# Patient Record
Sex: Male | Born: 1950 | Race: White | Hispanic: No | Marital: Married | Smoking: Former smoker
Health system: Southern US, Community
[De-identification: ages and names within clinical notes are randomized; demographics above are authoritative.]

## PROBLEM LIST (undated history)

## (undated) DIAGNOSIS — E785 Hyperlipidemia, unspecified: Secondary | ICD-10-CM

## (undated) DIAGNOSIS — E559 Vitamin D deficiency, unspecified: Secondary | ICD-10-CM

## (undated) DIAGNOSIS — K219 Gastro-esophageal reflux disease without esophagitis: Secondary | ICD-10-CM

## (undated) DIAGNOSIS — K5730 Diverticulosis of large intestine without perforation or abscess without bleeding: Secondary | ICD-10-CM

## (undated) DIAGNOSIS — D649 Anemia, unspecified: Secondary | ICD-10-CM

## (undated) DIAGNOSIS — H3530 Unspecified macular degeneration: Secondary | ICD-10-CM

## (undated) DIAGNOSIS — J449 Chronic obstructive pulmonary disease, unspecified: Secondary | ICD-10-CM

## (undated) DIAGNOSIS — I10 Essential (primary) hypertension: Secondary | ICD-10-CM

## (undated) DIAGNOSIS — K210 Gastro-esophageal reflux disease with esophagitis: Secondary | ICD-10-CM

## (undated) DIAGNOSIS — F17200 Nicotine dependence, unspecified, uncomplicated: Secondary | ICD-10-CM

## (undated) DIAGNOSIS — I639 Cerebral infarction, unspecified: Secondary | ICD-10-CM

## (undated) DIAGNOSIS — Z8551 Personal history of malignant neoplasm of bladder: Secondary | ICD-10-CM

## (undated) DIAGNOSIS — K6289 Other specified diseases of anus and rectum: Secondary | ICD-10-CM

## (undated) DIAGNOSIS — M1990 Unspecified osteoarthritis, unspecified site: Secondary | ICD-10-CM

## (undated) DIAGNOSIS — R918 Other nonspecific abnormal finding of lung field: Secondary | ICD-10-CM

## (undated) DIAGNOSIS — K2940 Chronic atrophic gastritis without bleeding: Secondary | ICD-10-CM

## (undated) DIAGNOSIS — C801 Malignant (primary) neoplasm, unspecified: Secondary | ICD-10-CM

## (undated) DIAGNOSIS — R011 Cardiac murmur, unspecified: Secondary | ICD-10-CM

## (undated) DIAGNOSIS — Z923 Personal history of irradiation: Secondary | ICD-10-CM

## (undated) HISTORY — DX: Gastro-esophageal reflux disease with esophagitis: K21.0

## (undated) HISTORY — DX: Gastro-esophageal reflux disease without esophagitis: K21.9

## (undated) HISTORY — DX: Essential (primary) hypertension: I10

## (undated) HISTORY — DX: Malignant (primary) neoplasm, unspecified: C80.1

## (undated) HISTORY — DX: Diverticulosis of large intestine without perforation or abscess without bleeding: K57.30

## (undated) HISTORY — DX: Chronic atrophic gastritis without bleeding: K29.40

## (undated) HISTORY — DX: Nicotine dependence, unspecified, uncomplicated: F17.200

## (undated) HISTORY — PX: TONSILLECTOMY: SUR1361

## (undated) HISTORY — DX: Anemia, unspecified: D64.9

## (undated) HISTORY — DX: Unspecified osteoarthritis, unspecified site: M19.90

## (undated) HISTORY — DX: Cerebral infarction, unspecified: I63.9

## (undated) HISTORY — PX: WISDOM TOOTH EXTRACTION: SHX21

## (undated) HISTORY — PX: UPPER GASTROINTESTINAL ENDOSCOPY: SHX188

## (undated) HISTORY — DX: Vitamin D deficiency, unspecified: E55.9

## (undated) HISTORY — DX: Hyperlipidemia, unspecified: E78.5

## (undated) HISTORY — PX: COLONOSCOPY: SHX174

## (undated) HISTORY — DX: Personal history of malignant neoplasm of bladder: Z85.51

---

## 1993-04-16 HISTORY — PX: NASAL SEPTUM SURGERY: SHX37

## 2000-04-16 DIAGNOSIS — Z8551 Personal history of malignant neoplasm of bladder: Secondary | ICD-10-CM

## 2000-04-16 HISTORY — DX: Personal history of malignant neoplasm of bladder: Z85.51

## 2000-04-16 HISTORY — PX: TRANSURETHRAL RESECTION OF BLADDER TUMOR: SHX2575

## 2001-06-23 ENCOUNTER — Ambulatory Visit (HOSPITAL_BASED_OUTPATIENT_CLINIC_OR_DEPARTMENT_OTHER): Admission: RE | Admit: 2001-06-23 | Discharge: 2001-06-23 | Payer: Self-pay | Admitting: *Deleted

## 2001-06-23 ENCOUNTER — Encounter (INDEPENDENT_AMBULATORY_CARE_PROVIDER_SITE_OTHER): Payer: Self-pay | Admitting: Specialist

## 2001-06-23 ENCOUNTER — Encounter: Payer: Self-pay | Admitting: Urology

## 2001-06-23 ENCOUNTER — Encounter (INDEPENDENT_AMBULATORY_CARE_PROVIDER_SITE_OTHER): Payer: Self-pay

## 2001-07-01 ENCOUNTER — Encounter: Payer: Self-pay | Admitting: Urology

## 2001-07-01 ENCOUNTER — Encounter: Admission: RE | Admit: 2001-07-01 | Discharge: 2001-07-01 | Payer: Self-pay | Admitting: Urology

## 2001-07-02 ENCOUNTER — Ambulatory Visit (HOSPITAL_BASED_OUTPATIENT_CLINIC_OR_DEPARTMENT_OTHER): Admission: RE | Admit: 2001-07-02 | Discharge: 2001-07-02 | Payer: Self-pay | Admitting: Urology

## 2002-01-02 ENCOUNTER — Encounter: Payer: Self-pay | Admitting: Urology

## 2002-01-02 ENCOUNTER — Encounter: Admission: RE | Admit: 2002-01-02 | Discharge: 2002-01-02 | Payer: Self-pay | Admitting: Urology

## 2002-01-12 ENCOUNTER — Encounter (INDEPENDENT_AMBULATORY_CARE_PROVIDER_SITE_OTHER): Payer: Self-pay | Admitting: Specialist

## 2002-01-12 ENCOUNTER — Ambulatory Visit (HOSPITAL_BASED_OUTPATIENT_CLINIC_OR_DEPARTMENT_OTHER): Admission: RE | Admit: 2002-01-12 | Discharge: 2002-01-12 | Payer: Self-pay | Admitting: Urology

## 2002-05-22 ENCOUNTER — Encounter (INDEPENDENT_AMBULATORY_CARE_PROVIDER_SITE_OTHER): Payer: Self-pay | Admitting: *Deleted

## 2002-05-22 ENCOUNTER — Ambulatory Visit (HOSPITAL_BASED_OUTPATIENT_CLINIC_OR_DEPARTMENT_OTHER): Admission: RE | Admit: 2002-05-22 | Discharge: 2002-05-22 | Payer: Self-pay | Admitting: Urology

## 2003-08-05 ENCOUNTER — Encounter: Payer: Self-pay | Admitting: Internal Medicine

## 2004-03-02 ENCOUNTER — Ambulatory Visit: Payer: Self-pay | Admitting: Internal Medicine

## 2004-03-30 ENCOUNTER — Ambulatory Visit: Payer: Self-pay | Admitting: Internal Medicine

## 2004-05-03 ENCOUNTER — Ambulatory Visit: Payer: Self-pay | Admitting: Internal Medicine

## 2004-05-17 ENCOUNTER — Ambulatory Visit: Payer: Self-pay | Admitting: Internal Medicine

## 2004-06-01 ENCOUNTER — Ambulatory Visit: Payer: Self-pay | Admitting: Internal Medicine

## 2004-06-01 DIAGNOSIS — K2940 Chronic atrophic gastritis without bleeding: Secondary | ICD-10-CM

## 2004-06-01 DIAGNOSIS — K2100 Gastro-esophageal reflux disease with esophagitis, without bleeding: Secondary | ICD-10-CM

## 2004-06-01 HISTORY — DX: Chronic atrophic gastritis without bleeding: K29.40

## 2004-06-01 HISTORY — DX: Gastro-esophageal reflux disease with esophagitis, without bleeding: K21.00

## 2004-10-03 ENCOUNTER — Emergency Department (HOSPITAL_COMMUNITY): Admission: EM | Admit: 2004-10-03 | Discharge: 2004-10-03 | Payer: Self-pay | Admitting: Emergency Medicine

## 2005-04-11 ENCOUNTER — Ambulatory Visit: Payer: Self-pay | Admitting: Internal Medicine

## 2005-04-19 ENCOUNTER — Ambulatory Visit: Payer: Self-pay | Admitting: Internal Medicine

## 2006-12-11 ENCOUNTER — Ambulatory Visit: Payer: Self-pay | Admitting: Internal Medicine

## 2007-01-09 DIAGNOSIS — K219 Gastro-esophageal reflux disease without esophagitis: Secondary | ICD-10-CM | POA: Insufficient documentation

## 2007-01-09 DIAGNOSIS — E785 Hyperlipidemia, unspecified: Secondary | ICD-10-CM

## 2007-01-09 HISTORY — DX: Gastro-esophageal reflux disease without esophagitis: K21.9

## 2007-01-09 HISTORY — DX: Hyperlipidemia, unspecified: E78.5

## 2007-01-14 DIAGNOSIS — K5730 Diverticulosis of large intestine without perforation or abscess without bleeding: Secondary | ICD-10-CM

## 2007-01-14 DIAGNOSIS — M1990 Unspecified osteoarthritis, unspecified site: Secondary | ICD-10-CM | POA: Insufficient documentation

## 2007-01-14 HISTORY — DX: Diverticulosis of large intestine without perforation or abscess without bleeding: K57.30

## 2007-01-14 HISTORY — DX: Unspecified osteoarthritis, unspecified site: M19.90

## 2007-05-21 ENCOUNTER — Ambulatory Visit: Payer: Self-pay | Admitting: Internal Medicine

## 2007-05-21 LAB — CONVERTED CEMR LAB
ALT: 24 units/L (ref 0–53)
AST: 21 units/L (ref 0–37)
Albumin: 4 g/dL (ref 3.5–5.2)
Alkaline Phosphatase: 71 units/L (ref 39–117)
BUN: 16 mg/dL (ref 6–23)
Basophils Absolute: 0 10*3/uL (ref 0.0–0.1)
Basophils Relative: 0.1 % (ref 0.0–1.0)
Bilirubin Urine: NEGATIVE
Bilirubin, Direct: 0.2 mg/dL (ref 0.0–0.3)
Blood in Urine, dipstick: NEGATIVE
CO2: 29 meq/L (ref 19–32)
Calcium: 9.4 mg/dL (ref 8.4–10.5)
Chloride: 104 meq/L (ref 96–112)
Cholesterol: 181 mg/dL (ref 0–200)
Creatinine, Ser: 0.8 mg/dL (ref 0.4–1.5)
Direct LDL: 86.8 mg/dL
Eosinophils Absolute: 0.1 10*3/uL (ref 0.0–0.6)
Eosinophils Relative: 1.4 % (ref 0.0–5.0)
GFR calc Af Amer: 128 mL/min
GFR calc non Af Amer: 106 mL/min
Glucose, Bld: 98 mg/dL (ref 70–99)
Glucose, Urine, Semiquant: NEGATIVE
HCT: 45.1 % (ref 39.0–52.0)
HDL: 26.4 mg/dL — ABNORMAL LOW (ref >39.0)
Hemoglobin: 15.4 g/dL (ref 13.0–17.0)
Ketones, urine, test strip: NEGATIVE
Lymphocytes Relative: 25.7 % (ref 12.0–46.0)
MCHC: 34.3 g/dL (ref 30.0–36.0)
MCV: 97.5 fL (ref 78.0–100.0)
Monocytes Absolute: 0.3 10*3/uL (ref 0.2–0.7)
Monocytes Relative: 4.9 % (ref 3.0–11.0)
Neutro Abs: 4.5 10*3/uL (ref 1.4–7.7)
Neutrophils Relative %: 67.9 % (ref 43.0–77.0)
Nitrite: NEGATIVE
PSA: 0.5 ng/mL (ref 0.10–4.00)
Platelets: 136 10*3/uL — ABNORMAL LOW (ref 150–400)
Potassium: 4.4 meq/L (ref 3.5–5.1)
RBC: 4.62 M/uL (ref 4.22–5.81)
RDW: 13 % (ref 11.5–14.6)
Sodium: 140 meq/L (ref 135–145)
Specific Gravity, Urine: 1.025
TSH: 1 microintl units/mL (ref 0.35–5.50)
Total Bilirubin: 0.9 mg/dL (ref 0.3–1.2)
Total CHOL/HDL Ratio: 6.9
Total Protein: 6.4 g/dL (ref 6.0–8.3)
Triglycerides: 345 mg/dL (ref 0–149)
Urobilinogen, UA: 0.2
VLDL: 69 mg/dL — ABNORMAL HIGH (ref 0–40)
WBC Urine, dipstick: NEGATIVE
WBC: 6.6 10*3/uL (ref 4.5–10.5)
pH: 6

## 2007-06-01 LAB — CONVERTED CEMR LAB: Vit D, 1,25-Dihydroxy: 18 — ABNORMAL LOW (ref 30–89)

## 2007-06-03 ENCOUNTER — Ambulatory Visit: Payer: Self-pay | Admitting: Internal Medicine

## 2007-06-03 DIAGNOSIS — E559 Vitamin D deficiency, unspecified: Secondary | ICD-10-CM | POA: Insufficient documentation

## 2007-06-03 HISTORY — DX: Vitamin D deficiency, unspecified: E55.9

## 2007-07-18 DIAGNOSIS — Z8551 Personal history of malignant neoplasm of bladder: Secondary | ICD-10-CM | POA: Insufficient documentation

## 2007-12-24 ENCOUNTER — Telehealth: Payer: Self-pay | Admitting: Internal Medicine

## 2008-07-20 HISTORY — PX: KNEE SURGERY: SHX244

## 2009-01-31 ENCOUNTER — Telehealth: Payer: Self-pay | Admitting: Internal Medicine

## 2009-02-09 ENCOUNTER — Ambulatory Visit: Payer: Self-pay | Admitting: Internal Medicine

## 2009-02-09 LAB — CONVERTED CEMR LAB
Albumin: 4.1 g/dL (ref 3.5–5.2)
Basophils Absolute: 0.1 10*3/uL (ref 0.0–0.1)
CO2: 29 meq/L (ref 19–32)
Calcium: 9.2 mg/dL (ref 8.4–10.5)
Chloride: 104 meq/L (ref 96–112)
Direct LDL: 84.2 mg/dL
Eosinophils Absolute: 0.2 10*3/uL (ref 0.0–0.7)
Glucose, Bld: 110 mg/dL — ABNORMAL HIGH (ref 70–99)
HCT: 46.6 % (ref 39.0–52.0)
Hemoglobin: 15.9 g/dL (ref 13.0–17.0)
Ketones, urine, test strip: NEGATIVE
Lymphs Abs: 1.9 10*3/uL (ref 0.7–4.0)
MCHC: 34.2 g/dL (ref 30.0–36.0)
MCV: 100.5 fL — ABNORMAL HIGH (ref 78.0–100.0)
Monocytes Absolute: 0.4 10*3/uL (ref 0.1–1.0)
Neutro Abs: 4.6 10*3/uL (ref 1.4–7.7)
Nitrite: NEGATIVE
Potassium: 4.6 meq/L (ref 3.5–5.1)
RDW: 12.7 % (ref 11.5–14.6)
Sodium: 139 meq/L (ref 135–145)
Specific Gravity, Urine: 1.025
TSH: 0.88 microintl units/mL (ref 0.35–5.50)
Total Protein: 7.1 g/dL (ref 6.0–8.3)
Triglycerides: 378 mg/dL — ABNORMAL HIGH (ref 0.0–149.0)

## 2009-02-16 ENCOUNTER — Ambulatory Visit: Payer: Self-pay | Admitting: Internal Medicine

## 2009-02-16 DIAGNOSIS — F17200 Nicotine dependence, unspecified, uncomplicated: Secondary | ICD-10-CM

## 2009-02-16 HISTORY — DX: Nicotine dependence, unspecified, uncomplicated: F17.200

## 2009-03-18 ENCOUNTER — Ambulatory Visit: Payer: Self-pay | Admitting: Internal Medicine

## 2009-04-21 ENCOUNTER — Telehealth: Payer: Self-pay | Admitting: Internal Medicine

## 2010-03-23 ENCOUNTER — Ambulatory Visit: Payer: Self-pay | Admitting: Internal Medicine

## 2010-03-23 LAB — CONVERTED CEMR LAB
ALT: 20 units/L (ref 0–53)
Albumin: 4.1 g/dL (ref 3.5–5.2)
BUN: 19 mg/dL (ref 6–23)
Basophils Relative: 0.3 % (ref 0.0–3.0)
Bilirubin Urine: NEGATIVE
Bilirubin, Direct: 0.1 mg/dL (ref 0.0–0.3)
CO2: 29 meq/L (ref 19–32)
Chloride: 102 meq/L (ref 96–112)
Cholesterol: 204 mg/dL — ABNORMAL HIGH (ref 0–200)
Creatinine, Ser: 0.6 mg/dL (ref 0.4–1.5)
Eosinophils Absolute: 0.1 10*3/uL (ref 0.0–0.7)
Eosinophils Relative: 1.5 % (ref 0.0–5.0)
HCT: 46.8 % (ref 39.0–52.0)
Ketones, ur: NEGATIVE mg/dL
Leukocytes, UA: NEGATIVE
Lymphs Abs: 2.4 10*3/uL (ref 0.7–4.0)
MCHC: 35.1 g/dL (ref 30.0–36.0)
MCV: 97.9 fL (ref 78.0–100.0)
Monocytes Absolute: 0.4 10*3/uL (ref 0.1–1.0)
Neutrophils Relative %: 68.6 % (ref 43.0–77.0)
Nitrite: NEGATIVE
PSA: 0.8 ng/mL (ref 0.10–4.00)
Potassium: 4.5 meq/L (ref 3.5–5.1)
RBC: 4.78 M/uL (ref 4.22–5.81)
TSH: 1.14 microintl units/mL (ref 0.35–5.50)
Total Protein: 6.8 g/dL (ref 6.0–8.3)
Urobilinogen, UA: 0.2 (ref 0.0–1.0)
pH: 6 (ref 5.0–8.0)

## 2010-03-31 ENCOUNTER — Encounter: Payer: Self-pay | Admitting: Internal Medicine

## 2010-03-31 ENCOUNTER — Ambulatory Visit: Payer: Self-pay | Admitting: Internal Medicine

## 2010-03-31 DIAGNOSIS — R1013 Epigastric pain: Secondary | ICD-10-CM

## 2010-04-05 ENCOUNTER — Ambulatory Visit: Payer: Self-pay | Admitting: Cardiology

## 2010-05-18 NOTE — Assessment & Plan Note (Signed)
Summary: CPX // RS   Vital Signs:  Patient profile:   60 year old male Height:      69.25 inches Weight:      204 pounds BMI:     30.02 Temp:     98.3 degrees F oral BP sitting:   128 / 80  (right arm) Cuff size:   regular  Vitals Entered By: Duard Brady LPN (March 31, 2010 2:46 PM) CC: cpx - doing well  - mid abd pain x79mos on/off Is Patient Diabetic? No   Primary Care Jhoel Stieg:  Gordy Savers  MD  CC:  cpx - doing well  - mid abd pain x80mos on/off.  History of Present Illness:  60 year old patient who is seen today for a health maintenance examination.  Impression includes pancreas seen midabdominal pain over 6 months.  He does have a history of gastritis, but is on chronic omeprazole.  there's been no weight loss, anorexia, or other constitutional complaints  Preventive Screening-Counseling & Management  Alcohol-Tobacco     Smoking Cessation Counseling: yes  Allergies (verified): No Known Drug Allergies  Past History:  Past Medical History: Reviewed history from 03/18/2009 and no changes required. GERD at EGD 2006 gastritis (neg H. pylori) Hyperlipidemia Bladder CA Diverticulosis, colon Osteoarthritis vitamin D deficiency impaired glucose tolerance ED  Past Surgical History: Reviewed history from 02/16/2009 and no changes required. Knee surgery- bilateral colonoscopy 2005 cystoscopy March 2003  Family History: Reviewed history from 03/18/2009 and no changes required. father died age 22, MI, history of type 2 diabetes mother died 27 complications of senile  dementia one brother remains in good health No FH of Colon Cancer: Family History of Heart Disease: maternal grandfather  Social History: Reviewed history from 03/18/2009 and no changes required. Married Current Smoker 1/2 per day Alcohol Use - yes wine with dinner Daily Caffeine Use 2 cups per day Illicit Drug Use - no  Review of Systems  The patient denies anorexia, fever,  weight loss, weight gain, vision loss, decreased hearing, hoarseness, chest pain, syncope, dyspnea on exertion, peripheral edema, prolonged cough, headaches, hemoptysis, abdominal pain, melena, hematochezia, severe indigestion/heartburn, hematuria, incontinence, genital sores, muscle weakness, suspicious skin lesions, transient blindness, difficulty walking, depression, unusual weight change, abnormal bleeding, enlarged lymph nodes, angioedema, breast masses, and testicular masses.    Physical Exam  General:  overweight-appearing.  140/80overweight-appearing.   Head:  Normocephalic and atraumatic without obvious abnormalities. No apparent alopecia or balding. Eyes:  No corneal or conjunctival inflammation noted. EOMI. Perrla. Funduscopic exam benign, without hemorrhages, exudates or papilledema. Vision grossly normal. Ears:  External ear exam shows no significant lesions or deformities.  Otoscopic examination reveals clear canals, tympanic membranes are intact bilaterally without bulging, retraction, inflammation or discharge. Hearing is grossly normal bilaterally. Nose:  External nasal examination shows no deformity or inflammation. Nasal mucosa are pink and moist without lesions or exudates. Mouth:  Oral mucosa and oropharynx without lesions or exudates.  Teeth in good repair. Neck:  No deformities, masses, or tenderness noted. Chest Wall:  No deformities, masses, tenderness or gynecomastia noted. Breasts:  No masses or gynecomastia noted Lungs:  Normal respiratory effort, chest expands symmetrically. Lungs are clear to auscultation, no crackles or wheezes. Heart:  Normal rate and regular rhythm. S1 and S2 normal without gallop, murmur, click, rub or other extra sounds. Abdomen:  Bowel sounds positive,abdomen soft and non-tender without masses, organomegaly or hernias noted. Rectal:  No external abnormalities noted. Normal sphincter tone. No rectal masses or tenderness. Genitalia:  Testes  bilaterally descended without nodularity, tenderness or masses. No scrotal masses or lesions. No penis lesions or urethral discharge. Prostate:  2+ enlarged.  2+ enlarged.   Msk:  No deformity or scoliosis noted of thoracic or lumbar spine.   Pulses:  R and L carotid,radial,femoral,dorsalis pedis and posterior tibial pulses are full and equal bilaterally Extremities:  No clubbing, cyanosis, edema, or deformity noted with normal full range of motion of all joints.   Neurologic:  No cranial nerve deficits noted. Station and gait are normal. Plantar reflexes are down-going bilaterally. DTRs are symmetrical throughout. Sensory, motor and coordinative functions appear intact. Skin:  Intact without suspicious lesions or rashes Cervical Nodes:  No lymphadenopathy noted Axillary Nodes:  No palpable lymphadenopathy Inguinal Nodes:  No significant adenopathy Psych:  Cognition and judgment appear intact. Alert and cooperative with normal attention span and concentration. No apparent delusions, illusions, hallucinations   Impression & Recommendations:  Problem # 1:  PREVENTIVE HEALTH CARE (ICD-V70.0)  Orders: EKG w/ Interpretation (93000)  Complete Medication List: 1)  Lipitor 40 Mg Tabs (Atorvastatin calcium) .Marland Kitchen.. 1 once daily 2)  Niacin 500 Mg Tabs (Niacin) .... Take one by mouth at bedtime 3)  Multivitamins Tabs (Multiple vitamin) .... Take one by mouth once daily 4)  Ibuprofen 200 Mg Caps (Ibuprofen) .... As needed 5)  Cialis 20 Mg Tabs (Tadalafil) .... One daily as directed 6)  Omeprazole 40 Mg Cpdr (Omeprazole) .... Take 1 capsule by mouth once a day 7)  Cialis 5 Mg Tabs (Tadalafil) .... One daily  Other Orders: Radiology Referral (Radiology)  Patient Instructions: 1)  Please schedule a follow-up appointment in 1 year. 2)  Limit your Sodium (Salt). 3)  Avoid foods high in acid (tomatoes, citrus juices, spicy foods). Avoid eating within two hours of lying down or before exercising. Do not  over eat; try smaller more frequent meals. Elevate head of bed twelve inches when sleeping. 4)  Tobacco is very bad for your health and your loved ones! You Should stop smoking!. 5)  It is important that you exercise regularly at least 20 minutes 5 times a week. If you develop chest pain, have severe difficulty breathing, or feel very tired , stop exercising immediately and seek medical attention. 6)  Take calcium +Vitamin D daily. 1000-1500 units daily  Prescriptions: CIALIS 5 MG TABS (TADALAFIL) one daily  #30 x 0   Entered and Authorized by:   Gordy Savers  MD   Signed by:   Gordy Savers  MD on 03/31/2010   Method used:   [facility] to        [facility] [address] (retail)       [address]       [place], [place]  [address]       Ph: [phone] or [phone]       Fax: [phone]   RxID:   [id number] OMEPRAZOLE 40 MG CPDR (OMEPRAZOLE) Take 1 capsule by mouth once a day  #90 x 6   Entered and Authorized by:   Gordy Savers  MD   Signed by:   Gordy Savers  MD on 03/31/2010   Method used:   [facility]ically to        [facility]  [place] [address] (retail[facility]       [address]       [place], [place]  [address]       Ph: [phone] or [phone]  Fax: [phone]   RxID:   [id number] CIALIS 20 MG TABS (TADALAFIL) one daily as directed  #12 x 12   Entered and Authorized by:   Gordy Savers  MD   Signed by:   Gordy Savers  MD on 03/31/2010   Method used:   [facility]        [facility]  [place] [address] [facility]       [address]       [place], [place]  [address]       Ph: [phone] or [phone]       Fax: [phone]   RxID:   [id number] LIPITOR 40 MG TABS (ATORVASTATIN CALCIUM) 1 once daily  #90 x 6   Entered and Authorized by:   Gordy Savers  MD   Signed by:   Gordy Savers  MD on 03/31/2010   Method used:   [facility]        [facility]  [place] [address] [facility]retail[facility]       [address]        [place], [place]  [address]       Ph: [phone] or [phone]       Fax: [phone]   RxID:   [id number]    Orders Added: 1)  EKG w/ Interpretation [93000] 2)  Est. Patient 40-64 years [[address]] 3)  Radiology Referral [Radiology]

## 2010-05-18 NOTE — Progress Notes (Signed)
Summary: Change pantoprazole to omeprazole due to ins.   Phone Note Call from Patient Call back at [phone]   Caller: Patient Call For: Dr. Leone Payor Reason for Call: Refill Medication Summary of Call: Pt was prescribed Pantoprazole and Insurance will not pay for it. The pharmacy is recommending Omeprazole to replace it Initial call taken by: Karna Christmas,  April 21, 2009 9:43 AM  Follow-up for Phone Call        I spoke to pt who is OK to switch to Omeprazole.  Pt requests 40mg .  I advised pt insurance may not pay for 40mg .  Pt will call next week if insurance will not pay for omeprazole 40mg . Follow-up by: Francee Piccolo CMA Duncan Dull),  April 21, 2009 4:20 PM    New/Updated Medications: OMEPRAZOLE 40 MG CPDR (OMEPRAZOLE) Take 1 capsule by mouth once a day Prescriptions: OMEPRAZOLE 40 MG CPDR (OMEPRAZOLE) Take 1 capsule by mouth once a day  #30 x 6   Entered by:   Francee Piccolo CMA (AAMA)   Authorized by:   Iva Boop MD, [facility]   Signed by:   Francee Piccolo CMA (AAMA) on 04/21/2009   Method used:   [facility]        [facility]  [place] [address]       [address]       [place], [place]  [address]       Ph: [phone] or [phone]       Fax: [phone]   RxID:   [id number]

## 2010-08-29 NOTE — Assessment & Plan Note (Signed)
[facility]                         GASTROENTEROLOGY OFFICE NOTE   PRISCILLA, FINKLEA                      MRN:          [id number]  DATE:12/11/2006                            DOB:          [birth year]    CHIEF COMPLAINT:  Gastroesophageal reflux disease.  Needs refill on  Prevacid.   PROBLEMS:  1. Reflux esophagitis, chronic gastritis found endoscopy June 01, 2004.  2. Smoker.  3. Negative screening colonoscopy with minimal sigmoid diverticulosis      April of 2005.  4. Osteoarthritis.  5. History of bladder cancer.  6. Left knee surgery.  7. Dyslipidemia.   MEDICATIONS:  Listed and reviewed on the chart.   He denies any heartburn, dysphagia, weight loss, or problems.  He is  using Prevacid every other day with excellent control of his symptoms.  Weight 198 pounds.  Pulse 80, blood pressure 132/74.   He still smokes.  Only one cup of coffee a day.   ASSESSMENT:  1. Gastroesophageal reflux disease, reflux esophagitis.  2. Smoker.   RECOMMENDATIONS AND PLAN:  1. Refill the Prevacid.  As long as he does not have dysphagia,      heartburn, or breakthrough problems, we can refill this for 2 years      at this time I think.  2. He should stop smoking.  He knows this.  He says he has cut back.      He is going to discuss Chantix with Dr. Amador Cunas at his next      visit.     Iva Boop, MD,FACG  Electronically Signed    CEG/MedQ  DD: 12/11/2006  DT: 12/12/2006  Job #: [id number]   cc:   Gordy Savers, MD

## 2010-09-01 NOTE — Op Note (Signed)
[facility]  Patient:    Jesse [name], Jesse [name] Visit Number: [id number] MRN: [id number]          Service Type: NES Location: [facility] Attending Physician:  Lauree Chandler Dictated by:   Maretta Bees. Vonita Moss, M.D. Proc. Date: 07/02/01 Admit Date:  06/23/2001 Discharge Date: 06/23/2001                             Operative Report  PREOPERATIVE DIAGNOSIS:  Left hydronephrosis.  POSTOPERATIVE DIAGNOSIS:  Left hydronephrosis.  PROCEDURE: 1. Cystoscopy. 2. Left retrograde pyelogram with interpretation. 3. Insertion of left Double J catheter.  SURGEON:  Maretta Bees. Vonita Moss, M.D.  ANESTHESIA:  General.  INDICATIONS:  This 60 year old gentleman had gross hematuria for several days and underwent cystoscopy and resection of a small bladder tumor just beyond the left ureteral orifice on the bladder floor and also underwent retrograde pyelograms and ended up with a left ureteroscopy because of an area that looked irregular in the distal left ureter and with balloon dilation, he had a small area in the distal left ureter that caused a persistent waisting in the balloon dilator. Urine cytologies were negative. He had his Double J catheter removed a couple of days postoperatively and he has had a week of left flank pain requiring Tylox for relief. In the office yesterday, March 18, he had an ultrasound that showed hydronephrosis of the left kidney and then a CT scan was done which showed left hydronephrosis and what appeared to be edema in the bladder near the distal left ureter. Because of pain and persistent obstruction and concern about a ureteral stricture, I brought him back to the OR today.  DESCRIPTION OF PROCEDURE:  The patient was brought to the operating room and placed in lithotomy position. He was cystoscoped and there was tremendous inflammation and edema around the left ureteral orifice and around the biopsy which is nearby. I used a Glidewire M to  catheterize the left ureteral orifice and then an open-ended ureteral catheter was inserted over the guidewire and a retrograde pyelogram confirmed moderate hydronephrosis. I then inserted a metal guidewire. With the metal guidewire in place, I inserted a 26 cm 6-French Endo-Sof ureteral Double J catheter. It had a full coil in the renal pelvis and a full coil in the bladder. The bladder was emptied, the scope removed, and the patient sent to the recovery room in good condition. I feel like the obstruction was superficial due to mucosal edema in the bladder wall, and hopefully, not due to a ureteral stricture. Plan to leave the Double J catheter in for several days. Dictated by:   Maretta Bees. Vonita Moss, M.D. Attending Physician:  Lauree Chandler DD:  07/02/01 TD:  07/02/01 Job: [id number]

## 2010-09-01 NOTE — Op Note (Signed)
   NAME:  Jesse [name], Jesse [name]                         ACCOUNT NO.:  [id number]   MEDICAL RECORD NO.:  [id number]                   PATIENT TYPE:  AMB   LOCATION:  [facility]                                 FACILITY:  [facility]   PHYSICIAN:  Maretta Bees. Vonita Moss, M.D.             DATE OF BIRTH:  [birth year]   DATE OF PROCEDURE:  05/22/2002  DATE OF DISCHARGE:                                 OPERATIVE REPORT   PREOPERATIVE DIAGNOSIS:  Rule out bladder carcinoma.   POSTOPERATIVE DIAGNOSIS:  Rule out bladder carcinoma.   PROCEDURE:  1. Cystoscopy.  2. Cold cup bladder biopsy.   SURGEON:  Maretta Bees. Vonita Moss, M.D.   ANESTHESIA:  General.   INDICATIONS:  This 60 year old white male had a bladder carcinoma resected  in March 2003, and has subsequently undergone extensive work-up with CT  scans, retrograde pyelograms, and ureteroscopies looking for upper tract  disease, but all biopsies and FISH tests have been negative in the upper  tracts, although he has had positive FISH tests from the bladder.  He is  brought to the OR today for further evaluation and random biopsies of the  bladder.   DESCRIPTION OF PROCEDURE:  The patient was brought to the operating room and  placed in lithotomy position.  External genitalia were prepped and draped in  the usual fashion.  He was cystoscoped.  The anterior urethra was normal.  The prostatic urethra was unremarkable.  The bladder was completely  negative, but I felt obligated to do biopsies of the bladder despite that  because of the positive FISH test, and therefore I did random bladder  biopsies with the cold cup bladder biopsy forceps from the dome, base, and  right and left walls for a total of four biopsies.  The biopsy sites were  fulgurated with the Bugbee electrode.  There was no blood loss and good  hemostasis, and the scope was removed and the patient sent to the recovery  room in good condition.                                               Maretta Bees.  Vonita Moss, M.D.    LJP/MEDQ  D:  05/22/2002  T:  05/22/2002  Job:  [id number]

## 2010-09-01 NOTE — Op Note (Signed)
[facility]  Patient:    ARMANY, MANO Visit Number: [id number] MRN: [id number]          Service Type: NES Location: [facility] Attending Physician:  Lauree Chandler Dictated by:   Maretta Bees. Vonita Moss, M.D. Proc. Date: 06/23/01 Admit Date:  06/23/2001   CC:         Gordy Savers, M.D.   Operative Report  PREOPERATIVE DIAGNOSES: 1. Hematuria. 2. Bladder carcinoma. 3. Rule out ureteral carcinoma.  POSTOPERATIVE DIAGNOSES: 1. Hematuria. 2. Bladder carcinoma. 3. Rule out ureteral carcinoma. 4. Left ureteral stricture.  SURGEON:  Maretta Bees. Vonita Moss, M.D.  ANESTHESIA:  General.  INDICATIONS:  This 60 year old white male has had a long history of gross hematuria for several days before I saw him on June 18, 2001.  Renal ultrasound revealed no obvious upper tract lesions.  He underwent cystoscopy, and a small papillary tumor on the left bladder floor was noted.  He was brought to the OR today for further evaluation, and obtained retrograde pyelograms and a bladder biopsy.  DESCRIPTION OF PROCEDURE:  The patient was brought to the operating room and placed in the lithotomy position.  External genitalia were prepped and draped in the usual fashion.  He was cystoscoped, and the anterior and prostatic urethra were unremarkable.  The only lesion in the bladder was a small papillary tumor on the left floor just beyond the trigone which was removed completely with a cold cup bladder biopsy forceps and the base fulgurated with the Bugbee electrode.  I then performed a right retrograde pyelogram with the bulb tip catheter, and upper tracts were unremarkable.  No obstruction or filling defects.  I then performed a left retrograde pyelogram, and I was concerned about a short area of irregularity in the distal ureter. Pyelocaliceal system looked normal.  I inserted a guidewire up the left ureter without difficulty and then inserted the rigid  ureteroscope and about 3 cm into the ureter, there was a tight area that I could not negotiate.  I was unable to dilate this area with a ureteral access sheath.  I then used a 4 cm balloon dilator and even going up to 18-20 cm of water pressure, there was still some waisting at this area of irregularity.  I then reinserted the ureteroscope, and fortunately I had dilated this area enough to get beyond this tight area, and I saw no papillary tumors, stones, or any other lesions. Nevertheless, I obtained some urine for cytology from this region and decided to leave in a 6 French 26 cm double-J catheter with a string attached which was brought out per urethra.  The patient tolerated the procedure well. Dictated by:   Maretta Bees. Vonita Moss, M.D. Attending Physician:  Lauree Chandler DD:  06/23/01 TD:  06/23/01 Job: [id number]

## 2010-09-01 NOTE — Op Note (Signed)
NAME:  Jesse [name], Jesse [name]                         ACCOUNT NO.:  [id number]   MEDICAL RECORD NO.:  [id number]                   PATIENT TYPE:  AMB   LOCATION:  [facility]                                 FACILITY:  [facility]   PHYSICIAN:  Maretta Bees. Vonita Moss, M.D.             DATE OF BIRTH:  1950/11/02   DATE OF PROCEDURE:  01/12/2002  DATE OF DISCHARGE:                                 OPERATIVE REPORT   PREOPERATIVE DIAGNOSIS:  Rule out recurrent bladder carcinoma or upper tract  transitional cell carcinoma.   PROCEDURES:  1. Cystoscopy.  2. Cold-cup bladder biopsy.  3. Bilateral retrograde pyelograms with interpretation.  4. Bilateral collection of urines for cancer screening.  5. Bilateral ureteroscopy.  6. Bilateral double J catheter placement.   SURGEON:  Maretta Bees. Vonita Moss, M.D.   ANESTHESIA:  General.   INDICATIONS:  This 60 year old white male had a small bladder cancer removed  in March of this year, and he has been worked up extensively and especially  lately for some right flank pain and gross hematuria.  He had a question of  a distal left ureteral lesion in the past and recently had a positive  history of FISH test.  He is brought to the OR today for further evaluation  of his upper and lower urinary tracts.   DESCRIPTION OF PROCEDURE:  The patient was brought to the operating room and  placed in lithotomy position, and external genitalia were prepped and draped  in the usual fashion.  He was cystoscoped.  His anterior urethra was normal.  The prostate had a partial obstruction and a slightly high bladder neck.  The bladder had no erythematous lesions or papillary tumors, and he  subsequently underwent cold-cup bladder biopsy of each bladder wall, and  fulguration of these biopsy sites was completed.  He ended up undergoing  bilateral retrograde pyelograms that showed no filling defects or  obstruction of either pyelocalyceal system or ureter.  Before injection of  contrast was  performed using 5 [place] whistle-tip catheters in each renal  pelvis, saline washings were obtained differentially for FISH testing.  Bilateral ureteroscopies were performed, and specifically after placing a  guidewire up the left ureter, I used the 6 [place] rigid scope because there  was concern about distal left irregularity in the past and I wanted to scope  him without dilating the distal ureter as required at the previous  ureteroscopy.  I was able to negotiate the bony pelvic ureter with the rigid  ureteroscope, and there were no intraureteral polyps, papillary tumors, or  any other erythematous or worrisome lesions.  I could then negotiate the  rigid scope all the way up to the renal pelvis, so I inserted a guidewire  and at first could not insert the 6 [place] flexible scope over the guidewire  and I had to insert a ureteral dilating sheath, and I was able to  ureteroscope him  and there was no ureteral lesion, the renal pelvis had no  lesions in it, and I was able to scope almost all of his multiple calyces  and saw no lesions there.  When I put up the double J catheter later on, I  had to use a Glidewire to negotiate the UPJ, but I ended up placing a 6  [place], 26 cm double J catheter in good position with a string brought out  per urethra.  On the right side I had to dilate the distal ureter with the  dilating sheath.  It was tight, and I saw no ureteral lesions and got a good  view of the right pyelocalyceal system and no lesions were seen there.  I  did leave up a double J catheter on that side in addition, and the bladder  was then emptied, the scope removed, and both strings attached to the double  J catheters were brought out per urethra and taped to the penis.  He was  then taken to the recovery room in good condition.                                               Maretta Bees. Vonita Moss, M.D.    LJP/MEDQ  D:  01/12/2002  T:  01/13/2002  Job:  [id number]

## 2010-09-18 ENCOUNTER — Other Ambulatory Visit: Payer: Self-pay | Admitting: Internal Medicine

## 2010-11-01 ENCOUNTER — Other Ambulatory Visit: Payer: Self-pay | Admitting: Internal Medicine

## 2010-11-01 NOTE — Telephone Encounter (Signed)
Last seen 12/20122 - at that time cilais 5mg  on med list qd #30  0RF , this erequest is for 20mg   Please advise

## 2010-11-02 NOTE — Telephone Encounter (Signed)
ok 

## 2011-06-07 ENCOUNTER — Other Ambulatory Visit: Payer: Self-pay | Admitting: Internal Medicine

## 2011-06-28 ENCOUNTER — Other Ambulatory Visit (INDEPENDENT_AMBULATORY_CARE_PROVIDER_SITE_OTHER): Payer: 59

## 2011-06-28 DIAGNOSIS — Z0000 Encounter for general adult medical examination without abnormal findings: Secondary | ICD-10-CM

## 2011-06-28 LAB — CBC WITH DIFFERENTIAL/PLATELET
Basophils Absolute: 0 10*3/uL (ref 0.0–0.1)
Eosinophils Relative: 2.2 % (ref 0.0–5.0)
MCV: 97.8 fl (ref 78.0–100.0)
Monocytes Absolute: 0.3 10*3/uL (ref 0.1–1.0)
Monocytes Relative: 5.2 % (ref 3.0–12.0)
Neutrophils Relative %: 58.3 % (ref 43.0–77.0)
Platelets: 111 10*3/uL — ABNORMAL LOW (ref 150.0–400.0)
WBC: 5.6 10*3/uL (ref 4.5–10.5)

## 2011-06-28 LAB — LIPID PANEL
HDL: 45.7 mg/dL (ref >39.00)
Total CHOL/HDL Ratio: 4
VLDL: 31.4 mg/dL (ref 0.0–40.0)

## 2011-06-28 LAB — POCT URINALYSIS DIPSTICK
Bilirubin, UA: NEGATIVE
Glucose, UA: NEGATIVE
Leukocytes, UA: NEGATIVE
Nitrite, UA: NEGATIVE

## 2011-06-28 LAB — BASIC METABOLIC PANEL
BUN: 18 mg/dL (ref 6–23)
Creatinine, Ser: 0.8 mg/dL (ref 0.4–1.5)
GFR: 99.81 mL/min (ref >60.00)

## 2011-06-28 LAB — HEPATIC FUNCTION PANEL
ALT: 20 U/L (ref 0–53)
Bilirubin, Direct: 0.1 mg/dL (ref 0.0–0.3)
Total Bilirubin: 0.6 mg/dL (ref 0.3–1.2)

## 2011-06-28 LAB — PSA: PSA: 0.71 ng/mL (ref 0.10–4.00)

## 2011-06-29 ENCOUNTER — Encounter: Payer: Self-pay | Admitting: Internal Medicine

## 2011-07-05 ENCOUNTER — Encounter: Payer: Self-pay | Admitting: Internal Medicine

## 2011-07-05 ENCOUNTER — Ambulatory Visit (INDEPENDENT_AMBULATORY_CARE_PROVIDER_SITE_OTHER): Payer: 59 | Admitting: Internal Medicine

## 2011-07-05 VITALS — BP 120/80 | HR 82 | Temp 98.2°F | Resp 18 | Ht 69.0 in | Wt 189.0 lb

## 2011-07-05 DIAGNOSIS — M1990 Unspecified osteoarthritis, unspecified site: Secondary | ICD-10-CM

## 2011-07-05 DIAGNOSIS — Z0000 Encounter for general adult medical examination without abnormal findings: Secondary | ICD-10-CM

## 2011-07-05 DIAGNOSIS — F17200 Nicotine dependence, unspecified, uncomplicated: Secondary | ICD-10-CM

## 2011-07-05 DIAGNOSIS — E785 Hyperlipidemia, unspecified: Secondary | ICD-10-CM

## 2011-07-05 MED ORDER — ATORVASTATIN CALCIUM 40 MG PO TABS: 40.0000 mg | ORAL_TABLET | Freq: Every day | ORAL | Status: DC

## 2011-07-05 MED ORDER — OMEPRAZOLE 40 MG PO CPDR: 40.0000 mg | DELAYED_RELEASE_CAPSULE | Freq: Every day | ORAL | Status: DC

## 2011-07-05 MED ORDER — TADALAFIL 20 MG PO TABS: 20.0000 mg | ORAL_TABLET | Freq: Every day | ORAL | Status: DC | PRN

## 2011-07-05 NOTE — Progress Notes (Signed)
Subjective:    Patient ID: Jesse [name], male    DOB: 07-Oct-1950, 61 y.o.   MRN: [id number]  HPI  61 year old patient who is seen today for an annual health examination. He is doing quite well. He has been exercising regularly and moderate in his diet with some excellent weight loss. In general he feels quite well Laboratory studies were reviewed   Past Medical History:  Reviewed history from 03/18/2009 and no changes required.  GERD at EGD 2006  gastritis (neg H. pylori)  Hyperlipidemia  Bladder CA  Diverticulosis, colon  Osteoarthritis  vitamin D deficiency  impaired glucose tolerance  ED   Past Surgical History:  Reviewed history from 02/16/2009 and no changes required.  Knee surgery- bilateral  colonoscopy 2005  cystoscopy March 2003   Family History:  Reviewed history from 03/18/2009 and no changes required.  father died age 67, MI, history of type 2 diabetes  mother died 42 complications of senile dementia  one brother remains in good health  No FH of Colon Cancer:  Family History of Heart Disease: maternal grandfather   Social History:  Reviewed history from 03/18/2009 and no changes required.  Married  Current Smoker 1/2 per day  Alcohol Use - yes wine with dinner  Daily Caffeine Use 2 cups per day  Illicit Drug Use - no    Review of Systems  Constitutional: Negative for fever, chills, activity change, appetite change and fatigue.  HENT: Negative for hearing loss, ear pain, congestion, rhinorrhea, sneezing, mouth sores, trouble swallowing, neck pain, neck stiffness, dental problem, voice change, sinus pressure and tinnitus.   Eyes: Negative for photophobia, pain, redness and visual disturbance.  Respiratory: Negative for apnea, cough, choking, chest tightness, shortness of breath and wheezing.   Cardiovascular: Negative for chest pain, palpitations and leg swelling.  Gastrointestinal: Negative for nausea, vomiting, abdominal pain, diarrhea, constipation,  blood in stool, abdominal distention, anal bleeding and rectal pain.  Genitourinary: Negative for dysuria, urgency, frequency, hematuria, flank pain, decreased urine volume, discharge, penile swelling, scrotal swelling, difficulty urinating, genital sores and testicular pain.  Musculoskeletal: Negative for myalgias, back pain, joint swelling, arthralgias and gait problem.  Skin: Negative for color change, rash and wound.  Neurological: Negative for dizziness, tremors, seizures, syncope, facial asymmetry, speech difficulty, weakness, light-headedness, numbness and headaches.  Hematological: Negative for adenopathy. Does not bruise/bleed easily.  Psychiatric/Behavioral: Negative for suicidal ideas, hallucinations, behavioral problems, confusion, sleep disturbance, self-injury, dysphoric mood, decreased concentration and agitation. The patient is not nervous/anxious.        Objective:   Physical Exam  Constitutional: He appears well-developed and well-nourished.  HENT:  Head: Normocephalic and atraumatic.  Right Ear: External ear normal.  Left Ear: External ear normal.  Nose: Nose normal.  Mouth/Throat: Oropharynx is clear and moist.  Eyes: Conjunctivae and EOM are normal. Pupils are equal, round, and reactive to light. No scleral icterus.  Neck: Normal range of motion. Neck supple. No JVD present. No thyromegaly present.  Cardiovascular: Regular rhythm, normal heart sounds and intact distal pulses.  Exam reveals no gallop and no friction rub.   No murmur heard. Pulmonary/Chest: Effort normal and breath sounds normal. He exhibits no tenderness.  Abdominal: Soft. Bowel sounds are normal. He exhibits no distension and no mass. There is no tenderness.  Genitourinary: Prostate normal and penis normal. Guaiac negative stool.  Musculoskeletal: Normal range of motion. He exhibits no edema and no tenderness.  Lymphadenopathy:    He has no cervical adenopathy.  Neurological: He  is alert. He has  normal reflexes. No cranial nerve deficit. Coordination normal.  Skin: Skin is warm and dry. No rash noted.  Psychiatric: He has a normal mood and affect. His behavior is normal.          Assessment & Plan:   Preventive health examination Tobacco use. Total smoking cessation encouraged Dyslipidemia stable with improved HDL. Continue lifestyle management. The patient is exercising regularly and there has been some modest weight loss

## 2011-07-05 NOTE — Patient Instructions (Signed)
Limit your sodium (Salt) intake    It is important that you exercise regularly, at least 20 minutes 3 to 4 times per week.  If you develop chest pain or shortness of breath seek  medical attention.  Smoking tobacco is very bad for your health. You should stop smoking immediately.  Return in one year for follow-up  

## 2011-07-05 NOTE — Progress Notes (Signed)
  Subjective:    Patient ID: PANCHO RUSHING, male    DOB: February 02, 1951, 61 y.o.   MRN: [id number]  HPI Wt Readings from Last 3 Encounters:  07/05/11 189 lb (85.73 kg)  03/31/10 204 lb (92.534 kg)  03/18/09 206 lb 12.8 oz (93.804 kg)     Review of Systems     Objective:   Physical Exam        Assessment & Plan:

## 2012-07-17 ENCOUNTER — Other Ambulatory Visit: Payer: Self-pay | Admitting: Internal Medicine

## 2012-09-05 ENCOUNTER — Telehealth: Payer: Self-pay | Admitting: Internal Medicine

## 2012-09-05 MED ORDER — OMEPRAZOLE 40 MG PO CPDR: DELAYED_RELEASE_CAPSULE | ORAL | Status: DC

## 2012-09-05 MED ORDER — TADALAFIL 20 MG PO TABS: 20.0000 mg | ORAL_TABLET | Freq: Every day | ORAL | Status: DC | PRN

## 2012-09-05 MED ORDER — ATORVASTATIN CALCIUM 40 MG PO TABS: ORAL_TABLET | ORAL | Status: DC

## 2012-09-05 NOTE — Telephone Encounter (Signed)
Spoke to pt told him can only send enough refill till appt, must keep appt on 6/27. Pt verbalized understanding.

## 2012-09-05 NOTE — Telephone Encounter (Signed)
PT needs the following medications refilled prior to his 10/10/12 physical; omeprazole (PRILOSEC) 40 MG capsule, atorvastatin (LIPITOR) 40 MG tablet, and tadalafil (CIALIS) 20 MG tablet. He would like them called into the [facility] in [place]. Please assist.

## 2012-09-30 ENCOUNTER — Ambulatory Visit (INDEPENDENT_AMBULATORY_CARE_PROVIDER_SITE_OTHER): Payer: 59 | Admitting: Internal Medicine

## 2012-09-30 ENCOUNTER — Encounter: Payer: Self-pay | Admitting: Internal Medicine

## 2012-09-30 ENCOUNTER — Other Ambulatory Visit (INDEPENDENT_AMBULATORY_CARE_PROVIDER_SITE_OTHER): Payer: 59

## 2012-09-30 DIAGNOSIS — Z0000 Encounter for general adult medical examination without abnormal findings: Secondary | ICD-10-CM

## 2012-09-30 DIAGNOSIS — E785 Hyperlipidemia, unspecified: Secondary | ICD-10-CM

## 2012-09-30 LAB — CBC WITH DIFFERENTIAL/PLATELET
Basophils Absolute: 0 10*3/uL (ref 0.0–0.1)
Basophils Relative: 0.3 % (ref 0.0–3.0)
Eosinophils Absolute: 0.1 10*3/uL (ref 0.0–0.7)
MCHC: 34.3 g/dL (ref 30.0–36.0)
MCV: 97.6 fl (ref 78.0–100.0)
Monocytes Absolute: 0.4 10*3/uL (ref 0.1–1.0)
Neutro Abs: 5.2 10*3/uL (ref 1.4–7.7)
Neutrophils Relative %: 67.4 % (ref 43.0–77.0)
RBC: 4.71 Mil/uL (ref 4.22–5.81)
RDW: 14.3 % (ref 11.5–14.6)

## 2012-09-30 LAB — HEPATIC FUNCTION PANEL
ALT: 18 U/L (ref 0–53)
Alkaline Phosphatase: 67 U/L (ref 39–117)
Bilirubin, Direct: 0.1 mg/dL (ref 0.0–0.3)
Total Bilirubin: 0.7 mg/dL (ref 0.3–1.2)
Total Protein: 6.4 g/dL (ref 6.0–8.3)

## 2012-09-30 LAB — LIPID PANEL
HDL: 38.2 mg/dL — ABNORMAL LOW (ref >39.00)
Total CHOL/HDL Ratio: 5
VLDL: 57.4 mg/dL — ABNORMAL HIGH (ref 0.0–40.0)

## 2012-09-30 LAB — BASIC METABOLIC PANEL
CO2: 25 mEq/L (ref 19–32)
Chloride: 106 mEq/L (ref 96–112)
Creatinine, Ser: 0.8 mg/dL (ref 0.4–1.5)
Potassium: 4.8 mEq/L (ref 3.5–5.1)
Sodium: 143 mEq/L (ref 135–145)

## 2012-09-30 LAB — POCT URINALYSIS DIPSTICK
Bilirubin, UA: NEGATIVE
Ketones, UA: NEGATIVE
pH, UA: 6.5

## 2012-10-01 LAB — LDL CHOLESTEROL, DIRECT: Direct LDL: 105.6 mg/dL

## 2012-10-07 ENCOUNTER — Ambulatory Visit (INDEPENDENT_AMBULATORY_CARE_PROVIDER_SITE_OTHER): Payer: 59 | Admitting: Internal Medicine

## 2012-10-07 ENCOUNTER — Encounter: Payer: Self-pay | Admitting: Internal Medicine

## 2012-10-07 VITALS — BP 150/80 | HR 69 | Temp 98.6°F | Resp 20 | Ht 69.0 in | Wt 195.0 lb

## 2012-10-07 DIAGNOSIS — Z0000 Encounter for general adult medical examination without abnormal findings: Secondary | ICD-10-CM

## 2012-10-07 DIAGNOSIS — E785 Hyperlipidemia, unspecified: Secondary | ICD-10-CM

## 2012-10-07 DIAGNOSIS — F17200 Nicotine dependence, unspecified, uncomplicated: Secondary | ICD-10-CM

## 2012-10-07 DIAGNOSIS — Z23 Encounter for immunization: Secondary | ICD-10-CM

## 2012-10-07 DIAGNOSIS — M1990 Unspecified osteoarthritis, unspecified site: Secondary | ICD-10-CM

## 2012-10-07 NOTE — Patient Instructions (Addendum)
It is important that you exercise regularly, at least 20 minutes 3 to 4 times per week.  If you develop chest pain or shortness of breath seek  medical attention.  Smoking tobacco is very bad for your health. You should stop smoking immediately.  Return in one year for follow-up

## 2012-10-07 NOTE — Progress Notes (Signed)
Patient ID: Jesse [name], male   DOB: 04-09-1951, 62 y.o.   MRN: [id number]  Subjective:    Patient ID: Jesse [name], male    DOB: 07-10-1950, 62 y.o.   MRN: [id number]  HPI  62 year old patient who is seen today for an annual health examination. He is doing quite well. He has been exercising regularly and moderate in his diet with some excellent weight loss. In general he feels quite well Laboratory studies were reviewed  Wt Readings from Last 3 Encounters:  10/07/12 195 lb (88.451 kg)  07/05/11 189 lb (85.73 kg)  03/31/10 204 lb (92.534 kg)     Past Medical History:   GERD at EGD 2006  gastritis (neg H. pylori)  Hyperlipidemia  Bladder CA  Diverticulosis, colon  Osteoarthritis  vitamin D deficiency  impaired glucose tolerance  ED   Past Surgical History:   Knee surgery- bilateral  colonoscopy 2005  cystoscopy March 2003   Family History:   father died age 35, MI, history of type 2 diabetes  mother died 51 complications of senile dementia  one brother remains in good health  No FH of Colon Cancer:  Family History of Heart Disease: maternal grandfather   Social History:   Married  Current Smoker 1/2 per day  Alcohol Use - yes wine with dinner  Daily Caffeine Use 2 cups per day  Illicit Drug Use - no    Review of Systems  Constitutional: Negative for fever, chills, activity change, appetite change and fatigue.  HENT: Negative for hearing loss, ear pain, congestion, rhinorrhea, sneezing, mouth sores, trouble swallowing, neck pain, neck stiffness, dental problem, voice change, sinus pressure and tinnitus.   Eyes: Negative for photophobia, pain, redness and visual disturbance.  Respiratory: Negative for apnea, cough, choking, chest tightness, shortness of breath and wheezing.   Cardiovascular: Negative for chest pain, palpitations and leg swelling.  Gastrointestinal: Negative for nausea, vomiting, abdominal pain, diarrhea, constipation, blood in stool,  abdominal distention, anal bleeding and rectal pain.  Genitourinary: Negative for dysuria, urgency, frequency, hematuria, flank pain, decreased urine volume, discharge, penile swelling, scrotal swelling, difficulty urinating, genital sores and testicular pain.  Musculoskeletal: Negative for myalgias, back pain, joint swelling, arthralgias and gait problem.  Skin: Negative for color change, rash and wound.  Neurological: Negative for dizziness, tremors, seizures, syncope, facial asymmetry, speech difficulty, weakness, light-headedness, numbness and headaches.  Hematological: Negative for adenopathy. Does not bruise/bleed easily.  Psychiatric/Behavioral: Negative for suicidal ideas, hallucinations, behavioral problems, confusion, sleep disturbance, self-injury, dysphoric mood, decreased concentration and agitation. The patient is not nervous/anxious.        Objective:   Physical Exam  Constitutional: He appears well-developed and well-nourished.  HENT:  Head: Normocephalic and atraumatic.  Right Ear: External ear normal.  Left Ear: External ear normal.  Nose: Nose normal.  Mouth/Throat: Oropharynx is clear and moist.  Eyes: Conjunctivae and EOM are normal. Pupils are equal, round, and reactive to light. No scleral icterus.  Neck: Normal range of motion. Neck supple. No JVD present. No thyromegaly present.  Cardiovascular: Regular rhythm, normal heart sounds and intact distal pulses.  Exam reveals no gallop and no friction rub.   No murmur heard. Pulmonary/Chest: Effort normal and breath sounds normal. He exhibits no tenderness.  Abdominal: Soft. Bowel sounds are normal. He exhibits no distension and no mass. There is no tenderness.  Genitourinary: Prostate normal and penis normal. Guaiac negative stool.  Musculoskeletal: Normal range of motion. He exhibits no edema and no tenderness.  Status post bilateral total knee replacement  Lymphadenopathy:    He has no cervical adenopathy.   Neurological: He is alert. He has normal reflexes. No cranial nerve deficit. Coordination normal.  Skin: Skin is warm and dry. No rash noted.  Psychiatric: He has a normal mood and affect. His behavior is normal.          Assessment & Plan:   Preventive health examination Tobacco use. Total smoking cessation encouraged Dyslipidemia stable with improved HDL. Continue lifestyle management. The patient is exercising regularly and there has been some modest weight loss

## 2013-02-07 ENCOUNTER — Other Ambulatory Visit: Payer: Self-pay | Admitting: Internal Medicine

## 2013-03-05 ENCOUNTER — Other Ambulatory Visit: Payer: Self-pay | Admitting: Internal Medicine

## 2013-06-08 NOTE — Progress Notes (Signed)
   Subjective:    Patient ID: Jesse [name], male    DOB: September 14, 1950, 63 y.o.   MRN: [id number]  HPI    Review of Systems     Objective:   Physical Exam        Assessment & Plan:  Pt not seen

## 2013-09-10 ENCOUNTER — Telehealth: Payer: Self-pay | Admitting: Internal Medicine

## 2013-09-10 MED ORDER — OMEPRAZOLE 40 MG PO CPDR: DELAYED_RELEASE_CAPSULE | ORAL | Status: DC

## 2013-09-10 NOTE — Telephone Encounter (Signed)
Rx sent 

## 2013-09-10 NOTE — Telephone Encounter (Signed)
[facility] is requesting re-fill on omeprazole (PRILOSEC) 40 MG capsule

## 2013-12-29 ENCOUNTER — Other Ambulatory Visit (INDEPENDENT_AMBULATORY_CARE_PROVIDER_SITE_OTHER): Payer: 59

## 2013-12-29 DIAGNOSIS — Z0000 Encounter for general adult medical examination without abnormal findings: Secondary | ICD-10-CM

## 2013-12-29 LAB — BASIC METABOLIC PANEL
BUN: 18 mg/dL (ref 6–23)
CALCIUM: 9.2 mg/dL (ref 8.4–10.5)
CO2: 28 meq/L (ref 19–32)
Chloride: 104 mEq/L (ref 96–112)
Creatinine, Ser: 0.7 mg/dL (ref 0.4–1.5)
GFR: 113.03 mL/min (ref >60.00)
GLUCOSE: 103 mg/dL — AB (ref 70–99)
Potassium: 4.2 mEq/L (ref 3.5–5.1)
Sodium: 140 mEq/L (ref 135–145)

## 2013-12-29 LAB — POCT URINALYSIS DIPSTICK
Bilirubin, UA: NEGATIVE
Glucose, UA: NEGATIVE
Ketones, UA: NEGATIVE
Leukocytes, UA: NEGATIVE
NITRITE UA: NEGATIVE
PH UA: 5.5
Spec Grav, UA: 1.02
UROBILINOGEN UA: 0.2

## 2013-12-29 LAB — HEPATIC FUNCTION PANEL
ALBUMIN: 3.8 g/dL (ref 3.5–5.2)
ALT: 24 U/L (ref 0–53)
AST: 26 U/L (ref 0–37)
Alkaline Phosphatase: 71 U/L (ref 39–117)
BILIRUBIN TOTAL: 0.7 mg/dL (ref 0.2–1.2)
Bilirubin, Direct: 0.1 mg/dL (ref 0.0–0.3)
Total Protein: 6.5 g/dL (ref 6.0–8.3)

## 2013-12-29 LAB — LDL CHOLESTEROL, DIRECT: Direct LDL: 135.3 mg/dL

## 2013-12-29 LAB — LIPID PANEL
Cholesterol: 245 mg/dL — ABNORMAL HIGH (ref 0–200)
HDL: 35.3 mg/dL — AB (ref >39.00)
NONHDL: 209.7
Total CHOL/HDL Ratio: 7
Triglycerides: 432 mg/dL — ABNORMAL HIGH (ref 0.0–149.0)
VLDL: 86.4 mg/dL — ABNORMAL HIGH (ref 0.0–40.0)

## 2013-12-29 LAB — TSH: TSH: 2.01 u[IU]/mL (ref 0.35–4.50)

## 2013-12-29 LAB — PSA: PSA: 0.88 ng/mL (ref 0.10–4.00)

## 2013-12-30 LAB — CBC WITH DIFFERENTIAL/PLATELET
BASOS ABS: 0 10*3/uL (ref 0.0–0.1)
Basophils Relative: 0.2 % (ref 0.0–3.0)
EOS ABS: 0.2 10*3/uL (ref 0.0–0.7)
Eosinophils Relative: 2.4 % (ref 0.0–5.0)
HEMATOCRIT: 46.5 % (ref 39.0–52.0)
Hemoglobin: 15.9 g/dL (ref 13.0–17.0)
LYMPHS ABS: 2.3 10*3/uL (ref 0.7–4.0)
LYMPHS PCT: 30.2 % (ref 12.0–46.0)
MCHC: 34.2 g/dL (ref 30.0–36.0)
MCV: 98 fl (ref 78.0–100.0)
MONOS PCT: 3.4 % (ref 3.0–12.0)
Monocytes Absolute: 0.3 10*3/uL (ref 0.1–1.0)
Neutro Abs: 4.8 10*3/uL (ref 1.4–7.7)
Neutrophils Relative %: 63.8 % (ref 43.0–77.0)
PLATELETS: 131 10*3/uL — AB (ref 150.0–400.0)
RBC: 4.75 Mil/uL (ref 4.22–5.81)
RDW: 14.1 % (ref 11.5–15.5)
WBC: 7.5 10*3/uL (ref 4.0–10.5)

## 2014-01-05 ENCOUNTER — Ambulatory Visit (INDEPENDENT_AMBULATORY_CARE_PROVIDER_SITE_OTHER): Payer: 59 | Admitting: Internal Medicine

## 2014-01-05 ENCOUNTER — Encounter: Payer: Self-pay | Admitting: Internal Medicine

## 2014-01-05 VITALS — BP 130/70 | HR 81 | Temp 98.2°F | Resp 20 | Ht 69.25 in | Wt 190.0 lb

## 2014-01-05 DIAGNOSIS — K219 Gastro-esophageal reflux disease without esophagitis: Secondary | ICD-10-CM

## 2014-01-05 DIAGNOSIS — M1990 Unspecified osteoarthritis, unspecified site: Secondary | ICD-10-CM

## 2014-01-05 DIAGNOSIS — E785 Hyperlipidemia, unspecified: Secondary | ICD-10-CM

## 2014-01-05 DIAGNOSIS — F17200 Nicotine dependence, unspecified, uncomplicated: Secondary | ICD-10-CM

## 2014-01-05 MED ORDER — ATORVASTATIN CALCIUM 40 MG PO TABS: 40.0000 mg | ORAL_TABLET | Freq: Every day | ORAL | Status: DC

## 2014-01-05 NOTE — Patient Instructions (Signed)
It is important that you exercise regularly, at least 20 minutes 3 to 4 times per week.  If you develop chest pain or shortness of breath seek  medical attention.  Smoking tobacco is very bad for your health. You should stop smoking immediately.Health Maintenance A healthy lifestyle and preventative care can promote health and wellness.  Maintain regular health, dental, and eye exams.  Eat a healthy diet. Foods like vegetables, fruits, whole grains, low-fat dairy products, and lean protein foods contain the nutrients you need and are low in calories. Decrease your intake of foods high in solid fats, added sugars, and salt. Get information about a proper diet from your health care provider, if necessary.  Regular physical exercise is one of the most important things you can do for your health. Most adults should get at least 150 minutes of moderate-intensity exercise (any activity that increases your heart rate and causes you to sweat) each week. In addition, most adults need muscle-strengthening exercises on 2 or more days a week.   Maintain a healthy weight. The body mass index (BMI) is a screening tool to identify possible weight problems. It provides an estimate of body fat based on height and weight. Your health care provider can find your BMI and can help you achieve or maintain a healthy weight. For males 20 years and older:  A BMI below 18.5 is considered underweight.  A BMI of 18.5 to 24.9 is normal.  A BMI of 25 to 29.9 is considered overweight.  A BMI of 30 and above is considered obese.  Maintain normal blood lipids and cholesterol by exercising and minimizing your intake of saturated fat. Eat a balanced diet with plenty of fruits and vegetables. Blood tests for lipids and cholesterol should begin at age 51 and be repeated every 5 years. If your lipid or cholesterol levels are high, you are over age 62, or you are at high risk for heart disease, you may need your cholesterol levels  checked more frequently.Ongoing high lipid and cholesterol levels should be treated with medicines if diet and exercise are not working.  If you smoke, find out from your health care provider how to quit. If you do not use tobacco, do not start.  Lung cancer screening is recommended for adults aged 50-80 years who are at high risk for developing lung cancer because of a history of smoking. A yearly low-dose CT scan of the lungs is recommended for people who have at least a 30-pack-year history of smoking and are current smokers or have quit within the past 15 years. A pack year of smoking is smoking an average of 1 pack of cigarettes a day for 1 year (for example, a 30-pack-year history of smoking could mean smoking 1 pack a day for 30 years or 2 packs a day for 15 years). Yearly screening should continue until the smoker has stopped smoking for at least 15 years. Yearly screening should be stopped for people who develop a health problem that would prevent them from having lung cancer treatment.  If you choose to drink alcohol, do not have more than 2 drinks per day. One drink is considered to be 12 oz (360 mL) of beer, 5 oz (150 mL) of wine, or 1.5 oz (45 mL) of liquor.  Avoid the use of street drugs. Do not share needles with anyone. Ask for help if you need support or instructions about stopping the use of drugs.  High blood pressure causes heart disease and increases  the risk of stroke. Blood pressure should be checked at least every 1-2 years. Ongoing high blood pressure should be treated with medicines if weight loss and exercise are not effective.  If you are 66-72 years old, ask your health care provider if you should take aspirin to prevent heart disease.  Diabetes screening involves taking a blood sample to check your fasting blood sugar level. This should be done once every 3 years after age 69 if you are at a normal weight and without risk factors for diabetes. Testing should be considered  at a younger age or be carried out more frequently if you are overweight and have at least 1 risk factor for diabetes.  Colorectal cancer can be detected and often prevented. Most routine colorectal cancer screening begins at the age of 23 and continues through age 57. However, your health care provider may recommend screening at an earlier age if you have risk factors for colon cancer. On a yearly basis, your health care provider may provide home test kits to check for hidden blood in the stool. A small camera at the end of a tube may be used to directly examine the colon (sigmoidoscopy or colonoscopy) to detect the earliest forms of colorectal cancer. Talk to your health care provider about this at age 34 when routine screening begins. A direct exam of the colon should be repeated every 5-10 years through age 79, unless early forms of precancerous polyps or small growths are found.  People who are at an increased risk for hepatitis B should be screened for this virus. You are considered at high risk for hepatitis B if:  You were born in a country where hepatitis B occurs often. Talk with your health care provider about which countries are considered high risk.  Your parents were born in a high-risk country and you have not received a shot to protect against hepatitis B (hepatitis B vaccine).  You have HIV or AIDS.  You use needles to inject street drugs.  You live with, or have sex with, someone who has hepatitis B.  You are a man who has sex with other men (MSM).  You get hemodialysis treatment.  You take certain medicines for conditions like cancer, organ transplantation, and autoimmune conditions.  Hepatitis C blood testing is recommended for all people born from [birth year] through 1966 and any individual with known risk factors for hepatitis C.  Healthy men should no longer receive prostate-specific antigen (PSA) blood tests as part of routine cancer screening. Talk to your health care  provider about prostate cancer screening.  Testicular cancer screening is not recommended for adolescents or adult males who have no symptoms. Screening includes self-exam, a health care provider exam, and other screening tests. Consult with your health care provider about any symptoms you have or any concerns you have about testicular cancer.  Practice safe sex. Use condoms and avoid high-risk sexual practices to reduce the spread of sexually transmitted infections (STIs).  You should be screened for STIs, including gonorrhea and chlamydia if:  You are sexually active and are younger than 24 years.  You are older than 24 years, and your health care provider tells you that you are at risk for this type of infection.  Your sexual activity has changed since you were last screened, and you are at an increased risk for chlamydia or gonorrhea. Ask your health care provider if you are at risk.  If you are at risk of being infected with HIV, it  is recommended that you take a prescription medicine daily to prevent HIV infection. This is called pre-exposure prophylaxis (PrEP). You are considered at risk if:  You are a man who has sex with other men (MSM).  You are a heterosexual man who is sexually active with multiple partners.  You take drugs by injection.  You are sexually active with a partner who has HIV.  Talk with your health care provider about whether you are at high risk of being infected with HIV. If you choose to begin PrEP, you should first be tested for HIV. You should then be tested every 3 months for as long as you are taking PrEP.  Use sunscreen. Apply sunscreen liberally and repeatedly throughout the day. You should seek shade when your shadow is shorter than you. Protect yourself by wearing long sleeves, pants, a wide-brimmed hat, and sunglasses year round whenever you are outdoors.  Tell your health care provider of new moles or changes in moles, especially if there is a change  in shape or color. Also, tell your health care provider if a mole is larger than the size of a pencil eraser.  A one-time screening for abdominal aortic aneurysm (AAA) and surgical repair of large AAAs by ultrasound is recommended for men aged 74-75 years who are current or former smokers.  Stay current with your vaccines (immunizations). Document Released: 09/29/2007 Document Revised: 04/07/2013 Document Reviewed: 08/28/2010 [facility] Patient Information 2015 [place], [place]. This information is not intended to replace advice given to you by your health care provider. Make sure you discuss any questions you have with your health care provider. Cardiac Diet This diet can help prevent heart disease and stroke. Many factors influence your heart health, including eating and exercise habits. Coronary risk rises a lot with abnormal blood fat (lipid) levels. Cardiac meal planning includes limiting unhealthy fats, increasing healthy fats, and making other small dietary changes. General guidelines are as follows:  Adjust calorie intake to reach and maintain desirable body weight.  Limit total fat intake to less than 30% of total calories. Saturated fat should be less than 7% of calories.  Saturated fats are found in animal products and in some vegetable products. Saturated vegetable fats are found in coconut oil, cocoa butter, palm oil, and palm kernel oil. Read labels carefully to avoid these products as much as possible. Use butter in moderation. Choose tub margarines and oils that have 2 grams of fat or less. Good cooking oils are canola and olive oils.  Practice low-fat cooking techniques. Do not fry food. Instead, broil, bake, boil, steam, grill, roast on a rack, stir-fry, or microwave it. Other fat reducing suggestions include:  Remove the skin from poultry.  Remove all visible fat from meats.  Skim the fat off stews, soups, and gravies before serving them.  Steam vegetables in water or broth  instead of sauting them in fat.  Avoid foods with trans fat (or hydrogenated oils), such as commercially fried foods and commercially baked goods. Commercial shortening and deep-frying fats will contain trans fat.  Increase intake of fruits, vegetables, whole grains, and legumes to replace foods high in fat.  Increase consumption of nuts, legumes, and seeds to at least 4 servings weekly. One serving of a legume equals  cup, and 1 serving of nuts or seeds equals  cup.  Choose whole grains more often. Have 3 servings per day (a serving is 1 ounce [oz]).  Eat 4 to 5 servings of vegetables per day. A serving  of vegetables is 1 cup of raw leafy vegetables;  cup of raw or cooked cut-up vegetables;  cup of vegetable juice.  Eat 4 to 5 servings of fruit per day. A serving of fruit is 1 medium whole fruit;  cup of dried fruit;  cup of fresh, frozen, or canned fruit;  cup of 100% fruit juice.  Increase your intake of dietary fiber to 20 to 30 grams per day. Insoluble fiber may help lower your risk of heart disease and may help curb your appetite.  Soluble fiber binds cholesterol to be removed from the blood. Foods high in soluble fiber are dried beans, citrus fruits, oats, apples, bananas, broccoli, Brussels sprouts, and eggplant.  Try to include foods fortified with plant sterols or stanols, such as yogurt, breads, juices, or margarines. Choose several fortified foods to achieve a daily intake of 2 to 3 grams of plant sterols or stanols.  Foods with omega-3 fats can help reduce your risk of heart disease. Aim to have a 3.5 oz portion of fatty fish twice per week, such as salmon, mackerel, albacore tuna, sardines, lake trout, or herring. If you wish to take a fish oil supplement, choose one that contains 1 gram of both DHA and EPA.  Limit processed meats to 2 servings (3 oz portion) weekly.  Limit the sodium in your diet to 1500 milligrams (mg) per day. If you have high blood pressure, talk to a  registered dietitian about a DASH (Dietary Approaches to Stop Hypertension) eating plan.  Limit sweets and beverages with added sugar, such as soda, to no more than 5 servings per week. One serving is:   1 tablespoon sugar.  1 tablespoon jelly or jam.   cup sorbet.  1 cup lemonade.   cup regular soda. CHOOSING FOODS Starches  Allowed: Breads: All kinds (wheat, rye, raisin, white, oatmeal, [place], [place], and English muffin bread). Low-fat rolls: English muffins, frankfurter and hamburger buns, bagels, pita bread, tortillas (not fried). Pancakes, waffles, biscuits, and muffins made with recommended oil.  Avoid: Products made with saturated or trans fats, oils, or whole milk products. Butter rolls, cheese breads, croissants. Commercial doughnuts, muffins, sweet rolls, biscuits, waffles, pancakes, store-bought mixes. Crackers  Allowed: Low-fat crackers and snacks: Animal, graham, rye, saltine (with recommended oil, no lard), oyster, and matzo crackers. Bread sticks, melba toast, rusks, flatbread, pretzels, and light popcorn.  Avoid: High-fat crackers: cheese crackers, butter crackers, and those made with coconut, palm oil, or trans fat (hydrogenated oils). Buttered popcorn. Cereals  Allowed: Hot or cold whole-grain cereals.  Avoid: Cereals containing coconut, hydrogenated vegetable fat, or animal fat. Potatoes / Pasta / Rice  Allowed: All kinds of potatoes, rice, and pasta (such as macaroni, spaghetti, and noodles).  Avoid: Pasta or rice prepared with cream sauce or high-fat cheese. Chow mein noodles, [place] fries. Vegetables  Allowed: All vegetables and vegetable juices.  Avoid: Fried vegetables. Vegetables in cream, butter, or high-fat cheese sauces. Limit coconut. Fruit in cream or custard. Protein  Allowed: Limit your intake of meat, seafood, and poultry to no more than 6 oz (cooked weight) per day. All lean, well-trimmed beef, veal, pork, and lamb. All chicken and  [place] without skin. All fish and shellfish. Wild game: wild duck, rabbit, pheasant, and venison. Egg whites or low-cholesterol egg substitutes may be used as desired. Meatless dishes: recipes with dried beans, peas, lentils, and tofu (soybean curd). Seeds and nuts: all seeds and most nuts.  Avoid: Prime grade and other heavily marbled and fatty  meats, such as short ribs, spare ribs, rib eye roast or steak, frankfurters, sausage, bacon, and high-fat luncheon meats, mutton. Caviar. Commercially fried fish. Domestic duck, goose, venison sausage. Organ meats: liver, gizzard, heart, chitterlings, brains, kidney, sweetbreads. Dairy  Allowed: Low-fat cheeses: nonfat or low-fat cottage cheese (1% or 2% fat), cheeses made with part skim milk, such as mozzarella, farmers, string, or ricotta. (Cheeses should be labeled no more than 2 to 6 grams fat per oz.). Skim (or 1%) milk: liquid, powdered, or evaporated. Buttermilk made with low-fat milk. Drinks made with skim or low-fat milk or cocoa. Chocolate milk or cocoa made with skim or low-fat (1%) milk. Nonfat or low-fat yogurt.  Avoid: Whole milk cheeses, including colby, cheddar, [place], Monterey Jack, [place], [place], [place], American, Swiss, and blue. Creamed cottage cheese, cream cheese. Whole milk and whole milk products, including buttermilk or yogurt made from whole milk, drinks made from whole milk. Condensed milk, evaporated whole milk, and 2% milk. Soups and Combination Foods  Allowed: Low-fat low-sodium soups: broth, dehydrated soups, homemade broth, soups with the fat removed, homemade cream soups made with skim or low-fat milk. Low-fat spaghetti, lasagna, chili, and Spanish rice if low-fat ingredients and low-fat cooking techniques are used.  Avoid: Cream soups made with whole milk, cream, or high-fat cheese. All other soups. Desserts and Sweets  Allowed: Sherbet, fruit ices, gelatins, meringues, and angel food cake. Homemade desserts with  recommended fats, oils, and milk products. Jam, jelly, honey, marmalade, sugars, and syrups. Pure sugar candy, such as gum drops, hard candy, jelly beans, marshmallows, mints, and small amounts of dark chocolate.  Avoid: Commercially prepared cakes, pies, cookies, frosting, pudding, or mixes for these products. Desserts containing whole milk products, chocolate, coconut, lard, palm oil, or palm kernel oil. Ice cream or ice cream drinks. Candy that contains chocolate, coconut, butter, hydrogenated fat, or unknown ingredients. Buttered syrups. Fats and Oils  Allowed: Vegetable oils: safflower, sunflower, corn, soybean, cottonseed, sesame, canola, olive, or peanut. Non-hydrogenated margarines. Salad dressing or mayonnaise: homemade or commercial, made with a recommended oil. Low or nonfat salad dressing or mayonnaise.  Limit added fats and oils to 6 to 8 tsp per day (includes fats used in cooking, baking, salads, and spreads on bread). Remember to count the "hidden fats" in foods.  Avoid: Solid fats and shortenings: butter, lard, salt pork, bacon drippings. Gravy containing meat fat, shortening, or suet. Cocoa butter, coconut. Coconut oil, palm oil, palm kernel oil, or hydrogenated oils: these ingredients are often used in bakery products, nondairy creamers, whipped toppings, candy, and commercially fried foods. Read labels carefully. Salad dressings made of unknown oils, sour cream, or cheese, such as blue cheese and Roquefort. Cream, all kinds: half-and-half, light, heavy, or whipping. Sour cream or cream cheese (even if "light" or low-fat). Nondairy cream substitutes: coffee creamers and sour cream substitutes made with palm, palm kernel, hydrogenated oils, or coconut oil. Beverages  Allowed: Coffee (regular or decaffeinated), tea. Diet carbonated beverages, mineral water. Alcohol: Check with your caregiver. Moderation is recommended.  Avoid: Whole milk, regular sodas, and juice drinks with added  sugar. Condiments  Allowed: All seasonings and condiments. Cocoa powder. "Cream" sauces made with recommended ingredients.  Avoid: Carob powder made with hydrogenated fats. SAMPLE MENU Breakfast   cup orange juice   cup oatmeal  1 slice toast  1 tsp margarine  1 cup skim milk Lunch  [place] sandwich with 2 oz [place], 2 slices bread  Lettuce and tomato slices  Fresh fruit  Carrot  sticks  Coffee or tea Snack  Fresh fruit or low-fat crackers Dinner  3 oz lean ground beef  1 baked potato  1 tsp margarine   cup asparagus  Lettuce salad  1 tbs non-creamy dressing   cup peach slices  1 cup skim milk Document Released: 01/10/2008 Document Revised: 10/02/2011 Document Reviewed: 06/02/2013 [employer] Patient Information 2015 [place], [place]. This information is not intended to replace advice given to you by your health care provider. Make sure you discuss any questions you have with your health care provider.

## 2014-01-05 NOTE — Progress Notes (Signed)
Pre visit review using our clinic review tool, if applicable. No additional management support is needed unless otherwise documented below in the visit note. 

## 2014-01-05 NOTE — Progress Notes (Signed)
Patient ID: Jesse [name], male   DOB: [birth year], 63 y.o.   MRN: [id number]  Subjective:    Patient ID: Jesse [name], male    DOB: 1950/07/15, 63 y.o.   MRN: [id number]  HPI 30 -year-old patient who is seen today for an annual health examination.  He is doing quite well. He has been exercising regularly and moderate in his diet with some excellent weight loss. In general he feels quite well Laboratory studies were reviewed  Wt Readings from Last 3 Encounters:  01/05/14 190 lb (86.183 kg)  10/07/12 195 lb (88.451 kg)  07/05/11 189 lb (85.73 kg)     Past Medical History:   GERD at EGD 2006  gastritis (neg H. pylori)  Hyperlipidemia  Bladder CA  Diverticulosis, colon  Osteoarthritis  vitamin D deficiency  impaired glucose tolerance  ED   Past Surgical History:   Knee surgery- bilateral  colonoscopy 2005  cystoscopy March 2003   Family History:   father died age 39, MI, history of type 2 diabetes  mother died 51 complications of senile dementia  one brother remains in good health  No FH of Colon Cancer:  Family History of Heart Disease: maternal grandfather   Social History:   Married  Current Smoker 1/2 per day  Alcohol Use - yes wine with dinner  Daily Caffeine Use 2 cups per day  Illicit Drug Use - no    Review of Systems  Constitutional: Negative for fever, chills, activity change, appetite change and fatigue.  HENT: Negative for congestion, dental problem, ear pain, hearing loss, mouth sores, rhinorrhea, sinus pressure, sneezing, tinnitus, trouble swallowing and voice change.   Eyes: Negative for photophobia, pain, redness and visual disturbance.  Respiratory: Negative for apnea, cough, choking, chest tightness, shortness of breath and wheezing.   Cardiovascular: Negative for chest pain, palpitations and leg swelling.  Gastrointestinal: Negative for nausea, vomiting, abdominal pain, diarrhea, constipation, blood in stool, abdominal distention, anal  bleeding and rectal pain.  Genitourinary: Negative for dysuria, urgency, frequency, hematuria, flank pain, decreased urine volume, discharge, penile swelling, scrotal swelling, difficulty urinating, genital sores and testicular pain.  Musculoskeletal: Negative for arthralgias, back pain, gait problem, joint swelling, myalgias, neck pain and neck stiffness.  Skin: Negative for color change, rash and wound.  Neurological: Negative for dizziness, tremors, seizures, syncope, facial asymmetry, speech difficulty, weakness, light-headedness, numbness and headaches.  Hematological: Negative for adenopathy. Does not bruise/bleed easily.  Psychiatric/Behavioral: Negative for suicidal ideas, hallucinations, behavioral problems, confusion, sleep disturbance, self-injury, dysphoric mood, decreased concentration and agitation. The patient is not nervous/anxious.        Objective:   Physical Exam  Constitutional: He appears well-developed and well-nourished.  HENT:  Head: Normocephalic and atraumatic.  Right Ear: External ear normal.  Left Ear: External ear normal.  Nose: Nose normal.  Mouth/Throat: Oropharynx is clear and moist.  Eyes: Conjunctivae and EOM are normal. Pupils are equal, round, and reactive to light. No scleral icterus.  Neck: Normal range of motion. Neck supple. No JVD present. No thyromegaly present.  Cardiovascular: Regular rhythm, normal heart sounds and intact distal pulses.  Exam reveals no gallop and no friction rub.   No murmur heard. Pulmonary/Chest: Effort normal and breath sounds normal. He exhibits no tenderness.  Abdominal: Soft. Bowel sounds are normal. He exhibits no distension and no mass. There is no tenderness.  Genitourinary: Prostate normal and penis normal. Guaiac negative stool.  Musculoskeletal: Normal range of motion. He exhibits no edema and no tenderness.  Status post bilateral total knee replacement  Lymphadenopathy:    He has no cervical adenopathy.   Neurological: He is alert. He has normal reflexes. No cranial nerve deficit. Coordination normal.  Skin: Skin is warm and dry. No rash noted.  Psychiatric: He has a normal mood and affect. His behavior is normal.          Assessment & Plan:   Preventive health examination Tobacco use. Total smoking cessation encouraged Dyslipidemia stable with improved HDL. Continue lifestyle management. The patient is exercising regularly and there has been some modest weight loss

## 2014-06-15 ENCOUNTER — Encounter: Payer: Self-pay | Admitting: Internal Medicine

## 2014-08-16 ENCOUNTER — Other Ambulatory Visit: Payer: Self-pay | Admitting: Internal Medicine

## 2014-08-16 MED ORDER — OMEPRAZOLE 40 MG PO CPDR: 40.0000 mg | DELAYED_RELEASE_CAPSULE | Freq: Every day | ORAL | Status: DC

## 2014-11-30 ENCOUNTER — Other Ambulatory Visit: Payer: Self-pay | Admitting: *Deleted

## 2014-11-30 MED ORDER — OMEPRAZOLE 40 MG PO CPDR: 40.0000 mg | DELAYED_RELEASE_CAPSULE | Freq: Every day | ORAL | Status: DC

## 2015-05-26 ENCOUNTER — Other Ambulatory Visit (INDEPENDENT_AMBULATORY_CARE_PROVIDER_SITE_OTHER): Payer: 59

## 2015-05-26 DIAGNOSIS — Z0000 Encounter for general adult medical examination without abnormal findings: Secondary | ICD-10-CM

## 2015-05-26 DIAGNOSIS — R7989 Other specified abnormal findings of blood chemistry: Secondary | ICD-10-CM

## 2015-05-26 LAB — CBC WITH DIFFERENTIAL/PLATELET
Basophils Absolute: 0 10*3/uL (ref 0.0–0.1)
Basophils Relative: 0.3 % (ref 0.0–3.0)
EOS ABS: 0.1 10*3/uL (ref 0.0–0.7)
Eosinophils Relative: 1.8 % (ref 0.0–5.0)
HCT: 44 % (ref 39.0–52.0)
Hemoglobin: 14.8 g/dL (ref 13.0–17.0)
LYMPHS ABS: 2.1 10*3/uL (ref 0.7–4.0)
Lymphocytes Relative: 26 % (ref 12.0–46.0)
MCHC: 33.7 g/dL (ref 30.0–36.0)
MCV: 95.8 fl (ref 78.0–100.0)
MONOS PCT: 4.8 % (ref 3.0–12.0)
Monocytes Absolute: 0.4 10*3/uL (ref 0.1–1.0)
NEUTROS ABS: 5.5 10*3/uL (ref 1.4–7.7)
NEUTROS PCT: 67.1 % (ref 43.0–77.0)
PLATELETS: 183 10*3/uL (ref 150.0–400.0)
RBC: 4.6 Mil/uL (ref 4.22–5.81)
RDW: 13.6 % (ref 11.5–15.5)
WBC: 8.2 10*3/uL (ref 4.0–10.5)

## 2015-05-26 LAB — HEPATIC FUNCTION PANEL
ALBUMIN: 3.9 g/dL (ref 3.5–5.2)
ALT: 22 U/L (ref 0–53)
AST: 20 U/L (ref 0–37)
Alkaline Phosphatase: 87 U/L (ref 39–117)
Bilirubin, Direct: 0.1 mg/dL (ref 0.0–0.3)
Total Bilirubin: 0.5 mg/dL (ref 0.2–1.2)
Total Protein: 6.6 g/dL (ref 6.0–8.3)

## 2015-05-26 LAB — POC URINALSYSI DIPSTICK (AUTOMATED)
BILIRUBIN UA: NEGATIVE
GLUCOSE UA: NEGATIVE
Ketones, UA: NEGATIVE
Leukocytes, UA: NEGATIVE
NITRITE UA: NEGATIVE
RBC UA: NEGATIVE
Spec Grav, UA: 1.02
Urobilinogen, UA: 0.2
pH, UA: 6.5

## 2015-05-26 LAB — LIPID PANEL
CHOLESTEROL: 175 mg/dL (ref 0–200)
HDL: 32.5 mg/dL — AB (ref >39.00)
NonHDL: 142.73
Total CHOL/HDL Ratio: 5
Triglycerides: 262 mg/dL — ABNORMAL HIGH (ref 0.0–149.0)
VLDL: 52.4 mg/dL — AB (ref 0.0–40.0)

## 2015-05-26 LAB — PSA: PSA: 1.55 ng/mL (ref 0.10–4.00)

## 2015-05-26 LAB — BASIC METABOLIC PANEL
BUN: 19 mg/dL (ref 6–23)
CALCIUM: 9.5 mg/dL (ref 8.4–10.5)
CO2: 31 mEq/L (ref 19–32)
CREATININE: 0.88 mg/dL (ref 0.40–1.50)
Chloride: 105 mEq/L (ref 96–112)
GFR: 92.14 mL/min (ref >60.00)
GLUCOSE: 123 mg/dL — AB (ref 70–99)
Potassium: 5 mEq/L (ref 3.5–5.1)
SODIUM: 142 meq/L (ref 135–145)

## 2015-05-26 LAB — TSH: TSH: 1.42 u[IU]/mL (ref 0.35–4.50)

## 2015-05-26 LAB — LDL CHOLESTEROL, DIRECT: Direct LDL: 85 mg/dL

## 2015-06-03 ENCOUNTER — Encounter: Payer: Self-pay | Admitting: Internal Medicine

## 2015-06-03 ENCOUNTER — Ambulatory Visit (INDEPENDENT_AMBULATORY_CARE_PROVIDER_SITE_OTHER): Payer: 59 | Admitting: Internal Medicine

## 2015-06-03 VITALS — BP 146/90 | HR 89 | Temp 98.6°F | Resp 20 | Ht 68.75 in | Wt 188.0 lb

## 2015-06-03 DIAGNOSIS — F17200 Nicotine dependence, unspecified, uncomplicated: Secondary | ICD-10-CM

## 2015-06-03 DIAGNOSIS — Z0000 Encounter for general adult medical examination without abnormal findings: Secondary | ICD-10-CM | POA: Diagnosis not present

## 2015-06-03 DIAGNOSIS — E785 Hyperlipidemia, unspecified: Secondary | ICD-10-CM

## 2015-06-03 DIAGNOSIS — M150 Primary generalized (osteo)arthritis: Secondary | ICD-10-CM

## 2015-06-03 DIAGNOSIS — M159 Polyosteoarthritis, unspecified: Secondary | ICD-10-CM

## 2015-06-03 MED ORDER — ATORVASTATIN CALCIUM 40 MG PO TABS: 40.0000 mg | ORAL_TABLET | Freq: Every day | ORAL | Status: DC

## 2015-06-03 MED ORDER — TADALAFIL 20 MG PO TABS: 20.0000 mg | ORAL_TABLET | Freq: Every day | ORAL | Status: DC | PRN

## 2015-06-03 MED ORDER — OMEPRAZOLE 40 MG PO CPDR: 40.0000 mg | DELAYED_RELEASE_CAPSULE | Freq: Every day | ORAL | Status: DC

## 2015-06-03 NOTE — Progress Notes (Signed)
Patient ID: Jesse [name], male   DOB: Aug 21, 1950, 65 y.o.   MRN: [id number]  Subjective:    Patient ID: Jesse [name], male    DOB: 11-07-1950, 65 y.o.   MRN: [id number]  HPI 65 year old patient who is seen today for an annual health examination.  He is doing quite well. He has been exercising regularly and moderate in his diet with some excellent weight loss. In general he feels quite well Laboratory studies were reviewed  Wt Readings from Last 3 Encounters:  06/03/15 188 lb (85.276 kg)  01/05/14 190 lb (86.183 kg)  10/07/12 195 lb (88.451 kg)     Past Medical History:   GERD at EGD 2006  gastritis (neg H. pylori)  Hyperlipidemia  Bladder CA  Diverticulosis, colon  Osteoarthritis  vitamin D deficiency  impaired glucose tolerance  ED   Past Surgical History:   Knee surgery- bilateral  colonoscopy 2005  cystoscopy March 2003   Family History:   father died age 91, MI, history of type 2 diabetes  mother died 70 complications of senile dementia  one brother remains in good health  No FH of Colon Cancer:  Family History of Heart Disease: maternal grandfather   Social History:   Married  Current Smoker 1/2 per day  Alcohol Use - yes wine with dinner  Daily Caffeine Use 2 cups per day  Illicit Drug Use - no    Review of Systems  Constitutional: Negative for fever, chills, activity change, appetite change and fatigue.  HENT: Negative for congestion, dental problem, ear pain, hearing loss, mouth sores, rhinorrhea, sinus pressure, sneezing, tinnitus, trouble swallowing and voice change.   Eyes: Negative for photophobia, pain, redness and visual disturbance.  Respiratory: Negative for apnea, cough, choking, chest tightness, shortness of breath and wheezing.   Cardiovascular: Negative for chest pain, palpitations and leg swelling.  Gastrointestinal: Negative for nausea, vomiting, abdominal pain, diarrhea, constipation, blood in stool, abdominal distention, anal  bleeding and rectal pain.  Genitourinary: Negative for dysuria, urgency, frequency, hematuria, flank pain, decreased urine volume, discharge, penile swelling, scrotal swelling, difficulty urinating, genital sores and testicular pain.  Musculoskeletal: Negative for myalgias, back pain, joint swelling, arthralgias, gait problem, neck pain and neck stiffness.  Skin: Negative for color change, rash and wound.  Neurological: Negative for dizziness, tremors, seizures, syncope, facial asymmetry, speech difficulty, weakness, light-headedness, numbness and headaches.  Hematological: Negative for adenopathy. Does not bruise/bleed easily.  Psychiatric/Behavioral: Negative for suicidal ideas, hallucinations, behavioral problems, confusion, sleep disturbance, self-injury, dysphoric mood, decreased concentration and agitation. The patient is not nervous/anxious.        Objective:   Physical Exam  Constitutional: He appears well-developed and well-nourished.  HENT:  Head: Normocephalic and atraumatic.  Right Ear: External ear normal.  Left Ear: External ear normal.  Nose: Nose normal.  Mouth/Throat: Oropharynx is clear and moist.  Eyes: Conjunctivae and EOM are normal. Pupils are equal, round, and reactive to light. No scleral icterus.  Neck: Normal range of motion. Neck supple. No JVD present. No thyromegaly present.  Cardiovascular: Regular rhythm, normal heart sounds and intact distal pulses.  Exam reveals no gallop and no friction rub.   No murmur heard. Pulmonary/Chest: Effort normal and breath sounds normal. He exhibits no tenderness.  Abdominal: Soft. Bowel sounds are normal. He exhibits no distension and no mass. There is no tenderness.  Genitourinary: Prostate normal and penis normal. Guaiac negative stool.  Musculoskeletal: Normal range of motion. He exhibits no edema or tenderness.  Status  post bilateral total knee replacement  Lymphadenopathy:    He has no cervical adenopathy.   Neurological: He is alert. He has normal reflexes. No cranial nerve deficit. Coordination normal.  Skin: Skin is warm and dry. No rash noted.  Psychiatric: He has a normal mood and affect. His behavior is normal.          Assessment & Plan:   Preventive health examination.  Needs follow-up colonoscopy Tobacco use. Total smoking cessation encouraged.  Schedule low contrast chest CT Dyslipidemia stable with improved HDL. Continue lifestyle management. The patient is exercising regularly and there has been some modest weight loss

## 2015-06-03 NOTE — Progress Notes (Signed)
Pre visit review using our clinic review tool, if applicable. No additional management support is needed unless otherwise documented below in the visit note. 

## 2015-06-03 NOTE — Patient Instructions (Addendum)
Limit your sodium (Salt) intake    It is important that you exercise regularly, at least 20 minutes 3 to 4 times per week.  If you develop chest pain or shortness of breath seek  medical attention.  Return in one year for follow-up  Health Maintenance, Male A healthy lifestyle and preventative care can promote health and wellness.  Maintain regular health, dental, and eye exams.  Eat a healthy diet. Foods like vegetables, fruits, whole grains, low-fat dairy products, and lean protein foods contain the nutrients you need and are low in calories. Decrease your intake of foods high in solid fats, added sugars, and salt. Get information about a proper diet from your health care provider, if necessary.  Regular physical exercise is one of the most important things you can do for your health. Most adults should get at least 150 minutes of moderate-intensity exercise (any activity that increases your heart rate and causes you to sweat) each week. In addition, most adults need muscle-strengthening exercises on 2 or more days a week.   Maintain a healthy weight. The body mass index (BMI) is a screening tool to identify possible weight problems. It provides an estimate of body fat based on height and weight. Your health care provider can find your BMI and can help you achieve or maintain a healthy weight. For males 20 years and older:  A BMI below 18.5 is considered underweight.  A BMI of 18.5 to 24.9 is normal.  A BMI of 25 to 29.9 is considered overweight.  A BMI of 30 and above is considered obese.  Maintain normal blood lipids and cholesterol by exercising and minimizing your intake of saturated fat. Eat a balanced diet with plenty of fruits and vegetables. Blood tests for lipids and cholesterol should begin at age 19 and be repeated every 5 years. If your lipid or cholesterol levels are high, you are over age 35, or you are at high risk for heart disease, you may need your cholesterol levels  checked more frequently.Ongoing high lipid and cholesterol levels should be treated with medicines if diet and exercise are not working.  If you smoke, find out from your health care provider how to quit. If you do not use tobacco, do not start.  Lung cancer screening is recommended for adults aged 84-80 years who are at high risk for developing lung cancer because of a history of smoking. A yearly low-dose CT scan of the lungs is recommended for people who have at least a 30-pack-year history of smoking and are current smokers or have quit within the past 15 years. A pack year of smoking is smoking an average of 1 pack of cigarettes a day for 1 year (for example, a 30-pack-year history of smoking could mean smoking 1 pack a day for 30 years or 2 packs a day for 15 years). Yearly screening should continue until the smoker has stopped smoking for at least 15 years. Yearly screening should be stopped for people who develop a health problem that would prevent them from having lung cancer treatment.  If you choose to drink alcohol, do not have more than 2 drinks per day. One drink is considered to be 12 oz (360 mL) of beer, 5 oz (150 mL) of wine, or 1.5 oz (45 mL) of liquor.  Avoid the use of street drugs. Do not share needles with anyone. Ask for help if you need support or instructions about stopping the use of drugs.  High blood pressure causes  heart disease and increases the risk of stroke. High blood pressure is more likely to develop in:  People who have blood pressure in the end of the normal range (100-139/85-89 mm Hg).  People who are overweight or obese.  People who are African American.  If you are 49-57 years of age, have your blood pressure checked every 3-5 years. If you are 21 years of age or older, have your blood pressure checked every year. You should have your blood pressure measured twice--once when you are at a hospital or clinic, and once when you are not at a hospital or clinic.  Record the average of the two measurements. To check your blood pressure when you are not at a hospital or clinic, you can use:  An automated blood pressure machine at a pharmacy.  A home blood pressure monitor.  If you are 33-31 years old, ask your health care provider if you should take aspirin to prevent heart disease.  Diabetes screening involves taking a blood sample to check your fasting blood sugar level. This should be done once every 3 years after age 39 if you are at a normal weight and without risk factors for diabetes. Testing should be considered at a younger age or be carried out more frequently if you are overweight and have at least 1 risk factor for diabetes.  Colorectal cancer can be detected and often prevented. Most routine colorectal cancer screening begins at the age of 56 and continues through age 31. However, your health care provider may recommend screening at an earlier age if you have risk factors for colon cancer. On a yearly basis, your health care provider may provide home test kits to check for hidden blood in the stool. A small camera at the end of a tube may be used to directly examine the colon (sigmoidoscopy or colonoscopy) to detect the earliest forms of colorectal cancer. Talk to your health care provider about this at age 57 when routine screening begins. A direct exam of the colon should be repeated every 5-10 years through age 49, unless early forms of precancerous polyps or small growths are found.  People who are at an increased risk for hepatitis B should be screened for this virus. You are considered at high risk for hepatitis B if:  You were born in a country where hepatitis B occurs often. Talk with your health care provider about which countries are considered high risk.  Your parents were born in a high-risk country and you have not received a shot to protect against hepatitis B (hepatitis B vaccine).  You have HIV or AIDS.  You use needles to  inject street drugs.  You live with, or have sex with, someone who has hepatitis B.  You are a man who has sex with other men (MSM).  You get hemodialysis treatment.  You take certain medicines for conditions like cancer, organ transplantation, and autoimmune conditions.  Hepatitis C blood testing is recommended for all people born from [birth year] through 1966 and any individual with known risk factors for hepatitis C.  Healthy men should no longer receive prostate-specific antigen (PSA) blood tests as part of routine cancer screening. Talk to your health care provider about prostate cancer screening.  Testicular cancer screening is not recommended for adolescents or adult males who have no symptoms. Screening includes self-exam, a health care provider exam, and other screening tests. Consult with your health care provider about any symptoms you have or any concerns you have about  testicular cancer.  Practice safe sex. Use condoms and avoid high-risk sexual practices to reduce the spread of sexually transmitted infections (STIs).  You should be screened for STIs, including gonorrhea and chlamydia if:  You are sexually active and are younger than 24 years.  You are older than 24 years, and your health care provider tells you that you are at risk for this type of infection.  Your sexual activity has changed since you were last screened, and you are at an increased risk for chlamydia or gonorrhea. Ask your health care provider if you are at risk.  If you are at risk of being infected with HIV, it is recommended that you take a prescription medicine daily to prevent HIV infection. This is called pre-exposure prophylaxis (PrEP). You are considered at risk if:  You are a man who has sex with other men (MSM).  You are a heterosexual man who is sexually active with multiple partners.  You take drugs by injection.  You are sexually active with a partner who has HIV.  Talk with your health care  provider about whether you are at high risk of being infected with HIV. If you choose to begin PrEP, you should first be tested for HIV. You should then be tested every 3 months for as long as you are taking PrEP.  Use sunscreen. Apply sunscreen liberally and repeatedly throughout the day. You should seek shade when your shadow is shorter than you. Protect yourself by wearing long sleeves, pants, a wide-brimmed hat, and sunglasses year round whenever you are outdoors.  Tell your health care provider of new moles or changes in moles, especially if there is a change in shape or color. Also, tell your health care provider if a mole is larger than the size of a pencil eraser.  A one-time screening for abdominal aortic aneurysm (AAA) and surgical repair of large AAAs by ultrasound is recommended for men aged 39-75 years who are current or former smokers.  Stay current with your vaccines (immunizations).   This information is not intended to replace advice given to you by your health care provider. Make sure you discuss any questions you have with your health care provider.   Document Released: 09/29/2007 Document Revised: 04/23/2014 Document Reviewed: 08/28/2010 Elsevier Interactive Patient Education [employer].

## 2015-06-08 ENCOUNTER — Ambulatory Visit (INDEPENDENT_AMBULATORY_CARE_PROVIDER_SITE_OTHER): Payer: 59 | Admitting: *Deleted

## 2015-06-08 DIAGNOSIS — Z23 Encounter for immunization: Secondary | ICD-10-CM

## 2015-07-06 ENCOUNTER — Telehealth: Payer: Self-pay | Admitting: Acute Care

## 2015-07-06 DIAGNOSIS — F17210 Nicotine dependence, cigarettes, uncomplicated: Principal | ICD-10-CM

## 2015-07-07 NOTE — Telephone Encounter (Signed)
LMOM TCB x1 for pt to schedule SD[facility] appt for April

## 2015-07-07 NOTE — Telephone Encounter (Signed)
Called spoke with pt. Discussed lung cancer screening program/questionnaire.  Pt does qualify for the program. He is leaving for vacation on Monday and will not return until 07/19/15. Sarah's April schedule is not out yet. Will call to schedule SDMV/LDCT once this schedule is available.

## 2015-07-08 NOTE — Telephone Encounter (Signed)
Pt returned call >> SDMV scheduled for 4.7.17 @ 2pm  LDCT ordered

## 2015-07-22 ENCOUNTER — Encounter: Payer: Self-pay | Admitting: Acute Care

## 2015-07-22 ENCOUNTER — Ambulatory Visit (INDEPENDENT_AMBULATORY_CARE_PROVIDER_SITE_OTHER)
Admission: RE | Admit: 2015-07-22 | Discharge: 2015-07-22 | Disposition: A | Discharge status: Home / Self Care | Payer: 59 | Source: Ambulatory Visit | Attending: Acute Care | Admitting: Acute Care

## 2015-07-22 ENCOUNTER — Ambulatory Visit (INDEPENDENT_AMBULATORY_CARE_PROVIDER_SITE_OTHER): Payer: 59 | Admitting: Acute Care

## 2015-07-22 DIAGNOSIS — F17210 Nicotine dependence, cigarettes, uncomplicated: Secondary | ICD-10-CM

## 2015-07-22 NOTE — Progress Notes (Signed)
Shared Decision Making Visit Lung Cancer Screening Program [phone])   Eligibility:  Age 65 y.o.  Pack Years Smoking History Calculation 71 pack year smoker (# packs/per year x # years smoked)  Recent History of coughing up blood  no  Unexplained weight loss? no ( >Than 15 pounds within the last 6 months )  Prior History Lung / other cancer no (Diagnosis within the last 5 years already requiring surveillance chest CT Scans).  Smoking Status Current Smoker  Former Smokers: Years since quit: NA  Quit Date: NA  Visit Components:  Discussion included one or more decision making aids. yes  Discussion included risk/benefits of screening. yes  Discussion included potential follow up diagnostic testing for abnormal scans. yes  Discussion included meaning and risk of over diagnosis. yes  Discussion included meaning and risk of False Positives. yes  Discussion included meaning of total radiation exposure. yes  Counseling Included:  Importance of adherence to annual lung cancer LDCT screening. yes  Impact of comorbidities on ability to participate in the program. yes  Ability and willingness to under diagnostic treatment. yes  Smoking Cessation Counseling:  Current Smokers:   Discussed importance of smoking cessation. yes  Information about tobacco cessation classes and interventions provided to patient. yes  Patient provided with "ticket" for LDCT Scan. yes  Symptomatic Patient. no  Counseling:NA  Diagnosis Code: Tobacco Use Z72.0  Asymptomatic Patient yes  Counseling (Intermediate counseling: > three minutes counseling) [id number]  Former Smokers:   Discussed the importance of maintaining cigarette abstinence. yes  Diagnosis Code: Personal History of Nicotine Dependence. X10.626  Information about tobacco cessation classes and interventions provided to patient. Yes  Patient provided with "ticket" for LDCT Scan. yes  Written Order for Lung Cancer Screening  with LDCT placed in Epic. Yes (CT Chest Lung Cancer Screening Low Dose W/O CM) [id number] Z12.2-Screening of respiratory organs Z87.891-Personal history of nicotine dependence  I have spent 20 minutes of face to face time with  Jesse [name] discussing the risks and benefits of lung cancer screening. We viewed a power point together that explained in detail the above noted topics. We paused at intervals to allow for questions to be asked and answered to ensure understanding.We discussed that the single most powerful action that he can take to decrease his risk of developing lung cancer is to quit smoking. We discussed whether or not he is ready to commit to setting a quit date. He is currently not ready to set a quit date. We discussed options for tools to aid in quitting smoking including nicotine replacement therapy, non-nicotine medications, support groups, Quit Smart classes, and behavior modification. We discussed that often times setting smaller, more achievable goals, such as eliminating 1 cigarette a day for a week and then 2 cigarettes a day for a week can be helpful in slowly decreasing the number of cigarettes smoked. This allows for a sense of accomplishment as well as providing a clinical benefit. I gave Jesse [name] the " Be Stronger Than Your Excuses" card with contact information for community resources, classes, free nicotine replacement therapy, and access to mobile apps, text messaging, and on-line smoking cessation help. I have also given him my card and contact information in the event he needs to contact me. We discussed the time and location of the scan, and that either June Leap, CMA, or I will call with the results within 24-48 hours of receiving them. I have provided him with a copy of the power point we viewed  as a resource in the event they need reinforcement of the concepts we discussed today in the office. The patient verbalized understanding of all of  the above and had no further  questions upon leaving the office. They have my contact information in the event they have any further questions.   Magdalen Spatz, NP

## 2015-08-20 ENCOUNTER — Other Ambulatory Visit: Payer: Self-pay | Admitting: Acute Care

## 2015-08-20 ENCOUNTER — Telehealth: Payer: Self-pay | Admitting: Acute Care

## 2015-08-20 DIAGNOSIS — F17210 Nicotine dependence, cigarettes, uncomplicated: Principal | ICD-10-CM

## 2015-08-20 NOTE — Telephone Encounter (Signed)
I called to give the patient results of low dose CT screening. There was no answer. I left contact information and requested the patient return the call for CT screening results.

## 2015-08-22 ENCOUNTER — Telehealth: Payer: Self-pay | Admitting: Acute Care

## 2015-08-22 NOTE — Telephone Encounter (Signed)
Attempted to call low-dose CT screening results. This is the second attempt to call to this patient. I left a message with my contact information requesting that he call the office for results. We will await his return call, or attempt calling one more time prior to sending a letter.

## 2015-08-23 ENCOUNTER — Telehealth: Payer: Self-pay | Admitting: Acute Care

## 2015-08-25 NOTE — Telephone Encounter (Signed)
I called to give Jesse [name] the results of his low-dose CT. There was no answer. He and I had been missing one another and attempts to talk live. I will attempt to call again in the morning. I left a message with contact information in the event he wants to try and call back.

## 2015-08-26 NOTE — Telephone Encounter (Signed)
Thank you, Judson Roch.    Will route msg back to SG to document once she is able to speak to pt.

## 2015-08-26 NOTE — Telephone Encounter (Signed)
I called again today and was able to speak with Jesse [name] about the results of his low dose CT scan. I explained that his scan was read as Lung  RADS 3, nodules that are probably benign findings, short term follow up suggested: includes nodules with a low likelihood of becoming a clinically active cancer. Radiology recommends a 6 month repeat LDCT follow up.I explained that we will schedule a follow up CT scan in 6 months, which is ordered for   January 24, 2016.He verbalized understanding of the results. I told him I will forward the information on to his PCP.He had no further questions upon ending the call.He has my contact information in the event he has questions in the future.

## 2016-02-13 ENCOUNTER — Inpatient Hospital Stay: Admission: RE | Admit: 2016-02-13 | Payer: 59 | Source: Ambulatory Visit

## 2016-02-16 ENCOUNTER — Ambulatory Visit (INDEPENDENT_AMBULATORY_CARE_PROVIDER_SITE_OTHER)
Admission: RE | Admit: 2016-02-16 | Discharge: 2016-02-16 | Disposition: A | Discharge status: Home / Self Care | Payer: 59 | Source: Ambulatory Visit | Attending: Acute Care | Admitting: Acute Care

## 2016-02-16 DIAGNOSIS — R918 Other nonspecific abnormal finding of lung field: Secondary | ICD-10-CM | POA: Diagnosis not present

## 2016-02-16 DIAGNOSIS — F17210 Nicotine dependence, cigarettes, uncomplicated: Principal | ICD-10-CM

## 2016-02-17 ENCOUNTER — Telehealth: Payer: Self-pay | Admitting: Acute Care

## 2016-02-17 DIAGNOSIS — F17210 Nicotine dependence, cigarettes, uncomplicated: Secondary | ICD-10-CM

## 2016-02-17 NOTE — Telephone Encounter (Signed)
I called to give [name] the results of his screening CT. I did leave a message on his cell phone per his request indicating that his scan was read as a lung RADS 2 Lung RADS 2: nodules that are benign in appearance and behavior with a very low likelihood of becoming a clinically active cancer due to size or lack of growth. Recommendation per radiology is for a repeat LDCT in 12 months. I explained that we will order and schedule his scan for November 2018. I also explained there was notation of aortic atherosclerosis and coronary artery calcification per his scan in addition to some emphysema. I have asked that he call the office if he has any questions or concerns regarding this incidental findings. He is currently taking a statin per his primary care physician. We will await his return call in the event he has any further questions.

## 2016-07-05 ENCOUNTER — Encounter: Payer: Self-pay | Admitting: Family Medicine

## 2016-07-05 ENCOUNTER — Ambulatory Visit (INDEPENDENT_AMBULATORY_CARE_PROVIDER_SITE_OTHER): Payer: 59 | Admitting: Family Medicine

## 2016-07-05 VITALS — BP 154/94 | HR 88 | Temp 98.4°F | Wt 191.0 lb

## 2016-07-05 DIAGNOSIS — I10 Essential (primary) hypertension: Secondary | ICD-10-CM | POA: Diagnosis not present

## 2016-07-05 DIAGNOSIS — M25511 Pain in right shoulder: Secondary | ICD-10-CM

## 2016-07-05 MED ORDER — PREDNISONE 10 MG PO TABS: ORAL_TABLET | ORAL | 0 refills | Status: DC

## 2016-07-05 MED ORDER — AMLODIPINE BESYLATE 5 MG PO TABS: 5.0000 mg | ORAL_TABLET | Freq: Every day | ORAL | 3 refills | Status: DC

## 2016-07-05 NOTE — Progress Notes (Signed)
Pre visit review using our clinic review tool, if applicable. No additional management support is needed unless otherwise documented below in the visit note. 

## 2016-07-05 NOTE — Patient Instructions (Addendum)
It was a pleasure seeing you today. Please take medication for blood pressure and continue monitoring blood pressure, document readings, and bring them with you to your next appointment in April for further evaluation. Minimal Blood Pressure Goal= AVERAGE < 140/90; Ideal is an AVERAGE < 135/85. This AVERAGE should be calculated from @ least 5-7 BP readings taken @ different times of day on different days of week. You should not respond to isolated BP readings , but rather the AVERAGE for that week .Please bring your blood pressure cuff to office visits to verify that it is reliable.It can also be checked against the blood pressure device at the pharmacy. Finger or wrist cuffs are not dependable; an arm cuff is.  Also, prednisone should be taken with food and if you do not have resolution of symptoms with treatment and therapy, imaging may be considered.   Shoulder Pain Many things can cause shoulder pain, including:  An injury.  Moving the arm in the same way again and again (overuse).  Joint pain (arthritis). Follow these instructions at home: Take these actions to help with your pain:  Squeeze a soft ball or a foam pad as much as you can. This helps to prevent swelling. It also makes the arm stronger.  Take over-the-counter and prescription medicines only as told by your doctor.  If told, put ice on the area:  Put ice in a plastic bag.  Place a towel between your skin and the bag.  Leave the ice on for 20 minutes, 2-3 times per day. Stop putting on ice if it does not help with the pain.  If you were given a shoulder sling or immobilizer:  Wear it as told.  Remove it to shower or bathe.  Move your arm as little as possible.  Keep your hand moving. This helps prevent swelling. Contact a doctor if:  Your pain gets worse.  Medicine does not help your pain.  You have new pain in your arm, hand, or fingers. Get help right away if:  Your arm, hand, or  fingers:  Tingle.  Are numb.  Are swollen.  Are painful.  Turn white or blue. This information is not intended to replace advice given to you by your health care provider. Make sure you discuss any questions you have with your health care provider. Document Released: 09/19/2007 Document Revised: 11/27/2015 Document Reviewed: 07/26/2014 Elsevier Interactive Patient Education  2017 [place].  Hypertension Hypertension, commonly called high blood pressure, is when the force of blood pumping through the arteries is too strong. The arteries are the blood vessels that carry blood from the heart throughout the body. Hypertension forces the heart to work harder to pump blood and may cause arteries to become narrow or stiff. Having untreated or uncontrolled hypertension can cause heart attacks, strokes, kidney disease, and other problems. A blood pressure reading consists of a higher number over a lower number. Ideally, your blood pressure should be below 120/80. The first ("top") number is called the systolic pressure. It is a measure of the pressure in your arteries as your heart beats. The second ("bottom") number is called the diastolic pressure. It is a measure of the pressure in your arteries as the heart relaxes. What are the causes? The cause of this condition is not known. What increases the risk? Some risk factors for high blood pressure are under your control. Others are not. Factors you can change   Smoking.  Having type 2 diabetes mellitus, high cholesterol, or  both.  Not getting enough exercise or physical activity.  Being overweight.  Having too much fat, sugar, calories, or salt (sodium) in your diet.  Drinking too much alcohol. Factors that are difficult or impossible to change   Having chronic kidney disease.  Having a family history of high blood pressure.  Age. Risk increases with age.  Race. You may be at higher risk if you are African-American.  Gender.  Men are at higher risk than women before age 106. After age 10, women are at higher risk than men.  Having obstructive sleep apnea.  Stress. What are the signs or symptoms? Extremely high blood pressure (hypertensive crisis) may cause:  Headache.  Anxiety.  Shortness of breath.  Nosebleed.  Nausea and vomiting.  Severe chest pain.  Jerky movements you cannot control (seizures). How is this diagnosed? This condition is diagnosed by measuring your blood pressure while you are seated, with your arm resting on a surface. The cuff of the blood pressure monitor will be placed directly against the skin of your upper arm at the level of your heart. It should be measured at least twice using the same arm. Certain conditions can cause a difference in blood pressure between your right and left arms. Certain factors can cause blood pressure readings to be lower or higher than normal (elevated) for a short period of time:  When your blood pressure is higher when you are in a health care provider's office than when you are at home, this is called white coat hypertension. Most people with this condition do not need medicines.  When your blood pressure is higher at home than when you are in a health care provider's office, this is called masked hypertension. Most people with this condition may need medicines to control blood pressure. If you have a high blood pressure reading during one visit or you have normal blood pressure with other risk factors:  You may be asked to return on a different day to have your blood pressure checked again.  You may be asked to monitor your blood pressure at home for 1 week or longer. If you are diagnosed with hypertension, you may have other blood or imaging tests to help your health care provider understand your overall risk for other conditions. How is this treated? This condition is treated by making healthy lifestyle changes, such as eating healthy foods,  exercising more, and reducing your alcohol intake. Your health care provider may prescribe medicine if lifestyle changes are not enough to get your blood pressure under control, and if:  Your systolic blood pressure is above 130.  Your diastolic blood pressure is above 80. Your personal target blood pressure may vary depending on your medical conditions, your age, and other factors. Follow these instructions at home: Eating and drinking   Eat a diet that is high in fiber and potassium, and low in sodium, added sugar, and fat. An example eating plan is called the DASH (Dietary Approaches to Stop Hypertension) diet. To eat this way:  Eat plenty of fresh fruits and vegetables. Try to fill half of your plate at each meal with fruits and vegetables.  Eat whole grains, such as whole wheat pasta, brown rice, or whole grain bread. Fill about one quarter of your plate with whole grains.  Eat or drink low-fat dairy products, such as skim milk or low-fat yogurt.  Avoid fatty cuts of meat, processed or cured meats, and poultry with skin. Fill about one quarter of your plate  with lean proteins, such as fish, chicken without skin, beans, eggs, and tofu.  Avoid premade and processed foods. These tend to be higher in sodium, added sugar, and fat.  Reduce your daily sodium intake. Most people with hypertension should eat less than 1,500 mg of sodium a day.  Limit alcohol intake to no more than 1 drink a day for nonpregnant women and 2 drinks a day for men. One drink equals 12 oz of beer, 5 oz of wine, or 1 oz of hard liquor. Lifestyle   Work with your health care provider to maintain a healthy body weight or to lose weight. Ask what an ideal weight is for you.  Get at least 30 minutes of exercise that causes your heart to beat faster (aerobic exercise) most days of the week. Activities may include walking, swimming, or biking.  Include exercise to strengthen your muscles (resistance exercise), such as  pilates or lifting weights, as part of your weekly exercise routine. Try to do these types of exercises for 30 minutes at least 3 days a week.  Do not use any products that contain nicotine or tobacco, such as cigarettes and e-cigarettes. If you need help quitting, ask your health care provider.  Monitor your blood pressure at home as told by your health care provider.  Keep all follow-up visits as told by your health care provider. This is important. Medicines   Take over-the-counter and prescription medicines only as told by your health care provider. Follow directions carefully. Blood pressure medicines must be taken as prescribed.  Do not skip doses of blood pressure medicine. Doing this puts you at risk for problems and can make the medicine less effective.  Ask your health care provider about side effects or reactions to medicines that you should watch for. Contact a health care provider if:  You think you are having a reaction to a medicine you are taking.  You have headaches that keep coming back (recurring).  You feel dizzy.  You have swelling in your ankles.  You have trouble with your vision. Get help right away if:  You develop a severe headache or confusion.  You have unusual weakness or numbness.  You feel faint.  You have severe pain in your chest or abdomen.  You vomit repeatedly.  You have trouble breathing. Summary  Hypertension is when the force of blood pumping through your arteries is too strong. If this condition is not controlled, it may put you at risk for serious complications.  Your personal target blood pressure may vary depending on your medical conditions, your age, and other factors. For most people, a normal blood pressure is less than 120/80.  Hypertension is treated with lifestyle changes, medicines, or a combination of both. Lifestyle changes include weight loss, eating a healthy, low-sodium diet, exercising more, and limiting  alcohol. This information is not intended to replace advice given to you by your health care provider. Make sure you discuss any questions you have with your health care provider. Document Released: 04/02/2005 Document Revised: 02/29/2016 Document Reviewed: 02/29/2016 Elsevier Interactive Patient Education  2017 [place] NOW OFFER   [facility] FAST TRACK!!!  SAME DAY Appointments for ACUTE CARE  Such as: Sprains, Injuries, cuts, abrasions, rashes, muscle pain, joint pain, back pain Colds, flu, sore throats, headache, allergies, cough, fever  Ear pain, sinus and eye infections Abdominal pain, nausea, vomiting, diarrhea, upset stomach Animal/insect bites  3 Easy Ways to Schedule: Walk-In Scheduling Call in scheduling  [facility] Sign-up: [email or web address]

## 2016-07-05 NOTE — Progress Notes (Signed)
Subjective:    Patient ID: Jesse [name], male    DOB: 04-22-1950, 66 y.o.   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   He has been evaluated by 2 chiropractors who have been treating him with moderate benefit.  He has been sent here by his chiropractor requesting evaluation and possible treatment with steroids in addition to the treatment. Pain is noted as an 8 but can range from 3 to 4 to an 8. He reports radiation of pain down to his forearm with associated tingling.  He denies fever, chills, sweats, N/V/D, numbness, or weakness. Treatment with advil has provided moderate benefit.  Elevated blood pressure: He reports that he monitors his BP daily and notes that readings average 947M systolic and low 54Y diastolic. He reports a low salt diet and follows a heart healthy diet. He denies chest pain, palpitations, SOB, headaches, numbness, weakness, or edema. He is a smoker; smokes 5 cigarettes/day. He has decreased from 2 ppd with nicotine gum.  Review of Systems  Constitutional: Negative for chills, fatigue and fever.  Respiratory: Negative for cough, shortness of breath and wheezing.   Cardiovascular: Negative for chest pain and palpitations.  Gastrointestinal: Negative for abdominal pain, diarrhea, nausea and vomiting.  Musculoskeletal:       Right shoulder pain  Skin: Negative for rash.  Neurological: Negative for dizziness, light-headedness, numbness and headaches.   Past Medical History:  Diagnosis Date  . DIVERTICULOSIS, COLON 01/14/2007   Qualifier: Diagnosis of  By: Paulina Fusi RN, [name]   . GASTRITIS, CHRONIC 06/01/2004   Qualifier: Diagnosis of  By: Nolon Rod CMA (AAMA), Robin    . GERD 01/09/2007   Qualifier: Diagnosis of  By: Scherrie Gerlach    . HYPERLIPIDEMIA 01/09/2007   Qualifier: Diagnosis of   By: Scherrie Gerlach    . NEOPLASM, MALIGNANT, BLADDER, HX OF 07/18/2007   Qualifier: Diagnosis of  By: Nolon Rod CMA (AAMA), Robin    . OSTEOARTHRITIS 01/14/2007   Qualifier: Diagnosis of  By: Paulina Fusi RN, [name]   . Reflux esophagitis 06/01/2004   Qualifier: Diagnosis of  By: Nolon Rod CMA (AAMA), Robin    . TOBACCO USER 02/16/2009   Qualifier: Diagnosis of  By: Burnice Logan  MD, Doretha Sou   . VITAMIN D DEFICIENCY 06/03/2007   Qualifier: Diagnosis of  By: Burnice Logan  MD, Doretha Sou      Social History   Social History  . Marital status: Single    Spouse name: N/A  . Number of children: N/A  . Years of education: N/A   Occupational History  . Not on file.   Social History Main Topics  . Smoking status: Current Every Day Smoker    Packs/day: 1.75    Years: 40.00    Types: Cigarettes  . Smokeless tobacco: Never Used  . Alcohol use 0.0 oz/week  . Drug use: No  . Sexual activity: Not on file   Other Topics Concern  . Not on file   Social History Narrative  . No narrative on file    Past Surgical History:  Procedure Laterality Date  . KNEE SURGERY     bilat.    Family History  Problem Relation Age of Onset  . Diabetes Father     No Known Allergies  Current Outpatient Prescriptions on File Prior to Visit  Medication Sig Dispense Refill  . atorvastatin (LIPITOR) 40 MG tablet Take 1 tablet (40 mg total) by mouth daily at 6 PM. 90 tablet 3  . omeprazole (PRILOSEC) 40 MG capsule Take 1 capsule (40 mg total) by mouth daily. 90 capsule 3  . tadalafil (CIALIS) 20 MG tablet Take 1 tablet (20 mg total) by mouth daily as needed for erectile dysfunction. 6 tablet 2  . VENTOLIN HFA 108 (90 Base) MCG/ACT inhaler INHALE 1-2 PUFFS EVERY 4 TO 6 HOURS AS NEEDED FOR COUGH/SHORTNESS OF BREATH  0   No current facility-administered medications on file prior to visit.     BP (!) 154/94   Pulse 88   Temp 98.4 F (36.9 C) (Oral)   Wt 191 lb (86.6 kg)   SpO2 97%   BMI 28.41 kg/m        Objective:   Physical Exam  Constitutional: He is oriented to person, place, and time. He appears well-developed and well-nourished.  Eyes: Pupils are equal, round, and reactive to light. No scleral icterus.  Neck: Neck supple.  Cardiovascular: Normal rate and regular rhythm.   Pulmonary/Chest: Effort normal and breath sounds normal. He has no wheezes. He has no rales.  Abdominal: Soft. Bowel sounds are normal. There is no tenderness.  Musculoskeletal: He exhibits no edema.  Shoulder: Inspection reveals no abnormalities, atrophy or asymmetry. Palpation is normal with no tenderness over AC joint or bicipital groove. ROM is full in all planes. Pain is noted at 120 degrees Rotator cuff strength normal throughout. No signs of impingement with negative Neer and Hawkin's tests, empty can. Normal scapular function observed. Positive arc test at  and no drop arm sign.   Lymphadenopathy:    He has no cervical adenopathy.  Neurological: He is alert and oriented to person, place, and time.  Skin: Skin is warm and dry. No rash noted.  Psychiatric: He has a normal mood and affect. His behavior is normal. Judgment and thought content normal.          Assessment & Plan:  1. Hypertension, essential Elevated Blood pressure with monitoring at home and in office today; he reports being advised by his PCP to monitor his blood pressure and follow up with elevated readings. He reports elevated readings consistently; initiate amlodipine; he will follow up with PCP April 10th for further evaluation of blood pressure. DASH diet reviewed - amLODipine (NORVASC) 5 MG tablet; Take 1 tablet (5 mg total) by mouth daily.  Dispense: 90 tablet; Refill: 3  2. Acute pain of right shoulder Exam is reassuring; we discussed that imaging will be considered after trial of prednisone or if new symptoms present as he had a trigger of a slip and catching himself with his hands. He declined any imaging today; He is  interested in trial of prednisone prior to imaging and plans to continue treatment with his chiropractor and massage therapist.  - predniSONE (DELTASONE) 10 MG tablet; Take 4 tablets daily for 4 days, 3 tabs daily for 2 days, 2 tabs daily for 2 days, and one tab daily for 2 days.  Dispense: 28 tablet; Refill: 0   Follow up with PCP in early April as discussed for evaluation of blood pressure or sooner if symptoms of shoulder pain does not improve with treatment.  Delano Metz, FNP-C

## 2016-07-15 ENCOUNTER — Other Ambulatory Visit: Payer: Self-pay | Admitting: Internal Medicine

## 2016-09-28 ENCOUNTER — Ambulatory Visit (INDEPENDENT_AMBULATORY_CARE_PROVIDER_SITE_OTHER): Payer: 59 | Admitting: Internal Medicine

## 2016-09-28 ENCOUNTER — Encounter: Payer: Self-pay | Admitting: Internal Medicine

## 2016-09-28 VITALS — BP 128/78 | HR 76 | Temp 98.1°F | Ht 68.0 in | Wt 183.6 lb

## 2016-09-28 DIAGNOSIS — I2510 Atherosclerotic heart disease of native coronary artery without angina pectoris: Secondary | ICD-10-CM | POA: Insufficient documentation

## 2016-09-28 DIAGNOSIS — R7989 Other specified abnormal findings of blood chemistry: Secondary | ICD-10-CM

## 2016-09-28 DIAGNOSIS — I2584 Coronary atherosclerosis due to calcified coronary lesion: Secondary | ICD-10-CM

## 2016-09-28 DIAGNOSIS — I10 Essential (primary) hypertension: Secondary | ICD-10-CM | POA: Diagnosis not present

## 2016-09-28 DIAGNOSIS — Z23 Encounter for immunization: Secondary | ICD-10-CM | POA: Diagnosis not present

## 2016-09-28 DIAGNOSIS — R7302 Impaired glucose tolerance (oral): Secondary | ICD-10-CM | POA: Insufficient documentation

## 2016-09-28 DIAGNOSIS — Z0000 Encounter for general adult medical examination without abnormal findings: Secondary | ICD-10-CM | POA: Diagnosis not present

## 2016-09-28 LAB — CBC WITH DIFFERENTIAL/PLATELET
BASOS PCT: 0.4 % (ref 0.0–3.0)
Basophils Absolute: 0 10*3/uL (ref 0.0–0.1)
EOS ABS: 0.1 10*3/uL (ref 0.0–0.7)
EOS PCT: 1.5 % (ref 0.0–5.0)
HCT: 44.1 % (ref 39.0–52.0)
Hemoglobin: 15 g/dL (ref 13.0–17.0)
LYMPHS ABS: 1.6 10*3/uL (ref 0.7–4.0)
Lymphocytes Relative: 22.2 % (ref 12.0–46.0)
MCHC: 34 g/dL (ref 30.0–36.0)
MCV: 95.3 fl (ref 78.0–100.0)
MONO ABS: 0.3 10*3/uL (ref 0.1–1.0)
Monocytes Relative: 4.9 % (ref 3.0–12.0)
Neutro Abs: 5 10*3/uL (ref 1.4–7.7)
Neutrophils Relative %: 71 % (ref 43.0–77.0)
Platelets: 151 10*3/uL (ref 150.0–400.0)
RBC: 4.63 Mil/uL (ref 4.22–5.81)
RDW: 14.5 % (ref 11.5–15.5)
WBC: 7.1 10*3/uL (ref 4.0–10.5)

## 2016-09-28 LAB — LDL CHOLESTEROL, DIRECT: LDL DIRECT: 103 mg/dL

## 2016-09-28 LAB — COMPREHENSIVE METABOLIC PANEL
ALBUMIN: 4.3 g/dL (ref 3.5–5.2)
ALT: 16 U/L (ref 0–53)
AST: 17 U/L (ref 0–37)
Alkaline Phosphatase: 68 U/L (ref 39–117)
BILIRUBIN TOTAL: 0.6 mg/dL (ref 0.2–1.2)
BUN: 23 mg/dL (ref 6–23)
CALCIUM: 9.8 mg/dL (ref 8.4–10.5)
CHLORIDE: 105 meq/L (ref 96–112)
CO2: 30 mEq/L (ref 19–32)
CREATININE: 0.81 mg/dL (ref 0.40–1.50)
GFR: 100.97 mL/min (ref >60.00)
Glucose, Bld: 110 mg/dL — ABNORMAL HIGH (ref 70–99)
Potassium: 3.9 mEq/L (ref 3.5–5.1)
SODIUM: 142 meq/L (ref 135–145)
Total Protein: 6.6 g/dL (ref 6.0–8.3)

## 2016-09-28 LAB — LIPID PANEL
CHOLESTEROL: 199 mg/dL (ref 0–200)
HDL: 34.8 mg/dL — ABNORMAL LOW (ref >39.00)
NonHDL: 164.58
Total CHOL/HDL Ratio: 6
Triglycerides: 345 mg/dL — ABNORMAL HIGH (ref 0.0–149.0)
VLDL: 69 mg/dL — AB (ref 0.0–40.0)

## 2016-09-28 LAB — TSH: TSH: 1 u[IU]/mL (ref 0.35–4.50)

## 2016-09-28 LAB — PSA: PSA: 1.5 ng/mL (ref 0.10–4.00)

## 2016-09-28 NOTE — Patient Instructions (Signed)
Limit your sodium (Salt) intake  Please check your blood pressure on a regular basis.  If it is consistently greater than 150/90, please make an office appointment.    It is important that you exercise regularly, at least 20 minutes 3 to 4 times per week.  If you develop chest pain or shortness of breath seek  medical attention.  Schedule your colonoscopy to help detect colon cancer.  Return in one year for follow-up

## 2016-09-28 NOTE — Progress Notes (Signed)
Subjective:    Patient ID: Jesse [name], male    DOB: 28-Apr-1950, 66 y.o.   MRN: [id number]  HPI 66 year old patient who is seen today for a preventive health examination. He was placed on amlodipine earlier due to stage II hypertension.  He has a history of impaired glucose tolerance for number of years.  He has a low volumes smoker and now is down to 3-4 cigarettes per day.  He had a follow-up low-dose chest CT approximate 7 months ago that was unremarkable. This did reveal coronary artery calcification.  He is on statin therapy for dyslipidemia. He has significant osteoarthritis and is status post bilateral total knee replacement surgeries. He fell early in the year and had some radicular symptoms involving the right neck, shoulder and arm.  He still has some tingling involving his right index finger and some mild right arm weakness that continues to improve  Needs follow-up colonoscopy  Family history of father died of complications of a heart attack following a motor vehicle accident 24.  Mother died at 76 and had dementia  Past Medical History:  Diagnosis Date  . DIVERTICULOSIS, COLON 01/14/2007   Qualifier: Diagnosis of  By: Paulina Fusi RN, [name]   . GASTRITIS, CHRONIC 06/01/2004   Qualifier: Diagnosis of  By: Nolon Rod CMA (AAMA), Robin    . GERD 01/09/2007   Qualifier: Diagnosis of  By: Scherrie Gerlach    . HYPERLIPIDEMIA 01/09/2007   Qualifier: Diagnosis of  By: Scherrie Gerlach    . NEOPLASM, MALIGNANT, BLADDER, HX OF 07/18/2007   Qualifier: Diagnosis of  By: Nolon Rod CMA (AAMA), Robin    . OSTEOARTHRITIS 01/14/2007   Qualifier: Diagnosis of  By: Paulina Fusi RN, [name]   . Reflux esophagitis 06/01/2004   Qualifier: Diagnosis of  By: Nolon Rod CMA (AAMA), Robin    . TOBACCO USER 02/16/2009   Qualifier: Diagnosis of  By: Burnice Logan  MD, Doretha Sou   . VITAMIN D DEFICIENCY 06/03/2007   Qualifier: Diagnosis of  By: Burnice Logan  MD, Doretha Sou      Social History   Social History  .  Marital status: Single    Spouse name: N/A  . Number of children: N/A  . Years of education: N/A   Occupational History  . Not on file.   Social History Main Topics  . Smoking status: Current Every Day Smoker    Packs/day: 1.75    Years: 40.00    Types: Cigarettes  . Smokeless tobacco: Never Used  . Alcohol use 0.0 oz/week  . Drug use: No  . Sexual activity: Not on file   Other Topics Concern  . Not on file   Social History Narrative  . No narrative on file    Past Surgical History:  Procedure Laterality Date  . KNEE SURGERY     bilat.    Family History  Problem Relation Age of Onset  . Diabetes Father     No Known Allergies  Current Outpatient Prescriptions on File Prior to Visit  Medication Sig Dispense Refill  . amLODipine (NORVASC) 5 MG tablet Take 1 tablet (5 mg total) by mouth daily. 90 tablet 3  . atorvastatin (LIPITOR) 40 MG tablet Take 1 tablet (40 mg total) by mouth daily at 6 PM. 90 tablet 3  . omeprazole (PRILOSEC) 40 MG capsule TAKE 1 CAPSULE BY MOUTH  DAILY 90 capsule 1  . tadalafil (CIALIS) 20 MG tablet Take 1 tablet (20 mg total) by mouth daily as needed  for erectile dysfunction. 6 tablet 2   No current facility-administered medications on file prior to visit.     BP 128/78 (BP Location: Left Arm, Patient Position: Sitting, Cuff Size: Normal)   Pulse 76   Temp 98.1 F (36.7 C) (Oral)   Ht 5\' 8"  (1.727 m)   Wt 183 lb 9.6 oz (83.3 kg)   SpO2 98%   BMI 27.92 kg/m      Review of Systems  Constitutional: Negative for appetite change, chills, fatigue and fever.  HENT: Negative for congestion, dental problem, ear pain, hearing loss, sore throat, tinnitus, trouble swallowing and voice change.   Eyes: Negative for pain, discharge and visual disturbance.  Respiratory: Negative for cough, chest tightness, wheezing and stridor.   Cardiovascular: Negative for chest pain, palpitations and leg swelling.  Gastrointestinal: Negative for abdominal  distention, abdominal pain, blood in stool, constipation, diarrhea, nausea and vomiting.  Genitourinary: Negative for difficulty urinating, discharge, flank pain, genital sores, hematuria and urgency.  Musculoskeletal: Negative for arthralgias, back pain, gait problem, joint swelling, myalgias and neck stiffness.  Skin: Negative for rash.  Neurological: Positive for weakness and numbness. Negative for dizziness, syncope, speech difficulty and headaches.  Hematological: Negative for adenopathy. Does not bruise/bleed easily.  Psychiatric/Behavioral: Negative for behavioral problems and dysphoric mood. The patient is not nervous/anxious.        Objective:   Physical Exam  Constitutional: He appears well-developed and well-nourished.  HENT:  Head: Normocephalic and atraumatic.  Right Ear: External ear normal.  Left Ear: External ear normal.  Nose: Nose normal.  Mouth/Throat: Oropharynx is clear and moist.  Eyes: Conjunctivae and EOM are normal. Pupils are equal, round, and reactive to light. No scleral icterus.  Neck: Normal range of motion. Neck supple. No JVD present. No thyromegaly present.  Cardiovascular: Regular rhythm, normal heart sounds and intact distal pulses.  Exam reveals no gallop and no friction rub.   No murmur heard. The left dorsalis pedis and the right posterior tibial pulse diminished  Pulmonary/Chest: Effort normal and breath sounds normal. He exhibits no tenderness.  Abdominal: Soft. Bowel sounds are normal. He exhibits no distension and no mass. There is no tenderness.  Genitourinary: Penis normal.  Genitourinary Comments: Prostate plus 2  Musculoskeletal: Normal range of motion. He exhibits no edema or tenderness.  Lymphadenopathy:    He has no cervical adenopathy.  Neurological: He is alert. He has normal reflexes. No cranial nerve deficit. Coordination normal.  Mild weakness in right grip strength and right arm extension  Skin: Skin is warm and dry. No rash  noted.  Surgical scars both knees  Psychiatric: He has a normal mood and affect. His behavior is normal.          Assessment & Plan:   Preventive health examination Essential hypertension, stable.  Continue amlodipine Dyslipidemia.  Continue statin therapy Resolving  cervical radiculopathy.  We'll continue to observe.  Will report any clinical worsening Ongoing tobacco use.  Continue efforts at total smoking cessation Osteoarthritis  Schedule colonoscopy Review lab Continue home blood pressure monitoring  Follow-up here one year  Nyoka Cowden

## 2016-09-29 LAB — HEPATITIS C ANTIBODY: HCV AB: NEGATIVE

## 2016-10-03 ENCOUNTER — Encounter: Payer: Self-pay | Admitting: Internal Medicine

## 2016-11-27 ENCOUNTER — Ambulatory Visit (AMBULATORY_SURGERY_CENTER): Payer: Self-pay

## 2016-11-27 VITALS — Ht 69.5 in | Wt 185.6 lb

## 2016-11-27 DIAGNOSIS — Z1211 Encounter for screening for malignant neoplasm of colon: Secondary | ICD-10-CM

## 2016-11-27 NOTE — Progress Notes (Signed)
Per pt, no allergies to soy or egg products.Pt not taking any weight loss meds or using  O2 at home.   Pt refused Emmi video. 

## 2016-11-28 ENCOUNTER — Encounter: Payer: Self-pay | Admitting: Internal Medicine

## 2016-12-11 ENCOUNTER — Ambulatory Visit (AMBULATORY_SURGERY_CENTER): Payer: 59 | Admitting: Internal Medicine

## 2016-12-11 ENCOUNTER — Encounter: Payer: Self-pay | Admitting: Internal Medicine

## 2016-12-11 VITALS — BP 139/73 | HR 59 | Temp 98.6°F | Resp 13 | Ht 69.5 in | Wt 185.0 lb

## 2016-12-11 DIAGNOSIS — Z1212 Encounter for screening for malignant neoplasm of rectum: Secondary | ICD-10-CM | POA: Diagnosis not present

## 2016-12-11 DIAGNOSIS — Z1211 Encounter for screening for malignant neoplasm of colon: Secondary | ICD-10-CM

## 2016-12-11 MED ORDER — SODIUM CHLORIDE 0.9 % IV SOLN: 500.0000 mL | INTRAVENOUS | Status: DC

## 2016-12-11 NOTE — Patient Instructions (Addendum)
No polyps or cancer seen.  You do have diverticulosis - thickened muscle rings and pouches in the colon wall. Please read the handout about this condition.  You and your primary care provider can make a decision about what to do for colon cancer screening at 78 in 10 years. I am not going to recommend a routine repeat colonoscopy at this time.  I appreciate the opportunity to care for you. Gatha Mayer, MD, FACG   YOU HAD AN ENDOSCOPIC PROCEDURE TODAY AT [facility]:   Refer to the procedure report that was given to you for any specific questions about what was found during the examination.  If the procedure report does not answer your questions, please call your gastroenterologist to clarify.  If you requested that your care partner not be given the details of your procedure findings, then the procedure report has been included in a sealed envelope for you to review at your convenience later.  YOU SHOULD EXPECT: Some feelings of bloating in the abdomen. Passage of more gas than usual.  Walking can help get rid of the air that was put into your GI tract during the procedure and reduce the bloating. If you had a lower endoscopy (such as a colonoscopy or flexible sigmoidoscopy) you may notice spotting of blood in your stool or on the toilet paper. If you underwent a bowel prep for your procedure, you may not have a normal bowel movement for a few days.  Please Note:  You might notice some irritation and congestion in your nose or some drainage.  This is from the oxygen used during your procedure.  There is no need for concern and it should clear up in a day or so.  SYMPTOMS TO REPORT IMMEDIATELY:   Following lower endoscopy (colonoscopy or flexible sigmoidoscopy):  Excessive amounts of blood in the stool  Significant tenderness or worsening of abdominal pains  Swelling of the abdomen that is new, acute  Fever of 100F or higher   For urgent or emergent issues, a  gastroenterologist can be reached at any hour by calling [phone].  Please read all handout given to you by your recovery nurse.  DIET:  We do recommend a small meal at first, but then you may proceed to your regular diet.  Drink plenty of fluids but you should avoid alcoholic beverages for 24 hours.  ACTIVITY:  You should plan to take it easy for the rest of today and you should NOT DRIVE or use heavy machinery until tomorrow (because of the sedation medicines used during the test).    FOLLOW UP: Our staff will call the number listed on your records the next business day following your procedure to check on you and address any questions or concerns that you may have regarding the information given to you following your procedure. If we do not reach you, we will leave a message.  However, if you are feeling well and you are not experiencing any problems, there is no need to return our call.  We will assume that you have returned to your regular daily activities without incident.  If any biopsies were taken you will be contacted by phone or by letter within the next 1-3 weeks.  Please call us at [phone] if you have not heard about the biopsies in 3 weeks.    SIGNATURES/CONFIDENTIALITY: You and/or your care partner have signed paperwork which will be entered into your electronic medical record.  These signatures  attest to the fact that that the information above on your After Visit Summary has been reviewed and is understood.  Full responsibility of the confidentiality of this discharge information lies with you and/or your care-partner.  Thank you for letting us take care of your healthcare needs today.

## 2016-12-11 NOTE — Progress Notes (Signed)
A and O x3. Report to RN. Tolerated MAC anesthesia well.

## 2016-12-11 NOTE — Op Note (Signed)
[facility] [facility] Patient Name: Jesse [name] Procedure Date: 12/11/2016 8:13 AM MRN: [id number] Endoscopist: Gatha Mayer , MD Age: 66 Referring MD:  Date of Birth: 28-Jul-1950 Gender: Male Account #: [id number] Procedure:                Colonoscopy Indications:              Screening for colorectal malignant neoplasm, Last                            colonoscopy: 2005 Medicines:                Propofol per Anesthesia, Monitored Anesthesia Care Procedure:                Pre-Anesthesia Assessment:                           - Prior to the procedure, a History and Physical                            was performed, and patient medications and                            allergies were reviewed. The patient's tolerance of                            previous anesthesia was also reviewed. The risks                            and benefits of the procedure and the sedation                            options and risks were discussed with the patient.                            All questions were answered, and informed consent                            was obtained. Prior Anticoagulants: The patient has                            taken no previous anticoagulant or antiplatelet                            agents. ASA Grade Assessment: II - A patient with                            mild systemic disease. After reviewing the risks                            and benefits, the patient was deemed in                            satisfactory condition to undergo the procedure.  After obtaining informed consent, the colonoscope                            was passed under direct vision. Throughout the                            procedure, the patient's blood pressure, pulse, and                            oxygen saturations were monitored continuously. The                            Colonoscope was introduced through the anus and                            advanced to the the  cecum, identified by                            appendiceal orifice and ileocecal valve. The                            colonoscopy was performed without difficulty. The                            patient tolerated the procedure well. The quality                            of the bowel preparation was good. The bowel                            preparation used was Miralax. The ileocecal valve,                            appendiceal orifice, and rectum were photographed. Scope In: 8:16:05 AM Scope Out: 8:30:39 AM Scope Withdrawal Time: 0 hours 10 minutes 28 seconds  Total Procedure Duration: 0 hours 14 minutes 34 seconds  Findings:                 The perianal and digital rectal examinations were                            normal. Pertinent negatives include normal prostate                            (size, shape, and consistency).                           Many small and large-mouthed diverticula were found                            in the sigmoid colon and descending colon.                           The exam was otherwise without abnormality on  direct and retroflexion views. Complications:            No immediate complications. Estimated Blood Loss:     Estimated blood loss: none. Impression:               - Moderate diverticulosis in the sigmoid colon and                            in the descending colon.                           - The examination was otherwise normal on direct                            and retroflexion views.                           - No specimens collected. Recommendation:           - Patient has a contact number available for                            emergencies. The signs and symptoms of potential                            delayed complications were discussed with the                            patient. Return to normal activities tomorrow.                            Written discharge instructions were provided to the                             patient.                           - Continue present medications.                           - No repeat colonoscopy due to age and the absence                            of colonic polyps.                           - Resume previous diet. Gatha Mayer, MD 12/11/2016 8:35:25 AM This report has been signed electronically.

## 2016-12-12 ENCOUNTER — Telehealth: Payer: Self-pay

## 2016-12-12 NOTE — Telephone Encounter (Signed)
  Follow up Call-  Call back number 12/11/2016  Post procedure Call Back phone  # [phone]  Permission to leave phone message Yes  Some recent data might be hidden     Patient questions:  Do you have a fever, pain , or abdominal swelling? No. Pain Score  0 *  Have you tolerated food without any problems? Yes.    Have you been able to return to your normal activities? Yes.    Do you have any questions about your discharge instructions: Diet   No. Medications  No. Follow up visit  No.  Do you have questions or concerns about your Care? No.  Actions: * If pain score is 4 or above: No action needed, pain <4.

## 2016-12-28 ENCOUNTER — Other Ambulatory Visit: Payer: Self-pay | Admitting: Internal Medicine

## 2017-01-22 ENCOUNTER — Encounter: Payer: Self-pay | Admitting: Internal Medicine

## 2017-01-23 ENCOUNTER — Telehealth: Payer: Self-pay | Admitting: Internal Medicine

## 2017-01-23 NOTE — Telephone Encounter (Signed)
Please note

## 2017-01-23 NOTE — Telephone Encounter (Signed)
Pt calling stating that he sent a msg to Dr. Raliegh Ip and really would like to speak with him concerning a personal matter.

## 2017-02-18 ENCOUNTER — Ambulatory Visit (INDEPENDENT_AMBULATORY_CARE_PROVIDER_SITE_OTHER)
Admission: RE | Admit: 2017-02-18 | Discharge: 2017-02-18 | Disposition: A | Discharge status: Home / Self Care | Payer: 59 | Source: Ambulatory Visit | Attending: Acute Care | Admitting: Acute Care

## 2017-02-18 DIAGNOSIS — Z87891 Personal history of nicotine dependence: Secondary | ICD-10-CM

## 2017-02-18 DIAGNOSIS — F17210 Nicotine dependence, cigarettes, uncomplicated: Secondary | ICD-10-CM

## 2017-02-19 ENCOUNTER — Other Ambulatory Visit: Payer: Self-pay | Admitting: Acute Care

## 2017-02-19 DIAGNOSIS — Z122 Encounter for screening for malignant neoplasm of respiratory organs: Secondary | ICD-10-CM

## 2017-02-19 DIAGNOSIS — F17210 Nicotine dependence, cigarettes, uncomplicated: Principal | ICD-10-CM

## 2017-03-18 ENCOUNTER — Other Ambulatory Visit: Payer: Self-pay | Admitting: Internal Medicine

## 2017-07-03 ENCOUNTER — Other Ambulatory Visit: Payer: Self-pay | Admitting: Family Medicine

## 2017-07-03 DIAGNOSIS — I10 Essential (primary) hypertension: Secondary | ICD-10-CM

## 2017-07-30 ENCOUNTER — Other Ambulatory Visit: Payer: Self-pay

## 2017-07-30 ENCOUNTER — Telehealth: Payer: Self-pay | Admitting: Internal Medicine

## 2017-07-30 DIAGNOSIS — I10 Essential (primary) hypertension: Secondary | ICD-10-CM

## 2017-07-30 MED ORDER — AMLODIPINE BESYLATE 5 MG PO TABS: 5.0000 mg | ORAL_TABLET | Freq: Every day | ORAL | 0 refills | Status: DC

## 2017-07-30 NOTE — Telephone Encounter (Signed)
Copied from [place] [id number]. Topic: Quick Communication - Rx Refill/Question >> Jul 30, 2017  2:18 PM Cleaster Corin, [place] wrote: Medication:amLODipine [facility]) 5 MG tablet [[id number]]  Has the patient contacted their pharmacy? yes (Agent: If no, request that the patient contact the pharmacy for the refill.) Preferred Pharmacy (with phone number or street name):[id number] - [place], [place] - [address] [place] [address] Phone: [phone] Fax: [phone]   Agent: Please be advised that RX refills may take up to 3 business days. We ask that you follow-up with your pharmacy.

## 2017-10-08 ENCOUNTER — Ambulatory Visit (INDEPENDENT_AMBULATORY_CARE_PROVIDER_SITE_OTHER): Payer: BLUE CROSS/BLUE SHIELD | Admitting: Internal Medicine

## 2017-10-08 ENCOUNTER — Encounter: Payer: Self-pay | Admitting: Internal Medicine

## 2017-10-08 VITALS — BP 140/80 | HR 66 | Temp 98.1°F | Wt 180.0 lb

## 2017-10-08 DIAGNOSIS — Z0000 Encounter for general adult medical examination without abnormal findings: Secondary | ICD-10-CM

## 2017-10-08 DIAGNOSIS — Z125 Encounter for screening for malignant neoplasm of prostate: Secondary | ICD-10-CM

## 2017-10-08 DIAGNOSIS — I10 Essential (primary) hypertension: Secondary | ICD-10-CM | POA: Diagnosis not present

## 2017-10-08 LAB — COMPREHENSIVE METABOLIC PANEL WITH GFR
ALT: 18 U/L (ref 0–53)
AST: 19 U/L (ref 0–37)
Albumin: 4.5 g/dL (ref 3.5–5.2)
Alkaline Phosphatase: 71 U/L (ref 39–117)
BUN: 18 mg/dL (ref 6–23)
CO2: 30 meq/L (ref 19–32)
Calcium: 9.9 mg/dL (ref 8.4–10.5)
Chloride: 105 meq/L (ref 96–112)
Creatinine, Ser: 0.79 mg/dL (ref 0.40–1.50)
GFR: 103.61 mL/min
Glucose, Bld: 115 mg/dL — ABNORMAL HIGH (ref 70–99)
Potassium: 5 meq/L (ref 3.5–5.1)
Sodium: 142 meq/L (ref 135–145)
Total Bilirubin: 0.6 mg/dL (ref 0.2–1.2)
Total Protein: 6.9 g/dL (ref 6.0–8.3)

## 2017-10-08 LAB — LIPID PANEL
Cholesterol: 220 mg/dL — ABNORMAL HIGH (ref 0–200)
HDL: 44.3 mg/dL
NonHDL: 175.37
Total CHOL/HDL Ratio: 5
Triglycerides: 222 mg/dL — ABNORMAL HIGH (ref 0.0–149.0)
VLDL: 44.4 mg/dL — ABNORMAL HIGH (ref 0.0–40.0)

## 2017-10-08 LAB — CBC WITH DIFFERENTIAL/PLATELET
BASOS PCT: 0.5 % (ref 0.0–3.0)
Basophils Absolute: 0 10*3/uL (ref 0.0–0.1)
EOS ABS: 0.1 10*3/uL (ref 0.0–0.7)
Eosinophils Relative: 1.9 % (ref 0.0–5.0)
HCT: 44.5 % (ref 39.0–52.0)
Hemoglobin: 15.4 g/dL (ref 13.0–17.0)
Lymphocytes Relative: 24.3 % (ref 12.0–46.0)
Lymphs Abs: 1.4 10*3/uL (ref 0.7–4.0)
MCHC: 34.6 g/dL (ref 30.0–36.0)
MCV: 97.3 fl (ref 78.0–100.0)
MONO ABS: 0.3 10*3/uL (ref 0.1–1.0)
Monocytes Relative: 5.5 % (ref 3.0–12.0)
NEUTROS ABS: 3.9 10*3/uL (ref 1.4–7.7)
Neutrophils Relative %: 67.8 % (ref 43.0–77.0)
Platelets: 138 10*3/uL — ABNORMAL LOW (ref 150.0–400.0)
RBC: 4.57 Mil/uL (ref 4.22–5.81)
RDW: 13.6 % (ref 11.5–15.5)
WBC: 5.7 10*3/uL (ref 4.0–10.5)

## 2017-10-08 LAB — TSH: TSH: 1.23 u[IU]/mL (ref 0.35–4.50)

## 2017-10-08 LAB — PSA: PSA: 1.66 ng/mL (ref 0.10–4.00)

## 2017-10-08 LAB — LDL CHOLESTEROL, DIRECT: Direct LDL: 119 mg/dL

## 2017-10-08 MED ORDER — OMEPRAZOLE 40 MG PO CPDR: 40.0000 mg | DELAYED_RELEASE_CAPSULE | Freq: Every day | ORAL | 5 refills | Status: DC

## 2017-10-08 MED ORDER — ATORVASTATIN CALCIUM 40 MG PO TABS: ORAL_TABLET | ORAL | 3 refills | Status: DC

## 2017-10-08 MED ORDER — AMLODIPINE BESYLATE 5 MG PO TABS: 5.0000 mg | ORAL_TABLET | Freq: Every day | ORAL | 6 refills | Status: DC

## 2017-10-08 MED ORDER — TADALAFIL 20 MG PO TABS: 20.0000 mg | ORAL_TABLET | Freq: Every day | ORAL | 6 refills | Status: DC | PRN

## 2017-10-08 NOTE — Patient Instructions (Signed)
Limit your sodium (Salt) intake  Please check your blood pressure on a regular basis.  If it is consistently greater than 140/90, please make an office appointment.    It is important that you exercise regularly, at least 20 minutes 3 to 4 times per week.  If you develop chest pain or shortness of breath seek  medical attention.  Smoking tobacco is very bad for your health. You should stop smoking immediately.  Low-dose chest CT scanning in November as scheduled.

## 2017-10-08 NOTE — Progress Notes (Signed)
   Subjective:    Patient ID: Jesse [name], male    DOB: [birth year], 67 y.o.   MRN: [id number]  HPI  67 year old patient who is seen today for his annual preventive health examination. He had follow-up colonoscopy in August 2018 with normal results except for some moderate diverticular disease. He is on statin therapy due to coronary artery calcification.  He has essential hypertension and remains on amlodipine.  He has a history of impaired glucose tolerance. Follow-up low-dose chest CT unchanged in November 2018  He has a history of osteoarthritis and is followed by orthopedics.  He  has had bilateral total knee replacement surgeries  Family history of father died of complications of a heart attack following a motor vehicle accident 72.  Mother died at 63 and had dementia.  One brother with a history of diabetes and obesity  No exercise limitations.  No exertional chest pain or DOE   Review of Systems  Constitutional: Negative for appetite change, chills, fatigue and fever.  HENT: Negative for congestion, dental problem, ear pain, hearing loss, sore throat, tinnitus, trouble swallowing and voice change.   Eyes: Negative for pain, discharge and visual disturbance.  Respiratory: Negative for cough, chest tightness, wheezing and stridor.   Cardiovascular: Negative for chest pain, palpitations and leg swelling.  Gastrointestinal: Negative for abdominal distention, abdominal pain, blood in stool, constipation, diarrhea, nausea and vomiting.  Genitourinary: Negative for difficulty urinating, discharge, flank pain, genital sores, hematuria and urgency.  Musculoskeletal: Negative for arthralgias, back pain, gait problem, joint swelling, myalgias and neck stiffness.  Skin: Negative for rash.  Neurological: Negative for dizziness, syncope, speech difficulty, weakness, numbness and headaches.  Hematological: Negative for adenopathy. Does not bruise/bleed easily.  Psychiatric/Behavioral:  Negative for behavioral problems and dysphoric mood. The patient is not nervous/anxious.        Objective:   Physical Exam  Constitutional: He appears well-developed and well-nourished.  Blood pressure 120/70  HENT:  Head: Normocephalic and atraumatic.  Right Ear: External ear normal.  Left Ear: External ear normal.  Nose: Nose normal.  Mouth/Throat: Oropharynx is clear and moist.  Eyes: Pupils are equal, round, and reactive to light. Conjunctivae and EOM are normal. No scleral icterus.  Neck: Normal range of motion. Neck supple. No JVD present. No thyromegaly present.  Cardiovascular: Regular rhythm, normal heart sounds and intact distal pulses. Exam reveals no gallop and no friction rub.  No murmur heard. Pulmonary/Chest: Effort normal and breath sounds normal. He exhibits no tenderness.  Abdominal: Soft. Bowel sounds are normal. He exhibits no distension and no mass. There is no tenderness.  Genitourinary: Penis normal.  Musculoskeletal: Normal range of motion. He exhibits no edema or tenderness.  Lymphadenopathy:    He has no cervical adenopathy.  Neurological: He is alert. He has normal reflexes. No cranial nerve deficit. Coordination normal.  Skin: Skin is warm and dry. No rash noted.  Psychiatric: He has a normal mood and affect. His behavior is normal.          Assessment & Plan:  Preventive health examination Essential hypertension.  Well-controlled Impaired glucose tolerance Coronary artery calcification Ongoing tobacco use.  Low-dose chest CT follow-up in November of this year  Total smoking cessation encouraged Review screening lab Follow-up 1 year  Marletta Lor

## 2018-03-03 ENCOUNTER — Ambulatory Visit (INDEPENDENT_AMBULATORY_CARE_PROVIDER_SITE_OTHER)
Admission: RE | Admit: 2018-03-03 | Discharge: 2018-03-03 | Disposition: A | Discharge status: Home / Self Care | Payer: Medicare Other | Source: Ambulatory Visit | Attending: Acute Care | Admitting: Acute Care

## 2018-03-03 DIAGNOSIS — F17210 Nicotine dependence, cigarettes, uncomplicated: Secondary | ICD-10-CM | POA: Diagnosis not present

## 2018-03-03 DIAGNOSIS — Z122 Encounter for screening for malignant neoplasm of respiratory organs: Secondary | ICD-10-CM

## 2018-03-07 ENCOUNTER — Telehealth: Payer: Self-pay | Admitting: Acute Care

## 2018-03-07 DIAGNOSIS — Z87891 Personal history of nicotine dependence: Secondary | ICD-10-CM

## 2018-03-07 DIAGNOSIS — Z122 Encounter for screening for malignant neoplasm of respiratory organs: Secondary | ICD-10-CM

## 2018-03-07 DIAGNOSIS — F17210 Nicotine dependence, cigarettes, uncomplicated: Principal | ICD-10-CM

## 2018-03-07 NOTE — Telephone Encounter (Signed)
LMTC x 1  

## 2018-03-07 NOTE — Telephone Encounter (Signed)
Pt informed of CT results per Eric Form, NP.  PT verbalized understanding.  Copy sent to PCP.  Order placed for 6 mth  f/u CT.

## 2018-08-07 ENCOUNTER — Ambulatory Visit (INDEPENDENT_AMBULATORY_CARE_PROVIDER_SITE_OTHER): Payer: Medicare Other | Admitting: Family Medicine

## 2018-08-07 ENCOUNTER — Other Ambulatory Visit: Payer: Self-pay

## 2018-08-07 ENCOUNTER — Encounter: Payer: Self-pay | Admitting: Family Medicine

## 2018-08-07 DIAGNOSIS — F17210 Nicotine dependence, cigarettes, uncomplicated: Secondary | ICD-10-CM

## 2018-08-07 DIAGNOSIS — E785 Hyperlipidemia, unspecified: Secondary | ICD-10-CM

## 2018-08-07 DIAGNOSIS — I10 Essential (primary) hypertension: Secondary | ICD-10-CM | POA: Diagnosis not present

## 2018-08-07 DIAGNOSIS — K219 Gastro-esophageal reflux disease without esophagitis: Secondary | ICD-10-CM

## 2018-08-07 NOTE — Progress Notes (Signed)
Virtual Visit via Video Note  I connected with Jesse [name] on 08/07/18 at 10:30 AM EDT by a video enabled telemedicine application and verified that I am speaking with the correct person using two identifiers.  Location patient: home Location provider:home office Persons participating in the virtual visit: patient, provider  I discussed the limitations of evaluation and management by telemedicine and the availability of in person appointments. The patient expressed understanding and agreed to proceed.   HPI: Pt seen for follow up on chronic conditions and TOC, previously seen by Dr. Burnice Logan  HLD:  On atorvastatin since 2001.  Taking 20 mg daily, breaks the 40 mg tab in half.  Denies myalgias.  GERD:  Taking omeprazole 40 mg daily.  Having no symptoms since being on meds.  Would get a dull pain in stomach prior to being on meds.  Would have   HTN:  Taking norvasc 5 mg daily.  Checking bp at home 120-125/75-80.  Pt walks and plays golf, gardening for exercising.  Drinking 4 glasses of water per day.  Eating veggies, chicken and pork.  Eating little to no red meat.  ROS: See pertinent positives and negatives per HPI.  Past Medical History:  Diagnosis Date  . Anemia   . Cancer [facility])    bladder cancer  . DIVERTICULOSIS, COLON 01/14/2007   Qualifier: Diagnosis of  By: Paulina Fusi RN, [name]   . GASTRITIS, CHRONIC 06/01/2004   Qualifier: Diagnosis of  By: Nolon Rod CMA (AAMA), Robin    . GERD 01/09/2007   Qualifier: Diagnosis of  By: Scherrie Gerlach    . HYPERLIPIDEMIA 01/09/2007   Qualifier: Diagnosis of  By: Scherrie Gerlach    . Hypertension   . NEOPLASM, MALIGNANT, BLADDER, HX OF 2002   Qualifier: Diagnosis of  By: Nolon Rod CMA (AAMA), Robin  / no chemo or radiation  . OSTEOARTHRITIS 01/14/2007   Qualifier: Diagnosis of  By: Paulina Fusi RN, [name]   . Reflux esophagitis 06/01/2004   Qualifier: Diagnosis of  By: Nolon Rod CMA (AAMA), Robin    . TOBACCO USER 02/16/2009   Qualifier: Diagnosis of  By: Burnice Logan  MD, Doretha Sou   . VITAMIN D DEFICIENCY 06/03/2007   Qualifier: Diagnosis of  By: Burnice Logan  MD, Doretha Sou     Past Surgical History:  Procedure Laterality Date  . COLONOSCOPY    . KNEE SURGERY  07/20/2008   bilat.  Marland Kitchen NASAL SEPTUM SURGERY  1995  . TRANSURETHRAL RESECTION OF BLADDER TUMOR  2002  . UPPER GASTROINTESTINAL ENDOSCOPY      Family History  Problem Relation Age of Onset  . Dementia Mother   . Diabetes Father   . Mental illness Father   . Peripheral vascular disease Brother     SOCIAL HX: Pt is married. From [place], [place] (in the [place], near [place]).  Moved to [place] in 1994.  1 pd every 2-3 days, was smoking 2 ppd.  Using nicotine gum.  Started at age 51.  Has a cigarette with his martini at night.   Current Outpatient Medications:  .  amLODipine (NORVASC) 5 MG tablet, Take 1 tablet (5 mg total) by mouth daily., Disp: 90 tablet, Rfl: 6 .  atorvastatin (LIPITOR) 40 MG tablet, TAKE 1 TABLET BY MOUTH  DAILY AT 6PM, Disp: 90 tablet, Rfl: 3 .  ibuprofen (ADVIL,MOTRIN) 200 MG tablet, Take 200 mg by mouth as needed., Disp: , Rfl:  .  omeprazole (PRILOSEC) 40 MG capsule, Take 1 capsule (  40 mg total) by mouth daily., Disp: 90 capsule, Rfl: 5 .  tadalafil (CIALIS) 20 MG tablet, Take 1 tablet (20 mg total) by mouth daily as needed for erectile dysfunction., Disp: 6 tablet, Rfl: 6  Current Facility-Administered Medications:  .  0.9 %  sodium chloride infusion, 500 mL, Intravenous, Continuous, Carlean Purl, Ofilia Neas, MD  EXAM:  VITALS per patient if applicable:  RR between 12-20 bpm.  GENERAL: alert, oriented, appears well and in no acute distress  HEENT: atraumatic, conjunctiva clear, no obvious abnormalities on inspection of external nose and ears  NECK: normal movements of the head and neck  LUNGS: on inspection no signs of respiratory distress, breathing rate appears normal, no obvious gross SOB, gasping or wheezing  CV: no obvious  cyanosis  MS: moves all visible extremities without noticeable abnormality  PSYCH/NEURO: pleasant and cooperative, no obvious depression or anxiety, speech and thought processing grossly intact  ASSESSMENT AND PLAN:  Discussed the following assessment and plan:  Essential hypertension -controlled -continue norvasc 5 mg -discussed lifestyle modifications  Gastroesophageal reflux disease, esophagitis presence not specified -continue omeprazole 40 mg daily -consider GI referral as has been unable to stop taking PPI without symptoms -limit NSAID use  Dyslipidemia -continue lipitor 20 mg daily (breaking 40 mg tabs in half) -discussed lifestyle modifications -will recheck lipid panel in the next few months at CPE.  Cigarette nicotine dependence without complication -smoking cessation counseling >3 min, <10 min -smoking 1pp q 2-3 days, down from 2 ppd -continue nicorette gum -discussed tips/tools to help pt quit  -will continue to evaluate at each OFV.   I discussed the assessment and treatment plan with the patient. The patient was provided an opportunity to ask questions and all were answered. The patient agreed with the plan and demonstrated an understanding of the instructions.   The patient was advised to call back or seek an in-person evaluation if the symptoms worsen or if the condition fails to improve as anticipated.  [name], MD

## 2018-08-29 ENCOUNTER — Telehealth: Payer: Self-pay | Admitting: *Deleted

## 2018-08-29 NOTE — Telephone Encounter (Signed)
COVID-19 Pre-Screening Questions:  In the past 7 to 10 days have you had a cough, shortness of breath, headache, congestion, fever, body aches, chills, sore throat, or sudden loss of taste or sense of smell?NO   Have you been around anyone with known Covid 19.NO   Have you been around anyone who is awaiting Covid 19 test results in the past 7 to 10 days? NO   Have you been around anyone who has been exposed to Covid 19, or has mentioned symptoms of Covid 19 within the past 7 to 10 days? NO  If you have any concerns about symptoms your patients report please contact your leadership team, or the provider the patient is seeing in the office for further guidance.

## 2018-09-01 ENCOUNTER — Other Ambulatory Visit: Payer: Self-pay

## 2018-09-01 ENCOUNTER — Ambulatory Visit (INDEPENDENT_AMBULATORY_CARE_PROVIDER_SITE_OTHER)
Admission: RE | Admit: 2018-09-01 | Discharge: 2018-09-01 | Disposition: A | Discharge status: Home / Self Care | Payer: Medicare Other | Source: Ambulatory Visit | Attending: Acute Care | Admitting: Acute Care

## 2018-09-01 DIAGNOSIS — F17210 Nicotine dependence, cigarettes, uncomplicated: Secondary | ICD-10-CM

## 2018-09-01 DIAGNOSIS — Z122 Encounter for screening for malignant neoplasm of respiratory organs: Secondary | ICD-10-CM

## 2018-09-01 DIAGNOSIS — Z87891 Personal history of nicotine dependence: Secondary | ICD-10-CM

## 2018-09-01 DIAGNOSIS — R918 Other nonspecific abnormal finding of lung field: Secondary | ICD-10-CM

## 2018-09-02 ENCOUNTER — Other Ambulatory Visit: Payer: Self-pay | Admitting: Acute Care

## 2018-09-02 DIAGNOSIS — F17210 Nicotine dependence, cigarettes, uncomplicated: Secondary | ICD-10-CM

## 2018-09-02 DIAGNOSIS — Z122 Encounter for screening for malignant neoplasm of respiratory organs: Secondary | ICD-10-CM

## 2018-09-02 DIAGNOSIS — Z87891 Personal history of nicotine dependence: Secondary | ICD-10-CM

## 2018-10-16 ENCOUNTER — Other Ambulatory Visit: Payer: Self-pay

## 2018-10-16 ENCOUNTER — Encounter: Payer: Medicare Other | Admitting: Family Medicine

## 2018-10-16 DIAGNOSIS — I10 Essential (primary) hypertension: Secondary | ICD-10-CM

## 2018-10-16 MED ORDER — AMLODIPINE BESYLATE 5 MG PO TABS: 5.0000 mg | ORAL_TABLET | Freq: Every day | ORAL | 0 refills | Status: DC

## 2018-10-22 ENCOUNTER — Ambulatory Visit (INDEPENDENT_AMBULATORY_CARE_PROVIDER_SITE_OTHER): Payer: Medicare Other | Admitting: Family Medicine

## 2018-10-22 ENCOUNTER — Encounter: Payer: Self-pay | Admitting: Family Medicine

## 2018-10-22 ENCOUNTER — Other Ambulatory Visit: Payer: Self-pay

## 2018-10-22 VITALS — BP 120/78 | HR 74 | Temp 98.1°F | Wt 179.0 lb

## 2018-10-22 DIAGNOSIS — K219 Gastro-esophageal reflux disease without esophagitis: Secondary | ICD-10-CM

## 2018-10-22 DIAGNOSIS — Z131 Encounter for screening for diabetes mellitus: Secondary | ICD-10-CM

## 2018-10-22 DIAGNOSIS — I10 Essential (primary) hypertension: Secondary | ICD-10-CM | POA: Diagnosis not present

## 2018-10-22 DIAGNOSIS — F17210 Nicotine dependence, cigarettes, uncomplicated: Secondary | ICD-10-CM

## 2018-10-22 DIAGNOSIS — H3530 Unspecified macular degeneration: Secondary | ICD-10-CM | POA: Diagnosis not present

## 2018-10-22 DIAGNOSIS — Z0000 Encounter for general adult medical examination without abnormal findings: Secondary | ICD-10-CM | POA: Diagnosis not present

## 2018-10-22 DIAGNOSIS — E785 Hyperlipidemia, unspecified: Secondary | ICD-10-CM | POA: Diagnosis not present

## 2018-10-22 DIAGNOSIS — Z125 Encounter for screening for malignant neoplasm of prostate: Secondary | ICD-10-CM | POA: Diagnosis not present

## 2018-10-22 LAB — LIPID PANEL
Cholesterol: 191 mg/dL (ref 0–200)
HDL: 42.5 mg/dL (ref >39.00)
NonHDL: 148.3
Total CHOL/HDL Ratio: 4
Triglycerides: 211 mg/dL — ABNORMAL HIGH (ref 0.0–149.0)
VLDL: 42.2 mg/dL — ABNORMAL HIGH (ref 0.0–40.0)

## 2018-10-22 LAB — BASIC METABOLIC PANEL
BUN: 21 mg/dL (ref 6–23)
CO2: 27 mEq/L (ref 19–32)
Calcium: 9.4 mg/dL (ref 8.4–10.5)
Chloride: 104 mEq/L (ref 96–112)
Creatinine, Ser: 0.72 mg/dL (ref 0.40–1.50)
GFR: 108.16 mL/min (ref >60.00)
Glucose, Bld: 114 mg/dL — ABNORMAL HIGH (ref 70–99)
Potassium: 5.1 mEq/L (ref 3.5–5.1)
Sodium: 141 mEq/L (ref 135–145)

## 2018-10-22 LAB — CBC WITH DIFFERENTIAL/PLATELET
Basophils Absolute: 0 10*3/uL (ref 0.0–0.1)
Basophils Relative: 0.4 % (ref 0.0–3.0)
Eosinophils Absolute: 0.1 10*3/uL (ref 0.0–0.7)
Eosinophils Relative: 2.1 % (ref 0.0–5.0)
HCT: 42.7 % (ref 39.0–52.0)
Hemoglobin: 14.6 g/dL (ref 13.0–17.0)
Lymphocytes Relative: 27.9 % (ref 12.0–46.0)
Lymphs Abs: 1.5 10*3/uL (ref 0.7–4.0)
MCHC: 34.3 g/dL (ref 30.0–36.0)
MCV: 98 fl (ref 78.0–100.0)
Monocytes Absolute: 0.3 10*3/uL (ref 0.1–1.0)
Monocytes Relative: 6.2 % (ref 3.0–12.0)
Neutro Abs: 3.3 10*3/uL (ref 1.4–7.7)
Neutrophils Relative %: 63.4 % (ref 43.0–77.0)
Platelets: 127 10*3/uL — ABNORMAL LOW (ref 150.0–400.0)
RBC: 4.35 Mil/uL (ref 4.22–5.81)
RDW: 13.8 % (ref 11.5–15.5)
WBC: 5.3 10*3/uL (ref 4.0–10.5)

## 2018-10-22 LAB — LDL CHOLESTEROL, DIRECT: Direct LDL: 102 mg/dL

## 2018-10-22 LAB — PSA: PSA: 1.43 ng/mL (ref 0.10–4.00)

## 2018-10-22 LAB — HEMOGLOBIN A1C: Hgb A1c MFr Bld: 5.8 % (ref 4.6–6.5)

## 2018-10-22 NOTE — Patient Instructions (Signed)
Preventive Care 69 Years and Older, Male Preventive care refers to lifestyle choices and visits with your health care provider that can promote health and wellness. This includes:  A yearly physical exam. This is also called an annual well check.  Regular dental and eye exams.  Immunizations.  Screening for certain conditions.  Healthy lifestyle choices, such as diet and exercise. What can I expect for my preventive care visit? Physical exam Your health care provider will check:  Height and weight. These may be used to calculate body mass index (BMI), which is a measurement that tells if you are at a healthy weight.  Heart rate and blood pressure.  Your skin for abnormal spots. Counseling Your health care provider may ask you questions about:  Alcohol, tobacco, and drug use.  Emotional well-being.  Home and relationship well-being.  Sexual activity.  Eating habits.  History of falls.  Memory and ability to understand (cognition).  Work and work Statistician. What immunizations do I need?  Influenza (flu) vaccine  This is recommended every year. Tetanus, diphtheria, and pertussis (Tdap) vaccine  You may need a Td booster every 10 years. Varicella (chickenpox) vaccine  You may need this vaccine if you have not already been vaccinated. Zoster (shingles) vaccine  You may need this after age 50. Pneumococcal conjugate (PCV13) vaccine  One dose is recommended after age 24. Pneumococcal polysaccharide (PPSV23) vaccine  One dose is recommended after age 33. Measles, mumps, and rubella (MMR) vaccine  You may need at least one dose of MMR if you were born in 1957 or later. You may also need a second dose. Meningococcal conjugate (MenACWY) vaccine  You may need this if you have certain conditions. Hepatitis A vaccine  You may need this if you have certain conditions or if you travel or work in places where you may be exposed to hepatitis A. Hepatitis B vaccine   You may need this if you have certain conditions or if you travel or work in places where you may be exposed to hepatitis B. Haemophilus influenzae type b (Hib) vaccine  You may need this if you have certain conditions. You may receive vaccines as individual doses or as more than one vaccine together in one shot (combination vaccines). Talk with your health care provider about the risks and benefits of combination vaccines. What tests do I need? Blood tests  Lipid and cholesterol levels. These may be checked every 5 years, or more frequently depending on your overall health.  Hepatitis C test.  Hepatitis B test. Screening  Lung cancer screening. You may have this screening every year starting at age 74 if you have a 30-pack-year history of smoking and currently smoke or have quit within the past 15 years.  Colorectal cancer screening. All adults should have this screening starting at age 57 and continuing until age 54. Your health care provider may recommend screening at age 47 if you are at increased risk. You will have tests every 1-10 years, depending on your results and the type of screening test.  Prostate cancer screening. Recommendations will vary depending on your family history and other risks.  Diabetes screening. This is done by checking your blood sugar (glucose) after you have not eaten for a while (fasting). You may have this done every 1-3 years.  Abdominal aortic aneurysm (AAA) screening. You may need this if you are a current or former smoker.  Sexually transmitted disease (STD) testing. Follow these instructions at home: Eating and drinking  Eat  a diet that includes fresh fruits and vegetables, whole grains, lean protein, and low-fat dairy products. Limit your intake of foods with high amounts of sugar, saturated fats, and salt.  Take vitamin and mineral supplements as recommended by your health care provider.  Do not drink alcohol if your health care provider  tells you not to drink.  If you drink alcohol: ? Limit how much you have to 0-2 drinks a day. ? Be aware of how much alcohol is in your drink. In the [place] one drink equals one 12 oz bottle of beer (355 mL), one 5 oz glass of wine (148 mL), or one 1 oz glass of hard liquor (44 mL). Lifestyle  Take daily care of your teeth and gums.  Stay active. Exercise for at least 30 minutes on 5 or more days each week.  Do not use any products that contain nicotine or tobacco, such as cigarettes, e-cigarettes, and chewing tobacco. If you need help quitting, ask your health care provider.  If you are sexually active, practice safe sex. Use a condom or other form of protection to prevent STIs (sexually transmitted infections).  Talk with your health care provider about taking a low-dose aspirin or statin. What's next?  Visit your health care provider once a year for a well check visit.  Ask your health care provider how often you should have your eyes and teeth checked.  Stay up to date on all vaccines. This information is not intended to replace advice given to you by your health care provider. Make sure you discuss any questions you have with your health care provider. Document Released: 04/29/2015 Document Revised: 03/27/2018 Document Reviewed: 03/27/2018 Elsevier Patient Education  2020 [place] Screening A lung cancer screening is a test that checks for lung cancer. Lung cancer screening is done to look for lung cancer in its very early stages, before it spreads and becomes harder to treat and before symptoms appear. Finding cancer early improves the chances of successful treatment. It may save your life. Should I be screened for lung cancer? You should be screened for lung cancer if all of these apply:  You currently smoke or you have quit smoking within the past 15 years.  You are 24-57 years old. Screening may be recommended up to age 44 depending on your overall health  and other factors.  You are in good general health.  You have a smoking history of 1 pack a day for 30 years or 2 packs a day for 15 years. Screening may also be recommended if you are at high risk for the disease. You may be at high risk if:  You have a family history of lung cancer.  You have been exposed to asbestos.  You have chronic obstructive pulmonary disease (COPD).  You have a history of previous lung cancer. How often should I be screened for lung cancer?  If you are at risk for lung cancer, it is recommended that you are screened once a year. The recommended screening test is a low-dose CT scan. How can I lower my risk of lung cancer? To lower your risk of developing lung cancer:  If you smoke, stop smoking all tobacco products.  Avoid secondhand smoke.  Avoid exposure to radiation.  Avoid exposure to radon gas. Have your home checked for radon regularly.  Avoid things that cause cancer (carcinogens).  Avoid living or working in places with high air pollution. Where to find more information Ask your  health care provider about the risks and benefits of screening. More information and resources are available from these organizations:  [place] (ACS): www.cancer.org  American Lung Association: www.lung.org Contact a health care provider if:  You start to show symptoms of lung cancer, including: ? Coughing that will not go away. ? Wheezing. ? Chest pain. ? Coughing up blood. ? Shortness of breath. ? Weight loss that cannot be explained. ? Constant fatigue. Summary  Lung cancer screening may find lung cancer before symptoms appear. Finding cancer early improves the chances of successful treatment. It may save your life.  If you are at risk for lung cancer, it is recommended that you are screened once a year. The recommended screening test is a low-dose CT scan.  You can make lifestyle changes to lower your risk of lung cancer.  Ask your  health care provider about the risks and benefits of screening. This information is not intended to replace advice given to you by your health care provider. Make sure you discuss any questions you have with your health care provider. Document Released: 02/22/2016 Document Revised: 07/25/2018 Document Reviewed: 02/22/2016 Elsevier Patient Education  [place] Degeneration  Age-related macular degeneration (AMD) is an eye disease related to aging. The disease causes a loss of central vision. Central vision allows a person to see objects clearly and do daily tasks like reading and driving. There are two main types of AMD:  Dry AMD. People with this type generally lose their vision slowly. This is the most common type of AMD. Some people with dry AMD notice very little change in their vision as they age.  Wet AMD. People with this type can lose their vision quickly. What are the causes? This condition is caused by damage to the part of the eye that provides you with central vision (macula).  Dry AMD happens when deposits in the macula cause light-sensitive cells to slowly break down.  Wet AMD happens when abnormal blood vessels grow under the macula and leak blood and fluid. What increases the risk? You are more likely to develop this condition if you:  Are 65 years old or older, and especially 57 years old or older.  Smoke.  Are obese.  Have a family history of AMD.  Have high cholesterol, high blood pressure, or heart disease.  Have been exposed to high levels of ultraviolet (UV) light and blue light.  Are white (Caucasian).  Are male. What are the signs or symptoms? Common symptoms of this condition include:  Blurred vision, especially when reading print material. The blurred vision often improves in brighter light.  A blurred or blind spot in the center of your field of vision that is small but growing larger.  Bright colors seeming less  bright than they used to be.  Decreased ability to recognize and see faces.  One eye seeing worse than the other.  Decreased ability to adapt to dimly lit rooms.  Straight lines appearing crooked or wavy. How is this diagnosed? This condition is diagnosed based on your symptoms and an eye exam. During the eye exam:  Eye drops will be placed into your eyes to enlarge (dilate) your pupils. This will allow your health care provider to see the back of your eye.  You may be asked to look at an image that looks like a checkerboard ([place]ler grid). Early changes in your central vision may cause the grid to appear distorted. After the exam, you may be  given one or both of these tests:  Fluorescein angiogram. This test determines whether you have dry or wet AMD.  Optical coherence tomography (OCT) test to evaluate deep layers of the retina. How is this treated? There is no cure for this condition, but treatment can help to slow down progression of the disease. This condition may be treated with:  Supplements, including vitamin C, vitamin E, beta carotene, and zinc.  Laser surgery to destroy new blood vessels or leaking blood vessels in your eye.  Injections of medicines into your eye to slow down the formation of abnormal blood vessels that may leak. These injections may need to be repeated on a routine basis. Follow these instructions at home:  Take over-the-counter and prescription medicines only as told by your health care provider.  Take vitamins and supplements as told by your health care provider.  Ask your health care provider for an Amsler grid. Use it every day to check each eye for vision changes.  Get an eye exam as often as told by your health care provider. Make sure to get an eye exam at least once every year.  Keep all follow-up visits as told by your health care provider. This is important. Contact a health care provider if:  You notice any new changes in your vision.  Get help right away if:  You suddenly lose vision or develop pain in the eye. Summary  Age-related macular degeneration (AMD) is an eye disease related to aging. There are two types of this condition: dry AMD and wet AMD.  This condition is caused by damage to the part of the eye that provides you with central vision (macula).  Once diagnosed with AMD, make sure to get an eye exam every year, take supplements and vitamins as directed, use an Amsler grid at home, and follow up with your health care provider. This information is not intended to replace advice given to you by your health care provider. Make sure you discuss any questions you have with your health care provider. Document Released: 07/10/2007 Document Revised: 10/09/2017 Document Reviewed: 10/09/2017 Elsevier Patient Education  2020 [employer].

## 2018-10-22 NOTE — Progress Notes (Signed)
Subjective:     Jesse [name] is a 68 y.o. male and is here for a comprehensive physical exam. The patient reports no problems. Pt still smoking cigarettes, 1 pack every 2 to 3 days.  Patient had yearly low-dose CT done in May.  Patient also seen Dr. Baird Cancer at [facility] retina specialist for right eye macular degeneration.  Receiving injections.  Labs in the pool and gardening for exercise.  Patient notes increased earwax from being in the pool.  Patient with history of bladder cancer doing well.  States has not had recent follow-up with urology.  Denies urinary symptoms.  History of GERD taking Prilosec 40 mg daily any issues.  Social History   Socioeconomic History  . Marital status: Married    Spouse name: Not on file  . Number of children: Not on file  . Years of education: Not on file  . Highest education level: Not on file  Occupational History  . Not on file  Social Needs  . Financial resource strain: Not on file  . Food insecurity    Worry: Not on file    Inability: Not on file  . Transportation needs    Medical: Not on file    Non-medical: Not on file  Tobacco Use  . Smoking status: Current Every Day Smoker    Years: 40.00    Types: Cigarettes  . Smokeless tobacco: Never Used  . Tobacco comment: 8 -10 cigarettes daily  Substance and Sexual Activity  . Alcohol use: Yes    Alcohol/week: 14.0 standard drinks    Types: 14 Standard drinks or equivalent per week  . Drug use: Yes    Types: Marijuana    Comment: Smokes marijuana occasional  . Sexual activity: Not on file  Lifestyle  . Physical activity    Days per week: Not on file    Minutes per session: Not on file  . Stress: Not on file  Relationships  . Social Herbalist on phone: Not on file    Gets together: Not on file    Attends religious service: Not on file    Active member of club or organization: Not on file    Attends meetings of clubs or organizations: Not on file    Relationship status:  Not on file  . Intimate partner violence    Fear of current or ex partner: Not on file    Emotionally abused: Not on file    Physically abused: Not on file    Forced sexual activity: Not on file  Other Topics Concern  . Not on file  Social History Narrative  . Not on file   Health Maintenance  Topic Date Due  . INFLUENZA VACCINE  11/15/2018  . TETANUS/TDAP  10/08/2022  . Hepatitis C Screening  Completed  . PNA vac Low Risk Adult  Completed    The following portions of the patient's history were reviewed and updated as appropriate: allergies, current medications, past family history, past medical history, past social history, past surgical history and problem list.  Review of Systems Pertinent items noted in HPI and remainder of comprehensive ROS otherwise negative.   Objective:    BP 120/78 (BP Location: Left Arm, Patient Position: Sitting, Cuff Size: Large)   Pulse 74   Temp 98.1 F (36.7 C) (Oral)   Wt 179 lb (81.2 kg)   SpO2 98%   BMI 26.05 kg/m  General appearance: alert and cooperative.  In NAD. Head: Normocephalic, without  obvious abnormality, atraumatic Eyes: conjunctivae/corneas clear. PERRL, EOM's intact. Fundi benign. Ears: normal TM's and external ear canals both ears cerumen present in bilateral canals, nonobstructive. Nose: Nares normal. Septum midline. Mucosa normal. No drainage or sinus tenderness. Throat: lips, mucosa, and tongue normal; teeth and gums normal Neck: no adenopathy, no carotid bruit, no JVD, supple, symmetrical, trachea midline and thyroid not enlarged, symmetric, no tenderness/mass/nodules Lungs: clear to auscultation bilaterally Heart: regular rate and rhythm, S1, S2 normal, no murmur, click, rub or gallop Abdomen: soft, non-tender; bowel sounds normal; no masses,  no organomegaly Extremities: extremities normal, atraumatic, no cyanosis or edema Pulses: 2+ and symmetric Skin: Skin color, texture, turgor normal. No rashes or lesions Lymph  nodes: Cervical, supraclavicular, and axillary nodes normal. Neurologic: Alert and oriented X 3, normal strength and tone. Normal symmetric reflexes. Normal coordination and gait    Assessment:    Healthy male exam.      Plan:     Anticipatory guidance given including wearing seatbelts, smoke detectors in the home, increasing physical activity, increasing p.o. intake of water and vegetables. -will obtain labs -Low dose CT chest done 09/01/2018 -given handout See After Visit Summary for Counseling Recommendations    HTN -controlled -continue norvasc 5 mg -will obtain BMP  Dyslipidemia -Continue Lipitor 40 mg -We will obtain lipid panel  Screen for diabetes  Screen for prostate cancer -Obtain PSA follow-up PRN  Nicotine dependence -Smoking cessation greater than 3 minutes, less than 10 minutes -smoking 1 p q 2-3 days -We will readdress at each visit  Macular degeneration -Right eye -Continue following with Dr. Baird Cancer at [facility] retina specialist for injections  GERD -Continue Prilosec 40 mg daily  Grier Mitts, MD

## 2018-10-24 ENCOUNTER — Other Ambulatory Visit: Payer: Self-pay | Admitting: Family Medicine

## 2018-10-24 DIAGNOSIS — D696 Thrombocytopenia, unspecified: Secondary | ICD-10-CM

## 2018-12-10 ENCOUNTER — Telehealth: Payer: Self-pay | Admitting: Family Medicine

## 2018-12-10 DIAGNOSIS — I10 Essential (primary) hypertension: Secondary | ICD-10-CM

## 2018-12-24 ENCOUNTER — Other Ambulatory Visit: Payer: Self-pay | Admitting: Family Medicine

## 2018-12-24 DIAGNOSIS — I10 Essential (primary) hypertension: Secondary | ICD-10-CM

## 2018-12-24 NOTE — Telephone Encounter (Signed)
10 minutes ago (10:59 AM)    Tovar-Garcia, Alice, NT 10 minutes ago (10:59 AM)     Medication Refill - Medication:  omeprazole (PRILOSEC) 40 MG capsule atorvastatin (LIPITOR) 40 MG tablet  amLODipine (NORVASC) 5 MG tablet  Has the patient contacted their pharmacy? Yes advised to call office. Pharmacy called and sent requests.  Preferred Pharmacy (with phone number or street name):  [id number] - [place], [place] - [address] [phone] (Phone) [phone] (Fax)   Agent: Please be advised that RX refills may take up to 3 business days. We ask that you follow-up with your pharmacy.

## 2018-12-24 NOTE — Telephone Encounter (Signed)
Medication Refill - Medication:  omeprazole (PRILOSEC) 40 MG capsule atorvastatin (LIPITOR) 40 MG tablet  amLODipine (NORVASC) 5 MG tablet  Has the patient contacted their pharmacy? Yes advised to call office. Pharmacy called and sent requests.  Preferred Pharmacy (with phone number or street name):  [id number] - [place], [place] - [address] [phone] (Phone) [phone] (Fax)   Agent: Please be advised that RX refills may take up to 3 business days. We ask that you follow-up with your pharmacy.

## 2018-12-24 NOTE — Telephone Encounter (Signed)
See telephone encounter for 12/24/18

## 2018-12-24 NOTE — Telephone Encounter (Signed)
Requested medication (s) are due for refill today: yes  Requested medication (s) are on the active medication list: yes    Future visit scheduled:  yes  Notes to clinic:  Ordering provider different than pcp   Requested Prescriptions  Pending Prescriptions Disp Refills   omeprazole (PRILOSEC) 40 MG capsule 90 capsule 5    Sig: Take 1 capsule (40 mg total) by mouth daily.     Gastroenterology: Proton Pump Inhibitors Passed - 12/24/2018 11:20 AM      Passed - Valid encounter within last 12 months    Recent Outpatient Visits          2 months ago Well adult exam   Therapist, music at [facility], Langley Adie, MD   4 months ago Essential hypertension   Therapist, music at [facility], Langley Adie, MD   1 year ago Encounter for preventive health examination   Therapist, music at [place], Doretha Sou, MD   2 years ago Encounter for preventive health examination   Therapist, music at [facility], Doretha Sou, MD   2 years ago Hypertension, essential   Therapist, music at [facility], [place], [place]      Future Appointments            In 10 months Volanda Napoleon, Langley Adie, MD [facility] at [place], [facility]            amLODipine (NORVASC) 5 MG tablet 90 tablet 0    Sig: Take 1 tablet (5 mg total) by mouth daily.     Cardiovascular:  Calcium Channel Blockers Passed - 12/24/2018 11:20 AM      Passed - Last BP in normal range    BP Readings from Last 1 Encounters:  10/22/18 120/78         Passed - Valid encounter within last 6 months    Recent Outpatient Visits          2 months ago Well adult exam   Therapist, music at [facility], Langley Adie, MD   4 months ago Essential hypertension   Therapist, music at [facility], Langley Adie, MD   1 year ago Encounter for preventive health examination   Therapist, music at [place], Doretha Sou, MD   2 years ago Encounter for preventive health examination   Forensic psychologist at [facility], Doretha Sou, MD   2 years ago Hypertension, essential   Therapist, music at [facility], [place], [place]      Future Appointments            In 10 months Volanda Napoleon, Langley Adie, MD [facility] at [place], [facility]            atorvastatin (LIPITOR) 40 MG tablet 90 tablet 3    Sig: TAKE 1 TABLET BY MOUTH  DAILY AT 6PM     Cardiovascular:  Antilipid - Statins Failed - 12/24/2018 11:20 AM      Failed - LDL in normal range and within 360 days    No results found for: LDLCALC, LDLC, HIRISKLDL       Failed - Triglycerides in normal range and within 360 days    Triglycerides  Date Value Ref Range Status  10/22/2018 211.0 (H) 0.0 - 149.0 mg/dL Final    Comment:    Normal:  <150 mg/dLBorderline High:  150 - 199 mg/dL         Passed - Total Cholesterol in normal range and within 360 days    Cholesterol  Date Value  Ref Range Status  10/22/2018 191 0 - 200 mg/dL Final    Comment:    ATP III Classification       Desirable:  < 200 mg/dL               Borderline High:  200 - 239 mg/dL          High:  > = 240 mg/dL         Passed - HDL in normal range and within 360 days    HDL  Date Value Ref Range Status  10/22/2018 42.50 >39.00 mg/dL Final         Passed - Patient is not pregnant      Passed - Valid encounter within last 12 months    Recent Outpatient Visits          2 months ago Well adult exam   Therapist, music at [place], MD   4 months ago Essential hypertension   Therapist, music at [facility], Langley Adie, MD   1 year ago Encounter for preventive health examination   Therapist, music at [facility], Doretha Sou, MD   2 years ago Encounter for preventive health examination   Therapist, music at [facility], Doretha Sou, MD   2 years ago Hypertension, essential   Therapist, music at [facility], [place], [place]      Future Appointments            In 10 months Volanda Napoleon, Langley Adie, MD [facility] at [place], [facility]

## 2018-12-25 NOTE — Telephone Encounter (Signed)
Pt calling back to check status.

## 2018-12-26 MED ORDER — AMLODIPINE BESYLATE 5 MG PO TABS: 5.0000 mg | ORAL_TABLET | Freq: Every day | ORAL | 0 refills | Status: DC

## 2018-12-26 MED ORDER — OMEPRAZOLE 40 MG PO CPDR: 40.0000 mg | DELAYED_RELEASE_CAPSULE | Freq: Every day | ORAL | 5 refills | Status: DC

## 2018-12-26 MED ORDER — ATORVASTATIN CALCIUM 40 MG PO TABS: ORAL_TABLET | ORAL | 3 refills | Status: DC

## 2019-03-24 DIAGNOSIS — H43811 Vitreous degeneration, right eye: Secondary | ICD-10-CM | POA: Diagnosis not present

## 2019-03-24 DIAGNOSIS — H353123 Nonexudative age-related macular degeneration, left eye, advanced atrophic without subfoveal involvement: Secondary | ICD-10-CM | POA: Diagnosis not present

## 2019-03-24 DIAGNOSIS — H353211 Exudative age-related macular degeneration, right eye, with active choroidal neovascularization: Secondary | ICD-10-CM | POA: Diagnosis not present

## 2019-03-31 ENCOUNTER — Other Ambulatory Visit: Payer: Self-pay | Admitting: Family Medicine

## 2019-03-31 DIAGNOSIS — I10 Essential (primary) hypertension: Secondary | ICD-10-CM

## 2019-04-01 ENCOUNTER — Other Ambulatory Visit: Payer: Self-pay | Admitting: Family Medicine

## 2019-04-01 ENCOUNTER — Encounter: Payer: Self-pay | Admitting: Family Medicine

## 2019-04-01 DIAGNOSIS — I10 Essential (primary) hypertension: Secondary | ICD-10-CM

## 2019-04-02 ENCOUNTER — Other Ambulatory Visit: Payer: Self-pay

## 2019-04-02 DIAGNOSIS — I10 Essential (primary) hypertension: Secondary | ICD-10-CM

## 2019-04-02 MED ORDER — AMLODIPINE BESYLATE 5 MG PO TABS: 5.0000 mg | ORAL_TABLET | Freq: Every day | ORAL | 0 refills | Status: DC

## 2019-04-03 ENCOUNTER — Other Ambulatory Visit: Payer: Self-pay

## 2019-04-03 DIAGNOSIS — I10 Essential (primary) hypertension: Secondary | ICD-10-CM

## 2019-04-03 MED ORDER — AMLODIPINE BESYLATE 5 MG PO TABS: 5.0000 mg | ORAL_TABLET | Freq: Every day | ORAL | 0 refills | Status: DC

## 2019-06-03 ENCOUNTER — Ambulatory Visit (INDEPENDENT_AMBULATORY_CARE_PROVIDER_SITE_OTHER): Payer: Medicare Other

## 2019-06-03 VITALS — BP 125/80 | Ht 69.5 in | Wt 175.0 lb

## 2019-06-03 DIAGNOSIS — Z0000 Encounter for general adult medical examination without abnormal findings: Secondary | ICD-10-CM | POA: Diagnosis not present

## 2019-06-03 NOTE — Patient Instructions (Addendum)
Mr. Schlink , Thank you for taking time to participate your Medicare Wellness Visit. I appreciate your ongoing commitment to your health goals. Please review the following plan we discussed and let me know if I can assist you in the future.   Screening recommendations/referrals: Colorectal Screening: colonoscopy completed 12/11/2016; per Dr Carlean Purl, no more colonoscopies are needed.   Vision and Dental Exams: Recommended annual ophthalmology exams for early detection of glaucoma and other disorders of the eye Recommended annual dental exams for proper oral hygiene  Diabetic Exams: Diabetic Eye Exam: N/A Diabetic Foot Exam: N/A  Vaccinations: Influenza vaccine: completed 01/16/2019; due again in fall 2021. Pneumococcal vaccine: completed 06/08/2015 & 09/28/2016. Up to date.  Tdap vaccine: completed 10/07/2012; due 10/08/2022.  Shingles vaccine: Please call your insurance company or pharmacy to determine your out of pocket expense for the Shingrix vaccine. You may receive this vaccine at your local pharmacy.  Advanced directives: Advance directives discussed with you today. Please bring a copy of your POA (Power of [place]) and/or Living Will to your next appointment.  Goals: continue  to drink at least 6-8 8oz glasses of water per day.  continue to exercise for at least 150 minutes per week.  Recommend to remove any items from the home that may cause slips or trips.  Recommend to continue efforts to reduce smoking habits until no longer smoking. Smoking Cessation literature is attached below.  Next appointment: Please schedule your Annual Wellness Visit with your Nurse Health Advisor in one year.  Preventive Care 98 Years and Older, Male Preventive care refers to lifestyle choices and visits with your health care provider that can promote health and wellness. What does preventive care include?  A yearly physical exam. This is also called an annual well check.  Dental exams once  or twice a year.  Routine eye exams. Ask your health care provider how often you should have your eyes checked.  Personal lifestyle choices, including:  Daily care of your teeth and gums.  Regular physical activity.  Eating a healthy diet.  Avoiding tobacco and drug use.  Limiting alcohol use.  Practicing safe sex.  Taking low doses of aspirin every day if recommended by your health care provider..  Taking vitamin and mineral supplements as recommended by your health care provider. What happens during an annual well check? The services and screenings done by your health care provider during your annual well check will depend on your age, overall health, lifestyle risk factors, and family history of disease. Counseling  Your health care provider may ask you questions about your:  Alcohol use.  Tobacco use.  Drug use.  Emotional well-being.  Home and relationship well-being.  Sexual activity.  Eating habits.  History of falls.  Memory and ability to understand (cognition).  Work and work Statistician. Screening  You may have the following tests or measurements:  Height, weight, and BMI.  Blood pressure.  Lipid and cholesterol levels. These may be checked every 5 years, or more frequently if you are over 67 years old.  Skin check.  Lung cancer screening. You may have this screening every year starting at age 12 if you have a 30-pack-year history of smoking and currently smoke or have quit within the past 15 years.  Fecal occult blood test (FOBT) of the stool. You may have this test every year starting at age 103.  Flexible sigmoidoscopy or colonoscopy. You may have a sigmoidoscopy every 5 years or a colonoscopy every 10 years starting at  age 53.  Prostate cancer screening. Recommendations will vary depending on your family history and other risks.  Hepatitis C blood test.  Hepatitis B blood test.  Sexually transmitted disease (STD) testing.  Diabetes  screening. This is done by checking your blood sugar (glucose) after you have not eaten for a while (fasting). You may have this done every 1-3 years.  Abdominal aortic aneurysm (AAA) screening. You may need this if you are a current or former smoker.  Osteoporosis. You may be screened starting at age 63 if you are at high risk. Talk with your health care provider about your test results, treatment options, and if necessary, the need for more tests. Vaccines  Your health care provider may recommend certain vaccines, such as:  Influenza vaccine. This is recommended every year.  Tetanus, diphtheria, and acellular pertussis (Tdap, Td) vaccine. You may need a Td booster every 10 years.  Zoster vaccine. You may need this after age 47.  Pneumococcal 13-valent conjugate (PCV13) vaccine. One dose is recommended after age 68.  Pneumococcal polysaccharide (PPSV23) vaccine. One dose is recommended after age 37. Talk to your health care provider about which screenings and vaccines you need and how often you need them. This information is not intended to replace advice given to you by your health care provider. Make sure you discuss any questions you have with your health care provider. Document Released: 04/29/2015 Document Revised: 12/21/2015 Document Reviewed: 02/01/2015 Elsevier Interactive Patient Education  2017 [place] Prevention in the Home Falls can cause injuries. They can happen to people of all ages. There are many things you can do to make your home safe and to help prevent falls. What can I do on the outside of my home?  Regularly fix the edges of walkways and driveways and fix any cracks.  Remove anything that might make you trip as you walk through a door, such as a raised step or threshold.  Trim any bushes or trees on the path to your home.  Use bright outdoor lighting.  Clear any walking paths of anything that might make someone trip, such as rocks or  tools.  Regularly check to see if handrails are loose or broken. Make sure that both sides of any steps have handrails.  Any raised decks and porches should have guardrails on the edges.  Have any leaves, snow, or ice cleared regularly.  Use sand or salt on walking paths during winter.  Clean up any spills in your garage right away. This includes oil or grease spills. What can I do in the bathroom?  Use night lights.  Install grab bars by the toilet and in the tub and shower. Do not use towel bars as grab bars.  Use non-skid mats or decals in the tub or shower.  If you need to sit down in the shower, use a plastic, non-slip stool.  Keep the floor dry. Clean up any water that spills on the floor as soon as it happens.  Remove soap buildup in the tub or shower regularly.  Attach bath mats securely with double-sided non-slip rug tape.  Do not have throw rugs and other things on the floor that can make you trip. What can I do in the bedroom?  Use night lights.  Make sure that you have a light by your bed that is easy to reach.  Do not use any sheets or blankets that are too big for your bed. They should not hang down onto the  floor.  Have a firm chair that has side arms. You can use this for support while you get dressed.  Do not have throw rugs and other things on the floor that can make you trip. What can I do in the kitchen?  Clean up any spills right away.  Avoid walking on wet floors.  Keep items that you use a lot in easy-to-reach places.  If you need to reach something above you, use a strong step stool that has a grab bar.  Keep electrical cords out of the way.  Do not use floor polish or wax that makes floors slippery. If you must use wax, use non-skid floor wax.  Do not have throw rugs and other things on the floor that can make you trip. What can I do with my stairs?  Do not leave any items on the stairs.  Make sure that there are handrails on both  sides of the stairs and use them. Fix handrails that are broken or loose. Make sure that handrails are as long as the stairways.  Check any carpeting to make sure that it is firmly attached to the stairs. Fix any carpet that is loose or worn.  Avoid having throw rugs at the top or bottom of the stairs. If you do have throw rugs, attach them to the floor with carpet tape.  Make sure that you have a light switch at the top of the stairs and the bottom of the stairs. If you do not have them, ask someone to add them for you. What else can I do to help prevent falls?  Wear shoes that:  Do not have high heels.  Have rubber bottoms.  Are comfortable and fit you well.  Are closed at the toe. Do not wear sandals.  If you use a stepladder:  Make sure that it is fully opened. Do not climb a closed stepladder.  Make sure that both sides of the stepladder are locked into place.  Ask someone to hold it for you, if possible.  Clearly mark and make sure that you can see:  Any grab bars or handrails.  First and last steps.  Where the edge of each step is.  Use tools that help you move around (mobility aids) if they are needed. These include:  Canes.  Walkers.  Scooters.  Crutches.  Turn on the lights when you go into a dark area. Replace any light bulbs as soon as they burn out.  Set up your furniture so you have a clear path. Avoid moving your furniture around.  If any of your floors are uneven, fix them.  If there are any pets around you, be aware of where they are.  Review your medicines with your doctor. Some medicines can make you feel dizzy. This can increase your chance of falling. Ask your doctor what other things that you can do to help prevent falls. This information is not intended to replace advice given to you by your health care provider. Make sure you discuss any questions you have with your health care provider. Document Released: 01/27/2009 Document Revised:  09/08/2015 Document Reviewed: 05/07/2014 Elsevier Interactive Patient Education  2017 [employer].  Steps to Quit Smoking Smoking tobacco is the leading cause of preventable death. It can affect almost every organ in the body. Smoking puts you and people around you at risk for many serious, long-lasting (chronic) diseases. Quitting smoking can be hard, but it is one of the best things that you  can do for your health. It is never too late to quit. How do I get ready to quit? When you decide to quit smoking, make a plan to help you succeed. Before you quit:  Pick a date to quit. Set a date within the next 2 weeks to give you time to prepare.  Write down the reasons why you are quitting. Keep this list in places where you will see it often.  Tell your family, friends, and co-workers that you are quitting. Their support is important.  Talk with your doctor about the choices that may help you quit.  Find out if your health insurance will pay for these treatments.  Know the people, places, things, and activities that make you want to smoke (triggers). Avoid them. What first steps can I take to quit smoking?  Throw away all cigarettes at home, at work, and in your car.  Throw away the things that you use when you smoke, such as ashtrays and lighters.  Clean your car. Make sure to empty the ashtray.  Clean your home, including curtains and carpets. What can I do to help me quit smoking? Talk with your doctor about taking medicines and seeing a counselor at the same time. You are more likely to succeed when you do both.  If you are pregnant or breastfeeding, talk with your doctor about counseling or other ways to quit smoking. Do not take medicine to help you quit smoking unless your doctor tells you to do so. To quit smoking: Quit right away  Quit smoking totally, instead of slowly cutting back on how much you smoke over a period of time.  Go to counseling. You are more likely to quit  if you go to counseling sessions regularly. Take medicine You may take medicines to help you quit. Some medicines need a prescription, and some you can buy over-the-counter. Some medicines may contain a drug called nicotine to replace the nicotine in cigarettes. Medicines may:  Help you to stop having the desire to smoke (cravings).  Help to stop the problems that come when you stop smoking (withdrawal symptoms). Your doctor may ask you to use:  Nicotine patches, gum, or lozenges.  Nicotine inhalers or sprays.  Non-nicotine medicine that is taken by mouth. Find resources Find resources and other ways to help you quit smoking and remain smoke-free after you quit. These resources are most helpful when you use them often. They include:  Online chats with a Social worker.  Phone quitlines.  Printed Furniture conservator/restorer.  Support groups or group counseling.  Text messaging programs.  Mobile phone apps. Use apps on your mobile phone or tablet that can help you stick to your quit plan. There are many free apps for mobile phones and tablets as well as websites. Examples include Quit Guide from the [employer] and smokefree.gov  What things can I do to make it easier to quit?   Talk to your family and friends. Ask them to support and encourage you.  Call a phone quitline (1-800-QUIT-NOW), reach out to support groups, or work with a Social worker.  Ask people who smoke to not smoke around you.  Avoid places that make you want to smoke, such as: ? Bars. ? Parties. ? Smoke-break areas at work.  Spend time with people who do not smoke.  Lower the stress in your life. Stress can make you want to smoke. Try these things to help your stress: ? Getting regular exercise. ? Doing deep-breathing exercises. ? Doing yoga. ?  Meditating. ? Doing a body scan. To do this, close your eyes, focus on one area of your body at a time from head to toe. Notice which parts of your body are tense. Try to relax the  muscles in those areas. How will I feel when I quit smoking? Day 1 to 3 weeks Within the first 24 hours, you may start to have some problems that come from quitting tobacco. These problems are very bad 2-3 days after you quit, but they do not often last for more than 2-3 weeks. You may get these symptoms:  Mood swings.  Feeling restless, nervous, angry, or annoyed.  Trouble concentrating.  Dizziness.  Strong desire for high-sugar foods and nicotine.  Weight gain.  Trouble pooping (constipation).  Feeling like you may vomit (nausea).  Coughing or a sore throat.  Changes in how the medicines that you take for other issues work in your body.  Depression.  Trouble sleeping (insomnia). Week 3 and afterward After the first 2-3 weeks of quitting, you may start to notice more positive results, such as:  Better sense of smell and taste.  Less coughing and sore throat.  Slower heart rate.  Lower blood pressure.  Clearer skin.  Better breathing.  Fewer sick days. Quitting smoking can be hard. Do not give up if you fail the first time. Some people need to try a few times before they succeed. Do your best to stick to your quit plan, and talk with your doctor if you have any questions or concerns. Summary  Smoking tobacco is the leading cause of preventable death. Quitting smoking can be hard, but it is one of the best things that you can do for your health.  When you decide to quit smoking, make a plan to help you succeed.  Quit smoking right away, not slowly over a period of time.  When you start quitting, seek help from your doctor, family, or friends. This information is not intended to replace advice given to you by your health care provider. Make sure you discuss any questions you have with your health care provider. Document Revised: 12/26/2018 Document Reviewed: 06/21/2018 Elsevier Patient Education  [place].

## 2019-06-03 NOTE — Progress Notes (Signed)
This visit is being conducted via phone call due to the COVID-19 pandemic. This patient has given me verbal consent via phone to conduct this visit, patient states they are participating from their home address. Some vital signs may be absent or patient reported.   Patient identification: identified by name, DOB, and current address.  Location provider: [facility], Office Persons participating in the virtual visit:  Jesse [name] and Jesse [name], Lares.    Subjective:   Jesse [name] is a 69 y.o. male who presents for an Initial Medicare Annual Wellness Visit.  Jesse [name] is doing very well. He and his wife are able to completely work from home. He gets lots of exercise on his property, doing yard work, and Marketing executive. He reports eating a healthy, balanced diet and drinking lots of water every day. He plans on getting covid vaccines and has an appointment scheduled in April.   Review of Systems  No ROS: Annual Medicare Wellness Visit Cardiac Risk Factors include: advanced age (>82men, >61 women);hypertension;male gender    Objective:    Today's Vitals   06/03/19 0818  BP: 125/80  Weight: 175 lb (79.4 kg)  Height: 5' 9.5" (1.765 m)   Body mass index is 25.47 kg/m.  Advanced Directives 06/03/2019 12/11/2016  Does Patient Have a Medical Advance Directive? Yes Yes  Type of Paramedic of [place];Living will -  Does patient want to make changes to medical advance directive? No - Patient declined -  Copy of [place] in Chart? No - copy requested -    Current Medications (verified) Outpatient Encounter Medications as of 06/03/2019  Medication Sig  . amLODipine (NORVASC) 5 MG tablet Take 1 tablet (5 mg total) by mouth daily.  Marland Kitchen atorvastatin (LIPITOR) 40 MG tablet TAKE 1 TABLET BY MOUTH  DAILY AT 6PM  . ibuprofen (ADVIL,MOTRIN) 200 MG tablet Take 200 mg by mouth as needed.  Marland Kitchen omeprazole (PRILOSEC) 40 MG capsule Take 1 capsule (40 mg  total) by mouth daily.  Marland Kitchen Specialty Vitamins Products (PROSTATE PO) Take by mouth.  . tadalafil (CIALIS) 20 MG tablet Take 1 tablet (20 mg total) by mouth daily as needed for erectile dysfunction.   Facility-Administered Encounter Medications as of 06/03/2019  Medication  . 0.9 %  sodium chloride infusion    Allergies (verified) Patient has no known allergies.   History: Past Medical History:  Diagnosis Date  . Anemia   . Cancer [facility])    bladder cancer  . DIVERTICULOSIS, COLON 01/14/2007   Qualifier: Diagnosis of  By: Paulina Fusi RN, [name]   . GASTRITIS, CHRONIC 06/01/2004   Qualifier: Diagnosis of  By: Nolon Rod CMA (AAMA), Robin    . GERD 01/09/2007   Qualifier: Diagnosis of  By: Scherrie Gerlach    . HYPERLIPIDEMIA 01/09/2007   Qualifier: Diagnosis of  By: Scherrie Gerlach    . Hypertension   . NEOPLASM, MALIGNANT, BLADDER, HX OF 2002   Qualifier: Diagnosis of  By: Nolon Rod CMA (AAMA), Robin  / no chemo or radiation  . OSTEOARTHRITIS 01/14/2007   Qualifier: Diagnosis of  By: Paulina Fusi RN, [name]   . Reflux esophagitis 06/01/2004   Qualifier: Diagnosis of  By: Nolon Rod CMA (AAMA), Robin    . TOBACCO USER 02/16/2009   Qualifier: Diagnosis of  By: Burnice Logan  MD, Doretha Sou   . VITAMIN D DEFICIENCY 06/03/2007   Qualifier: Diagnosis of  By: Burnice Logan  MD, Doretha Sou    Past Surgical  History:  Procedure Laterality Date  . COLONOSCOPY    . KNEE SURGERY  07/20/2008   bilat.  Marland Kitchen NASAL SEPTUM SURGERY  1995  . TRANSURETHRAL RESECTION OF BLADDER TUMOR  2002  . UPPER GASTROINTESTINAL ENDOSCOPY     Family History  Problem Relation Age of Onset  . Dementia Mother   . Diabetes Father   . Mental illness Father   . Peripheral vascular disease Brother    Social History   Socioeconomic History  . Marital status: Married    Spouse name: Not on file  . Number of children: 3  . Years of education: Not on file  . Highest education level: Not on file  Occupational History  . Not on file   Tobacco Use  . Smoking status: Current Every Day Smoker    Years: 40.00    Types: Cigarettes  . Smokeless tobacco: Never Used  . Tobacco comment: 8 -10 cigarettes daily  Substance and Sexual Activity  . Alcohol use: Yes    Alcohol/week: 14.0 standard drinks    Types: 14 Standard drinks or equivalent per week  . Drug use: Yes    Types: Marijuana    Comment: Smokes marijuana occasional  . Sexual activity: Not on file  Other Topics Concern  . 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 Difficulty of Paying Living Expenses: Not hard at all  Food Insecurity: No Food Insecurity  . Worried About Charity fundraiser in the Last Year: Never true  . Ran Out of Food in the Last Year: Never true  Transportation Needs: No Transportation Needs  . Lack of Transportation (Medical): No  . Lack of Transportation (Non-Medical): No  Physical Activity: Sufficiently Active  . Days of Exercise per Week: 7 days  . Minutes of Exercise per Session: 60 min  Stress: No Stress Concern Present  . Feeling of Stress : Only a little  Social Connections: Unknown  . Frequency of Communication with Friends and Family: More than three times a week  . Frequency of Social Gatherings with Friends and Family: Twice a week  . Attends Religious Services: Not on file  . Active Member of Clubs or Organizations: Yes  . Attends Archivist Meetings: More than 4 times per year  . Marital Status: Married   Tobacco Counseling Ready to quit: No Counseling given: Yes Comment: 8 -10 cigarettes daily   Clinical Intake:  Pre-visit preparation completed: Yes  Pain : No/denies pain     BMI - recorded: 25.47 Nutritional Status: BMI 25 -29 Overweight Nutritional Risks: None Diabetes: No  How often  do you need to have someone help you when you read instructions, pamphlets, or other written materials from your doctor or pharmacy?: 1 - Never  Interpreter Needed?: No  Information entered by :: Franne Forts, LPN.  Activities of Daily Living In your present state of health, do you have any difficulty performing the following activities: 06/03/2019  Hearing? N  Vision? N  Difficulty concentrating or making decisions? N  Walking or climbing stairs? N  Dressing or bathing? N  Doing errands, shopping? N  Preparing Food and eating ? N  Using the Toilet? N  In the past six months, have you accidently leaked  urine? N  Do you have problems with loss of bowel control? N  Managing your Medications? N  Managing your Finances? N  Housekeeping or managing your Housekeeping? N  Some recent data might be hidden     Immunizations and Health Maintenance Immunization History  Administered Date(s) Administered  . Fluad Quad(high Dose 65+) 01/16/2019  . Pneumococcal Conjugate-13 06/08/2015  . Pneumococcal Polysaccharide-23 09/28/2016  . Tdap 10/07/2012   There are no preventive care reminders to display for this patient.  Patient Care Team: [name], MD as PCP - General (Family Medicine)  Indicate any recent Medical Services you may have received from other than [facility] providers in the past year (date may be approximate).    Assessment:   This is a routine wellness examination for Jesse [name].  Hearing/Vision screen  Hearing Screening   125Hz  250Hz  500Hz  1000Hz  2000Hz  3000Hz  4000Hz  6000Hz  8000Hz   Right ear:           Left ear:           Comments: No hearing problems  Vision Screening Comments: Wears reading glasses; sees ophthalmologist and retinal specialist; has appointments every 3 months  Dietary issues and exercise activities discussed: Current Exercise Habits: Home exercise routine, Type of exercise: walking, Time (Minutes): > 60, Frequency (Times/Week): 7, Weekly Exercise  (Minutes/Week): 0, Intensity: Moderate, Exercise limited by: None identified  Goals    . Quit Smoking     "I'm gradually cutting down but I have a very hard time with quitting"      Depression Screen PHQ 2/9 Scores 06/03/2019 06/03/2019 10/08/2017 06/03/2015  PHQ - 2 Score 0 0 0 0    Fall Risk Fall Risk  06/03/2019 10/08/2017 06/03/2015  Falls in the past year? 0 No No  Risk for fall due to : Medication side effect - -  Follow up Falls evaluation completed;Education provided;Falls prevention discussed - -    Is the patient's home free of loose throw rugs in walkways, pet beds, electrical cords, etc?   yes      Grab bars in the bathroom? yes      Handrails on the stairs?   yes      Adequate lighting?   yes  Timed Get Up and Go performed: N/A due to phone visit  Cognitive Function:     6CIT Screen 06/03/2019  What Year? 0 points  What month? 0 points  What time? 0 points  Count back from 20 0 points  Months in reverse 0 points  Repeat phrase 0 points  Total Score 0    Screening Tests Health Maintenance  Topic Date Due  . TETANUS/TDAP  10/08/2022  . INFLUENZA VACCINE  Completed  . Hepatitis C Screening  Completed  . PNA vac Low Risk Adult  Completed    Qualifies for Shingles Vaccine? yes  Cancer Screenings: Lung: Low Dose CT Chest recommended if Age 63-80 years, 30 pack-year currently smoking OR have quit w/in 15years. Patient does qualify. Colorectal: yes; no longer necessary per Dr. Silvano Rusk  Additional Screenings:  Hepatitis C Screening: completed 09/28/2016.    Plan:   Mr. Cornelio will check with pharmacy or his insurance company about cost of shingrix vaccines. Guidance given regarding covid vaccine as well. He is up to date with all other preventive health screenings and immunizations.  I have personally reviewed and noted the following in the patient's chart:   . Medical and social history . Use of alcohol, tobacco or illicit drugs  .  Current  medications and supplements . Functional ability and status . Nutritional status . Physical activity . Advanced directives . List of other physicians . Hospitalizations, surgeries, and ER visits in previous 12 months . Vitals . Screenings to include cognitive, depression, and falls . Referrals and appointments  In addition, I have reviewed and discussed with patient certain preventive protocols, quality metrics, and best practice recommendations. A written personalized care plan for preventive services as well as general preventive health recommendations were provided to patient.     Franne Forts, LPN   0/72/1828

## 2019-06-07 ENCOUNTER — Ambulatory Visit: Payer: Medicare Other | Attending: Internal Medicine

## 2019-06-07 DIAGNOSIS — Z23 Encounter for immunization: Secondary | ICD-10-CM | POA: Insufficient documentation

## 2019-06-07 NOTE — Progress Notes (Signed)
   Covid-19 Vaccination Clinic  Name:  Jesse [name]    MRN: [id number] DOB: 1951/02/25  06/07/2019  Jesse [name] was observed post Covid-19 immunization for 15 minutes without incidence. He was provided with Vaccine Information Sheet and instruction to access the V-Safe system.   Jesse [name] was instructed to call 911 with any severe reactions post vaccine: Marland Kitchen Difficulty breathing  . Swelling of your face and throat  . A fast heartbeat  . A bad rash all over your body  . Dizziness and weakness    Immunizations Administered    Name Date Dose VIS Date Route   Pfizer COVID-19 Vaccine 06/07/2019  8:27 AM 0.3 mL 03/27/2019 Intramuscular   Manufacturer: [place]   Lot: [id number]   [place]: [id number]

## 2019-06-16 DIAGNOSIS — H353123 Nonexudative age-related macular degeneration, left eye, advanced atrophic without subfoveal involvement: Secondary | ICD-10-CM | POA: Diagnosis not present

## 2019-06-16 DIAGNOSIS — H43813 Vitreous degeneration, bilateral: Secondary | ICD-10-CM | POA: Diagnosis not present

## 2019-06-16 DIAGNOSIS — H353211 Exudative age-related macular degeneration, right eye, with active choroidal neovascularization: Secondary | ICD-10-CM | POA: Diagnosis not present

## 2019-06-30 ENCOUNTER — Ambulatory Visit: Payer: Medicare Other | Attending: Internal Medicine

## 2019-06-30 DIAGNOSIS — Z23 Encounter for immunization: Secondary | ICD-10-CM

## 2019-06-30 NOTE — Progress Notes (Signed)
   Covid-19 Vaccination Clinic  Name:  Jesse [name]    MRN: [id number] DOB: 1950/06/21  06/30/2019  [name] was observed post Covid-19 immunization for 15 minutes without incident. He was provided with Vaccine Information Sheet and instruction to access the V-Safe system.   [name] was instructed to call 911 with any severe reactions post vaccine: Marland Kitchen Difficulty breathing  . Swelling of face and throat  . A fast heartbeat  . A bad rash all over body  . Dizziness and weakness   Immunizations Administered    Name Date Dose VIS Date Route   Pfizer COVID-19 Vaccine 06/30/2019  2:49 PM 0.3 mL 03/27/2019 Intramuscular   Manufacturer: [place]   Lot: [id number]   [place]: [id number]

## 2019-08-08 DIAGNOSIS — L03019 Cellulitis of unspecified finger: Secondary | ICD-10-CM | POA: Diagnosis not present

## 2019-09-02 ENCOUNTER — Other Ambulatory Visit: Payer: Self-pay

## 2019-09-02 ENCOUNTER — Ambulatory Visit (INDEPENDENT_AMBULATORY_CARE_PROVIDER_SITE_OTHER)
Admission: RE | Admit: 2019-09-02 | Discharge: 2019-09-02 | Disposition: A | Discharge status: Home / Self Care | Payer: Medicare Other | Source: Ambulatory Visit | Attending: Acute Care | Admitting: Acute Care

## 2019-09-02 DIAGNOSIS — Z122 Encounter for screening for malignant neoplasm of respiratory organs: Secondary | ICD-10-CM | POA: Diagnosis not present

## 2019-09-02 DIAGNOSIS — F17210 Nicotine dependence, cigarettes, uncomplicated: Secondary | ICD-10-CM

## 2019-09-02 DIAGNOSIS — Z87891 Personal history of nicotine dependence: Secondary | ICD-10-CM

## 2019-09-04 ENCOUNTER — Telehealth: Payer: Self-pay | Admitting: Acute Care

## 2019-09-04 NOTE — Telephone Encounter (Signed)
Pt has been added

## 2019-09-04 NOTE — Telephone Encounter (Signed)
Thanks so much. 

## 2019-09-04 NOTE — Telephone Encounter (Signed)
Can we add this patient to my schedule as a tele visit Monday at 09:30? I need to discuss his

## 2019-09-07 ENCOUNTER — Encounter: Payer: Self-pay | Admitting: Acute Care

## 2019-09-07 ENCOUNTER — Other Ambulatory Visit: Payer: Self-pay

## 2019-09-07 ENCOUNTER — Ambulatory Visit (INDEPENDENT_AMBULATORY_CARE_PROVIDER_SITE_OTHER): Payer: Medicare Other | Admitting: Acute Care

## 2019-09-07 DIAGNOSIS — Z122 Encounter for screening for malignant neoplasm of respiratory organs: Secondary | ICD-10-CM

## 2019-09-07 DIAGNOSIS — F17200 Nicotine dependence, unspecified, uncomplicated: Secondary | ICD-10-CM

## 2019-09-07 DIAGNOSIS — F17210 Nicotine dependence, cigarettes, uncomplicated: Secondary | ICD-10-CM

## 2019-09-07 DIAGNOSIS — R911 Solitary pulmonary nodule: Secondary | ICD-10-CM | POA: Diagnosis not present

## 2019-09-07 MED ORDER — CHANTIX STARTING MONTH PAK 0.5 MG X 11 & 1 MG X 42 PO TABS: ORAL_TABLET | ORAL | 0 refills | Status: DC

## 2019-09-07 NOTE — Addendum Note (Signed)
Addended by: Eric Form F on: 09/07/2019 09:59 AM   Modules accepted: Orders

## 2019-09-07 NOTE — Patient Instructions (Addendum)
It was good to talk with you today. Your Low Dose CT was abnormal this year and requires some follow up diagnostic testing We will order and schedule PFT's( Pulmonary Function Tests) We will order and schedule a PET scan Follow up in office after both with Dr. Lamonte Sakai Icard>> We will call to schedule once we have the results of the tests. Please work on quitting smoking completely I have sent in a Prescription for a Chantix starter pack Stop the medication for disturbing dreams. Call the office  if you  need to stop the medication. We will arrange for a pharmacy visit to discuss other options for smoking cessation. Call the office if you need [place] sooner, [phone] Please contact office for sooner follow up if symptoms do not improve or worsen or seek emergency care

## 2019-09-07 NOTE — Progress Notes (Signed)
Virtual Visit via Telephone Note  I connected with Jesse [name] on 09/07/19 at  9:30 AM EDT by telephone and verified that I am speaking with the correct person using two identifiers.  Location: Patient: At Home Provider: Working remotely from home   I discussed the limitations, risks, security and privacy concerns of performing an evaluation and management service by telephone and the availability of in person appointments. I also discussed with the patient that there may be a patient responsible charge related to this service. The patient expressed understanding and agreed to proceed.  Synopsis  69 year old male current every day smoker with a 40 pack year smoking history followed through the [place].  History of Present Illness: Pt. Presents for follow up of his initial LDCD. His scan was read as a Lung RADS 4 B indicates suspicious findings for which additional diagnostic testing and or tissue sampling is recommended. I reviewed his scan with Dr. Baltazar Apo 09/03/2019. I explained that his scan this year came back as abnormal.I explained that there had been interval growth in a Posterior left upper lobe lung nodule which had increased in size from 4.9 mm to 8.2 mm. I explained that next steps are for PET scan and PFT's. I gave a full explanation of both scans. Pt. verbalized understanding and agreed with the plan.  We alsp discussed the need to quit smoking. He had tried Chantix several years ago, and wanted to try it again. He states he did have strange dreams on the medication, but they were not dreams of self harm. I have told him I will send in a started pack, and if the dreams become too much to call the office so we can have his see a pharmacist for a visit with pharmacy  To go over all smoking cessation medication options. He verbalized understanding and agreed to stop the Chantix if his dreams became too disturbing, and to call the office for a smoking cessation  visit with Pharmacy..    Observations/Objective: 09/02/2019 LDCT Lungs/Pleura: Paraseptal and centrilobular emphysema with diffuse bronchial wall thickening noted. Multiple pulmonary nodules are again identified scattered throughout both lungs. Posterior left upper lobe lung nodule has increased in size when compared with the previous exam, image 173/3. On today's study this has a mean derived diameter of 8.2 mm. Previously 4.9 mm. Other lung nodules remain Unchanged.  Lung-RADS 4B, suspicious. Additional imaging evaluation or consultation with Pulmonology or Thoracic Surgery recommended. Diffuse bronchial wall thickening with emphysema, as above; imaging findings suggestive of underlying COPD. Aortic atherosclerosis, in addition to 3 vessel coronary artery disease. Please note that although the presence of coronary artery calcium documents the presence of coronary artery disease, the severity of this disease and any potential stenosis cannot be assessed on this non-gated CT examination. Assessment for potential risk factor modification, dietary therapy or pharmacologic therapy may be warranted, if clinically indicated.  Assessment and Plan: Abnormal Low Dose CT Chest for Lung Cancer Screening in Current every day smoker Lung RADS 4 B indicates suspicious findings for which additional diagnostic testing and or tissue sampling is recommended. Plan PFT's PET scan Follow up in office after both with Dr. Lamonte Sakai Icard Smoking cessation counseling  Please work on quitting smoking completely Prescription for Chantix starter pack Stop the medication for disturbing dreams. Call the office  if you  need to stop the medication. We will arrange for a pharmacy visit to discuss other options for smoking cessation.   Follow  Up Instructions: Follow up appointment with Dr. Lamonte Sakai or Dr. Valeta Harms after PFT's and PET scan have been completed.    I discussed the assessment and treatment plan with  the patient. The patient was provided an opportunity to ask questions and all were answered. The patient agreed with the plan and demonstrated an understanding of the instructions.   The patient was advised to call back or seek an in-person evaluation if the symptoms worsen or if the condition fails to improve as anticipated.  I provided 23 minutes of non-face-to-face time during this encounter.   Magdalen Spatz, NP 09/07/2019

## 2019-09-08 NOTE — Progress Notes (Signed)
Keep on tickle list in the event this is not cancer. Thanks so much

## 2019-09-08 NOTE — Progress Notes (Signed)
Please see tele visit note dated  5/24.

## 2019-09-12 ENCOUNTER — Other Ambulatory Visit (HOSPITAL_COMMUNITY): Payer: Medicare Other

## 2019-09-16 ENCOUNTER — Other Ambulatory Visit: Payer: Self-pay

## 2019-09-16 ENCOUNTER — Ambulatory Visit (HOSPITAL_COMMUNITY)
Admission: RE | Admit: 2019-09-16 | Discharge: 2019-09-16 | Disposition: A | Discharge status: Home / Self Care | Payer: Medicare Other | Source: Ambulatory Visit | Attending: Acute Care | Admitting: Acute Care

## 2019-09-16 DIAGNOSIS — R911 Solitary pulmonary nodule: Secondary | ICD-10-CM

## 2019-09-16 DIAGNOSIS — Z8551 Personal history of malignant neoplasm of bladder: Secondary | ICD-10-CM | POA: Insufficient documentation

## 2019-09-16 LAB — GLUCOSE, CAPILLARY: Glucose-Capillary: 110 mg/dL — ABNORMAL HIGH (ref 70–99)

## 2019-09-16 MED ORDER — FLUDEOXYGLUCOSE F - 18 (FDG) INJECTION
8.9000 | Freq: Once | INTRAVENOUS | Status: AC
  Administered 2019-09-16: 8.9 via INTRAVENOUS

## 2019-09-17 ENCOUNTER — Telehealth: Payer: Self-pay | Admitting: Family Medicine

## 2019-09-17 ENCOUNTER — Telehealth: Payer: Self-pay

## 2019-09-17 NOTE — Telephone Encounter (Signed)
FYI

## 2019-09-17 NOTE — Telephone Encounter (Signed)
This will be called to the patient. Thanks so much

## 2019-09-17 NOTE — Telephone Encounter (Signed)
Sarah a nurse practitioner with [facility], is requesting to speak to you regarding pt. Pt has lung cancer and a complicated scan has been detected. Pt has a mass in rectum. Per Judson Roch, they will take care of him but wanted Dr. Volanda Napoleon, to be aware. Judson Roch, will also scan his results in [place]. Judson Roch can be reached at [phone]. Thanks

## 2019-09-17 NOTE — Progress Notes (Signed)
I have called these results to the patient. I explained that the pulmonary nodule is hypermetabolic and concerning for cancer. Additionally we discussed the incidental finding on the PET of  the mass between his rectum and anus.  I have called and left a message  with his PCP, in addition to sending the scans through Epic for her to see. I explained that we will manage the pulmonary nodule, and have asked that they manage the incidental finding rectal of the mass . I have left my cell number for her to call if needed.  Jesse [name] has PFT's scheduled. He needs to be scheduled with either Dr. Lamonte Sakai or Icard  for tissue sampling.   Jonelle Sidle, please schedule with either [place] or Icard as soon as  PFT's have been completed.If there are no appointments, please call me and let me know.

## 2019-09-17 NOTE — Telephone Encounter (Signed)
Call report for SG.   IMPRESSION: 1. Hypermetabolic nodule in the LEFT upper lobe is concerning for primary bronchogenic carcinoma (stage IA). 2. No hypermetabolic mediastinal lymph nodes or distant metastatic disease. 3. Large round peripherally hypermetabolic mass position between the distal rectum, anus and posterior wall the bladder of unclear etiology. 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 Recommend CT or of the pelvis with contrast for further characterization. Mass likely palpable by digital rectal exam.  These results will be called to the ordering clinician or representative by the Radiologist Assistant, and communication documented in the PACS or [employer].   Electronically Signed   By: Suzy Bouchard M.D.   On: 09/17/2019 09:56

## 2019-09-18 ENCOUNTER — Telehealth: Payer: Self-pay | Admitting: Family Medicine

## 2019-09-18 ENCOUNTER — Other Ambulatory Visit: Payer: Self-pay | Admitting: Family Medicine

## 2019-09-18 DIAGNOSIS — R1907 Generalized intra-abdominal and pelvic swelling, mass and lump: Secondary | ICD-10-CM

## 2019-09-18 NOTE — Telephone Encounter (Signed)
Pt is requesting to speak to Dr. Volanda Napoleon regarding a pet scan he had and results came back with a few issues. Pt's neurologist has retired and would like recommendations or a referral with a neurologist. Thanks

## 2019-09-18 NOTE — Telephone Encounter (Signed)
Please advise 

## 2019-09-21 NOTE — Telephone Encounter (Signed)
Please have pt schedule an appt when he can.

## 2019-09-22 ENCOUNTER — Other Ambulatory Visit (HOSPITAL_COMMUNITY)
Admission: RE | Admit: 2019-09-22 | Discharge: 2019-09-22 | Disposition: A | Discharge status: Home / Self Care | Payer: Medicare Other | Source: Ambulatory Visit | Attending: Acute Care | Admitting: Acute Care

## 2019-09-22 DIAGNOSIS — Z01812 Encounter for preprocedural laboratory examination: Secondary | ICD-10-CM | POA: Diagnosis not present

## 2019-09-22 DIAGNOSIS — Z20822 Contact with and (suspected) exposure to covid-19: Secondary | ICD-10-CM | POA: Diagnosis not present

## 2019-09-22 LAB — SARS CORONAVIRUS 2 (TAT 6-24 HRS): SARS Coronavirus 2: NEGATIVE

## 2019-09-24 ENCOUNTER — Other Ambulatory Visit: Payer: Self-pay

## 2019-09-24 NOTE — Telephone Encounter (Signed)
Pt scheduled for an office visit on 09/25/2019 at 9.30 am

## 2019-09-25 ENCOUNTER — Ambulatory Visit (INDEPENDENT_AMBULATORY_CARE_PROVIDER_SITE_OTHER): Payer: Medicare Other | Admitting: Family Medicine

## 2019-09-25 ENCOUNTER — Encounter: Payer: Self-pay | Admitting: Family Medicine

## 2019-09-25 ENCOUNTER — Ambulatory Visit (HOSPITAL_COMMUNITY)
Admission: RE | Admit: 2019-09-25 | Discharge: 2019-09-25 | Disposition: A | Discharge status: Home / Self Care | Payer: Medicare Other | Source: Ambulatory Visit | Attending: Acute Care | Admitting: Acute Care

## 2019-09-25 VITALS — BP 138/90 | HR 88 | Temp 98.3°F | Wt 174.0 lb

## 2019-09-25 DIAGNOSIS — R1900 Intra-abdominal and pelvic swelling, mass and lump, unspecified site: Secondary | ICD-10-CM | POA: Diagnosis not present

## 2019-09-25 DIAGNOSIS — R911 Solitary pulmonary nodule: Secondary | ICD-10-CM

## 2019-09-25 DIAGNOSIS — F17210 Nicotine dependence, cigarettes, uncomplicated: Secondary | ICD-10-CM

## 2019-09-25 DIAGNOSIS — F17200 Nicotine dependence, unspecified, uncomplicated: Secondary | ICD-10-CM

## 2019-09-25 DIAGNOSIS — R918 Other nonspecific abnormal finding of lung field: Secondary | ICD-10-CM | POA: Diagnosis not present

## 2019-09-25 DIAGNOSIS — J988 Other specified respiratory disorders: Secondary | ICD-10-CM | POA: Insufficient documentation

## 2019-09-25 DIAGNOSIS — F17218 Nicotine dependence, cigarettes, with other nicotine-induced disorders: Secondary | ICD-10-CM | POA: Diagnosis not present

## 2019-09-25 LAB — PULMONARY FUNCTION TEST
DL/VA % pred: 95 %
DL/VA: 3.87 ml/min/mmHg/L
DLCO unc % pred: 107 %
DLCO unc: 27.02 ml/min/mmHg
FEF 25-75 Post: 2.05 L/sec
FEF 25-75 Pre: 1.71 L/sec
FEF2575-%Change-Post: 20 %
FEF2575-%Pred-Post: 86 %
FEF2575-%Pred-Pre: 71 %
FEV1-%Change-Post: 7 %
FEV1-%Pred-Post: 106 %
FEV1-%Pred-Pre: 99 %
FEV1-Post: 3.33 L
FEV1-Pre: 3.1 L
FEV1FVC-%Change-Post: 5 %
FEV1FVC-%Pred-Pre: 87 %
FEV6-%Change-Post: 1 %
FEV6-%Pred-Post: 116 %
FEV6-%Pred-Pre: 114 %
FEV6-Post: 4.67 L
FEV6-Pre: 4.58 L
FEV6FVC-%Change-Post: 0 %
FEV6FVC-%Pred-Post: 100 %
FEV6FVC-%Pred-Pre: 100 %
FVC-%Change-Post: 1 %
FVC-%Pred-Post: 115 %
FVC-%Pred-Pre: 113 %
FVC-Post: 4.92 L
FVC-Pre: 4.84 L
Post FEV1/FVC ratio: 68 %
Post FEV6/FVC ratio: 95 %
Pre FEV1/FVC ratio: 64 %
Pre FEV6/FVC Ratio: 95 %
RV % pred: 100 %
RV: 2.42 L
TLC % pred: 104 %
TLC: 7.12 L

## 2019-09-25 MED ORDER — ALBUTEROL SULFATE (2.5 MG/3ML) 0.083% IN NEBU
2.5000 mg | INHALATION_SOLUTION | Freq: Once | RESPIRATORY_TRACT | Status: AC
  Administered 2019-09-25: 2.5 mg via RESPIRATORY_TRACT

## 2019-09-25 NOTE — Patient Instructions (Addendum)
[facility] Address: [address], [place], [place] [address] Phone: [phone] Their office will contact pt to schedule directly Coping with Quitting Smoking  Quitting smoking is a physical and mental challenge. You will face cravings, withdrawal symptoms, and temptation. Before quitting, work with your health care provider to make a plan that can help you cope. Preparation can help you quit and keep you from giving in. How can I cope with cravings? Cravings usually last for 5-10 minutes. If you get through it, the craving will pass. Consider taking the following actions to help you cope with cravings:  Keep your mouth busy: ? Chew sugar-free gum. ? Suck on hard candies or a straw. ? Brush your teeth.  Keep your hands and body busy: ? Immediately change to a different activity when you feel a craving. ? Squeeze or play with a ball. ? Do an activity or a hobby, like making bead jewelry, practicing needlepoint, or working with wood. ? Mix up your normal routine. ? Take a short exercise break. Go for a quick walk or run up and down stairs. ? Spend time in public places where smoking is not allowed.  Focus on doing something kind or helpful for someone else.  Call a friend or family member to talk during a craving.  Join a support group.  Call a quit line, such as 1-800-QUIT-NOW.  Talk with your health care provider about medicines that might help you cope with cravings and make quitting easier for you. How can I deal with withdrawal symptoms? Your body may experience negative effects as it tries to get used to not having nicotine in the system. These effects are called withdrawal symptoms. They may include:  Feeling hungrier than normal.  Trouble concentrating.  Irritability.  Trouble sleeping.  Feeling depressed.  Restlessness and agitation.  Craving a cigarette. To manage withdrawal  symptoms:  Avoid places, people, and activities that trigger your cravings.  Remember why you want to quit.  Get plenty of sleep.  Avoid coffee and other caffeinated drinks. These may worsen some of your symptoms. How can I handle social situations? Social situations can be difficult when you are quitting smoking, especially in the first few weeks. To manage this, you can:  Avoid parties, bars, and other social situations where people might be smoking.  Avoid alcohol.  Leave right away if you have the urge to smoke.  Explain to your family and friends that you are quitting smoking. Ask for understanding and support.  Plan activities with friends or family where smoking is not an option. What are some ways I can cope with stress? Wanting to smoke may cause stress, and stress can make you want to smoke. Find ways to manage your stress. Relaxation techniques can help. For example:  Breathe slowly and deeply, in through your nose and out through your mouth.  Listen to soothing, relaxing music.  Talk with a family member or friend about your stress.  Light a candle.  Soak in a bath or take a shower.  Think about a peaceful place. What are some ways I can prevent weight gain? Be aware that many people gain weight after they quit smoking. However, not everyone does. To keep from gaining weight, have a plan in place before you quit and stick to the plan after you quit. Your plan should include:  Having healthy snacks. When you have a craving, it may help to: ?  Eat plain popcorn, crunchy carrots, celery, or other cut vegetables. ? Chew sugar-free gum.  Changing how you eat: ? Eat small portion sizes at meals. ? Eat 4-6 small meals throughout the day instead of 1-2 large meals a day. ? Be mindful when you eat. Do not watch television or do other things that might distract you as you eat.  Exercising regularly: ? Make time to exercise each day. If you do not have time for a long  workout, do short bouts of exercise for 5-10 minutes several times a day. ? Do some form of strengthening exercise, like weight lifting, and some form of aerobic exercise, like running or swimming.  Drinking plenty of water or other low-calorie or no-calorie drinks. Drink 6-8 glasses of water daily, or as much as instructed by your health care provider. Summary  Quitting smoking is a physical and mental challenge. You will face cravings, withdrawal symptoms, and temptation to smoke again. Preparation can help you as you go through these challenges.  You can cope with cravings by keeping your mouth busy (such as by chewing gum), keeping your body and hands busy, and making calls to family, friends, or a helpline for people who want to quit smoking.  You can cope with withdrawal symptoms by avoiding places where people smoke, avoiding drinks with caffeine, and getting plenty of rest.  Ask your health care provider about the different ways to prevent weight gain, avoid stress, and handle social situations. This information is not intended to replace advice given to you by your health care provider. Make sure you discuss any questions you have with your health care provider. Document Revised: 03/15/2017 Document Reviewed: 03/30/2016 Elsevier Patient Education  2020 [employer].

## 2019-09-25 NOTE — Progress Notes (Signed)
Subjective:    Patient ID: Jesse [name], male    DOB: 11/20/1950, 69 y.o.   MRN: [id number]  No chief complaint on file.   HPI Patient was seen today for f/u on recent imaging results.  Pt with lung nodule that increased in size from 4.9 mm to 8.2 mm on CT chest 5/19.  Pt had a PET scan which revealed a large mass between the rectum and posterior bladder wall.  CT abd/pelvis was ordered, but pt has not heard received a call about this.  Overall patient states he is doing well.  He is trying to quit smoking.  Currently smoking 1 pack every 4 days.  Was smoking 1/2 to 2 packs/day.  Using Nicorette gum and plans to restart Chantix.   In the past-controlled recently Chantix.  Also tried patches.  Patient notes PFTs scheduled later today.  Pt denies blood in stools, nausea, vomiting, SOB, hematuria, coughing.  May have pt SOB sensation at night that quickly resolves.    Past Medical History:  Diagnosis Date  . Anemia   . Cancer [facility])    bladder cancer  . DIVERTICULOSIS, COLON 01/14/2007   Qualifier: Diagnosis of  By: Paulina Fusi RN, [name]   . GASTRITIS, CHRONIC 06/01/2004   Qualifier: Diagnosis of  By: Nolon Rod CMA (AAMA), Robin    . GERD 01/09/2007   Qualifier: Diagnosis of  By: Scherrie Gerlach    . HYPERLIPIDEMIA 01/09/2007   Qualifier: Diagnosis of  By: Scherrie Gerlach    . Hypertension   . NEOPLASM, MALIGNANT, BLADDER, HX OF 2002   Qualifier: Diagnosis of  By: Nolon Rod CMA (AAMA), Robin  / no chemo or radiation  . OSTEOARTHRITIS 01/14/2007   Qualifier: Diagnosis of  By: Paulina Fusi RN, [name]   . Reflux esophagitis 06/01/2004   Qualifier: Diagnosis of  By: Nolon Rod CMA (AAMA), Robin    . TOBACCO USER 02/16/2009   Qualifier: Diagnosis of  By: Burnice Logan  MD, Doretha Sou   . VITAMIN D DEFICIENCY 06/03/2007   Qualifier: Diagnosis of  By: Burnice Logan  MD, Doretha Sou     No Known Allergies  ROS General: Denies fever, chills, night sweats, changes in weight, changes in appetite HEENT: Denies  headaches, ear pain, changes in vision, rhinorrhea, sore throat CV: Denies CP, palpitations, SOB, orthopnea  Pulm: Denies SOB, cough, wheezing  +lung mass GI: Denies abdominal pain, nausea, vomiting, diarrhea, constipation  +mass between rectum/bladder GU: Denies dysuria, hematuria, frequency   Msk: Denies muscle cramps, joint pains Neuro: Denies weakness, numbness, tingling Skin: Denies rashes, bruising Psych: Denies depression, anxiety, hallucinations     Objective:    Blood pressure 138/90, pulse 88, temperature 98.3 F (36.8 C), temperature source Temporal, weight 174 lb (78.9 kg), SpO2 98 %.   Gen. Pleasant, well-nourished, in no distress, normal affect   HEENT: [place]/AT, face symmetric, no scleral icterus, PERRLA, EOMI, nares patent without drainage, pharynx without erythema or exudate. Neck: No JVD, no thyromegaly, no carotid bruits Lungs: no accessory muscle use Cardiovascular: RRR, no peripheral edema GU: Normal external male genitalia, no hemorrhoids or rashes noted at anus,  Palpable mass appreciated pushing against rectum, unable to fully palpate prostate 2/2 mass.  Rectal vault smooth with minimal amount of stool present.  Hemoccult stool card negative. Musculoskeletal: No deformities, no cyanosis or clubbing, normal tone Neuro:  A&Ox3, CN II-XII intact, normal gait Skin:  Warm, no lesions/ rash   Wt Readings from Last 3 Encounters:  09/25/19 174 lb (78.9  kg)  06/03/19 175 lb (79.4 kg)  10/22/18 179 lb (81.2 kg)    Lab Results  Component Value Date   WBC 5.3 10/22/2018   HGB 14.6 10/22/2018   HCT 42.7 10/22/2018   PLT 127.0 (L) 10/22/2018   GLUCOSE 114 (H) 10/22/2018   CHOL 191 10/22/2018   TRIG 211.0 (H) 10/22/2018   HDL 42.50 10/22/2018   LDLDIRECT 102.0 10/22/2018   ALT 18 10/08/2017   AST 19 10/08/2017   NA 141 10/22/2018   K 5.1 10/22/2018   CL 104 10/22/2018   CREATININE 0.72 10/22/2018   BUN 21 10/22/2018   CO2 27 10/22/2018   TSH 1.23 10/08/2017    PSA 1.43 10/22/2018   HGBA1C 5.8 10/22/2018    Assessment/Plan:  Abnormal CT lung screening -PET scan done 09/16/19 -mass increased in size from 4.9 mm to 8.2 mm on yearly low dose CT. -continue f/u with Pulm -keep appt for PFTs today.  Mass of pelvis -noted on PET scan.  Round mass 4.6 cm with intense peripheral metabolic activity and central photopenia and low-attenuation suggesting a malignant necrotic mass.  Posterior to the prostate and anterior to the distal rectum and slightly superior to the anus. -appreciated on exam.  -stools guaiac negative. -CT abd pelvis w/ contrast ordered 10/09/19.  Will check on status. -pt's questions answered to satisfaction.    Cigarette nicotine dependence with other nicotine-induced disorder -smoking 1 pk q 4 days. -smoking cessation >3 min, <10 min  -restarting Chantix.  If vivid dreams reoccur, discussed other options for smoking cessation -given 1-800-QUIT NOW info  F/u prn.  Has appt in 1 month for CPE.  Grier Mitts, MD

## 2019-09-28 ENCOUNTER — Encounter: Payer: Self-pay | Admitting: Family Medicine

## 2019-09-28 NOTE — Telephone Encounter (Signed)
Sarah, please see pt's mychart message and advise.

## 2019-09-29 NOTE — Telephone Encounter (Signed)
SG sent me a message on this but there are no appts with [facility] or [facility] anytime soon. Dr. Valeta Harms has July 7th for lung mass. [facility] July 16 has an opening. Please advise.

## 2019-09-30 NOTE — Telephone Encounter (Signed)
Pt is scheduled 6/23

## 2019-09-30 NOTE — Telephone Encounter (Signed)
Message from [place]: Magdalen Spatz, NP  You 1 hour ago (1:30 PM)   Raquel Sarna,  He needs to be scheduled with me next Wednesday if I have any openings. Are there any docs in the office this week with openings who can see him sooner?   Message text    Attempted to call pt but unable to reach. Left message for pt to return call to get appt scheduled with Sarah to discuss results of PFT. I am also going to route this to front desk pool so they can help out with this. Please schedule pt for appt (in-office or televisit with Eric Form) as soon as possible to have results of PFT discussed with him.

## 2019-10-01 ENCOUNTER — Telehealth: Payer: Self-pay | Admitting: Acute Care

## 2019-10-01 DIAGNOSIS — R911 Solitary pulmonary nodule: Secondary | ICD-10-CM

## 2019-10-01 NOTE — Telephone Encounter (Signed)
I will place orders. Days are are booked in the afternoons next week.   I will schedule for 29th in the morning.   We will need re-arrange the schedule that day. I have a 730 AM and I will need this case to follow.   RB has two cases already and wants them pushed back to the afternoon if possible.  I have spoke to rob about this.    Garner Nash, DO [facility] 10/01/2019 5:17 PM

## 2019-10-01 NOTE — Addendum Note (Signed)
Addended by: Garner Nash on: 10/01/2019 05:24 PM   Modules accepted: Orders

## 2019-10-01 NOTE — Telephone Encounter (Signed)
I have called the patient with the results of the PE scan. I have spoken with Dr. Valeta Harms. I explained that the nodule is hypermetabolic on PET scan. It appears to be 1A disease.I explained that because of the history of bladder cancer we need tissue diagnosis to determine if this is a bladder mets, vs a new lung cancer primary. I told the patient we can attempt biopsy for tissue sampling, but due to the size of the nodule ( on the small size)  there is a 60% chance of actual positive diagnosis. I explained that the other option at this point was to wait 2-3 months, see if the nodule grows to improve likelihood of positive tissue diagnosis with a larger nodule biopsy. Risks in waiting include increase in size of nodule and further metastasis. Pt. Verbalized understanding of above. He would like to proceed with biopsy one day next week, as Dr. Candida Peeling have openings in the afternoons next week. I explained that he would need a super D CT scan which will give the Dr. Further information to allow for navigation to the nodule during the procedure. I told him he will be getting calls to get this scheduled for next week. He verbalized understanding of the above.  The only day next week that he cannot do is Wednesday, as he is having a CT Abdomen and pelvis to better evaluate the mass in his rectum/ bladder. Marland Kitchen

## 2019-10-06 ENCOUNTER — Other Ambulatory Visit: Payer: Self-pay

## 2019-10-06 ENCOUNTER — Ambulatory Visit (INDEPENDENT_AMBULATORY_CARE_PROVIDER_SITE_OTHER)
Admission: RE | Admit: 2019-10-06 | Discharge: 2019-10-06 | Disposition: A | Discharge status: Home / Self Care | Payer: Medicare Other | Source: Ambulatory Visit | Attending: Pulmonary Disease | Admitting: Pulmonary Disease

## 2019-10-06 DIAGNOSIS — J432 Centrilobular emphysema: Secondary | ICD-10-CM | POA: Diagnosis not present

## 2019-10-06 DIAGNOSIS — R911 Solitary pulmonary nodule: Secondary | ICD-10-CM

## 2019-10-06 DIAGNOSIS — I77810 Thoracic aortic ectasia: Secondary | ICD-10-CM | POA: Diagnosis not present

## 2019-10-06 NOTE — Telephone Encounter (Signed)
Routing to [facility] pool as this is for a ENB, please see Dr. Fabio Bering previous note  Please schedule the following:   Diagnosis: lung nodule  Procedure: ENB  Anesthesia: yes  Do you need Fluro? yes  Priority: high  Date: 10/13/2019  Alternate Date: none  Time: AM - only  Location: [facility]  Does patient have OSA? no DM? no Or Latex allergy? no  Medication Restriction: none  Anticoagulate/Antiplatelet: none  Pre-op Labs Ordered: CBC, CMP, PT/INR, PTT  Imaging request: Needs SUPERD CT completed (he has an abd/pelvis ct complete)    (If, SuperDimension CT Chest, please have STAT courier sent to [place].)   Please coordinate Pre-op COVID Testing

## 2019-10-07 ENCOUNTER — Ambulatory Visit (INDEPENDENT_AMBULATORY_CARE_PROVIDER_SITE_OTHER): Payer: Medicare Other | Admitting: Acute Care

## 2019-10-07 ENCOUNTER — Inpatient Hospital Stay: Admission: RE | Admit: 2019-10-07 | Payer: Medicare Other | Source: Ambulatory Visit

## 2019-10-07 ENCOUNTER — Encounter: Payer: Self-pay | Admitting: Acute Care

## 2019-10-07 DIAGNOSIS — R918 Other nonspecific abnormal finding of lung field: Secondary | ICD-10-CM | POA: Diagnosis not present

## 2019-10-07 DIAGNOSIS — F17210 Nicotine dependence, cigarettes, uncomplicated: Secondary | ICD-10-CM | POA: Diagnosis not present

## 2019-10-07 DIAGNOSIS — Z87891 Personal history of nicotine dependence: Secondary | ICD-10-CM

## 2019-10-07 NOTE — H&P (View-Only) (Signed)
Virtual Visit via Telephone Note  I connected with Jesse [name] on 10/07/19 at  3:30 PM EDT by telephone and verified that I am speaking with the correct person using two identifiers.  Location: Patient: Home Provider: Working remotely from home   I discussed the limitations, risks, security and privacy concerns of performing an evaluation and management service by telephone and the availability of in person appointments. I also discussed with the patient that there may be a patient responsible charge related to this service. The patient expressed understanding and agreed to proceed.  Synopsis Pt. Is followed by the screening program. He was found to have a LUL mass ( Lung RADS 4 B indicates suspicious findings for which additional diagnostic testing and or tissue sampling is recommended.). He has had follow up imaging and is being scheduled for ENB for tissue sampling tentatively scheduled 6/29 at 07:30.   History of Present Illness: Pt. Presents for follow up of PFT's done as part of work up for abnormal Screening CT/ PET scan. He is scheduled for ENB 10/13/2019 at 7:30 am with Dr. Valeta Harms for tissue sampling. PFT's are adequate for surgery should that be determined to be best treatment option for Jesse [name].He has been started on a Chantix starter pack to help with smoking cessation.   Pt. States she has been  Doing well. We reviewed his PFT's and discussed that they look good for surgery if that is part of his plan of care. Marland Kitchen He states he swims daily, which he thinks has helped his lung volumes. He denies any chest pain , hemoptysis or orthopnea. He has not been called about his ENB date and time. I have called th office, but Ashley Akin , RN is not in this afternoon. I have messaged her as high priority to call the patient in the morning. I have told the patient he should hear something about the schedule in the morning. He verbalized understanding.     Observations/Objective: 5/19/021>>LDCT Chest Lung-RADS 4B, suspicious. Additional imaging evaluation or consultation with Pulmonology or Thoracic Surgery recommended. 2. Diffuse bronchial wall thickening with emphysema, as above; imaging findings suggestive of underlying COPD. 3. Aortic atherosclerosis, in addition to 3 vessel coronary artery Disease.  09/16/2019 PET Scan Hypermetabolic nodule in the LEFT upper lobe is concerning for primary bronchogenic carcinoma (stage IA). 2. No hypermetabolic mediastinal lymph nodes or distant metastatic disease. 3. Large round peripherally hypermetabolic mass position between the distal rectum, anus and posterior wall the bladder of unclear etiology. 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 Recommend CT or of the pelvis with contrast for further characterization. Mass likely palpable by digital rectal exam.  10/06/2019: Super D Chest CT without Contrast>> For ENB 1.2 x 0.8 x 0.5 cm left upper lobe nodule appears similar to the prior examination. Given the hypermetabolism on the prior PET-CT, this remains concerning for potential primary bronchogenic neoplasm. 2. Mild diffuse bronchial wall thickening with mild centrilobular and paraseptal emphysema; imaging findings suggestive of underlying COPD. 3. Aortic atherosclerosis, in addition to left main and 3 vessel coronary artery disease. Please note that although the presence of coronary artery calcium documents the presence of coronary artery disease, the severity of this disease and any potential stenosis cannot be assessed on this non-gated CT examination. Assessment for potential risk factor modification, dietary therapy or pharmacologic therapy may be warranted, if clinically indicated. 4. There are calcifications of the aortic valve. Echocardiographic  correlation for evaluation of potential valvular  dysfunction may be warranted if clinically indicated. 5. 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 Recommend annual imaging followup by CTA or MRA. This recommendation follows 2010 ACCF/AHA/AATS/ACR/ASA/SCA/SCAI/SIR/STS/SVM Guidelines for the Diagnosis and Management of Patients with Thoracic Aortic Disease. Circulation. 2010; 121: G315-V761. Aortic aneurysm NOS (ICD10-I71.9).  Aortic Atherosclerosis (ICD10-I70.0) and Emphysema (ICD10-J43.9).  Pulmonary Functions Testing Results:        Assessment and Plan: Hypermetabolic LUL pulmonary Nodule concerning for bronchogenic Lung Cancer  PFT's show mild obstruction with good lung volumes Plan Scheduled for ENB for tissue sampling Office to call patient and confirm date and time ( Tentatively scheduled for 6/29 at 7:30 am with Dr. Valeta Harms) Follow up after procedure with CXR prior Please call the office for any questions or concerns, or if you do not hear from Ashley Akin RN in the morning ( 6/24) Please contact office for sooner follow up if symptoms do not improve or worsen or seek emergency care   Tobacco Abuse Started Chantix Started Pack Plan Continue to work on smoking cessation.  Large round peripherally hypermetabolic mass position between the distal rectum, anus and posterior wall the bladder of unclear etiology. 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 Plan CT Abdomen and Pelvis scheduled for 10/08/2019    Follow Up Instructions: ENB is tentatively scheduled for 10/13/2019 at 07:30 am with Dr. Valeta Harms. Will need follow up after procedure in the office with CXR prior.  Consider need for inhaler therapy at follow up.    I discussed the assessment and treatment plan with the patient. The patient was provided an opportunity to ask questions and all were answered. The patient agreed with the plan and demonstrated an  understanding of the instructions.   The patient was advised to call back or seek an in-person evaluation if the symptoms worsen or if the condition fails to improve as anticipated.  I provided 30  minutes of non-face-to-face time during this encounter.   Magdalen Spatz, NP 10/07/2019 3:56 PM

## 2019-10-07 NOTE — Progress Notes (Signed)
Virtual Visit via Telephone Note  I connected with Jesse [name] on 10/07/19 at  3:30 PM EDT by telephone and verified that I am speaking with the correct person using two identifiers.  Location: Patient: Home Provider: Working remotely from home   I discussed the limitations, risks, security and privacy concerns of performing an evaluation and management service by telephone and the availability of in person appointments. I also discussed with the patient that there may be a patient responsible charge related to this service. The patient expressed understanding and agreed to proceed.  Synopsis Pt. Is followed by the screening program. He was found to have a LUL mass ( Lung RADS 4 B indicates suspicious findings for which additional diagnostic testing and or tissue sampling is recommended.). He has had follow up imaging and is being scheduled for ENB for tissue sampling tentatively scheduled 6/29 at 07:30.   History of Present Illness: Pt. Presents for follow up of PFT's done as part of work up for abnormal Screening CT/ PET scan. He is scheduled for ENB 10/13/2019 at 7:30 am with Dr. Valeta Harms for tissue sampling. PFT's are adequate for surgery should that be determined to be best treatment option for Jesse [name].He has been started on a Chantix starter pack to help with smoking cessation.   Pt. States she has been  Doing well. We reviewed his PFT's and discussed that they look good for surgery if that is part of his plan of care. Marland Kitchen He states he swims daily, which he thinks has helped his lung volumes. He denies any chest pain , hemoptysis or orthopnea. He has not been called about his ENB date and time. I have called th office, but Ashley Akin , RN is not in this afternoon. I have messaged her as high priority to call the patient in the morning. I have told the patient he should hear something about the schedule in the morning. He verbalized understanding.     Observations/Objective: 5/19/021>>LDCT Chest Lung-RADS 4B, suspicious. Additional imaging evaluation or consultation with Pulmonology or Thoracic Surgery recommended. 2. Diffuse bronchial wall thickening with emphysema, as above; imaging findings suggestive of underlying COPD. 3. Aortic atherosclerosis, in addition to 3 vessel coronary artery Disease.  09/16/2019 PET Scan Hypermetabolic nodule in the LEFT upper lobe is concerning for primary bronchogenic carcinoma (stage IA). 2. No hypermetabolic mediastinal lymph nodes or distant metastatic disease. 3. Large round peripherally hypermetabolic mass position between the distal rectum, anus and posterior wall the bladder of unclear etiology. 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 Recommend CT or of the pelvis with contrast for further characterization. Mass likely palpable by digital rectal exam.  10/06/2019: Super D Chest CT without Contrast>> For ENB 1.2 x 0.8 x 0.5 cm left upper lobe nodule appears similar to the prior examination. Given the hypermetabolism on the prior PET-CT, this remains concerning for potential primary bronchogenic neoplasm. 2. Mild diffuse bronchial wall thickening with mild centrilobular and paraseptal emphysema; imaging findings suggestive of underlying COPD. 3. Aortic atherosclerosis, in addition to left main and 3 vessel coronary artery disease. Please note that although the presence of coronary artery calcium documents the presence of coronary artery disease, the severity of this disease and any potential stenosis cannot be assessed on this non-gated CT examination. Assessment for potential risk factor modification, dietary therapy or pharmacologic therapy may be warranted, if clinically indicated. 4. There are calcifications of the aortic valve. Echocardiographic  correlation for evaluation of potential valvular  dysfunction may be warranted if clinically indicated. 5. 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 Recommend annual imaging followup by CTA or MRA. This recommendation follows 2010 ACCF/AHA/AATS/ACR/ASA/SCA/SCAI/SIR/STS/SVM Guidelines for the Diagnosis and Management of Patients with Thoracic Aortic Disease. Circulation. 2010; 121: O459-X774. Aortic aneurysm NOS (ICD10-I71.9).  Aortic Atherosclerosis (ICD10-I70.0) and Emphysema (ICD10-J43.9).  Pulmonary Functions Testing Results:        Assessment and Plan: Hypermetabolic LUL pulmonary Nodule concerning for bronchogenic Lung Cancer  PFT's show mild obstruction with good lung volumes Plan Scheduled for ENB for tissue sampling Office to call patient and confirm date and time ( Tentatively scheduled for 6/29 at 7:30 am with Dr. Valeta Harms) Follow up after procedure with CXR prior Please call the office for any questions or concerns, or if you do not hear from Ashley Akin RN in the morning ( 6/24) Please contact office for sooner follow up if symptoms do not improve or worsen or seek emergency care   Tobacco Abuse Started Chantix Started Pack Plan Continue to work on smoking cessation.  Large round peripherally hypermetabolic mass position between the distal rectum, anus and posterior wall the bladder of unclear etiology. 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 Plan CT Abdomen and Pelvis scheduled for 10/08/2019    Follow Up Instructions: ENB is tentatively scheduled for 10/13/2019 at 07:30 am with Dr. Valeta Harms. Will need follow up after procedure in the office with CXR prior.  Consider need for inhaler therapy at follow up.    I discussed the assessment and treatment plan with the patient. The patient was provided an opportunity to ask questions and all were answered. The patient agreed with the plan and demonstrated an  understanding of the instructions.   The patient was advised to call back or seek an in-person evaluation if the symptoms worsen or if the condition fails to improve as anticipated.  I provided 30  minutes of non-face-to-face time during this encounter.   Magdalen Spatz, NP 10/07/2019 3:56 PM

## 2019-10-07 NOTE — Progress Notes (Signed)
Why has this procedure not been scheduled? The referral and with details was placed on 6/17? I also do not see that his superD has been scheduled?  [facility] 10/07/2019 5:37 PM

## 2019-10-07 NOTE — Patient Instructions (Addendum)
It is good to talk with you today Your PFT's look adequate for surgery if that is part of your treatment plan moving forward.  The office will call you with date and time for your procedure. It is tentatively scheduled for 10/13/2019 at 07:30 am If you do not hear from them tomorrow ( 6/24) please call the office and ask for Ashley Akin, RN I have messaged her about this today. Continue to work on smoking cessation, Chantix as you have been prescribed CT Abdomen and Pelvis scheduled 10/08/2019 as follow up for hypermetabolic finding on PET scan. Follow up with PCP after CT.  Call the office is you need anything prior to your procedure. Please contact office for sooner follow up if symptoms do not improve or worsen or seek emergency care

## 2019-10-08 ENCOUNTER — Other Ambulatory Visit: Payer: Self-pay | Admitting: *Deleted

## 2019-10-08 ENCOUNTER — Other Ambulatory Visit (INDEPENDENT_AMBULATORY_CARE_PROVIDER_SITE_OTHER): Payer: Medicare Other

## 2019-10-08 ENCOUNTER — Telehealth: Payer: Self-pay | Admitting: Pulmonary Disease

## 2019-10-08 DIAGNOSIS — R918 Other nonspecific abnormal finding of lung field: Secondary | ICD-10-CM

## 2019-10-08 LAB — COMPREHENSIVE METABOLIC PANEL
ALT: 13 U/L (ref 0–53)
AST: 18 U/L (ref 0–37)
Albumin: 4.4 g/dL (ref 3.5–5.2)
Alkaline Phosphatase: 70 U/L (ref 39–117)
BUN: 21 mg/dL (ref 6–23)
CO2: 28 mEq/L (ref 19–32)
Calcium: 9.8 mg/dL (ref 8.4–10.5)
Chloride: 104 mEq/L (ref 96–112)
Creatinine, Ser: 0.79 mg/dL (ref 0.40–1.50)
GFR: 96.91 mL/min (ref >60.00)
Glucose, Bld: 130 mg/dL — ABNORMAL HIGH (ref 70–99)
Potassium: 4.1 mEq/L (ref 3.5–5.1)
Sodium: 138 mEq/L (ref 135–145)
Total Bilirubin: 0.6 mg/dL (ref 0.2–1.2)
Total Protein: 6.8 g/dL (ref 6.0–8.3)

## 2019-10-08 LAB — CBC WITH DIFFERENTIAL/PLATELET
Basophils Absolute: 0 10*3/uL (ref 0.0–0.1)
Basophils Relative: 0.6 % (ref 0.0–3.0)
Eosinophils Absolute: 0.1 10*3/uL (ref 0.0–0.7)
Eosinophils Relative: 2 % (ref 0.0–5.0)
HCT: 41.4 % (ref 39.0–52.0)
Hemoglobin: 14.2 g/dL (ref 13.0–17.0)
Lymphocytes Relative: 27.6 % (ref 12.0–46.0)
Lymphs Abs: 1.4 10*3/uL (ref 0.7–4.0)
MCHC: 34.2 g/dL (ref 30.0–36.0)
MCV: 97.9 fl (ref 78.0–100.0)
Monocytes Absolute: 0.3 10*3/uL (ref 0.1–1.0)
Monocytes Relative: 5.1 % (ref 3.0–12.0)
Neutro Abs: 3.4 10*3/uL (ref 1.4–7.7)
Neutrophils Relative %: 64.7 % (ref 43.0–77.0)
Platelets: 127 10*3/uL — ABNORMAL LOW (ref 150.0–400.0)
RBC: 4.23 Mil/uL (ref 4.22–5.81)
RDW: 13.6 % (ref 11.5–15.5)
WBC: 5.3 10*3/uL (ref 4.0–10.5)

## 2019-10-08 LAB — APTT: aPTT: 32.4 s (ref 23.4–32.7)

## 2019-10-08 LAB — PROTIME-INR
INR: 0.9 ratio (ref 0.8–1.0)
Prothrombin Time: 10.4 s (ref 9.6–13.1)

## 2019-10-08 NOTE — Progress Notes (Signed)
These results were discussed with the patient. He verbalized understanding. He is scheduled for ENB per Dr. Valeta Harms 6/29/at 7:30 am

## 2019-10-08 NOTE — Telephone Encounter (Signed)
Pt has been scheduled for ENB on 6/29 at 9:15 at [place].  I have called pt & given him appt info.  Super D was done on 6/22 and per CT Lattie Haw T requested disk to be sent over yesterday.

## 2019-10-09 ENCOUNTER — Ambulatory Visit
Admission: RE | Admit: 2019-10-09 | Discharge: 2019-10-09 | Disposition: A | Discharge status: Home / Self Care | Payer: Medicare Other | Source: Ambulatory Visit | Attending: Family Medicine | Admitting: Family Medicine

## 2019-10-09 ENCOUNTER — Telehealth: Payer: Self-pay

## 2019-10-09 ENCOUNTER — Telehealth: Payer: Self-pay | Admitting: Family Medicine

## 2019-10-09 ENCOUNTER — Other Ambulatory Visit: Payer: Self-pay

## 2019-10-09 DIAGNOSIS — R1907 Generalized intra-abdominal and pelvic swelling, mass and lump: Secondary | ICD-10-CM

## 2019-10-09 DIAGNOSIS — K6289 Other specified diseases of anus and rectum: Secondary | ICD-10-CM

## 2019-10-09 DIAGNOSIS — R1900 Intra-abdominal and pelvic swelling, mass and lump, unspecified site: Secondary | ICD-10-CM | POA: Diagnosis not present

## 2019-10-09 MED ORDER — IOPAMIDOL (ISOVUE-300) INJECTION 61%
100.0000 mL | Freq: Once | INTRAVENOUS | Status: AC | PRN
  Administered 2019-10-09: 100 mL via INTRAVENOUS

## 2019-10-09 NOTE — Telephone Encounter (Signed)
Pt contacted in regards to CT abd/pelvis results from today.  Pt out working in his garden.  Pt notes reviewing results via mychart.  Pt advised of a 5.2 cm x 3.9 cm on anterior rectum that does not appear to be coming from the bladder or prostate. 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  Mass was appreciated on DRE at last OFV.  Prostate enlarged 2/2 BPH.      Will place referral to GI, Dr. Carlean Purl for tissue sampling.  Pt currently without questions.   Pt scheduled for video bronch 6/29 for LUL mass noted on screening low dose CT.  Grier Mitts, MD

## 2019-10-09 NOTE — Telephone Encounter (Signed)
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 Message sent to Dr Volanda Napoleon for review/advise

## 2019-10-10 ENCOUNTER — Other Ambulatory Visit (HOSPITAL_COMMUNITY)
Admission: RE | Admit: 2019-10-10 | Discharge: 2019-10-10 | Disposition: A | Discharge status: Home / Self Care | Payer: Medicare Other | Source: Ambulatory Visit | Attending: Pulmonary Disease | Admitting: Pulmonary Disease

## 2019-10-10 DIAGNOSIS — Z01812 Encounter for preprocedural laboratory examination: Secondary | ICD-10-CM | POA: Diagnosis not present

## 2019-10-10 DIAGNOSIS — Z20822 Contact with and (suspected) exposure to covid-19: Secondary | ICD-10-CM | POA: Diagnosis not present

## 2019-10-10 LAB — SARS CORONAVIRUS 2 (TAT 6-24 HRS): SARS Coronavirus 2: NEGATIVE

## 2019-10-12 ENCOUNTER — Other Ambulatory Visit: Payer: Self-pay

## 2019-10-12 ENCOUNTER — Encounter (HOSPITAL_COMMUNITY): Payer: Self-pay | Admitting: Pulmonary Disease

## 2019-10-12 NOTE — Progress Notes (Signed)
Patient denies shortness of breath, fever, cough or chest pain.  PCP - Dr Grier Mitts Cardiologist - n/a Pulmonology - Eric Form, NP  Chest x-ray - n/a, CT chest 10/07/19 EKG - DOS 10/13/19 Stress Test - n/a ECHO - n/a Cardiac Cath - n/a  Anesthesia review: Yes  STOP now taking any Aspirin (unless otherwise instructed by your surgeon), Aleve, Naproxen, Ibuprofen, Motrin, Advil, Goody's, BC's, all herbal medications, fish oil, and all vitamins.   Coronavirus Screening Covid test on 10/10/19 was negative.  Patient verbalized understanding of instructions that were given via phone.

## 2019-10-13 ENCOUNTER — Encounter (HOSPITAL_COMMUNITY): Payer: Self-pay | Admitting: Pulmonary Disease

## 2019-10-13 ENCOUNTER — Ambulatory Visit (HOSPITAL_COMMUNITY): Payer: Medicare Other | Admitting: Physician Assistant

## 2019-10-13 ENCOUNTER — Ambulatory Visit (HOSPITAL_COMMUNITY)
Admission: RE | Admit: 2019-10-13 | Discharge: 2019-10-13 | Disposition: A | Discharge status: Home / Self Care | Payer: Medicare Other | Attending: Pulmonary Disease | Admitting: Pulmonary Disease

## 2019-10-13 ENCOUNTER — Other Ambulatory Visit: Payer: Self-pay

## 2019-10-13 ENCOUNTER — Ambulatory Visit (HOSPITAL_COMMUNITY): Payer: Medicare Other

## 2019-10-13 ENCOUNTER — Encounter (HOSPITAL_COMMUNITY)
Admission: RE | Disposition: A | Discharge status: Home / Self Care | Payer: Self-pay | Source: Home / Self Care | Attending: Pulmonary Disease

## 2019-10-13 DIAGNOSIS — M1990 Unspecified osteoarthritis, unspecified site: Secondary | ICD-10-CM | POA: Insufficient documentation

## 2019-10-13 DIAGNOSIS — Z8551 Personal history of malignant neoplasm of bladder: Secondary | ICD-10-CM | POA: Diagnosis not present

## 2019-10-13 DIAGNOSIS — I10 Essential (primary) hypertension: Secondary | ICD-10-CM | POA: Insufficient documentation

## 2019-10-13 DIAGNOSIS — I2510 Atherosclerotic heart disease of native coronary artery without angina pectoris: Secondary | ICD-10-CM | POA: Insufficient documentation

## 2019-10-13 DIAGNOSIS — R911 Solitary pulmonary nodule: Secondary | ICD-10-CM

## 2019-10-13 DIAGNOSIS — Z9889 Other specified postprocedural states: Secondary | ICD-10-CM

## 2019-10-13 DIAGNOSIS — C3412 Malignant neoplasm of upper lobe, left bronchus or lung: Secondary | ICD-10-CM | POA: Diagnosis not present

## 2019-10-13 DIAGNOSIS — J449 Chronic obstructive pulmonary disease, unspecified: Secondary | ICD-10-CM | POA: Diagnosis not present

## 2019-10-13 DIAGNOSIS — D696 Thrombocytopenia, unspecified: Secondary | ICD-10-CM | POA: Diagnosis not present

## 2019-10-13 DIAGNOSIS — R918 Other nonspecific abnormal finding of lung field: Secondary | ICD-10-CM | POA: Diagnosis not present

## 2019-10-13 DIAGNOSIS — F17200 Nicotine dependence, unspecified, uncomplicated: Secondary | ICD-10-CM | POA: Diagnosis not present

## 2019-10-13 HISTORY — DX: Unspecified macular degeneration: H35.30

## 2019-10-13 HISTORY — DX: Other nonspecific abnormal finding of lung field: R91.8

## 2019-10-13 HISTORY — PX: BRONCHIAL WASHINGS: SHX5105

## 2019-10-13 HISTORY — DX: Other specified diseases of anus and rectum: K62.89

## 2019-10-13 HISTORY — DX: Chronic obstructive pulmonary disease, unspecified: J44.9

## 2019-10-13 HISTORY — PX: BRONCHIAL BIOPSY: SHX5109

## 2019-10-13 HISTORY — PX: BRONCHIAL BRUSHINGS: SHX5108

## 2019-10-13 HISTORY — PX: VIDEO BRONCHOSCOPY WITH ENDOBRONCHIAL NAVIGATION: SHX6175

## 2019-10-13 HISTORY — PX: BRONCHIAL NEEDLE ASPIRATION BIOPSY: SHX5106

## 2019-10-13 SURGERY — VIDEO BRONCHOSCOPY WITH ENDOBRONCHIAL NAVIGATION
Anesthesia: General

## 2019-10-13 MED ORDER — LACTATED RINGERS IV SOLN: INTRAVENOUS | Status: DC | PRN

## 2019-10-13 MED ORDER — LACTATED RINGERS IV SOLN: INTRAVENOUS | Status: DC

## 2019-10-13 MED ORDER — SUGAMMADEX SODIUM 200 MG/2ML IV SOLN
INTRAVENOUS | Status: DC | PRN
Start: 2019-10-13 — End: 2019-10-13
  Administered 2019-10-13: 200 mg via INTRAVENOUS

## 2019-10-13 MED ORDER — ROCURONIUM BROMIDE 10 MG/ML (PF) SYRINGE
PREFILLED_SYRINGE | INTRAVENOUS | Status: DC | PRN
  Administered 2019-10-13: 50 mg via INTRAVENOUS

## 2019-10-13 MED ORDER — ONDANSETRON HCL 4 MG/2ML IJ SOLN: 4.0000 mg | Freq: Once | INTRAMUSCULAR | Status: DC | PRN

## 2019-10-13 MED ORDER — DEXAMETHASONE SODIUM PHOSPHATE 10 MG/ML IJ SOLN
INTRAMUSCULAR | Status: DC | PRN
Start: 2019-10-13 — End: 2019-10-13
  Administered 2019-10-13: 5 mg via INTRAVENOUS

## 2019-10-13 MED ORDER — PHENYLEPHRINE HCL-NACL 10-0.9 MG/250ML-% IV SOLN
INTRAVENOUS | Status: DC | PRN
  Administered 2019-10-13: 50 ug/min via INTRAVENOUS

## 2019-10-13 MED ORDER — FENTANYL CITRATE (PF) 100 MCG/2ML IJ SOLN: 25.0000 ug | INTRAMUSCULAR | Status: DC | PRN

## 2019-10-13 MED ORDER — PROPOFOL 10 MG/ML IV BOLUS
INTRAVENOUS | Status: DC | PRN
  Administered 2019-10-13: 150 mg via INTRAVENOUS

## 2019-10-13 MED ORDER — EPHEDRINE SULFATE 50 MG/ML IJ SOLN
INTRAMUSCULAR | Status: DC | PRN
Start: 2019-10-13 — End: 2019-10-13
  Administered 2019-10-13: 10 mg via INTRAVENOUS

## 2019-10-13 MED ORDER — LIDOCAINE 2% (20 MG/ML) 5 ML SYRINGE
INTRAMUSCULAR | Status: DC | PRN
  Administered 2019-10-13: 60 mg via INTRAVENOUS

## 2019-10-13 MED ORDER — FENTANYL CITRATE (PF) 100 MCG/2ML IJ SOLN
INTRAMUSCULAR | Status: DC | PRN
  Administered 2019-10-13: 50 ug via INTRAVENOUS

## 2019-10-13 SURGICAL SUPPLY — 46 items
ADAPTER BRONCH F/PENTAX (ADAPTER) ×4 IMPLANT
ADAPTER VALVE BIOPSY EBUS (MISCELLANEOUS) IMPLANT
ADPR BSCP EDG PNTX (ADAPTER) ×1
ADPTR VALVE BIOPSY EBUS (MISCELLANEOUS)
BRUSH CYTOL CELLEBRITY 1.5X140 (MISCELLANEOUS) ×4 IMPLANT
BRUSH SUPERTRAX BIOPSY (INSTRUMENTS) IMPLANT
BRUSH SUPERTRAX NDL-TIP CYTO (INSTRUMENTS) ×4 IMPLANT
CANISTER SUCT 3000ML PPV (MISCELLANEOUS) ×4 IMPLANT
CHANNEL WORK EXTEND EDGE 180 (KITS) IMPLANT
CHANNEL WORK EXTEND EDGE 45 (KITS) IMPLANT
CHANNEL WORK EXTEND EDGE 90 (KITS) IMPLANT
CONT SPEC 4OZ CLIKSEAL STRL BL (MISCELLANEOUS) ×4 IMPLANT
COVER BACK TABLE 60X90IN (DRAPES) ×4 IMPLANT
FILTER STRAW FLUID ASPIR (MISCELLANEOUS) IMPLANT
FORCEPS BIOP SUPERTRX PREMAR (INSTRUMENTS) ×4 IMPLANT
GAUZE SPONGE 4X4 12PLY STRL (GAUZE/BANDAGES/DRESSINGS) ×4 IMPLANT
GLOVE SURG SS PI 7.5 STRL IVOR (GLOVE) ×8 IMPLANT
GOWN STRL REUS W/ TWL LRG LVL3 (GOWN DISPOSABLE) ×4 IMPLANT
GOWN STRL REUS W/TWL LRG LVL3 (GOWN DISPOSABLE) ×8
KIT CLEAN ENDO COMPLIANCE (KITS) ×4 IMPLANT
KIT LOCATABLE GUIDE (CANNULA) IMPLANT
KIT MARKER FIDUCIAL DELIVERY (KITS) IMPLANT
KIT PROCEDURE EDGE 180 (KITS) IMPLANT
KIT PROCEDURE EDGE 45 (KITS) IMPLANT
KIT PROCEDURE EDGE 90 (KITS) IMPLANT
KIT TURNOVER KIT B (KITS) ×4 IMPLANT
MARKER SKIN DUAL TIP RULER LAB (MISCELLANEOUS) ×4 IMPLANT
NEEDLE SUPERTRX PREMARK BIOPSY (NEEDLE) ×4 IMPLANT
NS IRRIG 1000ML POUR BTL (IV SOLUTION) ×4 IMPLANT
OIL SILICONE PENTAX (PARTS (SERVICE/REPAIRS)) ×4 IMPLANT
PAD ARMBOARD 7.5X6 YLW CONV (MISCELLANEOUS) ×8 IMPLANT
PATCHES PATIENT (LABEL) ×12 IMPLANT
SOL ANTI FOG 6CC (MISCELLANEOUS) ×2 IMPLANT
SOLUTION ANTI FOG 6CC (MISCELLANEOUS) ×2
SYR 20CC LL (SYRINGE) ×4 IMPLANT
SYR 20ML ECCENTRIC (SYRINGE) ×4 IMPLANT
SYR 50ML SLIP (SYRINGE) ×4 IMPLANT
TOWEL OR 17X24 6PK STRL BLUE (TOWEL DISPOSABLE) ×4 IMPLANT
TRAP SPECIMEN MUCOUS 40CC (MISCELLANEOUS) IMPLANT
TUBE CONNECTING 20'X1/4 (TUBING) ×1
TUBE CONNECTING 20X1/4 (TUBING) ×3 IMPLANT
UNDERPAD 30X30 (UNDERPADS AND DIAPERS) ×4 IMPLANT
VALVE BIOPSY  SINGLE USE (MISCELLANEOUS) ×4
VALVE BIOPSY SINGLE USE (MISCELLANEOUS) ×2 IMPLANT
VALVE SUCTION BRONCHIO DISP (MISCELLANEOUS) ×4 IMPLANT
WATER STERILE IRR 1000ML POUR (IV SOLUTION) ×4 IMPLANT

## 2019-10-13 NOTE — Anesthesia Preprocedure Evaluation (Addendum)
Anesthesia Evaluation  Patient identified by MRN, date of birth, ID band Patient awake    Reviewed: Allergy & Precautions, NPO status , Patient's Chart, lab work & pertinent test results  History of Anesthesia Complications Negative for: history of anesthetic complications  Airway Mallampati: I  TM Distance: >3 FB Neck ROM: Full    Dental  (+) Dental Advisory Given, Missing   Pulmonary COPD, Current Smoker and Patient abstained from smoking.,  LUL mass   Pulmonary exam normal breath sounds clear to auscultation       Cardiovascular hypertension, Pt. on medications + CAD  Normal cardiovascular exam Rhythm:Regular Rate:Normal     Neuro/Psych negative neurological ROS  negative psych ROS   GI/Hepatic Neg liver ROS, GERD  Medicated and Controlled,  Endo/Other  negative endocrine ROS  Renal/GU negative Renal ROS   Bladder cancer     Musculoskeletal  (+) Arthritis ,   Abdominal   Peds  Hematology  (+) Blood dyscrasia (Thrombocytopenia), ,   Anesthesia Other Findings Day of surgery medications reviewed with the patient.  Reproductive/Obstetrics                            Anesthesia Physical Anesthesia Plan  ASA: III  Anesthesia Plan: MAC   Post-op Pain Management:    Induction: Intravenous  PONV Risk Score and Plan: 0 and Propofol infusion and Treatment may vary due to age or medical condition  Airway Management Planned: Nasal Cannula and Natural Airway  Additional Equipment:   Intra-op Plan:   Post-operative Plan:   Informed Consent: I have reviewed the patients History and Physical, chart, labs and discussed the procedure including the risks, benefits and alternatives for the proposed anesthesia with the patient or authorized representative who has indicated his/her understanding and acceptance.     Dental advisory given  Plan Discussed with: CRNA and  Anesthesiologist  Anesthesia Plan Comments:         Anesthesia Quick Evaluation

## 2019-10-13 NOTE — Anesthesia Postprocedure Evaluation (Signed)
Anesthesia Post Note  Patient: Jesse [name]  Procedure(s) Performed: VIDEO BRONCHOSCOPY WITH ENDOBRONCHIAL NAVIGATION (N/A ) BRONCHIAL BRUSHINGS BRONCHIAL NEEDLE ASPIRATION BIOPSIES BRONCHIAL BIOPSIES BRONCHIAL WASHINGS     Patient location during evaluation: PACU Anesthesia Type: General Level of consciousness: awake and alert Pain management: pain level controlled Vital Signs Assessment: post-procedure vital signs reviewed and stable Respiratory status: spontaneous breathing, nonlabored ventilation, respiratory function stable and patient connected to nasal cannula oxygen Cardiovascular status: blood pressure returned to baseline and stable Postop Assessment: no apparent nausea or vomiting Anesthetic complications: no   No complications documented.  Last Vitals:  Vitals:   10/13/19 1119 10/13/19 1130  BP: (!) 143/76 (!) 145/74  Pulse: 63 67  Resp: 17 19  Temp:  36.6 C  SpO2: 96% 96%    Last Pain:  Vitals:   10/13/19 1130  TempSrc:   PainSc: 0-No pain                 Catalina Gravel

## 2019-10-13 NOTE — Transfer of Care (Signed)
Immediate Anesthesia Transfer of Care Note  Patient: Jesse [name]  Procedure(s) Performed: VIDEO BRONCHOSCOPY WITH ENDOBRONCHIAL NAVIGATION (N/A ) BRONCHIAL BRUSHINGS BRONCHIAL NEEDLE ASPIRATION BIOPSIES BRONCHIAL BIOPSIES BRONCHIAL WASHINGS  Patient Location: PACU  Anesthesia Type:General  Level of Consciousness: awake, alert  and oriented  Airway & Oxygen Therapy: Patient Spontanous Breathing and Patient connected to nasal cannula oxygen  Post-op Assessment: Report given to RN, Post -op Vital signs reviewed and stable and Patient moving all extremities X 4  Post vital signs: Reviewed and stable  Last Vitals:  Vitals Value Taken Time  BP    Temp    Pulse    Resp    SpO2      Last Pain:  Vitals:   10/13/19 0742  TempSrc: Temporal  PainSc:          Complications: No complications documented.

## 2019-10-13 NOTE — Op Note (Addendum)
Navigational Bronchoscopy Procedure Note  Jesse [name]  [id number]  1951/01/11  Date:10/13/19  Time:11:26 AM   Provider Performing:Vivianne Carles L Chevis Weisensel   Procedure: Video Bronchoscopy with Electromagnetic Navigation - Guided Biopsies  Indication(s) Left upper lobe lung nodule  Consent Risks of the procedure as well as the alternatives and risks of each were explained to the patient and/or caregiver.  Consent for the procedure was obtained.  Anesthesia General Anesthesia  Time Out Verified patient identification, verified procedure, site/side was marked, verified correct patient position, special equipment/implants available, medications/allergies/relevant history reviewed, required imaging and test results available.  Sterile Technique Usual hand hygiene, masks, gowns, and gloves were used  Procedure Description Prior to the date of the procedure a high-resolution CT scan of the chest was performed. Utilizing super Dimension software a virtual tracheobronchial tree was generated to allow the creation of distinct navigation pathways to the patient's parenchymal abnormalities. After being taken to the endoscopy room general anesthesia was initiated and the patient was orally intubated. The video fiberoptic bronchoscope was introduced via the endotracheal tube and a general inspection was performed which showed normal right and left lung anatomy, evidence of bronchiectatic openings, distal airway pitting, no endobronchial lesion identified.  Prior to the start of the navigational procedure brushings of the anterior wall of the right mainstem and posterior wall of the right mainstem were completed for genetic analysis, Percepta. The extendable working channel and locator guide were introduced into the bronchoscope.  Full fluoroscopic sweep for local registration was completed to include imaging RAO 25 degrees to LAO 25 degrees during a inspiratory breath-hold APL 25 cm water. The distinct  navigation pathways prepared prior to this procedure were then utilized to navigate to within 0.8 cm of patient's lesion(s) identified on CT scan. The extendable working channel was secured into place and the locator guide was withdrawn. Under fluoroscopic guidance transbronchial needle brushings, transbronchial Wang needle biopsies, and transbronchial forceps biopsies were performed to be sent for cytology and pathology. A bronchioalveolar lavage was performed in the left upper lobe and sent for cytology. At the end of the procedure a general airway inspection was performed and there was no evidence of active bleeding. The bronchoscope was removed.   Complications/Tolerance None; patient tolerated the procedure well. Chest X-ray is ordered to confirm no post-procedural complication.  EBL Minimal, <1cc  Specimen(s) 1. Transbronchial needle brushings from left upper lobe 2. Transbronchial Wang needle biopsies from left upper lobe 3. Transbronchial forceps biopsies from left upper lobe 4. Bronchoalveolar lavage from left upper lobe  Garner Nash, DO [facility] 10/13/2019 11:29 AM

## 2019-10-13 NOTE — Discharge Instructions (Signed)
Pulmonary Nodule A pulmonary nodule is a small, round growth of tissue in the lung. It is sometimes referred to as a "shadow" or "spot on the lung." Nodules range in size from less than 1/5 of an inch (4 mm) to a little bigger than an inch (30 mm). Pulmonary nodules can be either noncancerous (benign) or cancerous (malignant). Most are noncancerous. Smaller nodules in people who do not smoke and do not have any other risk factors for lung cancer are more likely to be noncancerous. Larger, irregular nodules in people who smoke or who have a strong family history of lung cancer are more likely to be cancerous. What are the causes? This condition may be caused by:  A bacterial, fungal, or viral infection, such as tuberculosis. The infection is usually an old and inactive one.  A noncancerous mass of tissue.  Inflammation from conditions such as rheumatoid arthritis.  Abnormal blood vessels in the lungs.  Cancerous tissue, such as lung cancer or a cancer in another part of the body that has spread to the lung. What are the signs or symptoms? This condition usually does not cause symptoms. If symptoms appear, they are usually related to the underlying cause. For example, if the condition is caused by an infection, you may have a cough or fever. How is this diagnosed? This condition is usually diagnosed with an X-ray or CT scan. To help determine whether a pulmonary nodule is benign or malignant, your health care provider will:  Take your medical history.  Perform a physical exam.  Order tests, including: ? Blood tests. ? A skin test called a tuberculin test. This test is done to check if you have been exposed to the germ that causes tuberculosis. ? Chest X-rays. ? A CT scan. This test shows smaller pulmonary nodules more clearly and with more detail than an X-ray. ? A positron emission tomography (PET) scan. This test is done to check if the nodule is cancerous. During the test, a safe amount  of a radioactive substance is injected into the bloodstream. Then a picture is taken. ? Biopsy. In this test, a tiny piece of the pulmonary nodule is removed and then examined under a microscope. How is this treated? Treatment for this condition depends on whether the pulmonary nodule is malignant or benign as well as your risk of getting cancer.  Noncancerous nodules usually do not need to be treated, but they may need to be monitored with CT scans. If a CT scan shows that the pulmonary nodule got bigger, more tests may be done.  Some nodules need to be removed. If this is the case, you may have a procedure called a thoractomy. During the procedure, your health care provider will make an incision in your chest and remove the part of the lung where the nodule is located. Follow these instructions at home:   Take over-the-counter and prescription medicines only as told by your health care provider.  Do not use any products that contain nicotine or tobacco, such as cigarettes and e-cigarettes. If you need help quitting, ask your health care provider.  Keep all follow-up visits as told by your health care provider. This is important. Contact a health care provider if:  You have trouble breathing when you are active.  You feel sick or unusually tired.  You do not feel like eating.  You lose weight without trying.  You develop chills or night sweats. Get help right away if:  You cannot catch your breath.  You begin wheezing.  You cannot stop coughing.  You cough up blood.  You become dizzy or feel like you are going to faint.  You have sudden chest pain.  You have a fever or persistent symptoms for more than 2-3 days.  You have a fever and your symptoms suddenly get worse. Summary  A pulmonary nodule is a small, round growth of tissue in the lung. Most pulmonary nodules are noncancerous.  This condition is usually diagnosed with an X-ray or CT scan.  Common causes of  pulmonary nodules include infection, inflammation, and noncancerous growths.  Though less common, if a nodule is found to be cancerous, you will need specific diagnostic tests and treatment options as directed by your medical provider.  Treatment for this condition depends on whether the pulmonary nodule is benign or malignant as well as your risk of getting cancer. This information is not intended to replace advice given to you by your health care provider. Make sure you discuss any questions you have with your health care provider. Document Revised: 04/26/2017 Document Reviewed: 05/01/2016 Elsevier Patient Education  [place].    Flexible Bronchoscopy, Care After This sheet gives you information about how to care for yourself after your test. Your doctor may also give you more specific instructions. If you have problems or questions, contact your doctor. Follow these instructions at home: Eating and drinking:  The day after the test, go back to your normal diet. Driving  Do not drive for 24 hours if you were given a medicine to help you relax (sedative).  Do not drive or use heavy machinery while taking prescription pain medicine. General instructions   Take over-the-counter and prescription medicines only as told by your doctor.  Return to your normal activities as told. Ask what activities are safe for you.  Do not use any products that have nicotine or tobacco in them. This includes cigarettes and e-cigarettes. If you need help quitting, ask your doctor.  Keep all follow-up visits as told by your doctor. This is important. It is very important if you had a tissue sample (biopsy) taken. Get help right away if:  You have shortness of breath that gets worse.  You get light-headed.  You feel like you are going to pass out (faint).  You have chest pain.  You cough up: ? More than a little blood. ? More blood than before. Summary  Do not eat or drink anything  (not even water) for 2 hours after your test, or until your numbing medicine wears off.  Do not use cigarettes. Do not use e-cigarettes.  Get help right away if you have chest pain. This information is not intended to replace advice given to you by your health care provider. Make sure you discuss any questions you have with your health care provider. Document Revised: 03/15/2017 Document Reviewed: 04/20/2016 Elsevier Patient Education  2020 [employer].

## 2019-10-13 NOTE — Anesthesia Procedure Notes (Signed)
Procedure Name: Intubation Date/Time: 10/13/2019 10:00 AM Performed by: Neldon Newport, CRNA Pre-anesthesia Checklist: Timeout performed, Patient being monitored, Suction available, Emergency Drugs available and Patient identified Patient Re-evaluated:Patient Re-evaluated prior to induction Oxygen Delivery Method: Circle system utilized Preoxygenation: Pre-oxygenation with 100% oxygen Induction Type: IV induction Ventilation: Mask ventilation without difficulty Laryngoscope Size: Mac and 4 Grade View: Grade II Tube type: Oral Tube size: 8.5 mm Number of attempts: 1 Placement Confirmation: ETT inserted through vocal cords under direct vision,  positive ETCO2 and breath sounds checked- equal and bilateral Secured at: 23 cm Tube secured with: Tape Dental Injury: Teeth and Oropharynx as per pre-operative assessment

## 2019-10-13 NOTE — Interval H&P Note (Signed)
History and Physical Interval Note:  10/13/2019 7:33 AM  Jesse [name]  has presented today for surgery, with the diagnosis of lung mass.  The various methods of treatment have been discussed with the patient and family. After consideration of risks, benefits and other options for treatment, the patient has consented to  Procedure(s): [place] (N/A) as a surgical intervention.  The patient's history has been reviewed, patient examined, no change in status, stable for surgery.  I have reviewed the patient's chart and labs.  Questions were answered to the patient's satisfaction.    Patient seen and examined in pre-op. All questions answered. Discussed risks, benefits and alternatives. Patient is agreeable to proceed. We discussed the risks of bleeding, pneumothorax and death.   [place]

## 2019-10-14 ENCOUNTER — Other Ambulatory Visit: Payer: Self-pay

## 2019-10-14 ENCOUNTER — Encounter (HOSPITAL_COMMUNITY): Payer: Self-pay | Admitting: Pulmonary Disease

## 2019-10-14 LAB — CYTOLOGY - NON PAP

## 2019-10-15 ENCOUNTER — Telehealth: Payer: Self-pay | Admitting: Pulmonary Disease

## 2019-10-15 DIAGNOSIS — K6289 Other specified diseases of anus and rectum: Secondary | ICD-10-CM | POA: Diagnosis not present

## 2019-10-15 DIAGNOSIS — K6389 Other specified diseases of intestine: Secondary | ICD-10-CM

## 2019-10-15 LAB — SURGICAL PATHOLOGY

## 2019-10-15 LAB — CYTOLOGY - NON PAP

## 2019-10-15 NOTE — Telephone Encounter (Signed)
I know he had a CT Abdomen done. Shows a mass originating  from the anus versus anterior wall of the rectum. [facility] ANAL CARCINOMA>> he has been referred for Dr. Carlean Purl for tissue sampling. No evidence of metastatic disease in the abdomen or pelvis, no lymphadenopathy.  I guess we wil have to see which takes priority to be managed first.

## 2019-10-15 NOTE — Telephone Encounter (Signed)
PCCM:  I attempted to call the patient. There was no answer. I will try to reach out again to review his pathology.   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 So it looks like he has two processes going on. His lung function is good and likely a surgical candidate for the lingular nodule.   I have [place] Dr. Kipp Brood so he could maybe meet and discuss with him regarding resection. I am guess he will still need work up for the abnormalities within the pelvis.   Thanks  Garner Nash, DO [facility] 10/15/2019 6:01 PM

## 2019-10-16 ENCOUNTER — Encounter: Payer: Self-pay | Admitting: Internal Medicine

## 2019-10-16 NOTE — Addendum Note (Signed)
Addended by: Marlon Pel on: 10/16/2019 09:28 AM   Modules accepted: Orders

## 2019-10-16 NOTE — Telephone Encounter (Signed)
Patient notified of the recommendations. He is scheduled for flex 10/21/19.  He will come today and pick up his instructions and sign paperwork.

## 2019-10-16 NOTE — Telephone Encounter (Signed)
Thanks for the update Barbaraann Rondo - let's set up a flex sig for next week

## 2019-10-21 ENCOUNTER — Other Ambulatory Visit: Payer: Self-pay

## 2019-10-21 ENCOUNTER — Encounter: Payer: Self-pay | Admitting: Internal Medicine

## 2019-10-21 ENCOUNTER — Telehealth: Payer: Self-pay | Admitting: Pulmonary Disease

## 2019-10-21 ENCOUNTER — Ambulatory Visit (AMBULATORY_SURGERY_CENTER): Payer: Medicare Other | Admitting: Internal Medicine

## 2019-10-21 VITALS — BP 134/112 | HR 98 | Temp 98.2°F | Resp 16 | Ht 69.0 in | Wt 172.0 lb

## 2019-10-21 DIAGNOSIS — D375 Neoplasm of uncertain behavior of rectum: Secondary | ICD-10-CM | POA: Diagnosis not present

## 2019-10-21 DIAGNOSIS — K6289 Other specified diseases of anus and rectum: Secondary | ICD-10-CM | POA: Diagnosis not present

## 2019-10-21 DIAGNOSIS — R933 Abnormal findings on diagnostic imaging of other parts of digestive tract: Secondary | ICD-10-CM

## 2019-10-21 DIAGNOSIS — R911 Solitary pulmonary nodule: Secondary | ICD-10-CM

## 2019-10-21 MED ORDER — SODIUM CHLORIDE 0.9 % IV SOLN
500.0000 mL | Freq: Once | INTRAVENOUS | Status: DC
Start: 2019-10-21 — End: 2019-10-21

## 2019-10-21 NOTE — Patient Instructions (Addendum)
As you saw there is something pressing on the rectum.  This needs an ultrasound-guided biopsy.  I think seeing urology makes sense next - I believe they could coordinate an ultrasound-guided biopsy of this lesion that I think is related to the genitourinary tract.   I wish the best for you in your upcoming evaluation and treatment.   I appreciate the opportunity to care for you. Gatha Mayer, MD, FACG    YOU HAD AN ENDOSCOPIC PROCEDURE TODAY AT [facility]:   Refer to the procedure report that was given to you for any specific questions about what was found during the examination.  If the procedure report does not answer your questions, please call your gastroenterologist to clarify.  If you requested that your care partner not be given the details of your procedure findings, then the procedure report has been included in a sealed envelope for you to review at your convenience later.   Resume medications.  YOU SHOULD EXPECT: Some feelings of bloating in the abdomen. Passage of more gas than usual.  Walking can help get rid of the air that was put into your GI tract during the procedure and reduce the bloating. If you had a lower endoscopy (such as a colonoscopy or flexible sigmoidoscopy) you may notice spotting of blood in your stool or on the toilet paper. If you underwent a bowel prep for your procedure, you may not have a normal bowel movement for a few days.  Please Note:  You might notice some irritation and congestion in your nose or some drainage.  This is from the oxygen used during your procedure.  There is no need for concern and it should clear up in a day or so.  SYMPTOMS TO REPORT IMMEDIATELY:   Following lower endoscopy (colonoscopy or flexible sigmoidoscopy):  Excessive amounts of blood in the stool  Significant tenderness or worsening of abdominal pains  Swelling of the abdomen that is new, acute  Fever of 100F or higher    For urgent or  emergent issues, a gastroenterologist can be reached at any hour by calling [phone]. Do not use MyChart messaging for urgent concerns.    DIET:  We do recommend a small meal at first, but then you may proceed to your regular diet.  Drink plenty of fluids but you should avoid alcoholic beverages for 24 hours.  ACTIVITY:  You should plan to take it easy for the rest of today and you should NOT DRIVE or use heavy machinery until tomorrow (because of the sedation medicines used during the test).    FOLLOW UP: Our staff will call the number listed on your records 48-72 hours following your procedure to check on you and address any questions or concerns that you may have regarding the information given to you following your procedure. If we do not reach you, we will leave a message.  We will attempt to reach you two times.  During this call, we will ask if you have developed any symptoms of COVID 19. If you develop any symptoms (ie: fever, flu-like symptoms, shortness of breath, cough etc.) before then, please call [phone].  If you test positive for Covid 19 in the 2 weeks post procedure, please call and report this information to [place].    If any biopsies were taken you will be contacted by phone or by letter within the next 1-3 weeks.  Please call us at [phone] if you have not heard about the  biopsies in 3 weeks.    SIGNATURES/CONFIDENTIALITY: You and/or your care partner have signed paperwork which will be entered into your electronic medical record.  These signatures attest to the fact that that the information above on your After Visit Summary has been reviewed and is understood.  Full responsibility of the confidentiality of this discharge information lies with you and/or your care-partner.

## 2019-10-21 NOTE — Progress Notes (Signed)
Report given to PACU, vss 

## 2019-10-21 NOTE — Telephone Encounter (Signed)
PCCM:  Referral placed to [facility] for RATs eval on lingular lung nodule.  Referral placed to urology for submucosal rectal mass.  Recent scope by Dr. Carlean Purl recommending urology referral.   Thanks  BLI   CC: Hebert Soho, Irena Cords, DO [facility] 10/21/2019 3:00 PM

## 2019-10-21 NOTE — Progress Notes (Signed)
1435 arrived to recovery alert and verbally responsive, oriented x3 abdomen  Soft,denies pain,no sedation received for procedure. Pt. Ambulated out of facility with spouse. Pt. Given emergency contact number with discharge instructions.

## 2019-10-21 NOTE — Progress Notes (Signed)
Pt's states no medical or surgical changes since previsit or office visit. Left upper for arm beneath anticubical large purple area noted.

## 2019-10-21 NOTE — Op Note (Signed)
[facility] Patient Name: Jesse [name] Procedure Date: 10/21/2019 2:20 PM MRN: [id number] Endoscopist: Gatha Mayer , MD Age: 69 Referring MD:  Date of Birth: 17-Sep-1950 Gender: Male Account #: [id number] Procedure:                Flexible Sigmoidoscopy Indications:              Abnormal CT of the GI tract Medicines:                None Procedure:                Pre-Anesthesia Assessment:                           - Prior to the procedure, a History and Physical                            was performed, and patient medications and                            allergies were reviewed. The patient's tolerance of                            previous anesthesia was also reviewed. The risks                            and benefits of the procedure and the sedation                            options and risks were discussed with the patient.                            All questions were answered, and informed consent                            was obtained. Prior Anticoagulants: The patient has                            taken no previous anticoagulant or antiplatelet                            agents. ASA Grade Assessment: III - A patient with                            severe systemic disease. After reviewing the risks                            and benefits, the patient was deemed in                            satisfactory condition to undergo the procedure.                           After obtaining informed consent, the scope was  passed under direct vision. The Endoscope was                            introduced through the anus and advanced to the the                            rectosigmoid junction. The flexible sigmoidoscopy                            was accomplished without difficulty. The patient                            tolerated the procedure well. The quality of the                            bowel preparation was excellent. Scope In:  2:31:39 PM Scope Out: 2:33:27 PM Total Procedure Duration: 0 hours 1 minute 48 seconds  Findings:                 The digital rectal exam revealed a firm and smooth                            rectal mass. The mass was non-circumferential and                            located predominantly at the anterior bowel wall.                           A submucosal large mass was found in the rectum.                            The mass was non-circumferential.                           The sigmoid colon appeared normal. Complications:            No immediate complications. Estimated Blood Loss:     Estimated blood loss: none. Impression:               - Rectal mass.                           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                           - The sigmoid colon is normal.                           - No specimens collected. Recommendation:           - The patient will be observed post-procedure,                            until all discharge criteria are met.                           - This  mass will require an ultrasound-guided                            FNA/biopsy I think - I recommend urology evaluation                            next. Gatha Mayer, MD 10/21/2019 2:45:54 PM This report has been signed electronically.

## 2019-10-22 ENCOUNTER — Telehealth: Payer: Self-pay

## 2019-10-22 ENCOUNTER — Encounter: Payer: Self-pay | Admitting: Family Medicine

## 2019-10-22 ENCOUNTER — Ambulatory Visit (INDEPENDENT_AMBULATORY_CARE_PROVIDER_SITE_OTHER): Payer: Medicare Other | Admitting: Family Medicine

## 2019-10-22 ENCOUNTER — Other Ambulatory Visit: Payer: Self-pay | Admitting: *Deleted

## 2019-10-22 VITALS — BP 120/70 | HR 70 | Temp 97.9°F | Ht 69.0 in | Wt 170.1 lb

## 2019-10-22 DIAGNOSIS — K6289 Other specified diseases of anus and rectum: Secondary | ICD-10-CM

## 2019-10-22 DIAGNOSIS — I700 Atherosclerosis of aorta: Secondary | ICD-10-CM

## 2019-10-22 DIAGNOSIS — F17210 Nicotine dependence, cigarettes, uncomplicated: Secondary | ICD-10-CM | POA: Diagnosis not present

## 2019-10-22 DIAGNOSIS — R911 Solitary pulmonary nodule: Secondary | ICD-10-CM

## 2019-10-22 DIAGNOSIS — K219 Gastro-esophageal reflux disease without esophagitis: Secondary | ICD-10-CM

## 2019-10-22 DIAGNOSIS — I10 Essential (primary) hypertension: Secondary | ICD-10-CM | POA: Diagnosis not present

## 2019-10-22 DIAGNOSIS — H6123 Impacted cerumen, bilateral: Secondary | ICD-10-CM | POA: Diagnosis not present

## 2019-10-22 MED ORDER — OMEPRAZOLE 40 MG PO CPDR: 40.0000 mg | DELAYED_RELEASE_CAPSULE | Freq: Every day | ORAL | 3 refills | Status: DC

## 2019-10-22 MED ORDER — ATORVASTATIN CALCIUM 40 MG PO TABS: ORAL_TABLET | ORAL | 3 refills | Status: DC

## 2019-10-22 MED ORDER — NICOTINE POLACRILEX 4 MG MT GUM: 4.0000 mg | CHEWING_GUM | OROMUCOSAL | 1 refills | Status: DC | PRN

## 2019-10-22 MED ORDER — AMLODIPINE BESYLATE 5 MG PO TABS: 5.0000 mg | ORAL_TABLET | Freq: Every day | ORAL | 3 refills | Status: DC

## 2019-10-22 NOTE — Patient Instructions (Addendum)
No biopsy results noted yet.  A referral was placed yesterday for you to see the urologist for further evaluation of the mass including ultrasound-guided biopsy.  You should receive a call about this appointment.  An order was placed for you to have your cholesterol checked, but you can do this at one of your next follow-up appointments when they draw labs to recheck other levels.  Health Maintenance After Age 70 After age 31, you are at a higher risk for certain long-term diseases and infections as well as injuries from falls. Falls are a major cause of broken bones and head injuries in people who are older than age 78. Getting regular preventive care can help to keep you healthy and well. Preventive care includes getting regular testing and making lifestyle changes as recommended by your health care provider. Talk with your health care provider about:  Which screenings and tests you should have. A screening is a test that checks for a disease when you have no symptoms.  A diet and exercise plan that is right for you. What should I know about screenings and tests to prevent falls? Screening and testing are the best ways to find a health problem early. Early diagnosis and treatment give you the best chance of managing medical conditions that are common after age 81. Certain conditions and lifestyle choices may make you more likely to have a fall. Your health care provider may recommend:  Regular vision checks. Poor vision and conditions such as cataracts can make you more likely to have a fall. If you wear glasses, make sure to get your prescription updated if your vision changes.  Medicine review. Work with your health care provider to regularly review all of the medicines you are taking, including over-the-counter medicines. Ask your health care provider about any side effects that may make you more likely to have a fall. Tell your health care provider if any medicines that you take make you feel  dizzy or sleepy.  Osteoporosis screening. Osteoporosis is a condition that causes the bones to get weaker. This can make the bones weak and cause them to break more easily.  Blood pressure screening. Blood pressure changes and medicines to control blood pressure can make you feel dizzy.  Strength and balance checks. Your health care provider may recommend certain tests to check your strength and balance while standing, walking, or changing positions.  Foot health exam. Foot pain and numbness, as well as not wearing proper footwear, can make you more likely to have a fall.  Depression screening. You may be more likely to have a fall if you have a fear of falling, feel emotionally low, or feel unable to do activities that you used to do.  Alcohol use screening. Using too much alcohol can affect your balance and may make you more likely to have a fall. What actions can I take to lower my risk of falls? General instructions  Talk with your health care provider about your risks for falling. Tell your health care provider if: ? You fall. Be sure to tell your health care provider about all falls, even ones that seem minor. ? You feel dizzy, sleepy, or off-balance.  Take over-the-counter and prescription medicines only as told by your health care provider. These include any supplements.  Eat a healthy diet and maintain a healthy weight. A healthy diet includes low-fat dairy products, low-fat (lean) meats, and fiber from whole grains, beans, and lots of fruits and vegetables. Home safety  Remove  any tripping hazards, such as rugs, cords, and clutter.  Install safety equipment such as grab bars in bathrooms and safety rails on stairs.  Keep rooms and walkways well-lit. Activity   Follow a regular exercise program to stay fit. This will help you maintain your balance. Ask your health care provider what types of exercise are appropriate for you.  If you need a cane or walker, use it as  recommended by your health care provider.  Wear supportive shoes that have nonskid soles. Lifestyle  Do not drink alcohol if your health care provider tells you not to drink.  If you drink alcohol, limit how much you have: ? 0-1 drink a day for women. ? 0-2 drinks a day for men.  Be aware of how much alcohol is in your drink. In the [place] one drink equals one typical bottle of beer (12 oz), one-half glass of wine (5 oz), or one shot of hard liquor (1 oz).  Do not use any products that contain nicotine or tobacco, such as cigarettes and e-cigarettes. If you need help quitting, ask your health care provider. Summary  Having a healthy lifestyle and getting preventive care can help to protect your health and wellness after age 63.  Screening and testing are the best way to find a health problem early and help you avoid having a fall. Early diagnosis and treatment give you the best chance for managing medical conditions that are more common for people who are older than age 66.  Falls are a major cause of broken bones and head injuries in people who are older than age 62. Take precautions to prevent a fall at home.  Work with your health care provider to learn what changes you can make to improve your health and wellness and to prevent falls. This information is not intended to replace advice given to you by your health care provider. Make sure you discuss any questions you have with your health care provider. Document Revised: 07/24/2018 Document Reviewed: 02/13/2017 Elsevier Patient Education  2020 [place], Adult The ears produce a substance called earwax that helps keep bacteria out of the ear and protects the skin in the ear canal. Occasionally, earwax can build up in the ear and cause discomfort or hearing loss. What increases the risk? This condition is more likely to develop in people who:  Are male.  Are elderly.  Naturally produce more earwax.  Clean  their ears often with cotton swabs.  Use earplugs often.  Use in-ear headphones often.  Wear hearing aids.  Have narrow ear canals.  Have earwax that is overly thick or sticky.  Have eczema.  Are dehydrated.  Have excess hair in the ear canal. What are the signs or symptoms? Symptoms of this condition include:  Reduced or muffled hearing.  A feeling of fullness in the ear or feeling that the ear is plugged.  Fluid coming from the ear.  Ear pain.  Ear itch.  Ringing in the ear.  Coughing.  An obvious piece of earwax that can be seen inside the ear canal. How is this diagnosed? This condition may be diagnosed based on:  Your symptoms.  Your medical history.  An ear exam. During the exam, your health care provider will look into your ear with an instrument called an otoscope. You may have tests, including a hearing test. How is this treated? This condition may be treated by:  Using ear drops to soften the earwax.  Having  the earwax removed by a health care provider. The health care provider may: ? Flush the ear with water. ? Use an instrument that has a loop on the end (curette). ? Use a suction device.  Surgery to remove the wax buildup. This may be done in severe cases. Follow these instructions at home:   Take over-the-counter and prescription medicines only as told by your health care provider.  Do not put any objects, including cotton swabs, into your ear. You can clean the opening of your ear canal with a washcloth or facial tissue.  Follow instructions from your health care provider about cleaning your ears. Do not over-clean your ears.  Drink enough fluid to keep your urine clear or pale yellow. This will help to thin the earwax.  Keep all follow-up visits as told by your health care provider. If earwax builds up in your ears often or if you use hearing aids, consider seeing your health care provider for routine, preventive ear cleanings. Ask  your health care provider how often you should schedule your cleanings.  If you have hearing aids, clean them according to instructions from the manufacturer and your health care provider. Contact a health care provider if:  You have ear pain.  You develop a fever.  You have blood, pus, or other fluid coming from your ear.  You have hearing loss.  You have ringing in your ears that does not go away.  Your symptoms do not improve with treatment.  You feel like the room is spinning (vertigo). Summary  Earwax can build up in the ear and cause discomfort or hearing loss.  The most common symptoms of this condition include reduced or muffled hearing and a feeling of fullness in the ear or feeling that the ear is plugged.  This condition may be diagnosed based on your symptoms, your medical history, and an ear exam.  This condition may be treated by using ear drops to soften the earwax or by having the earwax removed by a health care provider.  Do not put any objects, including cotton swabs, into your ear. You can clean the opening of your ear canal with a washcloth or facial tissue. This information is not intended to replace advice given to you by your health care provider. Make sure you discuss any questions you have with your health care provider. Document Revised: 03/15/2017 Document Reviewed: 06/13/2016 Elsevier Patient Education  2020 [place] with Quitting Smoking  Quitting smoking is a physical and mental challenge. You will face cravings, withdrawal symptoms, and temptation. Before quitting, work with your health care provider to make a plan that can help you cope. Preparation can help you quit and keep you from giving in. How can I cope with cravings? Cravings usually last for 5-10 minutes. If you get through it, the craving will pass. Consider taking the following actions to help you cope with cravings:  Keep your mouth busy: ? Chew sugar-free gum. ? Suck  on hard candies or a straw. ? Brush your teeth.  Keep your hands and body busy: ? Immediately change to a different activity when you feel a craving. ? Squeeze or play with a ball. ? Do an activity or a hobby, like making bead jewelry, practicing needlepoint, or working with wood. ? Mix up your normal routine. ? Take a short exercise break. Go for a quick walk or run up and down stairs. ? Spend time in public places where smoking is not allowed.  Focus on doing something kind or helpful for someone else.  Call a friend or family member to talk during a craving.  Join a support group.  Call a quit line, such as 1-800-QUIT-NOW.  Talk with your health care provider about medicines that might help you cope with cravings and make quitting easier for you. How can I deal with withdrawal symptoms? Your body may experience negative effects as it tries to get used to not having nicotine in the system. These effects are called withdrawal symptoms. They may include:  Feeling hungrier than normal.  Trouble concentrating.  Irritability.  Trouble sleeping.  Feeling depressed.  Restlessness and agitation.  Craving a cigarette. To manage withdrawal symptoms:  Avoid places, people, and activities that trigger your cravings.  Remember why you want to quit.  Get plenty of sleep.  Avoid coffee and other caffeinated drinks. These may worsen some of your symptoms. How can I handle social situations? Social situations can be difficult when you are quitting smoking, especially in the first few weeks. To manage this, you can:  Avoid parties, bars, and other social situations where people might be smoking.  Avoid alcohol.  Leave right away if you have the urge to smoke.  Explain to your family and friends that you are quitting smoking. Ask for understanding and support.  Plan activities with friends or family where smoking is not an option. What are some ways I can cope with  stress? Wanting to smoke may cause stress, and stress can make you want to smoke. Find ways to manage your stress. Relaxation techniques can help. For example:  Breathe slowly and deeply, in through your nose and out through your mouth.  Listen to soothing, relaxing music.  Talk with a family member or friend about your stress.  Light a candle.  Soak in a bath or take a shower.  Think about a peaceful place. What are some ways I can prevent weight gain? Be aware that many people gain weight after they quit smoking. However, not everyone does. To keep from gaining weight, have a plan in place before you quit and stick to the plan after you quit. Your plan should include:  Having healthy snacks. When you have a craving, it may help to: ? Eat plain popcorn, crunchy carrots, celery, or other cut vegetables. ? Chew sugar-free gum.  Changing how you eat: ? Eat small portion sizes at meals. ? Eat 4-6 small meals throughout the day instead of 1-2 large meals a day. ? Be mindful when you eat. Do not watch television or do other things that might distract you as you eat.  Exercising regularly: ? Make time to exercise each day. If you do not have time for a long workout, do short bouts of exercise for 5-10 minutes several times a day. ? Do some form of strengthening exercise, like weight lifting, and some form of aerobic exercise, like running or swimming.  Drinking plenty of water or other low-calorie or no-calorie drinks. Drink 6-8 glasses of water daily, or as much as instructed by your health care provider. Summary  Quitting smoking is a physical and mental challenge. You will face cravings, withdrawal symptoms, and temptation to smoke again. Preparation can help you as you go through these challenges.  You can cope with cravings by keeping your mouth busy (such as by chewing gum), keeping your body and hands busy, and making calls to family, friends, or a helpline for people who want to  quit smoking.  You can cope with withdrawal symptoms by avoiding places where people smoke, avoiding drinks with caffeine, and getting plenty of rest.  Ask your health care provider about the different ways to prevent weight gain, avoid stress, and handle social situations. This information is not intended to replace advice given to you by your health care provider. Make sure you discuss any questions you have with your health care provider. Document Revised: 03/15/2017 Document Reviewed: 03/30/2016 Elsevier Patient Education  [place].  Pulmonary Nodule A pulmonary nodule is a small, round growth of tissue in the lung. It is sometimes referred to as a "shadow" or "spot on the lung." Nodules range in size from less than 1/5 of an inch (4 mm) to a little bigger than an inch (30 mm). Pulmonary nodules can be either noncancerous (benign) or cancerous (malignant). Most are noncancerous. Smaller nodules in people who do not smoke and do not have any other risk factors for lung cancer are more likely to be noncancerous. Larger, irregular nodules in people who smoke or who have a strong family history of lung cancer are more likely to be cancerous. What are the causes? This condition may be caused by:  A bacterial, fungal, or viral infection, such as tuberculosis. The infection is usually an old and inactive one.  A noncancerous mass of tissue.  Inflammation from conditions such as rheumatoid arthritis.  Abnormal blood vessels in the lungs.  Cancerous tissue, such as lung cancer or a cancer in another part of the body that has spread to the lung. What are the signs or symptoms? This condition usually does not cause symptoms. If symptoms appear, they are usually related to the underlying cause. For example, if the condition is caused by an infection, you may have a cough or fever. How is this diagnosed? This condition is usually diagnosed with an X-ray or CT scan. To help determine  whether a pulmonary nodule is benign or malignant, your health care provider will:  Take your medical history.  Perform a physical exam.  Order tests, including: ? Blood tests. ? A skin test called a tuberculin test. This test is done to check if you have been exposed to the germ that causes tuberculosis. ? Chest X-rays. ? A CT scan. This test shows smaller pulmonary nodules more clearly and with more detail than an X-ray. ? A positron emission tomography (PET) scan. This test is done to check if the nodule is cancerous. During the test, a safe amount of a radioactive substance is injected into the bloodstream. Then a picture is taken. ? Biopsy. In this test, a tiny piece of the pulmonary nodule is removed and then examined under a microscope. How is this treated? Treatment for this condition depends on whether the pulmonary nodule is malignant or benign as well as your risk of getting cancer.  Noncancerous nodules usually do not need to be treated, but they may need to be monitored with CT scans. If a CT scan shows that the pulmonary nodule got bigger, more tests may be done.  Some nodules need to be removed. If this is the case, you may have a procedure called a thoractomy. During the procedure, your health care provider will make an incision in your chest and remove the part of the lung where the nodule is located. Follow these instructions at home:   Take over-the-counter and prescription medicines only as told by your health care provider.  Do not use any products that contain nicotine or  tobacco, such as cigarettes and e-cigarettes. If you need help quitting, ask your health care provider.  Keep all follow-up visits as told by your health care provider. This is important. Contact a health care provider if:  You have trouble breathing when you are active.  You feel sick or unusually tired.  You do not feel like eating.  You lose weight without trying.  You develop chills or  night sweats. Get help right away if:  You cannot catch your breath.  You begin wheezing.  You cannot stop coughing.  You cough up blood.  You become dizzy or feel like you are going to faint.  You have sudden chest pain.  You have a fever or persistent symptoms for more than 2-3 days.  You have a fever and your symptoms suddenly get worse. Summary  A pulmonary nodule is a small, round growth of tissue in the lung. Most pulmonary nodules are noncancerous.  This condition is usually diagnosed with an X-ray or CT scan.  Common causes of pulmonary nodules include infection, inflammation, and noncancerous growths.  Though less common, if a nodule is found to be cancerous, you will need specific diagnostic tests and treatment options as directed by your medical provider.  Treatment for this condition depends on whether the pulmonary nodule is benign or malignant as well as your risk of getting cancer. This information is not intended to replace advice given to you by your health care provider. Make sure you discuss any questions you have with your health care provider. Document Revised: 04/26/2017 Document Reviewed: 05/01/2016 Elsevier Patient Education  [place].

## 2019-10-22 NOTE — Progress Notes (Signed)
The proposed treatment discussed in cancer conference 10/22/19 is for discussion purpose only and is not a binding recommendation.  The patient was not physically examined nor present for their treatment options.  Therefore, final treatment plans cannot be decided.

## 2019-10-22 NOTE — Telephone Encounter (Signed)
Referral faxed to [facility] for FNA/Bx of rectal sub-mucosal mass.  I spoke with Jesse [name].  She will have the providers review and call back with the appt date and time. Patient notified of the plan.

## 2019-10-23 ENCOUNTER — Telehealth: Payer: Self-pay

## 2019-10-23 NOTE — Telephone Encounter (Signed)
°  Follow up Call-  Call back number 10/21/2019  Post procedure Call Back phone  # [phone]  Permission to leave phone message Yes  Some recent data might be hidden     Patient questions:  Do you have a fever, pain , or abdominal swelling? No. Pain Score  0 *  Have you tolerated food without any problems? Yes.    Have you been able to return to your normal activities? Yes.    Do you have any questions about your discharge instructions: Diet   No. Medications  No. Follow up visit  No.  Do you have questions or concerns about your Care? No.  Actions: * If pain score is 4 or above: 1. No action needed, pain <4.Have you developed a fever since your procedure? no  2.   Have you had an respiratory symptoms (SOB or cough) since your procedure? no  3.   Have you tested positive for COVID 19 since your procedure no  4.   Have you had any family members/close contacts diagnosed with the COVID 19 since your procedure?  no   If yes to any of these questions please route to Joylene John, RN and Erenest Rasher, RN

## 2019-10-29 NOTE — Telephone Encounter (Signed)
Patient will see Dr. Tresa Moore at [facility] 11/05/19

## 2019-10-29 NOTE — Progress Notes (Addendum)
Patient: Jesse [name], Male    DOB: Jun 16, 1950, 69 y.o.   MRN: [id number] Visit Date: 10/29/2019  Today's Provider: [name], MD  Subjective:    Chief Complaint  Patient presents with  . Annual Exam   SHALIN VONBARGEN is a 69 y.o. male who presents today for CPE  Lung nodule- noted on screening CT chest 2/2 smoking hx.  Pt s/p video bronch by Pulm on 10/13/19.   Tobacco use- patient has not had a cigarette in 11 days.  Using nicorette gum.  Rectal mass- pt had colonoscopy yesterday.  Mass noted on PET scan does not seem to be coming from rectum.  Referral to Urology place for FNA.  HTN- norvasc 5 mg daily.  HLD/atherosclerosis: taking lipitor 40 mg.  Atherosclerosis noted on recent imaging. Denies myalgias.  GERD: prilosec.   Patient Active Problem List   Diagnosis Date Noted  . Lung nodule 10/13/2019  . Essential hypertension 09/28/2016  . Coronary artery calcification 09/28/2016  . Impaired glucose tolerance 09/28/2016  . TOBACCO USER 02/16/2009  . NEOPLASM, MALIGNANT, BLADDER, HX OF 07/18/2007  . VITAMIN D DEFICIENCY 06/03/2007  . DIVERTICULOSIS, COLON 01/14/2007  . Osteoarthritis 01/14/2007  . Dyslipidemia 01/09/2007  . GERD 01/09/2007  . REFLUX ESOPHAGITIS 06/01/2004  . GASTRITIS, CHRONIC 06/01/2004   Past Medical History:  Diagnosis Date  . Anemia   . Cancer [facility])    bladder cancer  . COPD (chronic obstructive pulmonary disease) (HCC)    mild   . DIVERTICULOSIS, COLON 01/14/2007   Qualifier: Diagnosis of  By: Paulina Fusi RN, [name]   . GASTRITIS, CHRONIC 06/01/2004   Qualifier: Diagnosis of  By: Nolon Rod CMA (AAMA), Robin    . GERD 01/09/2007   Qualifier: Diagnosis of  By: Scherrie Gerlach    . HYPERLIPIDEMIA 01/09/2007   Qualifier: Diagnosis of  By: Scherrie Gerlach    . Hypertension   . Lung mass    left upper nodule  . Macular degeneration    right eye  . NEOPLASM, MALIGNANT, BLADDER, HX OF 2002   Qualifier: Diagnosis of  By: Nolon Rod CMA  (AAMA), Robin  / no chemo or radiation  . OSTEOARTHRITIS 01/14/2007   Qualifier: Diagnosis of  By: Paulina Fusi RN, [name] - knees  . Rectal mass   . Reflux esophagitis 06/01/2004   Qualifier: Diagnosis of  By: Nolon Rod CMA (AAMA), Robin    . TOBACCO USER 02/16/2009   Qualifier: Diagnosis of  By: Burnice Logan  MD, Doretha Sou   . VITAMIN D DEFICIENCY 06/03/2007   Qualifier: Diagnosis of  By: Burnice Logan  MD, Doretha Sou    No Known Allergies     Medications: Outpatient Medications Prior to Visit  Medication Sig  . ibuprofen (ADVIL,MOTRIN) 200 MG tablet Take 800 mg by mouth every 8 (eight) hours as needed for moderate pain.   . Multiple Vitamins-Minerals (PRESERVISION AREDS 2+MULTI VIT PO) Take 1 capsule by mouth in the morning and at bedtime.  Marland Kitchen Specialty Vitamins Products (PROSTATE PO) Take 2 capsules by mouth daily.   . tadalafil (CIALIS) 20 MG tablet Take 1 tablet (20 mg total) by mouth daily as needed for erectile dysfunction.  . varenicline (CHANTIX STARTING MONTH PAK) 0.5 MG X 11 & 1 MG X 42 tablet Take one 0.5 mg tablet by mouth once daily for 3 days, then increase to one 0.5 mg tablet twice daily for 4 days, then increase to one 1 mg tablet twice daily.  . [  DISCONTINUED] amLODipine (NORVASC) 5 MG tablet Take 1 tablet (5 mg total) by mouth daily.  . [DISCONTINUED] atorvastatin (LIPITOR) 40 MG tablet TAKE 1 TABLET BY MOUTH  DAILY AT 6PM (Patient taking differently: Take 20 mg by mouth daily. )  . [DISCONTINUED] omeprazole (PRILOSEC) 40 MG capsule Take 1 capsule (40 mg total) by mouth daily.   Facility-Administered Medications Prior to Visit  Medication Dose Route Frequency Provider  . 0.9 %  sodium chloride infusion  500 mL Intravenous Continuous Gatha Mayer, MD    No Known Allergies  Patient Care Team: [name], MD as PCP - General (Family Medicine)  Review of Systems General: Denies fever, chills, night sweats, changes in weight, changes in appetite HEENT: Denies headaches,  ear pain, changes in vision, rhinorrhea, sore throat CV: Denies CP, palpitations, SOB, orthopnea Pulm: Denies SOB, cough, wheezing GI: Denies abdominal pain, nausea, vomiting, diarrhea, constipation GU: Denies dysuria, hematuria, frequency, vaginal discharge Msk: Denies muscle cramps, joint pains Neuro: Denies weakness, numbness, tingling Skin: Denies rashes, bruising Psych: Denies depression, anxiety, hallucinations, trouble with memory    Objective:    Vitals: BP 120/70 (BP Location: Left Arm, Patient Position: Sitting, Cuff Size: Normal)   Pulse 70   Temp 97.9 F (36.6 C) (Temporal)   Ht 5\' 9"  (1.753 m)   Wt 170 lb 2 oz (77.2 kg)   SpO2 98%   BMI 25.12 kg/m   Physical Exam Gen. Pleasant, well developed, well-nourished, in NAD HEENT - [place]/AT, PERRL, EOMI, conjunctive clear, no scleral icterus, no nasal drainage, pharynx without erythema or exudate.  B/l canals occluded with cerumen.  Cerumen remained after irrigation.  TMs normal after removal of cerumen with curette. Neck: No JVD, no thyromegaly, no carotid bruits Lungs: no use of accessory muscles, CTAB, no wheezes, rales or rhonchi Cardiovascular: RRR, No r/g/m, no peripheral edema Abdomen: BS present, soft, nontender,nondistended, no hepatosplenomegaly Musculoskeletal: No deformities, moves all four extremities, no cyanosis or clubbing, normal tone Neuro:  A&Ox3, CN II-XII intact, normal gait Skin:  Warm, dry, intact, no lesions Psych: normal affect, mood appropriate Rectal exam deferred as done last OFV.  Most recent functional status assessment: In your present state of health, do you have any difficulty performing the following activities: 06/03/2019  Hearing? N  Vision? N  Difficulty concentrating or making decisions? N  Walking or climbing stairs? N  Dressing or bathing? N  Doing errands, shopping? N  Preparing Food and eating ? N  Using the Toilet? N  In the past six months, have you accidently leaked urine? N    Do you have problems with loss of bowel control? N  Managing your Medications? N  Managing your Finances? N  Housekeeping or managing your Housekeeping? N  Some recent data might be hidden    Most recent fall risk assessment: Fall Risk  06/03/2019  Falls in the past year? 0  Risk for fall due to : Medication side effect  Follow up Falls evaluation completed;Education provided;Falls prevention discussed     Most recent depression screenings: PHQ 2/9 Scores 06/03/2019 06/03/2019  PHQ - 2 Score 0 0      Assessment & Plan:     Immunization History  Administered Date(s) Administered  . Fluad Quad(high Dose 65+) 01/16/2019  . PFIZER SARS-COV-2 Vaccination 06/07/2019, 06/30/2019  . Pneumococcal Conjugate-13 06/08/2015  . Pneumococcal Polysaccharide-23 09/28/2016  . Tdap 10/07/2012    Health Maintenance  Topic Date Due  . INFLUENZA VACCINE  11/15/2019  . TETANUS/TDAP  10/08/2022  . COVID-19 Vaccine  Completed  . Hepatitis C Screening  Completed  . PNA vac Low Risk Adult  Completed     Discussed health benefits of physical activity, and encouraged him to engage in regular exercise appropriate for his age and condition.    Essential hypertension -Controlled -Continue lifestyle modifications - Plan: amLODipine (NORVASC) 5 MG tablet  Atherosclerosis of aorta (HCC)  -Noted on imaging - Plan: atorvastatin (LIPITOR) 40 MG tablet, Lipid panel  Lung nodule -s/p bronch.  Adenocarcinoma per pathology report. -Continue follow-up with pulmonology -Continue follow-up with oncology  Cigarette nicotine dependence without complication -Smoking cessation counseling greater than 3 minutes, less than 10 minutes -Congratulated on being tobacco free x 11 days -Continue to Nicorette gum -Given handout - Plan: nicotine polacrilex (NICORETTE) 4 MG gum  Bilateral impacted cerumen -Consent obtained.  Bilateral ears irrigated without much success.  Curette and forceps used to remove  copious amounts of impacted cerumen.  Patient tolerated procedure well. -Given handout  Gastroesophageal reflux disease, unspecified whether esophagitis present -Avoid foods known to cause problems - Plan: omeprazole (PRILOSEC) 40 MG capsule  Rectal mass -large submucosal mass noted on Endoscopy -Follow-up with urology for ultrasound-guided FNA -Referral placed by GI  F/u prn  [name], MD

## 2019-10-30 ENCOUNTER — Encounter: Payer: Self-pay | Admitting: Family Medicine

## 2019-10-30 ENCOUNTER — Institutional Professional Consult (permissible substitution): Payer: Medicare Other | Admitting: Thoracic Surgery (Cardiothoracic Vascular Surgery)

## 2019-10-30 ENCOUNTER — Other Ambulatory Visit: Payer: Self-pay

## 2019-10-30 ENCOUNTER — Encounter: Payer: Self-pay | Admitting: Thoracic Surgery (Cardiothoracic Vascular Surgery)

## 2019-10-30 VITALS — BP 144/78 | HR 82 | Temp 97.9°F | Resp 20 | Ht 69.0 in | Wt 176.0 lb

## 2019-10-30 DIAGNOSIS — C3412 Malignant neoplasm of upper lobe, left bronchus or lung: Secondary | ICD-10-CM

## 2019-10-30 NOTE — Progress Notes (Signed)
[address]       [place] [address]             [phone]                    Jesse [name] [facility] Record #[id number] Date of Birth: 1951/01/14  Referring: Garner Nash, DO Primary Care: [name], MD Primary Cardiologist: No primary care provider on file.  Chief Complaint:    Chief Complaint  Patient presents with  . Lung Lesion    Surgical consult    History of Present Illness:    Jesse [name] 69 y.o. male 69 year old gentleman referred by Dr. 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  He has a history of tobacco use and was in our lung cancer screening program and noted interval increase in size of this lesion from 4 mm.  PET scan showed metabolic activity with an SUV of 2.6 and he subsequently underwent a navigational bronchoscopic biopsy which showed adenocarcinoma.  Incidentally he was noted to have a pelvic mass between his bladder and colon on the PET/CT and this is currently being evaluated as well.  He does endorse some dysuria and some constipation.  His weight has been stable for the most part.  He continues to remain very active with excellent exercise tolerance.    Smoking Hx: Quit tobacco 2 and half weeks ago.   Zubrod Score: At the time of surgery this patient's most appropriate activity status/level should be described as: [x]     0    Normal activity, no symptoms []     1    Restricted in physical strenuous activity but ambulatory, able to do out light work []     2    Ambulatory and capable of self care, unable to do work activities, up and about               >50 % of waking hours                              []     3    Only limited self care, in bed greater than 50% of waking hours []     4    Completely disabled, no self care, confined to bed or chair []     5    Moribund   Past Medical History:  Diagnosis Date  . Anemia   . Cancer [name])    bladder cancer  .  COPD (chronic obstructive pulmonary disease) (HCC)    mild   . DIVERTICULOSIS, COLON 01/14/2007   Qualifier: Diagnosis of  By: Paulina Fusi RN, [name]   . GASTRITIS, CHRONIC 06/01/2004   Qualifier: Diagnosis of  By: Nolon Rod CMA (AAMA), Robin    . GERD 01/09/2007   Qualifier: Diagnosis of  By: Scherrie Gerlach    . HYPERLIPIDEMIA 01/09/2007   Qualifier: Diagnosis of  By: Scherrie Gerlach    . Hypertension   . Lung mass    left upper nodule  . Macular degeneration    right eye  . NEOPLASM, MALIGNANT, BLADDER, HX OF 2002   Qualifier: Diagnosis of  By: Nolon Rod CMA (AAMA), Robin  / no chemo or radiation  . OSTEOARTHRITIS 01/14/2007   Qualifier: Diagnosis of  By: Paulina Fusi RN, [name] - knees  . Rectal mass   . Reflux esophagitis 06/01/2004  Qualifier: Diagnosis of  By: Nolon Rod CMA (AAMA), Robin    . TOBACCO USER 02/16/2009   Qualifier: Diagnosis of  By: Burnice Logan  MD, Doretha Sou   . VITAMIN D DEFICIENCY 06/03/2007   Qualifier: Diagnosis of  By: Burnice Logan  MD, Doretha Sou     Past Surgical History:  Procedure Laterality Date  . BRONCHIAL BIOPSY  10/13/2019   Procedure: BRONCHIAL BIOPSIES;  Surgeon: Garner Nash, DO;  Location: [facility];  Service: Pulmonary;;  . BRONCHIAL BRUSHINGS  10/13/2019   Procedure: BRONCHIAL BRUSHINGS;  Surgeon: Garner Nash, DO;  Location: [place] [facility];  Service: Pulmonary;;  . BRONCHIAL NEEDLE ASPIRATION BIOPSY  10/13/2019   Procedure: BRONCHIAL NEEDLE ASPIRATION BIOPSIES;  Surgeon: Garner Nash, DO;  Location: [place] [facility];  Service: Pulmonary;;  . BRONCHIAL WASHINGS  10/13/2019   Procedure: BRONCHIAL WASHINGS;  Surgeon: Garner Nash, DO;  Location: [place] [facility] [facility];  Service: Pulmonary;;  . COLONOSCOPY     multiple  . KNEE SURGERY  07/20/2008   bilat.  Marland Kitchen NASAL SEPTUM SURGERY  1995  . TONSILLECTOMY    . TRANSURETHRAL RESECTION OF BLADDER TUMOR  2002  . UPPER GASTROINTESTINAL ENDOSCOPY    . VIDEO BRONCHOSCOPY WITH ENDOBRONCHIAL NAVIGATION N/A  10/13/2019   Procedure: VIDEO BRONCHOSCOPY WITH ENDOBRONCHIAL NAVIGATION;  Surgeon: Garner Nash, DO;  Location: [place];  Service: Pulmonary;  Laterality: N/A;  . WISDOM TOOTH EXTRACTION      Family History  Problem Relation Age of Onset  . Dementia Mother   . Diabetes Father   . Mental illness Father   . Peripheral vascular disease Brother      Social History   Tobacco Use  Smoking Status Current Every Day Smoker  . Years: 40.00  . Types: Cigarettes  Smokeless Tobacco Never Used  Tobacco Comment   8 -10 cigarettes daily, cut back last month 08/2019, now 4-5 cig per day    Social History   Substance and Sexual Activity  Alcohol Use Yes  . Alcohol/week: 15.0 standard drinks  . Types: 15 Standard drinks or equivalent per week   Comment: wine/beer/martini     No Known Allergies  Current Outpatient Medications  Medication Sig Dispense Refill  . amLODipine (NORVASC) 5 MG tablet Take 1 tablet (5 mg total) by mouth daily. 90 tablet 3  . atorvastatin (LIPITOR) 40 MG tablet TAKE 1 TABLET BY MOUTH  DAILY AT 6PM 90 tablet 3  . ibuprofen (ADVIL,MOTRIN) 200 MG tablet Take 800 mg by mouth every 8 (eight) hours as needed for moderate pain.     . nicotine polacrilex (NICORETTE) 4 MG gum Take 1 each (4 mg total) by mouth as needed for smoking cessation. 100 tablet 1  . omeprazole (PRILOSEC) 40 MG capsule Take 1 capsule (40 mg total) by mouth daily. 90 capsule 3  . Specialty Vitamins Products (PROSTATE PO) Take 2 capsules by mouth daily.     . tadalafil (CIALIS) 20 MG tablet Take 1 tablet (20 mg total) by mouth daily as needed for erectile dysfunction. 6 tablet 6  . varenicline (CHANTIX STARTING MONTH PAK) 0.5 MG X 11 & 1 MG X 42 tablet Take one 0.5 mg tablet by mouth once daily for 3 days, then increase to one 0.5 mg tablet twice daily for 4 days, then increase to one 1 mg tablet twice daily. 53 tablet 0   Current Facility-Administered Medications  Medication Dose Route  Frequency Provider Last Rate Last Admin  . 0.9 %  sodium chloride infusion  500 mL Intravenous Continuous Gatha Mayer, MD        Review of Systems  Constitutional: Negative.   Respiratory: Negative.   Cardiovascular: Negative.   Gastrointestinal: Positive for constipation.  Genitourinary: Positive for dysuria.     PHYSICAL EXAMINATION: BP (!) 144/78   Pulse 82   Temp 97.9 F (36.6 C) (Temporal)   Resp 20   Ht 5\' 9"  (1.753 m)   Wt 176 lb (79.8 kg)   SpO2 98%   BMI 25.99 kg/m  Physical Exam Constitutional:      Appearance: Normal appearance.  HENT:     Head: Normocephalic and atraumatic.  Cardiovascular:     Rate and Rhythm: Normal rate and regular rhythm.     Pulses: Normal pulses.     Heart sounds: No murmur heard.   Pulmonary:     Effort: Pulmonary effort is normal. No respiratory distress.  Abdominal:     General: Abdomen is flat.     Palpations: Abdomen is soft.  Musculoskeletal:        General: Normal range of motion.  Neurological:     General: No focal deficit present.     Mental Status: He is alert and oriented to person, place, and time.     Diagnostic Studies & Laboratory data:     Recent Radiology Findings:   CT Abdomen Pelvis W Contrast  Result Date: 10/09/2019 CLINICAL DATA:  Mass or lump.  Hypermetabolic mass on PET scan. EXAM: CT ABDOMEN AND PELVIS WITH CONTRAST TECHNIQUE: Multidetector CT imaging of the abdomen and pelvis was performed using the standard protocol following bolus administration of intravenous contrast. CONTRAST:  178mL ISOVUE-300 IOPAMIDOL (ISOVUE-300) INJECTION 61% COMPARISON:  PET-CT 09/16/2019 FINDINGS: Lower chest: Lung bases are clear. Hepatobiliary: No focal hepatic lesion.  Gallbladder normal. Pancreas: Pancreas is normal. No ductal dilatation. No pancreatic inflammation. Spleen: Normal spleen Adrenals/urinary tract: Adrenal glands and kidneys are normal. The ureters and bladder normal. Stomach/Bowel: Stomach, duodenum and  small bowel normal. Ascending transverse colon normal. Several diverticula of the sigmoid colon. Anterior to the rectum and inseparable from the anterior rectal wall is a rounded 5.2 x 3.9 cm mass (image 78/2. This mass is peripherally enhancing and has central low attenuation suggestive of necrosis. This mass was intensely hypermetabolic on comparison PET-CT scan. The mass abuts the posterior wall of the prostate gland also with no discernible flap pain. Lesion is removed from the bladder. This mass is very low (at the level of the internal anal sphincter) and certainly palpable by digital rectal exam. Favor mass originate from the anterior wall the rectum or anus. Secondary differential would be a large necrotic lymph node which is less favored. Vascular/Lymphatic: Abdominal aorta is normal caliber. No periportal or retroperitoneal adenopathy. No pelvic adenopathy. Reproductive: Prostate gland grossly normal. Prostate gland abuts the above-described mass on posterior wall. There is mild nodular indentation into the base the bladder typical of benign prostate hypertrophy. Other: No free fluid. Musculoskeletal: No aggressive osseous lesion. IMPRESSION: 1. Unusual large peripheral enhancing mass position between the anterior wall the rectum and posterior wall of the prostate gland. Mass has central necrosis and was intensely hypermetabolic on comparison FDG PET scan consistent with malignancy. Favor mass originating from the anus versus anterior wall of the rectum. Favor ANAL CARCINOMA. Mass is very low at the level of the internal anal sphincter. Recommend GI consultation for tissue sampling. 2. No evidence of metastatic disease in the abdomen pelvis. No lymphadenopathy. These results will be called  to the ordering clinician or representative by the Radiologist Assistant, and communication documented in the PACS or [employer]. Electronically Signed   By: Suzy Bouchard M.D.   On: 10/09/2019 09:14   DG  CHEST PORT 1 VIEW  Result Date: 10/13/2019 CLINICAL DATA:  Status post bronchoscopy. EXAM: PORTABLE CHEST 1 VIEW COMPARISON:  October 03, 2004. FINDINGS: The heart size and mediastinal contours are within normal limits. Both lungs are clear. No pneumothorax or pleural effusion is noted. The visualized skeletal structures are unremarkable. IMPRESSION: No active disease. Electronically Signed   By: Marijo Conception M.D.   On: 10/13/2019 11:54   CT Super D Chest Wo Contrast  Result Date: 10/07/2019 CLINICAL DATA:  69 year old male with history of left upper lobe pulmonary nodule. Followup study. EXAM: CT CHEST WITHOUT CONTRAST TECHNIQUE: Multidetector CT imaging of the chest was performed using thin slice collimation for electromagnetic bronchoscopy planning purposes, without intravenous contrast. COMPARISON:  Multiple priors, most recently PET-CT 09/16/2019 and chest CT 09/02/2019. FINDINGS: Cardiovascular: Heart size is normal. There is no significant pericardial fluid, thickening or pericardial calcification. There is aortic atherosclerosis, as well as atherosclerosis of the great vessels of the mediastinum and the coronary arteries, including calcified atherosclerotic plaque in the left main, left anterior descending, left circumflex and right coronary arteries. Calcifications of the aortic valve. 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 Mediastinum/Nodes: No pathologically enlarged mediastinal or hilar lymph nodes. Please note that accurate exclusion of hilar adenopathy is limited on noncontrast CT scans. Esophagus is unremarkable in appearance. No axillary lymphadenopathy. Lungs/Pleura: Nodule of concern in the left upper lobe appears similar to the prior examinations, currently measuring 1.2 x 0.8 x 0.5 cm (axial image 86 of series 3 and coronal image 38 of series 5). A few other scattered small pulmonary nodules are noted, most notably in the apex of the right upper lobe where these  measure up to 5 mm (axial image 24 of series 3), similar to prior examination. No other larger more suspicious appearing pulmonary nodules or masses are noted. No acute consolidative airspace disease. No pleural effusions. Mild diffuse bronchial wall thickening with mild centrilobular and paraseptal emphysema. Upper Abdomen: Aortic atherosclerosis. Musculoskeletal: There are no aggressive appearing lytic or blastic lesions noted in the visualized portions of the skeleton. IMPRESSION: 1. 1.2 x 0.8 x 0.5 cm left upper lobe nodule appears similar to the prior examination. Given the hypermetabolism on the prior PET-CT, this remains concerning for potential primary bronchogenic neoplasm. 2. Mild diffuse bronchial wall thickening with mild centrilobular and paraseptal emphysema; imaging findings suggestive of underlying COPD. 3. Aortic atherosclerosis, in addition to left main and 3 vessel coronary artery disease. Please note that although the presence of coronary artery calcium documents the presence of coronary artery disease, the severity of this disease and any potential stenosis cannot be assessed on this non-gated CT examination. Assessment for potential risk factor modification, dietary therapy or pharmacologic therapy may be warranted, if clinically indicated. 4. There are calcifications of the aortic valve. Echocardiographic correlation for evaluation of potential valvular dysfunction may be warranted if clinically indicated. 5. 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 Recommend annual imaging followup by CTA or MRA. This recommendation follows 2010 ACCF/AHA/AATS/ACR/ASA/SCA/SCAI/SIR/STS/SVM Guidelines for the Diagnosis and Management of Patients with Thoracic Aortic Disease. Circulation. 2010; 121: Q683-M196. Aortic aneurysm NOS (ICD10-I71.9). Aortic Atherosclerosis (ICD10-I70.0) and Emphysema (ICD10-J43.9). Electronically Signed   By: Vinnie Langton M.D.   On: 10/07/2019 10:05  DG  C-ARM BRONCHOSCOPY  Result Date: 10/13/2019 C-ARM BRONCHOSCOPY: Fluoroscopy was utilized by the requesting physician.  No radiographic interpretation.       I have independently reviewed the above radiology studies  and reviewed the findings with the patient.   Recent Lab Findings: Lab Results  Component Value Date   WBC 5.3 10/08/2019   HGB 14.2 10/08/2019   HCT 41.4 10/08/2019   PLT 127.0 (L) 10/08/2019   GLUCOSE 130 (H) 10/08/2019   CHOL 191 10/22/2018   TRIG 211.0 (H) 10/22/2018   HDL 42.50 10/22/2018   LDLDIRECT 102.0 10/22/2018   ALT 13 10/08/2019   AST 18 10/08/2019   NA 138 10/08/2019   K 4.1 10/08/2019   CL 104 10/08/2019   CREATININE 0.79 10/08/2019   BUN 21 10/08/2019   CO2 28 10/08/2019   TSH 1.23 10/08/2017   INR 0.9 10/08/2019   HGBA1C 5.8 10/22/2018     PFTs: - FVC: 113% - FEV1: 99% -DLCO: 107%  Problem List: 7 mm left upper lobe adenocarcinoma Pelvic mass History of bladder cancer  Assessment / Plan:   This is a 69 year old male with a 7 mm left upper lobe adenocarcinoma the lung.  Additionally he also has a newly discovered pelvic mass that is currently undergoing diagnosis.  Based off of his pulmonary function, and exercise tolerance he would be a good surgical candidate and is agreeable to proceed with a left robotic assisted thoracoscopy and left upper lobectomy.  He would like to undergo further evaluation of this pelvic mass prior to surgery, and will schedule for this operation with [place] within the next month.     I  spent 40 minutes with  the patient face to face and greater then 50% of the time was spent in counseling and coordination of care.    Lajuana Matte 10/30/2019 3:28 PM

## 2019-11-05 DIAGNOSIS — C678 Malignant neoplasm of overlapping sites of bladder: Secondary | ICD-10-CM | POA: Diagnosis not present

## 2019-11-05 DIAGNOSIS — Z936 Other artificial openings of urinary tract status: Secondary | ICD-10-CM | POA: Diagnosis not present

## 2019-11-09 ENCOUNTER — Telehealth: Payer: Self-pay | Admitting: Family Medicine

## 2019-11-09 NOTE — Telephone Encounter (Signed)
Please advise 

## 2019-11-09 NOTE — Telephone Encounter (Signed)
Pt calling to get advice from Dr. Volanda Napoleon. Pt specialist doctors the thoracic surgeon and urologist are telling the patient different things when it comes to having surgery and the patient not sure what to. Please call [phone].

## 2019-11-11 DIAGNOSIS — H353123 Nonexudative age-related macular degeneration, left eye, advanced atrophic without subfoveal involvement: Secondary | ICD-10-CM | POA: Diagnosis not present

## 2019-11-11 DIAGNOSIS — H353212 Exudative age-related macular degeneration, right eye, with inactive choroidal neovascularization: Secondary | ICD-10-CM | POA: Diagnosis not present

## 2019-11-11 DIAGNOSIS — H43813 Vitreous degeneration, bilateral: Secondary | ICD-10-CM | POA: Diagnosis not present

## 2019-11-12 NOTE — Addendum Note (Signed)
Addended by: Grier Mitts R on: 11/12/2019 12:32 AM   Modules accepted: Level of Service

## 2019-11-13 ENCOUNTER — Telehealth: Payer: Self-pay | Admitting: Family Medicine

## 2019-11-13 NOTE — Telephone Encounter (Signed)
Pt states the CT surgeon want to remove the upper L lobe of his lung within the next 6 wks.  That was one wk ago.  Recovery time expected to be at least 2 weeks.  CT surgery schedule fairly flexible, though provider does not do operations Fridays  Pt states Urology, Dr. Tresa Moore wants to do a biopsy of the mass between the rectum and bladder, but can't do it until late Sept.    Pt asks this provider for her opinion on the above/how to proceed.  Asks if this provider to speak to his specialist.  Pt advised this provider will try to reach out to Urology to see if bx can be done sooner.  If not, disucssed proceeding with left upper lobectomy as should have enough time in between procedures to recover.  Will update pt if any new info available.  Grier Mitts, MD

## 2019-11-13 NOTE — Telephone Encounter (Signed)
Pt called

## 2019-11-27 ENCOUNTER — Other Ambulatory Visit: Payer: Self-pay

## 2019-11-27 DIAGNOSIS — C3412 Malignant neoplasm of upper lobe, left bronchus or lung: Secondary | ICD-10-CM

## 2019-12-11 NOTE — Progress Notes (Signed)
Your procedure is scheduled on Wednesday September 1st.  Report to [facility] Main Entrance "A" at 06:30 A.M., and check in at the Admitting office.  Call this number if you have problems the morning of surgery: [phone]  Call [phone] if you have any questions prior to your surgery date Monday-Friday 8am-4pm   Remember: Do not eat or drink after midnight the night before your surgery   Take these medicines the morning of surgery with A SIP OF WATER: amLODipine (NORVASC) atorvastatin (LIPITOR)  omeprazole (PRILOSEC)  As of today, STOP taking any Aspirin (unless otherwise instructed by your surgeon), Aleve, Naproxen, Ibuprofen, Motrin, Advil, Goody's, BC's, all herbal medications, fish oil, and all vitamins.    The Morning of Surgery  Do not wear jewelry.  Do not wear lotions, powders,colognes, or deodorant Men may shave face and neck.  Do not bring valuables to the hospital.  [facility] is not responsible for any belongings or valuables.  If you are a smoker, DO NOT Smoke 24 hours prior to surgery  If you wear a CPAP at night please bring your mask the morning of surgery   Remember that you must have someone to transport you home after your surgery, and remain with you for 24 hours if you are discharged the same day.   Please bring cases for contacts, glasses, hearing aids, dentures or bridgework because it cannot be worn into surgery.    Leave your suitcase in the car.  After surgery it may be brought to your room.  For patients admitted to the hospital, discharge time will be determined by your treatment team.  Patients discharged the day of surgery will not be allowed to drive home.    Special instructions:   [place]- Preparing For Surgery  Before surgery, you can play an important role. Because skin is not sterile, your skin needs to be as free of germs as possible. You can reduce the number of germs on your skin by washing with CHG (chlorahexidine  gluconate) Soap before surgery.  CHG is an antiseptic cleaner which kills germs and bonds with the skin to continue killing germs even after washing.    Oral Hygiene is also important to reduce your risk of infection.  Remember - BRUSH YOUR TEETH THE MORNING OF SURGERY WITH YOUR REGULAR TOOTHPASTE  Please do not use if you have an allergy to CHG or antibacterial soaps. If your skin becomes reddened/irritated stop using the CHG.  Do not shave (including legs and underarms) for at least 48 hours prior to first CHG shower. It is OK to shave your face.  Please follow these instructions carefully.   1. Shower the NIGHT BEFORE SURGERY and the MORNING OF SURGERY with CHG Soap.   2. If you chose to wash your hair and body, wash as usual with your normal shampoo and body-wash/soap.  3. Rinse your hair and body thoroughly to remove the shampoo and soap.  4. Apply CHG directly to the skin (ONLY FROM THE NECK DOWN) and wash gently with a scrungie or a clean washcloth.   5. Do not use on open wounds or open sores. Avoid contact with your eyes, ears, mouth and genitals (private parts). Wash Face and genitals (private parts)  with your normal soap.   6. Wash thoroughly, paying special attention to the area where your surgery will be performed.  7. Thoroughly rinse your body with warm water from the neck down.  8. DO NOT shower/wash with your normal  soap after using and rinsing off the CHG Soap.  9. Pat yourself dry with a CLEAN TOWEL.  10. Wear CLEAN PAJAMAS to bed the night before surgery  11. Place CLEAN SHEETS on your bed the night of your first shower and DO NOT SLEEP WITH PETS.  12. Wear comfortable clothes the morning of surgery.     Day of Surgery:  Please shower the morning of surgery with the CHG soap Do not apply any deodorants/lotions. Please wear clean clothes to the hospital/surgery center.   Remember to brush your teeth WITH YOUR REGULAR TOOTHPASTE.   Please read over the  following fact sheets that you were given.

## 2019-12-14 ENCOUNTER — Other Ambulatory Visit: Payer: Self-pay

## 2019-12-14 ENCOUNTER — Encounter (HOSPITAL_COMMUNITY)
Admission: RE | Admit: 2019-12-14 | Discharge: 2019-12-14 | Disposition: A | Discharge status: Home / Self Care | Payer: Medicare Other | Source: Ambulatory Visit | Attending: Thoracic Surgery (Cardiothoracic Vascular Surgery) | Admitting: Thoracic Surgery (Cardiothoracic Vascular Surgery)

## 2019-12-14 ENCOUNTER — Encounter (HOSPITAL_COMMUNITY): Payer: Self-pay

## 2019-12-14 ENCOUNTER — Other Ambulatory Visit (HOSPITAL_COMMUNITY)
Admission: RE | Admit: 2019-12-14 | Discharge: 2019-12-14 | Disposition: A | Discharge status: Home / Self Care | Payer: Medicare Other | Source: Ambulatory Visit | Attending: Thoracic Surgery (Cardiothoracic Vascular Surgery) | Admitting: Thoracic Surgery (Cardiothoracic Vascular Surgery)

## 2019-12-14 DIAGNOSIS — I10 Essential (primary) hypertension: Secondary | ICD-10-CM | POA: Diagnosis not present

## 2019-12-14 DIAGNOSIS — Z8249 Family history of ischemic heart disease and other diseases of the circulatory system: Secondary | ICD-10-CM | POA: Diagnosis not present

## 2019-12-14 DIAGNOSIS — Z8551 Personal history of malignant neoplasm of bladder: Secondary | ICD-10-CM | POA: Diagnosis not present

## 2019-12-14 DIAGNOSIS — F17210 Nicotine dependence, cigarettes, uncomplicated: Secondary | ICD-10-CM | POA: Diagnosis not present

## 2019-12-14 DIAGNOSIS — K2100 Gastro-esophageal reflux disease with esophagitis, without bleeding: Secondary | ICD-10-CM | POA: Diagnosis not present

## 2019-12-14 DIAGNOSIS — J95811 Postprocedural pneumothorax: Secondary | ICD-10-CM | POA: Diagnosis not present

## 2019-12-14 DIAGNOSIS — R911 Solitary pulmonary nodule: Secondary | ICD-10-CM

## 2019-12-14 DIAGNOSIS — R1900 Intra-abdominal and pelvic swelling, mass and lump, unspecified site: Secondary | ICD-10-CM | POA: Diagnosis not present

## 2019-12-14 DIAGNOSIS — C3412 Malignant neoplasm of upper lobe, left bronchus or lung: Secondary | ICD-10-CM | POA: Diagnosis not present

## 2019-12-14 DIAGNOSIS — Z01812 Encounter for preprocedural laboratory examination: Secondary | ICD-10-CM | POA: Insufficient documentation

## 2019-12-14 DIAGNOSIS — Z01818 Encounter for other preprocedural examination: Secondary | ICD-10-CM | POA: Insufficient documentation

## 2019-12-14 DIAGNOSIS — R300 Dysuria: Secondary | ICD-10-CM | POA: Diagnosis not present

## 2019-12-14 DIAGNOSIS — Z833 Family history of diabetes mellitus: Secondary | ICD-10-CM | POA: Diagnosis not present

## 2019-12-14 DIAGNOSIS — Z20822 Contact with and (suspected) exposure to covid-19: Secondary | ICD-10-CM | POA: Insufficient documentation

## 2019-12-14 DIAGNOSIS — J90 Pleural effusion, not elsewhere classified: Secondary | ICD-10-CM | POA: Diagnosis not present

## 2019-12-14 DIAGNOSIS — H3530 Unspecified macular degeneration: Secondary | ICD-10-CM | POA: Diagnosis not present

## 2019-12-14 DIAGNOSIS — K5900 Constipation, unspecified: Secondary | ICD-10-CM | POA: Diagnosis not present

## 2019-12-14 DIAGNOSIS — Z79899 Other long term (current) drug therapy: Secondary | ICD-10-CM | POA: Diagnosis not present

## 2019-12-14 DIAGNOSIS — E785 Hyperlipidemia, unspecified: Secondary | ICD-10-CM | POA: Diagnosis not present

## 2019-12-14 DIAGNOSIS — J449 Chronic obstructive pulmonary disease, unspecified: Secondary | ICD-10-CM | POA: Diagnosis not present

## 2019-12-14 DIAGNOSIS — G4700 Insomnia, unspecified: Secondary | ICD-10-CM | POA: Diagnosis not present

## 2019-12-14 DIAGNOSIS — I2510 Atherosclerotic heart disease of native coronary artery without angina pectoris: Secondary | ICD-10-CM | POA: Diagnosis not present

## 2019-12-14 HISTORY — DX: Cardiac murmur, unspecified: R01.1

## 2019-12-14 LAB — URINALYSIS, ROUTINE W REFLEX MICROSCOPIC
Bilirubin Urine: NEGATIVE
Glucose, UA: NEGATIVE mg/dL
Hgb urine dipstick: NEGATIVE
Ketones, ur: NEGATIVE mg/dL
Leukocytes,Ua: NEGATIVE
Nitrite: NEGATIVE
Protein, ur: NEGATIVE mg/dL
Specific Gravity, Urine: 1.018 (ref 1.005–1.030)
pH: 5 (ref 5.0–8.0)

## 2019-12-14 LAB — BLOOD GAS, ARTERIAL
Acid-base deficit: 0.9 mmol/L (ref 0.0–2.0)
Bicarbonate: 22.9 mmol/L (ref 20.0–28.0)
FIO2: 21
O2 Saturation: 97.9 %
Patient temperature: 37
pCO2 arterial: 35 mmHg (ref 32.0–48.0)
pH, Arterial: 7.43 (ref 7.350–7.450)
pO2, Arterial: 106 mmHg (ref 83.0–108.0)

## 2019-12-14 LAB — COMPREHENSIVE METABOLIC PANEL
ALT: 18 U/L (ref 0–44)
AST: 23 U/L (ref 15–41)
Albumin: 4.1 g/dL (ref 3.5–5.0)
Alkaline Phosphatase: 74 U/L (ref 38–126)
Anion gap: 12 (ref 5–15)
BUN: 20 mg/dL (ref 8–23)
CO2: 20 mmol/L — ABNORMAL LOW (ref 22–32)
Calcium: 9.4 mg/dL (ref 8.9–10.3)
Chloride: 106 mmol/L (ref 98–111)
Creatinine, Ser: 0.66 mg/dL (ref 0.61–1.24)
GFR calc Af Amer: >60 mL/min (ref >60)
GFR calc non Af Amer: >60 mL/min (ref >60)
Glucose, Bld: 122 mg/dL — ABNORMAL HIGH (ref 70–99)
Potassium: 3.9 mmol/L (ref 3.5–5.1)
Sodium: 138 mmol/L (ref 135–145)
Total Bilirubin: 0.8 mg/dL (ref 0.3–1.2)
Total Protein: 7 g/dL (ref 6.5–8.1)

## 2019-12-14 LAB — CBC
HCT: 40 % (ref 39.0–52.0)
Hemoglobin: 13.2 g/dL (ref 13.0–17.0)
MCH: 32.2 pg (ref 26.0–34.0)
MCHC: 33 g/dL (ref 30.0–36.0)
MCV: 97.6 fL (ref 80.0–100.0)
Platelets: 131 10*3/uL — ABNORMAL LOW (ref 150–400)
RBC: 4.1 MIL/uL — ABNORMAL LOW (ref 4.22–5.81)
RDW: 13.2 % (ref 11.5–15.5)
WBC: 7.2 10*3/uL (ref 4.0–10.5)
nRBC: 0 % (ref 0.0–0.2)

## 2019-12-14 LAB — PROTIME-INR
INR: 0.9 (ref 0.8–1.2)
Prothrombin Time: 12.1 seconds (ref 11.4–15.2)

## 2019-12-14 LAB — SARS CORONAVIRUS 2 (TAT 6-24 HRS): SARS Coronavirus 2: NEGATIVE

## 2019-12-14 LAB — TYPE AND SCREEN
ABO/RH(D): O POS
Antibody Screen: NEGATIVE

## 2019-12-14 LAB — SURGICAL PCR SCREEN
MRSA, PCR: NEGATIVE
Staphylococcus aureus: POSITIVE — AB

## 2019-12-14 LAB — APTT: aPTT: 28 seconds (ref 24–36)

## 2019-12-14 NOTE — Anesthesia Preprocedure Evaluation (Addendum)
Anesthesia Evaluation  Patient identified by MRN, date of birth, ID band Patient awake    Reviewed: Allergy & Precautions, NPO status , Patient's Chart, lab work & pertinent test results  Airway Mallampati: II  TM Distance: >3 FB     Dental   Pulmonary COPD, Current Smoker and Patient abstained from smoking.,    breath sounds clear to auscultation       Cardiovascular hypertension, + CAD   Rhythm:Regular Rate:Normal     Neuro/Psych    GI/Hepatic Neg liver ROS, GERD  ,  Endo/Other  negative endocrine ROS  Renal/GU negative Renal ROS     Musculoskeletal   Abdominal   Peds  Hematology  (+) anemia ,   Anesthesia Other Findings   Reproductive/Obstetrics                            Anesthesia Physical Anesthesia Plan  ASA: III  Anesthesia Plan: General   Post-op Pain Management:    Induction: Intravenous  PONV Risk Score and Plan: 2 and Ondansetron, Dexamethasone and Midazolam  Airway Management Planned: Double Lumen EBT  Additional Equipment: Arterial line  Intra-op Plan:   Post-operative Plan: Extubation in OR  Informed Consent: I have reviewed the patients History and Physical, chart, labs and discussed the procedure including the risks, benefits and alternatives for the proposed anesthesia with the patient or authorized representative who has indicated his/her understanding and acceptance.     Dental advisory given  Plan Discussed with: Anesthesiologist and CRNA  Anesthesia Plan Comments: (See Anesthesia APP note by Willeen Cass, NP-C.)      Anesthesia Quick Evaluation

## 2019-12-14 NOTE — Progress Notes (Signed)
PCP - Dr. Grier Mitts Cardiologist - denies  PPM/ICD - denies  Chest x-ray - 12/14/2019 EKG - 12/14/2019 Stress Test - denies ECHO - denies  Cardiac Cath - denies  Sleep Study - Yes, per patient about 20-30 years ago, negative.   DM: denies  Blood Thinner Instructions: N/A Aspirin Instructions: N/A  ERAS Protcol - No  COVID TEST- 12/14/2019, test results pending  Anesthesia review: YES, hx of COPD   Patient denies shortness of breath, fever, cough and chest pain at PAT appointment  All instructions explained to the patient, with a verbal understanding of the material. Patient agrees to go over the instructions while at home for a better understanding. Patient also instructed to self quarantine after being tested for COVID-19. The opportunity to ask questions was provided.

## 2019-12-14 NOTE — Progress Notes (Signed)
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   Pt reports hx heart murmur as a child. No murmur on auscultation today 12/14/19   VS: BP (!) 161/90   Pulse 72   Temp 36.9 C (Oral)   Resp 18   Ht 5\' 9"  (1.753 m)   Wt 79.4 kg   SpO2 100%   BMI 25.86 kg/m    PROVIDERS: - PCP is [name], MD   LABS: Labs reviewed: Acceptable for surgery. (all labs ordered are listed, but only abnormal results are displayed)  Labs Reviewed  BLOOD GAS, ARTERIAL - Abnormal; Notable for the following components:      Result Value   Allens test (pass/fail) BRACHIAL ARTERY (*)    All other components within normal limits  CBC - Abnormal; Notable for the following components:   RBC 4.10 (*)    Platelets 131 (*)    All other components within normal limits  COMPREHENSIVE METABOLIC PANEL - Abnormal; Notable for the following components:   CO2 20 (*)    Glucose, Bld 122 (*)    All other components within normal limits  SURGICAL PCR SCREEN  APTT  PROTIME-INR  URINALYSIS, ROUTINE W REFLEX MICROSCOPIC  TYPE AND SCREEN     IMAGES: CXR 12/14/19: results pending  CT super D chest 10/06/19:  1. 1.2 x 0.8 x 0.5 cm left upper lobe nodule appears similar to the prior examination. Given the hypermetabolism on the prior PET-CT, this remains concerning for potential primary bronchogenic neoplasm. 2. Mild diffuse bronchial wall thickening with mild centrilobular and paraseptal emphysema; imaging findings suggestive of underlying COPD. 3. Aortic atherosclerosis, in addition to left main and 3 vessel  coronary artery disease. Please note that although the presence of coronary artery calcium documents the presence of coronary artery disease, the severity of this disease and any potential stenosis cannot be assessed on this non-gated CT examination. Assessment for potential risk factor modification, dietary therapy or pharmacologic therapy may be warranted, if clinically indicated. 4. There are calcifications of the aortic valve. Echocardiographic correlation for evaluation of potential valvular dysfunction may be warranted if clinically indicated. 5. 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   EKG 12/14/19: NSR   CV: N/A  Past Medical History:  Diagnosis Date  . Anemia   . Cancer [facility])    bladder cancer  . COPD (chronic obstructive pulmonary disease) (HCC)    mild   . DIVERTICULOSIS, COLON 01/14/2007   Qualifier: Diagnosis of  By: Paulina Fusi RN, [name]   . GASTRITIS, CHRONIC 06/01/2004   Qualifier: Diagnosis of  By: Nolon Rod CMA (AAMA), Robin    . GERD 01/09/2007   Qualifier: Diagnosis of  By: Scherrie Gerlach    . Heart murmur    as a child; no murmur heard 12/14/19  . HYPERLIPIDEMIA 01/09/2007   Qualifier: Diagnosis of  By: Scherrie Gerlach    . Hypertension   . Lung mass    left upper nodule  . Macular degeneration    right  eye  . NEOPLASM, MALIGNANT, BLADDER, HX OF 2002   Qualifier: Diagnosis of  By: Nolon Rod CMA (AAMA), Robin  / no chemo or radiation  . OSTEOARTHRITIS 01/14/2007   Qualifier: Diagnosis of  By: Paulina Fusi RN, [name] - knees  . Rectal mass   . Reflux esophagitis 06/01/2004   Qualifier: Diagnosis of  By: Nolon Rod CMA (AAMA), Robin    . TOBACCO USER 02/16/2009   Qualifier: Diagnosis of  By: Burnice Logan  MD, Doretha Sou   . VITAMIN D DEFICIENCY 06/03/2007   Qualifier: Diagnosis of  By: Burnice Logan  MD, Doretha Sou     Past Surgical History:  Procedure Laterality Date  . BRONCHIAL BIOPSY  10/13/2019   Procedure: BRONCHIAL BIOPSIES;  Surgeon: Garner Nash, DO;  Location: [place] [facility];  Service: Pulmonary;;  . BRONCHIAL BRUSHINGS  10/13/2019   Procedure: BRONCHIAL BRUSHINGS;  Surgeon: Garner Nash, DO;  Location: [place] [facility];  Service: Pulmonary;;  . BRONCHIAL NEEDLE ASPIRATION BIOPSY  10/13/2019   Procedure: BRONCHIAL NEEDLE ASPIRATION BIOPSIES;  Surgeon: Garner Nash, DO;  Location: [facility];  Service: Pulmonary;;  . BRONCHIAL WASHINGS  10/13/2019   Procedure: BRONCHIAL WASHINGS;  Surgeon: Garner Nash, DO;  Location: [place] [facility];  Service: Pulmonary;;  . COLONOSCOPY     multiple  . KNEE SURGERY  07/20/2008   bilat.  Marland Kitchen NASAL SEPTUM SURGERY  1995  . TONSILLECTOMY    . TRANSURETHRAL RESECTION OF BLADDER TUMOR  2002  . UPPER GASTROINTESTINAL ENDOSCOPY    . VIDEO BRONCHOSCOPY WITH ENDOBRONCHIAL NAVIGATION N/A 10/13/2019   Procedure: VIDEO BRONCHOSCOPY WITH ENDOBRONCHIAL NAVIGATION;  Surgeon: Garner Nash, DO;  Location: [facility];  Service: Pulmonary;  Laterality: N/A;  . WISDOM TOOTH EXTRACTION      MEDICATIONS: . amLODipine (NORVASC) 5 MG tablet  . atorvastatin (LIPITOR) 40 MG tablet  . CANNABIDIOL PO  . ibuprofen (ADVIL,MOTRIN) 200 MG tablet  . Misc Natural Products (PROSTATE SUPPORT PO)  . Multiple Vitamins-Minerals (PRESERVISION AREDS 2 PO)  . nicotine polacrilex (NICORETTE) 2 MG gum  . nicotine polacrilex (NICORETTE) 4 MG gum  . omeprazole (PRILOSEC) 40 MG capsule  . varenicline (CHANTIX STARTING MONTH PAK) 0.5 MG X 11 & 1 MG X 42 tablet   . 0.9 %  sodium chloride infusion    If CXR acceptable, I anticipate pt can proceed with surgery as scheduled.  Willeen Cass, PhD, FNP-BC [facility] Short Stay Surgical Center/Anesthesiology Phone: [phone] 12/14/2019 12:47 PM

## 2019-12-16 ENCOUNTER — Other Ambulatory Visit: Payer: Self-pay

## 2019-12-16 ENCOUNTER — Inpatient Hospital Stay (HOSPITAL_COMMUNITY): Payer: Medicare Other

## 2019-12-16 ENCOUNTER — Inpatient Hospital Stay (HOSPITAL_COMMUNITY): Payer: Medicare Other | Admitting: Emergency Medicine

## 2019-12-16 ENCOUNTER — Encounter (HOSPITAL_COMMUNITY): Payer: Self-pay | Admitting: Thoracic Surgery (Cardiothoracic Vascular Surgery)

## 2019-12-16 ENCOUNTER — Encounter (HOSPITAL_COMMUNITY)
Admission: RE | Disposition: A | Discharge status: Home / Self Care | Payer: Self-pay | Source: Home / Self Care | Attending: Thoracic Surgery (Cardiothoracic Vascular Surgery)

## 2019-12-16 ENCOUNTER — Inpatient Hospital Stay (HOSPITAL_COMMUNITY)
Admission: RE | Admit: 2019-12-16 | Discharge: 2019-12-18 | DRG: 164 | Disposition: A | Discharge status: Home / Self Care | Payer: Medicare Other | Attending: Thoracic Surgery (Cardiothoracic Vascular Surgery) | Admitting: Thoracic Surgery (Cardiothoracic Vascular Surgery)

## 2019-12-16 DIAGNOSIS — C3491 Malignant neoplasm of unspecified part of right bronchus or lung: Secondary | ICD-10-CM | POA: Diagnosis not present

## 2019-12-16 DIAGNOSIS — Z79899 Other long term (current) drug therapy: Secondary | ICD-10-CM

## 2019-12-16 DIAGNOSIS — Z8551 Personal history of malignant neoplasm of bladder: Secondary | ICD-10-CM

## 2019-12-16 DIAGNOSIS — F17210 Nicotine dependence, cigarettes, uncomplicated: Secondary | ICD-10-CM | POA: Diagnosis present

## 2019-12-16 DIAGNOSIS — J439 Emphysema, unspecified: Secondary | ICD-10-CM | POA: Diagnosis not present

## 2019-12-16 DIAGNOSIS — K2100 Gastro-esophageal reflux disease with esophagitis, without bleeding: Secondary | ICD-10-CM | POA: Diagnosis present

## 2019-12-16 DIAGNOSIS — Z4682 Encounter for fitting and adjustment of non-vascular catheter: Secondary | ICD-10-CM | POA: Diagnosis not present

## 2019-12-16 DIAGNOSIS — Z902 Acquired absence of lung [part of]: Secondary | ICD-10-CM

## 2019-12-16 DIAGNOSIS — C771 Secondary and unspecified malignant neoplasm of intrathoracic lymph nodes: Secondary | ICD-10-CM | POA: Diagnosis not present

## 2019-12-16 DIAGNOSIS — R918 Other nonspecific abnormal finding of lung field: Secondary | ICD-10-CM | POA: Diagnosis not present

## 2019-12-16 DIAGNOSIS — G4700 Insomnia, unspecified: Secondary | ICD-10-CM | POA: Diagnosis present

## 2019-12-16 DIAGNOSIS — Z9689 Presence of other specified functional implants: Secondary | ICD-10-CM

## 2019-12-16 DIAGNOSIS — J939 Pneumothorax, unspecified: Secondary | ICD-10-CM

## 2019-12-16 DIAGNOSIS — Z20822 Contact with and (suspected) exposure to covid-19: Secondary | ICD-10-CM | POA: Diagnosis present

## 2019-12-16 DIAGNOSIS — J449 Chronic obstructive pulmonary disease, unspecified: Secondary | ICD-10-CM | POA: Diagnosis present

## 2019-12-16 DIAGNOSIS — J95811 Postprocedural pneumothorax: Secondary | ICD-10-CM | POA: Diagnosis not present

## 2019-12-16 DIAGNOSIS — Z833 Family history of diabetes mellitus: Secondary | ICD-10-CM | POA: Diagnosis not present

## 2019-12-16 DIAGNOSIS — R1900 Intra-abdominal and pelvic swelling, mass and lump, unspecified site: Secondary | ICD-10-CM | POA: Diagnosis not present

## 2019-12-16 DIAGNOSIS — K5900 Constipation, unspecified: Secondary | ICD-10-CM | POA: Diagnosis not present

## 2019-12-16 DIAGNOSIS — C3412 Malignant neoplasm of upper lobe, left bronchus or lung: Secondary | ICD-10-CM

## 2019-12-16 DIAGNOSIS — R300 Dysuria: Secondary | ICD-10-CM | POA: Diagnosis present

## 2019-12-16 DIAGNOSIS — H3530 Unspecified macular degeneration: Secondary | ICD-10-CM | POA: Diagnosis present

## 2019-12-16 DIAGNOSIS — E785 Hyperlipidemia, unspecified: Secondary | ICD-10-CM | POA: Diagnosis not present

## 2019-12-16 DIAGNOSIS — Z8249 Family history of ischemic heart disease and other diseases of the circulatory system: Secondary | ICD-10-CM | POA: Diagnosis not present

## 2019-12-16 DIAGNOSIS — I10 Essential (primary) hypertension: Secondary | ICD-10-CM | POA: Diagnosis not present

## 2019-12-16 DIAGNOSIS — J9811 Atelectasis: Secondary | ICD-10-CM | POA: Diagnosis not present

## 2019-12-16 DIAGNOSIS — I2510 Atherosclerotic heart disease of native coronary artery without angina pectoris: Secondary | ICD-10-CM | POA: Diagnosis present

## 2019-12-16 HISTORY — PX: NODE DISSECTION: SHX5269

## 2019-12-16 HISTORY — PX: INTERCOSTAL NERVE BLOCK: SHX5021

## 2019-12-16 LAB — ABO/RH: ABO/RH(D): O POS

## 2019-12-16 SURGERY — LOBECTOMY, LUNG, ROBOT-ASSISTED, USING VATS
Anesthesia: General | Site: Chest | Laterality: Left

## 2019-12-16 MED ORDER — EPHEDRINE SULFATE 50 MG/ML IJ SOLN
INTRAMUSCULAR | Status: DC | PRN
  Administered 2019-12-16: 5 mg via INTRAVENOUS
  Administered 2019-12-16: 10 mg via INTRAVENOUS

## 2019-12-16 MED ORDER — ATORVASTATIN CALCIUM 10 MG PO TABS
20.0000 mg | ORAL_TABLET | Freq: Every day | ORAL | Status: DC
  Administered 2019-12-17 – 2019-12-18 (×2): 20 mg via ORAL
  Filled 2019-12-16 (×2): qty 2

## 2019-12-16 MED ORDER — MIDAZOLAM HCL 5 MG/5ML IJ SOLN
INTRAMUSCULAR | Status: DC | PRN
  Administered 2019-12-16: 1 mg via INTRAVENOUS

## 2019-12-16 MED ORDER — ALBUMIN HUMAN 5 % IV SOLN: INTRAVENOUS | Status: DC | PRN

## 2019-12-16 MED ORDER — ROCURONIUM BROMIDE 10 MG/ML (PF) SYRINGE
PREFILLED_SYRINGE | INTRAVENOUS | Status: DC | PRN
  Administered 2019-12-16 (×3): 50 mg via INTRAVENOUS
  Administered 2019-12-16: 20 mg via INTRAVENOUS

## 2019-12-16 MED ORDER — MORPHINE SULFATE (PF) 2 MG/ML IV SOLN: 2.0000 mg | INTRAVENOUS | Status: DC | PRN

## 2019-12-16 MED ORDER — ORAL CARE MOUTH RINSE: 15.0000 mL | Freq: Once | OROMUCOSAL | Status: AC

## 2019-12-16 MED ORDER — LUNG SURGERY BOOK
Freq: Once | Status: AC
  Administered 2019-12-16: 1
  Filled 2019-12-16: qty 1

## 2019-12-16 MED ORDER — SODIUM CHLORIDE 0.9 % IV SOLN: INTRAVENOUS | Status: DC | PRN

## 2019-12-16 MED ORDER — CALCIUM CHLORIDE 10 % IV SOLN
INTRAVENOUS | Status: AC
  Filled 2019-12-16: qty 10

## 2019-12-16 MED ORDER — LIDOCAINE 2% (20 MG/ML) 5 ML SYRINGE
INTRAMUSCULAR | Status: DC | PRN
  Administered 2019-12-16: 80 mg via INTRAVENOUS

## 2019-12-16 MED ORDER — FENTANYL CITRATE (PF) 100 MCG/2ML IJ SOLN
INTRAMUSCULAR | Status: DC | PRN
Start: 2019-12-16 — End: 2019-12-16
  Administered 2019-12-16 (×2): 50 ug via INTRAVENOUS
  Administered 2019-12-16: 75 ug via INTRAVENOUS
  Administered 2019-12-16: 25 ug via INTRAVENOUS
  Administered 2019-12-16: 50 ug via INTRAVENOUS

## 2019-12-16 MED ORDER — ACETAMINOPHEN 160 MG/5ML PO SOLN: 1000.0000 mg | Freq: Four times a day (QID) | ORAL | Status: DC

## 2019-12-16 MED ORDER — SODIUM CHLORIDE FLUSH 0.9 % IV SOLN
INTRAVENOUS | Status: DC | PRN
  Administered 2019-12-16: 100 mL

## 2019-12-16 MED ORDER — LACTATED RINGERS IV SOLN: INTRAVENOUS | Status: DC

## 2019-12-16 MED ORDER — KETOROLAC TROMETHAMINE 30 MG/ML IJ SOLN
30.0000 mg | Freq: Four times a day (QID) | INTRAMUSCULAR | Status: DC | PRN
  Administered 2019-12-16: 30 mg via INTRAVENOUS
  Filled 2019-12-16: qty 1

## 2019-12-16 MED ORDER — CHLORHEXIDINE GLUCONATE 0.12 % MT SOLN: 15.0000 mL | Freq: Once | OROMUCOSAL | Status: AC

## 2019-12-16 MED ORDER — DEXAMETHASONE SODIUM PHOSPHATE 10 MG/ML IJ SOLN
INTRAMUSCULAR | Status: DC | PRN
  Administered 2019-12-16: 10 mg via INTRAVENOUS

## 2019-12-16 MED ORDER — DEXAMETHASONE SODIUM PHOSPHATE 10 MG/ML IJ SOLN
INTRAMUSCULAR | Status: AC
  Filled 2019-12-16: qty 1

## 2019-12-16 MED ORDER — ONDANSETRON HCL 4 MG/2ML IJ SOLN
4.0000 mg | Freq: Four times a day (QID) | INTRAMUSCULAR | Status: DC | PRN
  Administered 2019-12-16: 4 mg via INTRAVENOUS
  Filled 2019-12-16: qty 2

## 2019-12-16 MED ORDER — LIDOCAINE 2% (20 MG/ML) 5 ML SYRINGE
INTRAMUSCULAR | Status: AC
  Filled 2019-12-16: qty 5

## 2019-12-16 MED ORDER — FENTANYL CITRATE (PF) 100 MCG/2ML IJ SOLN: 25.0000 ug | INTRAMUSCULAR | Status: DC | PRN

## 2019-12-16 MED ORDER — SODIUM CHLORIDE 0.9 % IR SOLN
Status: DC | PRN
  Administered 2019-12-16: 1000 mL

## 2019-12-16 MED ORDER — CALCIUM CHLORIDE 10 % IV SOLN
INTRAVENOUS | Status: DC | PRN
  Administered 2019-12-16: 200 mg via INTRAVENOUS

## 2019-12-16 MED ORDER — BISACODYL 5 MG PO TBEC
10.0000 mg | DELAYED_RELEASE_TABLET | Freq: Every day | ORAL | Status: DC
  Administered 2019-12-17 – 2019-12-18 (×2): 10 mg via ORAL
  Filled 2019-12-16 (×2): qty 2

## 2019-12-16 MED ORDER — TRAMADOL HCL 50 MG PO TABS
50.0000 mg | ORAL_TABLET | Freq: Four times a day (QID) | ORAL | Status: DC | PRN
  Administered 2019-12-16 – 2019-12-17 (×2): 100 mg via ORAL
  Filled 2019-12-16: qty 2

## 2019-12-16 MED ORDER — CHLORHEXIDINE GLUCONATE 0.12 % MT SOLN
OROMUCOSAL | Status: AC
  Administered 2019-12-16: 15 mL via OROMUCOSAL
  Filled 2019-12-16: qty 15

## 2019-12-16 MED ORDER — CEFAZOLIN SODIUM-DEXTROSE 2-4 GM/100ML-% IV SOLN
2.0000 g | INTRAVENOUS | Status: AC
  Administered 2019-12-16: 2 g via INTRAVENOUS

## 2019-12-16 MED ORDER — ROCURONIUM BROMIDE 10 MG/ML (PF) SYRINGE
PREFILLED_SYRINGE | INTRAVENOUS | Status: AC
  Filled 2019-12-16: qty 10

## 2019-12-16 MED ORDER — BUPIVACAINE LIPOSOME 1.3 % IJ SUSP
20.0000 mL | Freq: Once | INTRAMUSCULAR | Status: DC
  Filled 2019-12-16: qty 20

## 2019-12-16 MED ORDER — LACTATED RINGERS IV SOLN: INTRAVENOUS | Status: DC | PRN

## 2019-12-16 MED ORDER — ONDANSETRON HCL 4 MG/2ML IJ SOLN
INTRAMUSCULAR | Status: DC | PRN
  Administered 2019-12-16: 4 mg via INTRAVENOUS

## 2019-12-16 MED ORDER — TRAMADOL HCL 50 MG PO TABS
ORAL_TABLET | ORAL | Status: AC
  Filled 2019-12-16: qty 2

## 2019-12-16 MED ORDER — IPRATROPIUM-ALBUTEROL 0.5-2.5 (3) MG/3ML IN SOLN
3.0000 mL | Freq: Two times a day (BID) | RESPIRATORY_TRACT | Status: DC
  Administered 2019-12-17 – 2019-12-18 (×3): 3 mL via RESPIRATORY_TRACT
  Filled 2019-12-16 (×3): qty 3

## 2019-12-16 MED ORDER — SODIUM CHLORIDE 0.9 % IV SOLN: INTRAVENOUS | Status: DC

## 2019-12-16 MED ORDER — AMLODIPINE BESYLATE 5 MG PO TABS
5.0000 mg | ORAL_TABLET | Freq: Every day | ORAL | Status: DC
  Administered 2019-12-17: 5 mg via ORAL
  Filled 2019-12-16: qty 1

## 2019-12-16 MED ORDER — ONDANSETRON HCL 4 MG/2ML IJ SOLN
INTRAMUSCULAR | Status: AC
  Filled 2019-12-16: qty 2

## 2019-12-16 MED ORDER — IPRATROPIUM-ALBUTEROL 0.5-2.5 (3) MG/3ML IN SOLN
3.0000 mL | Freq: Four times a day (QID) | RESPIRATORY_TRACT | Status: DC
  Administered 2019-12-16: 3 mL via RESPIRATORY_TRACT
  Filled 2019-12-16: qty 3

## 2019-12-16 MED ORDER — SUCCINYLCHOLINE CHLORIDE 200 MG/10ML IV SOSY
PREFILLED_SYRINGE | INTRAVENOUS | Status: AC
  Filled 2019-12-16: qty 10

## 2019-12-16 MED ORDER — 0.9 % SODIUM CHLORIDE (POUR BTL) OPTIME
TOPICAL | Status: DC | PRN
  Administered 2019-12-16: 2000 mL

## 2019-12-16 MED ORDER — GLYCOPYRROLATE 0.2 MG/ML IJ SOLN
INTRAMUSCULAR | Status: DC | PRN
  Administered 2019-12-16: .2 mg via INTRAVENOUS

## 2019-12-16 MED ORDER — PROPOFOL 10 MG/ML IV BOLUS
INTRAVENOUS | Status: DC | PRN
  Administered 2019-12-16: 150 mg via INTRAVENOUS
  Administered 2019-12-16 (×2): 20 mg via INTRAVENOUS

## 2019-12-16 MED ORDER — CEFAZOLIN SODIUM-DEXTROSE 2-4 GM/100ML-% IV SOLN
INTRAVENOUS | Status: AC
  Administered 2019-12-16: 2 g via INTRAVENOUS
  Filled 2019-12-16: qty 100

## 2019-12-16 MED ORDER — FENTANYL CITRATE (PF) 250 MCG/5ML IJ SOLN
INTRAMUSCULAR | Status: AC
  Filled 2019-12-16: qty 5

## 2019-12-16 MED ORDER — SUGAMMADEX SODIUM 200 MG/2ML IV SOLN
INTRAVENOUS | Status: DC | PRN
  Administered 2019-12-16: 400 mg via INTRAVENOUS

## 2019-12-16 MED ORDER — HEMOSTATIC AGENTS (NO CHARGE) OPTIME
TOPICAL | Status: DC | PRN
  Administered 2019-12-16: 2 via TOPICAL

## 2019-12-16 MED ORDER — SENNOSIDES-DOCUSATE SODIUM 8.6-50 MG PO TABS
1.0000 | ORAL_TABLET | Freq: Every day | ORAL | Status: DC
  Administered 2019-12-16 – 2019-12-17 (×2): 1 via ORAL
  Filled 2019-12-16 (×2): qty 1

## 2019-12-16 MED ORDER — CEFAZOLIN SODIUM-DEXTROSE 2-4 GM/100ML-% IV SOLN
2.0000 g | Freq: Three times a day (TID) | INTRAVENOUS | Status: AC
  Administered 2019-12-17: 2 g via INTRAVENOUS
  Filled 2019-12-16 (×2): qty 100

## 2019-12-16 MED ORDER — PHENYLEPHRINE HCL-NACL 10-0.9 MG/250ML-% IV SOLN
INTRAVENOUS | Status: DC | PRN
  Administered 2019-12-16: 25 ug/min via INTRAVENOUS

## 2019-12-16 MED ORDER — PROPOFOL 10 MG/ML IV BOLUS
INTRAVENOUS | Status: AC
  Filled 2019-12-16: qty 20

## 2019-12-16 MED ORDER — MIDAZOLAM HCL 2 MG/2ML IJ SOLN
INTRAMUSCULAR | Status: AC
  Filled 2019-12-16: qty 2

## 2019-12-16 MED ORDER — BUPIVACAINE HCL (PF) 0.5 % IJ SOLN
INTRAMUSCULAR | Status: AC
  Filled 2019-12-16: qty 30

## 2019-12-16 MED ORDER — ESMOLOL HCL 100 MG/10ML IV SOLN
INTRAVENOUS | Status: DC | PRN
  Administered 2019-12-16: 30 mg via INTRAVENOUS

## 2019-12-16 MED ORDER — ACETAMINOPHEN 500 MG PO TABS
1000.0000 mg | ORAL_TABLET | Freq: Four times a day (QID) | ORAL | Status: DC
  Administered 2019-12-16 – 2019-12-18 (×5): 1000 mg via ORAL
  Filled 2019-12-16 (×5): qty 2

## 2019-12-16 SURGICAL SUPPLY — 129 items
ADH SKN CLS APL DERMABOND .7 (GAUZE/BANDAGES/DRESSINGS) ×1
APL PRP STRL LF DISP 70% ISPRP (MISCELLANEOUS) ×1
BAG SPEC RTRVL C1550 15 (MISCELLANEOUS) ×1
BAG SPEC RTRVL LRG 6X4 10 (ENDOMECHANICALS)
BLADE CLIPPER SURG (BLADE) IMPLANT
BLADE SURG 11 STRL SS (BLADE) ×3 IMPLANT
BNDG COHESIVE 6X5 TAN STRL LF (GAUZE/BANDAGES/DRESSINGS) IMPLANT
CANISTER SUCT 3000ML PPV (MISCELLANEOUS) ×6 IMPLANT
CANNULA REDUC XI 12-8 STAPL (CANNULA) ×4
CANNULA REDUC XI 12-8MM STAPL (CANNULA) ×2
CANNULA REDUCER 12-8 DVNC XI (CANNULA) ×2 IMPLANT
CATH THORACIC 28FR (CATHETERS) ×3 IMPLANT
CATH THORACIC 28FR RT ANG (CATHETERS) IMPLANT
CATH THORACIC 36FR (CATHETERS) IMPLANT
CATH THORACIC 36FR RT ANG (CATHETERS) IMPLANT
CATH TROCAR 20FR (CATHETERS) IMPLANT
CHLORAPREP W/TINT 26 (MISCELLANEOUS) ×3 IMPLANT
CLIP VESOCCLUDE MED 6/CT (CLIP) IMPLANT
CNTNR URN SCR LID CUP LEK RST (MISCELLANEOUS) ×13 IMPLANT
CONN ST 1/4X3/8  BEN (MISCELLANEOUS)
CONN ST 1/4X3/8 BEN (MISCELLANEOUS) IMPLANT
CONN Y 3/8X3/8X3/8  BEN (MISCELLANEOUS)
CONN Y 3/8X3/8X3/8 BEN (MISCELLANEOUS) IMPLANT
CONT SPEC 4OZ STRL OR WHT (MISCELLANEOUS) ×39
COVER SURGICAL LIGHT HANDLE (MISCELLANEOUS) IMPLANT
DEFOGGER SCOPE WARMER CLEARIFY (MISCELLANEOUS) ×3 IMPLANT
DERMABOND ADVANCED (GAUZE/BANDAGES/DRESSINGS) ×2
DERMABOND ADVANCED .7 DNX12 (GAUZE/BANDAGES/DRESSINGS) ×1 IMPLANT
DISSECTOR BLUNT TIP ENDO 5MM (MISCELLANEOUS) IMPLANT
DRAIN CHANNEL 28F RND 3/8 FF (WOUND CARE) IMPLANT
DRAIN CHANNEL 32F RND 10.7 FF (WOUND CARE) IMPLANT
DRAPE ARM DVNC X/XI (DISPOSABLE) ×4 IMPLANT
DRAPE COLUMN DVNC XI (DISPOSABLE) ×1 IMPLANT
DRAPE CV SPLIT W-CLR ANES SCRN (DRAPES) ×3 IMPLANT
DRAPE DA VINCI XI ARM (DISPOSABLE) ×12
DRAPE DA VINCI XI COLUMN (DISPOSABLE) ×3
DRAPE HALF SHEET 40X57 (DRAPES) ×3 IMPLANT
DRAPE ORTHO SPLIT 77X108 STRL (DRAPES) ×3
DRAPE SURG ORHT 6 SPLT 77X108 (DRAPES) ×1 IMPLANT
DRAPE WARM FLUID 44X44 (DRAPES) IMPLANT
ELECT BLADE 6.5 EXT (BLADE) IMPLANT
ELECT REM PT RETURN 9FT ADLT (ELECTROSURGICAL) ×3
ELECTRODE REM PT RTRN 9FT ADLT (ELECTROSURGICAL) ×1 IMPLANT
GAUZE KITTNER 4X10 (MISCELLANEOUS) ×3 IMPLANT
GAUZE KITTNER 4X5 RF (MISCELLANEOUS) ×3 IMPLANT
GAUZE KITTNER 4X8 (MISCELLANEOUS) IMPLANT
GAUZE SPONGE 4X4 12PLY STRL (GAUZE/BANDAGES/DRESSINGS) ×3 IMPLANT
GLOVE BIO SURGEON STRL SZ7.5 (GLOVE) ×6 IMPLANT
GLOVE SURG SS PI 7.5 STRL IVOR (GLOVE) ×3 IMPLANT
GLOVE SURG SS PI 8.0 STRL IVOR (GLOVE) ×3 IMPLANT
GOWN STRL REUS W/ TWL LRG LVL3 (GOWN DISPOSABLE) ×3 IMPLANT
GOWN STRL REUS W/ TWL XL LVL3 (GOWN DISPOSABLE) ×2 IMPLANT
GOWN STRL REUS W/TWL 2XL LVL3 (GOWN DISPOSABLE) ×3 IMPLANT
GOWN STRL REUS W/TWL LRG LVL3 (GOWN DISPOSABLE) ×9
GOWN STRL REUS W/TWL XL LVL3 (GOWN DISPOSABLE) ×6
HEMOSTAT SURGICEL 2X14 (HEMOSTASIS) ×6 IMPLANT
IRRIGATION STRYKERFLOW (MISCELLANEOUS) ×1 IMPLANT
IRRIGATOR STRYKERFLOW (MISCELLANEOUS) ×3
KIT BASIN OR (CUSTOM PROCEDURE TRAY) ×3 IMPLANT
KIT SUCTION CATH 14FR (SUCTIONS) IMPLANT
KIT TURNOVER KIT B (KITS) ×3 IMPLANT
LOOP VESSEL SUPERMAXI WHITE (MISCELLANEOUS) IMPLANT
NEEDLE 22X1 1/2 (OR ONLY) (NEEDLE) ×3 IMPLANT
NS IRRIG 1000ML POUR BTL (IV SOLUTION) ×9 IMPLANT
PACK CHEST (CUSTOM PROCEDURE TRAY) ×3 IMPLANT
PAD ARMBOARD 7.5X6 YLW CONV (MISCELLANEOUS) ×15 IMPLANT
PORT ACCESS TROCAR AIRSEAL 12 (TROCAR) ×1 IMPLANT
PORT ACCESS TROCAR AIRSEAL 5M (TROCAR) ×2
POUCH ENDO CATCH II 15MM (MISCELLANEOUS) IMPLANT
POUCH SPECIMEN RETRIEVAL 10MM (ENDOMECHANICALS) IMPLANT
RELOAD STAPLER 2.5X45 WHT DVNC (STAPLE) ×5 IMPLANT
RELOAD STAPLER 3.5X45 BLU DVNC (STAPLE) ×3 IMPLANT
RELOAD STAPLER 4.3X45 GRN DVNC (STAPLE) ×2 IMPLANT
RETRACTOR WOUND ALXS 19CM XSML (INSTRUMENTS) IMPLANT
RTRCTR WOUND ALEXIS 19CM XSML (INSTRUMENTS)
SCISSORS LAP 5X35 DISP (ENDOMECHANICALS) IMPLANT
SEAL CANN UNIV 5-8 DVNC XI (MISCELLANEOUS) ×2 IMPLANT
SEAL XI 5MM-8MM UNIVERSAL (MISCELLANEOUS) ×6
SEALANT PROGEL (MISCELLANEOUS) IMPLANT
SEALANT SURG COSEAL 4ML (VASCULAR PRODUCTS) IMPLANT
SEALANT SURG COSEAL 8ML (VASCULAR PRODUCTS) IMPLANT
SEALER LIGASURE MARYLAND 30 (ELECTROSURGICAL) IMPLANT
SET TRI-LUMEN FLTR TB AIRSEAL (TUBING) ×3 IMPLANT
SOLUTION ELECTROLUBE (MISCELLANEOUS) ×3 IMPLANT
SPONGE INTESTINAL PEANUT (DISPOSABLE) IMPLANT
SPONGE TONSIL TAPE 1 RFD (DISPOSABLE) IMPLANT
STAPLE RELOAD 45 2.0 GRAY (STAPLE) ×3
STAPLE RELOAD 45 2.0 GRAY DVNC (STAPLE) ×1 IMPLANT
STAPLER 45 SUREFORM CVD (STAPLE) ×3
STAPLER 45 SUREFORM CVD DVNC (STAPLE) ×1 IMPLANT
STAPLER CANNULA SEAL DVNC XI (STAPLE) ×2 IMPLANT
STAPLER CANNULA SEAL XI (STAPLE) ×6
STAPLER RELOAD 2.5X45 WHITE (STAPLE) ×15
STAPLER RELOAD 2.5X45 WHT DVNC (STAPLE) ×5
STAPLER RELOAD 3.5X45 BLU DVNC (STAPLE) ×3
STAPLER RELOAD 3.5X45 BLUE (STAPLE) ×9
STAPLER RELOAD 4.3X45 GREEN (STAPLE) ×6
STAPLER RELOAD 4.3X45 GRN DVNC (STAPLE) ×2
STOPCOCK 4 WAY LG BORE MALE ST (IV SETS) ×3 IMPLANT
SUT MNCRL AB 3-0 PS2 18 (SUTURE) IMPLANT
SUT MON AB 2-0 CT1 36 (SUTURE) IMPLANT
SUT PDS AB 1 CTX 36 (SUTURE) IMPLANT
SUT PROLENE 4 0 RB 1 (SUTURE)
SUT PROLENE 4-0 RB1 .5 CRCL 36 (SUTURE) IMPLANT
SUT SILK  1 MH (SUTURE) ×6
SUT SILK 1 MH (SUTURE) ×2 IMPLANT
SUT SILK 1 TIES 10X30 (SUTURE) IMPLANT
SUT SILK 2 0 SH (SUTURE) ×6 IMPLANT
SUT SILK 2 0SH CR/8 30 (SUTURE) IMPLANT
SUT VIC AB 1 CTX 36 (SUTURE)
SUT VIC AB 1 CTX36XBRD ANBCTR (SUTURE) IMPLANT
SUT VIC AB 2-0 CT1 27 (SUTURE) ×3
SUT VIC AB 2-0 CT1 TAPERPNT 27 (SUTURE) ×1 IMPLANT
SUT VIC AB 3-0 SH 27 (SUTURE) ×6
SUT VIC AB 3-0 SH 27X BRD (SUTURE) ×2 IMPLANT
SUT VICRYL 0 TIES 12 18 (SUTURE) ×3 IMPLANT
SUT VICRYL 0 UR6 27IN ABS (SUTURE) ×6 IMPLANT
SUT VICRYL 2 TP 1 (SUTURE) IMPLANT
SYR 10ML LL (SYRINGE) ×3 IMPLANT
SYR 20ML LL LF (SYRINGE) ×3 IMPLANT
SYR 50ML LL SCALE MARK (SYRINGE) ×3 IMPLANT
SYSTEM RETRIEVAL ANCHOR 15 (MISCELLANEOUS) ×3 IMPLANT
SYSTEM SAHARA CHEST DRAIN ATS (WOUND CARE) ×3 IMPLANT
TAPE CLOTH 4X10 WHT NS (GAUZE/BANDAGES/DRESSINGS) ×3 IMPLANT
TAPE CLOTH SURG 4X10 WHT LF (GAUZE/BANDAGES/DRESSINGS) ×3 IMPLANT
TIP APPLICATOR SPRAY EXTEND 16 (VASCULAR PRODUCTS) IMPLANT
TOWEL GREEN STERILE (TOWEL DISPOSABLE) ×3 IMPLANT
TRAY FOLEY MTR SLVR 16FR STAT (SET/KITS/TRAYS/PACK) ×3 IMPLANT
TUBING EXTENTION W/L.L. (IV SETS) ×3 IMPLANT

## 2019-12-16 NOTE — H&P (Signed)
[address]       [place] [address]             [phone]       No events since his last clinic appointment 69 yo male with left upper lobe adenocarcinoma OR today for left robotic assisted left upper lobectomy  Per my last clinic note.  [facility] Record #[id number] Date of Birth: 1951/02/16  Referring: Garner Nash, DO Primary Care: [name], MD Primary Cardiologist: No primary care provider on file.  Chief Complaint:        Chief Complaint  Patient presents with  . Lung Lesion    Surgical consult    History of Present Illness:    Jesse [name] 69 y.o. male 69 year old gentleman referred by Dr. 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  He has a history of tobacco use and was in our lung cancer screening program and noted interval increase in size of this lesion from 4 mm.  PET scan showed metabolic activity with an SUV of 2.6 and he subsequently underwent a navigational bronchoscopic biopsy which showed adenocarcinoma.  Incidentally he was noted to have a pelvic mass between his bladder and colon on the PET/CT and this is currently being evaluated as well.  He does endorse some dysuria and some constipation.  His weight has been stable for the most part.  He continues to remain very active with excellent exercise tolerance.    Smoking Hx: Quit tobacco 2 and half weeks ago.   Zubrod Score: At the time of surgery this patient's most appropriate activity status/level should be described as: [x] ?    0    Normal activity, no symptoms [] ?    1    Restricted in physical strenuous activity but ambulatory, able to do out light work [] ?    2    Ambulatory and capable of self care, unable to do work activities, up and about               >50 % of waking hours                              [] ?    3    Only limited self care, in bed greater than 50% of waking hours [] ?     4    Completely disabled, no self care, confined to bed or chair [] ?    5    Moribund       Past Medical History:  Diagnosis Date  . Anemia   . Cancer [facility])    bladder cancer  . COPD (chronic obstructive pulmonary disease) (HCC)    mild   . DIVERTICULOSIS, COLON 01/14/2007   Qualifier: Diagnosis of  By: Paulina Fusi RN, [name]   . GASTRITIS, CHRONIC 06/01/2004   Qualifier: Diagnosis of  By: Nolon Rod CMA (AAMA), Robin    . GERD 01/09/2007   Qualifier: Diagnosis of  By: Scherrie Gerlach    . HYPERLIPIDEMIA 01/09/2007   Qualifier: Diagnosis of  By: Scherrie Gerlach    . Hypertension   . Lung mass    left upper nodule  . Macular degeneration    right eye  . NEOPLASM, MALIGNANT, BLADDER, HX OF 2002   Qualifier: Diagnosis of  By: Nolon Rod CMA (AAMA), Robin  / no chemo or radiation  .  OSTEOARTHRITIS 01/14/2007   Qualifier: Diagnosis of  By: Paulina Fusi RN, [name] - knees  . Rectal mass   . Reflux esophagitis 06/01/2004   Qualifier: Diagnosis of  By: Nolon Rod CMA (AAMA), Robin    . TOBACCO USER 02/16/2009   Qualifier: Diagnosis of  By: Burnice Logan  MD, Doretha Sou   . VITAMIN D DEFICIENCY 06/03/2007   Qualifier: Diagnosis of  By: Burnice Logan  MD, Doretha Sou          Past Surgical History:  Procedure Laterality Date  . BRONCHIAL BIOPSY  10/13/2019   Procedure: BRONCHIAL BIOPSIES;  Surgeon: Garner Nash, DO;  Location: [facility];  Service: Pulmonary;;  . BRONCHIAL BRUSHINGS  10/13/2019   Procedure: BRONCHIAL BRUSHINGS;  Surgeon: Garner Nash, DO;  Location: [facility];  Service: Pulmonary;;  . BRONCHIAL NEEDLE ASPIRATION BIOPSY  10/13/2019   Procedure: BRONCHIAL NEEDLE ASPIRATION BIOPSIES;  Surgeon: Garner Nash, DO;  Location: [facility];  Service: Pulmonary;;  . BRONCHIAL WASHINGS  10/13/2019   Procedure: BRONCHIAL WASHINGS;  Surgeon: Garner Nash, DO;  Location: [facility];  Service: Pulmonary;;  . COLONOSCOPY     multiple  . KNEE  SURGERY  07/20/2008   bilat.  Marland Kitchen NASAL SEPTUM SURGERY  1995  . TONSILLECTOMY    . TRANSURETHRAL RESECTION OF BLADDER TUMOR  2002  . UPPER GASTROINTESTINAL ENDOSCOPY    . VIDEO BRONCHOSCOPY WITH ENDOBRONCHIAL NAVIGATION N/A 10/13/2019   Procedure: VIDEO BRONCHOSCOPY WITH ENDOBRONCHIAL NAVIGATION;  Surgeon: Garner Nash, DO;  Location: [facility];  Service: Pulmonary;  Laterality: N/A;  . WISDOM TOOTH EXTRACTION           Family History  Problem Relation Age of Onset  . Dementia Mother   . Diabetes Father   . Mental illness Father   . Peripheral vascular disease Brother      Social History       Tobacco Use  Smoking Status Current Every Day Smoker  . Years: 40.00  . Types: Cigarettes  Smokeless Tobacco Never Used  Tobacco Comment   8 -10 cigarettes daily, cut back last month 08/2019, now 4-5 cig per day    Social History       Substance and Sexual Activity  Alcohol Use Yes  . Alcohol/week: 15.0 standard drinks  . Types: 15 Standard drinks or equivalent per week   Comment: wine/beer/martini     No Known Allergies        Current Outpatient Medications  Medication Sig Dispense Refill  . amLODipine (NORVASC) 5 MG tablet Take 1 tablet (5 mg total) by mouth daily. 90 tablet 3  . atorvastatin (LIPITOR) 40 MG tablet TAKE 1 TABLET BY MOUTH  DAILY AT 6PM 90 tablet 3  . ibuprofen (ADVIL,MOTRIN) 200 MG tablet Take 800 mg by mouth every 8 (eight) hours as needed for moderate pain.     . nicotine polacrilex (NICORETTE) 4 MG gum Take 1 each (4 mg total) by mouth as needed for smoking cessation. 100 tablet 1  . omeprazole (PRILOSEC) 40 MG capsule Take 1 capsule (40 mg total) by mouth daily. 90 capsule 3  . Specialty Vitamins Products (PROSTATE PO) Take 2 capsules by mouth daily.     . tadalafil (CIALIS) 20 MG tablet Take 1 tablet (20 mg total) by mouth daily as needed for erectile dysfunction. 6 tablet 6  . varenicline (CHANTIX STARTING MONTH  PAK) 0.5 MG X 11 & 1 MG X 42 tablet Take one 0.5 mg tablet by mouth once  daily for 3 days, then increase to one 0.5 mg tablet twice daily for 4 days, then increase to one 1 mg tablet twice daily. 53 tablet 0            Current Facility-Administered Medications  Medication Dose Route Frequency Provider Last Rate Last Admin  . 0.9 %  sodium chloride infusion  500 mL Intravenous Continuous Gatha Mayer, MD        Review of Systems  Constitutional: Negative.   Respiratory: Negative.   Cardiovascular: Negative.   Gastrointestinal: Positive for constipation.  Genitourinary: Positive for dysuria.     PHYSICAL EXAMINATION: BP (!) 144/78   Pulse 82   Temp 97.9 F (36.6 C) (Temporal)   Resp 20   Ht 5\' 9"  (1.753 m)   Wt 176 lb (79.8 kg)   SpO2 98%   BMI 25.99 kg/m  Physical Exam Constitutional:      Appearance: Normal appearance.  HENT:     Head: Normocephalic and atraumatic.  Cardiovascular:     Rate and Rhythm: Normal rate and regular rhythm.     Pulses: Normal pulses.     Heart sounds: No murmur heard.   Pulmonary:     Effort: Pulmonary effort is normal. No respiratory distress.  Abdominal:     General: Abdomen is flat.     Palpations: Abdomen is soft.  Musculoskeletal:        General: Normal range of motion.  Neurological:     General: No focal deficit present.     Mental Status: He is alert and oriented to person, place, and time.     Diagnostic Studies & Laboratory data:     Recent Radiology Findings:   CT Abdomen Pelvis W Contrast  Result Date: 10/09/2019 CLINICAL DATA:  Mass or lump.  Hypermetabolic mass on PET scan. EXAM: CT ABDOMEN AND PELVIS WITH CONTRAST TECHNIQUE: Multidetector CT imaging of the abdomen and pelvis was performed using the standard protocol following bolus administration of intravenous contrast. CONTRAST:  110mL ISOVUE-300 IOPAMIDOL (ISOVUE-300) INJECTION 61% COMPARISON:  PET-CT 09/16/2019 FINDINGS: Lower chest: Lung bases are clear.  Hepatobiliary: No focal hepatic lesion.  Gallbladder normal. Pancreas: Pancreas is normal. No ductal dilatation. No pancreatic inflammation. Spleen: Normal spleen Adrenals/urinary tract: Adrenal glands and kidneys are normal. The ureters and bladder normal. Stomach/Bowel: Stomach, duodenum and small bowel normal. Ascending transverse colon normal. Several diverticula of the sigmoid colon. Anterior to the rectum and inseparable from the anterior rectal wall is a rounded 5.2 x 3.9 cm mass (image 78/2. This mass is peripherally enhancing and has central low attenuation suggestive of necrosis. This mass was intensely hypermetabolic on comparison PET-CT scan. The mass abuts the posterior wall of the prostate gland also with no discernible flap pain. Lesion is removed from the bladder. This mass is very low (at the level of the internal anal sphincter) and certainly palpable by digital rectal exam. Favor mass originate from the anterior wall the rectum or anus. Secondary differential would be a large necrotic lymph node which is less favored. Vascular/Lymphatic: Abdominal aorta is normal caliber. No periportal or retroperitoneal adenopathy. No pelvic adenopathy. Reproductive: Prostate gland grossly normal. Prostate gland abuts the above-described mass on posterior wall. There is mild nodular indentation into the base the bladder typical of benign prostate hypertrophy. Other: No free fluid. Musculoskeletal: No aggressive osseous lesion. IMPRESSION: 1. Unusual large peripheral enhancing mass position between the anterior wall the rectum and posterior wall of the prostate gland. Mass has central necrosis and  was intensely hypermetabolic on comparison FDG PET scan consistent with malignancy. Favor mass originating from the anus versus anterior wall of the rectum. Favor ANAL CARCINOMA. Mass is very low at the level of the internal anal sphincter. Recommend GI consultation for tissue sampling. 2. No evidence of metastatic  disease in the abdomen pelvis. No lymphadenopathy. These results will be called to the ordering clinician or representative by the Radiologist Assistant, and communication documented in the PACS or [employer]. Electronically Signed   By: Suzy Bouchard M.D.   On: 10/09/2019 09:14   DG CHEST PORT 1 VIEW  Result Date: 10/13/2019 CLINICAL DATA:  Status post bronchoscopy. EXAM: PORTABLE CHEST 1 VIEW COMPARISON:  October 03, 2004. FINDINGS: The heart size and mediastinal contours are within normal limits. Both lungs are clear. No pneumothorax or pleural effusion is noted. The visualized skeletal structures are unremarkable. IMPRESSION: No active disease. Electronically Signed   By: Marijo Conception M.D.   On: 10/13/2019 11:54   CT Super D Chest Wo Contrast  Result Date: 10/07/2019 CLINICAL DATA:  69 year old male with history of left upper lobe pulmonary nodule. Followup study. EXAM: CT CHEST WITHOUT CONTRAST TECHNIQUE: Multidetector CT imaging of the chest was performed using thin slice collimation for electromagnetic bronchoscopy planning purposes, without intravenous contrast. COMPARISON:  Multiple priors, most recently PET-CT 09/16/2019 and chest CT 09/02/2019. FINDINGS: Cardiovascular: Heart size is normal. There is no significant pericardial fluid, thickening or pericardial calcification. There is aortic atherosclerosis, as well as atherosclerosis of the great vessels of the mediastinum and the coronary arteries, including calcified atherosclerotic plaque in the left main, left anterior descending, left circumflex and right coronary arteries. Calcifications of the aortic valve. 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 Mediastinum/Nodes: No pathologically enlarged mediastinal or hilar lymph nodes. Please note that accurate exclusion of hilar adenopathy is limited on noncontrast CT scans. Esophagus is unremarkable in appearance. No axillary lymphadenopathy. Lungs/Pleura: Nodule of  concern in the left upper lobe appears similar to the prior examinations, currently measuring 1.2 x 0.8 x 0.5 cm (axial image 86 of series 3 and coronal image 38 of series 5). A few other scattered small pulmonary nodules are noted, most notably in the apex of the right upper lobe where these measure up to 5 mm (axial image 24 of series 3), similar to prior examination. No other larger more suspicious appearing pulmonary nodules or masses are noted. No acute consolidative airspace disease. No pleural effusions. Mild diffuse bronchial wall thickening with mild centrilobular and paraseptal emphysema. Upper Abdomen: Aortic atherosclerosis. Musculoskeletal: There are no aggressive appearing lytic or blastic lesions noted in the visualized portions of the skeleton. IMPRESSION: 1. 1.2 x 0.8 x 0.5 cm left upper lobe nodule appears similar to the prior examination. Given the hypermetabolism on the prior PET-CT, this remains concerning for potential primary bronchogenic neoplasm. 2. Mild diffuse bronchial wall thickening with mild centrilobular and paraseptal emphysema; imaging findings suggestive of underlying COPD. 3. Aortic atherosclerosis, in addition to left main and 3 vessel coronary artery disease. Please note that although the presence of coronary artery calcium documents the presence of coronary artery disease, the severity of this disease and any potential stenosis cannot be assessed on this non-gated CT examination. Assessment for potential risk factor modification, dietary therapy or pharmacologic therapy may be warranted, if clinically indicated. 4. There are calcifications of the aortic valve. Echocardiographic correlation for evaluation of potential valvular dysfunction may be warranted if clinically indicated. 5. 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 Recommend annual imaging followup by CTA or MRA. This recommendation follows 2010 ACCF/AHA/AATS/ACR/ASA/SCA/SCAI/SIR/STS/SVM Guidelines  for the Diagnosis and Management of Patients with Thoracic Aortic Disease. Circulation. 2010; 121: I203-T597. Aortic aneurysm NOS (ICD10-I71.9). Aortic Atherosclerosis (ICD10-I70.0) and Emphysema (ICD10-J43.9). Electronically Signed   By: Vinnie Langton M.D.   On: 10/07/2019 10:05   DG C-ARM BRONCHOSCOPY  Result Date: 10/13/2019 C-ARM BRONCHOSCOPY: Fluoroscopy was utilized by the requesting physician.  No radiographic interpretation.       I have independently reviewed the above radiology studies  and reviewed the findings with the patient.   Recent Lab Findings:      Lab Results  Component Value Date   WBC 5.3 10/08/2019   HGB 14.2 10/08/2019   HCT 41.4 10/08/2019   PLT 127.0 (L) 10/08/2019   GLUCOSE 130 (H) 10/08/2019   CHOL 191 10/22/2018   TRIG 211.0 (H) 10/22/2018   HDL 42.50 10/22/2018   LDLDIRECT 102.0 10/22/2018   ALT 13 10/08/2019   AST 18 10/08/2019   NA 138 10/08/2019   K 4.1 10/08/2019   CL 104 10/08/2019   CREATININE 0.79 10/08/2019   BUN 21 10/08/2019   CO2 28 10/08/2019   TSH 1.23 10/08/2017   INR 0.9 10/08/2019   HGBA1C 5.8 10/22/2018     PFTs: - FVC: 113% - FEV1: 99% -DLCO: 107%  Problem List: 7 mm left upper lobe adenocarcinoma Pelvic mass History of bladder cancer  Assessment / Plan:   This is a 69 year old male with a 7 mm left upper lobe adenocarcinoma the lung.  Additionally he also has a newly discovered pelvic mass that is currently undergoing diagnosis.  Based off of his pulmonary function, and exercise tolerance he would be a good surgical candidate and is agreeable to proceed with a left robotic assisted thoracoscopy and left upper lobectomy.  He would like to undergo further evaluation of this pelvic mass prior to surgery, and will schedule for this operation with [place] within the next month.

## 2019-12-16 NOTE — Transfer of Care (Signed)
Immediate Anesthesia Transfer of Care Note  Patient: Jesse [name]  Procedure(s) Performed: XI ROBOTIC ASSISTED THORASCOPY-LEFT UPPER LOBECTOMY (Left Chest) NODE DISSECTION (Left Chest) INTERCOSTAL NERVE BLOCK (Left Chest)  Patient Location: PACU  Anesthesia Type:General  Level of Consciousness: drowsy  Airway & Oxygen Therapy: Patient Spontanous Breathing and Patient connected to face mask oxygen  Post-op Assessment: Report given to RN, Post -op Vital signs reviewed and stable and Patient moving all extremities  Post vital signs: Reviewed and stable  Last Vitals:  Vitals Value Taken Time  BP 143/76 12/16/19 1226  Temp 36.6 C 12/16/19 1225  Pulse 79 12/16/19 1229  Resp 21 12/16/19 1229  SpO2 100 % 12/16/19 1229  Vitals shown include unvalidated device data.  Last Pain:  Vitals:   12/16/19 0701  TempSrc:   PainSc: 2          Complications: No complications documented.

## 2019-12-16 NOTE — Op Note (Signed)
      [address]       [place] [address]             [phone]        12/16/2019  Patient:  Jesse [name] Pre-Op Dx: Left upper lobe adenocarcinoma   Post-op Dx:  sam Procedure: - Robotic assisted left video thoracoscopy - left upper lobectomy - Mediastinal lymph node sampling - Intercostal nerve block  Surgeon and Role:      * Jassmin Kemmerer, Lucile Crater, MD - Primary    * M. Roddenberry, PA-C - assisting  Anesthesia  general EBL:  100 ml Blood Administration: none Specimen:  Left upper lobe.  Station 6, 7, 10, 11, and hilar nodes  Drains: 56 F argyle chest tube in left chest Counts: correct   Indications: This is a 69 year old male with a 7 mm left upper lobe adenocarcinoma the lung. Additionally he also has a newly discovered pelvic mass that is currently undergoing diagnosis. Based off of his pulmonary function, and exercise tolerance he would be a good surgical candidate and is agreeable to proceed with a left robotic assisted thoracoscopy and left upper lobectomy.  Findings: Normal anatomy.  Large Station 6 node.  No leak  Operative Technique: After the risks, benefits and alternatives were thoroughly discussed, the patient was brought to the operative theatre.  Anesthesia was induced, and the patient was then placed in a right lateral decubitus position and was prepped and draped in normal sterile fashion.  An appropriate surgical pause was performed, and pre-operative antibiotics were dosed accordingly.  We began by placing our 4 robotic ports in the the 7th intercostal space targeting the hilum of the lung.  A 57mm assistant port was placed in the 9th intercostal space in the anterior axillary line.  The robot was then docked and all instruments were passed under direct visualization.    The lung was then retracted superiorly, and the inferior pulmonary ligament was divided.  The hilum was mobilized anteriorly and posteriorly.  We identified the  upper lobe vein, and after careful isolation, it was divided with a vascular stapler.  We next moved to the  Pulmonary artery.  The anterior truncal branches were then divided with a vascular load stapler.  We then moved to the fissure, and identified the lingular branches.  It was then divided with a vascular load stapler.  The bronchus to the upper was then isolated.  After a test clamp, with good ventilation of the lower lobe, the bronchus was then divided.  The fissure was completed, and the specimen was passed into an endocatch bag.  It was removed from the superior access site.    Lymph nodes were then sampled at levels 7, 6, 10, and hilum.  The chest was irrigated, and an air leak test was performed.  An intercostal nerve block was performed under direct visualization.  A 41F chest with then placed, and we watch the remaining lobes re-expand.  The skin and soft tissue were closed with absorbable suture    The patient tolerated the procedure without any immediate complications, and was transferred to the PACU in stable condition.  [name] [name]

## 2019-12-16 NOTE — Anesthesia Procedure Notes (Addendum)
Arterial Line Insertion Start/End9/04/2019 7:45 AM, 12/16/2019 7:55 AM Performed by: Belinda Block, MD, Rhyder Bratz, Durwin Glaze, CRNA, CRNA  Patient location: Pre-op. Preanesthetic checklist: patient identified, IV checked, risks and benefits discussed, surgical consent, pre-op evaluation and anesthesia consent Lidocaine 1% used for infiltration Right, radial was placed Catheter size: 20 G Hand hygiene performed  and maximum sterile barriers used  Allen's test indicative of satisfactory collateral circulation Attempts: 2 Procedure performed without using ultrasound guided technique. Following insertion, dressing applied and Biopatch. Post procedure assessment: normal  Patient tolerated the procedure well with no immediate complications. Additional procedure comments: Performed under supervision by Darrin Nipper.

## 2019-12-16 NOTE — Anesthesia Procedure Notes (Addendum)
Procedure Name: Intubation Performed by: Draeden Kellman T, CRNA Pre-anesthesia Checklist: Patient identified, Emergency Drugs available, Suction available and Patient being monitored Patient Re-evaluated:Patient Re-evaluated prior to induction Oxygen Delivery Method: Circle system utilized Preoxygenation: Pre-oxygenation with 100% oxygen Induction Type: IV induction Ventilation: Mask ventilation without difficulty and Two handed mask ventilation required Laryngoscope Size: Mac and 4 Grade View: Grade I Endobronchial tube: Left, Double lumen EBT, EBT position confirmed by auscultation and EBT position confirmed by fiberoptic bronchoscope and 39 Fr Number of attempts: 1 Airway Equipment and Method: Stylet and Oral airway Placement Confirmation: ETT inserted through vocal cords under direct vision,  positive ETCO2 and breath sounds checked- equal and bilateral Tube secured with: Tape Dental Injury: Teeth and Oropharynx as per pre-operative assessment  Comments: Placed by Darrin Nipper

## 2019-12-16 NOTE — Brief Op Note (Signed)
12/16/2019  11:51 AM  PATIENT:  Jesse [name]  69 y.o. male  PRE-OPERATIVE DIAGNOSIS:  LEFT UPPER LOBE LUNG CANCER  POST-OPERATIVE DIAGNOSIS:  LEFT UPPER LOBE LUNG CANCER  PROCEDURE:   XI ROBOTIC ASSISTED THORASCOPY-LEFT UPPER LOBECTOMY (Left) NODE DISSECTION (Left) INTERCOSTAL NERVE BLOCK (Left)  SURGEON:  Lajuana Matte, MD - Primary  PHYSICIAN ASSISTANT: Jasier Calabretta  ANESTHESIA:   general  EBL:  75 mL   BLOOD ADMINISTERED:none  DRAINS: Left Pleural tube   LOCAL MEDICATIONS USED:  Intercostal Exparel  SPECIMEN: Left upper lung lobe, multiple lymph nodes  DISPOSITION OF SPECIMEN:  PATHOLOGY  COUNTS:  YES  DICTATION: .Dragon Dictation  PLAN OF CARE: Admit to inpatient   PATIENT DISPOSITION:  PACU - guarded condition.   Delay start of Pharmacological VTE agent (>24hrs) due to surgical blood loss or risk of bleeding: yes

## 2019-12-16 NOTE — Plan of Care (Signed)
  Problem: Education: Goal: Knowledge of the prescribed therapeutic regimen will improve Outcome: Progressing   Problem: Activity: Goal: Risk for activity intolerance will decrease Outcome: Progressing   Problem: Cardiac: Goal: Will achieve and/or maintain hemodynamic stability Outcome: Progressing   Problem: Clinical Measurements: Goal: Postoperative complications will be avoided or minimized Outcome: Progressing   Problem: Respiratory: Goal: Respiratory status will improve Outcome: Progressing   Problem: Pain Management: Goal: Pain level will decrease Outcome: Progressing   Problem: Skin Integrity: Goal: Wound healing without signs and symptoms infection will improve Outcome: Progressing

## 2019-12-17 ENCOUNTER — Inpatient Hospital Stay (HOSPITAL_COMMUNITY): Payer: Medicare Other

## 2019-12-17 ENCOUNTER — Encounter (HOSPITAL_COMMUNITY): Payer: Self-pay | Admitting: Thoracic Surgery (Cardiothoracic Vascular Surgery)

## 2019-12-17 LAB — CBC
HCT: 33.7 % — ABNORMAL LOW (ref 39.0–52.0)
Hemoglobin: 11.4 g/dL — ABNORMAL LOW (ref 13.0–17.0)
MCH: 33.1 pg (ref 26.0–34.0)
MCHC: 33.8 g/dL (ref 30.0–36.0)
MCV: 98 fL (ref 80.0–100.0)
Platelets: 122 10*3/uL — ABNORMAL LOW (ref 150–400)
RBC: 3.44 MIL/uL — ABNORMAL LOW (ref 4.22–5.81)
RDW: 13.3 % (ref 11.5–15.5)
WBC: 7.7 10*3/uL (ref 4.0–10.5)
nRBC: 0 % (ref 0.0–0.2)

## 2019-12-17 LAB — BASIC METABOLIC PANEL
Anion gap: 8 (ref 5–15)
BUN: 16 mg/dL (ref 8–23)
CO2: 27 mmol/L (ref 22–32)
Calcium: 8.7 mg/dL — ABNORMAL LOW (ref 8.9–10.3)
Chloride: 99 mmol/L (ref 98–111)
Creatinine, Ser: 0.79 mg/dL (ref 0.61–1.24)
GFR calc Af Amer: >60 mL/min (ref >60)
GFR calc non Af Amer: >60 mL/min (ref >60)
Glucose, Bld: 119 mg/dL — ABNORMAL HIGH (ref 70–99)
Potassium: 3.5 mmol/L (ref 3.5–5.1)
Sodium: 134 mmol/L — ABNORMAL LOW (ref 135–145)

## 2019-12-17 LAB — SURGICAL PATHOLOGY

## 2019-12-17 MED ORDER — KETOROLAC TROMETHAMINE 30 MG/ML IJ SOLN
15.0000 mg | Freq: Four times a day (QID) | INTRAMUSCULAR | Status: AC
  Administered 2019-12-17 – 2019-12-18 (×5): 15 mg via INTRAVENOUS
  Filled 2019-12-17 (×5): qty 1

## 2019-12-17 MED ORDER — DIPHENHYDRAMINE HCL 25 MG PO CAPS
25.0000 mg | ORAL_CAPSULE | Freq: Every evening | ORAL | Status: DC | PRN
  Administered 2019-12-17: 25 mg via ORAL
  Filled 2019-12-17: qty 1

## 2019-12-17 NOTE — Plan of Care (Signed)
  Problem: Education: Goal: Knowledge of the prescribed therapeutic regimen will improve Outcome: Progressing   Problem: Activity: Goal: Risk for activity intolerance will decrease Outcome: Progressing   Problem: Cardiac: Goal: Will achieve and/or maintain hemodynamic stability Outcome: Progressing   Problem: Clinical Measurements: Goal: Postoperative complications will be avoided or minimized Outcome: Progressing   Problem: Respiratory: Goal: Respiratory status will improve Outcome: Progressing   Problem: Pain Management: Goal: Pain level will decrease Outcome: Progressing   Problem: Skin Integrity: Goal: Wound healing without signs and symptoms infection will improve Outcome: Progressing   Problem: Education: Goal: Knowledge of General Education information will improve Description: Including pain rating scale, medication(s)/side effects and non-pharmacologic comfort measures Outcome: Progressing   Problem: Health Behavior/Discharge Planning: Goal: Ability to manage health-related needs will improve Outcome: Progressing   Problem: Clinical Measurements: Goal: Ability to maintain clinical measurements within normal limits will improve Outcome: Progressing Goal: Will remain free from infection Outcome: Progressing Goal: Diagnostic test results will improve Outcome: Progressing Goal: Respiratory complications will improve Outcome: Progressing Goal: Cardiovascular complication will be avoided Outcome: Progressing   Problem: Activity: Goal: Risk for activity intolerance will decrease Outcome: Progressing   Problem: Nutrition: Goal: Adequate nutrition will be maintained Outcome: Progressing   Problem: Coping: Goal: Level of anxiety will decrease Outcome: Progressing   Problem: Elimination: Goal: Will not experience complications related to bowel motility Outcome: Progressing Goal: Will not experience complications related to urinary retention Outcome:  Progressing   Problem: Pain Managment: Goal: General experience of comfort will improve Outcome: Progressing   Problem: Safety: Goal: Ability to remain free from injury will improve Outcome: Progressing   Problem: Skin Integrity: Goal: Risk for impaired skin integrity will decrease Outcome: Progressing

## 2019-12-17 NOTE — Hospital Course (Signed)
HPI:   Jesse [name] 69 y.o. male 69 year old gentleman referred by Dr. 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  He has a history of tobacco use and was in our lung cancer screening program and noted interval increase in size of this lesion from 4 mm.  PET scan showed metabolic activity with an SUV of 2.6 and he subsequently underwent a navigational bronchoscopic biopsy which showed adenocarcinoma.  Incidentally he was noted to have a pelvic mass between his bladder and colon on the PET/CT and this is currently being evaluated as well.  He does endorse some dysuria and some constipation.   His weight has been stable for the most part.  He continues to remain very active with excellent exercise tolerance.    Hospital Course:   On 12/16/2019 [name] underwent a robotic assisted thoracoscopy with left upper lobectomy by Dr. Kipp Brood.  He tolerated the procedure well was transferred to the stepdown unit for continued care.  He was extubated timely manner.  Postop day 1 overall he was feeling well.  He did have trouble sleeping the night before.  His chest x-ray remained stable and there was no air leak on his chest tube.  His chest tube was switched to waterseal.  His renal function remained stable.  We did add Benadryl as needed for his insomnia.  His chest tube was removed on ***.  His x-ray following chest tube removal showed ***.  Today, his pain is well controlled, he is ambulating with limited assistance, and he is tolerating room air.  It was determined he was ready for discharge.

## 2019-12-17 NOTE — Anesthesia Postprocedure Evaluation (Signed)
Anesthesia Post Note  Patient: Jesse [name]  Procedure(s) Performed: XI ROBOTIC ASSISTED THORASCOPY-LEFT UPPER LOBECTOMY (Left Chest) NODE DISSECTION (Left Chest) INTERCOSTAL NERVE BLOCK (Left Chest)     Patient location during evaluation: PACU Anesthesia Type: General Level of consciousness: awake Pain management: pain level controlled Vital Signs Assessment: post-procedure vital signs reviewed and stable Respiratory status: spontaneous breathing Cardiovascular status: stable Postop Assessment: no apparent nausea or vomiting Anesthetic complications: no   No complications documented.  Last Vitals:  Vitals:   12/17/19 0900 12/17/19 1100  BP: (!) 145/70 (!) 149/81  Pulse: 67 68  Resp: 20 18  Temp:  36.9 C  SpO2: 95% 96%    Last Pain:  Vitals:   12/17/19 1100  TempSrc: Oral  PainSc:                  Ronaldo Crilly

## 2019-12-17 NOTE — Discharge Instructions (Signed)
Robot-Assisted Thoracic Surgery, Care After This sheet gives you information about how to care for yourself after your procedure. Your health care provider may also give you more specific instructions. If you have problems or questions, contact your health care provider. What can I expect after the procedure? After the procedure, it is common to have:  Some pain and soreness in your chest.  Pain when breathing in (inhaling) and coughing.  Constipation.  Fatigue.  Difficulty sleeping. Follow these instructions at home: Preventing pneumonia  Take deep breaths or do breathing exercises as instructed by your health care provider. Doing this helps prevent lung infection (pneumonia).  Cough frequently. Coughing may cause discomfort, but it is important to clear mucus (phlegm) and expand your lungs. If it hurts to cough, hold a pillow against your chest or place the palms of both hands on top of the incision (use splinting) when you cough. This may help relieve discomfort.  If you were given an incentive spirometer, use it as directed. An incentive spirometer is a tool that measures how well you are filling your lungs with each breath.  Participate in pulmonary rehabilitation as directed by your health care provider. This is a program that combines education, exercise, and support from a team of specialists. The goal is to help you heal and get back to your normal activities as soon as possible. Medicines  Take over-the-counter or prescription medicines only as told by your health care provider.  If you have pain, take pain-relieving medicine before your pain becomes severe. This is important because if your pain is under control, you will be able to breathe and cough more comfortably.  If you were prescribed an antibiotic medicine, take it as told by your health care provider. Do not stop taking the antibiotic even if you start to feel better. Activity  Ask your health care provider what  activities are safe for you.  Avoid activities that use your chest muscles for at least 3-4 weeks.  Do not lift anything that is heavier than 10 lb (4.5 kg), or the limit that your health care provider tells you, until he or she says that it is safe. Incision care  Follow instructions from your health care provider about how to take care of your incision(s). Make sure you: ? Wash your hands with soap and water before you change your bandage (dressing). If soap and water are not available, use hand sanitizer. ? Change your dressing as told by your health care provider. ? Leave stitches (sutures), skin glue, or adhesive strips in place. These skin closures may need to stay in place for 2 weeks or longer. If adhesive strip edges start to loosen and curl up, you may trim the loose edges. Do not remove adhesive strips completely unless your health care provider tells you to do that.  Keep your dressing dry until it has been removed.  Check your incision area every day for signs of infection. Check for: ? Redness, swelling, or pain. ? Fluid or blood. ? Warmth. ? Pus or a bad smell. Bathing  Do not take baths, swim, or use a hot tub until your health care provider approves. You may take showers.  After your dressing has been removed, use soap and water to gently wash your incision area. Do not use anything else to clean your incision(s) unless your health care provider tells you to do this. Driving   Do not drive until your health care provider approves.  Do not drive or  use heavy machinery while taking prescription pain medicine. Eating and drinking  Eat a healthy, balanced diet as instructed by your health care provider. A healthy diet includes plenty of fresh fruits and vegetables, whole grains, and low-fat (lean) proteins.  Limit foods that are high in fat and processed sugars, such as fried and sweet foods.  Drink enough fluid to keep your urine clear or pale yellow. General  instructions   To prevent or treat constipation while you are taking prescription pain medicine, your health care provider may recommend that you: ? Take over-the-counter or prescription medicines. ? Eat foods that are high in fiber, such as beans, fresh fruits and vegetables, and whole grains.  Do not use any products that contain nicotine or tobacco, such as cigarettes and e-cigarettes. If you need help quitting, ask your health care provider.  Avoid secondhand smoke.  Wear compression stockings as told by your health care provider. These stockings help to prevent blood clots and reduce swelling in your legs.  If you have a chest tube, care for it as instructed by your health care provider. Do not travel by airplane during the 2 weeks after your chest tube is removed, or until your health care provider says that this is safe.  Keep all follow-up visits as told by your health care provider. This is important. Contact a health care provider if:  You have redness, swelling, or pain around an incision.  You have fluid or blood coming from an incision.  Your incision area feels warm to the touch.  You have pus or a bad smell coming from an incision.  You have a fever or chills.  You have nausea or vomiting.  You have pain that does not get better with medicine. Get help right away if:  You have chest pain.  Your heart is fluttering or beating rapidly.  You develop a rash.  You have shortness of breath or trouble breathing.  You are confused.  You have trouble speaking.  You feel weak, light-headed, or dizzy.  You faint. Summary  To help prevent lung infection (pneumonia), take deep breaths or do breathing exercises as instructed by your health care provider.  Cough frequently to clear mucus (phlegm) and expand your lungs. If it hurts to cough, hold a pillow against your chest or place the palms of both hands on top of the incision (use splinting) when you cough.  If  you have pain, take pain-relieving medicine before your pain becomes severe. This is important because if your pain is under control, you will be able to breathe and cough more comfortably.  Ask your health care provider what activities are safe for you. This information is not intended to replace advice given to you by your health care provider. Make sure you discuss any questions you have with your health care provider. Document Revised: 03/15/2017 Document Reviewed: 03/12/2016 Elsevier Patient Education  2020 [employer].

## 2019-12-17 NOTE — Progress Notes (Addendum)
      [address]       [place] [address]             [phone]      1 Day Post-Op Procedure(s) (LRB): XI ROBOTIC ASSISTED THORASCOPY-LEFT UPPER LOBECTOMY (Left) NODE DISSECTION (Left) INTERCOSTAL NERVE BLOCK (Left) Subjective: Feels okay this morning. His only complaint is lack of sleep.   Objective: Vital signs in last 24 hours: Temp:  [97.8 F (36.6 C)-99.3 F (37.4 C)] 99 F (37.2 C) (09/02 0712) Pulse Rate:  [66-98] 72 (09/02 0712) Cardiac Rhythm: Normal sinus rhythm (09/02 0628) Resp:  [12-23] 16 (09/02 0712) BP: (115-150)/(71-95) 134/84 (09/02 0712) SpO2:  [93 %-100 %] 95 % (09/02 0712) Arterial Line BP: (175-185)/(73-84) 182/76 (09/01 1310) Weight:  [79.4 kg] 79.4 kg (09/01 1731)     Intake/Output from previous day: 09/01 0701 - 09/02 0700 In: 2130 [P.O.:220; I.V.:1560; IV Piggyback:350] Out: 2542 [HCWCB:7628; Blood:75; Chest Tube:330] Intake/Output this shift: No intake/output data recorded.  General appearance: alert, cooperative and no distress Heart: regular rate and rhythm, S1, S2 normal, no murmur, click, rub or gallop Lungs: clear to auscultation bilaterally and faint crackles Abdomen: soft, non-tender; bowel sounds normal; no masses,  no organomegaly Extremities: extremities normal, atraumatic, no cyanosis or edema Wound: clean and dry  Lab Results: Recent Labs    12/14/19 1108 12/17/19 0146  WBC 7.2 7.7  HGB 13.2 11.4*  HCT 40.0 33.7*  PLT 131* 122*   BMET:  Recent Labs    12/14/19 1108 12/17/19 0146  NA 138 134*  K 3.9 3.5  CL 106 99  CO2 20* 27  GLUCOSE 122* 119*  BUN 20 16  CREATININE 0.66 0.79  CALCIUM 9.4 8.7*    PT/INR:  Recent Labs    12/14/19 1108  LABPROT 12.1  INR 0.9   ABG    Component Value Date/Time   PHART 7.430 12/14/2019 1141   HCO3 22.9 12/14/2019 1141   ACIDBASEDEF 0.9 12/14/2019 1141   O2SAT 97.9 12/14/2019 1141   CBG (last 3)  No results for input(s): GLUCAP in the last 72  hours.  Assessment/Plan: S/P Procedure(s) (LRB): XI ROBOTIC ASSISTED THORASCOPY-LEFT UPPER LOBECTOMY (Left) NODE DISSECTION (Left) INTERCOSTAL NERVE BLOCK (Left)  1. CV-NSR in the 70s, Bp well controlled. CXR post-op stable, no pneumo, no air leak.  2. Pulm-tolerating room air with good oxygen saturation 3. Renal-creatinine 0.79, electrolytes okay 4. H and H 11.4/33.7, stable 5. Endo- blood glucose well controlled  Plan: Place chest tube to water seal. CXR in the morning. Will order benadryl PRN for insomnia-patient takes this at home for sleep.     LOS: 1 day    Elgie Collard 12/17/2019  Doing well. No leak with cough Lungs fully expanded on chest x-ray. We will remove chest tube tomorrow. Likely home tomorrow.  Myonna Chisom [name]

## 2019-12-18 ENCOUNTER — Inpatient Hospital Stay (HOSPITAL_COMMUNITY): Payer: Medicare Other

## 2019-12-18 LAB — COMPREHENSIVE METABOLIC PANEL
ALT: 13 U/L (ref 0–44)
AST: 21 U/L (ref 15–41)
Albumin: 3.3 g/dL — ABNORMAL LOW (ref 3.5–5.0)
Alkaline Phosphatase: 60 U/L (ref 38–126)
Anion gap: 7 (ref 5–15)
BUN: 13 mg/dL (ref 8–23)
CO2: 28 mmol/L (ref 22–32)
Calcium: 8.9 mg/dL (ref 8.9–10.3)
Chloride: 104 mmol/L (ref 98–111)
Creatinine, Ser: 0.68 mg/dL (ref 0.61–1.24)
GFR calc Af Amer: >60 mL/min (ref >60)
GFR calc non Af Amer: >60 mL/min (ref >60)
Glucose, Bld: 120 mg/dL — ABNORMAL HIGH (ref 70–99)
Potassium: 3.8 mmol/L (ref 3.5–5.1)
Sodium: 139 mmol/L (ref 135–145)
Total Bilirubin: 0.7 mg/dL (ref 0.3–1.2)
Total Protein: 5.8 g/dL — ABNORMAL LOW (ref 6.5–8.1)

## 2019-12-18 LAB — CBC
HCT: 33.8 % — ABNORMAL LOW (ref 39.0–52.0)
Hemoglobin: 11 g/dL — ABNORMAL LOW (ref 13.0–17.0)
MCH: 32.4 pg (ref 26.0–34.0)
MCHC: 32.5 g/dL (ref 30.0–36.0)
MCV: 99.7 fL (ref 80.0–100.0)
Platelets: 116 10*3/uL — ABNORMAL LOW (ref 150–400)
RBC: 3.39 MIL/uL — ABNORMAL LOW (ref 4.22–5.81)
RDW: 13.3 % (ref 11.5–15.5)
WBC: 6.6 10*3/uL (ref 4.0–10.5)
nRBC: 0 % (ref 0.0–0.2)

## 2019-12-18 MED ORDER — DIPHENHYDRAMINE HCL 25 MG PO CAPS
25.0000 mg | ORAL_CAPSULE | Freq: Every evening | ORAL | 0 refills | Status: DC | PRN
Start: 2019-12-18 — End: 2021-12-14

## 2019-12-18 MED ORDER — ACETAMINOPHEN 500 MG PO TABS
1000.0000 mg | ORAL_TABLET | Freq: Four times a day (QID) | ORAL | 0 refills | Status: DC
Start: 2019-12-18 — End: 2020-01-28

## 2019-12-18 MED ORDER — AMLODIPINE BESYLATE 10 MG PO TABS: 10.0000 mg | ORAL_TABLET | Freq: Every day | ORAL | 1 refills | Status: DC

## 2019-12-18 MED ORDER — TRAMADOL HCL 50 MG PO TABS
50.0000 mg | ORAL_TABLET | Freq: Four times a day (QID) | ORAL | 0 refills | Status: DC | PRN
Start: 2019-12-18 — End: 2020-01-28

## 2019-12-18 MED ORDER — AMLODIPINE BESYLATE 10 MG PO TABS
10.0000 mg | ORAL_TABLET | Freq: Every day | ORAL | Status: DC
  Administered 2019-12-18: 10 mg via ORAL
  Filled 2019-12-18: qty 1

## 2019-12-18 MED ORDER — ATORVASTATIN CALCIUM 20 MG PO TABS
20.0000 mg | ORAL_TABLET | Freq: Every day | ORAL | 0 refills | Status: DC
Start: 2019-12-19 — End: 2020-01-12

## 2019-12-18 NOTE — Progress Notes (Signed)
Pt discharged home with spouse, taken to front of hospital via w/c to meet wife. Pt has discharge instructions, and teaching completed

## 2019-12-18 NOTE — Progress Notes (Signed)
      [address]       [place] [address]             [phone]      2 Days Post-Op Procedure(s) (LRB): XI ROBOTIC ASSISTED THORASCOPY-LEFT UPPER LOBECTOMY (Left) NODE DISSECTION (Left) INTERCOSTAL NERVE BLOCK (Left) Subjective: Feels okay this morning. He does not like the food here but eating what he can.  Objective: Vital signs in last 24 hours: Temp:  [98.4 F (36.9 C)-98.9 F (37.2 C)] 98.9 F (37.2 C) (09/03 0417) Pulse Rate:  [67-78] 76 (09/03 0417) Cardiac Rhythm: Normal sinus rhythm (09/03 0417) Resp:  [11-20] 17 (09/03 0417) BP: (137-163)/(70-88) 163/88 (09/03 0417) SpO2:  [94 %-97 %] 97 % (09/03 0417)    Intake/Output from previous day: 09/02 0701 - 09/03 0700 In: 3056.3 [P.O.:840; I.V.:2216.3] Out: 2800 [Urine:2550; Chest Tube:250] Intake/Output this shift: No intake/output data recorded.  General appearance: alert, cooperative and no distress Heart: regular rate and rhythm, S1, S2 normal, no murmur, click, rub or gallop Lungs: clear to auscultation bilaterally Abdomen: soft, non-tender; bowel sounds normal; no masses,  no organomegaly Extremities: extremities normal, atraumatic, no cyanosis or edema Wound: clean and dry  Lab Results: Recent Labs    12/17/19 0146 12/18/19 0037  WBC 7.7 6.6  HGB 11.4* 11.0*  HCT 33.7* 33.8*  PLT 122* 116*   BMET:  Recent Labs    12/17/19 0146 12/18/19 0037  NA 134* 139  K 3.5 3.8  CL 99 104  CO2 27 28  GLUCOSE 119* 120*  BUN 16 13  CREATININE 0.79 0.68  CALCIUM 8.7* 8.9    PT/INR: No results for input(s): LABPROT, INR in the last 72 hours. ABG    Component Value Date/Time   PHART 7.430 12/14/2019 1141   HCO3 22.9 12/14/2019 1141   ACIDBASEDEF 0.9 12/14/2019 1141   O2SAT 97.9 12/14/2019 1141   CBG (last 3)  No results for input(s): GLUCAP in the last 72 hours.  Assessment/Plan: S/P Procedure(s) (LRB): XI ROBOTIC ASSISTED THORASCOPY-LEFT UPPER LOBECTOMY (Left) NODE  DISSECTION (Left) INTERCOSTAL NERVE BLOCK (Left)  1. CV-NSR in the 70s, Bp elevated this morning. Will increase norvasc to 10mg . CXR stable this morning, no pneumo, no air leak.  2. Pulm-tolerating room air with good oxygen saturation 3. Renal-creatinine 0.68, electrolytes okay 4. H and H 11.0/33.8, stable 5. Endo- blood glucose well controlled   Plan: Seems to just be a fluid wave and no leak this morning. CXR stable. May be able to discontinue chest tube this morning, CXR at noon, and then discharge later today.    LOS: 2 days    Elgie Collard 12/18/2019

## 2019-12-18 NOTE — Plan of Care (Signed)
  Problem: Education: Goal: Knowledge of the prescribed therapeutic regimen will improve Outcome: Progressing   Problem: Activity: Goal: Risk for activity intolerance will decrease Outcome: Progressing   Problem: Cardiac: Goal: Will achieve and/or maintain hemodynamic stability Outcome: Progressing   Problem: Clinical Measurements: Goal: Postoperative complications will be avoided or minimized Outcome: Progressing   Problem: Respiratory: Goal: Respiratory status will improve Outcome: Progressing   Problem: Pain Management: Goal: Pain level will decrease Outcome: Progressing   Problem: Skin Integrity: Goal: Wound healing without signs and symptoms infection will improve Outcome: Progressing   Problem: Education: Goal: Knowledge of General Education information will improve Description: Including pain rating scale, medication(s)/side effects and non-pharmacologic comfort measures Outcome: Progressing   Problem: Health Behavior/Discharge Planning: Goal: Ability to manage health-related needs will improve Outcome: Progressing   Problem: Clinical Measurements: Goal: Ability to maintain clinical measurements within normal limits will improve Outcome: Progressing Goal: Will remain free from infection Outcome: Progressing Goal: Diagnostic test results will improve Outcome: Progressing Goal: Respiratory complications will improve Outcome: Progressing Goal: Cardiovascular complication will be avoided Outcome: Progressing   Problem: Activity: Goal: Risk for activity intolerance will decrease Outcome: Progressing   Problem: Nutrition: Goal: Adequate nutrition will be maintained Outcome: Progressing   Problem: Coping: Goal: Level of anxiety will decrease Outcome: Progressing   Problem: Elimination: Goal: Will not experience complications related to bowel motility Outcome: Progressing Goal: Will not experience complications related to urinary retention Outcome:  Progressing   Problem: Pain Managment: Goal: General experience of comfort will improve Outcome: Progressing   Problem: Safety: Goal: Ability to remain free from injury will improve Outcome: Progressing   Problem: Skin Integrity: Goal: Risk for impaired skin integrity will decrease Outcome: Progressing

## 2019-12-18 NOTE — Discharge Summary (Signed)
Physician Discharge Summary        Jesse [name]       Jesse [name],Jesse [name] [id number]             [phone]     Patient ID: Jesse [name] MRN: [id number] DOB/AGE: July 04, 1950 69 y.o.  Admit date: 12/16/2019 Discharge date: 12/18/2019  Admission Diagnoses:  Patient Active Problem List   Diagnosis Date Noted  . Lung nodule 10/13/2019  . Essential hypertension 09/28/2016  . Coronary artery calcification 09/28/2016  . Impaired glucose tolerance 09/28/2016  . TOBACCO USER 02/16/2009  . NEOPLASM, MALIGNANT, BLADDER, HX OF 07/18/2007  . VITAMIN D DEFICIENCY 06/03/2007  . DIVERTICULOSIS, COLON 01/14/2007  . Osteoarthritis 01/14/2007  . Dyslipidemia 01/09/2007  . GERD 01/09/2007  . REFLUX ESOPHAGITIS 06/01/2004  . GASTRITIS, CHRONIC 06/01/2004    Discharge Diagnoses:  Active Problems:   S/P lobectomy of lung   Discharged Condition: good  HPI:   Jesse [name] 68 y.o. 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  He has a history of tobacco use and was in our lung cancer screening program and noted interval increase in size of this lesion from 4 mm.  PET scan showed metabolic activity with an SUV of 2.6 and he subsequently underwent a navigational bronchoscopic biopsy which showed adenocarcinoma.  Incidentally he was noted to have a pelvic mass between his bladder and colon on the PET/CT and this is currently being evaluated as well.  He does endorse some dysuria and some constipation.   His weight has been stable for the most part.  He continues to remain very active with excellent exercise tolerance.   Hospital Course:   On 12/16/2019 Jesse [name] underwent a robotic assisted thoracoscopy with left upper lobectomy by Dr. Kipp [name].  He tolerated the procedure well was transferred to the stepdown unit for continued care.  He was extubated timely manner.  Postop day 1 overall he was feeling  well.  He did have trouble sleeping the night before.  His chest x-ray remained stable and there was no air leak on his chest tube.  His chest tube was switched to waterseal.  His renal function remained stable.  We did add Benadryl as needed for his insomnia.  His chest tube was removed on 9/3.  His x-ray following chest tube removal showed a small stable left apical pneumothorax.  Today, his pain is well controlled, he is ambulating with limited assistance, and he is tolerating room air.  It was determined he was ready for discharge.  Consults: None  Significant Diagnostic Studies:  CLINICAL DATA:  Follow-up chest 2 from left side.  EXAM: PORTABLE CHEST 1 VIEW  COMPARISON:  12/18/2019.  FINDINGS: Interval removal of a left chest tube. There is a small left apical pneumothorax. No midline shift. Linear left basilar opacities. No consolidation. No sizable pleural effusion. Cardiomediastinal silhouette is similar to prior and within normal limits. Mild subcutaneous emphysema along the left lateral chest wall inferiorly. No acute osseous abnormality.  IMPRESSION: Interval removal of the left chest tube with a small left apical pneumothorax.  These results will be called to the ordering clinician or representative by the Radiologist Assistant, and communication documented in the PACS or [employer].   Electronically Signed   By: Jesse Sheffield MD   On: 12/18/2019 12:34    Treatments: Surgery  12/16/2019  Patient:  Jesse [name] Pre-Op Dx: Left upper lobe adenocarcinoma  Post-op Dx:  sam Procedure: - Robotic assisted left video thoracoscopy - left upper lobectomy - Mediastinal lymph node sampling - Intercostal nerve block  Surgeon and Role:      * Lightfoot, Jesse Crater, MD - Primary    * M. Roddenberry, PA-C - assisting  Anesthesia  general EBL:  100 ml Blood Administration: none Specimen:  Left upper lobe.  Station 6, 7, 10, 11, and hilar  nodes  Drains: 54 F argyle chest tube in left chest Counts: correct   Indications: This is a 69 year old male with a 7 mm left upper lobe adenocarcinoma the lung. Additionally he also has a newly discovered pelvic mass that is currently undergoing diagnosis. Based off of his pulmonary function, and exercise tolerance he would be a good surgical candidate and is agreeable to proceed with a left robotic assisted thoracoscopy and left upper lobectomy.   Discharge Exam: Blood pressure (!) 144/76, pulse 88, temperature 99 F (37.2 C), temperature source Oral, resp. rate 18, height 5\' 9"  (1.753 m), weight 79.4 kg, SpO2 95 %.  General appearance: alert, cooperative and no distress Heart: regular rate and rhythm, S1, S2 normal, no murmur, click, rub or gallop Lungs: clear to auscultation bilaterally Abdomen: soft, non-tender; bowel sounds normal; no masses,  no organomegaly Extremities: extremities normal, atraumatic, no cyanosis or edema Wound: clean and dry   Disposition: Discharge disposition: 01-Home or Self Care        Allergies as of 12/18/2019   No Known Allergies     Medication List    STOP taking these medications   CANNABIDIOL PO   Chantix Starting Month Pak 0.5 MG X 11 & 1 MG X 42 tablet Generic drug: varenicline     TAKE these medications   acetaminophen 500 MG tablet Commonly known as: TYLENOL Take 2 tablets (1,000 mg total) by mouth every 6 (six) hours.   amLODipine 10 MG tablet Commonly known as: NORVASC Take 1 tablet (10 mg total) by mouth daily. Start taking on: December 19, 2019 What changed:   medication strength  how much to take   atorvastatin 20 MG tablet Commonly known as: LIPITOR Take 1 tablet (20 mg total) by mouth daily. Start taking on: December 19, 2019 What changed:   medication strength  how much to take  how to take this  when to take this  additional instructions   diphenhydrAMINE 25 mg capsule Commonly known as:  BENADRYL Take 1-2 capsules (25-50 mg total) by mouth at bedtime as needed for sleep.   ibuprofen 200 MG tablet Commonly known as: ADVIL Take 800 mg by mouth every 8 (eight) hours as needed for moderate pain.   nicotine polacrilex 2 MG gum Commonly known as: NICORETTE Take 2 mg by mouth 6 (six) times daily. What changed: Another medication with the same name was removed. Continue taking this medication, and follow the directions you see here.   omeprazole 40 MG capsule Commonly known as: PRILOSEC Take 1 capsule (40 mg total) by mouth daily. What changed: when to take this   PRESERVISION AREDS 2 PO Take 1 tablet by mouth in the morning and at bedtime.   PROSTATE SUPPORT PO Take 2 capsules by mouth daily. Prostate Revive   traMADol 50 MG tablet Commonly known as: ULTRAM Take 1 tablet (50 mg total) by mouth every 6 (six) hours as needed (mild pain).       Follow-up Information    [name], MD. Call in 1 day(s).   Specialty:  Family Medicine Contact information: [place] [address] [phone]        Lajuana Matte, MD Follow up.   Specialty: Cardiothoracic Surgery Why: Your appointment on 12/25/2019 at 1:15pm. Please bring your hospital paperwork Contact information: [address] [place] [address] [phone]               Signed: Elgie Collard 12/18/2019, 1:11 PM

## 2019-12-18 NOTE — Progress Notes (Signed)
CRITICAL VALUE ALERT  Critical Value:  CXR - Pneumothorax  Date & Time Notied:  12/18/2019 at 1235  Provider Notified: Jadene Pierini  Orders Received/Actions taken: Pt pending to go home, Jadene Pierini and MD will review CXR and determine if Pt will go home or stay another day.

## 2019-12-22 ENCOUNTER — Telehealth: Payer: Self-pay | Admitting: Family Medicine

## 2019-12-22 NOTE — Telephone Encounter (Signed)
Pt called to say he had surgery last wednesday and has not had a bowel movement in one week. Wanted to know what he could take to help or if she can prescribe something for him to take.  Please call him or send a message to [employer]

## 2019-12-22 NOTE — Telephone Encounter (Signed)
Spoke with Jesse [name] advised to continue taking Miralax and the colace like he has been taking and the our office will call Jesse [name]  tomorrow after Dr Volanda Napoleon reviews his request, Jesse [name] verbalized understanding

## 2019-12-23 NOTE — Telephone Encounter (Signed)
Pt called to check in on him s/p L lobectomy.  Pt doing well.  States had a BM this am and feeling better after taking miralax.  Has f/u in 2 days with CT surgery.  Saw some results via mychart which concerned him, but waiting until appt to hear next steps.  Has procedure for mass between bladder and rectum at the end of Sept.  Pt hoping to get back out in his garden soon.

## 2019-12-24 ENCOUNTER — Other Ambulatory Visit: Payer: Self-pay | Admitting: *Deleted

## 2019-12-24 NOTE — Progress Notes (Signed)
The proposed treatment discussed in cancer conference is for discussion purpose only and is not a binding recommendation. The patient was not physically examined nor present for their treatment options. Therefore, final treatment plans cannot be decided.  ?

## 2019-12-25 ENCOUNTER — Ambulatory Visit (INDEPENDENT_AMBULATORY_CARE_PROVIDER_SITE_OTHER): Payer: Self-pay | Admitting: Thoracic Surgery (Cardiothoracic Vascular Surgery)

## 2019-12-25 ENCOUNTER — Other Ambulatory Visit: Payer: Self-pay

## 2019-12-25 ENCOUNTER — Encounter: Payer: Self-pay | Admitting: Thoracic Surgery (Cardiothoracic Vascular Surgery)

## 2019-12-25 VITALS — BP 155/90 | HR 92 | Temp 97.8°F | Resp 20 | Ht 69.0 in | Wt 169.0 lb

## 2019-12-25 DIAGNOSIS — C3412 Malignant neoplasm of upper lobe, left bronchus or lung: Secondary | ICD-10-CM

## 2019-12-25 DIAGNOSIS — Z09 Encounter for follow-up examination after completed treatment for conditions other than malignant neoplasm: Secondary | ICD-10-CM

## 2019-12-25 NOTE — Progress Notes (Signed)
[address]       [place] [address]             [phone]        Morio H Saulnier [place] Medical Record #[id number] Date of Birth: 02-Dec-1950  Referring: Garner Nash, DO Primary Care: [name], MD Primary Cardiologist:No primary care provider on file.  Reason for visit:   follow-up  History of Present Illness:     Mr. Riviello comes in for his first follow-up appointment.  Overall he is doing well.  He denies any shortness of breath has been up moving around fine.  Physical Exam: BP (!) 155/90   Pulse 92   Temp 97.8 F (36.6 C) (Skin)   Resp 20   Ht 5\' 9"  (1.753 m)   Wt 169 lb (76.7 kg)   SpO2 96% Comment: RA  BMI 24.96 kg/m   Alert NAD Incision clean, stitches removed.   Abdomen soft, ND No peripheral edema   Diagnostic Studies & Laboratory data:  Path: SURGICAL PATHOLOGY  CASE: [id number]  PATIENT: Jesse [name]  Surgical Pathology Report      Clinical History: left upper lobe lung cancer (cm)      FINAL MICROSCOPIC DIAGNOSIS:   A. LUNG, LEFT UPPER LOBE, LOBECTOMY:  - Adenocarcinoma, 0.9 cm.  - Visceral pleura free of carcinoma.  - Lymphovascular space involvement by tumor.  - See oncology table and comment.   B. LYMPH NODE, 7, EXCISION:  - Lymph node with no metastatic carcinoma (0/1).   C. LYMPH NODE, 6 #1, EXCISION:  - Lymph node with no metastatic carcinoma (0/1).   D. LYMPH NODE, 6 #2, EXCISION:  - Lymph node with no metastatic carcinoma (0/1).   E. 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   F. 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   G. LYMPH NODE, HILAR #1, EXCISION:  - Lymph node with no metastatic carcinoma (0/1).   H. LYMPH NODE, HILAR #2, EXCISION:  - Lymph node with no metastatic carcinoma (0/1).   I. LYMPH NODE, HILAR #3, EXCISION:  - Lymph node with no metastatic carcinoma (0/1).   J.  LYMPH NODE, HILAR #4, EXCISION:  - Lymph node with no metastatic carcinoma (0/1).   K. LYMPH NODE, HILAR #5, EXCISION:  - Lymph node with no metastatic carcinoma (0/1).   ONCOLOGY TABLE:  LUNG: Resection  Procedure: Left upper lung lobectomy and lymph node biopsies.  Specimen Laterality: Left upper lung lobe.  Tumor Site: Left upper lung lobe.  Tumor Size: 0.9 x 0.8 x 0.8 cm.  Tumor Focality: Unifocal  Histologic Type: Poorly differentiated adenocarcinoma.  Visceral Pleura Invasion: Not identified.  Lymphovascular Invasion: Present.  Direct Invasion of Adjacent Structures: No adjacent structures  submitted.  Margins: Free of tumor, see comment.  Treatment Effect: No known presurgical therapy.  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  Currently doing well.  He is scheduled to undergo his pelvic biopsy at the end of this month.  Will make referral to Dr. Julien Nordmann for discussion of adjuvant therapy.  I will see him back in 1 month with  a chest x-ray.   Lajuana Matte 12/25/2019 1:40 PM

## 2019-12-28 ENCOUNTER — Telehealth: Payer: Self-pay | Admitting: *Deleted

## 2019-12-28 DIAGNOSIS — Z902 Acquired absence of lung [part of]: Secondary | ICD-10-CM

## 2019-12-28 NOTE — Telephone Encounter (Signed)
I received a referral on Jesse [name].  I called him today to schedule but was unable to reach. I did leave a vm message with my name and phone number to call.

## 2019-12-28 NOTE — Telephone Encounter (Signed)
I called patient today. I updated him on his appt to be seen this week with Dr. Julien Nordmann 12/31/19.  He verbalized understanding of appt

## 2019-12-31 ENCOUNTER — Encounter: Payer: Self-pay | Admitting: Medical Oncology

## 2019-12-31 ENCOUNTER — Encounter: Payer: Self-pay | Admitting: Internal Medicine

## 2019-12-31 ENCOUNTER — Inpatient Hospital Stay: Payer: Medicare Other | Attending: Internal Medicine | Admitting: Internal Medicine

## 2019-12-31 ENCOUNTER — Inpatient Hospital Stay: Payer: Medicare Other

## 2019-12-31 ENCOUNTER — Other Ambulatory Visit: Payer: Self-pay | Admitting: *Deleted

## 2019-12-31 ENCOUNTER — Other Ambulatory Visit: Payer: Self-pay

## 2019-12-31 VITALS — BP 144/79 | HR 86 | Temp 97.1°F | Resp 20 | Ht 69.0 in | Wt 167.0 lb

## 2019-12-31 DIAGNOSIS — Z902 Acquired absence of lung [part of]: Secondary | ICD-10-CM | POA: Diagnosis not present

## 2019-12-31 DIAGNOSIS — Z833 Family history of diabetes mellitus: Secondary | ICD-10-CM | POA: Insufficient documentation

## 2019-12-31 DIAGNOSIS — J449 Chronic obstructive pulmonary disease, unspecified: Secondary | ICD-10-CM | POA: Diagnosis not present

## 2019-12-31 DIAGNOSIS — C3412 Malignant neoplasm of upper lobe, left bronchus or lung: Secondary | ICD-10-CM

## 2019-12-31 DIAGNOSIS — Z79899 Other long term (current) drug therapy: Secondary | ICD-10-CM | POA: Diagnosis not present

## 2019-12-31 DIAGNOSIS — Z8249 Family history of ischemic heart disease and other diseases of the circulatory system: Secondary | ICD-10-CM | POA: Diagnosis not present

## 2019-12-31 DIAGNOSIS — Z8551 Personal history of malignant neoplasm of bladder: Secondary | ICD-10-CM | POA: Diagnosis not present

## 2019-12-31 DIAGNOSIS — C3490 Malignant neoplasm of unspecified part of unspecified bronchus or lung: Secondary | ICD-10-CM

## 2019-12-31 DIAGNOSIS — Z7189 Other specified counseling: Secondary | ICD-10-CM | POA: Diagnosis not present

## 2019-12-31 DIAGNOSIS — I10 Essential (primary) hypertension: Secondary | ICD-10-CM | POA: Diagnosis not present

## 2019-12-31 DIAGNOSIS — C3492 Malignant neoplasm of unspecified part of left bronchus or lung: Secondary | ICD-10-CM | POA: Insufficient documentation

## 2019-12-31 DIAGNOSIS — E785 Hyperlipidemia, unspecified: Secondary | ICD-10-CM | POA: Insufficient documentation

## 2019-12-31 DIAGNOSIS — K219 Gastro-esophageal reflux disease without esophagitis: Secondary | ICD-10-CM | POA: Insufficient documentation

## 2019-12-31 DIAGNOSIS — F17210 Nicotine dependence, cigarettes, uncomplicated: Secondary | ICD-10-CM

## 2019-12-31 DIAGNOSIS — F17200 Nicotine dependence, unspecified, uncomplicated: Secondary | ICD-10-CM

## 2019-12-31 LAB — CMP (CANCER CENTER ONLY)
ALT: 19 U/L (ref 0–44)
AST: 19 U/L (ref 15–41)
Albumin: 3.8 g/dL (ref 3.5–5.0)
Alkaline Phosphatase: 135 U/L — ABNORMAL HIGH (ref 38–126)
Anion gap: 10 (ref 5–15)
BUN: 21 mg/dL (ref 8–23)
CO2: 26 mmol/L (ref 22–32)
Calcium: 10.1 mg/dL (ref 8.9–10.3)
Chloride: 104 mmol/L (ref 98–111)
Creatinine: 0.88 mg/dL (ref 0.61–1.24)
GFR, Est AFR Am: >60 mL/min (ref >60)
GFR, Estimated: >60 mL/min (ref >60)
Glucose, Bld: 112 mg/dL — ABNORMAL HIGH (ref 70–99)
Potassium: 4.1 mmol/L (ref 3.5–5.1)
Sodium: 140 mmol/L (ref 135–145)
Total Bilirubin: 0.4 mg/dL (ref 0.3–1.2)
Total Protein: 7.5 g/dL (ref 6.5–8.1)

## 2019-12-31 LAB — CBC WITH DIFFERENTIAL (CANCER CENTER ONLY)
Abs Immature Granulocytes: 0.02 10*3/uL (ref 0.00–0.07)
Basophils Absolute: 0.1 10*3/uL (ref 0.0–0.1)
Basophils Relative: 1 %
Eosinophils Absolute: 0.5 10*3/uL (ref 0.0–0.5)
Eosinophils Relative: 6 %
HCT: 39.4 % (ref 39.0–52.0)
Hemoglobin: 13.4 g/dL (ref 13.0–17.0)
Immature Granulocytes: 0 %
Lymphocytes Relative: 21 %
Lymphs Abs: 1.8 10*3/uL (ref 0.7–4.0)
MCH: 32.6 pg (ref 26.0–34.0)
MCHC: 34 g/dL (ref 30.0–36.0)
MCV: 95.9 fL (ref 80.0–100.0)
Monocytes Absolute: 0.4 10*3/uL (ref 0.1–1.0)
Monocytes Relative: 5 %
Neutro Abs: 5.6 10*3/uL (ref 1.7–7.7)
Neutrophils Relative %: 67 %
Platelet Count: 254 10*3/uL (ref 150–400)
RBC: 4.11 MIL/uL — ABNORMAL LOW (ref 4.22–5.81)
RDW: 12.4 % (ref 11.5–15.5)
WBC Count: 8.4 10*3/uL (ref 4.0–10.5)
nRBC: 0 % (ref 0.0–0.2)

## 2019-12-31 NOTE — Progress Notes (Signed)
[id number]: Adjuvant Lung Cancer Enrichment Marker Identification and Sequencing Trial (ALCHEMIST). Genetic Testing for Patients with Resectable or Resected Lung Cancer Dr. Julien Nordmann referred patient to study this afternoon. I met with patient and spouse, after patient's appointment with MD. Patient confirms that MD reviewed the study with him and patient has expressed interest in participating in the study. I reviewed this screening consent with patient and spouse page by page, including the study Authorization forms. Patient confirms understanding the purpose of the screening portion, what the possible treatment studies that could be offered based on the results of his genetic tests, the length of the screening process and what extra tests and procedures will he need to have as part of the screening study, possible risks of participation, that his participation is voluntary and he can withdraw at any time, his rights and any costs involved in his participation in this screening, who will see his medical information and how it is kept private, as well as where he can obtain more information. Patient also confirms understanding that it may take up to two weeks to receive results. After all of patient and spouses questions were answered to their satisfaction, patient proceeded to sign, date and time this screening consent and authorization forms, Protocol Version Date 12/23/18. Patient also provided consent to the optional Banking Study. Patient informed that tissue will be requested tomorrow from pathology to send off to the study for testing.  Patient was provided with a copy of his signed documents, along with my contact information for his records. Patient and spouse were thanked for their time and interest in study and were encouraged to call with questions.  Approximately 15 minutes was spent with patient reviewing and consenting for study.  Maxwell Marion, RN, BSN, [facility] Clinical Research 12/31/2019 4:41  PM   Patient's eligibility reviewed by research nurses Cameo Rolanda Jay and Wilber Bihari, while I was out of office on Friday 01/01/20. It was identified that patient has a suspicious mass near his bladder. Cameo Windham reached out to the study chair and it was advised, by the study chair, that patient is currently not a good candidate for enrollment at this time (see Cameo's encounter note dated 01/01/20) pending results of biopsy. MD informed of this.  PA Cassie H. also informed on 01/01/20 and per Cameo's note, to request pathology to send tissue for molecular testing to be done off study and per standard of care. Cameo called patient Friday and left VM for patient to return call, in which patient had not. I spoke with patient this afternoon and informed him of the above. I confirmed with patient that he understands that at this time he is not eligible for study, due to needing results of biopsy for suspicious mass near bladder. Also, patient understands that his tissue will be sent out for molecular testing off study and per standard of care. Patient voiced understanding and informed me that biopsy has been scheduled for suspicious mass, with a follow-up clinic visit with Dr. Julien Nordmann on 01/21/20. I thanked patient for his time and encouraged him to call Dr. Worthy Flank office with further questions at this time.  Patient currently is not eligible for study and eligibility for study continuation is pending the results of biopsy of suspicious mass near bladder.  Maxwell Marion, RN, BSN, [facility] Clinical Research 01/04/2020 1:41 PM

## 2019-12-31 NOTE — Progress Notes (Signed)
The proposed treatment discussed in cancer conference 12/31/19 is for discussion purpose only and is not a binding recommendation.  The patient was not physically examined nor present for their treatment options.  Therefore, final treatment plans cannot be decided.

## 2019-12-31 NOTE — Progress Notes (Signed)
[facility] [phone]   [phone] Multidisciplinary thoracic oncology clinic  CONSULT NOTE  REFERRING PHYSICIAN: Dr. Melodie Bouillon  REASON FOR CONSULTATION:  69 years old white male recently diagnosed with lung cancer.  HPI Jesse [name] is a 68 y.o. male with past medical history significant for superficial bladder cancer status post resection, COPD, GERD, hypertension, dyslipidemia, right macular degeneration as well as history of rectal mass and long history of smoking.  The patient was undergoing CT screening for lung cancer because of his history for smoking.  CT scan of the chest on 09/02/2019 showed posterior left upper lobe lung nodule increased in size compared to the previous exam and measured 8.2 mm compared to 4.9 mm.  The patient had a PET scan on 09/16/2019 and it showed hypermetabolic nodule in the left upper lobe concerning for primary bronchogenic carcinoma.  There was no hypermetabolic mediastinal lymph nodes or distant metastatic disease.  There was a large rounded peripherally hypermetabolic mass position between the distal rectum, anus and posterior wall of the bladder of unclear etiology concerning for malignancy that could be related to his history of bladder cancer or less likely an abscess.  The patient had CTs about the of the chest on 10/06/2019 and it showed 1.2 x 0.8 x 0.5 cm left upper lobe nodule.  CT of the abdomen and pelvis on October 09, 2019 showed the unusual large peripherally enhancing mass position between the anterior wall of the rectum and the posterior wall of the prostate gland.  The mass had central necrosis and was intensely hypermetabolic on the PET scan consistent with malignancy.  It favored to be originating from the anus versus the anterior wall of the rectum suspicious for anal carcinoma.  The patient was seen by gastroenterology and had flex sigmoidoscopy on 10/21/2019 which showed a firm smooth rectal mass the  mass was noncircumferential and located predominantly at the anterior bowel wall.  He was referred to urology and expected to have cystoscopy and biopsy later this month. On 10/13/2019 the patient had bronchoscopy under the care of Dr. Valeta Harms and the final pathology was consistent with adenocarcinoma of the lung.  He was referred to Dr. Kipp Brood and on 12/16/2019 he underwent robotic assisted left VATS with left upper lobectomy and mediastinal lymph node sampling.  The final pathology [id number]) showed adenocarcinoma measuring 0.9 cm with no visceral pleural involvement but there was evidence for lymphovascular involvement by the tumor.  There was evidence of metastatic carcinoma involving lymph node at level 10 and level 11. Dr. Kipp Brood kindly referred the patient to the multidisciplinary thoracic oncology clinic today for evaluation and recommendation regarding adjuvant therapy. When seen today the patient is feeling fine and recovering well from the surgery.  He continues to have shortness of breath with exertion but no significant chest pain, cough or hemoptysis.  He has occasional nausea and diarrhea but no vomiting, abdominal pain or constipation.  He has no headache or visual changes. Family history significant for mother with dementia and father had diabetes mellitus.  No history of malignancy in his family. The patient is married and has 3 children 2 daughters and 1 son.  He was accompanied today by his wife Jesse [name].  He works in [employer].  He has a history of smoking 1 pack/day for around 60 years and quit in June 2021.  He also used to drink regularly but not recently.  He has a history of marijuana use in the past.  HPI  Past Medical History:  Diagnosis Date  . Anemia   . Cancer [facility])    bladder cancer  . COPD (chronic obstructive pulmonary disease) (HCC)    mild   . DIVERTICULOSIS, COLON 01/14/2007   Qualifier: Diagnosis of  By: Paulina Fusi RN, [name]   . GASTRITIS, CHRONIC 06/01/2004    Qualifier: Diagnosis of  By: Nolon Rod CMA (AAMA), Robin    . GERD 01/09/2007   Qualifier: Diagnosis of  By: Scherrie Gerlach    . Heart murmur    as a child; no murmur heard 12/14/19  . HYPERLIPIDEMIA 01/09/2007   Qualifier: Diagnosis of  By: Scherrie Gerlach    . Hypertension   . Lung mass    left upper nodule  . Macular degeneration    right eye  . NEOPLASM, MALIGNANT, BLADDER, HX OF 2002   Qualifier: Diagnosis of  By: Nolon Rod CMA (AAMA), Robin  / no chemo or radiation  . OSTEOARTHRITIS 01/14/2007   Qualifier: Diagnosis of  By: Paulina Fusi RN, [name] - knees  . Rectal mass   . Reflux esophagitis 06/01/2004   Qualifier: Diagnosis of  By: Nolon Rod CMA (AAMA), Robin    . TOBACCO USER 02/16/2009   Qualifier: Diagnosis of  By: Burnice Logan  MD, Doretha Sou   . VITAMIN D DEFICIENCY 06/03/2007   Qualifier: Diagnosis of  By: Burnice Logan  MD, Doretha Sou     Past Surgical History:  Procedure Laterality Date  . BRONCHIAL BIOPSY  10/13/2019   Procedure: BRONCHIAL BIOPSIES;  Surgeon: Garner Nash, DO;  Location: [place] [facility]COPY;  Service: Pulmonary;;  . BRONCHIAL BRUSHINGS  10/13/2019   Procedure: BRONCHIAL BRUSHINGS;  Surgeon: Garner Nash, DO;  Location: [place] [facility];  Service: Pulmonary;;  . BRONCHIAL NEEDLE ASPIRATION BIOPSY  10/13/2019   Procedure: BRONCHIAL NEEDLE ASPIRATION BIOPSIES;  Surgeon: Garner Nash, DO;  Location: [place] [facility]OSCOPY;  Service: Pulmonary;;  . BRONCHIAL WASHINGS  10/13/2019   Procedure: BRONCHIAL WASHINGS;  Surgeon: Garner Nash, DO;  Location: [place] [facility];  Service: Pulmonary;;  . COLONOSCOPY     multiple  . INTERCOSTAL NERVE BLOCK Left 12/16/2019   Procedure: INTERCOSTAL NERVE BLOCK;  Surgeon: Lajuana Matte, MD;  Location: [place];  Service: Thoracic;  Laterality: Left;  . KNEE SURGERY  07/20/2008   bilat.  Marland Kitchen NASAL SEPTUM SURGERY  1995  . NODE DISSECTION Left 12/16/2019   Procedure: NODE DISSECTION;  Surgeon: Lajuana Matte, MD;  Location: [place];  Service:  Thoracic;  Laterality: Left;  . TONSILLECTOMY    . TRANSURETHRAL RESECTION OF BLADDER TUMOR  2002  . UPPER GASTROINTESTINAL ENDOSCOPY    . VIDEO BRONCHOSCOPY WITH ENDOBRONCHIAL NAVIGATION N/A 10/13/2019   Procedure: VIDEO BRONCHOSCOPY WITH ENDOBRONCHIAL NAVIGATION;  Surgeon: Garner Nash, DO;  Location: [place];  Service: Pulmonary;  Laterality: N/A;  . WISDOM TOOTH EXTRACTION      Family History  Problem Relation Age of Onset  . Dementia Mother   . Diabetes Father   . Mental illness Father   . Peripheral vascular disease Brother     Social History Social History   Tobacco Use  . Smoking status: Current Every Day Smoker    Years: 40.00    Types: Cigarettes  . Smokeless tobacco: Never Used  . Tobacco comment: 8/30/2021per patient have not had a cigarette in 8 weeks, chews nicotine gum sometimes  Vaping Use  . Vaping Use: Never used  Substance Use Topics  . Alcohol use: Yes  Alcohol/week: 15.0 standard drinks    Types: 15 Standard drinks or equivalent per week    Comment: wine/beer/martini  . Drug use: Yes    Types: Marijuana    Comment: Smokes marijuana occasional, last use in 08/2019    No Known Allergies  Current Outpatient Medications  Medication Sig Dispense Refill  . OVER THE COUNTER MEDICATION Take 1 capsule by mouth daily as needed.    Marland Kitchen acetaminophen (TYLENOL) 500 MG tablet Take 2 tablets (1,000 mg total) by mouth every 6 (six) hours. 30 tablet 0  . amLODipine (NORVASC) 10 MG tablet Take 1 tablet (10 mg total) by mouth daily. 30 tablet 1  . atorvastatin (LIPITOR) 20 MG tablet Take 1 tablet (20 mg total) by mouth daily. 30 tablet 0  . diphenhydrAMINE (BENADRYL) 25 mg capsule Take 1-2 capsules (25-50 mg total) by mouth at bedtime as needed for sleep. 30 capsule 0  . Misc Natural Products (PROSTATE SUPPORT PO) Take 2 capsules by mouth daily. Prostate Revive    . Multiple Vitamins-Minerals (PRESERVISION AREDS 2 PO) Take 1 tablet by mouth in the morning and  at bedtime.    . nicotine polacrilex (NICORETTE) 2 MG gum Take 2 mg by mouth 6 (six) times daily.    Marland Kitchen omeprazole (PRILOSEC) 40 MG capsule Take 1 capsule (40 mg total) by mouth daily. (Patient taking differently: Take 40 mg by mouth daily before breakfast. ) 90 capsule 3  . traMADol (ULTRAM) 50 MG tablet Take 1 tablet (50 mg total) by mouth every 6 (six) hours as needed (mild pain). 30 tablet 0   No current facility-administered medications for this visit.    Review of Systems  Constitutional: positive for fatigue Eyes: negative Ears, nose, mouth, throat, and face: negative Respiratory: positive for dyspnea on exertion Cardiovascular: negative Gastrointestinal: positive for diarrhea and nausea Genitourinary:negative Integument/breast: negative Hematologic/lymphatic: negative Musculoskeletal:negative Neurological: negative Behavioral/Psych: negative Endocrine: negative Allergic/Immunologic: negative  Physical Exam  EVO:JJKKX, healthy, no distress, well nourished and well developed SKIN: skin color, texture, turgor are normal, no rashes or significant lesions HEAD: Normocephalic, No masses, lesions, tenderness or abnormalities EYES: normal, PERRLA, Conjunctiva are pink and non-injected EARS: External ears normal, Canals clear OROPHARYNX:no exudate, no erythema and lips, buccal mucosa, and tongue normal  NECK: supple, no adenopathy, no JVD LYMPH:  no palpable lymphadenopathy, no hepatosplenomegaly LUNGS: clear to auscultation , and palpation HEART: regular rate & rhythm, no murmurs and no gallops ABDOMEN:abdomen soft, non-tender, normal bowel sounds and no masses or organomegaly BACK: No CVA tenderness, Range of motion is normal EXTREMITIES:no joint deformities, effusion, or inflammation, no edema  NEURO: alert & oriented x 3 with fluent speech, no focal motor/sensory deficits  PERFORMANCE STATUS: ECOG 1  LABORATORY DATA: Lab Results  Component Value Date   WBC 8.4  12/31/2019   HGB 13.4 12/31/2019   HCT 39.4 12/31/2019   MCV 95.9 12/31/2019   PLT 254 12/31/2019      Chemistry      Component Value Date/Time   NA 140 12/31/2019 1428   K 4.1 12/31/2019 1428   CL 104 12/31/2019 1428   CO2 26 12/31/2019 1428   BUN 21 12/31/2019 1428   CREATININE 0.88 12/31/2019 1428      Component Value Date/Time   CALCIUM 10.1 12/31/2019 1428   ALKPHOS 135 (H) 12/31/2019 1428   AST 19 12/31/2019 1428   ALT 19 12/31/2019 1428   BILITOT 0.4 12/31/2019 1428       RADIOGRAPHIC STUDIES: DG Chest 2  View  Result Date: 12/14/2019 CLINICAL DATA:  69 year old male under preoperative evaluation prior to left upper lobectomy. EXAM: CHEST - 2 VIEW COMPARISON:  Chest x-ray 11/11/2009. FINDINGS: Lung volumes are normal. No consolidative airspace disease. No pleural effusions. No pneumothorax. No pulmonary nodule or mass noted. Specifically, known left upper lobe pulmonary nodule is not clearly demonstrated on this plain film examination. Pulmonary vasculature and the cardiomediastinal silhouette are within normal limits. IMPRESSION: 1. No radiographic evidence of acute cardiopulmonary disease. 2. Known left upper lobe nodule not well demonstrated. Electronically Signed   By: Vinnie Langton M.D.   On: 12/14/2019 16:26   DG Chest Port 1 View  Result Date: 12/18/2019 CLINICAL DATA:  Follow-up chest 2 from left side. EXAM: PORTABLE CHEST 1 VIEW COMPARISON:  12/18/2019. FINDINGS: Interval removal of a left chest tube. There is a small left apical pneumothorax. No midline shift. Linear left basilar opacities. No consolidation. No sizable pleural effusion. Cardiomediastinal silhouette is similar to prior and within normal limits. Mild subcutaneous emphysema along the left lateral chest wall inferiorly. No acute osseous abnormality. IMPRESSION: Interval removal of the left chest tube with a small left apical pneumothorax. These results will be called to the ordering clinician or  representative by the Radiologist Assistant, and communication documented in the PACS or [employer]. Electronically Signed   By: Margaretha Sheffield MD   On: 12/18/2019 12:34   DG Chest Port 1 View  Result Date: 12/18/2019 CLINICAL DATA:  69 year old male with LEFT UPPER lobectomy and mediastinal lymph node sampling. EXAM: PORTABLE CHEST 1 VIEW COMPARISON:  12/17/2019 and prior studies FINDINGS: LEFT surgical changes are again noted with LEFT thoracostomy tube again identified. No definite pneumothorax identified. Minimal LEFT basilar atelectasis noted. No large pleural effusion identified. IMPRESSION: Postoperative changes with LEFT thoracostomy tube again noted. No definite pneumothorax. Electronically Signed   By: Margarette Canada M.D.   On: 12/18/2019 07:32   DG Chest Port 1 View  Result Date: 12/17/2019 CLINICAL DATA:  69 year old male with history of pneumothorax. EXAM: PORTABLE CHEST 1 VIEW COMPARISON:  Chest x-ray 12/16/2019. FINDINGS: Status post left upper lobectomy. Compensatory hyperexpansion of the left lower lobe. Left-sided chest tube with tip terminating in the apex of the left upper lobe. No appreciable pneumothorax. No acute consolidative airspace disease. No pleural effusions. No evidence of pulmonary edema. Heart size is normal. Upper mediastinal contours are distorted by patient's positioning. IMPRESSION: 1. Postoperative changes and support apparatus, as above. No pneumothorax noted at this time. Electronically Signed   By: Vinnie Langton M.D.   On: 12/17/2019 10:30   DG Chest Port 1 View  Result Date: 12/16/2019 CLINICAL DATA:  Status post lobectomy. EXAM: PORTABLE CHEST 1 VIEW COMPARISON:  December 14, 2019. FINDINGS: The heart size and mediastinal contours are within normal limits. Right lung is clear. Left-sided chest tube is noted without definite evidence of pneumothorax. No definite effusion is noted. No significant consolidation is noted in the left lung. The visualized skeletal  structures are unremarkable. IMPRESSION: Left-sided chest tube is noted without definite evidence of pneumothorax. Electronically Signed   By: Marijo Conception M.D.   On: 12/16/2019 12:52    ASSESSMENT: This is a very pleasant 69 years old white male recently diagnosed with a stage IIa (T1 a, N1, M0) non-small cell lung cancer, adenocarcinoma diagnosed in June 2021, status post left upper lobectomy with lymph node dissection on December 16, 2019 under the care of Dr. Kipp Brood.  The tumor size measured 0.9 cm  but there was involvement of the level 10 L and 11 L. The patient also has a rectal mass suspicious for malignancy and this has been under investigation by urology and gastroenterology.  PLAN: I had a lengthy discussion with the patient and his wife today about his current disease stage, prognosis and treatment options. I recommended for the patient to complete the staging work-up by ordering MRI of the brain to rule out brain metastasis. I explained to the patient that there is a survival benefit for adjuvant chemotherapy for patient with a stage IIa non-small cell lung cancer. I also discussed with the patient enrollment in the alchemist trial for molecular testing and if the patient has any actionable EGFR or ALK mutation he could benefit from treatment with targeted therapy versus enrollment in the alliance trial for treatment with systemic chemotherapy plus/minus immunotherapy. The patient is interested in the clinical trial and he will be seen by the clinical research nurse later today for evaluation of his eligibility criteria. I will see him back for follow-up visit in 2 weeks for evaluation and discussion of his treatment options based on the molecular studies. For the rectal mass he is scheduled to have a biopsy by urology in around 2 weeks. The patient was advised to call immediately if he has any concerning symptoms in the interval.  The patient voices understanding of current disease  status and treatment options and is in agreement with the current care plan.  All questions were answered. The patient knows to call the clinic with any problems, questions or concerns. We can certainly see the patient much sooner if necessary.  Thank you so much for allowing me to participate in the care of DRYSTAN READER. I will continue to follow up the patient with you and assist in his care.  The total time spent in the appointment was 90 minutes.  Disclaimer: This note was dictated with voice recognition software. Similar sounding words can inadvertently be transcribed and may not be corrected upon review.   Eilleen Kempf December 31, 2019, 3:25 PM

## 2019-12-31 NOTE — Patient Instructions (Signed)
Steps to Quit Smoking Smoking tobacco is the leading cause of preventable death. It can affect almost every organ in the body. Smoking puts you and people around you at risk for many serious, long-lasting (chronic) diseases. Quitting smoking can be hard, but it is one of the best things that you can do for your health. It is never too late to quit. How do I get ready to quit? When you decide to quit smoking, make a plan to help you succeed. Before you quit:  Pick a date to quit. Set a date within the next 2 weeks to give you time to prepare.  Write down the reasons why you are quitting. Keep this list in places where you will see it often.  Tell your family, friends, and co-workers that you are quitting. Their support is important.  Talk with your doctor about the choices that may help you quit.  Find out if your health insurance will pay for these treatments.  Know the people, places, things, and activities that make you want to smoke (triggers). Avoid them. What first steps can I take to quit smoking?  Throw away all cigarettes at home, at work, and in your car.  Throw away the things that you use when you smoke, such as ashtrays and lighters.  Clean your car. Make sure to empty the ashtray.  Clean your home, including curtains and carpets. What can I do to help me quit smoking? Talk with your doctor about taking medicines and seeing a counselor at the same time. You are more likely to succeed when you do both.  If you are pregnant or breastfeeding, talk with your doctor about counseling or other ways to quit smoking. Do not take medicine to help you quit smoking unless your doctor tells you to do so. To quit smoking: Quit right away  Quit smoking totally, instead of slowly cutting back on how much you smoke over a period of time.  Go to counseling. You are more likely to quit if you go to counseling sessions regularly. Take medicine You may take medicines to help you quit. Some  medicines need a prescription, and some you can buy over-the-counter. Some medicines may contain a drug called nicotine to replace the nicotine in cigarettes. Medicines may:  Help you to stop having the desire to smoke (cravings).  Help to stop the problems that come when you stop smoking (withdrawal symptoms). Your doctor may ask you to use:  Nicotine patches, gum, or lozenges.  Nicotine inhalers or sprays.  Non-nicotine medicine that is taken by mouth. Find resources Find resources and other ways to help you quit smoking and remain smoke-free after you quit. These resources are most helpful when you use them often. They include:  Online chats with a counselor.  Phone quitlines.  Printed self-help materials.  Support groups or group counseling.  Text messaging programs.  Mobile phone apps. Use apps on your mobile phone or tablet that can help you stick to your quit plan. There are many free apps for mobile phones and tablets as well as websites. Examples include Quit Guide from the CDC and smokefree.gov  What things can I do to make it easier to quit?   Talk to your family and friends. Ask them to support and encourage you.  Call a phone quitline (1-800-QUIT-NOW), reach out to support groups, or work with a counselor.  Ask people who smoke to not smoke around you.  Avoid places that make you want to smoke,   such as: ? Bars. ? Parties. ? Smoke-break areas at work.  Spend time with people who do not smoke.  Lower the stress in your life. Stress can make you want to smoke. Try these things to help your stress: ? Getting regular exercise. ? Doing deep-breathing exercises. ? Doing yoga. ? Meditating. ? Doing a body scan. To do this, close your eyes, focus on one area of your body at a time from head to toe. Notice which parts of your body are tense. Try to relax the muscles in those areas. How will I feel when I quit smoking? Day 1 to 3 weeks Within the first 24 hours,  you may start to have some problems that come from quitting tobacco. These problems are very bad 2-3 days after you quit, but they do not often last for more than 2-3 weeks. You may get these symptoms:  Mood swings.  Feeling restless, nervous, angry, or annoyed.  Trouble concentrating.  Dizziness.  Strong desire for high-sugar foods and nicotine.  Weight gain.  Trouble pooping (constipation).  Feeling like you may vomit (nausea).  Coughing or a sore throat.  Changes in how the medicines that you take for other issues work in your body.  Depression.  Trouble sleeping (insomnia). Week 3 and afterward After the first 2-3 weeks of quitting, you may start to notice more positive results, such as:  Better sense of smell and taste.  Less coughing and sore throat.  Slower heart rate.  Lower blood pressure.  Clearer skin.  Better breathing.  Fewer sick days. Quitting smoking can be hard. Do not give up if you fail the first time. Some people need to try a few times before they succeed. Do your best to stick to your quit plan, and talk with your doctor if you have any questions or concerns. Summary  Smoking tobacco is the leading cause of preventable death. Quitting smoking can be hard, but it is one of the best things that you can do for your health.  When you decide to quit smoking, make a plan to help you succeed.  Quit smoking right away, not slowly over a period of time.  When you start quitting, seek help from your doctor, family, or friends. This information is not intended to replace advice given to you by your health care provider. Make sure you discuss any questions you have with your health care provider. Document Revised: 12/26/2018 Document Reviewed: 06/21/2018 Elsevier Patient Education  2020 [employer]vier Inc.  

## 2019-12-31 NOTE — Progress Notes (Signed)
DCP-001, Use of a Clinical Trial Screening Tool to Address Cancer Health Disparities in the [place] [facility]) Patient was referred to the [phone] study by Dr. Julien Nordmann and is eligible for the DCP-001 study. I met with patient and spouse this afternoon, after patient's scheduled appointment with Dr. Julien Nordmann. Patient was provided with copies of the DCP-001 consent and authorization forms for his review. The patient is aware that this involves a one-time consent and collection of demographic variables, with the majority of data collected from his medical record. It was noted to the patient that no patient identifiers are being reported via the screening tool. It was also noted to the patient that regardless of his decision to participate in the [id number] study, he is still eligible to participate in DCP-001. Patient was informed that I will follow-up with him, once he has had time to review the information provided to him today. Patient was provided with my contact information and was thanked for his time and encouraged to call me with questions.  Maxwell Marion, RN, BSN, [facility] Clinical Research 12/31/2019 4:50 PM

## 2020-01-01 ENCOUNTER — Telehealth: Payer: Self-pay | Admitting: *Deleted

## 2020-01-01 ENCOUNTER — Telehealth: Payer: Self-pay | Admitting: Internal Medicine

## 2020-01-01 NOTE — Telephone Encounter (Signed)
Alchemist Study; Chart review and correspondence with study chair determined patient is not eligible to enroll on the alchemist study at this time.  Biopsy of a suspicious mass near patient's bladder is pending.  Study chair advised patient is not a good candidate for the study at this time since the mass could be malignant, but we do not know yet. Dr. Julien Nordmann is out of the office today.  Informed Cassie H., PA, of patient not eligible for the study and that tissue will not be sent to the study for molecular testing.  Cassie will arrange for pathology to send tissue for the molecular testing to be done off study per standard of care. Dr. Julien Nordmann notified by email.  Called patient and LVM requesting he call research nurse back so patient could be notified of above.  Foye Spurling, BSN, RN Clinical Research Nurse 01/01/2020 10:21 AM

## 2020-01-01 NOTE — Telephone Encounter (Signed)
Scheduled per los. Called and left msg.

## 2020-01-01 NOTE — Telephone Encounter (Signed)
Per Dr. Julien Nordmann, I updated pathology to send recent pathology for molecular testing and PDL 1 to [facility] one.

## 2020-01-07 ENCOUNTER — Other Ambulatory Visit: Payer: Self-pay | Admitting: Family Medicine

## 2020-01-07 ENCOUNTER — Other Ambulatory Visit: Payer: Self-pay | Admitting: Physician Assistant

## 2020-01-08 ENCOUNTER — Telehealth: Payer: Self-pay | Admitting: General Practice

## 2020-01-08 NOTE — Telephone Encounter (Signed)
[facility] CSW Progress Notes  Call to patient at request of nurse navigator to assess for needs/resources as he begins treatment.  Unable to reach, left VM w information about [place] and encouragement to call back as needed.  Edwyna Shell, LCSW Clinical Social Worker Phone:  [phone]

## 2020-01-09 ENCOUNTER — Other Ambulatory Visit: Payer: Self-pay | Admitting: Physician Assistant

## 2020-01-12 ENCOUNTER — Other Ambulatory Visit: Payer: Self-pay

## 2020-01-12 ENCOUNTER — Ambulatory Visit (HOSPITAL_COMMUNITY)
Admission: RE | Admit: 2020-01-12 | Discharge: 2020-01-12 | Disposition: A | Discharge status: Home / Self Care | Payer: Medicare Other | Source: Ambulatory Visit | Attending: Internal Medicine | Admitting: Internal Medicine

## 2020-01-12 DIAGNOSIS — L723 Sebaceous cyst: Secondary | ICD-10-CM | POA: Diagnosis not present

## 2020-01-12 DIAGNOSIS — C3490 Malignant neoplasm of unspecified part of unspecified bronchus or lung: Secondary | ICD-10-CM

## 2020-01-12 DIAGNOSIS — L989 Disorder of the skin and subcutaneous tissue, unspecified: Secondary | ICD-10-CM | POA: Diagnosis not present

## 2020-01-12 DIAGNOSIS — R220 Localized swelling, mass and lump, head: Secondary | ICD-10-CM | POA: Diagnosis not present

## 2020-01-12 MED ORDER — GADOBUTROL 1 MMOL/ML IV SOLN
8.0000 mL | Freq: Once | INTRAVENOUS | Status: AC | PRN
  Administered 2020-01-12: 8 mL via INTRAVENOUS

## 2020-01-14 ENCOUNTER — Ambulatory Visit: Payer: Medicare Other | Admitting: Internal Medicine

## 2020-01-14 DIAGNOSIS — C49A9 Gastrointestinal stromal tumor of other sites: Secondary | ICD-10-CM | POA: Diagnosis not present

## 2020-01-14 DIAGNOSIS — C3491 Malignant neoplasm of unspecified part of right bronchus or lung: Secondary | ICD-10-CM | POA: Diagnosis not present

## 2020-01-15 ENCOUNTER — Encounter (HOSPITAL_COMMUNITY): Payer: Self-pay | Admitting: Internal Medicine

## 2020-01-18 ENCOUNTER — Encounter (HOSPITAL_COMMUNITY): Payer: Self-pay | Admitting: Internal Medicine

## 2020-01-21 ENCOUNTER — Ambulatory Visit: Payer: Medicare Other | Admitting: Internal Medicine

## 2020-01-22 ENCOUNTER — Encounter: Payer: Self-pay | Admitting: *Deleted

## 2020-01-22 ENCOUNTER — Encounter: Payer: Medicare Other | Admitting: Thoracic Surgery (Cardiothoracic Vascular Surgery)

## 2020-01-22 NOTE — Progress Notes (Signed)
I followed up with [facility] on Jesse [name] results.  Dr. Julien Nordmann sees Jesse [name] next week and I will print these results and place on his desk.

## 2020-01-26 ENCOUNTER — Other Ambulatory Visit: Payer: Self-pay | Admitting: Thoracic Surgery (Cardiothoracic Vascular Surgery)

## 2020-01-26 DIAGNOSIS — C3490 Malignant neoplasm of unspecified part of unspecified bronchus or lung: Secondary | ICD-10-CM

## 2020-01-26 DIAGNOSIS — C678 Malignant neoplasm of overlapping sites of bladder: Secondary | ICD-10-CM | POA: Diagnosis not present

## 2020-01-26 DIAGNOSIS — C49A9 Gastrointestinal stromal tumor of other sites: Secondary | ICD-10-CM | POA: Diagnosis not present

## 2020-01-28 ENCOUNTER — Encounter: Payer: Self-pay | Admitting: Internal Medicine

## 2020-01-28 ENCOUNTER — Other Ambulatory Visit: Payer: Self-pay

## 2020-01-28 ENCOUNTER — Inpatient Hospital Stay: Payer: Medicare Other | Attending: Internal Medicine | Admitting: Internal Medicine

## 2020-01-28 VITALS — BP 144/79 | HR 86 | Temp 97.9°F | Resp 17 | Ht 69.0 in | Wt 168.5 lb

## 2020-01-28 DIAGNOSIS — K219 Gastro-esophageal reflux disease without esophagitis: Secondary | ICD-10-CM | POA: Diagnosis not present

## 2020-01-28 DIAGNOSIS — J449 Chronic obstructive pulmonary disease, unspecified: Secondary | ICD-10-CM | POA: Insufficient documentation

## 2020-01-28 DIAGNOSIS — Z902 Acquired absence of lung [part of]: Secondary | ICD-10-CM | POA: Insufficient documentation

## 2020-01-28 DIAGNOSIS — Z79899 Other long term (current) drug therapy: Secondary | ICD-10-CM | POA: Diagnosis not present

## 2020-01-28 DIAGNOSIS — D649 Anemia, unspecified: Secondary | ICD-10-CM | POA: Insufficient documentation

## 2020-01-28 DIAGNOSIS — Z8551 Personal history of malignant neoplasm of bladder: Secondary | ICD-10-CM | POA: Insufficient documentation

## 2020-01-28 DIAGNOSIS — C3412 Malignant neoplasm of upper lobe, left bronchus or lung: Secondary | ICD-10-CM | POA: Diagnosis not present

## 2020-01-28 DIAGNOSIS — E785 Hyperlipidemia, unspecified: Secondary | ICD-10-CM | POA: Diagnosis not present

## 2020-01-28 DIAGNOSIS — Z7189 Other specified counseling: Secondary | ICD-10-CM

## 2020-01-28 DIAGNOSIS — C3491 Malignant neoplasm of unspecified part of right bronchus or lung: Secondary | ICD-10-CM | POA: Insufficient documentation

## 2020-01-28 DIAGNOSIS — I10 Essential (primary) hypertension: Secondary | ICD-10-CM | POA: Diagnosis not present

## 2020-01-28 DIAGNOSIS — Z7952 Long term (current) use of systemic steroids: Secondary | ICD-10-CM | POA: Diagnosis not present

## 2020-01-28 DIAGNOSIS — C3492 Malignant neoplasm of unspecified part of left bronchus or lung: Secondary | ICD-10-CM

## 2020-01-28 DIAGNOSIS — Z5111 Encounter for antineoplastic chemotherapy: Secondary | ICD-10-CM | POA: Diagnosis not present

## 2020-01-28 MED ORDER — PROCHLORPERAZINE MALEATE 10 MG PO TABS: 10.0000 mg | ORAL_TABLET | Freq: Four times a day (QID) | ORAL | 0 refills | Status: DC | PRN

## 2020-01-28 MED ORDER — DEXAMETHASONE 4 MG PO TABS: ORAL_TABLET | ORAL | 0 refills | Status: DC

## 2020-01-28 MED ORDER — CYANOCOBALAMIN 1000 MCG/ML IJ SOLN
INTRAMUSCULAR | Status: AC
  Filled 2020-01-28: qty 1

## 2020-01-28 MED ORDER — FOLIC ACID 1 MG PO TABS: 1.0000 mg | ORAL_TABLET | Freq: Every day | ORAL | 4 refills | Status: DC

## 2020-01-28 MED ORDER — CYANOCOBALAMIN 1000 MCG/ML IJ SOLN
1000.0000 ug | Freq: Once | INTRAMUSCULAR | Status: AC
  Administered 2020-01-28: 1000 ug via INTRAMUSCULAR

## 2020-01-28 NOTE — Progress Notes (Signed)
[facility] [phone]   [phone]  OFFICE PROGRESS NOTE  [name], MD [place] [address]  DIAGNOSIS: Stage IIB (T1 a, N1, M0) non-small cell lung cancer, adenocarcinoma diagnosed in June 2021.  Biomarker Findings Tumor Mutational Burden - 20 Muts/Mb Microsatellite status - MS-Stable Genomic Findings For a complete list of the genes assayed, please refer to the Appendix. KEAP1 I428f*17 [id number] RB1 splice site 17915+0V>[id number]* 8 Disease relevant genes with no reportable alterations: ALK, BRAF, EGFR, ERBB2, KRAS, MET, RET, ROS1  PDL1 Expression: 1 %  PRIOR THERAPY: status post left upper lobectomy with lymph node dissection on December 16, 2019 under the care of Dr. LKipp Brood  The tumor size measured 0.9 cm but there was involvement of the level 10 L and 11 L.  CURRENT THERAPY: Adjuvant systemic chemotherapy with cisplatin 75 mg/M2 and Alimta 500 mg/M2 every 3 weeks.  First dose February 04, 2020.  INTERVAL HISTORY: Jesse [id number].[id number]. male returns to the clinic today for follow-up visit accompanied by his wife.  The patient is feeling fine today with no concerning complaints except for fatigue.  He was 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  The patient is feeling very well today with no concerning complaints.  He had molecular studies by [facility] that showed no actionable mutations and PD-L1 expression was 1%.  He denied having any chest pain, shortness of breath, cough or hemoptysis.  He denied having any fever or chills.  He has no nausea, vomiting, diarrhea or constipation.  He has no headache or visual changes.  He is here today for evaluation and discussion of his treatment options.  MEDICAL HISTORY: Past Medical History:  Diagnosis Date  . Anemia   . Cancer ([facility]    bladder cancer  . COPD (chronic  obstructive pulmonary disease) (HCC)    mild   . DIVERTICULOSIS, COLON 01/14/2007   Qualifier: Diagnosis of  By: MPaulina FusiRN, JDaine Gravel  . GASTRITIS, CHRONIC 06/01/2004   Qualifier: Diagnosis of  By: SNolon RodCMA (AAMA), Robin    . GERD 01/09/2007   Qualifier: Diagnosis of  By: TScherrie Gerlach   . Heart murmur    as a child; no murmur heard 12/14/19  . HYPERLIPIDEMIA 01/09/2007   Qualifier: Diagnosis of  By: TScherrie Gerlach   . Hypertension   . Lung mass    left upper nodule  . Macular degeneration    right eye  . NEOPLASM, MALIGNANT, BLADDER, HX OF 2002   Qualifier: Diagnosis of  By: SNolon RodCMA (AAMA), Robin  / no chemo or radiation  . OSTEOARTHRITIS 01/14/2007   Qualifier: Diagnosis of  By: MPaulina FusiRN, JDaine Gravel- knees  . Rectal mass   . Reflux esophagitis 06/01/2004   Qualifier: Diagnosis of  By: SNolon RodCMA (AAMA), Robin    . TOBACCO USER 02/16/2009   Qualifier: Diagnosis of  By: KBurnice Logan MD, PDoretha Sou  . VITAMIN D DEFICIENCY 06/03/2007   Qualifier: Diagnosis of  By: KBurnice Logan MD, PDoretha Sou    ALLERGIES:  has No Known Allergies.  MEDICATIONS:  Current Outpatient Medications  Medication Sig Dispense Refill  . acetaminophen (TYLENOL) 500 MG tablet Take 2 tablets (1,000 mg total) by mouth every 6 (six) hours. 30 tablet 0  . amLODipine (NORVASC) 10 MG tablet Take  1 tablet (10 mg total) by mouth daily. 30 tablet 1  . atorvastatin (LIPITOR) 20 MG tablet TAKE 1 TABLET BY MOUTH EVERY DAY 30 tablet 1  . diphenhydrAMINE (BENADRYL) 25 mg capsule Take 1-2 capsules (25-50 mg total) by mouth at bedtime as needed for sleep. 30 capsule 0  . Misc Natural Products (PROSTATE SUPPORT PO) Take 2 capsules by mouth daily. Prostate Revive    . Multiple Vitamins-Minerals (PRESERVISION AREDS 2 PO) Take 1 tablet by mouth in the morning and at bedtime.    . nicotine polacrilex (NICORETTE) 2 MG gum Take 2 mg by mouth 6 (six) times daily.    Marland Kitchen omeprazole (PRILOSEC) 40 MG capsule Take 1 capsule (40  mg total) by mouth daily. (Patient taking differently: Take 40 mg by mouth daily before breakfast. ) 90 capsule 3  . OVER THE COUNTER MEDICATION Take 1 capsule by mouth daily as needed.    . traMADol (ULTRAM) 50 MG tablet Take 1 tablet (50 mg total) by mouth every 6 (six) hours as needed (mild pain). 30 tablet 0   No current facility-administered medications for this visit.    SURGICAL HISTORY:  Past Surgical History:  Procedure Laterality Date  . BRONCHIAL BIOPSY  10/13/2019   Procedure: BRONCHIAL BIOPSIES;  Surgeon: Garner Nash, DO;  Location: [facility];  Service: Pulmonary;;  . BRONCHIAL BRUSHINGS  10/13/2019   Procedure: BRONCHIAL BRUSHINGS;  Surgeon: Garner Nash, DO;  Location: [facility];  Service: Pulmonary;;  . BRONCHIAL NEEDLE ASPIRATION BIOPSY  10/13/2019   Procedure: BRONCHIAL NEEDLE ASPIRATION BIOPSIES;  Surgeon: Garner Nash, DO;  Location: [facility];  Service: Pulmonary;;  . BRONCHIAL WASHINGS  10/13/2019   Procedure: BRONCHIAL WASHINGS;  Surgeon: Garner Nash, DO;  Location: [facility];  Service: Pulmonary;;  . COLONOSCOPY     multiple  . INTERCOSTAL NERVE BLOCK Left 12/16/2019   Procedure: INTERCOSTAL NERVE BLOCK;  Surgeon: Lajuana Matte, MD;  Location: [facility];  Service: Thoracic;  Laterality: Left;  . KNEE SURGERY  07/20/2008   bilat.  Marland Kitchen NASAL SEPTUM SURGERY  1995  . NODE DISSECTION Left 12/16/2019   Procedure: NODE DISSECTION;  Surgeon: Lajuana Matte, MD;  Location: [facility];  Service: Thoracic;  Laterality: Left;  . TONSILLECTOMY    . TRANSURETHRAL RESECTION OF BLADDER TUMOR  2002  . UPPER GASTROINTESTINAL ENDOSCOPY    . VIDEO BRONCHOSCOPY WITH ENDOBRONCHIAL NAVIGATION N/A 10/13/2019   Procedure: VIDEO BRONCHOSCOPY WITH ENDOBRONCHIAL NAVIGATION;  Surgeon: Garner Nash, DO;  Location: [facility];  Service: Pulmonary;  Laterality: N/A;  . WISDOM TOOTH EXTRACTION      REVIEW OF SYSTEMS:  Constitutional: negative Eyes:  negative Ears, nose, mouth, throat, and face: negative Respiratory: positive for dyspnea on exertion Cardiovascular: negative Gastrointestinal: negative Genitourinary:negative Integument/breast: negative Hematologic/lymphatic: negative Musculoskeletal:negative Neurological: negative Behavioral/Psych: negative Endocrine: negative Allergic/Immunologic: negative   PHYSICAL EXAMINATION: General appearance: alert, cooperative and no distress Head: Normocephalic, without obvious abnormality, atraumatic Neck: no adenopathy, no JVD, supple, symmetrical, trachea midline and thyroid not enlarged, symmetric, no tenderness/mass/nodules Lymph nodes: Cervical, supraclavicular, and axillary nodes normal. Resp: clear to auscultation bilaterally Back: symmetric, no curvature. ROM normal. No CVA tenderness. Cardio: regular rate and rhythm, S1, S2 normal, no murmur, click, rub or gallop GI: soft, non-tender; bowel sounds normal; no masses,  no organomegaly Extremities: extremities normal, atraumatic, no cyanosis or edema Neurologic: Alert and oriented X 3, normal strength and tone. Normal symmetric reflexes. Normal coordination and gait  ECOG PERFORMANCE STATUS: 1 -  Symptomatic but completely ambulatory  Blood pressure (!) 144/79, pulse 86, temperature 97.9 F (36.6 C), temperature source Tympanic, resp. rate 17, height '5\' 9"'  (1.753 m), weight 168 lb 8 oz (76.4 kg), SpO2 100 %.  LABORATORY DATA: Lab Results  Component Value Date   WBC 8.4 12/31/2019   HGB 13.4 12/31/2019   HCT 39.4 12/31/2019   MCV 95.9 12/31/2019   PLT 254 12/31/2019      Chemistry      Component Value Date/Time   NA 140 12/31/2019 1428   K 4.1 12/31/2019 1428   CL 104 12/31/2019 1428   CO2 26 12/31/2019 1428   BUN 21 12/31/2019 1428   CREATININE 0.88 12/31/2019 1428      Component Value Date/Time   CALCIUM 10.1 12/31/2019 1428   ALKPHOS 135 (H) 12/31/2019 1428   AST 19 12/31/2019 1428   ALT 19 12/31/2019 1428    BILITOT 0.4 12/31/2019 1428       RADIOGRAPHIC STUDIES: MR BRAIN W WO CONTRAST  Result Date: 01/13/2020 CLINICAL DATA:  69 year old male with non-small cell lung cancer diagnosed this summer. Staging. EXAM: MRI HEAD WITHOUT AND WITH CONTRAST TECHNIQUE: Multiplanar, multiecho pulse sequences of the brain and surrounding structures were obtained without and with intravenous contrast. CONTRAST:  25m GADAVIST GADOBUTROL 1 MMOL/ML IV SOLN COMPARISON:  PET-CT 09/16/2019. FINDINGS: Brain: No midline shift, mass effect, or evidence of intracranial mass lesion. No abnormal enhancement identified. No dural thickening. Cerebral volume is within normal limits for age. No restricted diffusion to suggest acute infarction. No ventriculomegaly, extra-axial collection or acute intracranial hemorrhage. Cervicomedullary junction and pituitary are within normal limits. GPearline Cablesand white matter signal is within normal limits for age throughout the brain. No encephalomalacia or chronic cerebral blood products identified. Vascular: Major intracranial vascular flow voids are preserved. The major dural venous sinuses are enhancing and appear to be patent. Skull and upper cervical spine: Negative visible cervical spine aside from some degeneration. Visualized bone marrow signal is within normal limits. Sinuses/Orbits: Negative. Other: There is a sessile 10 mm skin lesion of the left lateral scalp (series 11, image 38). Also along the left face there is a tiny probable subcutaneous sebaceous cyst with conspicuous diffusion (series 11, image 12). Mastoids are well pneumatized. Grossly normal visible internal auditory structures. IMPRESSION: 1. No metastatic disease or acute intracranial abnormality. Normal for age MRI appearance of the brain. 2. Recommend clinical exam follow-up of a 10 mm left lateral scalp sessile skin lesion (series 11, image 38). Electronically Signed   By: HGenevie AnnM.D.   On: 01/13/2020 09:06    ASSESSMENT AND  PLAN: This is a very pleasant 69years old white male recently diagnosed with a stage IIb (T1 a, N1, M0) non-small cell lung cancer, adenocarcinoma in June 2021 status post left upper lobectomy with lymph node dissection on December 16, 2019 under the care of Dr. LKipp Brood  The patient has no actionable mutations and PD-L1 expression was 1%. He was also diagnosed with pelvic GIST tumor. I had a lengthy discussion with the patient and his wife today about his current disease stage, prognosis and treatment options. I explained to the patient that adjuvant systemic chemotherapy for patient with a stage IIb non-small cell lung cancer could have an absolute increase and the 5-year survival by around 10%. The patient was given the option of observation versus adjuvant treatment with 4 cycles of platinum based systemic chemotherapy.  He is interested in treatment.  I recommended for him  treatment with cisplatin 75 mg/M2 and Alimta 500 mg/M2 every 3 weeks.  If the patient has intolerability to the cisplatin regimen, we will switch him to carboplatin. I discussed with him the adverse effect of this treatment including but not limited to alopecia, myelosuppression, nausea and vomiting, peripheral neuropathy, liver or renal dysfunction as well as hearing deficit. The patient is expected to start the first cycle of this treatment next week. He will have a chemotherapy education class before the first dose of his treatment. The patient will receive vitamin B12 injection today. I will call his pharmacy with prescription for Compazine 10 mg p.o. every 6 hours as needed for nausea, folic acid 1 mg p.o. daily and Decadron 4 mg p.o. twice daily the day before, day of and day after chemotherapy every 3 weeks. The patient will come back for follow-up visit in 2 weeks for evaluation and management of any adverse effect of his treatment. For the GIST, we will monitor it for now until completion of the adjuvant chemotherapy  for the lung cancer.  We may consider the patient for treatment with neoadjuvant Gleevec followed by surgical resection after his systemic chemotherapy for the lung cancer. The patient was advised to call immediately if he has any other concerning symptoms in the interval. The patient voices understanding of current disease status and treatment options and is in agreement with the current care plan.  All questions were answered. The patient knows to call the clinic with any problems, questions or concerns. We can certainly see the patient much sooner if necessary. The total time spent in the appointment was 50 minutes.  Disclaimer: This note was dictated with voice recognition software. Similar sounding words can inadvertently be transcribed and may not be corrected upon review.

## 2020-01-28 NOTE — Progress Notes (Signed)
START ON PATHWAY REGIMEN - Non-Small Cell Lung     A cycle is every 21 days:     Pemetrexed      Cisplatin   **Always confirm dose/schedule in your pharmacy ordering system**  Patient Characteristics: Postoperative without Neoadjuvant Therapy (Pathologic Staging), Stage IIB, Adjuvant Chemotherapy, Nonsquamous Cell Therapeutic Status: Postoperative without Neoadjuvant Therapy (Pathologic Staging) AJCC T Category: pT1a AJCC N Category: pN1 AJCC M Category: cM0 AJCC 8 Stage Grouping: IIB Histology: Nonsquamous Cell Intent of Therapy: Curative Intent, Discussed with Patient

## 2020-01-29 ENCOUNTER — Encounter: Payer: Self-pay | Admitting: Thoracic Surgery (Cardiothoracic Vascular Surgery)

## 2020-01-29 ENCOUNTER — Ambulatory Visit
Admission: RE | Admit: 2020-01-29 | Discharge: 2020-01-29 | Disposition: A | Discharge status: Home / Self Care | Payer: Medicare Other | Source: Ambulatory Visit | Attending: Thoracic Surgery (Cardiothoracic Vascular Surgery) | Admitting: Thoracic Surgery (Cardiothoracic Vascular Surgery)

## 2020-01-29 ENCOUNTER — Ambulatory Visit (INDEPENDENT_AMBULATORY_CARE_PROVIDER_SITE_OTHER): Payer: Self-pay | Admitting: Thoracic Surgery (Cardiothoracic Vascular Surgery)

## 2020-01-29 VITALS — BP 150/90 | HR 90 | Temp 97.9°F | Resp 20 | Ht 69.0 in | Wt 168.0 lb

## 2020-01-29 DIAGNOSIS — C3412 Malignant neoplasm of upper lobe, left bronchus or lung: Secondary | ICD-10-CM

## 2020-01-29 DIAGNOSIS — C3490 Malignant neoplasm of unspecified part of unspecified bronchus or lung: Secondary | ICD-10-CM

## 2020-01-29 DIAGNOSIS — Z09 Encounter for follow-up examination after completed treatment for conditions other than malignant neoplasm: Secondary | ICD-10-CM

## 2020-01-29 DIAGNOSIS — Z85118 Personal history of other malignant neoplasm of bronchus and lung: Secondary | ICD-10-CM | POA: Diagnosis not present

## 2020-01-29 DIAGNOSIS — J939 Pneumothorax, unspecified: Secondary | ICD-10-CM | POA: Diagnosis not present

## 2020-01-29 NOTE — Progress Notes (Signed)
° °   °  [address]       [place] [address]             [phone]        Evin H Elbert  Medical Record #[id number] Date of Birth: [birth year]  Referring: Garner Nash, DO Primary Care: [name], MD Primary Cardiologist:No primary care provider on file.  Reason for visit:   follow-up  History of Present Illness:     Mr. Sales comes in for his 1 month appointment.  He is doing quite well.  He states that he walks about 11-8998 steps daily minimal respiratory symptoms.  He denies any pain at any of his incisions.  Physical Exam: BP (!) 150/90    Pulse 90    Temp 97.9 F (36.6 C) (Skin)    Resp 20    Ht 5\' 9"  (1.753 m)    Wt 168 lb (76.2 kg)    SpO2 96% Comment: RA   BMI 24.81 kg/m   Alert NAD Incision clean. * Abdomen soft, ND No peripheral edema   Diagnostic Studies & Laboratory data: CXR: Clear     Assessment / Plan:   69 year old male with a T1 N1 M0 stage IIb adenocarcinoma of the left upper lobe.  He also underwent biopsy of his pelvic mass and this was consistent with a GIST tumor.  In regards to his lung cancer he is scheduled to start adjuvant therapy in the coming weeks.  Continued surveillance will occur with Dr. Julien Nordmann.  Follow-up with me as needed.   Lajuana Matte 01/29/2020 12:14 PM

## 2020-02-01 ENCOUNTER — Inpatient Hospital Stay: Payer: Medicare Other

## 2020-02-01 ENCOUNTER — Other Ambulatory Visit: Payer: Self-pay

## 2020-02-01 NOTE — Research (Signed)
error 

## 2020-02-01 NOTE — Progress Notes (Signed)
Pharmacist Chemotherapy Monitoring - Initial Assessment    Anticipated start date: 02/05/2020   Regimen:  . Are orders appropriate based on the patient's diagnosis, regimen, and cycle? Yes . Does the plan date match the patient's scheduled date? Yes . Is the sequencing of drugs appropriate? Yes . Are the premedications appropriate for the patient's regimen? Yes . Prior Authorization for treatment is: Not Started o If applicable, is the correct biosimilar selected based on the patient's insurance? not applicable  Organ Function and Labs: Marland Kitchen Are dose adjustments needed based on the patient's renal function, hepatic function, or hematologic function? Yes . Are appropriate labs ordered prior to the start of patient's treatment? Yes . Other organ system assessment, if indicated: N/A . The following baseline labs, if indicated, have been ordered: N/A  Dose Assessment: . Are the drug doses appropriate? Yes  . Are the following correct: o Drug concentrations yes  o IV fluid compatible with drug Yes o Administration routes Yes o Timing of therapy Yes . If applicable, does the patient have documented access for treatment and/or plans for port-a-cath placement? no . If applicable, have lifetime cumulative doses been properly documented and assessed? not applicable Lifetime Dose Tracking  No doses have been documented on this patient for the following tracked chemicals: Doxorubicin, Epirubicin, Idarubicin, Daunorubicin, Mitoxantrone, Bleomycin, Oxaliplatin, Carboplatin, Liposomal Doxorubicin  o   Toxicity Monitoring/Prevention: . The patient has the following take home antiemetics prescribed: Prochlorperazine and Dexamethasone . The patient has the following take home medications prescribed: B12 for pemetrexed and folic acid for pemetrexed . Medication allergies and previous infusion related reactions, if applicable, have been reviewed and addressed. No . The patient's current medication list  has been assessed for drug-drug interactions with their chemotherapy regimen. no significant drug-drug interactions were identified on review.  Order Review: . Are the treatment plan orders signed? Yes . Is the patient scheduled to see a provider prior to their treatment? Yes  I verify that I have reviewed each item in the above checklist and answered each question accordingly.  Stepehn,Laneice Meneely D 02/01/2020 4:15 PM

## 2020-02-02 ENCOUNTER — Encounter: Payer: Self-pay | Admitting: Internal Medicine

## 2020-02-05 ENCOUNTER — Other Ambulatory Visit: Payer: Self-pay | Admitting: Physician Assistant

## 2020-02-05 ENCOUNTER — Other Ambulatory Visit: Payer: Self-pay

## 2020-02-05 ENCOUNTER — Inpatient Hospital Stay: Payer: Medicare Other

## 2020-02-05 VITALS — BP 136/72 | HR 86 | Temp 98.2°F | Resp 18

## 2020-02-05 DIAGNOSIS — D649 Anemia, unspecified: Secondary | ICD-10-CM | POA: Diagnosis not present

## 2020-02-05 DIAGNOSIS — E785 Hyperlipidemia, unspecified: Secondary | ICD-10-CM | POA: Diagnosis not present

## 2020-02-05 DIAGNOSIS — J449 Chronic obstructive pulmonary disease, unspecified: Secondary | ICD-10-CM | POA: Diagnosis not present

## 2020-02-05 DIAGNOSIS — C3412 Malignant neoplasm of upper lobe, left bronchus or lung: Secondary | ICD-10-CM | POA: Diagnosis not present

## 2020-02-05 DIAGNOSIS — Z902 Acquired absence of lung [part of]: Secondary | ICD-10-CM | POA: Diagnosis not present

## 2020-02-05 DIAGNOSIS — C3492 Malignant neoplasm of unspecified part of left bronchus or lung: Secondary | ICD-10-CM

## 2020-02-05 DIAGNOSIS — I10 Essential (primary) hypertension: Secondary | ICD-10-CM | POA: Diagnosis not present

## 2020-02-05 DIAGNOSIS — K219 Gastro-esophageal reflux disease without esophagitis: Secondary | ICD-10-CM | POA: Diagnosis not present

## 2020-02-05 DIAGNOSIS — Z5111 Encounter for antineoplastic chemotherapy: Secondary | ICD-10-CM | POA: Diagnosis not present

## 2020-02-05 DIAGNOSIS — Z8551 Personal history of malignant neoplasm of bladder: Secondary | ICD-10-CM | POA: Diagnosis not present

## 2020-02-05 DIAGNOSIS — Z79899 Other long term (current) drug therapy: Secondary | ICD-10-CM | POA: Diagnosis not present

## 2020-02-05 DIAGNOSIS — Z7952 Long term (current) use of systemic steroids: Secondary | ICD-10-CM | POA: Diagnosis not present

## 2020-02-05 LAB — CBC WITH DIFFERENTIAL (CANCER CENTER ONLY)
Abs Immature Granulocytes: 0.03 10*3/uL (ref 0.00–0.07)
Basophils Absolute: 0 10*3/uL (ref 0.0–0.1)
Basophils Relative: 0 %
Eosinophils Absolute: 0 10*3/uL (ref 0.0–0.5)
Eosinophils Relative: 0 %
HCT: 37.8 % — ABNORMAL LOW (ref 39.0–52.0)
Hemoglobin: 13.2 g/dL (ref 13.0–17.0)
Immature Granulocytes: 0 %
Lymphocytes Relative: 10 %
Lymphs Abs: 0.8 10*3/uL (ref 0.7–4.0)
MCH: 32.8 pg (ref 26.0–34.0)
MCHC: 34.9 g/dL (ref 30.0–36.0)
MCV: 93.8 fL (ref 80.0–100.0)
Monocytes Absolute: 0.3 10*3/uL (ref 0.1–1.0)
Monocytes Relative: 3 %
Neutro Abs: 7.1 10*3/uL (ref 1.7–7.7)
Neutrophils Relative %: 87 %
Platelet Count: 181 10*3/uL (ref 150–400)
RBC: 4.03 MIL/uL — ABNORMAL LOW (ref 4.22–5.81)
RDW: 12.8 % (ref 11.5–15.5)
WBC Count: 8.2 10*3/uL (ref 4.0–10.5)
nRBC: 0 % (ref 0.0–0.2)

## 2020-02-05 LAB — CMP (CANCER CENTER ONLY)
ALT: 12 U/L (ref 0–44)
AST: 17 U/L (ref 15–41)
Albumin: 4.1 g/dL (ref 3.5–5.0)
Alkaline Phosphatase: 103 U/L (ref 38–126)
Anion gap: 8 (ref 5–15)
BUN: 21 mg/dL (ref 8–23)
CO2: 24 mmol/L (ref 22–32)
Calcium: 10 mg/dL (ref 8.9–10.3)
Chloride: 105 mmol/L (ref 98–111)
Creatinine: 0.78 mg/dL (ref 0.61–1.24)
GFR, Estimated: >60 mL/min (ref >60)
Glucose, Bld: 163 mg/dL — ABNORMAL HIGH (ref 70–99)
Potassium: 3.9 mmol/L (ref 3.5–5.1)
Sodium: 137 mmol/L (ref 135–145)
Total Bilirubin: 0.5 mg/dL (ref 0.3–1.2)
Total Protein: 7.1 g/dL (ref 6.5–8.1)

## 2020-02-05 LAB — MAGNESIUM: Magnesium: 1.4 mg/dL — CL (ref 1.7–2.4)

## 2020-02-05 MED ORDER — MAGNESIUM OXIDE 400 (241.3 MG) MG PO TABS: 400.0000 mg | ORAL_TABLET | Freq: Two times a day (BID) | ORAL | 2 refills | Status: DC

## 2020-02-05 MED ORDER — SODIUM CHLORIDE 0.9 % IV SOLN
Freq: Once | INTRAVENOUS | Status: AC
  Filled 2020-02-05: qty 10

## 2020-02-05 MED ORDER — SODIUM CHLORIDE 0.9 % IV SOLN
150.0000 mg | Freq: Once | INTRAVENOUS | Status: AC
  Administered 2020-02-05: 150 mg via INTRAVENOUS
  Filled 2020-02-05: qty 150

## 2020-02-05 MED ORDER — SODIUM CHLORIDE 0.9 % IV SOLN
500.0000 mg/m2 | Freq: Once | INTRAVENOUS | Status: AC
  Administered 2020-02-05: 1000 mg via INTRAVENOUS
  Filled 2020-02-05: qty 40

## 2020-02-05 MED ORDER — SODIUM CHLORIDE 0.9 % IV SOLN
75.0000 mg/m2 | Freq: Once | INTRAVENOUS | Status: AC
  Administered 2020-02-05: 145 mg via INTRAVENOUS
  Filled 2020-02-05: qty 145

## 2020-02-05 MED ORDER — SODIUM CHLORIDE 0.9 % IV SOLN
10.0000 mg | Freq: Once | INTRAVENOUS | Status: AC
  Administered 2020-02-05: 10 mg via INTRAVENOUS
  Filled 2020-02-05: qty 10

## 2020-02-05 MED ORDER — PALONOSETRON HCL INJECTION 0.25 MG/5ML
INTRAVENOUS | Status: AC
  Filled 2020-02-05: qty 5

## 2020-02-05 MED ORDER — PALONOSETRON HCL INJECTION 0.25 MG/5ML
0.2500 mg | Freq: Once | INTRAVENOUS | Status: AC
  Administered 2020-02-05: 0.25 mg via INTRAVENOUS

## 2020-02-05 MED ORDER — SODIUM CHLORIDE 0.9 % IV SOLN
Freq: Once | INTRAVENOUS | Status: AC
  Filled 2020-02-05: qty 250

## 2020-02-05 NOTE — Patient Instructions (Signed)
[facility] Discharge Instructions for Patients Receiving Chemotherapy  Today you received the following chemotherapy agents  Alimta, Cisplatin   To help prevent nausea and vomiting after your treatment, we encourage you to take your nausea medication as directed by MD    If you develop nausea and vomiting that is not controlled by your nausea medication, call the clinic.   BELOW ARE SYMPTOMS THAT SHOULD BE REPORTED IMMEDIATELY:  *FEVER GREATER THAN 100.5 F  *CHILLS WITH OR WITHOUT FEVER  NAUSEA AND VOMITING THAT IS NOT CONTROLLED WITH YOUR NAUSEA MEDICATION  *UNUSUAL SHORTNESS OF BREATH  *UNUSUAL BRUISING OR BLEEDING  TENDERNESS IN MOUTH AND THROAT WITH OR WITHOUT PRESENCE OF ULCERS  *URINARY PROBLEMS  *BOWEL PROBLEMS  UNUSUAL RASH Items with * indicate a potential emergency and should be followed up as soon as possible.  Feel free to call the clinic should you have any questions or concerns. The clinic phone number is [phone].  Please show the [facility] at check-in to the Emergency Department and triage nurse.

## 2020-02-06 ENCOUNTER — Other Ambulatory Visit: Payer: Self-pay | Admitting: Internal Medicine

## 2020-02-08 ENCOUNTER — Telehealth: Payer: Self-pay | Admitting: Medical Oncology

## 2020-02-08 NOTE — Telephone Encounter (Signed)
Difficulty with having BM . Instructed to take 1/2 bottle of Magnesium citrate. If no results in one hour take the other 1/2 of bottle.

## 2020-02-09 NOTE — Progress Notes (Signed)
[name] OFFICE PROGRESS NOTE  Jesse Ruddy, MD [name]  DIAGNOSIS: Stage IIB (T1 a, N1, M0) non-small cell lung cancer, adenocarcinoma diagnosed in June 2021.  Biomarker Findings Tumor Mutational Burden - 20 Muts/Mb Microsatellite status - MS-Stable Genomic Findings For a complete list of the genes assayed, please refer to the Appendix. KEAP1 I472f*17 [id number] RB1 splice site 16440+3K>[id number]* 8 Disease relevant genes with no reportable alterations: ALK, BRAF, EGFR, ERBB2, KRAS, MET, RET, ROS1  PDL1 Expression: 1 %  PRIOR THERAPY: status post left upper lobectomy with lymph node dissection on December 16, 2019 under the care of Dr. LKipp Brood The tumor size measured 0.9 cm but there was involvement of the level 10 L and 11 L.  CURRENT THERAPY: Adjuvant systemic chemotherapy with cisplatin 75 mg/M2 and Alimta 500 mg/M2 every 3 weeks.  First dose February 04, 2020. Status post 1 cycle.   INTERVAL HISTORY: Jesse [id number]. [name] returns to the clinic today for a follow-up visit. The patient is feeling well today without any concerning complaints. The patient is status post tumor resection and is currently undergoing adjuvant chemotherapy.  He underwent his first cycle of treatment last week and he tolerated it well without any adverse side effects except for fatigue, decreased appetite, and taste alterations. The patient is drinking plenty of fluids and is staying active. He did not lose weight. The patient experienced constipation following his treatment for which he took a laxative with magnesium citrate which resolved his symptoms. Otherwise he denies any fever, chills, night sweats. He denies any chest pain, shortness of breath, cough, or hemoptysis.  He denies any nausea, vomiting, or diarrhea.  He is taking his magnesium oxide 400 mg BID as prescribed. He denies any headache or visual changes.  The patient is  here today for evaluation, repeat lab work, and a 1 week follow-up visit to manage any adverse side effects of treatment.  MEDICAL HISTORY: Past Medical History:  Diagnosis Date  . Anemia   . Cancer ([facility]    bladder cancer  . COPD (chronic obstructive pulmonary disease) (HCC)    mild   . DIVERTICULOSIS, COLON 01/14/2007   Qualifier: Diagnosis of  By: MPaulina FusiRN, JDaine Gravel  . GASTRITIS, CHRONIC 06/01/2004   Qualifier: Diagnosis of  By: SNolon RodCMA (AAMA), Robin    . GERD 01/09/2007   Qualifier: Diagnosis of  By: TScherrie Gerlach   . Heart murmur    as a child; no murmur heard 12/14/19  . HYPERLIPIDEMIA 01/09/2007   Qualifier: Diagnosis of  By: TScherrie Gerlach   . Hypertension   . Lung mass    left upper nodule  . Macular degeneration    right eye  . NEOPLASM, MALIGNANT, BLADDER, HX OF 2002   Qualifier: Diagnosis of  By: SNolon RodCMA (AAMA), Robin  / no chemo or radiation  . OSTEOARTHRITIS 01/14/2007   Qualifier: Diagnosis of  By: MPaulina FusiRN, JDaine Gravel- knees  . Rectal mass   . Reflux esophagitis 06/01/2004   Qualifier: Diagnosis of  By: SNolon RodCMA (AAMA), Robin    . TOBACCO USER 02/16/2009   Qualifier: Diagnosis of  By: KBurnice Logan MD, PDoretha Sou  . VITAMIN D DEFICIENCY 06/03/2007   Qualifier: Diagnosis of  By: KBurnice Logan MD, PDoretha Sou    ALLERGIES:  has No Known Allergies.  MEDICATIONS:  Current Outpatient Medications  Medication Sig Dispense  Refill  . amLODipine (NORVASC) 10 MG tablet Take 1 tablet (10 mg total) by mouth daily. (Patient taking differently: Take 5 mg by mouth daily. ) 30 tablet 1  . atorvastatin (LIPITOR) 40 MG tablet Take 40 mg by mouth daily.    Marland Kitchen dexamethasone (DECADRON) 4 MG tablet 1 tablet p.o. twice daily the day before, day of and day after chemotherapy every 3 weeks. 30 tablet 0  . diphenhydrAMINE (BENADRYL) 25 mg capsule Take 1-2 capsules (25-50 mg total) by mouth at bedtime as needed for sleep. 30 capsule 0  . folic acid (FOLVITE) 1 MG tablet  Take 1 tablet (1 mg total) by mouth daily. 30 tablet 4  . magnesium oxide (MAG-OX) 400 MG tablet Take 1 tablet by mouth 2 (two) times daily.    Marland Kitchen omeprazole (PRILOSEC) 40 MG capsule Take 1 capsule (40 mg total) by mouth daily. (Patient taking differently: Take 40 mg by mouth daily before breakfast. ) 90 capsule 3  . prochlorperazine (COMPAZINE) 10 MG tablet Take 1 tablet (10 mg total) by mouth every 6 (six) hours as needed for nausea or vomiting. 30 tablet 0   No current facility-administered medications for this visit.    SURGICAL HISTORY:  Past Surgical History:  Procedure Laterality Date  . BRONCHIAL BIOPSY  10/13/2019   Procedure: BRONCHIAL BIOPSIES;  Surgeon: Garner Nash, DO;  Location: [facility];  Service: Pulmonary;;  . BRONCHIAL BRUSHINGS  10/13/2019   Procedure: BRONCHIAL BRUSHINGS;  Surgeon: Garner Nash, DO;  Location: [place] [facility];  Service: Pulmonary;;  . BRONCHIAL NEEDLE ASPIRATION BIOPSY  10/13/2019   Procedure: BRONCHIAL NEEDLE ASPIRATION BIOPSIES;  Surgeon: Garner Nash, DO;  Location: [place] [facility];  Service: Pulmonary;;  . BRONCHIAL WASHINGS  10/13/2019   Procedure: BRONCHIAL WASHINGS;  Surgeon: Garner Nash, DO;  Location: [place] [facility];  Service: Pulmonary;;  . COLONOSCOPY     multiple  . INTERCOSTAL NERVE BLOCK Left 12/16/2019   Procedure: INTERCOSTAL NERVE BLOCK;  Surgeon: Lajuana Matte, MD;  Location: [place];  Service: Thoracic;  Laterality: Left;  . KNEE SURGERY  07/20/2008   bilat.  Marland Kitchen NASAL SEPTUM SURGERY  1995  . NODE DISSECTION Left 12/16/2019   Procedure: NODE DISSECTION;  Surgeon: Lajuana Matte, MD;  Location: [place];  Service: Thoracic;  Laterality: Left;  . TONSILLECTOMY    . TRANSURETHRAL RESECTION OF BLADDER TUMOR  2002  . UPPER GASTROINTESTINAL ENDOSCOPY    . VIDEO BRONCHOSCOPY WITH ENDOBRONCHIAL NAVIGATION N/A 10/13/2019   Procedure: VIDEO BRONCHOSCOPY WITH ENDOBRONCHIAL NAVIGATION;  Surgeon: Garner Nash, DO;  Location: [place];  Service: Pulmonary;  Laterality: N/A;  . WISDOM TOOTH EXTRACTION      REVIEW OF SYSTEMS:   Review of Systems  Constitutional: Positive for fatigue, taste alterations, and decreased appetite.  Negative for chills, fever and unexpected weight change.  HENT: Positive for taste alterations.  Negative for mouth sores, nosebleeds, sore throat and trouble swallowing.   Eyes: Negative for eye problems and icterus.  Respiratory: Negative for cough, hemoptysis, shortness of breath and wheezing.   Cardiovascular: Negative for chest pain and leg swelling.  Gastrointestinal:  Positive for constipation (resolved )egative for abdominal pain, diarrhea, nausea and vomiting.  Genitourinary: Negative for bladder incontinence, difficulty urinating, dysuria, frequency and hematuria.   Musculoskeletal: Negative for back pain, gait problem, neck pain and neck stiffness.  Skin: Negative for itching and rash.  Neurological: Negative for dizziness, extremity weakness, gait problem, headaches, light-headedness and seizures.  Hematological: Negative  for adenopathy. Does not bruise/bleed easily.  Psychiatric/Behavioral: Negative for confusion, depression and sleep disturbance. The patient is not nervous/anxious.     PHYSICAL EXAMINATION:  Blood pressure (!) 155/83, pulse 81, temperature (!) 97 F (36.1 C), temperature source Tympanic, resp. rate 18, weight 169 lb 8 oz (76.9 kg), SpO2 99 %.  ECOG PERFORMANCE STATUS: 1 - Symptomatic but completely ambulatory  Physical Exam  Constitutional: Oriented to person, place, and time and well-developed, well-nourished, and in no distress.  HENT:  Head: Normocephalic and atraumatic.  Mouth/Throat: Oropharynx is clear and moist. No oropharyngeal exudate.  Eyes: Conjunctivae are normal. Right eye exhibits no discharge. Left eye exhibits no discharge. No scleral icterus.  Neck: Normal range of motion. Neck supple.  Cardiovascular: Normal rate, regular rhythm,  normal heart sounds and intact distal pulses.   Pulmonary/Chest: Effort normal and breath sounds normal. No respiratory distress. No wheezes. No rales.  Abdominal: Soft. Bowel sounds are normal. Exhibits no distension and no mass. There is no tenderness.  Musculoskeletal: Normal range of motion. Exhibits no edema.  Lymphadenopathy:    No cervical adenopathy.  Neurological: Alert and oriented to person, place, and time. Exhibits normal muscle tone. Gait normal. Coordination normal.  Skin: Skin is warm and dry. No rash noted. Not diaphoretic. No erythema. No pallor.  Psychiatric: Mood, memory and judgment normal.  Vitals reviewed.  LABORATORY DATA: Lab Results  Component Value Date   WBC 4.8 02/11/2020   HGB 12.6 (L) 02/11/2020   HCT 37.3 (L) 02/11/2020   MCV 95.2 02/11/2020   PLT 130 (L) 02/11/2020      Chemistry      Component Value Date/Time   NA 137 02/11/2020 0950   K 4.1 02/11/2020 0950   CL 100 02/11/2020 0950   CO2 29 02/11/2020 0950   BUN 18 02/11/2020 0950   CREATININE 0.77 02/11/2020 0950      Component Value Date/Time   CALCIUM 9.6 02/11/2020 0950   ALKPHOS 83 02/11/2020 0950   AST 18 02/11/2020 0950   ALT 16 02/11/2020 0950   BILITOT 0.8 02/11/2020 0950       RADIOGRAPHIC STUDIES:  DG Chest 2 View  Result Date: 01/29/2020 CLINICAL DATA:  History of lung cancer. Patient status post left upper lobectomy 12/16/2019 with a small postoperative pneumothorax. EXAM: CHEST - 2 VIEW COMPARISON:  Single-view of the chest 12/18/2019. FINDINGS: Again seen is volume loss in the left chest and suture material at the left hilum consistent with postoperative change. Small left pneumothorax has resolved. Lungs are clear. Heart size is normal. No pleural fluid. No acute or focal bony abnormality. IMPRESSION: Postoperative change left chest. Small left pneumothorax seen on the prior exam is resolved. No acute abnormality. Electronically Signed   By: Inge Rise M.D.   On:  01/29/2020 11:31   MR BRAIN W WO CONTRAST  Result Date: 01/13/2020 CLINICAL DATA:  69 year old [name] with non-small cell lung cancer diagnosed this summer. Staging. EXAM: MRI HEAD WITHOUT AND WITH CONTRAST TECHNIQUE: Multiplanar, multiecho pulse sequences of the brain and surrounding structures were obtained without and with intravenous contrast. CONTRAST:  84m GADAVIST GADOBUTROL 1 MMOL/ML IV SOLN COMPARISON:  PET-CT 09/16/2019. FINDINGS: Brain: No midline shift, mass effect, or evidence of intracranial mass lesion. No abnormal enhancement identified. No dural thickening. Cerebral volume is within normal limits for age. No restricted diffusion to suggest acute infarction. No ventriculomegaly, extra-axial collection or acute intracranial hemorrhage. Cervicomedullary junction and pituitary are within normal limits. GPearline Cablesand white matter  signal is within normal limits for age throughout the brain. No encephalomalacia or chronic cerebral blood products identified. Vascular: Major intracranial vascular flow voids are preserved. The major dural venous sinuses are enhancing and appear to be patent. Skull and upper cervical spine: Negative visible cervical spine aside from some degeneration. Visualized bone marrow signal is within normal limits. Sinuses/Orbits: Negative. Other: There is a sessile 10 mm skin lesion of the left lateral scalp (series 11, image 38). Also along the left face there is a tiny probable subcutaneous sebaceous cyst with conspicuous diffusion (series 11, image 12). Mastoids are well pneumatized. Grossly normal visible internal auditory structures. IMPRESSION: 1. No metastatic disease or acute intracranial abnormality. Normal for age MRI appearance of the brain. 2. Recommend clinical exam follow-up of a 10 mm left lateral scalp sessile skin lesion (series 11, image 38). Electronically Signed   By: Genevie Ann M.D.   On: 01/13/2020 09:06     ASSESSMENT/PLAN:  This is a very pleasant 69 year old  Caucasian [name] diagnosed with stage IIb (T1a, N1, M0) non-small cell lung cancer, adenocarcinoma.  He was diagnosed in June 2021.  The patient is status post a left upper lobectomy with lymph node dissection on December 16, 2019 under the care of Dr. Kipp Brood. The patient does not have any actionable mutations and his PD-L1 expression is 1%.  He also was diagnosed with a pelvic gist tumor.  The patient is currently undergoing adjuvant systemic chemotherapy with cisplatin 75 mg per metered squared and Alimta 500 mg per metered squared IV every 3 weeks.  He is status post 1 cycle and he tolerated it well.   The patient was seen with Dr. Julien Nordmann, Labs were reviewed.  Recommend that he continue on the same treatment at the same dose.  We will see him back for follow-up visit in 2 weeks for evaluation before starting cycle #2.  We will continue taking magnesium oxide.  The patient was encouraged to continue to drink 64 ounces of water as he has been doing.  He was also encouraged to continue to be active.  For his taste alterations, the patient was encouraged to use salt water rinses and use Biotene.  No evidence of thrush on his exam today.  The patient was advised to call immediately if she has any concerning symptoms in the interval. The patient voices understanding of current disease status and treatment options and is in agreement with the current care plan. All questions were answered. The patient knows to call the clinic with any problems, questions or concerns. We can certainly see the patient much sooner if necessary.   No orders of the defined types were placed in this encounter.    Tenee Wish L Shamond Skelton, PA-C 02/11/20  ADDENDUM: Hematology/Oncology Attending: I had a face-to-face encounter with the patient today.  I recommended his care plan.  The patient is a very pleasant 69 years old white [name] with a stage IIb (T1 a, N1, M0) non-small cell lung cancer, adenocarcinoma with  no actionable mutation and PD-L1 of 1%. The patient is currently undergoing adjuvant systemic chemotherapy with cisplatin and Alimta status post 1 cycle given last week.  He tolerated the first week of his treatment fairly well with no concerning adverse effects.  He denied having any significant nausea, vomiting, diarrhea or constipation.  He denied having any peripheral neuropathy. I recommended for the patient to continue his treatment as planned and he is expected to start cycle #2 in 2 weeks. The patient was  advised to call immediately if he has any concerning symptoms in the interval.  Disclaimer: This note was dictated with voice recognition software. Similar sounding words can inadvertently be transcribed and may be missed upon review. Eilleen Kempf, MD 02/12/20

## 2020-02-10 ENCOUNTER — Telehealth: Payer: Self-pay | Admitting: Medical Oncology

## 2020-02-10 ENCOUNTER — Other Ambulatory Visit: Payer: Self-pay | Admitting: Medical Oncology

## 2020-02-10 MED ORDER — MAGNESIUM OXIDE 400 (241.3 MG) MG PO TABS: 800.0000 mg | ORAL_TABLET | Freq: Two times a day (BID) | ORAL | 1 refills | Status: DC

## 2020-02-10 NOTE — Telephone Encounter (Signed)
called pts pharmacy and cancelled mag 800 mg bid.

## 2020-02-11 ENCOUNTER — Encounter: Payer: Self-pay | Admitting: Physician Assistant

## 2020-02-11 ENCOUNTER — Inpatient Hospital Stay: Payer: Medicare Other

## 2020-02-11 ENCOUNTER — Other Ambulatory Visit: Payer: Self-pay

## 2020-02-11 ENCOUNTER — Inpatient Hospital Stay: Payer: Medicare Other | Admitting: Physician Assistant

## 2020-02-11 VITALS — BP 155/83 | HR 81 | Temp 97.0°F | Resp 18 | Wt 169.5 lb

## 2020-02-11 DIAGNOSIS — C3492 Malignant neoplasm of unspecified part of left bronchus or lung: Secondary | ICD-10-CM

## 2020-02-11 DIAGNOSIS — Z79899 Other long term (current) drug therapy: Secondary | ICD-10-CM | POA: Diagnosis not present

## 2020-02-11 DIAGNOSIS — Z8551 Personal history of malignant neoplasm of bladder: Secondary | ICD-10-CM | POA: Diagnosis not present

## 2020-02-11 DIAGNOSIS — Z902 Acquired absence of lung [part of]: Secondary | ICD-10-CM | POA: Diagnosis not present

## 2020-02-11 DIAGNOSIS — D649 Anemia, unspecified: Secondary | ICD-10-CM | POA: Diagnosis not present

## 2020-02-11 DIAGNOSIS — I10 Essential (primary) hypertension: Secondary | ICD-10-CM | POA: Diagnosis not present

## 2020-02-11 DIAGNOSIS — K219 Gastro-esophageal reflux disease without esophagitis: Secondary | ICD-10-CM | POA: Diagnosis not present

## 2020-02-11 DIAGNOSIS — C3412 Malignant neoplasm of upper lobe, left bronchus or lung: Secondary | ICD-10-CM | POA: Diagnosis not present

## 2020-02-11 DIAGNOSIS — J449 Chronic obstructive pulmonary disease, unspecified: Secondary | ICD-10-CM | POA: Diagnosis not present

## 2020-02-11 DIAGNOSIS — E785 Hyperlipidemia, unspecified: Secondary | ICD-10-CM | POA: Diagnosis not present

## 2020-02-11 DIAGNOSIS — Z5111 Encounter for antineoplastic chemotherapy: Secondary | ICD-10-CM | POA: Diagnosis not present

## 2020-02-11 DIAGNOSIS — Z7952 Long term (current) use of systemic steroids: Secondary | ICD-10-CM | POA: Diagnosis not present

## 2020-02-11 LAB — CMP (CANCER CENTER ONLY)
ALT: 16 U/L (ref 0–44)
AST: 18 U/L (ref 15–41)
Albumin: 3.9 g/dL (ref 3.5–5.0)
Alkaline Phosphatase: 83 U/L (ref 38–126)
Anion gap: 8 (ref 5–15)
BUN: 18 mg/dL (ref 8–23)
CO2: 29 mmol/L (ref 22–32)
Calcium: 9.6 mg/dL (ref 8.9–10.3)
Chloride: 100 mmol/L (ref 98–111)
Creatinine: 0.77 mg/dL (ref 0.61–1.24)
GFR, Estimated: >60 mL/min (ref >60)
Glucose, Bld: 103 mg/dL — ABNORMAL HIGH (ref 70–99)
Potassium: 4.1 mmol/L (ref 3.5–5.1)
Sodium: 137 mmol/L (ref 135–145)
Total Bilirubin: 0.8 mg/dL (ref 0.3–1.2)
Total Protein: 7 g/dL (ref 6.5–8.1)

## 2020-02-11 LAB — MAGNESIUM: Magnesium: 1.8 mg/dL (ref 1.7–2.4)

## 2020-02-11 LAB — CBC WITH DIFFERENTIAL (CANCER CENTER ONLY)
Abs Immature Granulocytes: 0.01 10*3/uL (ref 0.00–0.07)
Basophils Absolute: 0 10*3/uL (ref 0.0–0.1)
Basophils Relative: 0 %
Eosinophils Absolute: 0 10*3/uL (ref 0.0–0.5)
Eosinophils Relative: 1 %
HCT: 37.3 % — ABNORMAL LOW (ref 39.0–52.0)
Hemoglobin: 12.6 g/dL — ABNORMAL LOW (ref 13.0–17.0)
Immature Granulocytes: 0 %
Lymphocytes Relative: 27 %
Lymphs Abs: 1.3 10*3/uL (ref 0.7–4.0)
MCH: 32.1 pg (ref 26.0–34.0)
MCHC: 33.8 g/dL (ref 30.0–36.0)
MCV: 95.2 fL (ref 80.0–100.0)
Monocytes Absolute: 0.2 10*3/uL (ref 0.1–1.0)
Monocytes Relative: 4 %
Neutro Abs: 3.3 10*3/uL (ref 1.7–7.7)
Neutrophils Relative %: 68 %
Platelet Count: 130 10*3/uL — ABNORMAL LOW (ref 150–400)
RBC: 3.92 MIL/uL — ABNORMAL LOW (ref 4.22–5.81)
RDW: 12.4 % (ref 11.5–15.5)
WBC Count: 4.8 10*3/uL (ref 4.0–10.5)
nRBC: 0 % (ref 0.0–0.2)

## 2020-02-11 NOTE — Patient Instructions (Signed)
It is common for patients who are undergoing treatment and taking certain prescribed medications to experience side-effects with constipation.  If you experience constipation, please take stool softener such as Colace or Senna one tablet twice a day everyday to avoid constipation.  These medications are available over the counter.  Of course, if you have diarrhea, stop taking stool softeners.  Drinking plenty of fluid, eating fruits and vegetable, and being active also reduces the risk of constipation.   If despite taking stool softeners, and you still have no bowel movement for 2 days or more than your normal bowel habit frequency, please take one of the following over the counter laxatives:  MiraLax, Milk of Magnesia or Mag Citrate everyday and contact me immediately for further instructions.  The goal is to have at least one bowel movement every other day.   

## 2020-02-12 ENCOUNTER — Other Ambulatory Visit: Payer: Self-pay | Admitting: Physician Assistant

## 2020-02-15 ENCOUNTER — Encounter: Payer: Self-pay | Admitting: Pulmonary Disease

## 2020-02-15 ENCOUNTER — Ambulatory Visit: Payer: Medicare Other | Admitting: Pulmonary Disease

## 2020-02-15 ENCOUNTER — Other Ambulatory Visit: Payer: Self-pay

## 2020-02-15 VITALS — BP 140/74 | HR 78 | Temp 98.0°F | Ht 69.0 in | Wt 170.8 lb

## 2020-02-15 DIAGNOSIS — J432 Centrilobular emphysema: Secondary | ICD-10-CM

## 2020-02-15 DIAGNOSIS — Z902 Acquired absence of lung [part of]: Secondary | ICD-10-CM | POA: Diagnosis not present

## 2020-02-15 DIAGNOSIS — J449 Chronic obstructive pulmonary disease, unspecified: Secondary | ICD-10-CM

## 2020-02-15 DIAGNOSIS — C3492 Malignant neoplasm of unspecified part of left bronchus or lung: Secondary | ICD-10-CM

## 2020-02-15 NOTE — Patient Instructions (Addendum)
Thank you for visiting Dr. Valeta Harms at [facility]. Today we recommend the following:  Orders Placed This Encounter  Procedures  . CT CHEST WO CONTRAST   Follow up CT chest 6 months from surgery date   Return in about 5 months (around 07/15/2020) for Dr. Valeta Harms .    Please do your part to reduce the spread of COVID-19.

## 2020-02-15 NOTE — Progress Notes (Signed)
Synopsis: Referred in 02/15/2020 for history of lung cancer by [name], MD  Subjective:   PATIENT ID: Jesse [name] GENDER: male DOB: [birth year], MRN: [id number]  Chief Complaint  Patient presents with  . Follow-up    Patient started chemo a week and half ago. Patient feeling good overall.    This is a 69 year old gentleman with a past medical history of hyperlipidemia, gastroesophageal reflux bladder cancer. He was diagnosed with a rectal mass. During this work-up he was found to have a left upper lobe 7 mm pulmonary nodule. Patient underwent navigational bronchoscopy for evaluation of this lesion which was determined to be TTF-1 positive adenocarcinoma. Pathology results reviewed. Patient was referred to cardiothoracic surgery for robotic lobectomy. Patient underwent robotic lobectomy on 12/16/2019 by Dr. Kipp Brood. 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 There was involvement of level 10 L 11 L lymph nodes on surgical specimen. Therefore decision was made for adjuvant systemic chemotherapy to include cisplatin plus Alimta. Patient was initially followed in our lung cancer screening program. He is here today for new consultation and establish care with primary pulmonologist. I did his procedure as a direct referral for work-up a few months prior.  02/15/2020: Today, today patient doing very well recently started treatments with chemotherapy for adjuvant therapy.  Overall has no respiratory complaints.  He does feel like his chest has healed up well.  He has not had follow-up CT imaging yet.   Past Medical History:  Diagnosis Date  . Anemia   . Cancer [facility])    bladder cancer  . COPD (chronic obstructive pulmonary disease) (HCC)    mild   . DIVERTICULOSIS, COLON 01/14/2007   Qualifier: Diagnosis of  By: Paulina Fusi RN, [name]   . GASTRITIS, CHRONIC 06/01/2004   Qualifier: Diagnosis of  By:  Nolon Rod CMA (AAMA), Robin    . GERD 01/09/2007   Qualifier: Diagnosis of  By: Scherrie Gerlach    . Heart murmur    as a child; no murmur heard 12/14/19  . HYPERLIPIDEMIA 01/09/2007   Qualifier: Diagnosis of  By: Scherrie Gerlach    . Hypertension   . Lung mass    left upper nodule  . Macular degeneration    right eye  . NEOPLASM, MALIGNANT, BLADDER, HX OF 2002   Qualifier: Diagnosis of  By: Nolon Rod CMA (AAMA), Robin  / no chemo or radiation  . OSTEOARTHRITIS 01/14/2007   Qualifier: Diagnosis of  By: Paulina Fusi RN, [name] - knees  . Rectal mass   . Reflux esophagitis 06/01/2004   Qualifier: Diagnosis of  By: Nolon Rod CMA (AAMA), Robin    . TOBACCO USER 02/16/2009   Qualifier: Diagnosis of  By: Burnice Logan  MD, Doretha Sou   . VITAMIN D DEFICIENCY 06/03/2007   Qualifier: Diagnosis of  By: Burnice Logan  MD, Doretha Sou      Family History  Problem Relation Age of Onset  . Dementia Mother   . Diabetes Father   . Mental illness Father   . Peripheral vascular disease Brother      Past Surgical History:  Procedure Laterality Date  . BRONCHIAL BIOPSY  10/13/2019   Procedure: BRONCHIAL BIOPSIES;  Surgeon: Garner Nash, DO;  Location: [place];  Service: Pulmonary;;  . BRONCHIAL BRUSHINGS  10/13/2019   Procedure: BRONCHIAL BRUSHINGS;  Surgeon: Garner Nash, DO;  Location: [place];  Service: Pulmonary;;  . BRONCHIAL NEEDLE ASPIRATION  BIOPSY  10/13/2019   Procedure: BRONCHIAL NEEDLE ASPIRATION BIOPSIES;  Surgeon: Garner Nash, DO;  Location: [place] [facility];  Service: Pulmonary;;  . BRONCHIAL WASHINGS  10/13/2019   Procedure: BRONCHIAL WASHINGS;  Surgeon: Garner Nash, DO;  Location: [place] [facility];  Service: Pulmonary;;  . COLONOSCOPY     multiple  . INTERCOSTAL NERVE BLOCK Left 12/16/2019   Procedure: INTERCOSTAL NERVE BLOCK;  Surgeon: Lajuana Matte, MD;  Location: [place];  Service: Thoracic;  Laterality: Left;  . KNEE SURGERY  07/20/2008   bilat.  Marland Kitchen NASAL SEPTUM SURGERY   1995  . NODE DISSECTION Left 12/16/2019   Procedure: NODE DISSECTION;  Surgeon: Lajuana Matte, MD;  Location: [place];  Service: Thoracic;  Laterality: Left;  . TONSILLECTOMY    . TRANSURETHRAL RESECTION OF BLADDER TUMOR  2002  . UPPER GASTROINTESTINAL ENDOSCOPY    . VIDEO BRONCHOSCOPY WITH ENDOBRONCHIAL NAVIGATION N/A 10/13/2019   Procedure: VIDEO BRONCHOSCOPY WITH ENDOBRONCHIAL NAVIGATION;  Surgeon: Garner Nash, DO;  Location: [place];  Service: Pulmonary;  Laterality: N/A;  . WISDOM TOOTH EXTRACTION      Social History   Socioeconomic History  . Marital status: Married    Spouse name: Not on file  . Number of children: 3  . Years of education: Not on file  . Highest education level: Not on file  Occupational History  . Not on file  Tobacco Use  . Smoking status: Former Smoker    Packs/day: 0.50    Years: 40.00    Pack years: 20.00    Types: Cigarettes    Quit date: 09/21/2019    Years since quitting: 0.4  . Smokeless tobacco: Never Used  Vaping Use  . Vaping Use: Never used  Substance and Sexual Activity  . Alcohol use: Yes    Alcohol/week: 15.0 standard drinks    Types: 15 Standard drinks or equivalent per week    Comment: wine/beer/martini  . Drug use: Yes    Types: Marijuana    Comment: Smokes marijuana occasional, last use in 08/2019  . Sexual activity: Not on file  Other Topics Concern  . 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 Difficulty of Paying Living Expenses: Not hard at all  Food Insecurity: No Food Insecurity  . Worried About Charity fundraiser in the Last Year: Never true  . Ran Out of Food in the Last Year: Never true  Transportation Needs: No Transportation Needs  . Lack of Transportation (Medical): No  . Lack of  Transportation (Non-Medical): No  Physical Activity: Sufficiently Active  . Days of Exercise per Week: 7 days  . Minutes of Exercise per Session: 60 min  Stress: No Stress Concern Present  . Feeling of Stress : Only a little  Social Connections: Unknown  . Frequency of Communication with Friends and Family: More than three times a week  . Frequency of Social Gatherings with Friends and Family: Twice a week  . Attends Religious Services: Not on file  . Active Member of Clubs or Organizations: Yes  . Attends Archivist Meetings: More than 4 times per year  . Marital Status: Married  Human resources officer Violence:   . Fear of Current or Ex-Partner: Not on file  .  Emotionally Abused: Not on file  . Physically Abused: Not on file  . Sexually Abused: Not on file     No Known Allergies   Outpatient Medications Prior to Visit  Medication Sig Dispense Refill  . amLODipine (NORVASC) 10 MG tablet Take 1 tablet (10 mg total) by mouth daily. (Patient taking differently: Take 5 mg by mouth daily. ) 30 tablet 1  . atorvastatin (LIPITOR) 40 MG tablet Take 40 mg by mouth daily.    Marland Kitchen dexamethasone (DECADRON) 4 MG tablet 1 tablet p.o. twice daily the day before, day of and day after chemotherapy every 3 weeks. 30 tablet 0  . diphenhydrAMINE (BENADRYL) 25 mg capsule Take 1-2 capsules (25-50 mg total) by mouth at bedtime as needed for sleep. 30 capsule 0  . folic acid (FOLVITE) 1 MG tablet Take 1 tablet (1 mg total) by mouth daily. 30 tablet 4  . magnesium oxide (MAG-OX) 400 MG tablet TAKE 1 TABLET BY MOUTH TWICE A DAY 60 tablet 2  . omeprazole (PRILOSEC) 40 MG capsule Take 1 capsule (40 mg total) by mouth daily. (Patient taking differently: Take 40 mg by mouth daily before breakfast. ) 90 capsule 3  . prochlorperazine (COMPAZINE) 10 MG tablet Take 1 tablet (10 mg total) by mouth every 6 (six) hours as needed for nausea or vomiting. 30 tablet 0   No facility-administered medications prior to visit.     Review of Systems  Constitutional: Negative for chills, fever, malaise/fatigue and weight loss.  HENT: Negative for hearing loss, sore throat and tinnitus.   Eyes: Negative for blurred vision and double vision.  Respiratory: Negative for cough, hemoptysis, sputum production, shortness of breath, wheezing and stridor.   Cardiovascular: Negative for chest pain, palpitations, orthopnea, leg swelling and PND.  Gastrointestinal: Negative for abdominal pain, constipation, diarrhea, heartburn, nausea and vomiting.  Genitourinary: Negative for dysuria, hematuria and urgency.  Musculoskeletal: Negative for joint pain and myalgias.  Skin: Negative for itching and rash.  Neurological: Negative for dizziness, tingling, weakness and headaches.  Endo/Heme/Allergies: Negative for environmental allergies. Does not bruise/bleed easily.  Psychiatric/Behavioral: Negative for depression. The patient is not nervous/anxious and does not have insomnia.   All other systems reviewed and are negative.    Objective:  Physical Exam Vitals reviewed.  Constitutional:      General: He is not in acute distress.    Appearance: He is well-developed.  HENT:     Head: Normocephalic and atraumatic.  Eyes:     General: No scleral icterus.    Conjunctiva/sclera: Conjunctivae normal.     Pupils: Pupils are equal, round, and reactive to light.  Neck:     Vascular: No JVD.     Trachea: No tracheal deviation.  Cardiovascular:     Rate and Rhythm: Normal rate and regular rhythm.     Heart sounds: Normal heart sounds. No murmur heard.   Pulmonary:     Effort: Pulmonary effort is normal. No tachypnea, accessory muscle usage or respiratory distress.     Breath sounds: Normal breath sounds. No stridor. No wheezing, rhonchi or rales.     Comments: Chest with well-healed incisions. Abdominal:     General: Bowel sounds are normal. There is no distension.     Palpations: Abdomen is soft.     Tenderness: There is no  abdominal tenderness.  Musculoskeletal:        General: No tenderness.     Cervical back: Neck supple.  Lymphadenopathy:     Cervical: No cervical  adenopathy.  Skin:    General: Skin is warm and dry.     Capillary Refill: Capillary refill takes less than 2 seconds.     Findings: No rash.  Neurological:     Mental Status: He is alert and oriented to person, place, and time.  Psychiatric:        Behavior: Behavior normal.      Vitals:   02/15/20 1001  BP: 140/74  Pulse: 78  Temp: 98 F (36.7 C)  TempSrc: Temporal  SpO2: 98%  Weight: 170 lb 12.8 oz (77.5 kg)  Height: 5\' 9"  (1.753 m)   98% on RA BMI Readings from Last 3 Encounters:  02/15/20 25.22 kg/m  02/11/20 25.03 kg/m  01/29/20 24.81 kg/m   Wt Readings from Last 3 Encounters:  02/15/20 170 lb 12.8 oz (77.5 kg)  02/11/20 169 lb 8 oz (76.9 kg)  01/29/20 168 lb (76.2 kg)     CBC    Component Value Date/Time   WBC 4.8 02/11/2020 0950   WBC 6.6 12/18/2019 0037   RBC 3.92 (L) 02/11/2020 0950   HGB 12.6 (L) 02/11/2020 0950   HCT 37.3 (L) 02/11/2020 0950   PLT 130 (L) 02/11/2020 0950   MCV 95.2 02/11/2020 0950   MCH 32.1 02/11/2020 0950   MCHC 33.8 02/11/2020 0950   RDW 12.4 02/11/2020 0950   LYMPHSABS 1.3 02/11/2020 0950   MONOABS 0.2 02/11/2020 0950   EOSABS 0.0 02/11/2020 0950   BASOSABS 0.0 02/11/2020 0950    Chest Imaging: 01/29/2020 chest x-ray: No pneumothorax, postoperative changes from lobectomy. The patient's images have been independently reviewed by me.    Pulmonary Functions Testing Results: PFT Results Latest Ref Rng & Units 09/25/2019  FVC-Pre L 4.84  FVC-Predicted Pre % 113  FVC-Post L 4.92  FVC-Predicted Post % 115  Pre FEV1/FVC % % 64  Post FEV1/FCV % % 68  FEV1-Pre L 3.10  FEV1-Predicted Pre % 99  FEV1-Post L 3.33  DLCO uncorrected ml/min/mmHg 27.02  DLCO UNC% % 107  DLVA Predicted % 95  TLC L 7.12  TLC % Predicted % 104  RV % Predicted % 100    FeNO:   Pathology:    Navigational bronchoscopy pathology: Positive TTF-1 adenocarcinoma Robotic lobectomy, left upper lobe, 0.9 cm adenocarcinoma, level 10 L, 11 L lymph node involvement Pathology results reviewed  Echocardiogram:   Heart Catheterization:     Assessment & Plan:     ICD-10-CM   1. Adenocarcinoma of left lung, stage 2 (HCC)  C34.92 CT CHEST WO CONTRAST    Pulmonary Function Test  2. Centrilobular emphysema ([place])  J43.2   3. S/P LUL lobectomy of lung  Z90.2 Pulmonary Function Test   Dr. Kipp Brood 12/16/2019  4. Stage 1 mild COPD by GOLD classification ([place])  J44.9     Discussion: This is a 69 year old gentleman with stage II adenocarcinoma of the left lung status post left upper lobe lobectomy by Dr. Kipp Brood.  Mild COPD at baseline.  Overall doing well since his surgery.  He did have positive lymph nodes and currently undergoing adjuvant chemotherapy.  Before and after office visit time was dedicated to review of thoracic surgery documentation, operative procedure, pathology results as well as medical oncology consultation and recent office visits by Drs. Lightfoot and Mohammed.  Plan: He needs a repeat noncontrasted CT of the chest for follow-up post surgery at 6 months.  This has been ordered. We will also get pulmonary function test completed at that time. He  has follow-up scheduled with medical oncology.    Current Outpatient Medications:  .  amLODipine (NORVASC) 10 MG tablet, Take 1 tablet (10 mg total) by mouth daily. (Patient taking differently: Take 5 mg by mouth daily. ), Disp: 30 tablet, Rfl: 1 .  atorvastatin (LIPITOR) 40 MG tablet, Take 40 mg by mouth daily., Disp: , Rfl:  .  dexamethasone (DECADRON) 4 MG tablet, 1 tablet p.o. twice daily the day before, day of and day after chemotherapy every 3 weeks., Disp: 30 tablet, Rfl: 0 .  diphenhydrAMINE (BENADRYL) 25 mg capsule, Take 1-2 capsules (25-50 mg total) by mouth at bedtime as needed for sleep., Disp: 30 capsule, Rfl:  0 .  folic acid (FOLVITE) 1 MG tablet, Take 1 tablet (1 mg total) by mouth daily., Disp: 30 tablet, Rfl: 4 .  magnesium oxide (MAG-OX) 400 MG tablet, TAKE 1 TABLET BY MOUTH TWICE A DAY, Disp: 60 tablet, Rfl: 2 .  omeprazole (PRILOSEC) 40 MG capsule, Take 1 capsule (40 mg total) by mouth daily. (Patient taking differently: Take 40 mg by mouth daily before breakfast. ), Disp: 90 capsule, Rfl: 3 .  prochlorperazine (COMPAZINE) 10 MG tablet, Take 1 tablet (10 mg total) by mouth every 6 (six) hours as needed for nausea or vomiting., Disp: 30 tablet, Rfl: 0  I spent 60 minutes dedicated to the care of this patient on the date of this encounter to include pre-visit review of records, face-to-face time with the patient discussing conditions above, post visit ordering of testing, clinical documentation with the electronic health record, making appropriate referrals as documented, and communicating necessary findings to members of the patients care team.   Garner Nash, DO [facility] 02/15/2020 10:16 AM

## 2020-02-17 ENCOUNTER — Encounter: Payer: Self-pay | Admitting: Internal Medicine

## 2020-02-17 NOTE — Progress Notes (Signed)
Called pt to introduce myself as his Arboriculturist.  Unfortunately there aren't any foundations offering copay assistance for his Dx and the type of ins he has.  I informed him of the [employer] and went over what it covers but he declined it at this time because he makes a pretty good living and wants to leave it for someone in greater need.  I requested the registration staff give him my card in case he changes his mind and for any questions or concerns he may have in the future.

## 2020-02-18 ENCOUNTER — Inpatient Hospital Stay: Payer: Medicare Other | Attending: Internal Medicine

## 2020-02-18 ENCOUNTER — Other Ambulatory Visit: Payer: Self-pay

## 2020-02-18 DIAGNOSIS — I10 Essential (primary) hypertension: Secondary | ICD-10-CM | POA: Insufficient documentation

## 2020-02-18 DIAGNOSIS — Z5111 Encounter for antineoplastic chemotherapy: Secondary | ICD-10-CM | POA: Diagnosis not present

## 2020-02-18 DIAGNOSIS — Z902 Acquired absence of lung [part of]: Secondary | ICD-10-CM | POA: Insufficient documentation

## 2020-02-18 DIAGNOSIS — C3412 Malignant neoplasm of upper lobe, left bronchus or lung: Secondary | ICD-10-CM | POA: Diagnosis not present

## 2020-02-18 DIAGNOSIS — Z23 Encounter for immunization: Secondary | ICD-10-CM | POA: Insufficient documentation

## 2020-02-18 DIAGNOSIS — Z8551 Personal history of malignant neoplasm of bladder: Secondary | ICD-10-CM | POA: Insufficient documentation

## 2020-02-18 DIAGNOSIS — E559 Vitamin D deficiency, unspecified: Secondary | ICD-10-CM | POA: Insufficient documentation

## 2020-02-18 DIAGNOSIS — Z79899 Other long term (current) drug therapy: Secondary | ICD-10-CM | POA: Diagnosis not present

## 2020-02-18 DIAGNOSIS — C3492 Malignant neoplasm of unspecified part of left bronchus or lung: Secondary | ICD-10-CM

## 2020-02-18 DIAGNOSIS — E785 Hyperlipidemia, unspecified: Secondary | ICD-10-CM | POA: Diagnosis not present

## 2020-02-18 DIAGNOSIS — C49A9 Gastrointestinal stromal tumor of other sites: Secondary | ICD-10-CM | POA: Insufficient documentation

## 2020-02-18 DIAGNOSIS — D649 Anemia, unspecified: Secondary | ICD-10-CM | POA: Diagnosis not present

## 2020-02-18 LAB — CBC WITH DIFFERENTIAL (CANCER CENTER ONLY)
Abs Immature Granulocytes: 0.01 10*3/uL (ref 0.00–0.07)
Basophils Absolute: 0 10*3/uL (ref 0.0–0.1)
Basophils Relative: 0 %
Eosinophils Absolute: 0.1 10*3/uL (ref 0.0–0.5)
Eosinophils Relative: 2 %
HCT: 36.8 % — ABNORMAL LOW (ref 39.0–52.0)
Hemoglobin: 12.3 g/dL — ABNORMAL LOW (ref 13.0–17.0)
Immature Granulocytes: 0 %
Lymphocytes Relative: 31 %
Lymphs Abs: 1.5 10*3/uL (ref 0.7–4.0)
MCH: 31.9 pg (ref 26.0–34.0)
MCHC: 33.4 g/dL (ref 30.0–36.0)
MCV: 95.3 fL (ref 80.0–100.0)
Monocytes Absolute: 0.3 10*3/uL (ref 0.1–1.0)
Monocytes Relative: 7 %
Neutro Abs: 2.8 10*3/uL (ref 1.7–7.7)
Neutrophils Relative %: 60 %
Platelet Count: 133 10*3/uL — ABNORMAL LOW (ref 150–400)
RBC: 3.86 MIL/uL — ABNORMAL LOW (ref 4.22–5.81)
RDW: 12.8 % (ref 11.5–15.5)
WBC Count: 4.7 10*3/uL (ref 4.0–10.5)
nRBC: 0 % (ref 0.0–0.2)

## 2020-02-18 LAB — CMP (CANCER CENTER ONLY)
ALT: 26 U/L (ref 0–44)
AST: 23 U/L (ref 15–41)
Albumin: 3.7 g/dL (ref 3.5–5.0)
Alkaline Phosphatase: 105 U/L (ref 38–126)
Anion gap: 8 (ref 5–15)
BUN: 22 mg/dL (ref 8–23)
CO2: 27 mmol/L (ref 22–32)
Calcium: 9.5 mg/dL (ref 8.9–10.3)
Chloride: 104 mmol/L (ref 98–111)
Creatinine: 0.71 mg/dL (ref 0.61–1.24)
GFR, Estimated: >60 mL/min (ref >60)
Glucose, Bld: 104 mg/dL — ABNORMAL HIGH (ref 70–99)
Potassium: 4.1 mmol/L (ref 3.5–5.1)
Sodium: 139 mmol/L (ref 135–145)
Total Bilirubin: 0.5 mg/dL (ref 0.3–1.2)
Total Protein: 6.9 g/dL (ref 6.5–8.1)

## 2020-02-18 LAB — MAGNESIUM: Magnesium: 1.6 mg/dL — ABNORMAL LOW (ref 1.7–2.4)

## 2020-02-24 ENCOUNTER — Telehealth: Payer: Self-pay | Admitting: Medical Oncology

## 2020-02-24 NOTE — Telephone Encounter (Signed)
DCP-001, Use of a Clinical Trial Screening Tool to Address Cancer Health Disparities in the [place]) [place] phone call: 4:52 PM I spoke with patient regarding study. Patient was provided with a brief description about the study and understands that this is a one time consent and collection of demographic variables with the majority of the data collected from his medical record. Patient is in clinic tomorrow for a scheduled appointment and I inquired if I could meet with him for a few minutes to discuss study and see if this would be something he would be interested in participating. Patient agreed and we made plans to meet during his scheduled appointment tomorrow. Patient thanked for his time.  Maxwell Marion, RN, BSN, [facility] Clinical Research 02/24/2020 4:58 PM

## 2020-02-25 ENCOUNTER — Encounter: Payer: Self-pay | Admitting: Internal Medicine

## 2020-02-25 ENCOUNTER — Encounter: Payer: Self-pay | Admitting: Medical Oncology

## 2020-02-25 ENCOUNTER — Other Ambulatory Visit: Payer: Self-pay

## 2020-02-25 ENCOUNTER — Inpatient Hospital Stay: Payer: Medicare Other

## 2020-02-25 ENCOUNTER — Inpatient Hospital Stay: Payer: Medicare Other | Admitting: Internal Medicine

## 2020-02-25 VITALS — BP 145/76 | HR 82 | Temp 97.1°F | Resp 16 | Ht 69.0 in | Wt 170.1 lb

## 2020-02-25 DIAGNOSIS — C49A9 Gastrointestinal stromal tumor of other sites: Secondary | ICD-10-CM | POA: Diagnosis not present

## 2020-02-25 DIAGNOSIS — C3491 Malignant neoplasm of unspecified part of right bronchus or lung: Secondary | ICD-10-CM | POA: Diagnosis not present

## 2020-02-25 DIAGNOSIS — Z79899 Other long term (current) drug therapy: Secondary | ICD-10-CM | POA: Diagnosis not present

## 2020-02-25 DIAGNOSIS — I10 Essential (primary) hypertension: Secondary | ICD-10-CM

## 2020-02-25 DIAGNOSIS — C3492 Malignant neoplasm of unspecified part of left bronchus or lung: Secondary | ICD-10-CM

## 2020-02-25 DIAGNOSIS — Z8551 Personal history of malignant neoplasm of bladder: Secondary | ICD-10-CM | POA: Diagnosis not present

## 2020-02-25 DIAGNOSIS — D649 Anemia, unspecified: Secondary | ICD-10-CM | POA: Diagnosis not present

## 2020-02-25 DIAGNOSIS — Z5111 Encounter for antineoplastic chemotherapy: Secondary | ICD-10-CM

## 2020-02-25 DIAGNOSIS — E559 Vitamin D deficiency, unspecified: Secondary | ICD-10-CM | POA: Diagnosis not present

## 2020-02-25 DIAGNOSIS — E785 Hyperlipidemia, unspecified: Secondary | ICD-10-CM | POA: Diagnosis not present

## 2020-02-25 DIAGNOSIS — C49A5 Gastrointestinal stromal tumor of rectum: Secondary | ICD-10-CM | POA: Diagnosis not present

## 2020-02-25 DIAGNOSIS — Z902 Acquired absence of lung [part of]: Secondary | ICD-10-CM | POA: Diagnosis not present

## 2020-02-25 DIAGNOSIS — C3412 Malignant neoplasm of upper lobe, left bronchus or lung: Secondary | ICD-10-CM | POA: Diagnosis not present

## 2020-02-25 DIAGNOSIS — C49A0 Gastrointestinal stromal tumor, unspecified site: Secondary | ICD-10-CM | POA: Insufficient documentation

## 2020-02-25 DIAGNOSIS — Z23 Encounter for immunization: Secondary | ICD-10-CM

## 2020-02-25 LAB — CMP (CANCER CENTER ONLY)
ALT: 15 U/L (ref 0–44)
AST: 16 U/L (ref 15–41)
Albumin: 3.9 g/dL (ref 3.5–5.0)
Alkaline Phosphatase: 101 U/L (ref 38–126)
Anion gap: 11 (ref 5–15)
BUN: 24 mg/dL — ABNORMAL HIGH (ref 8–23)
CO2: 23 mmol/L (ref 22–32)
Calcium: 9.9 mg/dL (ref 8.9–10.3)
Chloride: 104 mmol/L (ref 98–111)
Creatinine: 0.87 mg/dL (ref 0.61–1.24)
GFR, Estimated: >60 mL/min (ref >60)
Glucose, Bld: 153 mg/dL — ABNORMAL HIGH (ref 70–99)
Potassium: 4.1 mmol/L (ref 3.5–5.1)
Sodium: 138 mmol/L (ref 135–145)
Total Bilirubin: 0.5 mg/dL (ref 0.3–1.2)
Total Protein: 7.2 g/dL (ref 6.5–8.1)

## 2020-02-25 LAB — CBC WITH DIFFERENTIAL (CANCER CENTER ONLY)
Abs Immature Granulocytes: 0.01 10*3/uL (ref 0.00–0.07)
Basophils Absolute: 0 10*3/uL (ref 0.0–0.1)
Basophils Relative: 0 %
Eosinophils Absolute: 0 10*3/uL (ref 0.0–0.5)
Eosinophils Relative: 0 %
HCT: 36.8 % — ABNORMAL LOW (ref 39.0–52.0)
Hemoglobin: 12.7 g/dL — ABNORMAL LOW (ref 13.0–17.0)
Immature Granulocytes: 0 %
Lymphocytes Relative: 11 %
Lymphs Abs: 0.6 10*3/uL — ABNORMAL LOW (ref 0.7–4.0)
MCH: 32.5 pg (ref 26.0–34.0)
MCHC: 34.5 g/dL (ref 30.0–36.0)
MCV: 94.1 fL (ref 80.0–100.0)
Monocytes Absolute: 0.2 10*3/uL (ref 0.1–1.0)
Monocytes Relative: 4 %
Neutro Abs: 4.3 10*3/uL (ref 1.7–7.7)
Neutrophils Relative %: 85 %
Platelet Count: 216 10*3/uL (ref 150–400)
RBC: 3.91 MIL/uL — ABNORMAL LOW (ref 4.22–5.81)
RDW: 13.2 % (ref 11.5–15.5)
WBC Count: 5.1 10*3/uL (ref 4.0–10.5)
nRBC: 0 % (ref 0.0–0.2)

## 2020-02-25 LAB — MAGNESIUM: Magnesium: 1.7 mg/dL (ref 1.7–2.4)

## 2020-02-25 MED ORDER — SODIUM CHLORIDE 0.9 % IV SOLN
75.0000 mg/m2 | Freq: Once | INTRAVENOUS | Status: AC
  Administered 2020-02-25: 145 mg via INTRAVENOUS
  Filled 2020-02-25: qty 145

## 2020-02-25 MED ORDER — SODIUM CHLORIDE 0.9 % IV SOLN
500.0000 mg/m2 | Freq: Once | INTRAVENOUS | Status: AC
  Administered 2020-02-25: 1000 mg via INTRAVENOUS
  Filled 2020-02-25: qty 40

## 2020-02-25 MED ORDER — SODIUM CHLORIDE 0.9 % IV SOLN
Freq: Once | INTRAVENOUS | Status: AC
  Filled 2020-02-25: qty 10

## 2020-02-25 MED ORDER — SODIUM CHLORIDE 0.9 % IV SOLN
10.0000 mg | Freq: Once | INTRAVENOUS | Status: AC
  Administered 2020-02-25: 10 mg via INTRAVENOUS
  Filled 2020-02-25: qty 10

## 2020-02-25 MED ORDER — INFLUENZA VAC A&B SA ADJ QUAD 0.5 ML IM PRSY
PREFILLED_SYRINGE | INTRAMUSCULAR | Status: AC
  Filled 2020-02-25: qty 0.5

## 2020-02-25 MED ORDER — SODIUM CHLORIDE 0.9 % IV SOLN
Freq: Once | INTRAVENOUS | Status: AC
  Filled 2020-02-25: qty 250

## 2020-02-25 MED ORDER — PALONOSETRON HCL INJECTION 0.25 MG/5ML
INTRAVENOUS | Status: AC
  Filled 2020-02-25: qty 5

## 2020-02-25 MED ORDER — SODIUM CHLORIDE 0.9 % IV SOLN
150.0000 mg | Freq: Once | INTRAVENOUS | Status: AC
  Administered 2020-02-25: 150 mg via INTRAVENOUS
  Filled 2020-02-25: qty 150

## 2020-02-25 MED ORDER — INFLUENZA VAC A&B SA ADJ QUAD 0.5 ML IM PRSY
0.5000 mL | PREFILLED_SYRINGE | Freq: Once | INTRAMUSCULAR | Status: AC
  Administered 2020-02-25: 0.5 mL via INTRAMUSCULAR

## 2020-02-25 MED ORDER — PALONOSETRON HCL INJECTION 0.25 MG/5ML
0.2500 mg | Freq: Once | INTRAVENOUS | Status: AC
  Administered 2020-02-25: 0.25 mg via INTRAVENOUS

## 2020-02-25 NOTE — Progress Notes (Signed)
[facility] [phone]   [phone]  OFFICE PROGRESS NOTE  [name], MD [place] [address]  DIAGNOSIS:  1) Stage IIB (T1 a, N1, M0) non-small cell lung cancer, adenocarcinoma diagnosed in June 2021. 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  Biomarker Findings Tumor Mutational Burden - 20 Muts/Mb Microsatellite status - MS-Stable Genomic Findings For a complete list of the genes assayed, please refer to the Appendix. KEAP1 I457f*17 [id number] RB1 splice site [id number]>[id number]* 8 Disease relevant genes with no reportable alterations: ALK, BRAF, EGFR, ERBB2, KRAS, MET, RET, ROS1  PDL1 Expression: 1 %  PRIOR THERAPY: status post left upper lobectomy with lymph node dissection on December 16, 2019 under the care of Dr. LKipp Brood  The tumor size measured 0.9 cm but there was involvement of the level 10 L and 11 L.  CURRENT THERAPY: Adjuvant systemic chemotherapy with cisplatin 75 mg/M2 and Alimta 500 mg/M2 every 3 weeks.  First dose February 04, 2020.  Status post 1 cycle.  INTERVAL HISTORY: Jesse MELHORN69y.o. male returns to the clinic today for follow-up visit.  The patient is feeling fine today with no concerning complaints.  The patient tolerated the first cycle of his treatment well with no concerning adverse effects. He denied having any chest pain, shortness of breath, cough or hemoptysis.  He denied having any fever or chills.  He has no nausea, vomiting, diarrhea or constipation.  He has no headache or visual changes.  He is here today for evaluation before starting cycle #2.  He was referred by his urologist to the general surgery for evaluation of the GIST tumor .  MEDICAL HISTORY: Past Medical History:  Diagnosis Date  . Anemia   . Cancer ([facility]    bladder cancer  . COPD (chronic obstructive pulmonary disease) (HCC)    mild   . DIVERTICULOSIS, COLON 01/14/2007   Qualifier:  Diagnosis of  By: MPaulina FusiRN, JDaine Gravel  . GASTRITIS, CHRONIC 06/01/2004   Qualifier: Diagnosis of  By: SNolon RodCMA (AAMA), Robin    . GERD 01/09/2007   Qualifier: Diagnosis of  By: TScherrie Gerlach   . Heart murmur    as a child; no murmur heard 12/14/19  . HYPERLIPIDEMIA 01/09/2007   Qualifier: Diagnosis of  By: TScherrie Gerlach   . Hypertension   . Lung mass    left upper nodule  . Macular degeneration    right eye  . NEOPLASM, MALIGNANT, BLADDER, HX OF 2002   Qualifier: Diagnosis of  By: SNolon RodCMA (AAMA), Robin  / no chemo or radiation  . OSTEOARTHRITIS 01/14/2007   Qualifier: Diagnosis of  By: MPaulina FusiRN, JDaine Gravel- knees  . Rectal mass   . Reflux esophagitis 06/01/2004   Qualifier: Diagnosis of  By: SNolon RodCMA (AAMA), Robin    . TOBACCO USER 02/16/2009   Qualifier: Diagnosis of  By: KBurnice Logan MD, PDoretha Sou  . VITAMIN D DEFICIENCY 06/03/2007   Qualifier: Diagnosis of  By: KBurnice Logan MD, PDoretha Sou    ALLERGIES:  has No Known Allergies.  MEDICATIONS:  Current Outpatient Medications  Medication Sig Dispense Refill  . atorvastatin (LIPITOR) 40 MG tablet Take 40 mg by mouth daily.    .Marland KitchenamLODipine (NORVASC) 10 MG tablet Take 1 tablet (10 mg total) by mouth daily. (Patient taking differently: Take 5 mg by mouth daily. ) 30 tablet 1  .  dexamethasone (DECADRON) 4 MG tablet 1 tablet p.o. twice daily the day before, day of and day after chemotherapy every 3 weeks. 30 tablet 0  . diphenhydrAMINE (BENADRYL) 25 mg capsule Take 1-2 capsules (25-50 mg total) by mouth at bedtime as needed for sleep. 30 capsule 0  . folic acid (FOLVITE) 1 MG tablet Take 1 tablet (1 mg total) by mouth daily. 30 tablet 4  . magnesium oxide (MAG-OX) 400 MG tablet TAKE 1 TABLET BY MOUTH TWICE A DAY 60 tablet 2  . omeprazole (PRILOSEC) 40 MG capsule Take 1 capsule (40 mg total) by mouth daily. (Patient taking differently: Take 40 mg by mouth daily before breakfast. ) 90 capsule 3  . prochlorperazine  (COMPAZINE) 10 MG tablet Take 1 tablet (10 mg total) by mouth every 6 (six) hours as needed for nausea or vomiting. 30 tablet 0   No current facility-administered medications for this visit.    SURGICAL HISTORY:  Past Surgical History:  Procedure Laterality Date  . BRONCHIAL BIOPSY  10/13/2019   Procedure: BRONCHIAL BIOPSIES;  Surgeon: Garner Nash, DO;  Location: [facility];  Service: Pulmonary;;  . BRONCHIAL BRUSHINGS  10/13/2019   Procedure: BRONCHIAL BRUSHINGS;  Surgeon: Garner Nash, DO;  Location: [facility];  Service: Pulmonary;;  . BRONCHIAL NEEDLE ASPIRATION BIOPSY  10/13/2019   Procedure: BRONCHIAL NEEDLE ASPIRATION BIOPSIES;  Surgeon: Garner Nash, DO;  Location: [facility];  Service: Pulmonary;;  . BRONCHIAL WASHINGS  10/13/2019   Procedure: BRONCHIAL WASHINGS;  Surgeon: Garner Nash, DO;  Location: [facility];  Service: Pulmonary;;  . COLONOSCOPY     multiple  . INTERCOSTAL NERVE BLOCK Left 12/16/2019   Procedure: INTERCOSTAL NERVE BLOCK;  Surgeon: Lajuana Matte, MD;  Location: [facility];  Service: Thoracic;  Laterality: Left;  . KNEE SURGERY  07/20/2008   bilat.  Marland Kitchen NASAL SEPTUM SURGERY  1995  . NODE DISSECTION Left 12/16/2019   Procedure: NODE DISSECTION;  Surgeon: Lajuana Matte, MD;  Location: [facility];  Service: Thoracic;  Laterality: Left;  . TONSILLECTOMY    . TRANSURETHRAL RESECTION OF BLADDER TUMOR  2002  . UPPER GASTROINTESTINAL ENDOSCOPY    . VIDEO BRONCHOSCOPY WITH ENDOBRONCHIAL NAVIGATION N/A 10/13/2019   Procedure: VIDEO BRONCHOSCOPY WITH ENDOBRONCHIAL NAVIGATION;  Surgeon: Garner Nash, DO;  Location: [facility];  Service: Pulmonary;  Laterality: N/A;  . WISDOM TOOTH EXTRACTION      REVIEW OF SYSTEMS:  A comprehensive review of systems was negative.   PHYSICAL EXAMINATION: General appearance: alert, cooperative and no distress Head: Normocephalic, without obvious abnormality, atraumatic Neck: no adenopathy, no JVD, supple,  symmetrical, trachea midline and thyroid not enlarged, symmetric, no tenderness/mass/nodules Lymph nodes: Cervical, supraclavicular, and axillary nodes normal. Resp: clear to auscultation bilaterally Back: symmetric, no curvature. ROM normal. No CVA tenderness. Cardio: regular rate and rhythm, S1, S2 normal, no murmur, click, rub or gallop GI: soft, non-tender; bowel sounds normal; no masses,  no organomegaly Extremities: extremities normal, atraumatic, no cyanosis or edema  ECOG PERFORMANCE STATUS: 1 - Symptomatic but completely ambulatory  Blood pressure (!) 145/76, pulse 82, temperature (!) 97.1 F (36.2 C), temperature source Tympanic, resp. rate 16, height '5\' 9"'  (1.753 m), weight 170 lb 1.6 oz (77.2 kg), SpO2 98 %.  LABORATORY DATA: Lab Results  Component Value Date   WBC 5.1 02/25/2020   HGB 12.7 (L) 02/25/2020   HCT 36.8 (L) 02/25/2020   MCV 94.1 02/25/2020   PLT 216 02/25/2020      Chemistry  Component Value Date/Time   NA 138 02/25/2020 0756   K 4.1 02/25/2020 0756   CL 104 02/25/2020 0756   CO2 23 02/25/2020 0756   BUN 24 (H) 02/25/2020 0756   CREATININE 0.87 02/25/2020 0756      Component Value Date/Time   CALCIUM 9.9 02/25/2020 0756   ALKPHOS 101 02/25/2020 0756   AST 16 02/25/2020 0756   ALT 15 02/25/2020 0756   BILITOT 0.5 02/25/2020 0756       RADIOGRAPHIC STUDIES: DG Chest 2 View  Result Date: 01/29/2020 CLINICAL DATA:  History of lung cancer. Patient status post left upper lobectomy 12/16/2019 with a small postoperative pneumothorax. EXAM: CHEST - 2 VIEW COMPARISON:  Single-view of the chest 12/18/2019. FINDINGS: Again seen is volume loss in the left chest and suture material at the left hilum consistent with postoperative change. Small left pneumothorax has resolved. Lungs are clear. Heart size is normal. No pleural fluid. No acute or focal bony abnormality. IMPRESSION: Postoperative change left chest. Small left pneumothorax seen on the prior exam  is resolved. No acute abnormality. Electronically Signed   By: Inge Rise M.D.   On: 01/29/2020 11:31    ASSESSMENT AND PLAN: This is a very pleasant 69 years old white male recently diagnosed with a stage IIb (T1 a, N1, M0) non-small cell lung cancer, adenocarcinoma in June 2021 status post left upper lobectomy with lymph node dissection on December 16, 2019 under the care of Dr. Kipp Brood.  The patient has no actionable mutations and PD-L1 expression was 1%. He was also diagnosed with pelvic GIST tumor. The patient is currently undergoing adjuvant systemic chemotherapy with cisplatin 75 mg/M2 and Alimta 500 mg/M2 every 3 weeks status post 1 cycle.  He tolerated the first cycle of his treatment well with no concerning adverse effects. I recommended for him to proceed with cycle #2 today as planned. He will come back for follow-up visit in 3 weeks for evaluation before the next cycle of his treatment. For the GIST, we will monitor it for now until completion of the adjuvant chemotherapy for the lung cancer.  We may consider the patient for treatment with neoadjuvant Gleevec followed by surgical resection after his systemic chemotherapy for the lung cancer. The patient was advised to call immediately if he has any concerning symptoms in the interval. The patient voices understanding of current disease status and treatment options and is in agreement with the current care plan.  All questions were answered. The patient knows to call the clinic with any problems, questions or concerns. We can certainly see the patient much sooner if necessary.   Disclaimer: This note was dictated with voice recognition software. Similar sounding words can inadvertently be transcribed and may not be corrected upon review.

## 2020-02-25 NOTE — Patient Instructions (Signed)
[facility] Discharge Instructions for Patients Receiving Chemotherapy  Today you received the following chemotherapy agents: pemetrexed/cisplatin.  To help prevent nausea and vomiting after your treatment, we encourage you to take your nausea medication as directed.   If you develop nausea and vomiting that is not controlled by your nausea medication, call the clinic.   BELOW ARE SYMPTOMS THAT SHOULD BE REPORTED IMMEDIATELY:  *FEVER GREATER THAN 100.5 F  *CHILLS WITH OR WITHOUT FEVER  NAUSEA AND VOMITING THAT IS NOT CONTROLLED WITH YOUR NAUSEA MEDICATION  *UNUSUAL SHORTNESS OF BREATH  *UNUSUAL BRUISING OR BLEEDING  TENDERNESS IN MOUTH AND THROAT WITH OR WITHOUT PRESENCE OF ULCERS  *URINARY PROBLEMS  *BOWEL PROBLEMS  UNUSUAL RASH Items with * indicate a potential emergency and should be followed up as soon as possible.  Feel free to call the clinic should you have any questions or concerns. The clinic phone number is [phone].  Please show the [facility] at check-in to the Emergency Department and triage nurse.

## 2020-02-25 NOTE — Progress Notes (Signed)
DCP-001, Use of a Clinical Trial Screening Tool to Address Cancer Health Disparities in the [place] Program [facility]) I met with patient this morning, alone, in the treatment room as he and I discussed yesterday over the phone. The consent and authorization forms were reviewed with the patient in their entirety. All of patient's questions were answered to his satisfaction and he agreed to participate. The consent and authorization forms were signed and dated by the patient. Patient was provided with copies of the signed documents as well as my contact information. Patient was thanked for his time contribution and was encouraged to call me with questions.  Patient meets eligibility and will be enrolled in the DCP-001 study.  Approximately 15 minutes was spent consenting patient.  Maxwell Marion, RN, BSN, [facility] Clinical Research 02/25/2020 12:27 PM

## 2020-02-29 DIAGNOSIS — C49A5 Gastrointestinal stromal tumor of rectum: Secondary | ICD-10-CM | POA: Diagnosis not present

## 2020-02-29 DIAGNOSIS — Z86010 Personal history of colonic polyps: Secondary | ICD-10-CM | POA: Diagnosis not present

## 2020-02-29 DIAGNOSIS — K5909 Other constipation: Secondary | ICD-10-CM | POA: Diagnosis not present

## 2020-02-29 DIAGNOSIS — C3492 Malignant neoplasm of unspecified part of left bronchus or lung: Secondary | ICD-10-CM | POA: Diagnosis not present

## 2020-03-03 ENCOUNTER — Inpatient Hospital Stay: Payer: Medicare Other

## 2020-03-03 ENCOUNTER — Other Ambulatory Visit: Payer: Self-pay

## 2020-03-03 DIAGNOSIS — Z5111 Encounter for antineoplastic chemotherapy: Secondary | ICD-10-CM | POA: Diagnosis not present

## 2020-03-03 DIAGNOSIS — Z8551 Personal history of malignant neoplasm of bladder: Secondary | ICD-10-CM | POA: Diagnosis not present

## 2020-03-03 DIAGNOSIS — I10 Essential (primary) hypertension: Secondary | ICD-10-CM | POA: Diagnosis not present

## 2020-03-03 DIAGNOSIS — Z902 Acquired absence of lung [part of]: Secondary | ICD-10-CM | POA: Diagnosis not present

## 2020-03-03 DIAGNOSIS — Z79899 Other long term (current) drug therapy: Secondary | ICD-10-CM | POA: Diagnosis not present

## 2020-03-03 DIAGNOSIS — Z23 Encounter for immunization: Secondary | ICD-10-CM | POA: Diagnosis not present

## 2020-03-03 DIAGNOSIS — C49A9 Gastrointestinal stromal tumor of other sites: Secondary | ICD-10-CM | POA: Diagnosis not present

## 2020-03-03 DIAGNOSIS — C3492 Malignant neoplasm of unspecified part of left bronchus or lung: Secondary | ICD-10-CM

## 2020-03-03 DIAGNOSIS — D649 Anemia, unspecified: Secondary | ICD-10-CM | POA: Diagnosis not present

## 2020-03-03 DIAGNOSIS — E559 Vitamin D deficiency, unspecified: Secondary | ICD-10-CM | POA: Diagnosis not present

## 2020-03-03 DIAGNOSIS — C3412 Malignant neoplasm of upper lobe, left bronchus or lung: Secondary | ICD-10-CM | POA: Diagnosis not present

## 2020-03-03 DIAGNOSIS — E785 Hyperlipidemia, unspecified: Secondary | ICD-10-CM | POA: Diagnosis not present

## 2020-03-03 LAB — CBC WITH DIFFERENTIAL (CANCER CENTER ONLY)
Abs Immature Granulocytes: 0.01 10*3/uL (ref 0.00–0.07)
Basophils Absolute: 0 10*3/uL (ref 0.0–0.1)
Basophils Relative: 1 %
Eosinophils Absolute: 0 10*3/uL (ref 0.0–0.5)
Eosinophils Relative: 1 %
HCT: 37.2 % — ABNORMAL LOW (ref 39.0–52.0)
Hemoglobin: 12.6 g/dL — ABNORMAL LOW (ref 13.0–17.0)
Immature Granulocytes: 0 %
Lymphocytes Relative: 46 %
Lymphs Abs: 1.5 10*3/uL (ref 0.7–4.0)
MCH: 31.9 pg (ref 26.0–34.0)
MCHC: 33.9 g/dL (ref 30.0–36.0)
MCV: 94.2 fL (ref 80.0–100.0)
Monocytes Absolute: 0.3 10*3/uL (ref 0.1–1.0)
Monocytes Relative: 9 %
Neutro Abs: 1.4 10*3/uL — ABNORMAL LOW (ref 1.7–7.7)
Neutrophils Relative %: 43 %
Platelet Count: 138 10*3/uL — ABNORMAL LOW (ref 150–400)
RBC: 3.95 MIL/uL — ABNORMAL LOW (ref 4.22–5.81)
RDW: 12.8 % (ref 11.5–15.5)
WBC Count: 3.3 10*3/uL — ABNORMAL LOW (ref 4.0–10.5)
nRBC: 0 % (ref 0.0–0.2)

## 2020-03-03 LAB — CMP (CANCER CENTER ONLY)
ALT: 21 U/L (ref 0–44)
AST: 26 U/L (ref 15–41)
Albumin: 4 g/dL (ref 3.5–5.0)
Alkaline Phosphatase: 96 U/L (ref 38–126)
Anion gap: 8 (ref 5–15)
BUN: 25 mg/dL — ABNORMAL HIGH (ref 8–23)
CO2: 27 mmol/L (ref 22–32)
Calcium: 9.7 mg/dL (ref 8.9–10.3)
Chloride: 103 mmol/L (ref 98–111)
Creatinine: 0.85 mg/dL (ref 0.61–1.24)
GFR, Estimated: >60 mL/min (ref >60)
Glucose, Bld: 104 mg/dL — ABNORMAL HIGH (ref 70–99)
Potassium: 4.4 mmol/L (ref 3.5–5.1)
Sodium: 138 mmol/L (ref 135–145)
Total Bilirubin: 0.5 mg/dL (ref 0.3–1.2)
Total Protein: 7.4 g/dL (ref 6.5–8.1)

## 2020-03-03 LAB — MAGNESIUM: Magnesium: 1.9 mg/dL (ref 1.7–2.4)

## 2020-03-09 ENCOUNTER — Inpatient Hospital Stay: Payer: Medicare Other

## 2020-03-09 ENCOUNTER — Other Ambulatory Visit: Payer: Self-pay

## 2020-03-09 DIAGNOSIS — Z23 Encounter for immunization: Secondary | ICD-10-CM | POA: Diagnosis not present

## 2020-03-09 DIAGNOSIS — I10 Essential (primary) hypertension: Secondary | ICD-10-CM | POA: Diagnosis not present

## 2020-03-09 DIAGNOSIS — E559 Vitamin D deficiency, unspecified: Secondary | ICD-10-CM | POA: Diagnosis not present

## 2020-03-09 DIAGNOSIS — Z8551 Personal history of malignant neoplasm of bladder: Secondary | ICD-10-CM | POA: Diagnosis not present

## 2020-03-09 DIAGNOSIS — C3412 Malignant neoplasm of upper lobe, left bronchus or lung: Secondary | ICD-10-CM | POA: Diagnosis not present

## 2020-03-09 DIAGNOSIS — C49A9 Gastrointestinal stromal tumor of other sites: Secondary | ICD-10-CM | POA: Diagnosis not present

## 2020-03-09 DIAGNOSIS — E785 Hyperlipidemia, unspecified: Secondary | ICD-10-CM | POA: Diagnosis not present

## 2020-03-09 DIAGNOSIS — Z5111 Encounter for antineoplastic chemotherapy: Secondary | ICD-10-CM | POA: Diagnosis not present

## 2020-03-09 DIAGNOSIS — Z79899 Other long term (current) drug therapy: Secondary | ICD-10-CM | POA: Diagnosis not present

## 2020-03-09 DIAGNOSIS — D649 Anemia, unspecified: Secondary | ICD-10-CM | POA: Diagnosis not present

## 2020-03-09 DIAGNOSIS — Z902 Acquired absence of lung [part of]: Secondary | ICD-10-CM | POA: Diagnosis not present

## 2020-03-09 DIAGNOSIS — C3492 Malignant neoplasm of unspecified part of left bronchus or lung: Secondary | ICD-10-CM

## 2020-03-09 LAB — CBC WITH DIFFERENTIAL (CANCER CENTER ONLY)
Abs Immature Granulocytes: 0.01 10*3/uL (ref 0.00–0.07)
Basophils Absolute: 0 10*3/uL (ref 0.0–0.1)
Basophils Relative: 0 %
Eosinophils Absolute: 0 10*3/uL (ref 0.0–0.5)
Eosinophils Relative: 1 %
HCT: 34.3 % — ABNORMAL LOW (ref 39.0–52.0)
Hemoglobin: 12 g/dL — ABNORMAL LOW (ref 13.0–17.0)
Immature Granulocytes: 0 %
Lymphocytes Relative: 29 %
Lymphs Abs: 1.3 10*3/uL (ref 0.7–4.0)
MCH: 32.9 pg (ref 26.0–34.0)
MCHC: 35 g/dL (ref 30.0–36.0)
MCV: 94 fL (ref 80.0–100.0)
Monocytes Absolute: 0.4 10*3/uL (ref 0.1–1.0)
Monocytes Relative: 8 %
Neutro Abs: 2.9 10*3/uL (ref 1.7–7.7)
Neutrophils Relative %: 62 %
Platelet Count: 108 10*3/uL — ABNORMAL LOW (ref 150–400)
RBC: 3.65 MIL/uL — ABNORMAL LOW (ref 4.22–5.81)
RDW: 13.4 % (ref 11.5–15.5)
WBC Count: 4.6 10*3/uL (ref 4.0–10.5)
nRBC: 0 % (ref 0.0–0.2)

## 2020-03-09 LAB — CMP (CANCER CENTER ONLY)
ALT: 17 U/L (ref 0–44)
AST: 22 U/L (ref 15–41)
Albumin: 3.8 g/dL (ref 3.5–5.0)
Alkaline Phosphatase: 86 U/L (ref 38–126)
Anion gap: 10 (ref 5–15)
BUN: 18 mg/dL (ref 8–23)
CO2: 25 mmol/L (ref 22–32)
Calcium: 9.6 mg/dL (ref 8.9–10.3)
Chloride: 104 mmol/L (ref 98–111)
Creatinine: 0.81 mg/dL (ref 0.61–1.24)
GFR, Estimated: >60 mL/min (ref >60)
Glucose, Bld: 106 mg/dL — ABNORMAL HIGH (ref 70–99)
Potassium: 4.5 mmol/L (ref 3.5–5.1)
Sodium: 139 mmol/L (ref 135–145)
Total Bilirubin: 0.6 mg/dL (ref 0.3–1.2)
Total Protein: 6.9 g/dL (ref 6.5–8.1)

## 2020-03-09 LAB — MAGNESIUM: Magnesium: 1.7 mg/dL (ref 1.7–2.4)

## 2020-03-11 ENCOUNTER — Other Ambulatory Visit: Payer: Medicare Other

## 2020-03-17 ENCOUNTER — Inpatient Hospital Stay: Payer: Medicare Other | Attending: Internal Medicine | Admitting: Internal Medicine

## 2020-03-17 ENCOUNTER — Encounter: Payer: Self-pay | Admitting: Internal Medicine

## 2020-03-17 ENCOUNTER — Inpatient Hospital Stay: Payer: Medicare Other

## 2020-03-17 ENCOUNTER — Other Ambulatory Visit: Payer: Self-pay

## 2020-03-17 VITALS — BP 133/67 | HR 84 | Temp 97.2°F | Resp 19 | Ht 69.0 in | Wt 170.7 lb

## 2020-03-17 DIAGNOSIS — C3491 Malignant neoplasm of unspecified part of right bronchus or lung: Secondary | ICD-10-CM

## 2020-03-17 DIAGNOSIS — Z23 Encounter for immunization: Secondary | ICD-10-CM | POA: Diagnosis not present

## 2020-03-17 DIAGNOSIS — C3492 Malignant neoplasm of unspecified part of left bronchus or lung: Secondary | ICD-10-CM

## 2020-03-17 DIAGNOSIS — E559 Vitamin D deficiency, unspecified: Secondary | ICD-10-CM | POA: Insufficient documentation

## 2020-03-17 DIAGNOSIS — D649 Anemia, unspecified: Secondary | ICD-10-CM | POA: Diagnosis not present

## 2020-03-17 DIAGNOSIS — E785 Hyperlipidemia, unspecified: Secondary | ICD-10-CM | POA: Diagnosis not present

## 2020-03-17 DIAGNOSIS — Z8551 Personal history of malignant neoplasm of bladder: Secondary | ICD-10-CM | POA: Diagnosis not present

## 2020-03-17 DIAGNOSIS — I10 Essential (primary) hypertension: Secondary | ICD-10-CM

## 2020-03-17 DIAGNOSIS — C49A9 Gastrointestinal stromal tumor of other sites: Secondary | ICD-10-CM | POA: Diagnosis not present

## 2020-03-17 DIAGNOSIS — Z79899 Other long term (current) drug therapy: Secondary | ICD-10-CM | POA: Insufficient documentation

## 2020-03-17 DIAGNOSIS — C3412 Malignant neoplasm of upper lobe, left bronchus or lung: Secondary | ICD-10-CM | POA: Diagnosis not present

## 2020-03-17 DIAGNOSIS — J449 Chronic obstructive pulmonary disease, unspecified: Secondary | ICD-10-CM | POA: Diagnosis not present

## 2020-03-17 DIAGNOSIS — Z5111 Encounter for antineoplastic chemotherapy: Secondary | ICD-10-CM | POA: Diagnosis not present

## 2020-03-17 DIAGNOSIS — K219 Gastro-esophageal reflux disease without esophagitis: Secondary | ICD-10-CM | POA: Diagnosis not present

## 2020-03-17 LAB — CMP (CANCER CENTER ONLY)
ALT: 15 U/L (ref 0–44)
AST: 18 U/L (ref 15–41)
Albumin: 3.7 g/dL (ref 3.5–5.0)
Alkaline Phosphatase: 88 U/L (ref 38–126)
Anion gap: 13 (ref 5–15)
BUN: 22 mg/dL (ref 8–23)
CO2: 22 mmol/L (ref 22–32)
Calcium: 10 mg/dL (ref 8.9–10.3)
Chloride: 105 mmol/L (ref 98–111)
Creatinine: 0.82 mg/dL (ref 0.61–1.24)
GFR, Estimated: >60 mL/min (ref >60)
Glucose, Bld: 107 mg/dL — ABNORMAL HIGH (ref 70–99)
Potassium: 4.1 mmol/L (ref 3.5–5.1)
Sodium: 140 mmol/L (ref 135–145)
Total Bilirubin: 0.4 mg/dL (ref 0.3–1.2)
Total Protein: 7 g/dL (ref 6.5–8.1)

## 2020-03-17 LAB — CBC WITH DIFFERENTIAL (CANCER CENTER ONLY)
Abs Immature Granulocytes: 0.01 10*3/uL (ref 0.00–0.07)
Basophils Absolute: 0 10*3/uL (ref 0.0–0.1)
Basophils Relative: 0 %
Eosinophils Absolute: 0 10*3/uL (ref 0.0–0.5)
Eosinophils Relative: 0 %
HCT: 34.1 % — ABNORMAL LOW (ref 39.0–52.0)
Hemoglobin: 11.8 g/dL — ABNORMAL LOW (ref 13.0–17.0)
Immature Granulocytes: 0 %
Lymphocytes Relative: 20 %
Lymphs Abs: 1 10*3/uL (ref 0.7–4.0)
MCH: 32.8 pg (ref 26.0–34.0)
MCHC: 34.6 g/dL (ref 30.0–36.0)
MCV: 94.7 fL (ref 80.0–100.0)
Monocytes Absolute: 0.4 10*3/uL (ref 0.1–1.0)
Monocytes Relative: 8 %
Neutro Abs: 3.5 10*3/uL (ref 1.7–7.7)
Neutrophils Relative %: 72 %
Platelet Count: 171 10*3/uL (ref 150–400)
RBC: 3.6 MIL/uL — ABNORMAL LOW (ref 4.22–5.81)
RDW: 14 % (ref 11.5–15.5)
WBC Count: 4.9 10*3/uL (ref 4.0–10.5)
nRBC: 0 % (ref 0.0–0.2)

## 2020-03-17 LAB — MAGNESIUM: Magnesium: 1.7 mg/dL (ref 1.7–2.4)

## 2020-03-17 MED ORDER — HEPARIN SOD (PORK) LOCK FLUSH 100 UNIT/ML IV SOLN
500.0000 [IU] | Freq: Once | INTRAVENOUS | Status: DC | PRN
  Filled 2020-03-17: qty 5

## 2020-03-17 MED ORDER — SODIUM CHLORIDE 0.9% FLUSH
10.0000 mL | INTRAVENOUS | Status: DC | PRN
  Filled 2020-03-17: qty 10

## 2020-03-17 MED ORDER — SODIUM CHLORIDE 0.9 % IV SOLN
Freq: Once | INTRAVENOUS | Status: AC
  Filled 2020-03-17: qty 10

## 2020-03-17 MED ORDER — SODIUM CHLORIDE 0.9 % IV SOLN
150.0000 mg | Freq: Once | INTRAVENOUS | Status: AC
  Administered 2020-03-17: 150 mg via INTRAVENOUS
  Filled 2020-03-17: qty 150

## 2020-03-17 MED ORDER — SODIUM CHLORIDE 0.9 % IV SOLN
Freq: Once | INTRAVENOUS | Status: AC
  Filled 2020-03-17: qty 250

## 2020-03-17 MED ORDER — SODIUM CHLORIDE 0.9 % IV SOLN
500.0000 mg/m2 | Freq: Once | INTRAVENOUS | Status: AC
  Administered 2020-03-17: 1000 mg via INTRAVENOUS
  Filled 2020-03-17: qty 40

## 2020-03-17 MED ORDER — PALONOSETRON HCL INJECTION 0.25 MG/5ML
INTRAVENOUS | Status: AC
  Filled 2020-03-17: qty 5

## 2020-03-17 MED ORDER — CYANOCOBALAMIN 1000 MCG/ML IJ SOLN
INTRAMUSCULAR | Status: AC
  Filled 2020-03-17: qty 1

## 2020-03-17 MED ORDER — SODIUM CHLORIDE 0.9 % IV SOLN
77.5000 mg/m2 | Freq: Once | INTRAVENOUS | Status: AC
  Administered 2020-03-17: 150 mg via INTRAVENOUS
  Filled 2020-03-17: qty 150

## 2020-03-17 MED ORDER — PALONOSETRON HCL INJECTION 0.25 MG/5ML
0.2500 mg | Freq: Once | INTRAVENOUS | Status: AC
  Administered 2020-03-17: 0.25 mg via INTRAVENOUS

## 2020-03-17 MED ORDER — CYANOCOBALAMIN 1000 MCG/ML IJ SOLN
1000.0000 ug | Freq: Once | INTRAMUSCULAR | Status: AC
  Administered 2020-03-17: 1000 ug via INTRAMUSCULAR

## 2020-03-17 MED ORDER — SODIUM CHLORIDE 0.9 % IV SOLN
10.0000 mg | Freq: Once | INTRAVENOUS | Status: AC
  Administered 2020-03-17: 10 mg via INTRAVENOUS
  Filled 2020-03-17: qty 10

## 2020-03-17 NOTE — Progress Notes (Signed)
Patient in no acute distress at discharge. Ambulated independently to exit with personal belongings.

## 2020-03-17 NOTE — Progress Notes (Signed)
[facility] [phone]   [phone]  OFFICE PROGRESS NOTE  Jesse Ruddy, MD [place] [address]  DIAGNOSIS:  1) Stage IIB (T1 a, N1, M0) non-small cell lung cancer, adenocarcinoma diagnosed in June 2021. 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  Biomarker Findings Tumor Mutational Burden - 20 Muts/Mb Microsatellite status - MS-Stable Genomic Findings For a complete list of the genes assayed, please refer to the Appendix. KEAP1 I466f*17 [id number] RB1 splice site [id number]>[id number]* 8 Disease relevant genes with no reportable alterations: ALK, BRAF, EGFR, ERBB2, KRAS, MET, RET, ROS1  PDL1 Expression: 1 %  PRIOR THERAPY: status post left upper lobectomy with lymph node dissection on December 16, 2019 under the care of Dr. LKipp Brood  The tumor size measured 0.9 cm but there was involvement of the level 10 L and 11 L.  CURRENT THERAPY: Adjuvant systemic chemotherapy with cisplatin 75 mg/M2 and Alimta 500 mg/M2 every 3 weeks.  First dose February 04, 2020.  Status post 2 cycles.  INTERVAL HISTORY: Jesse HUSKINS69y.o. male returns to the clinic today for follow-up visit.  The patient is feeling fine today with no concerning complaints except for mild fatigue for few days after the chemotherapy in addition to constipation and lack of appetite.  He did not lose any weight recently.  He denied having any chest pain, shortness of breath, cough or hemoptysis.  He denied having any fever or chills.  He has no nausea, vomiting, diarrhea or abdominal pain.  He has no headache or visual changes.  He is here today for evaluation before starting cycle #3 of his treatment with chemotherapy. .Marland Kitchen MEDICAL HISTORY: Past Medical History:  Diagnosis Date  . Anemia   . Cancer ([facility]    bladder cancer  . COPD (chronic obstructive pulmonary disease) (HCC)    mild   . DIVERTICULOSIS, COLON 01/14/2007   Qualifier:  Diagnosis of  By: MPaulina FusiRN, JDaine Gravel  . GASTRITIS, CHRONIC 06/01/2004   Qualifier: Diagnosis of  By: SNolon RodCMA (AAMA), Robin    . GERD 01/09/2007   Qualifier: Diagnosis of  By: TScherrie Gerlach   . Heart murmur    as a child; no murmur heard 12/14/19  . HYPERLIPIDEMIA 01/09/2007   Qualifier: Diagnosis of  By: TScherrie Gerlach   . Hypertension   . Lung mass    left upper nodule  . Macular degeneration    right eye  . NEOPLASM, MALIGNANT, BLADDER, HX OF 2002   Qualifier: Diagnosis of  By: SNolon RodCMA (AAMA), Robin  / no chemo or radiation  . OSTEOARTHRITIS 01/14/2007   Qualifier: Diagnosis of  By: MPaulina FusiRN, JDaine Gravel- knees  . Rectal mass   . Reflux esophagitis 06/01/2004   Qualifier: Diagnosis of  By: SNolon RodCMA (AAMA), Robin    . TOBACCO USER 02/16/2009   Qualifier: Diagnosis of  By: KBurnice Logan MD, PDoretha Sou  . VITAMIN D DEFICIENCY 06/03/2007   Qualifier: Diagnosis of  By: KBurnice Logan MD, PDoretha Sou    ALLERGIES:  has No Known Allergies.  MEDICATIONS:  Current Outpatient Medications  Medication Sig Dispense Refill  . amLODipine (NORVASC) 10 MG tablet Take 1 tablet (10 mg total) by mouth daily. (Patient taking differently: Take 5 mg by mouth daily. ) 30 tablet 1  . atorvastatin (LIPITOR) 40 MG tablet Take 40 mg by mouth daily.    .Marland Kitchen  dexamethasone (DECADRON) 4 MG tablet 1 tablet p.o. twice daily the day before, day of and day after chemotherapy every 3 weeks. 30 tablet 0  . diphenhydrAMINE (BENADRYL) 25 mg capsule Take 1-2 capsules (25-50 mg total) by mouth at bedtime as needed for sleep. 30 capsule 0  . folic acid (FOLVITE) 1 MG tablet Take 1 tablet (1 mg total) by mouth daily. 30 tablet 4  . magnesium oxide (MAG-OX) 400 MG tablet TAKE 1 TABLET BY MOUTH TWICE A DAY 60 tablet 2  . omeprazole (PRILOSEC) 40 MG capsule Take 1 capsule (40 mg total) by mouth daily. (Patient taking differently: Take 40 mg by mouth daily before breakfast. ) 90 capsule 3  . prochlorperazine  (COMPAZINE) 10 MG tablet Take 1 tablet (10 mg total) by mouth every 6 (six) hours as needed for nausea or vomiting. 30 tablet 0   No current facility-administered medications for this visit.    SURGICAL HISTORY:  Past Surgical History:  Procedure Laterality Date  . BRONCHIAL BIOPSY  10/13/2019   Procedure: BRONCHIAL BIOPSIES;  Surgeon: Garner Nash, DO;  Location: [facility];  Service: Pulmonary;;  . BRONCHIAL BRUSHINGS  10/13/2019   Procedure: BRONCHIAL BRUSHINGS;  Surgeon: Garner Nash, DO;  Location: [facility];  Service: Pulmonary;;  . BRONCHIAL NEEDLE ASPIRATION BIOPSY  10/13/2019   Procedure: BRONCHIAL NEEDLE ASPIRATION BIOPSIES;  Surgeon: Garner Nash, DO;  Location: [facility];  Service: Pulmonary;;  . BRONCHIAL WASHINGS  10/13/2019   Procedure: BRONCHIAL WASHINGS;  Surgeon: Garner Nash, DO;  Location: [facility];  Service: Pulmonary;;  . COLONOSCOPY     multiple  . INTERCOSTAL NERVE BLOCK Left 12/16/2019   Procedure: INTERCOSTAL NERVE BLOCK;  Surgeon: Lajuana Matte, MD;  Location: [facility];  Service: Thoracic;  Laterality: Left;  . KNEE SURGERY  07/20/2008   bilat.  Marland Kitchen NASAL SEPTUM SURGERY  1995  . NODE DISSECTION Left 12/16/2019   Procedure: NODE DISSECTION;  Surgeon: Lajuana Matte, MD;  Location: [facility];  Service: Thoracic;  Laterality: Left;  . TONSILLECTOMY    . TRANSURETHRAL RESECTION OF BLADDER TUMOR  2002  . UPPER GASTROINTESTINAL ENDOSCOPY    . VIDEO BRONCHOSCOPY WITH ENDOBRONCHIAL NAVIGATION N/A 10/13/2019   Procedure: VIDEO BRONCHOSCOPY WITH ENDOBRONCHIAL NAVIGATION;  Surgeon: Garner Nash, DO;  Location: [facility];  Service: Pulmonary;  Laterality: N/A;  . WISDOM TOOTH EXTRACTION      REVIEW OF SYSTEMS:  A comprehensive review of systems was negative except for: Constitutional: positive for anorexia and fatigue Gastrointestinal: positive for constipation   PHYSICAL EXAMINATION: General appearance: alert, cooperative and no  distress Head: Normocephalic, without obvious abnormality, atraumatic Neck: no adenopathy, no JVD, supple, symmetrical, trachea midline and thyroid not enlarged, symmetric, no tenderness/mass/nodules Lymph nodes: Cervical, supraclavicular, and axillary nodes normal. Resp: clear to auscultation bilaterally Back: symmetric, no curvature. ROM normal. No CVA tenderness. Cardio: regular rate and rhythm, S1, S2 normal, no murmur, click, rub or gallop GI: soft, non-tender; bowel sounds normal; no masses,  no organomegaly Extremities: extremities normal, atraumatic, no cyanosis or edema  ECOG PERFORMANCE STATUS: 1 - Symptomatic but completely ambulatory  Blood pressure 133/67, pulse 84, temperature (!) 97.2 F (36.2 C), temperature source Tympanic, resp. rate 19, height '5\' 9"'  (1.753 m), weight 170 lb 11.2 oz (77.4 kg), SpO2 99 %.  LABORATORY DATA: Lab Results  Component Value Date   WBC 4.9 03/17/2020   HGB 11.8 (L) 03/17/2020   HCT 34.1 (L) 03/17/2020   MCV 94.7 03/17/2020  PLT 171 03/17/2020      Chemistry      Component Value Date/Time   NA 139 03/09/2020 0750   K 4.5 03/09/2020 0750   CL 104 03/09/2020 0750   CO2 25 03/09/2020 0750   BUN 18 03/09/2020 0750   CREATININE 0.81 03/09/2020 0750      Component Value Date/Time   CALCIUM 9.6 03/09/2020 0750   ALKPHOS 86 03/09/2020 0750   AST 22 03/09/2020 0750   ALT 17 03/09/2020 0750   BILITOT 0.6 03/09/2020 0750       RADIOGRAPHIC STUDIES: No results found.  ASSESSMENT AND PLAN: This is a very pleasant 69 years old white male recently diagnosed with a stage IIb (T1 a, N1, M0) non-small cell lung cancer, adenocarcinoma in June 2021 status post left upper lobectomy with lymph node dissection on December 16, 2019 under the care of Dr. Kipp Brood.  The patient has no actionable mutations and PD-L1 expression was 1%. He was also diagnosed with pelvic GIST tumor. The patient is currently undergoing adjuvant systemic chemotherapy  with cisplatin 75 mg/M2 and Alimta 500 mg/M2 every 3 weeks status post 2 cycles.  The patient continues to tolerate this treatment well with no concerning adverse effect except for mild fatigue and intermittent constipation. I recommended for him to proceed with cycle #3 today as planned. I will see him back for follow-up visit in 3 weeks for evaluation before the last cycle of his adjuvant therapy. For the GIST, we will monitor it for now until completion of the adjuvant chemotherapy for the lung cancer.  We may consider the patient for treatment with neoadjuvant Gleevec followed by surgical resection after his systemic chemotherapy for the lung cancer. The patient was advised to call immediately if he has any other concerning symptoms in the interval. The patient voices understanding of current disease status and treatment options and is in agreement with the current care plan.  All questions were answered. The patient knows to call the clinic with any problems, questions or concerns. We can certainly see the patient much sooner if necessary.   Disclaimer: This note was dictated with voice recognition software. Similar sounding words can inadvertently be transcribed and may not be corrected upon review.

## 2020-03-17 NOTE — Patient Instructions (Signed)
[facility] Discharge Instructions for Patients Receiving Chemotherapy  Today you received the following chemotherapy agents: alimta, cisplatin   To help prevent nausea and vomiting after your treatment, we encourage you to take your nausea medication as directed.    If you develop nausea and vomiting that is not controlled by your nausea medication, call the clinic.   BELOW ARE SYMPTOMS THAT SHOULD BE REPORTED IMMEDIATELY:  *FEVER GREATER THAN 100.5 F  *CHILLS WITH OR WITHOUT FEVER  NAUSEA AND VOMITING THAT IS NOT CONTROLLED WITH YOUR NAUSEA MEDICATION  *UNUSUAL SHORTNESS OF BREATH  *UNUSUAL BRUISING OR BLEEDING  TENDERNESS IN MOUTH AND THROAT WITH OR WITHOUT PRESENCE OF ULCERS  *URINARY PROBLEMS  *BOWEL PROBLEMS  UNUSUAL RASH Items with * indicate a potential emergency and should be followed up as soon as possible.  Feel free to call the clinic should you have any questions or concerns. The clinic phone number is [phone].  Please show the [facility] at check-in to the Emergency Department and triage nurse.

## 2020-03-18 NOTE — Progress Notes (Signed)
Late entry    [facility]  Name:  Jesse [name]    MRN: [id number] DOB: 26-Dec-1950  03/18/2020  Jesse [name] was observed post Covid-19 immunization for 15 minutes without incident. He was provided with Vaccine Information Sheet and instruction to access the V-Safe system.   Jesse [name] was instructed to call 911 with any severe reactions post vaccine: Marland Kitchen Difficulty breathing  . Swelling of face and throat  . A fast heartbeat  . A bad rash all over body  . Dizziness and weakness

## 2020-03-24 ENCOUNTER — Inpatient Hospital Stay: Payer: Medicare Other

## 2020-03-24 ENCOUNTER — Other Ambulatory Visit: Payer: Self-pay

## 2020-03-24 DIAGNOSIS — D649 Anemia, unspecified: Secondary | ICD-10-CM | POA: Diagnosis not present

## 2020-03-24 DIAGNOSIS — Z79899 Other long term (current) drug therapy: Secondary | ICD-10-CM | POA: Diagnosis not present

## 2020-03-24 DIAGNOSIS — Z23 Encounter for immunization: Secondary | ICD-10-CM | POA: Diagnosis not present

## 2020-03-24 DIAGNOSIS — Z5111 Encounter for antineoplastic chemotherapy: Secondary | ICD-10-CM | POA: Diagnosis not present

## 2020-03-24 DIAGNOSIS — E559 Vitamin D deficiency, unspecified: Secondary | ICD-10-CM | POA: Diagnosis not present

## 2020-03-24 DIAGNOSIS — Z8551 Personal history of malignant neoplasm of bladder: Secondary | ICD-10-CM | POA: Diagnosis not present

## 2020-03-24 DIAGNOSIS — K219 Gastro-esophageal reflux disease without esophagitis: Secondary | ICD-10-CM | POA: Diagnosis not present

## 2020-03-24 DIAGNOSIS — C3412 Malignant neoplasm of upper lobe, left bronchus or lung: Secondary | ICD-10-CM | POA: Diagnosis not present

## 2020-03-24 DIAGNOSIS — C3492 Malignant neoplasm of unspecified part of left bronchus or lung: Secondary | ICD-10-CM

## 2020-03-24 DIAGNOSIS — E785 Hyperlipidemia, unspecified: Secondary | ICD-10-CM | POA: Diagnosis not present

## 2020-03-24 DIAGNOSIS — I10 Essential (primary) hypertension: Secondary | ICD-10-CM | POA: Diagnosis not present

## 2020-03-24 DIAGNOSIS — C49A9 Gastrointestinal stromal tumor of other sites: Secondary | ICD-10-CM | POA: Diagnosis not present

## 2020-03-24 DIAGNOSIS — J449 Chronic obstructive pulmonary disease, unspecified: Secondary | ICD-10-CM | POA: Diagnosis not present

## 2020-03-24 LAB — CBC WITH DIFFERENTIAL (CANCER CENTER ONLY)
Abs Immature Granulocytes: 0 10*3/uL (ref 0.00–0.07)
Basophils Absolute: 0 10*3/uL (ref 0.0–0.1)
Basophils Relative: 1 %
Eosinophils Absolute: 0 10*3/uL (ref 0.0–0.5)
Eosinophils Relative: 2 %
HCT: 34.3 % — ABNORMAL LOW (ref 39.0–52.0)
Hemoglobin: 11.8 g/dL — ABNORMAL LOW (ref 13.0–17.0)
Immature Granulocytes: 0 %
Lymphocytes Relative: 44 %
Lymphs Abs: 1.2 10*3/uL (ref 0.7–4.0)
MCH: 32.4 pg (ref 26.0–34.0)
MCHC: 34.4 g/dL (ref 30.0–36.0)
MCV: 94.2 fL (ref 80.0–100.0)
Monocytes Absolute: 0.2 10*3/uL (ref 0.1–1.0)
Monocytes Relative: 9 %
Neutro Abs: 1.2 10*3/uL — ABNORMAL LOW (ref 1.7–7.7)
Neutrophils Relative %: 44 %
Platelet Count: 108 10*3/uL — ABNORMAL LOW (ref 150–400)
RBC: 3.64 MIL/uL — ABNORMAL LOW (ref 4.22–5.81)
RDW: 13.5 % (ref 11.5–15.5)
WBC Count: 2.7 10*3/uL — ABNORMAL LOW (ref 4.0–10.5)
nRBC: 0 % (ref 0.0–0.2)

## 2020-03-24 LAB — CMP (CANCER CENTER ONLY)
ALT: 19 U/L (ref 0–44)
AST: 27 U/L (ref 15–41)
Albumin: 3.7 g/dL (ref 3.5–5.0)
Alkaline Phosphatase: 91 U/L (ref 38–126)
Anion gap: 8 (ref 5–15)
BUN: 23 mg/dL (ref 8–23)
CO2: 28 mmol/L (ref 22–32)
Calcium: 9.7 mg/dL (ref 8.9–10.3)
Chloride: 102 mmol/L (ref 98–111)
Creatinine: 0.81 mg/dL (ref 0.61–1.24)
GFR, Estimated: >60 mL/min (ref >60)
Glucose, Bld: 106 mg/dL — ABNORMAL HIGH (ref 70–99)
Potassium: 4.2 mmol/L (ref 3.5–5.1)
Sodium: 138 mmol/L (ref 135–145)
Total Bilirubin: 0.4 mg/dL (ref 0.3–1.2)
Total Protein: 7.1 g/dL (ref 6.5–8.1)

## 2020-03-24 LAB — MAGNESIUM: Magnesium: 1.8 mg/dL (ref 1.7–2.4)

## 2020-03-31 ENCOUNTER — Inpatient Hospital Stay: Payer: Medicare Other

## 2020-03-31 ENCOUNTER — Other Ambulatory Visit: Payer: Self-pay

## 2020-03-31 DIAGNOSIS — J449 Chronic obstructive pulmonary disease, unspecified: Secondary | ICD-10-CM | POA: Diagnosis not present

## 2020-03-31 DIAGNOSIS — Z23 Encounter for immunization: Secondary | ICD-10-CM | POA: Diagnosis not present

## 2020-03-31 DIAGNOSIS — E559 Vitamin D deficiency, unspecified: Secondary | ICD-10-CM | POA: Diagnosis not present

## 2020-03-31 DIAGNOSIS — Z5111 Encounter for antineoplastic chemotherapy: Secondary | ICD-10-CM | POA: Diagnosis not present

## 2020-03-31 DIAGNOSIS — D649 Anemia, unspecified: Secondary | ICD-10-CM | POA: Diagnosis not present

## 2020-03-31 DIAGNOSIS — C3412 Malignant neoplasm of upper lobe, left bronchus or lung: Secondary | ICD-10-CM | POA: Diagnosis not present

## 2020-03-31 DIAGNOSIS — C3492 Malignant neoplasm of unspecified part of left bronchus or lung: Secondary | ICD-10-CM

## 2020-03-31 DIAGNOSIS — I10 Essential (primary) hypertension: Secondary | ICD-10-CM | POA: Diagnosis not present

## 2020-03-31 DIAGNOSIS — Z8551 Personal history of malignant neoplasm of bladder: Secondary | ICD-10-CM | POA: Diagnosis not present

## 2020-03-31 DIAGNOSIS — K219 Gastro-esophageal reflux disease without esophagitis: Secondary | ICD-10-CM | POA: Diagnosis not present

## 2020-03-31 DIAGNOSIS — Z79899 Other long term (current) drug therapy: Secondary | ICD-10-CM | POA: Diagnosis not present

## 2020-03-31 DIAGNOSIS — C49A9 Gastrointestinal stromal tumor of other sites: Secondary | ICD-10-CM | POA: Diagnosis not present

## 2020-03-31 DIAGNOSIS — E785 Hyperlipidemia, unspecified: Secondary | ICD-10-CM | POA: Diagnosis not present

## 2020-03-31 LAB — CBC WITH DIFFERENTIAL (CANCER CENTER ONLY)
Abs Immature Granulocytes: 0.02 10*3/uL (ref 0.00–0.07)
Basophils Absolute: 0 10*3/uL (ref 0.0–0.1)
Basophils Relative: 0 %
Eosinophils Absolute: 0 10*3/uL (ref 0.0–0.5)
Eosinophils Relative: 1 %
HCT: 33.9 % — ABNORMAL LOW (ref 39.0–52.0)
Hemoglobin: 11.6 g/dL — ABNORMAL LOW (ref 13.0–17.0)
Immature Granulocytes: 0 %
Lymphocytes Relative: 29 %
Lymphs Abs: 1.3 10*3/uL (ref 0.7–4.0)
MCH: 32.6 pg (ref 26.0–34.0)
MCHC: 34.2 g/dL (ref 30.0–36.0)
MCV: 95.2 fL (ref 80.0–100.0)
Monocytes Absolute: 0.4 10*3/uL (ref 0.1–1.0)
Monocytes Relative: 8 %
Neutro Abs: 2.9 10*3/uL (ref 1.7–7.7)
Neutrophils Relative %: 62 %
Platelet Count: 126 10*3/uL — ABNORMAL LOW (ref 150–400)
RBC: 3.56 MIL/uL — ABNORMAL LOW (ref 4.22–5.81)
RDW: 14.3 % (ref 11.5–15.5)
WBC Count: 4.6 10*3/uL (ref 4.0–10.5)
nRBC: 0 % (ref 0.0–0.2)

## 2020-03-31 LAB — MAGNESIUM: Magnesium: 1.6 mg/dL — ABNORMAL LOW (ref 1.7–2.4)

## 2020-03-31 LAB — CMP (CANCER CENTER ONLY)
ALT: 21 U/L (ref 0–44)
AST: 29 U/L (ref 15–41)
Albumin: 3.7 g/dL (ref 3.5–5.0)
Alkaline Phosphatase: 99 U/L (ref 38–126)
Anion gap: 8 (ref 5–15)
BUN: 21 mg/dL (ref 8–23)
CO2: 26 mmol/L (ref 22–32)
Calcium: 9.6 mg/dL (ref 8.9–10.3)
Chloride: 105 mmol/L (ref 98–111)
Creatinine: 0.83 mg/dL (ref 0.61–1.24)
GFR, Estimated: >60 mL/min (ref >60)
Glucose, Bld: 104 mg/dL — ABNORMAL HIGH (ref 70–99)
Potassium: 4.5 mmol/L (ref 3.5–5.1)
Sodium: 139 mmol/L (ref 135–145)
Total Bilirubin: 0.5 mg/dL (ref 0.3–1.2)
Total Protein: 7 g/dL (ref 6.5–8.1)

## 2020-04-07 ENCOUNTER — Inpatient Hospital Stay: Payer: Medicare Other

## 2020-04-07 ENCOUNTER — Other Ambulatory Visit: Payer: Self-pay

## 2020-04-07 ENCOUNTER — Inpatient Hospital Stay (HOSPITAL_BASED_OUTPATIENT_CLINIC_OR_DEPARTMENT_OTHER): Payer: Medicare Other | Admitting: Internal Medicine

## 2020-04-07 ENCOUNTER — Encounter: Payer: Self-pay | Admitting: Internal Medicine

## 2020-04-07 VITALS — BP 116/70 | HR 89 | Temp 97.3°F | Resp 14 | Ht 69.0 in | Wt 172.2 lb

## 2020-04-07 DIAGNOSIS — C3490 Malignant neoplasm of unspecified part of unspecified bronchus or lung: Secondary | ICD-10-CM | POA: Diagnosis not present

## 2020-04-07 DIAGNOSIS — Z8551 Personal history of malignant neoplasm of bladder: Secondary | ICD-10-CM | POA: Diagnosis not present

## 2020-04-07 DIAGNOSIS — C3492 Malignant neoplasm of unspecified part of left bronchus or lung: Secondary | ICD-10-CM

## 2020-04-07 DIAGNOSIS — I10 Essential (primary) hypertension: Secondary | ICD-10-CM | POA: Diagnosis not present

## 2020-04-07 DIAGNOSIS — Z23 Encounter for immunization: Secondary | ICD-10-CM | POA: Diagnosis not present

## 2020-04-07 DIAGNOSIS — E559 Vitamin D deficiency, unspecified: Secondary | ICD-10-CM | POA: Diagnosis not present

## 2020-04-07 DIAGNOSIS — Z5111 Encounter for antineoplastic chemotherapy: Secondary | ICD-10-CM | POA: Diagnosis not present

## 2020-04-07 DIAGNOSIS — C49A9 Gastrointestinal stromal tumor of other sites: Secondary | ICD-10-CM | POA: Diagnosis not present

## 2020-04-07 DIAGNOSIS — K219 Gastro-esophageal reflux disease without esophagitis: Secondary | ICD-10-CM | POA: Diagnosis not present

## 2020-04-07 DIAGNOSIS — Z79899 Other long term (current) drug therapy: Secondary | ICD-10-CM | POA: Diagnosis not present

## 2020-04-07 DIAGNOSIS — E785 Hyperlipidemia, unspecified: Secondary | ICD-10-CM | POA: Diagnosis not present

## 2020-04-07 DIAGNOSIS — J449 Chronic obstructive pulmonary disease, unspecified: Secondary | ICD-10-CM | POA: Diagnosis not present

## 2020-04-07 DIAGNOSIS — C3412 Malignant neoplasm of upper lobe, left bronchus or lung: Secondary | ICD-10-CM | POA: Diagnosis not present

## 2020-04-07 DIAGNOSIS — D649 Anemia, unspecified: Secondary | ICD-10-CM | POA: Diagnosis not present

## 2020-04-07 LAB — CMP (CANCER CENTER ONLY)
ALT: 15 U/L (ref 0–44)
AST: 20 U/L (ref 15–41)
Albumin: 3.9 g/dL (ref 3.5–5.0)
Alkaline Phosphatase: 96 U/L (ref 38–126)
Anion gap: 13 (ref 5–15)
BUN: 23 mg/dL (ref 8–23)
CO2: 23 mmol/L (ref 22–32)
Calcium: 9.9 mg/dL (ref 8.9–10.3)
Chloride: 103 mmol/L (ref 98–111)
Creatinine: 0.82 mg/dL (ref 0.61–1.24)
GFR, Estimated: >60 mL/min (ref >60)
Glucose, Bld: 121 mg/dL — ABNORMAL HIGH (ref 70–99)
Potassium: 3.7 mmol/L (ref 3.5–5.1)
Sodium: 139 mmol/L (ref 135–145)
Total Bilirubin: 0.4 mg/dL (ref 0.3–1.2)
Total Protein: 7.5 g/dL (ref 6.5–8.1)

## 2020-04-07 LAB — CBC WITH DIFFERENTIAL (CANCER CENTER ONLY)
Abs Immature Granulocytes: 0.04 10*3/uL (ref 0.00–0.07)
Basophils Absolute: 0 10*3/uL (ref 0.0–0.1)
Basophils Relative: 0 %
Eosinophils Absolute: 0 10*3/uL (ref 0.0–0.5)
Eosinophils Relative: 0 %
HCT: 35.6 % — ABNORMAL LOW (ref 39.0–52.0)
Hemoglobin: 12.2 g/dL — ABNORMAL LOW (ref 13.0–17.0)
Immature Granulocytes: 1 %
Lymphocytes Relative: 15 %
Lymphs Abs: 0.9 10*3/uL (ref 0.7–4.0)
MCH: 32.5 pg (ref 26.0–34.0)
MCHC: 34.3 g/dL (ref 30.0–36.0)
MCV: 94.9 fL (ref 80.0–100.0)
Monocytes Absolute: 0.6 10*3/uL (ref 0.1–1.0)
Monocytes Relative: 9 %
Neutro Abs: 4.6 10*3/uL (ref 1.7–7.7)
Neutrophils Relative %: 75 %
Platelet Count: 245 10*3/uL (ref 150–400)
RBC: 3.75 MIL/uL — ABNORMAL LOW (ref 4.22–5.81)
RDW: 15 % (ref 11.5–15.5)
WBC Count: 6.2 10*3/uL (ref 4.0–10.5)
nRBC: 0 % (ref 0.0–0.2)

## 2020-04-07 LAB — MAGNESIUM: Magnesium: 1.5 mg/dL — ABNORMAL LOW (ref 1.7–2.4)

## 2020-04-07 MED ORDER — SODIUM CHLORIDE 0.9 % IV SOLN
Freq: Once | INTRAVENOUS | Status: AC
  Filled 2020-04-07: qty 250

## 2020-04-07 MED ORDER — SODIUM CHLORIDE 0.9 % IV SOLN
77.5000 mg/m2 | Freq: Once | INTRAVENOUS | Status: AC
  Administered 2020-04-07: 13:00:00 150 mg via INTRAVENOUS
  Filled 2020-04-07: qty 150

## 2020-04-07 MED ORDER — POTASSIUM CHLORIDE IN NACL 20-0.9 MEQ/L-% IV SOLN
Freq: Once | INTRAVENOUS | Status: AC
  Filled 2020-04-07: qty 1000

## 2020-04-07 MED ORDER — SODIUM CHLORIDE 0.9 % IV SOLN
500.0000 mg/m2 | Freq: Once | INTRAVENOUS | Status: AC
  Administered 2020-04-07: 13:00:00 1000 mg via INTRAVENOUS
  Filled 2020-04-07: qty 40

## 2020-04-07 MED ORDER — SODIUM CHLORIDE 0.9 % IV SOLN
10.0000 mg | Freq: Once | INTRAVENOUS | Status: AC
  Administered 2020-04-07: 11:00:00 10 mg via INTRAVENOUS
  Filled 2020-04-07: qty 10

## 2020-04-07 MED ORDER — MAGNESIUM SULFATE 2 GM/50ML IV SOLN
INTRAVENOUS | Status: AC
  Filled 2020-04-07: qty 50

## 2020-04-07 MED ORDER — SODIUM CHLORIDE 0.9 % IV SOLN
150.0000 mg | Freq: Once | INTRAVENOUS | Status: AC
  Administered 2020-04-07: 12:00:00 150 mg via INTRAVENOUS
  Filled 2020-04-07: qty 150

## 2020-04-07 MED ORDER — SODIUM CHLORIDE 0.9 % IV SOLN: Freq: Once | INTRAVENOUS | Status: DC

## 2020-04-07 MED ORDER — MAGNESIUM SULFATE 2 GM/50ML IV SOLN
2.0000 g | Freq: Once | INTRAVENOUS | Status: AC
  Administered 2020-04-07: 10:00:00 2 g via INTRAVENOUS

## 2020-04-07 MED ORDER — PALONOSETRON HCL INJECTION 0.25 MG/5ML
0.2500 mg | Freq: Once | INTRAVENOUS | Status: AC
  Administered 2020-04-07: 11:00:00 0.25 mg via INTRAVENOUS

## 2020-04-07 MED ORDER — PALONOSETRON HCL INJECTION 0.25 MG/5ML
INTRAVENOUS | Status: AC
  Filled 2020-04-07: qty 5

## 2020-04-07 NOTE — Patient Instructions (Addendum)
[facility] Discharge Instructions for Patients Receiving Chemotherapy  Happy Last Treatment Day!  Please increase magnesium dose to three times a day per Dr. Julien Nordmann.   Today you received the following chemotherapy agents: Pemetrexed (Alimta) and Cisplatin  To help prevent nausea and vomiting after your treatment, we encourage you to take your nausea medication  as prescribed.    If you develop nausea and vomiting that is not controlled by your nausea medication, call the clinic.   BELOW ARE SYMPTOMS THAT SHOULD BE REPORTED IMMEDIATELY:  *FEVER GREATER THAN 100.5 F  *CHILLS WITH OR WITHOUT FEVER  NAUSEA AND VOMITING THAT IS NOT CONTROLLED WITH YOUR NAUSEA MEDICATION  *UNUSUAL SHORTNESS OF BREATH  *UNUSUAL BRUISING OR BLEEDING  TENDERNESS IN MOUTH AND THROAT WITH OR WITHOUT PRESENCE OF ULCERS  *URINARY PROBLEMS  *BOWEL PROBLEMS  UNUSUAL RASH Items with * indicate a potential emergency and should be followed up as soon as possible.  Feel free to call the clinic should you have any questions or concerns. The clinic phone number is [phone].  Please show the [facility] at check-in to the Emergency Department and triage nurse.

## 2020-04-07 NOTE — Progress Notes (Signed)
Per Dr. Julien Nordmann patient is to increase Magnesium frequency from one tablet twice a day to one tablet three times a day. Patient still in infusion and patient's nurse Junie Panning, RN made patient aware of new instructions.

## 2020-04-07 NOTE — Progress Notes (Signed)
[place] [phone]   Fax:(336) [phone]  OFFICE PROGRESS NOTE  [name], MD [place] [address]  DIAGNOSIS:  1) Stage IIB (T1 a, N1, M0) non-small cell lung cancer, adenocarcinoma diagnosed in June 2021. 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  Biomarker Findings Tumor Mutational Burden - 20 Muts/Mb Microsatellite status - MS-Stable Genomic Findings For a complete list of the genes assayed, please refer to the Appendix. KEAP1 I453f*17 [id number] RB1 splice site [id number]+2T>[id number]* 8 Disease relevant genes with no reportable alterations: ALK, BRAF, EGFR, ERBB2, KRAS, MET, RET, ROS1  PDL1 Expression: 1 %  PRIOR THERAPY: status post left upper lobectomy with lymph node dissection on December 16, 2019 under the care of Dr. LKipp Brood  The tumor size measured 0.9 cm but there was involvement of the level 10 L and 11 L.  CURRENT THERAPY: Adjuvant systemic chemotherapy with cisplatin 75 mg/M2 and Alimta 500 mg/M2 every 3 weeks.  First dose February 04, 2020.  Status post 3 cycles.  INTERVAL HISTORY: Jesse TENNYSON69y.o. male returns to the clinic today for follow-up visit.  The patient is feeling fine with no concerning complaints except for intermittent dull pain in the left side of the chest at the area of the surgical scar.  He denied having any shortness of breath, cough or hemoptysis.  He denied having any fever or chills.  He has no nausea, vomiting, diarrhea or constipation.  He has no headache or visual changes.  He is here today for evaluation before starting cycle #4 of his treatment..Marland Kitchen MEDICAL HISTORY: Past Medical History:  Diagnosis Date   Anemia    Cancer ([facility]    bladder cancer   COPD (chronic obstructive pulmonary disease) ([facility]    mild    DIVERTICULOSIS, COLON 01/14/2007   Qualifier: Diagnosis of  By: MPaulina FusiRN, JDaine Gravel   GASTRITIS, CHRONIC 06/01/2004   Qualifier: Diagnosis  of  By: SNolon RodCMA (AAMA), Robin     GERD 01/09/2007   Qualifier: Diagnosis of  By: TSherren Mocha RN, Ellen     Heart murmur    as a child; no murmur heard 12/14/19   HYPERLIPIDEMIA 01/09/2007   Qualifier: Diagnosis of  By: TSherren MochaRN, EDorian Pod    Hypertension    Lung mass    left upper nodule   Macular degeneration    right eye   NEOPLASM, MALIGNANT, BLADDER, HX OF 2002   Qualifier: Diagnosis of  By: SNolon RodCMA (AAMA), Robin  / no chemo or radiation   OSTEOARTHRITIS 01/14/2007   Qualifier: Diagnosis of  By: MPaulina Fusi RN, JDaine Gravel- knees   Rectal mass    Reflux esophagitis 06/01/2004   Qualifier: Diagnosis of  By: SNolon RodCMA (AAMA), Robin     TOBACCO USER 02/16/2009   Qualifier: Diagnosis of  By: KBurnice Logan MD, PDoretha Sou   VITAMIN D DEFICIENCY 06/03/2007   Qualifier: Diagnosis of  By: KBurnice Logan MD, PDoretha Sou    ALLERGIES:  has No Known Allergies.  MEDICATIONS:  Current Outpatient Medications  Medication Sig Dispense Refill   amLODipine (NORVASC) 10 MG tablet Take 1 tablet (10 mg total) by mouth daily. (Patient taking differently: Take 5 mg by mouth daily. ) 30 tablet 1   atorvastatin (LIPITOR) 40 MG tablet Take 40 mg by mouth daily.     dexamethasone (DECADRON) 4 MG tablet 1 tablet p.o. twice daily the day  before, day of and day after chemotherapy every 3 weeks. 30 tablet 0   diphenhydrAMINE (BENADRYL) 25 mg capsule Take 1-2 capsules (25-50 mg total) by mouth at bedtime as needed for sleep. 30 capsule 0   folic acid (FOLVITE) 1 MG tablet Take 1 tablet (1 mg total) by mouth daily. 30 tablet 4   magnesium oxide (MAG-OX) 400 MG tablet TAKE 1 TABLET BY MOUTH TWICE A DAY 60 tablet 2   omeprazole (PRILOSEC) 40 MG capsule Take 1 capsule (40 mg total) by mouth daily. (Patient taking differently: Take 40 mg by mouth daily before breakfast. ) 90 capsule 3   prochlorperazine (COMPAZINE) 10 MG tablet Take 1 tablet (10 mg total) by mouth every 6 (six) hours as needed for nausea or  vomiting. 30 tablet 0   No current facility-administered medications for this visit.    SURGICAL HISTORY:  Past Surgical History:  Procedure Laterality Date   BRONCHIAL BIOPSY  10/13/2019   Procedure: BRONCHIAL BIOPSIES;  Surgeon: Garner Nash, DO;  Location: [facility] [facility];  Service: Pulmonary;;   BRONCHIAL BRUSHINGS  10/13/2019   Procedure: BRONCHIAL BRUSHINGS;  Surgeon: Garner Nash, DO;  Location: [facility];  Service: Pulmonary;;   BRONCHIAL NEEDLE ASPIRATION BIOPSY  10/13/2019   Procedure: BRONCHIAL NEEDLE ASPIRATION BIOPSIES;  Surgeon: Garner Nash, DO;  Location: [facility];  Service: Pulmonary;;   BRONCHIAL WASHINGS  10/13/2019   Procedure: BRONCHIAL WASHINGS;  Surgeon: Garner Nash, DO;  Location: [facility];  Service: Pulmonary;;   COLONOSCOPY     multiple   INTERCOSTAL NERVE BLOCK Left 12/16/2019   Procedure: INTERCOSTAL NERVE BLOCK;  Surgeon: Lajuana Matte, MD;  Location: [facility];  Service: Thoracic;  Laterality: Left;   KNEE SURGERY  07/20/2008   bilat.   NASAL SEPTUM SURGERY  1995   NODE DISSECTION Left 12/16/2019   Procedure: NODE DISSECTION;  Surgeon: Lajuana Matte, MD;  Location: [place];  Service: Thoracic;  Laterality: Left;   TONSILLECTOMY     TRANSURETHRAL RESECTION OF BLADDER TUMOR  2002   UPPER GASTROINTESTINAL ENDOSCOPY     VIDEO BRONCHOSCOPY WITH ENDOBRONCHIAL NAVIGATION N/A 10/13/2019   Procedure: VIDEO BRONCHOSCOPY WITH ENDOBRONCHIAL NAVIGATION;  Surgeon: Garner Nash, DO;  Location: [facility];  Service: Pulmonary;  Laterality: N/A;   WISDOM TOOTH EXTRACTION      REVIEW OF SYSTEMS:  A comprehensive review of systems was negative except for: Constitutional: positive for fatigue Respiratory: positive for pleurisy/chest pain   PHYSICAL EXAMINATION: General appearance: alert, cooperative and no distress Head: Normocephalic, without obvious abnormality, atraumatic Neck: no adenopathy, no JVD, supple, symmetrical,  trachea midline and thyroid not enlarged, symmetric, no tenderness/mass/nodules Lymph nodes: Cervical, supraclavicular, and axillary nodes normal. Resp: clear to auscultation bilaterally Back: symmetric, no curvature. ROM normal. No CVA tenderness. Cardio: regular rate and rhythm, S1, S2 normal, no murmur, click, rub or gallop GI: soft, non-tender; bowel sounds normal; no masses,  no organomegaly Extremities: extremities normal, atraumatic, no cyanosis or edema  ECOG PERFORMANCE STATUS: 1 - Symptomatic but completely ambulatory  Blood pressure 116/70, pulse 89, temperature (!) 97.3 F (36.3 C), temperature source Tympanic, resp. rate 14, height '5\' 9"'  (1.753 m), weight 172 lb 3.2 oz (78.1 kg), SpO2 100 %.  LABORATORY DATA: Lab Results  Component Value Date   WBC 6.2 04/07/2020   HGB 12.2 (L) 04/07/2020   HCT 35.6 (L) 04/07/2020   MCV 94.9 04/07/2020   PLT 245 04/07/2020      Chemistry  Component Value Date/Time   NA 139 03/31/2020 0931   K 4.5 03/31/2020 0931   CL 105 03/31/2020 0931   CO2 26 03/31/2020 0931   BUN 21 03/31/2020 0931   CREATININE 0.83 03/31/2020 0931      Component Value Date/Time   CALCIUM 9.6 03/31/2020 0931   ALKPHOS 99 03/31/2020 0931   AST 29 03/31/2020 0931   ALT 21 03/31/2020 0931   BILITOT 0.5 03/31/2020 0931       RADIOGRAPHIC STUDIES: No results found.  ASSESSMENT AND PLAN: This is a very pleasant 69 years old white male recently diagnosed with a stage IIb (T1 a, N1, M0) non-small cell lung cancer, adenocarcinoma in June 2021 status post left upper lobectomy with lymph node dissection on December 16, 2019 under the care of Dr. Kipp Brood.  The patient has no actionable mutations and PD-L1 expression was 1%. He was also diagnosed with pelvic GIST tumor. The patient is currently undergoing adjuvant systemic chemotherapy with cisplatin 75 mg/M2 and Alimta 500 mg/M2 every 3 weeks status post 3 cycles.  He continues to tolerate his treatment  well with no concerning adverse effects. I recommended for him to proceed with cycle #4 today as planned. I will see him back for follow-up visit in 4 weeks for evaluation and repeat CT scan of the chest for restaging of his disease. For the GIST, we will monitor it for now until completion of the adjuvant chemotherapy for the lung cancer.  We may consider the patient for treatment with neoadjuvant Gleevec followed by surgical resection after his systemic chemotherapy for the lung cancer. He was advised to call immediately if he has any concerning symptoms in the interval. The patient voices understanding of current disease status and treatment options and is in agreement with the current care plan.  All questions were answered. The patient knows to call the clinic with any problems, questions or concerns. We can certainly see the patient much sooner if necessary.   Disclaimer: This note was dictated with voice recognition software. Similar sounding words can inadvertently be transcribed and may not be corrected upon review.

## 2020-04-07 NOTE — Progress Notes (Signed)
Omit mannitol from hydration fluids per protocol

## 2020-04-07 NOTE — Progress Notes (Signed)
Order to start pre hydration while waiting for CMP results per Dr Julien Nordmann

## 2020-04-12 ENCOUNTER — Telehealth: Payer: Self-pay | Admitting: Internal Medicine

## 2020-04-12 NOTE — Telephone Encounter (Signed)
Scheduled appts per 12/23 los. Pt to get updated appt calendar at next visit per appt notes.

## 2020-04-14 ENCOUNTER — Inpatient Hospital Stay: Payer: Medicare Other

## 2020-04-14 ENCOUNTER — Other Ambulatory Visit: Payer: Self-pay

## 2020-04-14 DIAGNOSIS — C49A9 Gastrointestinal stromal tumor of other sites: Secondary | ICD-10-CM | POA: Diagnosis not present

## 2020-04-14 DIAGNOSIS — Z8551 Personal history of malignant neoplasm of bladder: Secondary | ICD-10-CM | POA: Diagnosis not present

## 2020-04-14 DIAGNOSIS — E559 Vitamin D deficiency, unspecified: Secondary | ICD-10-CM | POA: Diagnosis not present

## 2020-04-14 DIAGNOSIS — C3492 Malignant neoplasm of unspecified part of left bronchus or lung: Secondary | ICD-10-CM

## 2020-04-14 DIAGNOSIS — E785 Hyperlipidemia, unspecified: Secondary | ICD-10-CM | POA: Diagnosis not present

## 2020-04-14 DIAGNOSIS — Z79899 Other long term (current) drug therapy: Secondary | ICD-10-CM | POA: Diagnosis not present

## 2020-04-14 DIAGNOSIS — I10 Essential (primary) hypertension: Secondary | ICD-10-CM | POA: Diagnosis not present

## 2020-04-14 DIAGNOSIS — C3412 Malignant neoplasm of upper lobe, left bronchus or lung: Secondary | ICD-10-CM | POA: Diagnosis not present

## 2020-04-14 DIAGNOSIS — Z5111 Encounter for antineoplastic chemotherapy: Secondary | ICD-10-CM | POA: Diagnosis not present

## 2020-04-14 DIAGNOSIS — K219 Gastro-esophageal reflux disease without esophagitis: Secondary | ICD-10-CM | POA: Diagnosis not present

## 2020-04-14 DIAGNOSIS — J449 Chronic obstructive pulmonary disease, unspecified: Secondary | ICD-10-CM | POA: Diagnosis not present

## 2020-04-14 DIAGNOSIS — Z23 Encounter for immunization: Secondary | ICD-10-CM | POA: Diagnosis not present

## 2020-04-14 DIAGNOSIS — D649 Anemia, unspecified: Secondary | ICD-10-CM | POA: Diagnosis not present

## 2020-04-14 LAB — CMP (CANCER CENTER ONLY)
ALT: 15 U/L (ref 0–44)
AST: 21 U/L (ref 15–41)
Albumin: 3.8 g/dL (ref 3.5–5.0)
Alkaline Phosphatase: 85 U/L (ref 38–126)
Anion gap: 3 — ABNORMAL LOW (ref 5–15)
BUN: 21 mg/dL (ref 8–23)
CO2: 31 mmol/L (ref 22–32)
Calcium: 9.8 mg/dL (ref 8.9–10.3)
Chloride: 102 mmol/L (ref 98–111)
Creatinine: 0.79 mg/dL (ref 0.61–1.24)
GFR, Estimated: >60 mL/min (ref >60)
Glucose, Bld: 108 mg/dL — ABNORMAL HIGH (ref 70–99)
Potassium: 4.4 mmol/L (ref 3.5–5.1)
Sodium: 136 mmol/L (ref 135–145)
Total Bilirubin: 0.6 mg/dL (ref 0.3–1.2)
Total Protein: 7.2 g/dL (ref 6.5–8.1)

## 2020-04-14 LAB — CBC WITH DIFFERENTIAL (CANCER CENTER ONLY)
Abs Immature Granulocytes: 0.01 10*3/uL (ref 0.00–0.07)
Basophils Absolute: 0 10*3/uL (ref 0.0–0.1)
Basophils Relative: 1 %
Eosinophils Absolute: 0 10*3/uL (ref 0.0–0.5)
Eosinophils Relative: 1 %
HCT: 34 % — ABNORMAL LOW (ref 39.0–52.0)
Hemoglobin: 11.7 g/dL — ABNORMAL LOW (ref 13.0–17.0)
Immature Granulocytes: 0 %
Lymphocytes Relative: 43 %
Lymphs Abs: 1.5 10*3/uL (ref 0.7–4.0)
MCH: 32.6 pg (ref 26.0–34.0)
MCHC: 34.4 g/dL (ref 30.0–36.0)
MCV: 94.7 fL (ref 80.0–100.0)
Monocytes Absolute: 0.3 10*3/uL (ref 0.1–1.0)
Monocytes Relative: 7 %
Neutro Abs: 1.7 10*3/uL (ref 1.7–7.7)
Neutrophils Relative %: 48 %
Platelet Count: 116 10*3/uL — ABNORMAL LOW (ref 150–400)
RBC: 3.59 MIL/uL — ABNORMAL LOW (ref 4.22–5.81)
RDW: 14.2 % (ref 11.5–15.5)
WBC Count: 3.5 10*3/uL — ABNORMAL LOW (ref 4.0–10.5)
nRBC: 0 % (ref 0.0–0.2)

## 2020-04-14 LAB — MAGNESIUM: Magnesium: 1.9 mg/dL (ref 1.7–2.4)

## 2020-04-21 ENCOUNTER — Inpatient Hospital Stay: Payer: Medicare Other | Attending: Internal Medicine

## 2020-04-21 ENCOUNTER — Other Ambulatory Visit: Payer: Self-pay

## 2020-04-21 DIAGNOSIS — C3412 Malignant neoplasm of upper lobe, left bronchus or lung: Secondary | ICD-10-CM | POA: Insufficient documentation

## 2020-04-21 DIAGNOSIS — C3492 Malignant neoplasm of unspecified part of left bronchus or lung: Secondary | ICD-10-CM

## 2020-04-21 LAB — CBC WITH DIFFERENTIAL (CANCER CENTER ONLY)
Abs Immature Granulocytes: 0.01 10*3/uL (ref 0.00–0.07)
Basophils Absolute: 0 10*3/uL (ref 0.0–0.1)
Basophils Relative: 0 %
Eosinophils Absolute: 0 10*3/uL (ref 0.0–0.5)
Eosinophils Relative: 0 %
HCT: 31.8 % — ABNORMAL LOW (ref 39.0–52.0)
Hemoglobin: 10.9 g/dL — ABNORMAL LOW (ref 13.0–17.0)
Immature Granulocytes: 0 %
Lymphocytes Relative: 22 %
Lymphs Abs: 1.2 10*3/uL (ref 0.7–4.0)
MCH: 33.1 pg (ref 26.0–34.0)
MCHC: 34.3 g/dL (ref 30.0–36.0)
MCV: 96.7 fL (ref 80.0–100.0)
Monocytes Absolute: 0.4 10*3/uL (ref 0.1–1.0)
Monocytes Relative: 8 %
Neutro Abs: 3.8 10*3/uL (ref 1.7–7.7)
Neutrophils Relative %: 70 %
Platelet Count: 134 10*3/uL — ABNORMAL LOW (ref 150–400)
RBC: 3.29 MIL/uL — ABNORMAL LOW (ref 4.22–5.81)
RDW: 14.6 % (ref 11.5–15.5)
WBC Count: 5.4 10*3/uL (ref 4.0–10.5)
nRBC: 0 % (ref 0.0–0.2)

## 2020-04-21 LAB — CMP (CANCER CENTER ONLY)
ALT: 13 U/L (ref 0–44)
AST: 22 U/L (ref 15–41)
Albumin: 3.7 g/dL (ref 3.5–5.0)
Alkaline Phosphatase: 93 U/L (ref 38–126)
Anion gap: 8 (ref 5–15)
BUN: 22 mg/dL (ref 8–23)
CO2: 25 mmol/L (ref 22–32)
Calcium: 9.7 mg/dL (ref 8.9–10.3)
Chloride: 105 mmol/L (ref 98–111)
Creatinine: 0.84 mg/dL (ref 0.61–1.24)
GFR, Estimated: >60 mL/min (ref >60)
Glucose, Bld: 106 mg/dL — ABNORMAL HIGH (ref 70–99)
Potassium: 4.5 mmol/L (ref 3.5–5.1)
Sodium: 138 mmol/L (ref 135–145)
Total Bilirubin: 0.7 mg/dL (ref 0.3–1.2)
Total Protein: 7 g/dL (ref 6.5–8.1)

## 2020-04-21 LAB — MAGNESIUM: Magnesium: 1.7 mg/dL (ref 1.7–2.4)

## 2020-04-26 ENCOUNTER — Other Ambulatory Visit: Payer: Self-pay | Admitting: Physician Assistant

## 2020-04-26 DIAGNOSIS — C3491 Malignant neoplasm of unspecified part of right bronchus or lung: Secondary | ICD-10-CM

## 2020-04-28 ENCOUNTER — Inpatient Hospital Stay: Payer: Medicare Other

## 2020-04-28 ENCOUNTER — Other Ambulatory Visit: Payer: Self-pay

## 2020-04-28 DIAGNOSIS — C3412 Malignant neoplasm of upper lobe, left bronchus or lung: Secondary | ICD-10-CM | POA: Diagnosis not present

## 2020-04-28 DIAGNOSIS — C3491 Malignant neoplasm of unspecified part of right bronchus or lung: Secondary | ICD-10-CM

## 2020-04-28 LAB — CBC WITH DIFFERENTIAL (CANCER CENTER ONLY)
Abs Immature Granulocytes: 0.01 10*3/uL (ref 0.00–0.07)
Basophils Absolute: 0 10*3/uL (ref 0.0–0.1)
Basophils Relative: 0 %
Eosinophils Absolute: 0.1 10*3/uL (ref 0.0–0.5)
Eosinophils Relative: 2 %
HCT: 32.5 % — ABNORMAL LOW (ref 39.0–52.0)
Hemoglobin: 10.9 g/dL — ABNORMAL LOW (ref 13.0–17.0)
Immature Granulocytes: 0 %
Lymphocytes Relative: 33 %
Lymphs Abs: 1.2 10*3/uL (ref 0.7–4.0)
MCH: 33.3 pg (ref 26.0–34.0)
MCHC: 33.5 g/dL (ref 30.0–36.0)
MCV: 99.4 fL (ref 80.0–100.0)
Monocytes Absolute: 0.3 10*3/uL (ref 0.1–1.0)
Monocytes Relative: 8 %
Neutro Abs: 2 10*3/uL (ref 1.7–7.7)
Neutrophils Relative %: 57 %
Platelet Count: 178 10*3/uL (ref 150–400)
RBC: 3.27 MIL/uL — ABNORMAL LOW (ref 4.22–5.81)
RDW: 15.3 % (ref 11.5–15.5)
WBC Count: 3.5 10*3/uL — ABNORMAL LOW (ref 4.0–10.5)
nRBC: 0 % (ref 0.0–0.2)

## 2020-04-28 LAB — CMP (CANCER CENTER ONLY)
ALT: 15 U/L (ref 0–44)
AST: 27 U/L (ref 15–41)
Albumin: 3.8 g/dL (ref 3.5–5.0)
Alkaline Phosphatase: 84 U/L (ref 38–126)
Anion gap: 7 (ref 5–15)
BUN: 19 mg/dL (ref 8–23)
CO2: 27 mmol/L (ref 22–32)
Calcium: 9.6 mg/dL (ref 8.9–10.3)
Chloride: 106 mmol/L (ref 98–111)
Creatinine: 0.83 mg/dL (ref 0.61–1.24)
GFR, Estimated: >60 mL/min (ref >60)
Glucose, Bld: 99 mg/dL (ref 70–99)
Potassium: 4.4 mmol/L (ref 3.5–5.1)
Sodium: 140 mmol/L (ref 135–145)
Total Bilirubin: 0.6 mg/dL (ref 0.3–1.2)
Total Protein: 7.1 g/dL (ref 6.5–8.1)

## 2020-05-02 ENCOUNTER — Telehealth: Payer: Self-pay | Admitting: Physician Assistant

## 2020-05-02 ENCOUNTER — Telehealth: Payer: Self-pay | Admitting: *Deleted

## 2020-05-02 NOTE — Telephone Encounter (Signed)
Received call from pt stating that he received a call that his labs are cancelled for tomorrow but to keep his CT scan.  He states that he lives in [place]. & roads are iced & he needs to cancel but has been on hold 45 min.  Informed that this nurse would call Central Scheduling to call him & change appt.  Message to Dr Olene Floss to r/s lab for CT & verify if MD app needs to be r/s also to after CT.

## 2020-05-02 NOTE — Telephone Encounter (Signed)
Cancelled appt per weather and Cassie staff message - appt for 1/18 lab cancelled left message for pt .

## 2020-05-03 ENCOUNTER — Ambulatory Visit (HOSPITAL_COMMUNITY): Payer: Medicare Other

## 2020-05-03 ENCOUNTER — Inpatient Hospital Stay: Payer: Medicare Other

## 2020-05-05 ENCOUNTER — Ambulatory Visit: Payer: Medicare Other | Admitting: Internal Medicine

## 2020-05-09 ENCOUNTER — Telehealth: Payer: Self-pay | Admitting: Medical Oncology

## 2020-05-09 NOTE — Telephone Encounter (Signed)
Covid + result via home test. CT scan not urgent and can be r/s in 21 days. Requests monoclonal antibody infusion. Referral made per Dr Julien Nordmann.

## 2020-05-10 ENCOUNTER — Other Ambulatory Visit: Payer: Self-pay | Admitting: Physician Assistant

## 2020-05-10 DIAGNOSIS — C3491 Malignant neoplasm of unspecified part of right bronchus or lung: Secondary | ICD-10-CM

## 2020-05-10 DIAGNOSIS — Z902 Acquired absence of lung [part of]: Secondary | ICD-10-CM

## 2020-05-10 DIAGNOSIS — I10 Essential (primary) hypertension: Secondary | ICD-10-CM

## 2020-05-10 DIAGNOSIS — U071 COVID-19: Secondary | ICD-10-CM

## 2020-05-10 DIAGNOSIS — Z8551 Personal history of malignant neoplasm of bladder: Secondary | ICD-10-CM

## 2020-05-10 DIAGNOSIS — E785 Hyperlipidemia, unspecified: Secondary | ICD-10-CM

## 2020-05-10 NOTE — Progress Notes (Signed)
I connected by phone with Jesse [name] on 05/10/2020 at 10:32 AM to discuss the potential use of a new treatment for mild to moderate COVID-19 viral infection in non-hospitalized patients.  This patient is a 70 y.o. male that meets the FDA criteria for Emergency Use Authorization of COVID monoclonal antibody sotrovimab.  Has a (+) direct SARS-CoV-2 viral test result  Has mild or moderate COVID-19   Is NOT hospitalized due to COVID-19  Is within 10 days of symptom onset  Has at least one of the high risk factor(s) for progression to severe COVID-19 and/or hospitalization as defined in EUA.  Specific high risk criteria : Older age (>/= 71 yo), Immunosuppressive Disease or Treatment, Cardiovascular disease or hypertension and Chronic Lung Disease   I have spoken and communicated the following to the patient or parent/caregiver regarding COVID monoclonal antibody treatment:  1. FDA has authorized the emergency use for the treatment of mild to moderate COVID-19 in adults and pediatric patients with positive results of direct SARS-CoV-2 viral testing who are 16 years of age and older weighing at least 40 kg, and who are at high risk for progressing to severe COVID-19 and/or hospitalization.  2. The significant known and potential risks and benefits of COVID monoclonal antibody, and the extent to which such potential risks and benefits are unknown.  3. Information on available alternative treatments and the risks and benefits of those alternatives, including clinical trials.  4. Patients treated with COVID monoclonal antibody should continue to self-isolate and use infection control measures (e.g., wear mask, isolate, social distance, avoid sharing personal items, clean and disinfect "high touch" surfaces, and frequent handwashing) according to CDC guidelines.   5. The patient or parent/caregiver has the option to accept or refuse COVID monoclonal antibody treatment.  After reviewing this  information with the patient, the patient has agreed to receive one of the available covid 19 monoclonal antibodies and will be provided an appropriate fact sheet prior to infusion.   [place], [place] 05/10/2020 10:32 AM

## 2020-05-11 ENCOUNTER — Other Ambulatory Visit (HOSPITAL_COMMUNITY): Payer: Self-pay

## 2020-05-11 ENCOUNTER — Ambulatory Visit (HOSPITAL_COMMUNITY)
Admission: RE | Admit: 2020-05-11 | Discharge: 2020-05-11 | Disposition: A | Discharge status: Home / Self Care | Payer: Medicare Other | Source: Ambulatory Visit | Attending: Pulmonary Disease | Admitting: Pulmonary Disease

## 2020-05-11 DIAGNOSIS — U071 COVID-19: Secondary | ICD-10-CM | POA: Diagnosis not present

## 2020-05-11 MED ORDER — DIPHENHYDRAMINE HCL 50 MG/ML IJ SOLN: 50.0000 mg | Freq: Once | INTRAMUSCULAR | Status: DC | PRN

## 2020-05-11 MED ORDER — SODIUM CHLORIDE 0.9 % IV SOLN: INTRAVENOUS | Status: DC | PRN

## 2020-05-11 MED ORDER — FAMOTIDINE IN NACL 20-0.9 MG/50ML-% IV SOLN: 20.0000 mg | Freq: Once | INTRAVENOUS | Status: DC | PRN

## 2020-05-11 MED ORDER — SOTROVIMAB 500 MG/8ML IV SOLN
500.0000 mg | Freq: Once | INTRAVENOUS | Status: AC
  Administered 2020-05-11: 500 mg via INTRAVENOUS

## 2020-05-11 MED ORDER — METHYLPREDNISOLONE SODIUM SUCC 125 MG IJ SOLR: 125.0000 mg | Freq: Once | INTRAMUSCULAR | Status: DC | PRN

## 2020-05-11 MED ORDER — EPINEPHRINE 0.3 MG/0.3ML IJ SOAJ: 0.3000 mg | Freq: Once | INTRAMUSCULAR | Status: DC | PRN

## 2020-05-11 MED ORDER — ALBUTEROL SULFATE HFA 108 (90 BASE) MCG/ACT IN AERS: 2.0000 | INHALATION_SPRAY | Freq: Once | RESPIRATORY_TRACT | Status: DC | PRN

## 2020-05-11 NOTE — Progress Notes (Signed)
Patient reviewed Fact Sheet for Patients, Parents, and Caregivers for Emergency Use Authorization (EUA) of sotrovimab for the Treatment of Coronavirus. Patient also reviewed and is agreeable to the estimated cost of treatment. Patient is agreeable to proceed.   

## 2020-05-11 NOTE — Discharge Instructions (Signed)

## 2020-05-11 NOTE — Progress Notes (Signed)
Diagnosis: COVID-19  Physician: Dr. Patrick Wright  Procedure: Covid Infusion Clinic Med: Sotrovimab infusion - Provided patient with sotrovimab fact sheet for patients, parents, and caregivers prior to infusion.   Complications: No immediate complications noted  Discharge: Discharged home    

## 2020-05-13 ENCOUNTER — Ambulatory Visit (HOSPITAL_COMMUNITY): Payer: Medicare Other

## 2020-05-14 ENCOUNTER — Other Ambulatory Visit: Payer: Self-pay | Admitting: Physician Assistant

## 2020-05-16 ENCOUNTER — Other Ambulatory Visit: Payer: Self-pay | Admitting: Physician Assistant

## 2020-05-16 ENCOUNTER — Ambulatory Visit: Payer: Medicare Other | Admitting: Internal Medicine

## 2020-05-23 ENCOUNTER — Ambulatory Visit (HOSPITAL_COMMUNITY): Payer: Medicare Other

## 2020-05-23 ENCOUNTER — Telehealth: Payer: Self-pay | Admitting: Internal Medicine

## 2020-05-23 NOTE — Telephone Encounter (Signed)
Called pt per 2/7 sch msg - no answer. Left message for patient with apt date and time

## 2020-05-25 ENCOUNTER — Ambulatory Visit: Payer: Medicare Other | Admitting: Internal Medicine

## 2020-05-27 ENCOUNTER — Other Ambulatory Visit: Payer: Self-pay

## 2020-05-27 ENCOUNTER — Ambulatory Visit (HOSPITAL_COMMUNITY)
Admission: RE | Admit: 2020-05-27 | Discharge: 2020-05-27 | Disposition: A | Discharge status: Home / Self Care | Payer: Medicare Other | Source: Ambulatory Visit | Attending: Internal Medicine | Admitting: Internal Medicine

## 2020-05-27 DIAGNOSIS — I700 Atherosclerosis of aorta: Secondary | ICD-10-CM | POA: Diagnosis not present

## 2020-05-27 DIAGNOSIS — C3490 Malignant neoplasm of unspecified part of unspecified bronchus or lung: Secondary | ICD-10-CM | POA: Insufficient documentation

## 2020-05-27 DIAGNOSIS — J432 Centrilobular emphysema: Secondary | ICD-10-CM | POA: Diagnosis not present

## 2020-05-27 DIAGNOSIS — I2510 Atherosclerotic heart disease of native coronary artery without angina pectoris: Secondary | ICD-10-CM | POA: Diagnosis not present

## 2020-05-27 MED ORDER — IOHEXOL 300 MG/ML  SOLN
100.0000 mL | Freq: Once | INTRAMUSCULAR | Status: AC | PRN
  Administered 2020-05-27: 75 mL via INTRAVENOUS

## 2020-05-30 ENCOUNTER — Other Ambulatory Visit: Payer: Self-pay

## 2020-05-30 ENCOUNTER — Inpatient Hospital Stay: Payer: Medicare Other | Attending: Internal Medicine | Admitting: Internal Medicine

## 2020-05-30 VITALS — BP 144/83 | HR 75 | Temp 97.9°F | Resp 15 | Ht 69.0 in | Wt 175.2 lb

## 2020-05-30 DIAGNOSIS — J439 Emphysema, unspecified: Secondary | ICD-10-CM | POA: Diagnosis not present

## 2020-05-30 DIAGNOSIS — C3492 Malignant neoplasm of unspecified part of left bronchus or lung: Secondary | ICD-10-CM

## 2020-05-30 DIAGNOSIS — I2510 Atherosclerotic heart disease of native coronary artery without angina pectoris: Secondary | ICD-10-CM | POA: Insufficient documentation

## 2020-05-30 DIAGNOSIS — C49A5 Gastrointestinal stromal tumor of rectum: Secondary | ICD-10-CM | POA: Diagnosis not present

## 2020-05-30 DIAGNOSIS — Z902 Acquired absence of lung [part of]: Secondary | ICD-10-CM | POA: Diagnosis not present

## 2020-05-30 DIAGNOSIS — Z7952 Long term (current) use of systemic steroids: Secondary | ICD-10-CM | POA: Diagnosis not present

## 2020-05-30 DIAGNOSIS — K219 Gastro-esophageal reflux disease without esophagitis: Secondary | ICD-10-CM | POA: Diagnosis not present

## 2020-05-30 DIAGNOSIS — Z9221 Personal history of antineoplastic chemotherapy: Secondary | ICD-10-CM | POA: Diagnosis not present

## 2020-05-30 DIAGNOSIS — C3491 Malignant neoplasm of unspecified part of right bronchus or lung: Secondary | ICD-10-CM | POA: Diagnosis not present

## 2020-05-30 DIAGNOSIS — I10 Essential (primary) hypertension: Secondary | ICD-10-CM | POA: Diagnosis not present

## 2020-05-30 DIAGNOSIS — C3412 Malignant neoplasm of upper lobe, left bronchus or lung: Secondary | ICD-10-CM | POA: Insufficient documentation

## 2020-05-30 DIAGNOSIS — E785 Hyperlipidemia, unspecified: Secondary | ICD-10-CM | POA: Insufficient documentation

## 2020-05-30 DIAGNOSIS — Z79899 Other long term (current) drug therapy: Secondary | ICD-10-CM | POA: Diagnosis not present

## 2020-05-30 NOTE — Progress Notes (Signed)
[place] Telephone:([phone]   Fax:(336) [phone]  OFFICE PROGRESS NOTE  [name], MD [place] [address]  DIAGNOSIS:  1) Stage IIB (T1 a, N1, M0) non-small cell lung cancer, adenocarcinoma diagnosed in June 2021. 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  Biomarker Findings Tumor Mutational Burden - 20 Muts/Mb Microsatellite status - MS-Stable Genomic Findings For a complete list of the genes assayed, please refer to the Appendix. KEAP1 I473fs*17 ERXV4M G867* RB1 splice site 6195+0D>T OI71 R209* 8 Disease relevant genes with no reportable alterations: ALK, BRAF, EGFR, ERBB2, KRAS, MET, RET, ROS1  PDL1 Expression: 1 %  PRIOR THERAPY:  1) status post left upper lobectomy with lymph node dissection on December 16, 2019 under the care of Dr. Kipp Brood.  The tumor size measured 0.9 cm but there was involvement of the level 10 L and 11 L.  2) Adjuvant systemic chemotherapy with cisplatin 75 mg/M2 and Alimta 500 mg/M2 every 3 weeks.  First dose February 04, 2020.  Status post 4 cycles.  CURRENT THERAPY: Observation.   INTERVAL HISTORY: Jesse [name] 70 y.o. male returns to the clinic today for follow-up visit accompanied by his wife.  The patient is feeling fine today with no concerning complaints except for fatigue and lack of stamina.  He denied having any chest pain but has occasional shortness of breath with exertion with no significant cough or hemoptysis.  He denied having any fever or chills.  He has no nausea, vomiting, diarrhea or constipation.  He completed a course of adjuvant systemic chemotherapy with cisplatin and Alimta.  The patient was diagnosed with COVID-19 several weeks ago but has no symptoms.  He is fully vaccinated.  He had repeat CT scan of the chest performed recently and he is here for evaluation and discussion of his discuss results.   MEDICAL HISTORY: Past Medical History:  Diagnosis Date   . Anemia   . Cancer [facility])    bladder cancer  . COPD (chronic obstructive pulmonary disease) ([facility])    mild   . DIVERTICULOSIS, COLON 01/14/2007   Qualifier: Diagnosis of  By: Paulina Fusi RN, [name]   . GASTRITIS, CHRONIC 06/01/2004   Qualifier: Diagnosis of  By: Nolon Rod CMA (AAMA), Robin    . GERD 01/09/2007   Qualifier: Diagnosis of  By: Scherrie Gerlach    . Heart murmur    as a child; no murmur heard 12/14/19  . HYPERLIPIDEMIA 01/09/2007   Qualifier: Diagnosis of  By: Scherrie Gerlach    . Hypertension   . Lung mass    left upper nodule  . Macular degeneration    right eye  . NEOPLASM, MALIGNANT, BLADDER, HX OF 2002   Qualifier: Diagnosis of  By: Nolon Rod CMA (AAMA), Robin  / no chemo or radiation  . OSTEOARTHRITIS 01/14/2007   Qualifier: Diagnosis of  By: Paulina Fusi RN, [name] - knees  . Rectal mass   . Reflux esophagitis 06/01/2004   Qualifier: Diagnosis of  By: Nolon Rod CMA (AAMA), Robin    . TOBACCO USER 02/16/2009   Qualifier: Diagnosis of  By: Burnice Logan  MD, Doretha Sou   . VITAMIN D DEFICIENCY 06/03/2007   Qualifier: Diagnosis of  By: Burnice Logan  MD, Doretha Sou     ALLERGIES:  has No Known Allergies.  MEDICATIONS:  Current Outpatient Medications  Medication Sig Dispense Refill  . amLODipine (NORVASC) 10 MG tablet Take 1 tablet (10 mg total) by mouth  daily. (Patient taking differently: Take 5 mg by mouth daily. ) 30 tablet 1  . atorvastatin (LIPITOR) 40 MG tablet Take 40 mg by mouth daily.    Marland Kitchen dexamethasone (DECADRON) 4 MG tablet 1 tablet p.o. twice daily the day before, day of and day after chemotherapy every 3 weeks. 30 tablet 0  . diphenhydrAMINE (BENADRYL) 25 mg capsule Take 1-2 capsules (25-50 mg total) by mouth at bedtime as needed for sleep. 30 capsule 0  . folic acid (FOLVITE) 1 MG tablet Take 1 tablet (1 mg total) by mouth daily. 30 tablet 4  . magnesium oxide (MAG-OX) 400 MG tablet Take 1 tablet (400 mg total) by mouth daily. 30 tablet 0  . omeprazole (PRILOSEC) 40  MG capsule Take 1 capsule (40 mg total) by mouth daily. (Patient taking differently: Take 40 mg by mouth daily before breakfast. ) 90 capsule 3  . prochlorperazine (COMPAZINE) 10 MG tablet Take 1 tablet (10 mg total) by mouth every 6 (six) hours as needed for nausea or vomiting. 30 tablet 0   No current facility-administered medications for this visit.    SURGICAL HISTORY:  Past Surgical History:  Procedure Laterality Date  . BRONCHIAL BIOPSY  10/13/2019   Procedure: BRONCHIAL BIOPSIES;  Surgeon: Garner Nash, DO;  Location: [place];  Service: Pulmonary;;  . BRONCHIAL BRUSHINGS  10/13/2019   Procedure: BRONCHIAL BRUSHINGS;  Surgeon: Garner Nash, DO;  Location: [place] [facility];  Service: Pulmonary;;  . BRONCHIAL NEEDLE ASPIRATION BIOPSY  10/13/2019   Procedure: BRONCHIAL NEEDLE ASPIRATION BIOPSIES;  Surgeon: Garner Nash, DO;  Location: [place] [facility];  Service: Pulmonary;;  . BRONCHIAL WASHINGS  10/13/2019   Procedure: BRONCHIAL WASHINGS;  Surgeon: Garner Nash, DO;  Location: [place];  Service: Pulmonary;;  . COLONOSCOPY     multiple  . INTERCOSTAL NERVE BLOCK Left 12/16/2019   Procedure: INTERCOSTAL NERVE BLOCK;  Surgeon: Lajuana Matte, MD;  Location: [place];  Service: Thoracic;  Laterality: Left;  . KNEE SURGERY  07/20/2008   bilat.  Marland Kitchen NASAL SEPTUM SURGERY  1995  . NODE DISSECTION Left 12/16/2019   Procedure: NODE DISSECTION;  Surgeon: Lajuana Matte, MD;  Location: [place];  Service: Thoracic;  Laterality: Left;  . TONSILLECTOMY    . TRANSURETHRAL RESECTION OF BLADDER TUMOR  2002  . UPPER GASTROINTESTINAL ENDOSCOPY    . VIDEO BRONCHOSCOPY WITH ENDOBRONCHIAL NAVIGATION N/A 10/13/2019   Procedure: VIDEO BRONCHOSCOPY WITH ENDOBRONCHIAL NAVIGATION;  Surgeon: Garner Nash, DO;  Location: [place];  Service: Pulmonary;  Laterality: N/A;  . WISDOM TOOTH EXTRACTION      REVIEW OF SYSTEMS:  Constitutional: positive for fatigue Eyes: negative Ears, nose,  mouth, throat, and face: negative Respiratory: positive for dyspnea on exertion Cardiovascular: negative Gastrointestinal: negative Genitourinary:negative Integument/breast: negative Hematologic/lymphatic: negative Musculoskeletal:negative Neurological: negative Behavioral/Psych: negative Endocrine: negative Allergic/Immunologic: negative   PHYSICAL EXAMINATION: General appearance: alert, cooperative and no distress Head: Normocephalic, without obvious abnormality, atraumatic Neck: no adenopathy, no JVD, supple, symmetrical, trachea midline and thyroid not enlarged, symmetric, no tenderness/mass/nodules Lymph nodes: Cervical, supraclavicular, and axillary nodes normal. Resp: clear to auscultation bilaterally Back: symmetric, no curvature. ROM normal. No CVA tenderness. Cardio: regular rate and rhythm, S1, S2 normal, no murmur, click, rub or gallop GI: soft, non-tender; bowel sounds normal; no masses,  no organomegaly Extremities: extremities normal, atraumatic, no cyanosis or edema  ECOG PERFORMANCE STATUS: 1 - Symptomatic but completely ambulatory  Blood pressure (!) 144/83, pulse 75, temperature 97.9 F (36.6 C), temperature source  Tympanic, resp. rate 15, height $RemoveBe'5\' 9"'TPdgZkVTw$  (1.753 m), weight 175 lb 3.2 oz (79.5 kg), SpO2 100 %.  LABORATORY DATA: Lab Results  Component Value Date   WBC 3.5 (L) 04/28/2020   HGB 10.9 (L) 04/28/2020   HCT 32.5 (L) 04/28/2020   MCV 99.4 04/28/2020   PLT 178 04/28/2020      Chemistry      Component Value Date/Time   NA 140 04/28/2020 0920   K 4.4 04/28/2020 0920   CL 106 04/28/2020 0920   CO2 27 04/28/2020 0920   BUN 19 04/28/2020 0920   CREATININE 0.83 04/28/2020 0920      Component Value Date/Time   CALCIUM 9.6 04/28/2020 0920   ALKPHOS 84 04/28/2020 0920   AST 27 04/28/2020 0920   ALT 15 04/28/2020 0920   BILITOT 0.6 04/28/2020 0920       RADIOGRAPHIC STUDIES: CT Chest W Contrast  Result Date: 05/29/2020 CLINICAL DATA:  Lung  cancer staging EXAM: CT CHEST WITH CONTRAST TECHNIQUE: Multidetector CT imaging of the chest was performed during intravenous contrast administration. CONTRAST:  42mL OMNIPAQUE IOHEXOL 300 MG/ML  SOLN COMPARISON:  CT chest, 10/06/2019, PET-CT, 09/16/2019 FINDINGS: Cardiovascular: Aortic atherosclerosis. Normal heart size. Three-vessel coronary artery calcifications. No pericardial effusion. Mediastinum/Nodes: No enlarged mediastinal, hilar, or axillary lymph nodes. Thyroid gland, trachea, and esophagus demonstrate no significant findings. Lungs/Pleura: Status post interval left upper lobectomy. Mild centrilobular emphysema. Stable adjacent nodules of the right pulmonary apex measuring 5 mm and 4 mm (series 5, image 26). No pleural effusion or pneumothorax. Upper Abdomen: No acute abnormality. Musculoskeletal: No chest wall mass or suspicious bone lesions identified. IMPRESSION: 1. Status post interval left upper lobectomy. 2. Stable adjacent nodules of the right pulmonary apex measuring 5 mm and 4 mm, nonspecific although likely incidental sequelae of prior infection inflammation, not previously FDG avid on PET-CT. 3. Emphysema. 4. Coronary artery disease. Aortic Atherosclerosis (ICD10-I70.0) and Emphysema (ICD10-J43.9). Electronically Signed   By: Eddie Candle M.D.   On: 05/29/2020 10:59    ASSESSMENT AND PLAN: This is a very pleasant 70 years old white male recently diagnosed with a stage IIb (T1 a, N1, M0) non-small cell lung cancer, adenocarcinoma in June 2021 status post left upper lobectomy with lymph node dissection on December 16, 2019 under the care of Dr. Kipp Brood.  The patient has no actionable mutations and PD-L1 expression was 1%. He was also diagnosed with pelvic GIST tumor. The patient completed a course of adjuvant systemic chemotherapy with cisplatin 75 mg/M2 and Alimta 500 mg/M2 every 3 weeks status post 4 cycles.  He tolerated his treatment well except for fatigue and occasional  nausea. The patient recovered from a recent COVID-19 infection. The patient had repeat CT scan of the chest performed recently.  I personally and independently reviewed the scan and discussed the results with the patient and his wife. His scan showed no concerning findings for disease recurrence or metastasis. I recommended for the patient to continue on observation with repeat CT scan of the chest in 3 months. For the history of GIST tumor of the pelvis, he is a scheduled to see Dr. Michael Boston next month for evaluation and consideration of surgical resection. I may also consider the patient for adjuvant Gleevec after resection. The patient was advised to call immediately if he has any concerning symptoms in the interval. The patient voices understanding of current disease status and treatment options and is in agreement with the current care plan.  All questions were  answered. The patient knows to call the clinic with any problems, questions or concerns. We can certainly see the patient much sooner if necessary.   Disclaimer: This note was dictated with voice recognition software. Similar sounding words can inadvertently be transcribed and may not be corrected upon review.

## 2020-05-31 ENCOUNTER — Telehealth: Payer: Self-pay | Admitting: Internal Medicine

## 2020-05-31 ENCOUNTER — Telehealth: Payer: Self-pay | Admitting: Pulmonary Disease

## 2020-05-31 NOTE — Telephone Encounter (Signed)
LMTCB with patient.   Will send message to BI to review to see if additional scan is needed.

## 2020-05-31 NOTE — Telephone Encounter (Signed)
No does not need scan. Since recent completed by Dr. Julien Nordmann for follow up.  Garner Nash, DO [facility] 05/31/2020 12:37 PM

## 2020-05-31 NOTE — Telephone Encounter (Signed)
Scheduled appts per 2/14 los. Pt confirmed appt date and time.

## 2020-05-31 NOTE — Telephone Encounter (Signed)
Call returned to patient, confirmed patient. Made aware of BI recommendations. Voiced understanding.   Nothing further needed at this time.

## 2020-06-10 ENCOUNTER — Other Ambulatory Visit: Payer: Self-pay | Admitting: Physician Assistant

## 2020-06-15 ENCOUNTER — Telehealth: Payer: Self-pay | Admitting: Family Medicine

## 2020-06-15 NOTE — Telephone Encounter (Signed)
Left message for patient to call back and schedule Medicare Annual Wellness Visit (AWV) either virtually or in office. No detailed message left    Last AWV 06/03/19  please schedule at anytime with [facility] Nurse Health Advisor 1 or 2   This should be a 45 minute visit.

## 2020-06-16 ENCOUNTER — Other Ambulatory Visit: Payer: Medicare Other

## 2020-06-17 ENCOUNTER — Other Ambulatory Visit: Payer: Self-pay | Admitting: Physician Assistant

## 2020-06-17 ENCOUNTER — Other Ambulatory Visit: Payer: Self-pay

## 2020-06-18 DIAGNOSIS — Z03818 Encounter for observation for suspected exposure to other biological agents ruled out: Secondary | ICD-10-CM | POA: Diagnosis not present

## 2020-06-18 DIAGNOSIS — Z20822 Contact with and (suspected) exposure to covid-19: Secondary | ICD-10-CM | POA: Diagnosis not present

## 2020-06-28 ENCOUNTER — Telehealth: Payer: Self-pay | Admitting: Internal Medicine

## 2020-06-28 NOTE — Telephone Encounter (Signed)
R/s appts per 3/15 sch msg. Pt aware.

## 2020-07-04 DIAGNOSIS — C49A5 Gastrointestinal stromal tumor of rectum: Secondary | ICD-10-CM | POA: Diagnosis not present

## 2020-07-04 DIAGNOSIS — K5909 Other constipation: Secondary | ICD-10-CM | POA: Diagnosis not present

## 2020-07-04 DIAGNOSIS — Z86010 Personal history of colonic polyps: Secondary | ICD-10-CM | POA: Diagnosis not present

## 2020-07-04 DIAGNOSIS — C3492 Malignant neoplasm of unspecified part of left bronchus or lung: Secondary | ICD-10-CM | POA: Diagnosis not present

## 2020-07-05 ENCOUNTER — Other Ambulatory Visit (HOSPITAL_COMMUNITY): Payer: Self-pay | Admitting: Surgery

## 2020-07-05 ENCOUNTER — Other Ambulatory Visit: Payer: Self-pay | Admitting: Surgery

## 2020-07-05 ENCOUNTER — Telehealth: Payer: Self-pay | Admitting: Internal Medicine

## 2020-07-05 DIAGNOSIS — C49A5 Gastrointestinal stromal tumor of rectum: Secondary | ICD-10-CM

## 2020-07-05 NOTE — Telephone Encounter (Signed)
Scheduled per sch msg. Called and spoke with patient. Confirmed appt  

## 2020-07-06 ENCOUNTER — Other Ambulatory Visit: Payer: Self-pay

## 2020-07-06 NOTE — Progress Notes (Signed)
The proposed treatment discussed in conference is for discussion purposes only and is not a binding recommendation.  The patients have not been physically examined, or presented with their treatment options.  Therefore, final treatment plans cannot be decided.   

## 2020-07-12 ENCOUNTER — Other Ambulatory Visit: Payer: Self-pay

## 2020-07-12 ENCOUNTER — Ambulatory Visit (INDEPENDENT_AMBULATORY_CARE_PROVIDER_SITE_OTHER): Payer: Medicare Other

## 2020-07-12 DIAGNOSIS — Z0000 Encounter for general adult medical examination without abnormal findings: Secondary | ICD-10-CM

## 2020-07-12 NOTE — Progress Notes (Signed)
Virtual Visit via Telephone Note  I connected with  Jesse [name] on 07/12/20 at  8:45 AM EDT by telephone and verified that I am speaking with the correct person using two identifiers.  Medicare Annual Wellness visit completed telephonically due to Covid-19 pandemic.   Persons participating in this call: This Health Coach and this patient.   Location: Patient: Home Provider: Office   I discussed the limitations, risks, security and privacy concerns of performing an evaluation and management service by telephone and the availability of in person appointments. The patient expressed understanding and agreed to proceed.  Unable to perform video visit due to video visit attempted and failed and/or patient does not have video capability.   Some vital signs may be absent or patient reported.   Willette Brace, LPN    Subjective:   Jesse [name] is a 70 y.o. male who presents for Medicare Annual/Subsequent preventive examination.  Review of Systems     Cardiac Risk Factors include: advanced age (>31men, >23 women);hypertension;male gender;dyslipidemia     Objective:    There were no vitals filed for this visit. There is no height or weight on file to calculate BMI.  Advanced Directives 07/12/2020 04/07/2020 03/17/2020 02/25/2020 02/11/2020 02/05/2020 01/28/2020  Does Patient Have a Medical Advance Directive? Yes No No No No No No  Type of Advance Directive [place]  Does patient want to make changes to medical advance directive? - No - Patient declined No - Patient declined No - Patient declined No - Patient declined No - Patient declined No - Patient declined  Copy of [place] in Chart? No - copy requested - - - - No - copy requested -  Would patient like information on creating a medical advance directive? - - - No - Patient declined - No - Patient declined -    Current Medications (verified) Outpatient Encounter  Medications as of 07/12/2020  Medication Sig  . atorvastatin (LIPITOR) 40 MG tablet Take 20 mg by mouth daily.  . diphenhydrAMINE (BENADRYL) 25 mg capsule Take 1-2 capsules (25-50 mg total) by mouth at bedtime as needed for sleep.  Marland Kitchen omeprazole (PRILOSEC) 40 MG capsule Take 1 capsule (40 mg total) by mouth daily. (Patient taking differently: Take 40 mg by mouth daily before breakfast.)  . amLODipine (NORVASC) 5 MG tablet Take 5 mg by mouth daily.  . [DISCONTINUED] dexamethasone (DECADRON) 4 MG tablet 1 tablet p.o. twice daily the day before, day of and day after chemotherapy every 3 weeks.  . [DISCONTINUED] folic acid (FOLVITE) 1 MG tablet Take 1 tablet (1 mg total) by mouth daily.  . [DISCONTINUED] magnesium oxide (MAG-OX) 400 MG tablet TAKE 1 TABLET BY MOUTH EVERY DAY   No facility-administered encounter medications on file as of 07/12/2020.    Allergies (verified) Patient has no known allergies.   History: Past Medical History:  Diagnosis Date  . Anemia   . Cancer [facility])    bladder cancer  . COPD (chronic obstructive pulmonary disease) (HCC)    mild   . DIVERTICULOSIS, COLON 01/14/2007   Qualifier: Diagnosis of  By: Paulina Fusi RN, [name]   . GASTRITIS, CHRONIC 06/01/2004   Qualifier: Diagnosis of  By: Nolon Rod CMA (AAMA), Robin    . GERD 01/09/2007   Qualifier: Diagnosis of  By: Scherrie Gerlach    . Heart murmur    as a child; no murmur heard 12/14/19  . HYPERLIPIDEMIA 01/09/2007  Qualifier: Diagnosis of  By: Scherrie Gerlach    . Hypertension   . Lung mass    left upper nodule  . Macular degeneration    right eye  . NEOPLASM, MALIGNANT, BLADDER, HX OF 2002   Qualifier: Diagnosis of  By: Nolon Rod CMA (AAMA), Robin  / no chemo or radiation  . OSTEOARTHRITIS 01/14/2007   Qualifier: Diagnosis of  By: Paulina Fusi RN, [name] - knees  . Rectal mass   . Reflux esophagitis 06/01/2004   Qualifier: Diagnosis of  By: Nolon Rod CMA (AAMA), Robin    . TOBACCO USER 02/16/2009   Qualifier:  Diagnosis of  By: Burnice Logan  MD, Doretha Sou   . VITAMIN D DEFICIENCY 06/03/2007   Qualifier: Diagnosis of  By: Burnice Logan  MD, Doretha Sou    Past Surgical History:  Procedure Laterality Date  . BRONCHIAL BIOPSY  10/13/2019   Procedure: BRONCHIAL BIOPSIES;  Surgeon: Garner Nash, DO;  Location: [facility];  Service: Pulmonary;;  . BRONCHIAL BRUSHINGS  10/13/2019   Procedure: BRONCHIAL BRUSHINGS;  Surgeon: Garner Nash, DO;  Location: [facility];  Service: Pulmonary;;  . BRONCHIAL NEEDLE ASPIRATION BIOPSY  10/13/2019   Procedure: BRONCHIAL NEEDLE ASPIRATION BIOPSIES;  Surgeon: Garner Nash, DO;  Location: [facility];  Service: Pulmonary;;  . BRONCHIAL WASHINGS  10/13/2019   Procedure: BRONCHIAL WASHINGS;  Surgeon: Garner Nash, DO;  Location: [facility];  Service: Pulmonary;;  . COLONOSCOPY     multiple  . INTERCOSTAL NERVE BLOCK Left 12/16/2019   Procedure: INTERCOSTAL NERVE BLOCK;  Surgeon: Lajuana Matte, MD;  Location: [place];  Service: Thoracic;  Laterality: Left;  . KNEE SURGERY  07/20/2008   bilat.  Marland Kitchen NASAL SEPTUM SURGERY  1995  . NODE DISSECTION Left 12/16/2019   Procedure: NODE DISSECTION;  Surgeon: Lajuana Matte, MD;  Location: [place];  Service: Thoracic;  Laterality: Left;  . TONSILLECTOMY    . TRANSURETHRAL RESECTION OF BLADDER TUMOR  2002  . UPPER GASTROINTESTINAL ENDOSCOPY    . VIDEO BRONCHOSCOPY WITH ENDOBRONCHIAL NAVIGATION N/A 10/13/2019   Procedure: VIDEO BRONCHOSCOPY WITH ENDOBRONCHIAL NAVIGATION;  Surgeon: Garner Nash, DO;  Location: [place];  Service: Pulmonary;  Laterality: N/A;  . WISDOM TOOTH EXTRACTION     Family History  Problem Relation Age of Onset  . Dementia Mother   . Diabetes Father   . Mental illness Father   . Peripheral vascular disease Brother    Social History   Socioeconomic History  . Marital status: Married    Spouse name: Not on file  . Number of children: 3  . Years of education: Not on file  . Highest  education level: Not on file  Occupational History    Comment: semi retired   Tobacco Use  . Smoking status: Former Smoker    Packs/day: 0.50    Years: 40.00    Pack years: 20.00    Types: Cigarettes    Quit date: 09/21/2019    Years since quitting: 0.8  . Smokeless tobacco: Never Used  Vaping Use  . Vaping Use: Never used  Substance and Sexual Activity  . Alcohol use: Yes    Alcohol/week: 15.0 standard drinks    Types: 15 Standard drinks or equivalent per week    Comment: wine/beer/martini  . Drug use: Yes    Types: Marijuana    Comment: Smokes marijuana occasional, last use in 08/2019  . Sexual activity: Not on file  Other Topics Concern  . 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 Difficulty of Paying Living Expenses: Not hard at all  Food Insecurity: No Food Insecurity  . Worried About Charity fundraiser in the Last Year: Never true  . Ran Out of Food in the Last Year: Never true  Transportation Needs: No Transportation Needs  . Lack of Transportation (Medical): No  . Lack of Transportation (Non-Medical): No  Physical Activity: Sufficiently Active  . Days of Exercise per Week: 7 days  . Minutes of Exercise per Session: 60 min  Stress: No Stress Concern Present  . Feeling of Stress : Not at all  Social Connections: Moderately Isolated  . Frequency of Communication with Friends and Family: More than three times a week  . Frequency of Social Gatherings with Friends and Family: More than three times a week  . Attends Religious Services: Never  . Active Member of Clubs or Organizations: No  . Attends Archivist Meetings: Never  . Marital Status: Married    Tobacco Counseling Counseling given: Not Answered   Clinical Intake:  Pre-visit  preparation completed: Yes  Pain : No/denies pain     BMI - recorded: 25.62 Nutritional Status: BMI 25 -29 Overweight Nutritional Risks: None Diabetes: No  How often do you need to have someone help you when you read instructions, pamphlets, or other written materials from your doctor or pharmacy?: 1 - Never  Diabetic?No  Interpreter Needed?: No  Information entered by :: Charlott Rakes, LPN   Activities of Daily Living In your present state of health, do you have any difficulty performing the following activities: 07/12/2020 12/14/2019  Hearing? N N  Vision? N Y  Comment - macular degeneration in right eye per patient  Difficulty concentrating or making decisions? Y N  Comment STM at times -  Walking or climbing stairs? N N  Dressing or bathing? N N  Doing errands, shopping? N N  Preparing Food and eating ? N -  Using the Toilet? N -  In the past six months, have you accidently leaked urine? N -  Do you have problems with loss of bowel control? N -  Managing your Medications? N -  Managing your Finances? N -  Housekeeping or managing your Housekeeping? N -  Some recent data might be hidden    Patient Care Team: [name], MD as PCP - General (Family Medicine) Curt Bears, MD as Consulting Physician (Oncology)  Indicate any recent Medical Services you may have received from other than [facility] providers in the past year (date may be approximate).     Assessment:   This is a routine wellness examination for [place].  Hearing/Vision screen  Hearing Screening   125Hz  250Hz  500Hz  1000Hz  2000Hz  3000Hz  4000Hz  6000Hz  8000Hz   Right ear:           Left ear:           Comments: Pt denies any hearing issues    Vision Screening Comments: Pt follows a retina specialist and Dr Marica Otter for eye exams   Dietary issues and exercise activities discussed: Current Exercise Habits: Home exercise routine, Type of exercise: walking (golf, walking and yard work), Time  (Minutes): > 60, Frequency (Times/Week): 7, Weekly Exercise (Minutes/Week): 0  Goals    . Patient Stated     Keep the 20lbs off    . Quit Smoking     "I'm gradually cutting down but I have a very hard time with quitting"      Depression Screen PHQ 2/9 Scores 07/12/2020 06/03/2019 06/03/2019 10/08/2017 06/03/2015 01/05/2014  PHQ - 2 Score 0 0 0 0 0 0    Fall Risk Fall Risk  07/12/2020 06/03/2019 10/08/2017 06/03/2015  Falls in the past year? 0 0 No No  Number falls in past yr: 0 - - -  Injury with Fall? 0 - - -  Risk for fall due to : Impaired vision Medication side effect - -  Follow up Falls prevention discussed Falls evaluation completed;Education provided;Falls prevention discussed - -    FALL RISK PREVENTION PERTAINING TO THE HOME:  Any stairs in or around the home? Yes  If so, are there any without handrails? No  Home free of loose throw rugs in walkways, pet beds, electrical cords, etc? Yes  Adequate lighting in your home to reduce risk of falls? Yes   ASSISTIVE DEVICES UTILIZED TO PREVENT FALLS:  Life alert? No  Use of a cane, walker or w/c? No  Grab bars in the bathroom? Yes  Shower chair or bench in shower? Yes  Elevated toilet seat or a handicapped toilet? No   TIMED UP AND GO:  Was the test performed? No .      Cognitive Function:     6CIT Screen 07/12/2020 06/03/2019  What Year? 0 points 0 points  What month? 0 points 0 points  What time? - 0 points  Count back from 20 0 points 0 points  Months in reverse 0 points 0 points  Repeat phrase 0 points 0 points  Total Score - 0    Immunizations Immunization History  Administered Date(s) Administered  . Fluad Quad(high Dose 65+) 01/16/2019, 02/25/2020  . PFIZER(Purple Top)SARS-COV-2 Vaccination 06/07/2019, 06/30/2019, 03/17/2020  . Pneumococcal Conjugate-13 06/08/2015  . Pneumococcal Polysaccharide-23 09/28/2016  . Tdap 10/07/2012    TDAP status: Up to date  Flu Vaccine status: Up to  date  Pneumococcal vaccine status: Up to date  Covid-19 vaccine status: Completed vaccines  Qualifies for Shingles Vaccine? Yes   Zostavax completed No   Shingrix Completed?: No.    Education has been provided regarding the importance of this vaccine. Patient has been advised to call insurance company to determine out of pocket expense if they have not yet received this vaccine. Advised may also receive vaccine at local pharmacy or Health Dept. Verbalized acceptance and understanding.  Screening Tests Health Maintenance  Topic Date Due  . COVID-19 Vaccine (4 - Booster for Pfizer series) 09/15/2020  . TETANUS/TDAP  10/08/2022  . INFLUENZA VACCINE  Completed  . Hepatitis C Screening  Completed  . PNA vac Low Risk Adult  Completed  . HPV VACCINES  Aged Out    Health Maintenance  There are no preventive care reminders to display for this patient.  Colorectal cancer screening: No longer required.   Additional Screening:  Hepatitis C Screening:  Completed 09/28/16  Vision Screening: Recommended annual ophthalmology exams for early detection of glaucoma and other disorders of the eye. Is the patient up to date with their annual eye exam?  Yes  Who is the provider or what is the name of the office in which the patient attends annual eye exams? Dr Marica Otter  If pt is not established with a provider, would they  like to be referred to a provider to establish care? No .   Dental Screening: Recommended annual dental exams for proper oral hygiene  Community Resource Referral / Chronic Care Management: CRR required this visit?  No   CCM required this visit?  No      Plan:     I have personally reviewed and noted the following in the patient's chart:   . Medical and social history . Use of alcohol, tobacco or illicit drugs  . Current medications and supplements . Functional ability and status . Nutritional status . Physical activity . Advanced directives . List of other  physicians . Hospitalizations, surgeries, and ER visits in previous 12 months . Vitals . Screenings to include cognitive, depression, and falls . Referrals and appointments  In addition, I have reviewed and discussed with patient certain preventive protocols, quality metrics, and best practice recommendations. A written personalized care plan for preventive services as well as general preventive health recommendations were provided to patient.     Willette Brace, LPN   [id number]   Nurse Notes: None

## 2020-07-12 NOTE — Patient Instructions (Signed)
Jesse [name] , Thank you for taking time to come for your Medicare Wellness Visit. I appreciate your ongoing commitment to your health goals. Please review the following plan we discussed and let me know if I can assist you in the future.   Screening recommendations/referrals: Colonoscopy: Done 10/21/19 Recommended yearly ophthalmology/optometry visit for glaucoma screening and checkup Recommended yearly dental visit for hygiene and checkup  Vaccinations: Influenza vaccine: Up to date Pneumococcal vaccine: Up to date Tdap vaccine: Up to date Shingles vaccine: Shingrix discussed. Please contact your pharmacy for coverage information.    Covid-19: Completed 2/21, 3/16, & 03/17/20  Advanced directives: Please bring a copy of your health care power of attorney and living will to the office at your convenience.  Conditions/risks identified: Completed 2/21, 3/16, 03/17/20  Next appointment: Follow up in one year for your annual wellness visit.   Preventive Care 71 Years and Older, Male Preventive care refers to lifestyle choices and visits with your health care provider that can promote health and wellness. What does preventive care include?  A yearly physical exam. This is also called an annual well check.  Dental exams once or twice a year.  Routine eye exams. Ask your health care provider how often you should have your eyes checked.  Personal lifestyle choices, including:  Daily care of your teeth and gums.  Regular physical activity.  Eating a healthy diet.  Avoiding tobacco and drug use.  Limiting alcohol use.  Practicing safe sex.  Taking low doses of aspirin every day.  Taking vitamin and mineral supplements as recommended by your health care provider. What happens during an annual well check? The services and screenings done by your health care provider during your annual well check will depend on your age, overall health, lifestyle risk factors, and family history of  disease. Counseling  Your health care provider may ask you questions about your:  Alcohol use.  Tobacco use.  Drug use.  Emotional well-being.  Home and relationship well-being.  Sexual activity.  Eating habits.  History of falls.  Memory and ability to understand (cognition).  Work and work Statistician. Screening  You may have the following tests or measurements:  Height, weight, and BMI.  Blood pressure.  Lipid and cholesterol levels. These may be checked every 5 years, or more frequently if you are over 24 years old.  Skin check.  Lung cancer screening. You may have this screening every year starting at age 57 if you have a 30-pack-year history of smoking and currently smoke or have quit within the past 15 years.  Fecal occult blood test (FOBT) of the stool. You may have this test every year starting at age 54.  Flexible sigmoidoscopy or colonoscopy. You may have a sigmoidoscopy every 5 years or a colonoscopy every 10 years starting at age 30.  Prostate cancer screening. Recommendations will vary depending on your family history and other risks.  Hepatitis C blood test.  Hepatitis B blood test.  Sexually transmitted disease (STD) testing.  Diabetes screening. This is done by checking your blood sugar (glucose) after you have not eaten for a while (fasting). You may have this done every 1-3 years.  Abdominal aortic aneurysm (AAA) screening. You may need this if you are a current or former smoker.  Osteoporosis. You may be screened starting at age 37 if you are at high risk. Talk with your health care provider about your test results, treatment options, and if necessary, the need for more tests. Vaccines  Your health care provider may recommend certain vaccines, such as:  Influenza vaccine. This is recommended every year.  Tetanus, diphtheria, and acellular pertussis (Tdap, Td) vaccine. You may need a Td booster every 10 years.  Zoster vaccine. You may  need this after age 1.  Pneumococcal 13-valent conjugate (PCV13) vaccine. One dose is recommended after age 65.  Pneumococcal polysaccharide (PPSV23) vaccine. One dose is recommended after age 45. Talk to your health care provider about which screenings and vaccines you need and how often you need them. This information is not intended to replace advice given to you by your health care provider. Make sure you discuss any questions you have with your health care provider. Document Released: 04/29/2015 Document Revised: 12/21/2015 Document Reviewed: 02/01/2015 Elsevier Interactive Patient Education  2017 [employer] Prevention in the Home Falls can cause injuries. They can happen to people of all ages. There are many things you can do to make your home safe and to help prevent falls. What can I do on the outside of my home?  Regularly fix the edges of walkways and driveways and fix any cracks.  Remove anything that might make you trip as you walk through a door, such as a raised step or threshold.  Trim any bushes or trees on the path to your home.  Use bright outdoor lighting.  Clear any walking paths of anything that might make someone trip, such as rocks or tools.  Regularly check to see if handrails are loose or broken. Make sure that both sides of any steps have handrails.  Any raised decks and porches should have guardrails on the edges.  Have any leaves, snow, or ice cleared regularly.  Use sand or salt on walking paths during winter.  Clean up any spills in your garage right away. This includes oil or grease spills. What can I do in the bathroom?  Use night lights.  Install grab bars by the toilet and in the tub and shower. Do not use towel bars as grab bars.  Use non-skid mats or decals in the tub or shower.  If you need to sit down in the shower, use a plastic, non-slip stool.  Keep the floor dry. Clean up any water that spills on the floor as soon as it  happens.  Remove soap buildup in the tub or shower regularly.  Attach bath mats securely with double-sided non-slip rug tape.  Do not have throw rugs and other things on the floor that can make you trip. What can I do in the bedroom?  Use night lights.  Make sure that you have a light by your bed that is easy to reach.  Do not use any sheets or blankets that are too big for your bed. They should not hang down onto the floor.  Have a firm chair that has side arms. You can use this for support while you get dressed.  Do not have throw rugs and other things on the floor that can make you trip. What can I do in the kitchen?  Clean up any spills right away.  Avoid walking on wet floors.  Keep items that you use a lot in easy-to-reach places.  If you need to reach something above you, use a strong step stool that has a grab bar.  Keep electrical cords out of the way.  Do not use floor polish or wax that makes floors slippery. If you must use wax, use non-skid floor wax.  Do  not have throw rugs and other things on the floor that can make you trip. What can I do with my stairs?  Do not leave any items on the stairs.  Make sure that there are handrails on both sides of the stairs and use them. Fix handrails that are broken or loose. Make sure that handrails are as long as the stairways.  Check any carpeting to make sure that it is firmly attached to the stairs. Fix any carpet that is loose or worn.  Avoid having throw rugs at the top or bottom of the stairs. If you do have throw rugs, attach them to the floor with carpet tape.  Make sure that you have a light switch at the top of the stairs and the bottom of the stairs. If you do not have them, ask someone to add them for you. What else can I do to help prevent falls?  Wear shoes that:  Do not have high heels.  Have rubber bottoms.  Are comfortable and fit you well.  Are closed at the toe. Do not wear sandals.  If you  use a stepladder:  Make sure that it is fully opened. Do not climb a closed stepladder.  Make sure that both sides of the stepladder are locked into place.  Ask someone to hold it for you, if possible.  Clearly mark and make sure that you can see:  Any grab bars or handrails.  First and last steps.  Where the edge of each step is.  Use tools that help you move around (mobility aids) if they are needed. These include:  Canes.  Walkers.  Scooters.  Crutches.  Turn on the lights when you go into a dark area. Replace any light bulbs as soon as they burn out.  Set up your furniture so you have a clear path. Avoid moving your furniture around.  If any of your floors are uneven, fix them.  If there are any pets around you, be aware of where they are.  Review your medicines with your doctor. Some medicines can make you feel dizzy. This can increase your chance of falling. Ask your doctor what other things that you can do to help prevent falls. This information is not intended to replace advice given to you by your health care provider. Make sure you discuss any questions you have with your health care provider. Document Released: 01/27/2009 Document Revised: 09/08/2015 Document Reviewed: 05/07/2014 Elsevier Interactive Patient Education  2017 [employer].

## 2020-07-14 ENCOUNTER — Ambulatory Visit (HOSPITAL_COMMUNITY)
Admission: RE | Admit: 2020-07-14 | Discharge: 2020-07-14 | Disposition: A | Discharge status: Home / Self Care | Payer: Medicare Other | Source: Ambulatory Visit | Attending: Surgery | Admitting: Surgery

## 2020-07-14 ENCOUNTER — Other Ambulatory Visit: Payer: Self-pay

## 2020-07-14 DIAGNOSIS — C49A5 Gastrointestinal stromal tumor of rectum: Secondary | ICD-10-CM | POA: Insufficient documentation

## 2020-07-14 DIAGNOSIS — K6389 Other specified diseases of intestine: Secondary | ICD-10-CM | POA: Diagnosis not present

## 2020-07-14 DIAGNOSIS — C49A0 Gastrointestinal stromal tumor, unspecified site: Secondary | ICD-10-CM | POA: Diagnosis not present

## 2020-07-14 DIAGNOSIS — K5730 Diverticulosis of large intestine without perforation or abscess without bleeding: Secondary | ICD-10-CM | POA: Diagnosis not present

## 2020-07-14 DIAGNOSIS — K6289 Other specified diseases of anus and rectum: Secondary | ICD-10-CM | POA: Diagnosis not present

## 2020-07-14 MED ORDER — GADOBUTROL 1 MMOL/ML IV SOLN
7.0000 mL | Freq: Once | INTRAVENOUS | Status: AC | PRN
  Administered 2020-07-14: 7 mL via INTRAVENOUS

## 2020-07-18 ENCOUNTER — Encounter: Payer: Self-pay | Admitting: Internal Medicine

## 2020-07-18 ENCOUNTER — Other Ambulatory Visit: Payer: Self-pay | Admitting: Medical Oncology

## 2020-07-18 ENCOUNTER — Inpatient Hospital Stay: Payer: Medicare Other

## 2020-07-18 ENCOUNTER — Inpatient Hospital Stay: Payer: Medicare Other | Attending: Internal Medicine | Admitting: Internal Medicine

## 2020-07-18 ENCOUNTER — Other Ambulatory Visit (HOSPITAL_COMMUNITY): Payer: Self-pay

## 2020-07-18 ENCOUNTER — Telehealth (HOSPITAL_COMMUNITY): Payer: Self-pay

## 2020-07-18 ENCOUNTER — Telehealth: Payer: Self-pay | Admitting: Pharmacist

## 2020-07-18 ENCOUNTER — Other Ambulatory Visit: Payer: Self-pay

## 2020-07-18 VITALS — BP 144/88 | HR 59 | Temp 96.6°F | Resp 18 | Ht 69.0 in | Wt 175.4 lb

## 2020-07-18 DIAGNOSIS — C49A9 Gastrointestinal stromal tumor of other sites: Secondary | ICD-10-CM | POA: Diagnosis not present

## 2020-07-18 DIAGNOSIS — C3412 Malignant neoplasm of upper lobe, left bronchus or lung: Secondary | ICD-10-CM | POA: Insufficient documentation

## 2020-07-18 DIAGNOSIS — C3490 Malignant neoplasm of unspecified part of unspecified bronchus or lung: Secondary | ICD-10-CM

## 2020-07-18 DIAGNOSIS — Z902 Acquired absence of lung [part of]: Secondary | ICD-10-CM | POA: Insufficient documentation

## 2020-07-18 DIAGNOSIS — Z9221 Personal history of antineoplastic chemotherapy: Secondary | ICD-10-CM | POA: Diagnosis not present

## 2020-07-18 DIAGNOSIS — C49A0 Gastrointestinal stromal tumor, unspecified site: Secondary | ICD-10-CM | POA: Insufficient documentation

## 2020-07-18 DIAGNOSIS — C49A5 Gastrointestinal stromal tumor of rectum: Secondary | ICD-10-CM | POA: Diagnosis not present

## 2020-07-18 DIAGNOSIS — I10 Essential (primary) hypertension: Secondary | ICD-10-CM | POA: Diagnosis not present

## 2020-07-18 DIAGNOSIS — Z5111 Encounter for antineoplastic chemotherapy: Secondary | ICD-10-CM | POA: Diagnosis not present

## 2020-07-18 DIAGNOSIS — Z79899 Other long term (current) drug therapy: Secondary | ICD-10-CM | POA: Diagnosis not present

## 2020-07-18 DIAGNOSIS — J449 Chronic obstructive pulmonary disease, unspecified: Secondary | ICD-10-CM | POA: Insufficient documentation

## 2020-07-18 DIAGNOSIS — Z8551 Personal history of malignant neoplasm of bladder: Secondary | ICD-10-CM | POA: Insufficient documentation

## 2020-07-18 LAB — CMP (CANCER CENTER ONLY)
ALT: 15 U/L (ref 0–44)
AST: 22 U/L (ref 15–41)
Albumin: 4 g/dL (ref 3.5–5.0)
Alkaline Phosphatase: 82 U/L (ref 38–126)
Anion gap: 11 (ref 5–15)
BUN: 27 mg/dL — ABNORMAL HIGH (ref 8–23)
CO2: 25 mmol/L (ref 22–32)
Calcium: 9.2 mg/dL (ref 8.9–10.3)
Chloride: 107 mmol/L (ref 98–111)
Creatinine: 0.84 mg/dL (ref 0.61–1.24)
GFR, Estimated: >60 mL/min (ref >60)
Glucose, Bld: 107 mg/dL — ABNORMAL HIGH (ref 70–99)
Potassium: 3.9 mmol/L (ref 3.5–5.1)
Sodium: 143 mmol/L (ref 135–145)
Total Bilirubin: 0.6 mg/dL (ref 0.3–1.2)
Total Protein: 7 g/dL (ref 6.5–8.1)

## 2020-07-18 LAB — CBC WITH DIFFERENTIAL (CANCER CENTER ONLY)
Abs Immature Granulocytes: 0.01 10*3/uL (ref 0.00–0.07)
Basophils Absolute: 0 10*3/uL (ref 0.0–0.1)
Basophils Relative: 0 %
Eosinophils Absolute: 0.1 10*3/uL (ref 0.0–0.5)
Eosinophils Relative: 2 %
HCT: 37.4 % — ABNORMAL LOW (ref 39.0–52.0)
Hemoglobin: 12.9 g/dL — ABNORMAL LOW (ref 13.0–17.0)
Immature Granulocytes: 0 %
Lymphocytes Relative: 31 %
Lymphs Abs: 1.6 10*3/uL (ref 0.7–4.0)
MCH: 32.9 pg (ref 26.0–34.0)
MCHC: 34.5 g/dL (ref 30.0–36.0)
MCV: 95.4 fL (ref 80.0–100.0)
Monocytes Absolute: 0.3 10*3/uL (ref 0.1–1.0)
Monocytes Relative: 6 %
Neutro Abs: 3.1 10*3/uL (ref 1.7–7.7)
Neutrophils Relative %: 61 %
Platelet Count: 141 10*3/uL — ABNORMAL LOW (ref 150–400)
RBC: 3.92 MIL/uL — ABNORMAL LOW (ref 4.22–5.81)
RDW: 12.4 % (ref 11.5–15.5)
WBC Count: 5.1 10*3/uL (ref 4.0–10.5)
nRBC: 0 % (ref 0.0–0.2)

## 2020-07-18 LAB — MAGNESIUM: Magnesium: 1.7 mg/dL (ref 1.7–2.4)

## 2020-07-18 MED ORDER — IMATINIB MESYLATE 400 MG PO TABS: 400.0000 mg | ORAL_TABLET | Freq: Every day | ORAL | 3 refills | Status: DC

## 2020-07-18 MED ORDER — IMATINIB MESYLATE 400 MG PO TABS
400.0000 mg | ORAL_TABLET | Freq: Every day | ORAL | 3 refills | Status: DC
  Filled 2020-07-18 – 2020-07-19 (×2): qty 30, 30d supply, fill #0

## 2020-07-18 NOTE — Telephone Encounter (Signed)
Oral Oncology Patient Advocate Encounter  Received notification from [facility] that prior authorization for [place] is required.  PA submitted on CoverMyMeds Key [id number] Status is pending  Oral Oncology Clinic will continue to follow.  [place] Patient [place] Phone [phone] Fax [phone] 07/18/2020 9:24 AM

## 2020-07-18 NOTE — Progress Notes (Signed)
START OFF PATHWAY REGIMEN - Other   [id number]:Imatinib 400 mg PO Daily D1-28 q28 Days:   A cycle is every 28 days:     Imatinib   **Always confirm dose/schedule in your pharmacy ordering system**  Patient Characteristics: Intent of Therapy: Curative Intent, Discussed with Patient

## 2020-07-18 NOTE — Progress Notes (Signed)
[facility] [phone]   [phone]  OFFICE PROGRESS NOTE  Jesse Ruddy, MD [place] [address]  DIAGNOSIS:  1) Stage IIB (T1 a, N1, M0) non-small cell lung cancer, adenocarcinoma diagnosed in June 2021. 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  Biomarker Findings Tumor Mutational Burden - 20 Muts/Mb Microsatellite status - MS-Stable Genomic Findings For a complete list of the genes assayed, please refer to the Appendix. KEAP1 I432f*17 [id number] RB1 splice site [id number]>[id number]* 8 Disease relevant genes with no reportable alterations: ALK, BRAF, EGFR, ERBB2, KRAS, MET, RET, ROS1  PDL1 Expression: 1 %  PRIOR THERAPY:  1) status post left upper lobectomy with lymph node dissection on December 16, 2019 under the care of Dr. LKipp Brood  The tumor size measured 0.9 cm but there was involvement of the level 10 L and 11 L.  2) Adjuvant systemic chemotherapy with cisplatin 75 mg/M2 and Alimta 500 mg/M2 every 3 weeks.  First dose February 04, 2020.  Status post 4 cycles.  CURRENT THERAPY: Neoadjuvant treatment with imatinib 400 mg p.o. daily for the GIST tumor of the pelvic area   INTERVAL HISTORY: Jesse [name] 70y.o. male returns to the clinic today for follow-up visit.  The patient is feeling fine today with no concerning complaints.  He started having more energy than before.  He denied having any current chest pain, shortness of breath, cough or hemoptysis.  He denied having any fever or chills.  He has no nausea, vomiting, diarrhea or constipation.  He has no headache or visual changes.  He was seen recently by Dr. GJohney Mainefor evaluation of the GIST tumor of the pelvic area.  He recommended for him to start neoadjuvant treatment with imatinib before consideration of the surgical resection.  The patient is here today for evaluation and discussion of this option.   MEDICAL HISTORY: Past Medical  History:  Diagnosis Date  . Anemia   . Cancer ([facility]    bladder cancer  . COPD (chronic obstructive pulmonary disease) (HCC)    mild   . DIVERTICULOSIS, COLON 01/14/2007   Qualifier: Diagnosis of  By: MPaulina FusiRN, JDaine Gravel  . GASTRITIS, CHRONIC 06/01/2004   Qualifier: Diagnosis of  By: SNolon RodCMA (AAMA), Robin    . GERD 01/09/2007   Qualifier: Diagnosis of  By: TScherrie Gerlach   . Heart murmur    as a child; no murmur heard 12/14/19  . HYPERLIPIDEMIA 01/09/2007   Qualifier: Diagnosis of  By: TScherrie Gerlach   . Hypertension   . Lung mass    left upper nodule  . Macular degeneration    right eye  . NEOPLASM, MALIGNANT, BLADDER, HX OF 2002   Qualifier: Diagnosis of  By: SNolon RodCMA (AAMA), Robin  / no chemo or radiation  . OSTEOARTHRITIS 01/14/2007   Qualifier: Diagnosis of  By: MPaulina FusiRN, JDaine Gravel- knees  . Rectal mass   . Reflux esophagitis 06/01/2004   Qualifier: Diagnosis of  By: SNolon RodCMA (AAMA), Robin    . TOBACCO USER 02/16/2009   Qualifier: Diagnosis of  By: KBurnice Logan MD, PDoretha Sou  . VITAMIN D DEFICIENCY 06/03/2007   Qualifier: Diagnosis of  By: KBurnice Logan MD, PDoretha Sou    ALLERGIES:  has No Known Allergies.  MEDICATIONS:  Current Outpatient Medications  Medication Sig Dispense Refill  . amLODipine (NORVASC) 5 MG tablet Take 5  mg by mouth daily.    Marland Kitchen atorvastatin (LIPITOR) 40 MG tablet Take 20 mg by mouth daily.    . diphenhydrAMINE (BENADRYL) 25 mg capsule Take 1-2 capsules (25-50 mg total) by mouth at bedtime as needed for sleep. 30 capsule 0  . omeprazole (PRILOSEC) 40 MG capsule Take 1 capsule (40 mg total) by mouth daily. (Patient taking differently: Take 40 mg by mouth daily before breakfast.) 90 capsule 3   No current facility-administered medications for this visit.    SURGICAL HISTORY:  Past Surgical History:  Procedure Laterality Date  . BRONCHIAL BIOPSY  10/13/2019   Procedure: BRONCHIAL BIOPSIES;  Surgeon: Garner Nash, DO;  Location: [facility]  ENDOSCOPY;  Service: Pulmonary;;  . BRONCHIAL BRUSHINGS  10/13/2019   Procedure: BRONCHIAL BRUSHINGS;  Surgeon: Garner Nash, DO;  Location: [place] ENDOSCOPY;  Service: Pulmonary;;  . BRONCHIAL NEEDLE ASPIRATION BIOPSY  10/13/2019   Procedure: BRONCHIAL NEEDLE ASPIRATION BIOPSIES;  Surgeon: Garner Nash, DO;  Location: [facility] ENDOSCOPY;  Service: Pulmonary;;  . BRONCHIAL WASHINGS  10/13/2019   Procedure: BRONCHIAL WASHINGS;  Surgeon: Garner Nash, DO;  Location: [facility] ENDOSCOPY;  Service: Pulmonary;;  . COLONOSCOPY     multiple  . INTERCOSTAL NERVE BLOCK Left 12/16/2019   Procedure: INTERCOSTAL NERVE BLOCK;  Surgeon: Lajuana Matte, MD;  Location: [facility];  Service: Thoracic;  Laterality: Left;  . KNEE SURGERY  07/20/2008   bilat.  Marland Kitchen NASAL SEPTUM SURGERY  1995  . NODE DISSECTION Left 12/16/2019   Procedure: NODE DISSECTION;  Surgeon: Lajuana Matte, MD;  Location: [facility];  Service: Thoracic;  Laterality: Left;  . TONSILLECTOMY    . TRANSURETHRAL RESECTION OF BLADDER TUMOR  2002  . UPPER GASTROINTESTINAL ENDOSCOPY    . VIDEO BRONCHOSCOPY WITH ENDOBRONCHIAL NAVIGATION N/A 10/13/2019   Procedure: VIDEO BRONCHOSCOPY WITH ENDOBRONCHIAL NAVIGATION;  Surgeon: Garner Nash, DO;  Location: [facility];  Service: Pulmonary;  Laterality: N/A;  . WISDOM TOOTH EXTRACTION      REVIEW OF SYSTEMS:  Constitutional: negative Eyes: negative Ears, nose, mouth, throat, and face: negative Respiratory: negative Cardiovascular: negative Gastrointestinal: negative Genitourinary:negative Integument/breast: negative Hematologic/lymphatic: negative Musculoskeletal:negative Neurological: negative Behavioral/Psych: negative Endocrine: negative Allergic/Immunologic: negative   PHYSICAL EXAMINATION: General appearance: alert, cooperative and no distress Head: Normocephalic, without obvious abnormality, atraumatic Neck: no adenopathy, no JVD, supple, symmetrical, trachea midline and thyroid not  enlarged, symmetric, no tenderness/mass/nodules Lymph nodes: Cervical, supraclavicular, and axillary nodes normal. Resp: clear to auscultation bilaterally Back: symmetric, no curvature. ROM normal. No CVA tenderness. Cardio: regular rate and rhythm, S1, S2 normal, no murmur, click, rub or gallop GI: soft, non-tender; bowel sounds normal; no masses,  no organomegaly Extremities: extremities normal, atraumatic, no cyanosis or edema Neurologic: Alert and oriented X 3, normal strength and tone. Normal symmetric reflexes. Normal coordination and gait  ECOG PERFORMANCE STATUS: 0 - Asymptomatic  Blood pressure (!) 144/88, pulse (!) 59, temperature (!) 96.6 F (35.9 C), temperature source Tympanic, resp. rate 18, height '5\' 9"'  (1.753 m), weight 175 lb 6.4 oz (79.6 kg), SpO2 100 %.  LABORATORY DATA: Lab Results  Component Value Date   WBC 5.1 07/18/2020   HGB 12.9 (L) 07/18/2020   HCT 37.4 (L) 07/18/2020   MCV 95.4 07/18/2020   PLT 141 (L) 07/18/2020      Chemistry      Component Value Date/Time   NA 140 04/28/2020 0920   K 4.4 04/28/2020 0920   CL 106 04/28/2020 0920   CO2 27 04/28/2020 0920  BUN 19 04/28/2020 0920   CREATININE 0.83 04/28/2020 0920      Component Value Date/Time   CALCIUM 9.6 04/28/2020 0920   ALKPHOS 84 04/28/2020 0920   AST 27 04/28/2020 0920   ALT 15 04/28/2020 0920   BILITOT 0.6 04/28/2020 0920       RADIOGRAPHIC STUDIES: MR PELVIS W WO CONTRAST  Result Date: 07/15/2020 CLINICAL DATA:  Known GIST tumor seen in June of 2021. History of LEFT upper lobectomy also by report. EXAM: MRI PELVIS WITHOUT AND WITH CONTRAST TECHNIQUE: Multiplanar multisequence MR imaging of the pelvis was performed both before and after administration of intravenous contrast. CONTRAST:  19m GADAVIST GADOBUTROL 1 MMOL/ML IV SOLN COMPARISON:  10/09/2019 FINDINGS: Urinary Tract: Urinary bladder is unremarkable. No distal ureteral dilation. Bowel: Large mass arises from the anterior rectum  measuring approximately 6.4 x 6.0 x 5.3 cm as compared to 5.1 x 4.0 by 4.2 cm. This displaces the prostate anteriorly abutting the prostate and seminal vesicles. Also abutting the pelvic floor. Mass displays profound restricted diffusion with suspected central necrosis. Colonic diverticulosis. Vascular/Lymphatic: Vascular structures not well assessed on limited pelvic evaluation no signs of adenopathy on limited field of view for rectal assessment. Reproductive: Prostate displaced by large tumor lack of fat plane between prostate and seminal vesicles but with more suggested in of pushing margins and with a tumor that appears encapsulated. Tumor abuts the pelvic floor as described. Other:  No ascites Musculoskeletal: Heterogeneous marrow signal is demonstrated on today's scan. There is a focal lesion with signal that matches that of the periphery of the primary tumor in the pelvis. This focal lesion is centered in the posterior vertebral body of L5 (image 17 of series 4) this measures approximately the 1.3 cm. IMPRESSION: 1. Large mass that is arising from the anterior rectum compatible with known GIST tumor with interval enlargement. This displaces the prostate anteriorly abutting the prostate and seminal vesicles. Tumor abuts also the pelvic floor. Findings suspicious for bony metastatic disease involving L5 as discussed. No visible lesion seen in this location on previous CT of the abdomen and pelvis. PET scan may be helpful for further evaluation or dedicated abdomen and pelvis CT as warranted for further assessment. 2. No signs of nodal disease on limited evaluation given coverage focused on rectal primary. 3. Colonic diverticulosis. These results will be called to the ordering clinician or representative by the Radiologist Assistant, and communication documented in the PACS or [employer] Electronically Signed   By: GZetta BillsM.D.   On: 07/15/2020 09:42    ASSESSMENT AND PLAN: This is a very  pleasant 70years old white male recently diagnosed with a stage IIb (T1 a, N1, M0) non-small cell lung cancer, adenocarcinoma in June 2021 status post left upper lobectomy with lymph node dissection on December 16, 2019 under the care of Dr. LKipp Brood  The patient has no actionable mutations and PD-L1 expression was 1%. He was also diagnosed with pelvic GIST tumor. The patient completed a course of adjuvant systemic chemotherapy with cisplatin 75 mg/M2 and Alimta 500 mg/M2 every 3 weeks status post 4 cycles.  He tolerated his treatment well except for fatigue and occasional nausea. He is currently on observation for the lung cancer. For the pelvic GIST tumor, I discussed with the patient the option of neoadjuvant treatment with imatinib as well as the adverse effect of this treatment. I will start him on imatinib 400 mg p.o. daily. I will see the patient back for follow-up visit  in 1 months for evaluation and repeat blood work. For the hypertension, the patient was advised to continue his treatment with Norvasc and to discuss with his primary care physician for adjustment of his medication. He was advised to call immediately if he has any other concerning symptoms in the interval. The patient voices understanding of current disease status and treatment options and is in agreement with the current care plan.  All questions were answered. The patient knows to call the clinic with any problems, questions or concerns. We can certainly see the patient much sooner if necessary.   Disclaimer: This note was dictated with voice recognition software. Similar sounding words can inadvertently be transcribed and may not be corrected upon review.

## 2020-07-18 NOTE — Telephone Encounter (Addendum)
Oral Chemotherapy Pharmacist Encounter  I met with patient in clinic for overview of [place] (imatinib) for the treatment of GIST, planned duration until disease progression or unacceptable drug toxicity.  Counseled patient on administration, dosing, side effects, monitoring, drug-food interactions, safe handling, storage, and disposal.  Patient will take Gleevec 465m tablets, 1 tablet (4068m by mouth once daily with a meal and a large glass of water.  Patient knows food may decrease stomach irritation and to maintain hydration while on treatment with Gleevec.  Patient counseled to avoid grapefruit and grapefruit juice.  Gleevec start date: 07/20/20  Adverse effects include but are not limited to: nausea, vomiting, diarrhea, fatigue, muscle cramps, lower extremity edema, rash, decreased blood counts, GI bleeding, and cardiac dysfunction.  Patient will obtain anti diarrheal and alert the office of 4 or more loose stools above baseline.  Reviewed with patient importance of keeping a medication schedule and plan for any missed doses. No barriers to medication adherence identified.  Medication reconciliation performed and medication/allergy list updated.  Insurance authorization for [place]'sas been obtained. Test claim at the pharmacy revealed copayment $161 for 1st fill of Gleevec. [facility]  Patient informed the pharmacy will reach out 5-7 days prior to needing next fill of Gleevec to coordinate continued medication acquisition to prevent break in therapy.  All questions answered.  Mr. TrLipfordoiced understanding and appreciation.   Medication education handout given to patient. Patient knows to call the office with questions or concerns. Oral Chemotherapy Clinic phone number provided to patient.   ReLeron CroakPharmD, BCPS Hematology/Oncology Clinical Pharmacist [phone]/07/2020 9:12 AM

## 2020-07-18 NOTE — Telephone Encounter (Signed)
Oral Oncology Pharmacist Encounter  Received new prescription for Gleevec (imatinib) for the treatment of GIST, planned duration until disease progression or unacceptable drug toxicity.  CBC w/ Diff and CMP from 07/18/20 assessed, labs stable for treatment initiation, no dose adjustments required.  Current medication list in Epic reviewed, DDIs with Gleevec identified:  Gleevec may increase serum concentrations of Amlodipine through CYP3A4 inhibition - recommend monitoring for increased SE from amlodipine including, but not limited to hypotension - noted patient's BP 144/88 mmHg on 07/18/20  Gleevec may also increase serum concentrations of Atorvastatin through CYP3A4 inhibition - recommend monitoring for increased SE from atorvastatin such as muscle aches/pains, LFT changes - no change in therapy warranted at this time.   Evaluated chart and no patient barriers to medication adherence noted.   Patient agreement for treatment documented in MD note on 07/18/20.  Prescription has been e-scribed to the [facility] for benefits analysis and approval.  Oral Oncology Clinic will continue to follow for insurance authorization, copayment issues, initial counseling and start date.  Leron Croak, PharmD, BCPS Hematology/Oncology Clinical Pharmacist [facility] [phone] 07/18/2020 9:06 AM

## 2020-07-18 NOTE — Telephone Encounter (Signed)
Oral Oncology Patient Advocate Encounter  Prior Authorization for [place] has been approved.    PA# [id number] Effective dates: 07/18/20 through 04/15/21  Patients co-pay is $161.11  Oral Oncology Clinic will continue to follow.   [place] Patient [place] Phone [phone] Fax [phone] 07/18/2020 9:31 AM

## 2020-07-19 ENCOUNTER — Other Ambulatory Visit (HOSPITAL_COMMUNITY): Payer: Self-pay

## 2020-07-20 ENCOUNTER — Telehealth: Payer: Self-pay | Admitting: Internal Medicine

## 2020-07-20 NOTE — Telephone Encounter (Signed)
Scheduled per los. Called and spoke with patient. Confirmed appt 

## 2020-08-10 ENCOUNTER — Other Ambulatory Visit (HOSPITAL_COMMUNITY): Payer: Self-pay

## 2020-08-12 ENCOUNTER — Other Ambulatory Visit (HOSPITAL_COMMUNITY): Payer: Self-pay

## 2020-08-15 ENCOUNTER — Inpatient Hospital Stay: Payer: Medicare Other

## 2020-08-15 ENCOUNTER — Inpatient Hospital Stay: Payer: Medicare Other | Attending: Internal Medicine | Admitting: Internal Medicine

## 2020-08-15 ENCOUNTER — Other Ambulatory Visit (HOSPITAL_COMMUNITY): Payer: Self-pay

## 2020-08-15 ENCOUNTER — Other Ambulatory Visit: Payer: Self-pay

## 2020-08-15 VITALS — BP 137/84 | HR 78 | Temp 97.0°F | Resp 19 | Ht 69.0 in | Wt 177.3 lb

## 2020-08-15 DIAGNOSIS — I10 Essential (primary) hypertension: Secondary | ICD-10-CM | POA: Diagnosis not present

## 2020-08-15 DIAGNOSIS — Z85118 Personal history of other malignant neoplasm of bronchus and lung: Secondary | ICD-10-CM | POA: Insufficient documentation

## 2020-08-15 DIAGNOSIS — Z902 Acquired absence of lung [part of]: Secondary | ICD-10-CM | POA: Insufficient documentation

## 2020-08-15 DIAGNOSIS — C3492 Malignant neoplasm of unspecified part of left bronchus or lung: Secondary | ICD-10-CM

## 2020-08-15 DIAGNOSIS — Z5111 Encounter for antineoplastic chemotherapy: Secondary | ICD-10-CM

## 2020-08-15 DIAGNOSIS — C49A9 Gastrointestinal stromal tumor of other sites: Secondary | ICD-10-CM | POA: Diagnosis not present

## 2020-08-15 DIAGNOSIS — C49A5 Gastrointestinal stromal tumor of rectum: Secondary | ICD-10-CM

## 2020-08-15 DIAGNOSIS — Z79899 Other long term (current) drug therapy: Secondary | ICD-10-CM | POA: Insufficient documentation

## 2020-08-15 LAB — CMP (CANCER CENTER ONLY)
ALT: 17 U/L (ref 0–44)
AST: 29 U/L (ref 15–41)
Albumin: 3.9 g/dL (ref 3.5–5.0)
Alkaline Phosphatase: 91 U/L (ref 38–126)
Anion gap: 9 (ref 5–15)
BUN: 21 mg/dL (ref 8–23)
CO2: 27 mmol/L (ref 22–32)
Calcium: 9 mg/dL (ref 8.9–10.3)
Chloride: 105 mmol/L (ref 98–111)
Creatinine: 0.93 mg/dL (ref 0.61–1.24)
GFR, Estimated: >60 mL/min (ref >60)
Glucose, Bld: 103 mg/dL — ABNORMAL HIGH (ref 70–99)
Potassium: 3.6 mmol/L (ref 3.5–5.1)
Sodium: 141 mmol/L (ref 135–145)
Total Bilirubin: 0.6 mg/dL (ref 0.3–1.2)
Total Protein: 6.5 g/dL (ref 6.5–8.1)

## 2020-08-15 LAB — CBC WITH DIFFERENTIAL (CANCER CENTER ONLY)
Abs Immature Granulocytes: 0 10*3/uL (ref 0.00–0.07)
Basophils Absolute: 0 10*3/uL (ref 0.0–0.1)
Basophils Relative: 1 %
Eosinophils Absolute: 0.1 10*3/uL (ref 0.0–0.5)
Eosinophils Relative: 3 %
HCT: 34.8 % — ABNORMAL LOW (ref 39.0–52.0)
Hemoglobin: 12.2 g/dL — ABNORMAL LOW (ref 13.0–17.0)
Immature Granulocytes: 0 %
Lymphocytes Relative: 24 %
Lymphs Abs: 1.1 10*3/uL (ref 0.7–4.0)
MCH: 33 pg (ref 26.0–34.0)
MCHC: 35.1 g/dL (ref 30.0–36.0)
MCV: 94.1 fL (ref 80.0–100.0)
Monocytes Absolute: 0.3 10*3/uL (ref 0.1–1.0)
Monocytes Relative: 6 %
Neutro Abs: 3.1 10*3/uL (ref 1.7–7.7)
Neutrophils Relative %: 66 %
Platelet Count: 126 10*3/uL — ABNORMAL LOW (ref 150–400)
RBC: 3.7 MIL/uL — ABNORMAL LOW (ref 4.22–5.81)
RDW: 13.4 % (ref 11.5–15.5)
WBC Count: 4.6 10*3/uL (ref 4.0–10.5)
nRBC: 0 % (ref 0.0–0.2)

## 2020-08-15 MED ORDER — IMATINIB MESYLATE 400 MG PO TABS: 400.0000 mg | ORAL_TABLET | Freq: Every day | ORAL | 3 refills | Status: DC

## 2020-08-15 NOTE — Progress Notes (Signed)
[facility] [phone]   [phone]  OFFICE PROGRESS NOTE  [name], MD [place] [address]  DIAGNOSIS:  1) Stage IIB (T1 a, N1, M0) non-small cell lung cancer, adenocarcinoma diagnosed in June 2021. 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  Biomarker Findings Tumor Mutational Burden - 20 Muts/Mb Microsatellite status - MS-Stable Genomic Findings For a complete list of the genes assayed, please refer to the Appendix. KEAP1 I417f*17 [id number] RB1 splice site [id number]* 8 Disease relevant genes with no reportable alterations: ALK, BRAF, EGFR, ERBB2, KRAS, MET, RET, ROS1  PDL1 Expression: 1 %  PRIOR THERAPY:  1) status post left upper lobectomy with lymph node dissection on December 16, 2019 under the care of Dr. LKipp Brood  The tumor size measured 0.9 cm but there was involvement of the level 10 L and 11 L.  2) Adjuvant systemic chemotherapy with cisplatin 75 mg/M2 and Alimta 500 mg/M2 every 3 weeks.  First dose February 04, 2020.  Status post 4 cycles.  CURRENT THERAPY: Neoadjuvant treatment with imatinib 400 mg p.o. daily for the GIST tumor of the pelvic area.  First dose started July 18, 2020.   INTERVAL HISTORY: JUSMAN MILLETT70y.o. male returns to the clinic today for follow-up visit.  The patient is currently on treatment with Gleevec and has been tolerating it fairly well.  He has occasional loose stool and increased urination but no significant nausea, vomiting or constipation.  He denied having any chest pain, shortness of breath, cough or hemoptysis.  He denied having any fever or chills.  He has no significant weight loss or night sweats.  He is here today for evaluation and repeat blood work.  MEDICAL HISTORY: Past Medical History:  Diagnosis Date  . Anemia   . Cancer ([facility]    bladder cancer  . COPD (chronic obstructive pulmonary disease) (HCC)    mild   .  DIVERTICULOSIS, COLON 01/14/2007   Qualifier: Diagnosis of  By: MPaulina FusiRN, JDaine Gravel  . GASTRITIS, CHRONIC 06/01/2004   Qualifier: Diagnosis of  By: SNolon RodCMA (AAMA), Robin    . GERD 01/09/2007   Qualifier: Diagnosis of  By: TScherrie Gerlach   . Heart murmur    as a child; no murmur heard 12/14/19  . HYPERLIPIDEMIA 01/09/2007   Qualifier: Diagnosis of  By: TScherrie Gerlach   . Hypertension   . Lung mass    left upper nodule  . Macular degeneration    right eye  . NEOPLASM, MALIGNANT, BLADDER, HX OF 2002   Qualifier: Diagnosis of  By: SNolon RodCMA (AAMA), Robin  / no chemo or radiation  . OSTEOARTHRITIS 01/14/2007   Qualifier: Diagnosis of  By: MPaulina FusiRN, JDaine Gravel- knees  . Rectal mass   . Reflux esophagitis 06/01/2004   Qualifier: Diagnosis of  By: SNolon RodCMA (AAMA), Robin    . TOBACCO USER 02/16/2009   Qualifier: Diagnosis of  By: KBurnice Logan MD, PDoretha Sou  . VITAMIN D DEFICIENCY 06/03/2007   Qualifier: Diagnosis of  By: KBurnice Logan MD, PDoretha Sou    ALLERGIES:  has No Known Allergies.  MEDICATIONS:  Current Outpatient Medications  Medication Sig Dispense Refill  . amLODipine (NORVASC) 5 MG tablet Take 5 mg by mouth daily.    .Marland Kitchenatorvastatin (LIPITOR) 40 MG tablet Take 20 mg by mouth daily.    . diphenhydrAMINE (BENADRYL) 25  mg capsule Take 1-2 capsules (25-50 mg total) by mouth at bedtime as needed for sleep. 30 capsule 0  . imatinib (GLEEVEC) 400 MG tablet Take 1 tablet (400 mg total) by mouth daily. Take with meals and large glass of water.Caution:Chemotherapy. 30 tablet 3  . omeprazole (PRILOSEC) 40 MG capsule Take 1 capsule (40 mg total) by mouth daily. (Patient taking differently: Take 40 mg by mouth daily before breakfast.) 90 capsule 3   No current facility-administered medications for this visit.    SURGICAL HISTORY:  Past Surgical History:  Procedure Laterality Date  . BRONCHIAL BIOPSY  10/13/2019   Procedure: BRONCHIAL BIOPSIES;  Surgeon: Garner Nash, DO;   Location: [facility];  Service: Pulmonary;;  . BRONCHIAL BRUSHINGS  10/13/2019   Procedure: BRONCHIAL BRUSHINGS;  Surgeon: Garner Nash, DO;  Location: [facility];  Service: Pulmonary;;  . BRONCHIAL NEEDLE ASPIRATION BIOPSY  10/13/2019   Procedure: BRONCHIAL NEEDLE ASPIRATION BIOPSIES;  Surgeon: Garner Nash, DO;  Location: [facility];  Service: Pulmonary;;  . BRONCHIAL WASHINGS  10/13/2019   Procedure: BRONCHIAL WASHINGS;  Surgeon: Garner Nash, DO;  Location: [facility];  Service: Pulmonary;;  . COLONOSCOPY     multiple  . INTERCOSTAL NERVE BLOCK Left 12/16/2019   Procedure: INTERCOSTAL NERVE BLOCK;  Surgeon: Lajuana Matte, MD;  Location: [facility];  Service: Thoracic;  Laterality: Left;  . KNEE SURGERY  07/20/2008   bilat.  Marland Kitchen NASAL SEPTUM SURGERY  1995  . NODE DISSECTION Left 12/16/2019   Procedure: NODE DISSECTION;  Surgeon: Lajuana Matte, MD;  Location: [facility];  Service: Thoracic;  Laterality: Left;  . TONSILLECTOMY    . TRANSURETHRAL RESECTION OF BLADDER TUMOR  2002  . UPPER GASTROINTESTINAL ENDOSCOPY    . VIDEO BRONCHOSCOPY WITH ENDOBRONCHIAL NAVIGATION N/A 10/13/2019   Procedure: VIDEO BRONCHOSCOPY WITH ENDOBRONCHIAL NAVIGATION;  Surgeon: Garner Nash, DO;  Location: [facility];  Service: Pulmonary;  Laterality: N/A;  . WISDOM TOOTH EXTRACTION      REVIEW OF SYSTEMS:  A comprehensive review of systems was negative except for: Gastrointestinal: positive for diarrhea   PHYSICAL EXAMINATION: General appearance: alert, cooperative and no distress Head: Normocephalic, without obvious abnormality, atraumatic Neck: no adenopathy, no JVD, supple, symmetrical, trachea midline and thyroid not enlarged, symmetric, no tenderness/mass/nodules Lymph nodes: Cervical, supraclavicular, and axillary nodes normal. Resp: clear to auscultation bilaterally Back: symmetric, no curvature. ROM normal. No CVA tenderness. Cardio: regular rate and rhythm, S1, S2 normal, no  murmur, click, rub or gallop GI: soft, non-tender; bowel sounds normal; no masses,  no organomegaly Extremities: extremities normal, atraumatic, no cyanosis or edema  ECOG PERFORMANCE STATUS: 1 - Symptomatic but completely ambulatory  Blood pressure 137/84, pulse 78, temperature (!) 97 F (36.1 C), temperature source Tympanic, resp. rate 19, height '5\' 9"'  (1.753 m), weight 177 lb 4.8 oz (80.4 kg), SpO2 100 %.  LABORATORY DATA: Lab Results  Component Value Date   WBC 4.6 08/15/2020   HGB 12.2 (L) 08/15/2020   HCT 34.8 (L) 08/15/2020   MCV 94.1 08/15/2020   PLT 126 (L) 08/15/2020      Chemistry      Component Value Date/Time   NA 143 07/18/2020 0804   K 3.9 07/18/2020 0804   CL 107 07/18/2020 0804   CO2 25 07/18/2020 0804   BUN 27 (H) 07/18/2020 0804   CREATININE 0.84 07/18/2020 0804      Component Value Date/Time   CALCIUM 9.2 07/18/2020 0804   ALKPHOS 82 07/18/2020 0804  AST 22 07/18/2020 0804   ALT 15 07/18/2020 0804   BILITOT 0.6 07/18/2020 0804       RADIOGRAPHIC STUDIES: No results found.  ASSESSMENT AND PLAN: This is a very pleasant 70 years old white male recently diagnosed with a stage IIb (T1 a, N1, M0) non-small cell lung cancer, adenocarcinoma in June 2021 status post left upper lobectomy with lymph node dissection on December 16, 2019 under the care of Dr. Kipp Brood.  The patient has no actionable mutations and PD-L1 expression was 1%. He was also diagnosed with pelvic GIST tumor. The patient completed a course of adjuvant systemic chemotherapy with cisplatin 75 mg/M2 and Alimta 500 mg/M2 every 3 weeks status post 4 cycles.  He tolerated his treatment well except for fatigue and occasional nausea. He is currently on observation for the lung cancer. For the pelvic GIST tumor, the patient is currently on neoadjuvant treatment with imatinib 400 mg p.o. daily.  Status post 1 months.  He has been tolerating this treatment well with no concerning adverse  effects. I recommended for him to continue his current treatment with Gleevec with the same dose. I will see him back for follow-up visit in 1 months for evaluation and repeat blood work in addition to CT scan of the chest for restaging of his lung cancer. For the hypertension, the patient will continue on his current blood pressure medication and monitor it closely at home. He was advised to call immediately if he has any concerning symptoms in the interval. The patient voices understanding of current disease status and treatment options and is in agreement with the current care plan.  All questions were answered. The patient knows to call the clinic with any problems, questions or concerns. We can certainly see the patient much sooner if necessary.   Disclaimer: This note was dictated with voice recognition software. Similar sounding words can inadvertently be transcribed and may not be corrected upon review.

## 2020-08-19 ENCOUNTER — Telehealth: Payer: Self-pay | Admitting: Internal Medicine

## 2020-08-19 NOTE — Telephone Encounter (Signed)
Scheduled per 05/02 los, patient has been called and voicemail was left.

## 2020-08-26 ENCOUNTER — Other Ambulatory Visit: Payer: Medicare Other

## 2020-08-26 ENCOUNTER — Telehealth (HOSPITAL_COMMUNITY): Payer: Self-pay

## 2020-08-29 ENCOUNTER — Ambulatory Visit: Payer: Medicare Other | Admitting: Internal Medicine

## 2020-09-01 ENCOUNTER — Telehealth: Payer: Self-pay | Admitting: Internal Medicine

## 2020-09-01 NOTE — Telephone Encounter (Signed)
Pt called in to r/s appts due to CT scan being r/s. Pt is aware of updated appts dates and times.

## 2020-09-02 ENCOUNTER — Other Ambulatory Visit: Payer: Medicare Other

## 2020-09-05 ENCOUNTER — Ambulatory Visit: Payer: Medicare Other | Admitting: Internal Medicine

## 2020-09-09 ENCOUNTER — Other Ambulatory Visit: Payer: Medicare Other

## 2020-09-09 ENCOUNTER — Ambulatory Visit (HOSPITAL_COMMUNITY): Payer: Medicare Other

## 2020-09-13 ENCOUNTER — Ambulatory Visit: Payer: Medicare Other | Admitting: Internal Medicine

## 2020-09-28 ENCOUNTER — Other Ambulatory Visit: Payer: Self-pay | Admitting: Internal Medicine

## 2020-09-28 ENCOUNTER — Telehealth: Payer: Self-pay | Admitting: *Deleted

## 2020-09-28 DIAGNOSIS — C49A5 Gastrointestinal stromal tumor of rectum: Secondary | ICD-10-CM

## 2020-09-28 NOTE — Telephone Encounter (Signed)
Pt called earlier with concerns about scans.  He reports that he has an appt for CT chest w/ contrast scheduled for 10/10/20 for restating lung cancer.  He states that Dr Julien Nordmann said he would repeat CT/MRI of pelvis in 3 months due to GIST.  He wants to know if Dr Julien Nordmann would want to add this to be done at same time.  Message routed to Dr Julien Nordmann.

## 2020-10-10 ENCOUNTER — Ambulatory Visit (HOSPITAL_COMMUNITY): Payer: Medicare Other

## 2020-10-10 ENCOUNTER — Other Ambulatory Visit: Payer: Self-pay

## 2020-10-10 ENCOUNTER — Inpatient Hospital Stay: Payer: Medicare Other | Attending: Internal Medicine

## 2020-10-10 DIAGNOSIS — I10 Essential (primary) hypertension: Secondary | ICD-10-CM | POA: Insufficient documentation

## 2020-10-10 DIAGNOSIS — C49A9 Gastrointestinal stromal tumor of other sites: Secondary | ICD-10-CM | POA: Insufficient documentation

## 2020-10-10 DIAGNOSIS — C3412 Malignant neoplasm of upper lobe, left bronchus or lung: Secondary | ICD-10-CM | POA: Insufficient documentation

## 2020-10-10 DIAGNOSIS — Z79899 Other long term (current) drug therapy: Secondary | ICD-10-CM | POA: Diagnosis not present

## 2020-10-10 DIAGNOSIS — C3492 Malignant neoplasm of unspecified part of left bronchus or lung: Secondary | ICD-10-CM

## 2020-10-10 DIAGNOSIS — Z902 Acquired absence of lung [part of]: Secondary | ICD-10-CM | POA: Diagnosis not present

## 2020-10-10 DIAGNOSIS — E785 Hyperlipidemia, unspecified: Secondary | ICD-10-CM | POA: Insufficient documentation

## 2020-10-10 DIAGNOSIS — Z8551 Personal history of malignant neoplasm of bladder: Secondary | ICD-10-CM | POA: Insufficient documentation

## 2020-10-10 DIAGNOSIS — D649 Anemia, unspecified: Secondary | ICD-10-CM | POA: Diagnosis not present

## 2020-10-10 DIAGNOSIS — Z9221 Personal history of antineoplastic chemotherapy: Secondary | ICD-10-CM | POA: Insufficient documentation

## 2020-10-10 LAB — CMP (CANCER CENTER ONLY)
ALT: 16 U/L (ref 0–44)
AST: 26 U/L (ref 15–41)
Albumin: 3.6 g/dL (ref 3.5–5.0)
Alkaline Phosphatase: 136 U/L — ABNORMAL HIGH (ref 38–126)
Anion gap: 9 (ref 5–15)
BUN: 24 mg/dL — ABNORMAL HIGH (ref 8–23)
CO2: 24 mmol/L (ref 22–32)
Calcium: 9.2 mg/dL (ref 8.9–10.3)
Chloride: 109 mmol/L (ref 98–111)
Creatinine: 0.86 mg/dL (ref 0.61–1.24)
GFR, Estimated: >60 mL/min (ref >60)
Glucose, Bld: 103 mg/dL — ABNORMAL HIGH (ref 70–99)
Potassium: 4 mmol/L (ref 3.5–5.1)
Sodium: 142 mmol/L (ref 135–145)
Total Bilirubin: 0.5 mg/dL (ref 0.3–1.2)
Total Protein: 6.4 g/dL — ABNORMAL LOW (ref 6.5–8.1)

## 2020-10-10 LAB — CBC WITH DIFFERENTIAL (CANCER CENTER ONLY)
Abs Immature Granulocytes: 0.01 10*3/uL (ref 0.00–0.07)
Basophils Absolute: 0 10*3/uL (ref 0.0–0.1)
Basophils Relative: 1 %
Eosinophils Absolute: 0.1 10*3/uL (ref 0.0–0.5)
Eosinophils Relative: 3 %
HCT: 31.8 % — ABNORMAL LOW (ref 39.0–52.0)
Hemoglobin: 10.9 g/dL — ABNORMAL LOW (ref 13.0–17.0)
Immature Granulocytes: 0 %
Lymphocytes Relative: 23 %
Lymphs Abs: 0.9 10*3/uL (ref 0.7–4.0)
MCH: 34.4 pg — ABNORMAL HIGH (ref 26.0–34.0)
MCHC: 34.3 g/dL (ref 30.0–36.0)
MCV: 100.3 fL — ABNORMAL HIGH (ref 80.0–100.0)
Monocytes Absolute: 0.3 10*3/uL (ref 0.1–1.0)
Monocytes Relative: 8 %
Neutro Abs: 2.7 10*3/uL (ref 1.7–7.7)
Neutrophils Relative %: 65 %
Platelet Count: 126 10*3/uL — ABNORMAL LOW (ref 150–400)
RBC: 3.17 MIL/uL — ABNORMAL LOW (ref 4.22–5.81)
RDW: 15.6 % — ABNORMAL HIGH (ref 11.5–15.5)
WBC Count: 4.1 10*3/uL (ref 4.0–10.5)
nRBC: 0 % (ref 0.0–0.2)

## 2020-10-10 LAB — MAGNESIUM: Magnesium: 1.8 mg/dL (ref 1.7–2.4)

## 2020-10-11 ENCOUNTER — Encounter (HOSPITAL_COMMUNITY): Payer: Self-pay

## 2020-10-11 ENCOUNTER — Ambulatory Visit (HOSPITAL_COMMUNITY)
Admission: RE | Admit: 2020-10-11 | Discharge: 2020-10-11 | Disposition: A | Discharge status: Home / Self Care | Payer: Medicare Other | Source: Ambulatory Visit | Attending: Internal Medicine | Admitting: Internal Medicine

## 2020-10-11 DIAGNOSIS — R911 Solitary pulmonary nodule: Secondary | ICD-10-CM | POA: Diagnosis not present

## 2020-10-11 DIAGNOSIS — C3490 Malignant neoplasm of unspecified part of unspecified bronchus or lung: Secondary | ICD-10-CM | POA: Diagnosis not present

## 2020-10-11 DIAGNOSIS — K5750 Diverticulosis of both small and large intestine without perforation or abscess without bleeding: Secondary | ICD-10-CM | POA: Diagnosis not present

## 2020-10-11 DIAGNOSIS — C49A5 Gastrointestinal stromal tumor of rectum: Secondary | ICD-10-CM | POA: Insufficient documentation

## 2020-10-11 DIAGNOSIS — C3492 Malignant neoplasm of unspecified part of left bronchus or lung: Secondary | ICD-10-CM | POA: Insufficient documentation

## 2020-10-11 DIAGNOSIS — I700 Atherosclerosis of aorta: Secondary | ICD-10-CM | POA: Diagnosis not present

## 2020-10-11 DIAGNOSIS — J439 Emphysema, unspecified: Secondary | ICD-10-CM | POA: Diagnosis not present

## 2020-10-11 MED ORDER — SODIUM CHLORIDE (PF) 0.9 % IJ SOLN
INTRAMUSCULAR | Status: AC
  Filled 2020-10-11: qty 50

## 2020-10-11 MED ORDER — IOHEXOL 300 MG/ML  SOLN
100.0000 mL | Freq: Once | INTRAMUSCULAR | Status: AC | PRN
  Administered 2020-10-11: 08:00:00 100 mL via INTRAVENOUS

## 2020-10-12 ENCOUNTER — Inpatient Hospital Stay: Payer: Medicare Other | Admitting: Internal Medicine

## 2020-10-12 ENCOUNTER — Other Ambulatory Visit: Payer: Self-pay

## 2020-10-12 VITALS — BP 135/74 | HR 74 | Temp 97.4°F | Resp 19 | Ht 69.0 in | Wt 173.9 lb

## 2020-10-12 DIAGNOSIS — Z5111 Encounter for antineoplastic chemotherapy: Secondary | ICD-10-CM

## 2020-10-12 DIAGNOSIS — Z8551 Personal history of malignant neoplasm of bladder: Secondary | ICD-10-CM | POA: Diagnosis not present

## 2020-10-12 DIAGNOSIS — D649 Anemia, unspecified: Secondary | ICD-10-CM | POA: Diagnosis not present

## 2020-10-12 DIAGNOSIS — C3492 Malignant neoplasm of unspecified part of left bronchus or lung: Secondary | ICD-10-CM

## 2020-10-12 DIAGNOSIS — C3412 Malignant neoplasm of upper lobe, left bronchus or lung: Secondary | ICD-10-CM | POA: Diagnosis not present

## 2020-10-12 DIAGNOSIS — E785 Hyperlipidemia, unspecified: Secondary | ICD-10-CM | POA: Diagnosis not present

## 2020-10-12 DIAGNOSIS — Z902 Acquired absence of lung [part of]: Secondary | ICD-10-CM | POA: Diagnosis not present

## 2020-10-12 DIAGNOSIS — I10 Essential (primary) hypertension: Secondary | ICD-10-CM

## 2020-10-12 DIAGNOSIS — C49A5 Gastrointestinal stromal tumor of rectum: Secondary | ICD-10-CM

## 2020-10-12 DIAGNOSIS — Z79899 Other long term (current) drug therapy: Secondary | ICD-10-CM | POA: Diagnosis not present

## 2020-10-12 DIAGNOSIS — Z9221 Personal history of antineoplastic chemotherapy: Secondary | ICD-10-CM | POA: Diagnosis not present

## 2020-10-12 DIAGNOSIS — C49A9 Gastrointestinal stromal tumor of other sites: Secondary | ICD-10-CM | POA: Diagnosis not present

## 2020-10-12 NOTE — Progress Notes (Signed)
[place] [phone]   [phone]  OFFICE PROGRESS NOTE  [name], MD [place] [address]  DIAGNOSIS:  1) Stage IIB (T1 a, N1, M0) non-small cell lung cancer, adenocarcinoma diagnosed in June 2021. 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  Biomarker Findings Tumor Mutational Burden - 20 Muts/Mb Microsatellite status - MS-Stable Genomic Findings For a complete list of the genes assayed, please refer to the Appendix. KEAP1 [id number]*17 [id number] RB1 splice site [id number]* 8 Disease relevant genes with no reportable alterations: ALK, BRAF, EGFR, ERBB2, KRAS, MET, RET, ROS1  PDL1 Expression: 1 %  PRIOR THERAPY:  1) status post left upper lobectomy with lymph node dissection on December 16, 2019 under the care of Dr. LKipp Brood  The tumor size measured 0.9 cm but there was involvement of the level 10 L and 11 L.  2) Adjuvant systemic chemotherapy with cisplatin 75 mg/M2 and Alimta 500 mg/M2 every 3 weeks.  First dose February 04, 2020.  Status post 4 cycles.  CURRENT THERAPY: Neoadjuvant treatment with imatinib 400 mg p.o. daily for the GIST tumor of the pelvic area.  First dose started July 18, 2020.  Status post 3 months of treatment.   INTERVAL HISTORY: Jesse WHITTLESEY70y.o. male returns to the clinic today for follow-up visit accompanied by his wife.  The patient is feeling fine today with no concerning complaints except for shortness of breath with exertion and mild fatigue.  He denied having any current chest pain, cough or hemoptysis.  He denied having any fever or chills.  He has no nausea, vomiting, diarrhea or constipation.  He has no headache or visual changes.  He continues to tolerate his treatment with Gleevec fairly well.  The patient is here today for evaluation with repeat CT scan of the chest, abdomen and pelvis for restaging of his disease.  MEDICAL HISTORY: Past Medical  History:  Diagnosis Date   Anemia    Cancer ([facility]    bladder cancer   COPD (chronic obstructive pulmonary disease) (HSan Fidel    mild    DIVERTICULOSIS, COLON 01/14/2007   Qualifier: Diagnosis of  By: MPaulina FusiRN, JDaine Gravel   GASTRITIS, CHRONIC 06/01/2004   Qualifier: Diagnosis of  By: SNolon RodCMA (AAMA), Robin     GERD 01/09/2007   Qualifier: Diagnosis of  By: TSherren Mocha RN, Ellen     Heart murmur    as a child; no murmur heard 12/14/19   HYPERLIPIDEMIA 01/09/2007   Qualifier: Diagnosis of  By: TSherren MochaRN, EDorian Pod    Hypertension    Lung mass    left upper nodule   Macular degeneration    right eye   NEOPLASM, MALIGNANT, BLADDER, HX OF 2002   Qualifier: Diagnosis of  By: SNolon RodCMA (AAMA), Robin  / no chemo or radiation   OSTEOARTHRITIS 01/14/2007   Qualifier: Diagnosis of  By: MPaulina Fusi RN, JDaine Gravel- knees   Rectal mass    Reflux esophagitis 06/01/2004   Qualifier: Diagnosis of  By: SNolon RodCMA (AAMA), Robin     TOBACCO USER 02/16/2009   Qualifier: Diagnosis of  By: KBurnice Logan MD, PDoretha Sou   VITAMIN D DEFICIENCY 06/03/2007   Qualifier: Diagnosis of  By: KBurnice Logan MD, PDoretha Sou    ALLERGIES:  has No Known Allergies.  MEDICATIONS:  Current Outpatient Medications  Medication Sig Dispense Refill   amLODipine (NORVASC) 5 MG tablet  Take 5 mg by mouth daily.     atorvastatin (LIPITOR) 40 MG tablet Take 20 mg by mouth daily.     diphenhydrAMINE (BENADRYL) 25 mg capsule Take 1-2 capsules (25-50 mg total) by mouth at bedtime as needed for sleep. 30 capsule 0   imatinib (GLEEVEC) 400 MG tablet Take 1 tablet (400 mg total) by mouth daily. Take with meals and large glass of water.Caution:Chemotherapy. 30 tablet 3   omeprazole (PRILOSEC) 40 MG capsule Take 1 capsule (40 mg total) by mouth daily. (Patient taking differently: Take 40 mg by mouth daily before breakfast.) 90 capsule 3   No current facility-administered medications for this visit.    SURGICAL HISTORY:  Past Surgical History:   Procedure Laterality Date   BRONCHIAL BIOPSY  10/13/2019   Procedure: BRONCHIAL BIOPSIES;  Surgeon: Garner Nash, DO;  Location: [facility];  Service: Pulmonary;;   BRONCHIAL BRUSHINGS  10/13/2019   Procedure: BRONCHIAL BRUSHINGS;  Surgeon: Garner Nash, DO;  Location: [facility];  Service: Pulmonary;;   BRONCHIAL NEEDLE ASPIRATION BIOPSY  10/13/2019   Procedure: BRONCHIAL NEEDLE ASPIRATION BIOPSIES;  Surgeon: Garner Nash, DO;  Location: [facility];  Service: Pulmonary;;   BRONCHIAL WASHINGS  10/13/2019   Procedure: BRONCHIAL WASHINGS;  Surgeon: Garner Nash, DO;  Location: [facility];  Service: Pulmonary;;   COLONOSCOPY     multiple   INTERCOSTAL NERVE BLOCK Left 12/16/2019   Procedure: INTERCOSTAL NERVE BLOCK;  Surgeon: Lajuana Matte, MD;  Location: [facility];  Service: Thoracic;  Laterality: Left;   KNEE SURGERY  07/20/2008   bilat.   NASAL SEPTUM SURGERY  1995   NODE DISSECTION Left 12/16/2019   Procedure: NODE DISSECTION;  Surgeon: Lajuana Matte, MD;  Location: [facility];  Service: Thoracic;  Laterality: Left;   TONSILLECTOMY     TRANSURETHRAL RESECTION OF BLADDER TUMOR  2002   UPPER GASTROINTESTINAL ENDOSCOPY     VIDEO BRONCHOSCOPY WITH ENDOBRONCHIAL NAVIGATION N/A 10/13/2019   Procedure: VIDEO BRONCHOSCOPY WITH ENDOBRONCHIAL NAVIGATION;  Surgeon: Garner Nash, DO;  Location: [facility];  Service: Pulmonary;  Laterality: N/A;   WISDOM TOOTH EXTRACTION      REVIEW OF SYSTEMS:  Constitutional: positive for fatigue Eyes: negative Ears, nose, mouth, throat, and face: negative Respiratory: positive for dyspnea on exertion Cardiovascular: negative Gastrointestinal: negative Genitourinary:negative Integument/breast: negative Hematologic/lymphatic: negative Musculoskeletal:negative Neurological: negative Behavioral/Psych: negative Endocrine: negative Allergic/Immunologic: negative   PHYSICAL EXAMINATION: General appearance: alert, cooperative,  fatigued, and no distress Head: Normocephalic, without obvious abnormality, atraumatic Neck: no adenopathy, no JVD, supple, symmetrical, trachea midline, and thyroid not enlarged, symmetric, no tenderness/mass/nodules Lymph nodes: Cervical, supraclavicular, and axillary nodes normal. Resp: clear to auscultation bilaterally Back: symmetric, no curvature. ROM normal. No CVA tenderness. Cardio: regular rate and rhythm, S1, S2 normal, no murmur, click, rub or gallop GI: soft, non-tender; bowel sounds normal; no masses,  no organomegaly Extremities: extremities normal, atraumatic, no cyanosis or edema Neurologic: Alert and oriented X 3, normal strength and tone. Normal symmetric reflexes. Normal coordination and gait  ECOG PERFORMANCE STATUS: 1 - Symptomatic but completely ambulatory  Blood pressure 135/74, pulse 74, temperature (!) 97.4 F (36.3 C), temperature source Tympanic, resp. rate 19, height '5\' 9"'  (1.753 m), weight 173 lb 14.4 oz (78.9 kg), SpO2 100 %.  LABORATORY DATA: Lab Results  Component Value Date   WBC 4.1 10/10/2020   HGB 10.9 (L) 10/10/2020   HCT 31.8 (L) 10/10/2020   MCV 100.3 (H) 10/10/2020   PLT 126 (L) 10/10/2020  Chemistry      Component Value Date/Time   NA 142 10/10/2020 0736   K 4.0 10/10/2020 0736   CL 109 10/10/2020 0736   CO2 24 10/10/2020 0736   BUN 24 (H) 10/10/2020 0736   CREATININE 0.86 10/10/2020 0736      Component Value Date/Time   CALCIUM 9.2 10/10/2020 0736   ALKPHOS 136 (H) 10/10/2020 0736   AST 26 10/10/2020 0736   ALT 16 10/10/2020 0736   BILITOT 0.5 10/10/2020 0736       RADIOGRAPHIC STUDIES: CT Chest W Contrast  Result Date: 10/11/2020 CLINICAL DATA:  Primary Cancer Type: Lung Imaging Indication: Assess response to therapy Interval therapy since last imaging? Yes Initial Cancer Diagnosis Date: 10/13/2019; Established by: Biopsy-proven Detailed Pathology: Stage IIB non-small cell lung cancer, adenocarcinoma. Primary Tumor  location: Left upper lobe. Surgeries: Left upper lobectomy 12/16/2019. Chemotherapy: Yes; Ongoing? Yes; Most recent administration: Imatinib daily for GIST. Alimta most recent administration 04/07/2020. Immunotherapy? No Radiation therapy? 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 History of bladder cancer, TURBT 2002. EXAM: CT CHEST, ABDOMEN, AND PELVIS WITH CONTRAST TECHNIQUE: Multidetector CT imaging of the chest, abdomen and pelvis was performed following the standard protocol during bolus administration of intravenous contrast. CONTRAST:  160m OMNIPAQUE IOHEXOL 300 MG/ML  SOLN COMPARISON:  Most recent CT chest 05/27/2020. Most recent CT abdomen and pelvis 10/09/2019. 07/14/2020 MRI pelvis. 09/16/2019 PET-CT. FINDINGS: CT CHEST FINDINGS Cardiovascular: Calcified atheromatous plaque of the thoracic aorta. Three-vessel coronary artery disease. No pericardial effusion. Noncalcified plaque of the thoracic aorta is well. Normal caliber of the central pulmonary vessels. Distortion of LEFT hilum. Mediastinum/Nodes: Thoracic inlet structures are unremarkable. No axillary lymphadenopathy. No mediastinal lymphadenopathy. No hilar lymphadenopathy. Esophagus unremarkable. Lungs/Pleura: Small pulmonary nodule RIGHT lung apex (image 30/6) 5-6 mm within 1 mm of previous size. Other small nodules in the RIGHT upper lobe adjacent and apical scarring without change. No effusion. No consolidation. Post LEFT upper lobectomy. Stable nodule in the LEFT mid chest (image 84/6) approximately 4-5 mm. Musculoskeletal: No chest wall lesion aside from presumed sebaceous cyst in the posterior LEFT chest showing no change from previous imaging. See below for full musculoskeletal details. CT ABDOMEN PELVIS FINDINGS Hepatobiliary: Stable heterogeneity of both LEFT and RIGHT hepatic lobes on venous phase, no discrete suspicious lesion. No biliary duct dilation. No pericholecystic stranding. Portal vein patent into  the liver. Pancreas: Normal, without mass, inflammation or ductal dilatation. Spleen: Spleen normal size and contour. Adrenals/Urinary Tract: Adrenal glands are normal. Persistent perinephric stranding which is unchanged. No hydronephrosis. No visible nephrolithiasis. Decompressed urinary bladder. Stomach/Bowel: Stomach under distended. No adjacent stranding. Small bowel with normal caliber and without signs of acute abnormality. The appendix is normal. Stool mixed with contrast fills the proximal colon. Distal colon largely collapsed though also mildly stool filled. Sigmoid diverticulosis. Ovoid area of low attenuation previously shown to have peripheral enhancement has diminished in size between the RIGHT anterolateral rectum and the prostate, currently measuring 3.6 x 3.6 cm previously approximately 5.3 x 4.0 cm. Vascular/Lymphatic: Calcified and noncalcified atheromatous plaque of the abdominal aorta. Smooth contour of the IVC. There is no gastrohepatic or hepatoduodenal ligament lymphadenopathy. No retroperitoneal or mesenteric lymphadenopathy. No pelvic sidewall lymphadenopathy. Reproductive: Prostate inseparable from GIST tumor in the pelvis as before. Other: No free air.  No ascites. Musculoskeletal: Spinal degenerative changes. Degenerative changes in the hips. No acute bone finding. No destructive bone process. IMPRESSION: Interval decrease in size and of enhancement of the pelvic tumor  compatible with response to therapy. No signs of metastatic disease. Signs of LEFT upper lobectomy as before with stable appearance of RIGHT upper lobe nodules. Attention on follow-up. Pulmonary emphysema as before. Aortic Atherosclerosis (ICD10-I70.0) and Emphysema (ICD10-J43.9). Electronically Signed   By: Zetta Bills M.D.   On: 10/11/2020 11:14   CT Abdomen Pelvis W Contrast  Result Date: 10/11/2020 CLINICAL DATA:  Primary Cancer Type: Lung Imaging Indication: Assess response to therapy Interval therapy since last  imaging? Yes Initial Cancer Diagnosis Date: 10/13/2019; Established by: Biopsy-proven Detailed Pathology: Stage IIB non-small cell lung cancer, adenocarcinoma. Primary Tumor location: Left upper lobe. Surgeries: Left upper lobectomy 12/16/2019. Chemotherapy: Yes; Ongoing? Yes; Most recent administration: Imatinib daily for GIST. Alimta most recent administration 04/07/2020. Immunotherapy? No Radiation therapy? 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 History of bladder cancer, TURBT 2002. EXAM: CT CHEST, ABDOMEN, AND PELVIS WITH CONTRAST TECHNIQUE: Multidetector CT imaging of the chest, abdomen and pelvis was performed following the standard protocol during bolus administration of intravenous contrast. CONTRAST:  190m OMNIPAQUE IOHEXOL 300 MG/ML  SOLN COMPARISON:  Most recent CT chest 05/27/2020. Most recent CT abdomen and pelvis 10/09/2019. 07/14/2020 MRI pelvis. 09/16/2019 PET-CT. FINDINGS: CT CHEST FINDINGS Cardiovascular: Calcified atheromatous plaque of the thoracic aorta. Three-vessel coronary artery disease. No pericardial effusion. Noncalcified plaque of the thoracic aorta is well. Normal caliber of the central pulmonary vessels. Distortion of LEFT hilum. Mediastinum/Nodes: Thoracic inlet structures are unremarkable. No axillary lymphadenopathy. No mediastinal lymphadenopathy. No hilar lymphadenopathy. Esophagus unremarkable. Lungs/Pleura: Small pulmonary nodule RIGHT lung apex (image 30/6) 5-6 mm within 1 mm of previous size. Other small nodules in the RIGHT upper lobe adjacent and apical scarring without change. No effusion. No consolidation. Post LEFT upper lobectomy. Stable nodule in the LEFT mid chest (image 84/6) approximately 4-5 mm. Musculoskeletal: No chest wall lesion aside from presumed sebaceous cyst in the posterior LEFT chest showing no change from previous imaging. See below for full musculoskeletal details. CT ABDOMEN PELVIS FINDINGS Hepatobiliary: Stable  heterogeneity of both LEFT and RIGHT hepatic lobes on venous phase, no discrete suspicious lesion. No biliary duct dilation. No pericholecystic stranding. Portal vein patent into the liver. Pancreas: Normal, without mass, inflammation or ductal dilatation. Spleen: Spleen normal size and contour. Adrenals/Urinary Tract: Adrenal glands are normal. Persistent perinephric stranding which is unchanged. No hydronephrosis. No visible nephrolithiasis. Decompressed urinary bladder. Stomach/Bowel: Stomach under distended. No adjacent stranding. Small bowel with normal caliber and without signs of acute abnormality. The appendix is normal. Stool mixed with contrast fills the proximal colon. Distal colon largely collapsed though also mildly stool filled. Sigmoid diverticulosis. Ovoid area of low attenuation previously shown to have peripheral enhancement has diminished in size between the RIGHT anterolateral rectum and the prostate, currently measuring 3.6 x 3.6 cm previously approximately 5.3 x 4.0 cm. Vascular/Lymphatic: Calcified and noncalcified atheromatous plaque of the abdominal aorta. Smooth contour of the IVC. There is no gastrohepatic or hepatoduodenal ligament lymphadenopathy. No retroperitoneal or mesenteric lymphadenopathy. No pelvic sidewall lymphadenopathy. Reproductive: Prostate inseparable from GIST tumor in the pelvis as before. Other: No free air.  No ascites. Musculoskeletal: Spinal degenerative changes. Degenerative changes in the hips. No acute bone finding. No destructive bone process. IMPRESSION: Interval decrease in size and of enhancement of the pelvic tumor compatible with response to therapy. No signs of metastatic disease. Signs of LEFT upper lobectomy as before with stable appearance of RIGHT upper lobe nodules. Attention on follow-up. Pulmonary emphysema as before. Aortic Atherosclerosis (ICD10-I70.0) and Emphysema (ICD10-J43.9).  Electronically Signed   By: Zetta Bills M.D.   On: 10/11/2020  11:14    ASSESSMENT AND PLAN: This is a very pleasant 70 years old white male recently diagnosed with a stage IIb (T1 a, N1, M0) non-small cell lung cancer, adenocarcinoma in June 2021 status post left upper lobectomy with lymph node dissection on December 16, 2019 under the care of Dr. Kipp Brood.  The patient has no actionable mutations and PD-L1 expression was 1%. He was also diagnosed with pelvic GIST tumor. The patient completed a course of adjuvant systemic chemotherapy with cisplatin 75 mg/M2 and Alimta 500 mg/M2 every 3 weeks status post 4 cycles.  He tolerated his treatment well except for fatigue and occasional nausea. He is currently on observation for the lung cancer. For the pelvic GIST tumor, the patient is currently on neoadjuvant treatment with imatinib 400 mg p.o. daily.  Status post 3 months.   The patient has been tolerating his treatment with Gleevec fairly well with no concerning adverse effects. He had repeat CT scan of the chest, abdomen pelvis performed recently.  I personally and independently reviewed the scan images and discussed the results with the patient and his wife. Has a scan showed no evidence for disease recurrence in the chest and the patient has interval decrease in the size and enhancement of the pelvic tumor compatible with response to therapy. I recommended for him to continue his current treatment with Gleevec with the same dose. I will see him back for follow-up visit in 6 weeks for evaluation and repeat blood work. For the anemia, the patient was advised to take multivitamins as well as iron tablets. For the hypertension, he will continue to monitor it closely at home and discussed with his primary care physician for any changes in his medication if needed. The patient was advised to call immediately if he has any other concerning symptoms in the interval.  The patient voices understanding of current disease status and treatment options and is in agreement  with the current care plan.  All questions were answered. The patient knows to call the clinic with any problems, questions or concerns. We can certainly see the patient much sooner if necessary.   Disclaimer: This note was dictated with voice recognition software. Similar sounding words can inadvertently be transcribed and may not be corrected upon review.

## 2020-10-14 ENCOUNTER — Telehealth: Payer: Self-pay | Admitting: Internal Medicine

## 2020-10-14 NOTE — Telephone Encounter (Signed)
Scheduled per los. Called and spoke with patient. Confirmed appt 

## 2020-10-24 ENCOUNTER — Other Ambulatory Visit: Payer: Self-pay

## 2020-10-24 ENCOUNTER — Encounter: Payer: Self-pay | Admitting: Family Medicine

## 2020-10-24 ENCOUNTER — Ambulatory Visit (INDEPENDENT_AMBULATORY_CARE_PROVIDER_SITE_OTHER): Payer: Medicare Other | Admitting: Family Medicine

## 2020-10-24 VITALS — BP 128/82 | HR 84 | Temp 98.6°F | Ht 69.0 in | Wt 176.8 lb

## 2020-10-24 DIAGNOSIS — Z125 Encounter for screening for malignant neoplasm of prostate: Secondary | ICD-10-CM

## 2020-10-24 DIAGNOSIS — R5383 Other fatigue: Secondary | ICD-10-CM

## 2020-10-24 DIAGNOSIS — C49A5 Gastrointestinal stromal tumor of rectum: Secondary | ICD-10-CM | POA: Diagnosis not present

## 2020-10-24 DIAGNOSIS — D696 Thrombocytopenia, unspecified: Secondary | ICD-10-CM

## 2020-10-24 DIAGNOSIS — Z0000 Encounter for general adult medical examination without abnormal findings: Secondary | ICD-10-CM

## 2020-10-24 DIAGNOSIS — C3492 Malignant neoplasm of unspecified part of left bronchus or lung: Secondary | ICD-10-CM

## 2020-10-24 LAB — CBC WITH DIFFERENTIAL/PLATELET
Basophils Absolute: 0 10*3/uL (ref 0.0–0.1)
Basophils Relative: 0.3 % (ref 0.0–3.0)
Eosinophils Absolute: 0.1 10*3/uL (ref 0.0–0.7)
Eosinophils Relative: 1.8 % (ref 0.0–5.0)
HCT: 32.5 % — ABNORMAL LOW (ref 39.0–52.0)
Hemoglobin: 11.4 g/dL — ABNORMAL LOW (ref 13.0–17.0)
Lymphocytes Relative: 13.8 % (ref 12.0–46.0)
Lymphs Abs: 0.9 10*3/uL (ref 0.7–4.0)
MCHC: 35 g/dL (ref 30.0–36.0)
MCV: 100.7 fl — ABNORMAL HIGH (ref 78.0–100.0)
Monocytes Absolute: 0.3 10*3/uL (ref 0.1–1.0)
Monocytes Relative: 5.1 % (ref 3.0–12.0)
Neutro Abs: 5.3 10*3/uL (ref 1.4–7.7)
Neutrophils Relative %: 79 % — ABNORMAL HIGH (ref 43.0–77.0)
Platelets: 155 10*3/uL (ref 150.0–400.0)
RBC: 3.23 Mil/uL — ABNORMAL LOW (ref 4.22–5.81)
RDW: 14.7 % (ref 11.5–15.5)
WBC: 6.8 10*3/uL (ref 4.0–10.5)

## 2020-10-24 LAB — IBC + FERRITIN
Ferritin: 267.9 ng/mL (ref 22.0–322.0)
Iron: 115 ug/dL (ref 42–165)
Saturation Ratios: 34.5 % (ref 20.0–50.0)
Transferrin: 238 mg/dL (ref 212.0–360.0)

## 2020-10-24 LAB — VITAMIN D 25 HYDROXY (VIT D DEFICIENCY, FRACTURES): VITD: 20.27 ng/mL — ABNORMAL LOW (ref 30.00–100.00)

## 2020-10-24 LAB — VITAMIN B12: Vitamin B-12: 357 pg/mL (ref 211–911)

## 2020-10-24 LAB — TSH: TSH: 1.1 u[IU]/mL (ref 0.35–5.50)

## 2020-10-24 LAB — PSA: PSA: 3.12 ng/mL (ref 0.10–4.00)

## 2020-10-24 LAB — T4, FREE: Free T4: 0.82 ng/dL (ref 0.60–1.60)

## 2020-10-24 MED ORDER — CYANOCOBALAMIN 1000 MCG/ML IJ SOLN
1000.0000 ug | Freq: Once | INTRAMUSCULAR | Status: AC
  Administered 2020-10-24: 1000 ug via INTRAMUSCULAR

## 2020-10-24 NOTE — Progress Notes (Signed)
Subjective:     Jesse [name] is a 70 y.o. male and is here for a comprehensive physical exam. Pt states he is feeling good.  Working in his garden.  Notes lung Cancer stable and GIST tumor is slightly smaller.  Notes some fatigue.  Iron low a few wks ago on labs.  Social History   Socioeconomic History   Marital status: Married    Spouse name: Not on file   Number of children: 3   Years of education: Not on file   Highest education level: Not on file  Occupational History    Comment: semi retired   Tobacco Use   Smoking status: Former    Packs/day: 0.50    Years: 40.00    Pack years: 20.00    Types: Cigarettes    Quit date: 09/21/2019    Years since quitting: 1.0   Smokeless tobacco: Never  Vaping Use   Vaping Use: Never used  Substance and Sexual Activity   Alcohol use: Yes    Alcohol/week: 15.0 standard drinks    Types: 15 Standard drinks or equivalent per week    Comment: wine/beer/martini   Drug use: Yes    Types: Marijuana    Comment: Smokes marijuana occasional, last use in 08/2019   Sexual activity: Not on file  Other Topics Concern   Not on file  Social History Narrative   Married   3 children: 3 grandchildren; 3 great grandchildren   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   Enjoys yard work and [place] Strain: Low Risk    Difficulty of Paying Living Expenses: Not hard at [employer] Insecurity: No Food Insecurity   Worried About Charity fundraiser in the Last Year: Never true   Arboriculturist in the Last Year: Never true  Transportation Needs: No Transportation Needs   Lack of Transportation (Medical): No   Lack of Transportation (Non-Medical): No  Physical Activity: Sufficiently Active   Days of Exercise per Week: 7 days   Minutes of Exercise per Session: 60 min  Stress: No Stress Concern Present   Feeling of Stress : Not at all  Social Connections: Moderately Isolated   Frequency of  Communication with Friends and Family: More than three times a week   Frequency of Social Gatherings with Friends and Family: More than three times a week   Attends Religious Services: Never   Marine scientist or Organizations: No   Attends Music therapist: Never   Marital Status: Married  Human resources officer Violence: Not At Risk   Fear of Current or Ex-Partner: No   Emotionally Abused: No   Physically Abused: No   Sexually Abused: No   Health Maintenance  Topic Date Due   Zoster Vaccines- Shingrix (1 of 2) Never done   COVID-19 Vaccine (4 - Booster for Pfizer series) 06/15/2020   INFLUENZA VACCINE  11/14/2020   TETANUS/TDAP  10/08/2022   Hepatitis C Screening  Completed   PNA vac Low Risk Adult  Completed   HPV VACCINES  Aged Out    The following portions of the patient's history were reviewed and updated as appropriate: allergies, current medications, past family history, past medical history, past social history, past surgical history, and problem list.  Review of Systems Pertinent items noted in HPI and remainder of comprehensive ROS otherwise negative.   Objective:    BP 128/82 (BP Location:  Left Arm, Patient Position: Sitting, Cuff Size: Normal)   Pulse 84   Temp 98.6 F (37 C) (Oral)   Ht 5\' 9"  (1.753 m)   Wt 176 lb 12.8 oz (80.2 kg)   SpO2 99%   BMI 26.11 kg/m  General appearance: alert, cooperative, and no distress Head: Normocephalic, without obvious abnormality, atraumatic Eyes: conjunctivae/corneas clear. PERRL, EOM's intact. Fundi benign. Ears: normal TM's and external ear canals both ears Nose: Nares normal. Septum midline. Mucosa normal. No drainage or sinus tenderness. Throat: lips, mucosa, and tongue normal; teeth and gums normal Neck: no adenopathy, no carotid bruit, no JVD, supple, symmetrical, trachea midline, and thyroid not enlarged, symmetric, no tenderness/mass/nodules Lungs: clear to auscultation bilaterally Heart: regular rate  and rhythm, S1, S2 normal, no murmur, click, rub or gallop Abdomen: soft, non-tender; bowel sounds normal; no masses,  no organomegaly Extremities: extremities normal, atraumatic, no cyanosis or edema Pulses: 2+ and symmetric Skin: Skin color, texture, turgor normal. No rashes or lesions Lymph nodes: Cervical, supraclavicular, and axillary nodes normal. Neurologic: Alert and oriented X 3, normal strength and tone. Normal symmetric reflexes. Normal coordination and gait    Assessment:    Healthy male exam.      Plan:    Anticipatory guidance given including wearing seatbelts, smoke detectors in the home, increasing physical activity, increasing p.o. intake of water and vegetables. -labs -Colonoscopy up-to-date.  flex sigmoidoscopy done 10/21/2019 -Given handout -Next CPE in 1 year See After Visit Summary for Counseling Recommendations   Thrombocytopenia ([place]) -likely 2/2 chemo -continue MVIs with iron daily - Plan: CBC with Differential/Platelet  Adenocarcinoma of left lung, stage 2 (HCC) -T1, N1, M0 -s/p L upper lobectomy, 4 cycles adjuvant systemic chemo with Cisplatin and Alimta -on observation -CT chest 10/11/2020 without signs of metastatic disease.  Signs of left upper lobectomy, right upper lobe nodules stable.  Pulmonary emphysema.  Aortic atherosclerosis.  Degenerative changes in the hips and spine. 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

## 2020-10-28 ENCOUNTER — Other Ambulatory Visit: Payer: Self-pay | Admitting: Family Medicine

## 2020-10-28 DIAGNOSIS — E559 Vitamin D deficiency, unspecified: Secondary | ICD-10-CM

## 2020-10-28 MED ORDER — VITAMIN D (ERGOCALCIFEROL) 1.25 MG (50000 UNIT) PO CAPS: 50000.0000 [IU] | ORAL_CAPSULE | ORAL | 0 refills | Status: DC

## 2020-10-31 ENCOUNTER — Other Ambulatory Visit: Payer: Self-pay

## 2020-10-31 DIAGNOSIS — E559 Vitamin D deficiency, unspecified: Secondary | ICD-10-CM

## 2020-10-31 MED ORDER — VITAMIN D (ERGOCALCIFEROL) 1.25 MG (50000 UNIT) PO CAPS: 50000.0000 [IU] | ORAL_CAPSULE | ORAL | 0 refills | Status: DC

## 2020-11-10 ENCOUNTER — Other Ambulatory Visit: Payer: Self-pay | Admitting: Family Medicine

## 2020-11-10 DIAGNOSIS — I10 Essential (primary) hypertension: Secondary | ICD-10-CM

## 2020-11-21 ENCOUNTER — Telehealth: Payer: Self-pay

## 2020-11-21 ENCOUNTER — Other Ambulatory Visit: Payer: Self-pay

## 2020-11-21 NOTE — Telephone Encounter (Signed)
Patient called stating he is running out of Amlodipine 5 mg and requesting refill also needs Atorvastatin 40 mg and omeprazole 40 mg

## 2020-11-22 ENCOUNTER — Encounter: Payer: Self-pay | Admitting: Internal Medicine

## 2020-11-22 ENCOUNTER — Inpatient Hospital Stay: Payer: Medicare Other | Attending: Internal Medicine | Admitting: Internal Medicine

## 2020-11-22 ENCOUNTER — Inpatient Hospital Stay: Payer: Medicare Other

## 2020-11-22 ENCOUNTER — Other Ambulatory Visit: Payer: Self-pay

## 2020-11-22 VITALS — BP 141/81 | HR 81 | Temp 98.1°F | Resp 17 | Wt 174.5 lb

## 2020-11-22 DIAGNOSIS — Z79899 Other long term (current) drug therapy: Secondary | ICD-10-CM | POA: Diagnosis not present

## 2020-11-22 DIAGNOSIS — D649 Anemia, unspecified: Secondary | ICD-10-CM | POA: Insufficient documentation

## 2020-11-22 DIAGNOSIS — I10 Essential (primary) hypertension: Secondary | ICD-10-CM | POA: Insufficient documentation

## 2020-11-22 DIAGNOSIS — E785 Hyperlipidemia, unspecified: Secondary | ICD-10-CM | POA: Diagnosis not present

## 2020-11-22 DIAGNOSIS — Z85118 Personal history of other malignant neoplasm of bronchus and lung: Secondary | ICD-10-CM | POA: Diagnosis not present

## 2020-11-22 DIAGNOSIS — Z8551 Personal history of malignant neoplasm of bladder: Secondary | ICD-10-CM | POA: Insufficient documentation

## 2020-11-22 DIAGNOSIS — C49A9 Gastrointestinal stromal tumor of other sites: Secondary | ICD-10-CM | POA: Insufficient documentation

## 2020-11-22 DIAGNOSIS — Z87891 Personal history of nicotine dependence: Secondary | ICD-10-CM | POA: Insufficient documentation

## 2020-11-22 DIAGNOSIS — C49A5 Gastrointestinal stromal tumor of rectum: Secondary | ICD-10-CM | POA: Diagnosis not present

## 2020-11-22 DIAGNOSIS — C3492 Malignant neoplasm of unspecified part of left bronchus or lung: Secondary | ICD-10-CM

## 2020-11-22 DIAGNOSIS — Z9221 Personal history of antineoplastic chemotherapy: Secondary | ICD-10-CM | POA: Insufficient documentation

## 2020-11-22 DIAGNOSIS — Z902 Acquired absence of lung [part of]: Secondary | ICD-10-CM | POA: Diagnosis not present

## 2020-11-22 DIAGNOSIS — Z5111 Encounter for antineoplastic chemotherapy: Secondary | ICD-10-CM

## 2020-11-22 LAB — CBC WITH DIFFERENTIAL (CANCER CENTER ONLY)
Abs Immature Granulocytes: 0.02 10*3/uL (ref 0.00–0.07)
Basophils Absolute: 0 10*3/uL (ref 0.0–0.1)
Basophils Relative: 0 %
Eosinophils Absolute: 0.2 10*3/uL (ref 0.0–0.5)
Eosinophils Relative: 4 %
HCT: 33.9 % — ABNORMAL LOW (ref 39.0–52.0)
Hemoglobin: 11.8 g/dL — ABNORMAL LOW (ref 13.0–17.0)
Immature Granulocytes: 0 %
Lymphocytes Relative: 22 %
Lymphs Abs: 1 10*3/uL (ref 0.7–4.0)
MCH: 35.1 pg — ABNORMAL HIGH (ref 26.0–34.0)
MCHC: 34.8 g/dL (ref 30.0–36.0)
MCV: 100.9 fL — ABNORMAL HIGH (ref 80.0–100.0)
Monocytes Absolute: 0.3 10*3/uL (ref 0.1–1.0)
Monocytes Relative: 5 %
Neutro Abs: 3.2 10*3/uL (ref 1.7–7.7)
Neutrophils Relative %: 69 %
Platelet Count: 125 10*3/uL — ABNORMAL LOW (ref 150–400)
RBC: 3.36 MIL/uL — ABNORMAL LOW (ref 4.22–5.81)
RDW: 13.2 % (ref 11.5–15.5)
WBC Count: 4.7 10*3/uL (ref 4.0–10.5)
nRBC: 0 % (ref 0.0–0.2)

## 2020-11-22 LAB — CMP (CANCER CENTER ONLY)
ALT: 20 U/L (ref 0–44)
AST: 26 U/L (ref 15–41)
Albumin: 3.8 g/dL (ref 3.5–5.0)
Alkaline Phosphatase: 115 U/L (ref 38–126)
Anion gap: 8 (ref 5–15)
BUN: 19 mg/dL (ref 8–23)
CO2: 27 mmol/L (ref 22–32)
Calcium: 9.3 mg/dL (ref 8.9–10.3)
Chloride: 108 mmol/L (ref 98–111)
Creatinine: 0.84 mg/dL (ref 0.61–1.24)
GFR, Estimated: >60 mL/min (ref >60)
Glucose, Bld: 108 mg/dL — ABNORMAL HIGH (ref 70–99)
Potassium: 3.9 mmol/L (ref 3.5–5.1)
Sodium: 143 mmol/L (ref 135–145)
Total Bilirubin: 0.4 mg/dL (ref 0.3–1.2)
Total Protein: 6.4 g/dL — ABNORMAL LOW (ref 6.5–8.1)

## 2020-11-22 MED ORDER — IMATINIB MESYLATE 400 MG PO TABS: 400.0000 mg | ORAL_TABLET | Freq: Every day | ORAL | 3 refills | Status: DC

## 2020-11-22 NOTE — Progress Notes (Signed)
[place] Telephone:(336) [phone]   Fax:(336) [phone]  OFFICE PROGRESS NOTE  [name], MD [place] [address]  DIAGNOSIS:  1) Stage IIB (T1 a, N1, M0) non-small cell lung cancer, adenocarcinoma diagnosed in June 2021. 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  Biomarker Findings Tumor Mutational Burden - 20 Muts/Mb Microsatellite status - MS-Stable Genomic Findings For a complete list of the genes assayed, please refer to the Appendix. KEAP1 I482f*17 [id number] RB1 splice site [id number]>[id number]* 8 Disease relevant genes with no reportable alterations: ALK, BRAF, EGFR, ERBB2, KRAS, MET, RET, ROS1  PDL1 Expression: 1 %  PRIOR THERAPY:  1) status post left upper lobectomy with lymph node dissection on December 16, 2019 under the care of Dr. LKipp Brood  The tumor size measured 0.9 cm but there was involvement of the level 10 L and 11 L.  2) Adjuvant systemic chemotherapy with cisplatin 75 mg/M2 and Alimta 500 mg/M2 every 3 weeks.  First dose February 04, 2020.  Status post 4 cycles.  CURRENT THERAPY: Neoadjuvant treatment with imatinib 400 mg p.o. daily for the GIST tumor of the pelvic area.  First dose started July 18, 2020.  Status post 4.5 months of treatment.   INTERVAL HISTORY: Jesse REICHENBERGER70y.o. male returns to the clinic today for follow-up visit.  The patient is feeling fine today with no concerning complaints except for mild fatigue.  He continues to tolerate his treatment with Gleevec fairly well.  He denied having any current chest pain, shortness of breath, cough or hemoptysis.  He denied having any fever or chills.  He has no nausea, vomiting, diarrhea or constipation.  He has no headache or visual changes.  He is here today for evaluation and repeat blood work.  MEDICAL HISTORY: Past Medical History:  Diagnosis Date   Anemia    [place]cer [place]    bladder cancer   COPD (chronic obstructive pulmonary  disease) ([facility]    mild    DIVERTICULOSIS, COLON 01/14/2007   Qualifier: Diagnosis of  By: MPaulina FusiRN, JDaine Gravel   GASTRITIS, CHRONIC 06/01/2004   Qualifier: Diagnosis of  By: SNolon RodCMA (AAMA), Robin     GERD 01/09/2007   Qualifier: Diagnosis of  By: TSherren Mocha RN, Ellen     Heart murmur    as a child; no murmur heard 12/14/19   HYPERLIPIDEMIA 01/09/2007   Qualifier: Diagnosis of  By: TSherren MochaRN, EDorian Pod    Hypertension    Lung mass    left upper nodule   Macular degeneration    right eye   NEOPLASM, MALIGNANT, BLADDER, HX OF 2002   Qualifier: Diagnosis of  By: SNolon RodCMA (AAMA), Robin  / no chemo or radiation   OSTEOARTHRITIS 01/14/2007   Qualifier: Diagnosis of  By: MPaulina Fusi RN, JDaine Gravel- knees   Rectal mass    Reflux esophagitis 06/01/2004   Qualifier: Diagnosis of  By: SNolon RodCMA (AAMA), Robin     TOBACCO USER 02/16/2009   Qualifier: Diagnosis of  By: KBurnice Logan MD, PDoretha Sou   VITAMIN D DEFICIENCY 06/03/2007   Qualifier: Diagnosis of  By: KBurnice Logan MD, PDoretha Sou    ALLERGIES:  has No Known Allergies.  MEDICATIONS:  Current Outpatient Medications  Medication Sig Dispense Refill   amLODipine (NORVASC) 5 MG tablet Take 5 mg by mouth daily.     atorvastatin (LIPITOR) 40 MG tablet Take 20 mg by  mouth daily.     diphenhydrAMINE (BENADRYL) 25 mg capsule Take 1-2 capsules (25-50 mg total) by mouth at bedtime as needed for sleep. 30 capsule 0   imatinib (GLEEVEC) 400 MG tablet Take 1 tablet (400 mg total) by mouth daily. Take with meals and large glass of water.Caution:Chemotherapy. 30 tablet 3   omeprazole (PRILOSEC) 40 MG capsule Take 1 capsule (40 mg total) by mouth daily. (Patient taking differently: Take 40 mg by mouth daily before breakfast.) 90 capsule 3   Vitamin D, Ergocalciferol, (DRISDOL) 1.25 MG (50000 UNIT) CAPS capsule Take 1 capsule (50,000 Units total) by mouth every 7 (seven) days. 12 capsule 0   No current facility-administered medications for this visit.     SURGICAL HISTORY:  Past Surgical History:  Procedure Laterality Date   BRONCHIAL BIOPSY  10/13/2019   Procedure: BRONCHIAL BIOPSIES;  Surgeon: Garner Nash, DO;  Location: [facility];  Service: Pulmonary;;   BRONCHIAL BRUSHINGS  10/13/2019   Procedure: BRONCHIAL BRUSHINGS;  Surgeon: Garner Nash, DO;  Location: [facility];  Service: Pulmonary;;   BRONCHIAL NEEDLE ASPIRATION BIOPSY  10/13/2019   Procedure: BRONCHIAL NEEDLE ASPIRATION BIOPSIES;  Surgeon: Garner Nash, DO;  Location: [facility];  Service: Pulmonary;;   BRONCHIAL WASHINGS  10/13/2019   Procedure: BRONCHIAL WASHINGS;  Surgeon: Garner Nash, DO;  Location: [facility];  Service: Pulmonary;;   COLONOSCOPY     multiple   INTERCOSTAL NERVE BLOCK Left 12/16/2019   Procedure: INTERCOSTAL NERVE BLOCK;  Surgeon: Lajuana Matte, MD;  Location: [facility];  Service: Thoracic;  Laterality: Left;   KNEE SURGERY  07/20/2008   bilat.   NASAL SEPTUM SURGERY  1995   NODE DISSECTION Left 12/16/2019   Procedure: NODE DISSECTION;  Surgeon: Lajuana Matte, MD;  Location: [facility];  Service: Thoracic;  Laterality: Left;   TONSILLECTOMY     TRANSURETHRAL RESECTION OF BLADDER TUMOR  2002   UPPER GASTROINTESTINAL ENDOSCOPY     VIDEO BRONCHOSCOPY WITH ENDOBRONCHIAL NAVIGATION N/A 10/13/2019   Procedure: VIDEO BRONCHOSCOPY WITH ENDOBRONCHIAL NAVIGATION;  Surgeon: Garner Nash, DO;  Location: [facility];  Service: Pulmonary;  Laterality: N/A;   WISDOM TOOTH EXTRACTION      REVIEW OF SYSTEMS:  A comprehensive review of systems was negative except for: Constitutional: positive for fatigue   PHYSICAL EXAMINATION: General appearance: alert, cooperative, fatigued, and no distress Head: Normocephalic, without obvious abnormality, atraumatic Neck: no adenopathy, no JVD, supple, symmetrical, trachea midline, and thyroid not enlarged, symmetric, no tenderness/mass/nodules Lymph nodes: Cervical, supraclavicular, and axillary  nodes normal. Resp: clear to auscultation bilaterally Back: symmetric, no curvature. ROM normal. No CVA tenderness. Cardio: regular rate and rhythm, S1, S2 normal, no murmur, click, rub or gallop GI: soft, non-tender; bowel sounds normal; no masses,  no organomegaly Extremities: extremities normal, atraumatic, no cyanosis or edema  ECOG PERFORMANCE STATUS: 1 - Symptomatic but completely ambulatory  Blood pressure (!) 141/81, pulse 81, temperature 98.1 F (36.7 C), temperature source Oral, resp. rate 17, weight 174 lb 8 oz (79.2 kg), SpO2 100 %.  LABORATORY DATA: Lab Results  Component Value Date   WBC 4.7 11/22/2020   HGB 11.8 (L) 11/22/2020   HCT 33.9 (L) 11/22/2020   MCV 100.9 (H) 11/22/2020   PLT 125 (L) 11/22/2020      Chemistry      Component Value Date/Time   NA 142 10/10/2020 0736   K 4.0 10/10/2020 0736   CL 109 10/10/2020 0736   CO2 24 10/10/2020 0736  BUN 24 (H) 10/10/2020 0736   CREATININE 0.86 10/10/2020 0736      Component Value Date/Time   CALCIUM 9.2 10/10/2020 0736   ALKPHOS 136 (H) 10/10/2020 0736   AST 26 10/10/2020 0736   ALT 16 10/10/2020 0736   BILITOT 0.5 10/10/2020 0736       RADIOGRAPHIC STUDIES: No results found.  ASSESSMENT AND PLAN: This is a very pleasant 70 years old white male recently diagnosed with a stage IIb (T1 a, N1, M0) non-small cell lung cancer, adenocarcinoma in June 2021 status post left upper lobectomy with lymph node dissection on December 16, 2019 under the care of Dr. Kipp Brood.  The patient has no actionable mutations and PD-L1 expression was 1%. He was also diagnosed with pelvic GIST tumor. The patient completed a course of adjuvant systemic chemotherapy with cisplatin 75 mg/M2 and Alimta 500 mg/M2 every 3 weeks status post 4 cycles.  He tolerated his treatment well except for fatigue and occasional nausea. He is currently on observation for the lung cancer. For the pelvic GIST tumor, the patient is currently on  neoadjuvant treatment with imatinib 400 mg p.o. daily.  Status post 4.5 months.   The patient has been tolerating this treatment well with no concerning adverse effects. I recommended for him to continue his current treatment with Gleevec with the same dose. I will see the patient back for follow-up visit in 6 weeks for evaluation with repeat CT scan of the abdomen and pelvis for restaging of his disease. If the patient continues to have good response to this treatment, I will refer him back to surgery for evaluation of resection. For the anemia, the patient was advised to take multivitamins as well as iron tablets. For the hypertension, he will continue to monitor it closely at home and discussed with his primary care physician for any changes in his medication if needed. He was advised to call immediately if he has any other concerning symptoms in the interval.  The patient voices understanding of current disease status and treatment options and is in agreement with the current care plan.  All questions were answered. The patient knows to call the clinic with any problems, questions or concerns. We can certainly see the patient much sooner if necessary.   Disclaimer: This note was dictated with voice recognition software. Similar sounding words can inadvertently be transcribed and may not be corrected upon review.

## 2020-11-23 ENCOUNTER — Other Ambulatory Visit: Payer: Self-pay

## 2020-11-23 ENCOUNTER — Other Ambulatory Visit (HOSPITAL_COMMUNITY): Payer: Self-pay

## 2020-11-23 DIAGNOSIS — K219 Gastro-esophageal reflux disease without esophagitis: Secondary | ICD-10-CM

## 2020-11-23 MED ORDER — OMEPRAZOLE 40 MG PO CPDR
40.0000 mg | DELAYED_RELEASE_CAPSULE | Freq: Every day | ORAL | 3 refills | Status: DC
  Filled 2020-11-23: qty 90, 90d supply, fill #0

## 2020-11-24 ENCOUNTER — Other Ambulatory Visit: Payer: Self-pay

## 2020-11-24 ENCOUNTER — Telehealth: Payer: Self-pay | Admitting: Internal Medicine

## 2020-11-24 ENCOUNTER — Other Ambulatory Visit (HOSPITAL_COMMUNITY): Payer: Self-pay

## 2020-11-24 DIAGNOSIS — I10 Essential (primary) hypertension: Secondary | ICD-10-CM

## 2020-11-24 MED ORDER — AMLODIPINE BESYLATE 5 MG PO TABS
5.0000 mg | ORAL_TABLET | Freq: Every day | ORAL | 1 refills | Status: DC
  Filled 2020-11-24: qty 90, 90d supply, fill #0

## 2020-11-24 MED ORDER — ATORVASTATIN CALCIUM 40 MG PO TABS
20.0000 mg | ORAL_TABLET | Freq: Every day | ORAL | 1 refills | Status: DC
  Filled 2020-11-24: qty 45, 90d supply, fill #0

## 2020-11-24 NOTE — Telephone Encounter (Signed)
Scheduled per los. Called, not able to leave msg.

## 2020-11-24 NOTE — Telephone Encounter (Signed)
Refills sent

## 2020-11-25 ENCOUNTER — Telehealth: Payer: Self-pay

## 2020-11-25 DIAGNOSIS — I10 Essential (primary) hypertension: Secondary | ICD-10-CM

## 2020-11-25 MED ORDER — AMLODIPINE BESYLATE 5 MG PO TABS: 5.0000 mg | ORAL_TABLET | Freq: Every day | ORAL | 1 refills | Status: DC

## 2020-11-25 MED ORDER — ATORVASTATIN CALCIUM 40 MG PO TABS: 20.0000 mg | ORAL_TABLET | Freq: Every day | ORAL | 1 refills | Status: DC

## 2020-11-25 NOTE — Telephone Encounter (Signed)
Rx's resent.  

## 2020-11-25 NOTE — Addendum Note (Signed)
Addended by: Nathanial Millman E on: 11/25/2020 04:00 PM   Modules accepted: Orders

## 2020-11-25 NOTE — Telephone Encounter (Signed)
Patient called stating his Rx was sent to the wrong pharmacy and would like Rx sent [facility]

## 2020-11-26 ENCOUNTER — Other Ambulatory Visit (HOSPITAL_COMMUNITY): Payer: Self-pay

## 2020-11-28 ENCOUNTER — Telehealth: Payer: Self-pay

## 2020-11-28 ENCOUNTER — Other Ambulatory Visit (HOSPITAL_COMMUNITY): Payer: Self-pay

## 2020-11-28 ENCOUNTER — Other Ambulatory Visit: Payer: Self-pay

## 2020-11-28 DIAGNOSIS — K219 Gastro-esophageal reflux disease without esophagitis: Secondary | ICD-10-CM

## 2020-11-28 MED ORDER — OMEPRAZOLE 40 MG PO CPDR: 40.0000 mg | DELAYED_RELEASE_CAPSULE | Freq: Every day | ORAL | 3 refills | Status: DC

## 2020-11-28 NOTE — Telephone Encounter (Signed)
Pt called this morning requesting refill for his Omeprazole, pt state that Rx was sent to wrong pharmacy. Rx was resent to correct pharmacy, Previous Rx cancelled with [facility].

## 2020-12-30 ENCOUNTER — Ambulatory Visit (HOSPITAL_COMMUNITY)
Admission: RE | Admit: 2020-12-30 | Discharge: 2020-12-30 | Disposition: A | Discharge status: Home / Self Care | Payer: Medicare Other | Source: Ambulatory Visit | Attending: Internal Medicine | Admitting: Internal Medicine

## 2020-12-30 ENCOUNTER — Inpatient Hospital Stay: Payer: Medicare Other | Attending: Internal Medicine

## 2020-12-30 ENCOUNTER — Other Ambulatory Visit: Payer: Self-pay

## 2020-12-30 ENCOUNTER — Encounter (HOSPITAL_COMMUNITY): Payer: Self-pay

## 2020-12-30 DIAGNOSIS — C49A9 Gastrointestinal stromal tumor of other sites: Secondary | ICD-10-CM | POA: Insufficient documentation

## 2020-12-30 DIAGNOSIS — Z79899 Other long term (current) drug therapy: Secondary | ICD-10-CM | POA: Diagnosis not present

## 2020-12-30 DIAGNOSIS — C49A5 Gastrointestinal stromal tumor of rectum: Secondary | ICD-10-CM

## 2020-12-30 DIAGNOSIS — E785 Hyperlipidemia, unspecified: Secondary | ICD-10-CM | POA: Insufficient documentation

## 2020-12-30 DIAGNOSIS — C3412 Malignant neoplasm of upper lobe, left bronchus or lung: Secondary | ICD-10-CM | POA: Insufficient documentation

## 2020-12-30 DIAGNOSIS — I10 Essential (primary) hypertension: Secondary | ICD-10-CM | POA: Diagnosis not present

## 2020-12-30 DIAGNOSIS — N200 Calculus of kidney: Secondary | ICD-10-CM | POA: Diagnosis not present

## 2020-12-30 DIAGNOSIS — D649 Anemia, unspecified: Secondary | ICD-10-CM | POA: Diagnosis not present

## 2020-12-30 DIAGNOSIS — N400 Enlarged prostate without lower urinary tract symptoms: Secondary | ICD-10-CM | POA: Diagnosis not present

## 2020-12-30 DIAGNOSIS — K7689 Other specified diseases of liver: Secondary | ICD-10-CM | POA: Diagnosis not present

## 2020-12-30 DIAGNOSIS — C49A0 Gastrointestinal stromal tumor, unspecified site: Secondary | ICD-10-CM | POA: Diagnosis not present

## 2020-12-30 LAB — CBC WITH DIFFERENTIAL (CANCER CENTER ONLY)
Abs Immature Granulocytes: 0.01 10*3/uL (ref 0.00–0.07)
Basophils Absolute: 0 10*3/uL (ref 0.0–0.1)
Basophils Relative: 1 %
Eosinophils Absolute: 0.1 10*3/uL (ref 0.0–0.5)
Eosinophils Relative: 3 %
HCT: 34.1 % — ABNORMAL LOW (ref 39.0–52.0)
Hemoglobin: 12 g/dL — ABNORMAL LOW (ref 13.0–17.0)
Immature Granulocytes: 0 %
Lymphocytes Relative: 27 %
Lymphs Abs: 1.2 10*3/uL (ref 0.7–4.0)
MCH: 35.2 pg — ABNORMAL HIGH (ref 26.0–34.0)
MCHC: 35.2 g/dL (ref 30.0–36.0)
MCV: 100 fL (ref 80.0–100.0)
Monocytes Absolute: 0.2 10*3/uL (ref 0.1–1.0)
Monocytes Relative: 5 %
Neutro Abs: 2.9 10*3/uL (ref 1.7–7.7)
Neutrophils Relative %: 64 %
Platelet Count: 132 10*3/uL — ABNORMAL LOW (ref 150–400)
RBC: 3.41 MIL/uL — ABNORMAL LOW (ref 4.22–5.81)
RDW: 13.6 % (ref 11.5–15.5)
WBC Count: 4.4 10*3/uL (ref 4.0–10.5)
nRBC: 0 % (ref 0.0–0.2)

## 2020-12-30 LAB — CMP (CANCER CENTER ONLY)
ALT: 21 U/L (ref 0–44)
AST: 28 U/L (ref 15–41)
Albumin: 4.1 g/dL (ref 3.5–5.0)
Alkaline Phosphatase: 107 U/L (ref 38–126)
Anion gap: 9 (ref 5–15)
BUN: 23 mg/dL (ref 8–23)
CO2: 26 mmol/L (ref 22–32)
Calcium: 9.2 mg/dL (ref 8.9–10.3)
Chloride: 108 mmol/L (ref 98–111)
Creatinine: 0.83 mg/dL (ref 0.61–1.24)
GFR, Estimated: >60 mL/min (ref >60)
Glucose, Bld: 105 mg/dL — ABNORMAL HIGH (ref 70–99)
Potassium: 3.9 mmol/L (ref 3.5–5.1)
Sodium: 143 mmol/L (ref 135–145)
Total Bilirubin: 0.8 mg/dL (ref 0.3–1.2)
Total Protein: 6.6 g/dL (ref 6.5–8.1)

## 2020-12-30 MED ORDER — IOHEXOL 350 MG/ML SOLN
80.0000 mL | Freq: Once | INTRAVENOUS | Status: AC | PRN
  Administered 2020-12-30: 80 mL via INTRAVENOUS

## 2021-01-02 ENCOUNTER — Inpatient Hospital Stay: Payer: Medicare Other | Admitting: Internal Medicine

## 2021-01-02 ENCOUNTER — Other Ambulatory Visit: Payer: Self-pay

## 2021-01-02 VITALS — BP 131/64 | HR 67 | Temp 97.5°F | Resp 18 | Ht 69.0 in | Wt 174.2 lb

## 2021-01-02 DIAGNOSIS — C3412 Malignant neoplasm of upper lobe, left bronchus or lung: Secondary | ICD-10-CM | POA: Diagnosis not present

## 2021-01-02 DIAGNOSIS — E785 Hyperlipidemia, unspecified: Secondary | ICD-10-CM | POA: Diagnosis not present

## 2021-01-02 DIAGNOSIS — C49A9 Gastrointestinal stromal tumor of other sites: Secondary | ICD-10-CM | POA: Diagnosis not present

## 2021-01-02 DIAGNOSIS — D649 Anemia, unspecified: Secondary | ICD-10-CM | POA: Diagnosis not present

## 2021-01-02 DIAGNOSIS — C3492 Malignant neoplasm of unspecified part of left bronchus or lung: Secondary | ICD-10-CM

## 2021-01-02 DIAGNOSIS — Z79899 Other long term (current) drug therapy: Secondary | ICD-10-CM | POA: Diagnosis not present

## 2021-01-02 DIAGNOSIS — I10 Essential (primary) hypertension: Secondary | ICD-10-CM | POA: Diagnosis not present

## 2021-01-02 NOTE — Progress Notes (Signed)
[place] [phone]   Fax:(336) [phone]  OFFICE PROGRESS NOTE  [name], MD [place] [address]  DIAGNOSIS:  1) Stage IIB (T1 a, N1, M0) non-small cell lung cancer, adenocarcinoma diagnosed in June 2021. 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  Biomarker Findings Tumor Mutational Burden - 20 Muts/Mb Microsatellite status - MS-Stable Genomic Findings For a complete list of the genes assayed, please refer to the Appendix. KEAP1 I428f*17 [id number] RB1 splice site [id number]>[id number]* 8 Disease relevant genes with no reportable alterations: ALK, BRAF, EGFR, ERBB2, KRAS, MET, RET, ROS1  PDL1 Expression: 1 %  PRIOR THERAPY:  1) status post left upper lobectomy with lymph node dissection on December 16, 2019 under the care of Dr. LKipp Brood  The tumor size measured 0.9 cm but there was involvement of the level 10 L and 11 L.  2) Adjuvant systemic chemotherapy with cisplatin 75 mg/M2 and Alimta 500 mg/M2 every 3 weeks.  First dose February 04, 2020.  Status post 4 cycles.  CURRENT THERAPY: Neoadjuvant treatment with imatinib 400 mg p.o. daily for the GIST tumor of the pelvic area.  First dose started July 18, 2020.  Status post 6 months of treatment.   INTERVAL HISTORY: JMAUREEN DUESING89y.o. male returns to the clinic today for follow-up visit.  The patient is feeling fine today with no concerning complaints except for mild fatigue.  He continues to tolerate his treatment with Gleevec fairly well.  He denied having any current chest pain, shortness of breath, cough or hemoptysis.  He has no nausea, vomiting, diarrhea or constipation.  He has no headache or visual changes.  He has no recent weight loss or night sweats.  He had repeat CT scan of the abdomen and pelvis performed recently and is here for evaluation and discussion of his scan results.  MEDICAL HISTORY: Past Medical History:  Diagnosis Date    Anemia    [place]cer ([facility]    bladder cancer   COPD ([place] pulmonary disease) ([facility]    mild    DIVERTICULOSIS, COLON 01/14/2007   Qualifier: Diagnosis of  By: MPaulina FusiRN, JDaine Gravel   GASTRITIS, CHRONIC 06/01/2004   Qualifier: Diagnosis of  By: SNolon RodCMA (AAMA), Robin     GERD 01/09/2007   Qualifier: Diagnosis of  By: TSherren Mocha RN, Ellen     Heart murmur    as a child; no murmur heard 12/14/19   HYPERLIPIDEMIA 01/09/2007   Qualifier: Diagnosis of  By: TSherren MochaRN, EDorian Pod    Hypertension    Lung mass    left upper nodule   Macular degeneration    right eye   NEOPLASM, MALIGNANT, BLADDER, HX OF 2002   Qualifier: Diagnosis of  By: SNolon RodCMA (AAMA), Robin  / no chemo or radiation   OSTEOARTHRITIS 01/14/2007   Qualifier: Diagnosis of  By: MPaulina Fusi RN, JDaine Gravel- knees   Rectal mass    Reflux esophagitis 06/01/2004   Qualifier: Diagnosis of  By: SNolon RodCMA (AAMA), Robin     TOBACCO USER 02/16/2009   Qualifier: Diagnosis of  By: KBurnice Logan MD, PDoretha Sou   VITAMIN D DEFICIENCY 06/03/2007   Qualifier: Diagnosis of  By: KBurnice Logan MD, PDoretha Sou    ALLERGIES:  has No Known Allergies.  MEDICATIONS:  Current Outpatient Medications  Medication Sig Dispense Refill   amLODipine (NORVASC) 5 MG tablet Take 1 tablet (5  mg total) by mouth daily. 90 tablet 1   atorvastatin (LIPITOR) 40 MG tablet Take 1/2 tablet (20 mg total) by mouth daily. 90 tablet 1   diphenhydrAMINE (BENADRYL) 25 mg capsule Take 1-2 capsules (25-50 mg total) by mouth at bedtime as needed for sleep. 30 capsule 0   imatinib (GLEEVEC) 400 MG tablet Take 1 tablet (400 mg total) by mouth daily. Take with meals and large glass of water.Caution:Chemotherapy. 30 tablet 3   omeprazole (PRILOSEC) 40 MG capsule Take 1 capsule (40 mg total) by mouth daily. 90 capsule 3   Vitamin D, Ergocalciferol, (DRISDOL) 1.25 MG (50000 UNIT) CAPS capsule Take 1 capsule (50,000 Units total) by mouth every 7 (seven) days. 12 capsule 0   No  current facility-administered medications for this visit.    SURGICAL HISTORY:  Past Surgical History:  Procedure Laterality Date   BRONCHIAL BIOPSY  10/13/2019   Procedure: BRONCHIAL BIOPSIES;  Surgeon: Garner Nash, DO;  Location: [facility];  Service: Pulmonary;;   BRONCHIAL BRUSHINGS  10/13/2019   Procedure: BRONCHIAL BRUSHINGS;  Surgeon: Garner Nash, DO;  Location: [facility];  Service: Pulmonary;;   BRONCHIAL NEEDLE ASPIRATION BIOPSY  10/13/2019   Procedure: BRONCHIAL NEEDLE ASPIRATION BIOPSIES;  Surgeon: Garner Nash, DO;  Location: [facility];  Service: Pulmonary;;   BRONCHIAL WASHINGS  10/13/2019   Procedure: BRONCHIAL WASHINGS;  Surgeon: Garner Nash, DO;  Location: [facility];  Service: Pulmonary;;   COLONOSCOPY     multiple   INTERCOSTAL NERVE BLOCK Left 12/16/2019   Procedure: INTERCOSTAL NERVE BLOCK;  Surgeon: Lajuana Matte, MD;  Location: [facility];  Service: Thoracic;  Laterality: Left;   KNEE SURGERY  07/20/2008   bilat.   NASAL SEPTUM SURGERY  1995   NODE DISSECTION Left 12/16/2019   Procedure: NODE DISSECTION;  Surgeon: Lajuana Matte, MD;  Location: [facility];  Service: Thoracic;  Laterality: Left;   TONSILLECTOMY     TRANSURETHRAL RESECTION OF BLADDER TUMOR  2002   UPPER GASTROINTESTINAL ENDOSCOPY     VIDEO BRONCHOSCOPY WITH ENDOBRONCHIAL NAVIGATION N/A 10/13/2019   Procedure: VIDEO BRONCHOSCOPY WITH ENDOBRONCHIAL NAVIGATION;  Surgeon: Garner Nash, DO;  Location: [facility];  Service: Pulmonary;  Laterality: N/A;   WISDOM TOOTH EXTRACTION      REVIEW OF SYSTEMS:  Constitutional: positive for fatigue Eyes: negative Ears, nose, mouth, throat, and face: negative Respiratory: negative Cardiovascular: negative Gastrointestinal: negative Genitourinary:negative Integument/breast: negative Hematologic/lymphatic: negative Musculoskeletal:negative Neurological: negative Behavioral/Psych: negative Endocrine:  negative Allergic/Immunologic: negative   PHYSICAL EXAMINATION: General appearance: alert, cooperative, fatigued, and no distress Head: Normocephalic, without obvious abnormality, atraumatic Neck: no adenopathy, no JVD, supple, symmetrical, trachea midline, and thyroid not enlarged, symmetric, no tenderness/mass/nodules Lymph nodes: Cervical, supraclavicular, and axillary nodes normal. Resp: clear to auscultation bilaterally Back: symmetric, no curvature. ROM normal. No CVA tenderness. Cardio: regular rate and rhythm, S1, S2 normal, no murmur, click, rub or gallop GI: soft, non-tender; bowel sounds normal; no masses,  no organomegaly Extremities: extremities normal, atraumatic, no cyanosis or edema Neurologic: Alert and oriented X 3, normal strength and tone. Normal symmetric reflexes. Normal coordination and gait  ECOG PERFORMANCE STATUS: 1 - Symptomatic but completely ambulatory  Blood pressure 131/64, pulse 67, temperature (!) 97.5 F (36.4 C), temperature source Tympanic, resp. rate 18, height '5\' 9"'  (1.753 m), weight 174 lb 3.2 oz (79 kg), SpO2 100 %.  LABORATORY DATA: Lab Results  Component Value Date   WBC 4.4 12/30/2020   HGB 12.0 (L) 12/30/2020  HCT 34.1 (L) 12/30/2020   MCV 100.0 12/30/2020   PLT 132 (L) 12/30/2020      Chemistry      Component Value Date/Time   NA 143 12/30/2020 0914   K 3.9 12/30/2020 0914   CL 108 12/30/2020 0914   CO2 26 12/30/2020 0914   BUN 23 12/30/2020 0914   CREATININE 0.83 12/30/2020 0914      Component Value Date/Time   CALCIUM 9.2 12/30/2020 0914   ALKPHOS 107 12/30/2020 0914   AST 28 12/30/2020 0914   ALT 21 12/30/2020 0914   BILITOT 0.8 12/30/2020 0914       RADIOGRAPHIC STUDIES: CT Abdomen Pelvis W Contrast  Result Date: 01/01/2021 CLINICAL DATA:  GI stromal tumor.  Restaging. EXAM: CT ABDOMEN AND PELVIS WITH CONTRAST TECHNIQUE: Multidetector CT imaging of the abdomen and pelvis was performed using the standard protocol  following bolus administration of intravenous contrast. CONTRAST:  72m OMNIPAQUE IOHEXOL 350 MG/ML SOLN COMPARISON:  10/11/2020 FINDINGS: Lower chest: Unremarkable. Hepatobiliary: No suspicious focal abnormality within the liver parenchyma. Heterogeneous liver parenchyma with nodular contour is compatible with cirrhosis. There is no evidence for gallstones, gallbladder wall thickening, or pericholecystic fluid. No intrahepatic or extrahepatic biliary dilation. Pancreas: No focal mass lesion. No dilatation of the main duct. No intraparenchymal cyst. No peripancreatic edema. Spleen: No splenomegaly. No focal mass lesion. Adrenals/Urinary Tract: No adrenal nodule or mass. 2 mm nonobstructing stone noted lower pole right kidney. No stone disease in the left kidney. No evidence for renal mass lesion or hydronephrosis. Haziness in the perinephric fat bilaterally is stable suggesting nonacute etiology. No evidence for hydroureter. The urinary bladder appears normal for the degree of distention. Stomach/Bowel: Stomach is unremarkable. No gastric wall thickening. No evidence of outlet obstruction. Duodenum is normally positioned as is the ligament of Treitz. No small bowel wall thickening. No small bowel dilatation. The terminal ileum is normal. The appendix is normal. No gross colonic mass. No colonic wall thickening. Diverticular changes are noted in the left colon without evidence of diverticulitis. Vascular/Lymphatic: There is moderate atherosclerotic calcification of the abdominal aorta without aneurysm. There is no gastrohepatic or hepatoduodenal ligament lymphadenopathy. No retroperitoneal or mesenteric lymphadenopathy. No pelvic sidewall lymphadenopathy. Reproductive: Prostate gland is enlarged. Mass lesion wedged between the prostate gland in anterolateral right rectum is similar measuring 4.2 x 3.3 cm today compared to 3.7 x 3.7 cm previously. Other: No intraperitoneal free fluid. Musculoskeletal: No worrisome  lytic or sclerotic osseous abnormality. IMPRESSION: 1. Stable exam. No new or progressive interval findings. 2. Mass lesion wedged between the prostate gland and anterolateral right rectum is similar to prior. 3. Liver appearance suggests cirrhosis. 4. 2 mm nonobstructing stone lower pole right kidney. 5. Aortic Atherosclerosis (ICD10-I70.0). Electronically Signed   By: EMisty StanleyM.D.   On: 01/01/2021 09:10    ASSESSMENT AND PLAN: This is a very pleasant 70years old white male recently diagnosed with a stage IIb (T1 a, N1, M0) non-small cell lung cancer, adenocarcinoma in June 2021 status post left upper lobectomy with lymph node dissection on December 16, 2019 under the care of Dr. LKipp Brood  The patient has no actionable mutations and PD-L1 expression was 1%. He was also diagnosed with pelvic GIST tumor. The patient completed a course of adjuvant systemic chemotherapy with cisplatin 75 mg/M2 and Alimta 500 mg/M2 every 3 weeks status post 4 cycles.  He tolerated his treatment well except for fatigue and occasional nausea. He is currently on observation for the lung cancer.  For the pelvic GIST tumor, the patient is currently on neoadjuvant treatment with imatinib 400 mg p.o. daily.  Status post 6 months.   He has been tolerating his treatment with imatinib fairly well with no concerning adverse effects except for mild fatigue. He had repeat CT scan of the abdomen pelvis performed recently.  I personally and independently reviewed the scan images and discussed the results with the patient and showed him the images today. His scan showed no concerning findings for disease progression and the size of the pelvic mass is similar to the previous scan 3 months ago. The patient also concerned about the finding and the report of suspicious liver cirrhosis.  I explained to the patient that the heterogeneity described on the current scan was similar to previous scans that was not directly called liver  cirrhosis at that time. I advised him to cut the amount of alcohol he is drinking at regular basis. Regarding the GIST tumor, I will discuss with Dr. Johney Maine seeing the patient sooner for consideration of surgical resection since he did not have any further improvement in the size of the tumor with imatinib since his last scan 3 months ago.  The patient may need repeat MRI of the pelvis before the surgical resection. I will see him back for follow-up visit in 6 weeks for evaluation with repeat blood work.  He was advised to continue his current treatment with imatinib for now. For the anemia, he will continue his current treatment with oral iron tablets and multivitamins. For the hypertension the patient will continue with his current blood pressure medication.  His blood pressure is better controlled today. He was advised to call immediately if he has any other concerning symptoms in the interval. The patient voices understanding of current disease status and treatment options and is in agreement with the current care plan.  All questions were answered. The patient knows to call the clinic with any problems, questions or concerns. We can certainly see the patient much sooner if necessary.   Disclaimer: This note was dictated with voice recognition software. Similar sounding words can inadvertently be transcribed and may not be corrected upon review.

## 2021-01-03 ENCOUNTER — Telehealth: Payer: Self-pay | Admitting: Internal Medicine

## 2021-01-03 NOTE — Telephone Encounter (Signed)
Scheduled appt per 9/19 los - called patient and left message. Mailed letter with appt date and time

## 2021-01-05 ENCOUNTER — Telehealth: Payer: Self-pay | Admitting: Family Medicine

## 2021-01-05 NOTE — Chronic Care Management (AMB) (Signed)
  Chronic Care Management   Outreach Note  01/05/2021 Name: Jesse [name] MRN: [id number] DOB: 1950-09-14  Referred by: [name], MD Reason for referral : Chronic Care Management   An unsuccessful telephone outreach was attempted today. The patient was referred to the pharmacist for assistance with care management and care coordination.   Follow Up Plan:   Tatjana Dellinger Upstream Scheduler

## 2021-01-09 ENCOUNTER — Telehealth: Payer: Self-pay | Admitting: Family Medicine

## 2021-01-09 NOTE — Progress Notes (Signed)
.  [facility] Chronic Care Management   Note  01/09/2021 Name: BLISS TSANG MRN: [id number] DOB: 02/19/1951  WILBERTH DAMON is a 70 y.o. year old male who is a primary care patient of Volanda Napoleon Langley Adie, MD. I reached out to [name] by phone today in response to a referral sent by Mr. Jaylan Hinojosa Mroczkowski's PCP, [name], MD.   Mr. Kutzer was given information about Chronic Care Management services today including:  CCM service includes personalized support from designated clinical staff supervised by his physician, including individualized plan of care and coordination with other care providers 24/7 contact phone numbers for assistance for urgent and routine care needs. Service will only be billed when office clinical staff spend 20 minutes or more in a month to coordinate care. Only one practitioner may furnish and bill the service in a calendar month. The patient may stop CCM services at any time (effective at the end of the month) by phone call to the office staff.   Patient agreed to services and verbal consent obtained.   Follow up plan:   Tatjana Secretary/administrator

## 2021-02-08 ENCOUNTER — Other Ambulatory Visit (HOSPITAL_COMMUNITY): Payer: Self-pay

## 2021-02-08 ENCOUNTER — Other Ambulatory Visit: Payer: Self-pay | Admitting: Medical Oncology

## 2021-02-08 DIAGNOSIS — C49A5 Gastrointestinal stromal tumor of rectum: Secondary | ICD-10-CM

## 2021-02-08 MED ORDER — IMATINIB MESYLATE 400 MG PO TABS
400.0000 mg | ORAL_TABLET | Freq: Every day | ORAL | 3 refills | Status: DC
  Filled 2021-02-08 (×2): qty 30, 30d supply, fill #0
  Filled 2021-03-02: qty 30, 30d supply, fill #1
  Filled 2021-04-04: qty 30, 30d supply, fill #2
  Filled 2021-05-10: qty 30, 30d supply, fill #3

## 2021-02-08 NOTE — Telephone Encounter (Signed)
Refill Gleevec requested. Please send to [facility] as he thinks it may be less expensive than [facility].

## 2021-02-09 ENCOUNTER — Other Ambulatory Visit (HOSPITAL_COMMUNITY): Payer: Self-pay

## 2021-02-13 ENCOUNTER — Inpatient Hospital Stay (HOSPITAL_BASED_OUTPATIENT_CLINIC_OR_DEPARTMENT_OTHER): Payer: Medicare Other

## 2021-02-13 ENCOUNTER — Other Ambulatory Visit: Payer: Self-pay

## 2021-02-13 ENCOUNTER — Inpatient Hospital Stay: Payer: Medicare Other | Attending: Internal Medicine | Admitting: Internal Medicine

## 2021-02-13 VITALS — BP 125/71 | HR 81 | Temp 97.1°F | Resp 20 | Ht 69.0 in | Wt 174.1 lb

## 2021-02-13 DIAGNOSIS — C49A5 Gastrointestinal stromal tumor of rectum: Secondary | ICD-10-CM

## 2021-02-13 DIAGNOSIS — C3492 Malignant neoplasm of unspecified part of left bronchus or lung: Secondary | ICD-10-CM

## 2021-02-13 DIAGNOSIS — C3490 Malignant neoplasm of unspecified part of unspecified bronchus or lung: Secondary | ICD-10-CM

## 2021-02-13 DIAGNOSIS — Z5111 Encounter for antineoplastic chemotherapy: Secondary | ICD-10-CM

## 2021-02-13 DIAGNOSIS — Z79899 Other long term (current) drug therapy: Secondary | ICD-10-CM | POA: Diagnosis not present

## 2021-02-13 DIAGNOSIS — C49A9 Gastrointestinal stromal tumor of other sites: Secondary | ICD-10-CM | POA: Insufficient documentation

## 2021-02-13 DIAGNOSIS — C3412 Malignant neoplasm of upper lobe, left bronchus or lung: Secondary | ICD-10-CM | POA: Diagnosis not present

## 2021-02-13 DIAGNOSIS — D649 Anemia, unspecified: Secondary | ICD-10-CM | POA: Diagnosis not present

## 2021-02-13 DIAGNOSIS — Z8551 Personal history of malignant neoplasm of bladder: Secondary | ICD-10-CM | POA: Insufficient documentation

## 2021-02-13 DIAGNOSIS — Z923 Personal history of irradiation: Secondary | ICD-10-CM | POA: Diagnosis not present

## 2021-02-13 DIAGNOSIS — Z9221 Personal history of antineoplastic chemotherapy: Secondary | ICD-10-CM | POA: Insufficient documentation

## 2021-02-13 LAB — CBC WITH DIFFERENTIAL (CANCER CENTER ONLY)
Abs Immature Granulocytes: 0.01 10*3/uL (ref 0.00–0.07)
Basophils Absolute: 0 10*3/uL (ref 0.0–0.1)
Basophils Relative: 0 %
Eosinophils Absolute: 0.1 10*3/uL (ref 0.0–0.5)
Eosinophils Relative: 2 %
HCT: 32.3 % — ABNORMAL LOW (ref 39.0–52.0)
Hemoglobin: 11.1 g/dL — ABNORMAL LOW (ref 13.0–17.0)
Immature Granulocytes: 0 %
Lymphocytes Relative: 23 %
Lymphs Abs: 1.4 10*3/uL (ref 0.7–4.0)
MCH: 34.6 pg — ABNORMAL HIGH (ref 26.0–34.0)
MCHC: 34.4 g/dL (ref 30.0–36.0)
MCV: 100.6 fL — ABNORMAL HIGH (ref 80.0–100.0)
Monocytes Absolute: 0.3 10*3/uL (ref 0.1–1.0)
Monocytes Relative: 5 %
Neutro Abs: 4.1 10*3/uL (ref 1.7–7.7)
Neutrophils Relative %: 70 %
Platelet Count: 143 10*3/uL — ABNORMAL LOW (ref 150–400)
RBC: 3.21 MIL/uL — ABNORMAL LOW (ref 4.22–5.81)
RDW: 13.6 % (ref 11.5–15.5)
WBC Count: 5.9 10*3/uL (ref 4.0–10.5)
nRBC: 0 % (ref 0.0–0.2)

## 2021-02-13 LAB — CMP (CANCER CENTER ONLY)
ALT: 19 U/L (ref 0–44)
AST: 24 U/L (ref 15–41)
Albumin: 4 g/dL (ref 3.5–5.0)
Alkaline Phosphatase: 79 U/L (ref 38–126)
Anion gap: 6 (ref 5–15)
BUN: 23 mg/dL (ref 8–23)
CO2: 26 mmol/L (ref 22–32)
Calcium: 8.8 mg/dL — ABNORMAL LOW (ref 8.9–10.3)
Chloride: 107 mmol/L (ref 98–111)
Creatinine: 0.81 mg/dL (ref 0.61–1.24)
GFR, Estimated: >60 mL/min (ref >60)
Glucose, Bld: 114 mg/dL — ABNORMAL HIGH (ref 70–99)
Potassium: 3.8 mmol/L (ref 3.5–5.1)
Sodium: 139 mmol/L (ref 135–145)
Total Bilirubin: 0.7 mg/dL (ref 0.3–1.2)
Total Protein: 6.4 g/dL — ABNORMAL LOW (ref 6.5–8.1)

## 2021-02-13 NOTE — Progress Notes (Signed)
[facility] [phone]   [phone]  OFFICE PROGRESS NOTE  [name], MD [place] [address]  DIAGNOSIS:  1) Stage IIB (T1 a, N1, M0) non-small cell lung cancer, adenocarcinoma diagnosed in June 2021. 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  Biomarker Findings Tumor Mutational Burden - 20 Muts/Mb Microsatellite status - MS-Stable Genomic Findings For a complete list of the genes assayed, please refer to the Appendix. KEAP1 I428f*17 [id number] RB1 splice site 16767+2C>[id number]* 8 Disease relevant genes with no reportable alterations: ALK, BRAF, EGFR, ERBB2, KRAS, MET, RET, ROS1  PDL1 Expression: 1 %  PRIOR THERAPY:  1) status post left upper lobectomy with lymph node dissection on December 16, 2019 under the care of Dr. LKipp Brood  The tumor size measured 0.9 cm but there was involvement of the level 10 L and 11 L.  2) Adjuvant systemic chemotherapy with cisplatin 75 mg/M2 and Alimta 500 mg/M2 every 3 weeks.  First dose February 04, 2020.  Status post 4 cycles.  CURRENT THERAPY: Neoadjuvant treatment with imatinib 400 mg p.o. daily for the GIST tumor of the pelvic area.  First dose started July 18, 2020.  Status post 7 months of treatment.  INTERVAL HISTORY: Jesse SKOCZYLAS70y.o. male returns to the clinic today for follow-up visit.  The patient is feeling fine today with no concerning complaints.  The patient is feeling fine today with no concerning complaints except for mild fatigue.  He denied having any current chest pain, shortness of breath, cough or hemoptysis.  He denied having any fever or chills.  He has no nausea, vomiting, diarrhea or constipation.  He has no headache or visual changes.  He continues to tolerate his treatment with Gleevec fairly well.  He is here today for evaluation and repeat blood work.  MEDICAL HISTORY: Past Medical History:  Diagnosis Date   Anemia    [place]  [place]    bladder cancer   COPD (chronic obstructive pulmonary disease) ([facility]    mild    DIVERTICULOSIS, COLON 01/14/2007   Qualifier: Diagnosis of  By: MPaulina FusiRN, JDaine Gravel   GASTRITIS, CHRONIC 06/01/2004   Qualifier: Diagnosis of  By: SNolon RodCMA (AAMA), Robin     GERD 01/09/2007   Qualifier: Diagnosis of  By: TSherren Mocha RN, Ellen     Heart murmur    as a child; no murmur heard 12/14/19   HYPERLIPIDEMIA 01/09/2007   Qualifier: Diagnosis of  By: TSherren MochaRN, EDorian Pod    Hypertension    Lung mass    left upper nodule   Macular degeneration    right eye   NEOPLASM, MALIGNANT, BLADDER, HX OF 2002   Qualifier: Diagnosis of  By: SNolon RodCMA (AAMA), Robin  / no chemo or radiation   OSTEOARTHRITIS 01/14/2007   Qualifier: Diagnosis of  By: MPaulina Fusi RN, JDaine Gravel- knees   Rectal mass    Reflux esophagitis 06/01/2004   Qualifier: Diagnosis of  By: SNolon RodCMA (AAMA), Robin     TOBACCO USER 02/16/2009   Qualifier: Diagnosis of  By: KBurnice Logan MD, PDoretha Sou   VITAMIN D DEFICIENCY 06/03/2007   Qualifier: Diagnosis of  By: KBurnice Logan MD, PDoretha Sou    ALLERGIES:  has No Known Allergies.  MEDICATIONS:  Current Outpatient Medications  Medication Sig Dispense Refill   amLODipine (NORVASC) 5 MG tablet Take 1 tablet (5 mg total) by mouth daily.  90 tablet 1   atorvastatin (LIPITOR) 40 MG tablet Take 1/2 tablet (20 mg total) by mouth daily. 90 tablet 1   diphenhydrAMINE (BENADRYL) 25 mg capsule Take 1-2 capsules (25-50 mg total) by mouth at bedtime as needed for sleep. 30 capsule 0   imatinib (GLEEVEC) 400 MG tablet Take 1 tablet (400 mg total) by mouth daily. Take with meals and large glass of water.Caution:Chemotherapy. 30 tablet 3   omeprazole (PRILOSEC) 40 MG capsule Take 1 capsule (40 mg total) by mouth daily. 90 capsule 3   Vitamin D, Ergocalciferol, (DRISDOL) 1.25 MG (50000 UNIT) CAPS capsule Take 1 capsule (50,000 Units total) by mouth every 7 (seven) days. 12 capsule 0   No current  facility-administered medications for this visit.    SURGICAL HISTORY:  Past Surgical History:  Procedure Laterality Date   BRONCHIAL BIOPSY  10/13/2019   Procedure: BRONCHIAL BIOPSIES;  Surgeon: Garner Nash, DO;  Location: [facility];  Service: Pulmonary;;   BRONCHIAL BRUSHINGS  10/13/2019   Procedure: BRONCHIAL BRUSHINGS;  Surgeon: Garner Nash, DO;  Location: [facility];  Service: Pulmonary;;   BRONCHIAL NEEDLE ASPIRATION BIOPSY  10/13/2019   Procedure: BRONCHIAL NEEDLE ASPIRATION BIOPSIES;  Surgeon: Garner Nash, DO;  Location: [facility];  Service: Pulmonary;;   BRONCHIAL WASHINGS  10/13/2019   Procedure: BRONCHIAL WASHINGS;  Surgeon: Garner Nash, DO;  Location: [facility];  Service: Pulmonary;;   COLONOSCOPY     multiple   INTERCOSTAL NERVE BLOCK Left 12/16/2019   Procedure: INTERCOSTAL NERVE BLOCK;  Surgeon: Lajuana Matte, MD;  Location: [facility];  Service: Thoracic;  Laterality: Left;   KNEE SURGERY  07/20/2008   bilat.   NASAL SEPTUM SURGERY  1995   NODE DISSECTION Left 12/16/2019   Procedure: NODE DISSECTION;  Surgeon: Lajuana Matte, MD;  Location: [facility];  Service: Thoracic;  Laterality: Left;   TONSILLECTOMY     TRANSURETHRAL RESECTION OF BLADDER TUMOR  2002   UPPER GASTROINTESTINAL ENDOSCOPY     VIDEO BRONCHOSCOPY WITH ENDOBRONCHIAL NAVIGATION N/A 10/13/2019   Procedure: VIDEO BRONCHOSCOPY WITH ENDOBRONCHIAL NAVIGATION;  Surgeon: Garner Nash, DO;  Location: [facility];  Service: Pulmonary;  Laterality: N/A;   WISDOM TOOTH EXTRACTION      REVIEW OF SYSTEMS:  A comprehensive review of systems was negative except for: Constitutional: positive for fatigue   PHYSICAL EXAMINATION: General appearance: alert, cooperative, fatigued, and no distress Head: Normocephalic, without obvious abnormality, atraumatic Neck: no adenopathy, no JVD, supple, symmetrical, trachea midline, and thyroid not enlarged, symmetric, no tenderness/mass/nodules Lymph  nodes: Cervical, supraclavicular, and axillary nodes normal. Resp: clear to auscultation bilaterally Back: symmetric, no curvature. ROM normal. No CVA tenderness. Cardio: regular rate and rhythm, S1, S2 normal, no murmur, click, rub or gallop GI: soft, non-tender; bowel sounds normal; no masses,  no organomegaly Extremities: extremities normal, atraumatic, no cyanosis or edema  ECOG PERFORMANCE STATUS: 1 - Symptomatic but completely ambulatory  Blood pressure 125/71, pulse 81, temperature (!) 97.1 F (36.2 C), temperature source Tympanic, resp. rate 20, height '5\' 9"'  (1.753 m), weight 174 lb 1.6 oz (79 kg), SpO2 100 %.  LABORATORY DATA: Lab Results  Component Value Date   WBC 5.9 02/13/2021   HGB 11.1 (L) 02/13/2021   HCT 32.3 (L) 02/13/2021   MCV 100.6 (H) 02/13/2021   PLT 143 (L) 02/13/2021      Chemistry      Component Value Date/Time   NA 143 12/30/2020 0914   K 3.9 12/30/2020 0914  CL 108 12/30/2020 0914   CO2 26 12/30/2020 0914   BUN 23 12/30/2020 0914   CREATININE 0.83 12/30/2020 0914      Component Value Date/Time   CALCIUM 9.2 12/30/2020 0914   ALKPHOS 107 12/30/2020 0914   AST 28 12/30/2020 0914   ALT 21 12/30/2020 0914   BILITOT 0.8 12/30/2020 0914       RADIOGRAPHIC STUDIES: No results found.  ASSESSMENT AND PLAN: This is a very pleasant 70 years old white male recently diagnosed with a stage IIb (T1 a, N1, M0) non-small cell lung cancer, adenocarcinoma in June 2021 status post left upper lobectomy with lymph node dissection on December 16, 2019 under the care of Dr. Kipp Brood.  The patient has no actionable mutations and PD-L1 expression was 1%. He was also diagnosed with pelvic GIST tumor. The patient completed a course of adjuvant systemic chemotherapy with cisplatin 75 mg/M2 and Alimta 500 mg/M2 every 3 weeks status post 4 cycles.  He tolerated his treatment well except for fatigue and occasional nausea. He is currently on observation for the lung  cancer. For the pelvic GIST tumor, the patient is currently on neoadjuvant treatment with imatinib 400 mg p.o. daily.  Status post 7 months.   The patient has been tolerating his treatment well except for mild fatigue. I recommended for him to continue his current treatment with Gleevec with the same dose for now. I will see him back for follow-up visit in 6 weeks for evaluation with repeat CT scan of the chest, abdomen and pelvis for restaging of his disease. For the anemia, he will continue his current treatment with oral iron tablets and multivitamins. He was advised to call immediately if he has any concerning symptoms in the interval. The patient voices understanding of current disease status and treatment options and is in agreement with the current care plan.  All questions were answered. The patient knows to call the clinic with any problems, questions or concerns. We can certainly see the patient much sooner if necessary.   Disclaimer: This note was dictated with voice recognition software. Similar sounding words can inadvertently be transcribed and may not be corrected upon review.

## 2021-02-15 ENCOUNTER — Telehealth: Payer: Self-pay | Admitting: Internal Medicine

## 2021-02-15 NOTE — Telephone Encounter (Signed)
Scheduled follow-up appointments per 10/31 los. Patient is aware.

## 2021-02-27 ENCOUNTER — Telehealth: Payer: Self-pay | Admitting: Pharmacist

## 2021-02-27 NOTE — Chronic Care Management (AMB) (Signed)
Chronic Care Management Pharmacy Assistant   Name: Jesse [name]  MRN: [id number] DOB: 05-09-1950  Jesse [name] is an 70 y.o. year old male who presents for his initial CCM visit with the clinical pharmacist.  Reason for Encounter: Chart prep for initial visit with Jeni Salles the clinical pharmacist on 03/06/2021   Conditions to be addressed/monitored: HTN, GERD, and Osteoarthritis, Dyslipidemia, Diverticulosis , Lung cancer and GIST  Recent office visits:  10/24/2020 Grier Mitts MD (PCP) - Patient was seen for a well adult exam and additional issues. No medication changes. Follow up in 3 months.  07/12/2020 Charlott Rakes LPN - Medicare annual wellness exam  Recent consult visits:  02/13/2021 Curt Bears MD (oncology) - Patient was seen for Malignant neoplasm of unspecified part of unspecified bronchus or lung and additional issues. Discontinued Ergocalciferol. Follow up in 6 weeks.  01/02/2021 Curt Bears MD (oncology) - Patient was seen for Adenocarcinoma of left lung. No medication changes. Follow up in 6 weeks.  11/22/2020 Curt Bears MD (oncology) - Patient was seen for Malignant gastrointestinal stomal tumor of rectum and an additional issue. No medication changes. Follow up in 6 weeks.  10/12/2020 Curt Bears MD (oncology) - Patient was seen for Adenocarcinoma of left lung. No medication changes. Follow up in 6 weeks.  Hospital visits:  None  Medications: Outpatient Encounter Medications as of 02/27/2021  Medication Sig   amLODipine (NORVASC) 5 MG tablet Take 1 tablet (5 mg total) by mouth daily.   atorvastatin (LIPITOR) 40 MG tablet Take 1/2 tablet (20 mg total) by mouth daily.   Cholecalciferol (VITAMIN D3) 10 MCG (400 UNIT) CAPS Take 1 tablet by mouth daily.   diphenhydrAMINE (BENADRYL) 25 mg capsule Take 1-2 capsules (25-50 mg total) by mouth at bedtime as needed for sleep.   imatinib (GLEEVEC) 400 MG tablet Take 1 tablet (400 mg total)  by mouth daily. Take with meals and large glass of water.Caution:Chemotherapy.   omeprazole (PRILOSEC) 40 MG capsule Take 1 capsule (40 mg total) by mouth daily.   No facility-administered encounter medications on file as of 02/27/2021.   Fill History: ATORVASTATIN CALCIUM  40 MG TABS 02/13/2021 90   AMLODIPINE BESYLATE 5 MG TAB 12/20/2020 90   OMEPRAZOLE DR 40 MG CAPSULE 12/01/2020 90   imatinib (GLEEVEC) 400 MG tablet 02/09/2021 30   Have you seen any other providers since your last visit? **no  Any changes in your medications or health? no  Any side effects from any medications? no  Do you have an symptoms or problems not managed by your medications? no  Any concerns about your health right now? no  Has your provider asked that you check blood pressure, blood sugar, or follow special diet at home? No, patient does eat a clean organic diet, they grow their own vegetables and eat a lot of fish.   Do you get any type of exercise on a regular basis? Yes, he gets in 10,000 steps daily  Can you think of a goal you would like to reach for your health? He would like to maintain current weight.  Do you have any problems getting your medications? no  Is there anything that you would like to discuss during the appointment? No  Please bring medications and supplements to appointment  Care Gaps: AWV - completed 07/12/2020 Last BP - 125/71 on 02/13/2021 Covid vaccine - overdue  Star Rating Drugs: Atorvastatin 40mg  - last filled 02/13/2021 at [place] [facility]  Assistant [phone]

## 2021-03-02 ENCOUNTER — Other Ambulatory Visit (HOSPITAL_COMMUNITY): Payer: Self-pay

## 2021-03-03 ENCOUNTER — Telehealth: Payer: Self-pay | Admitting: Pharmacist

## 2021-03-03 NOTE — Chronic Care Management (AMB) (Signed)
    Chronic Care Management Pharmacy Assistant   Name: Jesse [name]  MRN: [id number] DOB: October 08, 1950  03/06/2021 APPOINTMENT REMINDER   Jesse [name] was reminded to have all medications, supplements and any blood glucose and blood pressure readings available for review with Jeni Salles, Pharm. D, at his office visit on 03/06/2021 at 3:00.   Questions: Have you had any recent office visit or specialist visit outside of [facility]? No  Are there any concerns you would like to discuss during your office visit? No  Are you having any problems obtaining your medications? (Whether it pharmacy issues or cost) No  If patient has any PAP medications ask if they are having any problems getting their PAP medication or refill? No  Care Gaps: AWV - completed 07/12/2020 Last BP - 125/71 on 02/13/2021 Covid vaccine - overdue  Star Rating Drug: Atorvastatin 40mg  - last filled 02/13/2021 at [facility]   Any gaps in medications fill history? No  [place]  Catering manager [phone]

## 2021-03-06 ENCOUNTER — Ambulatory Visit (INDEPENDENT_AMBULATORY_CARE_PROVIDER_SITE_OTHER): Payer: Medicare Other | Admitting: Pharmacist

## 2021-03-06 VITALS — BP 135/80

## 2021-03-06 DIAGNOSIS — I10 Essential (primary) hypertension: Secondary | ICD-10-CM

## 2021-03-06 DIAGNOSIS — I700 Atherosclerosis of aorta: Secondary | ICD-10-CM

## 2021-03-06 NOTE — Progress Notes (Signed)
Chronic Care Management Pharmacy Note  03/15/2021 Name:  SHAMAN MUSCARELLA MRN:  [id number] DOB:  25-Apr-1950  Summary: BP is at goal < 140/90 LDL is not at goal < 70  Recommendations/Changes made from today's visit: -Recommended bringing BP cuff to next office visit to ensure accuracy -Recommend repeat lipid panel and vitamin D level -Recommended timed release melatonin 2 or 3 mg to replace Benadryl  Plan: Follow up with results of lab work   Subjective: ELMAN DETTMAN is an 70 y.o. year old male who is a primary patient of [name], MD.  The CCM team was consulted for assistance with disease management and care coordination needs.    Engaged with patient face to face for initial visit in response to provider referral for pharmacy case management and/or care coordination services.   Consent to Services:  The patient was given the following information about Chronic Care Management services today, agreed to services, and gave verbal consent: 1. CCM service includes personalized support from designated clinical staff supervised by the primary care provider, including individualized plan of care and coordination with other care providers 2. 24/7 contact phone numbers for assistance for urgent and routine care needs. 3. Service will only be billed when office clinical staff spend 20 minutes or more in a month to coordinate care. 4. Only one practitioner may furnish and bill the service in a calendar month. 5.The patient may stop CCM services at any time (effective at the end of the month) by phone call to the office staff. 6. The patient will be responsible for cost sharing (co-pay) of up to 20% of the service fee (after annual deductible is met). Patient agreed to services and consent obtained.  Patient Care Team: [name], MD as PCP - General (Family Medicine) Curt Bears, MD as Consulting Physician (Oncology) Viona Gilmore, [facility] as Pharmacist  (Pharmacist)  Recent office visits: 10/24/2020 Grier Mitts MD (PCP) - Patient was seen for a well adult exam and additional issues. No medication changes. Follow up in 3 months.   07/12/2020 Charlott Rakes LPN - Medicare annual wellness exam  Recent consult visits: 02/13/2021 Curt Bears MD (oncology) - Patient was seen for Malignant neoplasm of unspecified part of unspecified bronchus or lung and additional issues. Discontinued Ergocalciferol. Follow up in 6 weeks.   01/02/2021 Curt Bears MD (oncology) - Patient was seen for Adenocarcinoma of left lung. No medication changes. Follow up in 6 weeks.   11/22/2020 Curt Bears MD (oncology) - Patient was seen for Malignant gastrointestinal stomal tumor of rectum and an additional issue. No medication changes. Follow up in 6 weeks.   10/12/2020 Curt Bears MD (oncology) - Patient was seen for Adenocarcinoma of left lung. No medication changes. Follow up in 6 weeks.  Hospital visits: None in previous 6 months   Objective:  Lab Results  Component Value Date   CREATININE 0.81 02/13/2021   BUN 23 02/13/2021   GFR 96.91 10/08/2019   GFRNONAA >60 02/13/2021   GFRAA >60 12/31/2019   NA 139 02/13/2021   K 3.8 02/13/2021   CALCIUM 8.8 (L) 02/13/2021   CO2 26 02/13/2021   GLUCOSE 114 (H) 02/13/2021    Lab Results  Component Value Date/Time   HGBA1C 5.8 10/22/2018 08:38 AM   GFR 96.91 10/08/2019 10:07 AM   GFR 108.16 10/22/2018 08:38 AM    Last diabetic Eye exam: No results found for: HMDIABEYEEXA  Last diabetic Foot exam: No results found for: HMDIABFOOTEX  Lab Results  Component Value Date   CHOL 191 10/22/2018   HDL 42.50 10/22/2018   LDLDIRECT 102.0 10/22/2018   TRIG 211.0 (H) 10/22/2018   CHOLHDL 4 10/22/2018    Hepatic Function Latest Ref Rng & Units 02/13/2021 12/30/2020 11/22/2020  Total Protein 6.5 - 8.1 g/dL 6.4(L) 6.6 6.4(L)  Albumin 3.5 - 5.0 g/dL 4.0 4.1 3.8  AST 15 - 41 U/L '24 28 26  ' ALT 0 - 44  U/L '19 21 20  ' Alk Phosphatase 38 - 126 U/L 79 107 115  Total Bilirubin 0.3 - 1.2 mg/dL 0.7 0.8 0.4  Bilirubin, Direct 0.0 - 0.3 mg/dL - - -    Lab Results  Component Value Date/Time   TSH 1.10 10/24/2020 08:52 AM   TSH 1.23 10/08/2017 10:16 AM   FREET4 0.82 10/24/2020 08:52 AM    CBC Latest Ref Rng & Units 02/13/2021 12/30/2020 11/22/2020  WBC 4.0 - 10.5 K/uL 5.9 4.4 4.7  Hemoglobin 13.0 - 17.0 g/dL 11.1(L) 12.0(L) 11.8(L)  Hematocrit 39.0 - 52.0 % 32.3(L) 34.1(L) 33.9(L)  Platelets 150 - 400 K/uL 143(L) 132(L) 125(L)    Lab Results  Component Value Date/Time   VD25OH 20.27 (L) 10/24/2020 08:52 AM    Clinical ASCVD: Yes  The 10-year ASCVD risk score (Arnett DK, et al., 2019) is: 29.6%   Values used to calculate the score:     Age: 70 years     Sex: Male     Is Non-Hispanic African American: No     Diabetic: No     Tobacco smoker: Yes     Systolic Blood Pressure: 096 mmHg     Is BP treated: Yes     HDL Cholesterol: 42.5 mg/dL     Total Cholesterol: 191 mg/dL    Depression screen [facility] 2/9 07/12/2020 06/03/2019 06/03/2019  Decreased Interest 0 0 0  Down, Depressed, Hopeless 0 0 0  PHQ - 2 Score 0 0 0      Social History   Tobacco Use  Smoking Status Former   Packs/day: 0.50   Years: 40.00   Pack years: 20.00   Types: Cigarettes   Quit date: 09/21/2019   Years since quitting: 1.4  Smokeless Tobacco Never   BP Readings from Last 3 Encounters:  03/06/21 135/80  02/13/21 125/71  01/02/21 131/64   Pulse Readings from Last 3 Encounters:  02/13/21 81  01/02/21 67  11/22/20 81   Wt Readings from Last 3 Encounters:  02/13/21 174 lb 1.6 oz (79 kg)  01/02/21 174 lb 3.2 oz (79 kg)  11/22/20 174 lb 8 oz (79.2 kg)   BMI Readings from Last 3 Encounters:  02/13/21 25.71 kg/m  01/02/21 25.72 kg/m  11/22/20 25.77 kg/m    Assessment/Interventions: Review of patient past medical history, allergies, medications, health status, including review of consultants reports,  laboratory and other test data, was performed as part of comprehensive evaluation and provision of chronic care management services.   SDOH:  (Social Determinants of Health) assessments and interventions performed: Yes SDOH Interventions    Flowsheet Row Most Recent Value  SDOH Interventions   Financial Strain Interventions Intervention Not Indicated  Transportation Interventions Intervention Not Indicated      SDOH Screenings   Alcohol Screen: Not on file  Depression (PHQ2-9): Low Risk    PHQ-2 Score: 0  Financial Resource Strain: Low Risk    Difficulty of Paying Living Expenses: Not hard at all  Food Insecurity: No Food Insecurity   Worried About Running  Out of Food in the Last Year: Never true   Ran Out of Food in the Last Year: Never true  Housing: Medium Risk   Last Housing Risk Score: 1  Physical Activity: Sufficiently Active   Days of Exercise per Week: 7 days   Minutes of Exercise per Session: 60 min  Social Connections: Moderately Isolated   Frequency of Communication with Friends and Family: More than three times a week   Frequency of Social Gatherings with Friends and Family: More than three times a week   Attends Religious Services: Never   Marine scientist or Organizations: No   Attends Music therapist: Never   Marital Status: Married  Stress: No Stress Concern Present   Feeling of Stress : Not at all  Tobacco Use: Medium Risk   Smoking Tobacco Use: Former   Smokeless Tobacco Use: Never   Passive Exposure: Not on Pensions consultant Needs: No Transportation Needs   Lack of Transportation (Medical): No   Lack of Transportation (Non-Medical): No   Patient usually gets up around 6am and eats breakfast at 8:30. He then watches TV and takes care of his dogs.  Patient usually takes [place] shortly after and that gives him a hangover feeling for a few hours. He then spends the rest of the day gardening and playing with his dogs. He owns 35 acres  of land which keeps him busy and he walks about 12,000 steps per day. He also plays golf twice a week.  Patient quit smoking in June of last year when he found out about his lung cancer. He does feel short of breath when walking uphill but that is about it. He also recently tried to put on some weight and gained 10 lbs. Patient hasn't had any coughing recently and has had spirometry testing done in the past.  Patient reports he feels rested during the day and isn't having any issues falling asleep or staying asleep. He sometimes takes a 5 hour energy only when doing strenuous work. He doesn't nap often but sometimes needs an hour or so after taking Gleevec to rest.  Patient denies any current issues with his medicines and reports he has been taking them for quite a while.  CCM Care Plan  No Known Allergies  Medications Reviewed Today     Reviewed by Viona Gilmore, [facility] (Pharmacist) on 03/06/21 at [place] List Status: <None>   Medication Order Taking? Sig Documenting Provider Last Dose Status Informant  amLODipine (NORVASC) 5 MG tablet [id number] Yes Take 1 tablet (5 mg total) by mouth daily. [name], MD Taking Active   atorvastatin (LIPITOR) 40 MG tablet [id number] Yes Take 1/2 tablet (20 mg total) by mouth daily. [name], MD Taking Active   diphenhydrAMINE (BENADRYL) 25 mg capsule [id number] Yes Take 1-2 capsules (25-50 mg total) by mouth at bedtime as needed for sleep. Elgie Collard, PA-C Taking Active   ibuprofen (ADVIL) 200 MG tablet [id number] Yes Take 200 mg by mouth every 6 (six) hours as needed. [provider] Taking Active   imatinib (GLEEVEC) 400 MG tablet [id number] Yes Take 1 tablet (400 mg total) by mouth daily. Take with meals and large glass of water.Caution:Chemotherapy. Curt Bears, MD Taking Active   Multiple Vitamins-Minerals (ICAPS AREDS 2 PO) [id number] Yes Take 1 capsule by mouth 2 (two) times daily. [provider] Taking Active    omeprazole (PRILOSEC) 40 MG capsule [id number] Yes Take 1 capsule (40 mg  total) by mouth daily. [name], MD Taking Active   Vitamin D3 (VITAMIN D) 25 MCG tablet [id number] Yes Take 1 tablet by mouth daily. 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   GIST, malignant ([place]) 02/25/2020   Encounter for antineoplastic chemotherapy 01/28/2020   Adenocarcinoma of left lung, stage 2 ([place]) 12/31/2019   Goals of care, counseling/discussion 12/31/2019   S/P lobectomy of lung 12/16/2019   Lung nodule 10/13/2019   Essential hypertension 09/28/2016   Coronary artery calcification 09/28/2016   Impaired glucose tolerance 09/28/2016   TOBACCO USER 02/16/2009   NEOPLASM, MALIGNANT, BLADDER, HX OF 07/18/2007   Vitamin D deficiency 06/03/2007   DIVERTICULOSIS, COLON 01/14/2007   Osteoarthritis 01/14/2007   Dyslipidemia 01/09/2007   GERD 01/09/2007   REFLUX ESOPHAGITIS 06/01/2004   GASTRITIS, CHRONIC 06/01/2004    Immunization History  Administered Date(s) Administered   Fluad Quad(high Dose 65+) 01/16/2019, 02/25/2020   PFIZER(Purple Top)SARS-COV-2 Vaccination 06/07/2019, 06/30/2019, 03/17/2020   Pfizer Covid-19 Vaccine Bivalent Booster 87yr & up 02/01/2021   Pneumococcal Conjugate-13 06/08/2015   Pneumococcal Polysaccharide-23 09/28/2016   Tdap 10/07/2012   Zoster Recombinat (Shingrix) 11/09/2020, 03/02/2021    Conditions to be addressed/monitored:  Hypertension, Hyperlipidemia, Coronary Artery Disease, GERD, and Osteoarthritis  Care Plan : CCM Pharmacy Care Plan  Updates made by PViona Gilmore [place] 03/15/2021 12:00 AM     Problem: Problem: Hypertension, Hyperlipidemia, Coronary Artery Disease, GERD, and Osteoarthritis      Long-Range Goal: Patient-Specific  Goal   Start Date: 03/06/2021  Expected End Date: 03/06/2022  This Visit's Progress: On track  Priority: High  Note:   Current Barriers:  Unable to independently monitor therapeutic efficacy Unable to achieve control of cholesterol   Pharmacist Clinical Goal(s):  Patient will achieve adherence to monitoring guidelines and medication adherence to achieve therapeutic efficacy achieve control of cholesterol as evidenced by next lipid panel  through collaboration with PharmD and provider.   Interventions: 1:1 collaboration with BBillie Ruddy MD regarding development and update of comprehensive plan of care as evidenced by provider attestation and co-signature Inter-disciplinary care team collaboration (see longitudinal plan of care) Comprehensive medication review performed; medication list updated in electronic medical record  Hypertension (BP goal <140/90) -Controlled -Current treatment: Amlodipine 5 mg 1 tablet daily -Medications previously tried: n/a  -Current home readings: 125-135/75-80 (checking every day with wrist cuff) -Current dietary habits: doesn't eat whole lot of salt; does look at package labels - eating a lot of fresh foods; eating out very little (twice a month later) -Current exercise habits: walki -Denies hypotensive/hypertensive symptoms -Educated on BP goals and benefits of medications for prevention of heart attack, stroke and kidney damage; Importance of home blood pressure monitoring; Proper BP monitoring technique; -Counseled to monitor BP at home at least weekly, document, and provide log at future appointments -Counseled on diet and exercise extensively Recommended to continue current medication Cautioned against consistent use of Advil as this can raise his blood pressure.  Hyperlipidemia: (LDL goal < 70) -Uncontrolled -Current treatment: Atorvastatin 40 mg 1/2 tablet daily -Medications previously tried: none  -Current dietary patterns: tries to  eat healthy -Current exercise habits: walking daily -Educated on Cholesterol goals;  Benefits of statin for ASCVD risk reduction; -Counseled on diet and  exercise extensively Recommended to continue current medication Recommended repeat lipid panel and consider increasing to 40 mg daily if LDL is above goal < 70.  GERD (Goal: minimize symptoms) -Controlled -Current treatment  Omeprazole 40 mg 1 capsule daily -Medications previously tried: none  -Recommended to continue current medication Counseled on foods to limit to avoid symptoms of heartburn or acid reflux.  Rectal cancer (Goal: prevent progression of cancer) -Controlled -Current treatment  Imatinib 400 mg 1 tablet daily -Medications previously tried: n/a  -Recommended to continue current medication  Vitamin D deficiency (Goal: vitamin D 30-100) -Controlled -Current treatment  Vitamin D 1000 units daily  -Medications previously tried: none  -Recommended repeat level.  Health Maintenance -Vaccine gaps: none -Current therapy:  Diphenhydramine 25 mg - taking one a day at night - taking for about a year  CBD gummies - not often -Educated on Cost vs benefit of each product must be carefully weighed by individual consumer -Patient is satisfied with current therapy and denies issues -Recommended limiting use of Benadryl due to risk of falls. Recommended timed release melatonin.  Patient Goals/Self-Care Activities Patient will:  - take medications as prescribed as evidenced by patient report and record review check blood pressure at least weekly, document, and provide at future appointments target a minimum of 150 minutes of moderate intensity exercise weekly  Follow Up Plan: The care management team will reach out to the patient again over the next 30 days.         Medication Assistance: None required.  Patient affirms current coverage meets needs.  Compliance/Adherence/Medication fill history: Care Gaps: Last BP -  125/71 on 02/13/2021  Star-Rating Drugs: Atorvastatin 19m - last [facility]  [place] [facility] [id number]: [phone]: [phone] [id number] [place] [facility] [address] [place] [id number]: [phone]: [phone] pill box? Yes - weekly Pt endorses 99% compliance - hardly ever  We discussed: Current pharmacy is preferred with insurance plan and patient is satisfied with pharmacy services Patient decided to: Continue current medication management strategy  Care Plan and Follow Up Patient Decision:  Patient agrees to Care Plan and Follow-up.  Plan: The care management team will reach out to the patient again over the next 30 days.  MaJeni SallesPharmD, BCCla[facility] Centerharmacist [place]

## 2021-03-06 NOTE — Patient Instructions (Addendum)
Hi Jesse [name],  It was great to get to meet you in person! Below is a summary of some of the topics we discussed.   Don't forget to try the timed release melatonin to see if that helps with sleep so we can limit your use of the Benadryl, which isn't really recommended to use long term. I would look for 2 or 3 mg of melatonin to try. Also, try to limit your TV before bed as this may make a huge difference with your quality of sleep.  Please reach out to me if you have any questions or need anything before our follow up!  Best, Maddie  Jeni Salles, PharmD, [facility] [place]   Visit Information   Goals Addressed             This Visit's Progress    Track and Manage My Blood Pressure-Hypertension       Timeframe:  Long-Range Goal Priority:  Medium Start Date:                             Expected End Date:                       Follow Up Date 05/15/21    - check blood pressure weekly - choose a place to take my blood pressure (home, clinic or office, retail store) - write blood pressure results in a log or diary    Why is this important?   You won't feel high blood pressure, but it can still hurt your blood vessels.  High blood pressure can cause heart or kidney problems. It can also cause a stroke.  Making lifestyle changes like losing a little weight or eating less salt will help.  Checking your blood pressure at home and at different times of the day can help to control blood pressure.  If the doctor prescribes medicine remember to take it the way the doctor ordered.  Call the office if you cannot afford the medicine or if there are questions about it.     Notes:        Patient Care Plan: CCM Pharmacy Care Plan     Problem Identified: Problem: Hypertension, Hyperlipidemia, Coronary Artery Disease, GERD, and Osteoarthritis      Long-Range Goal: Patient-Specific Goal   Start Date: 03/06/2021  Expected End Date:  03/06/2022  This Visit's Progress: On track  Priority: High  Note:   Current Barriers:  Unable to independently monitor therapeutic efficacy Unable to achieve control of cholesterol   Pharmacist Clinical Goal(s):  Patient will achieve adherence to monitoring guidelines and medication adherence to achieve therapeutic efficacy achieve control of cholesterol as evidenced by next lipid panel  through collaboration with PharmD and provider.   Interventions: 1:1 collaboration with [name], MD regarding development and update of comprehensive plan of care as evidenced by provider attestation and co-signature Inter-disciplinary care team collaboration (see longitudinal plan of care) Comprehensive medication review performed; medication list updated in electronic medical record  Hypertension (BP goal <140/90) -Controlled -Current treatment: Amlodipine 5 mg 1 tablet daily -Medications previously tried: n/a  -Current home readings: 125-135/75-80 (checking every day with wrist cuff) -Current dietary habits: doesn't eat whole lot of salt; does look at package labels - eating a lot of fresh foods; eating out very little (twice a month later) -Current exercise habits: walki -Denies hypotensive/hypertensive symptoms -Educated on BP  goals and benefits of medications for prevention of heart attack, stroke and kidney damage; Importance of home blood pressure monitoring; Proper BP monitoring technique; -Counseled to monitor BP at home at least weekly, document, and provide log at future appointments -Counseled on diet and exercise extensively Recommended to continue current medication Cautioned against consistent use of Advil as this can raise his blood pressure.  Hyperlipidemia: (LDL goal < 70) -Uncontrolled -Current treatment: Atorvastatin 40 mg 1/2 tablet daily -Medications previously tried: none  -Current dietary patterns: tries to eat healthy -Current exercise habits: walking  daily -Educated on Cholesterol goals;  Benefits of statin for ASCVD risk reduction; -Counseled on diet and exercise extensively Recommended to continue current medication Recommended repeat lipid panel and consider increasing to 40 mg daily if LDL is above goal < 70.  GERD (Goal: minimize symptoms) -Controlled -Current treatment  Omeprazole 40 mg 1 capsule daily -Medications previously tried: none  -Recommended to continue current medication Counseled on foods to limit to avoid symptoms of heartburn or acid reflux.  Rectal cancer (Goal: prevent progression of cancer) -Controlled -Current treatment  Imatinib 400 mg 1 tablet daily -Medications previously tried: n/a  -Recommended to continue current medication  Vitamin D deficiency (Goal: vitamin D 30-100) -Controlled -Current treatment  Vitamin D 1000 units daily  -Medications previously tried: none  -Recommended repeat level.  Health Maintenance -Vaccine gaps: none -Current therapy:  Diphenhydramine 25 mg - taking one a day at night - taking for about a year  CBD gummies - not often -Educated on Cost vs benefit of each product must be carefully weighed by individual consumer -Patient is satisfied with current therapy and denies issues -Recommended limiting use of Benadryl due to risk of falls. Recommended timed release melatonin.  Patient Goals/Self-Care Activities Patient will:  - take medications as prescribed as evidenced by patient report and record review check blood pressure at least weekly, document, and provide at future appointments target a minimum of 150 minutes of moderate intensity exercise weekly  Follow Up Plan: The care management team will reach out to the patient again over the next 30 days.       Jesse [name] was given information about Chronic Care Management services today including:  CCM service includes personalized support from designated clinical staff supervised by his physician, including  individualized plan of care and coordination with other care providers 24/7 contact phone numbers for assistance for urgent and routine care needs. Standard insurance, coinsurance, copays and deductibles apply for chronic care management only during months in which we provide at least 20 minutes of these services. Most insurances cover these services at 100%, however patients may be responsible for any copay, coinsurance and/or deductible if applicable. This service may help you avoid the need for more expensive face-to-face services. Only one practitioner may furnish and bill the service in a calendar month. The patient may stop CCM services at any time (effective at the end of the month) by phone call to the office staff.  Patient agreed to services and verbal consent obtained.   Patient verbalizes understanding of instructions provided today and agrees to view in [place].  The pharmacy team will reach out to the patient again over the next 30 days.   Viona Gilmore, [facility]

## 2021-03-07 ENCOUNTER — Other Ambulatory Visit (HOSPITAL_COMMUNITY): Payer: Self-pay

## 2021-03-15 DIAGNOSIS — I10 Essential (primary) hypertension: Secondary | ICD-10-CM

## 2021-03-22 ENCOUNTER — Other Ambulatory Visit: Payer: Self-pay | Admitting: Family Medicine

## 2021-03-22 DIAGNOSIS — E559 Vitamin D deficiency, unspecified: Secondary | ICD-10-CM

## 2021-03-22 DIAGNOSIS — E782 Mixed hyperlipidemia: Secondary | ICD-10-CM

## 2021-03-24 ENCOUNTER — Ambulatory Visit (HOSPITAL_COMMUNITY)
Admission: RE | Admit: 2021-03-24 | Discharge: 2021-03-24 | Disposition: A | Discharge status: Home / Self Care | Payer: Medicare Other | Source: Ambulatory Visit | Attending: Internal Medicine | Admitting: Internal Medicine

## 2021-03-24 ENCOUNTER — Inpatient Hospital Stay: Payer: Medicare Other | Attending: Internal Medicine

## 2021-03-24 ENCOUNTER — Other Ambulatory Visit: Payer: Self-pay

## 2021-03-24 DIAGNOSIS — C3490 Malignant neoplasm of unspecified part of unspecified bronchus or lung: Secondary | ICD-10-CM | POA: Insufficient documentation

## 2021-03-24 DIAGNOSIS — C49A9 Gastrointestinal stromal tumor of other sites: Secondary | ICD-10-CM | POA: Insufficient documentation

## 2021-03-24 DIAGNOSIS — Z8551 Personal history of malignant neoplasm of bladder: Secondary | ICD-10-CM | POA: Insufficient documentation

## 2021-03-24 DIAGNOSIS — Z79899 Other long term (current) drug therapy: Secondary | ICD-10-CM | POA: Insufficient documentation

## 2021-03-24 DIAGNOSIS — Z902 Acquired absence of lung [part of]: Secondary | ICD-10-CM | POA: Insufficient documentation

## 2021-03-24 DIAGNOSIS — Z9221 Personal history of antineoplastic chemotherapy: Secondary | ICD-10-CM | POA: Insufficient documentation

## 2021-03-24 DIAGNOSIS — C3412 Malignant neoplasm of upper lobe, left bronchus or lung: Secondary | ICD-10-CM | POA: Insufficient documentation

## 2021-03-24 LAB — CMP (CANCER CENTER ONLY)
ALT: 17 U/L (ref 0–44)
AST: 22 U/L (ref 15–41)
Albumin: 4.1 g/dL (ref 3.5–5.0)
Alkaline Phosphatase: 78 U/L (ref 38–126)
Anion gap: 9 (ref 5–15)
BUN: 21 mg/dL (ref 8–23)
CO2: 26 mmol/L (ref 22–32)
Calcium: 8.9 mg/dL (ref 8.9–10.3)
Chloride: 106 mmol/L (ref 98–111)
Creatinine: 0.83 mg/dL (ref 0.61–1.24)
GFR, Estimated: >60 mL/min (ref >60)
Glucose, Bld: 107 mg/dL — ABNORMAL HIGH (ref 70–99)
Potassium: 3.8 mmol/L (ref 3.5–5.1)
Sodium: 141 mmol/L (ref 135–145)
Total Bilirubin: 0.7 mg/dL (ref 0.3–1.2)
Total Protein: 6.6 g/dL (ref 6.5–8.1)

## 2021-03-24 LAB — CBC WITH DIFFERENTIAL (CANCER CENTER ONLY)
Abs Immature Granulocytes: 0.01 10*3/uL (ref 0.00–0.07)
Basophils Absolute: 0 10*3/uL (ref 0.0–0.1)
Basophils Relative: 0 %
Eosinophils Absolute: 0.1 10*3/uL (ref 0.0–0.5)
Eosinophils Relative: 2 %
HCT: 34.8 % — ABNORMAL LOW (ref 39.0–52.0)
Hemoglobin: 11.8 g/dL — ABNORMAL LOW (ref 13.0–17.0)
Immature Granulocytes: 0 %
Lymphocytes Relative: 21 %
Lymphs Abs: 0.9 10*3/uL (ref 0.7–4.0)
MCH: 33.9 pg (ref 26.0–34.0)
MCHC: 33.9 g/dL (ref 30.0–36.0)
MCV: 100 fL (ref 80.0–100.0)
Monocytes Absolute: 0.2 10*3/uL (ref 0.1–1.0)
Monocytes Relative: 6 %
Neutro Abs: 2.9 10*3/uL (ref 1.7–7.7)
Neutrophils Relative %: 71 %
Platelet Count: 126 10*3/uL — ABNORMAL LOW (ref 150–400)
RBC: 3.48 MIL/uL — ABNORMAL LOW (ref 4.22–5.81)
RDW: 13.2 % (ref 11.5–15.5)
WBC Count: 4.2 10*3/uL (ref 4.0–10.5)
nRBC: 0 % (ref 0.0–0.2)

## 2021-03-24 MED ORDER — SODIUM CHLORIDE (PF) 0.9 % IJ SOLN
INTRAMUSCULAR | Status: AC
  Filled 2021-03-24: qty 50

## 2021-03-24 MED ORDER — IOHEXOL 350 MG/ML SOLN
80.0000 mL | Freq: Once | INTRAVENOUS | Status: AC | PRN
  Administered 2021-03-24: 80 mL via INTRAVENOUS

## 2021-03-27 ENCOUNTER — Inpatient Hospital Stay: Payer: Medicare Other | Admitting: Internal Medicine

## 2021-03-27 ENCOUNTER — Other Ambulatory Visit: Payer: Self-pay

## 2021-03-27 VITALS — BP 139/69 | HR 92 | Temp 97.9°F | Resp 19 | Ht 69.0 in | Wt 174.7 lb

## 2021-03-27 DIAGNOSIS — Z8551 Personal history of malignant neoplasm of bladder: Secondary | ICD-10-CM | POA: Diagnosis not present

## 2021-03-27 DIAGNOSIS — Z79899 Other long term (current) drug therapy: Secondary | ICD-10-CM | POA: Diagnosis not present

## 2021-03-27 DIAGNOSIS — C49A5 Gastrointestinal stromal tumor of rectum: Secondary | ICD-10-CM | POA: Diagnosis not present

## 2021-03-27 DIAGNOSIS — C3412 Malignant neoplasm of upper lobe, left bronchus or lung: Secondary | ICD-10-CM | POA: Diagnosis present

## 2021-03-27 DIAGNOSIS — C49A9 Gastrointestinal stromal tumor of other sites: Secondary | ICD-10-CM | POA: Diagnosis not present

## 2021-03-27 DIAGNOSIS — C3492 Malignant neoplasm of unspecified part of left bronchus or lung: Secondary | ICD-10-CM

## 2021-03-27 DIAGNOSIS — Z9221 Personal history of antineoplastic chemotherapy: Secondary | ICD-10-CM | POA: Diagnosis not present

## 2021-03-27 DIAGNOSIS — Z5111 Encounter for antineoplastic chemotherapy: Secondary | ICD-10-CM

## 2021-03-27 DIAGNOSIS — Z902 Acquired absence of lung [part of]: Secondary | ICD-10-CM | POA: Diagnosis not present

## 2021-03-27 NOTE — Progress Notes (Signed)
[place] [phone]   [phone]  OFFICE PROGRESS NOTE  [name], MD [place] [address]  DIAGNOSIS:  1) Stage IIB (T1 a, N1, M0) non-small cell lung cancer, adenocarcinoma diagnosed in June 2021. 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  Biomarker Findings Tumor Mutational Burden - 20 Muts/Mb Microsatellite status - MS-Stable Genomic Findings For a complete list of the genes assayed, please refer to the Appendix. KEAP1 I453f*17 [id number] RB1 splice site [id number]* 8 Disease relevant genes with no reportable alterations: ALK, BRAF, EGFR, ERBB2, KRAS, MET, RET, ROS1  PDL1 Expression: 1 %  PRIOR THERAPY:  1) status post left upper lobectomy with lymph node dissection on December 16, 2019 under the care of Dr. LKipp Brood  The tumor size measured 0.9 cm but there was involvement of the level 10 L and 11 L.  2) Adjuvant systemic chemotherapy with cisplatin 75 mg/M2 and Alimta 500 mg/M2 every 3 weeks.  First dose February 04, 2020.  Status post 4 cycles.  CURRENT THERAPY: Neoadjuvant treatment with imatinib 400 mg p.o. daily for the GIST tumor of the pelvic area.  First dose started July 18, 2020.  Status post 8 months of treatment.  INTERVAL HISTORY: Jesse [id number].o. male returns to the clinic today for follow-up visit.  The patient is feeling fine today with no concerning complaints except for fatigue from the treatment with Gleevec.  He denied having any current chest pain, shortness of breath, cough or hemoptysis.  He denied having any fever or chills.  He has no nausea, vomiting, diarrhea or constipation.  He has no headache or visual changes.  He denied having any significant weight loss or night sweats.  He had repeat CT scan of the chest, abdomen and pelvis performed recently and is here for evaluation and discussion of his scan results.  MEDICAL HISTORY: Past Medical History:   Diagnosis Date   Anemia    Cancer (Jesse [name]    bladder cancer   [place]    mild    DIVERTICULOSIS, COLON 01/14/2007   Qualifier: Diagnosis of  By: MPaulina FusiRN, JDaine Gravel   GASTRITIS, CHRONIC 06/01/2004   Qualifier: Diagnosis of  By: SNolon RodCMA (AAMA), Robin     GERD 01/09/2007   Qualifier: Diagnosis of  By: TSherren Mocha RN, Ellen     Heart murmur    as a child; no murmur heard 12/14/19   HYPERLIPIDEMIA 01/09/2007   Qualifier: Diagnosis of  By: TSherren MochaRN, EDorian Pod    Hypertension    Lung mass    left upper nodule   Macular degeneration    right eye   NEOPLASM, MALIGNANT, BLADDER, HX OF 2002   Qualifier: Diagnosis of  By: SNolon RodCMA (AAMA), Robin  / no chemo or radiation   OSTEOARTHRITIS 01/14/2007   Qualifier: Diagnosis of  By: MPaulina Fusi RN, JDaine Gravel- knees   Rectal mass    Reflux esophagitis 06/01/2004   Qualifier: Diagnosis of  By: SNolon RodCMA (AAMA), Robin     TOBACCO USER 02/16/2009   Qualifier: Diagnosis of  By: KBurnice Logan MD, PDoretha Sou   VITAMIN D DEFICIENCY 06/03/2007   Qualifier: Diagnosis of  By: KBurnice Logan MD, PDoretha Sou    ALLERGIES:  has No Known Allergies.  MEDICATIONS:  Current Outpatient Medications  Medication Sig Dispense Refill   amLODipine (NORVASC) 5 MG tablet Take 1  tablet (5 mg total) by mouth daily. 90 tablet 1   atorvastatin (LIPITOR) 40 MG tablet Take 1/2 tablet (20 mg total) by mouth daily. 90 tablet 1   diphenhydrAMINE (BENADRYL) 25 mg capsule Take 1-2 capsules (25-50 mg total) by mouth at bedtime as needed for sleep. 30 capsule 0   ibuprofen (ADVIL) 200 MG tablet Take 200 mg by mouth every 6 (six) hours as needed.     imatinib (GLEEVEC) 400 MG tablet Take 1 tablet (400 mg total) by mouth daily. Take with meals and large glass of water.Caution:Chemotherapy. 30 tablet 3   Multiple Vitamins-Minerals (ICAPS AREDS 2 PO) Take 1 capsule by mouth 2 (two) times daily.     omeprazole (PRILOSEC) 40 MG capsule Take 1 capsule (40  mg total) by mouth daily. 90 capsule 3   Vitamin D3 (VITAMIN D) 25 MCG tablet Take 1 tablet by mouth daily.     No current facility-administered medications for this visit.    SURGICAL HISTORY:  Past Surgical History:  Procedure Laterality Date   BRONCHIAL BIOPSY  10/13/2019   Procedure: BRONCHIAL BIOPSIES;  Surgeon: Garner Nash, DO;  Location: [facility];  Service: Pulmonary;;   BRONCHIAL BRUSHINGS  10/13/2019   Procedure: BRONCHIAL BRUSHINGS;  Surgeon: Garner Nash, DO;  Location: [facility];  Service: Pulmonary;;   BRONCHIAL NEEDLE ASPIRATION BIOPSY  10/13/2019   Procedure: BRONCHIAL NEEDLE ASPIRATION BIOPSIES;  Surgeon: Garner Nash, DO;  Location: [facility];  Service: Pulmonary;;   BRONCHIAL WASHINGS  10/13/2019   Procedure: BRONCHIAL WASHINGS;  Surgeon: Garner Nash, DO;  Location: [facility];  Service: Pulmonary;;   COLONOSCOPY     multiple   INTERCOSTAL NERVE BLOCK Left 12/16/2019   Procedure: INTERCOSTAL NERVE BLOCK;  Surgeon: Lajuana Matte, MD;  Location: [facility];  Service: Thoracic;  Laterality: Left;   KNEE SURGERY  07/20/2008   bilat.   NASAL SEPTUM SURGERY  1995   NODE DISSECTION Left 12/16/2019   Procedure: NODE DISSECTION;  Surgeon: Lajuana Matte, MD;  Location: [facility];  Service: Thoracic;  Laterality: Left;   TONSILLECTOMY     TRANSURETHRAL RESECTION OF BLADDER TUMOR  2002   UPPER GASTROINTESTINAL ENDOSCOPY     VIDEO BRONCHOSCOPY WITH ENDOBRONCHIAL NAVIGATION N/A 10/13/2019   Procedure: VIDEO BRONCHOSCOPY WITH ENDOBRONCHIAL NAVIGATION;  Surgeon: Garner Nash, DO;  Location: [facility];  Service: Pulmonary;  Laterality: N/A;   WISDOM TOOTH EXTRACTION      REVIEW OF SYSTEMS:  Constitutional: positive for fatigue Eyes: negative Ears, nose, mouth, throat, and face: negative Respiratory: negative Cardiovascular: negative Gastrointestinal: negative Genitourinary:negative Integument/breast: negative Hematologic/lymphatic:  negative Musculoskeletal:negative Neurological: negative Behavioral/Psych: negative Endocrine: negative Allergic/Immunologic: negative   PHYSICAL EXAMINATION: General appearance: alert, cooperative, fatigued, and no distress Head: Normocephalic, without obvious abnormality, atraumatic Neck: no adenopathy, no JVD, supple, symmetrical, trachea midline, and thyroid not enlarged, symmetric, no tenderness/mass/nodules Lymph nodes: Cervical, supraclavicular, and axillary nodes normal. Resp: clear to auscultation bilaterally Back: symmetric, no curvature. ROM normal. No CVA tenderness. Cardio: regular rate and rhythm, S1, S2 normal, no murmur, click, rub or gallop GI: soft, non-tender; bowel sounds normal; no masses,  no organomegaly Extremities: extremities normal, atraumatic, no cyanosis or edema Neurologic: Alert and oriented X 3, normal strength and tone. Normal symmetric reflexes. Normal coordination and gait  ECOG PERFORMANCE STATUS: 1 - Symptomatic but completely ambulatory  Blood pressure 139/69, pulse 92, temperature 97.9 F (36.6 C), temperature source Tympanic, resp. rate 19, height _0  (1.753 m), weight 174  lb 11.2 oz (79.2 kg), SpO2 100 %.  LABORATORY DATA: Lab Results  Component Value Date   WBC 4.2 03/24/2021   HGB 11.8 (L) 03/24/2021   HCT 34.8 (L) 03/24/2021   MCV 100.0 03/24/2021   PLT 126 (L) 03/24/2021      Chemistry      Component Value Date/Time   NA 141 03/24/2021 0743   K 3.8 03/24/2021 0743   CL 106 03/24/2021 0743   CO2 26 03/24/2021 0743   BUN 21 03/24/2021 0743   CREATININE 0.83 03/24/2021 0743      Component Value Date/Time   CALCIUM 8.9 03/24/2021 0743   ALKPHOS 78 03/24/2021 0743   AST 22 03/24/2021 0743   ALT 17 03/24/2021 0743   BILITOT 0.7 03/24/2021 0743       RADIOGRAPHIC STUDIES: CT Chest W Contrast  Result Date: 03/25/2021 CLINICAL DATA:  Lung cancer restaging, GIST restaging, status post left upper lobectomy, chemotherapy and  XRT complete, ongoing oral chemotherapy EXAM: CT CHEST, ABDOMEN, AND PELVIS WITH CONTRAST TECHNIQUE: Multidetector CT imaging of the chest, abdomen and pelvis was performed following the standard protocol during bolus administration of intravenous contrast. CONTRAST:  60m OMNIPAQUE IOHEXOL 350 MG/ML SOLN, additional oral enteric contrast COMPARISON:  CT abdomen pelvis, 12/30/2020, CT chest abdomen pelvis, 10/11/2020 FINDINGS: CT CHEST FINDINGS Cardiovascular: Aortic atherosclerosis. Normal heart size. Three-vessel coronary artery calcifications. No pericardial effusion. Mediastinum/Nodes: No enlarged mediastinal, hilar, or axillary lymph nodes. Thyroid gland, trachea, and esophagus demonstrate no significant findings. Lungs/Pleura: Status post left upper lobectomy. Underlying mild centrilobular emphysema. Mild, diffuse bilateral bronchial wall thickening. Small pulmonary nodules of the right apex are unchanged, measuring up to 0.6 cm (series 4, image 28). Background of very fine centrilobular pulmonary nodules. No pleural effusion or pneumothorax. Musculoskeletal: No chest wall mass or suspicious bone lesions identified. CT ABDOMEN PELVIS FINDINGS Hepatobiliary: No solid liver abnormality is seen. Coarse, nodular, cirrhotic morphology of the liver. No gallstones, gallbladder wall thickening, or biliary dilatation. Pancreas: Unremarkable. No pancreatic ductal dilatation or surrounding inflammatory changes. Spleen: Normal in size without significant abnormality. Adrenals/Urinary Tract: Adrenal glands are unremarkable. Small nonobstructive calculus of the inferior pole of the right kidney (series 5, image 104). No left-sided calculi, ureteral calculi, or hydronephrosis. Bladder is unremarkable. Stomach/Bowel: Stomach is within normal limits. Appendix appears normal. No evidence of bowel wall thickening, distention, or inflammatory changes. Descending and sigmoid diverticulosis. Vascular/Lymphatic: Aortic  atherosclerosis. No enlarged abdominal or pelvic lymph nodes. Reproductive: Prostatomegaly with median lobe hypertrophy. Other: No abdominal wall hernia or abnormality. No abdominopelvic ascites. A fluid attenuation lesion of the low right hemipelvis interposed between the prostate and rectum is not significantly changed, measuring 5.2 x 3.4 cm, previously 5.0 x 3.3 cm when measured with similar technique Musculoskeletal: No acute or significant osseous findings. IMPRESSION: 1. Status post left upper lobectomy. No evidence of recurrent or metastatic disease in the chest. 2. Small pulmonary nodules of the right pulmonary apex are unchanged, almost certainly benign and incidental. Attention on follow-up. 3. Unchanged size of a fluid attenuation treated mass of the low right hemipelvis interposed between the prostate and rectum. 4. No other evidence of mass, lymphadenopathy, or metastatic disease in the abdomen or pelvis. 5. Coarse, nodular, cirrhotic morphology of the liver. 6. Nonobstructive right nephrolithiasis. 7. Emphysema. 8. Coronary artery disease. Aortic Atherosclerosis (ICD10-I70.0). Electronically Signed   By: ADelanna AhmadiM.D.   On: 03/25/2021 07:23   CT Abdomen Pelvis W Contrast  Result Date: 03/25/2021 CLINICAL DATA:  Lung  cancer restaging, GIST restaging, status post left upper lobectomy, chemotherapy and XRT complete, ongoing oral chemotherapy EXAM: CT CHEST, ABDOMEN, AND PELVIS WITH CONTRAST TECHNIQUE: Multidetector CT imaging of the chest, abdomen and pelvis was performed following the standard protocol during bolus administration of intravenous contrast. CONTRAST:  51m OMNIPAQUE IOHEXOL 350 MG/ML SOLN, additional oral enteric contrast COMPARISON:  CT abdomen pelvis, 12/30/2020, CT chest abdomen pelvis, 10/11/2020 FINDINGS: CT CHEST FINDINGS Cardiovascular: Aortic atherosclerosis. Normal heart size. Three-vessel coronary artery calcifications. No pericardial effusion. Mediastinum/Nodes: No  enlarged mediastinal, hilar, or axillary lymph nodes. Thyroid gland, trachea, and esophagus demonstrate no significant findings. Lungs/Pleura: Status post left upper lobectomy. Underlying mild centrilobular emphysema. Mild, diffuse bilateral bronchial wall thickening. Small pulmonary nodules of the right apex are unchanged, measuring up to 0.6 cm (series 4, image 28). Background of very fine centrilobular pulmonary nodules. No pleural effusion or pneumothorax. Musculoskeletal: No chest wall mass or suspicious bone lesions identified. CT ABDOMEN PELVIS FINDINGS Hepatobiliary: No solid liver abnormality is seen. Coarse, nodular, cirrhotic morphology of the liver. No gallstones, gallbladder wall thickening, or biliary dilatation. Pancreas: Unremarkable. No pancreatic ductal dilatation or surrounding inflammatory changes. Spleen: Normal in size without significant abnormality. Adrenals/Urinary Tract: Adrenal glands are unremarkable. Small nonobstructive calculus of the inferior pole of the right kidney (series 5, image 104). No left-sided calculi, ureteral calculi, or hydronephrosis. Bladder is unremarkable. Stomach/Bowel: Stomach is within normal limits. Appendix appears normal. No evidence of bowel wall thickening, distention, or inflammatory changes. Descending and sigmoid diverticulosis. Vascular/Lymphatic: Aortic atherosclerosis. No enlarged abdominal or pelvic lymph nodes. Reproductive: Prostatomegaly with median lobe hypertrophy. Other: No abdominal wall hernia or abnormality. No abdominopelvic ascites. A fluid attenuation lesion of the low right hemipelvis interposed between the prostate and rectum is not significantly changed, measuring 5.2 x 3.4 cm, previously 5.0 x 3.3 cm when measured with similar technique Musculoskeletal: No acute or significant osseous findings. IMPRESSION: 1. Status post left upper lobectomy. No evidence of recurrent or metastatic disease in the chest. 2. Small pulmonary nodules of the  right pulmonary apex are unchanged, almost certainly benign and incidental. Attention on follow-up. 3. Unchanged size of a fluid attenuation treated mass of the low right hemipelvis interposed between the prostate and rectum. 4. No other evidence of mass, lymphadenopathy, or metastatic disease in the abdomen or pelvis. 5. Coarse, nodular, cirrhotic morphology of the liver. 6. Nonobstructive right nephrolithiasis. 7. Emphysema. 8. Coronary artery disease. Aortic Atherosclerosis (ICD10-I70.0). Electronically Signed   By: ADelanna AhmadiM.D.   On: 03/25/2021 07:23    ASSESSMENT AND PLAN: This is a very pleasant 70years old white male recently diagnosed with a stage IIb (T1 a, N1, M0) non-small cell lung cancer, adenocarcinoma in June 2021 status post left upper lobectomy with lymph node dissection on December 16, 2019 under the care of Dr. LKipp Brood  The patient has no actionable mutations and PD-L1 expression was 1%. He was also diagnosed with pelvic GIST tumor. The patient completed a course of adjuvant systemic chemotherapy with cisplatin 75 mg/M2 and Alimta 500 mg/M2 every 3 weeks status post 4 cycles.  He tolerated his treatment well except for fatigue and occasional nausea. He is currently on observation for the lung cancer. For the pelvic GIST tumor, the patient is currently on neoadjuvant treatment with imatinib 400 mg p.o. daily.  Status post 8 months.   The patient has been tolerating his treatment with Gleevec fairly well except for the fatigue. He had repeat CT scan of the chest, abdomen and  pelvis that showed no concerning finding for disease progression and there was a stable size of the treated mass of the lower right hemipelvis between the prostate and rectum.  He has no painful defecation at this point. I discussed the scan results with the patient I recommended for him to continue on his current treatment with Gleevec for a total of 12 months as recommended by surgery. I will see him back  for follow-up visit in 2 months for evaluation and repeat blood work. He was advised to call immediately if he has any other concerning symptoms in the interval. The patient voices understanding of current disease status and treatment options and is in agreement with the current care plan.  All questions were answered. The patient knows to call the clinic with any problems, questions or concerns. We can certainly see the patient much sooner if necessary.   Disclaimer: This note was dictated with voice recognition software. Similar sounding words can inadvertently be transcribed and may not be corrected upon review.

## 2021-03-30 ENCOUNTER — Other Ambulatory Visit (HOSPITAL_COMMUNITY): Payer: Self-pay

## 2021-04-03 ENCOUNTER — Other Ambulatory Visit (HOSPITAL_COMMUNITY): Payer: Self-pay

## 2021-04-04 ENCOUNTER — Other Ambulatory Visit (HOSPITAL_COMMUNITY): Payer: Self-pay

## 2021-04-11 ENCOUNTER — Other Ambulatory Visit (HOSPITAL_COMMUNITY): Payer: Self-pay

## 2021-04-11 ENCOUNTER — Telehealth: Payer: Self-pay

## 2021-04-11 NOTE — Telephone Encounter (Signed)
Pt called wanting to know if it is okay for him to receive the yellow fever vaccine and take and anti-malarial drug for trip to [place] on May 05, 2021? Pt states he will be there for 2 1/2 weeks and these are the recommended travel precautions.

## 2021-04-19 DIAGNOSIS — L720 Epidermal cyst: Secondary | ICD-10-CM | POA: Diagnosis not present

## 2021-04-20 ENCOUNTER — Telehealth: Payer: Self-pay | Admitting: Family Medicine

## 2021-04-20 NOTE — Telephone Encounter (Signed)
Patient is going on vacation to [place] on the 28th of January. Travel requirements require him to have a yellow fever vaccination, and Malaria medication on hand.  Patient would like to see if Dr Volanda Napoleon is able to administer/ or if [facility] can administer.  Please contact patient at [phone]

## 2021-04-20 NOTE — Telephone Encounter (Signed)
Spoke with pt, gave information on where to get vaccination. He asked to send it to his mychart, mychart message sent.

## 2021-05-03 ENCOUNTER — Other Ambulatory Visit (HOSPITAL_COMMUNITY): Payer: Self-pay

## 2021-05-04 ENCOUNTER — Encounter: Payer: Self-pay | Admitting: Family Medicine

## 2021-05-04 ENCOUNTER — Other Ambulatory Visit (HOSPITAL_COMMUNITY): Payer: Self-pay

## 2021-05-04 ENCOUNTER — Ambulatory Visit (INDEPENDENT_AMBULATORY_CARE_PROVIDER_SITE_OTHER): Payer: Medicare Other | Admitting: Family Medicine

## 2021-05-04 VITALS — BP 118/60 | HR 85 | Temp 98.6°F | Wt 176.0 lb

## 2021-05-04 DIAGNOSIS — Z7184 Encounter for health counseling related to travel: Secondary | ICD-10-CM | POA: Diagnosis not present

## 2021-05-04 DIAGNOSIS — Z298 Encounter for other specified prophylactic measures: Secondary | ICD-10-CM

## 2021-05-04 DIAGNOSIS — C49A5 Gastrointestinal stromal tumor of rectum: Secondary | ICD-10-CM | POA: Diagnosis not present

## 2021-05-04 MED ORDER — ATOVAQUONE-PROGUANIL HCL 250-100 MG PO TABS
ORAL_TABLET | ORAL | 0 refills | Status: DC
  Filled 2021-05-04: qty 25, 25d supply, fill #0

## 2021-05-04 NOTE — Progress Notes (Signed)
Subjective:    Patient ID: Jesse [name], male    DOB: 1950-04-27, 71 y.o.   MRN: [id number]  Chief Complaint  Patient presents with   Travel Consult    Wants to discuss his trip to [place] for 15 days, and se if any meeds are needed/required    HPI Patient was seen today for travel advice.  Patient traveling to [place] on 1/29 for 15 days.  Will be staying with friends and traveling [place] to [place] city up near [place].  Patient having daily loose stools on Gleevec for GIST tumor.  Tumor initially began shrinking but has remained stable.  Patient to continue [place] until at least March 2023 which will be 1 year.   Past Medical History:  Diagnosis Date   Anemia    Cancer ([place])    bladder cancer   COPD (chronic obstructive pulmonary disease) ([place])    mild    DIVERTICULOSIS, COLON 01/14/2007   Qualifier: Diagnosis of  By: Paulina Fusi, RN, [name]    GASTRITIS, CHRONIC 06/01/2004   Qualifier: Diagnosis of  By: Nolon Rod CMA (AAMA), Robin     GERD 01/09/2007   Qualifier: Diagnosis of  By: Sherren Mocha RN, Ellen     Heart murmur    as a child; no murmur heard 12/14/19   HYPERLIPIDEMIA 01/09/2007   Qualifier: Diagnosis of  By: Scherrie Gerlach     Hypertension    Lung mass    left upper nodule   Macular degeneration    right eye   NEOPLASM, MALIGNANT, BLADDER, HX OF 2002   Qualifier: Diagnosis of  By: Nolon Rod CMA (AAMA), Robin  / no chemo or radiation   OSTEOARTHRITIS 01/14/2007   Qualifier: Diagnosis of  By: Paulina Fusi, RN, [name] - knees   Rectal mass    Reflux esophagitis 06/01/2004   Qualifier: Diagnosis of  By: Nolon Rod CMA (AAMA), Robin     TOBACCO USER 02/16/2009   Qualifier: Diagnosis of  By: Burnice Logan  MD, Doretha Sou    VITAMIN D DEFICIENCY 06/03/2007   Qualifier: Diagnosis of  By: Burnice Logan  MD, Doretha Sou     No Known Allergies  ROS General: Denies fever, chills, night sweats, changes in weight, changes in appetite HEENT: Denies headaches, ear pain, changes in vision, rhinorrhea,  sore throat CV: Denies CP, palpitations, SOB, orthopnea Pulm: Denies SOB, cough, wheezing GI: Denies abdominal pain, nausea, vomiting, constipation + loose stools GU: Denies dysuria, hematuria, frequency Msk: Denies muscle cramps, joint pains Neuro: Denies weakness, numbness, tingling Skin: Denies rashes, bruising Psych: Denies depression, anxiety, hallucinations     Objective:    Blood pressure 118/60, pulse 85, temperature 98.6 F (37 C), temperature source Oral, weight 176 lb (79.8 kg), SpO2 98 %.  Gen. Pleasant, well-nourished, in no distress, normal affect   HEENT: [place]/AT, face symmetric, conjunctiva clear, no scleral icterus, PERRLA, EOMI, nares patent without drainage Lungs: no accessory muscle use, CTAB, no wheezes or rales Cardiovascular: RRR, no m/r/g, no peripheral edema Musculoskeletal: No deformities, no cyanosis or clubbing, normal tone Neuro:  A&Ox3, CN II-XII intact, normal gait Skin:  Warm, no lesions/ rash   Wt Readings from Last 3 Encounters:  05/04/21 176 lb (79.8 kg)  03/27/21 174 lb 11.2 oz (79.2 kg)  02/13/21 174 lb 1.6 oz (79 kg)    Lab Results  Component Value Date   WBC 4.2 03/24/2021   HGB 11.8 (L) 03/24/2021   HCT 34.8 (L) 03/24/2021   PLT 126 (L) 03/24/2021  GLUCOSE 107 (H) 03/24/2021   CHOL 191 10/22/2018   TRIG 211.0 (H) 10/22/2018   HDL 42.50 10/22/2018   LDLDIRECT 102.0 10/22/2018   ALT 17 03/24/2021   AST 22 03/24/2021   NA 141 03/24/2021   K 3.8 03/24/2021   CL 106 03/24/2021   CREATININE 0.83 03/24/2021   BUN 21 03/24/2021   CO2 26 03/24/2021   TSH 1.10 10/24/2020   PSA 3.12 10/24/2020   INR 0.9 12/14/2019   HGBA1C 5.8 10/22/2018    Assessment/Plan:  Travel advice encounter -Discussed mosquito repellent, drinking bottled water, drinks without ice and avoiding unknown animals. -Reviewed immunizations.  Yellow fever vaccine not indicated.  Tdap due 2024. -Consider typhoid and Hep A vaccines, though leaving for trip fairly  soon. -given precautions  Need for malaria prophylaxis  -Reviewed various medication options for malaria prophylaxis. -Will start taking Malarone daily 2 days prior to trip, daily during trip, and for 7 days after returning from trip. 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

## 2021-05-05 ENCOUNTER — Other Ambulatory Visit (HOSPITAL_COMMUNITY): Payer: Self-pay

## 2021-05-08 ENCOUNTER — Other Ambulatory Visit (HOSPITAL_COMMUNITY): Payer: Self-pay

## 2021-05-10 ENCOUNTER — Other Ambulatory Visit (HOSPITAL_COMMUNITY): Payer: Self-pay

## 2021-05-11 ENCOUNTER — Telehealth: Payer: Self-pay | Admitting: Internal Medicine

## 2021-05-11 NOTE — Telephone Encounter (Signed)
Sch per 1/25 inbasket msg, pt aware

## 2021-05-30 ENCOUNTER — Other Ambulatory Visit (HOSPITAL_COMMUNITY): Payer: Self-pay

## 2021-05-30 ENCOUNTER — Other Ambulatory Visit: Payer: Self-pay | Admitting: Internal Medicine

## 2021-05-30 DIAGNOSIS — C49A5 Gastrointestinal stromal tumor of rectum: Secondary | ICD-10-CM

## 2021-05-30 MED ORDER — IMATINIB MESYLATE 400 MG PO TABS
400.0000 mg | ORAL_TABLET | Freq: Every day | ORAL | 3 refills | Status: DC
  Filled 2021-05-30: qty 30, 30d supply, fill #0
  Filled 2021-07-25: qty 30, 30d supply, fill #1
  Filled 2021-09-04: qty 30, 30d supply, fill #2
  Filled 2021-09-21: qty 30, 30d supply, fill #3

## 2021-05-31 ENCOUNTER — Other Ambulatory Visit (HOSPITAL_COMMUNITY): Payer: Self-pay

## 2021-06-01 DIAGNOSIS — L720 Epidermal cyst: Secondary | ICD-10-CM | POA: Diagnosis not present

## 2021-06-05 ENCOUNTER — Other Ambulatory Visit (HOSPITAL_COMMUNITY): Payer: Self-pay

## 2021-06-07 ENCOUNTER — Other Ambulatory Visit: Payer: Self-pay

## 2021-06-07 ENCOUNTER — Inpatient Hospital Stay: Payer: Medicare Other | Attending: Internal Medicine

## 2021-06-07 ENCOUNTER — Inpatient Hospital Stay: Payer: Medicare Other | Admitting: Internal Medicine

## 2021-06-07 VITALS — BP 132/67 | HR 83 | Temp 97.1°F | Resp 19 | Ht 69.0 in | Wt 173.2 lb

## 2021-06-07 DIAGNOSIS — Z8551 Personal history of malignant neoplasm of bladder: Secondary | ICD-10-CM | POA: Insufficient documentation

## 2021-06-07 DIAGNOSIS — C3492 Malignant neoplasm of unspecified part of left bronchus or lung: Secondary | ICD-10-CM | POA: Diagnosis not present

## 2021-06-07 DIAGNOSIS — Z79899 Other long term (current) drug therapy: Secondary | ICD-10-CM | POA: Diagnosis not present

## 2021-06-07 DIAGNOSIS — J449 Chronic obstructive pulmonary disease, unspecified: Secondary | ICD-10-CM | POA: Diagnosis not present

## 2021-06-07 DIAGNOSIS — C49A5 Gastrointestinal stromal tumor of rectum: Secondary | ICD-10-CM | POA: Diagnosis not present

## 2021-06-07 DIAGNOSIS — Z5111 Encounter for antineoplastic chemotherapy: Secondary | ICD-10-CM

## 2021-06-07 DIAGNOSIS — C49A9 Gastrointestinal stromal tumor of other sites: Secondary | ICD-10-CM | POA: Diagnosis not present

## 2021-06-07 DIAGNOSIS — C3412 Malignant neoplasm of upper lobe, left bronchus or lung: Secondary | ICD-10-CM | POA: Diagnosis not present

## 2021-06-07 DIAGNOSIS — R110 Nausea: Secondary | ICD-10-CM | POA: Diagnosis not present

## 2021-06-07 DIAGNOSIS — C3490 Malignant neoplasm of unspecified part of unspecified bronchus or lung: Secondary | ICD-10-CM

## 2021-06-07 LAB — CBC WITH DIFFERENTIAL (CANCER CENTER ONLY)
Abs Immature Granulocytes: 0.02 10*3/uL (ref 0.00–0.07)
Basophils Absolute: 0 10*3/uL (ref 0.0–0.1)
Basophils Relative: 0 %
Eosinophils Absolute: 0.2 10*3/uL (ref 0.0–0.5)
Eosinophils Relative: 3 %
HCT: 32.4 % — ABNORMAL LOW (ref 39.0–52.0)
Hemoglobin: 11.1 g/dL — ABNORMAL LOW (ref 13.0–17.0)
Immature Granulocytes: 0 %
Lymphocytes Relative: 18 %
Lymphs Abs: 0.9 10*3/uL (ref 0.7–4.0)
MCH: 34.2 pg — ABNORMAL HIGH (ref 26.0–34.0)
MCHC: 34.3 g/dL (ref 30.0–36.0)
MCV: 99.7 fL (ref 80.0–100.0)
Monocytes Absolute: 0.3 10*3/uL (ref 0.1–1.0)
Monocytes Relative: 5 %
Neutro Abs: 3.5 10*3/uL (ref 1.7–7.7)
Neutrophils Relative %: 74 %
Platelet Count: 126 10*3/uL — ABNORMAL LOW (ref 150–400)
RBC: 3.25 MIL/uL — ABNORMAL LOW (ref 4.22–5.81)
RDW: 14.3 % (ref 11.5–15.5)
WBC Count: 4.8 10*3/uL (ref 4.0–10.5)
nRBC: 0 % (ref 0.0–0.2)

## 2021-06-07 LAB — CMP (CANCER CENTER ONLY)
ALT: 20 U/L (ref 0–44)
AST: 26 U/L (ref 15–41)
Albumin: 4 g/dL (ref 3.5–5.0)
Alkaline Phosphatase: 87 U/L (ref 38–126)
Anion gap: 4 — ABNORMAL LOW (ref 5–15)
BUN: 24 mg/dL — ABNORMAL HIGH (ref 8–23)
CO2: 29 mmol/L (ref 22–32)
Calcium: 9.1 mg/dL (ref 8.9–10.3)
Chloride: 107 mmol/L (ref 98–111)
Creatinine: 0.9 mg/dL (ref 0.61–1.24)
GFR, Estimated: >60 mL/min (ref >60)
Glucose, Bld: 113 mg/dL — ABNORMAL HIGH (ref 70–99)
Potassium: 3.9 mmol/L (ref 3.5–5.1)
Sodium: 140 mmol/L (ref 135–145)
Total Bilirubin: 0.5 mg/dL (ref 0.3–1.2)
Total Protein: 6.2 g/dL — ABNORMAL LOW (ref 6.5–8.1)

## 2021-06-07 NOTE — Progress Notes (Signed)
[address] Telephone:([phone]   Fax:(336) [phone]  OFFICE PROGRESS NOTE  [name], MD [place] [address]  DIAGNOSIS:  1) Stage IIB (T1 a, N1, M0) non-small cell lung cancer, adenocarcinoma diagnosed in June 2021. 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  Biomarker Findings Tumor Mutational Burden - 20 Muts/Mb Microsatellite status - MS-Stable Genomic Findings For a complete list of the genes assayed, please refer to the Appendix. KEAP1 I47f*17 [id number] RB1 splice site 13888+7N>[id number]* 8 Disease relevant genes with no reportable alterations: ALK, BRAF, EGFR, ERBB2, KRAS, MET, RET, ROS1  PDL1 Expression: 1 %  PRIOR THERAPY:  1) status post left upper lobectomy with lymph node dissection on December 16, 2019 under the care of Dr. LKipp Brood  The tumor size measured 0.9 cm but there was involvement of the level 10 L and 11 L.  2) Adjuvant systemic chemotherapy with cisplatin 75 mg/M2 and Alimta 500 mg/M2 every 3 weeks.  First dose February 04, 2020.  Status post 4 cycles.  CURRENT THERAPY: Neoadjuvant treatment with imatinib 400 mg p.o. daily for the GIST tumor of the pelvic area.  First dose started July 18, 2020.  Status post 10 months of treatment.  INTERVAL HISTORY: Jesse SCHUBRING71y.o. male returns to the clinic today for follow-up visit.  The patient is feeling fine today with no concerning complaints.  He denied having any chest pain, shortness of breath, cough or hemoptysis.  He has no nausea, vomiting, but has few episodes of diarrhea from treatment with imatinib.  He has no recent weight loss or night sweats.  He has no headache or visual changes.  He is here today for evaluation and repeat blood work.   MEDICAL HISTORY: Past Medical History:  Diagnosis Date   Anemia    [place]    bladder cancer   COPD (chronic obstructive pulmonary disease) ([facility]    mild    DIVERTICULOSIS, COLON  01/14/2007   Qualifier: Diagnosis of  By: MPaulina FusiRN, JDaine Gravel   GASTRITIS, CHRONIC 06/01/2004   Qualifier: Diagnosis of  By: SNolon RodCMA (AAMA), Robin     GERD 01/09/2007   Qualifier: Diagnosis of  By: TSherren Mocha RN, Ellen     Heart murmur    as a child; no murmur heard 12/14/19   HYPERLIPIDEMIA 01/09/2007   Qualifier: Diagnosis of  By: TSherren MochaRN, EDorian Pod    Hypertension    Lung mass    left upper nodule   Macular degeneration    right eye   NEOPLASM, MALIGNANT, BLADDER, HX OF 2002   Qualifier: Diagnosis of  By: SNolon RodCMA (AAMA), Robin  / no chemo or radiation   OSTEOARTHRITIS 01/14/2007   Qualifier: Diagnosis of  By: MPaulina Fusi RN, JDaine Gravel- knees   Rectal mass    Reflux esophagitis 06/01/2004   Qualifier: Diagnosis of  By: SNolon RodCMA (AAMA), Robin     TOBACCO USER 02/16/2009   Qualifier: Diagnosis of  By: KBurnice Logan MD, PDoretha Sou   VITAMIN D DEFICIENCY 06/03/2007   Qualifier: Diagnosis of  By: KBurnice Logan MD, PDoretha Sou    ALLERGIES:  has No Known Allergies.  MEDICATIONS:  Current Outpatient Medications  Medication Sig Dispense Refill   amLODipine (NORVASC) 5 MG tablet Take 1 tablet (5 mg total) by mouth daily. 90 tablet 1   atorvastatin (LIPITOR) 40 MG tablet Take 1/2 tablet (20 mg total) by  mouth daily. 90 tablet 1   atovaquone-proguanil (MALARONE) 250-100 MG TABS tablet Take one tablet by mouth daily starting two days prior to your trip, continue one tablet daily during trip and for seven days after returning from trip. 25 tablet 0   diphenhydrAMINE (BENADRYL) 25 mg capsule Take 1-2 capsules (25-50 mg total) by mouth at bedtime as needed for sleep. 30 capsule 0   ibuprofen (ADVIL) 200 MG tablet Take 200 mg by mouth every 6 (six) hours as needed.     imatinib (GLEEVEC) 400 MG tablet Take 1 tablet (400 mg total) by mouth daily. Take with meals and large glass of water.Caution:Chemotherapy. 30 tablet 3   Multiple Vitamins-Minerals (ICAPS AREDS 2 PO) Take 1 capsule by mouth 2 (two)  times daily.     omeprazole (PRILOSEC) 40 MG capsule Take 1 capsule (40 mg total) by mouth daily. 90 capsule 3   Vitamin D3 (VITAMIN D) 25 MCG tablet Take 1 tablet by mouth daily.     No current facility-administered medications for this visit.    SURGICAL HISTORY:  Past Surgical History:  Procedure Laterality Date   BRONCHIAL BIOPSY  10/13/2019   Procedure: BRONCHIAL BIOPSIES;  Surgeon: Garner Nash, DO;  Location: [facility];  Service: Pulmonary;;   BRONCHIAL BRUSHINGS  10/13/2019   Procedure: BRONCHIAL BRUSHINGS;  Surgeon: Garner Nash, DO;  Location: [facility];  Service: Pulmonary;;   BRONCHIAL NEEDLE ASPIRATION BIOPSY  10/13/2019   Procedure: BRONCHIAL NEEDLE ASPIRATION BIOPSIES;  Surgeon: Garner Nash, DO;  Location: [facility];  Service: Pulmonary;;   BRONCHIAL WASHINGS  10/13/2019   Procedure: BRONCHIAL WASHINGS;  Surgeon: Garner Nash, DO;  Location: [facility];  Service: Pulmonary;;   COLONOSCOPY     multiple   INTERCOSTAL NERVE BLOCK Left 12/16/2019   Procedure: INTERCOSTAL NERVE BLOCK;  Surgeon: Lajuana Matte, MD;  Location: [facility];  Service: Thoracic;  Laterality: Left;   KNEE SURGERY  07/20/2008   bilat.   NASAL SEPTUM SURGERY  1995   NODE DISSECTION Left 12/16/2019   Procedure: NODE DISSECTION;  Surgeon: Lajuana Matte, MD;  Location: [facility];  Service: Thoracic;  Laterality: Left;   TONSILLECTOMY     TRANSURETHRAL RESECTION OF BLADDER TUMOR  2002   UPPER GASTROINTESTINAL ENDOSCOPY     VIDEO BRONCHOSCOPY WITH ENDOBRONCHIAL NAVIGATION N/A 10/13/2019   Procedure: VIDEO BRONCHOSCOPY WITH ENDOBRONCHIAL NAVIGATION;  Surgeon: Garner Nash, DO;  Location: [facility];  Service: Pulmonary;  Laterality: N/A;   WISDOM TOOTH EXTRACTION      REVIEW OF SYSTEMS:  A comprehensive review of systems was negative except for: Gastrointestinal: positive for diarrhea   PHYSICAL EXAMINATION: General appearance: alert, cooperative, and no distress Head:  Normocephalic, without obvious abnormality, atraumatic Neck: no adenopathy, no JVD, supple, symmetrical, trachea midline, and thyroid not enlarged, symmetric, no tenderness/mass/nodules Lymph nodes: Cervical, supraclavicular, and axillary nodes normal. Resp: clear to auscultation bilaterally Back: symmetric, no curvature. ROM normal. No CVA tenderness. Cardio: regular rate and rhythm, S1, S2 normal, no murmur, click, rub or gallop GI: soft, non-tender; bowel sounds normal; no masses,  no organomegaly Extremities: extremities normal, atraumatic, no cyanosis or edema  ECOG PERFORMANCE STATUS: 1 - Symptomatic but completely ambulatory  Blood pressure 132/67, pulse 83, temperature (!) 97.1 F (36.2 C), temperature source Tympanic, resp. rate 19, height '5\' 9"'  (1.753 m), weight 173 lb 3.2 oz (78.6 kg), SpO2 100 %.  LABORATORY DATA: Lab Results  Component Value Date   WBC 4.8 06/07/2021  HGB 11.1 (L) 06/07/2021   HCT 32.4 (L) 06/07/2021   MCV 99.7 06/07/2021   PLT 126 (L) 06/07/2021      Chemistry      Component Value Date/Time   NA 141 03/24/2021 0743   K 3.8 03/24/2021 0743   CL 106 03/24/2021 0743   CO2 26 03/24/2021 0743   BUN 21 03/24/2021 0743   CREATININE 0.83 03/24/2021 0743      Component Value Date/Time   CALCIUM 8.9 03/24/2021 0743   ALKPHOS 78 03/24/2021 0743   AST 22 03/24/2021 0743   ALT 17 03/24/2021 0743   BILITOT 0.7 03/24/2021 0743       RADIOGRAPHIC STUDIES: No results found.  ASSESSMENT AND PLAN: This is a very pleasant 71 years old white male recently diagnosed with a stage IIb (T1 a, N1, M0) non-small cell lung cancer, adenocarcinoma in June 2021 status post left upper lobectomy with lymph node dissection on December 16, 2019 under the care of Dr. Kipp Brood.  The patient has no actionable mutations and PD-L1 expression was 1%. He was also diagnosed with pelvic GIST tumor. The patient completed a course of adjuvant systemic chemotherapy with cisplatin  75 mg/M2 and Alimta 500 mg/M2 every 3 weeks status post 4 cycles.  He tolerated his treatment well except for fatigue and occasional nausea. For the pelvic GIST tumor, the patient is currently on neoadjuvant treatment with imatinib 400 mg p.o. daily.  Status post 10 months.   The patient continues to tolerate his treatment with Gleevec fairly well with no concerning adverse effect except for occasional episodes of diarrhea. I recommended for him to complete the 1 year of treatment with [place] which is expected to end in early April 2023. I will arrange for the patient to have CT scan of the chest as well as MRI of the pelvis for restaging of his disease. He will come back for follow-up visit in around 6 weeks for evaluation and discussion of his lab and scan results before referring him to Dr. Johney Maine for surgical evaluation. The patient was advised to call immediately if he has any other concerning symptoms in the interval. The patient voices understanding of current disease status and treatment options and is in agreement with the current care plan.  All questions were answered. The patient knows to call the clinic with any problems, questions or concerns. We can certainly see the patient much sooner if necessary.   Disclaimer: This note was dictated with voice recognition software. Similar sounding words can inadvertently be transcribed and may not be corrected upon review.

## 2021-06-08 ENCOUNTER — Telehealth: Payer: Self-pay | Admitting: Internal Medicine

## 2021-06-08 NOTE — Telephone Encounter (Signed)
Scheduled appointment per 02/22 los. Left message with appointment details.

## 2021-06-14 ENCOUNTER — Other Ambulatory Visit: Payer: Self-pay | Admitting: Family Medicine

## 2021-06-14 DIAGNOSIS — I10 Essential (primary) hypertension: Secondary | ICD-10-CM

## 2021-06-22 ENCOUNTER — Telehealth: Payer: Self-pay | Admitting: Pharmacist

## 2021-06-22 NOTE — Chronic Care Management (AMB) (Unsigned)
Chronic Care Management Pharmacy Assistant   Name: Jesse [name]  MRN: [id number] DOB: [birth year]  Reason for Encounter: Disease State / Hypertension Assessment Call   Conditions to be addressed/monitored: HTN  Recent office visits:  05/04/2021 Grier Mitts MD - Patient was seen for travel advice and additional issues. Started Malarone 250/100 mg. Take one tablet by mouth daily starting two days prior to your trip, continue one tablet daily during trip and for seven days after returning from trip. No follow up noted.    Recent consult visits:  06/07/2021 Curt Bears MD (oncology) - Patient was seem for Malignant neoplasm of unspecified part of unspecified bronchus or lung and additional issues. No medication changes. Follow up in 6 weeks.  03/27/2021 Curt Bears MD (oncology) - Patient was seem for Adenocarcinoma of left lung, stage 2 and additional issues. No follow up noted.   Hospital visits:  None  Medications: Outpatient Encounter Medications as of 06/22/2021  Medication Sig   amLODipine (NORVASC) 5 MG tablet TAKE 1 TABLET (5 MG TOTAL) BY MOUTH DAILY.   atorvastatin (LIPITOR) 40 MG tablet Take 1/2 tablet (20 mg total) by mouth daily.   atovaquone-proguanil (MALARONE) 250-100 MG TABS tablet Take one tablet by mouth daily starting two days prior to your trip, continue one tablet daily during trip and for seven days after returning from trip.   diphenhydrAMINE (BENADRYL) 25 mg capsule Take 1-2 capsules (25-50 mg total) by mouth at bedtime as needed for sleep.   ibuprofen (ADVIL) 200 MG tablet Take 200 mg by mouth every 6 (six) hours as needed.   imatinib (GLEEVEC) 400 MG tablet Take 1 tablet (400 mg total) by mouth daily. Take with meals and large glass of water.Caution:Chemotherapy.   Multiple Vitamins-Minerals (ICAPS AREDS 2 PO) Take 1 capsule by mouth 2 (two) times daily.   omeprazole (PRILOSEC) 40 MG capsule Take 1 capsule (40 mg total) by mouth daily.   Vitamin D3  (VITAMIN D) 25 MCG tablet Take 1 tablet by mouth daily.   No facility-administered encounter medications on file as of 06/22/2021.  Fill History: ATORVASTATIN 40 MG TABLET 05/20/2021 90   atovaquone-proguanil (MALARONE) 250-100 MG TABS tablet 05/10/2021 25   OMEPRAZOLE DR 40 MG CAPSULE 06/02/2021 90   AMLODIPINE BESYLATE 5 MG TAB 06/15/2021 90    Reviewed chart prior to disease state call. Spoke with patient regarding BP  Recent Office Vitals: BP Readings from Last 3 Encounters:  06/07/21 132/67  05/04/21 118/60  03/27/21 139/69   Pulse Readings from Last 3 Encounters:  06/07/21 83  05/04/21 85  03/27/21 92    Wt Readings from Last 3 Encounters:  06/07/21 173 lb 3.2 oz (78.6 kg)  05/04/21 176 lb (79.8 kg)  03/27/21 174 lb 11.2 oz (79.2 kg)     Kidney Function Lab Results  Component Value Date/Time   CREATININE 0.90 06/07/2021 09:43 AM   CREATININE 0.83 03/24/2021 07:43 AM   GFR 96.91 10/08/2019 10:07 AM   GFRNONAA >60 06/07/2021 09:43 AM   GFRAA >60 12/31/2019 02:28 PM    BMP Latest Ref Rng & Units 06/07/2021 03/24/2021 02/13/2021  Glucose 70 - 99 mg/dL 113(H) 107(H) 114(H)  BUN 8 - 23 mg/dL 24(H) 21 23  Creatinine 0.61 - 1.24 mg/dL 0.90 0.83 0.81  Sodium 135 - 145 mmol/L 140 141 139  Potassium 3.5 - 5.1 mmol/L 3.9 3.8 3.8  Chloride 98 - 111 mmol/L 107 106 107  CO2 22 - 32 mmol/L 29 26 26  Calcium 8.9 - 10.3 mg/dL 9.1 8.9 8.8(L)    Current antihypertensive regimen:  Amlodipine 5 mg daily  How often are you checking your Blood Pressure? {CHL HP BP Monitoring [phone]}  Current home BP readings: ***  What recent interventions/DTPs have been made by any provider to improve Blood Pressure control since last CPP Visit: No recent interventions   Any recent hospitalizations or ED visits since last visit with CPP? No recent hospital visits.   What diet changes have been made to improve Blood Pressure Control?  Patient follows Breakfast - patient will  have Lunch - patient will have Dinner - patient will have  What exercise is being done to improve your Blood Pressure Control?  ***  Adherence Review: Is the patient currently on ACE/ARB medication? No Does the patient have >5 day gap between last estimated fill dates? No  Care Gaps: AWV - scheduled 07/18/2021 Last BP - 132/67 on 06/07/2021   Star Rating Drug: Atorvastatin 40mg  - last filled 05/20/2021 90 DS at [facility] Pharmacist Assistant [phone]

## 2021-06-27 ENCOUNTER — Other Ambulatory Visit (HOSPITAL_COMMUNITY): Payer: Self-pay

## 2021-06-28 ENCOUNTER — Other Ambulatory Visit (HOSPITAL_COMMUNITY): Payer: Self-pay

## 2021-07-04 DIAGNOSIS — H5203 Hypermetropia, bilateral: Secondary | ICD-10-CM | POA: Diagnosis not present

## 2021-07-04 DIAGNOSIS — H524 Presbyopia: Secondary | ICD-10-CM | POA: Diagnosis not present

## 2021-07-04 DIAGNOSIS — H353213 Exudative age-related macular degeneration, right eye, with inactive scar: Secondary | ICD-10-CM | POA: Diagnosis not present

## 2021-07-04 DIAGNOSIS — H52223 Regular astigmatism, bilateral: Secondary | ICD-10-CM | POA: Diagnosis not present

## 2021-07-17 ENCOUNTER — Inpatient Hospital Stay: Payer: Medicare Other | Attending: Internal Medicine

## 2021-07-17 ENCOUNTER — Other Ambulatory Visit: Payer: Self-pay

## 2021-07-17 ENCOUNTER — Ambulatory Visit (HOSPITAL_COMMUNITY)
Admission: RE | Admit: 2021-07-17 | Discharge: 2021-07-17 | Disposition: A | Discharge status: Home / Self Care | Payer: Medicare Other | Source: Ambulatory Visit | Attending: Internal Medicine | Admitting: Internal Medicine

## 2021-07-17 DIAGNOSIS — Z85118 Personal history of other malignant neoplasm of bronchus and lung: Secondary | ICD-10-CM | POA: Diagnosis not present

## 2021-07-17 DIAGNOSIS — C3490 Malignant neoplasm of unspecified part of unspecified bronchus or lung: Secondary | ICD-10-CM

## 2021-07-17 DIAGNOSIS — K5730 Diverticulosis of large intestine without perforation or abscess without bleeding: Secondary | ICD-10-CM | POA: Diagnosis not present

## 2021-07-17 DIAGNOSIS — Z79899 Other long term (current) drug therapy: Secondary | ICD-10-CM | POA: Insufficient documentation

## 2021-07-17 DIAGNOSIS — R918 Other nonspecific abnormal finding of lung field: Secondary | ICD-10-CM | POA: Diagnosis not present

## 2021-07-17 DIAGNOSIS — C49A9 Gastrointestinal stromal tumor of other sites: Secondary | ICD-10-CM | POA: Insufficient documentation

## 2021-07-17 DIAGNOSIS — I700 Atherosclerosis of aorta: Secondary | ICD-10-CM | POA: Diagnosis not present

## 2021-07-17 DIAGNOSIS — R911 Solitary pulmonary nodule: Secondary | ICD-10-CM | POA: Diagnosis not present

## 2021-07-17 DIAGNOSIS — C3412 Malignant neoplasm of upper lobe, left bronchus or lung: Secondary | ICD-10-CM | POA: Insufficient documentation

## 2021-07-17 DIAGNOSIS — N3289 Other specified disorders of bladder: Secondary | ICD-10-CM | POA: Diagnosis not present

## 2021-07-17 DIAGNOSIS — Z9221 Personal history of antineoplastic chemotherapy: Secondary | ICD-10-CM | POA: Insufficient documentation

## 2021-07-17 DIAGNOSIS — R1903 Right lower quadrant abdominal swelling, mass and lump: Secondary | ICD-10-CM | POA: Diagnosis not present

## 2021-07-17 DIAGNOSIS — Z902 Acquired absence of lung [part of]: Secondary | ICD-10-CM | POA: Insufficient documentation

## 2021-07-17 LAB — CMP (CANCER CENTER ONLY)
ALT: 19 U/L (ref 0–44)
AST: 28 U/L (ref 15–41)
Albumin: 4.3 g/dL (ref 3.5–5.0)
Alkaline Phosphatase: 80 U/L (ref 38–126)
Anion gap: 6 (ref 5–15)
BUN: 23 mg/dL (ref 8–23)
CO2: 28 mmol/L (ref 22–32)
Calcium: 9.2 mg/dL (ref 8.9–10.3)
Chloride: 106 mmol/L (ref 98–111)
Creatinine: 0.84 mg/dL (ref 0.61–1.24)
GFR, Estimated: >60 mL/min (ref >60)
Glucose, Bld: 113 mg/dL — ABNORMAL HIGH (ref 70–99)
Potassium: 4 mmol/L (ref 3.5–5.1)
Sodium: 140 mmol/L (ref 135–145)
Total Bilirubin: 0.8 mg/dL (ref 0.3–1.2)
Total Protein: 6.9 g/dL (ref 6.5–8.1)

## 2021-07-17 LAB — CBC WITH DIFFERENTIAL (CANCER CENTER ONLY)
Abs Immature Granulocytes: 0.01 10*3/uL (ref 0.00–0.07)
Basophils Absolute: 0 10*3/uL (ref 0.0–0.1)
Basophils Relative: 1 %
Eosinophils Absolute: 0.2 10*3/uL (ref 0.0–0.5)
Eosinophils Relative: 3 %
HCT: 36.9 % — ABNORMAL LOW (ref 39.0–52.0)
Hemoglobin: 12.3 g/dL — ABNORMAL LOW (ref 13.0–17.0)
Immature Granulocytes: 0 %
Lymphocytes Relative: 21 %
Lymphs Abs: 1.1 10*3/uL (ref 0.7–4.0)
MCH: 33.4 pg (ref 26.0–34.0)
MCHC: 33.3 g/dL (ref 30.0–36.0)
MCV: 100.3 fL — ABNORMAL HIGH (ref 80.0–100.0)
Monocytes Absolute: 0.3 10*3/uL (ref 0.1–1.0)
Monocytes Relative: 5 %
Neutro Abs: 3.6 10*3/uL (ref 1.7–7.7)
Neutrophils Relative %: 70 %
Platelet Count: 151 10*3/uL (ref 150–400)
RBC: 3.68 MIL/uL — ABNORMAL LOW (ref 4.22–5.81)
RDW: 14 % (ref 11.5–15.5)
WBC Count: 5.2 10*3/uL (ref 4.0–10.5)
nRBC: 0 % (ref 0.0–0.2)

## 2021-07-17 MED ORDER — IOHEXOL 300 MG/ML  SOLN
75.0000 mL | Freq: Once | INTRAMUSCULAR | Status: AC | PRN
  Administered 2021-07-17: 75 mL via INTRAVENOUS

## 2021-07-17 MED ORDER — SODIUM CHLORIDE (PF) 0.9 % IJ SOLN
INTRAMUSCULAR | Status: AC
  Filled 2021-07-17: qty 50

## 2021-07-17 MED ORDER — GADOBUTROL 1 MMOL/ML IV SOLN
8.0000 mL | Freq: Once | INTRAVENOUS | Status: AC | PRN
  Administered 2021-07-17: 8 mL via INTRAVENOUS

## 2021-07-18 ENCOUNTER — Ambulatory Visit (INDEPENDENT_AMBULATORY_CARE_PROVIDER_SITE_OTHER): Payer: Medicare Other

## 2021-07-18 VITALS — Ht 69.0 in | Wt 170.0 lb

## 2021-07-18 DIAGNOSIS — Z0000 Encounter for general adult medical examination without abnormal findings: Secondary | ICD-10-CM

## 2021-07-18 NOTE — Progress Notes (Signed)
?I connected with Jesse [name] today by telephone and verified that I am speaking with the correct person using two identifiers. ?Location patient: home ?Location provider: work ?Persons participating in the virtual visit: Jesse [name], Amparo LPN. ?  ?I discussed the limitations, risks, security and privacy concerns of performing an evaluation and management service by telephone and the availability of in person appointments. I also discussed with the patient that there may be a patient responsible charge related to this service. The patient expressed understanding and verbally consented to this telephonic visit.  ?  ?Interactive audio and video telecommunications were attempted between this provider and patient, however failed, due to patient having technical difficulties OR patient did not have access to video capability.  We continued and completed visit with audio only. ? ?  ? ?Vital signs may be patient reported or missing. ? ?Subjective:  ? Jesse [name] is a 71 y.o. male who presents for Medicare Annual/Subsequent preventive examination. ? ?Review of Systems    ? ?Cardiac Risk Factors include: advanced age (>26men, >43 women);dyslipidemia;hypertension ? ?   ?Objective:  ?  ?Today's Vitals  ? 07/18/21 0911  ?Weight: 170 lb (77.1 kg)  ?Height: 5\' 9"  (1.753 m)  ? ?Body mass index is 25.1 kg/m?. ? ? ?  07/18/2021  ?  9:16 AM 02/13/2021  ?  3:35 PM 11/22/2020  ?  9:01 AM 07/12/2020  ?  8:57 AM 04/07/2020  ?  9:07 AM 03/17/2020  ?  8:38 AM 02/25/2020  ?  8:56 AM  ?Advanced Directives  ?Does Patient Have a Medical Advance Directive? Yes No No Yes No No No  ?Type of Paramedic of [place];Living will   Healthcare Power of Attorney     ?Does patient want to make changes to medical advance directive?     No - Patient declined No - Patient declined No - Patient declined  ?Copy of [place] in Chart? No - copy requested   No - copy requested     ?Would patient like  information on creating a medical advance directive?   No - Patient declined    No - Patient declined  ? ? ?Current Medications (verified) ?Outpatient Encounter Medications as of 07/18/2021  ?Medication Sig  ? amLODipine (NORVASC) 5 MG tablet TAKE 1 TABLET (5 MG TOTAL) BY MOUTH DAILY.  ? atorvastatin (LIPITOR) 40 MG tablet Take 1/2 tablet (20 mg total) by mouth daily.  ? diphenhydrAMINE (BENADRYL) 25 mg capsule Take 1-2 capsules (25-50 mg total) by mouth at bedtime as needed for sleep.  ? ibuprofen (ADVIL) 200 MG tablet Take 200 mg by mouth every 6 (six) hours as needed.  ? imatinib (GLEEVEC) 400 MG tablet Take 1 tablet (400 mg total) by mouth daily. Take with meals and large glass of water.Caution:Chemotherapy.  ? Multiple Vitamins-Minerals (ICAPS AREDS 2 PO) Take 1 capsule by mouth 2 (two) times daily.  ? omeprazole (PRILOSEC) 40 MG capsule Take 1 capsule (40 mg total) by mouth daily.  ? Vitamin D3 (VITAMIN D) 25 MCG tablet Take 1 tablet by mouth daily.  ? atovaquone-proguanil (MALARONE) 250-100 MG TABS tablet Take one tablet by mouth daily starting two days prior to your trip, continue one tablet daily during trip and for seven days after returning from trip.  ? ?No facility-administered encounter medications on file as of 07/18/2021.  ? ? ?Allergies (verified) ?Patient has no known allergies.  ? ?History: ?Past Medical History:  ?Diagnosis Date  ? Anemia   ?  Cancer [facility])   ? bladder cancer  ? COPD (chronic obstructive pulmonary disease) ([place])   ? mild   ? DIVERTICULOSIS, COLON 01/14/2007  ? Qualifier: Diagnosis of  By: Paulina Fusi RN, [name]   ? GASTRITIS, CHRONIC 06/01/2004  ? Qualifier: Diagnosis of  By: Nolon Rod CMA Deborra Medina), Robin    ? GERD 01/09/2007  ? Qualifier: Diagnosis of  By: Scherrie Gerlach    ? Heart murmur   ? as a child; no murmur heard 12/14/19  ? HYPERLIPIDEMIA 01/09/2007  ? Qualifier: Diagnosis of  By: Scherrie Gerlach    ? Hypertension   ? Lung mass   ? left upper nodule  ? Macular degeneration   ? right  eye  ? NEOPLASM, MALIGNANT, BLADDER, HX OF 2002  ? Qualifier: Diagnosis of  By: Nolon Rod CMA (AAMA), Robin  / no chemo or radiation  ? OSTEOARTHRITIS 01/14/2007  ? Qualifier: Diagnosis of  By: Paulina Fusi RN, [name] - knees  ? Rectal mass   ? Reflux esophagitis 06/01/2004  ? Qualifier: Diagnosis of  By: Nolon Rod CMA Deborra Medina), Robin    ? TOBACCO USER 02/16/2009  ? Qualifier: Diagnosis of  By: Burnice Logan  MD, Doretha Sou   ? VITAMIN D DEFICIENCY 06/03/2007  ? Qualifier: Diagnosis of  By: Burnice Logan  MD, Doretha Sou   ? ?Past Surgical History:  ?Procedure Laterality Date  ? BRONCHIAL BIOPSY  10/13/2019  ? Procedure: BRONCHIAL BIOPSIES;  Surgeon: Garner Nash, DO;  Location: [facility];  Service: Pulmonary;;  ? BRONCHIAL BRUSHINGS  10/13/2019  ? Procedure: BRONCHIAL BRUSHINGS;  Surgeon: Garner Nash, DO;  Location: [facility];  Service: Pulmonary;;  ? BRONCHIAL NEEDLE ASPIRATION BIOPSY  10/13/2019  ? Procedure: BRONCHIAL NEEDLE ASPIRATION BIOPSIES;  Surgeon: Garner Nash, DO;  Location: [facility];  Service: Pulmonary;;  ? BRONCHIAL WASHINGS  10/13/2019  ? Procedure: BRONCHIAL WASHINGS;  Surgeon: Garner Nash, DO;  Location: [facility];  Service: Pulmonary;;  ? COLONOSCOPY    ? multiple  ? INTERCOSTAL NERVE BLOCK Left 12/16/2019  ? Procedure: INTERCOSTAL NERVE BLOCK;  Surgeon: Lajuana Matte, MD;  Location: [facility];  Service: Thoracic;  Laterality: Left;  ? KNEE SURGERY  07/20/2008  ? bilat.  ? NASAL SEPTUM SURGERY  1995  ? NODE DISSECTION Left 12/16/2019  ? Procedure: NODE DISSECTION;  Surgeon: Lajuana Matte, MD;  Location: [facility];  Service: Thoracic;  Laterality: Left;  ? TONSILLECTOMY    ? TRANSURETHRAL RESECTION OF BLADDER TUMOR  2002  ? UPPER GASTROINTESTINAL ENDOSCOPY    ? VIDEO BRONCHOSCOPY WITH ENDOBRONCHIAL NAVIGATION N/A 10/13/2019  ? Procedure: VIDEO BRONCHOSCOPY WITH ENDOBRONCHIAL NAVIGATION;  Surgeon: Garner Nash, DO;  Location: [facility];  Service: Pulmonary;  Laterality: N/A;  ?  WISDOM TOOTH EXTRACTION    ? ?Family History  ?Problem Relation Age of Onset  ? Dementia Mother   ? Diabetes Father   ? Mental illness Father   ? Peripheral vascular disease Brother   ? ?Social History  ? ?Socioeconomic History  ? Marital status: Married  ?  Spouse name: Not on file  ? Number of children: 3  ? Years of education: Not on file  ? Highest education level: Not on file  ?Occupational History  ?  Comment: semi retired   ?Tobacco Use  ? Smoking status: Former  ?  Packs/day: 0.50  ?  Years: 40.00  ?  Pack years: 20.00  ?  Types: Cigarettes  ?  Quit date: 09/21/2019  ?  Years since quitting: 1.8  ? Smokeless tobacco: Never  ?Vaping Use  ? Vaping Use: Never used  ?Substance and Sexual Activity  ? Alcohol use: Yes  ?  Alcohol/week: 15.0 standard drinks  ?  Types: 15 Standard drinks or equivalent per week  ?  Comment: wine/beer/martini  ? Drug use: Yes  ?  Types: Marijuana  ?  Comment: Smokes marijuana occasional, last use in 08/2019  ? Sexual activity: Not on file  ?Other Topics Concern  ? Not on file  ?Social History Narrative  ? Married  ? 3 children: 3 grandchildren; 3 great grandchildren  ? 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 Enjoys yard work and golf  ? ?Social Determinants of Health  ? ?Financial Resource Strain: Low Risk   ? Difficulty of Paying Living Expenses: Not hard at all  ?Food Insecurity: No Food Insecurity  ? Worried About Charity fundraiser in the Last Year: Never true  ? Ran Out of Food in the Last Year: Never true  ?Transportation Needs: No Transportation Needs  ? Lack of Transportation (Medical): No  ? Lack of Transportation (Non-Medical): No  ?Physical Activity: Sufficiently Active  ? Days of Exercise per Week: 7 days  ? Minutes of Exercise per Session: 60 min  ?Stress: No Stress Concern Present  ? Feeling of Stress : Not at all  ?Social Connections: Not on file  ? ? ?Tobacco Counseling ?Counseling given: Not Answered ? ? ?Clinical Intake: ? ?Pre-visit preparation completed:  Yes ? ?Pain : No/denies pain ? ?  ? ?Nutritional Status: BMI 25 -29 Overweight ?Nutritional Risks: Nausea/ vomitting/ diarrhea (diarrhea daily due to medication) ?Diabetes: No ? ?How often do you need to have someon

## 2021-07-18 NOTE — Patient Instructions (Signed)
Jesse [name] , ?Thank you for taking time to come for your Medicare Wellness Visit. I appreciate your ongoing commitment to your health goals. Please review the following plan we discussed and let me know if I can assist you in the future.  ? ?Screening recommendations/referrals: ?Colonoscopy: not required ?Recommended yearly ophthalmology/optometry visit for glaucoma screening and checkup ?Recommended yearly dental visit for hygiene and checkup ? ?Vaccinations: ?Influenza vaccine: due next flu season ?Pneumococcal vaccine: completed 09/28/2016 ?Tdap vaccine: completed 10/07/2012, due 10/08/2022 ?Shingles vaccine: completed    ?Covid-19: 02/01/2021, 11/09/2020, 03/17/2020, 06/30/2019, 06/07/2019 ? ?Advanced directives: Please bring a copy of your POA (Power of Attorney) and/or Living Will to your next appointment.  ? ?Conditions/risks identified: none ? ?Next appointment: Follow up in one year for your annual wellness visit.  ? ?Preventive Care 76 Years and Older, Male ?Preventive care refers to lifestyle choices and visits with your health care provider that can promote health and wellness. ?What does preventive care include? ?A yearly physical exam. This is also called an annual well check. ?Dental exams once or twice a year. ?Routine eye exams. Ask your health care provider how often you should have your eyes checked. ?Personal lifestyle choices, including: ?Daily care of your teeth and gums. ?Regular physical activity. ?Eating a healthy diet. ?Avoiding tobacco and drug use. ?Limiting alcohol use. ?Practicing safe sex. ?Taking low doses of aspirin every day. ?Taking vitamin and mineral supplements as recommended by your health care provider. ?What happens during an annual well check? ?The services and screenings done by your health care provider during your annual well check will depend on your age, overall health, lifestyle risk factors, and family history of disease. ?Counseling  ?Your health care provider may ask you  questions about your: ?Alcohol use. ?Tobacco use. ?Drug use. ?Emotional well-being. ?Home and relationship well-being. ?Sexual activity. ?Eating habits. ?History of falls. ?Memory and ability to understand (cognition). ?Work and work Statistician. ?Screening  ?You may have the following tests or measurements: ?Height, weight, and BMI. ?Blood pressure. ?Lipid and cholesterol levels. These may be checked every 5 years, or more frequently if you are over 56 years old. ?Skin check. ?Lung cancer screening. You may have this screening every year starting at age 60 if you have a 30-pack-year history of smoking and currently smoke or have quit within the past 15 years. ?Fecal occult blood test (FOBT) of the stool. You may have this test every year starting at age 92. ?Flexible sigmoidoscopy or colonoscopy. You may have a sigmoidoscopy every 5 years or a colonoscopy every 10 years starting at age 6. ?Prostate cancer screening. Recommendations will vary depending on your family history and other risks. ?Hepatitis C blood test. ?Hepatitis B blood test. ?Sexually transmitted disease (STD) testing. ?Diabetes screening. This is done by checking your blood sugar (glucose) after you have not eaten for a while (fasting). You may have this done every 1-3 years. ?Abdominal aortic aneurysm (AAA) screening. You may need this if you are a current or former smoker. ?Osteoporosis. You may be screened starting at age 67 if you are at high risk. ?Talk with your health care provider about your test results, treatment options, and if necessary, the need for more tests. ?Vaccines  ?Your health care provider may recommend certain vaccines, such as: ?Influenza vaccine. This is recommended every year. ?Tetanus, diphtheria, and acellular pertussis (Tdap, Td) vaccine. You may need a Td booster every 10 years. ?Zoster vaccine. You may need this after age 6. ?Pneumococcal 13-valent conjugate (PCV13) vaccine.  One dose is recommended after age  35. ?Pneumococcal polysaccharide (PPSV23) vaccine. One dose is recommended after age 26. ?Talk to your health care provider about which screenings and vaccines you need and how often you need them. ?This information is not intended to replace advice given to you by your health care provider. Make sure you discuss any questions you have with your health care provider. ?Document Released: 04/29/2015 Document Revised: 12/21/2015 Document Reviewed: 02/01/2015 ?Elsevier Interactive Patient Education ? 2017 [employer]. ? ?Fall Prevention in the Home ?Falls can cause injuries. They can happen to people of all ages. There are many things you can do to make your home safe and to help prevent falls. ?What can I do on the outside of my home? ?Regularly fix the edges of walkways and driveways and fix any cracks. ?Remove anything that might make you trip as you walk through a door, such as a raised step or threshold. ?Trim any bushes or trees on the path to your home. ?Use bright outdoor lighting. ?Clear any walking paths of anything that might make someone trip, such as rocks or tools. ?Regularly check to see if handrails are loose or broken. Make sure that both sides of any steps have handrails. ?Any raised decks and porches should have guardrails on the edges. ?Have any leaves, snow, or ice cleared regularly. ?Use sand or salt on walking paths during winter. ?Clean up any spills in your garage right away. This includes oil or grease spills. ?What can I do in the bathroom? ?Use night lights. ?Install grab bars by the toilet and in the tub and shower. Do not use towel bars as grab bars. ?Use non-skid mats or decals in the tub or shower. ?If you need to sit down in the shower, use a plastic, non-slip stool. ?Keep the floor dry. Clean up any water that spills on the floor as soon as it happens. ?Remove soap buildup in the tub or shower regularly. ?Attach bath mats securely with double-sided non-slip rug tape. ?Do not have throw  rugs and other things on the floor that can make you trip. ?What can I do in the bedroom? ?Use night lights. ?Make sure that you have a light by your bed that is easy to reach. ?Do not use any sheets or blankets that are too big for your bed. They should not hang down onto the floor. ?Have a firm chair that has side arms. You can use this for support while you get dressed. ?Do not have throw rugs and other things on the floor that can make you trip. ?What can I do in the kitchen? ?Clean up any spills right away. ?Avoid walking on wet floors. ?Keep items that you use a lot in easy-to-reach places. ?If you need to reach something above you, use a strong step stool that has a grab bar. ?Keep electrical cords out of the way. ?Do not use floor polish or wax that makes floors slippery. If you must use wax, use non-skid floor wax. ?Do not have throw rugs and other things on the floor that can make you trip. ?What can I do with my stairs? ?Do not leave any items on the stairs. ?Make sure that there are handrails on both sides of the stairs and use them. Fix handrails that are broken or loose. Make sure that handrails are as long as the stairways. ?Check any carpeting to make sure that it is firmly attached to the stairs. Fix any carpet that is loose or  worn. ?Avoid having throw rugs at the top or bottom of the stairs. If you do have throw rugs, attach them to the floor with carpet tape. ?Make sure that you have a light switch at the top of the stairs and the bottom of the stairs. If you do not have them, ask someone to add them for you. ?What else can I do to help prevent falls? ?Wear shoes that: ?Do not have high heels. ?Have rubber bottoms. ?Are comfortable and fit you well. ?Are closed at the toe. Do not wear sandals. ?If you use a stepladder: ?Make sure that it is fully opened. Do not climb a closed stepladder. ?Make sure that both sides of the stepladder are locked into place. ?Ask someone to hold it for you, if  possible. ?Clearly mark and make sure that you can see: ?Any grab bars or handrails. ?First and last steps. ?Where the edge of each step is. ?Use tools that help you move around (mobility aids) if they are needed.

## 2021-07-20 ENCOUNTER — Other Ambulatory Visit (HOSPITAL_COMMUNITY): Payer: Self-pay

## 2021-07-20 ENCOUNTER — Other Ambulatory Visit: Payer: Self-pay

## 2021-07-20 ENCOUNTER — Inpatient Hospital Stay: Payer: Medicare Other | Admitting: Internal Medicine

## 2021-07-20 VITALS — BP 126/76 | HR 63 | Temp 98.3°F | Resp 17 | Wt 171.3 lb

## 2021-07-20 DIAGNOSIS — Z902 Acquired absence of lung [part of]: Secondary | ICD-10-CM | POA: Diagnosis not present

## 2021-07-20 DIAGNOSIS — C3490 Malignant neoplasm of unspecified part of unspecified bronchus or lung: Secondary | ICD-10-CM

## 2021-07-20 DIAGNOSIS — Z79899 Other long term (current) drug therapy: Secondary | ICD-10-CM | POA: Diagnosis not present

## 2021-07-20 DIAGNOSIS — C3412 Malignant neoplasm of upper lobe, left bronchus or lung: Secondary | ICD-10-CM | POA: Diagnosis not present

## 2021-07-20 DIAGNOSIS — C49A9 Gastrointestinal stromal tumor of other sites: Secondary | ICD-10-CM | POA: Diagnosis not present

## 2021-07-20 DIAGNOSIS — Z9221 Personal history of antineoplastic chemotherapy: Secondary | ICD-10-CM | POA: Diagnosis not present

## 2021-07-20 NOTE — Progress Notes (Signed)
?    [facility] ?[phone]   [phone] ? ?OFFICE PROGRESS NOTE ? ?[name], MD ?[place] ?[place] [address] ? ?DIAGNOSIS:  ?1) Stage IIB (T1 a, N1, M0) non-small cell lung cancer, adenocarcinoma diagnosed in June 2021. 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 ? ?Biomarker Findings ?Tumor Mutational Burden - 20 Muts/Mb ?Microsatellite status - MS-Stable ?Genomic Findings ?For a complete list of the genes assayed, please refer to the Appendix. ?KEAP1 I436f*17 ?DNMT3A E817* ?RB1 splice site 18768+1L>X?TP53 R209* ?8 Disease relevant genes with no reportable alterations: ALK, BRAF, ?EGFR, ERBB2, KRAS, MET, RET, ROS1 ? ?PDL1 Expression: 1 % ? ?PRIOR THERAPY:  ?1) status post left upper lobectomy with lymph node dissection on December 16, 2019 under the care of Dr. LKipp Brood  The tumor size measured 0.9 cm but there was involvement of the level 10 L and 11 L.  ?2) Adjuvant systemic chemotherapy with cisplatin 75 mg/M2 and Alimta 500 mg/M2 every 3 weeks.  First dose February 04, 2020.  Status post 4 cycles. ?3) Neoadjuvant treatment with imatinib 400 mg p.o. daily for the GIST tumor of the pelvic area.  First dose started July 18, 2020.  Status post 12 months of treatment. ? ?CURRENT THERAPY: Observation ? ?INTERVAL HISTORY: ?Jesse [id number].[id number]. male returns to the clinic today for follow-up visit accompanied by his wife [name].  The patient is feeling fine today with no concerning complaints except for shortness of breath with exertion.  He denied having any chest pain, cough or hemoptysis.  He denied having any nausea, vomiting, diarrhea or constipation.  He has no headache or visual changes.  He has no recent weight loss or night sweats.  He completed 1 year treatment with GSheridanrecently.  He had repeat CT scan of the chest as well as MRI of the pelvic area and he is here today for evaluation and discussion of his imaging studies and treatment  options. ? ?MEDICAL HISTORY: ?Past Medical History:  ?Diagnosis Date  ? Anemia   ? Cancer ([facility]   ? bladder cancer  ? COPD (chronic obstructive pulmonary disease) ([facility]   ? mild   ? DIVERTICULOSIS, COLON 01/14/2007  ? Qualifier: Diagnosis of  By: MPaulina FusiRN, JDaine Gravel  ? GASTRITIS, CHRONIC 06/01/2004  ? Qualifier: Diagnosis of  By: SNolon RodCMA (Deborra Medina, Robin    ? GERD 01/09/2007  ? Qualifier: Diagnosis of  By: TScherrie Gerlach   ? Heart murmur   ? as a child; no murmur heard 12/14/19  ? HYPERLIPIDEMIA 01/09/2007  ? Qualifier: Diagnosis of  By: TScherrie Gerlach   ? Hypertension   ? Lung mass   ? left upper nodule  ? Macular degeneration   ? right eye  ? NEOPLASM, MALIGNANT, BLADDER, HX OF 2002  ? Qualifier: Diagnosis of  By: SNolon RodCMA (AAMA), Robin  / no chemo or radiation  ? OSTEOARTHRITIS 01/14/2007  ? Qualifier: Diagnosis of  By: MPaulina FusiRN, JDaine Gravel- knees  ? Rectal mass   ? Reflux esophagitis 06/01/2004  ? Qualifier: Diagnosis of  By: SNolon RodCMA (Deborra Medina, Robin    ? TOBACCO USER 02/16/2009  ? Qualifier: Diagnosis of  By: KBurnice Logan MD, PDoretha Sou  ? VITAMIN D DEFICIENCY 06/03/2007  ? Qualifier: Diagnosis of  By: KBurnice Logan MD, PDoretha Sou  ? ? ?ALLERGIES:  has No Known Allergies. ? ?MEDICATIONS:  ?Current Outpatient Medications  ?Medication Sig  Dispense Refill  ? amLODipine (NORVASC) 5 MG tablet TAKE 1 TABLET (5 MG TOTAL) BY MOUTH DAILY. 90 tablet 1  ? atorvastatin (LIPITOR) 40 MG tablet Take 1/2 tablet (20 mg total) by mouth daily. 90 tablet 1  ? diphenhydrAMINE (BENADRYL) 25 mg capsule Take 1-2 capsules (25-50 mg total) by mouth at bedtime as needed for sleep. 30 capsule 0  ? ibuprofen (ADVIL) 200 MG tablet Take 200 mg by mouth every 6 (six) hours as needed.    ? imatinib (GLEEVEC) 400 MG tablet Take 1 tablet (400 mg total) by mouth daily. Take with meals and large glass of water.Caution:Chemotherapy. 30 tablet 3  ? Multiple Vitamins-Minerals (ICAPS AREDS 2 PO) Take 1 capsule by mouth 2 (two) times daily.    ?  omeprazole (PRILOSEC) 40 MG capsule Take 1 capsule (40 mg total) by mouth daily. 90 capsule 3  ? Vitamin D3 (VITAMIN D) 25 MCG tablet Take 1 tablet by mouth daily.    ? ?No current facility-administered medications for this visit.  ? ? ?SURGICAL HISTORY:  ?Past Surgical History:  ?Procedure Laterality Date  ? BRONCHIAL BIOPSY  10/13/2019  ? Procedure: BRONCHIAL BIOPSIES;  Surgeon: Garner Nash, DO;  Location: [facility];  Service: Pulmonary;;  ? BRONCHIAL BRUSHINGS  10/13/2019  ? Procedure: BRONCHIAL BRUSHINGS;  Surgeon: Garner Nash, DO;  Location: [name];  Service: Pulmonary;;  ? BRONCHIAL NEEDLE ASPIRATION BIOPSY  10/13/2019  ? Procedure: BRONCHIAL NEEDLE ASPIRATION BIOPSIES;  Surgeon: Garner Nash, DO;  Location: [facility];  Service: Pulmonary;;  ? BRONCHIAL WASHINGS  10/13/2019  ? Procedure: BRONCHIAL WASHINGS;  Surgeon: Garner Nash, DO;  Location: [facility];  Service: Pulmonary;;  ? COLONOSCOPY    ? multiple  ? INTERCOSTAL NERVE BLOCK Left 12/16/2019  ? Procedure: INTERCOSTAL NERVE BLOCK;  Surgeon: Lajuana Matte, MD;  Location: [facility];  Service: Thoracic;  Laterality: Left;  ? KNEE SURGERY  07/20/2008  ? bilat.  ? NASAL SEPTUM SURGERY  1995  ? NODE DISSECTION Left 12/16/2019  ? Procedure: NODE DISSECTION;  Surgeon: Lajuana Matte, MD;  Location: [facility];  Service: Thoracic;  Laterality: Left;  ? TONSILLECTOMY    ? TRANSURETHRAL RESECTION OF BLADDER TUMOR  2002  ? UPPER GASTROINTESTINAL ENDOSCOPY    ? VIDEO BRONCHOSCOPY WITH ENDOBRONCHIAL NAVIGATION N/A 10/13/2019  ? Procedure: VIDEO BRONCHOSCOPY WITH ENDOBRONCHIAL NAVIGATION;  Surgeon: Garner Nash, DO;  Location: [facility];  Service: Pulmonary;  Laterality: N/A;  ? WISDOM TOOTH EXTRACTION    ? ? ?REVIEW OF SYSTEMS:  Constitutional: negative ?Eyes: negative ?Ears, nose, mouth, throat, and face: negative ?Respiratory: positive for dyspnea on exertion ?Cardiovascular: negative ?Gastrointestinal:  negative ?Genitourinary:negative ?Integument/breast: negative ?Hematologic/lymphatic: negative ?Musculoskeletal:negative ?Neurological: negative ?Behavioral/Psych: negative ?Endocrine: negative ?Allergic/Immunologic: negative  ? ?PHYSICAL EXAMINATION: General appearance: alert, cooperative, and no distress ?Head: Normocephalic, without obvious abnormality, atraumatic ?Neck: no adenopathy, no JVD, supple, symmetrical, trachea midline, and thyroid not enlarged, symmetric, no tenderness/mass/nodules ?Lymph nodes: Cervical, supraclavicular, and axillary nodes normal. ?Resp: clear to auscultation bilaterally ?Back: symmetric, no curvature. ROM normal. No CVA tenderness. ?Cardio: regular rate and rhythm, S1, S2 normal, no murmur, click, rub or gallop ?GI: soft, non-tender; bowel sounds normal; no masses,  no organomegaly ?Extremities: extremities normal, atraumatic, no cyanosis or edema ?Neurologic: Alert and oriented X 3, normal strength and tone. Normal symmetric reflexes. Normal coordination and gait ? ?ECOG PERFORMANCE STATUS: 1 - Symptomatic but completely ambulatory ? ?Blood pressure 126/76, pulse 63, temperature 98.3 ?F (36.8 ?C), temperature  source Tympanic, resp. rate 17, weight 171 lb 4.8 oz (77.7 kg), SpO2 97 %. ? ?LABORATORY DATA: ?Lab Results  ?Component Value Date  ? WBC 5.2 07/17/2021  ? HGB 12.3 (L) 07/17/2021  ? HCT 36.9 (L) 07/17/2021  ? MCV 100.3 (H) 07/17/2021  ? PLT 151 07/17/2021  ? ? ?  Chemistry   ?   ?Component Value Date/Time  ? NA 140 07/17/2021 0930  ? K 4.0 07/17/2021 0930  ? CL 106 07/17/2021 0930  ? CO2 28 07/17/2021 0930  ? BUN 23 07/17/2021 0930  ? CREATININE 0.84 07/17/2021 0930  ?    ?Component Value Date/Time  ? CALCIUM 9.2 07/17/2021 0930  ? ALKPHOS 80 07/17/2021 0930  ? AST 28 07/17/2021 0930  ? ALT 19 07/17/2021 0930  ? BILITOT 0.8 07/17/2021 0930  ?  ? ? ? ?RADIOGRAPHIC STUDIES: ?CT Chest W Contrast ? ?Result Date: 07/17/2021 ?CLINICAL DATA:  71 year old male presents for non-small  cell lung cancer staging. * Tracking Code: BO * EXAM: CT CHEST WITH CONTRAST TECHNIQUE: Multidetector CT imaging of the chest was performed during intravenous contrast administration. RADIATION DOSE REDUCTION: This exam was performed a

## 2021-07-25 ENCOUNTER — Other Ambulatory Visit (HOSPITAL_COMMUNITY): Payer: Self-pay

## 2021-08-11 ENCOUNTER — Other Ambulatory Visit (HOSPITAL_COMMUNITY): Payer: Self-pay

## 2021-08-14 ENCOUNTER — Other Ambulatory Visit (HOSPITAL_COMMUNITY): Payer: Self-pay

## 2021-08-16 ENCOUNTER — Other Ambulatory Visit (HOSPITAL_COMMUNITY): Payer: Self-pay

## 2021-08-21 ENCOUNTER — Ambulatory Visit: Payer: Self-pay | Admitting: Surgery

## 2021-08-21 DIAGNOSIS — R739 Hyperglycemia, unspecified: Secondary | ICD-10-CM

## 2021-08-21 DIAGNOSIS — C49A5 Gastrointestinal stromal tumor of rectum: Principal | ICD-10-CM | POA: Diagnosis present

## 2021-08-21 DIAGNOSIS — C3492 Malignant neoplasm of unspecified part of left bronchus or lung: Secondary | ICD-10-CM | POA: Diagnosis not present

## 2021-08-24 ENCOUNTER — Other Ambulatory Visit: Payer: Self-pay | Admitting: Urology

## 2021-09-04 ENCOUNTER — Other Ambulatory Visit (HOSPITAL_COMMUNITY): Payer: Self-pay

## 2021-09-21 ENCOUNTER — Other Ambulatory Visit (HOSPITAL_COMMUNITY): Payer: Self-pay

## 2021-09-26 ENCOUNTER — Other Ambulatory Visit: Payer: Self-pay | Admitting: Urology

## 2021-09-26 NOTE — Progress Notes (Signed)
Anesthesia Review:  PCP: Cardiologist : Chest x-ray : Ct Chest- 07/17/21  EKG : Echo : Stress test: Cardiac Cath :  Activity level:  Sleep Study/ CPAP : Fasting Blood Sugar :      / Checks Blood Sugar -- times a day:   Blood Thinner/ Instructions /Last Dose: ASA / Instructions/ Last Dose :

## 2021-09-27 NOTE — Progress Notes (Signed)
DUE TO COVID-19 ONLY  2  VISITOR IS ALLOWED TO COME WITH YOU AND STAY IN THE WAITING ROOM ONLY DURING PRE OP AND PROCEDURE DAY OF SURGERY.   4 VISITOR  MAY VISIT WITH YOU AFTER SURGERY IN YOUR PRIVATE ROOM DURING VISITING HOURS ONLY! YOU MAY HAVE ONE PERSON SPEND THE NITE WITH YOU IN YOUR ROOM AFTER SURGERY.    ING.    Your procedure is scheduled on:           10/12/21   Report to [facility] Main  Entrance   Report to admitting at     0515             AM DO NOT [place], PICTURE ID OR WALLET DAY OF SURGERY.      Call this number if you have problems the morning of surgery [phone]  Follow bowel prep instructions per MD  Clear liquid diet on day of bowel prep.    REMEMBER: NO  SOLID FOODS , CANDY, GUM OR MINTS AFTER MIDNITE THE NITE BEFORE SURGERY .       Marland Kitchen CLEAR LIQUIDS UNTIL      0430am           DAY OF SURGERY.      PLEASE FINISH ENSURE DRINK PER SURGEON ORDER  WHICH NEEDS TO BE COMPLETED AT       0430am     MORNING OF SURGERY.       CLEAR LIQUID DIET   Foods Allowed      WATER BLACK COFFEE ( SUGAR OK, NO MILK, CREAM OR CREAMER) REGULAR AND DECAF  TEA ( SUGAR OK NO MILK, CREAM, OR CREAMER) REGULAR AND DECAF  PLAIN JELLO ( NO RED)  FRUIT ICES ( NO RED, NO FRUIT PULP)  POPSICLES ( NO RED)  JUICE- APPLE, WHITE GRAPE AND WHITE CRANBERRY  SPORT DRINK LIKE GATORADE ( NO RED)  CLEAR BROTH ( VEGETABLE , CHICKEN OR BEEF)                                                                     BRUSH YOUR TEETH MORNING OF SURGERY AND RINSE YOUR MOUTH OUT, NO CHEWING GUM CANDY OR MINTS.     Take these medicines the morning of surgery with A SIP OF WATER:  amlodipione, omeprazole    DO NOT TAKE ANY DIABETIC MEDICATIONS DAY OF YOUR SURGERY                               You may not have any metal on your body including hair pins and              piercings  Do not wear jewelry, make-up, lotions, powders or perfumes, deodorant             Do not wear nail polish  on your fingernails.              IF YOU ARE A MALE AND WANT TO SHAVE UNDER ARMS OR LEGS PRIOR TO SURGERY YOU MUST DO SO AT LEAST 48 HOURS PRIOR TO SURGERY.              Men may shave face  and neck.   Do not bring valuables to the hospital. [place].  Contacts, dentures or bridgework may not be worn into surgery.  Leave suitcase in the car. After surgery it may be brought to your room.     Patients discharged the day of surgery will not be allowed to drive home. IF YOU ARE HAVING SURGERY AND GOING HOME THE SAME DAY, YOU MUST HAVE AN ADULT TO DRIVE YOU HOME AND BE WITH YOU FOR 24 HOURS. YOU MAY GO HOME BY TAXI OR UBER OR ORTHERWISE, BUT AN ADULT MUST ACCOMPANY YOU HOME AND STAY WITH YOU FOR 24 HOURS.                Please read over the following fact sheets you were given: _____________________________________________________________________  [facility] - Preparing for Surgery Before surgery, you can play an important role.  Because skin is not sterile, your skin needs to be as free of germs as possible.  You can reduce the number of germs on your skin by washing with CHG (chlorahexidine gluconate) soap before surgery.  CHG is an antiseptic cleaner which kills germs and bonds with the skin to continue killing germs even after washing. Please DO NOT use if you have an allergy to CHG or antibacterial soaps.  If your skin becomes reddened/irritated stop using the CHG and inform your nurse when you arrive at Short Stay. Do not shave (including legs and underarms) for at least 48 hours prior to the first CHG shower.  You may shave your face/neck. Please follow these instructions carefully:  1.  Shower with CHG Soap the night before surgery and the  morning of Surgery.  2.  If you choose to wash your hair, wash your hair first as usual with your  normal  shampoo.  3.  After you shampoo, rinse your hair and body thoroughly to remove the  shampoo.                            4.  Use CHG as you would any other liquid soap.  You can apply chg directly  to the skin and wash                       Gently with a scrungie or clean washcloth.  5.  Apply the CHG Soap to your body ONLY FROM THE NECK DOWN.   Do not use on face/ open                           Wound or open sores. Avoid contact with eyes, ears mouth and genitals (private parts).                       Wash face,  Genitals (private parts) with your normal soap.             6.  Wash thoroughly, paying special attention to the area where your surgery  will be performed.  7.  Thoroughly rinse your body with warm water from the neck down.  8.  DO NOT shower/wash with your normal soap after using and rinsing off  the CHG Soap.                9.  Pat yourself dry  with a clean towel.            10.  Wear clean pajamas.            11.  Place clean sheets on your bed the night of your first shower and do not  sleep with pets. Day of Surgery : Do not apply any lotions/deodorants the morning of surgery.  Please wear clean clothes to the hospital/surgery center.  FAILURE TO FOLLOW THESE INSTRUCTIONS MAY RESULT IN THE CANCELLATION OF YOUR SURGERY PATIENT SIGNATURE_________________________________  NURSE SIGNATURE__________________________________  ________________________________________________________________________

## 2021-09-28 ENCOUNTER — Encounter (HOSPITAL_COMMUNITY)
Admission: RE | Admit: 2021-09-28 | Discharge: 2021-09-28 | Disposition: A | Discharge status: Home / Self Care | Payer: Medicare Other | Source: Ambulatory Visit | Attending: Family Medicine | Admitting: Family Medicine

## 2021-09-28 ENCOUNTER — Encounter (HOSPITAL_COMMUNITY): Payer: Self-pay

## 2021-09-28 ENCOUNTER — Other Ambulatory Visit (HOSPITAL_COMMUNITY): Payer: Self-pay

## 2021-09-29 ENCOUNTER — Other Ambulatory Visit (HOSPITAL_COMMUNITY): Payer: Self-pay

## 2021-10-02 ENCOUNTER — Other Ambulatory Visit (HOSPITAL_COMMUNITY): Payer: Self-pay

## 2021-10-18 ENCOUNTER — Other Ambulatory Visit: Payer: Self-pay | Admitting: Internal Medicine

## 2021-10-18 ENCOUNTER — Telehealth: Payer: Self-pay

## 2021-10-18 ENCOUNTER — Other Ambulatory Visit (HOSPITAL_COMMUNITY): Payer: Self-pay

## 2021-10-18 DIAGNOSIS — C49A5 Gastrointestinal stromal tumor of rectum: Secondary | ICD-10-CM

## 2021-10-18 MED ORDER — IMATINIB MESYLATE 400 MG PO TABS
400.0000 mg | ORAL_TABLET | Freq: Every day | ORAL | 3 refills | Status: DC
  Filled 2021-10-18: qty 30, 30d supply, fill #0

## 2021-10-18 NOTE — Telephone Encounter (Signed)
T/C from pt stating he was told he would need to be on [place] for an additional 3 years.  He is requesting a call [phone] to discuss an alternative medication. His next f/up appt is not until 01/22/22.  Please advise.

## 2021-10-24 ENCOUNTER — Telehealth: Payer: Self-pay | Admitting: Pharmacist

## 2021-10-24 NOTE — Chronic Care Management (AMB) (Signed)
    Chronic Care Management Pharmacy Assistant   Name: SATHVIK TIEDT  MRN: [id number] DOB: [birth year]  10/25/2021 APPOINTMENT REMINDER  Called Nikki Dom [facility]st, No answer, left message of appointment on 10/25/2021 at 8:30 via office visit with Jeni Salles, Pharm D. Notified to have all medications, supplements, blood pressure and/or blood sugar logs available during appointment and to return call if need to reschedule.  Care Gaps: AWV - completed 07/18/2021 Last BP - 126/76 on 07/20/2021  Star Rating Drug: Atorvastatin 40mg  - last filled 08/25/2021 90 DS at [facility]  Any gaps in medications fill history? No  Gennie Alma [facility]  Catering manager [phone]

## 2021-10-25 ENCOUNTER — Encounter: Payer: Self-pay | Admitting: Family Medicine

## 2021-10-25 ENCOUNTER — Ambulatory Visit: Payer: Medicare Other | Admitting: Pharmacist

## 2021-10-25 ENCOUNTER — Ambulatory Visit (INDEPENDENT_AMBULATORY_CARE_PROVIDER_SITE_OTHER): Payer: Medicare Other | Admitting: Family Medicine

## 2021-10-25 VITALS — BP 136/72 | HR 68 | Temp 98.0°F | Ht 69.0 in | Wt 171.4 lb

## 2021-10-25 DIAGNOSIS — I700 Atherosclerosis of aorta: Secondary | ICD-10-CM

## 2021-10-25 DIAGNOSIS — Z0000 Encounter for general adult medical examination without abnormal findings: Secondary | ICD-10-CM

## 2021-10-25 DIAGNOSIS — Z125 Encounter for screening for malignant neoplasm of prostate: Secondary | ICD-10-CM

## 2021-10-25 DIAGNOSIS — C49A5 Gastrointestinal stromal tumor of rectum: Secondary | ICD-10-CM

## 2021-10-25 DIAGNOSIS — E782 Mixed hyperlipidemia: Secondary | ICD-10-CM

## 2021-10-25 DIAGNOSIS — E559 Vitamin D deficiency, unspecified: Secondary | ICD-10-CM

## 2021-10-25 DIAGNOSIS — I10 Essential (primary) hypertension: Secondary | ICD-10-CM

## 2021-10-25 DIAGNOSIS — K219 Gastro-esophageal reflux disease without esophagitis: Secondary | ICD-10-CM

## 2021-10-25 LAB — CBC WITH DIFFERENTIAL/PLATELET
Basophils Absolute: 0 10*3/uL (ref 0.0–0.1)
Basophils Relative: 0.5 % (ref 0.0–3.0)
Eosinophils Absolute: 0.1 10*3/uL (ref 0.0–0.7)
Eosinophils Relative: 3.2 % (ref 0.0–5.0)
HCT: 33.6 % — ABNORMAL LOW (ref 39.0–52.0)
Hemoglobin: 11.6 g/dL — ABNORMAL LOW (ref 13.0–17.0)
Lymphocytes Relative: 21.4 % (ref 12.0–46.0)
Lymphs Abs: 1 10*3/uL (ref 0.7–4.0)
MCHC: 34.4 g/dL (ref 30.0–36.0)
MCV: 101.3 fl — ABNORMAL HIGH (ref 78.0–100.0)
Monocytes Absolute: 0.3 10*3/uL (ref 0.1–1.0)
Monocytes Relative: 5.8 % (ref 3.0–12.0)
Neutro Abs: 3.1 10*3/uL (ref 1.4–7.7)
Neutrophils Relative %: 69.1 % (ref 43.0–77.0)
Platelets: 133 10*3/uL — ABNORMAL LOW (ref 150.0–400.0)
RBC: 3.32 Mil/uL — ABNORMAL LOW (ref 4.22–5.81)
RDW: 14.2 % (ref 11.5–15.5)
WBC: 4.5 10*3/uL (ref 4.0–10.5)

## 2021-10-25 LAB — COMPREHENSIVE METABOLIC PANEL
ALT: 18 U/L (ref 0–53)
AST: 27 U/L (ref 0–37)
Albumin: 4.3 g/dL (ref 3.5–5.2)
Alkaline Phosphatase: 86 U/L (ref 39–117)
BUN: 23 mg/dL (ref 6–23)
CO2: 29 mEq/L (ref 19–32)
Calcium: 9.1 mg/dL (ref 8.4–10.5)
Chloride: 107 mEq/L (ref 96–112)
Creatinine, Ser: 0.84 mg/dL (ref 0.40–1.50)
GFR: 87.28 mL/min (ref >60.00)
Glucose, Bld: 103 mg/dL — ABNORMAL HIGH (ref 70–99)
Potassium: 4 mEq/L (ref 3.5–5.1)
Sodium: 142 mEq/L (ref 135–145)
Total Bilirubin: 0.5 mg/dL (ref 0.2–1.2)
Total Protein: 6.3 g/dL (ref 6.0–8.3)

## 2021-10-25 LAB — LIPID PANEL
Cholesterol: 138 mg/dL (ref 0–200)
HDL: 50.9 mg/dL (ref >39.00)
LDL Cholesterol: 72 mg/dL (ref 0–99)
NonHDL: 86.97
Total CHOL/HDL Ratio: 3
Triglycerides: 74 mg/dL (ref 0.0–149.0)
VLDL: 14.8 mg/dL (ref 0.0–40.0)

## 2021-10-25 LAB — T4, FREE: Free T4: 0.8 ng/dL (ref 0.60–1.60)

## 2021-10-25 LAB — HEMOGLOBIN A1C: Hgb A1c MFr Bld: 5.4 % (ref 4.6–6.5)

## 2021-10-25 LAB — TSH: TSH: 1.02 u[IU]/mL (ref 0.35–5.50)

## 2021-10-25 LAB — PSA: PSA: 3.53 ng/mL (ref 0.10–4.00)

## 2021-10-25 LAB — VITAMIN D 25 HYDROXY (VIT D DEFICIENCY, FRACTURES): VITD: 32.98 ng/mL (ref 30.00–100.00)

## 2021-10-25 MED ORDER — ATORVASTATIN CALCIUM 40 MG PO TABS: 20.0000 mg | ORAL_TABLET | Freq: Every day | ORAL | 3 refills | Status: DC

## 2021-10-25 MED ORDER — OMEPRAZOLE 40 MG PO CPDR: 40.0000 mg | DELAYED_RELEASE_CAPSULE | Freq: Every day | ORAL | 3 refills | Status: DC

## 2021-10-25 MED ORDER — AMLODIPINE BESYLATE 5 MG PO TABS: 5.0000 mg | ORAL_TABLET | Freq: Every day | ORAL | 3 refills | Status: DC

## 2021-10-25 NOTE — Patient Instructions (Signed)
Hi Jim,  It was great to see you again! Go ahead and try your best to cut back on the Benadryl if you can help it. It should really only be used as needed and not consistently. You can try up to 10 mg of melatonin if needed.  Please reach out to me if you have any questions or need anything before our follow up!  Best, Maddie  Jeni Salles, PharmD, [place] Pharmacist [facility]  Visit Information   Goals Addressed   None    Patient Care Plan: CCM Pharmacy Care Plan     Problem Identified: Problem: Hypertension, Hyperlipidemia, Coronary Artery Disease, GERD, and Osteoarthritis      Long-Range Goal: Patient-Specific Goal   Start Date: 03/06/2021  Expected End Date: 03/06/2022  Recent Progress: On track  Priority: High  Note:   Current Barriers:  Unable to independently monitor therapeutic efficacy Unable to achieve control of cholesterol   Pharmacist Clinical Goal(s):  Patient will achieve adherence to monitoring guidelines and medication adherence to achieve therapeutic efficacy achieve control of cholesterol as evidenced by next lipid panel  through collaboration with PharmD and provider.   Interventions: 1:1 collaboration with [name], MD regarding development and update of comprehensive plan of care as evidenced by provider attestation and co-signature Inter-disciplinary care team collaboration (see longitudinal plan of care) Comprehensive medication review performed; medication list updated in electronic medical record  Hypertension (BP goal <140/90) -Controlled -Current treatment: Amlodipine 5 mg 1 tablet daily - Appropriate, Effective, Safe, Accessible -Medications previously tried: n/a  -Current home readings: usually 120s/70s; (checking every day with wrist cuff) -Current dietary habits: doesn't eat whole lot of salt; does look at package labels - eating a lot of fresh foods; eating out very little (twice a  month later) -Current exercise habits: walki -Denies hypotensive/hypertensive symptoms -Educated on BP goals and benefits of medications for prevention of heart attack, stroke and kidney damage; Importance of home blood pressure monitoring; Proper BP monitoring technique; -Counseled to monitor BP at home at least weekly, document, and provide log at future appointments -Counseled on diet and exercise extensively Recommended to continue current medication Cautioned against consistent use of Advil as this can raise his blood pressure.  Hyperlipidemia: (LDL goal < 70) -Uncontrolled -Current treatment: Atorvastatin 40 mg 1/2 tablet daily - Appropriate, Query effective, Safe, Accessible -Medications previously tried: none  -Current dietary patterns: tries to eat healthy -Current exercise habits: walking daily -Educated on Cholesterol goals;  Benefits of statin for ASCVD risk reduction; -Counseled on diet and exercise extensively Recommended to continue current medication Recommended repeat lipid panel and consider increasing to 40 mg daily if LDL is above goal < 70.  GERD (Goal: minimize symptoms) -Controlled -Current treatment  Omeprazole 40 mg 1 capsule daily - Appropriate, Effective, Safe, Accessible -Medications previously tried: none  -Recommended to continue current medication Counseled on foods to limit to avoid symptoms of heartburn or acid reflux.  Rectal cancer (Goal: prevent progression of cancer) -Controlled -Current treatment  Imatinib 400 mg 1 tablet daily - Appropriate, Effective, Safe, Accessible -Medications previously tried: n/a  -Recommended to continue current medication  Vitamin D deficiency (Goal: vitamin D 30-100) -Uncontrolled -Current treatment  Vitamin D 2000 units daily - Appropriate, Query effective, Safe, Accessible -Medications previously tried: none  -Recommended repeat level.  Health Maintenance -Vaccine gaps: none -Current therapy:   Diphenhydramine 25 mg - taking one a day at night - taking for about a year  CBD gummies -  not often -Educated on Cost vs benefit of each product must be carefully weighed by individual consumer -Patient is satisfied with current therapy and denies issues -Recommended limiting use of Benadryl due to risk of falls. Recommended timed release melatonin.  Patient Goals/Self-Care Activities Patient will:  - take medications as prescribed as evidenced by patient report and record review check blood pressure at least weekly, document, and provide at future appointments target a minimum of 150 minutes of moderate intensity exercise weekly  Follow Up Plan: The care management team will reach out to the patient again over the next 90 days.        Patient verbalizes understanding of instructions and care plan provided today and agrees to view in [place]. Active MyChart status and patient understanding of how to access instructions and care plan via MyChart confirmed with patient.    The pharmacy team will reach out to the patient again over the next 90 days.   Viona Gilmore, [facility]

## 2021-10-25 NOTE — Progress Notes (Signed)
Subjective:     Jesse [name] is a 71 y.o. male and is here for a comprehensive physical exam. The patient reports doing well all things considered.  While patient and his wife were out of the country and his son died unexpectedly.  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  Also has appointment with oncology to discuss likely continuation of Gleevec several years after removal of tumor.  Patient states for 2 hours after taking Gleevec he is unable to think has to just sit around until the sensation passes.  Also endorses loose stools.  Pt was recently able to swim down and back in his pool on 1 breath.  Has not been able/tried to do that since have lobectomy for adenocarcinoma of L lung.  Patient states blood pressure has been controlled.  Resting refill on Norvasc 5 mg, Prilosec, Lipitor.  Taking half tab of Lipitor 40 mg.   With clinic pharmacist later today.  Pt was unable to plant his garden this Spring.  Has some small tomato plants.  Social History   Socioeconomic History   Marital status: Married    Spouse name: Not on file   Number of children: 3   Years of education: Not on file   Highest education level: Not on file  Occupational History    Comment: semi retired   Tobacco Use   Smoking status: Former    Packs/day: 0.50    Years: 40.00    Total pack years: 20.00    Types: Cigarettes    Quit date: 09/21/2019    Years since quitting: 2.0   Smokeless tobacco: Never  Vaping Use   Vaping Use: Never used  Substance and Sexual Activity   Alcohol use: Yes    Alcohol/week: 15.0 standard drinks of alcohol    Types: 15 Standard drinks or equivalent per week    Comment: wine/beer/martini   Drug use: Yes    Types: Marijuana    Comment: Smokes marijuana occasional, last use in 08/2019   Sexual activity: Not on file  Other Topics Concern   Not on file  Social History Narrative   Married   3 children: 3 grandchildren; 3 great grandchildren   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   Enjoys yard work and [place] Strain: [place]  (07/18/2021)   Overall Financial Resource Strain (CARDIA)    Difficulty of Paying Living Expenses: Not hard at [place]: No [place] (07/18/2021)   Hunger Vital Sign    Worried About Running Out of Food in the Last Year: Never true    [place] in the Last Year: Never true  Transportation Needs: No Transportation Needs (07/18/2021)   PRAPARE - Hydrologist (Medical): No    Lack of Transportation (Non-Medical): No  Physical Activity: Sufficiently Active (07/18/2021)   Exercise Vital Sign    Days of Exercise per Week: 7 days    Minutes of Exercise per Session: 60 min  Stress: No Stress Concern Present (07/18/2021)   [place]    Feeling of Stress : Not at all  Social Connections: Moderately Isolated (07/12/2020)   Social Connection and Isolation Panel [NHANES]    Frequency of Communication with Friends and Family: More than three times a week    Frequency of Social Gatherings with Friends and Family: More  than three times a week    Attends Religious Services: Never    Active Member of Clubs or Organizations: No    Attends Archivist Meetings: Never    Marital Status: Married  Human resources officer Violence: Not At Risk (07/12/2020)   Humiliation, Afraid, Rape, and Kick questionnaire    Fear of Current or Ex-Partner: No    Emotionally Abused: No    Physically Abused: No    Sexually Abused: No   Health Maintenance  Topic Date Due   INFLUENZA VACCINE  11/14/2021   TETANUS/TDAP  10/08/2022   Pneumonia Vaccine 67+ Years old  Completed   COVID-19 Vaccine  Completed   Hepatitis C Screening  Completed   Zoster Vaccines- Shingrix  Completed   HPV VACCINES  Aged Out    The following portions of the patient's history were reviewed and updated as appropriate:  allergies, current medications, past family history, past medical history, past social history, past surgical history, and problem list.  Review of Systems Pertinent items noted in HPI and remainder of comprehensive ROS otherwise negative.   Objective:    BP 136/72 (BP Location: Right Arm, Patient Position: Sitting, Cuff Size: Normal)   Pulse 68   Temp 98 F (36.7 C) (Oral)   Ht 5\' 9"  (1.753 m)   Wt 171 lb 6.4 oz (77.7 kg)   SpO2 99%   BMI 25.31 kg/m  General appearance: alert, cooperative, and no distress Head: Normocephalic, without obvious abnormality, atraumatic Eyes: conjunctivae/corneas clear. PERRL, EOM's intact. Fundi benign. Ears: normal TM's and external ear canals both ears Nose: Nares normal. Septum midline. Mucosa normal. No drainage or sinus tenderness. Throat: lips, mucosa, and tongue normal; teeth and gums normal Neck: no adenopathy, no carotid bruit, no JVD, supple, symmetrical, trachea midline, and thyroid not enlarged, symmetric, no tenderness/mass/nodules Lungs: clear to auscultation bilaterally Heart: regular rate and rhythm, S1, S2 normal, no murmur, click, rub or gallop Abdomen: soft, non-tender; bowel sounds normal; no masses,  no organomegaly Extremities: extremities normal, atraumatic, no cyanosis or edema Pulses: 2+ and symmetric Skin: Skin color, texture, turgor normal. No rashes or lesions Lymph nodes: Cervical, supraclavicular, and axillary nodes normal. Neurologic: Alert and oriented X 3, normal strength and tone. Normal symmetric reflexes. Normal coordination and gait    Assessment:    Healthy male exam with h/o adenocarcinoma left lung s/p lobectomy, HTN, GIST with upcoming resection, history of bladder cancer   Plan:    Anticipatory guidance given including wearing seatbelts, smoke detectors in the home, increasing physical activity, increasing p.o. intake of water and vegetables. 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[place])  -Robotic assisted lower anterior recto sigmoid resection planned for 12/14/2021 -Continue follow-up with oncology, general surgery - Plan: CBC with Differential/Platelet, TSH, T4, Free, Hemoglobin A1c, CMP  Essential (primary) hypertension -Controlled -Continue current medications including Norvasc 5 mg daily -Continue lifestyle modifications  - Plan: amLODipine (NORVASC) 5 MG tablet, TSH, T4, Free  Gastroesophageal reflux disease, unspecified whether esophagitis present -Avoid foods known to cause symptoms  - Plan: omeprazole (PRILOSEC) 40 MG capsule  Aortic atherosclerosis (HCC) -Triglycerides 211, HDL 42.5, total cholesterol 191 on 10/22/2018 -Continue lifestyle modifications -Continue Lipitor 40 mg half tab daily  - Plan: atorvastatin (LIPITOR) 40 MG tablet, Lipid panel  Screening for prostate cancer  - Plan: PSA  Vitamin D deficiency  -  Plan: Vitamin D, 25-hydroxy  F/u prn  Grier Mitts, MD

## 2021-10-25 NOTE — Progress Notes (Signed)
Chronic Care Management Pharmacy Note  10/25/2021 Name:  Jesse [name] MRN:  [id number] DOB:  05/17/1950  Summary: BP is at goal < 140/90 LDL is not at goal < 70  Recommendations/Changes made from today's visit: -Recommended bringing BP cuff to next office visit to ensure accuracy -Recommend repeat lipid panel and vitamin D level -Recommended limiting Benadryl use -Updated medication list for vitamin D dose as pt takes 2000 units/day  Plan: Follow up BP assessment in 6 months  Subjective: Jesse [name] is an 71 y.o. year old male who is a primary patient of [name], MD.  The CCM team was consulted for assistance with disease management and care coordination needs.    Engaged with patient face to face for follow up visit in response to provider referral for pharmacy case management and/or care coordination services.   Consent to Services:  The patient was given information about Chronic Care Management services, agreed to services, and gave verbal consent prior to initiation of services.  Please see initial visit note for detailed documentation.   Patient Care Team: [name], MD as PCP - General (Family Medicine) Curt Bears, MD as Consulting Physician (Oncology) Viona Gilmore, [facility] as Pharmacist (Pharmacist) Michael Boston, MD as Consulting Physician (General Surgery) Gatha Mayer, MD as Consulting Physician (Gastroenterology) Garner Nash, DO as Consulting Physician (Pulmonary Disease) Alexis Frock, MD as Consulting Physician (Urology)  Recent office visits: 07/18/21 Glenna Durand, LPN: Patient presented for AWV.  05/04/2021 Grier Mitts MD - Patient was seen for travel advice and additional issues. Started Malarone 250/100 mg. Take one tablet by mouth daily starting two days prior to your trip, continue one tablet daily during trip and for seven days after returning from trip. No follow up noted.    Recent consult visits: 08/21/21 Michael Boston, MD (surgery): Patient presented for GI stromal tumor follow up.   07/20/21 Curt Bears, MD (oncology): Patient presented for lung cancer follow up. Recommended seeing Dr. 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  06/07/2021 Curt Bears MD (oncology) - Patient was seem for Malignant neoplasm of unspecified part of unspecified bronchus or lung and additional issues. No medication changes. Follow up in 6 weeks.   Hospital visits: None in previous 6 months   Objective:  Lab Results  Component Value Date   CREATININE 0.84 07/17/2021   BUN 23 07/17/2021   GFR 96.91 10/08/2019   GFRNONAA >60 07/17/2021   GFRAA >60 12/31/2019   NA 140 07/17/2021   K 4.0 07/17/2021   CALCIUM 9.2 07/17/2021   CO2 28 07/17/2021   GLUCOSE 113 (H) 07/17/2021    Lab Results  Component Value Date/Time   HGBA1C 5.8 10/22/2018 08:38 AM   GFR 96.91 10/08/2019 10:07 AM   GFR 108.16 10/22/2018 08:38 AM    Last diabetic Eye exam: No results found for: "HMDIABEYEEXA"  Last diabetic Foot exam: No results found for: "HMDIABFOOTEX"   Lab Results  Component Value Date   CHOL 191 10/22/2018   HDL 42.50 10/22/2018   LDLDIRECT 102.0 10/22/2018   TRIG 211.0 (H) 10/22/2018   CHOLHDL 4 10/22/2018       Latest Ref Rng & Units 07/17/2021    9:30 AM 06/07/2021    9:43 AM 03/24/2021    7:43 AM  Hepatic Function  Total Protein 6.5 - 8.1 g/dL 6.9  6.2  6.6   Albumin 3.5 - 5.0 g/dL 4.3  4.0  4.1   AST 15 - 41 U/L '28  26  22   ' ALT 0 - 44 U/L '19  20  17   ' Alk Phosphatase 38 - 126 U/L 80  87  78   Total Bilirubin 0.3 - 1.2 mg/dL 0.8  0.5  0.7     Lab Results  Component Value Date/Time   TSH 1.10 10/24/2020 08:52 AM   TSH 1.23 10/08/2017 10:16 AM   FREET4 0.82 10/24/2020 08:52 AM       Latest Ref Rng & Units 07/17/2021    9:30 AM 06/07/2021    9:43 AM 03/24/2021    7:43 AM  CBC  WBC 4.0 - 10.5 K/uL 5.2  4.8  4.2   Hemoglobin 13.0 - 17.0 g/dL 12.3  11.1  11.8    Hematocrit 39.0 - 52.0 % 36.9  32.4  34.8   Platelets 150 - 400 K/uL 151  126  126     Lab Results  Component Value Date/Time   VD25OH 20.27 (L) 10/24/2020 08:52 AM    Clinical ASCVD: Yes  The ASCVD Risk score (Arnett DK, et al., 2019) failed to calculate for the following reasons:   Cannot find a previous HDL lab   Cannot find a previous total cholesterol lab       07/18/2021    9:16 AM 07/12/2020    8:56 AM 06/03/2019    8:31 AM  Depression screen PHQ 2/9  Decreased Interest 0 0 0  Down, Depressed, Hopeless 0 0 0  PHQ - 2 Score 0 0 0      Social History   Tobacco Use  Smoking Status Former   Packs/day: 0.50   Years: 40.00   Total pack years: 20.00   Types: Cigarettes   Quit date: 09/21/2019   Years since quitting: 2.0  Smokeless Tobacco Never   BP Readings from Last 3 Encounters:  10/25/21 136/72  07/20/21 126/76  06/07/21 132/67   Pulse Readings from Last 3 Encounters:  10/25/21 68  07/20/21 63  06/07/21 83   Wt Readings from Last 3 Encounters:  10/25/21 171 lb 6.4 oz (77.7 kg)  07/20/21 171 lb 4.8 oz (77.7 kg)  07/18/21 170 lb (77.1 kg)   BMI Readings from Last 3 Encounters:  10/25/21 25.31 kg/m  07/20/21 25.30 kg/m  07/18/21 25.10 kg/m    Assessment/Interventions: Review of patient past medical history, allergies, medications, health status, including review of consultants reports, laboratory and other test data, was performed as part of comprehensive evaluation and provision of chronic care management services.   SDOH:  (Social Determinants of Health) assessments and interventions performed: Yes   SDOH Screenings   Alcohol Screen: Not on file  Depression (PHQ2-9): Low Risk  (07/18/2021)   Depression (PHQ2-9)    PHQ-2 Score: 0  Financial Resource Strain: Low Risk  (07/18/2021)   Overall Financial Resource Strain (CARDIA)    Difficulty of Paying Living Expenses: Not hard at all  Food Insecurity: No Food Insecurity (07/18/2021)   Hunger Vital Sign     Worried About Running Out of Food in the Last Year: Never true    Ran Out of Food in the Last Year: Never true  Housing: Medium Risk (07/12/2020)   Housing    Last Housing Risk Score: 1  Physical Activity: Sufficiently Active (07/18/2021)   Exercise Vital Sign    Days of Exercise per Week: 7 days    Minutes of Exercise per Session: 60 min  Social Connections: Moderately Isolated (  07/12/2020)   Social Connection and Isolation Panel [NHANES]    Frequency of Communication with Friends and Family: More than three times a week    Frequency of Social Gatherings with Friends and Family: More than three times a week    Attends Religious Services: Never    Marine scientist or Organizations: No    Attends Archivist Meetings: Never    Marital Status: Married  Stress: No Stress Concern Present (07/18/2021)   [place]    Feeling of Stress : Not at all  Tobacco Use: Medium Risk (10/25/2021)   Patient History    Smoking Tobacco Use: Former    Smokeless Tobacco Use: Never    Passive Exposure: Not on file  Transportation Needs: No Transportation Needs (07/18/2021)   PRAPARE - Hydrologist (Medical): No    Lack of Transportation (Non-Medical): No   CCM Care Plan  No Known Allergies  Medications Reviewed Today     Reviewed by Viona Gilmore, [facility] (Pharmacist) on 10/25/21 at [phone]  Med List Status: <None>   Medication Order Taking? Sig Documenting Provider Last Dose Status Informant  amLODipine (NORVASC) 5 MG tablet [id number]  Take 1 tablet (5 mg total) by mouth daily. [name], MD  Active   atorvastatin (LIPITOR) 40 MG tablet [id number]  Take 1/2 tablet (20 mg total) by mouth daily. [name], MD  Active   Cholecalciferol (VITAMIN D3 PO) [id number]  Take 1 capsule by mouth daily. [provider]  Active Self  diphenhydrAMINE (BENADRYL) 25 mg capsule [id number] No  Take 1-2 capsules (25-50 mg total) by mouth at bedtime as needed for sleep.  Patient not taking: Reported on 10/25/2021   Elgie Collard, PA-C Not Taking Active Self  ibuprofen (ADVIL) 200 MG tablet [id number]  Take 600 mg by mouth every 6 (six) hours as needed for moderate pain. [provider]  Active Self  imatinib (GLEEVEC) 400 MG tablet [id number]  Take 400 mg by mouth daily. Take with meals and large glass of water.Caution:Chemotherapy. [provider]  Active   Multiple Vitamins-Minerals (PRESERVISION AREDS 2) CAPS [id number]  Take 1 capsule by mouth 2 (two) times daily. [provider]  Active Self  omeprazole (PRILOSEC) 40 MG capsule [id number]  Take 1 capsule (40 mg total) by mouth daily. [name], MD  Active   VITAMIN D PO [id number]  Take by mouth. 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   Encounter for antineoplastic chemotherapy 01/28/2020   Adenocarcinoma of left lung, stage 2 ([place]) 12/31/2019   Goals of care, counseling/discussion 12/31/2019   S/P lobectomy of lung 12/16/2019   Lung nodule 10/13/2019   Essential hypertension 09/28/2016   Coronary artery calcification 09/28/2016   Impaired glucose tolerance 09/28/2016   NEOPLASM, MALIGNANT, BLADDER, HX OF 07/18/2007   Vitamin D deficiency 06/03/2007   DIVERTICULOSIS, COLON 01/14/2007   Osteoarthritis 01/14/2007   Dyslipidemia 01/09/2007   GERD 01/09/2007   REFLUX ESOPHAGITIS 06/01/2004   GASTRITIS, CHRONIC 06/01/2004    Immunization History  Administered Date(s) Administered   Fluad Quad(high Dose 65+) 01/16/2019, 02/25/2020   PFIZER Comirnaty(Gray Top)Covid-19 Tri-Sucrose Vaccine 11/09/2020   PFIZER(Purple Top)SARS-COV-2 Vaccination  06/07/2019, 06/30/2019, 03/17/2020   Pfizer  Covid-19 Vaccine Bivalent Booster 38yr & up 02/01/2021   Pneumococcal Conjugate-13 06/08/2015   Pneumococcal Polysaccharide-23 09/28/2016   Tdap 10/07/2012   Zoster Recombinat (Shingrix) 11/09/2020, 03/02/2021   Patient reports his biggest concern is still with his GUrsa He does not like how it makes him feel and this has been ongoing since he started 1.5 years ago. He has a telephone visit to discuss with oncology next week. He is hoping he does not have to continue with it but his surgeon told him it'll probably be 3 more years of therapy.  Conditions to be addressed/monitored:  Hypertension, Hyperlipidemia, Coronary Artery Disease, GERD, and Osteoarthritis  Conditions addressed this visit: Hypertension, hyperlipidemia  Care Plan : [place] Updates made by PViona Gilmore RMaywood Parksince 10/25/2021 12:00 AM     Problem: Problem: Hypertension, Hyperlipidemia, Coronary Artery Disease, GERD, and Osteoarthritis      Long-Range Goal: Patient-Specific Goal   Start Date: 03/06/2021  Expected End Date: 03/06/2022  Recent Progress: On track  Priority: High  Note:   Current Barriers:  Unable to independently monitor therapeutic efficacy Unable to achieve control of cholesterol   Pharmacist Clinical Goal(s):  Patient will achieve adherence to monitoring guidelines and medication adherence to achieve therapeutic efficacy achieve control of cholesterol as evidenced by next lipid panel  through collaboration with PharmD and provider.   Interventions: 1:1 collaboration with BBillie Ruddy MD regarding development and update of comprehensive plan of care as evidenced by provider attestation and co-signature Inter-disciplinary care team collaboration (see longitudinal plan of care) Comprehensive medication review performed; medication list updated in electronic medical record  Hypertension (BP goal <140/90) -Controlled -Current treatment: Amlodipine 5 mg 1 tablet daily -  Appropriate, Effective, Safe, Accessible -Medications previously tried: n/a  -Current home readings: usually 120s/70s; (checking every day with wrist cuff) -Current dietary habits: doesn't eat whole lot of salt; does look at package labels - eating a lot of fresh foods; eating out very little (twice a month later) -Current exercise habits: walki -Denies hypotensive/hypertensive symptoms -Educated on BP goals and benefits of medications for prevention of heart attack, stroke and kidney damage; Importance of home blood pressure monitoring; Proper BP monitoring technique; -Counseled to monitor BP at home at least weekly, document, and provide log at future appointments -Counseled on diet and exercise extensively Recommended to continue current medication Cautioned against consistent use of Advil as this can raise his blood pressure.  Hyperlipidemia: (LDL goal < 70) -Uncontrolled -Current treatment: Atorvastatin 40 mg 1/2 tablet daily - Appropriate, Query effective, Safe, Accessible -Medications previously tried: none  -Current dietary patterns: tries to eat healthy -Current exercise habits: walking daily -Educated on Cholesterol goals;  Benefits of statin for ASCVD risk reduction; -Counseled on diet and exercise extensively Recommended to continue current medication Recommended repeat lipid panel and consider increasing to 40 mg daily if LDL is above goal < 70.  GERD (Goal: minimize symptoms) -Controlled -Current treatment  Omeprazole 40 mg 1 capsule daily - Appropriate, Effective, Safe, Accessible -Medications previously tried: none  -Recommended to continue current medication Counseled on foods to limit to avoid symptoms of heartburn or acid reflux.  Rectal cancer (Goal: prevent progression of cancer) -Controlled -Current treatment  Imatinib 400 mg 1 tablet daily - Appropriate, Effective, Safe, Accessible -Medications previously tried: n/a  -Recommended to continue current  medication  Vitamin D deficiency (Goal: vitamin D 30-100) -Uncontrolled -Current treatment  Vitamin D 2000 units daily -  Appropriate, Query effective, Safe, Accessible -Medications previously tried: none  -Recommended repeat level.  Health Maintenance -Vaccine gaps: none -Current therapy:  Diphenhydramine 25 mg - taking one a day at night - taking for about a year  CBD gummies - not often -Educated on Cost vs benefit of each product must be carefully weighed by individual consumer -Patient is satisfied with current therapy and denies issues -Recommended limiting use of Benadryl due to risk of falls. Recommended timed release melatonin.  Patient Goals/Self-Care Activities Patient will:  - take medications as prescribed as evidenced by patient report and record review check blood pressure at least weekly, document, and provide at future appointments target a minimum of 150 minutes of moderate intensity exercise weekly  Follow Up Plan: The care management team will reach out to the patient again over the next 90 days.       Medication Assistance: None required.  Patient affirms current coverage meets needs.  Compliance/Adherence/Medication fill history: Care Gaps: Last BP - 126/76 on 07/20/2021  Star-Rating Drugs: Atorvastatin 70m - last filled 08/25/2021 [facility]  [facility]'s preferred pharmacy [facility]:  [id number]: [phone]: [phone] [id number] [place] [facility] [address] [place] [id number]: [phone]: [phone] Uses pill box? Yes - weekly Pt endorses 99% compliance - hardly ever  We discussed: Current pharmacy is preferred with insurance plan and patient is satisfied with pharmacy services Patient decided to: Continue current medication management strategy  Care Plan and Follow Up Patient  Decision:  Patient agrees to Care Plan and Follow-up.  Plan: The care management team will reach out to the patient again over the next 30 days.  MaJeni SallesPharmD, B[facility]ole[facility]ac[facility] [place] [facility]

## 2021-10-26 ENCOUNTER — Other Ambulatory Visit (HOSPITAL_COMMUNITY): Payer: Self-pay

## 2021-11-06 ENCOUNTER — Other Ambulatory Visit (HOSPITAL_COMMUNITY): Payer: Self-pay

## 2021-11-06 ENCOUNTER — Other Ambulatory Visit: Payer: Self-pay | Admitting: Internal Medicine

## 2021-11-06 DIAGNOSIS — C49A5 Gastrointestinal stromal tumor of rectum: Secondary | ICD-10-CM

## 2021-11-06 MED ORDER — IMATINIB MESYLATE 400 MG PO TABS
400.0000 mg | ORAL_TABLET | Freq: Every day | ORAL | 3 refills | Status: DC
  Filled 2021-11-06: qty 30, 30d supply, fill #0
  Filled 2021-12-04: qty 30, 30d supply, fill #1
  Filled 2022-01-12: qty 30, 30d supply, fill #2

## 2021-11-23 ENCOUNTER — Encounter (HOSPITAL_COMMUNITY): Admission: RE | Admit: 2021-11-23 | Payer: Medicare Other | Source: Ambulatory Visit

## 2021-11-23 ENCOUNTER — Other Ambulatory Visit (HOSPITAL_COMMUNITY): Payer: Self-pay

## 2021-11-27 ENCOUNTER — Other Ambulatory Visit (HOSPITAL_COMMUNITY): Payer: Self-pay

## 2021-12-04 ENCOUNTER — Ambulatory Visit: Payer: Self-pay | Admitting: Surgery

## 2021-12-04 ENCOUNTER — Other Ambulatory Visit (HOSPITAL_COMMUNITY): Payer: Self-pay

## 2021-12-04 DIAGNOSIS — R739 Hyperglycemia, unspecified: Secondary | ICD-10-CM

## 2021-12-04 NOTE — Progress Notes (Signed)
Pt. Needs orders for surgery.

## 2021-12-04 NOTE — Progress Notes (Signed)
For Short Stay: [place] appointment date: Date of COVID positive in last 61 days:  Bowel Prep reminder:   For Anesthesia: PCP - Dr. Grier Mitts Cardiologist - N/A  Chest x-ray - CT Chest: 07/17/21 EKG -  Stress Test -  ECHO -  Cardiac Cath -  Pacemaker/ICD device last checked: Pacemaker orders received: Device Rep notified:  Spinal Cord Stimulator:  Sleep Study -  CPAP -   Fasting Blood Sugar -  Checks Blood Sugar _____ times a day Date and result of last Hgb A1c-5.4: 10/25/21  Blood Thinner Instructions: Aspirin Instructions: Last Dose:  Activity level: Can go up a flight of stairs and activities of daily living without stopping and without chest pain and/or shortness of breath   Able to exercise without chest pain and/or shortness of breath   Unable to go up a flight of stairs without chest pain and/or shortness of breath     Anesthesia review: Hx: COPD,Heart murmur,HTN  Patient denies shortness of breath, fever, cough and chest pain at PAT appointment   Patient verbalized understanding of instructions that were given to them at the PAT appointment. Patient was also instructed that they will need to review over the PAT instructions again at home before surgery.

## 2021-12-04 NOTE — Patient Instructions (Addendum)
DUE TO COVID-19 ONLY TWO VISITORS  (aged 72 and older)  ARE ALLOWED TO COME WITH YOU AND STAY IN THE WAITING ROOM ONLY DURING PRE OP AND PROCEDURE.   **NO VISITORS ARE ALLOWED IN THE SHORT STAY AREA OR RECOVERY ROOM!!**  IF YOU WILL BE ADMITTED INTO THE HOSPITAL YOU ARE ALLOWED ONLY FOUR SUPPORT PEOPLE DURING VISITATION HOURS ONLY (7 AM -8PM)   The support person(s) must pass our screening, gel in and out, and wear a mask at all times, including in the patient's room. Patients must also wear a mask when staff or their support person are in the room. Visitors GUEST BADGE MUST BE WORN VISIBLY  One adult visitor may remain with you overnight and MUST be in the room by 8 P.M.     Your procedure is scheduled on: 12/14/21   Report to [facility] Main Entrance    Report to admitting at : 5:30 AM   Call this number if you have problems the morning of surgery [phone]   Do not eat food :After Midnight.   After Midnight you may have the following liquids until : 4:30 AM DAY OF SURGERY  Water Black Coffee (sugar ok, NO MILK/CREAM OR CREAMERS)  Tea (sugar ok, NO MILK/CREAM OR CREAMERS) regular and decaf                             Plain Jell-O (NO RED)                                           Fruit ices (not with fruit pulp, NO RED)                                     Popsicles (NO RED)                                                                  Juice: apple, WHITE grape, WHITE cranberry Sports drinks like Gatorade (NO RED)  FOLLOW BOWEL PREP AND ANY ADDITIONAL PRE OP INSTRUCTIONS YOU RECEIVED FROM YOUR SURGEON'S OFFICE!!!   Oral Hygiene is also important to reduce your risk of infection.                                    Remember - BRUSH YOUR TEETH THE MORNING OF SURGERY WITH YOUR REGULAR TOOTHPASTE   Do NOT smoke after Midnight   Take these medicines the morning of surgery with A SIP OF WATER: imatinib,amlodipine,omeprazole.  DO NOT TAKE ANY ORAL DIABETIC MEDICATIONS  DAY OF YOUR SURGERY  Bring CPAP mask and tubing day of surgery.                              You may not have any metal on your body including hair pins, jewelry, and body piercing             Do not  wear lotions, powders, perfumes/cologne, or deodorant              Men may shave face and neck.   Do not bring valuables to the hospital. [employer].   Contacts, dentures or bridgework may not be worn into surgery.   Bring small overnight bag day of surgery.   DO NOT [place]. PHARMACY WILL DISPENSE MEDICATIONS LISTED ON YOUR MEDICATION LIST TO YOU DURING YOUR ADMISSION [place]!    Patients discharged on the day of surgery will not be allowed to drive home.  Someone NEEDS to stay with you for the first 24 hours after anesthesia.   Special Instructions: Bring a copy of your healthcare power of attorney and living will documents         the day of surgery if you haven't scanned them before.              Please read over the following fact sheets you were given: IF YOU HAVE QUESTIONS ABOUT YOUR PRE-OP INSTRUCTIONS PLEASE CALL [phone]     [facility] - Preparing for Surgery Before surgery, you can play an important role.  Because skin is not sterile, your skin needs to be as free of germs as possible.  You can reduce the number of germs on your skin by washing with CHG (chlorahexidine gluconate) soap before surgery.  CHG is an antiseptic cleaner which kills germs and bonds with the skin to continue killing germs even after washing. Please DO NOT use if you have an allergy to CHG or antibacterial soaps.  If your skin becomes reddened/irritated stop using the CHG and inform your nurse when you arrive at Short Stay. Do not shave (including legs and underarms) for at least 48 hours prior to the first CHG shower.  You may shave your face/neck. Please follow these instructions carefully:  1.  Shower with  CHG Soap the night before surgery and the  morning of Surgery.  2.  If you choose to wash your hair, wash your hair first as usual with your  normal  shampoo.  3.  After you shampoo, rinse your hair and body thoroughly to remove the  shampoo.                           4.  Use CHG as you would any other liquid soap.  You can apply chg directly  to the skin and wash                       Gently with a scrungie or clean washcloth.  5.  Apply the CHG Soap to your body ONLY FROM THE NECK DOWN.   Do not use on face/ open                           Wound or open sores. Avoid contact with eyes, ears mouth and genitals (private parts).                       Wash face,  Genitals (private parts) with your normal soap.             6.  Wash thoroughly, paying special attention to the area where your surgery  will  be performed.  7.  Thoroughly rinse your body with warm water from the neck down.  8.  DO NOT shower/wash with your normal soap after using and rinsing off  the CHG Soap.                9.  Pat yourself dry with a clean towel.            10.  Wear clean pajamas.            11.  Place clean sheets on your bed the night of your first shower and do not  sleep with pets. Day of Surgery : Do not apply any lotions/deodorants the morning of surgery.  Please wear clean clothes to the hospital/surgery center.  FAILURE TO FOLLOW THESE INSTRUCTIONS MAY RESULT IN THE CANCELLATION OF YOUR SURGERY PATIENT SIGNATURE_________________________________  NURSE SIGNATURE__________________________________  ________________________________________________________________________  WHAT IS A BLOOD TRANSFUSION? Blood Transfusion Information  A transfusion is the replacement of blood or some of its parts. Blood is made up of multiple cells which provide different functions. Red blood cells carry oxygen and are used for blood loss replacement. White blood cells fight against infection. Platelets control bleeding. Plasma  helps clot blood. Other blood products are available for specialized needs, such as hemophilia or other clotting disorders. BEFORE THE TRANSFUSION  Who gives blood for transfusions?  Healthy volunteers who are fully evaluated to make sure their blood is safe. This is blood bank blood. Transfusion therapy is the safest it has ever been in the practice of medicine. Before blood is taken from a donor, a complete history is taken to make sure that person has no history of diseases nor engages in risky social behavior (examples are intravenous drug use or sexual activity with multiple partners). The donor's travel history is screened to minimize risk of transmitting infections, such as malaria. The donated blood is tested for signs of infectious diseases, such as HIV and hepatitis. The blood is then tested to be sure it is compatible with you in order to minimize the chance of a transfusion reaction. If you or a relative donates blood, this is often done in anticipation of surgery and is not appropriate for emergency situations. It takes many days to process the donated blood. RISKS AND COMPLICATIONS Although transfusion therapy is very safe and saves many lives, the main dangers of transfusion include:  Getting an infectious disease. Developing a transfusion reaction. This is an allergic reaction to something in the blood you were given. Every precaution is taken to prevent this. The decision to have a blood transfusion has been considered carefully by your caregiver before blood is given. Blood is not given unless the benefits outweigh the risks. AFTER THE TRANSFUSION Right after receiving a blood transfusion, you will usually feel much better and more energetic. This is especially true if your red blood cells have gotten low (anemic). The transfusion raises the level of the red blood cells which carry oxygen, and this usually causes an energy increase. The nurse administering the transfusion will monitor  you carefully for complications. HOME CARE INSTRUCTIONS  No special instructions are needed after a transfusion. You may find your energy is better. Speak with your caregiver about any limitations on activity for underlying diseases you may have. SEEK MEDICAL CARE IF:  Your condition is not improving after your transfusion. You develop redness or irritation at the intravenous (IV) site. SEEK IMMEDIATE MEDICAL CARE IF:  Any of the following symptoms occur over the next  12 hours: Shaking chills. You have a temperature by mouth above 102 F (38.9 C), not controlled by medicine. Chest, back, or muscle pain. People around you feel you are not acting correctly or are confused. Shortness of breath or difficulty breathing. Dizziness and fainting. You get a rash or develop hives. You have a decrease in urine output. Your urine turns a dark color or changes to pink, red, or brown. Any of the following symptoms occur over the next 10 days: You have a temperature by mouth above 102 F (38.9 C), not controlled by medicine. Shortness of breath. Weakness after normal activity. The white part of the eye turns yellow (jaundice). You have a decrease in the amount of urine or are urinating less often. Your urine turns a dark color or changes to pink, red, or brown. Document Released: 03/30/2000 Document Revised: 06/25/2011 Document Reviewed: 11/17/2007 [facility] Patient Information 2014 [place], [place].  _______________________________________________________________________

## 2021-12-05 ENCOUNTER — Other Ambulatory Visit: Payer: Self-pay

## 2021-12-05 ENCOUNTER — Encounter (HOSPITAL_COMMUNITY)
Admission: RE | Admit: 2021-12-05 | Discharge: 2021-12-05 | Disposition: A | Discharge status: Home / Self Care | Payer: Medicare Other | Source: Ambulatory Visit | Attending: Surgery | Admitting: Surgery

## 2021-12-05 ENCOUNTER — Encounter (HOSPITAL_COMMUNITY): Payer: Self-pay

## 2021-12-05 VITALS — BP 142/73 | HR 64 | Temp 98.1°F | Ht 69.0 in | Wt 171.0 lb

## 2021-12-05 DIAGNOSIS — R739 Hyperglycemia, unspecified: Secondary | ICD-10-CM | POA: Diagnosis not present

## 2021-12-05 DIAGNOSIS — I10 Essential (primary) hypertension: Secondary | ICD-10-CM | POA: Insufficient documentation

## 2021-12-05 DIAGNOSIS — Z01818 Encounter for other preprocedural examination: Secondary | ICD-10-CM

## 2021-12-05 LAB — CBC
HCT: 35.3 % — ABNORMAL LOW (ref 39.0–52.0)
Hemoglobin: 11.9 g/dL — ABNORMAL LOW (ref 13.0–17.0)
MCH: 34.9 pg — ABNORMAL HIGH (ref 26.0–34.0)
MCHC: 33.7 g/dL (ref 30.0–36.0)
MCV: 103.5 fL — ABNORMAL HIGH (ref 80.0–100.0)
Platelets: 135 10*3/uL — ABNORMAL LOW (ref 150–400)
RBC: 3.41 MIL/uL — ABNORMAL LOW (ref 4.22–5.81)
RDW: 13.6 % (ref 11.5–15.5)
WBC: 5.3 10*3/uL (ref 4.0–10.5)
nRBC: 0 % (ref 0.0–0.2)

## 2021-12-05 LAB — HEMOGLOBIN A1C
Hgb A1c MFr Bld: 5 % (ref 4.8–5.6)
Mean Plasma Glucose: 96.8 mg/dL

## 2021-12-05 LAB — BASIC METABOLIC PANEL
Anion gap: 6 (ref 5–15)
BUN: 22 mg/dL (ref 8–23)
CO2: 27 mmol/L (ref 22–32)
Calcium: 9 mg/dL (ref 8.9–10.3)
Chloride: 108 mmol/L (ref 98–111)
Creatinine, Ser: 0.85 mg/dL (ref 0.61–1.24)
GFR, Estimated: >60 mL/min (ref >60)
Glucose, Bld: 113 mg/dL — ABNORMAL HIGH (ref 70–99)
Potassium: 4.3 mmol/L (ref 3.5–5.1)
Sodium: 141 mmol/L (ref 135–145)

## 2021-12-05 NOTE — Consult Note (Signed)
[facility] Nurse requested for preoperative stoma site marking  Discussed surgical procedure and stoma creation with patient and family.  Explained role of the [facility] nurse team.  Provided the patient with educational booklet and provided samples of pouching options.   Examined patient sitting and standing in order to place the marking in the patient's visual field, away from any creases or abdominal contour issues and within the rectus muscle.     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  Patient's abdomen cleansed with CHG wipes at site markings, allowed to air dry prior to marking.Covered mark with thin film transparent dressing to preserve mark until date of surgery.   [place] Nurse team will follow up with patient after surgery for continue ostomy care and teaching.  [place] MSN, [place], [place], [place]

## 2021-12-13 NOTE — Anesthesia Preprocedure Evaluation (Signed)
Anesthesia Evaluation  Patient identified by MRN, date of birth, ID band Patient awake    Reviewed: Allergy & Precautions, H&P , NPO status , Patient's Chart, lab work & pertinent test results  Airway Mallampati: II  TM Distance: >3 FB Neck ROM: Full    Dental no notable dental hx. (+) Teeth Intact, Dental Advisory Given   Pulmonary COPD, former smoker,    Pulmonary exam normal breath sounds clear to auscultation       Cardiovascular Exercise Tolerance: Good hypertension, Pt. on medications  Rhythm:Regular Rate:Normal     Neuro/Psych negative neurological ROS  negative psych ROS   GI/Hepatic Neg liver ROS, GERD  Medicated,  Endo/Other  negative endocrine ROS  Renal/GU negative Renal ROS  negative genitourinary   Musculoskeletal  (+) Arthritis , Osteoarthritis,    Abdominal   Peds  Hematology  (+) Blood dyscrasia, anemia ,   Anesthesia Other Findings   Reproductive/Obstetrics negative OB ROS                            Anesthesia Physical Anesthesia Plan  ASA: 2  Anesthesia Plan: General   Post-op Pain Management: Tylenol PO (pre-op)*   Induction: Intravenous  PONV Risk Score and Plan: 3 and Ondansetron, Dexamethasone and Midazolam  Airway Management Planned: Oral ETT  Additional Equipment:   Intra-op Plan:   Post-operative Plan: Extubation in OR  Informed Consent: I have reviewed the patients History and Physical, chart, labs and discussed the procedure including the risks, benefits and alternatives for the proposed anesthesia with the patient or authorized representative who has indicated his/her understanding and acceptance.     Dental advisory given  Plan Discussed with: CRNA  Anesthesia Plan Comments:        Anesthesia Quick Evaluation

## 2021-12-14 ENCOUNTER — Encounter (HOSPITAL_COMMUNITY)
Admission: RE | Disposition: A | Discharge status: Home Health | Payer: Self-pay | Source: Home / Self Care | Attending: Surgery

## 2021-12-14 ENCOUNTER — Other Ambulatory Visit: Payer: Self-pay

## 2021-12-14 ENCOUNTER — Inpatient Hospital Stay (HOSPITAL_COMMUNITY): Payer: Medicare Other | Admitting: Anesthesiology

## 2021-12-14 ENCOUNTER — Inpatient Hospital Stay (HOSPITAL_COMMUNITY)
Admission: RE | Admit: 2021-12-14 | Discharge: 2021-12-18 | DRG: 330 | Disposition: A | Discharge status: Home Health | Payer: Medicare Other | Attending: Surgery | Admitting: Surgery

## 2021-12-14 ENCOUNTER — Inpatient Hospital Stay (HOSPITAL_COMMUNITY): Payer: Medicare Other | Admitting: Physician Assistant

## 2021-12-14 ENCOUNTER — Encounter (HOSPITAL_COMMUNITY): Payer: Self-pay | Admitting: Surgery

## 2021-12-14 DIAGNOSIS — Z8249 Family history of ischemic heart disease and other diseases of the circulatory system: Secondary | ICD-10-CM | POA: Diagnosis not present

## 2021-12-14 DIAGNOSIS — C3492 Malignant neoplasm of unspecified part of left bronchus or lung: Secondary | ICD-10-CM | POA: Diagnosis present

## 2021-12-14 DIAGNOSIS — Z818 Family history of other mental and behavioral disorders: Secondary | ICD-10-CM | POA: Diagnosis not present

## 2021-12-14 DIAGNOSIS — K7689 Other specified diseases of liver: Secondary | ICD-10-CM

## 2021-12-14 DIAGNOSIS — Z801 Family history of malignant neoplasm of trachea, bronchus and lung: Secondary | ICD-10-CM

## 2021-12-14 DIAGNOSIS — E785 Hyperlipidemia, unspecified: Secondary | ICD-10-CM | POA: Diagnosis present

## 2021-12-14 DIAGNOSIS — Z833 Family history of diabetes mellitus: Secondary | ICD-10-CM | POA: Diagnosis not present

## 2021-12-14 DIAGNOSIS — Z811 Family history of alcohol abuse and dependence: Secondary | ICD-10-CM | POA: Diagnosis not present

## 2021-12-14 DIAGNOSIS — K5909 Other constipation: Secondary | ICD-10-CM | POA: Diagnosis present

## 2021-12-14 DIAGNOSIS — Z79899 Other long term (current) drug therapy: Secondary | ICD-10-CM | POA: Diagnosis not present

## 2021-12-14 DIAGNOSIS — Z96653 Presence of artificial knee joint, bilateral: Secondary | ICD-10-CM | POA: Diagnosis present

## 2021-12-14 DIAGNOSIS — N400 Enlarged prostate without lower urinary tract symptoms: Secondary | ICD-10-CM | POA: Diagnosis present

## 2021-12-14 DIAGNOSIS — N321 Vesicointestinal fistula: Secondary | ICD-10-CM | POA: Diagnosis not present

## 2021-12-14 DIAGNOSIS — J449 Chronic obstructive pulmonary disease, unspecified: Secondary | ICD-10-CM | POA: Diagnosis not present

## 2021-12-14 DIAGNOSIS — E7800 Pure hypercholesterolemia, unspecified: Secondary | ICD-10-CM | POA: Diagnosis not present

## 2021-12-14 DIAGNOSIS — I10 Essential (primary) hypertension: Secondary | ICD-10-CM | POA: Diagnosis not present

## 2021-12-14 DIAGNOSIS — K219 Gastro-esophageal reflux disease without esophagitis: Secondary | ICD-10-CM | POA: Diagnosis not present

## 2021-12-14 DIAGNOSIS — C49A0 Gastrointestinal stromal tumor, unspecified site: Secondary | ICD-10-CM | POA: Diagnosis present

## 2021-12-14 DIAGNOSIS — K2940 Chronic atrophic gastritis without bleeding: Secondary | ICD-10-CM | POA: Diagnosis not present

## 2021-12-14 DIAGNOSIS — Z87891 Personal history of nicotine dependence: Secondary | ICD-10-CM | POA: Diagnosis not present

## 2021-12-14 DIAGNOSIS — X58XXXA Exposure to other specified factors, initial encounter: Secondary | ICD-10-CM | POA: Diagnosis present

## 2021-12-14 DIAGNOSIS — Z8551 Personal history of malignant neoplasm of bladder: Secondary | ICD-10-CM | POA: Diagnosis not present

## 2021-12-14 DIAGNOSIS — D490 Neoplasm of unspecified behavior of digestive system: Principal | ICD-10-CM | POA: Diagnosis present

## 2021-12-14 DIAGNOSIS — M1990 Unspecified osteoarthritis, unspecified site: Secondary | ICD-10-CM | POA: Diagnosis present

## 2021-12-14 DIAGNOSIS — Z86010 Personal history of colonic polyps: Secondary | ICD-10-CM | POA: Diagnosis not present

## 2021-12-14 DIAGNOSIS — C49A5 Gastrointestinal stromal tumor of rectum: Principal | ICD-10-CM | POA: Diagnosis present

## 2021-12-14 DIAGNOSIS — T451X5A Adverse effect of antineoplastic and immunosuppressive drugs, initial encounter: Secondary | ICD-10-CM | POA: Diagnosis not present

## 2021-12-14 DIAGNOSIS — Z85118 Personal history of other malignant neoplasm of bronchus and lung: Secondary | ICD-10-CM | POA: Diagnosis not present

## 2021-12-14 DIAGNOSIS — K5780 Diverticulitis of intestine, part unspecified, with perforation and abscess without bleeding: Secondary | ICD-10-CM | POA: Diagnosis not present

## 2021-12-14 DIAGNOSIS — Z932 Ileostomy status: Secondary | ICD-10-CM

## 2021-12-14 DIAGNOSIS — Z902 Acquired absence of lung [part of]: Secondary | ICD-10-CM | POA: Diagnosis not present

## 2021-12-14 DIAGNOSIS — K769 Liver disease, unspecified: Secondary | ICD-10-CM | POA: Diagnosis not present

## 2021-12-14 DIAGNOSIS — D135 Benign neoplasm of extrahepatic bile ducts: Secondary | ICD-10-CM | POA: Diagnosis not present

## 2021-12-14 HISTORY — PX: XI ROBOTIC ASSISTED LOWER ANTERIOR RESECTION: SHX6558

## 2021-12-14 HISTORY — PX: DIVERTING ILEOSTOMY: SHX5799

## 2021-12-14 LAB — TYPE AND SCREEN
ABO/RH(D): O POS
Antibody Screen: NEGATIVE

## 2021-12-14 SURGERY — RESECTION, RECTUM, LOW ANTERIOR, ROBOT-ASSISTED
Anesthesia: General

## 2021-12-14 MED ORDER — ENSURE PRE-SURGERY PO LIQD: 296.0000 mL | Freq: Once | ORAL | Status: DC

## 2021-12-14 MED ORDER — LIDOCAINE HCL (PF) 2 % IJ SOLN
INTRAMUSCULAR | Status: DC | PRN
  Administered 2021-12-14: 1.5 mg/kg/h via INTRADERMAL

## 2021-12-14 MED ORDER — ONDANSETRON HCL 4 MG PO TABS
4.0000 mg | ORAL_TABLET | Freq: Four times a day (QID) | ORAL | Status: DC | PRN
  Administered 2021-12-15: 4 mg via ORAL
  Filled 2021-12-14: qty 1

## 2021-12-14 MED ORDER — FENTANYL CITRATE (PF) 250 MCG/5ML IJ SOLN
INTRAMUSCULAR | Status: AC
  Filled 2021-12-14: qty 5

## 2021-12-14 MED ORDER — ROCURONIUM BROMIDE 10 MG/ML (PF) SYRINGE
PREFILLED_SYRINGE | INTRAVENOUS | Status: AC
  Filled 2021-12-14: qty 20

## 2021-12-14 MED ORDER — PROCHLORPERAZINE EDISYLATE 10 MG/2ML IJ SOLN: 5.0000 mg | Freq: Four times a day (QID) | INTRAMUSCULAR | Status: DC | PRN

## 2021-12-14 MED ORDER — METRONIDAZOLE 500 MG PO TABS: 1000.0000 mg | ORAL_TABLET | ORAL | Status: DC

## 2021-12-14 MED ORDER — ATORVASTATIN CALCIUM 20 MG PO TABS
20.0000 mg | ORAL_TABLET | Freq: Every day | ORAL | Status: DC
  Administered 2021-12-14 – 2021-12-18 (×5): 20 mg via ORAL
  Filled 2021-12-14 (×5): qty 1

## 2021-12-14 MED ORDER — ACETAMINOPHEN 500 MG PO TABS
1000.0000 mg | ORAL_TABLET | ORAL | Status: AC
  Administered 2021-12-14: 1000 mg via ORAL
  Filled 2021-12-14: qty 2

## 2021-12-14 MED ORDER — HYDROCORT-PRAMOXINE (PERIANAL) 2.5-1 % EX CREA: TOPICAL_CREAM | Freq: Four times a day (QID) | CUTANEOUS | Status: DC | PRN

## 2021-12-14 MED ORDER — NEOMYCIN SULFATE 500 MG PO TABS: 1000.0000 mg | ORAL_TABLET | ORAL | Status: DC

## 2021-12-14 MED ORDER — ALVIMOPAN 12 MG PO CAPS
12.0000 mg | ORAL_CAPSULE | ORAL | Status: AC
  Administered 2021-12-14: 12 mg via ORAL
  Filled 2021-12-14: qty 1

## 2021-12-14 MED ORDER — BUPIVACAINE LIPOSOME 1.3 % IJ SUSP
INTRAMUSCULAR | Status: AC
  Filled 2021-12-14: qty 20

## 2021-12-14 MED ORDER — CHLORHEXIDINE GLUCONATE CLOTH 2 % EX PADS: 6.0000 | MEDICATED_PAD | Freq: Once | CUTANEOUS | Status: DC

## 2021-12-14 MED ORDER — PHENYLEPHRINE 80 MCG/ML (10ML) SYRINGE FOR IV PUSH (FOR BLOOD PRESSURE SUPPORT)
PREFILLED_SYRINGE | INTRAVENOUS | Status: AC
Start: 2021-12-14 — End: ?
  Filled 2021-12-14: qty 10

## 2021-12-14 MED ORDER — LACTATED RINGERS IV SOLN: INTRAVENOUS | Status: DC

## 2021-12-14 MED ORDER — DEXAMETHASONE SODIUM PHOSPHATE 10 MG/ML IJ SOLN
INTRAMUSCULAR | Status: AC
Start: 2021-12-14 — End: ?
  Filled 2021-12-14: qty 1

## 2021-12-14 MED ORDER — PHENYLEPHRINE HCL (PRESSORS) 10 MG/ML IV SOLN
INTRAVENOUS | Status: AC
  Filled 2021-12-14: qty 1

## 2021-12-14 MED ORDER — DIPHENHYDRAMINE HCL 50 MG/ML IJ SOLN: 12.5000 mg | Freq: Four times a day (QID) | INTRAMUSCULAR | Status: DC | PRN

## 2021-12-14 MED ORDER — HYDROMORPHONE HCL 1 MG/ML IJ SOLN: 0.2500 mg | INTRAMUSCULAR | Status: DC | PRN

## 2021-12-14 MED ORDER — STERILE WATER FOR INJECTION IJ SOLN
INTRAMUSCULAR | Status: DC | PRN
  Administered 2021-12-14: 20 mL via INTRAMUSCULAR

## 2021-12-14 MED ORDER — PHENYLEPHRINE HCL (PRESSORS) 10 MG/ML IV SOLN
INTRAVENOUS | Status: DC | PRN
  Administered 2021-12-14: 80 ug via INTRAVENOUS
  Administered 2021-12-14 (×2): 160 ug via INTRAVENOUS

## 2021-12-14 MED ORDER — SUGAMMADEX SODIUM 500 MG/5ML IV SOLN
INTRAVENOUS | Status: DC | PRN
  Administered 2021-12-14: 400 mg via INTRAVENOUS

## 2021-12-14 MED ORDER — PROSIGHT PO TABS
1.0000 | ORAL_TABLET | Freq: Two times a day (BID) | ORAL | Status: DC
Start: 2021-12-14 — End: 2021-12-18
  Administered 2021-12-14 – 2021-12-18 (×8): 1 via ORAL
  Filled 2021-12-14 (×8): qty 1

## 2021-12-14 MED ORDER — SIMETHICONE 80 MG PO CHEW: 40.0000 mg | CHEWABLE_TABLET | Freq: Four times a day (QID) | ORAL | Status: DC | PRN

## 2021-12-14 MED ORDER — ENOXAPARIN SODIUM 40 MG/0.4ML IJ SOSY
40.0000 mg | PREFILLED_SYRINGE | Freq: Once | INTRAMUSCULAR | Status: AC
  Administered 2021-12-14: 40 mg via SUBCUTANEOUS
  Filled 2021-12-14: qty 0.4

## 2021-12-14 MED ORDER — SODIUM CHLORIDE 0.9% FLUSH: 3.0000 mL | INTRAVENOUS | Status: DC | PRN

## 2021-12-14 MED ORDER — ORAL CARE MOUTH RINSE: 15.0000 mL | Freq: Once | OROMUCOSAL | Status: AC

## 2021-12-14 MED ORDER — BUPIVACAINE-EPINEPHRINE (PF) 0.25% -1:200000 IJ SOLN
INTRAMUSCULAR | Status: AC
  Filled 2021-12-14: qty 60

## 2021-12-14 MED ORDER — SODIUM CHLORIDE 0.9 % IV SOLN: 250.0000 mL | INTRAVENOUS | Status: DC | PRN

## 2021-12-14 MED ORDER — SODIUM CHLORIDE 0.9 % IR SOLN
Status: DC | PRN
  Administered 2021-12-14: 1000 mL

## 2021-12-14 MED ORDER — BUPIVACAINE-EPINEPHRINE (PF) 0.25% -1:200000 IJ SOLN
INTRAMUSCULAR | Status: DC | PRN
  Administered 2021-12-14: 60 mL

## 2021-12-14 MED ORDER — MIDAZOLAM HCL 5 MG/5ML IJ SOLN
INTRAMUSCULAR | Status: DC | PRN
  Administered 2021-12-14: 2 mg via INTRAVENOUS

## 2021-12-14 MED ORDER — LACTATED RINGERS IV SOLN: INTRAVENOUS | Status: DC | PRN

## 2021-12-14 MED ORDER — ONDANSETRON HCL 4 MG/2ML IJ SOLN
INTRAMUSCULAR | Status: DC | PRN
  Administered 2021-12-14: 4 mg via INTRAVENOUS

## 2021-12-14 MED ORDER — SODIUM CHLORIDE 0.9 % IV SOLN
2.0000 g | Freq: Two times a day (BID) | INTRAVENOUS | Status: AC
  Administered 2021-12-14: 2 g via INTRAVENOUS
  Filled 2021-12-14: qty 2

## 2021-12-14 MED ORDER — CALCIUM POLYCARBOPHIL 625 MG PO TABS
625.0000 mg | ORAL_TABLET | Freq: Two times a day (BID) | ORAL | Status: DC
  Administered 2021-12-14 – 2021-12-18 (×8): 625 mg via ORAL
  Filled 2021-12-14 (×8): qty 1

## 2021-12-14 MED ORDER — SALINE SPRAY 0.65 % NA SOLN: 1.0000 | Freq: Four times a day (QID) | NASAL | Status: DC | PRN

## 2021-12-14 MED ORDER — PROPOFOL 10 MG/ML IV BOLUS
INTRAVENOUS | Status: DC | PRN
  Administered 2021-12-14: 140 mg via INTRAVENOUS

## 2021-12-14 MED ORDER — ONDANSETRON HCL 4 MG/2ML IJ SOLN
INTRAMUSCULAR | Status: AC
Start: 2021-12-14 — End: ?
  Filled 2021-12-14: qty 2

## 2021-12-14 MED ORDER — PROCHLORPERAZINE MALEATE 10 MG PO TABS: 10.0000 mg | ORAL_TABLET | Freq: Four times a day (QID) | ORAL | Status: DC | PRN

## 2021-12-14 MED ORDER — POLYETHYLENE GLYCOL 3350 17 GM/SCOOP PO POWD: 1.0000 | Freq: Once | ORAL | Status: DC

## 2021-12-14 MED ORDER — ENSURE PRE-SURGERY PO LIQD: 592.0000 mL | Freq: Once | ORAL | Status: DC

## 2021-12-14 MED ORDER — 0.9 % SODIUM CHLORIDE (POUR BTL) OPTIME
TOPICAL | Status: DC | PRN
  Administered 2021-12-14: 2000 mL

## 2021-12-14 MED ORDER — EPHEDRINE SULFATE (PRESSORS) 50 MG/ML IJ SOLN
INTRAMUSCULAR | Status: DC | PRN
  Administered 2021-12-14: 10 mg via INTRAVENOUS
  Administered 2021-12-14: 5 mg via INTRAVENOUS

## 2021-12-14 MED ORDER — CHLORHEXIDINE GLUCONATE 0.12 % MT SOLN
15.0000 mL | Freq: Once | OROMUCOSAL | Status: AC
  Administered 2021-12-14: 15 mL via OROMUCOSAL

## 2021-12-14 MED ORDER — METHOCARBAMOL 500 MG PO TABS: 1000.0000 mg | ORAL_TABLET | Freq: Four times a day (QID) | ORAL | Status: DC | PRN

## 2021-12-14 MED ORDER — ROCURONIUM BROMIDE 100 MG/10ML IV SOLN
INTRAVENOUS | Status: DC | PRN
  Administered 2021-12-14 (×2): 30 mg via INTRAVENOUS
  Administered 2021-12-14: 70 mg via INTRAVENOUS

## 2021-12-14 MED ORDER — HYDRALAZINE HCL 20 MG/ML IJ SOLN: 10.0000 mg | INTRAMUSCULAR | Status: DC | PRN

## 2021-12-14 MED ORDER — NAPHAZOLINE-GLYCERIN 0.012-0.25 % OP SOLN: 1.0000 [drp] | Freq: Four times a day (QID) | OPHTHALMIC | Status: DC | PRN

## 2021-12-14 MED ORDER — WITCH HAZEL-GLYCERIN EX PADS: MEDICATED_PAD | CUTANEOUS | Status: DC | PRN

## 2021-12-14 MED ORDER — FENTANYL CITRATE (PF) 100 MCG/2ML IJ SOLN
INTRAMUSCULAR | Status: DC | PRN
  Administered 2021-12-14: 100 ug via INTRAVENOUS
  Administered 2021-12-14 (×3): 50 ug via INTRAVENOUS

## 2021-12-14 MED ORDER — MAGIC MOUTHWASH
15.0000 mL | Freq: Four times a day (QID) | ORAL | Status: DC | PRN
  Filled 2021-12-14: qty 15

## 2021-12-14 MED ORDER — LIDOCAINE HCL (CARDIAC) PF 100 MG/5ML IV SOSY
PREFILLED_SYRINGE | INTRAVENOUS | Status: DC | PRN
  Administered 2021-12-14: 60 mg via INTRAVENOUS

## 2021-12-14 MED ORDER — LIDOCAINE HCL (PF) 2 % IJ SOLN
INTRAMUSCULAR | Status: AC
Start: 2021-12-14 — End: ?
  Filled 2021-12-14: qty 15

## 2021-12-14 MED ORDER — STERILE WATER FOR INJECTION IJ SOLN
INTRAMUSCULAR | Status: AC
  Filled 2021-12-14: qty 10

## 2021-12-14 MED ORDER — ENOXAPARIN SODIUM 40 MG/0.4ML IJ SOSY
40.0000 mg | PREFILLED_SYRINGE | INTRAMUSCULAR | Status: DC
  Administered 2021-12-15 – 2021-12-18 (×4): 40 mg via SUBCUTANEOUS
  Filled 2021-12-14 (×4): qty 0.4

## 2021-12-14 MED ORDER — PHENYLEPHRINE HCL (PRESSORS) 10 MG/ML IV SOLN
INTRAVENOUS | Status: AC
Start: 2021-12-14 — End: ?
  Filled 2021-12-14: qty 1

## 2021-12-14 MED ORDER — SODIUM CHLORIDE 0.9 % IV SOLN
2.0000 g | INTRAVENOUS | Status: AC
  Administered 2021-12-14: 2 g via INTRAVENOUS
  Filled 2021-12-14: qty 2

## 2021-12-14 MED ORDER — METOPROLOL TARTRATE 5 MG/5ML IV SOLN: 5.0000 mg | Freq: Four times a day (QID) | INTRAVENOUS | Status: DC | PRN

## 2021-12-14 MED ORDER — AMLODIPINE BESYLATE 5 MG PO TABS
5.0000 mg | ORAL_TABLET | Freq: Every day | ORAL | Status: DC
  Administered 2021-12-15 – 2021-12-18 (×4): 5 mg via ORAL
  Filled 2021-12-14 (×4): qty 1

## 2021-12-14 MED ORDER — DIPHENHYDRAMINE HCL 12.5 MG/5ML PO ELIX: 12.5000 mg | ORAL_SOLUTION | Freq: Four times a day (QID) | ORAL | Status: DC | PRN

## 2021-12-14 MED ORDER — FENTANYL CITRATE (PF) 100 MCG/2ML IJ SOLN
INTRAMUSCULAR | Status: AC
  Filled 2021-12-14: qty 2

## 2021-12-14 MED ORDER — ENSURE SURGERY PO LIQD
237.0000 mL | Freq: Two times a day (BID) | ORAL | Status: DC
Start: 2021-12-14 — End: 2021-12-18
  Administered 2021-12-14 – 2021-12-18 (×5): 237 mL via ORAL

## 2021-12-14 MED ORDER — ROCURONIUM BROMIDE 10 MG/ML (PF) SYRINGE
PREFILLED_SYRINGE | INTRAVENOUS | Status: AC
Start: 2021-12-14 — End: ?
  Filled 2021-12-14: qty 20

## 2021-12-14 MED ORDER — HYDROMORPHONE HCL 1 MG/ML IJ SOLN
0.5000 mg | INTRAMUSCULAR | Status: DC | PRN
  Filled 2021-12-14: qty 1

## 2021-12-14 MED ORDER — GABAPENTIN 300 MG PO CAPS
300.0000 mg | ORAL_CAPSULE | ORAL | Status: AC
  Administered 2021-12-14: 300 mg via ORAL
  Filled 2021-12-14: qty 1

## 2021-12-14 MED ORDER — ALUM & MAG HYDROXIDE-SIMETH 200-200-20 MG/5ML PO SUSP: 30.0000 mL | Freq: Four times a day (QID) | ORAL | Status: DC | PRN

## 2021-12-14 MED ORDER — PHENYLEPHRINE HCL-NACL 20-0.9 MG/250ML-% IV SOLN
INTRAVENOUS | Status: DC | PRN
  Administered 2021-12-14: 20 ug/min via INTRAVENOUS

## 2021-12-14 MED ORDER — SODIUM CHLORIDE 0.9% FLUSH
3.0000 mL | Freq: Two times a day (BID) | INTRAVENOUS | Status: DC
  Administered 2021-12-14 – 2021-12-18 (×7): 3 mL via INTRAVENOUS

## 2021-12-14 MED ORDER — LIP MEDEX EX OINT
TOPICAL_OINTMENT | Freq: Two times a day (BID) | CUTANEOUS | Status: DC
  Administered 2021-12-14 – 2021-12-15 (×2): 75 via TOPICAL
  Administered 2021-12-16: 1 via TOPICAL
  Administered 2021-12-16 – 2021-12-17 (×2): 75 via TOPICAL
  Filled 2021-12-14: qty 7

## 2021-12-14 MED ORDER — PROPOFOL 10 MG/ML IV BOLUS
INTRAVENOUS | Status: AC
  Filled 2021-12-14: qty 20

## 2021-12-14 MED ORDER — METHOCARBAMOL 1000 MG/10ML IJ SOLN
1000.0000 mg | Freq: Four times a day (QID) | INTRAVENOUS | Status: DC | PRN
  Filled 2021-12-14: qty 10

## 2021-12-14 MED ORDER — GLYCOPYRROLATE 0.2 MG/ML IJ SOLN
INTRAMUSCULAR | Status: DC | PRN
  Administered 2021-12-14: .2 mg via INTRAVENOUS

## 2021-12-14 MED ORDER — ACETAMINOPHEN 500 MG PO TABS
1000.0000 mg | ORAL_TABLET | Freq: Four times a day (QID) | ORAL | Status: DC
  Administered 2021-12-14 – 2021-12-18 (×16): 1000 mg via ORAL
  Filled 2021-12-14 (×17): qty 2

## 2021-12-14 MED ORDER — MIDAZOLAM HCL 2 MG/2ML IJ SOLN
INTRAMUSCULAR | Status: AC
  Filled 2021-12-14: qty 2

## 2021-12-14 MED ORDER — TRAMADOL HCL 50 MG PO TABS
50.0000 mg | ORAL_TABLET | Freq: Four times a day (QID) | ORAL | Status: DC | PRN
  Administered 2021-12-14: 50 mg via ORAL
  Administered 2021-12-15: 100 mg via ORAL
  Administered 2021-12-15: 50 mg via ORAL
  Filled 2021-12-14: qty 2
  Filled 2021-12-14 (×2): qty 1

## 2021-12-14 MED ORDER — BUPIVACAINE LIPOSOME 1.3 % IJ SUSP
INTRAMUSCULAR | Status: DC | PRN
  Administered 2021-12-14: 20 mL

## 2021-12-14 MED ORDER — SPY AGENT GREEN - (INDOCYANINE FOR INJECTION)
INTRAMUSCULAR | Status: DC | PRN
  Administered 2021-12-14: 2.5 mL via INTRAVENOUS
  Administered 2021-12-14: 5 mL via INTRAVENOUS

## 2021-12-14 MED ORDER — BUPIVACAINE LIPOSOME 1.3 % IJ SUSP: 20.0000 mL | Freq: Once | INTRAMUSCULAR | Status: DC

## 2021-12-14 MED ORDER — ONDANSETRON HCL 4 MG/2ML IJ SOLN
4.0000 mg | Freq: Four times a day (QID) | INTRAMUSCULAR | Status: DC | PRN
  Administered 2021-12-14: 4 mg via INTRAVENOUS
  Filled 2021-12-14: qty 2

## 2021-12-14 MED ORDER — LACTATED RINGERS IV BOLUS: 1000.0000 mL | Freq: Three times a day (TID) | INTRAVENOUS | Status: AC | PRN

## 2021-12-14 MED ORDER — MELATONIN 3 MG PO TABS
3.0000 mg | ORAL_TABLET | Freq: Every evening | ORAL | Status: DC | PRN
  Administered 2021-12-14 – 2021-12-17 (×4): 3 mg via ORAL
  Filled 2021-12-14 (×4): qty 1

## 2021-12-14 MED ORDER — BISACODYL 5 MG PO TBEC: 20.0000 mg | DELAYED_RELEASE_TABLET | Freq: Once | ORAL | Status: DC

## 2021-12-14 MED ORDER — DEXAMETHASONE SODIUM PHOSPHATE 10 MG/ML IJ SOLN
INTRAMUSCULAR | Status: DC | PRN
  Administered 2021-12-14: 8 mg via INTRAVENOUS

## 2021-12-14 MED ORDER — PANTOPRAZOLE SODIUM 40 MG PO TBEC
40.0000 mg | DELAYED_RELEASE_TABLET | Freq: Every day | ORAL | Status: DC
  Administered 2021-12-14 – 2021-12-18 (×5): 40 mg via ORAL
  Filled 2021-12-14 (×5): qty 1

## 2021-12-14 MED ORDER — ALVIMOPAN 12 MG PO CAPS
12.0000 mg | ORAL_CAPSULE | Freq: Two times a day (BID) | ORAL | Status: DC
  Administered 2021-12-16: 12 mg via ORAL
  Filled 2021-12-14 (×2): qty 1

## 2021-12-14 MED ORDER — EPHEDRINE 5 MG/ML INJ
INTRAVENOUS | Status: AC
  Filled 2021-12-14: qty 5

## 2021-12-14 SURGICAL SUPPLY — 138 items
ADAPTER GOLDBERG URETERAL (ADAPTER) IMPLANT
ADPR CATH 15X14FR FL DRN BG (ADAPTER)
APL PRP STRL LF DISP 70% ISPRP (MISCELLANEOUS)
APPLIER CLIP 5 13 M/L LIGAMAX5 (MISCELLANEOUS)
APPLIER CLIP ROT 10 11.4 M/L (STAPLE)
APR CLP MED LRG 11.4X10 (STAPLE)
APR CLP MED LRG 5 ANG JAW (MISCELLANEOUS)
BAG COUNTER SPONGE SURGICOUNT (BAG) ×1 IMPLANT
BAG SPNG CNTER NS LX DISP (BAG) ×1
BAG URO CATCHER STRL LF (MISCELLANEOUS) ×1 IMPLANT
BLADE EXTENDED COATED 6.5IN (ELECTRODE) IMPLANT
CANNULA REDUC XI 12-8 STAPL (CANNULA)
CANNULA REDUCER 12-8 DVNC XI (CANNULA) IMPLANT
CATH URETL OPEN 5X70 (CATHETERS) IMPLANT
CATH URETL OPEN END 6FR 70 (CATHETERS) IMPLANT
CELLS DAT CNTRL 66122 CELL SVR (MISCELLANEOUS) IMPLANT
CHLORAPREP W/TINT 26 (MISCELLANEOUS) IMPLANT
CLIP APPLIE 5 13 M/L LIGAMAX5 (MISCELLANEOUS) IMPLANT
CLIP APPLIE ROT 10 11.4 M/L (STAPLE) IMPLANT
CLOTH BEACON ORANGE TIMEOUT ST (SAFETY) ×1 IMPLANT
COVER SURGICAL LIGHT HANDLE (MISCELLANEOUS) ×2 IMPLANT
COVER TIP SHEARS 8 DVNC (MISCELLANEOUS) ×1 IMPLANT
COVER TIP SHEARS 8MM DA VINCI (MISCELLANEOUS) ×1
DEVICE TROCAR PUNCTURE CLOSURE (ENDOMECHANICALS) IMPLANT
DRAIN CHANNEL 19F RND (DRAIN) IMPLANT
DRAPE ARM DVNC X/XI (DISPOSABLE) ×4 IMPLANT
DRAPE COLUMN DVNC XI (DISPOSABLE) ×1 IMPLANT
DRAPE DA VINCI XI ARM (DISPOSABLE) ×4
DRAPE DA VINCI XI COLUMN (DISPOSABLE) ×1
DRAPE SURG IRRIG POUCH 19X23 (DRAPES) ×1 IMPLANT
DRSG OPSITE POSTOP 4X10 (GAUZE/BANDAGES/DRESSINGS) IMPLANT
DRSG OPSITE POSTOP 4X6 (GAUZE/BANDAGES/DRESSINGS) IMPLANT
DRSG OPSITE POSTOP 4X8 (GAUZE/BANDAGES/DRESSINGS) IMPLANT
DRSG TEGADERM 2-3/8X2-3/4 SM (GAUZE/BANDAGES/DRESSINGS) ×5 IMPLANT
DRSG TEGADERM 4X4.75 (GAUZE/BANDAGES/DRESSINGS) IMPLANT
ELECT PENCIL ROCKER SW 15FT (MISCELLANEOUS) ×1 IMPLANT
ELECT REM PT RETURN 15FT ADLT (MISCELLANEOUS) ×1 IMPLANT
ENDOLOOP SUT PDS II  0 18 (SUTURE)
ENDOLOOP SUT PDS II 0 18 (SUTURE) IMPLANT
EVACUATOR SILICONE 100CC (DRAIN) IMPLANT
GAUZE SPONGE 2X2 8PLY STRL LF (GAUZE/BANDAGES/DRESSINGS) ×1 IMPLANT
GLOVE ECLIPSE 8.0 STRL XLNG CF (GLOVE) ×3 IMPLANT
GLOVE INDICATOR 8.0 STRL GRN (GLOVE) ×3 IMPLANT
GLOVE SURG LX 7.5 STRW (GLOVE) ×1
GLOVE SURG LX STRL 7.5 STRW (GLOVE) ×1 IMPLANT
GOWN SRG XL LVL 4 BRTHBL STRL (GOWNS) ×1 IMPLANT
GOWN STRL NON-REIN XL LVL4 (GOWNS) ×1
GOWN STRL REUS W/ TWL LRG LVL3 (GOWN DISPOSABLE) ×2 IMPLANT
GOWN STRL REUS W/ TWL XL LVL3 (GOWN DISPOSABLE) ×4 IMPLANT
GOWN STRL REUS W/TWL LRG LVL3 (GOWN DISPOSABLE) ×2
GOWN STRL REUS W/TWL XL LVL3 (GOWN DISPOSABLE) ×4
GRASPER SUT TROCAR 14GX15 (MISCELLANEOUS) IMPLANT
GUIDEWIRE ANG ZIPWIRE 038X150 (WIRE) IMPLANT
GUIDEWIRE STR DUAL SENSOR (WIRE) IMPLANT
HOLDER FOLEY CATH W/STRAP (MISCELLANEOUS) ×1 IMPLANT
IRRIG SUCT STRYKERFLOW 2 WTIP (MISCELLANEOUS) ×1
IRRIGATION SUCT STRKRFLW 2 WTP (MISCELLANEOUS) ×1 IMPLANT
KIT PROCEDURE DA VINCI SI (MISCELLANEOUS) ×2
KIT PROCEDURE DVNC SI (MISCELLANEOUS) ×1 IMPLANT
KIT SIGMOIDOSCOPE (SET/KITS/TRAYS/PACK) IMPLANT
KIT TURNOVER KIT A (KITS) IMPLANT
MANIFOLD NEPTUNE II (INSTRUMENTS) ×1 IMPLANT
NDL INSUFFLATION 14GA 120MM (NEEDLE) ×1 IMPLANT
NEEDLE INSUFFLATION 14GA 120MM (NEEDLE) ×1 IMPLANT
PACK CARDIOVASCULAR III (CUSTOM PROCEDURE TRAY) ×1 IMPLANT
PACK COLON (CUSTOM PROCEDURE TRAY) ×1 IMPLANT
PACK CYSTO (CUSTOM PROCEDURE TRAY) ×1 IMPLANT
PAD POSITIONING PINK XL (MISCELLANEOUS) ×1 IMPLANT
PROTECTOR NERVE ULNAR (MISCELLANEOUS) ×2 IMPLANT
RELOAD STAPLE 45 3.5 BLU DVNC (STAPLE) IMPLANT
RELOAD STAPLE 45 4.3 GRN DVNC (STAPLE) IMPLANT
RELOAD STAPLE 60 3.5 BLU DVNC (STAPLE) IMPLANT
RELOAD STAPLE 60 4.3 GRN DVNC (STAPLE) IMPLANT
RELOAD STAPLER 3.5X45 BLU DVNC (STAPLE) IMPLANT
RELOAD STAPLER 3.5X60 BLU DVNC (STAPLE) IMPLANT
RELOAD STAPLER 4.3X45 GRN DVNC (STAPLE) IMPLANT
RELOAD STAPLER 4.3X60 GRN DVNC (STAPLE) IMPLANT
RETRACTOR LONE STAR DISPOSABLE (INSTRUMENTS) IMPLANT
RETRACTOR STAY HOOK 5MM (MISCELLANEOUS) IMPLANT
RETRACTOR WND ALEXIS 18 MED (MISCELLANEOUS) IMPLANT
RTRCTR WOUND ALEXIS 18CM MED (MISCELLANEOUS)
RTRCTR WOUND ALEXIS 18CM SML (INSTRUMENTS) ×1
SAVER CELL AAL HAEMONETICS (INSTRUMENTS) IMPLANT
SCISSORS LAP 5X35 DISP (ENDOMECHANICALS) ×1 IMPLANT
SEAL CANN UNIV 5-8 DVNC XI (MISCELLANEOUS) ×3 IMPLANT
SEAL XI 5MM-8MM UNIVERSAL (MISCELLANEOUS) ×3
SEALER VESSEL DA VINCI XI (MISCELLANEOUS) ×1
SEALER VESSEL EXT DVNC XI (MISCELLANEOUS) ×1 IMPLANT
SOLUTION ELECTROLUBE (MISCELLANEOUS) ×1 IMPLANT
SPIKE FLUID TRANSFER (MISCELLANEOUS) ×1 IMPLANT
STAPLER 45 DA VINCI SURE FORM (STAPLE)
STAPLER 45 SUREFORM DVNC (STAPLE) IMPLANT
STAPLER 60 DA VINCI SURE FORM (STAPLE)
STAPLER 60 SUREFORM DVNC (STAPLE) IMPLANT
STAPLER CANNULA SEAL DVNC XI (STAPLE) ×1 IMPLANT
STAPLER CANNULA SEAL XI (STAPLE) ×1
STAPLER ECHELON POWER CIR 29 (STAPLE) IMPLANT
STAPLER ECHELON POWER CIR 31 (STAPLE) IMPLANT
STAPLER RELOAD 3.5X45 BLU DVNC (STAPLE)
STAPLER RELOAD 3.5X45 BLUE (STAPLE)
STAPLER RELOAD 3.5X60 BLU DVNC (STAPLE)
STAPLER RELOAD 3.5X60 BLUE (STAPLE)
STAPLER RELOAD 4.3X45 GREEN (STAPLE)
STAPLER RELOAD 4.3X45 GRN DVNC (STAPLE)
STAPLER RELOAD 4.3X60 GREEN (STAPLE)
STAPLER RELOAD 4.3X60 GRN DVNC (STAPLE)
STOPCOCK 4 WAY LG BORE MALE ST (IV SETS) ×2 IMPLANT
SURGILUBE 2OZ TUBE FLIPTOP (MISCELLANEOUS) IMPLANT
SUT MNCRL AB 4-0 PS2 18 (SUTURE) ×1 IMPLANT
SUT PDS AB 1 CT1 27 (SUTURE) ×2 IMPLANT
SUT PROLENE 0 CT 2 (SUTURE) IMPLANT
SUT PROLENE 2 0 KS (SUTURE) IMPLANT
SUT PROLENE 2 0 SH DA (SUTURE) IMPLANT
SUT SILK 2 0 (SUTURE)
SUT SILK 2 0 SH CR/8 (SUTURE) IMPLANT
SUT SILK 2-0 18XBRD TIE 12 (SUTURE) IMPLANT
SUT SILK 3 0 (SUTURE)
SUT SILK 3 0 SH CR/8 (SUTURE) ×1 IMPLANT
SUT SILK 3-0 18XBRD TIE 12 (SUTURE) IMPLANT
SUT V-LOC BARB 180 2/0GR6 GS22 (SUTURE) ×2
SUT VIC AB 2-0 UR6 27 (SUTURE) IMPLANT
SUT VIC AB 3-0 SH 18 (SUTURE) IMPLANT
SUT VIC AB 3-0 SH 27 (SUTURE)
SUT VIC AB 3-0 SH 27XBRD (SUTURE) IMPLANT
SUT VIC AB 3-0 SH 8-18 (SUTURE) IMPLANT
SUT VICRYL 0 UR6 27IN ABS (SUTURE) ×1 IMPLANT
SUTURE V-LC BRB 180 2/0GR6GS22 (SUTURE) IMPLANT
SYR 10ML ECCENTRIC (SYRINGE) ×1 IMPLANT
SYS LAPSCP GELPORT 120MM (MISCELLANEOUS)
SYS WOUND ALEXIS 18CM MED (MISCELLANEOUS) ×1
SYSTEM LAPSCP GELPORT 120MM (MISCELLANEOUS) IMPLANT
SYSTEM WOUND ALEXIS 18CM MED (MISCELLANEOUS) ×1 IMPLANT
TOWEL OR NON WOVEN STRL DISP B (DISPOSABLE) ×1 IMPLANT
TRAY FOLEY MTR SLVR 16FR STAT (SET/KITS/TRAYS/PACK) ×1 IMPLANT
TROCAR ADV FIXATION 5X100MM (TROCAR) ×1 IMPLANT
TUBING CONNECTING 10 (TUBING) ×3 IMPLANT
TUBING INSUFFLATION 10FT LAP (TUBING) ×1 IMPLANT
TUBING UROLOGY SET (TUBING) IMPLANT

## 2021-12-14 NOTE — Addendum Note (Signed)
Addendum  created 12/14/21 1352 by Jonna Munro, CRNA   Intraprocedure Event edited

## 2021-12-14 NOTE — Anesthesia Procedure Notes (Signed)
Procedure Name: Intubation Date/Time: 12/14/2021 7:42 AM  Performed by: Jonna Munro, CRNAPre-anesthesia Checklist: Patient identified, Emergency Drugs available, Suction available, Patient being monitored and Timeout performed Patient Re-evaluated:Patient Re-evaluated prior to induction Oxygen Delivery Method: Circle system utilized Preoxygenation: Pre-oxygenation with 100% oxygen Induction Type: IV induction Ventilation: Mask ventilation without difficulty Grade View: Grade I Tube type: Oral Tube size: 7.5 mm Number of attempts: 1 Airway Equipment and Method: Stylet Placement Confirmation: ETT inserted through vocal cords under direct vision, positive ETCO2, CO2 detector and breath sounds checked- equal and bilateral Secured at: 23 cm Tube secured with: Tape Dental Injury: Teeth and Oropharynx as per pre-operative assessment

## 2021-12-14 NOTE — Op Note (Signed)
11:30 AM  PATIENT:  Jesse [name]  71 y.o. male  Patient Care Team: [name], MD as PCP - General (Family Medicine) Curt Bears, MD as Consulting Physician (Oncology) Viona Gilmore, [facility] as Pharmacist (Pharmacist) Michael Boston, MD as Consulting Physician (General Surgery) Gatha Mayer, MD as Consulting Physician (Gastroenterology) Garner Nash, DO as Consulting Physician (Pulmonary Disease) Alexis Frock, MD as Consulting Physician (Urology)  PRE-OPERATIVE DIAGNOSIS:  GIST TUMOR OF RECTUM  POST-OPERATIVE DIAGNOSIS:   GIST TUMOR OF RECTUM LIVER NODULARITY   PROCEDURE:   -XI ROBOTIC ASSISTED VERY LOW ANTERIOR RECTOSIGMOID RESECTION -COLOANAL HANDSEWN ANASTOMOSIS -DIVERTING LOOP ILEOSTOMY -WEDGE LIVER BIOPSY -INTRAOPERATIVE ASSESSMENT OF TISSUE VASCULAR PERFUSION USING ICG (indocyanine green) IMMUNOFLUORESCENCE -TRANSVERSUS ABDOMINIS PLANE (TAP) BLOCK - BILATERAL  SURGEON:  Adin Hector, MD  ASSISTANT: Leighton Ruff, MD, FACS, FASCRS An experienced assistant was required given the standard of surgical care given the complexity of the case.  This assistant was needed for exposure, dissection, suction, tissue approximation, retraction, perception, etc.  ANESTHESIA:     General  Nerve block provided with liposomal bupivacaine (Experel) mixed with 0.25% bupivacaine as a Bilateral TAP block x 51mL each side at the level of the transverse abdominis & preperitoneal spaces along the flank at the anterior axillary line, from subcostal ridge to iliac crest under laparoscopic guidance   Local field block at port sites & extraction wound  Anorectal block  EBL:  Total I/O In: 1600 [I.V.:1500; IV Piggyback:100] Out: 540 [Urine:400; Blood:140] "see anesthesia record"  Delay start of Pharmacological VTE agent (>24hrs) due to surgical blood loss or risk of bleeding:  no  DRAINS: 19 Fr Blake drain in the pelvis   SPECIMEN:   -Rectum.  V-Loc marks distal  margin -Wedge liver biopsy  DISPOSITION OF SPECIMEN:  PATHOLOGY  COUNTS:  YES  PLAN OF CARE: Admit to inpatient   PATIENT DISPOSITION:  PACU - hemodynamically stable.  INDICATION:    Pleasant gentleman found to have lung disease followed by Dr. Earlie Server.  Treated with therapy.  Follow-up PET scan noticed lighting up in pelvis.  Underwent colonoscopy and found to have submucosal mass bulging into the distal rectum.  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  Rather large size.  Recommendation for him to start [place] to help shrink the tumor down while he completed treatment for his lung cancer.  He is now more than a year out from completing lung cancer treatment and is disease-free from that.  Tumor is beyond the 24-month nadir Gleevec treatment.  He delayed surgery a few months for personal reasons and continuing [place] but is not ready to proceed with attempt at resection.  I recommended segmental resection:  The anatomy & physiology of the digestive tract was discussed.  The pathophysiology was discussed.  Natural history risks without surgery was discussed.   I worked to give an overview of the disease and the frequent need to have multispecialty involvement.  I feel the risks of no intervention will lead to serious problems that outweigh the operative risks; therefore, I recommended a partial colectomy to remove the pathology.  Laparoscopic & open techniques were discussed.   Risks such as bleeding, infection, abscess, leak, reoperation, possible ostomy, hernia, heart attack, death, and other risks were discussed.  I noted a good likelihood this will help address the problem.   Goals of post-operative recovery were discussed as well.  We will work to minimize complications.  Educational materials on the pathology had been given in  the office.  Questions were answered.    The patient expressed understanding & wished to proceed with surgery.  OR FINDINGS:   Patient had ellipsoid  mass in densely adhered incorporated to the right anterior distal rectal wall with distal margin less than 2 cm from the anal verge.  Chicken egg size much smaller than initial presentation of mango size.  Liver rather scalloped and purple concerning for possible cirrhosis.  Covered with miliary white superficial patches 2-5 mm in size that seem very soft and superficial.  Not classic for metastatic adenocarcinoma.  Wedge liver biopsy done.    It is then an sigmoid to and anal canal handsewn anastomosis less than a centimeter from the anal verge.  He has a diverting loop ileostomy to help protect the anastomosis.    PROCEDURE:  Informed consent was confirmed.  The patient underwent general anaesthesia without difficulty.  The patient was positioned appropriately.  VTE prevention in place.  Patient underwent cystoscopy with firefly injection of both ureters by Dr. Louis Meckel urology.  Please see his separate note.  The patient's abdomen was clipped, prepped, & draped in a sterile fashion.  Surgical timeout confirmed our plan.  Peritoneal entry with a laparoscopic port was obtained using Varess spring needle entry technique in the left upper abdomen as the patient was positioned in reverse Trendelenburg.  I induced carbon dioxide insufflation.  No change in end tidal CO2 measurements.  Full symmetrical abdominal distention.  Initial port was carefully placed.  Camera inspection revealed no injury.  Extra ports were carefully placed under direct laparoscopic visualization.  Camera inspection revealed no injury.  Liver seemed somewhat nodular and dark purple with white miliary patches superficial.  Almost seemed more fatty then metastatic.  No other abnormalities seen.  I reflected the greater omentum and the upper abdomen the small bowel in the upper abdomen.  The patient was carefully positioned.  The [employer] daVinci robot was carefully docked with camera & instruments carefully placed.  Elevated the  rectosigmoid.  Could see the right ureter in its natural position and not incorporated into the mesentery.  Stayed away from that.    I scored the base of peritoneum of the medial side of the mesentery of the left colon from the ligament of Treitz to the peritoneal reflection of the mid rectum.   I elevated the sigmoid mesentery and entered into the retro-mesenteric plane. We were able to identify the left ureter and gonadal vessels. We kept those posterior within the retroperitoneum and elevated the left colon mesentery off that. I did isolated IMA pedicle but did not ligate it yet.  I continued distally and got into the avascular plane posterior to the mesorectum. This allowed me to help mobilize the rectum as well by freeing the mesorectum off the sacrum.  I mobilized the peritoneal coverings towards the peritoneal reflection on both the right and left sides of the rectum.  I stayed away from the right and left ureters.  I kept the lateral vascular pedicles to the rectum intact.  Proceed with total mesorectal excision.  Gradually exposed the rectum and freed it off its attachments to the pelvis.  Focus going down posteriorly down to the pelvic floor to the posterior sphincter complex.  Then came around to the lateral stalks.  We came through the anterior peritoneal reflection and freed the anterior rectal wall off his enlarged prostate.  I focused on the left side where a new tumor was not involved to get good mobilization left  anterolateral as well as posteriorly.  We then focused on the right anterior distal rectum and encountered the rectal wall bulging out into the anterior pelvis with a firm rubbery fibrotic mass adherent to it consistent with a gastrointestinal tumor.  We meticulously freed it off the pelvic sidewall and floor and came around.  Had good circumferential dissection.  It was extremely low with less than a centimeter margin to the pelvic floor and sphincters.  It was bulky.  I felt it was not  going to be very practical to try and do stapling so I excised at the anorectal margin just proximal to the sphincter complex circumferentially after confirming a had been distal to the area of concern.  With this I had gotten distal margin good.  I sewed this distal and closed with a V-Loc running suture to good result.  Initially it seemed like we would have enough reach but it would only go down to the peritoneal reflection.  Therefore I focused on good colon position.  Freed the left colon off the retroperitoneum.  Isolate and transected the inferior mesenteric artery pedicle as well as the colic vein.  That seemed provide good mobilization.  Began to transect the mesorectum off the sigmoid mesentery radially to region in the distal sigmoid that seem to reach down to the lower pelvis adequately.  To access vascular perfusion of tissues, we asked anesthesia use intravenous  indocyanine green (ICG) with IV flush.  I switched to the NIR fluorescence (Firefly mode) imaging window on the daVinci robot platform.  We were able to see good light green visualization of blood vessels with good vascular perfusion of tissues, confirming good tissue perfusion of tissues sigmoid colon and pelvic floor planned for anastomosis.  We found terminal ileum that was quite mobile that would reach out for diverting loop ileostomy as planned.  I felt it would be wise to do liver biopsy given the abnormal nodularity of the liver and the white patches on it.  Dr. Marcello Moores agreed.  I chose a region on the inferior edge of the left lateral sector of the liver and used a vessel sealer to get a 4 x 3 x 3 cm wedge.  Assured good hemostasis using touch point cautery.  Specimen removed out the staple port without difficulty.  We then undocked the robot.  I made a circular excision of disc dermis and subcutaneous tissues in the right paramedian region at the premarked diverting loop ileostomy location where the staple port had been.  Came  to the anterior rectus fascia transversely.  Split through the rectus muscle and transected to the posterior rectus fascia vertically and placed a small wound protector.  We able to eviscerate the rectum and confirmed that we had adequate distal margin.  Placed a pursestringer on the location anticipated.  Transected the rectum off that.  Tied the Prolene pursestring down and left long tails.  We reduce insufflation.  I scrubbed down to grab the colon.  Dr. Marcello Moores passed it down.  There was a lot of tension and would not fully reach down.  Therefore redocked the robot and repositioned and I did a more high ligation on the inferior mesenteric vein and came more proximally to the left middle colic artery pedicle and That intact.  Mobilized the colon all the way up to the splenic flexure.  That allowed better release since attention seem to be on the mesenteric side first and that seem better.  She was able to pass down  the distal end of the sigmoid colon I can get it out the anus 3 cm.  That seemed adequate for coloanal anastomosis.  Dr. Marcello Moores able to pass a 61 [place] Blake drain from the port site and I could grab it and pull it out the anus.  She secured the skin with Prolene suture.  Asencion Partridge is I evacuated and ports removed.  Loop ileostomy was brought in the right paramedian region and properly oriented to the proximal end was cephalad.  Ports of been removed and port sites closed with Monocryl with sterile dressings.  Dr. Marcello Moores was able to mature a good loop ileostomy using interrupted Vicryl sutures in a good Brooke fashion.  Good finger intubation.  2.5 cm rosebud.  Meanwhile, I did the handsewn coloanal anastomosis.  I had a 1 cm cuff of anoderm just proximal to the sphincters.  A little better on the left side than right since I had gone for distal margins on the right side.  I placed 2-0 vicryl n 4 corners anterolaterally and posterolaterally through the anorectal tissue.  I transected the Prolene and  removed the pursestring on the colon.  I then took those 4 corner sutures through the colon and tied them down.  This provided good orientation and good securing.  I returned the drain back up into the very low pelvis.  I then proceeded to complete circumferential coloanal anastomosis using intervening interrupted sutures with 2-0 Vicryl to good result.  Finger easily intubated.  I inspected and noticed 1 slight opening right posterior that it an additional Vicryl suture.  With that I had a good circumferential interrupted Vicryl coloanal handsewn anastomosis.  Mucosa looked viable.    Patient is being extubated go to recovery room. I discussed postop care with the patient & his spouse in detail the office & in the holding area. Instructions are written.I discussed operative findings, updated the patient's status, discussed probable steps to recovery, and gave postoperative recommendations to the patient's spouse, [name].  Recommendations were made.  Questions were answered.  She expressed understanding & appreciation.   Adin Hector, M.D., F.A.C.S. Gastrointestinal and Minimally Invasive Surgery [facility] [address], [place], [place] [address] [phone] Main / Paging

## 2021-12-14 NOTE — Consult Note (Signed)
[place] Nurse ostomy follow up Patient was marked on 12/05/21 by [facility] M. [place] for today's surgery. Val Riles, RN, MSN, CWOCN, CNS-BC, pager [phone]

## 2021-12-14 NOTE — H&P (Signed)
12/14/2021     REFERRING PHYSICIAN: Eilleen Kempf, MD  Patient Care Team: [name], MD as PCP - General (Family Medicine) Eilleen Kempf, MD (Medical Oncology) Johney Maine, Adrian Saran, MD as Consulting Provider (General Surgery) Gatha Mayer, MD (Gastroenterology) Alexis Frock, MD (Urology)  PROVIDER: Hollace Kinnier, MD  [facility] MRN: [id number] DOB: 1950/12/08  SUBJECTIVE   Chief Complaint: Follow-up   History of Present Illness: Jesse [name] is a 71 y.o. male who is seen today  as an office consultation at the request of Dr. Julien Nordmann  for evaluation of Follow-up .  Patient returns. Lung cancer survivor status post lung resection and post adjuvant therapy completed a year ago. Part of work-up was noted to have a mass in his pelvis. Biopsy showed gastrointestinal stromal tumor. Followed by Dr. Earlie Server with medical oncology. He has completed his surgical and post adjuvant treatment for his lung cancer. No evidence of recurrence a year out. Therefore, his medical oncologist wished to refer back to me to discuss dealing with the pelvic tumor. We have had him on Gleevec in the hopes of shrinking down the pelvic GIST tumor since he finished lung chemoTx last year..  Patient comes today with his wife. He notes he has an extra bowel movement on the Gleevec but moves his bowels about twice a day. Appetite has been good. He is remain abstinent from smoking for the past 2 years. He can walk 1/2-hour without difficulty and is relatively active. Not on blood thinners. No bleeding. No diabetes.    CURRENT THERAPY: 3) Neoadjuvant treatment with imatinib 400 mg p.o. daily for the GIST tumor of the pelvic area. First dose started July 18, 2020. Status post 12 months of treatment.   PRIOR THERAPY:  1) status post left upper lobectomy with lymph node dissection on December 16, 2019 under the care of Dr. Kipp Brood.  The tumor size measured 0.9 cm but there was involvement of  the level 10 L and 11 L.  2) Adjuvant systemic chemotherapy with cisplatin 75 mg/M2 and Alimta 500 mg/M2 every 3 weeks. First dose February 04, 2020. Status post 4 cycles.    PRIOR NOTE 06/2020 The patient is an anxious 71 year old male.  Found to have a lung mass.  Follow-up chest x-ray noted the mass doubled in size.  Biopsy concerning for lung cancer.  Underwent robotic left upper lobectomy. Stage IIB (T1 a, N1, M0) non-small cell lung cancer, adenocarcinoma diagnosed in June 2021.  He had CAT scan for his workup was found to have a mass down in his pelvis between his rectum and prostate.  Underwent flexible sigmoidoscopy to confirm rectal wall mass.  He was sent to urology.  Mass noted.  Biopsies done.  Consistent with gastrointestinal stromal tumor.  GIST.  Surgical consultation requested.  Patient is undergoing post-adjuvant chemotherapy by medical oncology, Dr. Inda Merlin.  Patient recalls being told by medical oncology as well as urology that he most likely benefit from some neoadjuvant [place] before considering surgery.  Patient does not he had an episode of bad constipation and needed a [place] mag citrate definably clean himself out.  He normally does not need to take anything and does not.  Usually moves his bowels every morning.  However he has got more constipated.  He feels like he felt some pressure something abnormal since April.  Again had a colonoscopy 3 years ago for polyps and had no rectal mass at that time.  He normally would walk several miles  without difficulty but since his lung surgery is doing about half mile walks now.  He smoked for 60 years but quit with the diagnosis of his lung cancer and has remained abstinent.  No abdominal surgery.  No personal nor family history of GI/colon cancer, inflammatory bowel disease, irritable bowel syndrome, allergy such as Celiac Sprue, dietary/dairy problems, colitis, ulcers nor gastritis.  No recent sick contacts/gastroenteritis.  No travel  outside the country.  No changes in diet.  No dysphagia to solids or liquids.  No significant heartburn or reflux.  No melena, hematemesis, coffee ground emesis.  No evidence of prior gastric/peptic ulceration.     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   cholecalciferol (VITAMIN D3) 1000 unit tablet Take 1 tablet by mouth once daily   ergocalciferol, vitamin D2, 1,250 mcg (50,000 unit) capsule Take 50,000 Units by mouth every 7 (seven) days   omeprazole (PRILOSEC) 40 MG DR capsule TAKE 1 CAPSULE (40 MG TOTAL) BY MOUTH DAILY.   No current facility-administered medications on file prior to visit.   Family History  Problem Relation Age of Onset   Obesity Brother   Hyperlipidemia (Elevated cholesterol) Brother    Social History   Tobacco Use  Smoking Status Former   Types: Cigarettes   Quit date: 09/2019   Years since quitting: 1.9  Smokeless Tobacco Never    Social History   Socioeconomic History   Marital status: Married  Tobacco Use   Smoking status: Former  Types: Cigarettes  Quit date: 09/2019  Years since quitting: 1.9   Smokeless tobacco: Never  Vaping Use   Vaping Use: Never used  Substance and Sexual Activity   Alcohol use: Yes   Drug use: Yes  Comment:  marijuana   ############################################################  Review of Systems: A complete review of systems (ROS) was obtained from the patient. I have reviewed this information and discussed as appropriate with the patient. See HPI as well for other pertinent ROS.  Constitutional: No fevers, chills, sweats. Weight stable Eyes: No vision changes, No discharge HENT: No sore throats, nasal drainage Lymph: No neck swelling, No bruising easily Pulmonary: No cough, productive sputum. Abstinent from smoking. No shortness of breath or wheezing CV: No orthopnea, PND . No exertional chest/neck/shoulder/arm pain. Patient can walk 30 minutes without difficulty.   GI: No personal nor family history of GI/colon cancer, inflammatory bowel disease, irritable bowel syndrome, allergy such as Celiac Sprue, dietary/dairy problems, colitis, ulcers nor gastritis. No recent sick contacts/gastroenteritis. No travel outside the country. No changes in diet.  Renal: No UTIs, No hematuria Genital: No drainage, bleeding, masses Musculoskeletal: No severe joint pain. Good ROM major joints Skin: No sores or lesions Heme/Lymph: No easy bleeding. No swollen lymph nodes Neuro: No active seizures.  No facial droop Psych: No hallucinations. No agitation  OBJECTIVE   Vitals:  08/21/21 1151  BP: 112/68  Pulse: 96  Temp: 36.8 C (98.2 F)  SpO2: 98%  Weight: 78.4 kg (172 lb 12.8 oz)  Height: 175.3 cm (5\' 9" )   Body mass index is 25.52 kg/m.  PHYSICAL EXAM:  Constitutional: Not cachectic. Hygeine adequate. Vitals signs as above.  Eyes: Pupils reactive, normal extraocular movements. Sclera nonicteric Neuro: CN II-XII intact. No major focal sensory defects. No major motor deficits. Lymph: No head/neck/groin lymphadenopathy Psych: No severe agitation. No severe anxiety. Judgment & insight Adequate, Oriented x4, HENT: Normocephalic, Mucus membranes moist. No thrush. Hearing: adequate Neck: Supple, No  tracheal deviation. No obvious thyromegaly Chest: No pain to chest wall compression. Good respiratory excursion. No audible wheezing CV: Pulses intact. regular. No major extremity edema Ext: No obvious deformity or contracture. Edema: Not present. No cyanosis Skin: No major subcutaneous nodules. Warm and dry Musculoskeletal: Severe joint rigidity not present. No obvious clubbing. No digital petechiae. Mobility: no assist device moving easily without restrictions  Abdomen: Flat Soft. Nondistended. Nontender. Hernia: Present at: umbilicus, size 0.5R1.0YT. Diastasis recti: Mild supraumbilical midline. No hepatomegaly. No splenomegaly.  Genital/Pelvic: Inguinal hernia: Not present. Inguinal lymph nodes: without lymphadenopathy nor hidradenitis.   Rectal: ##################################  Perianal skin Clean with good hygiene  Pruritis ani: Not present Anal fissure: Not present Perirectal abscess/fistula Not present External hemorrhoids Not present Sphincter tone Normal   Other significant findings: 4-5 cm ofrom the anal verge is a 4x3x3cm anterior rectal wall partially mobile mass in the anterior midline. Covering up the proximal prostate. Consistent with his gastrointestinal stromal tumor. Markedly smaller than last year.  Patient examined with patient in decubitus position .  ###################################    ###################################################################  Labs, Imaging and Diagnostic Testing:  Located in 'Care Everywhere' section of Epic EMR chart  PRIOR [facility] NOTES:  Located in [place]' section of Epic EMR chart  SHIQUAN MATHIEU Appointment: 07/04/2020 11:30 AM Location: [facility] Patient #: [id number] DOB: 09/03/1950 Married / Language: Cleophus Molt / Race: White Male  History of Present Illness Adin Hector, MD; 07/04/2020 11:37 AM) The patient is a 71 year old male who presents with a colorectal polyp. Note for  "Colorectal polyp": ` ` ` Patient sent for surgical consultation at the request of Phebe Colla, [facility]  Chief Complaint: Rectal mass consistent with gastrointestinal stromal tumor. ` ` The patient is an anxious 71 year old male.  Found to have a lung mass.  Follow-up chest x-ray noted the mass doubled in size.  Biopsy concerning for lung cancer.  Underwent robotic left upper lobectomy. Stage IIB (T1 a, N1, M0) non-small cell lung cancer, adenocarcinoma diagnosed in June 2021.  He had CAT scan for his workup was found to have a mass down in his pelvis between his rectum and prostate.  Underwent flexible sigmoidoscopy to confirm rectal wall mass.  He was sent to urology.  Mass noted.  Biopsies done.  Consistent with gastrointestinal stromal tumor.  GIST.  Surgical consultation requested.  Patient is undergoing post-adjuvant chemotherapy by medical oncology, Dr. Inda Merlin.  Patient recalls being told by medical oncology as well as urology that he most likely benefit from some neoadjuvant [place] before considering surgery.  Patient does not he had an episode of bad constipation and needed a [place] mag citrate definably clean himself out.  He normally does not need to take anything and does not.  Usually moves his bowels every morning.  However he has got more constipated.  He feels like he felt some pressure something abnormal since April.  Again had a colonoscopy 3 years ago for polyps and had no rectal mass at that time.  He normally would walk several miles without difficulty but since his lung surgery is doing about half mile walks now.  He smoked for 60 years but quit with the diagnosis of his lung cancer and has remained abstinent.  No abdominal surgery.  No personal nor family history of GI/colon cancer, inflammatory bowel disease, irritable bowel syndrome, allergy such as Celiac Sprue, dietary/dairy problems, colitis, ulcers nor gastritis.  No recent sick contacts/gastroenteritis.  No travel  outside the country.  No changes in diet.  No dysphagia to solids or liquids.  No significant heartburn or reflux.  No melena, hematemesis, coffee ground emesis.  No evidence of prior gastric/peptic ulceration.     (Review of systems as stated in this history (HPI) or in the review of systems.  Otherwise all other 12 point ROS are negative) ` ` ###########################################`  This patient encounter took 50 minutes today to perform the following: obtain history, perform exam, review outside records, interpret tests & imaging, counsel the patient on their diagnosis; and, document this encounter, including findings & plan in the electronic health record (EHR).  Problem List/Past Medical Adin Hector, MD; 07/04/2020 11:37 AM) NON-SMALL CELL CARCINOMA OF LUNG, STAGE 2, LEFT (C34.92)   GASTROINTESTINAL STROMAL TUMOR (GIST) OF RECTUM (C49.A5)   Anterior mid/distal rectal mass on CT scan 10/09/2019 Rectal mass confirmed by flexible sigmoidoscopy Silvano Rusk 10/21/18/21. Underwhelming colonoscopy 2018 Transrectal ultrasound biopsies by [facility] Dr. Phebe Colla showing gastrointestinal stromal tumor September 2021 HISTORY OF ADENOMATOUS POLYP OF COLON (Z86.010)   CHRONIC CONSTIPATION (K59.09)    Past Surgical History Adin Hector, MD; 07/04/2020 11:37 AM) Colon Polyp Removal - Colonoscopy   Colon Polyp Removal - Open   Knee Surgery   Bilateral. Lung Surgery   Left. Oral Surgery   Tonsillectomy   Vasectomy    Diagnostic Studies History Adin Hector, MD; 07/04/2020 11:37 AM) Colonoscopy   1-5 years ago  Allergies [facility] Teressa Senter, CMA; 07/04/2020 11:19 AM) No Known Drug Allergies   [02/29/2020]: Allergies Reconciled    Medication History (Chanel Teressa Senter, CMA; 07/04/2020 11:19 AM) amLODIPine Besylate  (10MG  Tablet, Oral) Active. Atorvastatin Calcium  (40MG  Tablet, Oral) Active. Dexamethasone  (4MG  Tablet, Oral) Active. Folic Acid  (1MG  Tablet, Oral) Active. Magnesium Oxide   (400MG  Tablet, Oral) Active. Omeprazole  (40MG  Capsule DR, Oral) Active. Prochlorperazine Maleate  (10MG  Tablet, Oral) Active. traMADol HCl  (50MG  Tablet, Oral) Active. Medications Reconciled   Social History Adin Hector, MD; 07/04/2020 11:37 AM) Alcohol use   Moderate alcohol use. Caffeine use   Coffee, Tea. Illicit drug use   Uses monthly. Tobacco use   Former smoker.  Family History Adin Hector, MD; 07/04/2020 11:37 AM) Alcohol Abuse   Father, Son. Heart Disease   Brother. Hypertension   Brother.  Other Problems Adin Hector, MD; 07/04/2020 11:37 AM) Arthritis   Back Pain   Bladder Problems   Cancer   Chronic Obstructive Lung Disease   Gastroesophageal Reflux Disease   High blood pressure   Hypercholesterolemia   Lung Cancer    Review of Systems Adin Hector, MD; 07/04/2020 11:37 AM) General Not Present- Appetite Loss, Chills, Fatigue, Fever, Night Sweats, Weight Gain and Weight Loss. Skin Not Present- Change in Wart/Mole, Dryness, Hives, Jaundice, New Lesions, Non-Healing Wounds,  Rash and Ulcer. HEENT Present- Wears glasses/contact lenses. Not Present- Earache, Hearing Loss, Hoarseness, Nose Bleed, Oral Ulcers, Ringing in the Ears, Seasonal Allergies, Sinus Pain, Sore Throat, Visual Disturbances and Yellow Eyes. Respiratory Not Present- Bloody sputum, Chronic Cough, Difficulty Breathing, Snoring and Wheezing. Breast Not Present- Breast Mass, Breast Pain, Nipple Discharge and Skin Changes. Cardiovascular Not Present- Chest Pain, Difficulty Breathing Lying Down, Leg Cramps, Palpitations, Rapid Heart Rate, Shortness of Breath and Swelling of Extremities. Gastrointestinal Present- Change in Bowel Habits, Constipation and Rectal Pain. Not Present- Abdominal Pain, Bloating, Bloody Stool, Chronic diarrhea, Difficulty Swallowing, Excessive gas, Gets full quickly at meals, Hemorrhoids, Indigestion, Nausea and Vomiting. Male Genitourinary Present- Painful Urination. Not  Present- Blood in Urine, Change in Urinary Stream, Frequency, Impotence, Nocturia, Urgency and Urine Leakage. Musculoskeletal Present- Back Pain. Not Present- Joint Pain, Joint Stiffness, Muscle Pain, Muscle Weakness and Swelling of Extremities. Neurological Not Present- Decreased Memory, Fainting, Headaches, Numbness, Seizures, Tingling, Tremor, Trouble walking and Weakness. Psychiatric Not Present- Anxiety, Bipolar, Change in Sleep Pattern, Depression, Fearful and Frequent crying. Endocrine Not Present- Cold Intolerance, Excessive Hunger, Hair Changes, Heat Intolerance and New Diabetes. Hematology Not Present- Blood Thinners, Easy Bruising, Excessive bleeding, Gland problems, HIV and Persistent Infections.  Vitals (Chanel Nolan CMA; 07/04/2020 11:20 AM) 07/04/2020 11:19 AM Weight: 173.5 lb   Height: 69 in  Body Surface Area: 1.94 m   Body Mass Index: 25.62 kg/m   Temp.: 97.9 F    Pulse: 109 (Regular)     Physical Exam Adin Hector MD; 07/04/2020 11:38 AM) General Mental Status - Alert. General Appearance - Not in acute distress, Not Sickly. Orientation - Oriented X3. Hydration - Well hydrated. Voice - Normal.  Integumentary Global Assessment Upon inspection and palpation of skin surfaces of the - Axillae: non-tender, no inflammation or ulceration, no drainage. and Distribution of scalp and body hair is normal. General Characteristics Temperature - normal warmth is noted.  Head and Neck Head - normocephalic, atraumatic with no lesions or palpable masses. Face Global Assessment - atraumatic, no absence of expression. Neck Global Assessment - no abnormal movements, no bruit auscultated on the right, no bruit auscultated on the left, no decreased range of motion, non-tender. Trachea - midline. Thyroid Gland Characteristics - non-tender.  Eye Eyeball - Left - Extraocular movements intact, No Nystagmus - Left. Eyeball - Right - Extraocular movements intact, No Nystagmus -  Right. Cornea - Left - No Hazy - Left. Cornea - Right - No Hazy - Right. Sclera/Conjunctiva - Left - No scleral icterus, No Discharge - Left. Sclera/Conjunctiva - Right - No scleral icterus, No Discharge - Right. Pupil - Left - Direct reaction to light normal. Pupil - Right - Direct reaction to light normal.  ENMT Ears Pinna - Left - no drainage observed, no generalized tenderness observed. Pinna - Right - no drainage observed, no generalized tenderness observed. Nose and Sinuses External Inspection of the Nose - no destructive lesion observed. Inspection of the nares - Left - quiet respiration. Inspection of the nares - Right - quiet respiration. Mouth and Throat Lips - Upper Lip - no fissures observed, no pallor noted. Lower Lip - no fissures observed, no pallor noted. Nasopharynx - no discharge present. Oral Cavity/Oropharynx - Tongue - no dryness observed. Oral Mucosa - no cyanosis observed. Hypopharynx - no evidence of airway distress observed. Note:  Mild hoarseness  Chest and Lung Exam Inspection Movements - Normal and Symmetrical. Accessory muscles - No use of accessory muscles in breathing. Palpation Palpation  of the chest reveals - Non-tender. Auscultation Breath sounds - Normal and Clear.  Cardiovascular Auscultation Rhythm - Regular. Murmurs & Other Heart Sounds - Auscultation of the heart reveals - No Murmurs and No Systolic Clicks.  Abdomen Inspection Inspection of the abdomen reveals - No Visible peristalsis and No Abnormal pulsations. Umbilicus - No Bleeding, No Urine drainage. Palpation/Percussion Palpation and Percussion of the abdomen reveal - Soft, Non Tender, No Rebound tenderness, No Rigidity (guarding) and No Cutaneous hyperesthesia. Note:  Abdomen soft.  Nontender.  Thin abdominal wall with moderate diastases recti.  Umbilicus very flat. Not distended.  No umbilical or incisional hernias.  No guarding.  Male Genitourinary Sexual Maturity Tanner 5 - Adult  hair pattern and Adult penile size and shape. Note:  No inguinal hernias.  Normal external genitalia.  Epididymi, testes, and spermatic cords normal without any masses.  Rectal Note:  Anterior rectal wall mass 3 cm from anal verge fixed anterior rectal wall and prostate.  Soft and smooth and balloon dilated.  About size of a racquetball filling most of the mid rectal cavity.  6x6x5cm approximately. Seems a little more inferior and slightly larger now  Normal sphincter tone.  No fissure fissure or abscess.  Some whiteheads perianally but no major acne.  No hidradenitis  Peripheral Vascular Upper Extremity Inspection - Left - No Cyanotic nailbeds - Left, Not Ischemic. Inspection - Right - No Cyanotic nailbeds - Right, Not Ischemic.  Neurologic Neurologic evaluation reveals  - normal attention span and ability to concentrate, able to name objects and repeat phrases. Appropriate fund of knowledge , normal sensation and normal coordination. Mental Status Affect - not angry, not paranoid. Cranial Nerves - Normal Bilaterally. Gait - Normal.  Neuropsychiatric Mental status exam performed with findings of - able to articulate well with normal speech/language, rate, volume and coherence, thought content normal with ability to perform basic computations and apply abstract reasoning and no evidence of hallucinations, delusions, obsessions or homicidal/suicidal ideation.  Musculoskeletal Global Assessment Spine, Ribs and Pelvis - no instability, subluxation or laxity. Right Upper Extremity - no instability, subluxation or laxity.  Lymphatic Head & Neck General Head & Neck Lymphatics: Bilateral - Description - No Localized lymphadenopathy. Axillary General Axillary Region: Bilateral - Description - No Localized lymphadenopathy. Femoral & Inguinal Generalized Femoral & Inguinal Lymphatics: Left - Description - No Localized lymphadenopathy. Right - Description - No Localized  lymphadenopathy.  Assessment & Plan Adin Hector MD; 07/04/2020 1:34 PM) GASTROINTESTINAL STROMAL TUMOR (GIST) OF RECTUM (C49.A5) Story: Anterior mid/distal rectal mass on CT scan 10/09/2019  Rectal mass confirmed by flexible sigmoidoscopy Silvano Rusk 10/21/18/21. Underwhelming colonoscopy 2018  Transrectal ultrasound biopsies by [facility] Dr. Phebe Colla showing gastrointestinal stromal tumor September 2021 Impression: Anterior rectal wall mass with biopsy consistent with gastrointestinal stromal tumor overlying prostate. Distal margin felt 3 cm from anal verge - closer  At some point this requires resection.  Given its rather larger size and growth rate, I think this would be best managed with neoadjuvant oral Gleevec. Most studies show a nadir plateauing around 28-34 weeks.  When I had last seen him, I had recommended starting [place] and follow-up with me in about 3-4 months. Time to resection most likely due around the 7-9 month period.  Treatment plan complicated by his recovery from lung surgery for a small but stage II lymph node positive NSCLS lung cancer. He finished post-adjuvant chemotherapy for that.  Ideally he should start his [facility] as soon as possible since this  seems larger than his CAT scan 4 months ago, central cavitation most likely reflection of rapidity of growth. No evidence of any mass on colonoscopy in 2018, three years ago. [place] not started yet - will add to GI Tumor Board, get MRI pelvis & d/w GI med onc. d/w Dr Marcello Moores who agrees with plan. Current Plans Call back in 1 week with progress Pt Education - Gastrointestinal stromal tumor: discussed with patient and provided information. NON-SMALL CELL CARCINOMA OF LUNG, STAGE 2, LEFT (C34.92) Current Plans Please follow up in the office with the surgeon that performed your operation for continued postoperative management Instructed the patient to make an appointment for a follow-up office visit after seeing  the recommended specialist. HISTORY OF ADENOMATOUS POLYP OF COLON (Z86.010) Impression: Defer to his gastroenterologist on plan for colonoscopy. Suspect would be of benefit to have follow-up colonoscopy one year after transanal excision Current Plans Pt Education - Polyps in the Colon and Rectum (Colonic and Rectal Polyps): colonic polyps CHRONIC CONSTIPATION (K59.09) Impression: Some constipation like symptoms most likely related to his chemotherapy and his moderately large rectal mass  I strongly recommend a fiber supplement. 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 - bisacodyL (DULCOLAX) 5 mg EC tablet; Take 4 tablets (20 mg total) by mouth once daily as needed for Constipation for up to 1 dose - metroNIDAZOLE (FLAGYL) 500 MG tablet; Take 2 tablets (1,000 mg total) by mouth 3 (three) times daily for 3 doses SEE BOWEL PREP INSTRUCTIONS: Take 2 tablets at 2pm, 3pm, and 10pm the day prior to your colon operation. - neomycin 500 mg tablet; Take 2 tablets (1,000 mg total) by mouth 3 (three) times daily for 3 doses SEE BOWEL PREP INSTRUCTIONS: Take 2 tablets at 2pm, 3pm, and 10pm the day prior to your colon operation.    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 Decreased size on neoadjuvant oral Gleevec x13 months. Lung cancer survivor with no evidence recurrence 1 year after  completing resection and post adjuvant chemotherapy treatment   I think he is hitting the nadir of the GIST tumor size and this is the opportunity to proceed with resection. We will plan to do robotic low anterior rectosigmoid resection with coloanal anastomosis. If the mass is rather pedunculated and the higher rectum and the resulting anastomosis rests higher than 5 cm from the anal verge, then we will avoid diverting loop ileostomy. However I strongly suspect I am going to need temporary diverting ileostomy since I can feel a mass relatively low. Plus he has been on chemotherapy for the past 2 years despite his otherwise decent exercise tolerance.  The anatomy & physiology of the digestive tract was discussed. The pathophysiology of the rectal pathology was discussed. Natural history risks without surgery was discussed. I worked to give an overview of the disease and the frequent need to have multispecialty involvement. I feel the risks of no intervention will lead to serious problems  that outweigh the operative risks; therefore, I recommended a partial proctocolectomy to remove the pathology. Minimally Invasive (Robotic/Laparoscopic) & open techniques were discussed. We will work to preserve anal & pelvic floor function without sacrificing cure.  Risks such as bleeding, infection, abscess, leak, reoperation, possible temporary or permanent ostomy, hernia, stroke, heart attack, death, and other risks were discussed. I noted a good likelihood this will help address the problem. Goals of post-operative recovery were discussed as well. We will work to minimize complications. Educational information was available as well. Questions were answered. The patient expresses understanding & wishes to proceed with surgery. His wife agrees as well.  We will ask for preoperative wound ostomy marking for probable need for diverting loop ileostomy.  Given his history of hydronephrosis and other issues with a low  surgery near his prostate, would like urology to do cystoscopy and firefly injections of bladder and ureters to be safe and thorough. Dr. Tresa Moore and [facility] have been involved with this patient in the past for many years.  Pt delayed until the summer - he is now ready  Adin Hector, MD, FACS, MASCRS Esophageal, Gastrointestinal & Colorectal Surgery Robotic and Minimally Invasive Surgery  [facility] [place]. [address] [place], [place] [address] [phone] Fax [phone] Main  CONTACT INFORMATION:  Weekday (9AM-5PM): Call [facility] main office at [phone]  Weeknight (5PM-9AM) or Weekend/Holiday: Check [email or web address] (password " [email or web address]") for General Surgery [facility] coverage  (Please, do not use SecureChat as it is not reliable communication to reach operating surgeons for immediate patient care)

## 2021-12-14 NOTE — Transfer of Care (Signed)
Immediate Anesthesia Transfer of Care Note  Patient: Jesse [name]  Procedure(s) Performed: XI ROBOTIC ASSISTED LOWER ANTERIOR ULTRA LOW RECTOSIGMOID RESECTION, COLOANAL HAND SEWN ANASTOMOSIS, AND BILATERAL TAP BLOCK DIVERTING ILEOSTOMY CYSTOSCOPY with FIREFLY INJECTION  Patient Location: PACU  Anesthesia Type:General  Level of Consciousness: awake, alert  and patient cooperative  Airway & Oxygen Therapy: Patient Spontanous Breathing and Patient connected to face mask oxygen  Post-op Assessment: Report given to RN, Post -op Vital signs reviewed and stable and Patient moving all extremities X 4  Post vital signs: Reviewed and stable  Last Vitals:  Vitals Value Taken Time  BP 120/65 12/14/21 1145  Temp    Pulse 87 12/14/21 1150  Resp 21 12/14/21 1150  SpO2 100 % 12/14/21 1150  Vitals shown include unvalidated device data.  Last Pain:  Vitals:   12/14/21 0540  TempSrc: Oral         Complications: No notable events documented.

## 2021-12-14 NOTE — Consult Note (Signed)
[facility] Nurse ostomy follow up Patient and spouse in [facility] room 1319. Stoma type/location: RUQ loop ileostomy Patient just arrived to room from PACU. I provided the [facility] education folder and acquired 2 one piece flat pouches Kellie Simmering [phone]) and 2 barrier rings, Kellie Simmering [phone] and placed at the bedside. I explained a WOC nurse will see and begin teaching tomorrow. There is a one piece, flat pouch around the red, moist, budded stoma at the time of my visit.  Val Riles, RN, MSN, CWOCN, CNS-BC, pager [phone]

## 2021-12-14 NOTE — Op Note (Signed)
Preoperative diagnosis:  Pelvic Abscess   Diverticulitis Postoperative diagnosis:  Same   Procedure: Cystoscopy Instillation of ureteral firefly constrast   Surgeon: Ardis Hughs, MD   Anesthesia: General   Complications: None   Intraoperative findings: large median lobe, difficulty sliding catheter into left ureter.   EBL: Minimal   Specimens: None   Indication:  Jesse [name]  is a 71 y.o.  patient with a rectal GIST tumor.  Dr. Johney Maine requested cystoscopy and instillation of firefly contrast to help facilitate the dissection of the sigmoid colon.  After reviewing the management options for treatment, he elected to proceed with the above surgical procedure(s). We have discussed the potential benefits and risks of the procedure, side effects of the proposed treatment, the likelihood of the patient achieving the goals of the procedure, and any potential problems that might occur during the procedure or recuperation. Informed consent has been obtained.   Description of procedure:   The patient was taken to the operating room and general anesthesia was induced.  The patient was placed in the dorsal lithotomy position, prepped and draped in the usual sterile fashion, and preoperative antibiotics were administered. A preoperative time-out was performed.    A 21 French 30 degree cystoscope was gently passed through the patient's urethra into the bladder.  The bladder was subsequently emptied and then filled slowly up performing a 360 degrees cystoscopic evaluation.  This demonstrated orthotopic ureteral orifices, normal bladder mucosa with an area of heaped mucosa and bullous edema in the dome -  evidence of colovesical fistula without mucosal abnormality.   I then advanced a 5 [place] open-ended ureteral catheter into the patient's left ureteral orifice and  I then advanced the catheter up into the proximal ureter and then slowly pulled back and injected 7.23ml of the firefly  contrast.  Subsequently turned my attention to the patient's right ureteral orifice and performed a similar task.   I then  placed a 16 [place] Foley.    The surgery was then turned over to Dr. Johney Maine for facilitation of the remainder of the case.

## 2021-12-14 NOTE — Anesthesia Postprocedure Evaluation (Signed)
Anesthesia Post Note  Patient: Jesse [name]  Procedure(s) Performed: XI ROBOTIC ASSISTED LOWER ANTERIOR ULTRA LOW RECTOSIGMOID RESECTION, COLOANAL HAND SEWN ANASTOMOSIS, AND BILATERAL TAP BLOCK DIVERTING ILEOSTOMY CYSTOSCOPY with FIREFLY INJECTION     Patient location during evaluation: PACU Anesthesia Type: General Level of consciousness: awake and alert Pain management: pain level controlled Vital Signs Assessment: post-procedure vital signs reviewed and stable Respiratory status: spontaneous breathing, nonlabored ventilation, respiratory function stable and patient connected to nasal cannula oxygen Cardiovascular status: blood pressure returned to baseline and stable Postop Assessment: no apparent nausea or vomiting Anesthetic complications: no   No notable events documented.  Last Vitals:  Vitals:   12/14/21 1240 12/14/21 1245  BP:  (!) 142/72  Pulse: 78 67  Resp: 13 (!) 0  Temp:    SpO2: (!) 86% 97%    Last Pain:  Vitals:   12/14/21 1230  TempSrc:   PainSc: 0-No pain                 Rindy Kollman,W. EDMOND

## 2021-12-14 NOTE — Progress Notes (Signed)
Pacu RN Report to floor given  Gave report to  [employer]. Room: 1319   Discussed surgery, meds given in OR and Pacu, VS, IV fluids given, EBL, urine output, pain and other pertinent information. Also discussed if pt had any family or friends here or belongings with them.   Discussed ileostomy, jp drain and 3 lapsites. VSS, 2L [place] dips down when asleep, no pain, Tap block in place. Wife updated.   Pt exits my care.

## 2021-12-14 NOTE — Discharge Instructions (Addendum)
SURGERY: POST OP INSTRUCTIONS (Surgery for small bowel obstruction, colon resection, etc)   ######################################################################  EAT Gradually transition to a high fiber diet with a fiber supplement over the next few days after discharge  WALK Walk an hour a day.  Control your pain to do that.    CONTROL PAIN Control pain so that you can walk, sleep, tolerate sneezing/coughing, go up/down stairs.  HAVE A BOWEL MOVEMENT DAILY Keep your bowels regular to avoid problems.  OK to try a laxative to override constipation.  OK to use an antidairrheal to slow down diarrhea.  Call if not better after 2 tries  CALL IF YOU HAVE PROBLEMS/CONCERNS Call if you are still struggling despite following these instructions. Call if you have concerns not answered by these instructions  ######################################################################   DIET Follow a light diet the first few days at home.  Start with a bland diet such as soups, liquids, starchy foods, low fat foods, etc.  If you feel full, bloated, or constipated, stay on a ful liquid or pureed/blenderized diet for a few days until you feel better and no longer constipated. Be sure to drink plenty of fluids every day to avoid getting dehydrated (feeling dizzy, not urinating, etc.). Gradually add a fiber supplement to your diet over the next week.  Gradually get back to a regular solid diet.  Avoid fast food or heavy meals the first week as you are more likely to get nauseated. It is expected for your digestive tract to need a few months to get back to normal.  It is common for your bowel movements and stools to be irregular.  You will have occasional bloating and cramping that should eventually fade away.  Until you are eating solid food normally, off all pain medications, and back to regular activities; your bowels will not be normal. Focus on eating a low-fat, high fiber diet the rest of your life  (See Getting to [place], below).  CARE of your INCISION or WOUND  It is good for closed incisions and even open wounds to be washed every day.  Shower every day.  Short baths are fine.  Wash the incisions and wounds clean with soap & water.    You may leave closed incisions open to air if it is dry.   You may cover the incision with clean gauze & replace it after your daily shower for comfort.  TEGADERM:  You have clear gauze band-aid dressings over your closed incision(s).  Remove the dressings 3 days after surgery.    If you have an open wound with a wound vac, see wound vac care instructions.    ACTIVITIES as tolerated Start light daily activities --- self-care, walking, climbing stairs-- beginning the day after surgery.  Gradually increase activities as tolerated.  Control your pain to be active.  Stop when you are tired.  Ideally, walk several times a day, eventually an hour a day.   Most people are back to most day-to-day activities in a few weeks.  It takes 4-8 weeks to get back to unrestricted, intense activity. If you can walk 30 minutes without difficulty, it is safe to try more intense activity such as jogging, treadmill, bicycling, low-impact aerobics, swimming, etc. Save the most intensive and strenuous activity for last (Usually 4-8 weeks after surgery) such as sit-ups, heavy lifting, contact sports, etc.  Refrain from any intense heavy lifting or straining until you are off narcotics for pain control.  You will have off days, but  things should improve week-by-week. DO NOT PUSH THROUGH PAIN.  Let pain be your guide: If it hurts to do something, don't do it.  Pain is your body warning you to avoid that activity for another week until the pain goes down. You may drive when you are no longer taking narcotic prescription pain medication, you can comfortably wear a seatbelt, and you can safely make sudden turns/stops to protect yourself without hesitating due to pain. You may  have sexual intercourse when it is comfortable. If it hurts to do something, stop.  MEDICATIONS Take your usually prescribed home medications unless otherwise directed.   Blood thinners:  Usually you can restart any strong blood thinners after the second postoperative day.  It is OK to take aspirin right away.     If you are on strong blood thinners (warfarin/Coumadin, Plavix, Xerelto, Eliquis, Pradaxa, etc), discuss with your surgeon, medicine PCP, and/or cardiologist for instructions on when to restart the blood thinner & if blood monitoring is needed (PT/INR blood check, etc).     PAIN CONTROL Pain after surgery or related to activity is often due to strain/injury to muscle, tendon, nerves and/or incisions.  This pain is usually short-term and will improve in a few months.  To help speed the process of healing and to get back to regular activity more quickly, DO THE FOLLOWING THINGS TOGETHER: Increase activity gradually.  DO NOT PUSH THROUGH PAIN Use Ice and/or Heat Try Gentle Massage and/or Stretching Take over the counter pain medication Take Narcotic prescription pain medication for more severe pain  Good pain control = faster recovery.  It is better to take more medicine to be more active than to stay in bed all day to avoid medications.  Increase activity gradually Avoid heavy lifting at first, then increase to lifting as tolerated over the next 6 weeks. Do not "push through" the pain.  Listen to your body and avoid positions and maneuvers than reproduce the pain.  Wait a few days before trying something more intense Walking an hour a day is encouraged to help your body recover faster and more safely.  Start slowly and stop when getting sore.  If you can walk 30 minutes without stopping or pain, you can try more intense activity (running, jogging, aerobics, cycling, swimming, treadmill, sex, sports, weightlifting, etc.) Remember: If it hurts to do it, then don't do it! Use Ice and/or  Heat You will have swelling and bruising around the incisions.  This will take several weeks to resolve. Ice packs or heating pads (6-8 times a day, 30-60 minutes at a time) will help sooth soreness & bruising. Some people prefer to use ice alone, heat alone, or alternate between ice & heat.  Experiment and see what works best for you.  Consider trying ice for the first few days to help decrease swelling and bruising; then, switch to heat to help relax sore spots and speed recovery. Shower every day.  Short baths are fine.  It feels good!  Keep the incisions and wounds clean with soap & water.   Try Gentle Massage and/or Stretching Massage at the area of pain many times a day Stop if you feel pain - do not overdo it Take over the counter pain medication This helps the muscle and nerve tissues become less irritable and calm down faster Choose ONE of the following over-the-counter anti-inflammatory medications: Acetaminophen 500mg tabs (Tylenol) 1-2 pills with every meal and just before bedtime (avoid if you have liver problems or   if you have acetaminophen in you narcotic prescription) Naproxen 220mg tabs (ex. Aleve, Naprosyn) 1-2 pills twice a day (avoid if you have kidney, stomach, IBD, or bleeding problems) Ibuprofen 200mg tabs (ex. Advil, Motrin) 3-4 pills with every meal and just before bedtime (avoid if you have kidney, stomach, IBD, or bleeding problems) Take with food/snack several times a day as directed for at least 2 weeks to help keep pain / soreness down & more manageable. Take Narcotic prescription pain medication for more severe pain A prescription for strong pain control is often given to you upon discharge (for example: oxycodone/Percocet, hydrocodone/Norco/Vicodin, or tramadol/Ultram) Take your pain medication as prescribed. Be mindful that most narcotic prescriptions contain Tylenol (acetaminophen) as well - avoid taking too much Tylenol. If you are having problems/concerns with  the prescription medicine (does not control pain, nausea, vomiting, rash, itching, etc.), please call us [phone] to see if we need to switch you to a different pain medicine that will work better for you and/or control your side effects better. If you need a refill on your pain medication, you must call the office before 4 pm and on weekdays only.  By federal law, prescriptions for narcotics cannot be called into a pharmacy.  They must be filled out on paper & picked up from our office by the patient or authorized caretaker.  Prescriptions cannot be filled after 4 pm nor on weekends.    WHEN TO CALL US [phone] Severe uncontrolled or worsening pain  Fever over 101 F (38.5 C) Concerns with the incision: Worsening pain, redness, rash/hives, swelling, bleeding, or drainage Reactions / problems with new medications (itching, rash, hives, nausea, etc.) Nausea and/or vomiting Difficulty urinating Difficulty breathing Worsening fatigue, dizziness, lightheadedness, blurred vision Other concerns If you are not getting better after two weeks or are noticing you are getting worse, contact our office [phone] for further advice.  We may need to adjust your medications, re-evaluate you in the office, send you to the emergency room, or see what other things we can do to help. The clinic staff is available to answer your questions during regular business hours (8:30am-5pm).  Please don't hesitate to call and ask to speak to one of our nurses for clinical concerns.    A surgeon from [facility] is always on call at the hospitals 24 hours/day If you have a medical emergency, go to the nearest emergency room or call 911.  FOLLOW UP in our office One the day of your discharge from the hospital (or the next business weekday), please call [facility] to set up or confirm an appointment to see your surgeon in the office for a follow-up appointment.  Usually it is 2-3 weeks  after your surgery.   If you have skin staples at your incision(s), let the office know so we can set up a time in the office for the nurse to remove them (usually around 10 days after surgery). Make sure that you call for appointments the day of discharge (or the next business weekday) from the hospital to ensure a convenient appointment time. IF YOU HAVE DISABILITY OR FAMILY LEAVE FORMS, BRING THEM TO THE OFFICE FOR PROCESSING.  DO NOT GIVE THEM TO YOUR DOCTOR.  [facility] [address], [address], [place], [place]  [address] ? [phone] - Main [phone] - Toll Free,  [phone] - Fax [email or web address]    GETTING TO GOOD BOWEL HEALTH. It is expected for your   digestive tract to need a few months to get back to normal.  It is common for your bowel movements and stools to be irregular.  You will have occasional bloating and cramping that should eventually fade away.  Until you are eating solid food normally, off all pain medications, and back to regular activities; your bowels will not be normal.   Avoiding constipation The goal: ONE SOFT BOWEL MOVEMENT A DAY!    Drink plenty of fluids.  Choose water first. TAKE A FIBER SUPPLEMENT EVERY DAY THE REST OF YOUR LIFE During your first week back home, gradually add back a fiber supplement every day Experiment which form you can tolerate.   There are many forms such as powders, tablets, wafers, gummies, etc Psyllium bran (Metamucil), methylcellulose (Citrucel), Miralax or Glycolax, Benefiber, Flax Seed.  Adjust the dose week-by-week (1/2 dose/day to 6 doses a day) until you are moving your bowels 1-2 times a day.  Cut back the dose or try a different fiber product if it is giving you problems such as diarrhea or bloating. Sometimes a laxative is needed to help jump-start bowels if constipated until the fiber supplement can help regulate your bowels.  If you are tolerating eating & you are farting, it  is okay to try a gentle laxative such as double dose MiraLax, prune juice, or Milk of Magnesia.  Avoid using laxatives too often. Stool softeners can sometimes help counteract the constipating effects of narcotic pain medicines.  It can also cause diarrhea, so avoid using for too long. If you are still constipated despite taking fiber daily, eating solids, and a few doses of laxatives, call our office. Controlling diarrhea Try drinking liquids and eating bland foods for a few days to avoid stressing your intestines further. Avoid dairy products (especially milk & ice cream) for a short time.  The intestines often can lose the ability to digest lactose when stressed. Avoid foods that cause gassiness or bloating.  Typical foods include beans and other legumes, cabbage, broccoli, and dairy foods.  Avoid greasy, spicy, fast foods.  Every person has some sensitivity to other foods, so listen to your body and avoid those foods that trigger problems for you. Probiotics (such as active yogurt, Align, etc) may help repopulate the intestines and colon with normal bacteria and calm down a sensitive digestive tract Adding a fiber supplement gradually can help thicken stools by absorbing excess fluid and retrain the intestines to act more normally.  Slowly increase the dose over a few weeks.  Too much fiber too soon can backfire and cause cramping & bloating. It is okay to try and slow down diarrhea with a few doses of antidiarrheal medicines.   Bismuth subsalicylate (ex. Kayopectate, Pepto Bismol) for a few doses can help control diarrhea.  Avoid if pregnant.   Loperamide (Imodium) can slow down diarrhea.  Start with one tablet (2mg) first.  Avoid if you are having fevers or severe pain.  ILEOSTOMY PATIENTS WILL HAVE CHRONIC DIARRHEA since their colon is not in use.    Drink plenty of liquids.  You will need to drink even more glasses of water/liquid a day to avoid getting dehydrated. Record output from your  ileostomy.  Expect to empty the bag every 3-4 hours at first.  Most people with a permanent ileostomy empty their bag 4-6 times at the least.   Use antidiarrheal medicine (especially Imodium) several times a day to avoid getting dehydrated.  Start with a dose at bedtime & breakfast.    Adjust up or down as needed.  Increase antidiarrheal medications as directed to avoid emptying the bag more than 8 times a day (every 3 hours). Work with your wound ostomy nurse to learn care for your ostomy.  See ostomy care instructions. TROUBLESHOOTING IRREGULAR BOWELS 1) Start with a soft & bland diet. No spicy, greasy, or fried foods.  2) Avoid gluten/wheat or dairy products from diet to see if symptoms improve. 3) Miralax 17gm or flax seed mixed in Max. water or juice-daily. May use 2-4 times a day as needed. 4) Gas-X, Phazyme, etc. as needed for gas & bloating.  5) Prilosec (omeprazole) over-the-counter as needed 6)  Consider probiotics (Align, Activa, etc) to help calm the bowels down  Call your doctor if you are getting worse or not getting better.  Sometimes further testing (cultures, endoscopy, X-ray studies, CT scans, bloodwork, etc.) may be needed to help diagnose and treat the cause of the diarrhea. [facility], [place], [place], [place], [place]  [address] [phone] - Main.    [phone]  - Toll Free.   [phone] - Fax [email or web address]  ##################################################################  #######################################################  Ostomy Support Information  You've heard that people get along just fine with only one of their eyes, or one of their lungs, or one of their kidneys. But you also know that you have only one intestine and only one bladder, and that leaves you feeling awfully empty, both physically and emotionally: You think no other people go around without part of their intestine with the ends of their  intestines sticking out through their abdominal walls.   YOU ARE NOT ALONE.  There are nearly three quarters of a million people in the [place] who have an ostomy; people who have had surgery to remove all or part of their colons or bladders.   There is even a national association, the [place] [employer] [place] with over 350 local affiliated support groups that are organized by volunteers who provide peer support and counseling. [place] has a toll free telephone num-ber, [phone] and an educational, interactive website, www.ostomy.org   An ostomy is an opening in the belly (abdominal wall) made by surgery. Ostomates are people who have had this procedure. The opening (stoma) allows the kidney or bowel to grdischarge waste. An external pouch covers the stoma to collect waste. Pouches are are a simple bag and are odor free. Different companies have disposable or reusable pouches to fit one's lifestyle. An ostomy can either be temporary or permanent.   THERE ARE THREE MAIN TYPES OF OSTOMIES Colostomy. A colostomy is a surgically created opening in the large intestine (colon). Ileostomy. An ileostomy is a surgically created opening in the small intestine. Urostomy. A urostomy is a surgically created opening to divert urine away from the bladder.  OSTOMY Care  The following guidelines will make care of your colostomy easier. Keep this information close by for quick reference.  Helpful DIET hints Eat a well-balanced diet including vegetables and fresh fruits. Eat on a regular schedule.  Drink at least 6 to 8 glasses of fluids daily. Eat slowly in a relaxed atmosphere. Chew your food thoroughly. Avoid chewing gum, smoking, and drinking from a straw. This will help decrease the amount of air you swallow, which may help reduce gas. Eating yogurt or drinking buttermilk may help reduce gas.  To control gas at night, do not eat after 8 p.m. This will give your bowel time to quiet down before you  go to bed.  If gas is a problem, you can purchase Beano. Sprinkle Beano on the first bite of food before eating to reduce gas. It has no flavor and should not change the taste of your food. You can buy Beano over the counter at your local drugstore.  Foods like fish, onions, garlic, broccoli, asparagus, and cabbage produce odor. Although your pouch is odor-proof, if you eat these foods you may notice a stronger odor when emptying your pouch. If this is a concern, you may want to limit these foods in your diet.  If you have an ileostomy, you will have chronic diarrhea & need to drink more liquids to avoid getting dehydrated.  Consider antidiarrheal medicine like imodium (loperamide) or Lomotil to help slow down bowel movements / diarrhea into your ileostomy bag.  GETTING TO GOOD BOWEL HEALTH WITH AN ILEOSTOMY    With the colon bypassed & not in use, you will have small bowel diarrhea.   It is important to thicken & slow your bowel movements down.   The goal: 4-6 small BOWEL MOVEMENTS A DAY It is important to drink plenty of liquids to avoid getting dehydrated  CONTROLLING ILEOSTOMY DIARRHEA  TAKE A FIBER SUPPLEMENT (FiberCon or Benefiner soluble fiber) twice a day - to thicken stools by absorbing excess fluid and retrain the intestines to act more normally.  Slowly increase the dose over a few weeks.  Too much fiber too soon can backfire and cause cramping & bloating.  TAKE AN IRON SUPPLEMENT twice a day to naturally constipate your bowels.  Usually ferrous sulfate $RemoveBeforeD'325mg'bYDvYmxwAkIlxr$  twice a day)  TAKE ANTI-DIARRHEAL MEDICINES: Loperamide (Imodium) can slow down diarrhea.  Start with two tablets (= $RemoveBe'4mg'hqUFpSfrl$ ) first and then try one tablet every 6 hours.  Can go up to 2 pills four times day (8 pills of $Remove'2mg'FzwYpvm$  max) Avoid if you are having fevers or severe pain.  If you are not better or start feeling worse, stop all medicines and call your doctor for advice LoMotil (Diphenoxylate / Atropine) is another medicine that  can constipate & slow down bowel moevements Pepto Bismol (bismuth) can gently thicken bowels as well  If diarrhea is worse,: drink plenty of liquids and try simpler foods for a few days to avoid stressing your intestines further. Avoid dairy products (especially milk & ice cream) for a short time.  The intestines often can lose the ability to digest lactose when stressed. Avoid foods that cause gassiness or bloating.  Typical foods include beans and other legumes, cabbage, broccoli, and dairy foods.  Every person has some sensitivity to other foods, so listen to our body and avoid those foods that trigger problems for you.Call your doctor if you are getting worse or not better.  Sometimes further testing (cultures, endoscopy, X-ray studies, bloodwork, etc) may be needed to help diagnose and treat the cause of the diarrhea. Take extra anti-diarrheal medicines (maximum is 8 pills of $Remove'2mg'StvEIFU$  loperamide a day)   Tips for POUCHING an OSTOMY   Changing Your Pouch The best time to change your pouch is in the morning, before eating or drinking anything. Your stoma can function at any time, but it will function more after eating or drinking.   Applying the pouching system  Place all your equipment close at hand before removing your pouch.  Wash your hands.  Stand or sit in front of a mirror. Use the position that works best for you. Remember that you must keep the skin around the stoma  wrinkle-free for a good seal.  Gently remove the used pouch (1-piece system) or the pouch and old wafer (2-piece system). Empty the pouch into the toilet. Save the closure clip to use again.  Wash the stoma itself and the skin around the stoma. Your stoma may bleed a little when being washed. This is normal. Rinse and pat dry. You may use a wash cloth or soft paper towels (like Bounty), mild soap (like Dial, Safeguard, or [place]), and water. Avoid soaps that contain perfumes or lotions.  For a new pouch (1-piece system)  or a new wafer (2-piece system), measure your stoma using the stoma guide in each box of supplies.  Trace the shape of your stoma onto the back of the new pouch or the back of the new wafer. Cut out the opening. Remove the paper backing and set it aside.  Optional: Apply a skin barrier powder to surrounding skin if it is irritated (bare or weeping), and dust off the excess. Optional: Apply a skin-prep wipe (such as Skin Prep or All-Kare) to the skin around the stoma, and let it dry. Do not apply this solution if the skin is irritated (red, tender, or broken) or if you have shaved around the stoma. Optional: Apply a skin barrier paste (such as Stomahesive, Coloplast, or Premium) around the opening cut in the back of the pouch or wafer. Allow it to dry for 30 to 60 seconds.  Hold the pouch (1-piece system) or wafer (2-piece system) with the sticky side toward your body. Make sure the skin around the stoma is wrinkle-free. Center the opening on the stoma, then press firmly to your abdomen (Fig. 4). Look in the mirror to check if you are placing the pouch, or wafer, in the right position. For a 2-piece system, snap the pouch onto the wafer. Make sure it snaps into place securely.  Place your hand over the stoma and the pouch or wafer for about 30 seconds. The heat from your hand can help the pouch or wafer stick to your skin.  Add deodorant (such as Super Banish or Nullo) to your pouch. Other options include food extracts such as vanilla oil and peppermint extract. Add about 10 drops of the deodorant to the pouch. Then apply the closure clamp. Note: Do not use toxic  chemicals or commercial cleaning agents in your pouch. These substances may harm the stoma.  Optional: For extra seal, apply tape to all 4 sides around the pouch or wafer, as if you were framing a picture. You may use any brand of medical adhesive tape. Change your pouch every 5 to 7 days. Change it immediately if a leak occurs.  Wash your  hands afterwards.  If you are wearing a 2-piece system, you may use 2 new pouches per week and alternate them. Rinse the pouch with mild soap and warm water and hang it to dry for the next day. Apply the fresh pouch. Alternate the 2 pouches like this for a week. After a week, change the wafer and begin with 2 new pouches. Place the old pouches in a plastic bag, and put them in the trash.   LIVING WITH AN OSTOMY  Emptying Your Pouch Empty your pouch when it is one-third full (of urine, stool, and/or gas). If you wait until your pouch is fuller than this, it will be more difficult to empty and more noticeable. When you empty your pouch, either put toilet paper in the toilet bowl first, or flush the  toilet while you empty the pouch. This will reduce splashing. You can empty the pouch between your legs or to one side while sitting, or while standing or stooping. If you have a 2-piece system, you can snap off the pouch to empty it. Remember that your stoma may function during this time. If you wish to rinse your pouch after you empty it, a [place] baster can be helpful. When using a baster, squirt water up into the pouch through the opening at the bottom. With a 2-piece system, you can snap off the pouch to rinse it. After rinsing  your pouch, empty it into the toilet. When rinsing your pouch at home, put a few granules of Dreft soap in the rinse water. This helps lubricate and freshen your pouch. The inside of your pouch can be sprayed with non-stick cooking oil ([name] spray). This may help reduce stool sticking to the inside of the pouch.  Bathing You may shower or bathe with your pouch on or off. Remember that your stoma may function during this time.  The materials you use to wash your stoma and the skin around it should be clean, but they do not need to be sterile.  Wearing Your Pouch During hot weather, or if you perspire a lot in general, wear a cover over your pouch. This may prevent a rash on  your skin under the pouch. Pouch covers are sold at ostomy supply stores. Wear the pouch inside your underwear for better support. Watch your weight. Any gain or loss of 10 to 15 pounds or more can change the way your pouch fits.  Going Away From Home A collapsible cup (like those that come in travel kits) or a soft plastic squirt bottle with a pull-up top (like a travel bottle for shampoo) can be used for rinsing your pouch when you are away from home. Tilt the opening of the pouch at an upward angle when using a cup to rinse.  Carry wet wipes or extra tissues to use in public bathrooms.  Carry an extra pouching system with you at all times.  Never keep ostomy supplies in the glove compartment of your car. Extreme heat or cold can damage the skin barriers and adhesive wafers on the pouch.  When you travel, carry your ostomy supplies with you at all times. Keep them within easy reach. Do not pack ostomy supplies in baggage that will be checked or otherwise separated from you, because your baggage might be lost. If you're traveling out of the country, it is helpful to have a letter stating that you are carrying ostomy supplies as a medical necessity.  If you need ostomy supplies while traveling, look in the yellow pages of the telephone book under "Surgical Supplies." Or call the local ostomy organization to find out where supplies are available.  Do not let your ostomy supplies get low. Always order new pouches before you use the last one.  Reducing Odor Limit foods such as broccoli, cabbage, onions, fish, and garlic in your diet to help reduce odor. Each time you empty your pouch, carefully clean the opening of the pouch, both inside and outside, with toilet paper. Rinse your pouch 1 or 2 times daily after you empty it (see directions for emptying your pouch and going away from home). Add deodorant (such as Super Banish or Nullo) to your pouch. Use air deodorizers in your bathroom. Do not  add aspirin to your pouch. Even though aspirin can help prevent odor, it  could cause ulcers on your stoma.  When to call the doctor Call the doctor if you have any of the following symptoms: Purple, black, or white stoma Severe cramps lasting more than 6 hours Severe watery discharge from the stoma lasting more than 6 hours No output from the colostomy for 3 days Excessive bleeding from your stoma Swelling of your stoma to more than 1/2-inch larger than usual Pulling inward of your stoma below skin level Severe skin irritation or deep ulcers Bulging or other changes in your abdomen  When to call your ostomy nurse Call your ostomy/enterostomal therapy (WOCN) nurse if any of the following occurs: Frequent leaking of your pouching system Change in size or appearance of your stoma, causing discomfort or problems with your pouch Skin rash or rawness Weight gain or loss that causes problems with your pouch     FREQUENTLY ASKED QUESTIONS   Why haven't you met any of these folks who have an ostomy?  Well, maybe you have! You just did not recognize them because an ostomy doesn't show. It can be kept secret if you wish. Why, maybe some of your best friends, office associates or neighbors have an ostomy ... you never can tell. People facing ostomy surgery have many quality-of-life questions like: Will you bulge? Smell? Make noises? Will you feel waste leaving your body? Will you be a captive of the toilet? Will you starve? Be a social outcast? Get/stay married? Have babies? Easily bathe, go swimming, bend over?  OK, let's look at what you can expect:   Will you bulge?  Remember, without part of the intestine or bladder, and its contents, you should have a flatter tummy than before. You can expect to wear, with little exception, what you wore before surgery ... and this in-cludes tight clothing and bathing suits.   Will you smell?  Today, thanks to modern odor proof pouching systems, you can  walk into an ostomy support group meeting and not smell anything that is foul or offensive. And, for those with an ileostomy or colostomy who are concerned about odor when emptying their pouch, there are in-pouch deodorants that can be used to eliminate any waste odors that may exist.   Will you make noises?  Everyone produces gas, especially if they are an air-swallower. But intestinal sounds that occur from time to time are no differ-ent than a gurgling tummy, and quite often your clothing will muffle any sounds.   Will you feel the waste discharges?  For those with a colostomy or ileostomy there might be a slight pressure when waste leaves your body, but understand that the intestines have no nerve endings, so there will be no unpleasant sensations. Those with a urostomy will probably be unaware of any kidney drainage.   Will you be a captive of the toilet?  Immediately post-op you will spend more time in the bathroom than you will after your body recovers from surgery. Every person is different, but on average those with an ileostomy or urostomy may empty their pouches 4 to 6 times a day; a little  less if you have a colostomy. The average wear time between pouch system changes is 3 to 5 days and the changing process should take less than 30 minutes.   Will I need to be on a special diet? Most people return to their normal diet when they have recovered from surgery. Be sure to chew your food well, eat a well-balanced diet and drink plenty of fluids. If  you experience problems with a certain food, wait a couple of weeks and try it again.  Will there be odor and noises? Pouching systems are designed to be odor-proof or odor-resistant. There are deodorants that can be used in the pouch. Medications are also available to help reduce odor. Limit gas-producing foods and carbonated beverages. You will experience less gas and fewer noises as you heal from surgery.  How much time will it take to care  for my ostomy? At first, you may spend a lot of time learning about your ostomy and how to take care of it. As you become more comfortable and skilled at changing the pouching system, it will take very little time to care for it.   Will I be able to return to work? People with ostomies can perform most jobs. As soon as you have healed from surgery, you should be able to return to work. Heavy lifting (more than 10 pounds) may be discouraged.   What about intimacy? Sexual relationships and intimacy are important and fulfilling aspects of your life. They should continue after ostomy surgery. Intimacy-related concerns should be discussed openly between you and your partner.   Can I wear regular clothing? You do not need to wear special clothing. Ostomy pouches are fairly flat and barely noticeable. Elastic undergarments will not hurt the stoma or prevent the ostomy from functioning.   Can I participate in sports? An ostomy should not limit your involvement in sports. Many people with ostomies are runners, skiers, swimmers or participate in other active lifestyles. Talk with your caregiver first before doing heavy physical activity.  Will you starve?  Not if you follow doctor's orders at each stage of your post-op adjustment. There is no such thing as an "ostomy diet". Some people with an ostomy will be able to eat and tolerate anything; others may find diffi-culty with some foods. Each person is an individual and must determine, by trial, what is best for them. A good practice for all is to drink plenty of water.   Will you be a social outcast?  Have you met anyone who has an ostomy and is a social outcast? Why should you be the first? Only your attitude and self image will effect how you are treated. No confi-dent person is an Occupational psychologist.    PROFESSIONAL HELP   Resources are available if you need help or have questions about your ostomy.   Specially trained nurses called Wound, Ostomy Continence  Nurses (WOCN) are available for consultation in most major medical centers.  Consider getting an ostomy consult at an outpatient ostomy clinic.   [facility] has an [facility] run by an Programmer, systems at the [place].  [phone]. [facility] can help set up an appointment   The [employer] (UOA) is a group made up of many local chapters throughout the [place]. These local groups hold meetings and provide support to prospective and existing ostomates. They sponsor educational events and have qualified visitors to make personal or telephone visits. Contact the UOA for the chapter nearest you and for other educational publications.  More detailed information can be found in Colostomy Guide, a publication of the [employer] (UOA). Contact UOA at 1-782-277-7340 or visit their web site at [email or web address].[email or web address]/. The website contains links to other sites, suppliers and resources.  [facility] Start Services: Start at the website to enlist for support.  Your Wound Ostomy (WOCN) nurse may have started this  process. [email or web address]://[email or web address].[email or web address]/securestart Secure Start services are designed to support people as they live their lives with an ostomy or neurogenic bladder. Enrolling is easy and at no cost to the patient. We realize that each person's needs and life journey are different. Through Secure Start services, we want to help people live their life, their way.  #######################################################

## 2021-12-15 ENCOUNTER — Encounter (HOSPITAL_COMMUNITY): Payer: Self-pay | Admitting: Surgery

## 2021-12-15 DIAGNOSIS — Z932 Ileostomy status: Secondary | ICD-10-CM

## 2021-12-15 LAB — CBC
HCT: 28.1 % — ABNORMAL LOW (ref 39.0–52.0)
Hemoglobin: 9.5 g/dL — ABNORMAL LOW (ref 13.0–17.0)
MCH: 34.9 pg — ABNORMAL HIGH (ref 26.0–34.0)
MCHC: 33.8 g/dL (ref 30.0–36.0)
MCV: 103.3 fL — ABNORMAL HIGH (ref 80.0–100.0)
Platelets: 123 10*3/uL — ABNORMAL LOW (ref 150–400)
RBC: 2.72 MIL/uL — ABNORMAL LOW (ref 4.22–5.81)
RDW: 13.6 % (ref 11.5–15.5)
WBC: 7.8 10*3/uL (ref 4.0–10.5)
nRBC: 0 % (ref 0.0–0.2)

## 2021-12-15 LAB — BASIC METABOLIC PANEL
Anion gap: 5 (ref 5–15)
BUN: 12 mg/dL (ref 8–23)
CO2: 26 mmol/L (ref 22–32)
Calcium: 7.8 mg/dL — ABNORMAL LOW (ref 8.9–10.3)
Chloride: 104 mmol/L (ref 98–111)
Creatinine, Ser: 0.74 mg/dL (ref 0.61–1.24)
GFR, Estimated: >60 mL/min (ref >60)
Glucose, Bld: 112 mg/dL — ABNORMAL HIGH (ref 70–99)
Potassium: 3.2 mmol/L — ABNORMAL LOW (ref 3.5–5.1)
Sodium: 135 mmol/L (ref 135–145)

## 2021-12-15 LAB — MAGNESIUM: Magnesium: 1.3 mg/dL — ABNORMAL LOW (ref 1.7–2.4)

## 2021-12-15 MED ORDER — POTASSIUM CHLORIDE 10 MEQ/100ML IV SOLN
10.0000 meq | INTRAVENOUS | Status: AC
  Administered 2021-12-15 (×4): 10 meq via INTRAVENOUS
  Filled 2021-12-15 (×4): qty 100

## 2021-12-15 MED ORDER — POTASSIUM CHLORIDE CRYS ER 20 MEQ PO TBCR
40.0000 meq | EXTENDED_RELEASE_TABLET | Freq: Every day | ORAL | Status: AC
  Administered 2021-12-15 – 2021-12-17 (×3): 40 meq via ORAL
  Filled 2021-12-15 (×3): qty 2

## 2021-12-15 MED ORDER — FERROUS SULFATE 325 (65 FE) MG PO TABS
325.0000 mg | ORAL_TABLET | Freq: Two times a day (BID) | ORAL | Status: DC
  Administered 2021-12-15 – 2021-12-18 (×6): 325 mg via ORAL
  Filled 2021-12-15 (×6): qty 1

## 2021-12-15 MED ORDER — VITAMIN D 25 MCG (1000 UNIT) PO TABS
1000.0000 [IU] | ORAL_TABLET | Freq: Every day | ORAL | Status: DC
  Administered 2021-12-15 – 2021-12-18 (×4): 1000 [IU] via ORAL
  Filled 2021-12-15 (×4): qty 1

## 2021-12-15 MED ORDER — IMATINIB MESYLATE 400 MG PO TABS: 400.0000 mg | ORAL_TABLET | Freq: Every day | ORAL | Status: DC

## 2021-12-15 MED ORDER — MAGNESIUM SULFATE 4 GM/100ML IV SOLN
4.0000 g | Freq: Once | INTRAVENOUS | Status: AC
  Administered 2021-12-15: 4 g via INTRAVENOUS
  Filled 2021-12-15: qty 100

## 2021-12-15 MED ORDER — LOPERAMIDE HCL 2 MG PO CAPS: 2.0000 mg | ORAL_CAPSULE | Freq: Four times a day (QID) | ORAL | Status: DC | PRN

## 2021-12-15 NOTE — Progress Notes (Signed)
Mobility Specialist - Progress Note    12/15/21 1035  Mobility  HOB Elevated/Bed Position Self regulated  Activity Ambulated independently in hallway  Range of Motion/Exercises Active  Level of Assistance Independent after set-up  Assistive Device None  Distance Ambulated (ft) 450 ft  Activity Response Tolerated well  $Mobility charge 1 Mobility   Pt was found in bed and agreeable to mobilize. Had no complaints and at EOS returned to bed with all necessities in reach.  Ferd Hibbs Mobility Specialist

## 2021-12-15 NOTE — Consult Note (Signed)
[place] Nurse ostomy consult note Stoma type/location: RLQ, loop ileostomy  Stomal assessment/size: 1 1/2", pink, moist, prolapsed  Peristomal assessment: right lateral abdomen MARSI (medical adhesive related skin irritation) 2cm x 1.5cm x 0.1cm and at medial aspect of tape border of the skin barrier from 3-6 o'clock related to skin trauma from pouch adhesive and tension post laparoscopic  surgery  Treatment options for stomal/peristomal skin: silicone foam to the areas of MARSI, 2" barrier ring around stoma to protect skin  Output liquid green, high volumes  Ostomy pouching: 1pc.flat with 2" skin barrier ring  Education provided:  Explained role of ostomy nurse and creation of stoma  Explained stoma characteristics (budded, flush, color, texture, care) Demonstrated pouch change (cutting new skin barrier, measuring stoma, cleaning peristomal skin and stoma, use of barrier ring) Allowed patient to cut new skin barrier portion of pouch Education on emptying when 1/3 to 1/2 full and how to empty Demonstrated use of wick to clean spout  Discussed bathing, diet, gas, medication use, constipation, diarrhea, dehydration  Discussed food blockage     Enrolled patient in [place] program: Yes  Planned teaching visit with patient and his wife for 1030 am 9/4 Patient will have [place] for support at DC 4 flat one pc pouches and 4 barrier rings in the patients room along with educational materials   [place] Nurse will follow along with you for continued support with ostomy teaching and care [place] MSN, [place], [place], [place], [place]

## 2021-12-15 NOTE — Progress Notes (Signed)
Jesse [name] [id number] [birth year]  CARE TEAM:  PCP: [name], MD  Outpatient Care Team: Patient Care Team: [name], MD as PCP - General (Family Medicine) Curt Bears, MD as Consulting Physician (Oncology) Viona Gilmore, [facility] as Pharmacist (Pharmacist) Michael Boston, MD as Consulting Physician (General Surgery) Gatha Mayer, MD as Consulting Physician (Gastroenterology) Garner Nash, DO as Consulting Physician (Pulmonary Disease) Alexis Frock, MD as Consulting Physician (Urology)  Inpatient Treatment Team: Treatment Team: Attending Provider: Michael Boston, MD; [place] Nurse: Meryle Ready, RN; Registered Nurse: Lucillie Garfinkel, RN; Registered Nurse: Aura Dials, RN; Utilization Review: Barbara Cower, RN; Pharmacist: Adrian Saran, [facility]   Problem List:   Principal Problem:   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 [facility]) Active Problems:   GERD   Atrophic gastritis   Osteoarthritis   NEOPLASM, MALIGNANT, BLADDER, HX OF   Essential hypertension   Adenocarcinoma of left lung, stage 2 ([place])   GIST (gastrointestinal stromal tumor), malignant ([place])   Ileostomy in place ([place])   1 Day Post-Op    12/14/2021  POST-OPERATIVE DIAGNOSIS:   GIST TUMOR OF RECTUM LIVER NODULARITY    PROCEDURE:   -XI ROBOTIC ASSISTED VERY LOW ANTERIOR RECTOSIGMOID RESECTION -COLOANAL HANDSEWN ANASTOMOSIS -DIVERTING LOOP ILEOSTOMY -WEDGE LIVER BIOPSY -INTRAOPERATIVE ASSESSMENT OF TISSUE VASCULAR PERFUSION USING ICG (indocyanine green) IMMUNOFLUORESCENCE -TRANSVERSUS ABDOMINIS PLANE (TAP) BLOCK - BILATERAL   SURGEON:  Adin Hector, MD  OR FINDINGS:    Patient had ellipsoid mass in densely adhered incorporated to the right anterior distal rectal wall with distal margin less than 2 cm from the anal verge.  Chicken egg size much smaller than initial presentation of mango size.   Liver rather scalloped and purple concerning for  possible cirrhosis.  Covered with miliary white superficial patches 2-5 mm in size that seem very soft and superficial.  Not classic for metastatic adenocarcinoma.  Wedge liver biopsy done.     It is then an sigmoid to and anal canal handsewn anastomosis less than a centimeter from the anal verge.  He has a diverting loop ileostomy to help protect the anastomosis.   12/14/2021  Procedure: Cystoscopy Instillation of ureteral firefly constrast   Surgeon: Ardis Hughs, MD   Anesthesia: General   Complications: None   Intraoperative findings: large median lobe, difficulty sliding catheter into left ureter.     Assessment  Recovering  [facility] Stay = 1 days)  Plan:  -Follow-up on pathology -Stop IV fluids with aggressive backup as needed. -Foley catheter out.  I&O catheter replaced as needed. -Enhance recovery protocol.  Advance diet as tolerated. -Patient high risk for high output ileostomy given the need for fecal diversion with a loop ileostomy.  We will start with soluble fiber twice daily.  If has high-volume, and iron sulfate 325 p.o. twice daily and Imodium 2 mg twice daily.  Gradually increase Imodium until less than a liter output.  We will see. -Okay to go back on his [place]. -Hypertension control. -GERD control. -VTE prophylaxis- SCDs, etc -mobilize as tolerated to help recovery  Disposition:  Disposition:  The patient is from: Home  Anticipate discharge to:  Home with Home Health  Anticipated Date of Discharge is:  September 4,2023    Barriers to discharge:  Pending Clinical improvement (more likely than not)  Patient currently is NOT MEDICALLY STABLE for discharge from the hospital from a surgery standpoint.      I reviewed nursing notes, last  24 h vitals and pain scores, last 48 h intake and output, last 24 h labs and trends, and last 24 h imaging results. I have reviewed this patient's available data, including medical history, events of  note, test results, etc as part of my evaluation.  A significant portion of that time was spent in counseling.  Care during the described time interval was provided by me.  This care required moderate level of medical decision making.  12/15/2021    Subjective: (Chief complaint)  Patient with minimal soreness.  Tolerated clear liquids.  Nursing just outside room.  Objective:  Vital signs:  Vitals:   12/14/21 1815 12/14/21 2201 12/15/21 0159 12/15/21 0528  BP: (!) 146/81 129/65 134/71 128/70  Pulse: 96 85 83 71  Resp: 18 18 18 18   Temp: 98.9 F (37.2 C) 99.1 F (37.3 C) 98.8 F (37.1 C)   TempSrc: Oral Oral Oral   SpO2: 99% 96% 99% 97%  Weight:      Height:        Last BM Date :  (PTA)  Intake/Output   Yesterday:  08/31 0701 - 09/01 0700 In: 3316.7 [P.O.:840; I.V.:2276.7; IV Piggyback:200.1] Out: 3420 [Urine:2780; Emesis/NG output:100; Drains:400; Blood:140] This shift:  Total I/O In: 163.3 [P.O.:120; I.V.:43.3] Out: -   Bowel function:  Flatus: No  BM:  No  Drain: Serosanguinous   Physical Exam:  General: Pt awake/alert in no acute distress Eyes: PERRL, normal EOM.  Sclera clear.  No icterus Neuro: CN II-XII intact w/o focal sensory/motor deficits. Lymph: No head/neck/groin lymphadenopathy Psych:  No delerium/psychosis/paranoia.  Oriented x 4 HENT: Normocephalic, Mucus membranes moist.  No thrush Neck: Supple, No tracheal deviation.  No obvious thyromegaly Chest: No pain to chest wall compression.  Good respiratory excursion.  No audible wheezing CV:  Pulses intact.  Regular rhythm.  No major extremity edema MS: Normal AROM mjr joints.  No obvious deformity  Abdomen: Soft.  Nondistended.  Mildly tender at incisions only.  No evidence of peritonitis.  No incarcerated hernias. Ileostomy pink with thin effluent in bag.  No flatus.  Ext:   No deformity.  No mjr edema.  No cyanosis Skin: No petechiae / purpurea.  No major sores.  Warm and  dry    Results:   Cultures: No results found for this or any previous visit (from the past 720 hour(s)).  Labs: Results for orders placed or performed during the hospital encounter of 12/14/21 (from the past 48 hour(s))  Basic metabolic panel     Status: Abnormal   Collection Time: 12/15/21  5:06 AM  Result Value Ref Range   Sodium 135 135 - 145 mmol/L   Potassium 3.2 (L) 3.5 - 5.1 mmol/L   Chloride 104 98 - 111 mmol/L   CO2 26 22 - 32 mmol/L   Glucose, Bld 112 (H) 70 - 99 mg/dL    Comment: Glucose reference range applies only to samples taken after fasting for at least 8 hours.   BUN 12 8 - 23 mg/dL   Creatinine, Ser 0.74 0.61 - 1.24 mg/dL   Calcium 7.8 (L) 8.9 - 10.3 mg/dL   GFR, Estimated >60 >60 mL/min    Comment: (NOTE) Calculated using the CKD-EPI Creatinine Equation (2021)    Anion gap 5 5 - 15    Comment: Performed at [facility], [place] [address] [place], [place] [address]  CBC     Status: Abnormal   Collection Time: 12/15/21  5:06 AM  Result Value  Ref Range   WBC 7.8 4.0 - 10.5 K/uL   RBC 2.72 (L) 4.22 - 5.81 MIL/uL   Hemoglobin 9.5 (L) 13.0 - 17.0 g/dL   HCT 28.1 (L) 39.0 - 52.0 %   MCV 103.3 (H) 80.0 - 100.0 fL   MCH 34.9 (H) 26.0 - 34.0 pg   MCHC 33.8 30.0 - 36.0 g/dL   RDW 13.6 11.5 - 15.5 %   Platelets 123 (L) 150 - 400 K/uL    Comment: CONSISTENT WITH PREVIOUS RESULT REPEATED TO VERIFY    nRBC 0.0 0.0 - 0.2 %    Comment: Performed at [facility], [place] [address], [place], [place] [address]  Magnesium     Status: Abnormal   Collection Time: 12/15/21  5:06 AM  Result Value Ref Range   Magnesium 1.3 (L) 1.7 - 2.4 mg/dL    Comment: Performed at [facility], [place] [address] [place], [place] [address]    Imaging / Studies: No results found.  Medications / Allergies: per chart  Antibiotics: Anti-infectives (From admission, onward)    Start     Dose/Rate Route Frequency Ordered Stop    12/14/21 1930  cefoTEtan (CEFOTAN) 2 g in sodium chloride 0.9 % 100 mL IVPB        2 g 200 mL/hr over 30 Minutes Intravenous Every 12 hours 12/14/21 1155 12/14/21 2143   12/14/21 1400  neomycin (MYCIFRADIN) tablet 1,000 mg  Status:  Discontinued       See Hyperspace for full Linked Orders Report.   1,000 mg Oral 3 times per day 12/14/21 0531 12/14/21 0535   12/14/21 1400  metroNIDAZOLE (FLAGYL) tablet 1,000 mg  Status:  Discontinued       See Hyperspace for full Linked Orders Report.   1,000 mg Oral 3 times per day 12/14/21 0531 12/14/21 0535   12/14/21 0600  cefoTEtan (CEFOTAN) 2 g in sodium chloride 0.9 % 100 mL IVPB        2 g 200 mL/hr over 30 Minutes Intravenous On call to O.R. 12/14/21 0531 12/14/21 0802         Note: Portions of this report may have been transcribed using voice recognition software. Every effort was made to ensure accuracy; however, inadvertent computerized transcription errors may be present.   Any transcriptional errors that result from this process are unintentional.    Adin Hector, MD, FACS, MASCRS Esophageal, Gastrointestinal & Colorectal Surgery Robotic and Minimally Invasive Surgery  [facility]. [address], [place], [place] [address] [phone] Fax [phone] Main  CONTACT INFORMATION:  Weekday (9AM-5PM): Call [facility] main office at [phone]  Weeknight (5PM-9AM) or Weekend/Holiday: Check [email or web address] (password " [email or web address]") for General Surgery [facility] coverage  (Please, do not use SecureChat as it is not reliable communication to reach operating surgeons for immediate patient care)      12/15/2021  7:35 AM

## 2021-12-15 NOTE — Plan of Care (Signed)
  Problem: Clinical Measurements: Goal: Postoperative complications will be avoided or minimized Outcome: Progressing   

## 2021-12-15 NOTE — TOC Initial Note (Signed)
Transition of Care [facility]) - Initial/Assessment Note   Patient Details  Name: Jesse [name] MRN: [id number] Date of Birth: 1950-10-21  [facility]) CM/SW Contact:    Sherie Don, LCSW Phone Number: 12/15/2021, 1:48 PM  Clinical Narrative: Patient has a new ostomy and will need [facility] services after discharge. CSW spoke with patient and patient is agreeable to [facility] making [name] referrals. CSW made referrals to the following agencies:  [facility]: declined, no nursing availability [facility]: declined, not accepting [facility] for wound care or ostomies [facility]: declined, no nursing availability [facility]: declined, not currently accepting [facility] referrals [facility]: declined [facility]: declined [facility]: declined [facility]: declined [facility]: accepted  CSW notified patient Alvis Lemmings accepted the referral for [facility]. TOC awaiting HHRN orders.  Expected Discharge Plan: [place] Barriers to Discharge: Continued Medical Work up  Patient Goals and CMS Choice Patient states their goals for this hospitalization and ongoing recovery are:: Return home CMS Medicare.gov Compare Post Acute Care list provided to:: Patient Choice offered to / list presented to : Patient  Expected Discharge Plan and Services Expected Discharge Plan: [place] In-house Referral: Clinical Social Work Post Acute Care Choice: [place] arrangements for the past 2 months: Single Family Home           DME Arranged: N/A DME Agency: NA HH Arranged: RN [place] Agency: [place] Date [place] Agency Contacted: 12/15/21 Time HH Agency Contacted: 90 Representative spoke with at [place]: Cindie  Prior Living Arrangements/Services Living arrangements for the past 2 months: [place] Lives with:: Spouse Patient language and need for interpreter reviewed:: Yes Do you feel safe going back to the place where you live?: Yes      Need for Family Participation in  Patient Care: No (Comment) Care giver support system in place?: Yes (comment) Criminal Activity/Legal Involvement Pertinent to Current Situation/Hospitalization: No - Comment as needed  Activities of Daily Living Home Assistive Devices/Equipment: Eyeglasses ADL Screening (condition at time of admission) Patient's cognitive ability adequate to safely complete daily activities?: Yes Is the patient deaf or have difficulty hearing?: No Does the patient have difficulty seeing, even when wearing glasses/contacts?: No Does the patient have difficulty concentrating, remembering, or making decisions?: No Patient able to express need for assistance with ADLs?: Yes Does the patient have difficulty dressing or bathing?: No Independently performs ADLs?: Yes (appropriate for developmental age) Does the patient have difficulty walking or climbing stairs?: Yes Weakness of Legs: None Weakness of Arms/Hands: None  Permission Sought/Granted Permission sought to share information with : Other (comment) Permission granted to share information with : Yes, Verbal Permission Granted Permission granted to share info w AGENCY: [place] agencies  Emotional Assessment Attitude/Demeanor/Rapport: Engaged Affect (typically observed): Accepting Orientation: : Oriented to Self, Oriented to Place, Oriented to  Time, Oriented to Situation Alcohol / Substance Use: Not Applicable Psych Involvement: No (comment)  Admission diagnosis:  Rectal tumor [D49.0] Patient Active Problem List   Diagnosis Date Noted   Ileostomy in place [facility]) 12/15/2021   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[place]) 08/21/2021   GIST (gastrointestinal stromal tumor), malignant ([place]) 07/18/2020   Encounter for antineoplastic chemotherapy 01/28/2020   Adenocarcinoma of left lung, stage 2 ([place]) 12/31/2019   Goals of care, counseling/discussion 12/31/2019   S/P lobectomy of lung 12/16/2019   Lung nodule 10/13/2019   Essential  hypertension 09/28/2016   Coronary artery calcification 09/28/2016   Impaired glucose tolerance 09/28/2016  NEOPLASM, MALIGNANT, BLADDER, HX OF 07/18/2007   Vitamin D deficiency 06/03/2007   DIVERTICULOSIS, COLON 01/14/2007   Osteoarthritis 01/14/2007   Dyslipidemia 01/09/2007   GERD 01/09/2007   REFLUX ESOPHAGITIS 06/01/2004   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   PCP:  [name], MD Pharmacy:   [facility] [place] [address] Phone: [phone] Fax: [phone]  [id number] - [place], [place] - [address] [place] [address] Phone: [phone] Fax: [phone]  Readmission Risk Interventions     No data to display

## 2021-12-16 LAB — CBC
HCT: 30.2 % — ABNORMAL LOW (ref 39.0–52.0)
Hemoglobin: 10 g/dL — ABNORMAL LOW (ref 13.0–17.0)
MCH: 35.3 pg — ABNORMAL HIGH (ref 26.0–34.0)
MCHC: 33.1 g/dL (ref 30.0–36.0)
MCV: 106.7 fL — ABNORMAL HIGH (ref 80.0–100.0)
Platelets: 122 10*3/uL — ABNORMAL LOW (ref 150–400)
RBC: 2.83 MIL/uL — ABNORMAL LOW (ref 4.22–5.81)
RDW: 13.7 % (ref 11.5–15.5)
WBC: 7 10*3/uL (ref 4.0–10.5)
nRBC: 0 % (ref 0.0–0.2)

## 2021-12-16 LAB — CREATININE, SERUM
Creatinine, Ser: 0.62 mg/dL (ref 0.61–1.24)
GFR, Estimated: >60 mL/min (ref >60)

## 2021-12-16 LAB — POTASSIUM: Potassium: 4 mmol/L (ref 3.5–5.1)

## 2021-12-16 MED ORDER — IMATINIB MESYLATE 100 MG PO TABS
400.0000 mg | ORAL_TABLET | Freq: Every day | ORAL | Status: DC
  Administered 2021-12-17 (×2): 400 mg via ORAL
  Filled 2021-12-16 (×2): qty 4

## 2021-12-16 NOTE — Progress Notes (Signed)
Mobility Specialist - Progress Note   12/16/21 1435  Mobility  HOB Elevated/Bed Position Self regulated  Activity Ambulated independently in hallway  Range of Motion/Exercises Active  Level of Assistance Independent  Assistive Device None  Distance Ambulated (ft) 250 ft  Activity Response Tolerated well  Transport method Ambulatory  $Mobility charge 1 Mobility   Pt received in bed and agreeable to mobility.  Pt to bed after session with all needs met.     [facility]

## 2021-12-16 NOTE — Progress Notes (Signed)
Mobility Specialist Cancellation/Refusal Note:   Reason for Cancellation/Refusal: Pt declined mobility at this time.Pt just ate & wants to rest. Will check back as schedule permits.      [facility]

## 2021-12-16 NOTE — Progress Notes (Signed)
2 Days Post-Op   Subjective/Chief Complaint: No complaints   Objective: Vital signs in last 24 hours: Temp:  [97.5 F (36.4 C)-98.9 F (37.2 C)] 97.5 F (36.4 C) (09/02 0539) Pulse Rate:  [72-88] 72 (09/02 0539) Resp:  [18-20] 18 (09/02 0539) BP: (93-145)/(57-80) 145/64 (09/02 0539) SpO2:  [97 %-99 %] 99 % (09/02 0539) Weight:  [75.2 kg] 75.2 kg (09/02 0500) Last BM Date : 12/15/21  Intake/Output from previous day: 09/01 0701 - 09/02 0700 In: 1053.3 [P.O.:360; I.V.:243.3; IV Piggyback:450] Out: 2020 [Urine:875; Drains:395; Stool:750] Intake/Output this shift: No intake/output data recorded.  General appearance: alert and cooperative Resp: clear to auscultation bilaterally Cardio: regular rate and rhythm GI: soft, mild tenderness. Ostomy pink and productive  Lab Results:  Recent Labs    12/15/21 0506 12/16/21 0436  WBC 7.8 7.0  HGB 9.5* 10.0*  HCT 28.1* 30.2*  PLT 123* 122*   BMET Recent Labs    12/15/21 0506 12/16/21 0436  NA 135  --   K 3.2* 4.0  CL 104  --   CO2 26  --   GLUCOSE 112*  --   BUN 12  --   CREATININE 0.74 0.62  CALCIUM 7.8*  --    PT/INR No results for input(s): "LABPROT", "INR" in the last 72 hours. ABG No results for input(s): "PHART", "HCO3" in the last 72 hours.  Invalid input(s): "PCO2", "PO2"  Studies/Results: No results found.  Anti-infectives: Anti-infectives (From admission, onward)    Start     Dose/Rate Route Frequency Ordered Stop   12/14/21 1930  cefoTEtan (CEFOTAN) 2 g in sodium chloride 0.9 % 100 mL IVPB        2 g 200 mL/hr over 30 Minutes Intravenous Every 12 hours 12/14/21 1155 12/14/21 2143   12/14/21 1400  neomycin (MYCIFRADIN) tablet 1,000 mg  Status:  Discontinued       See Hyperspace for full Linked Orders Report.   1,000 mg Oral 3 times per day 12/14/21 0531 12/14/21 0535   12/14/21 1400  metroNIDAZOLE (FLAGYL) tablet 1,000 mg  Status:  Discontinued       See Hyperspace for full Linked Orders Report.    1,000 mg Oral 3 times per day 12/14/21 0531 12/14/21 0535   12/14/21 0600  cefoTEtan (CEFOTAN) 2 g in sodium chloride 0.9 % 100 mL IVPB        2 g 200 mL/hr over 30 Minutes Intravenous On call to O.R. 12/14/21 0531 12/14/21 0802       Assessment/Plan: s/p Procedure(s): XI ROBOTIC ASSISTED LOWER ANTERIOR ULTRA LOW RECTOSIGMOID RESECTION, COLOANAL HAND SEWN ANASTOMOSIS, AND BILATERAL TAP BLOCK (N/A) DIVERTING ILEOSTOMY (N/A) CYSTOSCOPY with FIREFLY INJECTION (N/A) Advance diet Monitor ileostomy output -Follow-up on pathology -Stop IV fluids with aggressive backup as needed. -Foley catheter out.  I&O catheter replaced as needed. -Enhance recovery protocol.  Advance diet as tolerated. -Patient high risk for high output ileostomy given the need for fecal diversion with a loop ileostomy.  We will start with soluble fiber twice daily.  If has high-volume, and iron sulfate 325 p.o. twice daily and Imodium 2 mg twice daily.  Gradually increase Imodium until less than a liter output.  We will see. -Okay to go back on his [place]. -Hypertension control. -GERD control. -VTE prophylaxis- SCDs, etc -mobilize as tolerated to help recovery  LOS: 2 days    Autumn Messing III 12/16/2021

## 2021-12-17 MED ORDER — TRAMADOL HCL 50 MG PO TABS
50.0000 mg | ORAL_TABLET | Freq: Four times a day (QID) | ORAL | 0 refills | Status: DC | PRN
Start: 2021-12-17 — End: 2022-03-22

## 2021-12-17 NOTE — TOC Progression Note (Signed)
Transition of Care [facility]) - Progression Note    Patient Details  Name: Jesse MIDDLESWORTH MRN: [id number] Date of Birth: May 04, 1950  Transition of Care [facility]) CM/SW Contact  Mikaylah Libbey, Juliann Pulse, RN Phone Number: 12/17/2021, 12:03 PM  Clinical Narrative:Received cons for ostomy supplies-WOC nurse has already placed note stating enrolled in ostomy supplies;TOC will need [facility] order, & face to face prior d/c for [facility] to provide [facility] services after d/c.Await HHRN order & face to face. Noted WOC ongoing teaching.       Expected Discharge Plan: [place] Barriers to Discharge: Continued Medical Work up  Expected Discharge Plan and Services Expected Discharge Plan: [place] In-house Referral: Clinical Social Work   Post Acute Care Choice: [place] arrangements for the past 2 months: [place] Expected Discharge Date: 12/17/21               DME Arranged: N/A DME Agency: NA       HH Arranged: RN [place] Agency: [place] Date [facility] Agency Contacted: 12/15/21 Time HH Agency Contacted: [phone] Representative spoke with at [place]: Cindie   Social Determinants of Health ([place]) Interventions    Readmission Risk Interventions     No data to display

## 2021-12-17 NOTE — Progress Notes (Signed)
3 Days Post-Op   Subjective/Chief Complaint: No complaints. Ostomy output manageable   Objective: Vital signs in last 24 hours: Temp:  [98.1 F (36.7 C)-98.6 F (37 C)] 98.2 F (36.8 C) (09/03 0548) Pulse Rate:  [70-82] 72 (09/03 0548) Resp:  [16-18] 17 (09/03 0548) BP: (130-131)/(69-71) 131/69 (09/03 0548) SpO2:  [98 %-99 %] 98 % (09/03 0548) Weight:  [73.9 kg] 73.9 kg (09/03 0500) Last BM Date : 12/17/21  Intake/Output from previous day: 09/02 0701 - 09/03 0700 In: 1140 [P.O.:1140] Out: 945 [Drains:170; Stool:775] Intake/Output this shift: No intake/output data recorded.  General appearance: alert and cooperative Resp: clear to auscultation bilaterally Cardio: regular rate and rhythm GI: soft, nontender. Ostomy pink and productive  Lab Results:  Recent Labs    12/15/21 0506 12/16/21 0436  WBC 7.8 7.0  HGB 9.5* 10.0*  HCT 28.1* 30.2*  PLT 123* 122*   BMET Recent Labs    12/15/21 0506 12/16/21 0436  NA 135  --   K 3.2* 4.0  CL 104  --   CO2 26  --   GLUCOSE 112*  --   BUN 12  --   CREATININE 0.74 0.62  CALCIUM 7.8*  --    PT/INR No results for input(s): "LABPROT", "INR" in the last 72 hours. ABG No results for input(s): "PHART", "HCO3" in the last 72 hours.  Invalid input(s): "PCO2", "PO2"  Studies/Results: No results found.  Anti-infectives: Anti-infectives (From admission, onward)    Start     Dose/Rate Route Frequency Ordered Stop   12/14/21 1930  cefoTEtan (CEFOTAN) 2 g in sodium chloride 0.9 % 100 mL IVPB        2 g 200 mL/hr over 30 Minutes Intravenous Every 12 hours 12/14/21 1155 12/14/21 2143   12/14/21 1400  neomycin (MYCIFRADIN) tablet 1,000 mg  Status:  Discontinued       See Hyperspace for full Linked Orders Report.   1,000 mg Oral 3 times per day 12/14/21 0531 12/14/21 0535   12/14/21 1400  metroNIDAZOLE (FLAGYL) tablet 1,000 mg  Status:  Discontinued       See Hyperspace for full Linked Orders Report.   1,000 mg Oral 3 times  per day 12/14/21 0531 12/14/21 0535   12/14/21 0600  cefoTEtan (CEFOTAN) 2 g in sodium chloride 0.9 % 100 mL IVPB        2 g 200 mL/hr over 30 Minutes Intravenous On call to O.R. 12/14/21 0531 12/14/21 0802       Assessment/Plan: s/p Procedure(s): XI ROBOTIC ASSISTED LOWER ANTERIOR ULTRA LOW RECTOSIGMOID RESECTION, COLOANAL HAND SEWN ANASTOMOSIS, AND BILATERAL TAP BLOCK (N/A) DIVERTING ILEOSTOMY (N/A) CYSTOSCOPY with FIREFLY INJECTION (N/A) Advance diet Discharge TOC consult to make sure he has ostomy supplies  LOS: 3 days    Jesse [name] 12/17/2021

## 2021-12-18 NOTE — Consult Note (Signed)
[place] Nurse ostomy follow up Stoma type/location: RLQ, loop ileostomy  Stomal assessment/size: 1 1.2" pink, moist, prolapsed Peristomal assessment: intact  Treatment options for stomal/peristomal skin: 2" skin barrier Output liquid stool  Ostomy pouching: 1pc flat with 2" skin barrier Education provided:  Allowed patient to change pouch with assistance as needed; he had cut new skin barrier Friday. Used this for pouch change today.  Patient self reports he has been up to the bathroom and emptied several times.  Reminded use of wick to clean spout  Discussed bathing, diet, gas, medication use, constipation, diarrhea, dehydration Discussed restrictions on his activities outdoors, lifting and such.  He maintains a large garden and farm.   Provided information on stelth belt, osteosecrets and stoma guard for this reason.  Patient will have [facility] for support    Enrolled patient in [facility] Discharge program: Yes  For DC today [place], [place], [place]

## 2021-12-18 NOTE — Progress Notes (Signed)
Patient was given discharge instructions as well as instructions for the JP drain. All questions were answered. Patient was stable for discharge and was walked to the main exit.

## 2021-12-18 NOTE — TOC Transition Note (Signed)
Transition of Care [facility]) - CM/SW Discharge Note   Patient Details  Name: Jesse [name] MRN: [id number] Date of Birth: May 30, 1950  [facility] CM/SW Contact:  Ross Ludwig, LCSW Phone Number: 12/18/2021, 12:10 PM   Clinical Narrative:     Patient will be going home with home health RN through [facility].  Csw signing off please reconsult with any other social work needs, home health agency has been notified of planned discharge.    Final next level of care: [place] Barriers to Discharge: Barriers Resolved   Patient Goals and CMS Choice Patient states their goals for this hospitalization and ongoing recovery are:: To return back home with home health. CMS Medicare.gov Compare Post Acute Care list provided to:: Patient Choice offered to / list presented to : Patient  Discharge Placement                       Discharge Plan and Services In-house Referral: Clinical Social Work   Post Acute Care Choice: Home Health          DME Arranged: Ostomy supplies DME Agency: NA       HH Arranged: RN [place] Agency: [place] Date [facility] Agency Contacted: 12/18/21 Time [place]: [phone] Representative spoke with at [place]: Cindie  Social Determinants of Health ([place]) Interventions     Readmission Risk Interventions     No data to display

## 2021-12-18 NOTE — Progress Notes (Signed)
Patient ID: Jesse [name], male   DOB: 09-01-1950, 71 y.o.   MRN: [id number]  Kept overnight so he can have ostomy training today before discharge. He is doing well so will discharge home after that

## 2021-12-20 DIAGNOSIS — Z483 Aftercare following surgery for neoplasm: Secondary | ICD-10-CM | POA: Diagnosis not present

## 2021-12-20 DIAGNOSIS — C3492 Malignant neoplasm of unspecified part of left bronchus or lung: Secondary | ICD-10-CM | POA: Diagnosis not present

## 2021-12-20 DIAGNOSIS — Z432 Encounter for attention to ileostomy: Secondary | ICD-10-CM | POA: Diagnosis not present

## 2021-12-20 DIAGNOSIS — C49A5 Gastrointestinal stromal tumor of rectum: Secondary | ICD-10-CM | POA: Diagnosis not present

## 2021-12-20 DIAGNOSIS — D630 Anemia in neoplastic disease: Secondary | ICD-10-CM | POA: Diagnosis not present

## 2021-12-21 LAB — SURGICAL PATHOLOGY

## 2021-12-21 NOTE — Discharge Summary (Signed)
Physician Discharge Summary    Patient ID: Jesse [name] MRN: [id number] DOB/AGE: [birth year]  71 y.o.  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[place]) Active Problems:   GERD   Atrophic gastritis   Osteoarthritis   NEOPLASM, MALIGNANT, BLADDER, HX OF   Essential hypertension   Adenocarcinoma of left lung, stage 2 ([place])   GIST (gastrointestinal stromal tumor), malignant ([place])   Ileostomy in place ([place])   12/14/2021  POST-OPERATIVE DIAGNOSIS:   GIST TUMOR OF RECTUM LIVER NODULARITY    PROCEDURE:   -XI ROBOTIC ASSISTED VERY LOW ANTERIOR RECTOSIGMOID RESECTION -COLOANAL HANDSEWN ANASTOMOSIS -DIVERTING LOOP ILEOSTOMY -WEDGE LIVER BIOPSY -INTRAOPERATIVE ASSESSMENT OF TISSUE VASCULAR PERFUSION USING ICG (indocyanine green) IMMUNOFLUORESCENCE -TRANSVERSUS ABDOMINIS PLANE (TAP) BLOCK - BILATERAL   SURGEON:  Adin Hector, MD  Consults: Wound ostomy consult nurse [facility])  Hospital Course:   The patient underwent  the surgery above.  Postoperatively, the patient gradually mobilized and advanced to a solid diet.  Pain and other symptoms were treated aggressively.    By the time of discharge, the patient was walking well the hallways, eating food, having flatus. Ostomy training began & tolerated.   <1L from ileostomy.  Pain was well-controlled on an oral medications.  Based on meeting discharge criteria and continuing to  recover, I felt it was safe for the patient to be discharged from the hospital to further recover with close followup. Postoperative recommendations were discussed in detail.  They are written as well.  Discharged Condition: good  Discharge Exam: Blood pressure 130/68, pulse 70, temperature (!) 97.5 F (36.4 C), temperature source Oral, resp. rate 16, height 5\' 9"  (1.753 m), weight 73.3 kg, SpO2 99 %.  General: Pt awake/alert/oriented x4 in No acute distress Eyes: PERRL, normal EOM.  Sclera clear.  No icterus Neuro: CN II-XII intact w/o focal sensory/motor deficits. Lymph: No head/neck/groin lymphadenopathy Psych:  No delerium/psychosis/paranoia HENT: Normocephalic, Mucus membranes moist.  No thrush Neck: Supple, No tracheal deviation Chest:  No chest wall pain w good excursion CV:  Pulses intact.  Regular rhythm MS: Normal AROM mjr joints.  No obvious deformity Abdomen: Soft.  Nondistended.  Nontender.  Ileostomy pink w gas/effluent.  No evidence of peritonitis.  No incarcerated hernias. Ext:  SCDs BLE.  No mjr edema.  No cyanosis Skin: No petechiae / purpura   Disposition:    Follow-up Information     Michael Boston, MD. Schedule an appointment as soon as possible for a visit in 3 week(s).   Specialties: General Surgery, Colon and Rectal Surgery Why: To follow up after your operation Contact information: [place] [address] [phone]         Care, [facility] Follow up.   Specialty: Home Health Services Why: Alvis Lemmings will provide RN for the new ostomy after discharge. Contact information: [place] [address] [phone]                 Discharge disposition: 01-Home or Self  Care       Discharge Instructions     Call MD for:   Complete by: As directed    FEVER > 101.5 F  (temperatures < 101.5 F are not significant)   Call MD for:  difficulty breathing, headache or visual disturbances    Complete by: As directed    Call MD for:  extreme fatigue   Complete by: As directed    Call MD for:  extreme fatigue   Complete by: As directed    Call MD for:  hives   Complete by: As directed    Call MD for:  persistant dizziness or light-headedness   Complete by: As directed    Call MD for:  persistant dizziness or light-headedness   Complete by: As directed    Call MD for:  persistant nausea and vomiting   Complete by: As directed    Call MD for:  persistant nausea and vomiting   Complete by: As directed    Call MD for:  redness, tenderness, or signs of infection (pain, swelling, redness, odor or green/yellow discharge around incision site)   Complete by: As directed    Call MD for:  redness, tenderness, or signs of infection (pain, swelling, redness, odor or green/yellow discharge around incision site)   Complete by: As directed    Call MD for:  severe uncontrolled pain   Complete by: As directed    Call MD for:  severe uncontrolled pain   Complete by: As directed    Call MD for:  temperature >100.4   Complete by: As directed    Diet - low sodium heart healthy   Complete by: As directed    Start with a bland diet such as soups, liquids, starchy foods, low fat foods, etc. the first few days at home. Gradually advance to a solid, low-fat, high fiber diet by the end of the first week at home.   Add a fiber supplement to your diet (Metamucil, etc) If you feel full, bloated, or constipated, stay on a full liquid or pureed/blenderized diet for a few days until you feel better and are no longer constipated.   Diet - low sodium heart healthy   Complete by: As directed    Discharge instructions   Complete by: As directed    See Discharge Instructions If you are not getting better after two weeks or are noticing you are getting worse, contact our office [phone] for further advice.  We may need to adjust your medications, re-evaluate you in the office, send you to the  emergency room, or see what other things we can do to help. The clinic staff is available to answer your questions during regular business hours (8:30am-5pm).  Please don't hesitate to call and ask to speak to one of our nurses for clinical concerns.    A surgeon from [facility] is always on call at the hospitals 24 hours/day If you have a medical emergency, go to the nearest emergency room or call 911.   Discharge instructions   Complete by: As directed    May shower. Diet as tolerated. No heavy lifting. Routine ostomy care   Discharge wound care:   Complete by: As directed    It is good for closed incisions and even open wounds to be washed every day.  Shower every day.  Short baths are fine.  Wash the incisions and wounds clean with soap & water.    You may leave closed incisions  open to air if it is dry.   You may cover the incision with clean gauze & replace it after your daily shower for comfort.  TEGADERM:  You have clear gauze band-aid dressings over your closed incision(s).  Remove the dressings 3 days after surgery.   Driving Restrictions   Complete by: As directed    You may drive when: - you are no longer taking narcotic prescription pain medication - you can comfortably wear a seatbelt - you can safely make sudden turns/stops without pain.   Increase activity slowly   Complete by: As directed    Start light daily activities --- self-care, walking, climbing stairs- beginning the day after surgery.  Gradually increase activities as tolerated.  Control your pain to be active.  Stop when you are tired.  Ideally, walk several times a day, eventually an hour a day.   Most people are back to most day-to-day activities in a few weeks.  It takes 4-6 weeks to get back to unrestricted, intense activity. If you can walk 30 minutes without difficulty, it is safe to try more intense activity such as jogging, treadmill, bicycling, low-impact aerobics, swimming, etc. Save the most  intensive and strenuous activity for last (Usually 4-8 weeks after surgery) such as sit-ups, heavy lifting, contact sports, etc.  Refrain from any intense heavy lifting or straining until you are off narcotics for pain control.  You will have off days, but things should improve week-by-week. DO NOT PUSH THROUGH PAIN.  Let pain be your guide: If it hurts to do something, don't do it.   Increase activity slowly   Complete by: As directed    Lifting restrictions   Complete by: As directed    If you can walk 30 minutes without difficulty, it is safe to try more intense activity such as jogging, treadmill, bicycling, low-impact aerobics, swimming, etc. Save the most intensive and strenuous activity for last (Usually 4-8 weeks after surgery) such as sit-ups, heavy lifting, contact sports, etc.   Refrain from any intense heavy lifting or straining until you are off narcotics for pain control.  You will have off days, but things should improve week-by-week. DO NOT PUSH THROUGH PAIN.  Let pain be your guide: If it hurts to do something, don't do it.  Pain is your body warning you to avoid that activity for another week until the pain goes down.   May shower / Bathe   Complete by: As directed    May walk up steps   Complete by: As directed    No wound care   Complete by: As directed    Remove dressing in 72 hours   Complete by: As directed    Make sure all dressings are removed by the third day after surgery.  Leave incisions open to air.  OK to cover incisions with gauze or bandages as desired   Sexual Activity Restrictions   Complete by: As directed    You may have sexual intercourse when it is comfortable. If it hurts to do something, stop.       Allergies as of 12/18/2021   No Known Allergies      Medication List     STOP taking these medications    VITAMIN D PO       TAKE these medications    amLODipine 5 MG tablet Commonly known as: NORVASC Take 1 tablet (5 mg total) by mouth  daily.   atorvastatin 40 MG tablet Commonly known as: LIPITOR  Take 1/2 tablet (20 mg total) by mouth daily.   ibuprofen 200 MG tablet Commonly known as: ADVIL Take 600 mg by mouth every 6 (six) hours as needed for moderate pain.   imatinib 400 MG tablet Commonly known as: GLEEVEC Take 1 tablet (400 mg total) by mouth daily. Take with meals and large glass of water.Caution:Chemotherapy. What changed: Another medication with the same name was removed. Continue taking this medication, and follow the directions you see here.   omeprazole 40 MG capsule Commonly known as: PRILOSEC Take 1 capsule (40 mg total) by mouth daily.   PreserVision AREDS 2 Caps Take 1 capsule by mouth 2 (two) times daily.   traMADol 50 MG tablet Commonly known as: ULTRAM Take 1-2 tablets (50-100 mg total) by mouth every 6 (six) hours as needed for moderate pain.   VITAMIN D3 PO Take 1 capsule by mouth daily.               Discharge Care Instructions  (From admission, onward)           Start     Ordered   12/14/21 0000  Discharge wound care:       Comments: It is good for closed incisions and even open wounds to be washed every day.  Shower every day.  Short baths are fine.  Wash the incisions and wounds clean with soap & water.    You may leave closed incisions open to air if it is dry.   You may cover the incision with clean gauze & replace it after your daily shower for comfort.  TEGADERM:  You have clear gauze band-aid dressings over your closed incision(s).  Remove the dressings 3 days after surgery.   12/14/21 0759            Significant Diagnostic Studies:  No results found for this or any previous visit (from the past 72 hour(s)).  No results found.  Past Medical History:  Diagnosis Date   Anemia    Cancer ([place])    bladder cancer   COPD (chronic obstructive pulmonary disease) ([place])    mild    DIVERTICULOSIS, COLON 01/14/2007   Qualifier: Diagnosis of  By: Paulina Fusi RN,  [name]    GASTRITIS, CHRONIC 06/01/2004   Qualifier: Diagnosis of  By: Nolon Rod CMA (AAMA), Robin     GERD 01/09/2007   Qualifier: Diagnosis of  By: Sherren Mocha, RN, Ellen     Heart murmur    as a child; no murmur heard 12/14/19   HYPERLIPIDEMIA 01/09/2007   Qualifier: Diagnosis of  By: Sherren Mocha RN, Dorian Pod     Hypertension    Lung mass    left upper nodule   Macular degeneration    right eye   NEOPLASM, MALIGNANT, BLADDER, HX OF 2002   Qualifier: Diagnosis of  By: Nolon Rod CMA (AAMA), Robin  / no chemo or radiation   OSTEOARTHRITIS 01/14/2007   Qualifier: Diagnosis of  By: Paulina Fusi, RN, [name] - knees   Rectal mass    Reflux esophagitis 06/01/2004   Qualifier: Diagnosis of  By: Nolon Rod CMA (AAMA), Robin     TOBACCO USER 02/16/2009   Qualifier: Diagnosis of  By: Burnice Logan  MD, Doretha Sou    VITAMIN D DEFICIENCY 06/03/2007   Qualifier: Diagnosis of  By: Burnice Logan  MD, Doretha Sou     Past Surgical History:  Procedure Laterality Date   BRONCHIAL BIOPSY  10/13/2019   Procedure: BRONCHIAL BIOPSIES;  Surgeon: Garner Nash, DO;  Location: [facility];  Service: Pulmonary;;   BRONCHIAL BRUSHINGS  10/13/2019   Procedure: BRONCHIAL BRUSHINGS;  Surgeon: Garner Nash, DO;  Location: [facility];  Service: Pulmonary;;   BRONCHIAL NEEDLE ASPIRATION BIOPSY  10/13/2019   Procedure: BRONCHIAL NEEDLE ASPIRATION BIOPSIES;  Surgeon: Garner Nash, DO;  Location: [facility];  Service: Pulmonary;;   BRONCHIAL WASHINGS  10/13/2019   Procedure: BRONCHIAL WASHINGS;  Surgeon: Garner Nash, DO;  Location: [facility];  Service: Pulmonary;;   COLONOSCOPY     multiple   DIVERTING ILEOSTOMY N/A 12/14/2021   Procedure: DIVERTING ILEOSTOMY;  Surgeon: Michael Boston, MD;  Location: [facility];  Service: General;  Laterality: N/A;   INTERCOSTAL NERVE BLOCK Left 12/16/2019   Procedure: INTERCOSTAL NERVE BLOCK;  Surgeon: Lajuana Matte, MD;  Location: [facility];  Service: Thoracic;  Laterality: Left;   KNEE  SURGERY  07/20/2008   bilat.   NASAL SEPTUM SURGERY  1995   NODE DISSECTION Left 12/16/2019   Procedure: NODE DISSECTION;  Surgeon: Lajuana Matte, MD;  Location: [facility];  Service: Thoracic;  Laterality: Left;   TONSILLECTOMY     TRANSURETHRAL RESECTION OF BLADDER TUMOR  2002   UPPER GASTROINTESTINAL ENDOSCOPY     VIDEO BRONCHOSCOPY WITH ENDOBRONCHIAL NAVIGATION N/A 10/13/2019   Procedure: VIDEO BRONCHOSCOPY WITH ENDOBRONCHIAL NAVIGATION;  Surgeon: Garner Nash, DO;  Location: [facility] [facility];  Service: Pulmonary;  Laterality: N/A;   WISDOM TOOTH EXTRACTION     XI ROBOTIC ASSISTED LOWER ANTERIOR RESECTION N/A 12/14/2021   Procedure: XI ROBOTIC ASSISTED LOWER ANTERIOR ULTRA LOW RECTOSIGMOID RESECTION, COLOANAL HAND SEWN ANASTOMOSIS, AND BILATERAL TAP BLOCK;  Surgeon: Michael Boston, MD;  Location: [facility];  Service: General;  Laterality: N/A;    Social History   Socioeconomic History   Marital status: Married    Spouse name: Not on file   Number of children: 3   Years of education: Not on file   Highest education level: Not on file  Occupational History    Comment: semi retired   Tobacco Use   Smoking status: Former    Packs/day: 0.50    Years: 40.00    Total pack years: 20.00    Types: Cigarettes    Quit date: 09/21/2019    Years since quitting: 2.2   Smokeless tobacco: Never  Vaping Use   Vaping Use: Never used  Substance and Sexual Activity   Alcohol use: Yes    Alcohol/week: 15.0 standard drinks of alcohol    Types: 15 Standard drinks or equivalent per week    Comment: wine/beer/martini   Drug use: Not Currently    Types: Marijuana    Comment: Smokes marijuana occasional, last use in 08/2019   Sexual activity: Not on file  Other Topics Concern   Not on file  Social History Narrative   Married   3 children: 3 grandchildren; 3 great grandchildren   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   Enjoys yard work and [place] Strain: [place]  (07/18/2021)   Overall Financial Resource Strain (CARDIA)    Difficulty of Paying Living Expenses: Not hard at [place]: No [place] (07/18/2021)   Hunger Vital Sign    Worried About Running Out of Food in the Last Year: Never true    [place] in the Last Year: Never true  Transportation Needs: No Transportation Needs (07/18/2021)   PRAPARE - Transportation  Lack of Transportation (Medical): No    Lack of Transportation (Non-Medical): No  Physical Activity: Sufficiently Active (07/18/2021)   Exercise Vital Sign    Days of Exercise per Week: 7 days    Minutes of Exercise per Session: 60 min  Stress: No Stress Concern Present (07/18/2021)   [place]    Feeling of Stress : Not at all  Social Connections: Moderately Isolated (07/12/2020)   Social Connection and Isolation Panel [NHANES]    Frequency of Communication with Friends and Family: More than three times a week    Frequency of Social Gatherings with Friends and Family: More than three times a week    Attends Religious Services: Never    Marine scientist or Organizations: No    Attends Archivist Meetings: Never    Marital Status: Married  Human resources officer Violence: Not At Risk (07/12/2020)   Humiliation, Afraid, Rape, and Kick questionnaire    Fear of Current or Ex-Partner: No    Emotionally Abused: No    Physically Abused: No    Sexually Abused: No    Family History  Problem Relation Age of Onset   Dementia Mother    Diabetes Father    Mental illness Father    Peripheral vascular disease Brother     No current facility-administered medications for this encounter.   Current Outpatient Medications  Medication Sig Dispense Refill   amLODipine (NORVASC) 5 MG tablet Take 1 tablet (5 mg total) by mouth daily. 90 tablet 3   atorvastatin (LIPITOR) 40 MG tablet Take 1/2 tablet (20 mg  total) by mouth daily. 90 tablet 3   Cholecalciferol (VITAMIN D3 PO) Take 1 capsule by mouth daily.     ibuprofen (ADVIL) 200 MG tablet Take 600 mg by mouth every 6 (six) hours as needed for moderate pain.     imatinib (GLEEVEC) 400 MG tablet Take 1 tablet (400 mg total) by mouth daily. Take with meals and large glass of water.Caution:Chemotherapy. 30 tablet 3   Multiple Vitamins-Minerals (PRESERVISION AREDS 2) CAPS Take 1 capsule by mouth 2 (two) times daily.     omeprazole (PRILOSEC) 40 MG capsule Take 1 capsule (40 mg total) by mouth daily. 90 capsule 3   traMADol (ULTRAM) 50 MG tablet Take 1-2 tablets (50-100 mg total) by mouth every 6 (six) hours as needed for moderate pain. 20 tablet 0     No Known Allergies  Signed:   Adin Hector, MD, FACS, MASCRS Esophageal, Gastrointestinal & Colorectal Surgery Robotic and Minimally Invasive Surgery  [facility] [address] [place], [place] [address] [phone] Fax [phone] [address]  CONTACT INFORMATION:  Weekday (9AM-5PM): Call [facility] main office at [phone]  Weeknight (5PM-9AM) or Weekend/Holiday: Check [email or web address] (password " [email or web address]") for General Surgery [facility] coverage  (Please, do not use SecureChat as it is not reliable communication to reach operating surgeons for immediate patient care)      12/21/2021, 11:59 AM

## 2021-12-21 NOTE — Progress Notes (Signed)
GI Tumor Board patient referral:   Jesse [name]  1950/12/17 [id number]  CARE TEAM: Patient Care Team: [name], MD as PCP - General (Family Medicine) Curt Bears, MD as Consulting Physician (Oncology) Viona Gilmore, [facility] as Pharmacist (Pharmacist) Michael Boston, MD as Consulting Physician (General Surgery) Gatha Mayer, MD as Consulting Physician (Gastroenterology) Garner Nash, DO as Consulting Physician (Pulmonary Disease) Alexis Frock, MD as Consulting Physician (Urology)  Diagnosis: GIST tumor of rectum  MD Care Team:  Michael Boston, MD - surgery, Medical Oncology: Antionette Poles, and Gastroenterology:  Dr Carlean Purl  Focus of discussion: Radiology & Pathology reviews - comprehensive   Please send to GI Tumor Coordinator Ihor Gully)  in [place] message and attach the medical record to it

## 2021-12-26 ENCOUNTER — Ambulatory Visit (HOSPITAL_COMMUNITY)
Admission: RE | Admit: 2021-12-26 | Discharge: 2021-12-26 | Disposition: A | Discharge status: Home / Self Care | Payer: Medicare Other | Source: Ambulatory Visit | Attending: Nurse Practitioner | Admitting: Nurse Practitioner

## 2021-12-26 DIAGNOSIS — Z483 Aftercare following surgery for neoplasm: Secondary | ICD-10-CM | POA: Diagnosis not present

## 2021-12-26 DIAGNOSIS — K9419 Other complications of enterostomy: Secondary | ICD-10-CM | POA: Diagnosis not present

## 2021-12-26 DIAGNOSIS — L24B0 Irritant contact dermatitis related to unspecified stoma or fistula: Secondary | ICD-10-CM | POA: Diagnosis not present

## 2021-12-26 DIAGNOSIS — D630 Anemia in neoplastic disease: Secondary | ICD-10-CM | POA: Diagnosis not present

## 2021-12-26 DIAGNOSIS — C49A5 Gastrointestinal stromal tumor of rectum: Secondary | ICD-10-CM | POA: Insufficient documentation

## 2021-12-26 DIAGNOSIS — Z432 Encounter for attention to ileostomy: Secondary | ICD-10-CM | POA: Diagnosis not present

## 2021-12-26 DIAGNOSIS — C3492 Malignant neoplasm of unspecified part of left bronchus or lung: Secondary | ICD-10-CM | POA: Diagnosis not present

## 2021-12-26 DIAGNOSIS — Z932 Ileostomy status: Secondary | ICD-10-CM | POA: Diagnosis not present

## 2021-12-26 DIAGNOSIS — Z933 Colostomy status: Secondary | ICD-10-CM | POA: Diagnosis not present

## 2021-12-26 NOTE — Discharge Instructions (Signed)
Will set up with [facility] 1 piece flat with barrier ring.

## 2021-12-26 NOTE — Progress Notes (Signed)
[facility]   Reason for visit:  RLQ loop ileostomy HPI:  Rectal GIST tumor Rectosigmoid resection with diverting loop ileostomy ROS  Review of Systems  Gastrointestinal:        RLQ ileostomy  Skin:  Positive for rash.       Resolving peristomal breakdown   All other systems reviewed and are negative.  Vital signs:  BP 137/81   Pulse 84   Temp 97.7 F (36.5 C)   Resp 18   SpO2 100%  Exam:  Physical Exam Vitals reviewed.  Constitutional:      Appearance: Normal appearance.     Comments: HAs lost 10 pounds he would like to regain  Abdominal:     Palpations: Abdomen is soft.     Comments: RLQ ileostomy   Neurological:     Mental Status: He is alert and oriented to person, place, and time.  Psychiatric:        Mood and Affect: Mood normal.        Behavior: Behavior normal.     Stoma type/location:  RLQ ileostomy, prolapsed Stomal assessment/size:  1 1/2" pink and moist  Peristomal assessment:  intact Treatment options for stomal/peristomal skin: barrier ring and flat 1 piece pouch. Output: soft brown stool Ostomy pouching: 1pc.flat with barrier ring  Is curious about 2 piece pouch. I provide him a sample and explain that the rigid plastic on the 2 piece may not be as comfortable.  He is wearing the stealthbelt ostomy shield and feels this is giving him more security to do outside work around his home.  We discuss not engaging in strenuous work and risk of hernia, dehydration in the heat, especially.  He states he is being very careful  Education provided:  see above Provided 2 piece pouches Discussed dehydration, signs of blockage.  Has not had leaks with his bags.     Impression/dx  Ileostomy, prolapsed Discussion  Will set up for supplies with [facility].  Plan  See back as needed.     Visit time: 45 minutes.   Domenic Moras FNP-BC

## 2021-12-27 ENCOUNTER — Other Ambulatory Visit: Payer: Self-pay

## 2021-12-27 NOTE — Progress Notes (Signed)
The proposed treatment discussed in conference is for discussion purpose only and is not a binding recommendation.  The patients have not been physically examined, or presented with their treatment options.  Therefore, final treatment plans cannot be decided.  

## 2022-01-01 DIAGNOSIS — Z932 Ileostomy status: Secondary | ICD-10-CM | POA: Diagnosis not present

## 2022-01-02 ENCOUNTER — Other Ambulatory Visit (HOSPITAL_COMMUNITY): Payer: Self-pay

## 2022-01-02 DIAGNOSIS — C49A5 Gastrointestinal stromal tumor of rectum: Secondary | ICD-10-CM | POA: Diagnosis not present

## 2022-01-02 DIAGNOSIS — Z483 Aftercare following surgery for neoplasm: Secondary | ICD-10-CM | POA: Diagnosis not present

## 2022-01-02 DIAGNOSIS — Z432 Encounter for attention to ileostomy: Secondary | ICD-10-CM | POA: Diagnosis not present

## 2022-01-02 DIAGNOSIS — C3492 Malignant neoplasm of unspecified part of left bronchus or lung: Secondary | ICD-10-CM | POA: Diagnosis not present

## 2022-01-02 DIAGNOSIS — D630 Anemia in neoplastic disease: Secondary | ICD-10-CM | POA: Diagnosis not present

## 2022-01-10 ENCOUNTER — Other Ambulatory Visit (HOSPITAL_COMMUNITY): Payer: Self-pay

## 2022-01-12 ENCOUNTER — Telehealth: Payer: Self-pay

## 2022-01-12 ENCOUNTER — Other Ambulatory Visit (HOSPITAL_COMMUNITY): Payer: Self-pay

## 2022-01-12 DIAGNOSIS — D630 Anemia in neoplastic disease: Secondary | ICD-10-CM | POA: Diagnosis not present

## 2022-01-12 DIAGNOSIS — Z483 Aftercare following surgery for neoplasm: Secondary | ICD-10-CM | POA: Diagnosis not present

## 2022-01-12 DIAGNOSIS — Z432 Encounter for attention to ileostomy: Secondary | ICD-10-CM | POA: Diagnosis not present

## 2022-01-12 DIAGNOSIS — C3492 Malignant neoplasm of unspecified part of left bronchus or lung: Secondary | ICD-10-CM | POA: Diagnosis not present

## 2022-01-12 DIAGNOSIS — C49A5 Gastrointestinal stromal tumor of rectum: Secondary | ICD-10-CM | POA: Diagnosis not present

## 2022-01-12 NOTE — Patient Outreach (Signed)
  Care Coordination [facility] Note Transition Care Management Follow-up Telephone Call Date of discharge and from where: 12/18/21-[place] [facility]  How have you been since you were released from the hospital? Patient states he is recovering and doing well. [place] services will be discharging him today due to his progress. He has pain to rectal areas which is to be expected. It is managed with current pain regimen. Denies any GI issues/sxs. Patient reports regular BMs Any questions or concerns? No  Items Reviewed: Did the pt receive and understand the discharge instructions provided? Yes  Medications obtained and verified? Yes  Other? No  Any new allergies since your discharge? No  Dietary orders reviewed? Yes-high fiber Do you have support at home? Yes   Home Care and Equipment/Supplies: Were home health services ordered? yes If so, what is the name of the agency? [facility]  Has the agency set up a time to come to the patient's home? yes Were any new equipment or medical supplies ordered?  Yes-ostomy supplies What is the name of the medical supply agency? N/A Were you able to get the supplies/equipment? yes Do you have any questions related to the use of the equipment or supplies? No  Functional Questionnaire: (I = Independent and D = Dependent) ADLs: I  Bathing/Dressing- I  Meal Prep- I  Eating- I  Maintaining continence- I  Transferring/Ambulation- I  Managing Meds- I  Follow up appointments reviewed:  PCP Hospital f/u appt confirmed?  Patient reports not on d/c summary to follow up with PCP so did not make an appt   Specialist Hospital f/u appt confirmed?  Appt completed-saw Dr. Johney Maine . Are transportation arrangements needed? No  If their condition worsens, is the pt aware to call PCP or go to the Emergency Dept.? Yes Was the patient provided with contact information for the PCP's office or ED? Yes Was to pt encouraged to call back with questions or concerns? Yes  SDOH  assessments and interventions completed:   Yes  Care Coordination Interventions Activated:  No   Care Coordination Interventions:  No Care Coordination interventions needed at this time.   Encounter Outcome:  Pt. Visit Completed    Enzo Montgomery, RN,BSN,CCM [place] Management Telephonic Care Management Coordinator Direct Phone: [phone] Toll Free: [phone] Fax: [phone]

## 2022-01-15 ENCOUNTER — Other Ambulatory Visit (HOSPITAL_COMMUNITY): Payer: Self-pay

## 2022-01-15 ENCOUNTER — Telehealth: Payer: Self-pay | Admitting: Pharmacist

## 2022-01-15 NOTE — Chronic Care Management (AMB) (Signed)
    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 No medication changes. Follow up in 6 weeks.   Hospital visits:  Admitted to [facility] on 12/14/2021 due to malignant gastrointestinal stromal tumor of rectum. Discharge date was 12/18/2021.    New?Medications Started at [facility] Discharge:?? traMADol Veatrice Bourbon) Medication Changes at Hospital Discharge: imatinib (GLEEVEC) Medications Discontinued at Hospital Discharge: VITAMIN D PO Medications that remain the same after Hospital Discharge:??  -All other medications will remain the same.    Medications: Outpatient Encounter Medications as of 01/15/2022  Medication Sig   amLODipine (NORVASC) 5 MG tablet Take 1 tablet (5 mg total) by mouth daily.   atorvastatin (LIPITOR) 40 MG tablet Take 1/2 tablet (20 mg total) by mouth daily.   Cholecalciferol (VITAMIN D3 PO) Take 1 capsule by mouth daily.   ibuprofen (ADVIL) 200 MG tablet Take 600 mg by mouth every 6 (six) hours as needed for moderate pain. (Patient not taking: Reported on 01/12/2022)   imatinib (GLEEVEC) 400 MG tablet Take 1 tablet (400 mg total) by mouth daily. Take with meals and large glass of water.Caution:Chemotherapy.   Multiple Vitamins-Minerals (PRESERVISION AREDS 2) CAPS Take 1 capsule by mouth 2 (two) times daily.   omeprazole (PRILOSEC) 40 MG capsule Take 1 capsule (40 mg total) by mouth daily.   traMADol (ULTRAM) 50 MG tablet Take 1-2 tablets (50-100 mg total) by mouth every 6 (six) hours as needed for moderate pain.   No facility-administered encounter medications on file as of 01/15/2022.    Notes: Spoke with patient, he states he is doing very good, healing well, just a little tender at times. He states he is adjusting fairly well to his iliostomy.   Care Gaps: AWV - completed 07/18/2021 Last BP - 136/72 on 10/25/2021 Covid - overdue Flu - due  Star Rating Drugs: Atorvastatin 40mg  - last filled 11/18/2021 90 DS at [place] Pharmacist Assistant [phone]

## 2022-01-19 ENCOUNTER — Ambulatory Visit (HOSPITAL_COMMUNITY)
Admission: RE | Admit: 2022-01-19 | Discharge: 2022-01-19 | Disposition: A | Discharge status: Home / Self Care | Payer: Medicare Other | Source: Ambulatory Visit | Attending: Internal Medicine | Admitting: Internal Medicine

## 2022-01-19 ENCOUNTER — Inpatient Hospital Stay: Payer: Medicare Other | Attending: Internal Medicine

## 2022-01-19 ENCOUNTER — Encounter (HOSPITAL_COMMUNITY): Payer: Self-pay

## 2022-01-19 DIAGNOSIS — C3490 Malignant neoplasm of unspecified part of unspecified bronchus or lung: Secondary | ICD-10-CM | POA: Insufficient documentation

## 2022-01-19 DIAGNOSIS — R911 Solitary pulmonary nodule: Secondary | ICD-10-CM | POA: Diagnosis not present

## 2022-01-19 DIAGNOSIS — Z85118 Personal history of other malignant neoplasm of bronchus and lung: Secondary | ICD-10-CM | POA: Diagnosis present

## 2022-01-19 DIAGNOSIS — Z79899 Other long term (current) drug therapy: Secondary | ICD-10-CM | POA: Insufficient documentation

## 2022-01-19 DIAGNOSIS — Z902 Acquired absence of lung [part of]: Secondary | ICD-10-CM | POA: Diagnosis not present

## 2022-01-19 DIAGNOSIS — R918 Other nonspecific abnormal finding of lung field: Secondary | ICD-10-CM | POA: Diagnosis not present

## 2022-01-19 DIAGNOSIS — C49A9 Gastrointestinal stromal tumor of other sites: Secondary | ICD-10-CM | POA: Diagnosis not present

## 2022-01-19 DIAGNOSIS — Z923 Personal history of irradiation: Secondary | ICD-10-CM | POA: Diagnosis not present

## 2022-01-19 DIAGNOSIS — Z9221 Personal history of antineoplastic chemotherapy: Secondary | ICD-10-CM | POA: Diagnosis not present

## 2022-01-19 DIAGNOSIS — Z87891 Personal history of nicotine dependence: Secondary | ICD-10-CM | POA: Diagnosis not present

## 2022-01-19 DIAGNOSIS — R0602 Shortness of breath: Secondary | ICD-10-CM | POA: Diagnosis not present

## 2022-01-19 DIAGNOSIS — J439 Emphysema, unspecified: Secondary | ICD-10-CM | POA: Diagnosis not present

## 2022-01-19 LAB — CMP (CANCER CENTER ONLY)
ALT: 11 U/L (ref 0–44)
AST: 16 U/L (ref 15–41)
Albumin: 4.5 g/dL (ref 3.5–5.0)
Alkaline Phosphatase: 91 U/L (ref 38–126)
Anion gap: 7 (ref 5–15)
BUN: 31 mg/dL — ABNORMAL HIGH (ref 8–23)
CO2: 24 mmol/L (ref 22–32)
Calcium: 9.7 mg/dL (ref 8.9–10.3)
Chloride: 106 mmol/L (ref 98–111)
Creatinine: 1.12 mg/dL (ref 0.61–1.24)
GFR, Estimated: >60 mL/min (ref >60)
Glucose, Bld: 121 mg/dL — ABNORMAL HIGH (ref 70–99)
Potassium: 4.5 mmol/L (ref 3.5–5.1)
Sodium: 137 mmol/L (ref 135–145)
Total Bilirubin: 0.6 mg/dL (ref 0.3–1.2)
Total Protein: 7.6 g/dL (ref 6.5–8.1)

## 2022-01-19 LAB — CBC WITH DIFFERENTIAL (CANCER CENTER ONLY)
Abs Immature Granulocytes: 0.02 10*3/uL (ref 0.00–0.07)
Basophils Absolute: 0 10*3/uL (ref 0.0–0.1)
Basophils Relative: 0 %
Eosinophils Absolute: 0.1 10*3/uL (ref 0.0–0.5)
Eosinophils Relative: 2 %
HCT: 34.5 % — ABNORMAL LOW (ref 39.0–52.0)
Hemoglobin: 12.1 g/dL — ABNORMAL LOW (ref 13.0–17.0)
Immature Granulocytes: 0 %
Lymphocytes Relative: 25 %
Lymphs Abs: 1.5 10*3/uL (ref 0.7–4.0)
MCH: 33.5 pg (ref 26.0–34.0)
MCHC: 35.1 g/dL (ref 30.0–36.0)
MCV: 95.6 fL (ref 80.0–100.0)
Monocytes Absolute: 0.3 10*3/uL (ref 0.1–1.0)
Monocytes Relative: 5 %
Neutro Abs: 4.3 10*3/uL (ref 1.7–7.7)
Neutrophils Relative %: 68 %
Platelet Count: 196 10*3/uL (ref 150–400)
RBC: 3.61 MIL/uL — ABNORMAL LOW (ref 4.22–5.81)
RDW: 13 % (ref 11.5–15.5)
WBC Count: 6.2 10*3/uL (ref 4.0–10.5)
nRBC: 0 % (ref 0.0–0.2)

## 2022-01-19 MED ORDER — IOHEXOL 300 MG/ML  SOLN
75.0000 mL | Freq: Once | INTRAMUSCULAR | Status: AC | PRN
  Administered 2022-01-19: 75 mL via INTRAVENOUS

## 2022-01-19 MED ORDER — SODIUM CHLORIDE (PF) 0.9 % IJ SOLN
INTRAMUSCULAR | Status: AC
  Filled 2022-01-19: qty 50

## 2022-01-22 ENCOUNTER — Inpatient Hospital Stay: Payer: Medicare Other | Admitting: Internal Medicine

## 2022-01-22 VITALS — BP 150/90 | HR 90 | Temp 97.7°F | Resp 16 | Wt 153.7 lb

## 2022-01-22 DIAGNOSIS — C49A5 Gastrointestinal stromal tumor of rectum: Secondary | ICD-10-CM | POA: Diagnosis not present

## 2022-01-22 DIAGNOSIS — Z79899 Other long term (current) drug therapy: Secondary | ICD-10-CM | POA: Diagnosis not present

## 2022-01-22 DIAGNOSIS — C3490 Malignant neoplasm of unspecified part of unspecified bronchus or lung: Secondary | ICD-10-CM

## 2022-01-22 DIAGNOSIS — C49A9 Gastrointestinal stromal tumor of other sites: Secondary | ICD-10-CM | POA: Diagnosis not present

## 2022-01-22 DIAGNOSIS — Z9221 Personal history of antineoplastic chemotherapy: Secondary | ICD-10-CM | POA: Diagnosis not present

## 2022-01-22 DIAGNOSIS — Z902 Acquired absence of lung [part of]: Secondary | ICD-10-CM | POA: Diagnosis not present

## 2022-01-22 DIAGNOSIS — Z923 Personal history of irradiation: Secondary | ICD-10-CM | POA: Diagnosis not present

## 2022-01-22 DIAGNOSIS — Z87891 Personal history of nicotine dependence: Secondary | ICD-10-CM | POA: Diagnosis not present

## 2022-01-22 DIAGNOSIS — R918 Other nonspecific abnormal finding of lung field: Secondary | ICD-10-CM | POA: Diagnosis not present

## 2022-01-22 MED ORDER — IMATINIB MESYLATE 400 MG PO TABS: 400.0000 mg | ORAL_TABLET | Freq: Every day | ORAL | 1 refills | Status: DC

## 2022-01-22 NOTE — Progress Notes (Signed)
[place] Telephone:([phone]) [phone]   Fax:(336) [phone]  OFFICE PROGRESS NOTE  [name], MD [place] [address]  DIAGNOSIS:  1) Stage IIB (T1 a, N1, M0) non-small cell lung cancer, adenocarcinoma diagnosed in June 2021. 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  Biomarker Findings Tumor Mutational Burden - 20 Muts/Mb Microsatellite status - MS-Stable Genomic Findings For a complete list of the genes assayed, please refer to the Appendix. KEAP1 I441f*17 [id number] RB1 splice site [id number]+6N>[id number]* 8 Disease relevant genes with no reportable alterations: ALK, BRAF, EGFR, ERBB2, KRAS, MET, RET, ROS1  PDL1 Expression: 1 %  PRIOR THERAPY:  1) status post left upper lobectomy with lymph node dissection on December 16, 2019 under the care of Dr. LKipp Brood  The tumor size measured 0.9 cm but there was involvement of the level 10 L and 11 L.  2) Adjuvant systemic chemotherapy with cisplatin 75 mg/M2 and Alimta 500 mg/M2 every 3 weeks.  First dose February 04, 2020.  Status post 4 cycles. 3) Neoadjuvant treatment with imatinib 400 mg p.o. daily for the GIST tumor of the pelvic area.  First dose started July 18, 2020.  Status post 12 months of treatment. 4) status post robotic assisted very low anterior rectosigmoid resection with coloanal anastomosis, diverting loop ileostomy and wedge liver biopsy under the care of Dr. GJohney Maineon December 14, 2021 and it showed minimal residual gastrointestinal stromal tumor.  CURRENT THERAPY: Imatinib 400 mg p.o. daily  INTERVAL HISTORY: Jesse MCLINDEN71y.o. male returns to the clinic today for follow-up visit.  The patient is feeling fine today with no concerning complaints.  He lost around 20 pounds since his surgery because of lack of appetite.  He underwent very low anterior rectosigmoid resection with coloanal anastomosis and diverting loop ileostomy in which liver biopsy under the care  of Dr. GJohney Maineon December 14, 2021 and there was minimal residual gastrointestinal stromal tumor.  The patient started treatment with imatinib again and has been tolerating it fairly well after the surgery.  He denied having any current chest pain, shortness of breath, cough or hemoptysis.  He has no nausea, vomiting, diarrhea or constipation.  He has no headache or visual changes.  He is here today for evaluation with repeat CT scan of the chest for restaging of his lung cancer.   MEDICAL HISTORY: Past Medical History:  Diagnosis Date   Anemia    [place]    bladder cancer   [place]    mild    DIVERTICULOSIS, COLON 01/14/2007   Qualifier: Diagnosis of  By: MPaulina FusiRN, JDaine Gravel   GASTRITIS, CHRONIC 06/01/2004   Qualifier: Diagnosis of  By: SNolon RodCMA (AJuarez, Robin     GERD 01/09/2007   Qualifier: Diagnosis of  By: TSherren Mocha RN, Ellen     Heart murmur    as a child; no murmur heard 12/14/19   HYPERLIPIDEMIA 01/09/2007   Qualifier: Diagnosis of  By: TScherrie Gerlach    Hypertension    Lung mass    left upper nodule   Macular degeneration    right eye   NEOPLASM, MALIGNANT, BLADDER, HX OF 2002   Qualifier: Diagnosis of  By: SNolon RodCMA (AAMA), Robin  / no chemo or radiation   OSTEOARTHRITIS 01/14/2007   Qualifier: Diagnosis of  By: MPaulina FusiRN, JDaine Gravel- knees   Rectal mass  Reflux esophagitis 06/01/2004   Qualifier: Diagnosis of  By: Nolon Rod CMA (AAMA), Robin     TOBACCO USER 02/16/2009   Qualifier: Diagnosis of  By: Burnice Logan  MD, Doretha Sou    VITAMIN D DEFICIENCY 06/03/2007   Qualifier: Diagnosis of  By: Burnice Logan  MD, Doretha Sou     ALLERGIES:  has No Known Allergies.  MEDICATIONS:  Current Outpatient Medications  Medication Sig Dispense Refill   amLODipine (NORVASC) 5 MG tablet Take 1 tablet (5 mg total) by mouth daily. 90 tablet 3   atorvastatin (LIPITOR) 40 MG tablet Take 1/2 tablet (20 mg total) by mouth daily. 90 tablet  3   Cholecalciferol (VITAMIN D3 PO) Take 1 capsule by mouth daily.     ibuprofen (ADVIL) 200 MG tablet Take 600 mg by mouth every 6 (six) hours as needed for moderate pain. (Patient not taking: Reported on 01/12/2022)     imatinib (GLEEVEC) 400 MG tablet Take 1 tablet (400 mg total) by mouth daily. Take with meals and large glass of water.Caution:Chemotherapy. 30 tablet 3   Multiple Vitamins-Minerals (PRESERVISION AREDS 2) CAPS Take 1 capsule by mouth 2 (two) times daily.     omeprazole (PRILOSEC) 40 MG capsule Take 1 capsule (40 mg total) by mouth daily. 90 capsule 3   traMADol (ULTRAM) 50 MG tablet Take 1-2 tablets (50-100 mg total) by mouth every 6 (six) hours as needed for moderate pain. 20 tablet 0   No current facility-administered medications for this visit.    SURGICAL HISTORY:  Past Surgical History:  Procedure Laterality Date   BRONCHIAL BIOPSY  10/13/2019   Procedure: BRONCHIAL BIOPSIES;  Surgeon: Garner Nash, DO;  Location: [facility];  Service: Pulmonary;;   BRONCHIAL BRUSHINGS  10/13/2019   Procedure: BRONCHIAL BRUSHINGS;  Surgeon: Garner Nash, DO;  Location: [facility];  Service: Pulmonary;;   BRONCHIAL NEEDLE ASPIRATION BIOPSY  10/13/2019   Procedure: BRONCHIAL NEEDLE ASPIRATION BIOPSIES;  Surgeon: Garner Nash, DO;  Location: [facility];  Service: Pulmonary;;   BRONCHIAL WASHINGS  10/13/2019   Procedure: BRONCHIAL WASHINGS;  Surgeon: Garner Nash, DO;  Location: [facility];  Service: Pulmonary;;   COLONOSCOPY     multiple   DIVERTING ILEOSTOMY N/A 12/14/2021   Procedure: DIVERTING ILEOSTOMY;  Surgeon: Michael Boston, MD;  Location: [facility];  Service: General;  Laterality: N/A;   INTERCOSTAL NERVE BLOCK Left 12/16/2019   Procedure: INTERCOSTAL NERVE BLOCK;  Surgeon: Lajuana Matte, MD;  Location: [facility];  Service: Thoracic;  Laterality: Left;   KNEE SURGERY  07/20/2008   bilat.   NASAL SEPTUM SURGERY  1995   NODE DISSECTION Left 12/16/2019    Procedure: NODE DISSECTION;  Surgeon: Lajuana Matte, MD;  Location: [facility];  Service: Thoracic;  Laterality: Left;   TONSILLECTOMY     TRANSURETHRAL RESECTION OF BLADDER TUMOR  2002   UPPER GASTROINTESTINAL ENDOSCOPY     VIDEO BRONCHOSCOPY WITH ENDOBRONCHIAL NAVIGATION N/A 10/13/2019   Procedure: VIDEO BRONCHOSCOPY WITH ENDOBRONCHIAL NAVIGATION;  Surgeon: Garner Nash, DO;  Location: [facility];  Service: Pulmonary;  Laterality: N/A;   WISDOM TOOTH EXTRACTION     XI ROBOTIC ASSISTED LOWER ANTERIOR RESECTION N/A 12/14/2021   Procedure: XI ROBOTIC ASSISTED LOWER ANTERIOR ULTRA LOW RECTOSIGMOID RESECTION, COLOANAL HAND SEWN ANASTOMOSIS, AND BILATERAL TAP BLOCK;  Surgeon: Michael Boston, MD;  Location: [facility];  Service: General;  Laterality: N/A;    REVIEW OF SYSTEMS:  Constitutional: positive for anorexia, fatigue, and weight loss Eyes: negative Ears,  nose, mouth, throat, and face: negative Respiratory: negative Cardiovascular: negative Gastrointestinal: negative Genitourinary:negative Integument/breast: negative Hematologic/lymphatic: negative Musculoskeletal:positive for muscle weakness Neurological: negative Behavioral/Psych: negative Endocrine: negative Allergic/Immunologic: negative   PHYSICAL EXAMINATION: General appearance: alert, cooperative, fatigued, and no distress Head: Normocephalic, without obvious abnormality, atraumatic Neck: no adenopathy, no JVD, supple, symmetrical, trachea midline, and thyroid not enlarged, symmetric, no tenderness/mass/nodules Lymph nodes: Cervical, supraclavicular, and axillary nodes normal. Resp: clear to auscultation bilaterally Back: symmetric, no curvature. ROM normal. No CVA tenderness. Cardio: regular rate and rhythm, S1, S2 normal, no murmur, click, rub or gallop GI: soft, non-tender; bowel sounds normal; no masses,  no organomegaly Extremities: extremities normal, atraumatic, no cyanosis or edema Neurologic: Alert and oriented X  3, normal strength and tone. Normal symmetric reflexes. Normal coordination and gait  ECOG PERFORMANCE STATUS: 1 - Symptomatic but completely ambulatory  Blood pressure (!) 150/90, pulse 90, temperature 97.7 F (36.5 C), temperature source Oral, resp. rate 16, weight 153 lb 11.2 oz (69.7 kg), SpO2 99 %.  LABORATORY DATA: Lab Results  Component Value Date   WBC 6.2 01/19/2022   HGB 12.1 (L) 01/19/2022   HCT 34.5 (L) 01/19/2022   MCV 95.6 01/19/2022   PLT 196 01/19/2022      Chemistry      Component Value Date/Time   NA 137 01/19/2022 0931   K 4.5 01/19/2022 0931   CL 106 01/19/2022 0931   CO2 24 01/19/2022 0931   BUN 31 (H) 01/19/2022 0931   CREATININE 1.12 01/19/2022 0931      Component Value Date/Time   CALCIUM 9.7 01/19/2022 0931   ALKPHOS 91 01/19/2022 0931   AST 16 01/19/2022 0931   ALT 11 01/19/2022 0931   BILITOT 0.6 01/19/2022 0931       RADIOGRAPHIC STUDIES: CT Chest W Contrast  Result Date: 01/21/2022 CLINICAL DATA:  71 year old male with history of non-small cell lung cancer and history of GI stromal tumor (GIST) status post left upper lobectomy, chemotherapy and radiation therapy now complete, with oral chemotherapy in progress. Shortness of breath on exertion. Follow-up study. * Tracking Code: BO * EXAM: CT CHEST WITH CONTRAST TECHNIQUE: Multidetector CT imaging of the chest was performed during intravenous contrast administration. RADIATION DOSE REDUCTION: This exam was performed according to the departmental dose-optimization program which includes automated exposure control, adjustment of the mA and/or kV according to patient size and/or use of iterative reconstruction technique. CONTRAST:  52m OMNIPAQUE IOHEXOL 300 MG/ML  SOLN COMPARISON:  Chest CT 07/17/2021. FINDINGS: Cardiovascular: Heart size is normal. There is no significant pericardial fluid, thickening or pericardial calcification. There is aortic atherosclerosis, as well as atherosclerosis of the  great vessels of the mediastinum and the coronary arteries, including calcified atherosclerotic plaque in the left main, left anterior descending, left circumflex and right coronary arteries. Thickening and calcification of the aortic valve. Ectasia of ascending thoracic aorta (4.2 cm in diameter). Mediastinum/Nodes: No pathologically enlarged mediastinal lymph nodes. However, there has been slow growth of a borderline enlarged left hilar lymph node (axial image 58 of series 2) which currently measures 1.3 cm in short axis. Prominent nodal tissue also noted in the left hilar region on axial image 74 of series 2 measuring 11 mm in short axis. Esophagus is unremarkable in appearance. No axillary lymphadenopathy. Lungs/Pleura: Status post left upper lobectomy. Compensatory hyperexpansion of the left lower lobe. Thin-walled cystic lesion with internal septations in the right lower lobe (axial image 122 of series 7) measuring up to 1.5 cm in diameter  with multiple small satellite nodules adjacent to the lesion, largest of which measures 6 mm (axial image 127 of series 7) slightly more conspicuous than prior examinations. Several other small pulmonary nodules are noted in the lungs, largest of which is near the apex of the right upper lobe (axial image 25 of series 7) measuring 6 mm (unchanged). Mild diffuse bronchial wall thickening with mild centrilobular and paraseptal emphysema. Upper Abdomen: Aortic atherosclerosis. Musculoskeletal: There are no aggressive appearing lytic or blastic lesions noted in the visualized portions of the skeleton. IMPRESSION: 1. Increased conspicuity of soft tissue in the left hilar region concerning for possible developing lymphadenopathy. Close attention to this region on follow-up imaging is recommended. Alternatively, if there is clinical concern for recurrent disease, further evaluation with PET-CT could be considered. 2. In addition, there is a mixed cystic and solid lesion in the  periphery of the right lower lobe with slight enlargement of the small nodular components, which currently measure up to 6 mm. The possibility of a slow growing neoplasm such as an adenocarcinoma in this region is not excluded. Close attention on follow-up studies both of these regions is strongly recommended. 3. Mild diffuse bronchial wall thickening with mild centrilobular and paraseptal emphysema; imaging findings suggestive of underlying COPD. 4. Aortic atherosclerosis, in addition to left main and three-vessel coronary artery disease. There is also ectasia of the ascending thoracic aorta (4.2 cm in diameter). Attention at time of routine follow-up imaging is recommended to ensure stability. 5. There are calcifications of the aortic valve. Echocardiographic correlation for evaluation of potential valvular dysfunction may be warranted if clinically indicated. These results will be called to the ordering clinician or representative by the Radiologist Assistant, and communication documented in the PACS or [employer]. Aortic Atherosclerosis (ICD10-I70.0) and Emphysema (ICD10-J43.9). Electronically Signed   By: Vinnie Langton M.D.   On: 01/21/2022 09:58    ASSESSMENT AND PLAN: This is a very pleasant 71 years old white male recently diagnosed with a stage IIb (T1 a, N1, M0) non-small cell lung cancer, adenocarcinoma in June 2021 status post left upper lobectomy with lymph node dissection on December 16, 2019 under the care of Dr. Kipp Brood.  The patient has no actionable mutations and PD-L1 expression was 1%. He was also diagnosed with pelvic GIST tumor. The patient completed a course of adjuvant systemic chemotherapy with cisplatin 75 mg/M2 and Alimta 500 mg/M2 every 3 weeks status post 4 cycles.  He tolerated his treatment well except for fatigue and occasional nausea. For the pelvic GIST tumor, the patient completed neoadjuvant treatment with imatinib 400 mg p.o. daily in April 2023.  Status post 18  months.   The patient underwent surgical resection of the remaining gastrointestinal stromal tumor under the care of Dr. Johney Maine on December 14, 2021 with very minimal residual disease.  He resumed his treatment with imatinib and tolerating it fairly well. The patient is here today for evaluation with repeat CT scan of the chest for restaging of his lung cancer.  I personally and independently reviewed the scan results and discussed it with the patient and showed him the images of the scan.  There was increased density of soft tissue in the left hilar region concerning for possible developing lymphadenopathy or disease recurrence. I recommended for the patient to have a PET scan for further evaluation of this lesion and to rule out any disease recurrence in the lung. I will see him back for follow-up visit in 2 weeks for evaluation and discussion  of the PET scan results and any further recommendation regarding this abnormality. For the GIST, the patient will continue on his treatment with imatinib 400 mg p.o. daily for now. He was advised to call immediately if he has any other concerning symptoms in the interval.  The patient voices understanding of current disease status and treatment options and is in agreement with the current care plan.  All questions were answered. The patient knows to call the clinic with any problems, questions or concerns. We can certainly see the patient much sooner if necessary. The total time spent in the appointment was 30 minutes.  Disclaimer: This note was dictated with voice recognition software. Similar sounding words can inadvertently be transcribed and may not be corrected upon review.

## 2022-01-24 ENCOUNTER — Telehealth: Payer: Self-pay | Admitting: Internal Medicine

## 2022-01-24 NOTE — Telephone Encounter (Signed)
Called patient regarding upcoming October appointment, patient is notified.   

## 2022-02-02 ENCOUNTER — Ambulatory Visit (HOSPITAL_COMMUNITY)
Admission: RE | Admit: 2022-02-02 | Discharge: 2022-02-02 | Disposition: A | Discharge status: Home / Self Care | Payer: Medicare Other | Source: Ambulatory Visit | Attending: Internal Medicine | Admitting: Internal Medicine

## 2022-02-02 DIAGNOSIS — I77810 Thoracic aortic ectasia: Secondary | ICD-10-CM | POA: Diagnosis not present

## 2022-02-02 DIAGNOSIS — C3490 Malignant neoplasm of unspecified part of unspecified bronchus or lung: Secondary | ICD-10-CM | POA: Insufficient documentation

## 2022-02-02 DIAGNOSIS — E041 Nontoxic single thyroid nodule: Secondary | ICD-10-CM | POA: Diagnosis not present

## 2022-02-02 DIAGNOSIS — C3412 Malignant neoplasm of upper lobe, left bronchus or lung: Secondary | ICD-10-CM | POA: Diagnosis not present

## 2022-02-02 DIAGNOSIS — I2510 Atherosclerotic heart disease of native coronary artery without angina pectoris: Secondary | ICD-10-CM | POA: Diagnosis not present

## 2022-02-02 LAB — GLUCOSE, CAPILLARY: Glucose-Capillary: 118 mg/dL — ABNORMAL HIGH (ref 70–99)

## 2022-02-02 MED ORDER — FLUDEOXYGLUCOSE F - 18 (FDG) INJECTION
7.5000 | Freq: Once | INTRAVENOUS | Status: AC | PRN
  Administered 2022-02-02: 7.5 via INTRAVENOUS

## 2022-02-05 ENCOUNTER — Encounter: Payer: Self-pay | Admitting: General Practice

## 2022-02-05 ENCOUNTER — Inpatient Hospital Stay (HOSPITAL_BASED_OUTPATIENT_CLINIC_OR_DEPARTMENT_OTHER): Payer: Medicare Other | Admitting: Internal Medicine

## 2022-02-05 VITALS — BP 125/70 | HR 80 | Temp 97.6°F | Resp 16 | Ht 69.0 in | Wt 149.2 lb

## 2022-02-05 DIAGNOSIS — Z87891 Personal history of nicotine dependence: Secondary | ICD-10-CM | POA: Diagnosis not present

## 2022-02-05 DIAGNOSIS — C49A9 Gastrointestinal stromal tumor of other sites: Secondary | ICD-10-CM | POA: Diagnosis not present

## 2022-02-05 DIAGNOSIS — Z5111 Encounter for antineoplastic chemotherapy: Secondary | ICD-10-CM | POA: Diagnosis not present

## 2022-02-05 DIAGNOSIS — C3492 Malignant neoplasm of unspecified part of left bronchus or lung: Secondary | ICD-10-CM | POA: Diagnosis not present

## 2022-02-05 DIAGNOSIS — Z923 Personal history of irradiation: Secondary | ICD-10-CM | POA: Diagnosis not present

## 2022-02-05 DIAGNOSIS — R918 Other nonspecific abnormal finding of lung field: Secondary | ICD-10-CM | POA: Diagnosis not present

## 2022-02-05 DIAGNOSIS — Z79899 Other long term (current) drug therapy: Secondary | ICD-10-CM | POA: Diagnosis not present

## 2022-02-05 DIAGNOSIS — Z902 Acquired absence of lung [part of]: Secondary | ICD-10-CM | POA: Diagnosis not present

## 2022-02-05 DIAGNOSIS — C3490 Malignant neoplasm of unspecified part of unspecified bronchus or lung: Secondary | ICD-10-CM | POA: Diagnosis not present

## 2022-02-05 DIAGNOSIS — Z9221 Personal history of antineoplastic chemotherapy: Secondary | ICD-10-CM | POA: Diagnosis not present

## 2022-02-05 MED ORDER — METHYLPREDNISOLONE 4 MG PO TBPK: ORAL_TABLET | ORAL | 0 refills | Status: DC

## 2022-02-05 NOTE — Progress Notes (Signed)
[facility] [phone]   [phone]  OFFICE PROGRESS NOTE  Jesse Ruddy, MD [place] [address]  DIAGNOSIS:  1) Stage IIB (T1 a, N1, M0) non-small cell lung cancer, adenocarcinoma diagnosed in June 2021. 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  Biomarker Findings Tumor Mutational Burden - 20 Muts/Mb Microsatellite status - MS-Stable Genomic Findings For a complete list of the genes assayed, please refer to the Appendix. KEAP1 I452f*17 [id number] RB1 splice site [id number]* 8 Disease relevant genes with no reportable alterations: ALK, BRAF, EGFR, ERBB2, KRAS, MET, RET, ROS1  PDL1 Expression: 1 %  PRIOR THERAPY:  1) status post left upper lobectomy with lymph node dissection on December 16, 2019 under the care of Dr. LKipp Brood  The tumor size measured 0.9 cm but there was involvement of the level 10 L and 11 L.  2) Adjuvant systemic chemotherapy with cisplatin 75 mg/M2 and Alimta 500 mg/M2 every 3 weeks.  First dose February 04, 2020.  Status post 4 cycles. 3) Neoadjuvant treatment with imatinib 400 mg p.o. daily for the GIST tumor of the pelvic area.  First dose started July 18, 2020.  Status post 12 months of treatment. 4) status post robotic assisted very low anterior rectosigmoid resection with coloanal anastomosis, diverting loop ileostomy and wedge liver biopsy under the care of Dr. GJohney Maineon December 14, 2021 and it showed minimal residual gastrointestinal stromal tumor.  CURRENT THERAPY: Imatinib 400 mg p.o. daily  INTERVAL HISTORY: Jesse [id number]. male returns to the clinic today for follow-up visit.  The patient was accompanied by his wife.  He denied having any chest pain but has shortness of breath with exertion with no cough or hemoptysis.  He has no nausea, vomiting, diarrhea or constipation.  He lost around 25 pounds in the last few months since his surgery.  He denied having  any fever or chills.  He has no headache or visual changes.  He was found on previous CT scan of the chest to have suspicious lymphadenopathy in the mediastinum.  He is here today for evaluation and repeat PET scan for further evaluation of his disease.   MEDICAL HISTORY: Past Medical History:  Diagnosis Date   Anemia    [place]    bladder cancer   COPD (chronic obstructive pulmonary disease) ([facility]    mild    DIVERTICULOSIS, COLON 01/14/2007   Qualifier: Diagnosis of  By: MPaulina FusiRN, JDaine Gravel   GASTRITIS, CHRONIC 06/01/2004   Qualifier: Diagnosis of  By: SNolon RodCMA ([place], Robin     GERD 01/09/2007   Qualifier: Diagnosis of  By: TSherren Mocha RN, Ellen     Heart murmur    as a child; no murmur heard 12/14/19   HYPERLIPIDEMIA 01/09/2007   Qualifier: Diagnosis of  By: TSherren MochaRN, EDorian Pod    Hypertension    Lung mass    left upper nodule   Macular degeneration    right eye   NEOPLASM, MALIGNANT, BLADDER, HX OF 2002   Qualifier: Diagnosis of  By: SNolon RodCMA (AAMA), Robin  / no chemo or radiation   OSTEOARTHRITIS 01/14/2007   Qualifier: Diagnosis of  By: MPaulina Fusi RN, JDaine Gravel- knees   Rectal mass    Reflux esophagitis 06/01/2004   Qualifier: Diagnosis of  By: SNolon RodCMA (AAMA), Robin     TOBACCO USER 02/16/2009   Qualifier: Diagnosis of  By: KBurnice Logan  MD, Doretha Sou    VITAMIN D DEFICIENCY 06/03/2007   Qualifier: Diagnosis of  By: Burnice Logan  MD, Doretha Sou     ALLERGIES:  has No Known Allergies.  MEDICATIONS:  Current Outpatient Medications  Medication Sig Dispense Refill   amLODipine (NORVASC) 5 MG tablet Take 1 tablet (5 mg total) by mouth daily. 90 tablet 3   atorvastatin (LIPITOR) 40 MG tablet Take 1/2 tablet (20 mg total) by mouth daily. 90 tablet 3   Cholecalciferol (VITAMIN D3 PO) Take 1 capsule by mouth daily.     imatinib (GLEEVEC) 400 MG tablet Take 1 tablet (400 mg total) by mouth daily. Take with meals and large glass of water.Caution:Chemotherapy. 90 tablet 1    Multiple Vitamins-Minerals (PRESERVISION AREDS 2) CAPS Take 1 capsule by mouth 2 (two) times daily.     omeprazole (PRILOSEC) 40 MG capsule Take 1 capsule (40 mg total) by mouth daily. 90 capsule 3   traMADol (ULTRAM) 50 MG tablet Take 1-2 tablets (50-100 mg total) by mouth every 6 (six) hours as needed for moderate pain. 20 tablet 0   No current facility-administered medications for this visit.    SURGICAL HISTORY:  Past Surgical History:  Procedure Laterality Date   BRONCHIAL BIOPSY  10/13/2019   Procedure: BRONCHIAL BIOPSIES;  Surgeon: Garner Nash, DO;  Location: [facility];  Service: Pulmonary;;   BRONCHIAL BRUSHINGS  10/13/2019   Procedure: BRONCHIAL BRUSHINGS;  Surgeon: Garner Nash, DO;  Location: [facility];  Service: Pulmonary;;   BRONCHIAL NEEDLE ASPIRATION BIOPSY  10/13/2019   Procedure: BRONCHIAL NEEDLE ASPIRATION BIOPSIES;  Surgeon: Garner Nash, DO;  Location: [facility];  Service: Pulmonary;;   BRONCHIAL WASHINGS  10/13/2019   Procedure: BRONCHIAL WASHINGS;  Surgeon: Garner Nash, DO;  Location: [facility];  Service: Pulmonary;;   COLONOSCOPY     multiple   DIVERTING ILEOSTOMY N/A 12/14/2021   Procedure: DIVERTING ILEOSTOMY;  Surgeon: Michael Boston, MD;  Location: [facility];  Service: General;  Laterality: N/A;   INTERCOSTAL NERVE BLOCK Left 12/16/2019   Procedure: INTERCOSTAL NERVE BLOCK;  Surgeon: Lajuana Matte, MD;  Location: [facility];  Service: Thoracic;  Laterality: Left;   KNEE SURGERY  07/20/2008   bilat.   NASAL SEPTUM SURGERY  1995   NODE DISSECTION Left 12/16/2019   Procedure: NODE DISSECTION;  Surgeon: Lajuana Matte, MD;  Location: [facility];  Service: Thoracic;  Laterality: Left;   TONSILLECTOMY     TRANSURETHRAL RESECTION OF BLADDER TUMOR  2002   UPPER GASTROINTESTINAL ENDOSCOPY     VIDEO BRONCHOSCOPY WITH ENDOBRONCHIAL NAVIGATION N/A 10/13/2019   Procedure: VIDEO BRONCHOSCOPY WITH ENDOBRONCHIAL NAVIGATION;  Surgeon: Garner Nash,  DO;  Location: [facility];  Service: Pulmonary;  Laterality: N/A;   WISDOM TOOTH EXTRACTION     XI ROBOTIC ASSISTED LOWER ANTERIOR RESECTION N/A 12/14/2021   Procedure: XI ROBOTIC ASSISTED LOWER ANTERIOR ULTRA LOW RECTOSIGMOID RESECTION, COLOANAL HAND SEWN ANASTOMOSIS, AND BILATERAL TAP BLOCK;  Surgeon: Michael Boston, MD;  Location: [facility];  Service: General;  Laterality: N/A;    REVIEW OF SYSTEMS:  Constitutional: positive for anorexia, fatigue, and weight loss Eyes: negative Ears, nose, mouth, throat, and face: negative Respiratory: negative Cardiovascular: negative Gastrointestinal: negative Genitourinary:negative Integument/breast: negative Hematologic/lymphatic: negative Musculoskeletal:positive for muscle weakness Neurological: negative Behavioral/Psych: negative Endocrine: negative Allergic/Immunologic: negative   PHYSICAL EXAMINATION: General appearance: alert, cooperative, fatigued, and no distress Head: Normocephalic, without obvious abnormality, atraumatic Neck: no adenopathy, no JVD, supple, symmetrical, trachea midline, and thyroid not  enlarged, symmetric, no tenderness/mass/nodules Lymph nodes: Cervical, supraclavicular, and axillary nodes normal. Resp: clear to auscultation bilaterally Back: symmetric, no curvature. ROM normal. No CVA tenderness. Cardio: regular rate and rhythm, S1, S2 normal, no murmur, click, rub or gallop GI: soft, non-tender; bowel sounds normal; no masses,  no organomegaly Extremities: extremities normal, atraumatic, no cyanosis or edema Neurologic: Alert and oriented X 3, normal strength and tone. Normal symmetric reflexes. Normal coordination and gait  ECOG PERFORMANCE STATUS: 1 - Symptomatic but completely ambulatory  Blood pressure 125/70, pulse 80, temperature 97.6 F (36.4 C), temperature source Oral, resp. rate 16, height '5\' 9"'  (1.753 m), weight 149 lb 4 oz (67.7 kg), SpO2 100 %.  LABORATORY DATA: Lab Results  Component Value Date    WBC 6.2 01/19/2022   HGB 12.1 (L) 01/19/2022   HCT 34.5 (L) 01/19/2022   MCV 95.6 01/19/2022   PLT 196 01/19/2022      Chemistry      Component Value Date/Time   NA 137 01/19/2022 0931   K 4.5 01/19/2022 0931   CL 106 01/19/2022 0931   CO2 24 01/19/2022 0931   BUN 31 (H) 01/19/2022 0931   CREATININE 1.12 01/19/2022 0931      Component Value Date/Time   CALCIUM 9.7 01/19/2022 0931   ALKPHOS 91 01/19/2022 0931   AST 16 01/19/2022 0931   ALT 11 01/19/2022 0931   BILITOT 0.6 01/19/2022 0931       RADIOGRAPHIC STUDIES: NM PET Image Restage (PS) Skull Base to Thigh (F-18 FDG)  Result Date: 02/04/2022 CLINICAL DATA:  Subsequent treatment strategy for stage II non-small cell left upper lobe lung cancer status post left upper lobectomy 12/16/2019, adjuvant chemotherapy. 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 EXAM: NUCLEAR MEDICINE PET SKULL BASE TO THIGH TECHNIQUE: 7.4 mCi F-18 FDG was injected intravenously. Full-ring PET imaging was performed from the skull base to thigh after the radiotracer. CT data was obtained and used for attenuation correction and anatomic localization. Fasting blood glucose: 118 mg/dl COMPARISON:  01/19/2022 chest CT. 03/24/2021 CT chest, abdomen and pelvis. 07/17/2021 MRI pelvis. 09/16/2019 PET-CT. FINDINGS: Mediastinal blood pool activity: SUV max 3.0 Liver activity: SUV max NA NECK: No hypermetabolic lymph nodes in the neck. Incidental CT findings: Hypodense 1.9 cm left thyroid nodule, non hypermetabolic. CHEST: Multiple new mildly enlarged hypermetabolic left axillary, mediastinal and left hilar lymph nodes. Representative 1.2 cm left axillary node with max SUV 13.9 (series 4/image 68). Representative high right paratracheal 0.8 cm node with max SUV 6.4 (series 4/image 68). Representative 1.0 cm subcarinal node with max SUV 8.3  (series 4/image 80). Representative 1.4 cm left hilar node with max SUV 9.3 (series 4/image 74). No enlarged or hypermetabolic right axillary or right hilar nodes. No hypermetabolic pulmonary findings. Non hypermetabolic (max SUV 1.1) mixed cystic and solid 2.2 cm peripheral right lower lobe pulmonary nodule with solid components measuring up to 0.6 cm, technically below PET resolution. Incidental CT findings: Moderate paraseptal and centrilobular emphysema. Left upper lobectomy. Apical right upper lobe solid 0.6 cm pulmonary nodule is stable since 09/16/2019 PET-CT, below PET resolution and considered benign. Coronary atherosclerosis. Atherosclerotic thoracic aorta with dilated 4.3 cm ascending thoracic aorta. ABDOMEN/PELVIS: No abnormal hypermetabolic activity within the liver, pancreas, right adrenal gland, or spleen. No hypermetabolic lymph nodes in the abdomen or pelvis. Left adrenal 1.4 cm nodule with density 31 HU with mild FDG uptake with max SUV  3.3, stable size since 09/16/2019 PET-CT, most compatible with a benign adenoma. Status post rectosigmoid resection with colo-anal anastomosis and diverting ileostomy in the ventral right abdominal wall. Nonspecific mild FDG uptake at the colo-anal anastomosis with max SUV 4.9, without discrete mass in this location on the CT images, favoring postsurgical change. Incidental CT findings: Atherosclerotic nonaneurysmal abdominal aorta. Bilateral vasectomy clips. SKELETON: No focal hypermetabolic activity to suggest skeletal metastasis. Incidental CT findings: None. IMPRESSION: 1. New hypermetabolic left axillary, mediastinal and left hilar lymphadenopathy compatible with recurrent metastatic lung cancer. 2. No hypermetabolic metastatic disease in the neck, abdomen, pelvis or skeleton. 3. Non-hypermetabolic mixed cystic and solid 2.2 cm peripheral right lower lobe pulmonary nodule with solid components up to 0.6 cm. As described on recent chest CT report, given  progression of this nodule on the CT images, low-grade adenocarcinoma not excluded. Continued chest CT surveillance warranted at a minimum. 4. Nonspecific mild FDG uptake at the colo-anal anastomosis without discrete mass correlate on the CT images, favoring postsurgical change. 5. Hypodense non hypermetabolic 1.9 cm left thyroid nodule. Recommend non-emergent thyroid ultrasound. Reference: J Am Coll Radiol. 2015 Feb;12(2): 143-50 6. Chronic findings include: Aortic Atherosclerosis (ICD10-I70.0) and Emphysema (ICD10-J43.9). Coronary atherosclerosis. Stable small left adrenal adenoma, for which no follow-up is recommended. 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 Recommend annual imaging followup by CTA or MRA. This recommendation follows 2010 ACCF/AHA/AATS/ACR/ASA/SCA/SCAI/SIR/STS/SVM Guidelines for the Diagnosis and Management of Patients with Thoracic Aortic Disease. Circulation. 2010; 121: W967-R916. Aortic aneurysm NOS (ICD10-I71.9). Electronically Signed   By: Ilona Sorrel M.D.   On: 02/04/2022 12:54   CT Chest W Contrast  Result Date: 01/21/2022 CLINICAL DATA:  71 year old male with history of non-small cell lung cancer and history of GI stromal tumor (GIST) status post left upper lobectomy, chemotherapy and radiation therapy now complete, with oral chemotherapy in progress. Shortness of breath on exertion. Follow-up study. * Tracking Code: BO * EXAM: CT CHEST WITH CONTRAST TECHNIQUE: Multidetector CT imaging of the chest was performed during intravenous contrast administration. RADIATION DOSE REDUCTION: This exam was performed according to the departmental dose-optimization program which includes automated exposure control, adjustment of the mA and/or kV according to patient size and/or use of iterative reconstruction technique. CONTRAST:  90m OMNIPAQUE IOHEXOL 300 MG/ML  SOLN COMPARISON:  Chest CT 07/17/2021. FINDINGS: Cardiovascular: Heart size is normal. There is no significant pericardial fluid,  thickening or pericardial calcification. There is aortic atherosclerosis, as well as atherosclerosis of the great vessels of the mediastinum and the coronary arteries, including calcified atherosclerotic plaque in the left main, left anterior descending, left circumflex and right coronary arteries. Thickening and calcification of the aortic valve. Ectasia of ascending thoracic aorta (4.2 cm in diameter). Mediastinum/Nodes: No pathologically enlarged mediastinal lymph nodes. However, there has been slow growth of a borderline enlarged left hilar lymph node (axial image 58 of series 2) which currently measures 1.3 cm in short axis. Prominent nodal tissue also noted in the left hilar region on axial image 74 of series 2 measuring 11 mm in short axis. Esophagus is unremarkable in appearance. No axillary lymphadenopathy. Lungs/Pleura: Status post left upper lobectomy. Compensatory hyperexpansion of the left lower lobe. Thin-walled cystic lesion with internal septations in the right lower lobe (axial image 122 of series 7) measuring up to 1.5 cm in diameter with multiple small satellite nodules adjacent to the lesion, largest of which measures 6 mm (axial image 127 of series 7) slightly more conspicuous than prior examinations. Several other small pulmonary nodules are  noted in the lungs, largest of which is near the apex of the right upper lobe (axial image 25 of series 7) measuring 6 mm (unchanged). Mild diffuse bronchial wall thickening with mild centrilobular and paraseptal emphysema. Upper Abdomen: Aortic atherosclerosis. Musculoskeletal: There are no aggressive appearing lytic or blastic lesions noted in the visualized portions of the skeleton. IMPRESSION: 1. Increased conspicuity of soft tissue in the left hilar region concerning for possible developing lymphadenopathy. Close attention to this region on follow-up imaging is recommended. Alternatively, if there is clinical concern for recurrent disease, further  evaluation with PET-CT could be considered. 2. In addition, there is a mixed cystic and solid lesion in the periphery of the right lower lobe with slight enlargement of the small nodular components, which currently measure up to 6 mm. The possibility of a slow growing neoplasm such as an adenocarcinoma in this region is not excluded. Close attention on follow-up studies both of these regions is strongly recommended. 3. Mild diffuse bronchial wall thickening with mild centrilobular and paraseptal emphysema; imaging findings suggestive of underlying COPD. 4. Aortic atherosclerosis, in addition to left main and three-vessel coronary artery disease. There is also ectasia of the ascending thoracic aorta (4.2 cm in diameter). Attention at time of routine follow-up imaging is recommended to ensure stability. 5. There are calcifications of the aortic valve. Echocardiographic correlation for evaluation of potential valvular dysfunction may be warranted if clinically indicated. These results will be called to the ordering clinician or representative by the Radiologist Assistant, and communication documented in the PACS or [employer]. Aortic Atherosclerosis (ICD10-I70.0) and Emphysema (ICD10-J43.9). Electronically Signed   By: Vinnie Langton M.D.   On: 01/21/2022 09:58    ASSESSMENT AND PLAN: This is a very pleasant 71 years old white male recently diagnosed with a stage IIb (T1 a, N1, M0) non-small cell lung cancer, adenocarcinoma in June 2021 status post left upper lobectomy with lymph node dissection on December 16, 2019 under the care of Dr. Kipp Brood.  The patient has no actionable mutations and PD-L1 expression was 1%. He was also diagnosed with pelvic GIST tumor. The patient completed a course of adjuvant systemic chemotherapy with cisplatin 75 mg/M2 and Alimta 500 mg/M2 every 3 weeks status post 4 cycles.  He tolerated his treatment well except for fatigue and occasional nausea. For the pelvic GIST tumor,  the patient completed neoadjuvant treatment with imatinib 400 mg p.o. daily in April 2023.  Status post 18 months.   The patient underwent surgical resection of the remaining gastrointestinal stromal tumor under the care of Dr. Johney Maine on December 14, 2021 with very minimal residual disease.  He resumed his treatment with imatinib and tolerating it fairly well. Repeat CT scan of the chest for restaging of his lung cancer. There was increased density of soft tissue in the left hilar region concerning for possible developing lymphadenopathy or disease recurrence.  A PET scan was performed recently and that showed new hypermetabolic left axillary, mediastinal and left hilar adenopathy compatible with recurrent metastatic lung cancer no hypermetabolic metastatic disease in the neck, abdomen, pelvis or skeleton. I discussed the scan result and showed the images to the patient and his wife. I recommended for the patient to to undergo ultrasound-guided core biopsy of one of the hypermetabolic left axillary lymph node for confirmation of the tissue diagnosis. I will see him back for follow-up visit in 2 weeks for evaluation and discussion of his treatment options based on the final pathology. For the weight loss, I  will start the patient on Medrol Dosepak. For the GIST, the patient will continue on his treatment with imatinib 400 mg p.o. daily for now. He was advised to call immediately if he has any other concerning symptoms in the interval.  The patient voices understanding of current disease status and treatment options and is in agreement with the current care plan.  All questions were answered. The patient knows to call the clinic with any problems, questions or concerns. We can certainly see the patient much sooner if necessary. The total time spent in the appointment was 35 minutes.  Disclaimer: This note was dictated with voice recognition software. Similar sounding words can inadvertently be transcribed  and may not be corrected upon review.

## 2022-02-05 NOTE — Progress Notes (Unsigned)
Juliet Rude, MD  Allen Kell, NT Approved for left axillary LN core biopsy        Previous Messages    ----- Message -----  From: Allen Kell, NT  Sent: 02/05/2022   9:59 AM EDT  To: [facility] Procedure Requests  Subject: [place] core biopsy Lymph Nodes                     Procedure: [place] Core Biopsy Lymph Nodes   Reason: Adenocarcinoma of left lung, stage 2 ([place])   History: NM PET and CT chest in chart   Provider: Curt Bears   Contact: [phone]

## 2022-02-07 ENCOUNTER — Other Ambulatory Visit (HOSPITAL_COMMUNITY): Payer: Self-pay

## 2022-02-09 ENCOUNTER — Other Ambulatory Visit (HOSPITAL_COMMUNITY): Payer: Self-pay

## 2022-02-12 ENCOUNTER — Other Ambulatory Visit: Payer: Self-pay | Admitting: Internal Medicine

## 2022-02-12 ENCOUNTER — Other Ambulatory Visit (HOSPITAL_COMMUNITY): Payer: Self-pay

## 2022-02-12 DIAGNOSIS — C49A5 Gastrointestinal stromal tumor of rectum: Secondary | ICD-10-CM

## 2022-02-12 MED ORDER — IMATINIB MESYLATE 400 MG PO TABS
400.0000 mg | ORAL_TABLET | Freq: Every day | ORAL | 3 refills | Status: DC
  Filled 2022-02-12: qty 30, 30d supply, fill #0

## 2022-02-16 ENCOUNTER — Other Ambulatory Visit (HOSPITAL_COMMUNITY): Payer: Self-pay | Admitting: Student

## 2022-02-19 ENCOUNTER — Ambulatory Visit: Payer: Self-pay | Admitting: Surgery

## 2022-02-19 ENCOUNTER — Other Ambulatory Visit (HOSPITAL_COMMUNITY): Payer: Self-pay

## 2022-02-20 ENCOUNTER — Ambulatory Visit (HOSPITAL_COMMUNITY)
Admission: RE | Admit: 2022-02-20 | Discharge: 2022-02-20 | Disposition: A | Discharge status: Home / Self Care | Payer: Medicare Other | Source: Ambulatory Visit | Attending: Internal Medicine | Admitting: Internal Medicine

## 2022-02-20 DIAGNOSIS — C3492 Malignant neoplasm of unspecified part of left bronchus or lung: Secondary | ICD-10-CM | POA: Insufficient documentation

## 2022-02-20 DIAGNOSIS — C49A0 Gastrointestinal stromal tumor, unspecified site: Secondary | ICD-10-CM | POA: Diagnosis not present

## 2022-02-20 DIAGNOSIS — C679 Malignant neoplasm of bladder, unspecified: Secondary | ICD-10-CM | POA: Diagnosis not present

## 2022-02-20 DIAGNOSIS — Z85110 Personal history of malignant carcinoid tumor of bronchus and lung: Secondary | ICD-10-CM | POA: Diagnosis not present

## 2022-02-20 DIAGNOSIS — I898 Other specified noninfective disorders of lymphatic vessels and lymph nodes: Secondary | ICD-10-CM | POA: Diagnosis not present

## 2022-02-20 DIAGNOSIS — R590 Localized enlarged lymph nodes: Secondary | ICD-10-CM | POA: Insufficient documentation

## 2022-02-20 DIAGNOSIS — C3490 Malignant neoplasm of unspecified part of unspecified bronchus or lung: Secondary | ICD-10-CM | POA: Diagnosis not present

## 2022-02-20 MED ORDER — LIDOCAINE HCL (PF) 1 % IJ SOLN
INTRAMUSCULAR | Status: AC
  Filled 2022-02-20: qty 30

## 2022-02-20 MED ORDER — LIDOCAINE HCL (PF) 1 % IJ SOLN: 10.0000 mL | Freq: Once | INTRAMUSCULAR | Status: DC

## 2022-02-20 NOTE — Procedures (Signed)
Interventional Radiology Procedure Note  Procedure: US guided core biopsy of left axillary LN  Complications: None  Estimated Blood Loss: None  Recommendations: - DC home   Signed,  Criselda Peaches, MD

## 2022-02-21 NOTE — Progress Notes (Signed)
[facility] OFFICE PROGRESS NOTE  [name], MD [place] [address]  DIAGNOSIS:  1) Stage IIB (T1 a, N1, M0) non-small cell lung cancer, adenocarcinoma diagnosed in June 2021. 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  Biomarker Findings Tumor Mutational Burden - 20 Muts/Mb Microsatellite status - MS-Stable Genomic Findings For a complete list of the genes assayed, please refer to the Appendix. KEAP1 I44f*17 [id number] RB1 splice site [id number]* 8 Disease relevant genes with no reportable alterations: ALK, BRAF, EGFR, ERBB2, KRAS, MET, RET, ROS1   PDL1 Expression: 1 %  PRIOR THERAPY: 1) status post left upper lobectomy with lymph node dissection on December 16, 2019 under the care of Dr. LKipp Brood  The tumor size measured 0.9 cm but there was involvement of the level 10 L and 11 L.  2) Adjuvant systemic chemotherapy with cisplatin 75 mg/M2 and Alimta 500 mg/M2 every 3 weeks.  First dose February 04, 2020.  Status post 4 cycles. 3) Neoadjuvant treatment with imatinib 400 mg p.o. daily for the GIST tumor of the pelvic area.  First dose started July 18, 2020.  Status post 12 months of treatment. 4) status post robotic assisted very low anterior rectosigmoid resection with coloanal anastomosis, diverting loop ileostomy and wedge liver biopsy under the care of Dr. GJohney Maineon December 14, 2021 and it showed minimal residual gastrointestinal stromal tumor.  CURRENT THERAPY: 1)  Imatinib 400 mg p.o. daily   INTERVAL HISTORY: Jesse ABDULLAH71y.o. male returns to the clinic today for follow-up visit accompanied by his wife.  The patient was last seen by Dr. MJulien Nordmannon 01/22/2022.  At that point time, he had a repeat PET scan which is suspicious for disease recurrence with new hypermetabolic left axillary and mediastinal and left hilar lymphadenopathy.  There is also a non-hypermetabolic mixed cystic and solid 2.2 cm peripheral  right lower lobe pulmonary nodule.  Dr. MJulien Nordmannrecommended that the patient undergo an ultrasound-guided biopsy of the left axillary lymph node which was performed by interventional radiology on 02/20/22. I talked to pathology today. They stated there is not a lot of tissue, thus, far, does not show any lung cancer or GIST tumor. He is still working on stains for lymphoma, however, he still would require another biopsy for further testing.   The patient also has a history of a pelvic GIST tumor that was resected by Dr. GJohney Mainein August 2023. He is currently on Imatinib which he is tolerating well. He denied having any current chest pain, cough or hemoptysis. He has stable dyspnea on exertion with walking uphill. He has no nausea, vomiting, diarrhea or constipation. He has some normal soreness sometimes near his ileostomy bag. He is scheduled for a open takedown of ileostomy next month on 12/7 by Dr. GJohney Maine He has no headache or visual changes. He denies fever, chills, night sweats, or unexplained weight loss except he initially lost weight with his GIST tumor but is gaining weight back. He was given a medrol dosepak at his last appointment which helped his appetite and energy.  He is here for a more detailed discussion about his current condition and recommended treatment options.     MEDICAL HISTORY: Past Medical History:  Diagnosis Date   Anemia    Cancer ([facility]    bladder cancer   COPD (chronic obstructive pulmonary disease) ([facility]    mild    DIVERTICULOSIS, COLON 01/14/2007   Qualifier: Diagnosis of  By:  Mondi, RN, [name]    GASTRITIS, CHRONIC 06/01/2004   Qualifier: Diagnosis of  By: Nolon Rod CMA ([place]), Robin     GERD 01/09/2007   Qualifier: Diagnosis of  By: Sherren Mocha, RN, Ellen     Heart murmur    as a child; no murmur heard 12/14/19   HYPERLIPIDEMIA 01/09/2007   Qualifier: Diagnosis of  By: Sherren Mocha RN, Dorian Pod     Hypertension    Lung mass    left upper nodule   Macular degeneration     right eye   NEOPLASM, MALIGNANT, BLADDER, HX OF 2002   Qualifier: Diagnosis of  By: Nolon Rod CMA (AAMA), Robin  / no chemo or radiation   OSTEOARTHRITIS 01/14/2007   Qualifier: Diagnosis of  By: Paulina Fusi, RN, [name] - knees   Rectal mass    Reflux esophagitis 06/01/2004   Qualifier: Diagnosis of  By: Nolon Rod CMA (AAMA), Robin     TOBACCO USER 02/16/2009   Qualifier: Diagnosis of  By: Burnice Logan  MD, Doretha Sou    VITAMIN D DEFICIENCY 06/03/2007   Qualifier: Diagnosis of  By: Burnice Logan  MD, Doretha Sou     ALLERGIES:  has No Known Allergies.  MEDICATIONS:  Current Outpatient Medications  Medication Sig Dispense Refill   acetaminophen (TYLENOL) 500 MG tablet Take 1,000 mg by mouth every 8 (eight) hours as needed for moderate pain.     amLODipine (NORVASC) 5 MG tablet Take 1 tablet (5 mg total) by mouth daily. 90 tablet 3   atorvastatin (LIPITOR) 40 MG tablet Take 1/2 tablet (20 mg total) by mouth daily. 90 tablet 3   Cholecalciferol (VITAMIN D3 PO) Take 1 capsule by mouth daily.     imatinib (GLEEVEC) 400 MG tablet Take 1 tablet (400 mg total) by mouth daily. Take with meals and large glass of water.Caution:Chemotherapy. 30 tablet 3   Multiple Vitamins-Minerals (PRESERVISION AREDS 2) CAPS Take 1 capsule by mouth 2 (two) times daily.     omeprazole (PRILOSEC) 40 MG capsule Take 1 capsule (40 mg total) by mouth daily. 90 capsule 3   OVER THE COUNTER MEDICATION Take 3 tablets by mouth daily.  Balance of Nature Fruits     OVER THE COUNTER MEDICATION Take 3 tablets by mouth daily. Balance of Nature Vegetable     traMADol (ULTRAM) 50 MG tablet Take 1-2 tablets (50-100 mg total) by mouth every 6 (six) hours as needed for moderate pain. (Patient not taking: Reported on 02/05/2022) 20 tablet 0   No current facility-administered medications for this visit.    SURGICAL HISTORY:  Past Surgical History:  Procedure Laterality Date   BRONCHIAL BIOPSY  10/13/2019   Procedure: BRONCHIAL BIOPSIES;   Surgeon: Garner Nash, DO;  Location: [facility];  Service: Pulmonary;;   BRONCHIAL BRUSHINGS  10/13/2019   Procedure: BRONCHIAL BRUSHINGS;  Surgeon: Garner Nash, DO;  Location: [facility];  Service: Pulmonary;;   BRONCHIAL NEEDLE ASPIRATION BIOPSY  10/13/2019   Procedure: BRONCHIAL NEEDLE ASPIRATION BIOPSIES;  Surgeon: Garner Nash, DO;  Location: [facility];  Service: Pulmonary;;   BRONCHIAL WASHINGS  10/13/2019   Procedure: BRONCHIAL WASHINGS;  Surgeon: Garner Nash, DO;  Location: [facility];  Service: Pulmonary;;   COLONOSCOPY     multiple   DIVERTING ILEOSTOMY N/A 12/14/2021   Procedure: DIVERTING ILEOSTOMY;  Surgeon: Michael Boston, MD;  Location: [facility];  Service: General;  Laterality: N/A;   INTERCOSTAL NERVE BLOCK Left 12/16/2019   Procedure: INTERCOSTAL NERVE BLOCK;  Surgeon: Lajuana Matte,  MD;  Location: [facility];  Service: Thoracic;  Laterality: Left;   KNEE SURGERY  07/20/2008   bilat.   NASAL SEPTUM SURGERY  1995   NODE DISSECTION Left 12/16/2019   Procedure: NODE DISSECTION;  Surgeon: Lajuana Matte, MD;  Location: [facility];  Service: Thoracic;  Laterality: Left;   TONSILLECTOMY     TRANSURETHRAL RESECTION OF BLADDER TUMOR  2002   UPPER GASTROINTESTINAL ENDOSCOPY     VIDEO BRONCHOSCOPY WITH ENDOBRONCHIAL NAVIGATION N/A 10/13/2019   Procedure: VIDEO BRONCHOSCOPY WITH ENDOBRONCHIAL NAVIGATION;  Surgeon: Garner Nash, DO;  Location: [facility];  Service: Pulmonary;  Laterality: N/A;   WISDOM TOOTH EXTRACTION     XI ROBOTIC ASSISTED LOWER ANTERIOR RESECTION N/A 12/14/2021   Procedure: XI ROBOTIC ASSISTED LOWER ANTERIOR ULTRA LOW RECTOSIGMOID RESECTION, COLOANAL HAND SEWN ANASTOMOSIS, AND BILATERAL TAP BLOCK;  Surgeon: Michael Boston, MD;  Location: [facility];  Service: General;  Laterality: N/A;    REVIEW OF SYSTEMS:   Review of Systems  Constitutional: Negative for appetite change, chills, fatigue, fever and unexpected weight change.  HENT: Negative  for mouth sores, nosebleeds, sore throat and trouble swallowing.   Eyes: Negative for eye problems and icterus.  Respiratory: Positive for stable dyspnea with exertion. Negative for cough, hemoptysis, and wheezing.   Cardiovascular: Negative for chest pain and leg swelling.  Gastrointestinal: Negative for abdominal pain, constipation, diarrhea, nausea and vomiting.  Genitourinary: Negative for bladder incontinence, difficulty urinating, dysuria, frequency and hematuria.   Musculoskeletal: Negative for back pain, gait problem, neck pain and neck stiffness.  Skin: Negative for itching and rash.  Neurological: Negative for dizziness, extremity weakness, gait problem, headaches, light-headedness and seizures.  Hematological: Negative for adenopathy. Does not bruise/bleed easily.  Psychiatric/Behavioral: Negative for confusion, depression and sleep disturbance. The patient is not nervous/anxious.     PHYSICAL EXAMINATION:  There were no vitals taken for this visit.  ECOG PERFORMANCE STATUS: 1  Physical Exam  Constitutional: Oriented to person, place, and time and well-developed, well-nourished, and in no distress. No distress.  HENT:  Head: Normocephalic and atraumatic.  Mouth/Throat: Oropharynx is clear and moist. No oropharyngeal exudate.  Eyes: Conjunctivae are normal. Right eye exhibits no discharge. Left eye exhibits no discharge. No scleral icterus.  Neck: Normal range of motion. Neck supple.  Cardiovascular: Normal rate, regular rhythm, normal heart sounds and intact distal pulses.   Pulmonary/Chest: Effort normal. Positive for wheezing bilaterally. No wheezes. No rales.  Abdominal: Soft. Bowel sounds are normal. Exhibits no distension and no mass. There is no tenderness.  Musculoskeletal: Normal range of motion. Exhibits no edema.  Lymphadenopathy:    No cervical adenopathy.  Neurological: Alert and oriented to person, place, and time. Exhibits normal muscle tone. Gait normal.  Coordination normal.  Skin: Skin is warm and dry. No rash noted. Not diaphoretic. No erythema. No pallor.  Psychiatric: Mood, memory and judgment normal.  Vitals reviewed.  LABORATORY DATA: Lab Results  Component Value Date   WBC 6.2 01/19/2022   HGB 12.1 (L) 01/19/2022   HCT 34.5 (L) 01/19/2022   MCV 95.6 01/19/2022   PLT 196 01/19/2022      Chemistry      Component Value Date/Time   NA 137 01/19/2022 0931   K 4.5 01/19/2022 0931   CL 106 01/19/2022 0931   CO2 24 01/19/2022 0931   BUN 31 (H) 01/19/2022 0931   CREATININE 1.12 01/19/2022 0931      Component Value Date/Time   CALCIUM 9.7 01/19/2022 0931  ALKPHOS 91 01/19/2022 0931   AST 16 01/19/2022 0931   ALT 11 01/19/2022 0931   BILITOT 0.6 01/19/2022 0931       RADIOGRAPHIC STUDIES:  [place] CORE BIOPSY (LYMPH NODES)  Result Date: 02/20/2022 INDICATION: 71 year old male with a history of lung cancer now with FDG avid left axillary lymphadenopathy. EXAM: ULTRASOUND BIOPSY OF THE LYMPH NODES MEDICATIONS: None. ANESTHESIA/SEDATION: None. FLUOROSCOPY TIME:  None. COMPLICATIONS: None immediate. PROCEDURE: Informed written consent was obtained from the patient after a thorough discussion of the procedural risks, benefits and alternatives. All questions were addressed. Maximal Sterile Barrier Technique was utilized including caps, mask, sterile gowns, sterile gloves, sterile drape, hand hygiene and skin antiseptic. A timeout was performed prior to the initiation of the procedure. Ultrasound was used to interrogate the left axillary space. The abnormal lymph node was successfully identified. A suitable skin entry site was selected and marked. The region was sterilely prepped and draped in the standard fashion using chlorhexidine skin prep. Local anesthesia was attained by infiltration with 1% lidocaine. A small dermatotomy was made. Under real-time ultrasound guidance, multiple 18 gauge core biopsies were obtained using the [employer] mission  automated biopsy device. Biopsy specimens were placed in saline and delivered to pathology for further analysis. Post biopsy imaging demonstrates no evidence of complication. IMPRESSION: Technically successful ultrasound-guided core biopsy of FDG avid left axillary lymph node. Electronically Signed   By: Jacqulynn Cadet M.D.   On: 02/20/2022 15:28   NM PET Image Restage (PS) Skull Base to Thigh (F-18 FDG)  Result Date: 02/04/2022 CLINICAL DATA:  Subsequent treatment strategy for stage II non-small cell left upper lobe lung cancer status post left upper lobectomy 12/16/2019, adjuvant chemotherapy. 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 EXAM: NUCLEAR MEDICINE PET SKULL BASE TO THIGH TECHNIQUE: 7.4 mCi F-18 FDG was injected intravenously. Full-ring PET imaging was performed from the skull base to thigh after the radiotracer. CT data was obtained and used for attenuation correction and anatomic localization. Fasting blood glucose: 118 mg/dl COMPARISON:  01/19/2022 chest CT. 03/24/2021 CT chest, abdomen and pelvis. 07/17/2021 MRI pelvis. 09/16/2019 PET-CT. FINDINGS: Mediastinal blood pool activity: SUV max 3.0 Liver activity: SUV max NA NECK: No hypermetabolic lymph nodes in the neck. Incidental CT findings: Hypodense 1.9 cm left thyroid nodule, non hypermetabolic. CHEST: Multiple new mildly enlarged hypermetabolic left axillary, mediastinal and left hilar lymph nodes. Representative 1.2 cm left axillary node with max SUV 13.9 (series 4/image 68). Representative high right paratracheal 0.8 cm node with max SUV 6.4 (series 4/image 68). Representative 1.0 cm subcarinal node with max SUV 8.3 (series 4/image 80). Representative 1.4 cm left hilar node with max SUV 9.3 (series 4/image 74). No enlarged or hypermetabolic right axillary or right hilar nodes. No hypermetabolic pulmonary  findings. Non hypermetabolic (max SUV 1.1) mixed cystic and solid 2.2 cm peripheral right lower lobe pulmonary nodule with solid components measuring up to 0.6 cm, technically below PET resolution. Incidental CT findings: Moderate paraseptal and centrilobular emphysema. Left upper lobectomy. Apical right upper lobe solid 0.6 cm pulmonary nodule is stable since 09/16/2019 PET-CT, below PET resolution and considered benign. Coronary atherosclerosis. Atherosclerotic thoracic aorta with dilated 4.3 cm ascending thoracic aorta. ABDOMEN/PELVIS: No abnormal hypermetabolic activity within the liver, pancreas, right adrenal gland, or spleen. No hypermetabolic lymph nodes in the abdomen or pelvis. Left adrenal 1.4 cm nodule with density 31 HU with mild FDG uptake with max SUV  3.3, stable size since 09/16/2019 PET-CT, most compatible with a benign adenoma. Status post rectosigmoid resection with colo-anal anastomosis and diverting ileostomy in the ventral right abdominal wall. Nonspecific mild FDG uptake at the colo-anal anastomosis with max SUV 4.9, without discrete mass in this location on the CT images, favoring postsurgical change. Incidental CT findings: Atherosclerotic nonaneurysmal abdominal aorta. Bilateral vasectomy clips. SKELETON: No focal hypermetabolic activity to suggest skeletal metastasis. Incidental CT findings: None. IMPRESSION: 1. New hypermetabolic left axillary, mediastinal and left hilar lymphadenopathy compatible with recurrent metastatic lung cancer. 2. No hypermetabolic metastatic disease in the neck, abdomen, pelvis or skeleton. 3. Non-hypermetabolic mixed cystic and solid 2.2 cm peripheral right lower lobe pulmonary nodule with solid components up to 0.6 cm. As described on recent chest CT report, given progression of this nodule on the CT images, low-grade adenocarcinoma not excluded. Continued chest CT surveillance warranted at a minimum. 4. Nonspecific mild FDG uptake at the colo-anal anastomosis  without discrete mass correlate on the CT images, favoring postsurgical change. 5. Hypodense non hypermetabolic 1.9 cm left thyroid nodule. Recommend non-emergent thyroid ultrasound. Reference: J Am Coll Radiol. 2015 Feb;12(2): 143-50 6. Chronic findings include: Aortic Atherosclerosis (ICD10-I70.0) and Emphysema (ICD10-J43.9). Coronary atherosclerosis. Stable small left adrenal adenoma, for which no follow-up is recommended. 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 Recommend annual imaging followup by CTA or MRA. This recommendation follows 2010 ACCF/AHA/AATS/ACR/ASA/SCA/SCAI/SIR/STS/SVM Guidelines for the Diagnosis and Management of Patients with Thoracic Aortic Disease. Circulation. 2010; 121: H607-P710. Aortic aneurysm NOS (ICD10-I71.9). Electronically Signed   By: Ilona Sorrel M.D.   On: 02/04/2022 12:54     ASSESSMENT/PLAN:  This very pleasant 71 year old Caucasian male diagnosed with stage Iib (T1 a, N1, M0) non-small cell lung cancer, adenocarcinoma.  He was diagnosed initially in June 2021.  He has no actionable mutations and his PD-L1 expression was 1%.  The patient is status post a left upper lobectomy with lymph node dissection on 12/16/2019 under the care of Dr. Kipp Brood.  He also was diagnosed with a pelvic GIST tumor.  For the lung malignancy, the patient completed a course of adjuvant systemic chemotherapy with cisplatin 75 mg per metered squared and Alimta 500 mg per metered squared IV every 3 weeks.  He is status post 4 cycles.  For the GIST tumor, the patient completed neoadjuvant treatment with imatinib 400 mg p.o. daily in April 2023 status post 18 months of treatment.He is on adjuvant imatinib.  Recently, the patient had a repeat CT scan the chest that showed increased soft tissue density in the left hilar region concerning for developing lymphadenopathy or disease recurrence.  The patient subsequently had a PET scan that showed new hypermetabolic left axillary, mediastinal, and  left hilar adenopathy compatible with disease recurrence.  He underwent a ultrasound-guided biopsy of the left axillary lymph node on 02/20/2022. From discussing with the pathologist, the final report is not finalized, but he did not see evidence of GIST or lung cancer. He is still working on stains for lymphoma, but would need additional tissue for testing.   The patient was seen with Dr. Julien Nordmann today.  Dr. Julien Nordmann had a lengthy discussion with the patient today about his current condition and recommended treatment options.  Dr. Julien Nordmann recommends reaching out to Dr. Johney Maine to see if he can perform left axillary lymph node dissection. If not, then we will reach out to Dr. Valeta Harms, who the patient has seen in the past, for consideration of bronchoscopy. the patient is interested in this option and  he is expected to start his first dose of treatment next week on 03/01/2022  The patient is scheduled for open takedown of loop ileostomy on 03/22/22 by Dr. Johney Maine.   The patient needs his [place] sent to a different mail order pharmacy due to it being cheaper. He is going to drop off a form to our clinic tomorrow with the information about which pharmacy this needs to be sent to.   The patient was advised to call immediately if she has any concerning symptoms in the interval. The patient voices understanding of current disease status and treatment options and is in agreement with the current care plan. All questions were answered. The patient knows to call the clinic with any problems, questions or concerns. We can certainly see the patient much sooner if necessary  No orders of the defined types were placed in this encounter.    Johniya Durfee L Shailynn Fong, PA-C 02/21/22  ADDENDUM: Hematology/Oncology Attending: I had a face-to-face encounter with the patient.  I reviewed his record and recommended his care plan.  This is a very pleasant 71 years old white male with history of stage IIb (T1a, N1, M0)  non-small cell lung cancer, adenocarcinoma initially diagnosed in June 2021 with no actionable mutations and PD-L1 expression of 1%. The patient status post left upper lobectomy with lymph node dissection followed by adjuvant systemic chemotherapy with cisplatin and Alimta for 4 cycles. He was also found to have GIST tumor of the pelvic area status post 1 year treatment with imatinib followed by surgical resection.  He is currently on additional adjuvant treatment with imatinib 400 mg p.o. daily and tolerating it fairly well.  His most recent imaging studies in October 2023 showed increased soft tissue in the left hilar region concerning for possible developing lymphadenopathy.  He had a PET scan few weeks later and it showed new hypermetabolic left axillary and mediastinal and left hilar lymphadenopathy compatible with recurrent metastatic lung cancer and no hypermetabolic disease in the neck, abdomen or pelvis and no bone metastasis..  The patient was referred to interventional radiology for ultrasound-guided core biopsy of the left axillary lymph node and the preliminary cytology showed no clear evidence for malignancy so far but the final report is still pending.  Pathology is asking for more tissue for further identification. I had a lengthy discussion with the patient and his wife today about his current condition and further investigation to confirm his diagnosis. I recommended for the patient to see Dr. Johney Maine or one of his partners for consideration of excisional biopsy of one of the hypermetabolic axillary lymph nodes.  We may also refer the patient to pulmonary medicine for consideration of bronchoscopy with EBUS to rule out recurrence in the lung if the pathology from the axillary lymph nodes is not conclusive. I will see the patient back for follow-up visit in few weeks for evaluation and more detailed discussion of treatment of his condition based on the pathology results. The patient was advised  to call immediately if he has any other concerning symptoms in the interval. The total time spent in the appointment was 30 minutes. Disclaimer: This note was dictated with voice recognition software. Similar sounding words can inadvertently be transcribed and may be missed upon review.  Eilleen Kempf, MD .

## 2022-02-22 ENCOUNTER — Inpatient Hospital Stay: Payer: Medicare Other | Attending: Internal Medicine | Admitting: Physician Assistant

## 2022-02-22 VITALS — BP 116/62 | HR 76 | Temp 99.0°F | Resp 17 | Wt 157.2 lb

## 2022-02-22 DIAGNOSIS — C3492 Malignant neoplasm of unspecified part of left bronchus or lung: Secondary | ICD-10-CM | POA: Diagnosis not present

## 2022-02-22 DIAGNOSIS — R918 Other nonspecific abnormal finding of lung field: Secondary | ICD-10-CM | POA: Diagnosis not present

## 2022-02-22 DIAGNOSIS — Z79899 Other long term (current) drug therapy: Secondary | ICD-10-CM | POA: Insufficient documentation

## 2022-02-22 DIAGNOSIS — Z9221 Personal history of antineoplastic chemotherapy: Secondary | ICD-10-CM | POA: Diagnosis not present

## 2022-02-22 DIAGNOSIS — Z85118 Personal history of other malignant neoplasm of bronchus and lung: Secondary | ICD-10-CM | POA: Insufficient documentation

## 2022-02-22 DIAGNOSIS — C49A9 Gastrointestinal stromal tumor of other sites: Secondary | ICD-10-CM | POA: Diagnosis not present

## 2022-02-22 DIAGNOSIS — Z902 Acquired absence of lung [part of]: Secondary | ICD-10-CM | POA: Diagnosis not present

## 2022-02-22 DIAGNOSIS — C49A5 Gastrointestinal stromal tumor of rectum: Secondary | ICD-10-CM

## 2022-02-22 LAB — SURGICAL PATHOLOGY

## 2022-02-23 ENCOUNTER — Telehealth: Payer: Self-pay | Admitting: Physician Assistant

## 2022-02-23 NOTE — Telephone Encounter (Signed)
I called the patient to review his pathology report. He has a pre-procedure appointment at the surgery office next week on 11/14. He is going to drop additional paperwork off here at [facility] on 11/14 as he is needing his gleevec prescribed to a different pharmacy.

## 2022-02-25 NOTE — Patient Instructions (Addendum)
SURGICAL WAITING ROOM VISITATION Patients having surgery or a procedure may have no more than 2 support people in the waiting area - these visitors may rotate in the visitor waiting room.   Children under the age of 79 must have an adult with them who is not the patient. If the patient needs to stay at the hospital during part of their recovery, the visitor guidelines for inpatient rooms apply.  PRE-OP VISITATION  Pre-op nurse will coordinate an appropriate time for 1 support person to accompany the patient in pre-op.  This support person may not rotate.  This visitor will be contacted when the time is appropriate for the visitor to come back in the pre-op area.  Please refer to the [facility] website for the visitor guidelines for Inpatients (after your surgery is over and you are in a regular room).  You are not required to quarantine at this time prior to your surgery. However, you must do this: Hand Hygiene often Do NOT share personal items Notify your provider if you are in close contact with someone who has COVID or you develop fever 100.4 or greater, new onset of sneezing, cough, sore throat, shortness of breath or body aches.  If you test positive for Covid or have been in contact with anyone that has tested positive in the last 10 days please notify you surgeon.    Your procedure is scheduled on:  Thursday  March 22, 2022  Report to [facility] Main Entrance: [address] entrance where the [employer] is available.   Report to admitting at:08:45 AM  +++++Call this number if you have any questions or problems the morning of surgery [phone]  Do not eat food after Midnight the night prior to your surgery/procedure.  After Midnight you may have the following liquids until   08:00 AM DAY OF SURGERY  Clear Liquid Diet Water Black Coffee (sugar ok, NO MILK/CREAM OR CREAMERS)  Tea (sugar ok, NO MILK/CREAM OR CREAMERS) regular and decaf                              Plain Jell-O  with no fruit (NO RED)                                           Fruit ices (not with fruit pulp, NO RED)                                     Popsicles (NO RED)                                                                  Juice: apple, WHITE grape, WHITE cranberry Sports drinks like Gatorade or Powerade (NO RED)               Drink two (2) bottles of Pre-surgery Clear Ensure starting at 6:00 pm the evening before your surgery. Drink extra amounts of water and clear liquids (listed below) to limit dehydration.      The day of surgery:  Drink ONE (1) Pre-Surgery Clear Ensure at 08:00  AM the morning of surgery. Drink in one sitting. Do not sip.  This drink was given to you during your hospital pre-op appointment visit. Nothing else to drink after completing the Pre-Surgery Clear Ensure: No candy, chewing gum or throat lozenges.    FOLLOW BOWEL PREP AND ANY ADDITIONAL PRE OP INSTRUCTIONS YOU RECEIVED FROM YOUR SURGEON'S OFFICE!!!   Oral Hygiene is also important to reduce your risk of infection.        Remember - BRUSH YOUR TEETH THE MORNING OF SURGERY WITH YOUR REGULAR TOOTHPASTE  Take ONLY these medicines the morning of surgery with A SIP OF WATER: Amlodipine, omeprazole (Prilosec), ????Imatinab (Charco)                    You may not have any metal on your body including  jewelry, and body piercing  Do not wear lotions, powders, cologne, or deodorant  Men may shave face and neck.  Contacts, Hearing Aids, dentures or bridgework may not be worn into surgery.   You may bring a small overnight bag with you on the day of surgery, only pack items that are not valuable .[facility] IS NOT RESPONSIBLE   FOR VALUABLES THAT ARE LOST OR STOLEN.   Do not bring your home medications to the hospital. The Pharmacy will dispense medications listed on your medication list to you during your admission in the Hospital.  Special Instructions: Bring a copy of your healthcare power  of attorney and living will documents the day of surgery, if you wish to have them scanned into your [facility] Medical Records- EPIC  Please read over the following fact sheets you were given: IF YOU HAVE QUESTIONS ABOUT YOUR PRE-OP INSTRUCTIONS, PLEASE CALL [phone]  ([place])   [place] - Preparing for Surgery Before surgery, you can play an important role.  Because skin is not sterile, your skin needs to be as free of germs as possible.  You can reduce the number of germs on your skin by washing with CHG (chlorahexidine gluconate) soap before surgery.  CHG is an antiseptic cleaner which kills germs and bonds with the skin to continue killing germs even after washing. Please DO NOT use if you have an allergy to CHG or antibacterial soaps.  If your skin becomes reddened/irritated stop using the CHG and inform your nurse when you arrive at Short Stay. Do not shave (including legs and underarms) for at least 48 hours prior to the first CHG shower.  You may shave your face/neck.  Please follow these instructions carefully:  1.  Shower with CHG Soap the night before surgery and the  morning of surgery.  2.  If you choose to wash your hair, wash your hair first as usual with your normal  shampoo.  3.  After you shampoo, rinse your hair and body thoroughly to remove the shampoo.                             4.  Use CHG as you would any other liquid soap.  You can apply chg directly to the skin and wash.  Gently with a scrungie or clean washcloth.  5.  Apply the CHG Soap to your body ONLY FROM THE NECK DOWN.   Do not use on face/ open  Wound or open sores. Avoid contact with eyes, ears mouth and genitals (private parts).                       Wash face,  Genitals (private parts) with your normal soap.             6.  Wash thoroughly, paying special attention to the area where your  surgery  will be performed.  7.  Thoroughly rinse your body with warm water from the neck  down.  8.  DO NOT shower/wash with your normal soap after using and rinsing off the CHG Soap.            9.  Pat yourself dry with a clean towel.            10.  Wear clean pajamas.            11.  Place clean sheets on your bed the night of your first shower and do not  sleep with pets.  ON THE DAY OF SURGERY : Do not apply any lotions/deodorants the morning of surgery.  Please wear clean clothes to the hospital/surgery center.    FAILURE TO FOLLOW THESE INSTRUCTIONS MAY RESULT IN THE CANCELLATION OF YOUR SURGERY  PATIENT SIGNATURE_________________________________  NURSE SIGNATURE__________________________________  ________________________________________________________________________        Adam Phenix    An incentive spirometer is a tool that can help keep your lungs clear and active. This tool measures how well you are filling your lungs with each breath. Taking long deep breaths may help reverse or decrease the chance of developing breathing (pulmonary) problems (especially infection) following: A long period of time when you are unable to move or be active. BEFORE THE PROCEDURE  If the spirometer includes an indicator to show your best effort, your nurse or respiratory therapist will set it to a desired goal. If possible, sit up straight or lean slightly forward. Try not to slouch. Hold the incentive spirometer in an upright position. INSTRUCTIONS FOR USE  Sit on the edge of your bed if possible, or sit up as far as you can in bed or on a chair. Hold the incentive spirometer in an upright position. Breathe out normally. Place the mouthpiece in your mouth and seal your lips tightly around it. Breathe in slowly and as deeply as possible, raising the piston or the ball toward the top of the column. Hold your breath for 3-5 seconds or for as long as possible. Allow the piston or ball to fall to the bottom of the column. Remove the mouthpiece from your mouth and  breathe out normally. Rest for a few seconds and repeat Steps 1 through 7 at least 10 times every 1-2 hours when you are awake. Take your time and take a few normal breaths between deep breaths. The spirometer may include an indicator to show your best effort. Use the indicator as a goal to work toward during each repetition. After each set of 10 deep breaths, practice coughing to be sure your lungs are clear. If you have an incision (the cut made at the time of surgery), support your incision when coughing by placing a pillow or rolled up towels firmly against it. Once you are able to get out of bed, walk around indoors and cough well. You may stop using the incentive spirometer when instructed by your caregiver.  RISKS AND COMPLICATIONS Take your time so you do not get dizzy or light-headed. If you  are in pain, you may need to take or ask for pain medication before doing incentive spirometry. It is harder to take a deep breath if you are having pain. AFTER USE Rest and breathe slowly and easily. It can be helpful to keep track of a log of your progress. Your caregiver can provide you with a simple table to help with this. If you are using the spirometer at home, follow these instructions: [place] IF:  You are having difficultly using the spirometer. You have trouble using the spirometer as often as instructed. Your pain medication is not giving enough relief while using the spirometer. You develop fever of 100.5 F (38.1 C) or higher.                                                                                                    SEEK IMMEDIATE MEDICAL CARE IF:  You cough up bloody sputum that had not been present before. You develop fever of 102 F (38.9 C) or greater. You develop worsening pain at or near the incision site. MAKE SURE YOU:  Understand these instructions. Will watch your condition. Will get help right away if you are not doing well or get worse. Document  Released: 08/13/2006 Document Revised: 06/25/2011 Document Reviewed: 10/14/2006 [facility] Patient Information 2014 [place], [place].

## 2022-02-25 NOTE — Progress Notes (Signed)
COVID Vaccine received:  _0  No _1  Yes Date of any COVID positive Test in last 90 days:  PCP - [name], MD Cardiologist - none Oncology- Curt Bears, MD Urology- Alexis Frock, MD  Chest x-ray - CT Chest - 01-21-2022 EKG -   Stress Test -  ECHO -  Cardiac Cath -   PCR screen: _2  Ordered & Completed                      _3   No Order but Needs PROFEND                      _4   N/A for this surgery  Surgery Plan:  _5  Ambulatory                            _6  Outpatient in bed                            _7  Admit  Anesthesia:    _8  General  _9  Spinal                           _10   Choice _11   MAC  Bowel Prep - _12  No  _13   Yes _____________  Pacemaker / ICD device _14  No _15  Yes        Device order form faxed _16  No    _17   Yes      Faxed to:  Spinal Cord Stimulator:_18  No _19  Yes      (Remind patient to bring remote DOS) Other Implants:   History of Sleep Apnea? _20  No _21  Yes   CPAP used?- _22  No _23  Yes    Does the patient monitor blood sugar? _24  No _25  Yes  _26  N/A Does patient have a [employer] or Dexacom? _27  No _28  Yes   Fasting Blood Sugar Ranges-  Checks Blood Sugar _____ times a day  Last dose of GLP1 agonist-  GLP1 instructions:  Last dose of SGLT-2 inhibitors-  SGLT-2 instructions:   Blood Thinner / Instructions:none Aspirin Instructions:  ERAS Protocol Ordered: _29  No  _30  Yes PRE-SURGERY _31  ENSURE  x3 (2 evening prior and 1 the morning of surgery) Patient is to be NPO after: 08:00am  Comments: Hx GIST tumor, Lung Ca, Bladder cancer,  Activity level: Patient can / can not climb a flight of stairs without difficulty; _32  No CP  _33  No SOB, but would have ______   Patient can / can not perform ADLs without assistance.   Anesthesia review: HTN, Remote hx heart murmur, not heard in many years  Patient denies shortness of breath, fever, cough and chest pain at PAT appointment.  Patient verbalized understanding and agreement to the Pre-Surgical  Instructions that were given to them at this PAT appointment. Patient was also educated of the need to review these PAT instructions again prior to his/her surgery.I reviewed the appropriate phone numbers to call if they have any and questions or concerns.

## 2022-02-27 ENCOUNTER — Other Ambulatory Visit: Payer: Self-pay

## 2022-02-27 ENCOUNTER — Other Ambulatory Visit: Payer: Self-pay | Admitting: *Deleted

## 2022-02-27 ENCOUNTER — Encounter (HOSPITAL_COMMUNITY): Payer: Self-pay

## 2022-02-27 ENCOUNTER — Ambulatory Visit: Payer: Self-pay | Admitting: Surgery

## 2022-02-27 ENCOUNTER — Encounter (HOSPITAL_COMMUNITY)
Admission: RE | Admit: 2022-02-27 | Discharge: 2022-02-27 | Disposition: A | Discharge status: Home / Self Care | Payer: Medicare Other | Source: Ambulatory Visit | Attending: Surgery | Admitting: Surgery

## 2022-02-27 VITALS — BP 125/63 | HR 66 | Temp 98.5°F | Resp 14 | Ht 69.0 in | Wt 155.0 lb

## 2022-02-27 DIAGNOSIS — C49A5 Gastrointestinal stromal tumor of rectum: Secondary | ICD-10-CM

## 2022-02-27 DIAGNOSIS — I10 Essential (primary) hypertension: Secondary | ICD-10-CM | POA: Diagnosis not present

## 2022-02-27 DIAGNOSIS — Z01812 Encounter for preprocedural laboratory examination: Secondary | ICD-10-CM | POA: Insufficient documentation

## 2022-02-27 DIAGNOSIS — R591 Generalized enlarged lymph nodes: Secondary | ICD-10-CM | POA: Diagnosis not present

## 2022-02-27 DIAGNOSIS — R590 Localized enlarged lymph nodes: Secondary | ICD-10-CM

## 2022-02-27 LAB — BASIC METABOLIC PANEL
Anion gap: 8 (ref 5–15)
BUN: 20 mg/dL (ref 8–23)
CO2: 27 mmol/L (ref 22–32)
Calcium: 9.4 mg/dL (ref 8.9–10.3)
Chloride: 108 mmol/L (ref 98–111)
Creatinine, Ser: 0.74 mg/dL (ref 0.61–1.24)
GFR, Estimated: >60 mL/min (ref >60)
Glucose, Bld: 111 mg/dL — ABNORMAL HIGH (ref 70–99)
Potassium: 3.9 mmol/L (ref 3.5–5.1)
Sodium: 143 mmol/L (ref 135–145)

## 2022-02-27 LAB — CBC
HCT: 33.6 % — ABNORMAL LOW (ref 39.0–52.0)
Hemoglobin: 11.2 g/dL — ABNORMAL LOW (ref 13.0–17.0)
MCH: 32.7 pg (ref 26.0–34.0)
MCHC: 33.3 g/dL (ref 30.0–36.0)
MCV: 98.2 fL (ref 80.0–100.0)
Platelets: 147 10*3/uL — ABNORMAL LOW (ref 150–400)
RBC: 3.42 MIL/uL — ABNORMAL LOW (ref 4.22–5.81)
RDW: 14.5 % (ref 11.5–15.5)
WBC: 4.4 10*3/uL (ref 4.0–10.5)
nRBC: 0 % (ref 0.0–0.2)

## 2022-02-27 MED ORDER — IMATINIB MESYLATE 400 MG PO TABS: 400.0000 mg | ORAL_TABLET | Freq: Every day | ORAL | 3 refills | Status: DC

## 2022-02-27 NOTE — Telephone Encounter (Signed)
Notified Jesse [name] that [place] has been escribed to [employer]

## 2022-02-28 ENCOUNTER — Encounter (HOSPITAL_COMMUNITY): Payer: Self-pay | Admitting: Nurse Practitioner

## 2022-03-03 IMAGING — CT CT CHEST LUNG CANCER SCREENING LOW DOSE W/O CM
2 of 4 series · 15 of 36 positions shown, 18 images · non-contrast
Comparison: 09/01/2018

CLINICAL DATA: Lung cancer screening. Seventy-five pack-year
history. Current asymptomatic smoker.

EXAM:
CT CHEST WITHOUT CONTRAST LOW-DOSE FOR LUNG CANCER SCREENING
TECHNIQUE: Multidetector CT imaging of the chest was performed following the
standard protocol without IV contrast.

[Series 3: lung thins 1.0 · axial · 0.75mm/px · z∈[-356,-46]mm · 12 of 343 slices shown, 15 images]
[im 16/343  mediastinal]
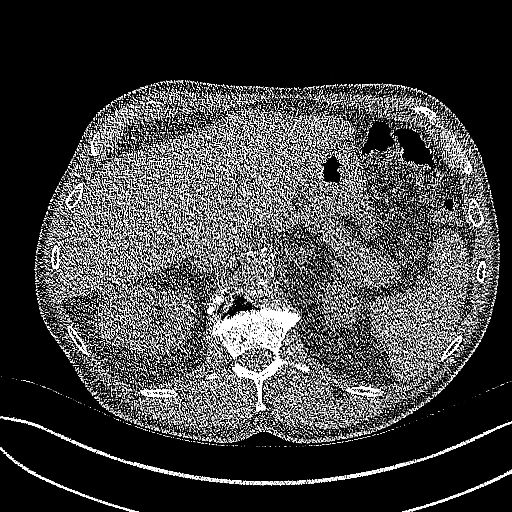
[im 16/343  lung]
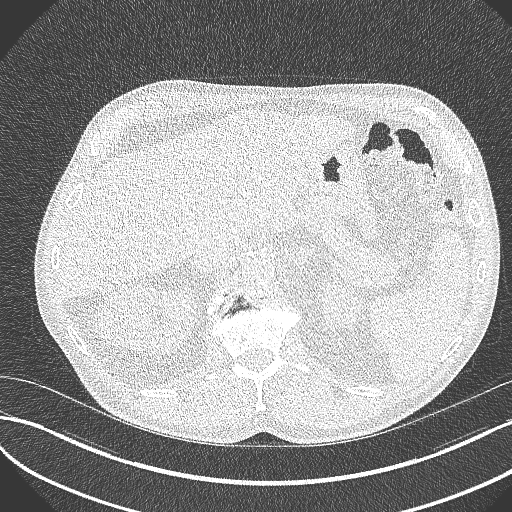
[im 47/343  lung]
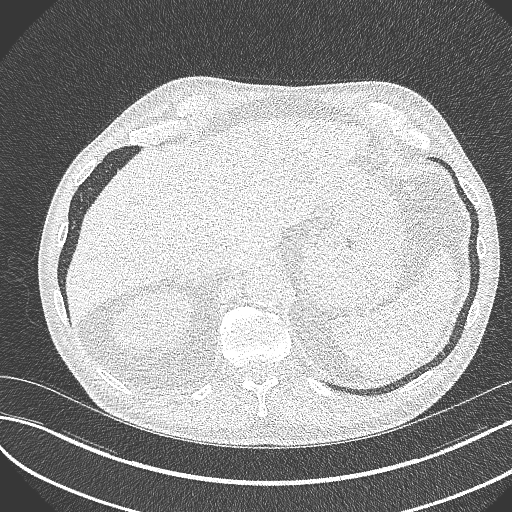
[im 78/343  lung]
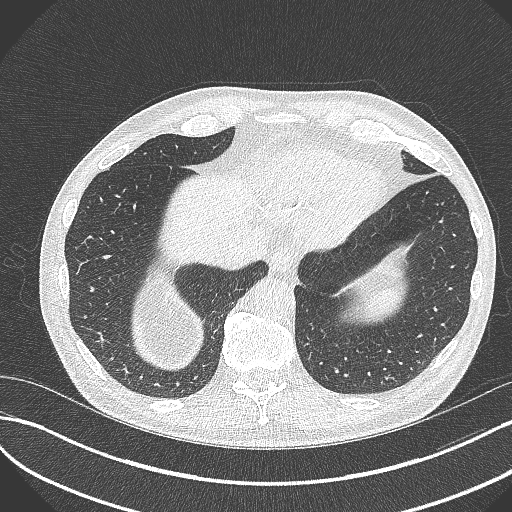
[im 109/343  lung]
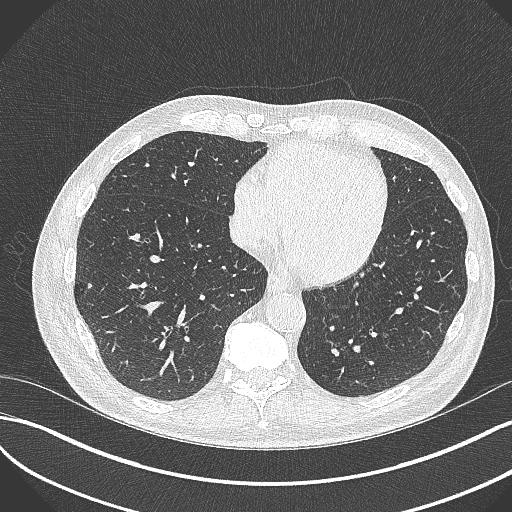
[im 125/343  mediastinal]
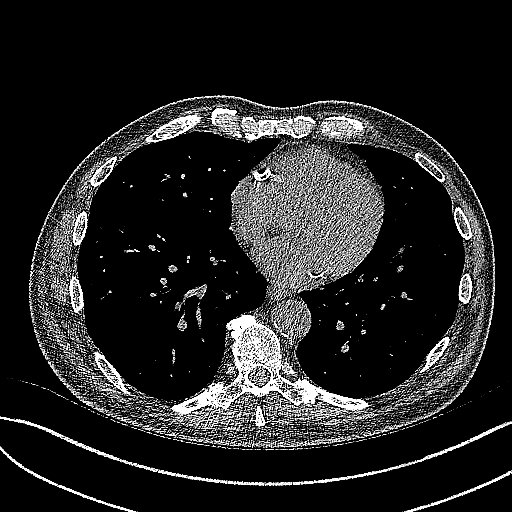
[im 125/343  lung]
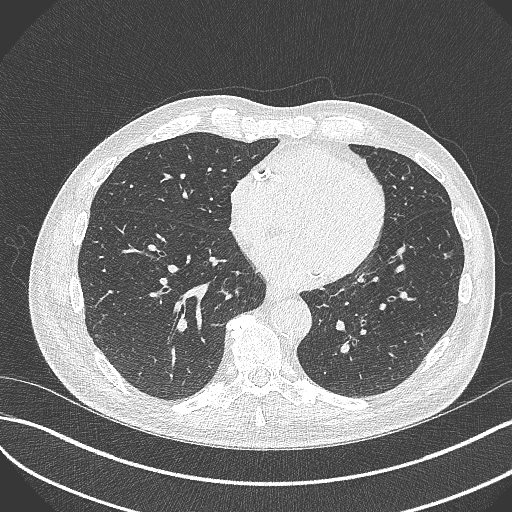
[im 156/343  lung]
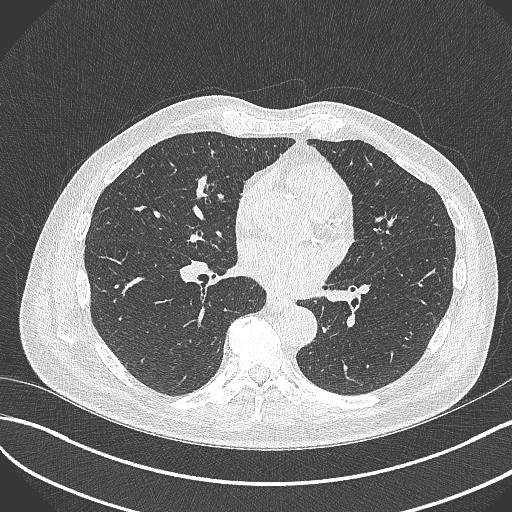
[im 187/343  lung]
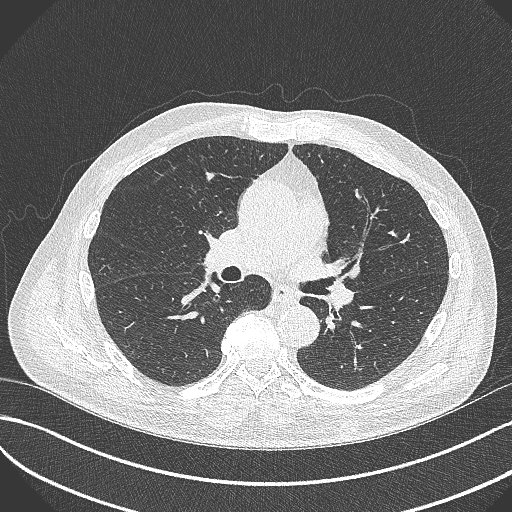
[im 218/343  lung]
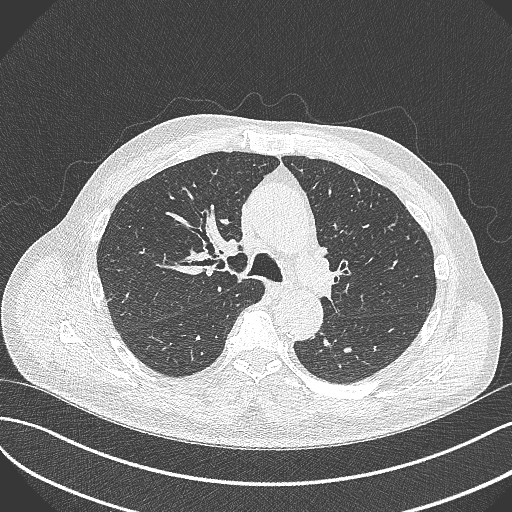
[im 234/343  mediastinal]
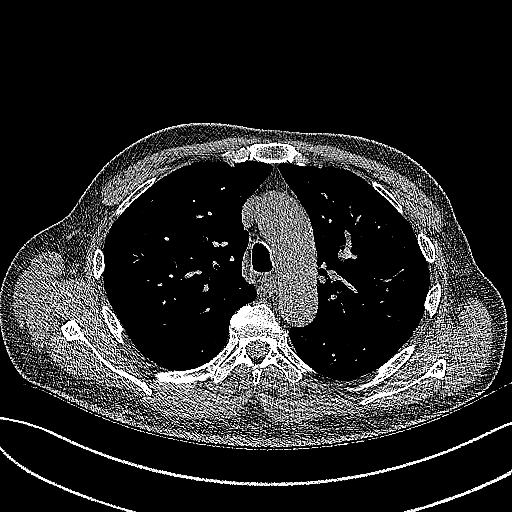
[im 234/343  lung]
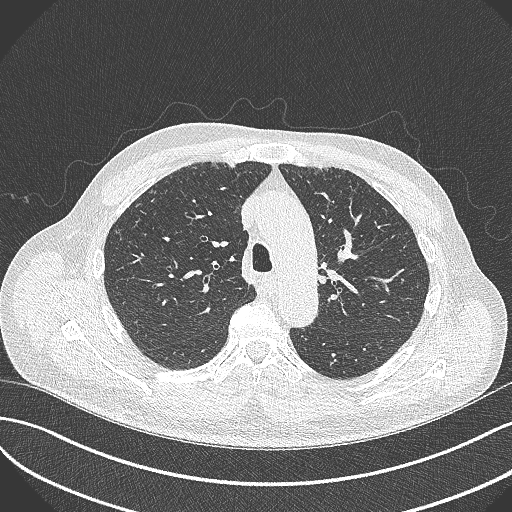
[im 265/343  lung]
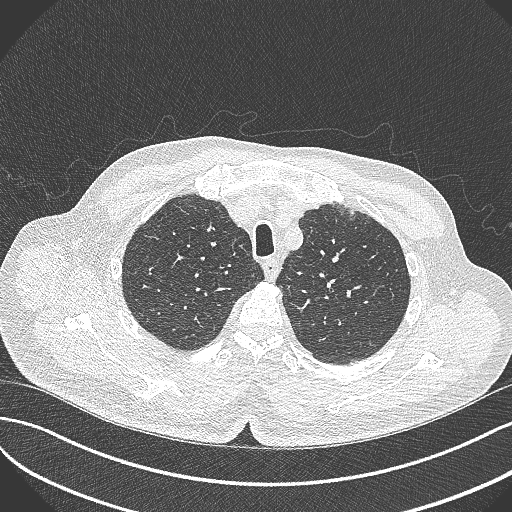
[im 296/343  lung]
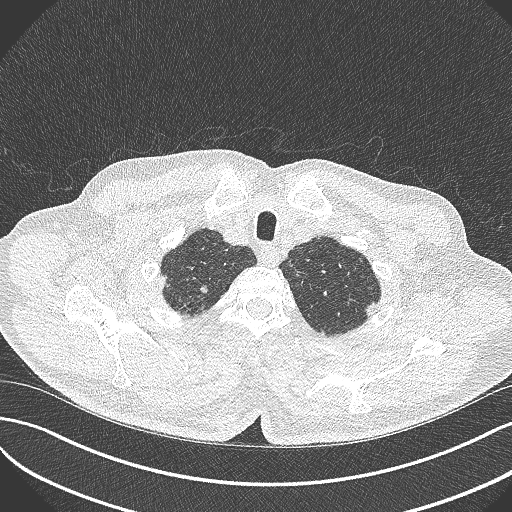
[im 327/343  lung]
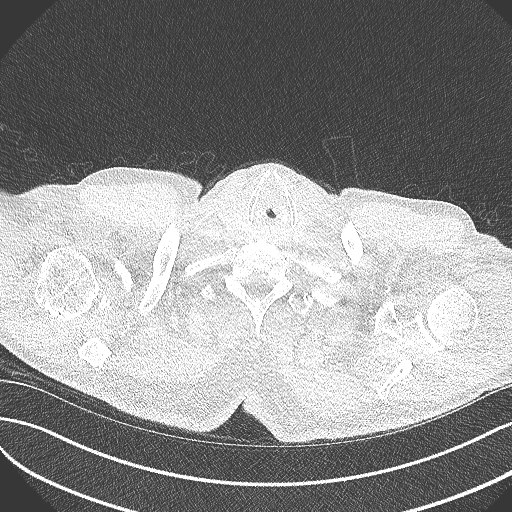

[Series 5: coronal · coronal · 0.68mm/px · 3 of 130 slices shown]
[im 26/130  lung]
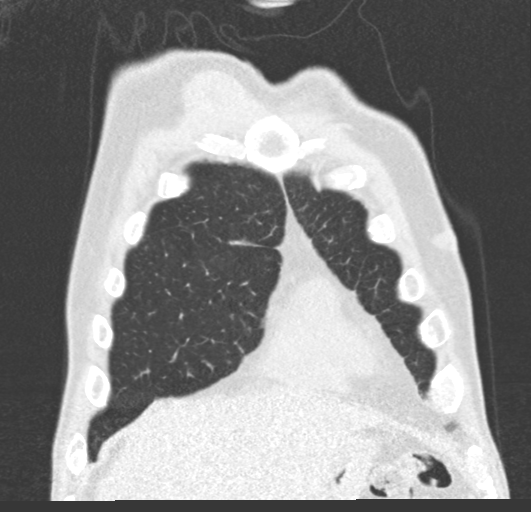
[im 52/130  lung]
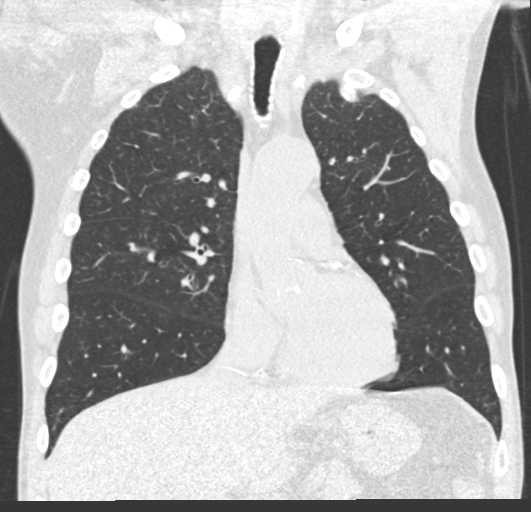
[im 78/130  lung]
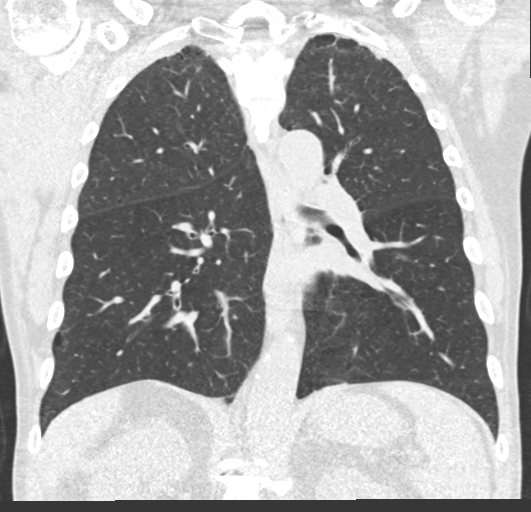

[15 of 36 positions shown; findings below may reference images not displayed]

FINDINGS: Cardiovascular: The heart size is normal. No pericardial effusion.
Aortic atherosclerosis. Left main, lad, left circumflex and RCA
coronary artery calcifications.

Mediastinum/Nodes: Normal appearance of the thyroid gland. The
trachea appears patent and is midline. Normal appearance of the
esophagus. No mediastinal or hilar adenopathy.

Lungs/Pleura: Paraseptal and centrilobular emphysema with diffuse
bronchial wall thickening noted. Multiple pulmonary nodules are
again identified scattered throughout both lungs. Posterior left
upper lobe lung nodule has increased in size when compared with the
previous exam, image 173/3. On today's study this has a mean derived
diameter of 8.2 mm. Previously 4.9 mm. Other lung nodules remain
unchanged.

Upper Abdomen: No acute abnormality.

Musculoskeletal: Spondylosis identified within the thoracic spine.
No acute or suspicious bone lesions identified.
IMPRESSION: 1. Lung-RADS 4B, suspicious. Additional imaging evaluation or
consultation with Pulmonology or Thoracic Surgery recommended.
2. Diffuse bronchial wall thickening with emphysema, as above;
imaging findings suggestive of underlying COPD.
3. Aortic atherosclerosis, in addition to 3 vessel coronary artery
disease. Please note that although the presence of coronary artery
calcium documents the presence of coronary artery disease, the
severity of this disease and any potential stenosis cannot be
assessed on this non-gated CT examination. Assessment for potential
risk factor modification, dietary therapy or pharmacologic therapy
may be warranted, if clinically indicated.

Aortic Atherosclerosis (K7ZS9-1SP.P) and Emphysema (K7ZS9-DXW.N).

## 2022-03-05 ENCOUNTER — Other Ambulatory Visit: Payer: Self-pay | Admitting: Surgery

## 2022-03-05 DIAGNOSIS — R590 Localized enlarged lymph nodes: Secondary | ICD-10-CM

## 2022-03-07 ENCOUNTER — Other Ambulatory Visit (HOSPITAL_COMMUNITY): Payer: Self-pay

## 2022-03-09 ENCOUNTER — Other Ambulatory Visit (HOSPITAL_COMMUNITY): Payer: Self-pay

## 2022-03-13 LAB — SURGICAL PATHOLOGY

## 2022-03-16 ENCOUNTER — Ambulatory Visit
Admission: RE | Admit: 2022-03-16 | Discharge: 2022-03-16 | Disposition: A | Discharge status: Home / Self Care | Payer: Medicare Other | Source: Ambulatory Visit | Attending: Surgery | Admitting: Surgery

## 2022-03-16 ENCOUNTER — Other Ambulatory Visit: Payer: Self-pay | Admitting: Surgery

## 2022-03-16 DIAGNOSIS — R590 Localized enlarged lymph nodes: Secondary | ICD-10-CM

## 2022-03-16 HISTORY — PX: BREAST BIOPSY: SHX20

## 2022-03-19 ENCOUNTER — Other Ambulatory Visit: Payer: Self-pay | Admitting: Pharmacist

## 2022-03-19 ENCOUNTER — Telehealth: Payer: Self-pay | Admitting: Pharmacy Technician

## 2022-03-19 ENCOUNTER — Other Ambulatory Visit (HOSPITAL_COMMUNITY): Payer: Self-pay

## 2022-03-19 DIAGNOSIS — C49A5 Gastrointestinal stromal tumor of rectum: Secondary | ICD-10-CM

## 2022-03-19 MED ORDER — IMATINIB MESYLATE 400 MG PO TABS: 400.0000 mg | ORAL_TABLET | Freq: Every day | ORAL | 3 refills | Status: DC

## 2022-03-19 NOTE — Progress Notes (Signed)
Instructions only. Pt had PAT appointment on 11/14 at [facility] for another surgery.   Patient verbally denies any shortness of breath, fever, cough and chest pain during phone call   -------------  SDW INSTRUCTIONS given:  Your procedure is scheduled on 03/20/22.  Report to [facility] Main Entrance "A" at 0830 A.M., and check in at the Admitting office.  Call this number if you have problems the morning of surgery:  [phone]   Remember:  Do not eat after midnight the night before your surgery  You may drink clear liquids until 0800 the morning of your surgery.   Clear liquids allowed are: Water, Non-Citrus Juices (without pulp), Carbonated Beverages, Clear Tea, Black Coffee Only, and Gatorade    Take these medicines the morning of surgery with A SIP OF WATER  Tylenol Amlodipine   As of today, STOP taking any Aspirin (unless otherwise instructed by your surgeon) Aleve, Naproxen, Ibuprofen, Motrin, Advil, Goody's, BC's, all herbal medications, fish oil, and all vitamins.                      Do not wear jewelry, make up, or nail polish            Do not wear lotions, powders, perfumes/colognes, or deodorant.            Do not shave 48 hours prior to surgery.  Men may shave face and neck.            Do not bring valuables to the hospital.            [facility] is not responsible for any belongings or valuables.  Do NOT Smoke (Tobacco/Vaping) 24 hours prior to your procedure If you use a CPAP at night, you may bring all equipment for your overnight stay.   Contacts, glasses, dentures or bridgework may not be worn into surgery.      For patients admitted to the hospital, discharge time will be determined by your treatment team.   Patients discharged the day of surgery will not be allowed to drive home, and someone needs to stay with them for 24 hours.    Special instructions:   [place]- Preparing For Surgery  Before surgery, you can play an important role. Because  skin is not sterile, your skin needs to be as free of germs as possible. You can reduce the number of germs on your skin by washing with CHG (chlorahexidine gluconate) Soap before surgery.  CHG is an antiseptic cleaner which kills germs and bonds with the skin to continue killing germs even after washing.    Oral Hygiene is also important to reduce your risk of infection.  Remember - BRUSH YOUR TEETH THE MORNING OF SURGERY WITH YOUR REGULAR TOOTHPASTE  Please do not use if you have an allergy to CHG or antibacterial soaps. If your skin becomes reddened/irritated stop using the CHG.  Do not shave (including legs and underarms) for at least 48 hours prior to first CHG shower. It is OK to shave your face.  Please follow these instructions carefully.   Shower the NIGHT BEFORE SURGERY and the MORNING OF SURGERY with DIAL Soap.   Pat yourself dry with a CLEAN TOWEL.  Wear CLEAN PAJAMAS to bed the night before surgery  Place CLEAN SHEETS on your bed the night of your first shower and DO NOT SLEEP WITH PETS.   Day of Surgery: Please shower morning of surgery  Wear Clean/Comfortable clothing the  morning of surgery Do not apply any deodorants/lotions.   Remember to brush your teeth WITH YOUR REGULAR TOOTHPASTE.   Questions were answered. Patient verbalized understanding of instructions.

## 2022-03-19 NOTE — Telephone Encounter (Signed)
Oral Oncology Patient Advocate Encounter  Called patient to confirm which email they used on their [facility] account.  Patient confirmed email:  [email or web address]   Rx will be resent with this email so that prescription can be processed via the patient's online [place], [facility]-Adv Oncology Pharmacy Patient [place] Direct Number: [phone]  Fax: [phone]

## 2022-03-19 NOTE — Progress Notes (Signed)
Oral Oncology Pharmacist Encounter  Patient email of: [email or web address] added to patients imatinib script that is now being filled through [facility]. All prescriptions filled through this specific pharmacy are required to have patients email included on Rx.   Leron Croak, PharmD, BCPS, [facility] Hematology/Oncology Clinical Pharmacist [facility] [place] [phone] 03/19/2022 9:25 AM

## 2022-03-20 ENCOUNTER — Other Ambulatory Visit (HOSPITAL_COMMUNITY): Payer: Self-pay

## 2022-03-20 ENCOUNTER — Other Ambulatory Visit: Payer: Self-pay

## 2022-03-20 ENCOUNTER — Telehealth: Payer: Self-pay | Admitting: Internal Medicine

## 2022-03-20 ENCOUNTER — Encounter (HOSPITAL_COMMUNITY): Payer: Self-pay | Admitting: Surgery

## 2022-03-20 ENCOUNTER — Ambulatory Visit (HOSPITAL_COMMUNITY): Payer: Medicare Other | Admitting: Anesthesiology

## 2022-03-20 ENCOUNTER — Ambulatory Visit
Admission: RE | Admit: 2022-03-20 | Discharge: 2022-03-20 | Disposition: A | Discharge status: Home / Self Care | Payer: Medicare Other | Source: Ambulatory Visit | Attending: Surgery | Admitting: Surgery

## 2022-03-20 ENCOUNTER — Ambulatory Visit (HOSPITAL_BASED_OUTPATIENT_CLINIC_OR_DEPARTMENT_OTHER): Payer: Medicare Other | Admitting: Anesthesiology

## 2022-03-20 ENCOUNTER — Encounter (HOSPITAL_COMMUNITY)
Admission: RE | Disposition: A | Discharge status: Home / Self Care | Payer: Self-pay | Source: Ambulatory Visit | Attending: Surgery

## 2022-03-20 ENCOUNTER — Ambulatory Visit (HOSPITAL_COMMUNITY)
Admission: RE | Admit: 2022-03-20 | Discharge: 2022-03-20 | Disposition: A | Discharge status: Home / Self Care | Payer: Medicare Other | Source: Ambulatory Visit | Attending: Surgery | Admitting: Surgery

## 2022-03-20 DIAGNOSIS — I2510 Atherosclerotic heart disease of native coronary artery without angina pectoris: Secondary | ICD-10-CM | POA: Insufficient documentation

## 2022-03-20 DIAGNOSIS — I10 Essential (primary) hypertension: Secondary | ICD-10-CM | POA: Diagnosis not present

## 2022-03-20 DIAGNOSIS — Z932 Ileostomy status: Secondary | ICD-10-CM | POA: Insufficient documentation

## 2022-03-20 DIAGNOSIS — J449 Chronic obstructive pulmonary disease, unspecified: Secondary | ICD-10-CM | POA: Diagnosis not present

## 2022-03-20 DIAGNOSIS — J439 Emphysema, unspecified: Secondary | ICD-10-CM | POA: Insufficient documentation

## 2022-03-20 DIAGNOSIS — K660 Peritoneal adhesions (postprocedural) (postinfection): Secondary | ICD-10-CM | POA: Diagnosis not present

## 2022-03-20 DIAGNOSIS — K219 Gastro-esophageal reflux disease without esophagitis: Secondary | ICD-10-CM | POA: Insufficient documentation

## 2022-03-20 DIAGNOSIS — Z432 Encounter for attention to ileostomy: Secondary | ICD-10-CM | POA: Diagnosis not present

## 2022-03-20 DIAGNOSIS — E041 Nontoxic single thyroid nodule: Secondary | ICD-10-CM | POA: Insufficient documentation

## 2022-03-20 DIAGNOSIS — E785 Hyperlipidemia, unspecified: Secondary | ICD-10-CM | POA: Diagnosis not present

## 2022-03-20 DIAGNOSIS — M1990 Unspecified osteoarthritis, unspecified site: Secondary | ICD-10-CM | POA: Diagnosis not present

## 2022-03-20 DIAGNOSIS — D649 Anemia, unspecified: Secondary | ICD-10-CM | POA: Insufficient documentation

## 2022-03-20 DIAGNOSIS — Z8551 Personal history of malignant neoplasm of bladder: Secondary | ICD-10-CM | POA: Diagnosis not present

## 2022-03-20 DIAGNOSIS — Z433 Encounter for attention to colostomy: Secondary | ICD-10-CM | POA: Diagnosis not present

## 2022-03-20 DIAGNOSIS — Z87891 Personal history of nicotine dependence: Secondary | ICD-10-CM | POA: Insufficient documentation

## 2022-03-20 DIAGNOSIS — R590 Localized enlarged lymph nodes: Secondary | ICD-10-CM

## 2022-03-20 DIAGNOSIS — Z9889 Other specified postprocedural states: Secondary | ICD-10-CM | POA: Diagnosis present

## 2022-03-20 DIAGNOSIS — Z79811 Long term (current) use of aromatase inhibitors: Secondary | ICD-10-CM | POA: Diagnosis not present

## 2022-03-20 DIAGNOSIS — Z8249 Family history of ischemic heart disease and other diseases of the circulatory system: Secondary | ICD-10-CM | POA: Diagnosis not present

## 2022-03-20 DIAGNOSIS — K9409 Other complications of colostomy: Secondary | ICD-10-CM | POA: Diagnosis not present

## 2022-03-20 DIAGNOSIS — Z902 Acquired absence of lung [part of]: Secondary | ICD-10-CM | POA: Diagnosis not present

## 2022-03-20 DIAGNOSIS — Z96653 Presence of artificial knee joint, bilateral: Secondary | ICD-10-CM | POA: Diagnosis not present

## 2022-03-20 DIAGNOSIS — Z79899 Other long term (current) drug therapy: Secondary | ICD-10-CM | POA: Diagnosis not present

## 2022-03-20 DIAGNOSIS — E876 Hypokalemia: Secondary | ICD-10-CM | POA: Diagnosis not present

## 2022-03-20 DIAGNOSIS — Z933 Colostomy status: Secondary | ICD-10-CM | POA: Diagnosis not present

## 2022-03-20 DIAGNOSIS — C3492 Malignant neoplasm of unspecified part of left bronchus or lung: Secondary | ICD-10-CM | POA: Diagnosis not present

## 2022-03-20 DIAGNOSIS — R599 Enlarged lymph nodes, unspecified: Secondary | ICD-10-CM | POA: Diagnosis not present

## 2022-03-20 DIAGNOSIS — I700 Atherosclerosis of aorta: Secondary | ICD-10-CM | POA: Insufficient documentation

## 2022-03-20 DIAGNOSIS — I890 Lymphedema, not elsewhere classified: Secondary | ICD-10-CM | POA: Insufficient documentation

## 2022-03-20 DIAGNOSIS — Q859 Phakomatosis, unspecified: Secondary | ICD-10-CM | POA: Diagnosis not present

## 2022-03-20 DIAGNOSIS — C49A5 Gastrointestinal stromal tumor of rectum: Secondary | ICD-10-CM | POA: Diagnosis not present

## 2022-03-20 HISTORY — PX: RADIOACTIVE SEED GUIDED AXILLARY SENTINEL LYMPH NODE: SHX6735

## 2022-03-20 LAB — COMPREHENSIVE METABOLIC PANEL
ALT: 18 U/L (ref 0–44)
AST: 23 U/L (ref 15–41)
Albumin: 3.9 g/dL (ref 3.5–5.0)
Alkaline Phosphatase: 86 U/L (ref 38–126)
Anion gap: 7 (ref 5–15)
BUN: 21 mg/dL (ref 8–23)
CO2: 24 mmol/L (ref 22–32)
Calcium: 9.4 mg/dL (ref 8.9–10.3)
Chloride: 108 mmol/L (ref 98–111)
Creatinine, Ser: 0.93 mg/dL (ref 0.61–1.24)
GFR, Estimated: >60 mL/min (ref >60)
Glucose, Bld: 107 mg/dL — ABNORMAL HIGH (ref 70–99)
Potassium: 3.6 mmol/L (ref 3.5–5.1)
Sodium: 139 mmol/L (ref 135–145)
Total Bilirubin: 0.6 mg/dL (ref 0.3–1.2)
Total Protein: 6.7 g/dL (ref 6.5–8.1)

## 2022-03-20 SURGERY — RADIOACTIVE SEED GUIDED AXILLARY SENTINEL LYMPH NODE BIOPSY
Anesthesia: General | Laterality: Left

## 2022-03-20 MED ORDER — LIDOCAINE 2% (20 MG/ML) 5 ML SYRINGE
INTRAMUSCULAR | Status: DC | PRN
  Administered 2022-03-20: 100 mg via INTRAVENOUS

## 2022-03-20 MED ORDER — CEFAZOLIN SODIUM-DEXTROSE 2-4 GM/100ML-% IV SOLN
2.0000 g | INTRAVENOUS | Status: AC
  Administered 2022-03-20: 2 g via INTRAVENOUS

## 2022-03-20 MED ORDER — LIDOCAINE 2% (20 MG/ML) 5 ML SYRINGE
INTRAMUSCULAR | Status: AC
  Filled 2022-03-20: qty 5

## 2022-03-20 MED ORDER — OXYCODONE HCL 5 MG PO TABS
5.0000 mg | ORAL_TABLET | Freq: Four times a day (QID) | ORAL | 0 refills | Status: DC | PRN
  Filled 2022-03-20: qty 15, 4d supply, fill #0

## 2022-03-20 MED ORDER — GLYCOPYRROLATE PF 0.2 MG/ML IJ SOSY
PREFILLED_SYRINGE | INTRAMUSCULAR | Status: DC | PRN
  Administered 2022-03-20 (×2): .1 mg via INTRAVENOUS

## 2022-03-20 MED ORDER — PROPOFOL 10 MG/ML IV BOLUS
INTRAVENOUS | Status: AC
  Filled 2022-03-20: qty 20

## 2022-03-20 MED ORDER — ONDANSETRON HCL 4 MG/2ML IJ SOLN
INTRAMUSCULAR | Status: DC | PRN
  Administered 2022-03-20: 4 mg via INTRAVENOUS

## 2022-03-20 MED ORDER — GLYCOPYRROLATE PF 0.2 MG/ML IJ SOSY
PREFILLED_SYRINGE | INTRAMUSCULAR | Status: AC
  Filled 2022-03-20: qty 1

## 2022-03-20 MED ORDER — DEXAMETHASONE SODIUM PHOSPHATE 10 MG/ML IJ SOLN
INTRAMUSCULAR | Status: DC | PRN
  Administered 2022-03-20: 10 mg via INTRAVENOUS

## 2022-03-20 MED ORDER — FENTANYL CITRATE (PF) 250 MCG/5ML IJ SOLN
INTRAMUSCULAR | Status: AC
  Filled 2022-03-20: qty 5

## 2022-03-20 MED ORDER — CHLORHEXIDINE GLUCONATE 0.12 % MT SOLN
OROMUCOSAL | Status: AC
  Administered 2022-03-20: 15 mL via OROMUCOSAL
  Filled 2022-03-20: qty 15

## 2022-03-20 MED ORDER — CHLORHEXIDINE GLUCONATE 0.12 % MT SOLN: 15.0000 mL | Freq: Once | OROMUCOSAL | Status: AC

## 2022-03-20 MED ORDER — PHENYLEPHRINE 80 MCG/ML (10ML) SYRINGE FOR IV PUSH (FOR BLOOD PRESSURE SUPPORT)
PREFILLED_SYRINGE | INTRAVENOUS | Status: AC
  Filled 2022-03-20: qty 10

## 2022-03-20 MED ORDER — BUPIVACAINE-EPINEPHRINE (PF) 0.25% -1:200000 IJ SOLN
INTRAMUSCULAR | Status: AC
  Filled 2022-03-20: qty 30

## 2022-03-20 MED ORDER — PROPOFOL 10 MG/ML IV BOLUS
INTRAVENOUS | Status: DC | PRN
  Administered 2022-03-20: 140 mg via INTRAVENOUS

## 2022-03-20 MED ORDER — DEXAMETHASONE SODIUM PHOSPHATE 10 MG/ML IJ SOLN
INTRAMUSCULAR | Status: AC
  Filled 2022-03-20: qty 1

## 2022-03-20 MED ORDER — PHENYLEPHRINE 80 MCG/ML (10ML) SYRINGE FOR IV PUSH (FOR BLOOD PRESSURE SUPPORT)
PREFILLED_SYRINGE | INTRAVENOUS | Status: DC | PRN
  Administered 2022-03-20 (×3): 80 ug via INTRAVENOUS

## 2022-03-20 MED ORDER — CHLORHEXIDINE GLUCONATE CLOTH 2 % EX PADS: 6.0000 | MEDICATED_PAD | Freq: Once | CUTANEOUS | Status: DC

## 2022-03-20 MED ORDER — HEMOSTATIC AGENTS (NO CHARGE) OPTIME
TOPICAL | Status: DC | PRN
  Administered 2022-03-20: 1

## 2022-03-20 MED ORDER — MIDAZOLAM HCL 2 MG/2ML IJ SOLN
INTRAMUSCULAR | Status: DC | PRN
  Administered 2022-03-20: 2 mg via INTRAVENOUS

## 2022-03-20 MED ORDER — CEFAZOLIN SODIUM-DEXTROSE 2-4 GM/100ML-% IV SOLN
INTRAVENOUS | Status: AC
  Filled 2022-03-20: qty 100

## 2022-03-20 MED ORDER — FENTANYL CITRATE (PF) 250 MCG/5ML IJ SOLN
INTRAMUSCULAR | Status: DC | PRN
  Administered 2022-03-20: 25 ug via INTRAVENOUS
  Administered 2022-03-20: 50 ug via INTRAVENOUS
  Administered 2022-03-20: 25 ug via INTRAVENOUS

## 2022-03-20 MED ORDER — ONDANSETRON HCL 4 MG/2ML IJ SOLN
INTRAMUSCULAR | Status: AC
  Filled 2022-03-20: qty 2

## 2022-03-20 MED ORDER — BUPIVACAINE-EPINEPHRINE 0.25% -1:200000 IJ SOLN
INTRAMUSCULAR | Status: DC | PRN
  Administered 2022-03-20: 18 mL

## 2022-03-20 MED ORDER — 0.9 % SODIUM CHLORIDE (POUR BTL) OPTIME
TOPICAL | Status: DC | PRN
  Administered 2022-03-20: 1000 mL

## 2022-03-20 MED ORDER — ORAL CARE MOUTH RINSE: 15.0000 mL | Freq: Once | OROMUCOSAL | Status: AC

## 2022-03-20 MED ORDER — LACTATED RINGERS IV SOLN: INTRAVENOUS | Status: DC

## 2022-03-20 MED ORDER — MIDAZOLAM HCL 2 MG/2ML IJ SOLN
INTRAMUSCULAR | Status: AC
  Filled 2022-03-20: qty 2

## 2022-03-20 SURGICAL SUPPLY — 44 items
ADH SKN CLS APL DERMABOND .7 (GAUZE/BANDAGES/DRESSINGS) ×1
APL PRP STRL LF DISP 70% ISPRP (MISCELLANEOUS) ×1
BAG COUNTER SPONGE SURGICOUNT (BAG) ×1 IMPLANT
BAG SPNG CNTER NS LX DISP (BAG) ×1
BAND INSRT 18 STRL LF DISP RB (MISCELLANEOUS) ×1
BAND RUBBER #18 3X1/16 STRL (MISCELLANEOUS) IMPLANT
CANISTER SUCT 3000ML PPV (MISCELLANEOUS) IMPLANT
CHLORAPREP W/TINT 26 (MISCELLANEOUS) ×1 IMPLANT
CNTNR URN SCR LID CUP LEK RST (MISCELLANEOUS) IMPLANT
CONT SPEC 4OZ STRL OR WHT (MISCELLANEOUS) ×1
COVER PROBE CYLINDRICAL 5X96 (MISCELLANEOUS) IMPLANT
COVER SURGICAL LIGHT HANDLE (MISCELLANEOUS) ×1 IMPLANT
DERMABOND ADVANCED .7 DNX12 (GAUZE/BANDAGES/DRESSINGS) ×1 IMPLANT
DRAPE LAPAROTOMY 100X72 PEDS (DRAPES) ×1 IMPLANT
ELECT REM PT RETURN 9FT ADLT (ELECTROSURGICAL) ×1
ELECTRODE REM PT RTRN 9FT ADLT (ELECTROSURGICAL) ×1 IMPLANT
GAUZE 4X4 16PLY ~~LOC~~+RFID DBL (SPONGE) ×1 IMPLANT
GLOVE BIO SURGEON STRL SZ 6.5 (GLOVE) IMPLANT
GLOVE BIO SURGEON STRL SZ8 (GLOVE) ×1 IMPLANT
GLOVE BIOGEL PI IND STRL 6.5 (GLOVE) IMPLANT
GLOVE BIOGEL PI IND STRL 8 (GLOVE) ×1 IMPLANT
GOWN STRL REUS W/ TWL LRG LVL3 (GOWN DISPOSABLE) ×2 IMPLANT
GOWN STRL REUS W/ TWL XL LVL3 (GOWN DISPOSABLE) ×1 IMPLANT
GOWN STRL REUS W/TWL LRG LVL3 (GOWN DISPOSABLE) ×2
GOWN STRL REUS W/TWL XL LVL3 (GOWN DISPOSABLE) ×2
HEMOSTAT ARISTA ABSORB 3G PWDR (HEMOSTASIS) IMPLANT
KIT BASIN OR (CUSTOM PROCEDURE TRAY) ×1 IMPLANT
KIT TURNOVER KIT B (KITS) ×1 IMPLANT
NDL HYPO 25GX1X1/2 BEV (NEEDLE) ×1 IMPLANT
NEEDLE HYPO 25GX1X1/2 BEV (NEEDLE) ×1 IMPLANT
NS IRRIG 1000ML POUR BTL (IV SOLUTION) ×1 IMPLANT
PACK GENERAL/GYN (CUSTOM PROCEDURE TRAY) ×1 IMPLANT
PAD ARMBOARD 7.5X6 YLW CONV (MISCELLANEOUS) ×2 IMPLANT
PENCIL SMOKE EVACUATOR (MISCELLANEOUS) ×1 IMPLANT
SPIKE FLUID TRANSFER (MISCELLANEOUS) ×1 IMPLANT
SUT MNCRL AB 4-0 PS2 18 (SUTURE) ×1 IMPLANT
SUT VIC AB 2-0 SH 27 (SUTURE) ×1
SUT VIC AB 2-0 SH 27X BRD (SUTURE) ×1 IMPLANT
SUT VIC AB 3-0 SH 27 (SUTURE) ×1
SUT VIC AB 3-0 SH 27X BRD (SUTURE) ×1 IMPLANT
SUT VIC AB 3-0 SH 27XBRD (SUTURE) IMPLANT
SYR CONTROL 10ML LL (SYRINGE) ×1 IMPLANT
TOWEL GREEN STERILE (TOWEL DISPOSABLE) ×1 IMPLANT
TOWEL GREEN STERILE FF (TOWEL DISPOSABLE) ×1 IMPLANT

## 2022-03-20 NOTE — Op Note (Signed)
Preoperative diagnosis: Left axillary lymphadenopathy  Postoperative diagnosis: Same  Procedure: Left axillary seed localized lymph node biopsy  Surgeon: Erroll Luna, MD  Assistant: Dr. Sheldon Silvan MD  I was personally present during the key and critical portions of this procedure and immediately available throughout the entire procedure, as documented in my operative note.     Anesthesia: LMA with 0.25% Marcaine with epinephrine  EBL: Minimal  Specimen: Left axillar lymph nodes including the seed localized node to pathology.  Indications for procedure: The patient is a 71 year old male who has multiple malignancies.  He developed left axillar adenopathy and core biopsy was nondiagnostic.  He presents for formal left axillar lymph node biopsy.  A seed was placed to help facilitate removal of the node that was initially biopsied.  Risks and benefits and long-term expectations of the procedure reviewed.  Lymphedema discussed as well as shoulder dysfunction and pain.The procedure has been discussed with the patient.  Alternative therapies have been discussed with the patient.  Operative risks include bleeding,  Infection,  Organ injury,  Nerve injury,  Blood vessel injury,  DVT,  Pulmonary embolism,  Death,  And possible reoperation.  Medical management risks include worsening of present situation.  The success of the procedure is 50 -90 % at treating patients symptoms.  The patient understands and agrees to proceed.     Description of procedure: The patient is buttonholed area and questions answered.  Previous seed was placed into the biopsied left axillary lymph node.  Risk and benefits reviewed and agreed to proceed.  He was taken back to the operating room.  He is placed supine upon the OR table.  After induction of general esthesia, left axilla was prepped and draped in sterile fashion and timeout performed.  Incision was made in the left axilla along the inferior border of the  hairline.  Dissection was carried down into the level 1 contents.  There were multiple large nodes and these were taken together with the seed node as well.  Neoprobe used to help facilitate localization.  The seed came out of the node partially and when had removed to prevent it being lost.  These nodes were then imaged.  The seed was imaged.  There is no other significant adenopathy in the left axilla area irrigation was used.  Local anesthetic infiltrated.  Arista placed the wound closed with a deep layer 3-0 Vicryl.  4 Monocryl used to close the skin.  Dermabond applied.  All counts found to be correct.  The patient was awoke extubated taken to recovery in satisfactory condition.

## 2022-03-20 NOTE — Transfer of Care (Signed)
Immediate Anesthesia Transfer of Care Note  Patient: Jesse [name]  Procedure(s) Performed: RADIOACTIVE SEED GUIDED LEFT AXILLARY SENTINEL LYMPH NODE BIOPSY (Left)  Patient Location: PACU  Anesthesia Type:General  Level of Consciousness: awake, alert , and oriented  Airway & Oxygen Therapy: Patient Spontanous Breathing and Patient connected to face mask oxygen  Post-op Assessment: Report given to RN and Post -op Vital signs reviewed and stable  Post vital signs: Reviewed and stable  Last Vitals:  Vitals Value Taken Time  BP 129/75 03/20/22 1215  Temp    Pulse 59 03/20/22 1219  Resp 10 03/20/22 1219  SpO2 100 % 03/20/22 1219  Vitals shown include unvalidated device data.  Last Pain:  Vitals:   03/20/22 0929  TempSrc:   PainSc: 0-No pain         Complications: There were no known notable events for this encounter.

## 2022-03-20 NOTE — Interval H&P Note (Signed)
History and Physical Interval Note:  03/20/2022 9:29 AM  Jesse [name]  has presented today for surgery, with the diagnosis of LEFT AXILLARY LA.  The various methods of treatment have been discussed with the patient and family. After consideration of risks, benefits and other options for treatment, the patient has consented to  Procedure(s): RADIOACTIVE SEED GUIDED LEFT AXILLARY SENTINEL LYMPH NODE BIOPSY (Left) as a surgical intervention.  The patient's history has been reviewed, patient examined, no change in status, stable for surgery.  I have reviewed the patient's chart and labs.  Questions were answered to the patient's satisfaction.   Sentinel lymph node mapping and dissection has been discussed with the patient.  Risk of bleeding,  Infection,  Seroma formation,  Additional procedures,,  Shoulder weakness ,  Shoulder stiffness,  Nerve and blood vessel injury and reaction to the mapping dyes have been discussed.  Alternatives to surgery have been discussed with the patient.  The patient agrees to proceed.   [name]

## 2022-03-20 NOTE — Discharge Instructions (Signed)
#######################################################  GENERAL SURGERY: POST OP INSTRUCTIONS  ######################################################################  EAT Gradually transition to a high fiber diet with a fiber supplement over the next few weeks after discharge.  Start with a pureed / full liquid diet (see below)  WALK Walk an hour a day.  Control your pain to do that.    CONTROL PAIN Control pain so that you can walk, sleep, tolerate sneezing/coughing, go up/down stairs.  HAVE A BOWEL MOVEMENT DAILY Keep your bowels regular to avoid problems.  OK to try a laxative to override constipation.  OK to use an antidairrheal to slow down diarrhea.  Call if not better after 2 tries  CALL IF YOU HAVE PROBLEMS/CONCERNS Call if you are still struggling despite following these instructions. Call if you have concerns not answered by these instructions  ######################################################################    DIET: Follow a light bland diet & liquids the first 24 hours after arrival home, such as soup, liquids, starches, etc.  Be sure to drink plenty of fluids.  Quickly advance to a usual solid diet within a few days.  Avoid fast food or heavy meals as your are more likely to get nauseated or have irregular bowels.  A low-fat, high-fiber diet for the rest of your life is ideal.    Take your usually prescribed home medications unless otherwise directed.  PAIN CONTROL: Pain is best controlled by a usual combination of three different methods TOGETHER: Ice/Heat Over the counter pain medication Prescription pain medication Most patients will experience some swelling and bruising around the incisions.  Ice packs or heating pads (30-60 minutes up to 6 times a day) will help. Use ice for the first few days to help decrease swelling and bruising, then switch to heat to help relax tight/sore spots and speed recovery.  Some people prefer to use ice alone, heat alone,  alternating between ice & heat.  Experiment to what works for you.  Swelling and bruising can take several weeks to resolve.   It is helpful to take an over-the-counter pain medication regularly for the first few weeks.  Choose one of the following that works best for you: Naproxen (Aleve, etc)  Two 220mg  tabs twice a day Ibuprofen (Advil, etc) Three 200mg  tabs four times a day (every meal & bedtime) Acetaminophen (Tylenol, etc) 500-650mg  four times a day (every meal & bedtime) A  prescription for pain medication (such as oxycodone, hydrocodone, etc) should be given to you upon discharge.  Take your pain medication as prescribed.  If you are having problems/concerns with the prescription medicine (does not control pain, nausea, vomiting, rash, itching, etc), please call us [phone] to see if we need to switch you to a different pain medicine that will work better for you and/or control your side effect better. If you need a refill on your pain medication, please contact your pharmacy.  They will contact our office to request authorization. Prescriptions will not be filled after 5 pm or on week-ends.  Avoid getting constipated.  Between the surgery and the pain medications, it is common to experience some constipation.  Increasing fluid intake and taking a fiber supplement (such as Metamucil, Citrucel, FiberCon, MiraLax, etc) 1-2 times a day regularly will usually help prevent this problem from occurring.  A mild laxative (prune juice, Milk of Magnesia, MiraLax, etc) should be taken according to package directions if there are no bowel movements after 48 hours.   Watch out for diarrhea.  If you have many loose bowel movements, simplify your  diet to bland foods & liquids for a few days.  Stop any stool softeners and decrease your fiber supplement.  Switching to mild anti-diarrheal medications (Loperamide/Imodium, Kayopectate, Pepto Bismol) can help.  If this worsens or does not improve, please call  us.  Wash / shower every day.  You may shower over the dressings as they are waterproof.  Continue to shower over incision(s) after the dressing is off. Remove your waterproof bandages 5 days after surgery.  You may leave the incision open to air.  You may have skin tapes (Steri Strips) covering the incision(s).  Leave them on until one week, then remove.  You may replace a dressing/Band-Aid to cover the incision for comfort if you wish.   ACTIVITIES as tolerated:   You may resume regular (light) daily activities beginning the next day--such as daily self-care, walking, climbing stairs--gradually increasing activities as tolerated.  If you can walk 30 minutes without difficulty, it is safe to try more intense activity such as jogging, treadmill, bicycling, low-impact aerobics, swimming, etc. Save the most intensive and strenuous activity for last such as sit-ups, heavy lifting, contact sports, etc  Refrain from any heavy lifting or straining until you are off narcotics for pain control.   DO NOT PUSH THROUGH PAIN.  Let pain be your guide: If it hurts to do something, don't do it.  Pain is your body warning you to avoid that activity for another week until the pain goes down. You may drive when you are no longer taking prescription pain medication, you can comfortably wear a seatbelt, and you can safely maneuver your car and apply brakes. You may have sexual intercourse when it is comfortable.   FOLLOW UP in our office Please call [facility] at [phone] to set up an appointment to see your surgeon in the office for a follow-up appointment approximately 2-3 weeks after your surgery. Make sure that you call for this appointment the day you arrive home to insure a convenient appointment time.  9. IF YOU HAVE DISABILITY OR FAMILY LEAVE FORMS, BRING THEM TO THE OFFICE FOR PROCESSING.  DO NOT GIVE THEM TO YOUR DOCTOR.   WHEN TO CALL us [phone]: Poor pain control Reactions / problems with new  medications (rash/itching, nausea, etc)  Fever over 101.5 F (38.5 C) Worsening swelling or bruising Continued bleeding from incision. Increased pain, redness, or drainage from the incision Difficulty breathing / swallowing   The clinic staff is available to answer your questions during regular business hours (8:30am-5pm).  Please don't hesitate to call and ask to speak to one of our nurses for clinical concerns.   If you have a medical emergency, go to the nearest emergency room or call 911.  A surgeon from [facility] is always on call at the [facility], [address], [place], [place], [place]  [id number] ? MAIN: [phone] ? TOLL FREE: [phone] ?  FAX [phone] [email or web address]  #######################################################

## 2022-03-20 NOTE — Telephone Encounter (Signed)
Called patient regarding upcoming December appointments, patient is notified. 

## 2022-03-20 NOTE — Anesthesia Postprocedure Evaluation (Signed)
Anesthesia Post Note  Patient: Jesse [name]  Procedure(s) Performed: RADIOACTIVE SEED GUIDED LEFT AXILLARY SENTINEL LYMPH NODE BIOPSY (Left)     Patient location during evaluation: PACU Anesthesia Type: General Level of consciousness: awake and alert Pain management: pain level controlled Vital Signs Assessment: post-procedure vital signs reviewed and stable Respiratory status: spontaneous breathing, nonlabored ventilation and respiratory function stable Cardiovascular status: blood pressure returned to baseline Postop Assessment: no apparent nausea or vomiting Anesthetic complications: no   There were no known notable events for this encounter.  Last Vitals:  Vitals:   03/20/22 1230 03/20/22 1245  BP: (!) 141/78 (!) 147/77  Pulse: 68 (!) 59  Resp: 14 14  Temp:  36.7 C  SpO2: 99% 100%    Last Pain:  Vitals:   03/20/22 1245  TempSrc:   PainSc: 0-No pain                 Marthenia Rolling

## 2022-03-20 NOTE — Anesthesia Preprocedure Evaluation (Addendum)
Anesthesia Evaluation  Patient identified by MRN, date of birth, ID band Patient awake    Reviewed: Allergy & Precautions, NPO status , Patient's Chart, lab work & pertinent test results  History of Anesthesia Complications Negative for: history of anesthetic complications  Airway Mallampati: II  TM Distance: >3 FB Neck ROM: Full    Dental  (+) Missing,    Pulmonary COPD, former smoker   Pulmonary exam normal        Cardiovascular hypertension, Pt. on medications + CAD  Normal cardiovascular exam     Neuro/Psych negative neurological ROS     GI/Hepatic Neg liver ROS,GERD  Medicated and Controlled,,  Endo/Other  negative endocrine ROS    Renal/GU negative Renal ROS  negative genitourinary   Musculoskeletal  (+) Arthritis ,    Abdominal   Peds  Hematology  (+) Blood dyscrasia, anemia   Anesthesia Other Findings Day of surgery medications reviewed with patient.  Reproductive/Obstetrics negative OB ROS                             Anesthesia Physical Anesthesia Plan  ASA: 2  Anesthesia Plan: General   Post-op Pain Management: Tylenol PO (pre-op)*   Induction: Intravenous  PONV Risk Score and Plan: 2 and Treatment may vary due to age or medical condition, Dexamethasone and Ondansetron  Airway Management Planned: LMA  Additional Equipment: None  Intra-op Plan:   Post-operative Plan: Extubation in OR  Informed Consent: I have reviewed the patients History and Physical, chart, labs and discussed the procedure including the risks, benefits and alternatives for the proposed anesthesia with the patient or authorized representative who has indicated his/her understanding and acceptance.       Plan Discussed with: CRNA  Anesthesia Plan Comments:        Anesthesia Quick Evaluation

## 2022-03-20 NOTE — H&P (Signed)
Chief Complaint: New Consultation (Lymph Node)   History of Present Illness: Jesse [name] is a 71 y.o. male who is seen today as an office consultation for evaluation of New Consultation (Lymph Node) .   Asked see patient at the request of Dr. Annie Main gross due to left axillary adenopathy. He is a patient of Dr. Johney Maine. History of in the past with robotic assisted low rectosigmoid resection with coloanal anastomosis for GI stromal tumor of the pelvis. He also has been treated for lung cancer as well. He was noted on PET scan to have increased uptake in the left axilla. The PET directed lymph node core biopsy was done which was nondiagnostic. Lymph node biopsy was requested. There was some confusion if a lymph node dissection was requested. I was asked to see him to see if he be a candidate for seed localized lymph node biopsy to hopefully reduce his morbidity but yet to obtain adequate diagnostic information to treat his underlying problem and to see if he has any new cancer on top of his other 2. Of note he underwent a core biopsy guidance.  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   Review of Systems: A complete review of systems was obtained from the patient. I have reviewed this information and discussed as appropriate with the patient. See HPI as well for other ROS.    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  bisacodyL (DULCOLAX) 5 mg EC tablet Take 4 tablets (20 mg total) by mouth once daily as needed for Constipation for up to 1 dose 4 tablet 0  cholecalciferol (VITAMIN D3) 1000 unit tablet Take 1 tablet by mouth once daily  ergocalciferol, vitamin D2, 1,250 mcg (50,000 unit) capsule Take 50,000 Units by mouth every 7 (seven) days  imatinib (GLEEVEC) 400 MG tablet Take by mouth  omeprazole (PRILOSEC) 40 MG DR capsule TAKE 1 CAPSULE (40 MG TOTAL) BY MOUTH DAILY.   No current facility-administered medications on file prior to visit.   Family History  Problem Relation Age of Onset  Obesity Brother  Hyperlipidemia (Elevated cholesterol) Brother    Social History   Tobacco Use  Smoking Status Former  Types: Cigarettes  Quit date: 09/2019  Years since quitting: 2.4  Smokeless Tobacco Never    Social History   Socioeconomic History  Marital status: Married  Tobacco Use  Smoking status: Former  Types: Cigarettes  Quit date: 09/2019  Years since quitting: 2.4  Smokeless tobacco: Never  Vaping Use  Vaping Use: Never used  Substance and Sexual Activity  Alcohol use: Yes  Drug use: Yes  Comment: marijuana   Objective:  There were no vitals filed for this visit.  There is no height or weight on file to calculate BMI.  Physical Exam Abdominal:   Comments: Ostomy   scar  Lymphadenopathy:  Upper Body:  Right upper body: No supraclavicular or axillary adenopathy.  Left upper body: No supraclavicular or axillary adenopathy.  Skin: General: Skin is warm.  Neurological:  General: No focal deficit present.  Mental Status: He is alert.     Labs, Imaging and Diagnostic Testing:  CLINICAL DATA: Subsequent  treatment strategy for stage II non-small cell left upper lobe lung cancer status post left upper lobectomy 12/16/2019, adjuvant chemotherapy. 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  EXAM: NUCLEAR MEDICINE PET SKULL BASE TO THIGH  TECHNIQUE: 7.4 mCi F-18 FDG was injected intravenously. Full-ring PET imaging was performed from the skull base to thigh after the radiotracer. CT data was obtained and used for attenuation correction and anatomic localization.  Fasting blood glucose: 118 mg/dl  COMPARISON: 01/19/2022 chest CT. 03/24/2021 CT chest, abdomen and pelvis. 07/17/2021 MRI pelvis. 09/16/2019 PET-CT.  FINDINGS: Mediastinal blood pool activity: SUV max 3.0  Liver activity: SUV max NA  NECK: No hypermetabolic lymph nodes in the neck.  Incidental CT findings: Hypodense 1.9 cm left thyroid nodule, non hypermetabolic.  CHEST:  Multiple new mildly enlarged hypermetabolic left axillary, mediastinal and left hilar lymph nodes. Representative 1.2 cm left axillary node with max SUV 13.9 (series 4/image 68). Representative high right paratracheal 0.8 cm node with max SUV 6.4 (series 4/image 68). Representative 1.0 cm subcarinal node with max SUV 8.3 (series 4/image 80). Representative 1.4 cm left hilar node with max SUV 9.3 (series 4/image 74).  No enlarged or hypermetabolic right axillary or right hilar nodes. No hypermetabolic pulmonary findings. Non hypermetabolic (max SUV 1.1) mixed cystic and solid 2.2 cm peripheral right lower lobe pulmonary nodule with solid components measuring up to 0.6 cm, technically below PET resolution.  Incidental CT findings: Moderate paraseptal and centrilobular emphysema. Left upper lobectomy. Apical right upper lobe solid 0.6 cm pulmonary nodule is stable since 09/16/2019 PET-CT, below PET resolution and  considered benign. Coronary atherosclerosis. Atherosclerotic thoracic aorta with dilated 4.3 cm ascending thoracic aorta.  ABDOMEN/PELVIS: No abnormal hypermetabolic activity within the liver, pancreas, right adrenal gland, or spleen. No hypermetabolic lymph nodes in the abdomen or pelvis.  Left adrenal 1.4 cm nodule with density 31 HU with mild FDG uptake with max SUV 3.3, stable size since 09/16/2019 PET-CT, most compatible with a benign adenoma.  Status post rectosigmoid resection with colo-anal anastomosis and diverting ileostomy in the ventral right abdominal wall. Nonspecific mild FDG uptake at the colo-anal anastomosis with max SUV 4.9, without discrete mass in this location on the CT images, favoring postsurgical change.  Incidental CT findings: Atherosclerotic nonaneurysmal abdominal aorta. Bilateral vasectomy clips.  SKELETON: No focal hypermetabolic activity to suggest skeletal metastasis.  Incidental CT findings: None.  IMPRESSION: 1. New hypermetabolic left axillary, mediastinal and left hilar lymphadenopathy compatible with recurrent metastatic lung cancer. 2. No hypermetabolic metastatic disease in the neck, abdomen, pelvis or skeleton. 3. Non-hypermetabolic mixed cystic and solid 2.2 cm peripheral right lower lobe pulmonary nodule with solid components up to 0.6 cm. As described on recent chest CT report, given progression of this nodule on the CT images, low-grade adenocarcinoma not excluded. Continued chest CT surveillance warranted at a minimum. 4. Nonspecific mild FDG uptake at the colo-anal anastomosis without discrete mass correlate on the CT images, favoring postsurgical change.  5. Hypodense non hypermetabolic 1.9 cm left thyroid nodule. Recommend non-emergent thyroid ultrasound. Reference: J Am Coll Radiol. 2015 Feb;12(2): 143-50 6. Chronic findings include: Aortic Atherosclerosis (ICD10-I70.0) and Emphysema (ICD10-J43.9). Coronary atherosclerosis.  Stable small left adrenal adenoma, for which no follow-up is recommended. 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 Recommend annual imaging followup by CTA or MRA. This recommendation follows 2010 ACCF/AHA/AATS/ACR/ASA/SCA/SCAI/SIR/STS/SVM Guidelines for the Diagnosis and Management of Patients with Thoracic Aortic Disease. Circulation. 2010; 121: A309-M076. Aortic aneurysm NOS (ICD10-I71.9).   Electronically Signed By: Ilona Sorrel M.D. On: 02/04/2022 12:54  Lymph node, left axilla, flow cytometry:  - No immunophenotypic evidence of a lymphoproliferative disorder (i.e.  no monoclonal B cells or immunophenotypically abnormal T cells  detected).   GATING AND PHENOTYPIC ANALYSIS:   Gated population: Flow cytometric immunophenotyping is performed using  antibodies to the antigens listed in the table below. Electronic gates  are placed around a cell cluster displaying light scatter properties  corresponding to: lymphocytes   Assessment and Plan:   Diagnoses and all orders for this visit:  Lymphadenopathy    Reviewed PET scan. This is most likely area of uptake. Previous biopsy was nondiagnostic. The conundrum for oncology is to sort out if this is metastatic lung cancer, GI stromal progression which would be less likely, or new lymphoma or other malignant process. There is some confusion if a lymph node dissection was requested which would not be helpful from the standpoint of overall survival especially in light of metastatic disease. I was asked to see if he be a candidate for seed localized lymph node biopsy. I do not see evidence of where the left a clip in the node therefore might be difficult but certainly we could have radiology look at this to see if a seed to be placed in the same node that was biopsied. If not, I recommend just a simple open lymph node biopsy. Diagnostic yield in this circumstance since there is nothing palpable might be low. I explained that he might need  additional procedures done especially if the lymph nodes all appear normal since it is difficult to detect which ones were enhanced by the PET procedure. He understands risk of lymphedema, nerve damage, blood vessel damage, and shoulder stiffness. Plan is to proceed with an attempted seed localization of possible; if not, proceed with open left axillar lymph node biopsy.  No follow-ups on file.  Kennieth Francois, MD

## 2022-03-20 NOTE — Anesthesia Procedure Notes (Signed)
Procedure Name: LMA Insertion Date/Time: 03/20/2022 11:07 AM  Performed by: Ester Rink, CRNAPre-anesthesia Checklist: Patient identified, Emergency Drugs available, Suction available and Patient being monitored Patient Re-evaluated:Patient Re-evaluated prior to induction Oxygen Delivery Method: Circle system utilized Preoxygenation: Pre-oxygenation with 100% oxygen Induction Type: IV induction Ventilation: Mask ventilation without difficulty LMA: LMA inserted LMA Size: 5.0 Tube type: Oral Number of attempts: 1 Placement Confirmation: positive ETCO2 and breath sounds checked- equal and bilateral Tube secured with: Tape Dental Injury: Teeth and Oropharynx as per pre-operative assessment

## 2022-03-21 ENCOUNTER — Encounter (HOSPITAL_COMMUNITY): Payer: Self-pay | Admitting: Surgery

## 2022-03-22 ENCOUNTER — Other Ambulatory Visit: Payer: Self-pay

## 2022-03-22 ENCOUNTER — Inpatient Hospital Stay (HOSPITAL_COMMUNITY): Payer: Medicare Other | Admitting: Anesthesiology

## 2022-03-22 ENCOUNTER — Inpatient Hospital Stay (HOSPITAL_COMMUNITY)
Admission: RE | Admit: 2022-03-22 | Discharge: 2022-03-26 | DRG: 330 | Disposition: A | Discharge status: Home / Self Care | Payer: Medicare Other | Source: Ambulatory Visit | Attending: Surgery | Admitting: Surgery

## 2022-03-22 ENCOUNTER — Encounter (HOSPITAL_COMMUNITY)
Admission: RE | Disposition: A | Discharge status: Home / Self Care | Payer: Self-pay | Source: Ambulatory Visit | Attending: Surgery

## 2022-03-22 ENCOUNTER — Encounter (HOSPITAL_COMMUNITY): Payer: Self-pay | Admitting: Surgery

## 2022-03-22 DIAGNOSIS — Z433 Encounter for attention to colostomy: Secondary | ICD-10-CM | POA: Diagnosis not present

## 2022-03-22 DIAGNOSIS — K219 Gastro-esophageal reflux disease without esophagitis: Secondary | ICD-10-CM | POA: Diagnosis present

## 2022-03-22 DIAGNOSIS — D649 Anemia, unspecified: Secondary | ICD-10-CM | POA: Diagnosis present

## 2022-03-22 DIAGNOSIS — I2510 Atherosclerotic heart disease of native coronary artery without angina pectoris: Secondary | ICD-10-CM | POA: Diagnosis present

## 2022-03-22 DIAGNOSIS — Z79811 Long term (current) use of aromatase inhibitors: Secondary | ICD-10-CM

## 2022-03-22 DIAGNOSIS — Z902 Acquired absence of lung [part of]: Secondary | ICD-10-CM | POA: Diagnosis not present

## 2022-03-22 DIAGNOSIS — C3492 Malignant neoplasm of unspecified part of left bronchus or lung: Secondary | ICD-10-CM | POA: Diagnosis present

## 2022-03-22 DIAGNOSIS — Z9889 Other specified postprocedural states: Secondary | ICD-10-CM

## 2022-03-22 DIAGNOSIS — C49A5 Gastrointestinal stromal tumor of rectum: Secondary | ICD-10-CM | POA: Diagnosis present

## 2022-03-22 DIAGNOSIS — Q859 Phakomatosis, unspecified: Secondary | ICD-10-CM

## 2022-03-22 DIAGNOSIS — Z79899 Other long term (current) drug therapy: Secondary | ICD-10-CM | POA: Diagnosis not present

## 2022-03-22 DIAGNOSIS — E876 Hypokalemia: Secondary | ICD-10-CM | POA: Diagnosis not present

## 2022-03-22 DIAGNOSIS — M1990 Unspecified osteoarthritis, unspecified site: Secondary | ICD-10-CM | POA: Diagnosis present

## 2022-03-22 DIAGNOSIS — K660 Peritoneal adhesions (postprocedural) (postinfection): Secondary | ICD-10-CM | POA: Diagnosis present

## 2022-03-22 DIAGNOSIS — I10 Essential (primary) hypertension: Secondary | ICD-10-CM | POA: Diagnosis present

## 2022-03-22 DIAGNOSIS — Z432 Encounter for attention to ileostomy: Secondary | ICD-10-CM

## 2022-03-22 DIAGNOSIS — Z96653 Presence of artificial knee joint, bilateral: Secondary | ICD-10-CM | POA: Diagnosis present

## 2022-03-22 DIAGNOSIS — J449 Chronic obstructive pulmonary disease, unspecified: Secondary | ICD-10-CM | POA: Diagnosis present

## 2022-03-22 DIAGNOSIS — I890 Lymphedema, not elsewhere classified: Secondary | ICD-10-CM | POA: Diagnosis present

## 2022-03-22 DIAGNOSIS — Z8551 Personal history of malignant neoplasm of bladder: Secondary | ICD-10-CM

## 2022-03-22 DIAGNOSIS — E785 Hyperlipidemia, unspecified: Secondary | ICD-10-CM | POA: Diagnosis present

## 2022-03-22 DIAGNOSIS — K9409 Other complications of colostomy: Secondary | ICD-10-CM | POA: Diagnosis not present

## 2022-03-22 DIAGNOSIS — Z87891 Personal history of nicotine dependence: Secondary | ICD-10-CM | POA: Diagnosis not present

## 2022-03-22 DIAGNOSIS — C49A0 Gastrointestinal stromal tumor, unspecified site: Secondary | ICD-10-CM | POA: Diagnosis present

## 2022-03-22 DIAGNOSIS — Z8249 Family history of ischemic heart disease and other diseases of the circulatory system: Secondary | ICD-10-CM | POA: Diagnosis not present

## 2022-03-22 HISTORY — PX: RECTAL EXAM UNDER ANESTHESIA: SHX6399

## 2022-03-22 HISTORY — PX: ILEOSTOMY CLOSURE: SHX1784

## 2022-03-22 LAB — CBC
HCT: 31.3 % — ABNORMAL LOW (ref 39.0–52.0)
Hemoglobin: 10.5 g/dL — ABNORMAL LOW (ref 13.0–17.0)
MCH: 33 pg (ref 26.0–34.0)
MCHC: 33.5 g/dL (ref 30.0–36.0)
MCV: 98.4 fL (ref 80.0–100.0)
Platelets: 144 10*3/uL — ABNORMAL LOW (ref 150–400)
RBC: 3.18 MIL/uL — ABNORMAL LOW (ref 4.22–5.81)
RDW: 15.1 % (ref 11.5–15.5)
WBC: 9.6 10*3/uL (ref 4.0–10.5)
nRBC: 0 % (ref 0.0–0.2)

## 2022-03-22 LAB — CREATININE, SERUM
Creatinine, Ser: 0.84 mg/dL (ref 0.61–1.24)
GFR, Estimated: >60 mL/min (ref >60)

## 2022-03-22 SURGERY — CLOSURE, ILEOSTOMY
Anesthesia: General

## 2022-03-22 MED ORDER — GLYCOPYRROLATE 0.2 MG/ML IJ SOLN
INTRAMUSCULAR | Status: AC
  Filled 2022-03-22: qty 1

## 2022-03-22 MED ORDER — ENOXAPARIN SODIUM 40 MG/0.4ML IJ SOSY
40.0000 mg | PREFILLED_SYRINGE | INTRAMUSCULAR | Status: DC
  Administered 2022-03-23 – 2022-03-26 (×4): 40 mg via SUBCUTANEOUS
  Filled 2022-03-22 (×4): qty 0.4

## 2022-03-22 MED ORDER — LACTATED RINGERS IV SOLN: INTRAVENOUS | Status: DC

## 2022-03-22 MED ORDER — SODIUM CHLORIDE 0.9% FLUSH: 3.0000 mL | INTRAVENOUS | Status: DC | PRN

## 2022-03-22 MED ORDER — ATORVASTATIN CALCIUM 20 MG PO TABS
20.0000 mg | ORAL_TABLET | Freq: Every day | ORAL | Status: DC
  Administered 2022-03-23 – 2022-03-26 (×4): 20 mg via ORAL
  Filled 2022-03-22 (×5): qty 1

## 2022-03-22 MED ORDER — ENSURE SURGERY PO LIQD
237.0000 mL | Freq: Two times a day (BID) | ORAL | Status: DC
  Administered 2022-03-23 – 2022-03-26 (×6): 237 mL via ORAL

## 2022-03-22 MED ORDER — FENTANYL CITRATE (PF) 250 MCG/5ML IJ SOLN
INTRAMUSCULAR | Status: DC | PRN
  Administered 2022-03-22 (×5): 50 ug via INTRAVENOUS

## 2022-03-22 MED ORDER — ONDANSETRON HCL 4 MG PO TABS: 4.0000 mg | ORAL_TABLET | Freq: Four times a day (QID) | ORAL | Status: DC | PRN

## 2022-03-22 MED ORDER — LIP MEDEX EX OINT
TOPICAL_OINTMENT | Freq: Two times a day (BID) | CUTANEOUS | Status: DC
  Administered 2022-03-25: 1 via TOPICAL
  Filled 2022-03-22: qty 7

## 2022-03-22 MED ORDER — ROCURONIUM BROMIDE 10 MG/ML (PF) SYRINGE
PREFILLED_SYRINGE | INTRAVENOUS | Status: AC
  Filled 2022-03-22: qty 10

## 2022-03-22 MED ORDER — ONDANSETRON HCL 4 MG/2ML IJ SOLN
INTRAMUSCULAR | Status: AC
  Filled 2022-03-22: qty 2

## 2022-03-22 MED ORDER — OXYCODONE HCL 5 MG PO TABS
5.0000 mg | ORAL_TABLET | ORAL | Status: DC | PRN
  Administered 2022-03-22: 5 mg via ORAL

## 2022-03-22 MED ORDER — CALCIUM POLYCARBOPHIL 625 MG PO TABS
625.0000 mg | ORAL_TABLET | Freq: Two times a day (BID) | ORAL | Status: DC
  Administered 2022-03-22 – 2022-03-26 (×8): 625 mg via ORAL
  Filled 2022-03-22 (×8): qty 1

## 2022-03-22 MED ORDER — PROPOFOL 10 MG/ML IV BOLUS
INTRAVENOUS | Status: DC | PRN
  Administered 2022-03-22: 130 mg via INTRAVENOUS

## 2022-03-22 MED ORDER — MORPHINE SULFATE (PF) 2 MG/ML IV SOLN
INTRAVENOUS | Status: AC
  Filled 2022-03-22: qty 1

## 2022-03-22 MED ORDER — METOPROLOL TARTRATE 5 MG/5ML IV SOLN: 5.0000 mg | Freq: Four times a day (QID) | INTRAVENOUS | Status: DC | PRN

## 2022-03-22 MED ORDER — SODIUM CHLORIDE 0.9 % IV SOLN
2.0000 g | Freq: Two times a day (BID) | INTRAVENOUS | Status: AC
  Administered 2022-03-22: 2 g via INTRAVENOUS
  Filled 2022-03-22: qty 2

## 2022-03-22 MED ORDER — PROSIGHT PO TABS
1.0000 | ORAL_TABLET | Freq: Every day | ORAL | Status: DC
  Administered 2022-03-23 – 2022-03-26 (×4): 1 via ORAL
  Filled 2022-03-22 (×4): qty 1

## 2022-03-22 MED ORDER — SIMETHICONE 80 MG PO CHEW: 40.0000 mg | CHEWABLE_TABLET | Freq: Four times a day (QID) | ORAL | Status: DC | PRN

## 2022-03-22 MED ORDER — DIPHENHYDRAMINE HCL 12.5 MG/5ML PO ELIX: 12.5000 mg | ORAL_SOLUTION | Freq: Four times a day (QID) | ORAL | Status: DC | PRN

## 2022-03-22 MED ORDER — LACTATED RINGERS IV BOLUS: 1000.0000 mL | Freq: Once | INTRAVENOUS | Status: DC

## 2022-03-22 MED ORDER — GABAPENTIN 100 MG PO CAPS
200.0000 mg | ORAL_CAPSULE | Freq: Three times a day (TID) | ORAL | Status: DC
  Administered 2022-03-22 – 2022-03-26 (×12): 200 mg via ORAL
  Filled 2022-03-22 (×12): qty 2

## 2022-03-22 MED ORDER — DEXAMETHASONE SODIUM PHOSPHATE 10 MG/ML IJ SOLN
INTRAMUSCULAR | Status: DC | PRN
  Administered 2022-03-22: 10 mg via INTRAVENOUS

## 2022-03-22 MED ORDER — ONDANSETRON HCL 4 MG/2ML IJ SOLN
INTRAMUSCULAR | Status: DC | PRN
  Administered 2022-03-22: 4 mg via INTRAVENOUS

## 2022-03-22 MED ORDER — ACETAMINOPHEN 325 MG PO TABS: 650.0000 mg | ORAL_TABLET | Freq: Four times a day (QID) | ORAL | Status: DC | PRN

## 2022-03-22 MED ORDER — MIDAZOLAM HCL 2 MG/2ML IJ SOLN
INTRAMUSCULAR | Status: DC | PRN
  Administered 2022-03-22: 2 mg via INTRAVENOUS

## 2022-03-22 MED ORDER — SODIUM CHLORIDE 0.9 % IV SOLN: 8.0000 mg | Freq: Four times a day (QID) | INTRAVENOUS | Status: DC | PRN

## 2022-03-22 MED ORDER — MORPHINE SULFATE (PF) 2 MG/ML IV SOLN
2.0000 mg | INTRAVENOUS | Status: DC | PRN
  Administered 2022-03-22: 2 mg via INTRAVENOUS

## 2022-03-22 MED ORDER — BUPIVACAINE LIPOSOME 1.3 % IJ SUSP: 20.0000 mL | Freq: Once | INTRAMUSCULAR | Status: DC

## 2022-03-22 MED ORDER — ENOXAPARIN SODIUM 40 MG/0.4ML IJ SOSY: 40.0000 mg | PREFILLED_SYRINGE | INTRAMUSCULAR | Status: DC

## 2022-03-22 MED ORDER — 0.9 % SODIUM CHLORIDE (POUR BTL) OPTIME
TOPICAL | Status: DC | PRN
  Administered 2022-03-22: 1000 mL

## 2022-03-22 MED ORDER — LIDOCAINE 2% (20 MG/ML) 5 ML SYRINGE
INTRAMUSCULAR | Status: DC | PRN
  Administered 2022-03-22: 60 mg via INTRAVENOUS

## 2022-03-22 MED ORDER — PRESERVISION AREDS 2 PO CAPS: 1.0000 | ORAL_CAPSULE | Freq: Two times a day (BID) | ORAL | Status: DC

## 2022-03-22 MED ORDER — PROPOFOL 10 MG/ML IV BOLUS
INTRAVENOUS | Status: AC
  Filled 2022-03-22: qty 20

## 2022-03-22 MED ORDER — DEXAMETHASONE SODIUM PHOSPHATE 10 MG/ML IJ SOLN
INTRAMUSCULAR | Status: AC
  Filled 2022-03-22: qty 1

## 2022-03-22 MED ORDER — AMISULPRIDE (ANTIEMETIC) 5 MG/2ML IV SOLN
10.0000 mg | Freq: Once | INTRAVENOUS | Status: AC
  Administered 2022-03-22: 10 mg via INTRAVENOUS

## 2022-03-22 MED ORDER — SODIUM CHLORIDE 0.9% FLUSH
3.0000 mL | Freq: Two times a day (BID) | INTRAVENOUS | Status: DC
  Administered 2022-03-22 – 2022-03-26 (×6): 3 mL via INTRAVENOUS

## 2022-03-22 MED ORDER — EPHEDRINE 5 MG/ML INJ
INTRAVENOUS | Status: AC
  Filled 2022-03-22: qty 5

## 2022-03-22 MED ORDER — DIPHENHYDRAMINE HCL 50 MG/ML IJ SOLN: 12.5000 mg | Freq: Four times a day (QID) | INTRAMUSCULAR | Status: DC | PRN

## 2022-03-22 MED ORDER — PHENYLEPHRINE 80 MCG/ML (10ML) SYRINGE FOR IV PUSH (FOR BLOOD PRESSURE SUPPORT)
PREFILLED_SYRINGE | INTRAVENOUS | Status: AC
  Filled 2022-03-22: qty 10

## 2022-03-22 MED ORDER — OXYCODONE HCL 5 MG PO TABS
ORAL_TABLET | ORAL | Status: AC
  Filled 2022-03-22: qty 1

## 2022-03-22 MED ORDER — OXYCODONE HCL 5 MG PO TABS: 5.0000 mg | ORAL_TABLET | Freq: Four times a day (QID) | ORAL | 0 refills | Status: DC | PRN

## 2022-03-22 MED ORDER — BUPIVACAINE LIPOSOME 1.3 % IJ SUSP
INTRAMUSCULAR | Status: DC | PRN
  Administered 2022-03-22: 20 mL

## 2022-03-22 MED ORDER — AMISULPRIDE (ANTIEMETIC) 5 MG/2ML IV SOLN
INTRAVENOUS | Status: AC
  Filled 2022-03-22: qty 4

## 2022-03-22 MED ORDER — CHLORHEXIDINE GLUCONATE 0.12 % MT SOLN
15.0000 mL | Freq: Once | OROMUCOSAL | Status: AC
  Administered 2022-03-22: 15 mL via OROMUCOSAL

## 2022-03-22 MED ORDER — FENTANYL CITRATE (PF) 250 MCG/5ML IJ SOLN
INTRAMUSCULAR | Status: AC
  Filled 2022-03-22: qty 5

## 2022-03-22 MED ORDER — METHOCARBAMOL 1000 MG/10ML IJ SOLN: 1000.0000 mg | Freq: Four times a day (QID) | INTRAVENOUS | Status: DC | PRN

## 2022-03-22 MED ORDER — ALVIMOPAN 12 MG PO CAPS
12.0000 mg | ORAL_CAPSULE | ORAL | Status: AC
  Administered 2022-03-22: 12 mg via ORAL
  Filled 2022-03-22: qty 1

## 2022-03-22 MED ORDER — ENOXAPARIN SODIUM 40 MG/0.4ML IJ SOSY
40.0000 mg | PREFILLED_SYRINGE | Freq: Once | INTRAMUSCULAR | Status: AC
  Administered 2022-03-22: 40 mg via SUBCUTANEOUS
  Filled 2022-03-22: qty 0.4

## 2022-03-22 MED ORDER — CHLORHEXIDINE GLUCONATE CLOTH 2 % EX PADS: 6.0000 | MEDICATED_PAD | Freq: Once | CUTANEOUS | Status: DC

## 2022-03-22 MED ORDER — PANTOPRAZOLE SODIUM 40 MG PO TBEC
40.0000 mg | DELAYED_RELEASE_TABLET | Freq: Every day | ORAL | Status: DC
  Administered 2022-03-23 – 2022-03-26 (×4): 40 mg via ORAL
  Filled 2022-03-22 (×4): qty 1

## 2022-03-22 MED ORDER — HYDROMORPHONE HCL 1 MG/ML IJ SOLN: 0.5000 mg | INTRAMUSCULAR | Status: DC | PRN

## 2022-03-22 MED ORDER — TRAMADOL HCL 50 MG PO TABS: 50.0000 mg | ORAL_TABLET | Freq: Four times a day (QID) | ORAL | Status: DC | PRN

## 2022-03-22 MED ORDER — IMATINIB MESYLATE 100 MG PO TABS
400.0000 mg | ORAL_TABLET | Freq: Every day | ORAL | Status: DC
  Filled 2022-03-22: qty 4

## 2022-03-22 MED ORDER — MIDAZOLAM HCL 2 MG/2ML IJ SOLN
INTRAMUSCULAR | Status: AC
  Filled 2022-03-22: qty 2

## 2022-03-22 MED ORDER — ONDANSETRON HCL 4 MG/2ML IJ SOLN: 4.0000 mg | Freq: Four times a day (QID) | INTRAMUSCULAR | Status: DC | PRN

## 2022-03-22 MED ORDER — OXYCODONE HCL 5 MG PO TABS
5.0000 mg | ORAL_TABLET | Freq: Four times a day (QID) | ORAL | Status: DC | PRN
  Administered 2022-03-23 – 2022-03-24 (×3): 5 mg via ORAL
  Filled 2022-03-22 (×3): qty 1

## 2022-03-22 MED ORDER — BUPIVACAINE-EPINEPHRINE (PF) 0.5% -1:200000 IJ SOLN
INTRAMUSCULAR | Status: AC
  Filled 2022-03-22: qty 30

## 2022-03-22 MED ORDER — ALVIMOPAN 12 MG PO CAPS
12.0000 mg | ORAL_CAPSULE | Freq: Two times a day (BID) | ORAL | Status: DC
  Administered 2022-03-23 – 2022-03-24 (×3): 12 mg via ORAL
  Filled 2022-03-22 (×3): qty 1

## 2022-03-22 MED ORDER — ALUM & MAG HYDROXIDE-SIMETH 200-200-20 MG/5ML PO SUSP: 30.0000 mL | Freq: Four times a day (QID) | ORAL | Status: DC | PRN

## 2022-03-22 MED ORDER — SODIUM CHLORIDE 0.9 % IV SOLN
2.0000 g | INTRAVENOUS | Status: AC
  Administered 2022-03-22: 2 g via INTRAVENOUS
  Filled 2022-03-22: qty 2

## 2022-03-22 MED ORDER — HYDROMORPHONE HCL 1 MG/ML IJ SOLN: 0.2500 mg | INTRAMUSCULAR | Status: DC | PRN

## 2022-03-22 MED ORDER — LIDOCAINE HCL (PF) 2 % IJ SOLN
INTRAMUSCULAR | Status: AC
  Filled 2022-03-22: qty 5

## 2022-03-22 MED ORDER — BUPIVACAINE-EPINEPHRINE 0.5% -1:200000 IJ SOLN
INTRAMUSCULAR | Status: DC | PRN
  Administered 2022-03-22: 30 mL

## 2022-03-22 MED ORDER — ALBUTEROL SULFATE (2.5 MG/3ML) 0.083% IN NEBU: 2.5000 mg | INHALATION_SOLUTION | Freq: Four times a day (QID) | RESPIRATORY_TRACT | Status: DC | PRN

## 2022-03-22 MED ORDER — AMLODIPINE BESYLATE 5 MG PO TABS
5.0000 mg | ORAL_TABLET | Freq: Every day | ORAL | Status: DC
  Administered 2022-03-23 – 2022-03-26 (×4): 5 mg via ORAL
  Filled 2022-03-22 (×4): qty 1

## 2022-03-22 MED ORDER — GABAPENTIN 300 MG PO CAPS
300.0000 mg | ORAL_CAPSULE | ORAL | Status: AC
  Administered 2022-03-22: 300 mg via ORAL
  Filled 2022-03-22: qty 1

## 2022-03-22 MED ORDER — LIDOCAINE HCL 2 % IJ SOLN
INTRAMUSCULAR | Status: AC
  Filled 2022-03-22: qty 20

## 2022-03-22 MED ORDER — ACETAMINOPHEN 500 MG PO TABS
1000.0000 mg | ORAL_TABLET | Freq: Four times a day (QID) | ORAL | Status: DC
  Administered 2022-03-22 – 2022-03-24 (×6): 1000 mg via ORAL
  Filled 2022-03-22 (×6): qty 2

## 2022-03-22 MED ORDER — GLYCOPYRROLATE 0.2 MG/ML IJ SOLN
INTRAMUSCULAR | Status: DC | PRN
  Administered 2022-03-22: .2 mg via INTRAVENOUS

## 2022-03-22 MED ORDER — HYDRALAZINE HCL 20 MG/ML IJ SOLN: 5.0000 mg | INTRAMUSCULAR | Status: DC | PRN

## 2022-03-22 MED ORDER — PROCHLORPERAZINE EDISYLATE 10 MG/2ML IJ SOLN: 5.0000 mg | INTRAMUSCULAR | Status: DC | PRN

## 2022-03-22 MED ORDER — ROCURONIUM BROMIDE 10 MG/ML (PF) SYRINGE
PREFILLED_SYRINGE | INTRAVENOUS | Status: DC | PRN
  Administered 2022-03-22: 10 mg via INTRAVENOUS
  Administered 2022-03-22: 60 mg via INTRAVENOUS

## 2022-03-22 MED ORDER — SUGAMMADEX SODIUM 200 MG/2ML IV SOLN
INTRAVENOUS | Status: DC | PRN
  Administered 2022-03-22: 150 mg via INTRAVENOUS

## 2022-03-22 MED ORDER — MAGIC MOUTHWASH: 15.0000 mL | Freq: Four times a day (QID) | ORAL | Status: DC | PRN

## 2022-03-22 MED ORDER — ENSURE PRE-SURGERY PO LIQD: 296.0000 mL | Freq: Once | ORAL | Status: DC

## 2022-03-22 MED ORDER — ENSURE PRE-SURGERY PO LIQD: 592.0000 mL | Freq: Once | ORAL | Status: DC

## 2022-03-22 MED ORDER — METHOCARBAMOL 500 MG PO TABS
1000.0000 mg | ORAL_TABLET | Freq: Four times a day (QID) | ORAL | Status: DC | PRN
  Administered 2022-03-22: 1000 mg via ORAL
  Filled 2022-03-22: qty 2

## 2022-03-22 MED ORDER — SODIUM CHLORIDE 0.9 % IV SOLN: 250.0000 mL | INTRAVENOUS | Status: DC | PRN

## 2022-03-22 MED ORDER — ORAL CARE MOUTH RINSE: 15.0000 mL | Freq: Once | OROMUCOSAL | Status: AC

## 2022-03-22 MED ORDER — ACETAMINOPHEN 500 MG PO TABS
1000.0000 mg | ORAL_TABLET | ORAL | Status: AC
  Administered 2022-03-22: 1000 mg via ORAL
  Filled 2022-03-22: qty 2

## 2022-03-22 SURGICAL SUPPLY — 76 items
APL PRP STRL LF DISP 70% ISPRP (MISCELLANEOUS) ×1
APL SKNCLS STERI-STRIP NONHPOA (GAUZE/BANDAGES/DRESSINGS) ×1
BAG COUNTER SPONGE SURGICOUNT (BAG) IMPLANT
BAG SPNG CNTER NS LX DISP (BAG)
BENZOIN TINCTURE PRP APPL 2/3 (GAUZE/BANDAGES/DRESSINGS) ×1 IMPLANT
BLADE SURG 15 STRL LF DISP TIS (BLADE) IMPLANT
BLADE SURG 15 STRL SS (BLADE)
BLADE SURG SZ10 CARB STEEL (BLADE) ×1 IMPLANT
BRIEF MESH DISP LRG (UNDERPADS AND DIAPERS) ×1 IMPLANT
CELLS DAT CNTRL 66122 CELL SVR (MISCELLANEOUS) ×1 IMPLANT
CHLORAPREP W/TINT 26 (MISCELLANEOUS) ×1 IMPLANT
CNTNR URN SCR LID CUP LEK RST (MISCELLANEOUS) ×1 IMPLANT
CONT SPEC 4OZ STRL OR WHT (MISCELLANEOUS) ×1
COVER MAYO STAND STRL (DRAPES) ×1 IMPLANT
COVER SURGICAL LIGHT HANDLE (MISCELLANEOUS) ×1 IMPLANT
DRAIN CHANNEL 19F RND (DRAIN) IMPLANT
DRAPE LAPAROSCOPIC ABDOMINAL (DRAPES) ×1 IMPLANT
DRAPE LAPAROTOMY T 102X78X121 (DRAPES) ×1 IMPLANT
DRAPE SHEET LG 3/4 BI-LAMINATE (DRAPES) IMPLANT
DRAPE UTILITY XL STRL (DRAPES) ×1 IMPLANT
DRAPE WARM FLUID 44X44 (DRAPES) ×1 IMPLANT
DRSG OPSITE POSTOP 4X10 (GAUZE/BANDAGES/DRESSINGS) IMPLANT
DRSG OPSITE POSTOP 4X6 (GAUZE/BANDAGES/DRESSINGS) IMPLANT
DRSG OPSITE POSTOP 4X8 (GAUZE/BANDAGES/DRESSINGS) IMPLANT
DRSG TEGADERM 2-3/8X2-3/4 SM (GAUZE/BANDAGES/DRESSINGS) ×2 IMPLANT
DRSG TEGADERM 4X4.75 (GAUZE/BANDAGES/DRESSINGS) ×1 IMPLANT
ELECT REM PT RETURN 15FT ADLT (MISCELLANEOUS) ×1 IMPLANT
GAUZE 4X4 16PLY ~~LOC~~+RFID DBL (SPONGE) ×1 IMPLANT
GAUZE PAD ABD 8X10 STRL (GAUZE/BANDAGES/DRESSINGS) IMPLANT
GAUZE SPONGE 4X4 12PLY STRL (GAUZE/BANDAGES/DRESSINGS) ×1 IMPLANT
GLOVE ECLIPSE 8.0 STRL XLNG CF (GLOVE) ×1 IMPLANT
GLOVE INDICATOR 8.0 STRL GRN (GLOVE) ×1 IMPLANT
GOWN STRL REUS W/ TWL XL LVL3 (GOWN DISPOSABLE) ×4 IMPLANT
GOWN STRL REUS W/TWL XL LVL3 (GOWN DISPOSABLE) ×4
HANDLE SUCTION POOLE (INSTRUMENTS) ×1 IMPLANT
KIT BASIN OR (CUSTOM PROCEDURE TRAY) ×1 IMPLANT
KIT TURNOVER KIT A (KITS) IMPLANT
LEGGING LITHOTOMY PAIR STRL (DRAPES) ×1 IMPLANT
LOOP VESSEL MAXI BLUE (MISCELLANEOUS) IMPLANT
NEEDLE HYPO 22GX1.5 SAFETY (NEEDLE) ×1 IMPLANT
PACK BASIC VI WITH GOWN DISP (CUSTOM PROCEDURE TRAY) ×1 IMPLANT
PACK GENERAL/GYN (CUSTOM PROCEDURE TRAY) ×1 IMPLANT
PENCIL SMOKE EVACUATOR (MISCELLANEOUS) IMPLANT
RELOAD PROXIMATE 75MM BLUE (ENDOMECHANICALS) ×2 IMPLANT
RELOAD STAPLE 75 3.8 BLU REG (ENDOMECHANICALS) IMPLANT
RETRACTOR WND ALEXIS 18 MED (MISCELLANEOUS) IMPLANT
RTRCTR WOUND ALEXIS 18CM MED (MISCELLANEOUS) ×1
SHEARS HARMONIC 9CM CVD (BLADE) IMPLANT
SPIKE FLUID TRANSFER (MISCELLANEOUS) ×1 IMPLANT
STAPLER 90 3.5 STAND SLIM (STAPLE) ×1
STAPLER 90 3.5 STD SLIM (STAPLE) IMPLANT
STAPLER PROXIMATE 75MM BLUE (STAPLE) IMPLANT
STAPLER VISISTAT 35W (STAPLE) ×1 IMPLANT
SUCTION POOLE HANDLE (INSTRUMENTS) ×1
SURGILUBE 2OZ TUBE FLIPTOP (MISCELLANEOUS) ×1 IMPLANT
SUT CHROMIC 2 0 SH (SUTURE) ×1 IMPLANT
SUT CHROMIC 3 0 SH 27 (SUTURE) IMPLANT
SUT MNCRL AB 4-0 PS2 18 (SUTURE) ×1 IMPLANT
SUT PDS AB 1 TP1 96 (SUTURE) IMPLANT
SUT SILK 2 0 (SUTURE) ×1
SUT SILK 2 0 SH CR/8 (SUTURE) ×1 IMPLANT
SUT SILK 2-0 18XBRD TIE 12 (SUTURE) ×1 IMPLANT
SUT SILK 3 0 (SUTURE)
SUT SILK 3 0 SH CR/8 (SUTURE) ×1 IMPLANT
SUT SILK 3-0 18XBRD TIE 12 (SUTURE) ×1 IMPLANT
SUT VIC AB 2-0 SH 27 (SUTURE)
SUT VIC AB 2-0 SH 27X BRD (SUTURE) IMPLANT
SUT VIC AB 2-0 UR6 27 (SUTURE) ×6 IMPLANT
SYR 20ML LL LF (SYRINGE) ×1 IMPLANT
SYR 3ML LL SCALE MARK (SYRINGE) IMPLANT
SYR BULB IRRIG 60ML STRL (SYRINGE) ×1 IMPLANT
TAPE UMBILICAL 1/8 X36 TWILL (MISCELLANEOUS) ×1 IMPLANT
TOWEL OR 17X26 10 PK STRL BLUE (TOWEL DISPOSABLE) ×2 IMPLANT
TOWEL OR NON WOVEN STRL DISP B (DISPOSABLE) ×2 IMPLANT
TRAY FOLEY MTR SLVR 16FR STAT (SET/KITS/TRAYS/PACK) IMPLANT
YANKAUER SUCT BULB TIP 10FT TU (MISCELLANEOUS) ×1 IMPLANT

## 2022-03-22 NOTE — Progress Notes (Signed)
Pt having to urinate frequently..   States he feels like he can  not empy his bladder.  Bladder scanner shows 643 ml.  Called Dr. Zenia Resides and orders for in and out cath.  And if pt has problems during the night then place foley catheter.   Did in and out cath using sterile technique and emptied 650 ml of clear yellow urine.

## 2022-03-22 NOTE — Interval H&P Note (Signed)
History and Physical Interval Note:  03/22/2022 10:56 AM  Jesse [name]  has presented today for surgery, with the diagnosis of LOOP ILEOSTOMY IN PLACE FOR FECAL DIVERSION.  The various methods of treatment have been discussed with the patient and family. After consideration of risks, benefits and other options for treatment, the patient has consented to  Procedure(s) with comments: OPEN TAKEDOWN OF LOOP ILEOSTOMY (N/A) - GEN w/ERAS PATHWAY LOCAL ANORECTAL EXAMINATION UNDER ANESTHESIA (N/A) as a surgical intervention.  The patient's history has been reviewed, patient examined, no change in status, stable for surgery.  I have reviewed the patient's chart and labs.  Questions were answered to the patient's satisfaction.    I have re-reviewed the the patient's records, history, medications, and allergies.  I have re-examined the patient.  I again discussed intraoperative plans and goals of post-operative recovery.  The patient agrees to proceed.  Jesse [name]  1951-02-27 [id number]  Patient Care Team: [name], MD as PCP - General (Family Medicine) Curt Bears, MD as Consulting Physician (Oncology) Viona Gilmore, [facility] as Pharmacist (Pharmacist) Michael Boston, MD as Consulting Physician (General Surgery) Gatha Mayer, MD as Consulting Physician (Gastroenterology) Garner Nash, DO as Consulting Physician (Pulmonary Disease) Alexis Frock, MD as Consulting Physician (Urology)  Patient Active Problem List   Diagnosis Date Noted   Irritant contact dermatitis associated with stoma    Ileostomy prolapse ([place])    Ileostomy in place [facility]) 12/15/2021   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[place]) 08/21/2021   GIST (gastrointestinal stromal tumor), malignant ([place]) 07/18/2020   Encounter for antineoplastic chemotherapy 01/28/2020   Adenocarcinoma of left lung, stage 2 ([place]) 12/31/2019   Goals of care, counseling/discussion 12/31/2019   S/P lobectomy  of lung 12/16/2019   Lung nodule 10/13/2019   Essential hypertension 09/28/2016   Coronary artery calcification 09/28/2016   Impaired glucose tolerance 09/28/2016   NEOPLASM, MALIGNANT, BLADDER, HX OF 07/18/2007   Vitamin D deficiency 06/03/2007   DIVERTICULOSIS, COLON 01/14/2007   Osteoarthritis 01/14/2007   Dyslipidemia 01/09/2007   GERD 01/09/2007   REFLUX ESOPHAGITIS 06/01/2004   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    Past Medical History:  Diagnosis Date   Anemia    Cancer ([place])    bladder cancer   COPD (chronic obstructive pulmonary disease) ([place])    mild    DIVERTICULOSIS, COLON 01/14/2007   Qualifier: Diagnosis of  By: Paulina Fusi RN, [name]    GASTRITIS, CHRONIC 06/01/2004   Qualifier: Diagnosis of  By: Nolon Rod CMA ([place]), Robin     GERD 01/09/2007   Qualifier: Diagnosis of  By: Sherren Mocha, RN, Ellen     Heart murmur    as a child; no murmur heard 12/14/19   HYPERLIPIDEMIA 01/09/2007   Qualifier: Diagnosis of  By: Sherren Mocha RN, Dorian Pod     Hypertension    Lung mass    left upper nodule   Macular degeneration    right eye   NEOPLASM, MALIGNANT, BLADDER, HX OF 2002   Qualifier: Diagnosis of  By: Nolon Rod CMA (AAMA), Robin  / no chemo or radiation   OSTEOARTHRITIS 01/14/2007   Qualifier: Diagnosis of  By: Paulina Fusi, RN, [name] - knees   Rectal mass    Reflux esophagitis 06/01/2004   Qualifier: Diagnosis of  By: Nolon Rod CMA (AAMA), Robin     TOBACCO USER 02/16/2009   Qualifier: Diagnosis of  By: Burnice Logan  MD, Doretha Sou    VITAMIN D DEFICIENCY 06/03/2007   Qualifier: Diagnosis of  By: Burnice Logan  MD, Doretha Sou     Past Surgical History:  Procedure Laterality Date   BREAST BIOPSY Left 03/16/2022   [place] LT RADIOACTIVE SEED LOC 03/16/2022 [facility] MAMMOGRAP[facility]   BRONCHIAL BIOPSY  10/13/2019   Procedure: BRONCHIAL BIOPSIES;  Surgeon: Garner Nash, DO;  Location: [place] [facility];  Service: Pulmonary;;   BRONCHIAL BRUSHINGS  10/13/2019   Procedure: BRONCHIAL BRUSHINGS;  Surgeon: Garner Nash, DO;  Location: [place] [facility];  Service: Pulmonary;;   BRONCHIAL NEEDLE ASPIRATION BIOPSY  10/13/2019   Procedure: BRONCHIAL NEEDLE ASPIRATION BIOPSIES;  Surgeon: Garner Nash, DO;  Location: [place] [facility];  Service: Pulmonary;;   BRONCHIAL WASHINGS  10/13/2019   Procedure: BRONCHIAL WASHINGS;  Surgeon: Garner Nash, DO;  Location: [place];  Service: Pulmonary;;   COLONOSCOPY     multiple   DIVERTING ILEOSTOMY N/A 12/14/2021   Procedure: DIVERTING ILEOSTOMY;  Surgeon: Michael Boston, MD;  Location: [facility];  Service: General;  Laterality: N/A;   INTERCOSTAL NERVE BLOCK Left 12/16/2019   Procedure: INTERCOSTAL NERVE BLOCK;  Surgeon: Lajuana Matte, MD;  Location: [place];  Service: Thoracic;  Laterality: Left;   KNEE SURGERY  07/20/2008   bilat.   NASAL SEPTUM SURGERY  1995   NODE DISSECTION Left 12/16/2019   Procedure: NODE DISSECTION;  Surgeon: Lajuana Matte, MD;  Location: [place];  Service: Thoracic;  Laterality: Left;   RADIOACTIVE SEED GUIDED AXILLARY SENTINEL LYMPH NODE Left 03/20/2022   Procedure: RADIOACTIVE SEED GUIDED LEFT AXILLARY SENTINEL LYMPH NODE BIOPSY;  Surgeon: Erroll Luna, MD;  Location: [place];  Service: General;  Laterality: Left;   TONSILLECTOMY     TRANSURETHRAL RESECTION OF BLADDER TUMOR  2002   UPPER GASTROINTESTINAL ENDOSCOPY     VIDEO BRONCHOSCOPY WITH ENDOBRONCHIAL NAVIGATION N/A 10/13/2019   Procedure: VIDEO BRONCHOSCOPY WITH ENDOBRONCHIAL NAVIGATION;  Surgeon: Garner Nash, DO;  Location: [place];  Service: Pulmonary;  Laterality: N/A;   WISDOM TOOTH EXTRACTION     XI ROBOTIC ASSISTED LOWER ANTERIOR RESECTION N/A 12/14/2021   Procedure: XI ROBOTIC ASSISTED LOWER ANTERIOR ULTRA LOW RECTOSIGMOID RESECTION, COLOANAL HAND SEWN ANASTOMOSIS, AND BILATERAL TAP BLOCK;  Surgeon: Michael Boston, MD;  Location[facility];  Service: General;  Laterality: N/A;    Social History   Socioeconomic History   Marital status: Married    Spouse name:  Not on file   Number of children: 3   Years of education: Not on file   Highest education level: Not on file  Occupational History    Comment: semi retired   Tobacco Use   Smoking status: Former    Packs/day: 0.50    Years: 40.00    Total pack years: 20.00    Types: Cigarettes    Quit date: 09/21/2019    Years since quitting: 2.5   Smokeless tobacco: Never  Vaping Use   Vaping Use: Never used  Substance and Sexual Activity   Alcohol use: Yes    Alcohol/week: 15.0 standard drinks of alcohol    Types: 15 Standard drinks or equivalent per week    Comment: wine/beer/martini   Drug use: Not Currently    Types: Marijuana    Comment: Smokes marijuana occasional, last use in 08/2019   Sexual activity: Not Currently  Other Topics Concern   Not on file  Social History Narrative   Married   3 children: 3 grandchildren; 3 great grandchildren   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   Enjoys yard work and  golf   Social Determinants of Health   Financial Resource Strain: Low Risk  (07/18/2021)   Overall Financial Resource Strain (CARDIA)    Difficulty of Paying Living Expenses: Not hard at all  Food Insecurity: No Food Insecurity (01/12/2022)   Hunger Vital Sign    Worried About Running Out of Food in the Last Year: Never true    Ran Out of Food in the Last Year: Never true  Transportation Needs: No Transportation Needs (01/12/2022)   PRAPARE - Hydrologist (Medical): No    Lack of Transportation (Non-Medical): No  Physical Activity: Sufficiently Active (07/18/2021)   Exercise Vital Sign    Days of Exercise per Week: 7 days    Minutes of Exercise per Session: 60 min  Stress: No Stress Concern Present (07/18/2021)   [place]    Feeling of Stress : Not at all  Social Connections: Moderately Isolated (07/12/2020)   Social Connection and Isolation Panel [NHANES]    Frequency of  Communication with Friends and Family: More than three times a week    Frequency of Social Gatherings with Friends and Family: More than three times a week    Attends Religious Services: Never    Marine scientist or Organizations: No    Attends Archivist Meetings: Never    Marital Status: Married  Human resources officer Violence: Not At Risk (07/12/2020)   Humiliation, Afraid, Rape, and Kick questionnaire    Fear of Current or Ex-Partner: No    Emotionally Abused: No    Physically Abused: No    Sexually Abused: No    Family History  Problem Relation Age of Onset   Dementia Mother    Diabetes Father    Mental illness Father    Peripheral vascular disease Brother     Medications Prior to Admission  Medication Sig Dispense Refill Last Dose   acetaminophen (TYLENOL) 500 MG tablet Take 1,000 mg by mouth every 8 (eight) hours as needed for moderate pain.   Past Week   amLODipine (NORVASC) 5 MG tablet Take 1 tablet (5 mg total) by mouth daily. 90 tablet 3 03/22/2022 at 0600   atorvastatin (LIPITOR) 40 MG tablet Take 1/2 tablet (20 mg total) by mouth daily. 90 tablet 3 03/21/2022 at 0600   Cholecalciferol (VITAMIN D3 PO) Take 1 capsule by mouth daily.   Past Week   imatinib (GLEEVEC) 400 MG tablet Take 1 tablet (400 mg total) by mouth daily. Take with meals and large glass of water.Caution:Chemotherapy. 30 tablet 3 03/21/2022 at 2000   Multiple Vitamins-Minerals (PRESERVISION AREDS 2) CAPS Take 1 capsule by mouth 2 (two) times daily.   Past Week   omeprazole (PRILOSEC) 40 MG capsule Take 1 capsule (40 mg total) by mouth daily. 90 capsule 3 03/22/2022 at [place] Take 3 tablets by mouth daily.  Balance of Nature Fruits   Past Week   OVER THE COUNTER MEDICATION Take 3 tablets by mouth daily. Balance of Nature Vegetable   Past Week   UNABLE TO FIND Take by mouth daily. Med Name: Balance of Petra Kuba   Past Month   oxyCODONE (OXY IR/ROXICODONE) 5 MG immediate  release tablet Take 1 tablet (5 mg total) by mouth every 6 (six) hours as needed for severe pain. 15 tablet 0 Unknown   traMADol (ULTRAM) 50 MG tablet Take 1-2 tablets (50-100 mg total) by mouth every 6 (six)  hours as needed for moderate pain. (Patient not taking: Reported on 02/05/2022) 20 tablet 0     Current Facility-Administered Medications  Medication Dose Route Frequency Provider Last Rate Last Admin   bupivacaine liposome (EXPAREL) 1.3 % injection 266 mg  20 mL Infiltration Once Michael Boston, MD       cefoTEtan (CEFOTAN) 2 g in sodium chloride 0.9 % 100 mL IVPB  2 g Intravenous On Call to OR Michael Boston, MD       Chlorhexidine Gluconate Cloth 2 % PADS 6 each  6 each Topical Once Michael Boston, MD       And   Chlorhexidine Gluconate Cloth 2 % PADS 6 each  6 each Topical Once Michael Boston, MD       lactated ringers infusion   Intravenous Continuous Effie Berkshire, MD 10 mL/hr at 03/22/22 0927 New Bag at 03/22/22 4081   lactated ringers infusion   Intravenous Continuous Roderic Palau, MD 10 mL/hr at 03/22/22 1048 New Bag at 03/22/22 1048     No Known Allergies  BP 133/81   Pulse 74   Temp 98.3 F (36.8 C) (Oral)   Resp 18   Ht _0  (1.753 m)   Wt 70.3 kg   SpO2 100%   BMI 22.89 kg/m   Labs: No results found for this or any previous visit (from the past 48 hour(s)).  Imaging / Studies: MM Breast Surgical Specimen  Result Date: 03/20/2022 CLINICAL DATA:  Specimen radiograph status post left axillary lymph node excision. EXAM: SPECIMEN RADIOGRAPH OF THE LEFT BREAST COMPARISON:  Previous exam(s). FINDINGS: Status post excision of the left axilla. The radioactive seed is seen within its own specimen container, separate from a second specimen container with the tissue specimen. This was communicated with the OR at 11:55 a.m. IMPRESSION: Specimen radiograph of the left axilla. Electronically Signed   By: Ammie Ferrier M.D.   On: 03/20/2022 11:57  [place] LT RADIOACTIVE  SEED LOC  Result Date: 03/16/2022 CLINICAL DATA:  71 year old male presenting for seed localization of a left axillary lymph node. Patient has indeterminate left axillary adenopathy. EXAM: ULTRASOUND GUIDED RADIOACTIVE SEED LOCALIZATION OF THE LEFT AXILLA COMPARISON:  Previous exam(s). FINDINGS: Patient presents for radioactive seed localization prior to left axillary dissection. I met with the patient and we discussed the procedure of seed localization including benefits and alternatives. We discussed the high likelihood of a successful procedure. We discussed the risks of the procedure including infection, bleeding, tissue injury and further surgery. We discussed the low dose of radioactivity involved in the procedure. Informed, written consent was given. The usual time-out protocol was performed immediately prior to the procedure. Using ultrasound guidance, sterile technique, 1% lidocaine and an I-125 radioactive seed, and enlarged left axillary lymph node was localized using a lateral approach. Follow-up survey of the patient confirms presence of the radioactive seed. Order number of I-125 seed:  [id number]. Total activity: 0.259 mCi reference Date: February 01, 2022 The patient tolerated the procedure well and was released from the [facility]. She was given instructions regarding seed removal. IMPRESSION: Radioactive seed localization left axilla. No apparent complications. Electronically Signed   By: Audie Pinto M.D.   On: 03/16/2022 15:14  [place] CORE BIOPSY (LYMPH NODES)  Result Date: 02/20/2022 INDICATION: 71 year old male with a history of lung cancer now with FDG avid left axillary lymphadenopathy. EXAM: ULTRASOUND BIOPSY OF THE LYMPH NODES MEDICATIONS: None. ANESTHESIA/SEDATION: None. FLUOROSCOPY TIME:  None. COMPLICATIONS: None immediate. PROCEDURE: Informed written consent  was obtained from the patient after a thorough discussion of the procedural risks, benefits and alternatives. All  questions were addressed. Maximal Sterile Barrier Technique was utilized including caps, mask, sterile gowns, sterile gloves, sterile drape, hand hygiene and skin antiseptic. A timeout was performed prior to the initiation of the procedure. Ultrasound was used to interrogate the left axillary space. The abnormal lymph node was successfully identified. A suitable skin entry site was selected and marked. The region was sterilely prepped and draped in the standard fashion using chlorhexidine skin prep. Local anesthesia was attained by infiltration with 1% lidocaine. A small dermatotomy was made. Under real-time ultrasound guidance, multiple 18 gauge core biopsies were obtained using the Bard mission automated biopsy device. Biopsy specimens were placed in saline and delivered to pathology for further analysis. Post biopsy imaging demonstrates no evidence of complication. IMPRESSION: Technically successful ultrasound-guided core biopsy of FDG avid left axillary lymph node. Electronically Signed   By: Jacqulynn Cadet M.D.   On: 02/20/2022 15:28     .Adin Hector, M.D., F.A.C.S. Gastrointestinal and Minimally Invasive Surgery [facility] [address], [place], [place] [address] [phone] Main / Paging  03/22/2022 10:56 AM    Adin Hector

## 2022-03-22 NOTE — Anesthesia Preprocedure Evaluation (Addendum)
Anesthesia Evaluation  Patient identified by MRN, date of birth, ID band Patient awake    Reviewed: Allergy & Precautions, H&P , NPO status , Patient's Chart, lab work & pertinent test results  Airway Mallampati: II  TM Distance: >3 FB Neck ROM: Full    Dental no notable dental hx. (+) Teeth Intact, Dental Advisory Given   Pulmonary COPD, former smoker   Pulmonary exam normal breath sounds clear to auscultation       Cardiovascular hypertension, Pt. on medications  Rhythm:Regular Rate:Normal     Neuro/Psych negative neurological ROS  negative psych ROS   GI/Hepatic Neg liver ROS,GERD  Medicated,,  Endo/Other  negative endocrine ROS    Renal/GU negative Renal ROS  negative genitourinary   Musculoskeletal  (+) Arthritis , Osteoarthritis,    Abdominal   Peds  Hematology  (+) Blood dyscrasia, anemia   Anesthesia Other Findings   Reproductive/Obstetrics negative OB ROS                             Anesthesia Physical Anesthesia Plan  ASA: 2  Anesthesia Plan: General   Post-op Pain Management: Tylenol PO (pre-op)*   Induction: Intravenous  PONV Risk Score and Plan: 3 and Ondansetron, Dexamethasone and Midazolam  Airway Management Planned: Oral ETT  Additional Equipment:   Intra-op Plan:   Post-operative Plan: Extubation in OR  Informed Consent: I have reviewed the patients History and Physical, chart, labs and discussed the procedure including the risks, benefits and alternatives for the proposed anesthesia with the patient or authorized representative who has indicated his/her understanding and acceptance.     Dental advisory given  Plan Discussed with: CRNA  Anesthesia Plan Comments:        Anesthesia Quick Evaluation

## 2022-03-22 NOTE — Progress Notes (Signed)
Prior-To-Admission Oral Chemotherapy for Treatment of Oncologic Disease   Order noted from Dr. Johney Maine to continue prior-to-admission oral chemotherapy regimen of imatinib.  Procedure Per Pharmacy & Therapeutics Committee Policy: Orders for continuation of home oral chemotherapy for treatment of an oncologic disease will be held unless approved by an oncologist during current admission.    For patients receiving oncology care at [facility], inpatient pharmacist contacts patient's oncologist during regular office hours to review. If earlier review is medically necessary, attending physician consults [facility] on-call oncologist   For patients receiving oncology care outside of [facility], attending physician consults patient's oncologist to review. If this oncologist or their coverage cannot be reached, attending physician consults [facility] on-call oncologist   Oral chemotherapy continuation order is on hold pending oncologist review, [facility] oncologist Dr. Julien Nordmann will be notified by inpatient pharmacy during office hours    Peggyann Juba, PharmD, BCPS 03/22/2022, 6:21 PM

## 2022-03-22 NOTE — Discharge Instructions (Addendum)

## 2022-03-22 NOTE — Transfer of Care (Signed)
Immediate Anesthesia Transfer of Care Note  Patient: Jesse [name]  Procedure(s) Performed: OPEN TAKEDOWN OF LOOP ILEOSTOMY ANORECTAL EXAMINATION UNDER ANESTHESIA  Patient Location: PACU  Anesthesia Type:General  Level of Consciousness: drowsy  Airway & Oxygen Therapy: Patient Spontanous Breathing and Patient connected to face mask oxygen  Post-op Assessment: Report given to RN and Post -op Vital signs reviewed and stable  Post vital signs: Reviewed and stable  Last Vitals:  Vitals Value Taken Time  BP    Temp    Pulse    Resp    SpO2      Last Pain:  Vitals:   03/22/22 0930  TempSrc: Oral  PainSc:       Patients Stated Pain Goal: 5 (23/34/35 6861)  Complications: No notable events documented.

## 2022-03-22 NOTE — H&P (Signed)
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 First dose started July 18, 2020.  undergoing robotic ultralow anterior resection with coloanal handsewn anastomosis and diverting loop ileostomy 12/14/2021.  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 Small residual focus. Margins negative. Liver carpeted with small nodules. 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 He is over 2 months out. His bowels have slowed down out his ileostomy and is weaning himself off Imodium, emptying his back about 5-6 times a day. He did have some persistent unintentional weight loss. He is also just getting over lung cancer treatment as well. Dr. Earlie Server, his medical oncologist, put him on steroids. His appetite improved and he has gained some weight back. Energy level is good. His wife agrees he is doing okay. No fevers or chills.  Anal pain is gone away. He has minimal episodes of drainage. No bleeding. No fevers or chills. could be removed. No fevers or chills. He is open to drink some alcohol. Making good urine output.  We discussed his case with GI tumor board. Recommend continue his [place] for a total of 3 years since starting. He is over a year into treatment neoadjuvantly. He is due to switch pharmacies to something more mail order.    Labs, Imaging and Diagnostic Testing:  [facility] NOTES:  SURGERY  NOTES:  12/14/2021  POST-OPERATIVE DIAGNOSIS: GIST TUMOR OF RECTUM LIVER NODULARITY  PROCEDURE: -XI ROBOTIC ASSISTED VERY LOW ANTERIOR RECTOSIGMOID RESECTION -COLOANAL HANDSEWN ANASTOMOSIS -DIVERTING LOOP ILEOSTOMY -WEDGE LIVER BIOPSY -INTRAOPERATIVE ASSESSMENT OF TISSUE VASCULAR PERFUSION USING ICG (indocyanine green) IMMUNOFLUORESCENCE -TRANSVERSUS ABDOMINIS PLANE (TAP) BLOCK - BILATERAL  SURGEON: Adin Hector, MD  OR FINDINGS:  Patient had ellipsoid mass in densely adhered incorporated to the right anterior distal rectal wall with distal margin less than 2 cm from the anal verge. Chicken egg size much smaller than initial presentation of mango size.  Liver rather scalloped and purple concerning for possible cirrhosis. Covered with miliary white superficial patches 2-5 mm in size that seem very soft and superficial. Not classic for metastatic adenocarcinoma. Wedge liver biopsy done.  It is then an sigmoid to and anal canal handsewn anastomosis less than a centimeter from the anal verge. He has a diverting loop ileostomy to help protect the anastomosis.  PATHOLOGY:  SURGICAL PATHOLOGY CASE: [id number] PATIENT: Lattie Corns Surgical Pathology Report  Clinical History: GIST tumor of rectum (crm)  FINAL MICROSCOPIC DIAGNOSIS:  A. RECTOSIGMOID COLON, DISTAL RECTAL MASS, RESECTION: Minimal residual gastrointestinal stromal tumor Residual tumor measures 0.4 mm (ypT1) Tumor bed measures 4.6 x 4.1 x 3.5 cm Margins free of tumor Eleven benign pericolonic lymph nodes (0/11, ypN0)  B. LIVER, WEDGE EXCISION: Benign bile duct hamartomas  ONCOLOGY TABLE:  GASTROINTESTINAL STROMAL TUMOR (GIST): Resection  Procedure: Low anterior resection Tumor Focality: Unifocal Multiple Primary Sites: Not applicable Tumor Site: Right lateral distal rectum Tumor Size: Residual tumor measures 0.4 mm (see comment) Histologic Type: Gastrointestinal stromal tumor, spindle cell  type Mitotic Rate:  0 Histologic Grade: G1 Treatment Effect: Present and extensive Risk Assessment: Low Margins: All margins negative for tumor is true margin Closest Margin(s) to GIST: Right lateral perirectal soft tissue margin Distance from Closest Margin (mm or cm): Tumor bed 0.5 mm from margin Regional Lymph Nodes: Number of Lymph Nodes with Tumor: 0 Number of Lymph Nodes Examined: 11 Distant Metastasis: Not applicable Pathologic Stage Classification (pTNM, AJCC 8th Edition: ypT1, ypN0 Ancillary Studies: Available upon request] Representative Tumor Block: A9 Comment(s): The intramural mass described grossly is comprised predominantly of homogenized sparsely cellular fibrotic stroma containing scattered vessels bland spindle cells and inflammatory cells. Very focally within 1 section there is a perinodular lobulated fragment of residual tumor composed of atypical spindled cells measuring 0.4 cm in greatest dimension. No mitotic activity is seen. A single immunohistochemical stain for CD117 is performed with adequate control. The spindle cells are positive for CD117 supporting the diagnosis of minimal residual gastrointestinal stromal tumor.  (v4.2.0.0)  Michie Molnar DESCRIPTION:  Specimen A: Colon resection, clinically rectosigmoid with resection of distal rectal mass, received fresh. Specimen integrity: Intact and unopened. Orientation: Suture at distal margin. Specimen length: 28 cm (13 cm of sigmoid, 15 cm of rectum to include at least portion of distal third of rectum) Mesorectal intactness: Incomplete in area of distal rectal mass Tumor location: Distal rectum, right lateral, intramural. Tumor size: 4.6 x 4.1 x 3.5 cm well-defined firm intramural mass which has a pale yellow-gray solid, myxoid cut surface with a central 1.6 cm in diameter cavitary area containing pale yellow soft material. Percent of bowel circumference involved: Not applicable Tumor distance to  margins: Proximal: 21 cm Distal: 2 cm Mesenteric (sigmoid and transverse): 10 cm Radial: The mass abuts the right lateral perirectal soft tissue margin. Macroscopic extent of tumor invasion: No Elleanna Melling involvement of overlying mucosa. Total presumed lymph nodes: On initial search are found 5 possible lymph nodes which range from 0.1 to 0.4 cm. The fat is placed in clearing solution and will be reexamined for additional nodes. Extramural satellite tumor nodules: None Mucosal polyp(s): None Additional findings: There are several unperforated diverticula throughout the sigmoid. Block summary: Block 1 = proximal margin Blocks 2, 3 = distal margin Blocks 4-6 = mass abutting right lateral perirectal soft tissue margin Blocks 7, 8 = mass with overlying mucosa Block 9 = central section of mass including cavitary area Block 10 = diverticulum Block 11 = 5 possible lymph nodes found on initial search SW 12/15/2021  On 12/18/21, the specimen is revisited to discover _0  additional possible lymph nodes that are submitted as follows: A12: six whole nodes A13: six whole nodes A14: eight whole nodes (LEF)  Specimen B: Received fresh is a 1.9 x 1.6 x 1.1 cm wedge of indurated tissue with light brown cut surfaces. Sectioned and entirely submitted in 2 blocks.  SW 12/15/2021  Final Diagnosis performed by Tobin Chad, MD. Electronically signed 12/21/2021 Technical component performed at [facility], [place] [address], [place], [place] [address]. Professional component performed at [employer]. [facility], [place] [address], [place], [place] [address]. Immunohistochemistry Technical component (if applicable) was performed at [facility]. [address] [place], [place], [place] [address]. IMMUNOHISTOCHEMISTRY DISCLAIMER (if applicable): Some of these immunohistochemical stains may have been developed and the performance characteristics determine by  [facility]. Some may not have been cleared or approved by the U.S. Food and Drug Administration. The FDA has determined that such clearance or approval is not necessary. This  test is used for clinical purposes. It should not be regarded as investigational or for research. This laboratory is certified under the [place] (CLIA-88) as qualified to perform high complexity clinical laboratory testing. The controls stained appropriately.  Physical Examination:  There is no height or weight on file to calculate BMI.  Constitutional: Not cachectic. Hygeine adequate. Moves around without any splinting or guarding. Eyes: Normal extraocular movements. Sclera nonicteric Neuro: No major focal sensory defects. No major motor deficits. Psych: No severe agitation. No severe anxiety. Judgment & insight Adequate, Oriented x4, HENT: Normocephalic, Mucus membranes moist. No thrush. Neck: Supple, No tracheal deviation. Chest: Good respiratory excursion. No audible wheezing CV: No major extremity edema Ext: No obvious deformity or contracture. Edema: not present. No cyanosis Skin: Warm and dry Musculoskeletal: Severe joint rigidity not present. Mobility: no assist device moving easily without restrictions  Abdomen: Flat Hernia: Not present. Incisions Clean & dry with normal healing ridge Nontender. Diastasis recti: Not present. Soft. Nondistended. Old dressing removed. Ileostomy pink with oatmeal consistency effluent in bag.  Gen: Inguinal hernia: Not present. Inguinal lymph nodes: without lymphadenopathy.  Rectal: Anal skin clear. No pruritus. Normal sphincter tone. I can palpate a narrow but smooth thin circular anastomosis at the coloanal margin 15-20 mm from the anal verge. Smooth with no leak or irritation. Cannot tolerate index finger No external hemorrhoids. I held off on internal exam.    Assessment and Plan:  Jesse [name] is a 71 y.o.  male recovering s/p robotic ultralow anterior resection with handsewn coloanal anastomosis and diverting loop ileostomy 12/14/2021.Marland Kitchen  There are no diagnoses linked to this encounter.   Considering everything, he is doing relatively well.  Nearly back to regular activity level. He can transition to unrestricted activity. Wine in moderation is reasonable. Go back to doing some short game golfing gradually transition to unrestricted by mid October.  ileostomy takedown .  The anatomy & physiology of the digestive tract was discussed. The pathophysiology was discussed. Possibility of remaining with an ostomy permanently was discussed. I offered ostomy takedown. Laparoscopic & open techniques were discussed.  Risks such as bleeding, infection, abscess, leak, reoperation, possible re-ostomy, injury to other organs, need for repair of tissues / organs, need for further treatment, hernia, heart attack, death, and other risks were discussed. I noted a good likelihood this will help address the problem. Goals of post-operative recovery were discussed as well. We will work to minimize complications. Questions were answered. The patient expresses understanding & wishes to proceed with surgery.  He had a pretty good tumor response with less than a centimeter of active GIST tumor. We discussed at GI tumor board. Recommendation to complete 3 years of Gleevec, continuing through post adjuvant treatment until he is used all 3 years. = April 2025. Defer prescriptions of Gleevec to Dr. Earlie Server and the [facility] cancer center.   Adin Hector, MD, FACS, MASCRS Esophageal, Gastrointestinal & Colorectal Surgery Robotic and Minimally Invasive Surgery  [facility] [address], [place], [place] [address] [phone] Fax [phone] Main  CONTACT INFORMATION:  Weekday (9AM-5PM): Call [facility] main office at [phone]  Weeknight (5PM-9AM) or  Weekend/Holiday: Check [email or web address] (password " [email or web address]") for General Surgery [facility] coverage  (Please, do not use SecureChat as it is not reliable communication to reach operating surgeons for immediate patient care given surgeries/outpatient duties/clinic/cross-coverage/off post-call which would lead to a delay in care.  Epic staff  messaging available for outptient concerns, but may not be answered for 48 hours or more).

## 2022-03-22 NOTE — Anesthesia Procedure Notes (Signed)
Procedure Name: Intubation Date/Time: 03/22/2022 11:56 AM  Performed by: Sharlette Dense, CRNAPre-anesthesia Checklist: Patient identified, Emergency Drugs available, Suction available and Patient being monitored Patient Re-evaluated:Patient Re-evaluated prior to induction Oxygen Delivery Method: Circle system utilized Preoxygenation: Pre-oxygenation with 100% oxygen Induction Type: IV induction Ventilation: Mask ventilation without difficulty and Oral airway inserted - appropriate to patient size Laryngoscope Size: Miller and 3 Grade View: Grade I Tube type: Oral Tube size: 8.0 mm Number of attempts: 1 Airway Equipment and Method: Stylet Placement Confirmation: ETT inserted through vocal cords under direct vision, positive ETCO2 and breath sounds checked- equal and bilateral Secured at: 24 cm Tube secured with: Tape Dental Injury: Teeth and Oropharynx as per pre-operative assessment

## 2022-03-22 NOTE — Op Note (Addendum)
03/22/2022  1:25 PM  PATIENT:  Jesse [name]  71 y.o. male  Patient Care Team: [name], MD as PCP - General (Family Medicine) Curt Bears, MD as Consulting Physician (Oncology) Viona Gilmore, [facility] as Pharmacist (Pharmacist) Michael Boston, MD as Consulting Physician (General Surgery) Gatha Mayer, MD as Consulting Physician (Gastroenterology) Garner Nash, DO as Consulting Physician (Pulmonary Disease) Alexis Frock, MD as Consulting Physician (Urology)  PRE-OPERATIVE DIAGNOSIS:  LOOP ILEOSTOMY IN PLACE FOR FECAL DIVERSION  POST-OPERATIVE DIAGNOSIS:  LOOP ILEOSTOMY IN PLACE FOR FECAL DIVERSION  PROCEDURE:   OPEN TAKEDOWN OF LOOP ILEOSTOMY ANORECTAL EXAMINATION UNDER ANESTHESIA  SURGEON:  Adin Hector, MD  ASSIST: Sheldon Silvan, MD, PGY-5, [employer]   ANESTHESIA:   local and general  EBL:  Total I/O In: -  Out: 74 [Urine:50; Blood:25]  Delay start of Pharmacological VTE agent (>24hrs) due to surgical blood loss or risk of bleeding:  no  DRAINS: none   SPECIMEN:  Source of Specimen:  Ileostomy  DISPOSITION OF SPECIMEN:  PATHOLOGY  COUNTS:  YES  PLAN OF CARE: Admit to inpatient   PATIENT DISPOSITION:   PACU - hemodynamically stable.  INDICATION:  Patient requiring diverting loop ileostomy.  The patient has recovered to the point not requiring fecal diversion.  Requested ostomy takedown.  I felt was appropriate this time. I recommended takedown of ileostomy and examination under anesthesia:   The anatomy & physiology of the digestive tract was discussed. The pathophysiology was discussed. Possibility of remaining with an ostomy permanently was discussed. I offered ostomy takedown. Laparoscopic & open techniques were discussed.   Risks such as bleeding, infection, abscess, leak, reoperation, possible re-ostomy, injury to other organs, hernia, heart attack, death, and other risks were discussed. I noted a good likelihood this will  help address the problem. Goals of post-operative recovery were discussed as well. We will work to minimize complications. Questions were answered. The patient expresses understanding & wishes to proceed with surgery.   OR FINDINGS:   Low coloanal anastomosis within a centimeter of the anal verge.  Tolerates index finger = 15 x 20 mm.  Intact sphincter.    Gently dilated.  Anastomosis well healed.   Moderate adhesions of loop ileostomy to abdominal wall.  No parastomal hernia.   It is a side-to-side stapled ileoileal anastomosis close to the terminal ileum.   CASE DATA:  Type of patient?: Elective [facility] Private Case  Status of Case? Elective Scheduled  Infection Present At Time Of Surgery (PATOS)?  NO  DESCRIPTION:   Informed consent was confirmed. The patient received IV antibiotics and underwent general anesthesia without any difficulty. The patient was positioned in low litholtomy. Foley catheter was sterilely placed. SCDs were active during the entire case. The abdomen was cprepped and draped in a sterile fashion. A surgical timeout confirmed our plan.   I performed examination under anesthesia of the perineum. I can palpate the coloanal very low anastomosis in the neorectum.  Findings as above.  Mild dilation done with a finger.  No evidence of fistula leak or irritation. Sphincter intact.   I changed gown/gloves. Surgical timeout confirmed for ileostomy takedown. I proceeded to make a circular incision around the loop ileostomy. The loop ileostomy was freed from adhesions to the subcutaneous and fascial layers of the abdominal wall using focused cautery and sharp dissection.  We were able to breech safely into the peritoneum. No adhesions felt. We could eviscerate the small bowel just proximal/distal to the loop  ileostomy.   We did a side-to-side stapled anastomosis using a GIA-75 stapler.  We then used a TLX 90 to staple off the common defect.   I used interrupted silk stitches to close  the mesenteric defect transversely and do mesenteo-pexy over the TX staple line.  Another silk suture placed at the crotch of proximal end of the anastomosis for antitension.  Hemostasis was excellent. Ileostomy returned into the peritoneal cavity.  We changed gloves.  We assured hemostasis and the former ostomy wound.  Wound irrigated.  I closed the posterior rectus fascia with 0 Vicryl suture.  Anterior rectus fascia was closed using #1 PDS in a running fashion transversely. Skin edges trimmed to remove excess scarring and have healthy edges.   The circular wound was closed using pursestring sutures at Scarpa's fascia of Vicryl and Monocryl in the subcuticular region.  Umbilical tape was used as a wick into the open part of the ostomy wound..  Sterile dressing placed.   Patient extubated and in recovery room in stable condition.  I discussed postoperative plans for recovery with the patient in the holding area.  Instructions are written.  I discussed operative findings, updated the patient's status, discussed probable steps to recovery, and gave postoperative recommendations to the patient's spouse, [name],   .  Recommendations were made.  Questions were answered.  She expressed understanding & appreciation.  Adin Hector, MD, FACS, MASCRS Esophageal, Gastrointestinal & Colorectal Surgery Robotic and Minimally Invasive Surgery  [facility] [place]  [place]. [address], [place], [place] [address] [phone] Fax [phone] Main

## 2022-03-23 ENCOUNTER — Encounter (HOSPITAL_COMMUNITY): Payer: Self-pay | Admitting: Surgery

## 2022-03-23 ENCOUNTER — Other Ambulatory Visit (HOSPITAL_COMMUNITY): Payer: Self-pay

## 2022-03-23 LAB — BASIC METABOLIC PANEL
Anion gap: 9 (ref 5–15)
BUN: 16 mg/dL (ref 8–23)
CO2: 25 mmol/L (ref 22–32)
Calcium: 8.6 mg/dL — ABNORMAL LOW (ref 8.9–10.3)
Chloride: 98 mmol/L (ref 98–111)
Creatinine, Ser: 0.73 mg/dL (ref 0.61–1.24)
GFR, Estimated: >60 mL/min (ref >60)
Glucose, Bld: 121 mg/dL — ABNORMAL HIGH (ref 70–99)
Potassium: 3.1 mmol/L — ABNORMAL LOW (ref 3.5–5.1)
Sodium: 132 mmol/L — ABNORMAL LOW (ref 135–145)

## 2022-03-23 LAB — CBC
HCT: 26.4 % — ABNORMAL LOW (ref 39.0–52.0)
Hemoglobin: 9 g/dL — ABNORMAL LOW (ref 13.0–17.0)
MCH: 32.8 pg (ref 26.0–34.0)
MCHC: 34.1 g/dL (ref 30.0–36.0)
MCV: 96.4 fL (ref 80.0–100.0)
Platelets: 136 10*3/uL — ABNORMAL LOW (ref 150–400)
RBC: 2.74 MIL/uL — ABNORMAL LOW (ref 4.22–5.81)
RDW: 15.1 % (ref 11.5–15.5)
WBC: 8.6 10*3/uL (ref 4.0–10.5)
nRBC: 0 % (ref 0.0–0.2)

## 2022-03-23 LAB — MAGNESIUM: Magnesium: 1.4 mg/dL — ABNORMAL LOW (ref 1.7–2.4)

## 2022-03-23 LAB — SURGICAL PATHOLOGY

## 2022-03-23 MED ORDER — IMATINIB MESYLATE 100 MG PO TABS
400.0000 mg | ORAL_TABLET | Freq: Every day | ORAL | Status: DC
  Administered 2022-03-23 – 2022-03-25 (×3): 400 mg via ORAL
  Filled 2022-03-23 (×3): qty 4

## 2022-03-23 NOTE — Progress Notes (Signed)
Mobility Specialist Cancellation/Refusal Note:  Pt refused mobility twice today. Will check back as schedule permits.    [facility]

## 2022-03-23 NOTE — Anesthesia Postprocedure Evaluation (Signed)
Anesthesia Post Note  Patient: Jesse [name]  Procedure(s) Performed: OPEN TAKEDOWN OF LOOP ILEOSTOMY ANORECTAL EXAMINATION UNDER ANESTHESIA     Patient location during evaluation: Other Anesthesia Type: General Level of consciousness: awake and alert Pain management: pain level controlled Vital Signs Assessment: post-procedure vital signs reviewed and stable Respiratory status: spontaneous breathing, nonlabored ventilation and respiratory function stable Cardiovascular status: blood pressure returned to baseline and stable Postop Assessment: no apparent nausea or vomiting Anesthetic complications: no  No notable events documented.  Last Vitals:  Vitals:   03/23/22 0514 03/23/22 1001  BP: (!) 150/80 (!) 149/77  Pulse: 84 87  Resp: 17 18  Temp: 37 C 37.2 C  SpO2: 98% 99%    Last Pain:  Vitals:   03/23/22 1001  TempSrc: Oral  PainSc:                  Khyrie Masi,W. EDMOND

## 2022-03-23 NOTE — Progress Notes (Signed)
Jesse [name] [id number] [birth year]  CARE TEAM:  PCP: [name], MD  Outpatient Care Team: Patient Care Team: [name], MD as PCP - General (Family Medicine) Curt Bears, MD as Consulting Physician (Oncology) Viona Gilmore, [facility] as Pharmacist (Pharmacist) Michael Boston, MD as Consulting Physician (General Surgery) Gatha Mayer, MD as Consulting Physician (Gastroenterology) Garner Nash, DO as Consulting Physician (Pulmonary Disease) Alexis Frock, MD as Consulting Physician (Urology)  Inpatient Treatment Team: Treatment Team: Attending Provider: Michael Boston, MD; Technician: Leda Quail, NT   Problem List:   Principal Problem:   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 Stay = 1 days)  Plan:  -ERAS protocol  Advance diet gradually.  Fiber bowel regimen  Keep dressing on for 72 hours and then remove postop day 3 along with the wicks per protocol  Some dysuria and hematuria most likely due to I&O cath.  Seems to be self resolving.  No evidence of any recurrent urinary tension  Follow bowel function.  He has a rather low coloanal anastomosis so he may struggle with some leaking incontinence or urgency.  We will see.  Okay to resume Gleevec from my standpoint.  I have discussed with Dr. Earlie Server in medical oncologist.  We do this regularly.  Pharmacy is blocking it.  Let the patient restart it or he can start it when he gets out.  Patient is anxious to stay on it.  -VTE prophylaxis- SCDs, etc  -mobilize as tolerated to help  recovery  Disposition:  Disposition:  The patient is from: Home  Anticipate discharge to:  Home  Anticipated Date of Discharge is:  December 10,2023    Barriers to discharge:  Pending Clinical improvement (more likely than not)  Patient currently is NOT MEDICALLY STABLE for discharge from the hospital from a surgery standpoint.      I reviewed nursing notes, last 24 h vitals and pain scores, last 48 h intake and output, last 24 h labs and trends, and last 24 h imaging results. I have reviewed this patient's available data, including medical history, events of note, test results, etc as part of my evaluation.  A significant portion of that time was spent in counseling.  Care during the described time interval was provided by me.  This care required moderate level of medical decision making.  03/23/2022    Subjective: (Chief complaint)  Patient had some difficulty urinating.  I&O cath.  Had some hematuria and urgency last night but feeling better this morning.  Tolerating liquids.  No nausea or vomiting.  Getting up walking around.  Objective:  Vital signs:  Vitals:   03/22/22 2136 03/23/22 0107 03/23/22 0500 03/23/22 0514  BP: (!) 155/82 (!) 155/83  (!) 150/80  Pulse: 88 84  84  Resp:  17  17  Temp: 98.6 F (37 C) 98.7 F (37.1 C)  98.6 F (37 C)  TempSrc: Oral Oral  Oral  SpO2: 99% 98%  98%  Weight:   75.6 kg   Height:        Last BM Date :  03/22/22 (via ostomy per patient)  Intake/Output   Yesterday:  12/07 0701 - 12/08 0700 In: 1913 [P.O.:360; I.V.:1353; IV Piggyback:200] Out: 1425 [Urine:1400; Blood:25] This shift:  Total I/O In: 463 [P.O.:360; I.V.:3; IV Piggyback:100] Out: 500 [Urine:500]  Bowel function:  Flatus: No  BM:  No  Drain: (No drain)   Physical Exam:  General: Pt awake/alert in no acute distress Eyes: PERRL, normal EOM.  Sclera clear.  No icterus Neuro: CN II-XII intact w/o focal sensory/motor deficits. Lymph: No  head/neck/groin lymphadenopathy Psych:  No delerium/psychosis/paranoia.  Oriented x 4 HENT: Normocephalic, Mucus membranes moist.  No thrush Neck: Supple, No tracheal deviation.  No obvious thyromegaly Chest: No pain to chest wall compression.  Good respiratory excursion.  No audible wheezing CV:  Pulses intact.  Regular rhythm.  No major extremity edema MS: Normal AROM mjr joints.  No obvious deformity  Abdomen: Soft.  Nondistended.  Mildly tender at incisions only.  Ileostomy dressing with old blood on it.  No leaking.  No evidence of peritonitis.  No incarcerated hernias.  Ext:   No deformity.  No mjr edema.  No cyanosis Skin: No petechiae / purpurea.  No major sores.  Warm and dry    Results:   Cultures: No results found for this or any previous visit (from the past 720 hour(s)).  Labs: Results for orders placed or performed during the hospital encounter of 03/22/22 (from the past 48 hour(s))  CBC     Status: Abnormal   Collection Time: 03/22/22  6:43 PM  Result Value Ref Range   WBC 9.6 4.0 - 10.5 K/uL   RBC 3.18 (L) 4.22 - 5.81 MIL/uL   Hemoglobin 10.5 (L) 13.0 - 17.0 g/dL   HCT 31.3 (L) 39.0 - 52.0 %   MCV 98.4 80.0 - 100.0 fL   MCH 33.0 26.0 - 34.0 pg   MCHC 33.5 30.0 - 36.0 g/dL   RDW 15.1 11.5 - 15.5 %   Platelets 144 (L) 150 - 400 K/uL   nRBC 0.0 0.0 - 0.2 %    Comment: Performed at [facility], [place] [address] [place], [place] [address]  Creatinine, serum     Status: None   Collection Time: 03/22/22  6:43 PM  Result Value Ref Range   Creatinine, Ser 0.84 0.61 - 1.24 mg/dL   GFR, Estimated >60 >60 mL/min    Comment: (NOTE) Calculated using the CKD-EPI Creatinine Equation (2021) Performed at [facility], [place] [address] [place], [place] [address]     Imaging / Studies: No results found.  Medications / Allergies: per chart  Antibiotics: Anti-infectives (From admission, onward)    Start     Dose/Rate Route  Frequency Ordered Stop   03/22/22 2200  cefoTEtan (CEFOTAN) 2 g in sodium chloride 0.9 % 100 mL IVPB        2 g 200 mL/hr over 30 Minutes Intravenous Every 12 hours 03/22/22 1715 03/22/22 2213   03/22/22 0915  cefoTEtan (CEFOTAN) 2 g in sodium chloride 0.9 % 100 mL IVPB        2 g 200 mL/hr over 30 Minutes Intravenous On call to O.R. 03/22/22 0902 03/22/22 1209         Note: Portions of this report may have been transcribed using voice recognition software. Every effort was made to ensure accuracy; however, inadvertent computerized transcription errors may be present.   Any transcriptional errors that result from this process are unintentional.    Remo Lipps C.  Jahron Hunsinger, MD, FACS, MASCRS Esophageal, Gastrointestinal & Colorectal Surgery Robotic and Minimally Invasive Surgery  [facility] [place] [address] [place], [place] [address] [phone] Fax [phone] Main  CONTACT INFORMATION:  Weekday (9AM-5PM): Call CCS main office at [phone]  Weeknight (5PM-9AM) or Weekend/Holiday: Check [email or web address] (password " [email or web address]") for General Surgery [facility] coverage  (Please, do not use SecureChat as it is not reliable communication to reach operating surgeons for immediate patient care given surgeries/outpatient duties/clinic/cross-coverage/off post-call which would lead to a delay in care.  Epic staff messaging available for outptient concerns, but may not be answered for 48 hours or more).     03/23/2022  6:47 AM

## 2022-03-23 NOTE — Progress Notes (Signed)
Prior-To-Admission Oral Chemotherapy for Treatment of Oncologic Disease   Order noted from Dr. Johney Maine to continue prior-to-admission oral chemotherapy regimen of imatinib.  Procedure Per Pharmacy & Therapeutics Committee Policy: Orders for continuation of home oral chemotherapy for treatment of an oncologic disease will be held unless approved by an oncologist during current admission.    For patients receiving oncology care at [facility], inpatient pharmacist contacts patient's oncologist during regular office hours to review. If earlier review is medically necessary, attending physician consults [facility] on-call oncologist  Spoke with patients oncologist, Dr. Julien Nordmann. Verbal order to continue imatinib.   Lenis Noon, PharmD 03/23/2022, 9:24 AM'

## 2022-03-24 LAB — CBC
HCT: 28.8 % — ABNORMAL LOW (ref 39.0–52.0)
Hemoglobin: 9.4 g/dL — ABNORMAL LOW (ref 13.0–17.0)
MCH: 32.4 pg (ref 26.0–34.0)
MCHC: 32.6 g/dL (ref 30.0–36.0)
MCV: 99.3 fL (ref 80.0–100.0)
Platelets: 145 10*3/uL — ABNORMAL LOW (ref 150–400)
RBC: 2.9 MIL/uL — ABNORMAL LOW (ref 4.22–5.81)
RDW: 15 % (ref 11.5–15.5)
WBC: 7.6 10*3/uL (ref 4.0–10.5)
nRBC: 0 % (ref 0.0–0.2)

## 2022-03-24 LAB — CREATININE, SERUM
Creatinine, Ser: 0.73 mg/dL (ref 0.61–1.24)
GFR, Estimated: >60 mL/min (ref >60)

## 2022-03-24 LAB — POTASSIUM: Potassium: 3.2 mmol/L — ABNORMAL LOW (ref 3.5–5.1)

## 2022-03-24 LAB — MAGNESIUM: Magnesium: 1.5 mg/dL — ABNORMAL LOW (ref 1.7–2.4)

## 2022-03-24 MED ORDER — POTASSIUM CHLORIDE CRYS ER 20 MEQ PO TBCR
40.0000 meq | EXTENDED_RELEASE_TABLET | Freq: Every day | ORAL | Status: DC
  Administered 2022-03-24: 40 meq via ORAL
  Filled 2022-03-24: qty 2

## 2022-03-24 MED ORDER — ACETAMINOPHEN 500 MG PO TABS
500.0000 mg | ORAL_TABLET | Freq: Four times a day (QID) | ORAL | Status: DC
  Administered 2022-03-24 – 2022-03-26 (×5): 500 mg via ORAL
  Filled 2022-03-24 (×6): qty 1

## 2022-03-24 MED ORDER — OXYCODONE HCL 5 MG PO TABS
5.0000 mg | ORAL_TABLET | Freq: Four times a day (QID) | ORAL | Status: DC | PRN
  Administered 2022-03-24: 10 mg via ORAL
  Filled 2022-03-24: qty 2

## 2022-03-24 NOTE — Progress Notes (Signed)
Jesse [name] [id number] [birth year]  CARE TEAM:  PCP: [name], MD  Outpatient Care Team: Patient Care Team: [name], MD as PCP - General (Family Medicine) Curt Bears, MD as Consulting Physician (Oncology) Viona Gilmore, [facility] as Pharmacist (Pharmacist) Michael Boston, MD as Consulting Physician (General Surgery) Gatha Mayer, MD as Consulting Physician (Gastroenterology) Garner Nash, DO as Consulting Physician (Pulmonary Disease) Alexis Frock, MD as Consulting Physician (Urology)  Inpatient Treatment Team: Treatment Team: Attending Provider: Michael Boston, MD; Mobility Specialist: Trevor Mace Charge Nurse: Heloise Ochoa, RN; Technician: Constance Goltz, NT; Utilization Review: Alease Medina, RN; Registered Nurse: Tanda Rockers, RN   Problem List:   Principal Problem:   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  Hypokalemia-replacing.  Recheck.  Magnesium as well  Keep dressing on for 72 hours and then remove postop day 3 along with the wicks per protocol = Sunday 12/10.  Some dysuria and hematuria most likely due to I&O cath.  Resolved.  No evidence of dysuria nor urinary retention   Follow bowel function.  He has a rather low coloanal anastomosis so he may struggle with some leaking incontinence or urgency.  We will see.  Hypertension control  GERD controlled.  Okay to resume Gleevec from my standpoint.  I have  discussed with Dr. Earlie Server in medical oncologist.  We do this regularly.  Pharmacy locked until they reached out to Dr. Julien Nordmann.  Back on it.  Patient is anxious to stay on it.  -VTE prophylaxis- SCDs, etc  -mobilize as tolerated to help recovery  Disposition:  Disposition:  The patient is from: Home  Anticipate discharge to:  Home  Anticipated Date of Discharge is:  December 10,2023    Barriers to discharge:  Pending Clinical improvement (more likely than not)  Patient currently is NOT MEDICALLY STABLE for discharge from the hospital from a surgery standpoint.      I reviewed nursing notes, last 24 h vitals and pain scores, last 48 h intake and output, last 24 h labs and trends, and last 24 h imaging results. I have reviewed this patient's available data, including medical history, events of note, test results, etc as part of my evaluation.  A significant portion of that time was spent in counseling.  Care during the described time interval was provided by me.  This care required moderate level of medical decision making.  03/24/2022    Subjective: (Chief complaint)  No flatus or bowel movement yet.  Walked in hallways.  Tolerating thicker liquids.  No nausea or bloating.  Soreness under better control.  Objective:  Vital signs:  Vitals:   03/23/22 1001 03/23/22 1345 03/24/22 0126 03/24/22 0502  BP: (!) 149/77 133/73 124/73 130/73  Pulse: 87 83 79 80  Resp: 18 18 16 14   Temp: 98.9 F (37.2 C) 99.3 F (37.4 C) 98.7 F (37.1 C) 98.7 F (37.1 C)  TempSrc: Oral Oral  Oral Oral  SpO2: 99% 98% 97% 97%  Weight:      Height:        Last BM Date : 03/22/22  Intake/Output   Yesterday:  12/08 0701 - 12/09 0700 In: 480 [P.O.:480] Out: 2380 [Urine:2380] This shift:  No intake/output data recorded.  Bowel function:  Flatus: No  BM:  No  Drain: (No drain)   Physical Exam:  General: Pt awake/alert in no acute distress Eyes: PERRL, normal EOM.  Sclera  clear.  No icterus Neuro: CN II-XII intact w/o focal sensory/motor deficits. Lymph: No head/neck/groin lymphadenopathy Psych:  No delerium/psychosis/paranoia.  Oriented x 4 HENT: Normocephalic, Mucus membranes moist.  No thrush Neck: Supple, No tracheal deviation.  No obvious thyromegaly Chest: No pain to chest wall compression.  Good respiratory excursion.  No audible wheezing CV:  Pulses intact.  Regular rhythm.  No major extremity edema MS: Normal AROM mjr joints.  No obvious deformity  Abdomen: Soft.  Mildy distended.  Nontender.  Ileostomy dressing with old blood on it.  No leaking.  No evidence of peritonitis.  No incarcerated hernias.  Ext:   No deformity.  No mjr edema.  No cyanosis Skin: No petechiae / purpurea.  No major sores.  Warm and dry    Results:   Cultures: No results found for this or any previous visit (from the past 720 hour(s)).  Labs: Results for orders placed or performed during the hospital encounter of 03/22/22 (from the past 48 hour(s))  Surgical pathology     Status: None   Collection Time: 03/22/22 12:52 PM  Result Value Ref Range   SURGICAL PATHOLOGY      SURGICAL PATHOLOGY CASE: [id number] PATIENT: Jesse [name] Surgical Pathology Report     Clinical History: Loop ileostomy in place for fecal diversion (crm)     FINAL MICROSCOPIC DIAGNOSIS:  A. COLOSTOMY: -  Fragment of skin and Small intestine with significant architectural distortion consistent with ileostomy reversal.   Sela Falk DESCRIPTION:  Received fresh is a 6 cm segment of colon with a rim of skin present at one end consistent with an ostomy site.  The mucosa is glistening, tan to hyperemic.  A section is submitted.  (GRP 03/22/2022)   Final Diagnosis performed by Tilford Pillar DO.   Electronically signed 03/23/2022 Technical and / or Professional components performed at [facility], [place] [address], [place], [place] [address].  Immunohistochemistry  Technical component (if applicable) was performed at [facility]. [address] [place], [place], [place] [address].   IMMUNOHISTOCHEMISTRY DISCLAIMER (if applic able): Some of these immunohistochemical stains may have been developed and the performance characteristics determine by [facility]. Some may not have been cleared or approved by the U.S. Food and Drug Administration. The FDA has determined that such clearance or approval is not necessary. This test is used for clinical purposes. It should not be regarded as investigational or for research. This laboratory is certified under the [place] (CLIA-88) as qualified to perform high complexity clinical laboratory testing.  The controls stained appropriately.   CBC     Status: Abnormal   Collection Time: 03/22/22  6:43 PM  Result Value Ref Range   WBC 9.6 4.0 - 10.5 K/uL   RBC 3.18 (L) 4.22 - 5.81 MIL/uL   Hemoglobin 10.5 (L) 13.0 - 17.0 g/dL   HCT 31.3 (L) 39.0 - 52.0 %   MCV 98.4 80.0 - 100.0 fL   MCH 33.0  26.0 - 34.0 pg   MCHC 33.5 30.0 - 36.0 g/dL   RDW 15.1 11.5 - 15.5 %   Platelets 144 (L) 150 - 400 K/uL   nRBC 0.0 0.0 - 0.2 %    Comment: Performed at [facility], [place] [address] [place], [place] [address]  Creatinine, serum     Status: None   Collection Time: 03/22/22  6:43 PM  Result Value Ref Range   Creatinine, Ser 0.84 0.61 - 1.24 mg/dL   GFR, Estimated >60 >60 mL/min    Comment: (NOTE) Calculated using the CKD-EPI Creatinine Equation (2021) Performed at [facility], [place] [address] [place], [place] [address]   CBC     Status: Abnormal   Collection Time: 03/23/22  7:27 AM  Result Value Ref Range   WBC 8.6 4.0 - 10.5 K/uL   RBC 2.74 (L) 4.22 - 5.81 MIL/uL   Hemoglobin 9.0 (L) 13.0 - 17.0 g/dL   HCT 26.4 (L) 39.0 - 52.0 %   MCV 96.4 80.0 - 100.0 fL   MCH 32.8 26.0 - 34.0 pg   MCHC 34.1 30.0 -  36.0 g/dL   RDW 15.1 11.5 - 15.5 %   Platelets 136 (L) 150 - 400 K/uL   nRBC 0.0 0.0 - 0.2 %    Comment: Performed at [facility], [place] [address] [place], [place] [address]  Magnesium     Status: Abnormal   Collection Time: 03/23/22  7:27 AM  Result Value Ref Range   Magnesium 1.4 (L) 1.7 - 2.4 mg/dL    Comment: Performed at [facility], [place] [address], [place], [place] [address]  Basic metabolic panel     Status: Abnormal   Collection Time: 03/23/22  7:27 AM  Result Value Ref Range   Sodium 132 (L) 135 - 145 mmol/L   Potassium 3.1 (L) 3.5 - 5.1 mmol/L   Chloride 98 98 - 111 mmol/L   CO2 25 22 - 32 mmol/L   Glucose, Bld 121 (H) 70 - 99 mg/dL    Comment: Glucose reference range applies only to samples taken after fasting for at least 8 hours.   BUN 16 8 - 23 mg/dL   Creatinine, Ser 0.73 0.61 - 1.24 mg/dL   Calcium 8.6 (L) 8.9 - 10.3 mg/dL   GFR, Estimated >60 >60 mL/min    Comment: (NOTE) Calculated using the CKD-EPI Creatinine Equation (2021)    Anion gap 9 5 - 15    Comment: Performed at [facility], [place] [address], [place], [place] [address]    Imaging / Studies: No results found.  Medications / Allergies: per chart  Antibiotics: Anti-infectives (From admission, onward)    Start     Dose/Rate Route Frequency Ordered Stop   03/22/22 2200  cefoTEtan (CEFOTAN) 2 g in sodium chloride 0.9 % 100 mL IVPB        2 g 200 mL/hr over 30 Minutes Intravenous Every 12 hours 03/22/22 1715 03/22/22 2213   03/22/22 0915  cefoTEtan (CEFOTAN) 2 g in sodium chloride 0.9 % 100 mL IVPB        2 g 200 mL/hr over 30 Minutes Intravenous On call to O.R. 03/22/22 0902 03/22/22 1209         Note: Portions of this report may have been transcribed using voice recognition software. Every effort was made to ensure accuracy; however, inadvertent computerized transcription errors may be present.   Any transcriptional errors that  result  from this process are unintentional.    Adin Hector, MD, FACS, MASCRS Esophageal, Gastrointestinal & Colorectal Surgery Robotic and Minimally Invasive Surgery  [facility] [address] [place], [place] [address] [phone] Fax [phone] Main  CONTACT INFORMATION:  Weekday (9AM-5PM): Call [facility] main office at [phone]  Weeknight (5PM-9AM) or Weekend/Holiday: Check [email or web address] (password " [email or web address]") for General Surgery [facility] coverage  (Please, do not use SecureChat as it is not reliable communication to reach operating surgeons for immediate patient care given surgeries/outpatient duties/clinic/cross-coverage/off post-call which would lead to a delay in care.  Epic staff messaging available for outptient concerns, but may not be answered for 48 hours or more).     03/24/2022  7:52 AM

## 2022-03-24 NOTE — TOC Initial Note (Signed)
Transition of Care [facility]) - Initial/Assessment Note    Patient Details  Name: Jesse [name] MRN: [id number] Date of Birth: 12/08/1950  Transition of Care [facility]) CM/SW Contact:    Henrietta Dine, RN Phone Number: 03/24/2022, 6:12 PM  Clinical Narrative:                  Transition of Care [facility]) Screening Note   Patient Details  Name: Jesse [name] Date of Birth: 1951/04/03   Transition of Care [facility]) CM/SW Contact:    Henrietta Dine, RN Phone Number: 03/24/2022, 6:13 PM    Transition of Care Department [facility]) has reviewed patient and no TOC needs have been identified at this time. We will continue to monitor patient advancement through interdisciplinary progression rounds. If new patient transition needs arise, please place a TOC consult.          Patient Goals and CMS Choice        Expected Discharge Plan and Services                                                Prior Living Arrangements/Services                       Activities of Daily Living Home Assistive Devices/Equipment: Eyeglasses ADL Screening (condition at time of admission) Patient's cognitive ability adequate to safely complete daily activities?: Yes Is the patient deaf or have difficulty hearing?: No Does the patient have difficulty seeing, even when wearing glasses/contacts?: No Does the patient have difficulty concentrating, remembering, or making decisions?: No Patient able to express need for assistance with ADLs?: Yes Does the patient have difficulty dressing or bathing?: No Independently performs ADLs?: Yes (appropriate for developmental age) Does the patient have difficulty walking or climbing stairs?: No Weakness of Legs: None Weakness of Arms/Hands: None  Permission Sought/Granted                  Emotional Assessment              Admission diagnosis:  S/P colostomy takedown [Z98.890] Patient Active Problem List   Diagnosis Date  Noted   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[place]) 08/21/2021   Encounter for antineoplastic chemotherapy 01/28/2020   Adenocarcinoma of left lung, stage 2 ([place]) 12/31/2019   Goals of care, counseling/discussion 12/31/2019   S/P lobectomy of lung 12/16/2019   Lung nodule 10/13/2019   Essential hypertension 09/28/2016   Coronary artery calcification 09/28/2016   Impaired glucose tolerance 09/28/2016   NEOPLASM, MALIGNANT, BLADDER, HX OF 07/18/2007   Vitamin D deficiency 06/03/2007   DIVERTICULOSIS, COLON 01/14/2007   Osteoarthritis 01/14/2007   Dyslipidemia 01/09/2007   GERD 01/09/2007   REFLUX ESOPHAGITIS 06/01/2004   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 [place]  [address] Phone: [phone] Fax: [place], [place] [address] [place] [address] [place] [address] Phone: [phone] Fax: [phone]     Social Determinants of Health (SDOH) Interventions  Readmission Risk Interventions     No data to display

## 2022-03-25 MED ORDER — POTASSIUM CHLORIDE 10 MEQ/100ML IV SOLN
INTRAVENOUS | Status: AC
  Administered 2022-03-25: 10 meq
  Filled 2022-03-25: qty 100

## 2022-03-25 MED ORDER — POTASSIUM CHLORIDE CRYS ER 20 MEQ PO TBCR
40.0000 meq | EXTENDED_RELEASE_TABLET | Freq: Two times a day (BID) | ORAL | Status: DC
  Administered 2022-03-25 – 2022-03-26 (×3): 40 meq via ORAL
  Filled 2022-03-25 (×3): qty 2

## 2022-03-25 MED ORDER — POTASSIUM CHLORIDE 10 MEQ/100ML IV SOLN
10.0000 meq | INTRAVENOUS | Status: AC
  Administered 2022-03-25 (×3): 10 meq via INTRAVENOUS
  Filled 2022-03-25 (×3): qty 100

## 2022-03-25 MED ORDER — VITAMIN C 500 MG PO TABS
500.0000 mg | ORAL_TABLET | Freq: Every day | ORAL | Status: DC
  Administered 2022-03-25 – 2022-03-26 (×2): 500 mg via ORAL
  Filled 2022-03-25 (×2): qty 1

## 2022-03-25 MED ORDER — FERROUS SULFATE 325 (65 FE) MG PO TABS
325.0000 mg | ORAL_TABLET | Freq: Two times a day (BID) | ORAL | Status: DC
  Administered 2022-03-25 – 2022-03-26 (×3): 325 mg via ORAL
  Filled 2022-03-25 (×3): qty 1

## 2022-03-25 MED ORDER — MAGNESIUM SULFATE 4 GM/100ML IV SOLN
4.0000 g | Freq: Once | INTRAVENOUS | Status: AC
  Administered 2022-03-25: 4 g via INTRAVENOUS
  Filled 2022-03-25: qty 100

## 2022-03-25 MED ORDER — VITAMIN C 500 MG PO TABS: 500.0000 mg | ORAL_TABLET | Freq: Every day | ORAL | Status: DC

## 2022-03-25 NOTE — Progress Notes (Signed)
Mobility Specialist - Progress Note   03/25/22 1615  Mobility  Activity Ambulated with assistance in hallway  Level of Assistance Modified independent, requires aide device or extra time  Assistive Device  (IV Pole)  Distance Ambulated (ft) 350 ft  Activity Response Tolerated well  Mobility Referral Yes  $Mobility charge 1 Mobility   Pt received in bed and agreeable to mobility. No complaints during mobility session. Pt to EOB after session with all needs met.    [facility]

## 2022-03-25 NOTE — Progress Notes (Signed)
Jesse [name] [id number] 02-19-1951  CARE TEAM:  PCP: [name], MD  Outpatient Care Team: Patient Care Team: [name], MD as PCP - General (Family Medicine) Curt Bears, MD as Consulting Physician (Oncology) Viona Gilmore, [facility] as Pharmacist (Pharmacist) Michael Boston, MD as Consulting Physician (General Surgery) Gatha Mayer, MD as Consulting Physician (Gastroenterology) Garner Nash, DO as Consulting Physician (Pulmonary Disease) Alexis Frock, MD as Consulting Physician (Urology)  Inpatient Treatment Team: Treatment Team: Attending Provider: Michael Boston, MD; Mobility Specialist: Trevor Mace; Charge Nurse: Sunny Schlein, RN; Technician: Constance Goltz, NT; Utilization Review: Alease Medina, RN   Problem List:   Principal Problem:   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  Sphincter is intact.  Will take some time for his neorectum to adapt over this coming year.  And thicken up stools and gently slow down.  Bedside commode.  Will order for home as well.  Hopefully should stabilize.  Fiber bowel regimen - soluble fiber.  Add iron and vitamin C to help thicken and constipate.  Hypokalemia-replacing.  Follow   Hypomagnesemia-following   Some dysuria and hematuria most likely due to I&O cath.  Resolved.  No evidence of dysuria nor urinary retention   Hypertension control  GERD controlled.  Okay to resume Gleevec  from my standpoint.  I have discussed with Dr. Earlie Server in medical oncologist.  We do this regularly.  Pharmacy locked until they reached out to Dr. Julien Nordmann.  Back on it.  Patient is anxious to stay on it.  -VTE prophylaxis- SCDs, etc  -mobilize as tolerated to help recovery  Disposition:  Disposition:  The patient is from: Home  Anticipate discharge to:  Home  Anticipated Date of Discharge is:  December 11,2023    Barriers to discharge:  Pending Clinical improvement (more likely than not)  Patient currently is NOT MEDICALLY STABLE for discharge from the hospital from a surgery standpoint.      I reviewed nursing notes, last 24 h vitals and pain scores, last 48 h intake and output, last 24 h labs and trends, and last 24 h imaging results. I have reviewed this patient's available data, including medical history, events of note, test results, etc as part of my evaluation.  A significant portion of that time was spent in counseling.  Care during the described time interval was provided by me.  This care required moderate level of medical decision making.  03/25/2022    Subjective: (Chief complaint)  Patient with numerous loose stools with incontinence.  Low potassium magnesium getting replaced.  Pain better controlled.  Trying solid diet.  Objective:  Vital signs:  Vitals:   03/24/22 1339 03/24/22 2052 03/25/22 0500 03/25/22 0501  BP: 114/71 111/61  126/72  Pulse: 88 91  97  Resp: 16 16  16   Temp: 98.2 F (36.8 C) 99.1 F (37.3 C)  97.8 F (36.6 C)  TempSrc: Oral Oral  Oral  SpO2: 97% 98%  99%  Weight:   66.8 kg   Height:        Last BM Date : 03/22/22  Intake/Output   Yesterday:  12/09 0701 - 12/10 0700 In: 900 [P.O.:900] Out: 1000 [Urine:1000] This shift:  No intake/output data recorded.  Bowel function:  Flatus: YES  BM:  YES  Drain: (No drain)   Physical Exam:  General: Pt awake/alert in no acute distress Eyes: PERRL, normal EOM.  Sclera  clear.  No icterus Neuro: CN II-XII intact w/o focal sensory/motor deficits. Lymph: No head/neck/groin lymphadenopathy Psych:  No delerium/psychosis/paranoia.  Oriented x 4 HENT: Normocephalic, Mucus membranes moist.  No thrush Neck: Supple, No tracheal deviation.  No obvious thyromegaly Chest: No pain to chest wall compression.  Good respiratory excursion.  No audible wheezing CV:  Pulses intact.  Regular rhythm.  No major extremity edema MS: Normal AROM mjr joints.  No obvious deformity  Abdomen: Soft.  Nondistended.  Nontender.  Ileostomy dressing moved.  Wick removed.  No active bleeding.  No leaking.  No evidence of peritonitis.  No incarcerated hernias.  Ext:   No deformity.  No mjr edema.  No cyanosis Skin: No petechiae / purpurea.  No major sores.  Warm and dry    Results:   Cultures: No results found for this or any previous visit (from the past 720 hour(s)).  Labs: Results for orders placed or performed during the hospital encounter of 03/22/22 (from the past 48 hour(s))  Magnesium     Status: Abnormal   Collection Time: 03/24/22  9:29 AM  Result Value Ref Range   Magnesium 1.5 (L) 1.7 - 2.4 mg/dL    Comment: Performed at [facility], [place] [address] [place], [place] [address]  Potassium     Status: Abnormal   Collection Time: 03/24/22  9:29 AM  Result Value Ref Range   Potassium 3.2 (L) 3.5 - 5.1 mmol/L    Comment: Performed at [facility], [place] [address] [place], [place] [address]  Creatinine, serum     Status: None   Collection Time: 03/24/22  9:29 AM  Result Value Ref Range   Creatinine, Ser 0.73 0.61 - 1.24 mg/dL   GFR, Estimated >60 >60 mL/min    Comment: (NOTE) Calculated using the CKD-EPI Creatinine Equation (2021) Performed at [facility], [place] [address], [place], [place] [address]   CBC     Status: Abnormal   Collection Time: 03/24/22  9:29 AM  Result Value Ref Range   WBC 7.6 4.0 - 10.5  K/uL   RBC 2.90 (L) 4.22 - 5.81 MIL/uL   Hemoglobin 9.4 (L) 13.0 - 17.0 g/dL   HCT 28.8 (L) 39.0 - 52.0 %   MCV 99.3 80.0 - 100.0 fL   MCH 32.4 26.0 - 34.0 pg   MCHC 32.6 30.0 - 36.0 g/dL   RDW 15.0 11.5 - 15.5 %   Platelets 145 (L) 150 - 400 K/uL   nRBC 0.0 0.0 - 0.2 %    Comment: Performed at [facility], [place] [address] [place], [place] [address]    Imaging / Studies: No results found.  Medications / Allergies: per chart  Antibiotics: Anti-infectives (From admission, onward)    Start     Dose/Rate Route Frequency Ordered Stop   03/22/22 2200  cefoTEtan (CEFOTAN) 2 g in sodium chloride 0.9 % 100 mL IVPB        2 g 200 mL/hr over  30 Minutes Intravenous Every 12 hours 03/22/22 1715 03/22/22 2213   03/22/22 0915  cefoTEtan (CEFOTAN) 2 g in sodium chloride 0.9 % 100 mL IVPB        2 g 200 mL/hr over 30 Minutes Intravenous On call to O.R. 03/22/22 0902 03/22/22 1209         Note: Portions of this report may have been transcribed using voice recognition software. Every effort was made to ensure accuracy; however, inadvertent computerized transcription errors may be present.   Any transcriptional errors that result from this process are unintentional.    Adin Hector, MD, FACS, MASCRS Esophageal, Gastrointestinal & Colorectal Surgery Robotic and Minimally Invasive Surgery  [facility] [place]. [address] [place], [place] [address] [phone] Fax [phone] Main  CONTACT INFORMATION:  Weekday (9AM-5PM): Call [facility] main office at [phone]  Weeknight (5PM-9AM) or Weekend/Holiday: Check [email or web address] (password " [email or web address]") for General Surgery [facility] coverage  (Please, do not use SecureChat as it is not reliable communication to reach operating surgeons for immediate patient care given surgeries/outpatient duties/clinic/cross-coverage/off post-call which would lead to a delay in care.  Epic  staff messaging available for outptient concerns, but may not be answered for 48 hours or more).     03/25/2022  7:33 AM

## 2022-03-26 LAB — CBC
HCT: 23.7 % — ABNORMAL LOW (ref 39.0–52.0)
Hemoglobin: 7.8 g/dL — ABNORMAL LOW (ref 13.0–17.0)
MCH: 32.5 pg (ref 26.0–34.0)
MCHC: 32.9 g/dL (ref 30.0–36.0)
MCV: 98.8 fL (ref 80.0–100.0)
Platelets: 116 10*3/uL — ABNORMAL LOW (ref 150–400)
RBC: 2.4 MIL/uL — ABNORMAL LOW (ref 4.22–5.81)
RDW: 14.8 % (ref 11.5–15.5)
WBC: 4.5 10*3/uL (ref 4.0–10.5)
nRBC: 0 % (ref 0.0–0.2)

## 2022-03-26 LAB — CREATININE, SERUM
Creatinine, Ser: 0.74 mg/dL (ref 0.61–1.24)
GFR, Estimated: >60 mL/min (ref >60)

## 2022-03-26 LAB — SURGICAL PATHOLOGY

## 2022-03-26 LAB — MAGNESIUM: Magnesium: 2 mg/dL (ref 1.7–2.4)

## 2022-03-26 LAB — POTASSIUM: Potassium: 4.2 mmol/L (ref 3.5–5.1)

## 2022-03-26 MED ORDER — FERROUS SULFATE 325 (65 FE) MG PO TBEC: 325.0000 mg | DELAYED_RELEASE_TABLET | Freq: Two times a day (BID) | ORAL | 1 refills | Status: DC

## 2022-03-26 MED ORDER — LOPERAMIDE HCL 2 MG PO CAPS: 4.0000 mg | ORAL_CAPSULE | Freq: Once | ORAL | Status: DC

## 2022-03-26 MED ORDER — LOPERAMIDE HCL 2 MG PO CAPS: 2.0000 mg | ORAL_CAPSULE | Freq: Four times a day (QID) | ORAL | Status: DC | PRN

## 2022-03-26 MED ORDER — SODIUM CHLORIDE 0.9 % IV SOLN
125.0000 mg | Freq: Once | INTRAVENOUS | Status: AC
  Administered 2022-03-26: 125 mg via INTRAVENOUS
  Filled 2022-03-26: qty 10

## 2022-03-26 NOTE — Discharge Summary (Addendum)
Physician Discharge Summary    Patient ID: Jesse [name] MRN: [id number] DOB/AGE: 09-08-1950  71 y.o.  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  Tolerates index finger = 15 x 20 mm.  Intact sphincter.    Gently dilated.  Anastomosis well healed.   Moderate adhesions of loop ileostomy to abdominal wall.  No parastomal hernia.   It is a side-to-side stapled ileoileal anastomosis close to the terminal ileum.     Consults: Case Management / Social Work, Physical Therapy, and Anesthesia  Hospital Course:   The patient underwent the surgery above.  Postoperatively, the patient gradually mobilized and advanced to a solid diet.  Pain and other symptoms were treated aggressively.    By the time of discharge, the patient was walking well the hallways, eating food, having flatus.  Hypokalemia and hypomagnesemia treated aggressively.  Patient did have some urge fecal incontinence that  got better control with the help of constipating agents.  Pain was well-controlled on an oral medications.  Based on meeting discharge criteria and continuing to recover, I felt it was safe for the patient to be discharged from the hospital to further recover with close followup. Postoperative recommendations were discussed in detail.  They are written as well.  Discharged Condition: good  Discharge Exam: Blood pressure 110/74, pulse 84, temperature 98.4 F (36.9 C), temperature source Oral, resp. rate 16, height 5\' 9"  (1.753 m), weight 66.8 kg, SpO2 99 %.  General: Pt awake/alert/oriented x4 in No acute distress Eyes: PERRL, normal EOM.  Sclera clear.  No icterus Neuro: CN II-XII intact w/o focal sensory/motor deficits. Lymph: No head/neck/groin lymphadenopathy Psych:  No delerium/psychosis/paranoia HENT: Normocephalic, Mucus membranes moist.  No thrush Neck: Supple, No tracheal deviation Chest:  No chest wall pain w good excursion CV:  Pulses intact.  Regular rhythm MS: Normal AROM mjr joints.  No obvious deformity Abdomen: Soft.  Nondistended.  Mildly tender at incisions only.  Old ileostomy wound with no bleeding and superficial.  No evidence of peritonitis.  No incarcerated hernias. Ext:  SCDs BLE.  No mjr edema.  No cyanosis Skin: No petechiae / purpura.  Left axillary incision with normal healing ridge.   Disposition:    Follow-up Information     Michael Boston, MD Follow up on 04/12/2022.   Specialties: General Surgery, Colon and Rectal Surgery Why: To follow up after your operation                Discharge disposition: 01-Home or Self Care  Discharge Instructions     Call MD for:   Complete by: As directed    FEVER > 101.5 F  (temperatures < 101.5 F are not significant)   Call MD for:  extreme fatigue   Complete by: As directed    Call MD for:  persistant dizziness or light-headedness   Complete by: As directed    Call MD for:  persistant nausea  and vomiting   Complete by: As directed    Call MD for:  redness, tenderness, or signs of infection (pain, swelling, redness, odor or green/yellow discharge around incision site)   Complete by: As directed    Call MD for:  severe uncontrolled pain   Complete by: As directed    Diet - low sodium heart healthy   Complete by: As directed    Start with a bland diet such as soups, liquids, starchy foods, low fat foods, etc. the first few days at home. Gradually advance to a solid, low-fat, high fiber diet by the end of the first week at home.   Add a fiber supplement to your diet (Metamucil, etc) If you feel full, bloated, or constipated, stay on a full liquid or pureed/blenderized diet for a few days until you feel better and are no longer constipated.   Discharge instructions   Complete by: As directed    See Discharge Instructions If you are not getting better after two weeks or are noticing you are getting worse, contact our office [phone] for further advice.  We may need to adjust your medications, re-evaluate you in the office, send you to the emergency room, or see what other things we can do to help. The clinic staff is available to answer your questions during regular business hours (8:30am-5pm).  Please don't hesitate to call and ask to speak to one of our nurses for clinical concerns.    A surgeon from [facility] is always on call at the hospitals 24 hours/day If you have a medical emergency, go to the nearest emergency room or call 911.   Discharge wound care:   Complete by: As directed    It is good for closed incisions and even open wounds to be washed every day.  Shower every day.  Short baths are fine.  Wash the incisions and wounds clean with soap & water.    You may leave closed incisions open to air if it is dry.   You may cover the incision with clean gauze & replace it after your daily shower for comfort.  TEGADERM:  You have clear gauze band-aid dressings  over your closed incision(s).  Remove the dressings & shoelace ribbon wicks 3 days after surgery = Sunday 12/10   Driving Restrictions   Complete by: As directed    You may drive when: - you are no longer taking narcotic prescription pain medication - you can comfortably wear a seatbelt - you can safely make sudden turns/stops without pain.   Increase activity slowly   Complete by: As directed    Start light daily activities --- self-care, walking, climbing stairs- beginning the day after surgery.  Gradually increase activities as tolerated.  Control your pain to be active.  Stop when you are tired.  Ideally, walk several times a day, eventually an hour a day.   Most people are back to most day-to-day activities in a few weeks.  It takes 4-6 weeks to get back to unrestricted, intense activity. If you can walk 30 minutes  without difficulty, it is safe to try more intense activity such as jogging, treadmill, bicycling, low-impact aerobics, swimming, etc. Save the most intensive and strenuous activity for last (Usually 4-8 weeks after surgery) such as sit-ups, heavy lifting, contact sports, etc.  Refrain from any intense heavy lifting or straining until you are off narcotics for pain control.  You will have off days, but things should improve week-by-week. DO NOT PUSH THROUGH PAIN.  Let pain be your guide: If it hurts to do something, don't do it.   Lifting restrictions   Complete by: As directed    If you can walk 30 minutes without difficulty, it is safe to try more intense activity such as jogging, treadmill, bicycling, low-impact aerobics, swimming, etc. Save the most intensive and strenuous activity for last (Usually 4-8 weeks after surgery) such as sit-ups, heavy lifting, contact sports, etc.   Refrain from any intense heavy lifting or straining until you are off narcotics for pain control.  You will have off days, but things should improve week-by-week. DO NOT PUSH THROUGH PAIN.  Let pain be  your guide: If it hurts to do something, don't do it.  Pain is your body warning you to avoid that activity for another week until the pain goes down.   May shower / Bathe   Complete by: As directed    May walk up steps   Complete by: As directed    Remove dressing in 72 hours   Complete by: As directed    Make sure all dressings are removed by the third day after surgery.  Leave incisions open to air.  OK to cover incisions with gauze or bandages as desired   Sexual Activity Restrictions   Complete by: As directed    You may have sexual intercourse when it is comfortable. If it hurts to do something, stop.       Allergies as of 03/26/2022   No Known Allergies      Medication List     STOP taking these medications    traMADol 50 MG tablet Commonly known as: ULTRAM       TAKE these medications    acetaminophen 500 MG tablet Commonly known as: TYLENOL Take 1,000 mg by mouth every 8 (eight) hours as needed for moderate pain.   amLODipine 5 MG tablet Commonly known as: NORVASC Take 1 tablet (5 mg total) by mouth daily.   atorvastatin 40 MG tablet Commonly known as: LIPITOR Take 1/2 tablet (20 mg total) by mouth daily.   ferrous sulfate 325 (65 FE) MG EC tablet Take 1 tablet (325 mg total) by mouth 2 (two) times daily.   imatinib 400 MG tablet Commonly known as: GLEEVEC Take 1 tablet (400 mg total) by mouth daily. Take with meals and large glass of water.Caution:Chemotherapy. What changed: when to take this   omeprazole 40 MG capsule Commonly known as: PRILOSEC Take 1 capsule (40 mg total) by mouth daily.   OVER THE COUNTER MEDICATION Take 3 tablets by mouth daily.  Balance of Nature Fruits   OVER THE COUNTER MEDICATION Take 3 tablets by mouth daily. Balance of Nature Vegetable   oxyCODONE 5 MG immediate release tablet Commonly known as: Oxy IR/ROXICODONE Take 1 tablet (5 mg total) by mouth every 6 (six) hours as needed for severe pain or breakthrough  pain. What changed: reasons to take this   PreserVision AREDS 2 Caps Take 1 capsule by mouth 2 (two) times daily.   UNABLE TO FIND Take  by mouth daily. Med Name: Balance of Petra Kuba   VITAMIN D3 PO Take 1 capsule by mouth daily.               Durable Medical Equipment  (From admission, onward)           Start     Ordered   03/25/22 0732  For home use only DME Bedside commode  Once       Question:  Patient needs a bedside commode to treat with the following condition  Answer:  Fecal incontinence   03/25/22 0731              Discharge Care Instructions  (From admission, onward)           Start     Ordered   03/22/22 0000  Discharge wound care:       Comments: It is good for closed incisions and even open wounds to be washed every day.  Shower every day.  Short baths are fine.  Wash the incisions and wounds clean with soap & water.    You may leave closed incisions open to air if it is dry.   You may cover the incision with clean gauze & replace it after your daily shower for comfort.  TEGADERM:  You have clear gauze band-aid dressings over your closed incision(s).  Remove the dressings & shoelace ribbon wicks 3 days after surgery = Sunday 12/10   03/22/22 1116            Significant Diagnostic Studies:  Results for orders placed or performed during the hospital encounter of 03/22/22 (from the past 72 hour(s))  Magnesium     Status: Abnormal   Collection Time: 03/24/22  9:29 AM  Result Value Ref Range   Magnesium 1.5 (L) 1.7 - 2.4 mg/dL    Comment: Performed at [facility], [place] [address] [place], [place] [address]  Potassium     Status: Abnormal   Collection Time: 03/24/22  9:29 AM  Result Value Ref Range   Potassium 3.2 (L) 3.5 - 5.1 mmol/L    Comment: Performed at [facility], [place] [address], [place], [place] [address]  Creatinine, serum     Status: None   Collection Time: 03/24/22  9:29 AM  Result Value  Ref Range   Creatinine, Ser 0.73 0.61 - 1.24 mg/dL   GFR, Estimated >60 >60 mL/min    Comment: (NOTE) Calculated using the CKD-EPI Creatinine Equation (2021) Performed at [facility], [place] [address], [place], [place] [address]   CBC     Status: Abnormal   Collection Time: 03/24/22  9:29 AM  Result Value Ref Range   WBC 7.6 4.0 - 10.5 K/uL   RBC 2.90 (L) 4.22 - 5.81 MIL/uL   Hemoglobin 9.4 (L) 13.0 - 17.0 g/dL   HCT 28.8 (L) 39.0 - 52.0 %   MCV 99.3 80.0 - 100.0 fL   MCH 32.4 26.0 - 34.0 pg   MCHC 32.6 30.0 - 36.0 g/dL   RDW 15.0 11.5 - 15.5 %   Platelets 145 (L) 150 - 400 K/uL   nRBC 0.0 0.0 - 0.2 %    Comment: Performed at [facility], [place] [address] [place],  [address]  CBC     Status: Abnormal   Collection Time: 03/26/22  4:05 AM  Result Value Ref Range   WBC 4.5 4.0 - 10.5 K/uL   RBC 2.40 (L) 4.22 - 5.81  MIL/uL   Hemoglobin 7.8 (L) 13.0 - 17.0 g/dL   HCT 23.7 (L) 39.0 - 52.0 %   MCV 98.8 80.0 - 100.0 fL   MCH 32.5 26.0 - 34.0 pg   MCHC 32.9 30.0 - 36.0 g/dL   RDW 14.8 11.5 - 15.5 %   Platelets 116 (L) 150 - 400 K/uL   nRBC 0.0 0.0 - 0.2 %    Comment: Performed at [facility], [place] [address] [place], [place] [address]  Magnesium     Status: None   Collection Time: 03/26/22  4:05 AM  Result Value Ref Range   Magnesium 2.0 1.7 - 2.4 mg/dL    Comment: Performed at [facility], [place] [address] [place], [place] [address]  Potassium     Status: None   Collection Time: 03/26/22  4:05 AM  Result Value Ref Range   Potassium 4.2 3.5 - 5.1 mmol/L    Comment: Performed at [facility], [place] [address] [place], [place] [address]  Creatinine, serum     Status: None   Collection Time: 03/26/22  4:05 AM  Result Value Ref Range   Creatinine, Ser 0.74 0.61 - 1.24 mg/dL   GFR, Estimated >60 >60 mL/min    Comment: (NOTE) Calculated using the CKD-EPI Creatinine Equation  (2021) Performed at [facility], [place] [address] [place], [place] [address]     No results found.  Past Medical History:  Diagnosis Date   Anemia    Cancer ([place])    bladder cancer   COPD (chronic obstructive pulmonary disease) ([place])    mild    DIVERTICULOSIS, COLON 01/14/2007   Qualifier: Diagnosis of  By: Paulina Fusi RN, [name]    GASTRITIS, CHRONIC 06/01/2004   Qualifier: Diagnosis of  By: Nolon Rod CMA (AAMA), Robin     GERD 01/09/2007   Qualifier: Diagnosis of  By: Sherren Mocha, RN, Ellen     Heart murmur    as a child; no murmur heard 12/14/19   HYPERLIPIDEMIA 01/09/2007   Qualifier: Diagnosis of  By: Sherren Mocha RN, Dorian Pod     Hypertension    Lung mass    left upper nodule   Macular degeneration    right eye   NEOPLASM, MALIGNANT, BLADDER, HX OF 2002   Qualifier: Diagnosis of  By: Nolon Rod CMA (AAMA), Robin  / no chemo or radiation   OSTEOARTHRITIS 01/14/2007   Qualifier: Diagnosis of  By: Paulina Fusi, RN, [name] - knees   Rectal mass    Reflux esophagitis 06/01/2004   Qualifier: Diagnosis of  By: Nolon Rod CMA (AAMA), Robin     TOBACCO USER 02/16/2009   Qualifier: Diagnosis of  By: Burnice Logan  MD, Doretha Sou    VITAMIN D DEFICIENCY 06/03/2007   Qualifier: Diagnosis of  By: Burnice Logan  MD, Doretha Sou     Past Surgical History:  Procedure Laterality Date   BREAST BIOPSY Left 03/16/2022   [place] LT RADIOACTIVE SEED LOC 03/16/2022 [facility]   BRONCHIAL BIOPSY  10/13/2019   Procedure: BRONCHIAL BIOPSIES;  Surgeon: Garner Nash, DO;  Location: [facility];  Service: Pulmonary;;   BRONCHIAL BRUSHINGS  10/13/2019   Procedure: BRONCHIAL BRUSHINGS;  Surgeon: Garner Nash, DO;  Location: [facility];  Service: Pulmonary;;   BRONCHIAL NEEDLE ASPIRATION BIOPSY  10/13/2019   Procedure: BRONCHIAL NEEDLE ASPIRATION BIOPSIES;  Surgeon: Garner Nash, DO;  Location: [facility];  Service: Pulmonary;;   BRONCHIAL WASHINGS  10/13/2019   Procedure: BRONCHIAL WASHINGS;  Surgeon: Garner Nash, DO;  Location: [facility];  Service: Pulmonary;;   COLONOSCOPY     multiple   DIVERTING ILEOSTOMY N/A 12/14/2021   Procedure: DIVERTING ILEOSTOMY;  Surgeon: Michael Boston, MD;  Location: [facility];  Service: General;  Laterality: N/A;   ILEOSTOMY CLOSURE N/A 03/22/2022   Procedure: OPEN TAKEDOWN OF LOOP ILEOSTOMY;  Surgeon: Michael Boston, MD;  Location: [facility];  Service: General;  Laterality: N/A;  GEN w/ERAS PATHWAY LOCAL   INTERCOSTAL NERVE BLOCK Left 12/16/2019   Procedure: INTERCOSTAL NERVE BLOCK;  Surgeon: Lajuana Matte, MD;  Location: [facility];  Service: Thoracic;  Laterality: Left;   KNEE SURGERY  07/20/2008   bilat.   NASAL SEPTUM SURGERY  1995   NODE DISSECTION Left 12/16/2019   Procedure: NODE DISSECTION;  Surgeon: Lajuana Matte, MD;  Location: [facility];  Service: Thoracic;  Laterality: Left;   RADIOACTIVE SEED GUIDED AXILLARY SENTINEL LYMPH NODE Left 03/20/2022   Procedure: RADIOACTIVE SEED GUIDED LEFT AXILLARY SENTINEL LYMPH NODE BIOPSY;  Surgeon: Erroll Luna, MD;  Location: [facility];  Service: General;  Laterality: Left;   RECTAL EXAM UNDER ANESTHESIA N/A 03/22/2022   Procedure: ANORECTAL EXAMINATION UNDER ANESTHESIA;  Surgeon: Michael Boston, MD;  [facility];  Service: General;  Laterality: N/A;   TONSILLECTOMY     TRANSURETHRAL RESECTION OF BLADDER TUMOR  2002   UPPER GASTROINTESTINAL ENDOSCOPY     VIDEO BRONCHOSCOPY WITH ENDOBRONCHIAL NAVIGATION N/A 10/13/2019   Procedure: VIDEO BRONCHOSCOPY WITH ENDOBRONCHIAL NAVIGATION;  Surgeon: Garner Nash, DO;  Location: [facility];  Service: Pulmonary;  Laterality: N/A;   WISDOM TOOTH EXTRACTION     XI ROBOTIC ASSISTED LOWER ANTERIOR RESECTION N/A 12/14/2021   Procedure: XI ROBOTIC ASSISTED LOWER ANTERIOR ULTRA LOW RECTOSIGMOID RESECTION, COLOANAL HAND SEWN ANASTOMOSIS, AND BILATERAL TAP BLOCK;  Surgeon: Michael Boston, MD;  [facility];  Service: General;  Laterality: N/A;    Social History    Socioeconomic History   Marital status: Married    Spouse name: Not on file   Number of children: 3   Years of education: Not on file   Highest education level: Not on file  Occupational History    Comment: semi retired   Tobacco Use   Smoking status: Former    Packs/day: 0.50    Years: 40.00    Total pack years: 20.00    Types: Cigarettes    Quit date: 09/21/2019    Years since quitting: 2.5   Smokeless tobacco: Never  Vaping Use   Vaping Use: Never used  Substance and Sexual Activity   Alcohol use: Yes    Alcohol/week: 15.0 standard drinks of alcohol    Types: 15 Standard drinks or equivalent per week    Comment: wine/beer/martini   Drug use: Not Currently    Types: Marijuana    Comment: Smokes marijuana occasional, last use in 08/2019   Sexual activity: Not Currently  Other Topics Concern   Not on file  Social History Narrative   Married   3 children: 3 grandchildren; 3 great grandchildren   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   Enjoys yard work and [place] Strain: [place]  (07/18/2021)   Overall Financial Resource Strain ([place])    Difficulty of Paying Living Expenses: Not hard at [place]: No Spray (03/22/2022)   Hunger Vital Sign    Worried About Running Out of Food in the  Last Year: Never true    [place] in the Last Year: Never true  Transportation Needs: No Transportation Needs (03/22/2022)   PRAPARE - Hydrologist (Medical): No    Lack of Transportation (Non-Medical): No  Physical Activity: Sufficiently Active (07/18/2021)   Exercise Vital Sign    Days of Exercise per Week: 7 days    Minutes of Exercise per Session: 60 min  Stress: No Stress Concern Present (07/18/2021)   [place]    Feeling of Stress : Not at all  Social Connections: Moderately Isolated (07/12/2020)    Social Connection and Isolation Panel [NHANES]    Frequency of Communication with Friends and Family: More than three times a week    Frequency of Social Gatherings with Friends and Family: More than three times a week    Attends Religious Services: Never    Marine scientist or Organizations: No    Attends Archivist Meetings: Never    Marital Status: Married  Human resources officer Violence: Not At Risk (03/22/2022)   Humiliation, Afraid, Rape, and Kick questionnaire    Fear of Current or Ex-Partner: No    Emotionally Abused: No    Physically Abused: No    Sexually Abused: No    Family History  Problem Relation Age of Onset   Dementia Mother    Diabetes Father    Mental illness Father    Peripheral vascular disease Brother     Current Facility-Administered Medications  Medication Dose Route Frequency Provider Last Rate Last Admin   0.9 %  sodium chloride infusion  250 mL Intravenous PRN Michael Boston, MD       acetaminophen (TYLENOL) tablet 500 mg  500 mg Oral Q6H Michael Boston, MD   500 mg at 03/25/22 1708   albuterol (PROVENTIL) (2.5 MG/3ML) 0.083% nebulizer solution 2.5 mg  2.5 mg Nebulization Q6H PRN Michael Boston, MD       alum & mag hydroxide-simeth (MAALOX/MYLANTA) 200-200-20 MG/5ML suspension 30 mL  30 mL Oral Q6H PRN Norm Parcel, PA-C       amLODipine (NORVASC) tablet 5 mg  5 mg Oral Daily Michael Boston, MD   5 mg at 03/25/22 1010   ascorbic acid (VITAMIN C) tablet 500 mg  500 mg Oral Daily Michael Boston, MD   500 mg at 03/25/22 1008   atorvastatin (LIPITOR) tablet 20 mg  20 mg Oral Daily Michael Boston, MD   20 mg at 03/25/22 1009   diphenhydrAMINE (BENADRYL) 12.5 MG/5ML elixir 12.5 mg  12.5 mg Oral Q6H PRN Norm Parcel, PA-C       Or   diphenhydrAMINE (BENADRYL) injection 12.5 mg  12.5 mg Intravenous Q6H PRN Barkley Boards R, PA-C       enoxaparin (LOVENOX) injection 40 mg  40 mg Subcutaneous Q24H Norm Parcel, PA-C   40 mg at 03/25/22 3664    feeding supplement (ENSURE SURGERY) liquid 237 mL  237 mL Oral BID BM Norm Parcel, PA-C   237 mL at 03/25/22 1516   ferric gluconate (FERRLECIT) 125 mg in sodium chloride 0.9 % 100 mL IVPB  125 mg Intravenous Once Michael Boston, MD 110 mL/hr at 03/26/22 0809 125 mg at 03/26/22 0809   ferrous sulfate tablet 325 mg  325 mg Oral BID WC Michael Boston, MD   325 mg at 03/25/22 1708   gabapentin (NEURONTIN) capsule 200 mg  200 mg  Oral TID Michael Boston, MD   200 mg at 03/25/22 2135   hydrALAZINE (APRESOLINE) injection 5-10 mg  5-10 mg Intravenous Q4H PRN Michael Boston, MD       HYDROmorphone (DILAUDID) injection 0.5-2 mg  0.5-2 mg Intravenous Q2H PRN Michael Boston, MD       imatinib (GLEEVEC) tablet 400 mg  400 mg Oral Daily Michael Boston, MD   400 mg at 03/25/22 2135   lactated ringers bolus 1,000 mL  1,000 mL Intravenous Once Michael Boston, MD       lip balm (CARMEX) ointment   Topical BID Michael Boston, MD   Given at 03/25/22 2136   loperamide (IMODIUM) capsule 2-4 mg  2-4 mg Oral Q6H PRN Michael Boston, MD       loperamide (IMODIUM) capsule 4 mg  4 mg Oral Once Michael Boston, MD       magic mouthwash  15 mL Oral QID PRN Michael Boston, MD       methocarbamol (ROBAXIN) 1,000 mg in dextrose 5 % 100 mL IVPB  1,000 mg Intravenous Q6H PRN Michael Boston, MD       methocarbamol (ROBAXIN) tablet 1,000 mg  1,000 mg Oral Q6H PRN Michael Boston, MD   1,000 mg at 03/22/22 2128   metoprolol tartrate (LOPRESSOR) injection 5 mg  5 mg Intravenous Q6H PRN Norm Parcel, PA-C       metoprolol tartrate (LOPRESSOR) injection 5 mg  5 mg Intravenous Q6H PRN Michael Boston, MD       multivitamin (PROSIGHT) tablet 1 tablet  1 tablet Oral Daily Michael Boston, MD   1 tablet at 03/25/22 1029   ondansetron (ZOFRAN) injection 4 mg  4 mg Intravenous Q6H PRN Michael Boston, MD       Or   ondansetron (ZOFRAN) 8 mg in sodium chloride 0.9 % 50 mL IVPB  8 mg Intravenous Q6H PRN Michael Boston, MD       ondansetron [facility]) tablet 4  mg  4 mg Oral Q6H PRN Norm Parcel, PA-C       Or   ondansetron [facility]) injection 4 mg  4 mg Intravenous Q6H PRN Norm Parcel, PA-C       oxyCODONE (Oxy IR/ROXICODONE) immediate release tablet 5-10 mg  5-10 mg Oral Q6H PRN Michael Boston, MD   10 mg at 03/24/22 1248   pantoprazole (PROTONIX) EC tablet 40 mg  40 mg Oral Daily Michael Boston, MD   40 mg at 03/25/22 1009   polycarbophil (FIBERCON) tablet 625 mg  625 mg Oral BID Michael Boston, MD   625 mg at 03/25/22 2136   potassium chloride SA (KLOR-CON M) CR tablet 40 mEq  40 mEq Oral BID Michael Boston, MD   40 mEq at 03/25/22 2136   prochlorperazine (COMPAZINE) injection 5-10 mg  5-10 mg Intravenous Q4H PRN Michael Boston, MD       simethicone [facility]) chewable tablet 40 mg  40 mg Oral Q6H PRN Norm Parcel, PA-C       sodium chloride flush (NS) 0.9 % injection 3 mL  3 mL Intravenous Gorden Harms, MD   3 mL at 03/25/22 2200   sodium chloride flush (NS) 0.9 % injection 3 mL  3 mL Intravenous PRN Michael Boston, MD         No Known Allergies  Signed:   Adin Hector, MD, FACS, MASCRS Esophageal, Gastrointestinal & Colorectal Surgery Robotic and Minimally Invasive Surgery  [facility] [address] [place], [place] [address] [phone] Fax [phone] Main  CONTACT INFORMATION:  Weekday (9AM-5PM): Call [facility] main office at [phone]  Weeknight (5PM-9AM) or Weekend/Holiday: Check [email or web address] (password " [email or web address]") for General Surgery [facility] coverage  (Please, do not use SecureChat as it is not reliable communication to reach operating surgeons for immediate patient care given surgeries/outpatient duties/clinic/cross-coverage/off post-call which would lead to a delay in care.  Epic staff messaging available for outptient concerns, but may not be answered for 48 hours or more).     03/26/2022, 8:18 AM

## 2022-03-26 NOTE — Plan of Care (Signed)
Patient is stable for discharge. Discharge instructions have been given. All questions were answered. Patient is being discharged home with wife.

## 2022-03-27 ENCOUNTER — Telehealth: Payer: Self-pay

## 2022-03-27 NOTE — Patient Outreach (Signed)
  Care Coordination [facility] Note Transition Care Management Unsuccessful Follow-up Telephone Call  Date of discharge and from where:  03/26/22-[facility]  Attempts:  1st Attempt  Reason for unsuccessful TCM follow-up call:  No answer/busy    Enzo Montgomery, RN,BSN,CCM [place] Management Telephonic Care Management Coordinator Direct Phone: [phone] Toll Free: [phone] Fax: [phone]

## 2022-03-28 ENCOUNTER — Telehealth: Payer: Self-pay

## 2022-03-28 NOTE — Patient Outreach (Signed)
  Care Coordination TOC Note Transition Care Management Unsuccessful Follow-up Telephone Call  Date of discharge and from where:  03/26/22-[facility]  Attempts:  2nd Attempt  Reason for unsuccessful TCM follow-up call:  Unable to leave message      Enzo Montgomery, RN,BSN,CCM [place] Management Telephonic Care Management Coordinator Direct Phone: [phone] Toll Free: [phone] Fax: [phone]

## 2022-03-29 ENCOUNTER — Telehealth: Payer: Self-pay | Admitting: Physician Assistant

## 2022-03-29 ENCOUNTER — Telehealth: Payer: Self-pay

## 2022-03-29 NOTE — Telephone Encounter (Signed)
Rescheduled 12/21 appointment due to providers request, patient is notified.

## 2022-03-29 NOTE — Patient Outreach (Signed)
  Care Coordination TOC Note Transition Care Management Follow-up Telephone Call Date of discharge and from where: 03/26/22-[facility]   Dx: "malignant GI stromal tumor of rectum" How have you been since you were released from the hospital? Per patient report "pretty rough." Patient voices that he was not told what to expect post surgery. He has "no control of BMs" and having frequent BM. He has talked to Psychologist, sport and exercise. Patient taking Imodium and reports med does help and decrease stools to about 5-6x/day. He is staying hydrated and eating well. He knows that this is part of the process and should improve some in the next few days. Any questions or concerns? No  Items Reviewed: Did the pt receive and understand the discharge instructions provided? Yes  Medications obtained and verified? Yes  Other? Yes  Any new allergies since your discharge? No  Dietary orders reviewed? Yes Do you have support at home? Yes   Home Care and Equipment/Supplies: Were home health services ordered? not applicable If so, what is the name of the agency? N/A  Has the agency set up a time to come to the patient's home? not applicable Were any new equipment or medical supplies ordered?  No What is the name of the medical supply agency? N/A Were you able to get the supplies/equipment? not applicable Do you have any questions related to the use of the equipment or supplies? No  Functional Questionnaire: (I = Independent and D = Dependent) ADLs: I  Bathing/Dressing- I  Meal Prep- A  Eating- I  Maintaining continence- I  Transferring/Ambulation- I  Managing Meds- I  Follow up appointments reviewed:  PCP Hospital f/u appt confirmed?  Patient will call office to make an appt  . [facility] f/u appt confirmed? Yes   -will see surgeon after holidays. Are transportation arrangements needed? No  If their condition worsens, is the pt aware to call PCP or go to the Emergency Dept.? Yes Was the  patient provided with contact information for the PCP's office or ED? Yes Was to pt encouraged to call back with questions or concerns? Yes  SDOH assessments and interventions completed:   Yes SDOH Interventions Today    Flowsheet Row Most Recent Value  SDOH Interventions   Food Insecurity Interventions Intervention Not Indicated  Transportation Interventions Intervention Not Indicated       Care Coordination Interventions:  Education provided    Encounter Outcome:  Pt. Visit Completed    Enzo Montgomery, RN,BSN,CCM [place] Management Telephonic Care Management Coordinator Direct Phone: [phone] Toll Free: [phone] Fax: [phone]

## 2022-03-31 NOTE — Progress Notes (Unsigned)
[facility] OFFICE PROGRESS NOTE  [name], MD [place] [address]  DIAGNOSIS:  1) Stage IIB (T1 a, N1, M0) non-small cell lung cancer, adenocarcinoma diagnosed in June 2021. 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   Biomarker Findings Tumor Mutational Burden - 20 Muts/Mb Microsatellite status - MS-Stable Genomic Findings For a complete list of the genes assayed, please refer to the Appendix. KEAP1 I469f*17 [id number] RB1 splice site [id number]* 8 Disease relevant genes with no reportable alterations: ALK, BRAF, EGFR, ERBB2, KRAS, MET, RET, ROS1   PDL1 Expression: 1 %  PRIOR THERAPY: 1) status post left upper lobectomy with lymph node dissection on December 16, 2019 under the care of Dr. LKipp Brood  The tumor size measured 0.9 cm but there was involvement of the level 10 L and 11 L.  2) Adjuvant systemic chemotherapy with cisplatin 75 mg/M2 and Alimta 500 mg/M2 every 3 weeks.  First dose February 04, 2020.  Status post 4 cycles. 3) Neoadjuvant treatment with imatinib 400 mg p.o. daily for the GIST tumor of the pelvic area.  First dose started July 18, 2020.  Status post 12 months of treatment. 4) status post robotic assisted very low anterior rectosigmoid resection with coloanal anastomosis, diverting loop ileostomy and wedge liver biopsy under the care of Dr. GJohney Maineon December 14, 2021 and it showed minimal residual gastrointestinal stromal tumor.    CURRENT THERAPY: 1)  Imatinib 400 mg p.o. daily    INTERVAL HISTORY: Jesse ATTIG71y.o. male returns to the clinic today for follow-up visit accompanied by his wife.  When the patient was seen by Dr. MJulien Nordmannon 01/22/2022.  At that point time, he had a repeat PET scan which is suspicious for disease recurrence with new hypermetabolic left axillary and mediastinal and left hilar lymphadenopathy.  There is also a non-hypermetabolic mixed cystic and solid 2.2 cm peripheral  right lower lobe pulmonary nodule.  Dr. MJulien Nordmannrecommended that the patient undergo an ultrasound-guided biopsy of the left axillary lymph node which was performed by interventional radiology on 02/20/22. There was no malignancy identities but there was not enough tissue to exclude lymphoma.   The patient also has a history of a pelvic GIST tumor that was resected by Dr. GJohney Mainein August 2023. He is currently on Imatinib which he is tolerating well. He denied having any current chest pain, cough or hemoptysis. He has stable dyspnea on exertion with walking uphill. He has no nausea, vomiting, diarrhea or constipation. The patient underwent open takedown of ileostomy by Dr. GJohney Maineon 12/7. While there, they also did lymph node excission of the left axillar lymph node for further testing which was negative for malignancy.   He has no headache or visual changes. He denies fever, chills, night sweats, or unexplained weight loss except he initially lost weight with his GIST tumor but is gaining weight back.   He is here for evaluation and repeat blood work.   MEDICAL HISTORY: Past Medical History:  Diagnosis Date   Anemia    Cancer ([facility]    bladder cancer   COPD (chronic obstructive pulmonary disease) ([facility]    mild    DIVERTICULOSIS, COLON 01/14/2007   Qualifier: Diagnosis of  By: MPaulina FusiRN, JDaine Gravel   GASTRITIS, CHRONIC 06/01/2004   Qualifier: Diagnosis of  By: SNolon RodCMA (AAMA), Robin     GERD 01/09/2007   Qualifier: Diagnosis of  By: TScherrie Gerlach  Heart murmur    as a child; no murmur heard 12/14/19   HYPERLIPIDEMIA 01/09/2007   Qualifier: Diagnosis of  By: Sherren Mocha RN, Dorian Pod     Hypertension    Lung mass    left upper nodule   Macular degeneration    right eye   NEOPLASM, MALIGNANT, BLADDER, HX OF 2002   Qualifier: Diagnosis of  By: Nolon Rod CMA (AAMA), Robin  / no chemo or radiation   OSTEOARTHRITIS 01/14/2007   Qualifier: Diagnosis of  By: Paulina Fusi, RN, [name] - knees   Rectal  mass    Reflux esophagitis 06/01/2004   Qualifier: Diagnosis of  By: Nolon Rod CMA (AAMA), Robin     TOBACCO USER 02/16/2009   Qualifier: Diagnosis of  By: Burnice Logan  MD, Doretha Sou    VITAMIN D DEFICIENCY 06/03/2007   Qualifier: Diagnosis of  By: Burnice Logan  MD, Doretha Sou     ALLERGIES:  has No Known Allergies.  MEDICATIONS:  Current Outpatient Medications  Medication Sig Dispense Refill   acetaminophen (TYLENOL) 500 MG tablet Take 1,000 mg by mouth every 8 (eight) hours as needed for moderate pain.     amLODipine (NORVASC) 5 MG tablet Take 1 tablet (5 mg total) by mouth daily. 90 tablet 3   atorvastatin (LIPITOR) 40 MG tablet Take 1/2 tablet (20 mg total) by mouth daily. 90 tablet 3   Cholecalciferol (VITAMIN D3 PO) Take 1 capsule by mouth daily.     ferrous sulfate 325 (65 FE) MG EC tablet Take 1 tablet (325 mg total) by mouth 2 (two) times daily. 60 tablet 1   imatinib (GLEEVEC) 400 MG tablet Take 1 tablet (400 mg total) by mouth daily. Take with meals and large glass of water.Caution:Chemotherapy. (Patient taking differently: Take 400 mg by mouth at bedtime. Take with meals and large glass of water.Caution:Chemotherapy.) 30 tablet 3   Multiple Vitamins-Minerals (PRESERVISION AREDS 2) CAPS Take 1 capsule by mouth 2 (two) times daily.     omeprazole (PRILOSEC) 40 MG capsule Take 1 capsule (40 mg total) by mouth daily. 90 capsule 3   OVER THE COUNTER MEDICATION Take 3 tablets by mouth daily.  Balance of Nature Fruits     OVER THE COUNTER MEDICATION Take 3 tablets by mouth daily. Balance of Nature Vegetable     oxyCODONE (OXY IR/ROXICODONE) 5 MG immediate release tablet Take 1 tablet (5 mg total) by mouth every 6 (six) hours as needed for severe pain or breakthrough pain. 20 tablet 0   UNABLE TO FIND Take by mouth daily. Med Name: Balance of Petra Kuba     No current facility-administered medications for this visit.    SURGICAL HISTORY:  Past Surgical History:  Procedure Laterality Date    BREAST BIOPSY Left 03/16/2022   [place] LT RADIOACTIVE SEED LOC 03/16/2022 [facility] MAMMOGRAPHY   BRONCHIAL BIOPSY  10/13/2019   Procedure: BRONCHIAL BIOPSIES;  Surgeon: Garner Nash, DO;  Location: [facility];  Service: Pulmonary;;   BRONCHIAL BRUSHINGS  10/13/2019   Procedure: BRONCHIAL BRUSHINGS;  Surgeon: Garner Nash, DO;  Location: [facility];  Service: Pulmonary;;   BRONCHIAL NEEDLE ASPIRATION BIOPSY  10/13/2019   Procedure: BRONCHIAL NEEDLE ASPIRATION BIOPSIES;  Surgeon: Garner Nash, DO;  Location: [facility];  Service: Pulmonary;;   BRONCHIAL WASHINGS  10/13/2019   Procedure: BRONCHIAL WASHINGS;  Surgeon: Garner Nash, DO;  Location: [facility];  Service: Pulmonary;;   COLONOSCOPY     multiple   DIVERTING ILEOSTOMY N/A 12/14/2021   Procedure: DIVERTING  ILEOSTOMY;  Surgeon: Michael Boston, MD;  Location: [facility];  Service: General;  Laterality: N/A;   ILEOSTOMY CLOSURE N/A 03/22/2022   Procedure: OPEN TAKEDOWN OF LOOP ILEOSTOMY;  Surgeon: Michael Boston, MD;  Location: [facility];  Service: General;  Laterality: N/A;  GEN w/ERAS PATHWAY LOCAL   INTERCOSTAL NERVE BLOCK Left 12/16/2019   Procedure: INTERCOSTAL NERVE BLOCK;  Surgeon: Lajuana Matte, MD;  Location: [facility];  Service: Thoracic;  Laterality: Left;   KNEE SURGERY  07/20/2008   bilat.   NASAL SEPTUM SURGERY  1995   NODE DISSECTION Left 12/16/2019   Procedure: NODE DISSECTION;  Surgeon: Lajuana Matte, MD;  Location: [facility];  Service: Thoracic;  Laterality: Left;   RADIOACTIVE SEED GUIDED AXILLARY SENTINEL LYMPH NODE Left 03/20/2022   Procedure: RADIOACTIVE SEED GUIDED LEFT AXILLARY SENTINEL LYMPH NODE BIOPSY;  Surgeon: Erroll Luna, MD;  Location: [facility];  Service: General;  Laterality: Left;   RECTAL EXAM UNDER ANESTHESIA N/A 03/22/2022   Procedure: ANORECTAL EXAMINATION UNDER ANESTHESIA;  Surgeon: Michael Boston, MD;  [facility];  Service: General;  Laterality: N/A;   TONSILLECTOMY     TRANSURETHRAL  RESECTION OF BLADDER TUMOR  2002   UPPER GASTROINTESTINAL ENDOSCOPY     VIDEO BRONCHOSCOPY WITH ENDOBRONCHIAL NAVIGATION N/A 10/13/2019   Procedure: VIDEO BRONCHOSCOPY WITH ENDOBRONCHIAL NAVIGATION;  Surgeon: Garner Nash, DO;  Location: [facility];  Service: Pulmonary;  Laterality: N/A;   WISDOM TOOTH EXTRACTION     XI ROBOTIC ASSISTED LOWER ANTERIOR RESECTION N/A 12/14/2021   Procedure: XI ROBOTIC ASSISTED LOWER ANTERIOR ULTRA LOW RECTOSIGMOID RESECTION, COLOANAL HAND SEWN ANASTOMOSIS, AND BILATERAL TAP BLOCK;  Surgeon: Michael Boston, MD;  Location: [facility];  Service: General;  Laterality: N/A;    REVIEW OF SYSTEMS:   Review of Systems  Constitutional: Negative for appetite change, chills, fatigue, fever and unexpected weight change.  HENT:   Negative for mouth sores, nosebleeds, sore throat and trouble swallowing.   Eyes: Negative for eye problems and icterus.  Respiratory: Negative for cough, hemoptysis, shortness of breath and wheezing.   Cardiovascular: Negative for chest pain and leg swelling.  Gastrointestinal: Negative for abdominal pain, constipation, diarrhea, nausea and vomiting.  Genitourinary: Negative for bladder incontinence, difficulty urinating, dysuria, frequency and hematuria.   Musculoskeletal: Negative for back pain, gait problem, neck pain and neck stiffness.  Skin: Negative for itching and rash.  Neurological: Negative for dizziness, extremity weakness, gait problem, headaches, light-headedness and seizures.  Hematological: Negative for adenopathy. Does not bruise/bleed easily.  Psychiatric/Behavioral: Negative for confusion, depression and sleep disturbance. The patient is not nervous/anxious.     PHYSICAL EXAMINATION:  There were no vitals taken for this visit.  ECOG PERFORMANCE STATUS: {CHL ONC ECOG [id number]  Physical Exam  Constitutional: Oriented to person, place, and time and well-developed, well-nourished, and in no distress. No distress.  HENT:   Head: Normocephalic and atraumatic.  Mouth/Throat: Oropharynx is clear and moist. No oropharyngeal exudate.  Eyes: Conjunctivae are normal. Right eye exhibits no discharge. Left eye exhibits no discharge. No scleral icterus.  Neck: Normal range of motion. Neck supple.  Cardiovascular: Normal rate, regular rhythm, normal heart sounds and intact distal pulses.   Pulmonary/Chest: Effort normal and breath sounds normal. No respiratory distress. No wheezes. No rales.  Abdominal: Soft. Bowel sounds are normal. Exhibits no distension and no mass. There is no tenderness.  Musculoskeletal: Normal range of motion. Exhibits no edema.  Lymphadenopathy:    No cervical adenopathy.  Neurological: Alert and oriented  to person, place, and time. Exhibits normal muscle tone. Gait normal. Coordination normal.  Skin: Skin is warm and dry. No rash noted. Not diaphoretic. No erythema. No pallor.  Psychiatric: Mood, memory and judgment normal.  Vitals reviewed.  LABORATORY DATA: Lab Results  Component Value Date   WBC 4.5 03/26/2022   HGB 7.8 (L) 03/26/2022   HCT 23.7 (L) 03/26/2022   MCV 98.8 03/26/2022   PLT 116 (L) 03/26/2022      Chemistry      Component Value Date/Time   NA 132 (L) 03/23/2022 0727   K 4.2 03/26/2022 0405   CL 98 03/23/2022 0727   CO2 25 03/23/2022 0727   BUN 16 03/23/2022 0727   CREATININE 0.74 03/26/2022 0405   CREATININE 1.12 01/19/2022 0931      Component Value Date/Time   CALCIUM 8.6 (L) 03/23/2022 0727   ALKPHOS 86 03/20/2022 0926   AST 23 03/20/2022 0926   AST 16 01/19/2022 0931   ALT 18 03/20/2022 0926   ALT 11 01/19/2022 0931   BILITOT 0.6 03/20/2022 0926   BILITOT 0.6 01/19/2022 0931       RADIOGRAPHIC STUDIES:  MM Breast Surgical Specimen  Result Date: 03/20/2022 CLINICAL DATA:  Specimen radiograph status post left axillary lymph node excision. EXAM: SPECIMEN RADIOGRAPH OF THE LEFT BREAST COMPARISON:  Previous exam(s). FINDINGS: Status post excision of  the left axilla. The radioactive seed is seen within its own specimen container, separate from a second specimen container with the tissue specimen. This was communicated with the OR at 11:55 a.m. IMPRESSION: Specimen radiograph of the left axilla. Electronically Signed   By: Ammie Ferrier M.D.   On: 03/20/2022 11:57  [place] LT RADIOACTIVE SEED LOC  Result Date: 03/16/2022 CLINICAL DATA:  71 year old male presenting for seed localization of a left axillary lymph node. Patient has indeterminate left axillary adenopathy. EXAM: ULTRASOUND GUIDED RADIOACTIVE SEED LOCALIZATION OF THE LEFT AXILLA COMPARISON:  Previous exam(s). FINDINGS: Patient presents for radioactive seed localization prior to left axillary dissection. I met with the patient and we discussed the procedure of seed localization including benefits and alternatives. We discussed the high likelihood of a successful procedure. We discussed the risks of the procedure including infection, bleeding, tissue injury and further surgery. We discussed the low dose of radioactivity involved in the procedure. Informed, written consent was given. The usual time-out protocol was performed immediately prior to the procedure. Using ultrasound guidance, sterile technique, 1% lidocaine and an I-125 radioactive seed, and enlarged left axillary lymph node was localized using a lateral approach. Follow-up survey of the patient confirms presence of the radioactive seed. Order number of I-125 seed:  [id number]. Total activity: 0.259 mCi reference Date: February 01, 2022 The patient tolerated the procedure well and was released from the [facility]. She was given instructions regarding seed removal. IMPRESSION: Radioactive seed localization left axilla. No apparent complications. Electronically Signed   By: Audie Pinto M.D.   On: 03/16/2022 15:14    ASSESSMENT/PLAN:  This very pleasant 71 year old Caucasian male diagnosed with stage Iib (T1 a, N1, M0) non-small cell  lung cancer, adenocarcinoma.  He was diagnosed initially in June 2021.  He has no actionable mutations and his PD-L1 expression was 1%.   The patient is status post a left upper lobectomy with lymph node dissection on 12/16/2019 under the care of Dr. Kipp Brood.   He also was diagnosed with a pelvic GIST tumor.  For the lung malignancy, the patient completed a course of adjuvant systemic  chemotherapy with cisplatin 75 mg per metered squared and Alimta 500 mg per metered squared IV every 3 weeks.  He is status post 4 cycles.   For the GIST tumor, the patient completed neoadjuvant treatment with imatinib 400 mg p.o. daily in April 2023 status post 18 months of treatment.He is on adjuvant imatinib   Recently, the patient had a repeat CT scan the chest that showed increased soft tissue density in the left hilar region concerning for developing lymphadenopathy or disease recurrence. The patient subsequently had a PET scan that showed new hypermetabolic left axillary, mediastinal, and left hilar adenopathy compatible with disease recurrence   He underwent a ultrasound-guided biopsy of the left axillary lymph node on 02/20/2022. From discussing with the pathologist, the final report is not finalized, but he did not see evidence of GIST or lung cancer. He is still working on stains for lymphoma, but would need additional tissue for testing.   Therefore, the patient was scheduled for takedown of loop ileostomy on 03/22/22 by Dr. Johney Maine. Lymph node excision was performed of the left axillary lymph node. The pathology was negative for malignancy.   The patient was seen with Dr. Julien Nordmann today. Still can't exclude disease recurrent. Dr. Julien Nordmann recommends referral to Dr. Valeta Harms for consideration of bronchoscopy and biopsy.   Follow up ***  Gleevec  The patient was advised to call immediately if she has any concerning symptoms in the interval. The patient voices understanding of current disease status and  treatment options and is in agreement with the current care plan. All questions were answered. The patient knows to call the clinic with any problems, questions or concerns. We can certainly see the patient much sooner if necessary        No orders of the defined types were placed in this encounter.    I spent {CHL ONC TIME VISIT - TRZNB:[id number]} counseling the patient face to face. The total time spent in the appointment was {CHL ONC TIME VISIT - DTHYH:[id number]}.  Fatim Vanderschaaf L Augie Vane, PA-C 03/31/22

## 2022-04-03 ENCOUNTER — Encounter: Payer: Self-pay | Admitting: Physician Assistant

## 2022-04-03 ENCOUNTER — Inpatient Hospital Stay: Payer: Medicare Other

## 2022-04-03 ENCOUNTER — Inpatient Hospital Stay (HOSPITAL_BASED_OUTPATIENT_CLINIC_OR_DEPARTMENT_OTHER): Payer: Medicare Other | Admitting: Physician Assistant

## 2022-04-03 ENCOUNTER — Inpatient Hospital Stay: Payer: Medicare Other | Attending: Internal Medicine

## 2022-04-03 VITALS — BP 141/75 | HR 77 | Temp 97.7°F | Resp 14 | Wt 152.1 lb

## 2022-04-03 DIAGNOSIS — I10 Essential (primary) hypertension: Secondary | ICD-10-CM | POA: Diagnosis not present

## 2022-04-03 DIAGNOSIS — Z8551 Personal history of malignant neoplasm of bladder: Secondary | ICD-10-CM | POA: Diagnosis not present

## 2022-04-03 DIAGNOSIS — C3492 Malignant neoplasm of unspecified part of left bronchus or lung: Secondary | ICD-10-CM | POA: Diagnosis not present

## 2022-04-03 DIAGNOSIS — D509 Iron deficiency anemia, unspecified: Secondary | ICD-10-CM | POA: Diagnosis not present

## 2022-04-03 DIAGNOSIS — Z79899 Other long term (current) drug therapy: Secondary | ICD-10-CM | POA: Diagnosis not present

## 2022-04-03 DIAGNOSIS — Z85118 Personal history of other malignant neoplasm of bronchus and lung: Secondary | ICD-10-CM | POA: Diagnosis not present

## 2022-04-03 DIAGNOSIS — C49A9 Gastrointestinal stromal tumor of other sites: Secondary | ICD-10-CM | POA: Insufficient documentation

## 2022-04-03 DIAGNOSIS — Z9221 Personal history of antineoplastic chemotherapy: Secondary | ICD-10-CM | POA: Insufficient documentation

## 2022-04-03 LAB — CBC WITH DIFFERENTIAL (CANCER CENTER ONLY)
Abs Immature Granulocytes: 0.02 10*3/uL (ref 0.00–0.07)
Basophils Absolute: 0 10*3/uL (ref 0.0–0.1)
Basophils Relative: 1 %
Eosinophils Absolute: 0.1 10*3/uL (ref 0.0–0.5)
Eosinophils Relative: 2 %
HCT: 29.5 % — ABNORMAL LOW (ref 39.0–52.0)
Hemoglobin: 9.9 g/dL — ABNORMAL LOW (ref 13.0–17.0)
Immature Granulocytes: 0 %
Lymphocytes Relative: 20 %
Lymphs Abs: 1.1 10*3/uL (ref 0.7–4.0)
MCH: 33.1 pg (ref 26.0–34.0)
MCHC: 33.6 g/dL (ref 30.0–36.0)
MCV: 98.7 fL (ref 80.0–100.0)
Monocytes Absolute: 0.3 10*3/uL (ref 0.1–1.0)
Monocytes Relative: 5 %
Neutro Abs: 3.9 10*3/uL (ref 1.7–7.7)
Neutrophils Relative %: 72 %
Platelet Count: 240 10*3/uL (ref 150–400)
RBC: 2.99 MIL/uL — ABNORMAL LOW (ref 4.22–5.81)
RDW: 14.6 % (ref 11.5–15.5)
WBC Count: 5.4 10*3/uL (ref 4.0–10.5)
nRBC: 0 % (ref 0.0–0.2)

## 2022-04-03 LAB — CMP (CANCER CENTER ONLY)
ALT: 15 U/L (ref 0–44)
AST: 17 U/L (ref 15–41)
Albumin: 3.5 g/dL (ref 3.5–5.0)
Alkaline Phosphatase: 69 U/L (ref 38–126)
Anion gap: 8 (ref 5–15)
BUN: 17 mg/dL (ref 8–23)
CO2: 26 mmol/L (ref 22–32)
Calcium: 8.9 mg/dL (ref 8.9–10.3)
Chloride: 108 mmol/L (ref 98–111)
Creatinine: 0.81 mg/dL (ref 0.61–1.24)
GFR, Estimated: >60 mL/min (ref >60)
Glucose, Bld: 113 mg/dL — ABNORMAL HIGH (ref 70–99)
Potassium: 4.2 mmol/L (ref 3.5–5.1)
Sodium: 142 mmol/L (ref 135–145)
Total Bilirubin: 0.6 mg/dL (ref 0.3–1.2)
Total Protein: 6.2 g/dL — ABNORMAL LOW (ref 6.5–8.1)

## 2022-04-03 LAB — IRON AND IRON BINDING CAPACITY (CC-WL,HP ONLY)
Iron: 75 ug/dL (ref 45–182)
Saturation Ratios: 24 % (ref 17.9–39.5)
TIBC: 315 ug/dL (ref 250–450)
UIBC: 240 ug/dL (ref 117–376)

## 2022-04-03 LAB — FERRITIN: Ferritin: 276 ng/mL (ref 24–336)

## 2022-04-04 ENCOUNTER — Telehealth: Payer: Self-pay

## 2022-04-04 NOTE — Telephone Encounter (Signed)
ATC LMTCB As per Dr Valeta Harms: please get Mr. Schindel in to see me on Friday if possible? Thanks  BI: I bx him before. he looks like he has recurrence.

## 2022-04-05 ENCOUNTER — Ambulatory Visit: Payer: Medicare Other | Admitting: Physician Assistant

## 2022-04-05 ENCOUNTER — Other Ambulatory Visit: Payer: Medicare Other

## 2022-04-06 ENCOUNTER — Encounter: Payer: Self-pay | Admitting: Pulmonary Disease

## 2022-04-06 ENCOUNTER — Ambulatory Visit: Payer: Medicare Other | Admitting: Pulmonary Disease

## 2022-04-06 VITALS — BP 110/60 | HR 75 | Temp 98.0°F | Ht 69.0 in | Wt 153.6 lb

## 2022-04-06 DIAGNOSIS — R942 Abnormal results of pulmonary function studies: Secondary | ICD-10-CM

## 2022-04-06 DIAGNOSIS — R599 Enlarged lymph nodes, unspecified: Secondary | ICD-10-CM

## 2022-04-06 DIAGNOSIS — C3492 Malignant neoplasm of unspecified part of left bronchus or lung: Secondary | ICD-10-CM

## 2022-04-06 NOTE — Progress Notes (Signed)
Synopsis: Referred in 02/15/2020 for history of lung cancer by Heilingoetter, Cassandr*  Subjective:   PATIENT ID: Jesse [name] GENDER: male DOB: 01/22/1951, MRN: [id number]  Chief Complaint  Patient presents with   Consult    Lung nodule    This is a 71 year old gentleman with a past medical history of hyperlipidemia, gastroesophageal reflux bladder cancer. He was diagnosed with a rectal mass. During this work-up he was found to have a left upper lobe 7 mm pulmonary nodule. Patient underwent navigational bronchoscopy for evaluation of this lesion which was determined to be TTF-1 positive adenocarcinoma. Pathology results reviewed. Patient was referred to cardiothoracic surgery for robotic lobectomy. Patient underwent robotic lobectomy on 12/16/2019 by Dr. Kipp Brood. 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 There was involvement of level 10 L 11 L lymph nodes on surgical specimen. Therefore decision was made for adjuvant systemic chemotherapy to include cisplatin plus Alimta. Patient was initially followed in our lung cancer screening program. He is here today for new consultation and establish care with primary pulmonologist. I did his procedure as a direct referral for work-up a few months prior.  02/15/2020: Today, today patient doing very well recently started treatments with chemotherapy for adjuvant therapy.  Overall has no respiratory complaints.  He does feel like his chest has healed up well.  He has not had follow-up CT imaging yet.  OV 04/06/2022: Here today for follow-up.  He has been undergoing treatments with Dr. Earlie Server.  Was diagnosed with a stage IIb non-small cell lung cancer.I initially saw him in 2021.  He had a repeat staging non-small cell lung cancer PET scan which was completed in October.  This revealed new hypermetabolic left axillary mediastinal and left hilar adenopathy concerning  for metastatic disease.  Patient underwent a ultrasound-guided core needle biopsy of the lymph nodes this was completed in the left axilla.  This was done in November 2023.  Pathology from the core needle biopsy was lymphoid tissue with histiocytosis and rare large atypical cells.  After review of the patient's images and pathology oncology felt like this is still concern for recurrence of lung cancer.  Therefore would like to have a repeat bronchoscopy and biopsy of the mediastinal nodes    Past Medical History:  Diagnosis Date   Anemia    Cancer ([place])    bladder cancer   COPD (chronic obstructive pulmonary disease) ([place])    mild    DIVERTICULOSIS, COLON 01/14/2007   Qualifier: Diagnosis of  By: Paulina Fusi RN, [name]    GASTRITIS, CHRONIC 06/01/2004   Qualifier: Diagnosis of  By: Nolon Rod CMA ([place]), Robin     GERD 01/09/2007   Qualifier: Diagnosis of  By: Sherren Mocha, RN, Ellen     Heart murmur    as a child; no murmur heard 12/14/19   HYPERLIPIDEMIA 01/09/2007   Qualifier: Diagnosis of  By: Sherren Mocha RN, Dorian Pod     Hypertension    Lung mass    left upper nodule   Macular degeneration    right eye   NEOPLASM, MALIGNANT, BLADDER, HX OF 2002   Qualifier: Diagnosis of  By: Nolon Rod CMA (AAMA), Robin  / no chemo or radiation   OSTEOARTHRITIS 01/14/2007   Qualifier: Diagnosis of  By: Paulina Fusi, RN, [name] - knees   Rectal mass    Reflux esophagitis 06/01/2004   Qualifier: Diagnosis of  By: Nolon Rod CMA (AAMA), Robin     TOBACCO  USER 02/16/2009   Qualifier: Diagnosis of  By: Burnice Logan  MD, Doretha Sou    VITAMIN D DEFICIENCY 06/03/2007   Qualifier: Diagnosis of  By: Burnice Logan  MD, Doretha Sou      Family History  Problem Relation Age of Onset   Dementia Mother    Diabetes Father    Mental illness Father    Peripheral vascular disease Brother      Past Surgical History:  Procedure Laterality Date   BREAST BIOPSY Left 03/16/2022   [place] LT RADIOACTIVE SEED LOC 03/16/2022 [facility]    BRONCHIAL BIOPSY  10/13/2019   Procedure: BRONCHIAL BIOPSIES;  Surgeon: Garner Nash, DO;  Location: [facility];  Service: Pulmonary;;   BRONCHIAL BRUSHINGS  10/13/2019   Procedure: BRONCHIAL BRUSHINGS;  Surgeon: Garner Nash, DO;  Location: [facility];  Service: Pulmonary;;   BRONCHIAL NEEDLE ASPIRATION BIOPSY  10/13/2019   Procedure: BRONCHIAL NEEDLE ASPIRATION BIOPSIES;  Surgeon: Garner Nash, DO;  Location: [facility];  Service: Pulmonary;;   BRONCHIAL WASHINGS  10/13/2019   Procedure: BRONCHIAL WASHINGS;  Surgeon: Garner Nash, DO;  Location: [facility];  Service: Pulmonary;;   COLONOSCOPY     multiple   DIVERTING ILEOSTOMY N/A 12/14/2021   Procedure: DIVERTING ILEOSTOMY;  Surgeon: Michael Boston, MD;  Location: [facility];  Service: General;  Laterality: N/A;   ILEOSTOMY CLOSURE N/A 03/22/2022   Procedure: OPEN TAKEDOWN OF LOOP ILEOSTOMY;  Surgeon: Michael Boston, MD;  Location: [facility];  Service: General;  Laterality: N/A;  GEN w/ERAS PATHWAY LOCAL   INTERCOSTAL NERVE BLOCK Left 12/16/2019   Procedure: INTERCOSTAL NERVE BLOCK;  Surgeon: Lajuana Matte, MD;  Location: [facility];  Service: Thoracic;  Laterality: Left;   KNEE SURGERY  07/20/2008   bilat.   NASAL SEPTUM SURGERY  1995   NODE DISSECTION Left 12/16/2019   Procedure: NODE DISSECTION;  Surgeon: Lajuana Matte, MD;  Location: [facility];  Service: Thoracic;  Laterality: Left;   RADIOACTIVE SEED GUIDED AXILLARY SENTINEL LYMPH NODE Left 03/20/2022   Procedure: RADIOACTIVE SEED GUIDED LEFT AXILLARY SENTINEL LYMPH NODE BIOPSY;  Surgeon: Erroll Luna, MD;  Location: [facility];  Service: General;  Laterality: Left;   RECTAL EXAM UNDER ANESTHESIA N/A 03/22/2022   Procedure: ANORECTAL EXAMINATION UNDER ANESTHESIA;  Surgeon: Michael Boston, MD;  Location[facility];  Service: General;  Laterality: N/A;   TONSILLECTOMY     TRANSURETHRAL RESECTION OF BLADDER TUMOR  2002   UPPER GASTROINTESTINAL ENDOSCOPY     VIDEO BRONCHOSCOPY WITH  ENDOBRONCHIAL NAVIGATION N/A 10/13/2019   Procedure: VIDEO BRONCHOSCOPY WITH ENDOBRONCHIAL NAVIGATION;  Surgeon: Garner Nash, DO;  Location: [facility];  Service: Pulmonary;  Laterality: N/A;   WISDOM TOOTH EXTRACTION     XI ROBOTIC ASSISTED LOWER ANTERIOR RESECTION N/A 12/14/2021   Procedure: XI ROBOTIC ASSISTED LOWER ANTERIOR ULTRA LOW RECTOSIGMOID RESECTION, COLOANAL HAND SEWN ANASTOMOSIS, AND BILATERAL TAP BLOCK;  Surgeon: Michael Boston, MD;  Location[facility];  Service: General;  Laterality: N/A;    Social History   Socioeconomic History   Marital status: Married    Spouse name: Not on file   Number of children: 3   Years of education: Not on file   Highest education level: Not on file  Occupational History    Comment: semi retired   Tobacco Use   Smoking status: Former    Packs/day: 0.50    Years: 40.00    Total pack years: 20.00    Types: Cigarettes    Quit  date: 09/21/2019    Years since quitting: 2.5   Smokeless tobacco: Never  Vaping Use   Vaping Use: Never used  Substance and Sexual Activity   Alcohol use: Yes    Alcohol/week: 15.0 standard drinks of alcohol    Types: 15 Standard drinks or equivalent per week    Comment: wine/beer/martini   Drug use: Not Currently    Types: Marijuana    Comment: Smokes marijuana occasional, last use in 08/2019   Sexual activity: Not Currently  Other Topics Concern   Not on file  Social History Narrative   Married   3 children: 3 grandchildren; 3 great grandchildren   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   Enjoys yard work and [place] Strain: [place]  (07/18/2021)   Overall Financial Resource Strain (CARDIA)    Difficulty of Paying Living Expenses: Not hard at [place]: No [place] (03/29/2022)   Hunger Vital Sign    Worried About Running Out of Food in the Last Year: Never true    [place] in the Last Year: Never true  Transportation  Needs: No Transportation Needs (03/29/2022)   PRAPARE - Hydrologist (Medical): No    Lack of Transportation (Non-Medical): No  Physical Activity: Sufficiently Active (07/18/2021)   Exercise Vital Sign    Days of Exercise per Week: 7 days    Minutes of Exercise per Session: 60 min  Stress: No Stress Concern Present (07/18/2021)   [place]    Feeling of Stress : Not at all  Social Connections: Moderately Isolated (07/12/2020)   Social Connection and Isolation Panel [NHANES]    Frequency of Communication with Friends and Family: More than three times a week    Frequency of Social Gatherings with Friends and Family: More than three times a week    Attends Religious Services: Never    Marine scientist or Organizations: No    Attends Archivist Meetings: Never    Marital Status: Married  Human resources officer Violence: Not At Risk (03/22/2022)   Humiliation, Afraid, Rape, and Kick questionnaire    Fear of Current or Ex-Partner: No    Emotionally Abused: No    Physically Abused: No    Sexually Abused: No     No Known Allergies   Outpatient Medications Prior to Visit  Medication Sig Dispense Refill   acetaminophen (TYLENOL) 500 MG tablet Take 1,000 mg by mouth every 8 (eight) hours as needed for moderate pain.     amLODipine (NORVASC) 5 MG tablet Take 1 tablet (5 mg total) by mouth daily. 90 tablet 3   atorvastatin (LIPITOR) 40 MG tablet Take 1/2 tablet (20 mg total) by mouth daily. 90 tablet 3   Cholecalciferol (VITAMIN D3 PO) Take 1 capsule by mouth daily.     ferrous sulfate 325 (65 FE) MG EC tablet Take 1 tablet (325 mg total) by mouth 2 (two) times daily. 60 tablet 1   imatinib (GLEEVEC) 400 MG tablet Take 1 tablet (400 mg total) by mouth daily. Take with meals and large glass of water.Caution:Chemotherapy. (Patient taking differently: Take 400 mg by mouth at bedtime. Take with meals  and large glass of water.Caution:Chemotherapy.) 30 tablet 3   Multiple Vitamins-Minerals (PRESERVISION AREDS 2) CAPS Take 1 capsule by mouth 2 (two) times daily.     omeprazole (PRILOSEC)  40 MG capsule Take 1 capsule (40 mg total) by mouth daily. 90 capsule 3   OVER THE COUNTER MEDICATION Take 3 tablets by mouth daily.  Balance of Nature [employer]ruits     OVER THE COUNTER MEDICATION Take 3 tablets by mouth daily. Balance of Nature Vegetable     oxyCODONE (OXY IR/ROXICODONE) 5 MG immediate release tablet Take 1 tablet (5 mg total) by mouth every 6 (six) hours as needed for severe pain or breakthrough pain. 20 tablet 0   UNABLE TO FIND Take by mouth daily. Med Name: Balance of Petra Kuba     No facility-administered medications prior to visit.    Review of Systems  Constitutional:  Negative for chills, fever, malaise/fatigue and weight loss.  HENT:  Negative for hearing loss, sore throat and tinnitus.   Eyes:  Negative for blurred vision and double vision.  Respiratory:  Negative for cough, hemoptysis, sputum production, shortness of breath, wheezing and stridor.   Cardiovascular:  Negative for chest pain, palpitations, orthopnea, leg swelling and PND.  Gastrointestinal:  Negative for abdominal pain, constipation, diarrhea, heartburn, nausea and vomiting.  Genitourinary:  Negative for dysuria, hematuria and urgency.  Musculoskeletal:  Negative for joint pain and myalgias.  Skin:  Negative for itching and rash.  Neurological:  Negative for dizziness, tingling, weakness and headaches.  Endo/Heme/Allergies:  Negative for environmental allergies. Does not bruise/bleed easily.  Psychiatric/Behavioral:  Negative for depression. The patient is not nervous/anxious and does not have insomnia.   All other systems reviewed and are negative.    Objective:  Physical Exam Vitals reviewed.  Constitutional:      General: He is not in acute distress.    Appearance: He is well-developed.     Comments: Thin  gentleman, muscle wasting present  HENT:     Head: Normocephalic and atraumatic.  Eyes:     General: No scleral icterus.    Conjunctiva/sclera: Conjunctivae normal.     Pupils: Pupils are equal, round, and reactive to light.  Neck:     Vascular: No JVD.     Trachea: No tracheal deviation.  Cardiovascular:     Rate and Rhythm: Normal rate and regular rhythm.     Heart sounds: Normal heart sounds. No murmur heard. Pulmonary:     Effort: Pulmonary effort is normal. No tachypnea, accessory muscle usage or respiratory distress.     Breath sounds: No stridor. No wheezing, rhonchi or rales.     Comments: Diminished breath sounds bilaterally no crackles no wheeze Abdominal:     General: Bowel sounds are normal. There is no distension.     Palpations: Abdomen is soft.     Tenderness: There is no abdominal tenderness.  Musculoskeletal:        General: No tenderness.     Cervical back: Neck supple.  Lymphadenopathy:     Cervical: No cervical adenopathy.  Skin:    General: Skin is warm and dry.     Capillary Refill: Capillary refill takes less than 2 seconds.     Findings: No rash.  Neurological:     Mental Status: He is alert and oriented to person, place, and time.  Psychiatric:        Behavior: Behavior normal.      Vitals:   04/06/22 1008  BP: 110/60  Pulse: 75  Temp: 98 F (36.7 C)  SpO2: 100%  Weight: 153 lb 9.6 oz (69.7 kg)  Height: 5\' 9"  (1.753 m)    100% on RA BMI Readings from  Last 3 Encounters:  04/06/22 22.68 kg/m  04/03/22 22.46 kg/m  03/25/22 21.74 kg/m   Wt Readings from Last 3 Encounters:  04/06/22 153 lb 9.6 oz (69.7 kg)  04/03/22 152 lb 1.6 oz (69 kg)  03/25/22 147 lb 3.2 oz (66.8 kg)     CBC    Component Value Date/Time   WBC 5.4 04/03/2022 0847   WBC 4.5 03/26/2022 0405   RBC 2.99 (L) 04/03/2022 0847   HGB 9.9 (L) 04/03/2022 0847   HCT 29.5 (L) 04/03/2022 0847   PLT 240 04/03/2022 0847   MCV 98.7 04/03/2022 0847   MCH 33.1 04/03/2022  0847   MCHC 33.6 04/03/2022 0847   RDW 14.6 04/03/2022 0847   LYMPHSABS 1.1 04/03/2022 0847   MONOABS 0.3 04/03/2022 0847   EOSABS 0.1 04/03/2022 0847   BASOSABS 0.0 04/03/2022 0847    Chest Imaging: 01/29/2020 chest x-ray: No pneumothorax, postoperative changes from lobectomy. The patient's images have been independently reviewed by me.    October 2023: Nuclear medicine pet imaging hypermetabolic mediastinal and hilar adenopathy as well as axillary lymph node that is hypermetabolic concerning for metastatic lung cancer and recurrence. The patient's images have been independently reviewed by me.    Pulmonary Functions Testing Results:    Latest Ref Rng & Units 09/25/2019    2:38 PM  PFT Results  FVC-Pre L 4.84   FVC-Predicted Pre % 113   FVC-Post L 4.92   FVC-Predicted Post % 115   Pre FEV1/FVC % % 64   Post FEV1/FCV % % 68   FEV1-Pre L 3.10   FEV1-Predicted Pre % 99   FEV1-Post L 3.33   DLCO uncorrected ml/min/mmHg 27.02   DLCO UNC% % 107   DLVA Predicted % 95   TLC L 7.12   TLC % Predicted % 104   RV % Predicted % 100     FeNO:   Pathology:   Navigational bronchoscopy pathology: Positive TTF-1 adenocarcinoma Robotic lobectomy, left upper lobe, 0.9 cm adenocarcinoma, level 10 L, 11 L lymph node involvement Pathology results reviewed  November 2023 axillary lymph node core needle biopsy: Large atypical cells, histiocytosis lymphocytes.  Echocardiogram:   Heart Catheterization:     Assessment & Plan:     ICD-10-CM   1. Adenocarcinoma of left lung, stage 2 (HCC)  C34.92     2. Abnormal PET scan, lung  R94.2     3. Adenopathy  R59.9       Discussion:  This is a 71 year old gentleman initially seen by me for a stage II adenocarcinoma of the left lung status post left upper lobe ectomy by Dr. Kipp Brood.  Had mild COPD at baseline.  He had some positive lymph nodes and was undergoing adjuvant chemotherapy.  He has been under surveillance imaging with Dr.  Earlie Server and medical oncology.  Plan: After receiving his recent imaging as well as ultrasound needle biopsy of the axillary node decision was made for referral to see me to discuss repeat bronchoscopy and tissue sampling. Today in the office we talked about the risk benefits alternatives of proceeding with videobronchoscopy endobronchial ultrasound and transbronchial needle aspiration of the mediastinal adenopathy that was seen on his PET scan. Patient is agreeable to proceed. Tentative bronchoscopy date will be sometime next week hopefully at [facility].    Current Outpatient Medications:    acetaminophen (TYLENOL) 500 MG tablet, Take 1,000 mg by mouth every 8 (eight) hours as needed for moderate pain., Disp: , Rfl:  amLODipine (NORVASC) 5 MG tablet, Take 1 tablet (5 mg total) by mouth daily., Disp: 90 tablet, Rfl: 3   atorvastatin (LIPITOR) 40 MG tablet, Take 1/2 tablet (20 mg total) by mouth daily., Disp: 90 tablet, Rfl: 3   Cholecalciferol (VITAMIN D3 PO), Take 1 capsule by mouth daily., Disp: , Rfl:    ferrous sulfate 325 (65 FE) MG EC tablet, Take 1 tablet (325 mg total) by mouth 2 (two) times daily., Disp: 60 tablet, Rfl: 1   imatinib (GLEEVEC) 400 MG tablet, Take 1 tablet (400 mg total) by mouth daily. Take with meals and large glass of water.Caution:Chemotherapy. (Patient taking differently: Take 400 mg by mouth at bedtime. Take with meals and large glass of water.Caution:Chemotherapy.), Disp: 30 tablet, Rfl: 3   Multiple Vitamins-Minerals (PRESERVISION AREDS 2) CAPS, Take 1 capsule by mouth 2 (two) times daily., Disp: , Rfl:    omeprazole (PRILOSEC) 40 MG capsule, Take 1 capsule (40 mg total) by mouth daily., Disp: 90 capsule, Rfl: 3   OVER THE COUNTER MEDICATION, Take 3 tablets by mouth daily.  Balance of [employer], Disp: , Rfl:    OVER THE COUNTER MEDICATION, Take 3 tablets by mouth daily. Balance of [employer], Disp: , Rfl:    oxyCODONE (OXY IR/ROXICODONE) 5  MG immediate release tablet, Take 1 tablet (5 mg total) by mouth every 6 (six) hours as needed for severe pain or breakthrough pain., Disp: 20 tablet, Rfl: 0   UNABLE TO FIND, Take by mouth daily. Med Name: Balance of Petra Kuba, Disp: , Rfl:   I spent 42 minutes dedicated to the care of this patient on the date of this encounter to include pre-visit review of records, face-to-face time with the patient discussing conditions above, post visit ordering of testing, clinical documentation with the electronic health record, making appropriate referrals as documented, and communicating necessary findings to members of the patients care team.    Garner Nash, DO [facility] 04/06/2022 10:23 AM

## 2022-04-06 NOTE — Patient Instructions (Signed)
Thank you for visiting Dr. Valeta Harms at [facility]. Today we recommend the following:  Orders Placed This Encounter  Procedures   Procedural/ Surgical Case Request: ENDOBRONCHIAL ULTRASOUND   Ambulatory referral to Pulmonology    Return in about 2 weeks (around 04/20/2022) for w/ Dr. Julien Nordmann .    Please do your part to reduce the spread of COVID-19.

## 2022-04-06 NOTE — H&P (View-Only) (Signed)
Synopsis: Referred in 02/15/2020 for history of lung cancer by Heilingoetter, Cassandr*  Subjective:   PATIENT ID: Jesse [name] GENDER: male DOB: Jan 24, 1951, MRN: [id number]  Chief Complaint  Patient presents with   Consult    Lung nodule    This is a 71 year old gentleman with a past medical history of hyperlipidemia, gastroesophageal reflux bladder cancer. He was diagnosed with a rectal mass. During this work-up he was found to have a left upper lobe 7 mm pulmonary nodule. Patient underwent navigational bronchoscopy for evaluation of this lesion which was determined to be TTF-1 positive adenocarcinoma. Pathology results reviewed. Patient was referred to cardiothoracic surgery for robotic lobectomy. Patient underwent robotic lobectomy on 12/16/2019 by Dr. Kipp Brood. 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 There was involvement of level 10 L 11 L lymph nodes on surgical specimen. Therefore decision was made for adjuvant systemic chemotherapy to include cisplatin plus Alimta. Patient was initially followed in our lung cancer screening program. He is here today for new consultation and establish care with primary pulmonologist. I did his procedure as a direct referral for work-up a few months prior.  02/15/2020: Today, today patient doing very well recently started treatments with chemotherapy for adjuvant therapy.  Overall has no respiratory complaints.  He does feel like his chest has healed up well.  He has not had follow-up CT imaging yet.  OV 04/06/2022: Here today for follow-up.  He has been undergoing treatments with Dr. Earlie Server.  Was diagnosed with a stage IIb non-small cell lung cancer.I initially saw him in 2021.  He had a repeat staging non-small cell lung cancer PET scan which was completed in October.  This revealed new hypermetabolic left axillary mediastinal and left hilar adenopathy concerning  for metastatic disease.  Patient underwent a ultrasound-guided core needle biopsy of the lymph nodes this was completed in the left axilla.  This was done in November 2023.  Pathology from the core needle biopsy was lymphoid tissue with histiocytosis and rare large atypical cells.  After review of the patient's images and pathology oncology felt like this is still concern for recurrence of lung cancer.  Therefore would like to have a repeat bronchoscopy and biopsy of the mediastinal nodes    Past Medical History:  Diagnosis Date   Anemia    Cancer ([place])    bladder cancer   COPD (chronic obstructive pulmonary disease) ([place])    mild    DIVERTICULOSIS, COLON 01/14/2007   Qualifier: Diagnosis of  By: Paulina Fusi RN, [name]    GASTRITIS, CHRONIC 06/01/2004   Qualifier: Diagnosis of  By: Nolon Rod CMA ([place]), Robin     GERD 01/09/2007   Qualifier: Diagnosis of  By: Sherren Mocha, RN, Ellen     Heart murmur    as a child; no murmur heard 12/14/19   HYPERLIPIDEMIA 01/09/2007   Qualifier: Diagnosis of  By: Sherren Mocha RN, Dorian Pod     Hypertension    Lung mass    left upper nodule   Macular degeneration    right eye   NEOPLASM, MALIGNANT, BLADDER, HX OF 2002   Qualifier: Diagnosis of  By: Nolon Rod CMA ([facility]), Robin  / no chemo or radiation   OSTEOARTHRITIS 01/14/2007   Qualifier: Diagnosis of  By: Paulina Fusi, RN, [name] - knees   Rectal mass    Reflux esophagitis 06/01/2004   Qualifier: Diagnosis of  By: Nolon Rod CMA ([facility]AMA), Robin     TOBACCO  USER 02/16/2009   Qualifier: Diagnosis of  By: Burnice Logan  MD, Doretha Sou    VITAMIN D DEFICIENCY 06/03/2007   Qualifier: Diagnosis of  By: Burnice Logan  MD, Doretha Sou      Family History  Problem Relation Age of Onset   Dementia Mother    Diabetes Father    Mental illness Father    Peripheral vascular disease Brother      Past Surgical History:  Procedure Laterality Date   BREAST BIOPSY Left 03/16/2022   [place] LT RADIOACTIVE SEED LOC 03/16/2022 [facility]    BRONCHIAL BIOPSY  10/13/2019   Procedure: BRONCHIAL BIOPSIES;  Surgeon: Garner Nash, DO;  Location: [facility];  Service: Pulmonary;;   BRONCHIAL BRUSHINGS  10/13/2019   Procedure: BRONCHIAL BRUSHINGS;  Surgeon: Garner Nash, DO;  Location: [facility];  Service: Pulmonary;;   BRONCHIAL NEEDLE ASPIRATION BIOPSY  10/13/2019   Procedure: BRONCHIAL NEEDLE ASPIRATION BIOPSIES;  Surgeon: Garner Nash, DO;  Location: [facility];  Service: Pulmonary;;   BRONCHIAL WASHINGS  10/13/2019   Procedure: BRONCHIAL WASHINGS;  Surgeon: Garner Nash, DO;  Location: [facility];  Service: Pulmonary;;   COLONOSCOPY     multiple   DIVERTING ILEOSTOMY N/A 12/14/2021   Procedure: DIVERTING ILEOSTOMY;  Surgeon: Michael Boston, MD;  Location: [facility];  Service: General;  Laterality: N/A;   ILEOSTOMY CLOSURE N/A 03/22/2022   Procedure: OPEN TAKEDOWN OF LOOP ILEOSTOMY;  Surgeon: Michael Boston, MD;  [facility]: [facility];  Service: General;  Laterality: N/A;  GEN w/ERAS PATHWAY LOCAL   INTERCOSTAL NERVE BLOCK Left 12/16/2019   Procedure: INTERCOSTAL NERVE BLOCK;  Surgeon: Lajuana Matte, MD;  Location: [facility];  Service: Thoracic;  Laterality: Left;   KNEE SURGERY  07/20/2008   bilat.   NASAL SEPTUM SURGERY  1995   NODE DISSECTION Left 12/16/2019   Procedure: NODE DISSECTION;  Surgeon: Lajuana Matte, MD;  Location: [facility];  Service: Thoracic;  Laterality: Left;   RADIOACTIVE SEED GUIDED AXILLARY SENTINEL LYMPH NODE Left 03/20/2022   Procedure: RADIOACTIVE SEED GUIDED LEFT AXILLARY SENTINEL LYMPH NODE BIOPSY;  Surgeon: Erroll Luna, MD;  Location: [facility];  Service: General;  Laterality: Left;   RECTAL EXAM UNDER ANESTHESIA N/A 03/22/2022   Procedure: ANORECTAL EXAMINATION UNDER ANESTHESIA;  Surgeon: Michael Boston, MD;  [facility];  Service: General;  Laterality: N/A;   TONSILLECTOMY     TRANSURETHRAL RESECTION OF BLADDER TUMOR  2002   UPPER GASTROINTESTINAL ENDOSCOPY     VIDEO BRONCHOSCOPY WITH  ENDOBRONCHIAL NAVIGATION N/A 10/13/2019   Procedure: VIDEO BRONCHOSCOPY WITH ENDOBRONCHIAL NAVIGATION;  Surgeon: Garner Nash, DO;  Location: [facility];  Service: Pulmonary;  Laterality: N/A;   WISDOM TOOTH EXTRACTION     XI ROBOTIC ASSISTED LOWER ANTERIOR RESECTION N/A 12/14/2021   Procedure: XI ROBOTIC ASSISTED LOWER ANTERIOR ULTRA LOW RECTOSIGMOID RESECTION, COLOANAL HAND SEWN ANASTOMOSIS, AND BILATERAL TAP BLOCK;  Surgeon: Michael Boston, MD;  [facility];  Service: General;  Laterality: N/A;    Social History   Socioeconomic History   Marital status: Married    Spouse name: Not on file   Number of children: 3   Years of education: Not on file   Highest education level: Not on file  Occupational History    Comment: semi retired   Tobacco Use   Smoking status: Former    Packs/day: 0.50    Years: 40.00    Total pack years: 20.00    Types: Cigarettes    Quit  date: 09/21/2019    Years since quitting: 2.5   Smokeless tobacco: Never  Vaping Use   Vaping Use: Never used  Substance and Sexual Activity   Alcohol use: Yes    Alcohol/week: 15.0 standard drinks of alcohol    Types: 15 Standard drinks or equivalent per week    Comment: wine/beer/martini   Drug use: Not Currently    Types: Marijuana    Comment: Smokes marijuana occasional, last use in 08/2019   Sexual activity: Not Currently  Other Topics Concern   Not on file  Social History Narrative   Married   3 children: 3 grandchildren; 3 great grandchildren   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   Enjoys yard work and [place] Strain: [place]  (07/18/2021)   Overall Financial Resource Strain (CARDIA)    Difficulty of Paying Living Expenses: Not hard at [place]: No [place] (03/29/2022)   Hunger Vital Sign    Worried About Running Out of Food in the Last Year: Never true    [place] in the Last Year: Never true  Transportation  Needs: No Transportation Needs (03/29/2022)   PRAPARE - Hydrologist (Medical): No    Lack of Transportation (Non-Medical): No  Physical Activity: Sufficiently Active (07/18/2021)   Exercise Vital Sign    Days of Exercise per Week: 7 days    Minutes of Exercise per Session: 60 min  Stress: No Stress Concern Present (07/18/2021)   [place]    Feeling of Stress : Not at all  Social Connections: Moderately Isolated (07/12/2020)   Social Connection and Isolation Panel [NHANES]    Frequency of Communication with Friends and Family: More than three times a week    Frequency of Social Gatherings with Friends and Family: More than three times a week    Attends Religious Services: Never    Marine scientist or Organizations: No    Attends Archivist Meetings: Never    Marital Status: Married  Human resources officer Violence: Not At Risk (03/22/2022)   Humiliation, Afraid, Rape, and Kick questionnaire    Fear of Current or Ex-Partner: No    Emotionally Abused: No    Physically Abused: No    Sexually Abused: No     No Known Allergies   Outpatient Medications Prior to Visit  Medication Sig Dispense Refill   acetaminophen (TYLENOL) 500 MG tablet Take 1,000 mg by mouth every 8 (eight) hours as needed for moderate pain.     amLODipine (NORVASC) 5 MG tablet Take 1 tablet (5 mg total) by mouth daily. 90 tablet 3   atorvastatin (LIPITOR) 40 MG tablet Take 1/2 tablet (20 mg total) by mouth daily. 90 tablet 3   Cholecalciferol (VITAMIN D3 PO) Take 1 capsule by mouth daily.     ferrous sulfate 325 (65 FE) MG EC tablet Take 1 tablet (325 mg total) by mouth 2 (two) times daily. 60 tablet 1   imatinib (GLEEVEC) 400 MG tablet Take 1 tablet (400 mg total) by mouth daily. Take with meals and large glass of water.Caution:Chemotherapy. (Patient taking differently: Take 400 mg by mouth at bedtime. Take with meals  and large glass of water.Caution:Chemotherapy.) 30 tablet 3   Multiple Vitamins-Minerals (PRESERVISION AREDS 2) CAPS Take 1 capsule by mouth 2 (two) times daily.     omeprazole (PRILOSEC)  40 MG capsule Take 1 capsule (40 mg total) by mouth daily. 90 capsule 3   OVER THE COUNTER MEDICATION Take 3 tablets by mouth daily.  Balance of Nature [employer]ruits     OVER THE COUNTER MEDICATION Take 3 tablets by mouth daily. Balance of Nature Vegetable     oxyCODONE (OXY IR/ROXICODONE) 5 MG immediate release tablet Take 1 tablet (5 mg total) by mouth every 6 (six) hours as needed for severe pain or breakthrough pain. 20 tablet 0   UNABLE TO FIND Take by mouth daily. Med Name: Balance of Petra Kuba     No facility-administered medications prior to visit.    Review of Systems  Constitutional:  Negative for chills, fever, malaise/fatigue and weight loss.  HENT:  Negative for hearing loss, sore throat and tinnitus.   Eyes:  Negative for blurred vision and double vision.  Respiratory:  Negative for cough, hemoptysis, sputum production, shortness of breath, wheezing and stridor.   Cardiovascular:  Negative for chest pain, palpitations, orthopnea, leg swelling and PND.  Gastrointestinal:  Negative for abdominal pain, constipation, diarrhea, heartburn, nausea and vomiting.  Genitourinary:  Negative for dysuria, hematuria and urgency.  Musculoskeletal:  Negative for joint pain and myalgias.  Skin:  Negative for itching and rash.  Neurological:  Negative for dizziness, tingling, weakness and headaches.  Endo/Heme/Allergies:  Negative for environmental allergies. Does not bruise/bleed easily.  Psychiatric/Behavioral:  Negative for depression. The patient is not nervous/anxious and does not have insomnia.   All other systems reviewed and are negative.    Objective:  Physical Exam Vitals reviewed.  Constitutional:      General: He is not in acute distress.    Appearance: He is well-developed.     Comments: Thin  gentleman, muscle wasting present  HENT:     Head: Normocephalic and atraumatic.  Eyes:     General: No scleral icterus.    Conjunctiva/sclera: Conjunctivae normal.     Pupils: Pupils are equal, round, and reactive to light.  Neck:     Vascular: No JVD.     Trachea: No tracheal deviation.  Cardiovascular:     Rate and Rhythm: Normal rate and regular rhythm.     Heart sounds: Normal heart sounds. No murmur heard. Pulmonary:     Effort: Pulmonary effort is normal. No tachypnea, accessory muscle usage or respiratory distress.     Breath sounds: No stridor. No wheezing, rhonchi or rales.     Comments: Diminished breath sounds bilaterally no crackles no wheeze Abdominal:     General: Bowel sounds are normal. There is no distension.     Palpations: Abdomen is soft.     Tenderness: There is no abdominal tenderness.  Musculoskeletal:        General: No tenderness.     Cervical back: Neck supple.  Lymphadenopathy:     Cervical: No cervical adenopathy.  Skin:    General: Skin is warm and dry.     Capillary Refill: Capillary refill takes less than 2 seconds.     Findings: No rash.  Neurological:     Mental Status: He is alert and oriented to person, place, and time.  Psychiatric:        Behavior: Behavior normal.      Vitals:   04/06/22 1008  BP: 110/60  Pulse: 75  Temp: 98 F (36.7 C)  SpO2: 100%  Weight: 153 lb 9.6 oz (69.7 kg)  Height: 5\' 9"  (1.753 m)    100% on RA BMI Readings from  Last 3 Encounters:  04/06/22 22.68 kg/m  04/03/22 22.46 kg/m  03/25/22 21.74 kg/m   Wt Readings from Last 3 Encounters:  04/06/22 153 lb 9.6 oz (69.7 kg)  04/03/22 152 lb 1.6 oz (69 kg)  03/25/22 147 lb 3.2 oz (66.8 kg)     CBC    Component Value Date/Time   WBC 5.4 04/03/2022 0847   WBC 4.5 03/26/2022 0405   RBC 2.99 (L) 04/03/2022 0847   HGB 9.9 (L) 04/03/2022 0847   HCT 29.5 (L) 04/03/2022 0847   PLT 240 04/03/2022 0847   MCV 98.7 04/03/2022 0847   MCH 33.1 04/03/2022  0847   MCHC 33.6 04/03/2022 0847   RDW 14.6 04/03/2022 0847   LYMPHSABS 1.1 04/03/2022 0847   MONOABS 0.3 04/03/2022 0847   EOSABS 0.1 04/03/2022 0847   BASOSABS 0.0 04/03/2022 0847    Chest Imaging: 01/29/2020 chest x-ray: No pneumothorax, postoperative changes from lobectomy. The patient's images have been independently reviewed by me.    October 2023: Nuclear medicine pet imaging hypermetabolic mediastinal and hilar adenopathy as well as axillary lymph node that is hypermetabolic concerning for metastatic lung cancer and recurrence. The patient's images have been independently reviewed by me.    Pulmonary Functions Testing Results:    Latest Ref Rng & Units 09/25/2019    2:38 PM  PFT Results  FVC-Pre L 4.84   FVC-Predicted Pre % 113   FVC-Post L 4.92   FVC-Predicted Post % 115   Pre FEV1/FVC % % 64   Post FEV1/FCV % % 68   FEV1-Pre L 3.10   FEV1-Predicted Pre % 99   FEV1-Post L 3.33   DLCO uncorrected ml/min/mmHg 27.02   DLCO UNC% % 107   DLVA Predicted % 95   TLC L 7.12   TLC % Predicted % 104   RV % Predicted % 100     FeNO:   Pathology:   Navigational bronchoscopy pathology: Positive TTF-1 adenocarcinoma Robotic lobectomy, left upper lobe, 0.9 cm adenocarcinoma, level 10 L, 11 L lymph node involvement Pathology results reviewed  November 2023 axillary lymph node core needle biopsy: Large atypical cells, histiocytosis lymphocytes.  Echocardiogram:   Heart Catheterization:     Assessment & Plan:     ICD-10-CM   1. Adenocarcinoma of left lung, stage 2 (HCC)  C34.92     2. Abnormal PET scan, lung  R94.2     3. Adenopathy  R59.9       Discussion:  This is a 70 year old gentleman initially seen by me for a stage II adenocarcinoma of the left lung status post left upper lobe ectomy by Dr. Kipp Brood.  Had mild COPD at baseline.  He had some positive lymph nodes and was undergoing adjuvant chemotherapy.  He has been under surveillance imaging with Dr.  Earlie Server and medical oncology.  Plan: After receiving his recent imaging as well as ultrasound needle biopsy of the axillary node decision was made for referral to see me to discuss repeat bronchoscopy and tissue sampling. Today in the office we talked about the risk benefits alternatives of proceeding with videobronchoscopy endobronchial ultrasound and transbronchial needle aspiration of the mediastinal adenopathy that was seen on his PET scan. Patient is agreeable to proceed. Tentative bronchoscopy date will be sometime next week [facility] at [facility].    Current Outpatient Medications:    acetaminophen (TYLENOL) 500 MG tablet, Take 1,000 mg by mouth every 8 (eight) hours as needed for moderate pain., Disp: , Rfl:  amLODipine (NORVASC) 5 MG tablet, Take 1 tablet (5 mg total) by mouth daily., Disp: 90 tablet, Rfl: 3   atorvastatin (LIPITOR) 40 MG tablet, Take 1/2 tablet (20 mg total) by mouth daily., Disp: 90 tablet, Rfl: 3   Cholecalciferol (VITAMIN D3 PO), Take 1 capsule by mouth daily., Disp: , Rfl:    ferrous sulfate 325 (65 FE) MG EC tablet, Take 1 tablet (325 mg total) by mouth 2 (two) times daily., Disp: 60 tablet, Rfl: 1   imatinib (GLEEVEC) 400 MG tablet, Take 1 tablet (400 mg total) by mouth daily. Take with meals and large glass of water.Caution:Chemotherapy. (Patient taking differently: Take 400 mg by mouth at bedtime. Take with meals and large glass of water.Caution:Chemotherapy.), Disp: 30 tablet, Rfl: 3   Multiple Vitamins-Minerals (PRESERVISION AREDS 2) CAPS, Take 1 capsule by mouth 2 (two) times daily., Disp: , Rfl:    omeprazole (PRILOSEC) 40 MG capsule, Take 1 capsule (40 mg total) by mouth daily., Disp: 90 capsule, Rfl: 3   OVER THE COUNTER MEDICATION, Take 3 tablets by mouth daily.  Balance of [employer], Disp: , Rfl:    OVER THE COUNTER MEDICATION, Take 3 tablets by mouth daily. Balance of [employer], Disp: , Rfl:    oxyCODONE (OXY IR/ROXICODONE) 5  MG immediate release tablet, Take 1 tablet (5 mg total) by mouth every 6 (six) hours as needed for severe pain or breakthrough pain., Disp: 20 tablet, Rfl: 0   UNABLE TO FIND, Take by mouth daily. Med Name: Balance of Petra Kuba, Disp: , Rfl:   I spent 42 minutes dedicated to the care of this patient on the date of this encounter to include pre-visit review of records, face-to-face time with the patient discussing conditions above, post visit ordering of testing, clinical documentation with the electronic health record, making appropriate referrals as documented, and communicating necessary findings to members of the patients care team.    Garner Nash, DO [facility] 04/06/2022 10:23 AM

## 2022-04-11 ENCOUNTER — Ambulatory Visit (HOSPITAL_BASED_OUTPATIENT_CLINIC_OR_DEPARTMENT_OTHER): Payer: Medicare Other | Admitting: Certified Registered Nurse Anesthetist

## 2022-04-11 ENCOUNTER — Ambulatory Visit (HOSPITAL_COMMUNITY): Payer: Medicare Other | Admitting: Certified Registered Nurse Anesthetist

## 2022-04-11 ENCOUNTER — Other Ambulatory Visit: Payer: Self-pay

## 2022-04-11 ENCOUNTER — Encounter (HOSPITAL_COMMUNITY)
Admission: RE | Disposition: A | Discharge status: Home / Self Care | Payer: Self-pay | Source: Ambulatory Visit | Attending: Pulmonary Disease

## 2022-04-11 ENCOUNTER — Encounter (HOSPITAL_COMMUNITY): Payer: Self-pay | Admitting: Pulmonary Disease

## 2022-04-11 ENCOUNTER — Ambulatory Visit (HOSPITAL_COMMUNITY)
Admission: RE | Admit: 2022-04-11 | Discharge: 2022-04-11 | Disposition: A | Discharge status: Home / Self Care | Payer: Medicare Other | Source: Ambulatory Visit | Attending: Pulmonary Disease | Admitting: Pulmonary Disease

## 2022-04-11 DIAGNOSIS — C3492 Malignant neoplasm of unspecified part of left bronchus or lung: Secondary | ICD-10-CM | POA: Insufficient documentation

## 2022-04-11 DIAGNOSIS — R590 Localized enlarged lymph nodes: Secondary | ICD-10-CM

## 2022-04-11 DIAGNOSIS — R599 Enlarged lymph nodes, unspecified: Secondary | ICD-10-CM

## 2022-04-11 DIAGNOSIS — J449 Chronic obstructive pulmonary disease, unspecified: Secondary | ICD-10-CM

## 2022-04-11 DIAGNOSIS — E785 Hyperlipidemia, unspecified: Secondary | ICD-10-CM | POA: Insufficient documentation

## 2022-04-11 DIAGNOSIS — Z9221 Personal history of antineoplastic chemotherapy: Secondary | ICD-10-CM | POA: Diagnosis not present

## 2022-04-11 DIAGNOSIS — K219 Gastro-esophageal reflux disease without esophagitis: Secondary | ICD-10-CM | POA: Diagnosis not present

## 2022-04-11 DIAGNOSIS — Z85118 Personal history of other malignant neoplasm of bronchus and lung: Secondary | ICD-10-CM | POA: Diagnosis not present

## 2022-04-11 DIAGNOSIS — I10 Essential (primary) hypertension: Secondary | ICD-10-CM | POA: Diagnosis not present

## 2022-04-11 DIAGNOSIS — Z20822 Contact with and (suspected) exposure to covid-19: Secondary | ICD-10-CM | POA: Insufficient documentation

## 2022-04-11 DIAGNOSIS — Z87891 Personal history of nicotine dependence: Secondary | ICD-10-CM | POA: Insufficient documentation

## 2022-04-11 HISTORY — PX: ENDOBRONCHIAL ULTRASOUND: SHX5096

## 2022-04-11 HISTORY — PX: BRONCHIAL NEEDLE ASPIRATION BIOPSY: SHX5106

## 2022-04-11 HISTORY — PX: VIDEO BRONCHOSCOPY: SHX5072

## 2022-04-11 LAB — RESP PANEL BY RT-PCR (RSV, FLU A&B, COVID)  RVPGX2
Influenza A by PCR: NEGATIVE
Influenza B by PCR: NEGATIVE
Resp Syncytial Virus by PCR: NEGATIVE
SARS Coronavirus 2 by RT PCR: NEGATIVE

## 2022-04-11 SURGERY — BRONCHOSCOPY, VIDEO-ASSISTED
Anesthesia: General | Laterality: Bilateral

## 2022-04-11 MED ORDER — PROPOFOL 10 MG/ML IV BOLUS
INTRAVENOUS | Status: AC
  Filled 2022-04-11: qty 20

## 2022-04-11 MED ORDER — EPHEDRINE SULFATE-NACL 50-0.9 MG/10ML-% IV SOSY
PREFILLED_SYRINGE | INTRAVENOUS | Status: DC | PRN
  Administered 2022-04-11: 5 mg via INTRAVENOUS
  Administered 2022-04-11: 10 mg via INTRAVENOUS

## 2022-04-11 MED ORDER — ONDANSETRON HCL 4 MG/2ML IJ SOLN: 4.0000 mg | Freq: Once | INTRAMUSCULAR | Status: DC | PRN

## 2022-04-11 MED ORDER — ROCURONIUM BROMIDE 10 MG/ML (PF) SYRINGE
PREFILLED_SYRINGE | INTRAVENOUS | Status: DC | PRN
  Administered 2022-04-11: 60 mg via INTRAVENOUS

## 2022-04-11 MED ORDER — FENTANYL CITRATE (PF) 100 MCG/2ML IJ SOLN
INTRAMUSCULAR | Status: DC | PRN
  Administered 2022-04-11 (×2): 50 ug via INTRAVENOUS

## 2022-04-11 MED ORDER — DEXAMETHASONE SODIUM PHOSPHATE 10 MG/ML IJ SOLN
INTRAMUSCULAR | Status: DC | PRN
  Administered 2022-04-11: 10 mg via INTRAVENOUS

## 2022-04-11 MED ORDER — ONDANSETRON HCL 4 MG/2ML IJ SOLN
INTRAMUSCULAR | Status: DC | PRN
  Administered 2022-04-11: 4 mg via INTRAVENOUS

## 2022-04-11 MED ORDER — LIDOCAINE 2% (20 MG/ML) 5 ML SYRINGE
INTRAMUSCULAR | Status: DC | PRN
  Administered 2022-04-11: 80 mg via INTRAVENOUS

## 2022-04-11 MED ORDER — FENTANYL CITRATE (PF) 100 MCG/2ML IJ SOLN
INTRAMUSCULAR | Status: AC
  Filled 2022-04-11: qty 2

## 2022-04-11 MED ORDER — SUGAMMADEX SODIUM 200 MG/2ML IV SOLN
INTRAVENOUS | Status: DC | PRN
  Administered 2022-04-11: 200 mg via INTRAVENOUS

## 2022-04-11 MED ORDER — PROPOFOL 10 MG/ML IV BOLUS
INTRAVENOUS | Status: DC | PRN
  Administered 2022-04-11: 160 mg via INTRAVENOUS

## 2022-04-11 MED ORDER — LACTATED RINGERS IV SOLN: INTRAVENOUS | Status: DC

## 2022-04-11 NOTE — Interval H&P Note (Signed)
History and Physical Interval Note:  04/11/2022 10:16 AM  Jesse [name]  has presented today for surgery, with the diagnosis of adenopathy.  The various methods of treatment have been discussed with the patient and family. After consideration of risks, benefits and other options for treatment, the patient has consented to  Procedure(s): ENDOBRONCHIAL ULTRASOUND (Bilateral) as a surgical intervention.  The patient's history has been reviewed, patient examined, no change in status, stable for surgery.  I have reviewed the patient's chart and labs.  Questions were answered to the patient's satisfaction.     [place]

## 2022-04-11 NOTE — Anesthesia Postprocedure Evaluation (Signed)
Anesthesia Post Note  Patient: Jesse [name]  Procedure(s) Performed: ENDOBRONCHIAL ULTRASOUND (Bilateral) BRONCHIAL NEEDLE ASPIRATION BIOPSIES VIDEO BRONCHOSCOPY     Patient location during evaluation: PACU Anesthesia Type: General Level of consciousness: awake and alert Pain management: pain level controlled Vital Signs Assessment: post-procedure vital signs reviewed and stable Respiratory status: spontaneous breathing, nonlabored ventilation, respiratory function stable and patient connected to nasal cannula oxygen Cardiovascular status: blood pressure returned to baseline and stable Postop Assessment: no apparent nausea or vomiting Anesthetic complications: no  No notable events documented.  Last Vitals:  Vitals:   04/11/22 0945 04/11/22 1229  BP: 123/60 (!) 101/52  Pulse: 70 76  Resp: 18 (!) 21  Temp: 36.7 C (!) 36.2 C  SpO2: 98% 100%    Last Pain:  Vitals:   04/11/22 1229  TempSrc: Temporal  PainSc: 0-No pain                 Latorya Bautch S

## 2022-04-11 NOTE — Anesthesia Preprocedure Evaluation (Addendum)
Anesthesia Evaluation  Patient identified by MRN, date of birth, ID band Patient awake    Reviewed: Allergy & Precautions, H&P , NPO status , Patient's Chart, lab work & pertinent test results  Airway Mallampati: I  TM Distance: >3 FB Neck ROM: Full    Dental no notable dental hx.    Pulmonary COPD, former smoker Adenocarcinoma of left lung, stage 2 (HCC)     Pulmonary exam normal breath sounds clear to auscultation       Cardiovascular hypertension, Normal cardiovascular exam Rhythm:Regular Rate:Normal     Neuro/Psych negative neurological ROS  negative psych ROS   GI/Hepatic Neg liver ROS,GERD  ,,  Endo/Other  negative endocrine ROS    Renal/GU negative Renal ROS  negative genitourinary   Musculoskeletal negative musculoskeletal ROS (+)    Abdominal   Peds negative pediatric ROS (+)  Hematology negative hematology ROS (+)   Anesthesia Other Findings   Reproductive/Obstetrics negative OB ROS                             Anesthesia Physical Anesthesia Plan  ASA: 3  Anesthesia Plan: General   Post-op Pain Management: Minimal or no pain anticipated   Induction: Intravenous  PONV Risk Score and Plan: 2 and Ondansetron and Treatment may vary due to age or medical condition  Airway Management Planned: Oral ETT  Additional Equipment:   Intra-op Plan:   Post-operative Plan: Extubation in OR  Informed Consent: I have reviewed the patients History and Physical, chart, labs and discussed the procedure including the risks, benefits and alternatives for the proposed anesthesia with the patient or authorized representative who has indicated his/her understanding and acceptance.     Dental advisory given  Plan Discussed with: CRNA and Surgeon  Anesthesia Plan Comments:        Anesthesia Quick Evaluation

## 2022-04-11 NOTE — Anesthesia Procedure Notes (Signed)
Procedure Name: Intubation Date/Time: 04/11/2022 11:57 AM  Performed by: Genelle Bal, CRNAPre-anesthesia Checklist: Patient identified, Emergency Drugs available, Suction available and Patient being monitored Patient Re-evaluated:Patient Re-evaluated prior to induction Oxygen Delivery Method: Circle system utilized Preoxygenation: Pre-oxygenation with 100% oxygen Induction Type: IV induction Ventilation: Mask ventilation with difficulty Laryngoscope Size: Miller and 2 Grade View: Grade I Tube type: Oral Tube size: 9.0 mm Number of attempts: 1 Airway Equipment and Method: Stylet and Oral airway Placement Confirmation: ETT inserted through vocal cords under direct vision, positive ETCO2 and breath sounds checked- equal and bilateral Secured at: 23 cm Tube secured with: Tape Dental Injury: Teeth and Oropharynx as per pre-operative assessment

## 2022-04-11 NOTE — Transfer of Care (Signed)
Immediate Anesthesia Transfer of Care Note  Patient: Jesse [name]  Procedure(s) Performed: ENDOBRONCHIAL ULTRASOUND (Bilateral) BRONCHIAL NEEDLE ASPIRATION BIOPSIES VIDEO BRONCHOSCOPY  Patient Location: Endoscopy Unit  Anesthesia Type:General  Level of Consciousness: awake, alert , and oriented  Airway & Oxygen Therapy: Patient Spontanous Breathing and Patient connected to face mask oxygen  Post-op Assessment: Report given to RN and Post -op Vital signs reviewed and stable  Post vital signs: Reviewed and stable  Last Vitals:  Vitals Value Taken Time  BP 101/52 04/11/22 1230  Temp    Pulse 70 04/11/22 1230  Resp 21 04/11/22 1230  SpO2 100 % 04/11/22 1230  Vitals shown include unvalidated device data.  Last Pain:  Vitals:   04/11/22 0945  TempSrc: Temporal  PainSc: 0-No pain         Complications: No notable events documented.

## 2022-04-11 NOTE — Op Note (Signed)
Video Bronchoscopy with Endobronchial Ultrasound Procedure Note  Date of Operation: 04/11/2022  Pre-op Diagnosis: Adenopathy   Post-op Diagnosis: Adenopathy   Surgeon: Garner Nash, DO   Assistants: None   Anesthesia: General endotracheal anesthesia  Operation: Flexible video fiberoptic bronchoscopy with endobronchial ultrasound and biopsies.  Estimated Blood Loss: Minimal  Complications: none   Indications and History: Jesse [name] is a 71 y.o. male with adenopathy.  The risks, benefits, complications, treatment options and expected outcomes were discussed with the patient.  The possibilities of pneumothorax, pneumonia, reaction to medication, pulmonary aspiration, perforation of a viscus, bleeding, failure to diagnose a condition and creating a complication requiring transfusion or operation were discussed with the patient who freely signed the consent.    Description of Procedure: The patient was examined in the preoperative area and history and data from the preprocedure consultation were reviewed. It was deemed appropriate to proceed.  The patient was taken [facility], identified as Jesse [name] and the procedure verified as Flexible Video Fiberoptic Bronchoscopy.  A Time Out was held and the above information confirmed. After being taken to the operating room general anesthesia was initiated and the patient  was orally intubated. The video fiberoptic bronchoscope was introduced via the endotracheal tube and a general inspection was performed which showed normal right and left anatomy, LUL lobectomy, visible suture lines. The standard scope was then withdrawn and the endobronchial ultrasound was used to identify and characterize the peritracheal, hilar and bronchial lymph nodes. Inspection showed endlarge subcarinal nodes. Using real-time ultrasound guidance Wang needle biopsies were take from Station 7 nodes and were sent for cytology. The patient tolerated the procedure well  without apparent complications. There was no significant blood loss. The bronchoscope was withdrawn. Anesthesia was reversed and the patient was taken to the PACU for recovery.   Samples: 1. Wang needle biopsies from Station 7 node  Plans:  The patient will be discharged from the PACU to home when recovered from anesthesia. We will review the cytology, pathology and microbiology results with the patient when they become available. Outpatient followup will be with Dr. Julien Nordmann.   Garner Nash, DO [facility] 04/11/2022 12:32 PM

## 2022-04-13 ENCOUNTER — Encounter (HOSPITAL_COMMUNITY): Payer: Self-pay | Admitting: Pulmonary Disease

## 2022-04-16 DIAGNOSIS — C3490 Malignant neoplasm of unspecified part of unspecified bronchus or lung: Secondary | ICD-10-CM | POA: Diagnosis not present

## 2022-04-17 LAB — SURGICAL PATHOLOGY

## 2022-04-19 LAB — CYTOLOGY - NON PAP

## 2022-04-20 ENCOUNTER — Telehealth: Payer: Self-pay | Admitting: Pharmacist

## 2022-04-20 NOTE — Progress Notes (Signed)
Care Management & Coordination Services Pharmacy Team  Name: Jesse [name]  MRN: [id number] DOB: 1951/02/22  Reason for Encounter: Hypertension   Recent office visits:  None  Recent consult visits:  04/06/2022 June Leap L, DO (pulm) - Patient was seen for Adenocarcinoma of left lung, stage 2 and additional concerns. No medication changes.   04/03/2022 Heilingoetter, Cassandra L, PA-C (Oncology) - Patient was seen for Adenocarcinoma of left lung, stage 2. No medication changes.   02/27/2022 Cornett, Beryle Lathe, MD ([facility]) - Patient was seen for Lymphadenopathy and additional concerns. No medication changes.   02/22/2022 Heilingoetter, Cassandra L, PA-C (Oncology) - Patient was seen for Adenocarcinoma of left lung, stage 2 and additional concerns. No medication changes.   02/05/2022 Curt Bears, MD (Oncology) - Patient was seen for Adenocarcinoma of left lung, stage 2 and additional concerns. Started methylprednisolone.   01/22/2022 Curt Bears, MD (Oncology) - Patient was seen for Adenocarcinoma of left lung, stage 2 and additional concerns. Discontinued Ibuprofen.  Hospital visits:  Admitted to [facility] on 04/11/2022 (4 hours) due to adenocarcinoma of left lung and adenopathy. Discharge date was 04/11/2022.    New?Medications Started at [facility] Discharge:?? No new medications Medication Changes at Hospital Discharge: No medication changes Medications Discontinued at Hospital Discharge: No medications discontinued Medications that remain the same after Hospital Discharge:??  -All other medications will remain the same.    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 Discharge date was 03/26/2022.    New?Medications Started at [facility] Discharge:?? Started ferrous sulfate Medication Changes at Hospital Discharge: Changed oxyCODONE Medications Discontinued at Hospital  Discharge: Discontinued Tramadol Medications that remain the same after Hospital Discharge:??  -All other medications will remain the same.    Admitted to [facility] on 03/20/2022 (5 hours) due to [place] . Discharge date was 03/20/2022.    New?Medications Started at [facility] Discharge:?? Started oxyCODONE Medication Changes at Hospital Discharge: No medication changes Medications Discontinued at Hospital Discharge: No medications discontinued Medications that remain the same after Hospital Discharge:??  -All other medications will remain the same.    Medications: Outpatient Encounter Medications as of 04/20/2022  Medication Sig Note   acetaminophen (TYLENOL) 500 MG tablet Take 1,000 mg by mouth every 8 (eight) hours as needed for moderate pain.    amLODipine (NORVASC) 5 MG tablet Take 1 tablet (5 mg total) by mouth daily.    atorvastatin (LIPITOR) 40 MG tablet Take 1/2 tablet (20 mg total) by mouth daily.    Cholecalciferol (VITAMIN D3 PO) Take 1 capsule by mouth daily.    diphenoxylate-atropine (LOMOTIL) 2.5-0.025 MG tablet Take 2 tablets by mouth 4 (four) times daily as needed for diarrhea or loose stools.    ferrous sulfate 325 (65 FE) MG EC tablet Take 1 tablet (325 mg total) by mouth 2 (two) times daily.    imatinib (GLEEVEC) 400 MG tablet Take 1 tablet (400 mg total) by mouth daily. Take with meals and large glass of water.Caution:Chemotherapy. (Patient taking differently: Take 400 mg by mouth at bedtime. Take with meals and large glass of water.Caution:Chemotherapy.)    loperamide (IMODIUM A-D) 2 MG tablet Take 4 mg by mouth 4 (four) times daily as needed for diarrhea or loose stools.    Multiple Vitamins-Minerals (PRESERVISION AREDS 2) CAPS Take 1 capsule by mouth 2 (two) times daily.    omeprazole (PRILOSEC) 40 MG capsule Take 1 capsule (40  mg total) by mouth daily.    OVER THE COUNTER MEDICATION Take 3 tablets by  mouth daily.  Balance of Nature Fruits    OVER THE COUNTER MEDICATION Take 3 tablets by mouth daily. Balance of Nature Vegetable    oxyCODONE (OXY IR/ROXICODONE) 5 MG immediate release tablet Take 1 tablet (5 mg total) by mouth every 6 (six) hours as needed for severe pain or breakthrough pain. 04/06/2022: Pt has med but has not taken    No facility-administered encounter medications on file as of 04/20/2022.   Recent Office Vitals: BP Readings from Last 3 Encounters:  04/11/22 (!) 118/48  04/06/22 110/60  04/03/22 (!) 141/75   Pulse Readings from Last 3 Encounters:  04/11/22 69  04/06/22 75  04/03/22 77    Wt Readings from Last 3 Encounters:  04/11/22 151 lb (68.5 kg)  04/06/22 153 lb 9.6 oz (69.7 kg)  04/03/22 152 lb 1.6 oz (69 kg)     Kidney Function Lab Results  Component Value Date/Time   CREATININE 0.81 04/03/2022 08:47 AM   CREATININE 0.74 03/26/2022 04:05 AM   CREATININE 0.73 03/24/2022 09:29 AM   CREATININE 1.12 01/19/2022 09:31 AM   GFR 87.28 10/25/2021 08:51 AM   GFRNONAA >60 04/03/2022 08:47 AM   GFRAA >60 12/31/2019 02:28 PM       Latest Ref Rng & Units 04/03/2022    8:47 AM 03/26/2022    4:05 AM 03/24/2022    9:29 AM  BMP  Glucose 70 - 99 mg/dL 113     BUN 8 - 23 mg/dL 17     Creatinine 0.61 - 1.24 mg/dL 0.81  0.74  0.73   Sodium 135 - 145 mmol/L 142     Potassium 3.5 - 5.1 mmol/L 4.2  4.2  3.2   Chloride 98 - 111 mmol/L 108     CO2 22 - 32 mmol/L 26     Calcium 8.9 - 10.3 mg/dL 8.9      Current antihypertensive regimen:  Amlodipine 5 mg daily  Patient verbally confirms he is taking the above medications as directed. Yes  How often are you checking your Blood Pressure? Patient is not checking blood pressures at home recently due to the number of office visits he has had.   Current home BP readings: Patient was not at home at the time of the call.  Patient is not checking blood pressures at home recently due to the number of office visits he has had.    Wrist or arm cuff: Arm  What recent interventions/DTPs have been made by any provider to improve Blood Pressure control since last CPP Visit: No recent interventions.  Any recent hospitalizations or ED visits since last visit with CPP?  Admitted to [facility] on 03/20/2022 (5 hours) due to [place] . Discharge date was 03/20/2022. Admitted to 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 Discharge date was 03/26/2022.  Admitted to [facility] on 04/11/2022 (4 hours) due to adenocarcinoma of left lung and adenopathy. Discharge date was 04/11/2022.        What diet changes have been made to improve Blood Pressure Control?  Patient follow a high fiber low sodium diet since the take down of his illiostomy.  Breakfast - patient will have scrambled eggs wrapped in a tortilla.  Lunch - patient will have some fruit with a yogurt Dinner - patient will eat what his wife  makes, she is a very good cook (currently he is not eating a lot due to lack of appetite) Snacks - patient is drinking 2 high protein drinks daily Sodium intake - patient is following a low sodium Caffeine intake - he no longer is drinking caffeine drinks  What exercise is being done to improve your Blood Pressure Control?  Walking 30 min daily around his yard with his dog.  Adherence Review: Is the patient currently on ACE/ARB medication? No Does the patient have >5 day gap between last estimated fill dates? No  Care Gaps: AWV - completed 07/18/2021 Last BP - 118/48 on 04/11/2022  Star Rating Drugs: Atorvastatin 40mg  - last filled 02/20/2022 90 DS at [place] Pharmacist Assistant [phone]

## 2022-04-23 ENCOUNTER — Telehealth: Payer: Self-pay | Admitting: Internal Medicine

## 2022-04-23 NOTE — Telephone Encounter (Signed)
Called patient regarding rescheduled 01/17 appointments, patient is notified of new appointment.

## 2022-04-25 ENCOUNTER — Telehealth: Payer: Self-pay | Admitting: Pulmonary Disease

## 2022-04-25 ENCOUNTER — Other Ambulatory Visit (HOSPITAL_COMMUNITY): Payer: Self-pay | Admitting: Surgery

## 2022-04-25 DIAGNOSIS — Z9889 Other specified postprocedural states: Secondary | ICD-10-CM

## 2022-04-25 NOTE — Telephone Encounter (Signed)
Please advise sir:  Pathology called to inform Dr. Valeta Harms that [facility] called to say that the tumor tissue that was sent to them was not sufficient to perform the test. Please advise and call to discuss further at [phone]

## 2022-04-25 NOTE — Progress Notes (Signed)
I called and left message on patient's voicemail to let us know that we had his final pathology results.  He has an appointment to see medical oncology on the 15th.  Thanks,  BLI  Garner Nash, DO [facility] 04/25/2022 4:42 PM

## 2022-04-25 NOTE — Telephone Encounter (Signed)
Pathology called to inform Dr. Valeta Harms that [facility] called to say that the tumor tissue that was sent to them was not sufficient to perform the test.  Please advise and call to discuss further at [phone]

## 2022-04-26 ENCOUNTER — Ambulatory Visit (HOSPITAL_COMMUNITY)
Admission: RE | Admit: 2022-04-26 | Discharge: 2022-04-26 | Disposition: A | Discharge status: Home / Self Care | Payer: Medicare Other | Source: Ambulatory Visit | Attending: Surgery | Admitting: Surgery

## 2022-04-26 DIAGNOSIS — N200 Calculus of kidney: Secondary | ICD-10-CM | POA: Insufficient documentation

## 2022-04-26 DIAGNOSIS — C3412 Malignant neoplasm of upper lobe, left bronchus or lung: Secondary | ICD-10-CM | POA: Diagnosis not present

## 2022-04-26 DIAGNOSIS — I700 Atherosclerosis of aorta: Secondary | ICD-10-CM | POA: Diagnosis not present

## 2022-04-26 DIAGNOSIS — Z9889 Other specified postprocedural states: Secondary | ICD-10-CM | POA: Diagnosis not present

## 2022-04-26 DIAGNOSIS — N400 Enlarged prostate without lower urinary tract symptoms: Secondary | ICD-10-CM | POA: Insufficient documentation

## 2022-04-26 MED ORDER — IOHEXOL 350 MG/ML SOLN
75.0000 mL | Freq: Once | INTRAVENOUS | Status: AC | PRN
  Administered 2022-04-26: 75 mL via INTRAVENOUS

## 2022-04-27 DIAGNOSIS — Z9889 Other specified postprocedural states: Secondary | ICD-10-CM | POA: Diagnosis not present

## 2022-04-27 DIAGNOSIS — I10 Essential (primary) hypertension: Secondary | ICD-10-CM | POA: Diagnosis not present

## 2022-04-27 DIAGNOSIS — D696 Thrombocytopenia, unspecified: Secondary | ICD-10-CM | POA: Diagnosis not present

## 2022-04-30 ENCOUNTER — Inpatient Hospital Stay: Payer: Medicare Other | Attending: Internal Medicine | Admitting: Internal Medicine

## 2022-04-30 VITALS — BP 112/63 | HR 69 | Temp 97.8°F | Resp 17 | Wt 149.0 lb

## 2022-04-30 DIAGNOSIS — Z902 Acquired absence of lung [part of]: Secondary | ICD-10-CM | POA: Insufficient documentation

## 2022-04-30 DIAGNOSIS — I10 Essential (primary) hypertension: Secondary | ICD-10-CM | POA: Diagnosis not present

## 2022-04-30 DIAGNOSIS — Z79899 Other long term (current) drug therapy: Secondary | ICD-10-CM | POA: Insufficient documentation

## 2022-04-30 DIAGNOSIS — C3490 Malignant neoplasm of unspecified part of unspecified bronchus or lung: Secondary | ICD-10-CM

## 2022-04-30 DIAGNOSIS — C49A9 Gastrointestinal stromal tumor of other sites: Secondary | ICD-10-CM | POA: Diagnosis not present

## 2022-04-30 DIAGNOSIS — Z5111 Encounter for antineoplastic chemotherapy: Secondary | ICD-10-CM | POA: Diagnosis not present

## 2022-04-30 DIAGNOSIS — C3412 Malignant neoplasm of upper lobe, left bronchus or lung: Secondary | ICD-10-CM | POA: Insufficient documentation

## 2022-04-30 DIAGNOSIS — C771 Secondary and unspecified malignant neoplasm of intrathoracic lymph nodes: Secondary | ICD-10-CM | POA: Insufficient documentation

## 2022-04-30 MED ORDER — PROCHLORPERAZINE MALEATE 10 MG PO TABS: 10.0000 mg | ORAL_TABLET | Freq: Four times a day (QID) | ORAL | 0 refills | Status: DC | PRN

## 2022-04-30 MED ORDER — MEGESTROL ACETATE 625 MG/5ML PO SUSP: 625.0000 mg | Freq: Every day | ORAL | 0 refills | Status: DC

## 2022-04-30 NOTE — Progress Notes (Signed)
DISCONTINUE ON PATHWAY REGIMEN - Non-Small Cell Lung     A cycle is every 21 days:     Pemetrexed      Cisplatin   **Always confirm dose/schedule in your pharmacy ordering system**  REASON: Disease Progression PRIOR TREATMENT: [id number]: Cisplatin + Pemetrexed q3 Weeks x 4 Cycles TREATMENT RESPONSE: Unable to Evaluate  START OFF PATHWAY REGIMEN - Non-Small Cell Lung   [id number]:Carboplatin AUC=2 IV D1 + Paclitaxel 45 mg/m2 IV D1 q7 Days + RT:   A cycle is every 7 days, concurrent with RT:     Paclitaxel      Carboplatin   **Always confirm dose/schedule in your pharmacy ordering system**  Patient Characteristics: Local Recurrence Therapeutic Status: Local Recurrence Intent of Therapy: Curative Intent, Discussed with Patient

## 2022-04-30 NOTE — Progress Notes (Signed)
[facility] [phone]   [phone]  OFFICE PROGRESS NOTE  Jesse Saint, MD [address] [place] [address]  DIAGNOSIS:  1) recurrent non-small cell lung cancer, adenocarcinoma with positive mediastinal lymph node in December 2023 that was initially diagnosed as stage IIB (T1 a, N1, M0) non-small cell lung cancer, adenocarcinoma diagnosed in June 2021. 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  Biomarker Findings Tumor Mutational Burden - 20 Muts/Mb Microsatellite status - MS-Stable Genomic Findings For a complete list of the genes assayed, please refer to the Appendix. KEAP1 I431fs*17 DNMT3A E817* RB1 splice site 1960+5G>C TP53 R209* 8 Disease relevant genes with no reportable alterations: ALK, BRAF, EGFR, ERBB2, KRAS, MET, RET, ROS1  PDL1 Expression: 1 %  PRIOR THERAPY:  1) status post left upper lobectomy with lymph node dissection on December 16, 2019 under the care of Dr. Cliffton Asters.  The tumor size measured 0.9 cm but there was involvement of the level 10 L and 11 L.  2) Adjuvant systemic chemotherapy with cisplatin 75 mg/M2 and Alimta 500 mg/M2 every 3 weeks.  First dose February 04, 2020.  Status post 4 cycles. 3) Neoadjuvant treatment with imatinib 400 mg p.o. daily for the GIST tumor of the pelvic area.  First dose started July 18, 2020.  Status post 12 months of treatment. 4) status post robotic assisted very low anterior rectosigmoid resection with coloanal anastomosis, diverting loop ileostomy and wedge liver biopsy under the care of Dr. Michaell Cowing on December 14, 2021 and it showed minimal residual gastrointestinal stromal tumor.  CURRENT THERAPY:  1) concurrent chemoradiation with weekly carboplatin for AUC of 2 and paclitaxel 45 Mg/M2.  First dose May 14, 2022. 2) Imatinib 400 mg p.o. daily  INTERVAL HISTORY: Jesse [name] 72 y.o. male returns to the clinic today for follow-up visit accompanied by his  wife.  The patient is feeling fine today with no concerning complaints except for the weight loss and fatigue.  He has lack of appetite.  He denied having any current chest pain but has shortness of breath with exertion with no cough or hemoptysis.  He has no nausea, vomiting, diarrhea or constipation.  He has no headache or visual changes.  He was seen by Dr. Tonia Brooms and underwent bronchoscopy with EBUS and biopsy of station 7 lymph node that was positive for recurrent adenocarcinoma of the lung.  The patient is here today for evaluation and discussion of his treatment options.    MEDICAL HISTORY: Past Medical History:  Diagnosis Date   Anemia    Cancer (HCC)    bladder cancer   COPD (chronic obstructive pulmonary disease) (HCC)    mild    DIVERTICULOSIS, COLON 01/14/2007   Qualifier: Diagnosis of  By: Marcelyn Ditty RN, Katy Fitch    GASTRITIS, CHRONIC 06/01/2004   Qualifier: Diagnosis of  By: Creta Levin CMA (AAMA), Robin     GERD 01/09/2007   Qualifier: Diagnosis of  By: Tawanna Cooler, RN, Ellen     Heart murmur    as a child; no murmur heard 12/14/19   HYPERLIPIDEMIA 01/09/2007   Qualifier: Diagnosis of  By: Everett Graff     Hypertension    Lung mass    left upper nodule   Macular degeneration    right eye   NEOPLASM, MALIGNANT, BLADDER, HX OF 2002   Qualifier: Diagnosis of  By: Creta Levin CMA (AAMA), Robin  / no chemo or radiation   OSTEOARTHRITIS 01/14/2007  Qualifier: Diagnosis of  By: Marcelyn Ditty, RN, Katy Fitch - knees   Rectal mass    Reflux esophagitis 06/01/2004   Qualifier: Diagnosis of  By: Creta Levin CMA (AAMA), Robin     TOBACCO USER 02/16/2009   Qualifier: Diagnosis of  By: Amador Cunas  MD, Janett Labella    VITAMIN D DEFICIENCY 06/03/2007   Qualifier: Diagnosis of  By: Amador Cunas  MD, Janett Labella     ALLERGIES:  has No Known Allergies.  MEDICATIONS:  Current Outpatient Medications  Medication Sig Dispense Refill   acetaminophen (TYLENOL) 500 MG tablet Take 1,000 mg by mouth every 8 (eight)  hours as needed for moderate pain.     amLODipine (NORVASC) 5 MG tablet Take 1 tablet (5 mg total) by mouth daily. 90 tablet 3   atorvastatin (LIPITOR) 40 MG tablet Take 1/2 tablet (20 mg total) by mouth daily. 90 tablet 3   Cholecalciferol (VITAMIN D3 PO) Take 1 capsule by mouth daily.     diphenoxylate-atropine (LOMOTIL) 2.5-0.025 MG tablet Take 2 tablets by mouth 4 (four) times daily as needed for diarrhea or loose stools.     ferrous sulfate 325 (65 FE) MG EC tablet Take 1 tablet (325 mg total) by mouth 2 (two) times daily. 60 tablet 1   imatinib (GLEEVEC) 400 MG tablet Take 1 tablet (400 mg total) by mouth daily. Take with meals and large glass of water.Caution:Chemotherapy. (Patient taking differently: Take 400 mg by mouth at bedtime. Take with meals and large glass of water.Caution:Chemotherapy.) 30 tablet 3   loperamide (IMODIUM A-D) 2 MG tablet Take 4 mg by mouth 4 (four) times daily as needed for diarrhea or loose stools.     Multiple Vitamins-Minerals (PRESERVISION AREDS 2) CAPS Take 1 capsule by mouth 2 (two) times daily.     omeprazole (PRILOSEC) 40 MG capsule Take 1 capsule (40 mg total) by mouth daily. 90 capsule 3   OVER THE COUNTER MEDICATION Take 3 tablets by mouth daily.  Balance of Nature Fruits     OVER THE COUNTER MEDICATION Take 3 tablets by mouth daily. Balance of Nature Vegetable     oxyCODONE (OXY IR/ROXICODONE) 5 MG immediate release tablet Take 1 tablet (5 mg total) by mouth every 6 (six) hours as needed for severe pain or breakthrough pain. 20 tablet 0   No current facility-administered medications for this visit.    SURGICAL HISTORY:  Past Surgical History:  Procedure Laterality Date   BREAST BIOPSY Left 03/16/2022   [place] LT RADIOACTIVE SEED LOC 03/16/2022 [facility]   BRONCHIAL BIOPSY  10/13/2019   Procedure: BRONCHIAL BIOPSIES;  Surgeon: Josephine Igo, DO;  Location: [facility];  Service: Pulmonary;;   BRONCHIAL BRUSHINGS  10/13/2019   Procedure:  BRONCHIAL BRUSHINGS;  Surgeon: Josephine Igo, DO;  Location: [facility];  Service: Pulmonary;;   BRONCHIAL NEEDLE ASPIRATION BIOPSY  10/13/2019   Procedure: BRONCHIAL NEEDLE ASPIRATION BIOPSIES;  Surgeon: Josephine Igo, DO;  Location: [facility];  Service: Pulmonary;;   BRONCHIAL NEEDLE ASPIRATION BIOPSY  04/11/2022   Procedure: BRONCHIAL NEEDLE ASPIRATION BIOPSIES;  Surgeon: Josephine Igo, DO;  Location: [facility];  Service: Cardiopulmonary;;   BRONCHIAL WASHINGS  10/13/2019   Procedure: BRONCHIAL WASHINGS;  Surgeon: Josephine Igo, DO;  Location: [facility];  Service: Pulmonary;;   COLONOSCOPY     multiple   DIVERTING ILEOSTOMY N/A 12/14/2021   Procedure: DIVERTING ILEOSTOMY;  Surgeon: Karie Soda, MD;  Location: [facility];  Service: General;  Laterality: N/A;   ENDOBRONCHIAL  ULTRASOUND Bilateral 04/11/2022   Procedure: ENDOBRONCHIAL ULTRASOUND;  Surgeon: Josephine Igo, DO;  Location: [facility];  Service: Cardiopulmonary;  Laterality: Bilateral;   ILEOSTOMY CLOSURE N/A 03/22/2022   Procedure: OPEN TAKEDOWN OF LOOP ILEOSTOMY;  Surgeon: Karie Soda, MD;  Location: [facility];  Service: General;  Laterality: N/A;  GEN w/ERAS PATHWAY LOCAL   INTERCOSTAL NERVE BLOCK Left 12/16/2019   Procedure: INTERCOSTAL NERVE BLOCK;  Surgeon: Corliss Skains, MD;  Location: [facility];  Service: Thoracic;  Laterality: Left;   KNEE SURGERY  07/20/2008   bilat.   NASAL SEPTUM SURGERY  1995   NODE DISSECTION Left 12/16/2019   Procedure: NODE DISSECTION;  Surgeon: Corliss Skains, MD;  Location: [facility];  Service: Thoracic;  Laterality: Left;   RADIOACTIVE SEED GUIDED AXILLARY SENTINEL LYMPH NODE Left 03/20/2022   Procedure: RADIOACTIVE SEED GUIDED LEFT AXILLARY SENTINEL LYMPH NODE BIOPSY;  Surgeon: Harriette Bouillon, MD;  Location: [facility];  Service: General;  Laterality: Left;   RECTAL EXAM UNDER ANESTHESIA N/A 03/22/2022   Procedure: ANORECTAL EXAMINATION UNDER ANESTHESIA;  Surgeon: Karie Soda, MD;  Location: [facility];  Service: General;  Laterality: N/A;   TONSILLECTOMY     TRANSURETHRAL RESECTION OF BLADDER TUMOR  2002   UPPER GASTROINTESTINAL ENDOSCOPY     VIDEO BRONCHOSCOPY  04/11/2022   Procedure: VIDEO BRONCHOSCOPY;  Surgeon: Josephine Igo, DO;  Location: [facility];  Service: Cardiopulmonary;;   VIDEO BRONCHOSCOPY WITH ENDOBRONCHIAL NAVIGATION N/A 10/13/2019   Procedure: VIDEO BRONCHOSCOPY WITH ENDOBRONCHIAL NAVIGATION;  Surgeon: Josephine Igo, DO;  Location: [facility];  Service: Pulmonary;  Laterality: N/A;   WISDOM TOOTH EXTRACTION     XI ROBOTIC ASSISTED LOWER ANTERIOR RESECTION N/A 12/14/2021   Procedure: XI ROBOTIC ASSISTED LOWER ANTERIOR ULTRA LOW RECTOSIGMOID RESECTION, COLOANAL HAND SEWN ANASTOMOSIS, AND BILATERAL TAP BLOCK;  Surgeon: Karie Soda, MD;  Location: [facility];  Service: General;  Laterality: N/A;    REVIEW OF SYSTEMS:  Constitutional: positive for anorexia, fatigue, and weight loss Eyes: negative Ears, nose, mouth, throat, and face: negative Respiratory: positive for dyspnea on exertion Cardiovascular: negative Gastrointestinal: negative Genitourinary:negative Integument/breast: negative Hematologic/lymphatic: negative Musculoskeletal:positive for muscle weakness Neurological: negative Behavioral/Psych: negative Endocrine: negative Allergic/Immunologic: negative   PHYSICAL EXAMINATION: General appearance: alert, cooperative, fatigued, and no distress Head: Normocephalic, without obvious abnormality, atraumatic Neck: no adenopathy, no JVD, supple, symmetrical, trachea midline, and thyroid not enlarged, symmetric, no tenderness/mass/nodules Lymph nodes: Cervical, supraclavicular, and axillary nodes normal. Resp: clear to auscultation bilaterally Back: symmetric, no curvature. ROM normal. No CVA tenderness. Cardio: regular rate and rhythm, S1, S2 normal, no murmur, click, rub or gallop GI: soft, non-tender; bowel sounds normal; no  masses,  no organomegaly Extremities: extremities normal, atraumatic, no cyanosis or edema Neurologic: Alert and oriented X 3, normal strength and tone. Normal symmetric reflexes. Normal coordination and gait  ECOG PERFORMANCE STATUS: 1 - Symptomatic but completely ambulatory  Blood pressure 112/63, pulse 69, temperature 97.8 F (36.6 C), temperature source Tympanic, resp. rate 17, weight 149 lb (67.6 kg), SpO2 99 %.  LABORATORY DATA: Lab Results  Component Value Date   WBC 5.4 04/03/2022   HGB 9.9 (L) 04/03/2022   HCT 29.5 (L) 04/03/2022   MCV 98.7 04/03/2022   PLT 240 04/03/2022      Chemistry      Component Value Date/Time   NA 142 04/03/2022 0847   K 4.2 04/03/2022 0847   CL 108 04/03/2022 0847   CO2 26 04/03/2022 0847   BUN 17  04/03/2022 0847   CREATININE 0.81 04/03/2022 0847      Component Value Date/Time   CALCIUM 8.9 04/03/2022 0847   ALKPHOS 69 04/03/2022 0847   AST 17 04/03/2022 0847   ALT 15 04/03/2022 0847   BILITOT 0.6 04/03/2022 0847       RADIOGRAPHIC STUDIES: CT ABDOMEN PELVIS W CONTRAST  Result Date: 04/26/2022 CLINICAL DATA:  Non-small cell left upper lobe lung cancer; rectal GI stromal tumor status post neoadjuvant therapy and rectosigmoid resection, subsequent colo-anastomosis and diverting loop ileostomy, status post ileostomy closure. Restaging assessment. * Tracking Code: BO * EXAM: CT ABDOMEN AND PELVIS WITH CONTRAST TECHNIQUE: Multidetector CT imaging of the abdomen and pelvis was performed using the standard protocol following bolus administration of intravenous contrast. RADIATION DOSE REDUCTION: This exam was performed according to the departmental dose-optimization program which includes automated exposure control, adjustment of the mA and/or kV according to patient size and/or use of iterative reconstruction technique. CONTRAST:  49mL OMNIPAQUE IOHEXOL 350 MG/ML SOLN COMPARISON:  PET-CT 02/02/2022 FINDINGS: Lower chest: 2.4 by 1.7 cm primarily  cystic mixed density right lower lobe lung nodule similar to the 02/02/2022 exam, with bandlike nodularity along its periphery which has increased in comparison to the prior exam of 03/24/2021. The lesion was somewhat blurred on 02/02/2022 but appears otherwise roughly stable. Descending thoracic aortic and right coronary artery atherosclerotic vascular disease. Hepatobiliary: Unremarkable Pancreas: Unremarkable Spleen: Unremarkable Adrenals/Urinary Tract: Nonobstructive 4 mm in long axis right mid kidney calculus. Punctate 2 mm left kidney upper pole and lower pole renal calculi no appreciable ureteral or bladder calculus. Stomach/Bowel: Ileal anastomotic site in the central abdomen noted, a small amount of gas and contrast along the anastomotic staple line for example on images 50 through 58 of series 6, likely incidental and appearing contained locally. There is some wall thickening in the bowel at this anastomotic site. No discrete abscess. Vascular/Lymphatic: Atherosclerosis is present, including aortoiliac atherosclerotic disease. No pathologic adenopathy in the abdomen/pelvis. Reproductive: Prostatomegaly with prominent median lobe indenting the bladder base. Other: Trace presacral edema, nonspecific. Musculoskeletal: Bridging spurring of the sacroiliac joints bilaterally. Mild dextroconvex lumbar scoliosis. IMPRESSION: 1. No findings of active malignancy in the abdomen/pelvis. 2. 2.4 by 1.7 cm primarily cystic mixed density right lower lobe lung nodule, similar to 02/02/2022 exam, with bandlike nodularity along its periphery which has increased in comparison to the prior exam of 03/24/2021. Low-grade adenocarcinoma is not excluded and surveillance the dedicated chest CT is recommended, likely in the context of the patient's other thoracic malignancy. 3. Ileal anastomotic site in the central abdomen noted, a small amount of gas and contrast along the anastomotic staple line, likely incidental and appearing  locally contained, suggesting that it is likely intraluminal. There is some wall thickening in the bowel at this anastomotic site. No discrete abscess. 4. Bilateral nonobstructive nephrolithiasis. 5. Prostatomegaly with prominent median lobe indenting the bladder base. 6. Aortic atherosclerosis. Aortic Atherosclerosis (ICD10-I70.0). Electronically Signed   By: Gaylyn Rong M.D.   On: 04/26/2022 14:29    ASSESSMENT AND PLAN: This is a very pleasant 72 years old white male recently diagnosed with a stage IIb (T1 a, N1, M0) non-small cell lung cancer, adenocarcinoma in June 2021 status post left upper lobectomy with lymph node dissection on December 16, 2019 under the care of Dr. Cliffton Asters.  The patient has no actionable mutations and PD-L1 expression was 1%. He was also diagnosed with pelvic GIST tumor. The patient completed a course of adjuvant systemic chemotherapy with cisplatin  75 mg/M2 and Alimta 500 mg/M2 every 3 weeks status post 4 cycles.  He tolerated his treatment well except for fatigue and occasional nausea. For the pelvic GIST tumor, the patient completed neoadjuvant treatment with imatinib 400 mg p.o. daily in April 2023.  Status post 18 months.   The patient underwent surgical resection of the remaining gastrointestinal stromal tumor under the care of Dr. Michaell Cowing on December 14, 2021 with very minimal residual disease.  He resumed his treatment with imatinib and tolerating it fairly well. Repeat CT scan of the chest for restaging of his lung cancer. There was increased density of soft tissue in the left hilar region concerning for possible developing lymphadenopathy or disease recurrence.  A PET scan was performed recently and that showed new hypermetabolic left axillary, mediastinal and left hilar adenopathy compatible with recurrent metastatic lung cancer no hypermetabolic metastatic disease in the neck, abdomen, pelvis or skeleton. He underwent excisional biopsy of the left axillary lymph  node that was not conclusive for malignancy. The patient was seen by Dr. Tonia Brooms and repeat video bronchoscopy with EBUS on April 11, 2022 and biopsy from the station 7 lymph node showed recurrent adenocarcinoma. I had a lengthy discussion with the patient today about his current condition and treatment options. I recommended for the patient a course of concurrent chemoradiation with weekly carboplatin for AUC of 2 and paclitaxel 45 Mg/M2 for 6-7 weeks. I discussed with the patient the adverse effect of this treatment including but not limited to alopecia, myelosuppression, nausea and vomiting, peripheral neuropathy, liver or renal dysfunction. The patient is interested in proceeding with the treatment. I will refer him to radiation oncology for discussion of the radiotherapy option of his treatment. For the weight loss, I will start him on Megace ES 625 mg p.o. daily. The patient was advised about the risk of deep venous thrombosis and pulmonary embolism on this treatment. I will also repeat CT scan of the chest for restaging of his disease before starting the first dose of his concurrent chemoradiation. The patient is expected to start the first dose of this treatment on May 14, 2022. He will come back for follow-up visit at that time. For the GIST, the patient will continue on his treatment with imatinib 400 mg p.o. daily for now. He was advised to call immediately if he has any other concerning symptoms in the interval.  The patient voices understanding of current disease status and treatment options and is in agreement with the current care plan.  All questions were answered. The patient knows to call the clinic with any problems, questions or concerns. We can certainly see the patient much sooner if necessary. The total time spent in the appointment was 35 minutes.  Disclaimer: This note was dictated with voice recognition software. Similar sounding words can inadvertently be  transcribed and may not be corrected upon review.

## 2022-05-01 ENCOUNTER — Other Ambulatory Visit: Payer: Self-pay

## 2022-05-02 ENCOUNTER — Ambulatory Visit: Payer: Medicare Other | Admitting: Internal Medicine

## 2022-05-02 NOTE — Progress Notes (Signed)
Thoracic Location of Tumor / Histology:  Recurrent non-small cell lung cancer, adenocarcinoma with positive mediastinal lymph node   CT Chest w/ Contrast  05/03/2022 Progression of lymph nodes in the hilum, mediastinum and left axillary region.  Stable surgical changes from lobectomy on the left side. Slight increasing such as thickening at the hilum involving bronchi centrally.  Multiple small lung nodules in the right side, unchanged from previous. Recommend continued surveillance.  CT A/P w/ Contrast 04/26/2022 --IMPRESSION: No findings of active malignancy in the abdomen/pelvis. 2.4 by 1.7 cm primarily cystic mixed density right lower lobe lung nodule, similar to 02/02/2022 exam, with bandlike nodularity along its periphery which has increased in comparison to the prior exam of 03/24/2021. Low-grade adenocarcinoma is not excluded and surveillance the dedicated chest CT is recommended, likely in the context of the patient's other thoracic malignancy. Ileal anastomotic site in the central abdomen noted, a small amount of gas and contrast along the anastomotic staple line, likely incidental and appearing locally contained, suggesting that it is likely intraluminal. There is some wall thickening in the bowel at this anastomotic site. No discrete abscess. Bilateral nonobstructive nephrolithiasis. Prostatomegaly with prominent median lobe indenting the bladder base. Aortic atherosclerosis.  Biopsies revealed:  04/11/2022 A. LYMPH NODE, STATION 7, FINE NEEDLE ASPIRATION:  FINAL MICROSCOPIC DIAGNOSIS:  - Malignant cells present  - See comment  DIAGNOSTIC COMMENTS:  The malignant cells are positive with TTF-1 and negative with p40, Napsin A, synaptophysin and CD56.  The TTF-1 positivity is consistent with primary lung poorly differentiated carcinoma and favors poorly differentiated adenocarcinoma.  There is relatively scant cellularity in  the cellblock and likely not sufficient quantity for  molecular testing.  ADDENDUM:  Additional immunohistochemistry shows patchy positivity with MOC-31 and negative staining with chromogranin.  Ki-67 shows increased proliferation rate.  These findings also suggest adenocarcinoma.   Tobacco/Marijuana/Snuff/ETOH use: Former smoker, 0.5 pack/day for about 40 years (quit 09/2019). Reports occasional alcohol consumption. Denies any current recreational drug use, but has smoked marijuana in the past  Past/Anticipated interventions by cardiothoracic surgery, if any:  04/11/2022 --Dr. Elige Radon Icard  Flexible video fiberoptic bronchoscopy with endobronchial ultrasound and biopsies   12/16/2019 --Dr. Brynda Greathouse Robotic assisted left video thoracoscopy Left upper lobectomy Mediastinal lymph node sampling Intercostal nerve block  10/13/2019 --Dr. Elige Radon Icard Video Bronchoscopy with Electromagnetic Navigation - Guided Biopsies   Past/Anticipated interventions by medical oncology, if any:  Under care of Dr. Si Gaul  04/30/18 --CURRENT THERAPY:  Concurrent chemoradiation with weekly carboplatin for AUC of 2 and paclitaxel 45 Mg/M2.   First dose May 14, 2022. Imatinib 400 mg p.o. daily --PRIOR THERAPY:  Status post left upper lobectomy with lymph node dissection on December 16, 2019 under the care of Dr. Cliffton Asters.   The tumor size measured 0.9 cm but there was involvement of the level 10 L and 11 L.  Adjuvant systemic chemotherapy with cisplatin 75 mg/M2 and Alimta 500 mg/M2 every 3 weeks.   First dose February 04, 2020.   Status post 4 cycles. Neoadjuvant treatment with imatinib 400 mg p.o. daily for the GIST tumor of the pelvic area.  First dose started July 18, 2020.   Status post 12 months of treatment. Status post robotic assisted very low anterior rectosigmoid resection with coloanal anastomosis, diverting loop ileostomy and wedge liver biopsy under the care of Dr. Michaell Cowing on December 14, 2021 and it showed minimal residual  gastrointestinal stromal tumor.   Signs/Symptoms Weight changes, if any: He reports since having  his ileostomy he lost about 30 pounds, he is starting to gain it back. Respiratory complaints, if any: Denies respiratory changes. Hemoptysis, if any: Denies cough. Pain issues, if any:  Denies chest pain, pressure or tightness.  SAFETY ISSUES: Prior radiation? No Pacemaker/ICD? No  Possible current pregnancy? N/A Is the patient on methotrexate? No  Current Complaints / other details:

## 2022-05-03 ENCOUNTER — Ambulatory Visit (HOSPITAL_COMMUNITY)
Admission: RE | Admit: 2022-05-03 | Discharge: 2022-05-03 | Disposition: A | Discharge status: Home / Self Care | Payer: Medicare Other | Source: Ambulatory Visit | Attending: Internal Medicine | Admitting: Internal Medicine

## 2022-05-03 ENCOUNTER — Encounter (HOSPITAL_COMMUNITY): Payer: Self-pay

## 2022-05-03 ENCOUNTER — Telehealth: Payer: Self-pay | Admitting: Internal Medicine

## 2022-05-03 DIAGNOSIS — C3490 Malignant neoplasm of unspecified part of unspecified bronchus or lung: Secondary | ICD-10-CM | POA: Diagnosis not present

## 2022-05-03 DIAGNOSIS — J439 Emphysema, unspecified: Secondary | ICD-10-CM | POA: Diagnosis not present

## 2022-05-03 MED ORDER — IOHEXOL 300 MG/ML  SOLN
75.0000 mL | Freq: Once | INTRAMUSCULAR | Status: AC | PRN
  Administered 2022-05-03: 75 mL via INTRAVENOUS

## 2022-05-03 MED ORDER — IOHEXOL 350 MG/ML SOLN: 75.0000 mL | Freq: Once | INTRAVENOUS | Status: DC | PRN

## 2022-05-03 NOTE — Telephone Encounter (Signed)
Scheduled per 01/15 los and work-queue, patient has been called and notified of upcoming appointments.

## 2022-05-06 NOTE — Progress Notes (Signed)
Radiation Oncology         [phone] ________________________________  Initial Outpatient Consultation  Name: Jesse [name] MRN: [id number]  Date: 05/07/2022  DOB: Jul 15, 1950  SA:TFEVX, Bettey Mare, MD  Si Gaul, MD   REFERRING PHYSICIAN: Si Gaul, MD  DIAGNOSIS: The encounter diagnosis was Recurrent non-small cell lung cancer [facility]).  Recurrent non-small cell lung cancer, adenocarcinoma with positive mediastinal lymph node     Cancer Staging  Adenocarcinoma of left lung, stage 2 (HCC) Staging form: Lung, AJCC 8th Edition - Clinical: Stage IIB (cT1a, cN1, cM0) - Signed by Si Gaul, MD on 05/30/2020  Recurrent non-small cell lung cancer (HCC) Staging form: Lung, AJCC 8th Edition - Clinical: cT0, cN2, cM0 - Signed by Si Gaul, MD on 04/30/2022  HISTORY OF PRESENT ILLNESS::Jesse [name] is a 72 y.o. male who is seen as a courtesy of Dr. Arbutus Ped for an opinion concerning radiation therapy as part of management for his recently diagnosed recurrent lung cancer.   The patient was initially diagnosed with left lung adenocarcinoma in June 2021, s/p: left upper lobectomy with lymph node dissection on 12/16/2019 with Dr. Cliffton Asters, followed by adjuvant systemic chemotherapy with cisplatin x 4 cycles completed in late 2021. 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 Michaell Cowing on 12/14/2021. Wedge liver biopsy collected at the time of this procedure showed minimal residual gastrointestinal stromal tumor. He resumed imatinib following surgery which is continues to tolerate well.   In recent history, the patient presented for a follow up chest CT with contrast on 01/19/22 which demonstrated: an increase in conspicuity of soft tissue in the left hilar region  concerning for possible developing lymphadenopathy, and a mixed cystic and solid lesion in the periphery of the right lower lobe with slight enlargement of the small nodular components, measuring up to 6 mm.   CT findings prompted a restaging PET scan on 02/02/22 which demonstrated: new hypermetabolic left axillary, mediastinal and left hilar lymphadenopathy compatible with recurrent metastatic lung cancer, and a non-hypermetabolic mixed cystic and solid 2.2 cm peripheral right lower lobe pulmonary nodule noted her recent CT scan. Given the nodules notable progression on imaging studies, low-grade adenocarcinoma can not be excluded. Other findings of potential clinical significance included: nonspecific mild FDG uptake at the colo-anal anastomosis without discrete mass (favoring postsurgical changes), a hypodense non hypermetabolic 1.9 cm left thyroid nodule, and a stable small left adrenal adenoma, for which no follow-up is recommended.   Accordingly, the patient underwent left axillary nodal biopsies on 02/20/22. Pathology showed lymphoid tissue with overall nonspecific architectural changes, histiocytosis, and rare large atypical cells. Flow cytometry also showed no immunophenotypic evidence of a lymphoproliferative disorder (i.e. no monoclonal B cells or immunophenotypically abnormal T cells detected).   The patient was then referred to Dr. Luisa Hart for consideration of a seed localized lymph node biopsy to obtain adequate diagnostic information.   Radioactive seed guided left sentinel lymph node biopsy on 03/20/22 showed findings consistent with a benign/reactive lymph nodes with paracortical and mild follicular hyperplasia. Flow cytometry again showed no immunophenotypic evidence of a lymphoproliferative disorder (i.e. no monoclonal B cells or immunophenotypically abnormal T cells detected).   The patient also underwent open takedown of ileostomy by Dr. Michaell Cowing on 03/22/22. During a follow-up visit with  Dr. Arbutus Ped on 04/03/22,  the patient reported feeling fatigued and weak since surgery. His hemoglobin got as low as 7.8 while inpatient and he was given an iron infusion in the hospital with Ferrlecit for (1 dose on 12/11).    Given persistent concern for recurrent lung cancer despite negative work-up, the patient was referred to Dr. Tonia Brooms for bronchoscopy and biopsies of the mediastinal nodes. FNA of a station 7 lymph node collected on 04/11/22 showed malignant cells present consistent with primary poorly differentiated carcinoma, favoring poorly differentiated adenocarcinoma.   Accordingly, the patient followed up with Dr. Arbutus Ped on 04/30/21 to discuss treatment options. Following a detailed discussion with Dr. Arbutus Ped, the patient has consented to proceed with concurrent chemoradiation with weekly carboplatin for 6-7 weeks.   Pertinent imaging thus far includes:  -- CT of the abdomen and pelvis on 04/26/22 which demonstrated no definite findings of active malignancy in the abdomen/pelvis. However, the known RLL was re-demonstrated and remains potentially concerning for a low-grade adenocarcinoma given its slow increase in size since imaging performed on 03/24/21.   -- Chest CT with contrast on 05/03/21 redemonstrated progression of lymph nodes in the hilum, mediastinum and left axillary region. CT also showed stable surgical changes from left lobectomy, a slight increase in thickening at the hilum involving bronchi centrally, and multiple stable small lung nodules on the right side.   PREVIOUS RADIATION THERAPY: No  PAST MEDICAL HISTORY:  Past Medical History:  Diagnosis Date   Anemia    Cancer (HCC)    bladder cancer   COPD (chronic obstructive pulmonary disease) (HCC)    mild    DIVERTICULOSIS, COLON 01/14/2007   Qualifier: Diagnosis of  By: Marcelyn Ditty RN, Katy Fitch    GASTRITIS, CHRONIC 06/01/2004   Qualifier: Diagnosis of  By: Creta Levin CMA (AAMA), Robin     GERD 01/09/2007   Qualifier:  Diagnosis of  By: Tawanna Cooler, RN, Ellen     Heart murmur    as a child; no murmur heard 12/14/19   HYPERLIPIDEMIA 01/09/2007   Qualifier: Diagnosis of  By: Tawanna Cooler RN, Alvino Chapel     Hypertension    Lung mass    left upper nodule   Macular degeneration    right eye   NEOPLASM, MALIGNANT, BLADDER, HX OF 2002   Qualifier: Diagnosis of  By: Creta Levin CMA (AAMA), Robin  / no chemo or radiation   OSTEOARTHRITIS 01/14/2007   Qualifier: Diagnosis of  By: Marcelyn Ditty, RN, Katy Fitch - knees   Rectal mass    Reflux esophagitis 06/01/2004   Qualifier: Diagnosis of  By: Creta Levin CMA (AAMA), Robin     TOBACCO USER 02/16/2009   Qualifier: Diagnosis of  By: Amador Cunas  MD, Janett Labella    VITAMIN D DEFICIENCY 06/03/2007   Qualifier: Diagnosis of  By: Amador Cunas  MD, Janett Labella     PAST SURGICAL HISTORY: Past Surgical History:  Procedure Laterality Date   BREAST BIOPSY Left 03/16/2022   [place] LT RADIOACTIVE SEED LOC 03/16/2022 [facility]   BRONCHIAL BIOPSY  10/13/2019   Procedure: BRONCHIAL BIOPSIES;  Surgeon: Josephine Igo, DO;  Location: [facility];  Service: Pulmonary;;   BRONCHIAL BRUSHINGS  10/13/2019   Procedure: BRONCHIAL BRUSHINGS;  Surgeon: Josephine Igo, DO;  Location: [facility];  Service: Pulmonary;;   BRONCHIAL NEEDLE ASPIRATION BIOPSY  10/13/2019   Procedure: BRONCHIAL NEEDLE ASPIRATION BIOPSIES;  Surgeon: Josephine Igo, DO;  Location: [facility];  Service: Pulmonary;;   BRONCHIAL NEEDLE ASPIRATION BIOPSY  04/11/2022   Procedure: BRONCHIAL NEEDLE ASPIRATION BIOPSIES;  Surgeon: Josephine Igo, DO;  Location: [facility];  Service: Cardiopulmonary;;   BRONCHIAL WASHINGS  10/13/2019   Procedure: BRONCHIAL WASHINGS;  Surgeon: Josephine Igo, DO;  Location: [facility];  Service: Pulmonary;;   COLONOSCOPY     multiple   DIVERTING ILEOSTOMY N/A 12/14/2021   Procedure: DIVERTING ILEOSTOMY;  Surgeon: Karie Soda, MD;  Location: [facility];  Service: General;  Laterality: N/A;   ENDOBRONCHIAL  ULTRASOUND Bilateral 04/11/2022   Procedure: ENDOBRONCHIAL ULTRASOUND;  Surgeon: Josephine Igo, DO;  Location: [facility];  Service: Cardiopulmonary;  Laterality: Bilateral;   ILEOSTOMY CLOSURE N/A 03/22/2022   Procedure: OPEN TAKEDOWN OF LOOP ILEOSTOMY;  Surgeon: Karie Soda, MD;  Location: [facility];  Service: General;  Laterality: N/A;  GEN w/ERAS PATHWAY LOCAL   INTERCOSTAL NERVE BLOCK Left 12/16/2019   Procedure: INTERCOSTAL NERVE BLOCK;  Surgeon: Corliss Skains, MD;  Location: [facility];  Service: Thoracic;  Laterality: Left;   KNEE SURGERY  07/20/2008   bilat.   NASAL SEPTUM SURGERY  1995   NODE DISSECTION Left 12/16/2019   Procedure: NODE DISSECTION;  Surgeon: Corliss Skains, MD;  Location: [facility];  Service: Thoracic;  Laterality: Left;   RADIOACTIVE SEED GUIDED AXILLARY SENTINEL LYMPH NODE Left 03/20/2022   Procedure: RADIOACTIVE SEED GUIDED LEFT AXILLARY SENTINEL LYMPH NODE BIOPSY;  Surgeon: Harriette Bouillon, MD;  Location: [facility];  Service: General;  Laterality: Left;   RECTAL EXAM UNDER ANESTHESIA N/A 03/22/2022   Procedure: ANORECTAL EXAMINATION UNDER ANESTHESIA;  Surgeon: Karie Soda, MD;  Location: [facility];  Service: General;  Laterality: N/A;   TONSILLECTOMY     TRANSURETHRAL RESECTION OF BLADDER TUMOR  2002   UPPER GASTROINTESTINAL ENDOSCOPY     VIDEO BRONCHOSCOPY  04/11/2022   Procedure: VIDEO BRONCHOSCOPY;  Surgeon: Josephine Igo, DO;  Location: [facility];  Service: Cardiopulmonary;;   VIDEO BRONCHOSCOPY WITH ENDOBRONCHIAL NAVIGATION N/A 10/13/2019   Procedure: VIDEO BRONCHOSCOPY WITH ENDOBRONCHIAL NAVIGATION;  Surgeon: Josephine Igo, DO;  Location: [facility];  Service: Pulmonary;  Laterality: N/A;   WISDOM TOOTH EXTRACTION     XI ROBOTIC ASSISTED LOWER ANTERIOR RESECTION N/A 12/14/2021   Procedure: XI ROBOTIC ASSISTED LOWER ANTERIOR ULTRA LOW RECTOSIGMOID RESECTION, COLOANAL HAND SEWN ANASTOMOSIS, AND BILATERAL TAP BLOCK;  Surgeon: Karie Soda, MD;   Location: [facility];  Service: General;  Laterality: N/A;    FAMILY HISTORY:  Family History  Problem Relation Age of Onset   Dementia Mother    Diabetes Father    Mental illness Father    Peripheral vascular disease Brother     SOCIAL HISTORY:  Social History   Tobacco Use   Smoking status: Former    Packs/day: 0.50    Years: 40.00    Total pack years: 20.00    Types: Cigarettes    Quit date: 09/21/2019    Years since quitting: 2.6   Smokeless tobacco: Never  Vaping Use   Vaping Use: Never used  Substance Use Topics   Alcohol use: Yes    Alcohol/week: 15.0 standard drinks of alcohol    Types: 15 Standard drinks or equivalent per week    Comment: wine/beer/martini   Drug use: Not Currently    Types: Marijuana    Comment: Smokes marijuana occasional, last use in 08/2019    ALLERGIES: No Known Allergies  MEDICATIONS:  Current Outpatient Medications  Medication Sig Dispense Refill   amLODipine (NORVASC) 5 MG tablet Take 1 tablet (5 mg total) by mouth daily. 90 tablet 3  atorvastatin (LIPITOR) 40 MG tablet Take 1/2 tablet (20 mg total) by mouth daily. 90 tablet 3   Cholecalciferol (VITAMIN D3 PO) Take 1 capsule by mouth daily.     diphenoxylate-atropine (LOMOTIL) 2.5-0.025 MG tablet Take 2 tablets by mouth 4 (four) times daily as needed for diarrhea or loose stools.     ferrous sulfate 325 (65 FE) MG EC tablet Take 1 tablet (325 mg total) by mouth 2 (two) times daily. 60 tablet 1   imatinib (GLEEVEC) 400 MG tablet Take 1 tablet (400 mg total) by mouth daily. Take with meals and large glass of water.Caution:Chemotherapy. (Patient taking differently: Take 400 mg by mouth at bedtime. Take with meals and large glass of water.Caution:Chemotherapy.) 30 tablet 3   loperamide (IMODIUM A-D) 2 MG tablet Take 4 mg by mouth 4 (four) times daily as needed for diarrhea or loose stools.     megestrol (MEGACE ES) 625 MG/5ML suspension Take 5 mLs (625 mg total) by mouth daily. 150 mL 0    Multiple Vitamins-Minerals (PRESERVISION AREDS 2) CAPS Take 1 capsule by mouth 2 (two) times daily.     omeprazole (PRILOSEC) 40 MG capsule Take 1 capsule (40 mg total) by mouth daily. 90 capsule 3   OVER THE COUNTER MEDICATION Take 3 tablets by mouth daily.  Balance of Nature Fruits     OVER THE COUNTER MEDICATION Take 3 tablets by mouth daily. Balance of Nature Vegetable     acetaminophen (TYLENOL) 500 MG tablet Take 1,000 mg by mouth every 8 (eight) hours as needed for moderate pain. (Patient not taking: Reported on 05/07/2022)     prochlorperazine (COMPAZINE) 10 MG tablet Take 1 tablet (10 mg total) by mouth every 6 (six) hours as needed for nausea or vomiting. (Patient not taking: Reported on 05/07/2022) 30 tablet 0   No current facility-administered medications for this encounter.    REVIEW OF SYSTEMS:  A 10+ POINT REVIEW OF SYSTEMS WAS OBTAINED including neurology, dermatology, psychiatry, cardiac, respiratory, lymph, extremities, GI, GU, musculoskeletal, constitutional, reproductive, HEENT.  He already reports some improvement in his appetite since starting Megace.  Does report frequent stools since takedown of his colostomy.  Reports 15-20 bowel movements per day which causes significant rectal irritation.  Denies any pain within the chest area significant cough or hemoptysis.   PHYSICAL EXAM:  height is 5\' 9"  (1.753 m) and weight is 147 lb 3.2 oz (66.8 kg). His oral temperature is 97.8 F (36.6 C). His blood pressure is 123/71 and his pulse is 81. His respiration is 16 and oxygen saturation is 100%.   General: Alert and oriented, in no acute distress HEENT: Head is normocephalic. Extraocular movements are intact. Neck: Neck is supple, no palpable cervical or supraclavicular lymphadenopathy. Heart: Regular in rate and rhythm with no murmurs, rubs, or gallops. Chest: Clear to auscultation bilaterally, with no rhonchi, wheezes, or rales. Abdomen: Soft, nontender, nondistended, with no  rigidity or guarding. Extremities: No cyanosis or edema. Lymphatics: see Neck Exam Skin: No concerning lesions. Musculoskeletal: symmetric strength and muscle tone throughout. Neurologic: Cranial nerves II through XII are grossly intact. No obvious focalities. Speech is fluent. Coordination is intact. Psychiatric: Judgment and insight are intact. Affect is appropriate.   ECOG = 1  0 - Asymptomatic (Fully active, able to carry on all predisease activities without restriction)  1 - Symptomatic but completely ambulatory (Restricted in physically strenuous activity but ambulatory and able to carry out work of a light or sedentary nature. For example, light housework,  office work)  2 - Symptomatic, <50% in bed during the day (Ambulatory and capable of all self care but unable to carry out any work activities. Up and about more than 50% of waking hours)  3 - Symptomatic, >50% in bed, but not bedbound (Capable of only limited self-care, confined to bed or chair 50% or more of waking hours)  4 - Bedbound (Completely disabled. Cannot carry on any self-care. Totally confined to bed or chair)  5 - Death   Santiago Glad MM, Creech RH, Tormey DC, et al. [id number]). "Toxicity and response criteria of the [facility]". Am. Evlyn Clines. Oncol. 5 (6): 649-55  LABORATORY DATA:  Lab Results  Component Value Date   WBC 5.4 04/03/2022   HGB 9.9 (L) 04/03/2022   HCT 29.5 (L) 04/03/2022   MCV 98.7 04/03/2022   PLT 240 04/03/2022   NEUTROABS 3.9 04/03/2022   Lab Results  Component Value Date   NA 142 04/03/2022   K 4.2 04/03/2022   CL 108 04/03/2022   CO2 26 04/03/2022   GLUCOSE 113 (H) 04/03/2022   BUN 17 04/03/2022   CREATININE 0.81 04/03/2022   CALCIUM 8.9 04/03/2022      RADIOGRAPHY: CT Chest W Contrast  Result Date: 05/03/2022 CLINICAL DATA:  Non-small-cell lung cancer.  * Tracking Code: BO * EXAM: CT CHEST WITH CONTRAST TECHNIQUE: Multidetector CT imaging of the chest was  performed during intravenous contrast administration. RADIATION DOSE REDUCTION: This exam was performed according to the departmental dose-optimization program which includes automated exposure control, adjustment of the mA and/or kV according to patient size and/or use of iterative reconstruction technique. CONTRAST:  20mL OMNIPAQUE IOHEXOL 300 MG/ML  SOLN COMPARISON:  CT scan 01/19/2022 and older.  PET-CT scan 02/02/2022 FINDINGS: Cardiovascular: Heart is nonenlarged. No pericardial effusion. Coronary artery calcifications are seen. The thoracic aorta is slightly ectatic along the ascending aorta. Diameter at the level of the right pulmonary artery at 3.9 cm. Mediastinum/Nodes: Small cystic nodule along the left-sided normal caliber thoracic esophagus. No abnormal lymph nodes in the right axillary region. There is a complex lymph node in the left axillary region. This has some central low-density and on image 39 of series 2 measures 3.8 x 2.3 cm. Previously this measured 11 x 10 mm. Small right hilar node identified, also new from previous measuring 15 by 13 mm on image 69. Left hilar node is also enlarging today. On image 57 this measures 20 x 14 mm and previously would have measured 18 by 10 mm. Soft tissue thickening in the hilum on the left is increasing such as axial image 66. Adjacent surgical changes from lobectomy. Prominent other mediastinal nodes are also seen including subcarinal which are similar to slightly enlarged and high right paratracheal. Many of these were hypermetabolic on the prior PET-CT as well. Mild mesenteric stranding identified. Lungs/Pleura: Trace left-sided pleural fluid is new. No pneumothorax. No right-sided effusion. No consolidation. Several small lung nodules are identified including right apical nodule on series 7, image 20 measuring 6 mm. 6 mm subpleural right lower lobe nodule stable on image 121 of series 7 when measured in same fashion as well. No new dominant nodules. Again  volume loss from the left-sided lobectomy. There is some thickening along bronchi in this location which is increasing from previous slightly. Upper Abdomen: Stable nodular thickening of the adrenal glands bilaterally. Wall thickening of the stomach again seen the stomach is contracted. Musculoskeletal: Scattered degenerative changes of the spine. Scattered sclerotic areas along  the thoracic spine, unchanged. Anasarca IMPRESSION: Progression of lymph nodes in the hilum, mediastinum and left axillary region. Stable surgical changes from lobectomy on the left side. Slight increasing such as thickening at the hilum involving bronchi centrally. Multiple small lung nodules in the right side, unchanged from previous. Recommend continued surveillance. Aortic Atherosclerosis (ICD10-I70.0) and Emphysema (ICD10-J43.9). Electronically Signed   By: Karen Kays M.D.   On: 05/03/2022 14:01   CT ABDOMEN PELVIS W CONTRAST  Result Date: 04/26/2022 CLINICAL DATA:  Non-small cell left upper lobe lung cancer; rectal GI stromal tumor status post neoadjuvant therapy and rectosigmoid resection, subsequent colo-anastomosis and diverting loop ileostomy, status post ileostomy closure. Restaging assessment. * Tracking Code: BO * EXAM: CT ABDOMEN AND PELVIS WITH CONTRAST TECHNIQUE: Multidetector CT imaging of the abdomen and pelvis was performed using the standard protocol following bolus administration of intravenous contrast. RADIATION DOSE REDUCTION: This exam was performed according to the departmental dose-optimization program which includes automated exposure control, adjustment of the mA and/or kV according to patient size and/or use of iterative reconstruction technique. CONTRAST:  45mL OMNIPAQUE IOHEXOL 350 MG/ML SOLN COMPARISON:  PET-CT 02/02/2022 FINDINGS: Lower chest: 2.4 by 1.7 cm primarily cystic mixed density right lower lobe lung nodule similar to the 02/02/2022 exam, with bandlike nodularity along its periphery which has  increased in comparison to the prior exam of 03/24/2021. The lesion was somewhat blurred on 02/02/2022 but appears otherwise roughly stable. Descending thoracic aortic and right coronary artery atherosclerotic vascular disease. Hepatobiliary: Unremarkable Pancreas: Unremarkable Spleen: Unremarkable Adrenals/Urinary Tract: Nonobstructive 4 mm in long axis right mid kidney calculus. Punctate 2 mm left kidney upper pole and lower pole renal calculi no appreciable ureteral or bladder calculus. Stomach/Bowel: Ileal anastomotic site in the central abdomen noted, a small amount of gas and contrast along the anastomotic staple line for example on images 50 through 58 of series 6, likely incidental and appearing contained locally. There is some wall thickening in the bowel at this anastomotic site. No discrete abscess. Vascular/Lymphatic: Atherosclerosis is present, including aortoiliac atherosclerotic disease. No pathologic adenopathy in the abdomen/pelvis. Reproductive: Prostatomegaly with prominent median lobe indenting the bladder base. Other: Trace presacral edema, nonspecific. Musculoskeletal: Bridging spurring of the sacroiliac joints bilaterally. Mild dextroconvex lumbar scoliosis. IMPRESSION: 1. No findings of active malignancy in the abdomen/pelvis. 2. 2.4 by 1.7 cm primarily cystic mixed density right lower lobe lung nodule, similar to 02/02/2022 exam, with bandlike nodularity along its periphery which has increased in comparison to the prior exam of 03/24/2021. Low-grade adenocarcinoma is not excluded and surveillance the dedicated chest CT is recommended, likely in the context of the patient's other thoracic malignancy. 3. Ileal anastomotic site in the central abdomen noted, a small amount of gas and contrast along the anastomotic staple line, likely incidental and appearing locally contained, suggesting that it is likely intraluminal. There is some wall thickening in the bowel at this anastomotic site. No  discrete abscess. 4. Bilateral nonobstructive nephrolithiasis. 5. Prostatomegaly with prominent median lobe indenting the bladder base. 6. Aortic atherosclerosis. Aortic Atherosclerosis (ICD10-I70.0). Electronically Signed   By: Gaylyn Rong M.D.   On: 04/26/2022 14:29      IMPRESSION: Recurrent non-small cell lung cancer, adenocarcinoma with positive mediastinal lymph node    He would be a good candidate for definitive course of radiation therapy along with radiosensitizing chemotherapy.  Treatment would be directed at the mediastinum and left hilar region.  Today, I talked to the patient  about the findings and work-up thus far.  We discussed the natural history of recurrent non-small cell lung cancer and general treatment, highlighting the role of radiotherapy in the management.  We discussed the available radiation techniques, and focused on the details of logistics and delivery.  We reviewed the anticipated acute and late sequelae associated with radiation in this setting.  The patient was encouraged to ask questions that I answered to the best of my ability.  A patient consent form was discussed and signed.  We retained a copy for our records.  The patient would like to proceed with radiation and will be scheduled for CT simulation.  PLAN: He will return for CT simulation tomorrow morning at 8 AM.  Treatments to begin January 30 concomitant with his radiosensitizing chemotherapy.  Anticipate 6 weeks of radiation therapy.   60 minutes of total time was spent for this patient encounter, including preparation, face-to-face counseling with the patient and coordination of care, physical exam, and documentation of the encounter.   ------------------------------------------------  Billie Lade, PhD, MD  This document serves as a record of services personally performed by Antony Blackbird, MD. It was created on his behalf by Neena Rhymes, a trained medical scribe. The creation of this record is  based on the scribe's personal observations and the provider's statements to them. This document has been checked and approved by the attending provider.

## 2022-05-07 ENCOUNTER — Ambulatory Visit
Admission: RE | Admit: 2022-05-07 | Discharge: 2022-05-07 | Disposition: A | Discharge status: Home / Self Care | Payer: Medicare Other | Source: Ambulatory Visit | Attending: Radiation Oncology | Admitting: Radiation Oncology

## 2022-05-07 ENCOUNTER — Encounter: Payer: Self-pay | Admitting: Radiation Oncology

## 2022-05-07 ENCOUNTER — Other Ambulatory Visit: Payer: Self-pay

## 2022-05-07 VITALS — BP 123/71 | HR 81 | Temp 97.8°F | Resp 16 | Ht 69.0 in | Wt 147.2 lb

## 2022-05-07 DIAGNOSIS — C3492 Malignant neoplasm of unspecified part of left bronchus or lung: Secondary | ICD-10-CM

## 2022-05-07 DIAGNOSIS — F17210 Nicotine dependence, cigarettes, uncomplicated: Secondary | ICD-10-CM | POA: Diagnosis not present

## 2022-05-07 DIAGNOSIS — C3490 Malignant neoplasm of unspecified part of unspecified bronchus or lung: Secondary | ICD-10-CM

## 2022-05-07 DIAGNOSIS — C3412 Malignant neoplasm of upper lobe, left bronchus or lung: Secondary | ICD-10-CM | POA: Diagnosis not present

## 2022-05-07 DIAGNOSIS — C771 Secondary and unspecified malignant neoplasm of intrathoracic lymph nodes: Secondary | ICD-10-CM | POA: Diagnosis not present

## 2022-05-07 NOTE — Progress Notes (Signed)
Pharmacist Chemotherapy Monitoring - Initial Assessment    Anticipated start date: 1.29.24    The following has been reviewed per standard work regarding the patient's treatment regimen: The patient's diagnosis, treatment plan and drug doses, and organ/hematologic function Lab orders and baseline tests specific to treatment regimen  The treatment plan start date, drug sequencing, and pre-medications Prior authorization status  Patient's documented medication list, including drug-drug interaction screen and prescriptions for anti-emetics and supportive care specific to the treatment regimen The drug concentrations, fluid compatibility, administration routes, and timing of the medications to be used The patient's access for treatment and lifetime cumulative dose history, if applicable  The patient's medication allergies and previous infusion related reactions, if applicable   Changes made to treatment plan:  N/A  Follow up needed:  N/A   Jesse [name], Jesse [name], RPH, 05/07/2022  1:29 PM

## 2022-05-08 ENCOUNTER — Ambulatory Visit
Admission: RE | Admit: 2022-05-08 | Discharge: 2022-05-08 | Disposition: A | Discharge status: Home / Self Care | Payer: Medicare Other | Source: Ambulatory Visit | Attending: Radiation Oncology | Admitting: Radiation Oncology

## 2022-05-08 DIAGNOSIS — C771 Secondary and unspecified malignant neoplasm of intrathoracic lymph nodes: Secondary | ICD-10-CM | POA: Insufficient documentation

## 2022-05-08 DIAGNOSIS — F17210 Nicotine dependence, cigarettes, uncomplicated: Secondary | ICD-10-CM | POA: Diagnosis not present

## 2022-05-08 DIAGNOSIS — Z510 Encounter for antineoplastic radiation therapy: Secondary | ICD-10-CM | POA: Insufficient documentation

## 2022-05-08 DIAGNOSIS — C3412 Malignant neoplasm of upper lobe, left bronchus or lung: Secondary | ICD-10-CM | POA: Insufficient documentation

## 2022-05-08 DIAGNOSIS — C3492 Malignant neoplasm of unspecified part of left bronchus or lung: Secondary | ICD-10-CM

## 2022-05-09 ENCOUNTER — Ambulatory Visit: Payer: Medicare Other | Admitting: Radiation Oncology

## 2022-05-09 ENCOUNTER — Ambulatory Visit: Payer: Medicare Other

## 2022-05-09 ENCOUNTER — Other Ambulatory Visit: Payer: Self-pay

## 2022-05-11 MED FILL — Dexamethasone Sodium Phosphate Inj 100 MG/10ML: INTRAMUSCULAR | Qty: 1 | Status: AC

## 2022-05-12 DIAGNOSIS — F17210 Nicotine dependence, cigarettes, uncomplicated: Secondary | ICD-10-CM | POA: Diagnosis not present

## 2022-05-12 DIAGNOSIS — C3412 Malignant neoplasm of upper lobe, left bronchus or lung: Secondary | ICD-10-CM | POA: Diagnosis not present

## 2022-05-12 DIAGNOSIS — Z510 Encounter for antineoplastic radiation therapy: Secondary | ICD-10-CM | POA: Diagnosis not present

## 2022-05-12 DIAGNOSIS — C771 Secondary and unspecified malignant neoplasm of intrathoracic lymph nodes: Secondary | ICD-10-CM | POA: Diagnosis not present

## 2022-05-14 ENCOUNTER — Inpatient Hospital Stay: Payer: Medicare Other

## 2022-05-14 ENCOUNTER — Inpatient Hospital Stay (HOSPITAL_BASED_OUTPATIENT_CLINIC_OR_DEPARTMENT_OTHER): Payer: Medicare Other | Admitting: Internal Medicine

## 2022-05-14 ENCOUNTER — Other Ambulatory Visit: Payer: Self-pay | Admitting: Internal Medicine

## 2022-05-14 VITALS — BP 135/68 | HR 65 | Temp 98.2°F | Resp 17

## 2022-05-14 DIAGNOSIS — Z902 Acquired absence of lung [part of]: Secondary | ICD-10-CM | POA: Diagnosis not present

## 2022-05-14 DIAGNOSIS — Z79899 Other long term (current) drug therapy: Secondary | ICD-10-CM | POA: Diagnosis not present

## 2022-05-14 DIAGNOSIS — C3492 Malignant neoplasm of unspecified part of left bronchus or lung: Secondary | ICD-10-CM

## 2022-05-14 DIAGNOSIS — C3490 Malignant neoplasm of unspecified part of unspecified bronchus or lung: Secondary | ICD-10-CM

## 2022-05-14 DIAGNOSIS — Z5111 Encounter for antineoplastic chemotherapy: Secondary | ICD-10-CM | POA: Diagnosis not present

## 2022-05-14 DIAGNOSIS — I10 Essential (primary) hypertension: Secondary | ICD-10-CM | POA: Diagnosis not present

## 2022-05-14 DIAGNOSIS — C3412 Malignant neoplasm of upper lobe, left bronchus or lung: Secondary | ICD-10-CM | POA: Diagnosis not present

## 2022-05-14 DIAGNOSIS — C771 Secondary and unspecified malignant neoplasm of intrathoracic lymph nodes: Secondary | ICD-10-CM | POA: Diagnosis not present

## 2022-05-14 DIAGNOSIS — C49A9 Gastrointestinal stromal tumor of other sites: Secondary | ICD-10-CM | POA: Diagnosis not present

## 2022-05-14 LAB — CMP (CANCER CENTER ONLY)
ALT: 19 U/L (ref 0–44)
AST: 18 U/L (ref 15–41)
Albumin: 3.8 g/dL (ref 3.5–5.0)
Alkaline Phosphatase: 79 U/L (ref 38–126)
Anion gap: 6 (ref 5–15)
BUN: 31 mg/dL — ABNORMAL HIGH (ref 8–23)
CO2: 24 mmol/L (ref 22–32)
Calcium: 9.3 mg/dL (ref 8.9–10.3)
Chloride: 110 mmol/L (ref 98–111)
Creatinine: 0.74 mg/dL (ref 0.61–1.24)
GFR, Estimated: >60 mL/min (ref >60)
Glucose, Bld: 91 mg/dL (ref 70–99)
Potassium: 3.6 mmol/L (ref 3.5–5.1)
Sodium: 140 mmol/L (ref 135–145)
Total Bilirubin: 0.4 mg/dL (ref 0.3–1.2)
Total Protein: 6.4 g/dL — ABNORMAL LOW (ref 6.5–8.1)

## 2022-05-14 LAB — CBC WITH DIFFERENTIAL (CANCER CENTER ONLY)
Abs Immature Granulocytes: 0.02 10*3/uL (ref 0.00–0.07)
Basophils Absolute: 0 10*3/uL (ref 0.0–0.1)
Basophils Relative: 1 %
Eosinophils Absolute: 0.1 10*3/uL (ref 0.0–0.5)
Eosinophils Relative: 1 %
HCT: 30.7 % — ABNORMAL LOW (ref 39.0–52.0)
Hemoglobin: 10.7 g/dL — ABNORMAL LOW (ref 13.0–17.0)
Immature Granulocytes: 0 %
Lymphocytes Relative: 23 %
Lymphs Abs: 1.5 10*3/uL (ref 0.7–4.0)
MCH: 32.8 pg (ref 26.0–34.0)
MCHC: 34.9 g/dL (ref 30.0–36.0)
MCV: 94.2 fL (ref 80.0–100.0)
Monocytes Absolute: 0.4 10*3/uL (ref 0.1–1.0)
Monocytes Relative: 6 %
Neutro Abs: 4.4 10*3/uL (ref 1.7–7.7)
Neutrophils Relative %: 69 %
Platelet Count: 169 10*3/uL (ref 150–400)
RBC: 3.26 MIL/uL — ABNORMAL LOW (ref 4.22–5.81)
RDW: 14.2 % (ref 11.5–15.5)
WBC Count: 6.4 10*3/uL (ref 4.0–10.5)
nRBC: 0 % (ref 0.0–0.2)

## 2022-05-14 MED ORDER — SODIUM CHLORIDE 0.9 % IV SOLN
10.0000 mg | Freq: Once | INTRAVENOUS | Status: AC
  Administered 2022-05-14: 10 mg via INTRAVENOUS
  Filled 2022-05-14: qty 10

## 2022-05-14 MED ORDER — SODIUM CHLORIDE 0.9 % IV SOLN: Freq: Once | INTRAVENOUS | Status: AC

## 2022-05-14 MED ORDER — SODIUM CHLORIDE 0.9 % IV SOLN
45.0000 mg/m2 | Freq: Once | INTRAVENOUS | Status: AC
  Administered 2022-05-14: 84 mg via INTRAVENOUS
  Filled 2022-05-14: qty 14

## 2022-05-14 MED ORDER — DIPHENHYDRAMINE HCL 50 MG/ML IJ SOLN
50.0000 mg | Freq: Once | INTRAMUSCULAR | Status: AC
  Administered 2022-05-14: 50 mg via INTRAVENOUS
  Filled 2022-05-14: qty 1

## 2022-05-14 MED ORDER — PALONOSETRON HCL INJECTION 0.25 MG/5ML
0.2500 mg | Freq: Once | INTRAVENOUS | Status: AC
  Administered 2022-05-14: 0.25 mg via INTRAVENOUS
  Filled 2022-05-14: qty 5

## 2022-05-14 MED ORDER — SODIUM CHLORIDE 0.9 % IV SOLN
177.6000 mg | Freq: Once | INTRAVENOUS | Status: AC
  Administered 2022-05-14: 180 mg via INTRAVENOUS
  Filled 2022-05-14: qty 18

## 2022-05-14 MED ORDER — FAMOTIDINE IN NACL 20-0.9 MG/50ML-% IV SOLN
20.0000 mg | Freq: Once | INTRAVENOUS | Status: AC
  Administered 2022-05-14: 20 mg via INTRAVENOUS
  Filled 2022-05-14: qty 50

## 2022-05-14 NOTE — Patient Instructions (Addendum)
[facility]  Discharge Instructions: Thank you for choosing [facility] to provide your oncology and hematology care.   If you have a lab appointment with the [facility], please go directly to the [facility] and check in at the registration area.   Wear comfortable clothing and clothing appropriate for easy access to any Portacath or PICC line.   We strive to give you quality time with your provider. You may need to reschedule your appointment if you arrive late (15 or more minutes).  Arriving late affects you and other patients whose appointments are after yours.  Also, if you miss three or more appointments without notifying the office, you may be dismissed from the clinic at the provider's discretion.      For prescription refill requests, have your pharmacy contact our office and allow 72 hours for refills to be completed.    Today you received the following chemotherapy and/or immunotherapy agents: Paclitaxel (Taxol) and Carboplatin.   To help prevent nausea and vomiting after your treatment, we encourage you to take your nausea medication as directed.  BELOW ARE SYMPTOMS THAT SHOULD BE REPORTED IMMEDIATELY: *FEVER GREATER THAN 100.4 F (38 C) OR HIGHER *CHILLS OR SWEATING *NAUSEA AND VOMITING THAT IS NOT CONTROLLED WITH YOUR NAUSEA MEDICATION *UNUSUAL SHORTNESS OF BREATH *UNUSUAL BRUISING OR BLEEDING *URINARY PROBLEMS (pain or burning when urinating, or frequent urination) *BOWEL PROBLEMS (unusual diarrhea, constipation, pain near the anus) TENDERNESS IN MOUTH AND THROAT WITH OR WITHOUT PRESENCE OF ULCERS (sore throat, sores in mouth, or a toothache) UNUSUAL RASH, SWELLING OR PAIN  UNUSUAL VAGINAL DISCHARGE OR ITCHING   Items with * indicate a potential emergency and should be followed up as soon as possible or go to the Emergency Department if any problems should occur.  Please show the CHEMOTHERAPY ALERT CARD or  IMMUNOTHERAPY ALERT CARD at check-in to the Emergency Department and triage nurse.  Should you have questions after your visit or need to cancel or reschedule your appointment, please contact [facility]  Dept: [phone]  and follow the prompts.  Office hours are 8:00 a.m. to 4:30 p.m. Monday - Friday. Please note that voicemails left after 4:00 p.m. may not be returned until the following business day.  We are closed weekends and major holidays. You have access to a nurse at all times for urgent questions. Please call the main number to the clinic Dept: [phone] and follow the prompts.   For any non-urgent questions, you may also contact your provider using MyChart. We now offer e-Visits for anyone 25 and older to request care online for non-urgent symptoms. For details visit [email or web address].   Also download the MyChart app! Go to the app store, search "MyChart", open the app, select [facility], and log in with your MyChart username and password.  Paclitaxel Injection What is this medication? PACLITAXEL (PAK li TAX el) treats some types of cancer. It works by slowing down the growth of cancer cells. This medicine may be used for other purposes; ask your health care provider or pharmacist if you have questions. COMMON BRAND NAME(S): Onxol, Taxol What should I tell my care team before I take this medication? They need to know if you have any of these conditions: Heart disease Liver disease Low white blood cell levels An unusual or allergic reaction to paclitaxel, other medications, foods, dyes, or preservatives If you or your partner are pregnant or trying to get  pregnant Breast-feeding How should I use this medication? This medication is injected into a vein. It is given by your care team in a hospital or clinic setting. Talk to your care team about the use of this medication in children. While it may be given to children for selected  conditions, precautions do apply. Overdosage: If you think you have taken too much of this medicine contact a poison control center or emergency room at once. NOTE: This medicine is only for you. Do not share this medicine with others. What if I miss a dose? Keep appointments for follow-up doses. It is important not to miss your dose. Call your care team if you are unable to keep an appointment. What may interact with this medication? Do not take this medication with any of the following: Live virus vaccines Other medications may affect the way this medication works. Talk with your care team about all of the medications you take. They may suggest changes to your treatment plan to lower the risk of side effects and to make sure your medications work as intended. This list may not describe all possible interactions. Give your health care provider a list of all the medicines, herbs, non-prescription drugs, or dietary supplements you use. Also tell them if you smoke, drink alcohol, or use illegal drugs. Some items may interact with your medicine. What should I watch for while using this medication? Your condition will be monitored carefully while you are receiving this medication. You may need blood work while taking this medication. This medication may make you feel generally unwell. This is not uncommon as chemotherapy can affect healthy cells as well as cancer cells. Report any side effects. Continue your course of treatment even though you feel ill unless your care team tells you to stop. This medication can cause serious allergic reactions. To reduce the risk, your care team may give you other medications to take before receiving this one. Be sure to follow the directions from your care team. This medication may increase your risk of getting an infection. Call your care team for advice if you get a fever, chills, sore throat, or other symptoms of a cold or flu. Do not treat yourself. Try to avoid  being around people who are sick. This medication may increase your risk to bruise or bleed. Call your care team if you notice any unusual bleeding. Be careful brushing or flossing your teeth or using a toothpick because you may get an infection or bleed more easily. If you have any dental work done, tell your dentist you are receiving this medication. Talk to your care team if you may be pregnant. Serious birth defects can occur if you take this medication during pregnancy. Talk to your care team before breastfeeding. Changes to your treatment plan may be needed. What side effects may I notice from receiving this medication? Side effects that you should report to your care team as soon as possible: Allergic reactions--skin rash, itching, hives, swelling of the face, lips, tongue, or throat Heart rhythm changes--fast or irregular heartbeat, dizziness, feeling faint or lightheaded, chest pain, trouble breathing Increase in blood pressure Infection--fever, chills, cough, sore throat, wounds that don't heal, pain or trouble when passing urine, general feeling of discomfort or being unwell Low blood pressure--dizziness, feeling faint or lightheaded, blurry vision Low red blood cell level--unusual weakness or fatigue, dizziness, headache, trouble breathing Painful swelling, warmth, or redness of the skin, blisters or sores at the infusion site Pain, tingling, or numbness in  the hands or feet Slow heartbeat--dizziness, feeling faint or lightheaded, confusion, trouble breathing, unusual weakness or fatigue Unusual bruising or bleeding Side effects that usually do not require medical attention (report to your care team if they continue or are bothersome): Diarrhea Hair loss Joint pain Loss of appetite Muscle pain Nausea Vomiting This list may not describe all possible side effects. Call your doctor for medical advice about side effects. You may report side effects to FDA at 1-800-FDA-1088. Where  should I keep my medication? This medication is given in a hospital or clinic. It will not be stored at home. NOTE: This sheet is a summary. It may not cover all possible information. If you have questions about this medicine, talk to your doctor, pharmacist, or health care provider.  2023 Elsevier/Gold Standard (2021-08-02 00:00:00) Carboplatin Injection What is this medication? CARBOPLATIN (KAR boe pla tin) treats some types of cancer. It works by slowing down the growth of cancer cells. This medicine may be used for other purposes; ask your health care provider or pharmacist if you have questions. COMMON BRAND NAME(S): Paraplatin What should I tell my care team before I take this medication? They need to know if you have any of these conditions: Blood disorders Hearing problems Kidney disease Recent or ongoing radiation therapy An unusual or allergic reaction to carboplatin, cisplatin, other medications, foods, dyes, or preservatives Pregnant or trying to get pregnant Breast-feeding How should I use this medication? This medication is injected into a vein. It is given by your care team in a hospital or clinic setting. Talk to your care team about the use of this medication in children. Special care may be needed. Overdosage: If you think you have taken too much of this medicine contact a poison control center or emergency room at once. NOTE: This medicine is only for you. Do not share this medicine with others. What if I miss a dose? Keep appointments for follow-up doses. It is important not to miss your dose. Call your care team if you are unable to keep an appointment. What may interact with this medication? Medications for seizures Some antibiotics, such as amikacin, gentamicin, neomycin, streptomycin, tobramycin Vaccines This list may not describe all possible interactions. Give your health care provider a list of all the medicines, herbs, non-prescription drugs, or dietary  supplements you use. Also tell them if you smoke, drink alcohol, or use illegal drugs. Some items may interact with your medicine. What should I watch for while using this medication? Your condition will be monitored carefully while you are receiving this medication. You may need blood work while taking this medication. This medication may make you feel generally unwell. This is not uncommon, as chemotherapy can affect healthy cells as well as cancer cells. Report any side effects. Continue your course of treatment even though you feel ill unless your care team tells you to stop. In some cases, you may be given additional medications to help with side effects. Follow all directions for their use. This medication may increase your risk of getting an infection. Call your care team for advice if you get a fever, chills, sore throat, or other symptoms of a cold or flu. Do not treat yourself. Try to avoid being around people who are sick. Avoid taking medications that contain aspirin, acetaminophen, ibuprofen, naproxen, or ketoprofen unless instructed by your care team. These medications may hide a fever. Be careful brushing or flossing your teeth or using a toothpick because you may get an infection or  bleed more easily. If you have any dental work done, tell your dentist you are receiving this medication. Talk to your care team if you wish to become pregnant or think you might be pregnant. This medication can cause serious birth defects. Talk to your care team about effective forms of contraception. Do not breast-feed while taking this medication. What side effects may I notice from receiving this medication? Side effects that you should report to your care team as soon as possible: Allergic reactions--skin rash, itching, hives, swelling of the face, lips, tongue, or throat Infection--fever, chills, cough, sore throat, wounds that don't heal, pain or trouble when passing urine, general feeling of  discomfort or being unwell Low red blood cell level--unusual weakness or fatigue, dizziness, headache, trouble breathing Pain, tingling, or numbness in the hands or feet, muscle weakness, change in vision, confusion or trouble speaking, loss of balance or coordination, trouble walking, seizures Unusual bruising or bleeding Side effects that usually do not require medical attention (report to your care team if they continue or are bothersome): Hair loss Nausea Unusual weakness or fatigue Vomiting This list may not describe all possible side effects. Call your doctor for medical advice about side effects. You may report side effects to FDA at 1-800-FDA-1088. Where should I keep my medication? This medication is given in a hospital or clinic. It will not be stored at home. NOTE: This sheet is a summary. It may not cover all possible information. If you have questions about this medicine, talk to your doctor, pharmacist, or health care provider.  2023 Elsevier/Gold Standard (2021-07-17 00:00:00)

## 2022-05-14 NOTE — Progress Notes (Signed)
[facility] Telephone:([phone]) [phone]   [phone]  PROGRESS NOTE FOR TELEMEDICINE VISITS  Jesse Ruddy, MD [place] [address]  I connected withNAME@ on 05/14/22 at  9:45 AM EST by video enabled telemedicine visit and verified that I am speaking with the correct person using two identifiers.   I discussed the limitations, risks, security and privacy concerns of performing an evaluation and management service by telemedicine and the availability of in-person appointments. I also discussed with the patient that there may be a patient responsible charge related to this service. The patient expressed understanding and agreed to proceed.  Other persons participating in the visit and their role in the encounter:  None  Patient's location: [facility] exam room Provider's location: [place] office  DIAGNOSIS:  1) recurrent non-small cell lung cancer, adenocarcinoma with positive mediastinal lymph node in December 2023 that was initially diagnosed as stage IIB (T1 a, N1, M0) non-small cell lung cancer, adenocarcinoma diagnosed in June 2021. 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   Biomarker Findings Tumor Mutational Burden - 20 Muts/Mb Microsatellite status - MS-Stable Genomic Findings For a complete list of the genes assayed, please refer to the Appendix. KEAP1 I46fs*17 YIFO2D X412* RB1 splice site 8786+7E>H MC94 R209* 8 Disease relevant genes with no reportable alterations: ALK, BRAF, EGFR, ERBB2, KRAS, MET, RET, ROS1   PDL1 Expression: 1 %   PRIOR THERAPY:  1) status post left upper lobectomy with lymph node dissection on December 16, 2019 under the care of Dr. Kipp Brood.  The tumor size measured 0.9 cm but there was involvement of the level 10 L and 11 L.  2) Adjuvant systemic chemotherapy with cisplatin 75 mg/M2 and Alimta 500 mg/M2 every 3 weeks.  First dose February 04, 2020.  Status post 4  cycles. 3) Neoadjuvant treatment with imatinib 400 mg p.o. daily for the GIST tumor of the pelvic area.  First dose started July 18, 2020.  Status post 12 months of treatment. 4) status post robotic assisted very low anterior rectosigmoid resection with coloanal anastomosis, diverting loop ileostomy and wedge liver biopsy under the care of Dr. Johney Maine on December 14, 2021 and it showed minimal residual gastrointestinal stromal tumor.   CURRENT THERAPY:  1) concurrent chemoradiation with weekly carboplatin for AUC of 2 and paclitaxel 45 Mg/M2.  First dose May 14, 2022. 2) Imatinib 400 mg p.o. daily  INTERVAL HISTORY: Jesse [name] 72 y.o. male returns to the clinic today for follow-up visit.  The patient is feeling fine today with no concerning complaints except for the diarrhea from the colostomy.  He has an appointment with his surgeon next week for discussion of this persistent diarrhea.  He may need evaluation by gastroenterology.  He gained few pounds since starting Megace ES.  He denied having any significant chest pain, shortness of breath, cough or hemoptysis.  He has no nausea, vomiting, abdominal pain or constipation.  He has no headache or visual changes.  He is here today to start the first dose of his treatment with concurrent chemoradiation to the chest.  MEDICAL HISTORY: Past Medical History:  Diagnosis Date   Anemia    Cancer ([place])    bladder cancer   COPD (chronic obstructive pulmonary disease) ([facility])    mild    DIVERTICULOSIS, COLON 01/14/2007   Qualifier: Diagnosis of  By: Paulina Fusi RN, [name]    GASTRITIS, CHRONIC 06/01/2004   Qualifier: Diagnosis of  By:  Stallings CMA (AAMA), Robin     GERD 01/09/2007   Qualifier: Diagnosis of  By: Sherren Mocha, RN, Ellen     Heart murmur    as a child; no murmur heard 12/14/19   HYPERLIPIDEMIA 01/09/2007   Qualifier: Diagnosis of  By: Sherren Mocha RN, Dorian Pod     Hypertension    Lung mass    left upper nodule   Macular degeneration    right eye    NEOPLASM, MALIGNANT, BLADDER, HX OF 2002   Qualifier: Diagnosis of  By: Nolon Rod CMA (AAMA), Robin  / no chemo or radiation   OSTEOARTHRITIS 01/14/2007   Qualifier: Diagnosis of  By: Paulina Fusi, RN, [name] - knees   Rectal mass    Reflux esophagitis 06/01/2004   Qualifier: Diagnosis of  By: Nolon Rod CMA (AAMA), Robin     TOBACCO USER 02/16/2009   Qualifier: Diagnosis of  By: Burnice Logan  MD, Doretha Sou    VITAMIN D DEFICIENCY 06/03/2007   Qualifier: Diagnosis of  By: Burnice Logan  MD, Doretha Sou     ALLERGIES:  has No Known Allergies.  MEDICATIONS:  Current Outpatient Medications  Medication Sig Dispense Refill   acetaminophen (TYLENOL) 500 MG tablet Take 1,000 mg by mouth every 8 (eight) hours as needed for moderate pain. (Patient not taking: Reported on 05/07/2022)     amLODipine (NORVASC) 5 MG tablet Take 1 tablet (5 mg total) by mouth daily. 90 tablet 3   atorvastatin (LIPITOR) 40 MG tablet Take 1/2 tablet (20 mg total) by mouth daily. 90 tablet 3   Cholecalciferol (VITAMIN D3 PO) Take 1 capsule by mouth daily.     diphenoxylate-atropine (LOMOTIL) 2.5-0.025 MG tablet Take 2 tablets by mouth 4 (four) times daily as needed for diarrhea or loose stools.     ferrous sulfate 325 (65 FE) MG EC tablet Take 1 tablet (325 mg total) by mouth 2 (two) times daily. 60 tablet 1   imatinib (GLEEVEC) 400 MG tablet Take 1 tablet (400 mg total) by mouth daily. Take with meals and large glass of water.Caution:Chemotherapy. (Patient taking differently: Take 400 mg by mouth at bedtime. Take with meals and large glass of water.Caution:Chemotherapy.) 30 tablet 3   loperamide (IMODIUM A-D) 2 MG tablet Take 4 mg by mouth 4 (four) times daily as needed for diarrhea or loose stools.     megestrol (MEGACE ES) 625 MG/5ML suspension Take 5 mLs (625 mg total) by mouth daily. 150 mL 0   Multiple Vitamins-Minerals (PRESERVISION AREDS 2) CAPS Take 1 capsule by mouth 2 (two) times daily.     omeprazole (PRILOSEC) 40 MG capsule  Take 1 capsule (40 mg total) by mouth daily. 90 capsule 3   OVER THE COUNTER MEDICATION Take 3 tablets by mouth daily.  Balance of Nature Fruits     OVER THE COUNTER MEDICATION Take 3 tablets by mouth daily. Balance of Nature Vegetable     prochlorperazine (COMPAZINE) 10 MG tablet Take 1 tablet (10 mg total) by mouth every 6 (six) hours as needed for nausea or vomiting. (Patient not taking: Reported on 05/07/2022) 30 tablet 0   No current facility-administered medications for this visit.    SURGICAL HISTORY:  Past Surgical History:  Procedure Laterality Date   BREAST BIOPSY Left 03/16/2022   [place] LT RADIOACTIVE SEED LOC 03/16/2022 [facility]   BRONCHIAL BIOPSY  10/13/2019   Procedure: BRONCHIAL BIOPSIES;  Surgeon: Garner Nash, DO;  Location: [facility];  Service: Pulmonary;;   BRONCHIAL BRUSHINGS  10/13/2019  Procedure: BRONCHIAL BRUSHINGS;  Surgeon: Garner Nash, DO;  Location: [facility];  Service: Pulmonary;;   BRONCHIAL NEEDLE ASPIRATION BIOPSY  10/13/2019   Procedure: BRONCHIAL NEEDLE ASPIRATION BIOPSIES;  Surgeon: Garner Nash, DO;  Location: [facility];  Service: Pulmonary;;   BRONCHIAL NEEDLE ASPIRATION BIOPSY  04/11/2022   Procedure: BRONCHIAL NEEDLE ASPIRATION BIOPSIES;  Surgeon: Garner Nash, DO;  Location: [facility];  Service: Cardiopulmonary;;   BRONCHIAL WASHINGS  10/13/2019   Procedure: BRONCHIAL WASHINGS;  Surgeon: Garner Nash, DO;  Location: [facility];  Service: Pulmonary;;   COLONOSCOPY     multiple   DIVERTING ILEOSTOMY N/A 12/14/2021   Procedure: DIVERTING ILEOSTOMY;  Surgeon: Michael Boston, MD;  Location: [facility];  Service: General;  Laterality: N/A;   ENDOBRONCHIAL ULTRASOUND Bilateral 04/11/2022   Procedure: ENDOBRONCHIAL ULTRASOUND;  Surgeon: Garner Nash, DO;  [facility];  Service: Cardiopulmonary;  Laterality: Bilateral;   ILEOSTOMY CLOSURE N/A 03/22/2022   Procedure: OPEN TAKEDOWN OF LOOP ILEOSTOMY;  Surgeon:  Michael Boston, MD;  Location[facility];  Service: General;  Laterality: N/A;  GEN w/ERAS PATHWAY LOCAL   INTERCOSTAL NERVE BLOCK Left 12/16/2019   Procedure: INTERCOSTAL NERVE BLOCK;  Surgeon: Lajuana Matte, MD;  Location: [facility];  Service: Thoracic;  Laterality: Left;   KNEE SURGERY  07/20/2008   bilat.   NASAL SEPTUM SURGERY  1995   NODE DISSECTION Left 12/16/2019   Procedure: NODE DISSECTION;  Surgeon: Lajuana Matte, MD;  Location: [facility];  Service: Thoracic;  Laterality: Left;   RADIOACTIVE SEED GUIDED AXILLARY SENTINEL LYMPH NODE Left 03/20/2022   Procedure: RADIOACTIVE SEED GUIDED LEFT AXILLARY SENTINEL LYMPH NODE BIOPSY;  Surgeon: Erroll Luna, MD;  Location: [facility];  Service: General;  Laterality: Left;   RECTAL EXAM UNDER ANESTHESIA N/A 03/22/2022   Procedure: ANORECTAL EXAMINATION UNDER ANESTHESIA;  Surgeon: Michael Boston, MD;  [facility];  Service: General;  Laterality: N/A;   TONSILLECTOMY     TRANSURETHRAL RESECTION OF BLADDER TUMOR  2002   UPPER GASTROINTESTINAL ENDOSCOPY     VIDEO BRONCHOSCOPY  04/11/2022   Procedure: VIDEO BRONCHOSCOPY;  Surgeon: Garner Nash, DO;  [facility];  Service: Cardiopulmonary;;   VIDEO BRONCHOSCOPY WITH ENDOBRONCHIAL NAVIGATION N/A 10/13/2019   Procedure: VIDEO BRONCHOSCOPY WITH ENDOBRONCHIAL NAVIGATION;  Surgeon: Garner Nash, DO;  Location: [facility];  Service: Pulmonary;  Laterality: N/A;   WISDOM TOOTH EXTRACTION     XI ROBOTIC ASSISTED LOWER ANTERIOR RESECTION N/A 12/14/2021   Procedure: XI ROBOTIC ASSISTED LOWER ANTERIOR ULTRA LOW RECTOSIGMOID RESECTION, COLOANAL HAND SEWN ANASTOMOSIS, AND BILATERAL TAP BLOCK;  Surgeon: Michael Boston, MD;  Location[facility];  Service: General;  Laterality: N/A;    REVIEW OF SYSTEMS:  A comprehensive review of systems was negative except for: Constitutional: positive for fatigue Gastrointestinal: positive for diarrhea   ECOG PERFORMANCE STATUS: 1 - Symptomatic but completely  ambulatory  Blood pressure 118/69, pulse 73, temperature 98.2 F (36.8 C), temperature source Temporal, resp. rate 17, height 5\' 9"  (1.753 m), weight 147 lb 6.4 oz (66.9 kg), SpO2 100 %.  LABORATORY DATA: Lab Results  Component Value Date   WBC 6.4 05/14/2022   HGB 10.7 (L) 05/14/2022   HCT 30.7 (L) 05/14/2022   MCV 94.2 05/14/2022   PLT 169 05/14/2022      Chemistry      Component Value Date/Time   NA 142 04/03/2022 0847   K 4.2 04/03/2022 0847   CL 108 04/03/2022 0847  CO2 26 04/03/2022 0847   BUN 17 04/03/2022 0847   CREATININE 0.81 04/03/2022 0847      Component Value Date/Time   CALCIUM 8.9 04/03/2022 0847   ALKPHOS 69 04/03/2022 0847   AST 17 04/03/2022 0847   ALT 15 04/03/2022 0847   BILITOT 0.6 04/03/2022 0847       RADIOGRAPHIC STUDIES: CT Chest W Contrast  Result Date: 05/03/2022 CLINICAL DATA:  Non-small-cell lung cancer.  * Tracking Code: BO * EXAM: CT CHEST WITH CONTRAST TECHNIQUE: Multidetector CT imaging of the chest was performed during intravenous contrast administration. RADIATION DOSE REDUCTION: This exam was performed according to the departmental dose-optimization program which includes automated exposure control, adjustment of the mA and/or kV according to patient size and/or use of iterative reconstruction technique. CONTRAST:  71mL OMNIPAQUE IOHEXOL 300 MG/ML  SOLN COMPARISON:  CT scan 01/19/2022 and older.  PET-CT scan 02/02/2022 FINDINGS: Cardiovascular: Heart is nonenlarged. No pericardial effusion. Coronary artery calcifications are seen. The thoracic aorta is slightly ectatic along the ascending aorta. Diameter at the level of the right pulmonary artery at 3.9 cm. Mediastinum/Nodes: Small cystic nodule along the left-sided normal caliber thoracic esophagus. No abnormal lymph nodes in the right axillary region. There is a complex lymph node in the left axillary region. This has some central low-density and on image 39 of series 2 measures 3.8 x 2.3  cm. Previously this measured 11 x 10 mm. Small right hilar node identified, also new from previous measuring 15 by 13 mm on image 69. Left hilar node is also enlarging today. On image 57 this measures 20 x 14 mm and previously would have measured 18 by 10 mm. Soft tissue thickening in the hilum on the left is increasing such as axial image 66. Adjacent surgical changes from lobectomy. Prominent other mediastinal nodes are also seen including subcarinal which are similar to slightly enlarged and high right paratracheal. Many of these were hypermetabolic on the prior PET-CT as well. Mild mesenteric stranding identified. Lungs/Pleura: Trace left-sided pleural fluid is new. No pneumothorax. No right-sided effusion. No consolidation. Several small lung nodules are identified including right apical nodule on series 7, image 20 measuring 6 mm. 6 mm subpleural right lower lobe nodule stable on image 121 of series 7 when measured in same fashion as well. No new dominant nodules. Again volume loss from the left-sided lobectomy. There is some thickening along bronchi in this location which is increasing from previous slightly. Upper Abdomen: Stable nodular thickening of the adrenal glands bilaterally. Wall thickening of the stomach again seen the stomach is contracted. Musculoskeletal: Scattered degenerative changes of the spine. Scattered sclerotic areas along the thoracic spine, unchanged. Anasarca IMPRESSION: Progression of lymph nodes in the hilum, mediastinum and left axillary region. Stable surgical changes from lobectomy on the left side. Slight increasing such as thickening at the hilum involving bronchi centrally. Multiple small lung nodules in the right side, unchanged from previous. Recommend continued surveillance. Aortic Atherosclerosis (ICD10-I70.0) and Emphysema (ICD10-J43.9). Electronically Signed   By: Jill Side M.D.   On: 05/03/2022 14:01   CT ABDOMEN PELVIS W CONTRAST  Result Date: 04/26/2022 CLINICAL  DATA:  Non-small cell left upper lobe lung cancer; rectal GI stromal tumor status post neoadjuvant therapy and rectosigmoid resection, subsequent colo-anastomosis and diverting loop ileostomy, status post ileostomy closure. Restaging assessment. * Tracking Code: BO * EXAM: CT ABDOMEN AND PELVIS WITH CONTRAST TECHNIQUE: Multidetector CT imaging of the abdomen and pelvis was performed using the standard protocol following bolus administration  of intravenous contrast. RADIATION DOSE REDUCTION: This exam was performed according to the departmental dose-optimization program which includes automated exposure control, adjustment of the mA and/or kV according to patient size and/or use of iterative reconstruction technique. CONTRAST:  17mL OMNIPAQUE IOHEXOL 350 MG/ML SOLN COMPARISON:  PET-CT 02/02/2022 FINDINGS: Lower chest: 2.4 by 1.7 cm primarily cystic mixed density right lower lobe lung nodule similar to the 02/02/2022 exam, with bandlike nodularity along its periphery which has increased in comparison to the prior exam of 03/24/2021. The lesion was somewhat blurred on 02/02/2022 but appears otherwise roughly stable. Descending thoracic aortic and right coronary artery atherosclerotic vascular disease. Hepatobiliary: Unremarkable Pancreas: Unremarkable Spleen: Unremarkable Adrenals/Urinary Tract: Nonobstructive 4 mm in long axis right mid kidney calculus. Punctate 2 mm left kidney upper pole and lower pole renal calculi no appreciable ureteral or bladder calculus. Stomach/Bowel: Ileal anastomotic site in the central abdomen noted, a small amount of gas and contrast along the anastomotic staple line for example on images 50 through 58 of series 6, likely incidental and appearing contained locally. There is some wall thickening in the bowel at this anastomotic site. No discrete abscess. Vascular/Lymphatic: Atherosclerosis is present, including aortoiliac atherosclerotic disease. No pathologic adenopathy in the  abdomen/pelvis. Reproductive: Prostatomegaly with prominent median lobe indenting the bladder base. Other: Trace presacral edema, nonspecific. Musculoskeletal: Bridging spurring of the sacroiliac joints bilaterally. Mild dextroconvex lumbar scoliosis. IMPRESSION: 1. No findings of active malignancy in the abdomen/pelvis. 2. 2.4 by 1.7 cm primarily cystic mixed density right lower lobe lung nodule, similar to 02/02/2022 exam, with bandlike nodularity along its periphery which has increased in comparison to the prior exam of 03/24/2021. Low-grade adenocarcinoma is not excluded and surveillance the dedicated chest CT is recommended, likely in the context of the patient's other thoracic malignancy. 3. Ileal anastomotic site in the central abdomen noted, a small amount of gas and contrast along the anastomotic staple line, likely incidental and appearing locally contained, suggesting that it is likely intraluminal. There is some wall thickening in the bowel at this anastomotic site. No discrete abscess. 4. Bilateral nonobstructive nephrolithiasis. 5. Prostatomegaly with prominent median lobe indenting the bladder base. 6. Aortic atherosclerosis. Aortic Atherosclerosis (ICD10-I70.0). Electronically Signed   By: Van Clines M.D.   On: 04/26/2022 14:29    ASSESSMENT AND PLAN: This is a very pleasant 72 years old white male recently diagnosed with a stage IIb (T1 a, N1, M0) non-small cell lung cancer, adenocarcinoma in June 2021 status post left upper lobectomy with lymph node dissection on December 16, 2019 under the care of Dr. Kipp Brood.  The patient has no actionable mutations and PD-L1 expression was 1%. He was also diagnosed with pelvic GIST tumor. The patient completed a course of adjuvant systemic chemotherapy with cisplatin 75 mg/M2 and Alimta 500 mg/M2 every 3 weeks status post 4 cycles.  He tolerated his treatment well except for fatigue and occasional nausea. For the pelvic GIST tumor, the patient  completed neoadjuvant treatment with imatinib 400 mg p.o. daily in April 2023.  Status post 18 months.   The patient underwent surgical resection of the remaining gastrointestinal stromal tumor under the care of Dr. Johney Maine on December 14, 2021 with very minimal residual disease.  He resumed his treatment with imatinib and tolerating it fairly well. Repeat CT scan of the chest for restaging of his lung cancer. There was increased density of soft tissue in the left hilar region concerning for possible developing lymphadenopathy or disease recurrence.  A PET scan was  performed recently and that showed new hypermetabolic left axillary, mediastinal and left hilar adenopathy compatible with recurrent metastatic lung cancer no hypermetabolic metastatic disease in the neck, abdomen, pelvis or skeleton. He underwent excisional biopsy of the left axillary lymph node that was not conclusive for malignancy. The patient was seen by Dr. Valeta Harms and repeat video bronchoscopy with EBUS on April 11, 2022 and biopsy from the station 7 lymph node showed recurrent adenocarcinoma. The patient is currently undergoing a course of concurrent chemoradiation with weekly carboplatin for AUC of 2 and paclitaxel 45 Mg/M2.  First dose May 14, 2022.  The patient is feeling fine today with no concerning complaints except for the diarrhea from the colostomy. I recommended for him to proceed with the first dose of his concurrent chemoradiation today as planned. I will see him back for follow-up visit in 2 weeks for evaluation before starting cycle #3. For the weight loss, he started gaining weight on Megace ES 625 mg p.o. daily and I recommended for the patient to continue with this treatment. For the GIST, he will continue his current treatment with imatinib 400 mg p.o. daily for now. The patient was advised to call immediately if he has any other concerning symptoms in the interval. I discussed the assessment and treatment plan with  the patient. The patient was provided an opportunity to ask questions and all were answered. The patient agreed with the plan and demonstrated an understanding of the instructions.   The patient was advised to call back or seek an in-person evaluation if the symptoms worsen or if the condition fails to improve as anticipated.  I provided 20 minutes of face-to-face video visit time during this encounter, and > 50% was spent counseling as documented under my assessment & plan.  Eilleen Kempf, MD 05/14/2022 9:49 AM  Disclaimer: This note was dictated with voice recognition software. Similar sounding words can inadvertently be transcribed and may not be corrected upon review.

## 2022-05-15 ENCOUNTER — Ambulatory Visit
Admission: RE | Admit: 2022-05-15 | Discharge: 2022-05-15 | Disposition: A | Discharge status: Home / Self Care | Payer: Medicare Other | Source: Ambulatory Visit | Attending: Radiation Oncology | Admitting: Radiation Oncology

## 2022-05-15 ENCOUNTER — Other Ambulatory Visit: Payer: Self-pay

## 2022-05-15 DIAGNOSIS — C771 Secondary and unspecified malignant neoplasm of intrathoracic lymph nodes: Secondary | ICD-10-CM | POA: Diagnosis not present

## 2022-05-15 DIAGNOSIS — Z510 Encounter for antineoplastic radiation therapy: Secondary | ICD-10-CM | POA: Diagnosis not present

## 2022-05-15 DIAGNOSIS — C3412 Malignant neoplasm of upper lobe, left bronchus or lung: Secondary | ICD-10-CM | POA: Diagnosis not present

## 2022-05-15 DIAGNOSIS — C3490 Malignant neoplasm of unspecified part of unspecified bronchus or lung: Secondary | ICD-10-CM

## 2022-05-15 DIAGNOSIS — F17210 Nicotine dependence, cigarettes, uncomplicated: Secondary | ICD-10-CM | POA: Diagnosis not present

## 2022-05-15 DIAGNOSIS — C3492 Malignant neoplasm of unspecified part of left bronchus or lung: Secondary | ICD-10-CM

## 2022-05-15 LAB — RAD ONC ARIA SESSION SUMMARY
Course Elapsed Days: 0
Plan Fractions Treated to Date: 1
Plan Prescribed Dose Per Fraction: 2 Gy
Plan Total Fractions Prescribed: 30
Plan Total Prescribed Dose: 60 Gy
Reference Point Dosage Given to Date: 2 Gy
Reference Point Session Dosage Given: 2 Gy
Session Number: 1

## 2022-05-15 MED ORDER — SONAFINE EX EMUL
1.0000 | Freq: Once | CUTANEOUS | Status: AC
  Administered 2022-05-15: 1 via TOPICAL

## 2022-05-16 ENCOUNTER — Other Ambulatory Visit: Payer: Self-pay

## 2022-05-16 ENCOUNTER — Ambulatory Visit
Admission: RE | Admit: 2022-05-16 | Discharge: 2022-05-16 | Disposition: A | Discharge status: Home / Self Care | Payer: Medicare Other | Source: Ambulatory Visit | Attending: Radiation Oncology | Admitting: Radiation Oncology

## 2022-05-16 DIAGNOSIS — C771 Secondary and unspecified malignant neoplasm of intrathoracic lymph nodes: Secondary | ICD-10-CM | POA: Diagnosis not present

## 2022-05-16 DIAGNOSIS — F17210 Nicotine dependence, cigarettes, uncomplicated: Secondary | ICD-10-CM | POA: Diagnosis not present

## 2022-05-16 DIAGNOSIS — C3412 Malignant neoplasm of upper lobe, left bronchus or lung: Secondary | ICD-10-CM | POA: Diagnosis not present

## 2022-05-16 DIAGNOSIS — Z510 Encounter for antineoplastic radiation therapy: Secondary | ICD-10-CM | POA: Diagnosis not present

## 2022-05-16 LAB — RAD ONC ARIA SESSION SUMMARY
Course Elapsed Days: 1
Plan Fractions Treated to Date: 2
Plan Prescribed Dose Per Fraction: 2 Gy
Plan Total Fractions Prescribed: 30
Plan Total Prescribed Dose: 60 Gy
Reference Point Dosage Given to Date: 4 Gy
Reference Point Session Dosage Given: 2 Gy
Session Number: 2

## 2022-05-17 ENCOUNTER — Other Ambulatory Visit: Payer: Self-pay

## 2022-05-17 ENCOUNTER — Ambulatory Visit
Admission: RE | Admit: 2022-05-17 | Discharge: 2022-05-17 | Disposition: A | Discharge status: Home / Self Care | Payer: Medicare Other | Source: Ambulatory Visit | Attending: Radiation Oncology | Admitting: Radiation Oncology

## 2022-05-17 DIAGNOSIS — E876 Hypokalemia: Secondary | ICD-10-CM | POA: Diagnosis not present

## 2022-05-17 DIAGNOSIS — R112 Nausea with vomiting, unspecified: Secondary | ICD-10-CM | POA: Insufficient documentation

## 2022-05-17 DIAGNOSIS — C3412 Malignant neoplasm of upper lobe, left bronchus or lung: Secondary | ICD-10-CM | POA: Insufficient documentation

## 2022-05-17 DIAGNOSIS — Z510 Encounter for antineoplastic radiation therapy: Secondary | ICD-10-CM | POA: Insufficient documentation

## 2022-05-17 DIAGNOSIS — R197 Diarrhea, unspecified: Secondary | ICD-10-CM | POA: Diagnosis not present

## 2022-05-17 DIAGNOSIS — Z79899 Other long term (current) drug therapy: Secondary | ICD-10-CM | POA: Diagnosis not present

## 2022-05-17 DIAGNOSIS — C3490 Malignant neoplasm of unspecified part of unspecified bronchus or lung: Secondary | ICD-10-CM

## 2022-05-17 DIAGNOSIS — C771 Secondary and unspecified malignant neoplasm of intrathoracic lymph nodes: Secondary | ICD-10-CM | POA: Insufficient documentation

## 2022-05-17 DIAGNOSIS — F17210 Nicotine dependence, cigarettes, uncomplicated: Secondary | ICD-10-CM | POA: Diagnosis not present

## 2022-05-17 DIAGNOSIS — I10 Essential (primary) hypertension: Secondary | ICD-10-CM | POA: Diagnosis not present

## 2022-05-17 DIAGNOSIS — R1310 Dysphagia, unspecified: Secondary | ICD-10-CM | POA: Diagnosis not present

## 2022-05-17 DIAGNOSIS — Z5111 Encounter for antineoplastic chemotherapy: Secondary | ICD-10-CM | POA: Diagnosis not present

## 2022-05-17 LAB — RAD ONC ARIA SESSION SUMMARY
Course Elapsed Days: 2
Plan Fractions Treated to Date: 3
Plan Prescribed Dose Per Fraction: 2 Gy
Plan Total Fractions Prescribed: 30
Plan Total Prescribed Dose: 60 Gy
Reference Point Dosage Given to Date: 6 Gy
Reference Point Session Dosage Given: 2 Gy
Session Number: 3

## 2022-05-17 NOTE — Telephone Encounter (Signed)
This nurse returned a call to this patient related to a message stating he needed to know what Prochlorperazine is.  This nurse informed patient that this medication is used for nausea and vomiting.  He stated ok and that what he needs.  This nurse asked patient if he needs a refill sent and he states no he has some at the moment.  No further questions or concerns noted at this time.

## 2022-05-18 ENCOUNTER — Ambulatory Visit
Admission: RE | Admit: 2022-05-18 | Discharge: 2022-05-18 | Disposition: A | Discharge status: Home / Self Care | Payer: Medicare Other | Source: Ambulatory Visit | Attending: Radiation Oncology | Admitting: Radiation Oncology

## 2022-05-18 ENCOUNTER — Other Ambulatory Visit: Payer: Self-pay

## 2022-05-18 DIAGNOSIS — I10 Essential (primary) hypertension: Secondary | ICD-10-CM | POA: Diagnosis not present

## 2022-05-18 DIAGNOSIS — F17210 Nicotine dependence, cigarettes, uncomplicated: Secondary | ICD-10-CM | POA: Diagnosis not present

## 2022-05-18 DIAGNOSIS — C3412 Malignant neoplasm of upper lobe, left bronchus or lung: Secondary | ICD-10-CM | POA: Diagnosis not present

## 2022-05-18 DIAGNOSIS — R112 Nausea with vomiting, unspecified: Secondary | ICD-10-CM | POA: Diagnosis not present

## 2022-05-18 DIAGNOSIS — C771 Secondary and unspecified malignant neoplasm of intrathoracic lymph nodes: Secondary | ICD-10-CM | POA: Diagnosis not present

## 2022-05-18 DIAGNOSIS — R1310 Dysphagia, unspecified: Secondary | ICD-10-CM | POA: Diagnosis not present

## 2022-05-18 DIAGNOSIS — R197 Diarrhea, unspecified: Secondary | ICD-10-CM | POA: Diagnosis not present

## 2022-05-18 DIAGNOSIS — Z5111 Encounter for antineoplastic chemotherapy: Secondary | ICD-10-CM | POA: Diagnosis not present

## 2022-05-18 DIAGNOSIS — Z510 Encounter for antineoplastic radiation therapy: Secondary | ICD-10-CM | POA: Diagnosis not present

## 2022-05-18 DIAGNOSIS — E876 Hypokalemia: Secondary | ICD-10-CM | POA: Diagnosis not present

## 2022-05-18 DIAGNOSIS — Z79899 Other long term (current) drug therapy: Secondary | ICD-10-CM | POA: Diagnosis not present

## 2022-05-18 LAB — RAD ONC ARIA SESSION SUMMARY
Course Elapsed Days: 3
Plan Fractions Treated to Date: 4
Plan Prescribed Dose Per Fraction: 2 Gy
Plan Total Fractions Prescribed: 30
Plan Total Prescribed Dose: 60 Gy
Reference Point Dosage Given to Date: 8 Gy
Reference Point Session Dosage Given: 2 Gy
Session Number: 4

## 2022-05-18 MED FILL — Dexamethasone Sodium Phosphate Inj 100 MG/10ML: INTRAMUSCULAR | Qty: 1 | Status: AC

## 2022-05-21 ENCOUNTER — Inpatient Hospital Stay: Payer: Medicare Other | Attending: Internal Medicine

## 2022-05-21 ENCOUNTER — Ambulatory Visit
Admission: RE | Admit: 2022-05-21 | Discharge: 2022-05-21 | Disposition: A | Discharge status: Home / Self Care | Payer: Medicare Other | Source: Ambulatory Visit | Attending: Radiation Oncology | Admitting: Radiation Oncology

## 2022-05-21 ENCOUNTER — Other Ambulatory Visit: Payer: Self-pay

## 2022-05-21 ENCOUNTER — Inpatient Hospital Stay: Payer: Medicare Other

## 2022-05-21 VITALS — BP 144/82 | HR 86 | Temp 98.6°F | Resp 16 | Wt 142.5 lb

## 2022-05-21 DIAGNOSIS — C3412 Malignant neoplasm of upper lobe, left bronchus or lung: Secondary | ICD-10-CM | POA: Insufficient documentation

## 2022-05-21 DIAGNOSIS — R197 Diarrhea, unspecified: Secondary | ICD-10-CM | POA: Insufficient documentation

## 2022-05-21 DIAGNOSIS — I10 Essential (primary) hypertension: Secondary | ICD-10-CM | POA: Insufficient documentation

## 2022-05-21 DIAGNOSIS — R112 Nausea with vomiting, unspecified: Secondary | ICD-10-CM | POA: Insufficient documentation

## 2022-05-21 DIAGNOSIS — C771 Secondary and unspecified malignant neoplasm of intrathoracic lymph nodes: Secondary | ICD-10-CM | POA: Insufficient documentation

## 2022-05-21 DIAGNOSIS — R1310 Dysphagia, unspecified: Secondary | ICD-10-CM | POA: Insufficient documentation

## 2022-05-21 DIAGNOSIS — C3490 Malignant neoplasm of unspecified part of unspecified bronchus or lung: Secondary | ICD-10-CM

## 2022-05-21 DIAGNOSIS — Z5111 Encounter for antineoplastic chemotherapy: Secondary | ICD-10-CM | POA: Diagnosis not present

## 2022-05-21 DIAGNOSIS — Z79899 Other long term (current) drug therapy: Secondary | ICD-10-CM | POA: Insufficient documentation

## 2022-05-21 DIAGNOSIS — E876 Hypokalemia: Secondary | ICD-10-CM | POA: Diagnosis not present

## 2022-05-21 DIAGNOSIS — F17210 Nicotine dependence, cigarettes, uncomplicated: Secondary | ICD-10-CM | POA: Diagnosis not present

## 2022-05-21 DIAGNOSIS — Z510 Encounter for antineoplastic radiation therapy: Secondary | ICD-10-CM | POA: Insufficient documentation

## 2022-05-21 LAB — RAD ONC ARIA SESSION SUMMARY
Course Elapsed Days: 6
Plan Fractions Treated to Date: 5
Plan Prescribed Dose Per Fraction: 2 Gy
Plan Total Fractions Prescribed: 30
Plan Total Prescribed Dose: 60 Gy
Reference Point Dosage Given to Date: 10 Gy
Reference Point Session Dosage Given: 2 Gy
Session Number: 5

## 2022-05-21 LAB — CMP (CANCER CENTER ONLY)
ALT: 33 U/L (ref 0–44)
AST: 25 U/L (ref 15–41)
Albumin: 3.8 g/dL (ref 3.5–5.0)
Alkaline Phosphatase: 103 U/L (ref 38–126)
Anion gap: 9 (ref 5–15)
BUN: 27 mg/dL — ABNORMAL HIGH (ref 8–23)
CO2: 23 mmol/L (ref 22–32)
Calcium: 9.4 mg/dL (ref 8.9–10.3)
Chloride: 107 mmol/L (ref 98–111)
Creatinine: 0.75 mg/dL (ref 0.61–1.24)
GFR, Estimated: >60 mL/min (ref >60)
Glucose, Bld: 92 mg/dL (ref 70–99)
Potassium: 3.5 mmol/L (ref 3.5–5.1)
Sodium: 139 mmol/L (ref 135–145)
Total Bilirubin: 0.4 mg/dL (ref 0.3–1.2)
Total Protein: 6.4 g/dL — ABNORMAL LOW (ref 6.5–8.1)

## 2022-05-21 LAB — CBC WITH DIFFERENTIAL (CANCER CENTER ONLY)
Abs Immature Granulocytes: 0.03 10*3/uL (ref 0.00–0.07)
Basophils Absolute: 0 10*3/uL (ref 0.0–0.1)
Basophils Relative: 0 %
Eosinophils Absolute: 0.1 10*3/uL (ref 0.0–0.5)
Eosinophils Relative: 1 %
HCT: 30.8 % — ABNORMAL LOW (ref 39.0–52.0)
Hemoglobin: 10.7 g/dL — ABNORMAL LOW (ref 13.0–17.0)
Immature Granulocytes: 1 %
Lymphocytes Relative: 22 %
Lymphs Abs: 1.1 10*3/uL (ref 0.7–4.0)
MCH: 32.7 pg (ref 26.0–34.0)
MCHC: 34.7 g/dL (ref 30.0–36.0)
MCV: 94.2 fL (ref 80.0–100.0)
Monocytes Absolute: 0.3 10*3/uL (ref 0.1–1.0)
Monocytes Relative: 6 %
Neutro Abs: 3.4 10*3/uL (ref 1.7–7.7)
Neutrophils Relative %: 70 %
Platelet Count: 187 10*3/uL (ref 150–400)
RBC: 3.27 MIL/uL — ABNORMAL LOW (ref 4.22–5.81)
RDW: 14.2 % (ref 11.5–15.5)
WBC Count: 4.8 10*3/uL (ref 4.0–10.5)
nRBC: 0 % (ref 0.0–0.2)

## 2022-05-21 MED ORDER — DIPHENHYDRAMINE HCL 50 MG/ML IJ SOLN
50.0000 mg | Freq: Once | INTRAMUSCULAR | Status: AC
  Administered 2022-05-21: 50 mg via INTRAVENOUS
  Filled 2022-05-21: qty 1

## 2022-05-21 MED ORDER — SODIUM CHLORIDE 0.9 % IV SOLN
177.6000 mg | Freq: Once | INTRAVENOUS | Status: AC
  Administered 2022-05-21: 180 mg via INTRAVENOUS
  Filled 2022-05-21: qty 18

## 2022-05-21 MED ORDER — SODIUM CHLORIDE 0.9 % IV SOLN
45.0000 mg/m2 | Freq: Once | INTRAVENOUS | Status: AC
  Administered 2022-05-21: 84 mg via INTRAVENOUS
  Filled 2022-05-21: qty 14

## 2022-05-21 MED ORDER — SODIUM CHLORIDE 0.9 % IV SOLN: Freq: Once | INTRAVENOUS | Status: AC

## 2022-05-21 MED ORDER — PALONOSETRON HCL INJECTION 0.25 MG/5ML
0.2500 mg | Freq: Once | INTRAVENOUS | Status: AC
  Administered 2022-05-21: 0.25 mg via INTRAVENOUS
  Filled 2022-05-21: qty 5

## 2022-05-21 MED ORDER — SODIUM CHLORIDE 0.9 % IV SOLN
10.0000 mg | Freq: Once | INTRAVENOUS | Status: AC
  Administered 2022-05-21: 10 mg via INTRAVENOUS
  Filled 2022-05-21: qty 10

## 2022-05-21 MED ORDER — FAMOTIDINE IN NACL 20-0.9 MG/50ML-% IV SOLN
20.0000 mg | Freq: Once | INTRAVENOUS | Status: AC
  Administered 2022-05-21: 20 mg via INTRAVENOUS
  Filled 2022-05-21: qty 50

## 2022-05-21 NOTE — Patient Instructions (Signed)
[facility]  Discharge Instructions: Thank you for choosing [facility] to provide your oncology and hematology care.   If you have a lab appointment with the [facility], please go directly to the [facility] and check in at the registration area.   Wear comfortable clothing and clothing appropriate for easy access to any Portacath or PICC line.   We strive to give you quality time with your provider. You may need to reschedule your appointment if you arrive late (15 or more minutes).  Arriving late affects you and other patients whose appointments are after yours.  Also, if you miss three or more appointments without notifying the office, you may be dismissed from the clinic at the provider's discretion.      For prescription refill requests, have your pharmacy contact our office and allow 72 hours for refills to be completed.    Today you received the following chemotherapy and/or immunotherapy agents: paclitaxel and carboplatin      To help prevent nausea and vomiting after your treatment, we encourage you to take your nausea medication as directed.  BELOW ARE SYMPTOMS THAT SHOULD BE REPORTED IMMEDIATELY: *FEVER GREATER THAN 100.4 F (38 C) OR HIGHER *CHILLS OR SWEATING *NAUSEA AND VOMITING THAT IS NOT CONTROLLED WITH YOUR NAUSEA MEDICATION *UNUSUAL SHORTNESS OF BREATH *UNUSUAL BRUISING OR BLEEDING *URINARY PROBLEMS (pain or burning when urinating, or frequent urination) *BOWEL PROBLEMS (unusual diarrhea, constipation, pain near the anus) TENDERNESS IN MOUTH AND THROAT WITH OR WITHOUT PRESENCE OF ULCERS (sore throat, sores in mouth, or a toothache) UNUSUAL RASH, SWELLING OR PAIN  UNUSUAL VAGINAL DISCHARGE OR ITCHING   Items with * indicate a potential emergency and should be followed up as soon as possible or go to the Emergency Department if any problems should occur.  Please show the CHEMOTHERAPY ALERT CARD or IMMUNOTHERAPY  ALERT CARD at check-in to the Emergency Department and triage nurse.  Should you have questions after your visit or need to cancel or reschedule your appointment, please contact [facility]  Dept: [phone]  and follow the prompts.  Office hours are 8:00 a.m. to 4:30 p.m. Monday - Friday. Please note that voicemails left after 4:00 p.m. may not be returned until the following business day.  We are closed weekends and major holidays. You have access to a nurse at all times for urgent questions. Please call the main number to the clinic Dept: [phone] and follow the prompts.   For any non-urgent questions, you may also contact your provider using MyChart. We now offer e-Visits for anyone 4 and older to request care online for non-urgent symptoms. For details visit [email or web address].   Also download the MyChart app! Go to the app store, search "MyChart", open the app, select [facility], and log in with your MyChart username and password.

## 2022-05-22 ENCOUNTER — Ambulatory Visit
Admission: RE | Admit: 2022-05-22 | Discharge: 2022-05-22 | Disposition: A | Discharge status: Home / Self Care | Payer: Medicare Other | Source: Ambulatory Visit | Attending: Radiation Oncology | Admitting: Radiation Oncology

## 2022-05-22 ENCOUNTER — Other Ambulatory Visit: Payer: Self-pay

## 2022-05-22 DIAGNOSIS — Z79899 Other long term (current) drug therapy: Secondary | ICD-10-CM | POA: Diagnosis not present

## 2022-05-22 DIAGNOSIS — R112 Nausea with vomiting, unspecified: Secondary | ICD-10-CM | POA: Diagnosis not present

## 2022-05-22 DIAGNOSIS — Z5111 Encounter for antineoplastic chemotherapy: Secondary | ICD-10-CM | POA: Diagnosis not present

## 2022-05-22 DIAGNOSIS — I10 Essential (primary) hypertension: Secondary | ICD-10-CM | POA: Diagnosis not present

## 2022-05-22 DIAGNOSIS — F17210 Nicotine dependence, cigarettes, uncomplicated: Secondary | ICD-10-CM | POA: Diagnosis not present

## 2022-05-22 DIAGNOSIS — R197 Diarrhea, unspecified: Secondary | ICD-10-CM | POA: Diagnosis not present

## 2022-05-22 DIAGNOSIS — C771 Secondary and unspecified malignant neoplasm of intrathoracic lymph nodes: Secondary | ICD-10-CM | POA: Diagnosis not present

## 2022-05-22 DIAGNOSIS — E876 Hypokalemia: Secondary | ICD-10-CM | POA: Diagnosis not present

## 2022-05-22 DIAGNOSIS — C3412 Malignant neoplasm of upper lobe, left bronchus or lung: Secondary | ICD-10-CM | POA: Diagnosis not present

## 2022-05-22 DIAGNOSIS — R1310 Dysphagia, unspecified: Secondary | ICD-10-CM | POA: Diagnosis not present

## 2022-05-22 DIAGNOSIS — Z510 Encounter for antineoplastic radiation therapy: Secondary | ICD-10-CM | POA: Diagnosis not present

## 2022-05-22 LAB — RAD ONC ARIA SESSION SUMMARY
Course Elapsed Days: 7
Plan Fractions Treated to Date: 6
Plan Prescribed Dose Per Fraction: 2 Gy
Plan Total Fractions Prescribed: 30
Plan Total Prescribed Dose: 60 Gy
Reference Point Dosage Given to Date: 12 Gy
Reference Point Session Dosage Given: 2 Gy
Session Number: 6

## 2022-05-23 ENCOUNTER — Other Ambulatory Visit: Payer: Self-pay

## 2022-05-23 ENCOUNTER — Other Ambulatory Visit: Payer: Self-pay | Admitting: Internal Medicine

## 2022-05-23 ENCOUNTER — Ambulatory Visit
Admission: RE | Admit: 2022-05-23 | Discharge: 2022-05-23 | Disposition: A | Discharge status: Home / Self Care | Payer: Medicare Other | Source: Ambulatory Visit | Attending: Radiation Oncology | Admitting: Radiation Oncology

## 2022-05-23 DIAGNOSIS — R197 Diarrhea, unspecified: Secondary | ICD-10-CM | POA: Diagnosis not present

## 2022-05-23 DIAGNOSIS — R1310 Dysphagia, unspecified: Secondary | ICD-10-CM | POA: Diagnosis not present

## 2022-05-23 DIAGNOSIS — Z79899 Other long term (current) drug therapy: Secondary | ICD-10-CM | POA: Diagnosis not present

## 2022-05-23 DIAGNOSIS — C3412 Malignant neoplasm of upper lobe, left bronchus or lung: Secondary | ICD-10-CM | POA: Diagnosis not present

## 2022-05-23 DIAGNOSIS — Z510 Encounter for antineoplastic radiation therapy: Secondary | ICD-10-CM | POA: Diagnosis not present

## 2022-05-23 DIAGNOSIS — C771 Secondary and unspecified malignant neoplasm of intrathoracic lymph nodes: Secondary | ICD-10-CM | POA: Diagnosis not present

## 2022-05-23 DIAGNOSIS — Z5111 Encounter for antineoplastic chemotherapy: Secondary | ICD-10-CM | POA: Diagnosis not present

## 2022-05-23 DIAGNOSIS — E876 Hypokalemia: Secondary | ICD-10-CM | POA: Diagnosis not present

## 2022-05-23 DIAGNOSIS — F17210 Nicotine dependence, cigarettes, uncomplicated: Secondary | ICD-10-CM | POA: Diagnosis not present

## 2022-05-23 DIAGNOSIS — R112 Nausea with vomiting, unspecified: Secondary | ICD-10-CM | POA: Diagnosis not present

## 2022-05-23 DIAGNOSIS — I10 Essential (primary) hypertension: Secondary | ICD-10-CM | POA: Diagnosis not present

## 2022-05-23 LAB — RAD ONC ARIA SESSION SUMMARY
Course Elapsed Days: 8
Plan Fractions Treated to Date: 7
Plan Prescribed Dose Per Fraction: 2 Gy
Plan Total Fractions Prescribed: 30
Plan Total Prescribed Dose: 60 Gy
Reference Point Dosage Given to Date: 14 Gy
Reference Point Session Dosage Given: 2 Gy
Session Number: 7

## 2022-05-24 ENCOUNTER — Ambulatory Visit
Admission: RE | Admit: 2022-05-24 | Discharge: 2022-05-24 | Disposition: A | Discharge status: Home / Self Care | Payer: Medicare Other | Source: Ambulatory Visit | Attending: Radiation Oncology | Admitting: Radiation Oncology

## 2022-05-24 ENCOUNTER — Other Ambulatory Visit: Payer: Self-pay

## 2022-05-24 DIAGNOSIS — F17210 Nicotine dependence, cigarettes, uncomplicated: Secondary | ICD-10-CM | POA: Diagnosis not present

## 2022-05-24 DIAGNOSIS — Z5111 Encounter for antineoplastic chemotherapy: Secondary | ICD-10-CM | POA: Diagnosis not present

## 2022-05-24 DIAGNOSIS — Z510 Encounter for antineoplastic radiation therapy: Secondary | ICD-10-CM | POA: Diagnosis not present

## 2022-05-24 DIAGNOSIS — E876 Hypokalemia: Secondary | ICD-10-CM | POA: Diagnosis not present

## 2022-05-24 DIAGNOSIS — I10 Essential (primary) hypertension: Secondary | ICD-10-CM | POA: Diagnosis not present

## 2022-05-24 DIAGNOSIS — R1310 Dysphagia, unspecified: Secondary | ICD-10-CM | POA: Diagnosis not present

## 2022-05-24 DIAGNOSIS — R112 Nausea with vomiting, unspecified: Secondary | ICD-10-CM | POA: Diagnosis not present

## 2022-05-24 DIAGNOSIS — C3412 Malignant neoplasm of upper lobe, left bronchus or lung: Secondary | ICD-10-CM | POA: Diagnosis not present

## 2022-05-24 DIAGNOSIS — Z79899 Other long term (current) drug therapy: Secondary | ICD-10-CM | POA: Diagnosis not present

## 2022-05-24 DIAGNOSIS — C771 Secondary and unspecified malignant neoplasm of intrathoracic lymph nodes: Secondary | ICD-10-CM | POA: Diagnosis not present

## 2022-05-24 DIAGNOSIS — R197 Diarrhea, unspecified: Secondary | ICD-10-CM | POA: Diagnosis not present

## 2022-05-24 LAB — RAD ONC ARIA SESSION SUMMARY
Course Elapsed Days: 9
Plan Fractions Treated to Date: 8
Plan Prescribed Dose Per Fraction: 2 Gy
Plan Total Fractions Prescribed: 30
Plan Total Prescribed Dose: 60 Gy
Reference Point Dosage Given to Date: 16 Gy
Reference Point Session Dosage Given: 2 Gy
Session Number: 8

## 2022-05-25 ENCOUNTER — Ambulatory Visit
Admission: RE | Admit: 2022-05-25 | Discharge: 2022-05-25 | Disposition: A | Discharge status: Home / Self Care | Payer: Medicare Other | Source: Ambulatory Visit | Attending: Radiation Oncology | Admitting: Radiation Oncology

## 2022-05-25 ENCOUNTER — Other Ambulatory Visit: Payer: Self-pay

## 2022-05-25 DIAGNOSIS — R197 Diarrhea, unspecified: Secondary | ICD-10-CM | POA: Diagnosis not present

## 2022-05-25 DIAGNOSIS — Z5111 Encounter for antineoplastic chemotherapy: Secondary | ICD-10-CM | POA: Diagnosis not present

## 2022-05-25 DIAGNOSIS — R1310 Dysphagia, unspecified: Secondary | ICD-10-CM | POA: Diagnosis not present

## 2022-05-25 DIAGNOSIS — C771 Secondary and unspecified malignant neoplasm of intrathoracic lymph nodes: Secondary | ICD-10-CM | POA: Diagnosis not present

## 2022-05-25 DIAGNOSIS — R112 Nausea with vomiting, unspecified: Secondary | ICD-10-CM | POA: Diagnosis not present

## 2022-05-25 DIAGNOSIS — E876 Hypokalemia: Secondary | ICD-10-CM | POA: Diagnosis not present

## 2022-05-25 DIAGNOSIS — I10 Essential (primary) hypertension: Secondary | ICD-10-CM | POA: Diagnosis not present

## 2022-05-25 DIAGNOSIS — F17210 Nicotine dependence, cigarettes, uncomplicated: Secondary | ICD-10-CM | POA: Diagnosis not present

## 2022-05-25 DIAGNOSIS — Z510 Encounter for antineoplastic radiation therapy: Secondary | ICD-10-CM | POA: Diagnosis not present

## 2022-05-25 DIAGNOSIS — C3412 Malignant neoplasm of upper lobe, left bronchus or lung: Secondary | ICD-10-CM | POA: Diagnosis not present

## 2022-05-25 DIAGNOSIS — Z79899 Other long term (current) drug therapy: Secondary | ICD-10-CM | POA: Diagnosis not present

## 2022-05-25 LAB — RAD ONC ARIA SESSION SUMMARY
Course Elapsed Days: 10
Plan Fractions Treated to Date: 9
Plan Prescribed Dose Per Fraction: 2 Gy
Plan Total Fractions Prescribed: 30
Plan Total Prescribed Dose: 60 Gy
Reference Point Dosage Given to Date: 18 Gy
Reference Point Session Dosage Given: 2 Gy
Session Number: 9

## 2022-05-25 MED FILL — Dexamethasone Sodium Phosphate Inj 100 MG/10ML: INTRAMUSCULAR | Qty: 1 | Status: AC

## 2022-05-25 NOTE — Progress Notes (Unsigned)
[facility] OFFICE PROGRESS NOTE  Jesse Ruddy, MD [place] [address]  DIAGNOSIS: 1 ) recurrent non-small cell lung cancer, adenocarcinoma with positive mediastinal lymph node in December 2023 that was initially diagnosed as stage IIB (T1 a, N1, M0) non-small cell lung cancer, adenocarcinoma diagnosed in June 2021. 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  Biomarker Findings Tumor Mutational Burden - 20 Muts/Mb Microsatellite status - MS-Stable Genomic Findings For a complete list of the genes assayed, please refer to the Appendix. KEAP1 I442fs*17 ERDE0C X448* RB1 splice site 1856+3J>S HF02 R209* 8 Disease relevant genes with no reportable alterations: ALK, BRAF, EGFR, ERBB2, KRAS, MET, RET, ROS1   PDL1 Expression: 1 %  PRIOR THERAPY: 1) status post left upper lobectomy with lymph node dissection on December 16, 2019 under the care of Dr. Kipp Brood.  The tumor size measured 0.9 cm but there was involvement of the level 10 L and 11 L.  2) Adjuvant systemic chemotherapy with cisplatin 75 mg/M2 and Alimta 500 mg/M2 every 3 weeks.  First dose February 04, 2020.  Status post 4 cycles. 3) Neoadjuvant treatment with imatinib 400 mg p.o. daily for the GIST tumor of the pelvic area.  First dose started July 18, 2020.  Status post 12 months of treatment. 4) status post robotic assisted very low anterior rectosigmoid resection with coloanal anastomosis, diverting loop ileostomy and wedge liver biopsy under the care of Dr. Johney Maine on December 14, 2021 and it showed minimal residual gastrointestinal stromal tumor.  CURRENT THERAPY:  1) concurrent chemoradiation with weekly carboplatin for AUC of 2 and paclitaxel 45 Mg/M2.  First dose May 14, 2022.  That is post 2 cycles 2) Imatinib 400 mg p.o. daily    INTERVAL HISTORY: Jesse [name] 72 y.o. male returns to the clinic today for a follow-up visit.  The patient recently was found to have evidence  of disease progression and he is currently undergoing treatment concurrent chemoradiation.  He is status post his first 2 cycles of treatment and he tolerated well without any adverse side effects.  His last day radiation is tentatively scheduled for 06/25/22.  The patient also continues to take imatinib for his history of GIST tumor.   Overall, the patient has had significant diarrhea since undergoing his ileostomy takedown in early December 2023.  He is taking Imodium and lomotil which does slow it down somewhat but still continues to have significant diarrhea.  He was recently started on cholestyramine and he is supposed to call Dr. Clyda Greener office to follow-up to see if this is helping after 2 weeks of taking this.  The patient has been on [place] for his history of GIST tumor and tolerated this fine for the 2 years he was taking it prior to his ileostomy takedown.  He states that he was checked for GI pathogen panel and C. difficile which was negative at Dr. Clyda Greener office.  Because of the frequent diarrhea, the patient has poor appetite.  He has intermittent nausea for which she is prescribed Compazine and Zofran.  He is only have vomiting approximately 2 times.  He is also starting to develop some odynophagia and dysphagia from the radiation.  He is scheduled to see Dr. Sondra Come tomorrow.  He is taking Megace for his decreased appetite and they are wondering what additional options there are.  He also drinks supplemental protein drinks once to 2 times per day.  Lost approximately 5 pounds since last being seen.  Today the patient denies any fever or night sweats.  He has cold intolerance secondary to the weight loss and being thin.  He denies any chest pain, shortness of breath, cough, or hemoptysis.  Denies any headache or visual changes. He is here today for evaluation repeat blood work before undergoing cycle #3.     MEDICAL HISTORY: Past Medical History:  Diagnosis Date   Anemia    Cancer  ([place])    bladder cancer   COPD (chronic obstructive pulmonary disease) ([place])    mild    DIVERTICULOSIS, COLON 01/14/2007   Qualifier: Diagnosis of  By: Paulina Fusi RN, [name]    GASTRITIS, CHRONIC 06/01/2004   Qualifier: Diagnosis of  By: Nolon Rod CMA ([place]), Robin     GERD 01/09/2007   Qualifier: Diagnosis of  By: Sherren Mocha, RN, Ellen     Heart murmur    as a child; no murmur heard 12/14/19   HYPERLIPIDEMIA 01/09/2007   Qualifier: Diagnosis of  By: Sherren Mocha RN, Dorian Pod     Hypertension    Lung mass    left upper nodule   Macular degeneration    right eye   NEOPLASM, MALIGNANT, BLADDER, HX OF 2002   Qualifier: Diagnosis of  By: Nolon Rod CMA ([facility]), Robin  / no chemo or radiation   OSTEOARTHRITIS 01/14/2007   Qualifier: Diagnosis of  By: Paulina Fusi, RN, [name] - knees   Rectal mass    Reflux esophagitis 06/01/2004   Qualifier: Diagnosis of  By: Nolon Rod CMA (AAMA), Robin     TOBACCO USER 02/16/2009   Qualifier: Diagnosis of  By: Burnice Logan  MD, Doretha Sou    VITAMIN D DEFICIENCY 06/03/2007   Qualifier: Diagnosis of  By: Burnice Logan  MD, Doretha Sou     ALLERGIES:  has No Known Allergies.  MEDICATIONS:  Current Outpatient Medications  Medication Sig Dispense Refill   acetaminophen (TYLENOL) 500 MG tablet Take 1,000 mg by mouth every 8 (eight) hours as needed for moderate pain.     amLODipine (NORVASC) 5 MG tablet Take 1 tablet (5 mg total) by mouth daily. 90 tablet 3   atorvastatin (LIPITOR) 40 MG tablet Take 1/2 tablet (20 mg total) by mouth daily. 90 tablet 3   Cholecalciferol (VITAMIN D3 PO) Take 1 capsule by mouth daily.     cholestyramine (QUESTRAN) 4 GM/DOSE powder Take 4 g by mouth 2 (two) times daily.     diphenoxylate-atropine (LOMOTIL) 2.5-0.025 MG tablet Take 2 tablets by mouth 4 (four) times daily as needed for diarrhea or loose stools.     ferrous sulfate 325 (65 FE) MG EC tablet Take 1 tablet (325 mg total) by mouth 2 (two) times daily. 60 tablet 1   imatinib (GLEEVEC) 400 MG  tablet Take 1 tablet (400 mg total) by mouth daily. Take with meals and large glass of water.Caution:Chemotherapy. (Patient taking differently: Take 400 mg by mouth at bedtime. Take with meals and large glass of water.Caution:Chemotherapy.) 30 tablet 3   loperamide (IMODIUM A-D) 2 MG tablet Take 4 mg by mouth 4 (four) times daily as needed for diarrhea or loose stools.     megestrol (MEGACE ES) 625 MG/5ML suspension TAKE 5 MLS (625 MG TOTAL) BY MOUTH DAILY. 450 mL 1   mirtazapine (REMERON) 15 MG tablet Take 1 tablet (15 mg total) by mouth at bedtime. 30 tablet 2   Multiple Vitamins-Minerals (PRESERVISION AREDS 2) CAPS Take 1 capsule by mouth 2 (two) times daily.     omeprazole (PRILOSEC) 40 MG capsule  Take 1 capsule (40 mg total) by mouth daily. 90 capsule 3   ondansetron (ZOFRAN-ODT) 4 MG disintegrating tablet Take by mouth.     OVER THE COUNTER MEDICATION Take 3 tablets by mouth daily.  Balance of Nature Fruits     OVER THE COUNTER MEDICATION Take 3 tablets by mouth daily. Balance of Nature Vegetable     prochlorperazine (COMPAZINE) 10 MG tablet Take 1 tablet (10 mg total) by mouth every 6 (six) hours as needed for nausea or vomiting. 30 tablet 0   sucralfate (CARAFATE) 1 g tablet Take 1 tablet by mouth 4 times daily -  with meals and at bedtime. 60 tablet 1   No current facility-administered medications for this visit.    SURGICAL HISTORY:  Past Surgical History:  Procedure Laterality Date   BREAST BIOPSY Left 03/16/2022   [place] LT RADIOACTIVE SEED LOC 03/16/2022 [facility]   BRONCHIAL BIOPSY  10/13/2019   Procedure: BRONCHIAL BIOPSIES;  Surgeon: Garner Nash, DO;  Location: [facility];  Service: Pulmonary;;   BRONCHIAL BRUSHINGS  10/13/2019   Procedure: BRONCHIAL BRUSHINGS;  Surgeon: Garner Nash, DO;  Location: [facility];  Service: Pulmonary;;   BRONCHIAL NEEDLE ASPIRATION BIOPSY  10/13/2019   Procedure: BRONCHIAL NEEDLE ASPIRATION BIOPSIES;  Surgeon: Garner Nash, DO;   Location: [facility];  Service: Pulmonary;;   BRONCHIAL NEEDLE ASPIRATION BIOPSY  04/11/2022   Procedure: BRONCHIAL NEEDLE ASPIRATION BIOPSIES;  Surgeon: Garner Nash, DO;  Location: [facility];  Service: Cardiopulmonary;;   BRONCHIAL WASHINGS  10/13/2019   Procedure: BRONCHIAL WASHINGS;  Surgeon: Garner Nash, DO;  Location: [facility];  Service: Pulmonary;;   COLONOSCOPY     multiple   DIVERTING ILEOSTOMY N/A 12/14/2021   Procedure: DIVERTING ILEOSTOMY;  Surgeon: Michael Boston, MD;  Location: [facility];  Service: General;  Laterality: N/A;   ENDOBRONCHIAL ULTRASOUND Bilateral 04/11/2022   Procedure: ENDOBRONCHIAL ULTRASOUND;  Surgeon: Garner Nash, DO;  Location: [facility];  Service: Cardiopulmonary;  Laterality: Bilateral;   ILEOSTOMY CLOSURE N/A 03/22/2022   Procedure: OPEN TAKEDOWN OF LOOP ILEOSTOMY;  Surgeon: Michael Boston, MD;  Location: [facility];  Service: General;  Laterality: N/A;  GEN w/[facility]AS PATHWAY LOCAL   INTERCOSTAL NERVE BLOCK Left 12/16/2019   Procedure: INTERCOSTAL NERVE BLOCK;  Surgeon: Lajuana Matte, MD;  Location: [facility];  Service: Thoracic;  Laterality: Left;   KNEE SURGERY  07/20/2008   bilat.   NASAL SEPTUM SURGERY  1995   NODE DISSECTION Left 12/16/2019   Procedure: NODE DISSECTION;  Surgeon: Lajuana Matte, MD;  Location: [facility];  Service: Thoracic;  Laterality: Left;   RADIOACTIVE SEED GUIDED AXILLARY SENTINEL LYMPH NODE Left 03/20/2022   Procedure: RADIOACTIVE SEED GUIDED LEFT AXILLARY SENTINEL LYMPH NODE BIOPSY;  Surgeon: Erroll Luna, MD;  Location: [facility];  Service: General;  Laterality: Left;   RECTAL EXAM UNDER ANESTHESIA N/A 03/22/2022   Procedure: ANORECTAL EXAMINATION UNDER ANESTHESIA;  Surgeon: Michael Boston, MD;  Location: [facility];  Service: General;  Laterality: N/A;   TONSILLECTOMY     TRANSURETHRAL RESECTION OF BLADDER TUMOR  2002   UPPER GASTROINTESTINAL ENDOSCOPY     VIDEO BRONCHOSCOPY  04/11/2022   Procedure: VIDEO  BRONCHOSCOPY;  Surgeon: Garner Nash, DO;  Location: [facility];  Service: Cardiopulmonary;;   VIDEO BRONCHOSCOPY WITH ENDOBRONCHIAL NAVIGATION N/A 10/13/2019   Procedure: VIDEO BRONCHOSCOPY WITH ENDOBRONCHIAL NAVIGATION;  Surgeon: Garner Nash, DO;  Location: [facility];  Service: Pulmonary;  Laterality: N/A;   WISDOM TOOTH  EXTRACTION     XI ROBOTIC ASSISTED LOWER ANTERIOR RESECTION N/A 12/14/2021   Procedure: XI ROBOTIC ASSISTED LOWER ANTERIOR ULTRA LOW RECTOSIGMOID RESECTION, COLOANAL HAND SEWN ANASTOMOSIS, AND BILATERAL TAP BLOCK;  Surgeon: Michael Boston, MD;  Location: [facility];  Service: General;  Laterality: N/A;    REVIEW OF SYSTEMS:   Review of Systems  Constitutional: Decreased appetite, fatigue, and weight loss.  Positive for cold intolerance.  Negative for chills and fever.  HENT: Positive for odynophagia and dysphagia.  Negative for mouth sores, nosebleeds, sore throat and trouble swallowing.   Eyes: Negative for eye problems and icterus.  Respiratory: Negative for cough, hemoptysis, shortness of breath and wheezing.   Cardiovascular: Negative for chest pain and leg swelling.  Gastrointestinal: Positive for frequent diarrhea.  Positive for nausea and vomiting x 2.  Negative for abdominal pain or constipation.  Genitourinary: Negative for bladder incontinence, difficulty urinating, dysuria, frequency and hematuria.   Musculoskeletal: Negative for back pain, gait problem, neck pain and neck stiffness.  Skin: Negative for itching and rash.  Neurological: Negative for dizziness, extremity weakness, gait problem, headaches, light-headedness and seizures.  Hematological: Negative for adenopathy. Does not bruise/bleed easily.  Psychiatric/Behavioral: Negative for confusion, depression and sleep disturbance. The patient is not nervous/anxious.     PHYSICAL EXAMINATION:  Blood pressure 131/71, pulse 96, temperature 97.7 F (36.5 C), temperature source Temporal, resp. rate 15,  height 5\' 9"  (1.753 m), weight 141 lb (64 kg), SpO2 100 %.  ECOG PERFORMANCE STATUS: 1  Physical Exam  Constitutional: Oriented to person, place, and time and hectic appearing male and in no acute distress.  HENT:  Head: Normocephalic and atraumatic.  Mouth/Throat: Oropharynx is clear and moist. No oropharyngeal exudate.  Eyes: Conjunctivae are normal. Right eye exhibits no discharge. Left eye exhibits no discharge. No scleral icterus.  Neck: Normal range of motion. Neck supple.  Cardiovascular: Normal rate, regular rhythm, normal heart sounds and intact distal pulses.   Pulmonary/Chest: Effort normal and breath sounds normal. No respiratory distress. No wheezes. No rales.  Abdominal: Soft. Bowel sounds are normal. Exhibits no distension and no mass. There is no tenderness.  Musculoskeletal: Normal range of motion. Exhibits no edema.  Lymphadenopathy:    No cervical adenopathy.  Neurological: Alert and oriented to person, place, and time. Exhibits normal muscle tone. Gait normal. Coordination normal.  Skin: Skin is warm and dry. No rash noted. Not diaphoretic. No erythema. No pallor.  Psychiatric: Mood, memory and judgment normal.  Vitals reviewed.  LABORATORY DATA: Lab Results  Component Value Date   WBC 3.7 (L) 05/28/2022   HGB 10.5 (L) 05/28/2022   HCT 29.9 (L) 05/28/2022   MCV 92.6 05/28/2022   PLT 227 05/28/2022      Chemistry      Component Value Date/Time   NA 137 05/28/2022 0951   K 3.4 (L) 05/28/2022 0951   CL 108 05/28/2022 0951   CO2 20 (L) 05/28/2022 0951   BUN 22 05/28/2022 0951   CREATININE 0.75 05/28/2022 0951      Component Value Date/Time   CALCIUM 9.1 05/28/2022 0951   ALKPHOS 83 05/28/2022 0951   AST 18 05/28/2022 0951   ALT 20 05/28/2022 0951   BILITOT 0.4 05/28/2022 0951       RADIOGRAPHIC STUDIES:  CT Chest W Contrast  Result Date: 05/03/2022 CLINICAL DATA:  Non-small-cell lung cancer.  * Tracking Code: BO * EXAM: CT CHEST WITH CONTRAST  TECHNIQUE: Multidetector CT imaging of the chest was performed during  intravenous contrast administration. RADIATION DOSE REDUCTION: This exam was performed according to the departmental dose-optimization program which includes automated exposure control, adjustment of the mA and/or kV according to patient size and/or use of iterative reconstruction technique. CONTRAST:  89mL OMNIPAQUE IOHEXOL 300 MG/ML  SOLN COMPARISON:  CT scan 01/19/2022 and older.  PET-CT scan 02/02/2022 FINDINGS: Cardiovascular: Heart is nonenlarged. No pericardial effusion. Coronary artery calcifications are seen. The thoracic aorta is slightly ectatic along the ascending aorta. Diameter at the level of the right pulmonary artery at 3.9 cm. Mediastinum/Nodes: Small cystic nodule along the left-sided normal caliber thoracic esophagus. No abnormal lymph nodes in the right axillary region. There is a complex lymph node in the left axillary region. This has some central low-density and on image 39 of series 2 measures 3.8 x 2.3 cm. Previously this measured 11 x 10 mm. Small right hilar node identified, also new from previous measuring 15 by 13 mm on image 69. Left hilar node is also enlarging today. On image 57 this measures 20 x 14 mm and previously would have measured 18 by 10 mm. Soft tissue thickening in the hilum on the left is increasing such as axial image 66. Adjacent surgical changes from lobectomy. Prominent other mediastinal nodes are also seen including subcarinal which are similar to slightly enlarged and high right paratracheal. Many of these were hypermetabolic on the prior PET-CT as well. Mild mesenteric stranding identified. Lungs/Pleura: Trace left-sided pleural fluid is new. No pneumothorax. No right-sided effusion. No consolidation. Several small lung nodules are identified including right apical nodule on series 7, image 20 measuring 6 mm. 6 mm subpleural right lower lobe nodule stable on image 121 of series 7 when measured in  same fashion as well. No new dominant nodules. Again volume loss from the left-sided lobectomy. There is some thickening along bronchi in this location which is increasing from previous slightly. Upper Abdomen: Stable nodular thickening of the adrenal glands bilaterally. Wall thickening of the stomach again seen the stomach is contracted. Musculoskeletal: Scattered degenerative changes of the spine. Scattered sclerotic areas along the thoracic spine, unchanged. Anasarca IMPRESSION: Progression of lymph nodes in the hilum, mediastinum and left axillary region. Stable surgical changes from lobectomy on the left side. Slight increasing such as thickening at the hilum involving bronchi centrally. Multiple small lung nodules in the right side, unchanged from previous. Recommend continued surveillance. Aortic Atherosclerosis (ICD10-I70.0) and Emphysema (ICD10-J43.9). Electronically Signed   By: Jill Side M.D.   On: 05/03/2022 14:01     ASSESSMENT/PLAN:  This is a very pleasant 72 year old Caucasian male diagnosed with recurrent disease but he was initially diagnosed as stage IIIb (T1a, N1, M0) non-small cell lung cancer, adenocarcinoma.  He was initially diagnosed in June 2021 and is status post left upper lobectomy with lymph node dissection on 12/16/2019 to the care of Dr. Kipp Brood.  The patient has no actionable mutations and his PD-L1 expression is 1%.  For his lung cancer, the patient initially completed adjuvant systemic chemotherapy with cisplatin 75 mg/m and Alimta 500 mg/m IV every 3 weeks status post 4 cycles.   For the pelvic GIST tumor, the patient completed neoadjuvant treatment with imatinib 400 mg p.o. daily in April 2023.  Status post 10 months. He patient underwent surgical resection of the remaining gastrointestinal stromal tumor under the care of Dr. Johney Maine on December 14, 2021 with very minimal residual disease.  He resumed his treatment with imatinib and tolerating it fairly well.   In  November 2023,  his restaging CT scan for his lung cancer showed increased soft tissue density in the left hilar region concerning for possible developing lymphadenopathy or disease recurrence.  A PET scan was performed and showed new hypermetabolic left axillary, mediastinal, and left hilar adenopathy compatible with disease recurrence.  He underwent excisional biopsy of left axillary lymph node which was not conclusive of malignancy.  Therefore Dr. Valeta Harms performed a repeat video bronchoscopy with EBUS on 04/11/2022 and the biopsy from the station 7 lymph node showed recurrent adenocarcinoma.  Therefore the patient is currently undergoing concurrent chemoradiation with carboplatin for an AUC of 2 and paclitaxel 45 mg/m.  His first dose was on 05/14/2022.  The patient is status post 2 cycles and has been tolerating this fairly well.  Patient has been having trouble with diarrhea ever since having his ileostomy takedown in early December 2023.  He is taking Lomotil, Imodium, and recently started cholestyramine.   Labs were reviewed.  Patient states that he has been tolerating chemotherapy without any appreciable change in his diarrhea, nausea, decreased appetite started prior to chemotherapy with the ileostomy takedown.  Labs are reviewed.  He will proceed with cycle #3 today's schedule.  We will arrange for 1 L fluid while in the infusion room today over 2 hours.  I gave the patient handout of the food choices to help relieve diarrhea.  He will continue to follow-up with Dr. Johney Maine.  Dr. Johney Maine reportedly already arranged for a GI pathogen panel and C. difficile testing.  In addition to the Megace, I have also prescribed Remeron 15 mg p.o. nightly to help with the decreased appetite.  She understands this may take a few weeks to notice improvement  Have also referred the patient to member the nutritionist team.  I prescribed him Carafate to help with the odynophagia and dysphagia.  Further  recommendations regarding swallowing concerns related to radiation the patient will follow-up with Dr. Sondra Come.  Was also encouraged to increase his protein supplemental drinks to 2-3 times per day  We will see him back for follow-up visit in 2 weeks for evaluation repeat blood work before undergoing cycle #5.  The patient was advised to call immediately if he has any concerning symptoms in the interval. The patient voices understanding of current disease status and treatment options and is in agreement with the current care plan. All questions were answered. The patient knows to call the clinic with any problems, questions or concerns. We can certainly see the patient much sooner if necessary      Orders Placed This Encounter  Procedures   Ambulatory Referral to [facility] Nutrition    Referral Priority:   Routine    Referral Type:   Consultation    Referral Reason:   Specialty Services Required    Number of Visits Requested:   1     The total time spent in the appointment was 30-39 minutes.   Harshan Kearley L Avigayil Ton, PA-C 05/28/22

## 2022-05-28 ENCOUNTER — Inpatient Hospital Stay: Payer: Medicare Other

## 2022-05-28 ENCOUNTER — Inpatient Hospital Stay (HOSPITAL_BASED_OUTPATIENT_CLINIC_OR_DEPARTMENT_OTHER): Payer: Medicare Other | Admitting: Physician Assistant

## 2022-05-28 ENCOUNTER — Ambulatory Visit
Admission: RE | Admit: 2022-05-28 | Discharge: 2022-05-28 | Disposition: A | Discharge status: Home / Self Care | Payer: Medicare Other | Source: Ambulatory Visit | Attending: Radiation Oncology | Admitting: Radiation Oncology

## 2022-05-28 ENCOUNTER — Other Ambulatory Visit (HOSPITAL_COMMUNITY): Payer: Self-pay

## 2022-05-28 ENCOUNTER — Other Ambulatory Visit: Payer: Self-pay

## 2022-05-28 VITALS — BP 131/71 | HR 96 | Temp 97.7°F | Resp 15 | Ht 69.0 in | Wt 141.0 lb

## 2022-05-28 DIAGNOSIS — C3490 Malignant neoplasm of unspecified part of unspecified bronchus or lung: Secondary | ICD-10-CM

## 2022-05-28 DIAGNOSIS — Z79899 Other long term (current) drug therapy: Secondary | ICD-10-CM | POA: Diagnosis not present

## 2022-05-28 DIAGNOSIS — R112 Nausea with vomiting, unspecified: Secondary | ICD-10-CM | POA: Diagnosis not present

## 2022-05-28 DIAGNOSIS — C771 Secondary and unspecified malignant neoplasm of intrathoracic lymph nodes: Secondary | ICD-10-CM | POA: Diagnosis not present

## 2022-05-28 DIAGNOSIS — R634 Abnormal weight loss: Secondary | ICD-10-CM

## 2022-05-28 DIAGNOSIS — Z5111 Encounter for antineoplastic chemotherapy: Secondary | ICD-10-CM | POA: Diagnosis not present

## 2022-05-28 DIAGNOSIS — C3412 Malignant neoplasm of upper lobe, left bronchus or lung: Secondary | ICD-10-CM | POA: Diagnosis not present

## 2022-05-28 DIAGNOSIS — I10 Essential (primary) hypertension: Secondary | ICD-10-CM | POA: Diagnosis not present

## 2022-05-28 DIAGNOSIS — Z510 Encounter for antineoplastic radiation therapy: Secondary | ICD-10-CM | POA: Diagnosis not present

## 2022-05-28 DIAGNOSIS — R197 Diarrhea, unspecified: Secondary | ICD-10-CM | POA: Diagnosis not present

## 2022-05-28 DIAGNOSIS — R1310 Dysphagia, unspecified: Secondary | ICD-10-CM | POA: Diagnosis not present

## 2022-05-28 DIAGNOSIS — E876 Hypokalemia: Secondary | ICD-10-CM | POA: Diagnosis not present

## 2022-05-28 DIAGNOSIS — F17210 Nicotine dependence, cigarettes, uncomplicated: Secondary | ICD-10-CM | POA: Diagnosis not present

## 2022-05-28 LAB — CBC WITH DIFFERENTIAL (CANCER CENTER ONLY)
Abs Immature Granulocytes: 0.01 10*3/uL (ref 0.00–0.07)
Basophils Absolute: 0 10*3/uL (ref 0.0–0.1)
Basophils Relative: 0 %
Eosinophils Absolute: 0 10*3/uL (ref 0.0–0.5)
Eosinophils Relative: 0 %
HCT: 29.9 % — ABNORMAL LOW (ref 39.0–52.0)
Hemoglobin: 10.5 g/dL — ABNORMAL LOW (ref 13.0–17.0)
Immature Granulocytes: 0 %
Lymphocytes Relative: 13 %
Lymphs Abs: 0.5 10*3/uL — ABNORMAL LOW (ref 0.7–4.0)
MCH: 32.5 pg (ref 26.0–34.0)
MCHC: 35.1 g/dL (ref 30.0–36.0)
MCV: 92.6 fL (ref 80.0–100.0)
Monocytes Absolute: 0.2 10*3/uL (ref 0.1–1.0)
Monocytes Relative: 5 %
Neutro Abs: 3 10*3/uL (ref 1.7–7.7)
Neutrophils Relative %: 82 %
Platelet Count: 227 10*3/uL (ref 150–400)
RBC: 3.23 MIL/uL — ABNORMAL LOW (ref 4.22–5.81)
RDW: 13.8 % (ref 11.5–15.5)
WBC Count: 3.7 10*3/uL — ABNORMAL LOW (ref 4.0–10.5)
nRBC: 0 % (ref 0.0–0.2)

## 2022-05-28 LAB — CMP (CANCER CENTER ONLY)
ALT: 20 U/L (ref 0–44)
AST: 18 U/L (ref 15–41)
Albumin: 3.8 g/dL (ref 3.5–5.0)
Alkaline Phosphatase: 83 U/L (ref 38–126)
Anion gap: 9 (ref 5–15)
BUN: 22 mg/dL (ref 8–23)
CO2: 20 mmol/L — ABNORMAL LOW (ref 22–32)
Calcium: 9.1 mg/dL (ref 8.9–10.3)
Chloride: 108 mmol/L (ref 98–111)
Creatinine: 0.75 mg/dL (ref 0.61–1.24)
GFR, Estimated: >60 mL/min (ref >60)
Glucose, Bld: 102 mg/dL — ABNORMAL HIGH (ref 70–99)
Potassium: 3.4 mmol/L — ABNORMAL LOW (ref 3.5–5.1)
Sodium: 137 mmol/L (ref 135–145)
Total Bilirubin: 0.4 mg/dL (ref 0.3–1.2)
Total Protein: 6.4 g/dL — ABNORMAL LOW (ref 6.5–8.1)

## 2022-05-28 LAB — RAD ONC ARIA SESSION SUMMARY
Course Elapsed Days: 13
Plan Fractions Treated to Date: 10
Plan Prescribed Dose Per Fraction: 2 Gy
Plan Total Fractions Prescribed: 30
Plan Total Prescribed Dose: 60 Gy
Reference Point Dosage Given to Date: 20 Gy
Reference Point Session Dosage Given: 2 Gy
Session Number: 10

## 2022-05-28 MED ORDER — SODIUM CHLORIDE 0.9 % IV SOLN
177.6000 mg | Freq: Once | INTRAVENOUS | Status: AC
  Administered 2022-05-28: 180 mg via INTRAVENOUS
  Filled 2022-05-28: qty 18

## 2022-05-28 MED ORDER — PALONOSETRON HCL INJECTION 0.25 MG/5ML
0.2500 mg | Freq: Once | INTRAVENOUS | Status: AC
  Administered 2022-05-28: 0.25 mg via INTRAVENOUS
  Filled 2022-05-28: qty 5

## 2022-05-28 MED ORDER — FAMOTIDINE IN NACL 20-0.9 MG/50ML-% IV SOLN
20.0000 mg | Freq: Once | INTRAVENOUS | Status: AC
  Administered 2022-05-28: 20 mg via INTRAVENOUS
  Filled 2022-05-28: qty 50

## 2022-05-28 MED ORDER — SODIUM CHLORIDE 0.9 % IV SOLN: Freq: Once | INTRAVENOUS | Status: AC

## 2022-05-28 MED ORDER — SODIUM CHLORIDE 0.9 % IV SOLN
45.0000 mg/m2 | Freq: Once | INTRAVENOUS | Status: AC
  Administered 2022-05-28: 84 mg via INTRAVENOUS
  Filled 2022-05-28: qty 14

## 2022-05-28 MED ORDER — SODIUM CHLORIDE 0.9 % IV SOLN
10.0000 mg | Freq: Once | INTRAVENOUS | Status: AC
  Administered 2022-05-28: 10 mg via INTRAVENOUS
  Filled 2022-05-28: qty 10

## 2022-05-28 MED ORDER — SUCRALFATE 1 G PO TABS
1.0000 g | ORAL_TABLET | Freq: Three times a day (TID) | ORAL | 1 refills | Status: DC
  Filled 2022-05-28: qty 60, 15d supply, fill #0

## 2022-05-28 MED ORDER — DIPHENHYDRAMINE HCL 50 MG/ML IJ SOLN
50.0000 mg | Freq: Once | INTRAMUSCULAR | Status: AC
  Administered 2022-05-28: 50 mg via INTRAVENOUS
  Filled 2022-05-28: qty 1

## 2022-05-28 MED ORDER — MIRTAZAPINE 15 MG PO TABS
15.0000 mg | ORAL_TABLET | Freq: Every day | ORAL | 2 refills | Status: DC
  Filled 2022-05-28: qty 30, 30d supply, fill #0

## 2022-05-29 ENCOUNTER — Other Ambulatory Visit: Payer: Self-pay

## 2022-05-29 ENCOUNTER — Ambulatory Visit
Admission: RE | Admit: 2022-05-29 | Discharge: 2022-05-29 | Disposition: A | Discharge status: Home / Self Care | Payer: Medicare Other | Source: Ambulatory Visit | Attending: Radiation Oncology | Admitting: Radiation Oncology

## 2022-05-29 DIAGNOSIS — Z5111 Encounter for antineoplastic chemotherapy: Secondary | ICD-10-CM | POA: Diagnosis not present

## 2022-05-29 DIAGNOSIS — R112 Nausea with vomiting, unspecified: Secondary | ICD-10-CM | POA: Diagnosis not present

## 2022-05-29 DIAGNOSIS — R197 Diarrhea, unspecified: Secondary | ICD-10-CM | POA: Diagnosis not present

## 2022-05-29 DIAGNOSIS — R1310 Dysphagia, unspecified: Secondary | ICD-10-CM | POA: Diagnosis not present

## 2022-05-29 DIAGNOSIS — Z79899 Other long term (current) drug therapy: Secondary | ICD-10-CM | POA: Diagnosis not present

## 2022-05-29 DIAGNOSIS — C771 Secondary and unspecified malignant neoplasm of intrathoracic lymph nodes: Secondary | ICD-10-CM | POA: Diagnosis not present

## 2022-05-29 DIAGNOSIS — I10 Essential (primary) hypertension: Secondary | ICD-10-CM | POA: Diagnosis not present

## 2022-05-29 DIAGNOSIS — Z510 Encounter for antineoplastic radiation therapy: Secondary | ICD-10-CM | POA: Diagnosis not present

## 2022-05-29 DIAGNOSIS — E876 Hypokalemia: Secondary | ICD-10-CM | POA: Diagnosis not present

## 2022-05-29 DIAGNOSIS — C3412 Malignant neoplasm of upper lobe, left bronchus or lung: Secondary | ICD-10-CM | POA: Diagnosis not present

## 2022-05-29 DIAGNOSIS — F17210 Nicotine dependence, cigarettes, uncomplicated: Secondary | ICD-10-CM | POA: Diagnosis not present

## 2022-05-29 LAB — RAD ONC ARIA SESSION SUMMARY
Course Elapsed Days: 14
Plan Fractions Treated to Date: 11
Plan Prescribed Dose Per Fraction: 2 Gy
Plan Total Fractions Prescribed: 30
Plan Total Prescribed Dose: 60 Gy
Reference Point Dosage Given to Date: 22 Gy
Reference Point Session Dosage Given: 2 Gy
Session Number: 11

## 2022-05-30 ENCOUNTER — Other Ambulatory Visit: Payer: Self-pay

## 2022-05-30 ENCOUNTER — Ambulatory Visit
Admission: RE | Admit: 2022-05-30 | Discharge: 2022-05-30 | Disposition: A | Discharge status: Home / Self Care | Payer: Medicare Other | Source: Ambulatory Visit | Attending: Radiation Oncology | Admitting: Radiation Oncology

## 2022-05-30 DIAGNOSIS — Z79899 Other long term (current) drug therapy: Secondary | ICD-10-CM | POA: Diagnosis not present

## 2022-05-30 DIAGNOSIS — R197 Diarrhea, unspecified: Secondary | ICD-10-CM | POA: Diagnosis not present

## 2022-05-30 DIAGNOSIS — C771 Secondary and unspecified malignant neoplasm of intrathoracic lymph nodes: Secondary | ICD-10-CM | POA: Diagnosis not present

## 2022-05-30 DIAGNOSIS — Z5111 Encounter for antineoplastic chemotherapy: Secondary | ICD-10-CM | POA: Diagnosis not present

## 2022-05-30 DIAGNOSIS — Z510 Encounter for antineoplastic radiation therapy: Secondary | ICD-10-CM | POA: Diagnosis not present

## 2022-05-30 DIAGNOSIS — E876 Hypokalemia: Secondary | ICD-10-CM | POA: Diagnosis not present

## 2022-05-30 DIAGNOSIS — C3412 Malignant neoplasm of upper lobe, left bronchus or lung: Secondary | ICD-10-CM | POA: Diagnosis not present

## 2022-05-30 DIAGNOSIS — R1310 Dysphagia, unspecified: Secondary | ICD-10-CM | POA: Diagnosis not present

## 2022-05-30 DIAGNOSIS — I10 Essential (primary) hypertension: Secondary | ICD-10-CM | POA: Diagnosis not present

## 2022-05-30 DIAGNOSIS — R112 Nausea with vomiting, unspecified: Secondary | ICD-10-CM | POA: Diagnosis not present

## 2022-05-30 DIAGNOSIS — F17210 Nicotine dependence, cigarettes, uncomplicated: Secondary | ICD-10-CM | POA: Diagnosis not present

## 2022-05-30 LAB — RAD ONC ARIA SESSION SUMMARY
Course Elapsed Days: 15
Plan Fractions Treated to Date: 12
Plan Prescribed Dose Per Fraction: 2 Gy
Plan Total Fractions Prescribed: 30
Plan Total Prescribed Dose: 60 Gy
Reference Point Dosage Given to Date: 24 Gy
Reference Point Session Dosage Given: 2 Gy
Session Number: 12

## 2022-05-31 ENCOUNTER — Other Ambulatory Visit: Payer: Self-pay

## 2022-05-31 ENCOUNTER — Ambulatory Visit
Admission: RE | Admit: 2022-05-31 | Discharge: 2022-05-31 | Disposition: A | Discharge status: Home / Self Care | Payer: Medicare Other | Source: Ambulatory Visit | Attending: Radiation Oncology | Admitting: Radiation Oncology

## 2022-05-31 DIAGNOSIS — I10 Essential (primary) hypertension: Secondary | ICD-10-CM | POA: Diagnosis not present

## 2022-05-31 DIAGNOSIS — R1310 Dysphagia, unspecified: Secondary | ICD-10-CM | POA: Diagnosis not present

## 2022-05-31 DIAGNOSIS — Z510 Encounter for antineoplastic radiation therapy: Secondary | ICD-10-CM | POA: Diagnosis not present

## 2022-05-31 DIAGNOSIS — Z79899 Other long term (current) drug therapy: Secondary | ICD-10-CM | POA: Diagnosis not present

## 2022-05-31 DIAGNOSIS — R112 Nausea with vomiting, unspecified: Secondary | ICD-10-CM | POA: Diagnosis not present

## 2022-05-31 DIAGNOSIS — C771 Secondary and unspecified malignant neoplasm of intrathoracic lymph nodes: Secondary | ICD-10-CM | POA: Diagnosis not present

## 2022-05-31 DIAGNOSIS — R197 Diarrhea, unspecified: Secondary | ICD-10-CM | POA: Diagnosis not present

## 2022-05-31 DIAGNOSIS — F17210 Nicotine dependence, cigarettes, uncomplicated: Secondary | ICD-10-CM | POA: Diagnosis not present

## 2022-05-31 DIAGNOSIS — C3412 Malignant neoplasm of upper lobe, left bronchus or lung: Secondary | ICD-10-CM | POA: Diagnosis not present

## 2022-05-31 DIAGNOSIS — Z5111 Encounter for antineoplastic chemotherapy: Secondary | ICD-10-CM | POA: Diagnosis not present

## 2022-05-31 DIAGNOSIS — E876 Hypokalemia: Secondary | ICD-10-CM | POA: Diagnosis not present

## 2022-05-31 LAB — RAD ONC ARIA SESSION SUMMARY
Course Elapsed Days: 16
Plan Fractions Treated to Date: 13
Plan Prescribed Dose Per Fraction: 2 Gy
Plan Total Fractions Prescribed: 30
Plan Total Prescribed Dose: 60 Gy
Reference Point Dosage Given to Date: 26 Gy
Reference Point Session Dosage Given: 2 Gy
Session Number: 13

## 2022-06-01 ENCOUNTER — Ambulatory Visit
Admission: RE | Admit: 2022-06-01 | Discharge: 2022-06-01 | Disposition: A | Discharge status: Home / Self Care | Payer: Medicare Other | Source: Ambulatory Visit | Attending: Radiation Oncology | Admitting: Radiation Oncology

## 2022-06-01 ENCOUNTER — Other Ambulatory Visit: Payer: Self-pay

## 2022-06-01 DIAGNOSIS — Z510 Encounter for antineoplastic radiation therapy: Secondary | ICD-10-CM | POA: Diagnosis not present

## 2022-06-01 DIAGNOSIS — Z79899 Other long term (current) drug therapy: Secondary | ICD-10-CM | POA: Diagnosis not present

## 2022-06-01 DIAGNOSIS — I10 Essential (primary) hypertension: Secondary | ICD-10-CM | POA: Diagnosis not present

## 2022-06-01 DIAGNOSIS — R1310 Dysphagia, unspecified: Secondary | ICD-10-CM | POA: Diagnosis not present

## 2022-06-01 DIAGNOSIS — Z5111 Encounter for antineoplastic chemotherapy: Secondary | ICD-10-CM | POA: Diagnosis not present

## 2022-06-01 DIAGNOSIS — E876 Hypokalemia: Secondary | ICD-10-CM | POA: Diagnosis not present

## 2022-06-01 DIAGNOSIS — R112 Nausea with vomiting, unspecified: Secondary | ICD-10-CM | POA: Diagnosis not present

## 2022-06-01 DIAGNOSIS — R197 Diarrhea, unspecified: Secondary | ICD-10-CM | POA: Diagnosis not present

## 2022-06-01 DIAGNOSIS — F17210 Nicotine dependence, cigarettes, uncomplicated: Secondary | ICD-10-CM | POA: Diagnosis not present

## 2022-06-01 DIAGNOSIS — C3412 Malignant neoplasm of upper lobe, left bronchus or lung: Secondary | ICD-10-CM | POA: Diagnosis not present

## 2022-06-01 DIAGNOSIS — C771 Secondary and unspecified malignant neoplasm of intrathoracic lymph nodes: Secondary | ICD-10-CM | POA: Diagnosis not present

## 2022-06-01 LAB — RAD ONC ARIA SESSION SUMMARY
Course Elapsed Days: 17
Plan Fractions Treated to Date: 14
Plan Prescribed Dose Per Fraction: 2 Gy
Plan Total Fractions Prescribed: 30
Plan Total Prescribed Dose: 60 Gy
Reference Point Dosage Given to Date: 28 Gy
Reference Point Session Dosage Given: 2 Gy
Session Number: 14

## 2022-06-01 MED FILL — Dexamethasone Sodium Phosphate Inj 100 MG/10ML: INTRAMUSCULAR | Qty: 1 | Status: AC

## 2022-06-04 ENCOUNTER — Other Ambulatory Visit: Payer: Self-pay | Admitting: Physician Assistant

## 2022-06-04 ENCOUNTER — Ambulatory Visit
Admission: RE | Admit: 2022-06-04 | Discharge: 2022-06-04 | Disposition: A | Discharge status: Home / Self Care | Payer: Medicare Other | Source: Ambulatory Visit | Attending: Radiation Oncology | Admitting: Radiation Oncology

## 2022-06-04 ENCOUNTER — Ambulatory Visit: Payer: Medicare Other

## 2022-06-04 ENCOUNTER — Inpatient Hospital Stay: Payer: Medicare Other

## 2022-06-04 ENCOUNTER — Other Ambulatory Visit: Payer: Self-pay

## 2022-06-04 ENCOUNTER — Other Ambulatory Visit: Payer: Self-pay | Admitting: Medical Oncology

## 2022-06-04 DIAGNOSIS — Z431 Encounter for attention to gastrostomy: Secondary | ICD-10-CM | POA: Diagnosis not present

## 2022-06-04 DIAGNOSIS — I5022 Chronic systolic (congestive) heart failure: Secondary | ICD-10-CM | POA: Diagnosis not present

## 2022-06-04 DIAGNOSIS — J150 Pneumonia due to Klebsiella pneumoniae: Secondary | ICD-10-CM | POA: Diagnosis present

## 2022-06-04 DIAGNOSIS — A09 Infectious gastroenteritis and colitis, unspecified: Secondary | ICD-10-CM | POA: Diagnosis not present

## 2022-06-04 DIAGNOSIS — I6389 Other cerebral infarction: Secondary | ICD-10-CM | POA: Diagnosis not present

## 2022-06-04 DIAGNOSIS — R6520 Severe sepsis without septic shock: Secondary | ICD-10-CM | POA: Diagnosis not present

## 2022-06-04 DIAGNOSIS — E876 Hypokalemia: Secondary | ICD-10-CM | POA: Diagnosis not present

## 2022-06-04 DIAGNOSIS — M419 Scoliosis, unspecified: Secondary | ICD-10-CM | POA: Diagnosis not present

## 2022-06-04 DIAGNOSIS — E46 Unspecified protein-calorie malnutrition: Secondary | ICD-10-CM | POA: Diagnosis not present

## 2022-06-04 DIAGNOSIS — Z510 Encounter for antineoplastic radiation therapy: Secondary | ICD-10-CM | POA: Diagnosis not present

## 2022-06-04 DIAGNOSIS — F17210 Nicotine dependence, cigarettes, uncomplicated: Secondary | ICD-10-CM | POA: Diagnosis not present

## 2022-06-04 DIAGNOSIS — C771 Secondary and unspecified malignant neoplasm of intrathoracic lymph nodes: Secondary | ICD-10-CM | POA: Diagnosis not present

## 2022-06-04 DIAGNOSIS — I429 Cardiomyopathy, unspecified: Secondary | ICD-10-CM | POA: Diagnosis not present

## 2022-06-04 DIAGNOSIS — K222 Esophageal obstruction: Secondary | ICD-10-CM | POA: Diagnosis not present

## 2022-06-04 DIAGNOSIS — C3490 Malignant neoplasm of unspecified part of unspecified bronchus or lung: Secondary | ICD-10-CM

## 2022-06-04 DIAGNOSIS — N179 Acute kidney failure, unspecified: Secondary | ICD-10-CM | POA: Diagnosis not present

## 2022-06-04 DIAGNOSIS — K2289 Other specified disease of esophagus: Secondary | ICD-10-CM | POA: Diagnosis not present

## 2022-06-04 DIAGNOSIS — J449 Chronic obstructive pulmonary disease, unspecified: Secondary | ICD-10-CM | POA: Diagnosis not present

## 2022-06-04 DIAGNOSIS — D702 Other drug-induced agranulocytosis: Secondary | ICD-10-CM | POA: Diagnosis not present

## 2022-06-04 DIAGNOSIS — R7881 Bacteremia: Secondary | ICD-10-CM | POA: Diagnosis not present

## 2022-06-04 DIAGNOSIS — E785 Hyperlipidemia, unspecified: Secondary | ICD-10-CM | POA: Diagnosis not present

## 2022-06-04 DIAGNOSIS — D61810 Antineoplastic chemotherapy induced pancytopenia: Secondary | ICD-10-CM | POA: Diagnosis not present

## 2022-06-04 DIAGNOSIS — K208 Other esophagitis without bleeding: Secondary | ICD-10-CM | POA: Diagnosis not present

## 2022-06-04 DIAGNOSIS — I10 Essential (primary) hypertension: Secondary | ICD-10-CM | POA: Diagnosis not present

## 2022-06-04 DIAGNOSIS — I4891 Unspecified atrial fibrillation: Secondary | ICD-10-CM | POA: Diagnosis not present

## 2022-06-04 DIAGNOSIS — N200 Calculus of kidney: Secondary | ICD-10-CM | POA: Diagnosis not present

## 2022-06-04 DIAGNOSIS — Z7401 Bed confinement status: Secondary | ICD-10-CM | POA: Diagnosis not present

## 2022-06-04 DIAGNOSIS — R1310 Dysphagia, unspecified: Secondary | ICD-10-CM | POA: Diagnosis not present

## 2022-06-04 DIAGNOSIS — A4159 Other Gram-negative sepsis: Secondary | ICD-10-CM | POA: Diagnosis not present

## 2022-06-04 DIAGNOSIS — D509 Iron deficiency anemia, unspecified: Secondary | ICD-10-CM | POA: Diagnosis not present

## 2022-06-04 DIAGNOSIS — T451X5A Adverse effect of antineoplastic and immunosuppressive drugs, initial encounter: Secondary | ICD-10-CM | POA: Diagnosis not present

## 2022-06-04 DIAGNOSIS — R0602 Shortness of breath: Secondary | ICD-10-CM | POA: Diagnosis not present

## 2022-06-04 DIAGNOSIS — R64 Cachexia: Secondary | ICD-10-CM | POA: Diagnosis not present

## 2022-06-04 DIAGNOSIS — K219 Gastro-esophageal reflux disease without esophagitis: Secondary | ICD-10-CM | POA: Diagnosis not present

## 2022-06-04 DIAGNOSIS — I6349 Cerebral infarction due to embolism of other cerebral artery: Secondary | ICD-10-CM | POA: Diagnosis not present

## 2022-06-04 DIAGNOSIS — D696 Thrombocytopenia, unspecified: Secondary | ICD-10-CM | POA: Diagnosis not present

## 2022-06-04 DIAGNOSIS — R0902 Hypoxemia: Secondary | ICD-10-CM | POA: Diagnosis not present

## 2022-06-04 DIAGNOSIS — R230 Cyanosis: Secondary | ICD-10-CM | POA: Diagnosis not present

## 2022-06-04 DIAGNOSIS — R5383 Other fatigue: Secondary | ICD-10-CM | POA: Diagnosis not present

## 2022-06-04 DIAGNOSIS — E43 Unspecified severe protein-calorie malnutrition: Secondary | ICD-10-CM | POA: Diagnosis not present

## 2022-06-04 DIAGNOSIS — Z8673 Personal history of transient ischemic attack (TIA), and cerebral infarction without residual deficits: Secondary | ICD-10-CM | POA: Diagnosis not present

## 2022-06-04 DIAGNOSIS — B370 Candidal stomatitis: Secondary | ICD-10-CM | POA: Diagnosis not present

## 2022-06-04 DIAGNOSIS — C3412 Malignant neoplasm of upper lobe, left bronchus or lung: Secondary | ICD-10-CM | POA: Diagnosis not present

## 2022-06-04 DIAGNOSIS — R410 Disorientation, unspecified: Secondary | ICD-10-CM | POA: Diagnosis not present

## 2022-06-04 DIAGNOSIS — R531 Weakness: Secondary | ICD-10-CM | POA: Diagnosis not present

## 2022-06-04 DIAGNOSIS — R278 Other lack of coordination: Secondary | ICD-10-CM | POA: Diagnosis not present

## 2022-06-04 DIAGNOSIS — J440 Chronic obstructive pulmonary disease with acute lower respiratory infection: Secondary | ICD-10-CM | POA: Diagnosis not present

## 2022-06-04 DIAGNOSIS — I639 Cerebral infarction, unspecified: Secondary | ICD-10-CM | POA: Diagnosis not present

## 2022-06-04 DIAGNOSIS — R1319 Other dysphagia: Secondary | ICD-10-CM | POA: Diagnosis not present

## 2022-06-04 DIAGNOSIS — K449 Diaphragmatic hernia without obstruction or gangrene: Secondary | ICD-10-CM | POA: Diagnosis not present

## 2022-06-04 DIAGNOSIS — D630 Anemia in neoplastic disease: Secondary | ICD-10-CM | POA: Diagnosis not present

## 2022-06-04 DIAGNOSIS — I491 Atrial premature depolarization: Secondary | ICD-10-CM | POA: Diagnosis not present

## 2022-06-04 DIAGNOSIS — J9601 Acute respiratory failure with hypoxia: Secondary | ICD-10-CM | POA: Diagnosis not present

## 2022-06-04 DIAGNOSIS — D701 Agranulocytosis secondary to cancer chemotherapy: Secondary | ICD-10-CM | POA: Diagnosis not present

## 2022-06-04 DIAGNOSIS — G9341 Metabolic encephalopathy: Secondary | ICD-10-CM | POA: Diagnosis not present

## 2022-06-04 DIAGNOSIS — E872 Acidosis, unspecified: Secondary | ICD-10-CM | POA: Diagnosis not present

## 2022-06-04 DIAGNOSIS — B961 Klebsiella pneumoniae [K. pneumoniae] as the cause of diseases classified elsewhere: Secondary | ICD-10-CM | POA: Diagnosis not present

## 2022-06-04 DIAGNOSIS — R6339 Other feeding difficulties: Secondary | ICD-10-CM | POA: Diagnosis not present

## 2022-06-04 DIAGNOSIS — Z66 Do not resuscitate: Secondary | ICD-10-CM | POA: Diagnosis not present

## 2022-06-04 DIAGNOSIS — K6389 Other specified diseases of intestine: Secondary | ICD-10-CM | POA: Diagnosis not present

## 2022-06-04 DIAGNOSIS — C49A5 Gastrointestinal stromal tumor of rectum: Secondary | ICD-10-CM | POA: Diagnosis not present

## 2022-06-04 DIAGNOSIS — Y842 Radiological procedure and radiotherapy as the cause of abnormal reaction of the patient, or of later complication, without mention of misadventure at the time of the procedure: Secondary | ICD-10-CM | POA: Diagnosis present

## 2022-06-04 DIAGNOSIS — R488 Other symbolic dysfunctions: Secondary | ICD-10-CM | POA: Diagnosis not present

## 2022-06-04 DIAGNOSIS — R2689 Other abnormalities of gait and mobility: Secondary | ICD-10-CM | POA: Diagnosis not present

## 2022-06-04 DIAGNOSIS — J189 Pneumonia, unspecified organism: Secondary | ICD-10-CM | POA: Diagnosis not present

## 2022-06-04 DIAGNOSIS — M6281 Muscle weakness (generalized): Secondary | ICD-10-CM | POA: Diagnosis not present

## 2022-06-04 DIAGNOSIS — Z4659 Encounter for fitting and adjustment of other gastrointestinal appliance and device: Secondary | ICD-10-CM | POA: Diagnosis not present

## 2022-06-04 DIAGNOSIS — D61818 Other pancytopenia: Secondary | ICD-10-CM | POA: Diagnosis not present

## 2022-06-04 DIAGNOSIS — R000 Tachycardia, unspecified: Secondary | ICD-10-CM | POA: Diagnosis not present

## 2022-06-04 DIAGNOSIS — A0472 Enterocolitis due to Clostridium difficile, not specified as recurrent: Secondary | ICD-10-CM | POA: Diagnosis present

## 2022-06-04 DIAGNOSIS — G9340 Encephalopathy, unspecified: Secondary | ICD-10-CM | POA: Diagnosis not present

## 2022-06-04 DIAGNOSIS — E871 Hypo-osmolality and hyponatremia: Secondary | ICD-10-CM | POA: Diagnosis not present

## 2022-06-04 DIAGNOSIS — Z87891 Personal history of nicotine dependence: Secondary | ICD-10-CM | POA: Diagnosis not present

## 2022-06-04 DIAGNOSIS — C3492 Malignant neoplasm of unspecified part of left bronchus or lung: Secondary | ICD-10-CM | POA: Diagnosis not present

## 2022-06-04 DIAGNOSIS — Z1152 Encounter for screening for COVID-19: Secondary | ICD-10-CM | POA: Diagnosis not present

## 2022-06-04 DIAGNOSIS — Z515 Encounter for palliative care: Secondary | ICD-10-CM | POA: Diagnosis not present

## 2022-06-04 DIAGNOSIS — I2510 Atherosclerotic heart disease of native coronary artery without angina pectoris: Secondary | ICD-10-CM | POA: Diagnosis not present

## 2022-06-04 DIAGNOSIS — T66XXXA Radiation sickness, unspecified, initial encounter: Secondary | ICD-10-CM | POA: Diagnosis not present

## 2022-06-04 DIAGNOSIS — Z4682 Encounter for fitting and adjustment of non-vascular catheter: Secondary | ICD-10-CM | POA: Diagnosis not present

## 2022-06-04 DIAGNOSIS — E860 Dehydration: Secondary | ICD-10-CM | POA: Diagnosis not present

## 2022-06-04 DIAGNOSIS — R231 Pallor: Secondary | ICD-10-CM | POA: Diagnosis not present

## 2022-06-04 DIAGNOSIS — I110 Hypertensive heart disease with heart failure: Secondary | ICD-10-CM | POA: Diagnosis not present

## 2022-06-04 DIAGNOSIS — R0689 Other abnormalities of breathing: Secondary | ICD-10-CM | POA: Diagnosis not present

## 2022-06-04 DIAGNOSIS — Z7189 Other specified counseling: Secondary | ICD-10-CM | POA: Diagnosis not present

## 2022-06-04 DIAGNOSIS — R1314 Dysphagia, pharyngoesophageal phase: Secondary | ICD-10-CM | POA: Diagnosis not present

## 2022-06-04 DIAGNOSIS — A419 Sepsis, unspecified organism: Secondary | ICD-10-CM | POA: Diagnosis not present

## 2022-06-04 LAB — CMP (CANCER CENTER ONLY)
ALT: 46 U/L — ABNORMAL HIGH (ref 0–44)
AST: 83 U/L — ABNORMAL HIGH (ref 15–41)
Albumin: 3.2 g/dL — ABNORMAL LOW (ref 3.5–5.0)
Alkaline Phosphatase: 102 U/L (ref 38–126)
Anion gap: 11 (ref 5–15)
BUN: 40 mg/dL — ABNORMAL HIGH (ref 8–23)
CO2: 25 mmol/L (ref 22–32)
Calcium: 8.5 mg/dL — ABNORMAL LOW (ref 8.9–10.3)
Chloride: 96 mmol/L — ABNORMAL LOW (ref 98–111)
Creatinine: 1.09 mg/dL (ref 0.61–1.24)
GFR, Estimated: >60 mL/min (ref >60)
Glucose, Bld: 115 mg/dL — ABNORMAL HIGH (ref 70–99)
Potassium: 2.8 mmol/L — ABNORMAL LOW (ref 3.5–5.1)
Sodium: 132 mmol/L — ABNORMAL LOW (ref 135–145)
Total Bilirubin: 0.8 mg/dL (ref 0.3–1.2)
Total Protein: 6 g/dL — ABNORMAL LOW (ref 6.5–8.1)

## 2022-06-04 LAB — CBC WITH DIFFERENTIAL (CANCER CENTER ONLY)
Abs Immature Granulocytes: 0.02 10*3/uL (ref 0.00–0.07)
Basophils Absolute: 0 10*3/uL (ref 0.0–0.1)
Basophils Relative: 0 %
Eosinophils Absolute: 0 10*3/uL (ref 0.0–0.5)
Eosinophils Relative: 0 %
HCT: 28 % — ABNORMAL LOW (ref 39.0–52.0)
Hemoglobin: 9.9 g/dL — ABNORMAL LOW (ref 13.0–17.0)
Immature Granulocytes: 1 %
Lymphocytes Relative: 4 %
Lymphs Abs: 0.1 10*3/uL — ABNORMAL LOW (ref 0.7–4.0)
MCH: 31.2 pg (ref 26.0–34.0)
MCHC: 35.4 g/dL (ref 30.0–36.0)
MCV: 88.3 fL (ref 80.0–100.0)
Monocytes Absolute: 0.1 10*3/uL (ref 0.1–1.0)
Monocytes Relative: 6 %
Neutro Abs: 1.7 10*3/uL (ref 1.7–7.7)
Neutrophils Relative %: 89 %
Platelet Count: 85 10*3/uL — ABNORMAL LOW (ref 150–400)
RBC: 3.17 MIL/uL — ABNORMAL LOW (ref 4.22–5.81)
RDW: 14.2 % (ref 11.5–15.5)
WBC Count: 1.9 10*3/uL — ABNORMAL LOW (ref 4.0–10.5)
nRBC: 0 % (ref 0.0–0.2)

## 2022-06-04 LAB — RAD ONC ARIA SESSION SUMMARY
Course Elapsed Days: 20
Plan Fractions Treated to Date: 15
Plan Prescribed Dose Per Fraction: 2 Gy
Plan Total Fractions Prescribed: 30
Plan Total Prescribed Dose: 60 Gy
Reference Point Dosage Given to Date: 30 Gy
Reference Point Session Dosage Given: 2 Gy
Session Number: 15

## 2022-06-04 MED ORDER — POTASSIUM CHLORIDE 10 MEQ/100ML IV SOLN
10.0000 meq | INTRAVENOUS | Status: AC
  Administered 2022-06-04 (×4): 10 meq via INTRAVENOUS
  Filled 2022-06-04 (×4): qty 100

## 2022-06-04 MED ORDER — POTASSIUM CHLORIDE CRYS ER 20 MEQ PO TBCR: 20.0000 meq | EXTENDED_RELEASE_TABLET | Freq: Every day | ORAL | 0 refills | Status: DC

## 2022-06-04 MED ORDER — SODIUM CHLORIDE 0.9 % IV SOLN: INTRAVENOUS | Status: DC

## 2022-06-04 NOTE — Progress Notes (Signed)
At 1656 Pt's IV pump alarmed "occluded", this RN assessed Pt's PIV site. Upon assessment no discoloration at site was present, minimal swelling present, but Pt did have a little pain upon flushing, minimal blood return was noted and no resistance was noted at the time of the flush. Swelling around PIV site was approximately the size of a quarter and again no discoloration was noted. This RN notified Cassie PA.  Per Cassie PA OK to discontinue infusion after 3 runs of potassium chloride instead of 4 runs; no need to start a new PIV site. Prescription for oral potassium was sent in to pharmacy. This RN made Pt aware, Pt agreeable to stop infusion early. [place] NT removed Pt's PIV. This RN called and spoke with Pt's wife [name]. Pt's wife was educated to take Pt to [facility] if Pt starts to present with Stroke like symptoms. Pt's wife was educated on the signs and symptoms of hypokalemia and to call with any concerns regarding Pt's condition. Pt's wife verbalized understanding and was agreeable to education.

## 2022-06-04 NOTE — Patient Instructions (Signed)
Potassium Chloride Injection What is this medication? POTASSIUM CHLORIDE (poe TASS i um KLOOR ide) prevents and treats low levels of potassium in your body. Potassium plays an important role in maintaining the health of your kidneys, heart, muscles, and nervous system. This medicine may be used for other purposes; ask your health care provider or pharmacist if you have questions. COMMON BRAND NAME(S): PROAMP What should I tell my care team before I take this medication? They need to know if you have any of these conditions: Addison disease Dehydration Diabetes (high blood sugar) Heart disease High levels of potassium in the blood Irregular heartbeat or rhythm Kidney disease Large areas of burned skin An unusual or allergic reaction to potassium, other medications, foods, dyes, or preservatives Pregnant or trying to get pregnant Breast-feeding How should I use this medication? This medication is injected into a vein. It is given in a hospital or clinic setting. Talk to your care team about the use of this medication in children. Special care may be needed. Overdosage: If you think you have taken too much of this medicine contact a poison control center or emergency room at once. NOTE: This medicine is only for you. Do not share this medicine with others. What if I miss a dose? This does not apply. This medication is not for regular use. What may interact with this medication? Do not take this medication with any of the following: Certain diuretics, such as spironolactone, triamterene Eplerenone Sodium polystyrene sulfonate This medication may also interact with the following: Certain medications for blood pressure or heart disease, such as lisinopril, losartan, quinapril, valsartan Medications that lower your chance of fighting infection, such as cyclosporine, tacrolimus NSAIDs, medications for pain and inflammation, such as ibuprofen or naproxen Other potassium supplements Salt  substitutes This list may not describe all possible interactions. Give your health care provider a list of all the medicines, herbs, non-prescription drugs, or dietary supplements you use. Also tell them if you smoke, drink alcohol, or use illegal drugs. Some items may interact with your medicine. What should I watch for while using this medication? Visit your care team for regular checks on your progress. Tell your care team if your symptoms do not start to get better or if they get worse. You may need blood work while you are taking this medication. Avoid salt substitutes unless you are told otherwise by your care team. What side effects may I notice from receiving this medication? Side effects that you should report to your care team as soon as possible: Allergic reactions--skin rash, itching, hives, swelling of the face, lips, tongue, or throat High potassium level--muscle weakness, fast or irregular heartbeat Side effects that usually do not require medical attention (report to your care team if they continue or are bothersome): Diarrhea Nausea Stomach pain Vomiting This list may not describe all possible side effects. Call your doctor for medical advice about side effects. You may report side effects to FDA at 1-800-FDA-1088. Where should I keep my medication? This medication is given in a hospital or clinic. It will not be stored at home. NOTE: This sheet is a summary. It may not cover all possible information. If you have questions about this medicine, talk to your doctor, pharmacist, or health care provider.  2023 Elsevier/Gold Standard (2020-07-14 00:00:00)

## 2022-06-04 NOTE — Progress Notes (Signed)
Patient platelets are 85, AST 83 and has had 6lbs weight loss in 7 days- per Dr. Julien Nordmann no chemotherapy today. Patient potassium is 2.8, so potassium replacement instead. 94meq IV given over 4 hours.  At 1521 patient was reaching around at the IV pole with mild disorientation and accidentally pulled his R FA IV site out. Talked to patient and he seemed baseline. New IV placed and potassium restarted. Patient was assessed due to concern about affect and mentation related to the IV site removal. Patient answered most questions properly (time, birth date, president location), no overt physical symptoms, but never regained orientation to month of the year. Went over to Dr. Worthy Flank office and discussed with MD. MD checked to see if recent MRI of the brain with mets. None present. Patient had been improving before RN went to see the doctor. Dr. Julien Nordmann stated that if it became worse he would come see the patient. He also stated to talk to patient's wife when she came to pick him up -message to take [facility] to [facility] to check for stroke if symptoms return. Patient was not confused prior to treatment beginning. MD aware of assessment.  1600- Just had a complete conversation with patient including date, time, month of the year etc. Patient seems baseline to when he came in.

## 2022-06-04 NOTE — Progress Notes (Signed)
Nutrition Assessment   Reason for Assessment:  Add on to scheduled today with 6lb weight loss in 7 days.    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  Past medical history of lobectomy, lower anterior resection with coloanal anastomosis, diverting ileostomy, ileostomy takedown Dec 2023.    Met with patient during fluids and K infusion. No family members present. Patient uncomfortable in recliner, does not feel well, hard to get information.  Says that diarrhea was better but now back again.  Has been trying to take medications to help stop the diarrhea.  Says that he has been drinking boost shakes.  Noticed yogurt at chairside 1/2 eaten.  Reports that wife helps him prepare meals   Medications: lomotil, imodium, questran, megace, remeron, prilosec, carafate   Labs: K 2.8   Anthropometrics:   Height: 69 inches Weight: 135 lb 12 oz  151 lb on 12/27 BMI: 20  11% weight loss in the last 2 months, significant   Estimated Energy Needs  Kcals: 1800-2100 Protein: 90-105 g Fluid: 1800-2100 ml   NUTRITION DIAGNOSIS: Inadequate oral intake related to altered GI function (diarrhea) as evidenced by 11% weight loss in the last 2 months and decreased oral intake    INTERVENTION:  Reviewed foods that help thicken stool.  Handout provided Encouraged psyllium fiber use to help thicken stool as well.  Written information provided Contact information provided Will need ongoing support   MONITORING, EVALUATION, GOAL: weight trends, intake   Next Visit: Monday, Feb 26 during infusion  Naraya Stoneberg B. Zenia Resides, [place], [place] Registered Dietitian [phone]

## 2022-06-05 ENCOUNTER — Other Ambulatory Visit: Payer: Self-pay

## 2022-06-05 ENCOUNTER — Ambulatory Visit
Admission: RE | Admit: 2022-06-05 | Discharge: 2022-06-05 | Disposition: A | Discharge status: Home / Self Care | Payer: Medicare Other | Source: Ambulatory Visit | Attending: Radiation Oncology | Admitting: Radiation Oncology

## 2022-06-05 ENCOUNTER — Telehealth: Payer: Self-pay

## 2022-06-05 ENCOUNTER — Inpatient Hospital Stay (HOSPITAL_COMMUNITY)
Admission: EM | Admit: 2022-06-05 | Discharge: 2022-07-06 | DRG: 871 | Disposition: A | Discharge status: Skilled Nursing Facility | Payer: Medicare Other | Attending: Family Medicine | Admitting: Family Medicine

## 2022-06-05 ENCOUNTER — Emergency Department (HOSPITAL_COMMUNITY): Payer: Medicare Other

## 2022-06-05 ENCOUNTER — Encounter (HOSPITAL_COMMUNITY): Payer: Self-pay

## 2022-06-05 DIAGNOSIS — A419 Sepsis, unspecified organism: Secondary | ICD-10-CM | POA: Diagnosis not present

## 2022-06-05 DIAGNOSIS — E860 Dehydration: Secondary | ICD-10-CM | POA: Diagnosis not present

## 2022-06-05 DIAGNOSIS — A4159 Other Gram-negative sepsis: Principal | ICD-10-CM | POA: Diagnosis present

## 2022-06-05 DIAGNOSIS — E876 Hypokalemia: Secondary | ICD-10-CM | POA: Diagnosis not present

## 2022-06-05 DIAGNOSIS — B370 Candidal stomatitis: Secondary | ICD-10-CM | POA: Diagnosis present

## 2022-06-05 DIAGNOSIS — B961 Klebsiella pneumoniae [K. pneumoniae] as the cause of diseases classified elsewhere: Secondary | ICD-10-CM

## 2022-06-05 DIAGNOSIS — I5022 Chronic systolic (congestive) heart failure: Secondary | ICD-10-CM | POA: Diagnosis present

## 2022-06-05 DIAGNOSIS — M419 Scoliosis, unspecified: Secondary | ICD-10-CM | POA: Diagnosis not present

## 2022-06-05 DIAGNOSIS — K208 Other esophagitis without bleeding: Secondary | ICD-10-CM | POA: Diagnosis present

## 2022-06-05 DIAGNOSIS — Z515 Encounter for palliative care: Secondary | ICD-10-CM | POA: Diagnosis not present

## 2022-06-05 DIAGNOSIS — Z833 Family history of diabetes mellitus: Secondary | ICD-10-CM

## 2022-06-05 DIAGNOSIS — I4891 Unspecified atrial fibrillation: Secondary | ICD-10-CM | POA: Diagnosis not present

## 2022-06-05 DIAGNOSIS — R197 Diarrhea, unspecified: Secondary | ICD-10-CM | POA: Diagnosis present

## 2022-06-05 DIAGNOSIS — K222 Esophageal obstruction: Secondary | ICD-10-CM | POA: Diagnosis not present

## 2022-06-05 DIAGNOSIS — I110 Hypertensive heart disease with heart failure: Secondary | ICD-10-CM | POA: Diagnosis present

## 2022-06-05 DIAGNOSIS — J150 Pneumonia due to Klebsiella pneumoniae: Secondary | ICD-10-CM

## 2022-06-05 DIAGNOSIS — E43 Unspecified severe protein-calorie malnutrition: Secondary | ICD-10-CM | POA: Diagnosis present

## 2022-06-05 DIAGNOSIS — D61818 Other pancytopenia: Secondary | ICD-10-CM | POA: Diagnosis not present

## 2022-06-05 DIAGNOSIS — K2289 Other specified disease of esophagus: Secondary | ICD-10-CM | POA: Diagnosis not present

## 2022-06-05 DIAGNOSIS — T66XXXA Radiation sickness, unspecified, initial encounter: Secondary | ICD-10-CM | POA: Diagnosis not present

## 2022-06-05 DIAGNOSIS — I639 Cerebral infarction, unspecified: Secondary | ICD-10-CM | POA: Diagnosis not present

## 2022-06-05 DIAGNOSIS — D61810 Antineoplastic chemotherapy induced pancytopenia: Secondary | ICD-10-CM | POA: Diagnosis present

## 2022-06-05 DIAGNOSIS — J189 Pneumonia, unspecified organism: Secondary | ICD-10-CM | POA: Diagnosis not present

## 2022-06-05 DIAGNOSIS — R1319 Other dysphagia: Secondary | ICD-10-CM | POA: Diagnosis not present

## 2022-06-05 DIAGNOSIS — T451X5A Adverse effect of antineoplastic and immunosuppressive drugs, initial encounter: Secondary | ICD-10-CM | POA: Diagnosis present

## 2022-06-05 DIAGNOSIS — I10 Essential (primary) hypertension: Secondary | ICD-10-CM | POA: Diagnosis not present

## 2022-06-05 DIAGNOSIS — R000 Tachycardia, unspecified: Secondary | ICD-10-CM | POA: Diagnosis not present

## 2022-06-05 DIAGNOSIS — C49A5 Gastrointestinal stromal tumor of rectum: Secondary | ICD-10-CM | POA: Diagnosis not present

## 2022-06-05 DIAGNOSIS — Y842 Radiological procedure and radiotherapy as the cause of abnormal reaction of the patient, or of later complication, without mention of misadventure at the time of the procedure: Secondary | ICD-10-CM | POA: Diagnosis present

## 2022-06-05 DIAGNOSIS — J9601 Acute respiratory failure with hypoxia: Secondary | ICD-10-CM | POA: Diagnosis present

## 2022-06-05 DIAGNOSIS — Z66 Do not resuscitate: Secondary | ICD-10-CM | POA: Diagnosis present

## 2022-06-05 DIAGNOSIS — I6389 Other cerebral infarction: Secondary | ICD-10-CM | POA: Diagnosis not present

## 2022-06-05 DIAGNOSIS — R410 Disorientation, unspecified: Secondary | ICD-10-CM | POA: Diagnosis not present

## 2022-06-05 DIAGNOSIS — R7881 Bacteremia: Secondary | ICD-10-CM

## 2022-06-05 DIAGNOSIS — Z7189 Other specified counseling: Secondary | ICD-10-CM | POA: Diagnosis not present

## 2022-06-05 DIAGNOSIS — R1310 Dysphagia, unspecified: Secondary | ICD-10-CM | POA: Diagnosis not present

## 2022-06-05 DIAGNOSIS — R64 Cachexia: Secondary | ICD-10-CM | POA: Diagnosis present

## 2022-06-05 DIAGNOSIS — R627 Adult failure to thrive: Secondary | ICD-10-CM | POA: Diagnosis present

## 2022-06-05 DIAGNOSIS — E871 Hypo-osmolality and hyponatremia: Secondary | ICD-10-CM | POA: Diagnosis present

## 2022-06-05 DIAGNOSIS — I429 Cardiomyopathy, unspecified: Secondary | ICD-10-CM | POA: Diagnosis present

## 2022-06-05 DIAGNOSIS — G9341 Metabolic encephalopathy: Secondary | ICD-10-CM | POA: Diagnosis present

## 2022-06-05 DIAGNOSIS — R5081 Fever presenting with conditions classified elsewhere: Secondary | ICD-10-CM | POA: Diagnosis present

## 2022-06-05 DIAGNOSIS — K449 Diaphragmatic hernia without obstruction or gangrene: Secondary | ICD-10-CM | POA: Diagnosis not present

## 2022-06-05 DIAGNOSIS — I2510 Atherosclerotic heart disease of native coronary artery without angina pectoris: Secondary | ICD-10-CM | POA: Diagnosis not present

## 2022-06-05 DIAGNOSIS — J449 Chronic obstructive pulmonary disease, unspecified: Secondary | ICD-10-CM | POA: Diagnosis not present

## 2022-06-05 DIAGNOSIS — C771 Secondary and unspecified malignant neoplasm of intrathoracic lymph nodes: Secondary | ICD-10-CM | POA: Diagnosis not present

## 2022-06-05 DIAGNOSIS — Z8551 Personal history of malignant neoplasm of bladder: Secondary | ICD-10-CM

## 2022-06-05 DIAGNOSIS — Z4659 Encounter for fitting and adjustment of other gastrointestinal appliance and device: Secondary | ICD-10-CM | POA: Diagnosis not present

## 2022-06-05 DIAGNOSIS — Z902 Acquired absence of lung [part of]: Secondary | ICD-10-CM

## 2022-06-05 DIAGNOSIS — I491 Atrial premature depolarization: Secondary | ICD-10-CM | POA: Diagnosis not present

## 2022-06-05 DIAGNOSIS — D509 Iron deficiency anemia, unspecified: Secondary | ICD-10-CM | POA: Diagnosis not present

## 2022-06-05 DIAGNOSIS — R6520 Severe sepsis without septic shock: Secondary | ICD-10-CM | POA: Diagnosis present

## 2022-06-05 DIAGNOSIS — E785 Hyperlipidemia, unspecified: Secondary | ICD-10-CM | POA: Diagnosis not present

## 2022-06-05 DIAGNOSIS — D696 Thrombocytopenia, unspecified: Secondary | ICD-10-CM | POA: Diagnosis present

## 2022-06-05 DIAGNOSIS — Z510 Encounter for antineoplastic radiation therapy: Secondary | ICD-10-CM | POA: Diagnosis not present

## 2022-06-05 DIAGNOSIS — Z79899 Other long term (current) drug therapy: Secondary | ICD-10-CM

## 2022-06-05 DIAGNOSIS — D702 Other drug-induced agranulocytosis: Secondary | ICD-10-CM

## 2022-06-05 DIAGNOSIS — I6349 Cerebral infarction due to embolism of other cerebral artery: Secondary | ICD-10-CM | POA: Diagnosis not present

## 2022-06-05 DIAGNOSIS — Z87891 Personal history of nicotine dependence: Secondary | ICD-10-CM | POA: Diagnosis not present

## 2022-06-05 DIAGNOSIS — Z7902 Long term (current) use of antithrombotics/antiplatelets: Secondary | ICD-10-CM

## 2022-06-05 DIAGNOSIS — A0472 Enterocolitis due to Clostridium difficile, not specified as recurrent: Secondary | ICD-10-CM | POA: Diagnosis present

## 2022-06-05 DIAGNOSIS — J440 Chronic obstructive pulmonary disease with acute lower respiratory infection: Secondary | ICD-10-CM | POA: Diagnosis present

## 2022-06-05 DIAGNOSIS — N179 Acute kidney failure, unspecified: Secondary | ICD-10-CM | POA: Diagnosis present

## 2022-06-05 DIAGNOSIS — R5383 Other fatigue: Secondary | ICD-10-CM | POA: Diagnosis not present

## 2022-06-05 DIAGNOSIS — Z1152 Encounter for screening for COVID-19: Secondary | ICD-10-CM

## 2022-06-05 DIAGNOSIS — C3412 Malignant neoplasm of upper lobe, left bronchus or lung: Secondary | ICD-10-CM | POA: Diagnosis not present

## 2022-06-05 DIAGNOSIS — F17210 Nicotine dependence, cigarettes, uncomplicated: Secondary | ICD-10-CM | POA: Diagnosis not present

## 2022-06-05 DIAGNOSIS — C3490 Malignant neoplasm of unspecified part of unspecified bronchus or lung: Secondary | ICD-10-CM | POA: Diagnosis not present

## 2022-06-05 DIAGNOSIS — R29704 NIHSS score 4: Secondary | ICD-10-CM | POA: Diagnosis not present

## 2022-06-05 DIAGNOSIS — R230 Cyanosis: Secondary | ICD-10-CM | POA: Diagnosis not present

## 2022-06-05 DIAGNOSIS — E46 Unspecified protein-calorie malnutrition: Secondary | ICD-10-CM | POA: Diagnosis not present

## 2022-06-05 DIAGNOSIS — R1314 Dysphagia, pharyngoesophageal phase: Secondary | ICD-10-CM | POA: Diagnosis present

## 2022-06-05 DIAGNOSIS — A09 Infectious gastroenteritis and colitis, unspecified: Secondary | ICD-10-CM

## 2022-06-05 DIAGNOSIS — Z681 Body mass index (BMI) 19 or less, adult: Secondary | ICD-10-CM

## 2022-06-05 DIAGNOSIS — Z4682 Encounter for fitting and adjustment of non-vascular catheter: Secondary | ICD-10-CM | POA: Diagnosis not present

## 2022-06-05 DIAGNOSIS — E872 Acidosis, unspecified: Secondary | ICD-10-CM | POA: Diagnosis present

## 2022-06-05 DIAGNOSIS — R6339 Other feeding difficulties: Secondary | ICD-10-CM | POA: Diagnosis not present

## 2022-06-05 DIAGNOSIS — R0602 Shortness of breath: Secondary | ICD-10-CM | POA: Diagnosis not present

## 2022-06-05 DIAGNOSIS — R5381 Other malaise: Secondary | ICD-10-CM | POA: Diagnosis present

## 2022-06-05 DIAGNOSIS — D630 Anemia in neoplastic disease: Secondary | ICD-10-CM | POA: Diagnosis present

## 2022-06-05 DIAGNOSIS — E559 Vitamin D deficiency, unspecified: Secondary | ICD-10-CM | POA: Diagnosis present

## 2022-06-05 DIAGNOSIS — D701 Agranulocytosis secondary to cancer chemotherapy: Secondary | ICD-10-CM | POA: Diagnosis present

## 2022-06-05 DIAGNOSIS — Z8249 Family history of ischemic heart disease and other diseases of the circulatory system: Secondary | ICD-10-CM

## 2022-06-05 DIAGNOSIS — C3492 Malignant neoplasm of unspecified part of left bronchus or lung: Secondary | ICD-10-CM | POA: Diagnosis present

## 2022-06-05 DIAGNOSIS — K6389 Other specified diseases of intestine: Secondary | ICD-10-CM | POA: Diagnosis not present

## 2022-06-05 DIAGNOSIS — R0902 Hypoxemia: Secondary | ICD-10-CM | POA: Diagnosis not present

## 2022-06-05 DIAGNOSIS — K219 Gastro-esophageal reflux disease without esophagitis: Secondary | ICD-10-CM | POA: Diagnosis not present

## 2022-06-05 DIAGNOSIS — R0689 Other abnormalities of breathing: Secondary | ICD-10-CM | POA: Diagnosis not present

## 2022-06-05 DIAGNOSIS — N200 Calculus of kidney: Secondary | ICD-10-CM | POA: Diagnosis not present

## 2022-06-05 DIAGNOSIS — R531 Weakness: Secondary | ICD-10-CM | POA: Diagnosis not present

## 2022-06-05 DIAGNOSIS — Z818 Family history of other mental and behavioral disorders: Secondary | ICD-10-CM

## 2022-06-05 DIAGNOSIS — R231 Pallor: Secondary | ICD-10-CM | POA: Diagnosis not present

## 2022-06-05 DIAGNOSIS — Z923 Personal history of irradiation: Secondary | ICD-10-CM

## 2022-06-05 LAB — COMPREHENSIVE METABOLIC PANEL
ALT: 70 U/L — ABNORMAL HIGH (ref 0–44)
AST: 150 U/L — ABNORMAL HIGH (ref 15–41)
Albumin: 2.4 g/dL — ABNORMAL LOW (ref 3.5–5.0)
Alkaline Phosphatase: 90 U/L (ref 38–126)
Anion gap: 9 (ref 5–15)
BUN: 53 mg/dL — ABNORMAL HIGH (ref 8–23)
CO2: 25 mmol/L (ref 22–32)
Calcium: 8.1 mg/dL — ABNORMAL LOW (ref 8.9–10.3)
Chloride: 96 mmol/L — ABNORMAL LOW (ref 98–111)
Creatinine, Ser: 1.85 mg/dL — ABNORMAL HIGH (ref 0.61–1.24)
GFR, Estimated: 38 mL/min — ABNORMAL LOW (ref >60)
Glucose, Bld: 132 mg/dL — ABNORMAL HIGH (ref 70–99)
Potassium: 2.6 mmol/L — CL (ref 3.5–5.1)
Sodium: 130 mmol/L — ABNORMAL LOW (ref 135–145)
Total Bilirubin: 1.1 mg/dL (ref 0.3–1.2)
Total Protein: 6 g/dL — ABNORMAL LOW (ref 6.5–8.1)

## 2022-06-05 LAB — CBC WITH DIFFERENTIAL/PLATELET
Abs Immature Granulocytes: 0.02 10*3/uL (ref 0.00–0.07)
Basophils Absolute: 0 10*3/uL (ref 0.0–0.1)
Basophils Relative: 1 %
Eosinophils Absolute: 0 10*3/uL (ref 0.0–0.5)
Eosinophils Relative: 0 %
HCT: 26.9 % — ABNORMAL LOW (ref 39.0–52.0)
Hemoglobin: 9.2 g/dL — ABNORMAL LOW (ref 13.0–17.0)
Immature Granulocytes: 2 %
Lymphocytes Relative: 5 %
Lymphs Abs: 0.1 10*3/uL — ABNORMAL LOW (ref 0.7–4.0)
MCH: 31.1 pg (ref 26.0–34.0)
MCHC: 34.2 g/dL (ref 30.0–36.0)
MCV: 90.9 fL (ref 80.0–100.0)
Monocytes Absolute: 0.1 10*3/uL (ref 0.1–1.0)
Monocytes Relative: 7 %
Neutro Abs: 0.9 10*3/uL — ABNORMAL LOW (ref 1.7–7.7)
Neutrophils Relative %: 85 %
Platelets: 59 10*3/uL — ABNORMAL LOW (ref 150–400)
RBC: 2.96 MIL/uL — ABNORMAL LOW (ref 4.22–5.81)
RDW: 14.5 % (ref 11.5–15.5)
WBC: 1.1 10*3/uL — CL (ref 4.0–10.5)
nRBC: 0 % (ref 0.0–0.2)

## 2022-06-05 LAB — RAD ONC ARIA SESSION SUMMARY
Course Elapsed Days: 21
Plan Fractions Treated to Date: 16
Plan Prescribed Dose Per Fraction: 2 Gy
Plan Total Fractions Prescribed: 30
Plan Total Prescribed Dose: 60 Gy
Reference Point Dosage Given to Date: 32 Gy
Reference Point Session Dosage Given: 2 Gy
Session Number: 16

## 2022-06-05 LAB — LACTIC ACID, PLASMA: Lactic Acid, Venous: 1.5 mmol/L (ref 0.5–1.9)

## 2022-06-05 LAB — MAGNESIUM: Magnesium: 2.2 mg/dL (ref 1.7–2.4)

## 2022-06-05 LAB — RESP PANEL BY RT-PCR (RSV, FLU A&B, COVID)  RVPGX2
Influenza A by PCR: NEGATIVE
Influenza B by PCR: NEGATIVE
Resp Syncytial Virus by PCR: NEGATIVE
SARS Coronavirus 2 by RT PCR: NEGATIVE

## 2022-06-05 MED ORDER — VANCOMYCIN HCL IN DEXTROSE 1-5 GM/200ML-% IV SOLN: 1000.0000 mg | INTRAVENOUS | Status: DC

## 2022-06-05 MED ORDER — SODIUM CHLORIDE 0.9 % IV SOLN
2.0000 g | Freq: Two times a day (BID) | INTRAVENOUS | Status: DC
  Administered 2022-06-06: 2 g via INTRAVENOUS
  Filled 2022-06-05: qty 12.5

## 2022-06-05 MED ORDER — SODIUM CHLORIDE 0.9 % IV SOLN
500.0000 mg | INTRAVENOUS | Status: DC
  Administered 2022-06-05: 500 mg via INTRAVENOUS
  Filled 2022-06-05: qty 5

## 2022-06-05 MED ORDER — ACETAMINOPHEN 160 MG/5ML PO SOLN: 650.0000 mg | Freq: Once | ORAL | Status: DC

## 2022-06-05 MED ORDER — SODIUM CHLORIDE 0.9 % IV SOLN
2.0000 g | INTRAVENOUS | Status: DC
  Administered 2022-06-05: 2 g via INTRAVENOUS
  Filled 2022-06-05: qty 20

## 2022-06-05 MED ORDER — LACTATED RINGERS IV SOLN: INTRAVENOUS | Status: DC

## 2022-06-05 MED ORDER — SODIUM CHLORIDE 0.9 % IV SOLN
2.0000 g | Freq: Once | INTRAVENOUS | Status: DC
  Filled 2022-06-05: qty 12.5

## 2022-06-05 MED ORDER — VANCOMYCIN HCL 1500 MG/300ML IV SOLN
1500.0000 mg | Freq: Once | INTRAVENOUS | Status: DC
  Filled 2022-06-05: qty 300

## 2022-06-05 MED ORDER — LACTATED RINGERS IV BOLUS (SEPSIS)
1000.0000 mL | Freq: Once | INTRAVENOUS | Status: AC
  Administered 2022-06-05: 1000 mL via INTRAVENOUS

## 2022-06-05 MED ORDER — ACETAMINOPHEN 10 MG/ML IV SOLN
1000.0000 mg | Freq: Once | INTRAVENOUS | Status: AC
  Administered 2022-06-05: 1000 mg via INTRAVENOUS
  Filled 2022-06-05: qty 100

## 2022-06-05 MED ORDER — NOREPINEPHRINE 4 MG/250ML-% IV SOLN
0.0000 ug/min | INTRAVENOUS | Status: DC
  Filled 2022-06-05: qty 250

## 2022-06-05 MED ORDER — ACETAMINOPHEN 500 MG PO TABS
1000.0000 mg | ORAL_TABLET | Freq: Once | ORAL | Status: DC
  Filled 2022-06-05: qty 2

## 2022-06-05 MED ORDER — VANCOMYCIN HCL IN DEXTROSE 1-5 GM/200ML-% IV SOLN
1000.0000 mg | Freq: Once | INTRAVENOUS | Status: AC
  Administered 2022-06-06: 1000 mg via INTRAVENOUS
  Filled 2022-06-05: qty 200

## 2022-06-05 MED ORDER — VANCOMYCIN HCL IN DEXTROSE 1-5 GM/200ML-% IV SOLN
1000.0000 mg | Freq: Once | INTRAVENOUS | Status: DC
  Filled 2022-06-05: qty 200

## 2022-06-05 MED ORDER — METRONIDAZOLE 500 MG/100ML IV SOLN
500.0000 mg | Freq: Once | INTRAVENOUS | Status: DC
  Filled 2022-06-05: qty 100

## 2022-06-05 MED ORDER — POTASSIUM CHLORIDE 10 MEQ/100ML IV SOLN
10.0000 meq | INTRAVENOUS | Status: AC
  Administered 2022-06-05 – 2022-06-06 (×6): 10 meq via INTRAVENOUS
  Filled 2022-06-05 (×6): qty 100

## 2022-06-05 NOTE — ED Provider Notes (Signed)
[facility] Provider Note   CSN: [id number] Arrival date & time: 06/05/22  1756     History  Chief Complaint  Patient presents with   Weakness    HARLES KAUK is a 72 y.o. male, hx of lung cancer, who presents to the ED 2/2 to confusion for the last few days.  Patient is confused now send majority of history is from the wife.  She states that for the last few days he just been more confused, and very weak.  He has been not acting why right since Saturday night.  She denies any nausea, vomiting, fevers, diarrhea, abdominal pain, shortness of breath when asked at home.  No recent falls.  She states that he has just been feeling so poorly, that she even went to his chemo appointment with him, because he was so disoriented.  She states that they hold chemo, and did labs yesterday which showed that he was hypokalemic, he received IV potassium, and was feeling better, but when he got home he started going downhill again.       Home Medications Prior to Admission medications   Medication Sig Start Date End Date Taking? Authorizing Provider  acetaminophen (TYLENOL) 500 MG tablet Take 1,000 mg by mouth every 8 (eight) hours as needed for moderate pain.    [provider]  amLODipine (NORVASC) 5 MG tablet Take 1 tablet (5 mg total) by mouth daily. 10/25/21   [name], MD  atorvastatin (LIPITOR) 40 MG tablet Take 1/2 tablet (20 mg total) by mouth daily. 10/25/21   [name], MD  Cholecalciferol (VITAMIN D3 PO) Take 1 capsule by mouth daily.    [provider]  cholestyramine Lucrezia Starch) 4 GM/DOSE powder Take 4 g by mouth 2 (two) times daily.    [provider]  diphenoxylate-atropine (LOMOTIL) 2.5-0.025 MG tablet Take 2 tablets by mouth 4 (four) times daily as needed for diarrhea or loose stools.    [provider]  ferrous sulfate 325 (65 FE) MG EC tablet Take 1 tablet (325 mg total) by mouth 2 (two)  times daily. 03/26/22 03/26/23  Michael Boston, MD  imatinib (GLEEVEC) 400 MG tablet Take 1 tablet (400 mg total) by mouth daily. Take with meals and large glass of water.Caution:Chemotherapy. Patient taking differently: Take 400 mg by mouth at bedtime. Take with meals and large glass of water.Caution:Chemotherapy. 03/19/22   Curt Bears, MD  loperamide (IMODIUM A-D) 2 MG tablet Take 4 mg by mouth 4 (four) times daily as needed for diarrhea or loose stools.    [provider]  megestrol (MEGACE ES) 625 MG/5ML suspension TAKE 5 MLS (625 MG TOTAL) BY MOUTH DAILY. 05/23/22   Curt Bears, MD  mirtazapine (REMERON) 15 MG tablet Take 1 tablet (15 mg total) by mouth at bedtime. 05/28/22   Heilingoetter, Cassandra L, PA-C  Multiple Vitamins-Minerals (PRESERVISION AREDS 2) CAPS Take 1 capsule by mouth 2 (two) times daily.    [provider]  omeprazole (PRILOSEC) 40 MG capsule Take 1 capsule (40 mg total) by mouth daily. 10/25/21   [name], MD  OVER THE COUNTER MEDICATION Take 3 tablets by mouth daily.  Balance of [employer], Historical, MD  OVER THE COUNTER MEDICATION Take 3 tablets by mouth daily. Balance of [employer], Historical, MD  potassium chloride SA (KLOR-CON M) 20 MEQ tablet Take 1 tablet (20 mEq total) by mouth daily.  06/04/22   Heilingoetter, Cassandra L, PA-C  prochlorperazine (COMPAZINE) 10 MG tablet Take 1 tablet (10 mg total) by mouth every 6 (six) hours as needed for nausea or vomiting. 04/30/22   Curt Bears, MD  sucralfate (CARAFATE) 1 g tablet Take 1 tablet by mouth 4 times daily -  with meals and at bedtime. 05/28/22   Heilingoetter, Cassandra L, PA-C      Allergies    Patient has no known allergies.    Review of Systems   Review of Systems  Constitutional:  Positive for fatigue.  Respiratory:  Negative for cough.   Neurological:  Positive for weakness.    Physical Exam Updated Vital Signs BP 96/62   Pulse (!)  101   Temp 99 F (37.2 C) (Rectal)   Resp 12   SpO2 97%  Physical Exam Vitals and nursing note reviewed.  Constitutional:      General: He is not in acute distress.    Appearance: He is well-developed. He is ill-appearing.  HENT:     Head: Normocephalic and atraumatic.  Eyes:     Conjunctiva/sclera: Conjunctivae normal.  Cardiovascular:     Rate and Rhythm: Normal rate and regular rhythm.     Heart sounds: No murmur heard. Pulmonary:     Effort: Pulmonary effort is normal. No respiratory distress.     Breath sounds: Normal breath sounds.     Comments: +dry cough Abdominal:     Palpations: Abdomen is soft.     Tenderness: There is no abdominal tenderness.  Musculoskeletal:        General: No swelling.     Cervical back: Neck supple.  Skin:    General: Skin is warm and dry.     Capillary Refill: Capillary refill takes less than 2 seconds.  Neurological:     Mental Status: He is lethargic and confused.  Psychiatric:        Mood and Affect: Mood normal.     ED Results / Procedures / Treatments   Labs (all labs ordered are listed, but only abnormal results are displayed) Labs Reviewed  CBC WITH DIFFERENTIAL/PLATELET - Abnormal; Notable for the following components:      Result Value   WBC 1.1 (*)    RBC 2.96 (*)    Hemoglobin 9.2 (*)    HCT 26.9 (*)    Platelets 59 (*)    Neutro Abs 0.9 (*)    Lymphs Abs 0.1 (*)    All other components within normal limits  COMPREHENSIVE METABOLIC PANEL - Abnormal; Notable for the following components:   Sodium 130 (*)    Potassium 2.6 (*)    Chloride 96 (*)    Glucose, Bld 132 (*)    BUN 53 (*)    Creatinine, Ser 1.85 (*)    Calcium 8.1 (*)    Total Protein 6.0 (*)    Albumin 2.4 (*)    AST 150 (*)    ALT 70 (*)    GFR, Estimated 38 (*)    All other components within normal limits  RESP PANEL BY RT-PCR (RSV, FLU A&B, COVID)  RVPGX2  CULTURE, BLOOD (ROUTINE X 2)  CULTURE, BLOOD (ROUTINE X 2)  EXPECTORATED SPUTUM  ASSESSMENT W GRAM STAIN, RFLX TO RESP C  MRSA NEXT GEN BY PCR, NASAL  URINE CULTURE  LACTIC ACID, PLASMA  MAGNESIUM  LACTIC ACID, PLASMA  URINALYSIS, ROUTINE W REFLEX MICROSCOPIC  HIV ANTIBODY (ROUTINE TESTING W REFLEX)  PROCALCITONIN  PROTIME-INR  APTT  LEGIONELLA PNEUMOPHILA SEROGP 1 UR AG  STREP PNEUMONIAE URINARY ANTIGEN  PROCALCITONIN  CBC WITH DIFFERENTIAL/PLATELET  CK  CREATININE, URINE, RANDOM  OSMOLALITY  OSMOLALITY, URINE  PHOSPHORUS  HEPATIC FUNCTION PANEL  PREALBUMIN  SODIUM, URINE, RANDOM  TSH  AMMONIA  BLOOD GAS, VENOUS    EKG None  Radiology DG Chest Port 1 View  Result Date: 06/05/2022 CLINICAL DATA:  Per chart: Pt arrived via EMS, weakness and lethargic since saturday, poor PO intake. Lung cancer on the left side, no nausea, diarrhea is typical for him, had radiation today. EXAM: PORTABLE CHEST - 1 VIEW COMPARISON:  01/29/2020 FINDINGS: Suspect patchy right infrahilar airspace disease. Post partial left pneumonectomy. Heart size and mediastinal contours are within normal limits. No effusion. Right acromioclavicular spurring. IMPRESSION: Patchy right infrahilar airspace disease, new since previous Electronically Signed   By: Lucrezia Europe M.D.   On: 06/05/2022 18:37    Procedures Procedures  Medications Ordered in ED Medications  lactated ringers infusion ( Intravenous New Bag/Given 06/05/22 2115)  lactated ringers infusion ( Intravenous New Bag/Given 06/05/22 1954)  cefTRIAXone (ROCEPHIN) 2 g in sodium chloride 0.9 % 100 mL IVPB (0 g Intravenous Stopped 06/05/22 2148)  azithromycin (ZITHROMAX) 500 mg in sodium chloride 0.9 % 250 mL IVPB (0 mg Intravenous Stopped 06/05/22 2019)  potassium chloride 10 mEq in 100 mL IVPB (10 mEq Intravenous New Bag/Given 06/05/22 2242)  lactated ringers infusion ( Intravenous New Bag/Given 06/05/22 2156)  lactated ringers infusion (has no administration in time range)  lactated ringers bolus 1,000 mL (0 mLs Intravenous Stopped  06/05/22 2145)    And  lactated ringers bolus 1,000 mL (0 mLs Intravenous Stopped 06/05/22 2145)  acetaminophen (OFIRMEV) IV 1,000 mg (0 mg Intravenous Stopped 06/05/22 1953)    ED Course/ Medical Decision Making/ A&P   {                          Medical Decision Making Patient is a 72 y.o. male, here for confusion and weakness for last few days. He is tachycardic, ill appearing and febrile, we will obtain sepsis work-up. He does not have any abdominal tenderness, hx is also limited 2/2 to his confusion. We will start antibiotics and IVF. BP initially 107/72.   Amount and/or Complexity of Data Reviewed Labs: ordered.    Details: WBC 1.1 Radiology: ordered.    Details: CXR shows patchy right infiltrate  ECG/medicine tests: ordered. Discussion of management or test interpretation with external provider(s): Patient received fluids, tachycardia improved, blood pressure initially worsening, spoke with ICU, they recommend pressing fluids prior to starting pressures and if still hypotensive call back. Was able to have improvement of BP after pressing fluids--full 2L, and MAP 65+. Admitted to hospitalist Dr. Roel Cluck for further care. Will need IVF, close monitoring of BP, and further antibiotics. Additionally will require further IV K, have started IV potassium, Mg OK.   Risk OTC drugs. Prescription drug management. Decision regarding hospitalization.   CRITICAL CARE Performed by: Osvaldo Shipper   Total critical care time: 30 minutes  Critical care time was exclusive of separately billable procedures and treating other patients.  Critical care was necessary to treat or prevent imminent or life-threatening deterioration.  Critical care was time spent personally by me on the following activities: development of treatment plan with patient and/or surrogate as well as nursing, discussions with consultants, evaluation of patient's response to treatment, examination of patient, obtaining history  from patient or surrogate,  ordering and performing treatments and interventions, ordering and review of laboratory studies, ordering and review of radiographic studies, pulse oximetry and re-evaluation of patient's condition.  Final Clinical Impression(s) / ED Diagnoses Final diagnoses:  Pneumonia of right lung due to infectious organism, unspecified part of lung  Sepsis, due to unspecified organism, unspecified whether acute organ dysfunction present [facility])  Hypokalemia    Rx / DC Orders ED Discharge Orders     None         Muad Noga, Si Gaul, PA 06/05/22 2306    Cristie Hem, MD 06/11/22 [phone]

## 2022-06-05 NOTE — Telephone Encounter (Signed)
Called placed to wife to make aware appointments on 06/06/22. Lab at 1030 am and patient to receive IV fluids at 1130 am following radiation treatment. Wife voiced understanding.

## 2022-06-05 NOTE — ED Notes (Signed)
ED TO INPATIENT HANDOFF REPORT  ED Nurse Name and Phone #: Sylvan Cheese Name/Age/Gender Jesse [name] 72 y.o. male Room/Bed: WA12/WA12  Code Status   Code Status: Full Code  Home/SNF/Other Home Patient oriented to: self, place, and situation Is this baseline? No   Triage Complete: Triage complete  Chief Complaint Sepsis [facility]) [A41.9]  Triage Note Pt arrived via EMS, weakness and lethargic since saturday, poor PO intake. Lung cancer on the left side, no nausea, diarrhea is typical for him, had radiation today. 4L of oxygen initiated with EMS. Room air pt is 90% but looked "blue" per EMS, soft pressure lowest 86'V systolic, weakness with standing and walking, 30RR, 152 CBG, 500 normal saline and 4mg  zofran given with EMS, 20 in R AC.     Allergies No Known Allergies  Level of Care/Admitting Diagnosis ED Disposition     ED Disposition  Admit   Condition  --   Comment  Hospital Area: [place] [[id number]]  Level of Care: Progressive [102]  Admit to Progressive based on following criteria: MULTISYSTEM THREATS such as stable sepsis, metabolic/electrolyte imbalance with or without encephalopathy that is responding to early treatment.  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   COPD (chronic obstructive pulmonary disease) ([place])    mild    DIVERTICULOSIS, COLON 01/14/2007   Qualifier: Diagnosis of  By: Paulina Fusi RN, [name]    GASTRITIS, CHRONIC 06/01/2004   Qualifier: Diagnosis of  By:  Nolon Rod CMA ([place]), Robin     GERD 01/09/2007   Qualifier: Diagnosis of  By: Sherren Mocha, RN, Ellen     Heart murmur    as a child; no murmur heard 12/14/19   HYPERLIPIDEMIA 01/09/2007   Qualifier: Diagnosis of  By: Sherren Mocha RN, Dorian Pod     Hypertension    Lung mass    left upper nodule   Macular degeneration    right eye   NEOPLASM, MALIGNANT, BLADDER, HX OF 2002   Qualifier: Diagnosis of  By: Nolon Rod CMA (AAMA), Robin  / no chemo or radiation   OSTEOARTHRITIS 01/14/2007   Qualifier: Diagnosis of  By: Paulina Fusi, RN, [name] - knees   Rectal mass    Reflux esophagitis 06/01/2004   Qualifier: Diagnosis of  By: Nolon Rod CMA (AAMA), Robin     TOBACCO USER 02/16/2009   Qualifier: Diagnosis of  By: Burnice Logan  MD, Doretha Sou    VITAMIN D DEFICIENCY 06/03/2007   Qualifier: Diagnosis of  By: Burnice Logan  MD, Doretha Sou    Past Surgical History:  Procedure Laterality Date   BREAST BIOPSY Left 03/16/2022   [place] LT RADIOACTIVE SEED LOC 03/16/2022 [facility]AMMOGRAP[facility]   BRONCHIAL BIOPSY  10/13/2019   Procedure: BRONCHIAL BIOPSIES;  Surgeon: Garner Nash, DO;  Location: [facility];  Service: Pulmonary;;   BRONCHIAL BRUSHINGS  10/13/2019   Procedure: BRONCHIAL BRUSHINGS;  Surgeon: Garner Nash, DO;  Location: [place] [facility];  Service: Pulmonary;;   BRONCHIAL NEEDLE ASPIRATION BIOPSY  10/13/2019   Procedure:  BRONCHIAL NEEDLE ASPIRATION BIOPSIES;  Surgeon: Garner Nash, DO;  Location: [facility];  Service: Pulmonary;;   BRONCHIAL NEEDLE ASPIRATION BIOPSY  04/11/2022   Procedure: BRONCHIAL NEEDLE ASPIRATION BIOPSIES;  Surgeon: Garner Nash, DO;  Location: [facility];  Service: Cardiopulmonary;;   BRONCHIAL WASHINGS  10/13/2019   Procedure: BRONCHIAL WASHINGS;  Surgeon: Garner Nash, DO;  Location: [place] [facility];  Service: Pulmonary;;   COLONOSCOPY     multiple   DIVERTING ILEOSTOMY N/A 12/14/2021   Procedure: DIVERTING ILEOSTOMY;  Surgeon: Michael Boston, MD;  Location: [facility];  Service: General;   Laterality: N/A;   ENDOBRONCHIAL ULTRASOUND Bilateral 04/11/2022   Procedure: ENDOBRONCHIAL ULTRASOUND;  Surgeon: Garner Nash, DO;  Location[facility];  Service: Cardiopulmonary;  Laterality: Bilateral;   ILEOSTOMY CLOSURE N/A 03/22/2022   Procedure: OPEN TAKEDOWN OF LOOP ILEOSTOMY;  Surgeon: Michael Boston, MD;  Location: [facility];  Service: General;  Laterality: N/A;  GEN w/ERAS PATHWAY LOCAL   INTERCOSTAL NERVE BLOCK Left 12/16/2019   Procedure: INTERCOSTAL NERVE BLOCK;  Surgeon: Lajuana Matte, MD;  Location: [place];  Service: Thoracic;  Laterality: Left;   KNEE SURGERY  07/20/2008   bilat.   NASAL SEPTUM SURGERY  1995   NODE DISSECTION Left 12/16/2019   Procedure: NODE DISSECTION;  Surgeon: Lajuana Matte, MD;  Location: [place];  Service: Thoracic;  Laterality: Left;   RADIOACTIVE SEED GUIDED AXILLARY SENTINEL LYMPH NODE Left 03/20/2022   Procedure: RADIOACTIVE SEED GUIDED LEFT AXILLARY SENTINEL LYMPH NODE BIOPSY;  Surgeon: Erroll Luna, MD;  Location: [place];  Service: General;  Laterality: Left;   RECTAL EXAM UNDER ANESTHESIA N/A 03/22/2022   Procedure: ANORECTAL EXAMINATION UNDER ANESTHESIA;  Surgeon: Michael Boston, MD;  [facility];  Service: General;  Laterality: N/A;   TONSILLECTOMY     TRANSURETHRAL RESECTION OF BLADDER TUMOR  2002   UPPER GASTROINTESTINAL ENDOSCOPY     VIDEO BRONCHOSCOPY  04/11/2022   Procedure: VIDEO BRONCHOSCOPY;  Surgeon: Garner Nash, DO;  [facility];  Service: Cardiopulmonary;;   VIDEO BRONCHOSCOPY WITH ENDOBRONCHIAL NAVIGATION N/A 10/13/2019   Procedure: VIDEO BRONCHOSCOPY WITH ENDOBRONCHIAL NAVIGATION;  Surgeon: Garner Nash, DO;  Location: [place];  Service: Pulmonary;  Laterality: N/A;   WISDOM TOOTH EXTRACTION     XI ROBOTIC ASSISTED LOWER ANTERIOR RESECTION N/A 12/14/2021   Procedure: XI ROBOTIC ASSISTED LOWER ANTERIOR ULTRA LOW RECTOSIGMOID RESECTION, COLOANAL HAND SEWN ANASTOMOSIS, AND BILATERAL TAP BLOCK;   Surgeon: Michael Boston, MD;  [facility];  Service: General;  Laterality: N/A;     A IV Location/Drains/Wounds Patient Lines/Drains/Airways Status     Active Line/Drains/Airways     Name Placement date Placement time Site Days   Peripheral IV 06/05/22 20 G Anterior;Distal;Right;Upper Arm 06/05/22  1830  Arm  less than 1   Peripheral IV 06/05/22 20 G Anterior;Distal;Right Forearm 06/05/22  1930  Forearm  less than 1            Intake/Output Last 24 hours  Intake/Output Summary (Last 24 hours) at 06/05/2022 2317 Last data filed at 06/05/2022 2146 Gross per 24 hour  Intake 100 ml  Output --  Net 100 ml    Labs/Imaging Results for orders placed or performed during the hospital encounter of 06/05/22 (from the past 48 hour(s))  Resp panel by RT-PCR (RSV, Flu A&B, Covid) Anterior Nasal Swab     Status: None   Collection Time: 06/05/22  6:20 PM   Specimen: Anterior Nasal Swab  Result Value  Ref Range   SARS Coronavirus 2 by RT PCR NEGATIVE NEGATIVE    Comment: (NOTE) SARS-CoV-2 target nucleic acids are NOT DETECTED.  The SARS-CoV-2 RNA is generally detectable in upper respiratory specimens during the acute phase of infection. The lowest concentration of SARS-CoV-2 viral copies this assay can detect is 138 copies/mL. A negative result does not preclude SARS-Cov-2 infection and should not be used as the sole basis for treatment or other patient management decisions. A negative result may occur with  improper specimen collection/handling, submission of specimen other than nasopharyngeal swab, presence of viral mutation(s) within the areas targeted by this assay, and inadequate number of viral copies(<138 copies/mL). A negative result must be combined with clinical observations, patient history, and epidemiological information. The expected result is Negative.  Fact Sheet for Patients:  EntrepreneurPulse.com.au  Fact Sheet for Healthcare Providers:   IncredibleEmployment.be  This test is no t yet approved or cleared by the [place] FDA and  has been authorized for detection and/or diagnosis of SARS-CoV-2 by FDA under an Emergency Use Authorization (EUA). This EUA will remain  in effect (meaning this test can be used) for the duration of the COVID-19 declaration under Section 564(b)(1) of the Act, 21 U.S.C.section 360bbb-3(b)(1), unless the authorization is terminated  or revoked sooner.       Influenza A by PCR NEGATIVE NEGATIVE   Influenza B by PCR NEGATIVE NEGATIVE    Comment: (NOTE) The Xpert Xpress SARS-CoV-2/FLU/RSV plus assay is intended as an aid in the diagnosis of influenza from Nasopharyngeal swab specimens and should not be used as a sole basis for treatment. Nasal washings and aspirates are unacceptable for Xpert Xpress SARS-CoV-2/FLU/RSV testing.  Fact Sheet for Patients: EntrepreneurPulse.com.au  Fact Sheet for Healthcare Providers: IncredibleEmployment.be  This test is not yet approved or cleared by the [place] FDA and has been authorized for detection and/or diagnosis of SARS-CoV-2 by FDA under an Emergency Use Authorization (EUA). This EUA will remain in effect (meaning this test can be used) for the duration of the COVID-19 declaration under Section 564(b)(1) of the Act, 21 U.S.C. section 360bbb-3(b)(1), unless the authorization is terminated or revoked.     Resp Syncytial Virus by PCR NEGATIVE NEGATIVE    Comment: (NOTE) Fact Sheet for Patients: EntrepreneurPulse.com.au  Fact Sheet for Healthcare Providers: IncredibleEmployment.be  This test is not yet approved or cleared by the [place] FDA and has been authorized for detection and/or diagnosis of SARS-CoV-2 by FDA under an Emergency Use Authorization (EUA). This EUA will remain in effect (meaning this test can be used) for the duration of  the COVID-19 declaration under Section 564(b)(1) of the Act, 21 U.S.C. section 360bbb-3(b)(1), unless the authorization is terminated or revoked.  Performed at [facility], [place] [address] [place], [place] [address]   Lactic acid, plasma     Status: None   Collection Time: 06/05/22  6:46 PM  Result Value Ref Range   Lactic Acid, Venous 1.5 0.5 - 1.9 mmol/L    Comment: Performed at [facility], [place] [address], [place], [place] [address]  CBC with Differential     Status: Abnormal   Collection Time: 06/05/22  6:46 PM  Result Value Ref Range   WBC 1.1 (LL) 4.0 - 10.5 K/uL    Comment: REPEATED TO VERIFY WHITE COUNT CONFIRMED ON SMEAR THIS CRITICAL RESULT HAS VERIFIED AND BEEN CALLED TO RN A Afrah Burlison BY ALEXIS Vining ON 02 20 2024 AT 1911, AND HAS BEEN READ  BACK.     RBC 2.96 (L) 4.22 - 5.81 MIL/uL   Hemoglobin 9.2 (L) 13.0 - 17.0 g/dL   HCT 26.9 (L) 39.0 - 52.0 %   MCV 90.9 80.0 - 100.0 fL   MCH 31.1 26.0 - 34.0 pg   MCHC 34.2 30.0 - 36.0 g/dL   RDW 14.5 11.5 - 15.5 %   Platelets 59 (L) 150 - 400 K/uL    Comment: SPECIMEN CHECKED FOR CLOTS Immature Platelet Fraction may be clinically indicated, consider ordering this additional test [id number] REPEATED TO VERIFY PLATELET COUNT CONFIRMED BY SMEAR    nRBC 0.0 0.0 - 0.2 %   Neutrophils Relative % 85 %   Neutro Abs 0.9 (L) 1.7 - 7.7 K/uL   Lymphocytes Relative 5 %   Lymphs Abs 0.1 (L) 0.7 - 4.0 K/uL   Monocytes Relative 7 %   Monocytes Absolute 0.1 0.1 - 1.0 K/uL   Eosinophils Relative 0 %   Eosinophils Absolute 0.0 0.0 - 0.5 K/uL   Basophils Relative 1 %   Basophils Absolute 0.0 0.0 - 0.1 K/uL   Immature Granulocytes 2 %   Abs Immature Granulocytes 0.02 0.00 - 0.07 K/uL   Schistocytes PRESENT     Comment: RARE   Ovalocytes PRESENT     Comment: Performed at [facility], [place] [address] [place], [place] [address]  Comprehensive metabolic panel     Status:  Abnormal   Collection Time: 06/05/22  6:46 PM  Result Value Ref Range   Sodium 130 (L) 135 - 145 mmol/L   Potassium 2.6 (LL) 3.5 - 5.1 mmol/L    Comment: CRITICAL RESULT CALLED TO, READ BACK BY AND VERIFIED WITH J.FERRAINOLO, RN AT 2012 ON 02.20.24 BY N.THOMPSON    Chloride 96 (L) 98 - 111 mmol/L   CO2 25 22 - 32 mmol/L   Glucose, Bld 132 (H) 70 - 99 mg/dL    Comment: Glucose reference range applies only to samples taken after fasting for at least 8 hours.   BUN 53 (H) 8 - 23 mg/dL   Creatinine, Ser 1.85 (H) 0.61 - 1.24 mg/dL   Calcium 8.1 (L) 8.9 - 10.3 mg/dL   Total Protein 6.0 (L) 6.5 - 8.1 g/dL   Albumin 2.4 (L) 3.5 - 5.0 g/dL   AST 150 (H) 15 - 41 U/L   ALT 70 (H) 0 - 44 U/L   Alkaline Phosphatase 90 38 - 126 U/L   Total Bilirubin 1.1 0.3 - 1.2 mg/dL   GFR, Estimated 38 (L) >60 mL/min    Comment: (NOTE) Calculated using the CKD-EPI Creatinine Equation (2021)    Anion gap 9 5 - 15    Comment: Performed at [facility], [place] [address] [place], [place] [address]  Magnesium     Status: None   Collection Time: 06/05/22  6:46 PM  Result Value Ref Range   Magnesium 2.2 1.7 - 2.4 mg/dL    Comment: Performed at [facility], [place] [address] [place], [place] [address]   DG Chest Port 1 View  Result Date: 06/05/2022 CLINICAL DATA:  Per chart: Pt arrived via EMS, weakness and lethargic since saturday, poor PO intake. Lung cancer on the left side, no nausea, diarrhea is typical for him, had radiation today. EXAM: PORTABLE CHEST - 1 VIEW COMPARISON:  01/29/2020 FINDINGS: Suspect patchy right infrahilar airspace disease. Post partial left pneumonectomy. Heart size and mediastinal contours are within normal limits. No effusion. Right acromioclavicular spurring. IMPRESSION:  Patchy right infrahilar airspace disease, new since previous Electronically Signed   By: Lucrezia Europe M.D.   On: 06/05/2022 18:37    Pending Labs Unresulted Labs (From admission,  onward)     Start     Ordered   06/06/22 0500  Procalcitonin  Daily,   R      06/05/22 2255   06/06/22 0500  CBC with Differential  Tomorrow morning,   R        06/05/22 2255   06/06/22 0500  Prealbumin  Tomorrow morning,   R        06/05/22 2258   06/05/22 2258  CK  Add-on,   AD        06/05/22 2258   06/05/22 2258  Creatinine, urine, random  Once,   R        06/05/22 2258   06/05/22 2258  Osmolality  Add-on,   AD        06/05/22 2258   06/05/22 2258  Osmolality, urine  Once,   R        06/05/22 2258   06/05/22 2258  Phosphorus  Add-on,   AD        06/05/22 2258   06/05/22 2258  Hepatic function panel  Add-on,   AD       Question:  Release to patient  Answer:  Immediate   06/05/22 2258   06/05/22 2258  Sodium, urine, random  Once,   R        06/05/22 2258   06/05/22 2258  TSH  Add-on,   AD        06/05/22 2258   06/05/22 2258  Ammonia  Once,   R        06/05/22 2258   06/05/22 2258  Blood gas, venous  Once,   R        06/05/22 2258   06/05/22 2255  Procalcitonin - Baseline  Add-on,   AD        06/05/22 2255   06/05/22 2255  Urine Culture (for pregnant, neutropenic or urologic patients or patients with an indwelling urinary catheter)  (Urine Culture)  ONCE - URGENT,   URGENT       Question Answer Comment  Indication Sepsis   Patient immune status Immunocompromised      06/05/22 2255   06/05/22 2254  HIV Antibody (routine testing w rflx)  (HIV Antibody (Routine testing w reflex) panel)  Once,   R        06/05/22 2255   06/05/22 2254  Protime-INR  ONCE - STAT,   STAT        06/05/22 2255   06/05/22 2254  APTT  ONCE - STAT,   STAT        06/05/22 2255   06/05/22 2254  Expectorated Sputum Assessment w Gram Stain, Rflx to Resp Cult  Once,   R       Question:  Patient immune status  Answer:  Immunocompromised   06/05/22 2255   06/05/22 2254  Legionella Pneumophila Serogp 1 Ur Ag  Once,   R        06/05/22 2255   06/05/22 2254  Strep pneumoniae urinary antigen  Once,   R         06/05/22 2255   06/05/22 2254  MRSA Next Gen by PCR, Nasal  Once,   R       Question Answer Comment  Patient immune status Immunocompromised   Release to  patient Immediate      06/05/22 2255   06/05/22 1820  Blood culture (routine x 2)  BLOOD CULTURE X 2,   R (with STAT occurrences)     Question:  Patient immune status  Answer:  Immunocompromised   06/05/22 1819   06/05/22 1820  Lactic acid, plasma  Now then every 2 hours,   R (with STAT occurrences)      06/05/22 1819   06/05/22 1820  Urinalysis, Routine w reflex microscopic -Urine, Clean Catch  Once,   URGENT       Question:  Specimen Source  Answer:  Urine, Clean Catch   06/05/22 1819   Signed and Held  Comprehensive metabolic panel  Tomorrow morning,   R       Question:  Release to patient  Answer:  Immediate   Signed and Held   Signed and Held  CBC  Tomorrow morning,   R       Question:  Release to patient  Answer:  Immediate   Signed and Held   Signed and Held  Magnesium  Tomorrow morning,   R        Signed and Held   Signed and Held  Phosphorus  Tomorrow morning,   R        Signed and Held            Vitals/Pain Today's Vitals   06/05/22 2145 06/05/22 2200 06/05/22 2244 06/05/22 2300  BP: 91/60 96/62    Pulse: (!) 51 (!) 101    Resp: 15 12    Temp:   98 F (36.7 C) 99 F (37.2 C)  TempSrc:   Oral Rectal  SpO2: 97%     PainSc:        Isolation Precautions Airborne and Contact precautions  Medications Medications  lactated ringers infusion ( Intravenous New Bag/Given 06/05/22 2115)  lactated ringers infusion ( Intravenous New Bag/Given 06/05/22 1954)  cefTRIAXone (ROCEPHIN) 2 g in sodium chloride 0.9 % 100 mL IVPB (0 g Intravenous Stopped 06/05/22 2148)  azithromycin (ZITHROMAX) 500 mg in sodium chloride 0.9 % 250 mL IVPB (0 mg Intravenous Stopped 06/05/22 2019)  potassium chloride 10 mEq in 100 mL IVPB (10 mEq Intravenous New Bag/Given 06/05/22 2242)  lactated ringers infusion ( Intravenous New Bag/Given  06/05/22 2156)  lactated ringers infusion (has no administration in time range)  vancomycin (VANCOCIN) IVPB 1000 mg/200 mL premix (has no administration in time range)  lactated ringers bolus 1,000 mL (0 mLs Intravenous Stopped 06/05/22 2145)    And  lactated ringers bolus 1,000 mL (0 mLs Intravenous Stopped 06/05/22 2145)  acetaminophen (OFIRMEV) IV 1,000 mg (0 mg Intravenous Stopped 06/05/22 1953)    Mobility Pt stated he walks at home but unable to do so at the moment, due to weakness.     Focused Assessments Pulmonary Assessment Handoff:  Lung sounds:   O2 Device: Room Air      R Recommendations: See Admitting Provider Note  Report given to:   Additional Notes: Pt arrived via EMS for weakness, pt AAOx3, pt unable to ambulate. Pt has generalized weakness in all extremities. Pt has lung cancer and had radiation today.

## 2022-06-05 NOTE — Assessment & Plan Note (Signed)
Obtain gastric panel Check for C. difficile could be related to recent chemo if persistent may need further imaging but at this point patient not endorsing abdominal pain abdomen soft nonacute

## 2022-06-05 NOTE — Telephone Encounter (Signed)
Received a call from patients wife stating patient was unresponsive. Encouraged wife to call 911 and make EMS aware that patient is currently undergoing chemo and radiation. Wife verbalized understanding.

## 2022-06-05 NOTE — Assessment & Plan Note (Signed)
- -  Patient presenting with  productive cough, fever    hypoxia  , and infiltrate in on chest x-ray -Infiltrate on CXR and 2-3 characteristics (fever, leukocytosis, purulent sputum) are consistent with pneumonia. -This appears to be most likely community-acquired pneumonia.     will admit for treatment of CAP will start on appropriate antibiotic coverage. -Cefepime and vancomycin given dropping white blood cell count   Obtain:  sputum cultures,                  Obtain respiratory panel                    influenza serologies negative                  COVID PCR negative                     blood cultures and sputum cultures ordered                   strep pneumo UA antigen,                   check for Legionella antigen.                Provide oxygen as needed.

## 2022-06-05 NOTE — ED Notes (Signed)
Per Provider, "can we pressure bag the fluids then if still not improved then we'll start pressors"

## 2022-06-05 NOTE — Assessment & Plan Note (Signed)
In the setting of dehydration will obtain electrolytes and rehydrate follow sodium also in the setting of history of lung cancer and pneumonia check TSH

## 2022-06-05 NOTE — Assessment & Plan Note (Signed)
-  SIRS criteria met with low white blood cell count,       Component Value Date/Time   WBC 1.1 (LL) 06/05/2022 1846   LYMPHSABS 0.1 (L) 06/05/2022 1846     tachycardia   ,   fever   RR >20 Today's Vitals   06/05/22 2036 06/05/22 2145 06/05/22 2200 06/05/22 2244  BP: (!) 82/53 91/60 96/62    Pulse: (!) 106 (!) 51 (!) 101   Resp: 18 15 12    Temp:    98 F (36.7 C)  TempSrc:    Oral  SpO2: 97% 97%    PainSc:        Order a lactic acid level if needed AND/OR Initiate the sepsis protocol with the   Vitals:   06/05/22 2036 06/05/22 2145 06/05/22 2200 06/05/22 2244  BP: (!) 82/53 91/60 96/62    Pulse: (!) 106 (!) 51 (!) 101   Resp: 18 15 12    Temp:    98 F (36.7 C)  TempSrc:    Oral  SpO2: 97% 97%      -Most likely source being: pulmonary,       Patient meeting criteria for Severe sepsis with    evidence of end organ damage/organ dysfunction such as      acute metabolic encephalopathy  ELT<53 mmhg or MAP < 65 mmhg,   Acute hypoxia requiring new supplemental oxygen, SpO2: 97 %    Acute thrombocytopenia with platelet < 100,000      Component Value Date/Time   PLT 59 (L) 06/05/2022 1846   PLT 85 (L) 06/04/2022 1152      - Obtain serial lactic acid and procalcitonin level.  - Initiated IV antibiotics in ER: Antibiotics Given (last 72 hours)     Date/Time Action Medication Dose Rate   06/05/22 1858 New Bag/Given   cefTRIAXone (ROCEPHIN) 2 g in sodium chloride 0.9 % 100 mL IVPB 2 g 200 mL/hr   06/05/22 1940 New Bag/Given   azithromycin (ZITHROMAX) 500 mg in sodium chloride 0.9 % 250 mL IVPB 500 mg 250 mL/hr       Will continue  on cefepime and vancomycin   - await results of blood and urine culture  - Rehydrate aggressively  Intravenous fluids were administered,          30cc/kg fluid    10:58 PM

## 2022-06-05 NOTE — Subjective & Objective (Signed)
Patient was unresponsive today patient has been undergoing chemotherapy and radiation therapy Has been generally weak and lethargic poor p.o. intake has been treated actively for lung cancer last radiation therapy was today. On EMS arrival was satting 90% on room air but looked blue per EMS and was started on 4 L O2.  Blood pressure soft in 80s respirations 30s CBG was 152 patient was given 500 normal saline 4 mg of Zofran Code sepsis was initiated

## 2022-06-05 NOTE — Assessment & Plan Note (Signed)
Resume Lipitor 40 mg a day when able to tolerate

## 2022-06-05 NOTE — Progress Notes (Signed)
PHARMACY -  BRIEF ANTIBIOTIC NOTE   Pharmacy has received consult(s) for vancomycin and cefepime from an ED provider.  The patient's profile has been reviewed for ht/wt/allergies/indication/available labs.    One time order(s) placed for vancomycin 1500 mg + cefepime 2 g  Further antibiotics/pharmacy consults should be ordered by admitting physician if indicated.                       Thank you,  Tawnya Crook, PharmD, BCPS Clinical Pharmacist 06/05/2022 6:27 PM

## 2022-06-05 NOTE — Progress Notes (Signed)
Pharmacy Antibiotic Note  SHERWOOD CASTILLA is a 72 y.o. male admitted on 06/05/2022 with confusion for the last few days, pt has a hx of lung cancer.  Pharmacy has been consulted to dose vancomycin and cefepime for sepsis.  Plan: Vancomycin 1gm IV x 1 then 1gm q36h (AUC 492.8, Scr 1.85, TBW) Cefepime  2gm IV q12h Follow renal function, cultures and clinical course     Temp (24hrs), Avg:100.2 F (37.9 C), Min:98 F (36.7 C), Max:102.9 F (39.4 C)  Recent Labs  Lab 06/04/22 1152 06/05/22 1846  WBC 1.9* 1.1*  CREATININE 1.09 1.85*  LATICACIDVEN  --  1.5    Estimated Creatinine Clearance: 31.4 mL/min (A) (by C-G formula based on SCr of 1.85 mg/dL (H)).    No Known Allergies  Antimicrobials this admission: 2/20 CTX x1 2/20 azith >> 2/20 vanc >> 2/21 cefepime >>  Dose adjustments this admission:   Microbiology results: 2/20 BCx:  2/20 UCx:   2/20 Sputum:   2/20 MRSA PCR:   Thank you for allowing pharmacy to be a part of this patient's care.  Dolly Rias RPh 06/05/2022, 11:21 PM

## 2022-06-05 NOTE — Progress Notes (Signed)
Elink following code sepsis °

## 2022-06-05 NOTE — Assessment & Plan Note (Signed)
Replace recheck check magnesium and phosphate and replace all electrolytes as needed keep on telemetry

## 2022-06-05 NOTE — Assessment & Plan Note (Signed)
-   most likely multifactorial secondary to combination of  infection  dehydration secondary to decreased by mouth intake    - Will rehydrate   - treat underlining infection   - Hold contributing medications   - if no improvement may need further imaging to evaluate for CNS pathology pathology such as MRI of the brain   - neurological exam appears to be nonfocal but patient unable to cooperate fully   - VBG ordered   - no history of liver disease ammonia ordered

## 2022-06-05 NOTE — Assessment & Plan Note (Signed)
In the setting of dehydration Will rehydrate and follow renal function strict I's and O's obtain urine electrolytes

## 2022-06-05 NOTE — Assessment & Plan Note (Signed)
In the setting of recent chemotherapy.  At this point no indication for bleeding if drops below 20 we will transfuse Appreciate oncology consult

## 2022-06-05 NOTE — Assessment & Plan Note (Signed)
this patient has acute respiratory failure with Hypoxia and   as documented by the presence of following: O2 saturatio< 90% on RA   Likely due to:   Pneumonia,   Provide O2 therapy and titrate as needed  Continuous pulse ox   check Pulse ox with ambulation prior to discharge   may need  TC consult for home O2 set up    flutter valve ordered

## 2022-06-05 NOTE — ED Triage Notes (Signed)
Pt arrived via EMS, weakness and lethargic since saturday, poor PO intake. Lung cancer on the left side, no nausea, diarrhea is typical for him, had radiation today. 4L of oxygen initiated with EMS. Room air pt is 90% but looked "blue" per EMS, soft pressure lowest 56'E systolic, weakness with standing and walking, 30RR, 152 CBG, 500 normal saline and 4mg  zofran given with EMS, 20 in R AC.

## 2022-06-05 NOTE — H&P (Signed)
Jesse [name] WYO:[id number] DOB: [birth year] DOA: 06/05/2022      PCP: [name], MD   [facility]     Oncology * Dr. Curt Bears GI  Gatha Mayer, MD    Patient arrived to ER on 06/05/22 at 1756 Referred by Attending Cristie Hem, MD   Patient coming from:    home Lives  With family    Chief Complaint:   Chief Complaint  Patient presents with   Weakness    HPI: Jesse [name] is a 72 y.o. male with medical history significant of lung cancer currently undergoing chemotherapy and radiation therapy, hypertension hyperlipidemia history of bladder cancer GERD hypokalemia recurrent , Anemia, COPD, macular degeneration  Presented with   severe fatigue confused Patient was unresponsive today patient has been undergoing chemotherapy and radiation therapy Has been generally weak and lethargic poor p.o. intake has been treated actively for lung cancer last radiation therapy was today. On EMS arrival was satting 90% on room air but looked blue per EMS and was started on 4 L O2.  Blood pressure soft in 80s respirations 30s CBG was 152 patient was given 500 normal saline 4 mg of Zofran Code sepsis was initiated   Per family has not been acting right since Saturday although on no nausea vomiting or diarrhea overall decreased p.o. intake noted.  Has not been following They have to hold his last chemo because he was so disoriented yesterday oncology checked his labs and found that he was hypokalemic and they gave him IV potassium at first he was feeling better but by time he went home he was not feeling well again Denies any bleeding or hitting his head No black stools   Does not smoke or drink   Initial COVID TEST  NEGATIVE   Lab Results  Component Value Date   SARSCOV2NAA NEGATIVE 06/05/2022   [place] NEGATIVE 04/11/2022   [place] NEGATIVE 12/14/2019   [place] NEGATIVE 10/10/2019    Regarding pertinent Chronic problems:      Hyperlipidemia   statins  Lipitor (atorvastatin)  Lipid Panel     Component Value Date/Time   CHOL 138 10/25/2021 0851   TRIG 74.0 10/25/2021 0851   HDL 50.90 10/25/2021 0851   CHOLHDL 3 10/25/2021 0851   VLDL 14.8 10/25/2021 0851   LDLCALC 72 10/25/2021 0851   LDLDIRECT 102.0 10/22/2018 0838     HTN on Norvasc        COPD -  not  on baseline oxygen        Chronic anemia - baseline hg Hemoglobin & Hematocrit  Recent Labs    05/28/22 0951 06/04/22 1152 06/05/22 1846  HGB 10.5* 9.9* 9.2*     While in ER:   Chest x-ray worrisome for pneumonia of the right lobe patient found to have severe sepsis and also hypokalemia  White blood cell count found to be 1.1 ANC 0.9      CXR -  Patchy right infrahilar airspace disease, new since previous       Following Medications were ordered in ER: Medications  lactated ringers infusion ( Intravenous New Bag/Given 06/05/22 2115)  lactated ringers infusion ( Intravenous New Bag/Given 06/05/22 1954)  cefTRIAXone (ROCEPHIN) 2 g in sodium chloride 0.9 % 100 mL IVPB (0 g Intravenous Stopped 06/05/22 2148)  azithromycin (ZITHROMAX) 500 mg in sodium chloride 0.9 % 250 mL IVPB (0 mg Intravenous Stopped 06/05/22 2019)  potassium chloride 10 mEq in 100 mL IVPB (10 mEq  Intravenous New Bag/Given 06/05/22 2143)  lactated ringers infusion ( Intravenous New Bag/Given 06/05/22 2156)  lactated ringers bolus 1,000 mL (0 mLs Intravenous Stopped 06/05/22 2145)    And  lactated ringers bolus 1,000 mL (0 mLs Intravenous Stopped 06/05/22 2145)  acetaminophen (OFIRMEV) IV 1,000 mg (0 mg Intravenous Stopped 06/05/22 1953)    _______________________________________________________ ER Provider Called:  PCCM  They Recommend admit to medicine  once BP impoved     ED Triage Vitals  Enc Vitals Group     BP 06/05/22 1810 107/72     Pulse Rate 06/05/22 1810 (!) 131     Resp 06/05/22 1810 14     Temp 06/05/22 1810 (!) 100.6 F (38.1 C)     Temp Source 06/05/22  1810 Oral     SpO2 06/05/22 1810 98 %     Weight --      Height --      Head Circumference --      Peak Flow --      Pain Score 06/05/22 1812 0     Pain Loc --      Pain Edu? --      Excl. in [place]? --   TMAX(24)@     _________________________________________ Significant initial  Findings: Abnormal Labs Reviewed  CBC WITH DIFFERENTIAL/PLATELET - Abnormal; Notable for the following components:      Result Value   WBC 1.1 (*)    RBC 2.96 (*)    Hemoglobin 9.2 (*)    HCT 26.9 (*)    Platelets 59 (*)    Neutro Abs 0.9 (*)    Lymphs Abs 0.1 (*)    All other components within normal limits  COMPREHENSIVE METABOLIC PANEL - Abnormal; Notable for the following components:   Sodium 130 (*)    Potassium 2.6 (*)    Chloride 96 (*)    Glucose, Bld 132 (*)    BUN 53 (*)    Creatinine, Ser 1.85 (*)    Calcium 8.1 (*)    Total Protein 6.0 (*)    Albumin 2.4 (*)    AST 150 (*)    ALT 70 (*)    GFR, Estimated 38 (*)    All other components within normal limits     ______  ECG: Ordered Personally reviewed and interpreted by me showing: HR : 123 Rhythm:   Sinus tachycardia     no evidence of ischemic changes QTC 460   ____________________ This patient meets SIRS Criteria and may be septic.     The recent clinical data is shown below. Vitals:   06/05/22 1930 06/05/22 2036 06/05/22 2145 06/05/22 2200  BP:  (!) 82/53 91/60 96/62   Pulse: (!) 123 (!) 106 (!) 51 (!) 101  Resp: (!) 22 18 15 12   Temp:      TempSrc:      SpO2:  97% 97%      WBC     Component Value Date/Time   WBC 1.1 (LL) 06/05/2022 1846   LYMPHSABS 0.1 (L) 06/05/2022 1846   MONOABS 0.1 06/05/2022 1846   EOSABS 0.0 06/05/2022 1846   BASOSABS 0.0 06/05/2022 1846     Lactic Acid, Venous    Component Value Date/Time   LATICACIDVEN 1.5 06/05/2022 1846     Procalcitonin   Ordered Lactic Acid, Venous    Component Value Date/Time   LATICACIDVEN 1.5 06/05/2022 1846      UA  ordered    Results for  orders placed or performed during  the hospital encounter of 06/05/22  Resp panel by RT-PCR (RSV, Flu A&B, Covid) Anterior Nasal Swab     Status: None   Collection Time: 06/05/22  6:20 PM   Specimen: Anterior Nasal Swab  Result Value Ref Range Status   SARS Coronavirus 2 by RT PCR NEGATIVE NEGATIVE Final         Influenza A by PCR NEGATIVE NEGATIVE Final   Influenza B by PCR NEGATIVE NEGATIVE Final         Resp Syncytial Virus by PCR NEGATIVE NEGATIVE Final          _______________________________________________ Hospitalist was called for admission for   Pneumonia of right lung due to infectious organism,    Sepsis,  Hypokalemia     The following Work up has been ordered so far:  Orders Placed This Encounter  Procedures   Blood culture (routine x 2)   Resp panel by RT-PCR (RSV, Flu A&B, Covid) Anterior Nasal Swab   DG Chest Port 1 View   Lactic acid, plasma   CBC with Differential   Comprehensive metabolic panel   Urinalysis, Routine w reflex microscopic -Urine, Clean Catch   Magnesium   Diet NPO time specified   Cardiac monitoring   Document height and weight   Assess and Document Glasgow Coma Scale   Document vital signs within 1-hour of fluid bolus completion. Notify provider of abnormal vital signs despite fluid resuscitation.   DO NOT delay antibiotics if unable to obtain blood culture.   Refer to Sidebar Report: Sepsis Sidebar ED/IP   Notify provider for difficulties obtaining IV access.   Insert peripheral IV x 2   Initiate Carrier Fluid Protocol   Code Sepsis activation.  This occurs automatically when order is signed and prioritizes pharmacy, lab, and radiology services for STAT collections and interventions.  If [facility] downtime, call [facility] [phone]) to activate Code Sepsis.   pharmacy consult   Consult to intensivist   Consult to hospitalist   Airborne and Contact precautions   Pulse oximetry, continuous   ED EKG 12-Lead     OTHER Significant initial   Findings:  labs showing:    Recent Labs  Lab 06/04/22 1152 06/05/22 1846  NA 132* 130*  K 2.8* 2.6*  CO2 25 25  GLUCOSE 115* 132*  BUN 40* 53*  CREATININE 1.09 1.85*  CALCIUM 8.5* 8.1*  MG  --  2.2    Cr    Up from baseline see below Lab Results  Component Value Date   CREATININE 1.85 (H) 06/05/2022   CREATININE 1.09 06/04/2022   CREATININE 0.75 05/28/2022    Recent Labs  Lab 06/04/22 1152 06/05/22 1846  AST 83* 150*  ALT 46* 70*  ALKPHOS 102 90  BILITOT 0.8 1.1  PROT 6.0* 6.0*  ALBUMIN 3.2* 2.4*   Lab Results  Component Value Date   CALCIUM 8.1 (L) 06/05/2022        Plt: Lab Results  Component Value Date   PLT 59 (L) 06/05/2022     Venous  Blood Gas orderd  ABG    Component Value Date/Time   PHART 7.430 12/14/2019 1141   PCO2ART 35.0 12/14/2019 1141   PO2ART 106 12/14/2019 1141   HCO3 22.9 12/14/2019 1141   ACIDBASEDEF 0.9 12/14/2019 1141   O2SAT 97.9 12/14/2019 1141       Recent Labs  Lab 06/04/22 1152 06/05/22 1846  WBC 1.9* 1.1*  NEUTROABS 1.7 0.9*  HGB 9.9* 9.2*  HCT 28.0* 26.9*  MCV  88.3 90.9  PLT 85* 59*    HG/HCT   stable,       Component Value Date/Time   HGB 9.2 (L) 06/05/2022 1846   HGB 9.9 (L) 06/04/2022 1152   HCT 26.9 (L) 06/05/2022 1846   MCV 90.9 06/05/2022 1846     DM  labs:  HbA1C: Recent Labs    10/25/21 0851 12/05/21 0938  HGBA1C 5.4 5.0       CBG (last 3)  No results for input(s): "GLUCAP" in the last 72 hours.        Cultures: No results found for: "SDES", "SPECREQUEST", "CULT", "REPTSTATUS"   Radiological Exams on Admission: DG Chest Port 1 View  Result Date: 06/05/2022 CLINICAL DATA:  Per chart: Pt arrived via EMS, weakness and lethargic since saturday, poor PO intake. Lung cancer on the left side, no nausea, diarrhea is typical for him, had radiation today. EXAM: PORTABLE CHEST - 1 VIEW COMPARISON:  01/29/2020 FINDINGS: Suspect patchy right infrahilar airspace disease. Post partial left  pneumonectomy. Heart size and mediastinal contours are within normal limits. No effusion. Right acromioclavicular spurring. IMPRESSION: Patchy right infrahilar airspace disease, new since previous Electronically Signed   By: Lucrezia Europe M.D.   On: 06/05/2022 18:37   _______________________________________________________________________________________________________ Latest  Blood pressure 96/62, pulse (!) 101, temperature (!) 102.9 F (39.4 C), temperature source Rectal, resp. rate 12, SpO2 97 %.   Vitals  labs and radiology finding personally reviewed  Review of Systems:    Pertinent positives include:   chills, fatigue,  shortness of breath at rest Constitutional:  No weight loss, night sweats, Fevers, weight loss  HEENT:  No headaches, Difficulty swallowing,Tooth/dental problems,Sore throat,  No sneezing, itching, ear ache, nasal congestion, post nasal drip,  Cardio-vascular:  No chest pain, Orthopnea, PND, anasarca, dizziness, palpitations.no Bilateral lower extremity swelling  GI:  No heartburn, indigestion, abdominal pain, nausea, vomiting, diarrhea, change in bowel habits, loss of appetite, melena, blood in stool, hematemesis Resp:  no. No dyspnea on exertion, No excess mucus, no productive cough, No non-productive cough, No coughing up of blood.No change in color of mucus.No wheezing. Skin:  no rash or lesions. No jaundice GU:  no dysuria, change in color of urine, no urgency or frequency. No straining to urinate.  No flank pain.  Musculoskeletal:  No joint pain or no joint swelling. No decreased range of motion. No back pain.  Psych:  No change in mood or affect. No depression or anxiety. No memory loss.  Neuro: no localizing neurological complaints, no tingling, no weakness, no double vision, no gait abnormality, no slurred speech, no confusion  All systems reviewed and apart from [place] all are  negative _______________________________________________________________________________________________ Past Medical History:   Past Medical History:  Diagnosis Date   Anemia    Cancer ([place])    bladder cancer   COPD (chronic obstructive pulmonary disease) ([place])    mild    DIVERTICULOSIS, COLON 01/14/2007   Qualifier: Diagnosis of  By: Paulina Fusi RN, [name]    GASTRITIS, CHRONIC 06/01/2004   Qualifier: Diagnosis of  By: Nolon Rod CMA ([place]), Robin     GERD 01/09/2007   Qualifier: Diagnosis of  By: Sherren Mocha, RN, Ellen     Heart murmur    as a child; no murmur heard 12/14/19   HYPERLIPIDEMIA 01/09/2007   Qualifier: Diagnosis of  By: Scherrie Gerlach     Hypertension    Lung mass    left upper nodule   Macular degeneration  right eye   NEOPLASM, MALIGNANT, BLADDER, HX OF 2002   Qualifier: Diagnosis of  By: Nolon Rod CMA (AAMA), Robin  / no chemo or radiation   OSTEOARTHRITIS 01/14/2007   Qualifier: Diagnosis of  By: Paulina Fusi, RN, [name] - knees   Rectal mass    Reflux esophagitis 06/01/2004   Qualifier: Diagnosis of  By: Nolon Rod CMA (AAMA), Robin     TOBACCO USER 02/16/2009   Qualifier: Diagnosis of  By: Burnice Logan  MD, Doretha Sou    VITAMIN D DEFICIENCY 06/03/2007   Qualifier: Diagnosis of  By: Burnice Logan  MD, Doretha Sou       Past Surgical History:  Procedure Laterality Date   BREAST BIOPSY Left 03/16/2022   [place] LT RADIOACTIVE SEED LOC 03/16/2022 [facility]MOGR[facility]   BRONCHIAL BIOPSY  10/13/2019   Procedure: BRONCHIAL BIOPSIES;  Surgeon: Garner Nash, DO;  Location: [facility];  Service: Pulmonary;;   BRONCHIAL BRUSHINGS  10/13/2019   Procedure: BRONCHIAL BRUSHINGS;  Surgeon: Garner Nash, DO;  Location: [facility];  Service: Pulmonary;;   BRONCHIAL NEEDLE ASPIRATION BIOPSY  10/13/2019   Procedure: BRONCHIAL NEEDLE ASPIRATION BIOPSIES;  Surgeon: Garner Nash, DO;  Location: [facility];  Service: Pulmonary;;   BRONCHIAL NEEDLE ASPIRATION BIOPSY  04/11/2022    Procedure: BRONCHIAL NEEDLE ASPIRATION BIOPSIES;  Surgeon: Garner Nash, DO;  Location: [facility];  Service: Cardiopulmonary;;   BRONCHIAL WASHINGS  10/13/2019   Procedure: BRONCHIAL WASHINGS;  Surgeon: Garner Nash, DO;  Location: [facility];  Service: Pulmonary;;   COLONOSCOPY     multiple   DIVERTING ILEOSTOMY N/A 12/14/2021   Procedure: DIVERTING ILEOSTOMY;  Surgeon: Michael Boston, MD;  Location: [facility];  Service: General;  Laterality: N/A;   ENDOBRONCHIAL ULTRASOUND Bilateral 04/11/2022   Procedure: ENDOBRONCHIAL ULTRASOUND;  Surgeon: Garner Nash, DO;  Location: [facility];  Service: Cardiopulmonary;  Laterality: Bilateral;   ILEOSTOMY CLOSURE N/A 03/22/2022   Procedure: OPEN TAKEDOWN OF LOOP ILEOSTOMY;  Surgeon: Michael Boston, MD;  Location: [facility];  Service: General;  Laterality: N/A;  GEN w/ERAS PATHWAY LOCAL   INTERCOSTAL NERVE BLOCK Left 12/16/2019   Procedure: INTERCOSTAL NERVE BLOCK;  Surgeon: Lajuana Matte, MD;  Location: [facility];  Service: Thoracic;  Laterality: Left;   KNEE SURGERY  07/20/2008   bilat.   NASAL SEPTUM SURGERY  1995   NODE DISSECTION Left 12/16/2019   Procedure: NODE DISSECTION;  Surgeon: Lajuana Matte, MD;  Location: [facility];  Service: Thoracic;  Laterality: Left;   RADIOACTIVE SEED GUIDED AXILLARY SENTINEL LYMPH NODE Left 03/20/2022   Procedure: RADIOACTIVE SEED GUIDED LEFT AXILLARY SENTINEL LYMPH NODE BIOPSY;  Surgeon: Erroll Luna, MD;  Location: [facility];  Service: General;  Laterality: Left;   RECTAL EXAM UNDER ANESTHESIA N/A 03/22/2022   Procedure: ANORECTAL EXAMINATION UNDER ANESTHESIA;  Surgeon: Michael Boston, MD;  Location[facility];  Service: General;  Laterality: N/A;   TONSILLECTOMY     TRANSURETHRAL RESECTION OF BLADDER TUMOR  2002   UPPER GASTROINTESTINAL ENDOSCOPY     VIDEO BRONCHOSCOPY  04/11/2022   Procedure: VIDEO BRONCHOSCOPY;  Surgeon: Garner Nash, DO;  [facility];  Service: Cardiopulmonary;;   VIDEO  BRONCHOSCOPY WITH ENDOBRONCHIAL NAVIGATION N/A 10/13/2019   Procedure: VIDEO BRONCHOSCOPY WITH ENDOBRONCHIAL NAVIGATION;  Surgeon: Garner Nash, DO;  Location: [facility];  Service: Pulmonary;  Laterality: N/A;   WISDOM TOOTH EXTRACTION     XI ROBOTIC ASSISTED LOWER ANTERIOR RESECTION N/A 12/14/2021   Procedure: XI ROBOTIC ASSISTED LOWER ANTERIOR  ULTRA LOW RECTOSIGMOID RESECTION, COLOANAL HAND SEWN ANASTOMOSIS, AND BILATERAL TAP BLOCK;  Surgeon: Michael Boston, MD;  Location[facility];  Service: General;  Laterality: N/A;    Social History:  Ambulatory  cane,       reports that he quit smoking about 2 years ago. His smoking use included cigarettes. He has a 20.00 pack-year smoking history. He has never used smokeless tobacco. He reports current alcohol use of about 15.0 standard drinks of alcohol per week. He reports that he does not currently use drugs after having used the following drugs: Marijuana.   Family History:  Family History  Problem Relation Age of Onset   Dementia Mother    Diabetes Father    Mental illness Father    Peripheral vascular disease Brother    ______________________________________________________________________________________________ Allergies: No Known Allergies   Prior to Admission medications   Medication Sig Start Date End Date Taking? Authorizing Provider  acetaminophen (TYLENOL) 500 MG tablet Take 1,000 mg by mouth every 8 (eight) hours as needed for moderate pain.    [provider]  amLODipine (NORVASC) 5 MG tablet Take 1 tablet (5 mg total) by mouth daily. 10/25/21   [name], MD  atorvastatin (LIPITOR) 40 MG tablet Take 1/2 tablet (20 mg total) by mouth daily. 10/25/21   [name], MD  Cholecalciferol (VITAMIN D3 PO) Take 1 capsule by mouth daily.    [provider]  cholestyramine Lucrezia Starch) 4 GM/DOSE powder Take 4 g by mouth 2 (two) times daily.    [provider]  diphenoxylate-atropine (LOMOTIL)  2.5-0.025 MG tablet Take 2 tablets by mouth 4 (four) times daily as needed for diarrhea or loose stools.    [provider]  ferrous sulfate 325 (65 FE) MG EC tablet Take 1 tablet (325 mg total) by mouth 2 (two) times daily. 03/26/22 03/26/23  Michael Boston, MD  imatinib (GLEEVEC) 400 MG tablet Take 1 tablet (400 mg total) by mouth daily. Take with meals and large glass of water.Caution:Chemotherapy. Patient taking differently: Take 400 mg by mouth at bedtime. Take with meals and large glass of water.Caution:Chemotherapy. 03/19/22   Curt Bears, MD  loperamide (IMODIUM A-D) 2 MG tablet Take 4 mg by mouth 4 (four) times daily as needed for diarrhea or loose stools.    [provider]  megestrol (MEGACE ES) 625 MG/5ML suspension TAKE 5 MLS (625 MG TOTAL) BY MOUTH DAILY. 05/23/22   Curt Bears, MD  mirtazapine (REMERON) 15 MG tablet Take 1 tablet (15 mg total) by mouth at bedtime. 05/28/22   Heilingoetter, Cassandra L, PA-C  Multiple Vitamins-Minerals (PRESERVISION AREDS 2) CAPS Take 1 capsule by mouth 2 (two) times daily.    [provider]  omeprazole (PRILOSEC) 40 MG capsule Take 1 capsule (40 mg total) by mouth daily. 10/25/21   [name], MD  OVER THE COUNTER MEDICATION Take 3 tablets by mouth daily.  Balance of [employer], Historical, MD  OVER THE COUNTER MEDICATION Take 3 tablets by mouth daily. Balance of [employer], Historical, MD  potassium chloride SA (KLOR-CON M) 20 MEQ tablet Take 1 tablet (20 mEq total) by mouth daily. 06/04/22   Heilingoetter, Cassandra L, PA-C  prochlorperazine (COMPAZINE) 10 MG tablet Take 1 tablet (10 mg total) by mouth every 6 (six) hours as needed for nausea or vomiting. 04/30/22   Curt Bears, MD  sucralfate (CARAFATE) 1 g tablet Take 1 tablet by mouth 4 times daily -  with meals and at bedtime. 05/28/22   Heilingoetter, Cassandra L, PA-C     ___________________________________________________________________________________________________ Physical Exam:    06/05/2022   10:00 PM 06/05/2022    9:45 PM 06/05/2022    8:36 PM  Vitals with BMI  Systolic 96 91 82  Diastolic 62 60 53  Pulse 403 51 106     1. General:  in No  Acute distress    Chronically ill   -appearing 2. Psychological: Alert and   Oriented 3. Head/ENT:    Dry Mucous Membranes                          Head Non traumatic, neck supple                          Poor Dentition pale 4. SKIN: decreased Skin turgor,  Skin clean Dry and intact no rash 5. Heart: Regular rate and rhythm no  Murmur, no Rub or gallop 6. Lungs , no wheezes or crackles   7. Abdomen: Soft,  non-tender,  distended bowel sounds present 8. Lower extremities: no clubbing, cyanosis, no  edema 9. Neurologically  strength 5 out of 5 in all 4 extremities cranial nerves II through XII intact 10. MSK: Normal range of motion    Chart has been reviewed  ______________________________________________________________________________________________  Assessment/Plan 72 y.o. male with medical history significant of lung cancer currently undergoing chemotherapy and radiation therapy, hypertension hyperlipidemia history of bladder cancer GERD hypokalemia recurrent , Anemia, COPD, macular degeneration   Admitted for   Pneumonia of right lung due to infectious organism, unspecified part of lung    Sepsis, due to unspecified organism, unspecified whether acute organ dysfunction present ([place])    Hypokalemia new pancytopenia    Present on Admission:  Severe sepsis ([place])  Dyslipidemia  Essential hypertension  Adenocarcinoma of left lung, stage 2 (HCC)  Hypokalemia  AKI (acute kidney injury) ([place])  Hyponatremia  Acute metabolic encephalopathy  Acute respiratory failure with hypoxia ([place])  CAP (community acquired pneumonia)  Thrombocytopenia ([place])  Sepsis ([place])  Diarrhea     Severe  sepsis ([place])  -SIRS criteria met with low white blood cell count,       Component Value Date/Time   WBC 1.1 (LL) 06/05/2022 1846   LYMPHSABS 0.1 (L) 06/05/2022 1846     tachycardia   ,   fever   RR >20 Today's Vitals   06/05/22 2036 06/05/22 2145 06/05/22 2200 06/05/22 2244  BP: (!) 82/53 91/60 96/62    Pulse: (!) 106 (!) 51 (!) 101   Resp: 18 15 12    Temp:    98 F (36.7 C)  TempSrc:    Oral  SpO2: 97% 97%    PainSc:        Order a lactic acid level if needed AND/OR Initiate the sepsis protocol with the   Vitals:   06/05/22 2036 06/05/22 2145 06/05/22 2200 06/05/22 2244  BP: (!) 82/53 91/60 96/62    Pulse: (!) 106 (!) 51 (!) 101   Resp: 18 15 12    Temp:    98 F (36.7 C)  TempSrc:    Oral  SpO2: 97% 97%      -Most likely source being: pulmonary,       Patient meeting criteria for Severe sepsis with    evidence of end organ damage/organ dysfunction such as      acute metabolic encephalopathy  KVQ<25 mmhg or  MAP < 65 mmhg,   Acute hypoxia requiring new supplemental oxygen, SpO2: 97 %    Acute thrombocytopenia with platelet < 100,000      Component Value Date/Time   PLT 59 (L) 06/05/2022 1846   PLT 85 (L) 06/04/2022 1152      - Obtain serial lactic acid and procalcitonin level.  - Initiated IV antibiotics in ER: Antibiotics Given (last 72 hours)     Date/Time Action Medication Dose Rate   06/05/22 1858 New Bag/Given   cefTRIAXone (ROCEPHIN) 2 g in sodium chloride 0.9 % 100 mL IVPB 2 g 200 mL/hr   06/05/22 1940 New Bag/Given   azithromycin (ZITHROMAX) 500 mg in sodium chloride 0.9 % 250 mL IVPB 500 mg 250 mL/hr       Will continue  on cefepime and vancomycin   - await results of blood and urine culture  - Rehydrate aggressively  Intravenous fluids were administered,          30cc/kg fluid    10:58 PM   Dyslipidemia Resume Lipitor 40 mg a day when able to tolerate  Essential hypertension Allow permissive hypertension  Adenocarcinoma of left  lung, stage 2 (HCC) Notified Dr. Julien Nordmann that patient has been admitted for severe sepsis  Hypokalemia Replace recheck check magnesium and phosphate and replace all electrolytes as needed keep on telemetry  AKI (acute kidney injury) ([place]) In the setting of dehydration Will rehydrate and follow renal function strict I's and O's obtain urine electrolytes  Hyponatremia In the setting of dehydration will obtain electrolytes and rehydrate follow sodium also in the setting of history of lung cancer and pneumonia check TSH  Acute metabolic encephalopathy   - most likely multifactorial secondary to combination of  infection  dehydration secondary to decreased by mouth intake    - Will rehydrate   - treat underlining infection   - Hold contributing medications   - if no improvement may need further imaging to evaluate for CNS pathology pathology such as MRI of the brain   - neurological exam appears to be nonfocal but patient unable to cooperate fully   - VBG ordered   - no history of liver disease ammonia ordered   Acute respiratory failure with hypoxia ([place])  this patient has acute respiratory failure with Hypoxia and   as documented by the presence of following: O2 saturatio< 90% on RA   Likely due to:   Pneumonia,   Provide O2 therapy and titrate as needed  Continuous pulse ox   check Pulse ox with ambulation prior to discharge   may need  TC consult for home O2 set up    flutter valve ordered   CAP (community acquired pneumonia)  - -Patient presenting with  productive cough, fever    hypoxia  , and infiltrate in on chest x-ray -Infiltrate on CXR and 2-3 characteristics (fever, leukocytosis, purulent sputum) are consistent with pneumonia. -This appears to be most likely community-acquired pneumonia.     will admit for treatment of CAP will start on appropriate antibiotic coverage. -Cefepime and vancomycin given dropping white blood cell count   Obtain:  sputum cultures,                   Obtain respiratory panel                    influenza serologies negative                  COVID PCR  negative                     blood cultures and sputum cultures ordered                   strep pneumo UA antigen,                   check for Legionella antigen.                Provide oxygen as needed.    Thrombocytopenia ([place]) In the setting of recent chemotherapy.  At this point no indication for bleeding if drops below 20 we will transfuse Appreciate oncology consult  Diarrhea Obtain gastric panel Check for C. difficile could be related to recent chemo if persistent may need further imaging but at this point patient not endorsing abdominal pain abdomen soft nonacute  Decreased urine output will obtain bladder scan strict I's and O's if increased residual place Foley  Other plan as per orders.  DVT prophylaxis:  SCD     Code Status:    Code Status: Prior FULL CODE  as per patient   I had personally discussed CODE STATUS with patient     Family Communication:   Family not at  Bedside    Disposition Plan:    To home once workup is complete and patient is stable   Following barriers for discharge:                            Electrolytes corrected                               Anemia sable                                                                                Will need consultants to evaluate patient prior to discharge   Would benefit from PT/OT eval prior to DC  Ordered                   Consults called: mohamed mohamed sent email  Admission status:  ED Disposition     ED Disposition  Admit   Condition  --   [place]: [facility] [[id number]]  Level of Care: Stepdown [14]  Admit to SDU based on following criteria: Hemodynamic compromise or significant risk of instability:  Patient requiring short term acute titration and management of vasoactive drips, and invasive monitoring (i.e., CVP and Arterial line).  May admit  patient to [facility] or [facility] if equivalent level of care is available:: No  Covid Evaluation: Confirmed COVID Negative  Diagnosis: Sepsis [facility]) [[id number]]  Admitting Physician: Toy Baker [[id number]]  Attending Physician: Toy Baker [[id number]]  Certification:: I certify this patient will need inpatient services for at least 2 midnights  Estimated Length of Stay: 2            inpatient     I Expect 2 midnight stay secondary to severity of patient's current illness need for inpatient interventions justified by the following:  hemodynamic instability despite optimal treatment (tachycardia hypotension  tachypnea  hypoxia )  Severe lab/radiological/exam abnormalities including:    sepsis and extensive comorbidities including:  COPD/asthma  malignancy,   That are currently affecting medical management.   I expect  patient to be hospitalized for 2 midnights requiring inpatient medical care.  Patient is at high risk for adverse outcome (such as loss of life or disability) if not treated.  Indication for inpatient stay as follows:  Severe change from baseline regarding mental status  New or worsening hypoxia   Need for IV antibiotics, IV fluids,     Level of care  stepdown tele indefinitely please discontinue once patient no longer qualifies COVID-19 Labs    Lab Results  Component Value Date   [place] 06/05/2022     Precautions: admitted as   Covid Negative  Critical   Patient is critically ill due to  hemodynamic instability  respiratory failure  severe sepsis  They are at high risk for life/limb threatening clinical deterioration requiring frequent reassessment and modifications of care.  Services provided include examination of the patient, review of relevant ancillary tests, prescription of lifesaving therapies, review of medications and prophylactic therapy.  Total critical care time excluding separately billable procedures: 60  Minutes.    Toy Baker 06/05/2022, 11:56 PM    [facility]     after 2 AM please page floor coverage PA If 7AM-7PM, please contact the day team taking care of the patient using [email or web address]   Patient was evaluated in the context of the global COVID-19 pandemic, which necessitated consideration that the patient might be at risk for infection with the SARS-CoV-2 virus that causes COVID-19. Institutional protocols and algorithms that pertain to the evaluation of patients at risk for COVID-19 are in a state of rapid change based on information released by regulatory bodies including the CDC and federal and state organizations. These policies and algorithms were followed during the patient's care.    Separate

## 2022-06-05 NOTE — Assessment & Plan Note (Signed)
Allow permissive hypertension

## 2022-06-05 NOTE — Assessment & Plan Note (Signed)
Notified Dr. Julien Nordmann that patient has been admitted for severe sepsis

## 2022-06-06 ENCOUNTER — Ambulatory Visit: Payer: Medicare Other

## 2022-06-06 DIAGNOSIS — B961 Klebsiella pneumoniae [K. pneumoniae] as the cause of diseases classified elsewhere: Secondary | ICD-10-CM

## 2022-06-06 DIAGNOSIS — A419 Sepsis, unspecified organism: Secondary | ICD-10-CM | POA: Diagnosis not present

## 2022-06-06 DIAGNOSIS — R6520 Severe sepsis without septic shock: Secondary | ICD-10-CM | POA: Diagnosis not present

## 2022-06-06 DIAGNOSIS — C3492 Malignant neoplasm of unspecified part of left bronchus or lung: Secondary | ICD-10-CM

## 2022-06-06 DIAGNOSIS — J150 Pneumonia due to Klebsiella pneumoniae: Secondary | ICD-10-CM

## 2022-06-06 DIAGNOSIS — C3490 Malignant neoplasm of unspecified part of unspecified bronchus or lung: Secondary | ICD-10-CM

## 2022-06-06 DIAGNOSIS — R7881 Bacteremia: Secondary | ICD-10-CM

## 2022-06-06 DIAGNOSIS — E876 Hypokalemia: Secondary | ICD-10-CM | POA: Diagnosis not present

## 2022-06-06 DIAGNOSIS — J189 Pneumonia, unspecified organism: Secondary | ICD-10-CM | POA: Diagnosis not present

## 2022-06-06 LAB — MRSA NEXT GEN BY PCR, NASAL: MRSA by PCR Next Gen: NOT DETECTED

## 2022-06-06 LAB — CBC
HCT: 24 % — ABNORMAL LOW (ref 39.0–52.0)
Hemoglobin: 7.9 g/dL — ABNORMAL LOW (ref 13.0–17.0)
MCH: 31.6 pg (ref 26.0–34.0)
MCHC: 32.9 g/dL (ref 30.0–36.0)
MCV: 96 fL (ref 80.0–100.0)
Platelets: 38 10*3/uL — ABNORMAL LOW (ref 150–400)
RBC: 2.5 MIL/uL — ABNORMAL LOW (ref 4.22–5.81)
RDW: 15 % (ref 11.5–15.5)
WBC: 1 10*3/uL — CL (ref 4.0–10.5)
nRBC: 0 % (ref 0.0–0.2)

## 2022-06-06 LAB — IRON AND TIBC
Iron: 17 ug/dL — ABNORMAL LOW (ref 45–182)
Saturation Ratios: 10 % — ABNORMAL LOW (ref 17.9–39.5)
TIBC: 176 ug/dL — ABNORMAL LOW (ref 250–450)
UIBC: 159 ug/dL

## 2022-06-06 LAB — BLOOD CULTURE ID PANEL (REFLEXED) - BCID2

## 2022-06-06 LAB — COMPREHENSIVE METABOLIC PANEL
ALT: 52 U/L — ABNORMAL HIGH (ref 0–44)
AST: 107 U/L — ABNORMAL HIGH (ref 15–41)
Albumin: 2 g/dL — ABNORMAL LOW (ref 3.5–5.0)
Alkaline Phosphatase: 70 U/L (ref 38–126)
Anion gap: 10 (ref 5–15)
BUN: 50 mg/dL — ABNORMAL HIGH (ref 8–23)
CO2: 22 mmol/L (ref 22–32)
Calcium: 7.9 mg/dL — ABNORMAL LOW (ref 8.9–10.3)
Chloride: 99 mmol/L (ref 98–111)
Creatinine, Ser: 1.67 mg/dL — ABNORMAL HIGH (ref 0.61–1.24)
GFR, Estimated: 43 mL/min — ABNORMAL LOW (ref >60)
Glucose, Bld: 109 mg/dL — ABNORMAL HIGH (ref 70–99)
Potassium: 3 mmol/L — ABNORMAL LOW (ref 3.5–5.1)
Sodium: 131 mmol/L — ABNORMAL LOW (ref 135–145)
Total Bilirubin: 1 mg/dL (ref 0.3–1.2)
Total Protein: 4.5 g/dL — ABNORMAL LOW (ref 6.5–8.1)

## 2022-06-06 LAB — GASTROINTESTINAL PANEL BY PCR, STOOL (REPLACES STOOL CULTURE)

## 2022-06-06 LAB — BASIC METABOLIC PANEL
Anion gap: 12 (ref 5–15)
BUN: 52 mg/dL — ABNORMAL HIGH (ref 8–23)
CO2: 23 mmol/L (ref 22–32)
Calcium: 8.1 mg/dL — ABNORMAL LOW (ref 8.9–10.3)
Chloride: 96 mmol/L — ABNORMAL LOW (ref 98–111)
Creatinine, Ser: 1.79 mg/dL — ABNORMAL HIGH (ref 0.61–1.24)
GFR, Estimated: 40 mL/min — ABNORMAL LOW (ref >60)
Glucose, Bld: 119 mg/dL — ABNORMAL HIGH (ref 70–99)
Potassium: 3.6 mmol/L (ref 3.5–5.1)
Sodium: 131 mmol/L — ABNORMAL LOW (ref 135–145)

## 2022-06-06 LAB — CBC WITH DIFFERENTIAL/PLATELET
Abs Immature Granulocytes: 0 10*3/uL (ref 0.00–0.07)
Basophils Absolute: 0 10*3/uL (ref 0.0–0.1)
Basophils Relative: 0 %
Eosinophils Absolute: 0 10*3/uL (ref 0.0–0.5)
Eosinophils Relative: 0 %
HCT: 24.5 % — ABNORMAL LOW (ref 39.0–52.0)
Hemoglobin: 8.3 g/dL — ABNORMAL LOW (ref 13.0–17.0)
Immature Granulocytes: 0 %
Lymphocytes Relative: 8 %
Lymphs Abs: 0 10*3/uL — ABNORMAL LOW (ref 0.7–4.0)
MCH: 31.2 pg (ref 26.0–34.0)
MCHC: 33.9 g/dL (ref 30.0–36.0)
MCV: 92.1 fL (ref 80.0–100.0)
Monocytes Absolute: 0 10*3/uL — ABNORMAL LOW (ref 0.1–1.0)
Monocytes Relative: 3 %
Neutro Abs: 0.3 10*3/uL — CL (ref 1.7–7.7)
Neutrophils Relative %: 89 %
Platelets: 47 10*3/uL — ABNORMAL LOW (ref 150–400)
RBC: 2.66 MIL/uL — ABNORMAL LOW (ref 4.22–5.81)
RDW: 14.8 % (ref 11.5–15.5)
WBC: 0.4 10*3/uL — CL (ref 4.0–10.5)
nRBC: 0 % (ref 0.0–0.2)

## 2022-06-06 LAB — C DIFFICILE QUICK SCREEN W PCR REFLEX
C Diff antigen: POSITIVE — AB
C Diff toxin: NEGATIVE

## 2022-06-06 LAB — OCCULT BLOOD X 1 CARD TO LAB, STOOL: Fecal Occult Bld: POSITIVE — AB

## 2022-06-06 LAB — TYPE AND SCREEN
ABO/RH(D): O POS
Antibody Screen: NEGATIVE

## 2022-06-06 LAB — HEPATIC FUNCTION PANEL
ALT: 57 U/L — ABNORMAL HIGH (ref 0–44)
AST: 119 U/L — ABNORMAL HIGH (ref 15–41)
Albumin: 2 g/dL — ABNORMAL LOW (ref 3.5–5.0)
Alkaline Phosphatase: 78 U/L (ref 38–126)
Bilirubin, Direct: 0.6 mg/dL — ABNORMAL HIGH (ref 0.0–0.2)
Indirect Bilirubin: 0.4 mg/dL (ref 0.3–0.9)
Total Bilirubin: 1 mg/dL (ref 0.3–1.2)
Total Protein: 4.7 g/dL — ABNORMAL LOW (ref 6.5–8.1)

## 2022-06-06 LAB — BLOOD GAS, VENOUS
Acid-Base Excess: 0.8 mmol/L (ref 0.0–2.0)
Bicarbonate: 26 mmol/L (ref 20.0–28.0)
O2 Saturation: 18.4 %
Patient temperature: 37
pCO2, Ven: 43 mmHg — ABNORMAL LOW (ref 44–60)
pH, Ven: 7.39 (ref 7.25–7.43)
pO2, Ven: <31 mmHg — CL (ref 32–45)

## 2022-06-06 LAB — GLUCOSE, CAPILLARY
Glucose-Capillary: 88 mg/dL (ref 70–99)
Glucose-Capillary: 81 mg/dL (ref 70–99)
Glucose-Capillary: 90 mg/dL (ref 70–99)
Glucose-Capillary: 73 mg/dL (ref 70–99)
Glucose-Capillary: 84 mg/dL (ref 70–99)

## 2022-06-06 LAB — HIV ANTIBODY (ROUTINE TESTING W REFLEX): HIV Screen 4th Generation wRfx: NONREACTIVE

## 2022-06-06 LAB — URINALYSIS, ROUTINE W REFLEX MICROSCOPIC
Bilirubin Urine: NEGATIVE
Glucose, UA: NEGATIVE mg/dL
Glucose, UA: NEGATIVE mg/dL
Ketones, ur: NEGATIVE mg/dL
Ketones, ur: 5 mg/dL — AB
Leukocytes,Ua: NEGATIVE
Leukocytes,Ua: NEGATIVE
Nitrite: NEGATIVE
Nitrite: NEGATIVE
Protein, ur: 100 mg/dL — AB
Protein, ur: 100 mg/dL — AB
RBC / HPF: >50 RBC/hpf (ref 0–5)
Specific Gravity, Urine: 1.025 (ref 1.005–1.030)
Specific Gravity, Urine: 1.018 (ref 1.005–1.030)
WBC, UA: >50 WBC/hpf (ref 0–5)
pH: 6 (ref 5.0–8.0)
pH: 5 (ref 5.0–8.0)

## 2022-06-06 LAB — HEMOGLOBIN AND HEMATOCRIT, BLOOD
HCT: 29.5 % — ABNORMAL LOW (ref 39.0–52.0)
Hemoglobin: 9.6 g/dL — ABNORMAL LOW (ref 13.0–17.0)

## 2022-06-06 LAB — CLOSTRIDIUM DIFFICILE BY PCR, REFLEXED: Toxigenic C. Difficile by PCR: NEGATIVE

## 2022-06-06 LAB — FERRITIN: Ferritin: 2942 ng/mL — ABNORMAL HIGH (ref 24–336)

## 2022-06-06 LAB — PHOSPHORUS
Phosphorus: 2.9 mg/dL (ref 2.5–4.6)
Phosphorus: 2.8 mg/dL (ref 2.5–4.6)

## 2022-06-06 LAB — VITAMIN B12: Vitamin B-12: 940 pg/mL — ABNORMAL HIGH (ref 180–914)

## 2022-06-06 LAB — RETICULOCYTES
Immature Retic Fract: 4.1 % (ref 2.3–15.9)
RBC.: 2.64 MIL/uL — ABNORMAL LOW (ref 4.22–5.81)
Retic Ct Pct: <0.4 % — ABNORMAL LOW (ref 0.4–3.1)

## 2022-06-06 LAB — APTT: aPTT: 36 seconds (ref 24–36)

## 2022-06-06 LAB — OSMOLALITY: Osmolality: 294 mOsm/kg (ref 275–295)

## 2022-06-06 LAB — URINALYSIS, MICROSCOPIC (REFLEX)

## 2022-06-06 LAB — CK: Total CK: 764 U/L — ABNORMAL HIGH (ref 49–397)

## 2022-06-06 LAB — CREATININE, URINE, RANDOM: Creatinine, Urine: 127 mg/dL

## 2022-06-06 LAB — FOLATE: Folate: 17.6 ng/mL (ref >5.9)

## 2022-06-06 LAB — PREALBUMIN: Prealbumin: <5 mg/dL — ABNORMAL LOW (ref 18–38)

## 2022-06-06 LAB — CORTISOL: Cortisol, Plasma: 24.6 ug/dL

## 2022-06-06 LAB — TSH: TSH: 0.673 u[IU]/mL (ref 0.350–4.500)

## 2022-06-06 LAB — PROTIME-INR
INR: 1.1 (ref 0.8–1.2)
Prothrombin Time: 14 seconds (ref 11.4–15.2)

## 2022-06-06 LAB — STREP PNEUMONIAE URINARY ANTIGEN: Strep Pneumo Urinary Antigen: NEGATIVE

## 2022-06-06 LAB — SODIUM, URINE, RANDOM: Sodium, Ur: <10 mmol/L

## 2022-06-06 LAB — MAGNESIUM: Magnesium: 1.7 mg/dL (ref 1.7–2.4)

## 2022-06-06 LAB — PROCALCITONIN
Procalcitonin: 13.06 ng/mL
Procalcitonin: 13.04 ng/mL

## 2022-06-06 LAB — LACTIC ACID, PLASMA: Lactic Acid, Venous: 2.6 mmol/L (ref 0.5–1.9)

## 2022-06-06 LAB — OSMOLALITY, URINE: Osmolality, Ur: 484 mOsm/kg (ref 300–900)

## 2022-06-06 LAB — AMMONIA: Ammonia: 11 umol/L (ref 9–35)

## 2022-06-06 MED ORDER — SODIUM CHLORIDE 0.9 % IV SOLN: INTRAVENOUS | Status: AC

## 2022-06-06 MED ORDER — ALBUMIN HUMAN 25 % IV SOLN
12.5000 g | Freq: Once | INTRAVENOUS | Status: AC
  Administered 2022-06-06: 12.5 g via INTRAVENOUS
  Filled 2022-06-06: qty 50

## 2022-06-06 MED ORDER — TBO-FILGRASTIM 300 MCG/0.5ML ~~LOC~~ SOSY
300.0000 ug | PREFILLED_SYRINGE | Freq: Every day | SUBCUTANEOUS | Status: AC
  Administered 2022-06-06 – 2022-06-08 (×3): 300 ug via SUBCUTANEOUS
  Filled 2022-06-06 (×3): qty 0.5

## 2022-06-06 MED ORDER — ATORVASTATIN CALCIUM 40 MG PO TABS: 20.0000 mg | ORAL_TABLET | Freq: Every day | ORAL | Status: DC

## 2022-06-06 MED ORDER — SODIUM CHLORIDE 0.9 % IV SOLN: INTRAVENOUS | Status: DC | PRN

## 2022-06-06 MED ORDER — POTASSIUM CHLORIDE 10 MEQ/100ML IV SOLN
10.0000 meq | INTRAVENOUS | Status: AC
  Administered 2022-06-06 (×3): 10 meq via INTRAVENOUS
  Filled 2022-06-06 (×3): qty 100

## 2022-06-06 MED ORDER — PROCHLORPERAZINE EDISYLATE 10 MG/2ML IJ SOLN
5.0000 mg | Freq: Four times a day (QID) | INTRAMUSCULAR | Status: AC | PRN
  Administered 2022-06-06 (×2): 5 mg via INTRAVENOUS
  Filled 2022-06-06 (×2): qty 2

## 2022-06-06 MED ORDER — ORAL CARE MOUTH RINSE: 15.0000 mL | OROMUCOSAL | Status: DC | PRN

## 2022-06-06 MED ORDER — CHLORHEXIDINE GLUCONATE CLOTH 2 % EX PADS
6.0000 | MEDICATED_PAD | Freq: Every day | CUTANEOUS | Status: DC
  Administered 2022-06-06 – 2022-06-13 (×8): 6 via TOPICAL

## 2022-06-06 MED ORDER — SUCRALFATE 1 G PO TABS
1.0000 g | ORAL_TABLET | Freq: Three times a day (TID) | ORAL | Status: DC
  Administered 2022-06-06 – 2022-06-07 (×2): 1 g via ORAL
  Filled 2022-06-06 (×4): qty 1

## 2022-06-06 MED ORDER — HYDROCODONE-ACETAMINOPHEN 5-325 MG PO TABS
1.0000 | ORAL_TABLET | ORAL | Status: DC | PRN
  Administered 2022-06-07: 2 via ORAL
  Filled 2022-06-06: qty 2

## 2022-06-06 MED ORDER — SODIUM CHLORIDE 0.9 % IV SOLN: INTRAVENOUS | Status: DC

## 2022-06-06 MED ORDER — SODIUM CHLORIDE 0.9 % IV SOLN
2.0000 g | INTRAVENOUS | Status: DC
  Administered 2022-06-06 – 2022-06-13 (×8): 2 g via INTRAVENOUS
  Filled 2022-06-06 (×8): qty 20

## 2022-06-06 MED ORDER — ACETAMINOPHEN 325 MG PO TABS: 650.0000 mg | ORAL_TABLET | Freq: Four times a day (QID) | ORAL | Status: DC | PRN

## 2022-06-06 MED ORDER — POTASSIUM CHLORIDE CRYS ER 20 MEQ PO TBCR: 40.0000 meq | EXTENDED_RELEASE_TABLET | ORAL | Status: DC

## 2022-06-06 MED ORDER — LOPERAMIDE HCL 2 MG PO CAPS: 2.0000 mg | ORAL_CAPSULE | Freq: Three times a day (TID) | ORAL | Status: DC | PRN

## 2022-06-06 MED ORDER — ACETAMINOPHEN 650 MG RE SUPP: 650.0000 mg | Freq: Four times a day (QID) | RECTAL | Status: DC | PRN

## 2022-06-06 MED ORDER — ALBUMIN HUMAN 25 % IV SOLN
25.0000 g | Freq: Four times a day (QID) | INTRAVENOUS | Status: AC
  Administered 2022-06-06 (×3): 25 g via INTRAVENOUS
  Filled 2022-06-06 (×3): qty 100

## 2022-06-06 NOTE — Progress Notes (Signed)
OT Cancellation Note  Patient Details Name: Jesse [name] MRN: [id number] DOB: 1950/06/01   Cancelled Treatment:    Reason Eval/Treat Not Completed: Patient not medically ready. Bps continue to be soft and patient lethargic. Will hold therapy today and f/u as able.    Lella Mullany L Marrianne Sica 06/06/2022, 10:04 AM

## 2022-06-06 NOTE — TOC Initial Note (Signed)
Transition of Care [facility]) - Initial/Assessment Note    Patient Details  Name: Jesse [name] MRN: [id number] Date of Birth: 08-10-1950  Transition of Care [facility]) CM/SW Contact:    Ninfa Meeker, RN Phone Number: 06/06/2022, 1:04 PM  Clinical Narrative:                 Transition of Care Screening Note:  Transition of Care [facility]) Department has reviewed patient and no TOC needs have been identified at this time. We will continue to monitor patient advancement through Interdisciplinary progressions and if new patient needs arise, please place a consult.        Patient Goals and CMS Choice            Expected Discharge Plan and Services                                              Prior Living Arrangements/Services                       Activities of Daily Living Home Assistive Devices/Equipment: None ADL Screening (condition at time of admission) Patient's cognitive ability adequate to safely complete daily activities?: Yes Is the patient deaf or have difficulty hearing?: No Does the patient have difficulty seeing, even when wearing glasses/contacts?: No Does the patient have difficulty concentrating, remembering, or making decisions?: No Patient able to express need for assistance with ADLs?: Yes Does the patient have difficulty dressing or bathing?: No Independently performs ADLs?: Yes (appropriate for developmental age) Does the patient have difficulty walking or climbing stairs?: No Weakness of Legs: Both Weakness of Arms/Hands: Both  Permission Sought/Granted                  Emotional Assessment              Admission diagnosis:  Hypokalemia [E87.6] Sepsis ([place]) [A41.9] Severe sepsis ([place]) [A41.9, R65.20] Pneumonia of right lung due to infectious organism, unspecified part of lung [J18.9] Sepsis, due to unspecified organism, unspecified whether acute organ dysfunction present [facility]) [A41.9] Patient Active Problem List    Diagnosis Date Noted   Bacteremia due to Klebsiella pneumoniae 06/06/2022   Pneumonia of right middle lobe due to Klebsiella pneumoniae ([place]) 06/06/2022   Severe sepsis ([place]) 06/05/2022   Hypokalemia 06/05/2022   AKI (acute kidney injury) ([place]) 06/05/2022   Hyponatremia 03/50/0938   Acute metabolic encephalopathy 18/29/9371   Acute respiratory failure with hypoxia ([place]) 06/05/2022   CAP (community acquired pneumonia) 06/05/2022   Thrombocytopenia ([place]) 06/05/2022   Sepsis ([place]) 06/05/2022   Diarrhea 06/05/2022   Weight loss 05/28/2022   Malignant neoplasm of lung ([place]) 04/30/2022   Adenopathy 04/06/2022   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   Encounter for antineoplastic chemotherapy 01/28/2020   Adenocarcinoma of left lung, stage 2 ([place]) 12/31/2019   Goals of care, counseling/discussion 12/31/2019   S/P lobectomy of lung 12/16/2019   Lung nodule 10/13/2019   Essential hypertension 09/28/2016   Coronary artery calcification 09/28/2016   Impaired glucose tolerance 09/28/2016   NEOPLASM, MALIGNANT, BLADDER, HX OF 07/18/2007   Vitamin D deficiency 06/03/2007   DIVERTICULOSIS, COLON 01/14/2007   Osteoarthritis 01/14/2007   Dyslipidemia 01/09/2007   GERD 01/09/2007   REFLUX ESOPHAGITIS 06/01/2004   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   PCP:  [name], MD Pharmacy:  [id number] - [place], [place] - [address] [place] [address] Phone: [phone] Fax: [phone]     Social Determinants of Health (SDOH) Social History: SDOH Screenings   Food Insecurity: No Food Insecurity (06/06/2022)  Housing: Low Risk  (06/06/2022)  Transportation Needs: No Transportation Needs (06/06/2022)  Utilities: Not At Risk (06/06/2022)  Depression (PHQ2-9): Low Risk  (05/07/2022)  Financial Resource Strain: Low Risk  (07/18/2021)  Physical Activity: Sufficiently Active (07/18/2021)   Social Connections: Moderately Isolated (07/12/2020)  Stress: No Stress Concern Present (07/18/2021)  Tobacco Use: Medium Risk (06/05/2022)   SDOH Interventions:     Readmission Risk Interventions     No data to display

## 2022-06-06 NOTE — ED Notes (Signed)
Bladder scanned pt, pt retained 239 mL of urine in bladder.

## 2022-06-06 NOTE — Progress Notes (Signed)
PT Cancellation Note  Patient Details Name: [name] MRN: [id number] DOB: 08-06-1950   Cancelled Treatment:    Reason Eval/Treat Not Completed: Other (comment)  Spoke with RN and pt unresponsive/slow to respond.  Noted pt just admitted 15 hr.  Will hold therapy until tomorrow for improved ability to participate.   Abran Richard, PT Acute Rehab [facility] [place] 06/06/2022, 9:55 AM

## 2022-06-06 NOTE — Progress Notes (Addendum)
[facility]                                                                              Jesse [name], is a 72 y.o. male, DOB - 06/14/1950, HBZ:[id number] Admit date - 06/05/2022    Outpatient Primary MD for the patient is Volanda Napoleon, Langley Adie, MD  LOS - 1  days  Chief Complaint  Patient presents with   Weakness       Brief summary   Patient is a 72 year old male with lung CA, currently undergoing chemotherapy, XRT, HTN, HLP, history of bladder cancer, GERD, hypokalemia, COPD presented with severe fatigue and confusion.  Patient was found to be unresponsive on the day of admission, has been generally weak and lethargic with poor p.o. intake.  EMS was called and O2 sats were 97 on room air, however look to cyanotic and patient was placed on 4 L O2.  BP soft in 80s and patient was given normal saline bolus. Per family, had not been acting right for last 4 days prior to admission. Patient was seen at cancer center on 12/19 for chemo appointment and was found to be disoriented and chemo was held.  He was found to have hypokalemia and received IV potassium.  He felt better and went home however started feeling worse again. In ED temp 102 F, heart rate 131, BP 107/72, O2 sats 97% on room air Labs showed sodium 130, potassium 2.6, BUN 53, creatinine 1.85, creatinine was 1.09 on 06/04/2022 CBC showed WBCs 1.1, was 1.9 on 06/04/2022 Flu, RSV, COVID-19 negative Patient also had multiple episodes of diarrhea, C. difficile showed antigen positive but negative toxin  Chest x-ray showed patchy right infrahilar airspace disease, new  Assessment & Plan    Principal Problem: Severe sepsis ([place]) likely secondary to right infrahilar pneumonia Neutropenia with fevers Klebsiella pneumonia bacteremia Diarrhea -Patient meets criteria with severe sepsis, febrile, tachycardia, hypotensive, AKI, source likely due to pneumonia -Has received IV fluid bolus, continue Ringer lactate, obtain  random cortisol level, procalcitonin, blood cultures -Obtain urine Legionella antigen,  SLP evaluation.  Urine strep antigen negative -Continue IV vancomycin, cefepime, Zithromax -Blood cultures positive for Klebsiella pneumonia.   -Also having significant diarrhea, C. difficile antigen positive, negative toxin. -Will consult infectious disease for recommendations. -Continue IV fluids, added IV albumin.  If cortisol level low, will benefit from Solu-Cortef.   Active Problems: Diarrhea -Unclear etiology, possible due to chemotherapy. C. difficile antigen positive, negative toxin, GI pathogen panel pending -Will consult ID   Pancytopenia with neutropenia, anemia and thrombocytopenia(HCC) -Continue neutropenic precautions, IV cefepime -Platelets trending down, follow closely, hemoglobin 7.9, baseline 9-10 -Will transfuse platelets if bleeding or less than 20 K -Will consult oncology Addendum D/w Dr. Julien Nordmann, recommended Neupogen and will evaluate patient.       Dyslipidemia -Hold Lipitor, until LFTs improved   Hypotension with history of essential hypertension -Currently BP soft, over 1970s, continue IV fluid hydration, added IV albumin, follow random cortisol level    Adenocarcinoma of left lung, stage 2 (HCC) neutropenia -Chemotherapy currently on hold, continue IV antibiotics - WBC improving     Hypokalemia -Replaced  AKI (acute kidney injury) ([place]) -Likely due to to severe sepsis, dehydration with poor p.o. intake -Continue IV fluids creatinine 1.85 on admission, baseline 1.09 on 06/04/2022 - hold nephrotoxic meds, creatinine improving to 1.6 today    Hyponatremia -Likely due to poor p.o. intake, continue IV fluid hydration with normal saline     Acute metabolic encephalopathy -Likely due to #1, continue current management, if no significant improvement, will obtain MRI of the brain for further workup    Acute respiratory failure with hypoxia (HCC) -O2 sats 98%  on 3 L, likely due to #1,  -wean O2 as tolerated  Acute transaminitis -Likely due to #1, hold Lipitor, continue to trend LFTs  Estimated body mass index is 21.39 kg/m as calculated from the following:   Height as of this encounter: 5\' 9"  (1.753 m).   Weight as of this encounter: 65.7 kg.  Code Status: Full CODE STATUS DVT Prophylaxis:  SCDs Start: 06/06/22 0153   Level of Care: Level of care: Stepdown Family Communication:  Disposition Plan:      Remains inpatient appropriate: Acute medical conditions preclude any safe discharge.  Continue monitoring in stepdown unit   Procedures:  None  Consultants:   Infectious disease Oncology  Antimicrobials:   Anti-infectives (From admission, onward)    Start     Dose/Rate Route Frequency Ordered Stop   06/07/22 1200  vancomycin (VANCOCIN) IVPB 1000 mg/200 mL premix        1,000 mg 200 mL/hr over 60 Minutes Intravenous Every 36 hours 06/05/22 2323     06/06/22 0200  ceFEPIme (MAXIPIME) 2 g in sodium chloride 0.9 % 100 mL IVPB        2 g 200 mL/hr over 30 Minutes Intravenous Every 12 hours 06/05/22 2323     06/05/22 2315  vancomycin (VANCOCIN) IVPB 1000 mg/200 mL premix        1,000 mg 200 mL/hr over 60 Minutes Intravenous  Once 06/05/22 2307 06/06/22 0145   06/05/22 1900  cefTRIAXone (ROCEPHIN) 2 g in sodium chloride 0.9 % 100 mL IVPB  Status:  Discontinued        2 g 200 mL/hr over 30 Minutes Intravenous Every 24 hours 06/05/22 1847 06/05/22 2323   06/05/22 1900  azithromycin (ZITHROMAX) 500 mg in sodium chloride 0.9 % 250 mL IVPB        500 mg 250 mL/hr over 60 Minutes Intravenous Every 24 hours 06/05/22 1847 06/10/22 1859   06/05/22 1830  ceFEPIme (MAXIPIME) 2 g in sodium chloride 0.9 % 100 mL IVPB  Status:  Discontinued        2 g 200 mL/hr over 30 Minutes Intravenous  Once 06/05/22 1820 06/05/22 1847   06/05/22 1830  metroNIDAZOLE (FLAGYL) IVPB 500 mg  Status:  Discontinued        500 mg 100 mL/hr over 60 Minutes  Intravenous  Once 06/05/22 1820 06/05/22 1847   06/05/22 1830  vancomycin (VANCOCIN) IVPB 1000 mg/200 mL premix  Status:  Discontinued        1,000 mg 200 mL/hr over 60 Minutes Intravenous  Once 06/05/22 1820 06/05/22 1826   06/05/22 1830  vancomycin (VANCOREADY) IVPB 1500 mg/300 mL  Status:  Discontinued        1,500 mg 150 mL/hr over 120 Minutes Intravenous  Once 06/05/22 1826 06/05/22 1847          Medications  atorvastatin  20 mg Oral Daily   Chlorhexidine Gluconate Cloth  6 each Topical Daily  sucralfate  1 g Oral TID WC & HS      Subjective:   Jesse [name] was seen and examined today.  Lethargic, somewhat somnolent, overnight issues noted with fevers, hypotensive.  Currently no acute nausea vomiting however having significant diarrhea.    Objective:   Vitals:   06/06/22 0400 06/06/22 0402 06/06/22 0500 06/06/22 0809  BP: (!) 79/56 (!) 84/54 (!) 94/48   Pulse: (!) 102 (!) 103    Resp: 18 18 17    Temp:    98.1 F (36.7 C)  TempSrc:    Oral  SpO2: 97% 97%    Weight:      Height:        Intake/Output Summary (Last 24 hours) at 06/06/2022 1017 Last data filed at 06/06/2022 0539 Gross per 24 hour  Intake 6538.34 ml  Output 358 ml  Net 6180.34 ml     Wt Readings from Last 3 Encounters:  06/06/22 65.7 kg  06/04/22 61.6 kg  05/28/22 64 kg     Exam General: Awake and oriented x self, place, ill-appearing Cardiovascular: S1 S2 auscultated,  RRR Respiratory: Decreased breath sound at the bases, no wheezing Gastrointestinal: Soft, nontender, nondistended, + bowel sounds Ext: no pedal edema bilaterally Neuro: Strength 5/5 upper and lower extremities bilaterally Skin: No rashes Psych: somewhat lethargic,    Data Reviewed:  I have personally reviewed following labs    CBC Lab Results  Component Value Date   WBC 1.0 (LL) 06/06/2022   RBC 2.50 (L) 06/06/2022   HGB 7.9 (L) 06/06/2022   HCT 24.0 (L) 06/06/2022   MCV 96.0 06/06/2022   MCH 31.6  06/06/2022   PLT 38 (L) 06/06/2022   MCHC 32.9 06/06/2022   RDW 15.0 06/06/2022   LYMPHSABS 0.0 (L) 06/06/2022   MONOABS 0.0 (L) 06/06/2022   EOSABS 0.0 06/06/2022   BASOSABS 0.0 47/12/6281     Last metabolic panel Lab Results  Component Value Date   NA 131 (L) 06/06/2022   K 3.0 (L) 06/06/2022   CL 99 06/06/2022   CO2 22 06/06/2022   BUN 50 (H) 06/06/2022   CREATININE 1.67 (H) 06/06/2022   GLUCOSE 109 (H) 06/06/2022   GFRNONAA 43 (L) 06/06/2022   GFRAA >60 12/31/2019   CALCIUM 7.9 (L) 06/06/2022   PHOS 2.8 06/06/2022   PROT 4.5 (L) 06/06/2022   ALBUMIN 2.0 (L) 06/06/2022   BILITOT 1.0 06/06/2022   ALKPHOS 70 06/06/2022   AST 107 (H) 06/06/2022   ALT 52 (H) 06/06/2022   ANIONGAP 10 06/06/2022    CBG (last 3)  Recent Labs    06/06/22 0324 06/06/22 0806  GLUCAP 88 81      Coagulation Profile: Recent Labs  Lab 06/06/22 0035  INR 1.1     Radiology Studies: I have personally reviewed the imaging studies  DG Chest Port 1 View  Result Date: 06/05/2022 CLINICAL DATA:  Per chart: Pt arrived via EMS, weakness and lethargic since saturday, poor PO intake. Lung cancer on the left side, no nausea, diarrhea is typical for him, had radiation today. EXAM: PORTABLE CHEST - 1 VIEW COMPARISON:  01/29/2020 FINDINGS: Suspect patchy right infrahilar airspace disease. Post partial left pneumonectomy. Heart size and mediastinal contours are within normal limits. No effusion. Right acromioclavicular spurring. IMPRESSION: Patchy right infrahilar airspace disease, new since previous Electronically Signed   By: Lucrezia Europe M.D.   On: 06/05/2022 18:37       [place] Jesse [name] M.D. [facility] 06/06/2022, 10:17 AM  Available  via Epic secure chat 7am-7pm After 7 pm, please refer to night coverage provider listed on amion.

## 2022-06-06 NOTE — Consult Note (Signed)
[facility]    Date of Admission:  06/05/2022   Total days of antibiotics: 1 vanco/cefepime/flagyl               Reason for Consult: Klebsiella bacteremia, pneumonia    Referring Provider: Rai   Assessment: Klebsiella bacteremia Lung Cancer, last CTX 2-11 per family Previous Bladder Cancer AKI  Plan: Change anbx to ceftriaxone Repeat BCx Follow his clinical exam Follow ANC H/o eval  His ANC is quite loqw and fits the time course of his chemo nadir. Hopefully will improve. Ask h/o if GCSF would be indicated?  Thank you so much for this interesting consult,  Principal Problem:   Severe sepsis ([place]) Active Problems:   Dyslipidemia   Essential hypertension   Adenocarcinoma of left lung, stage 2 (HCC)   Hypokalemia   AKI (acute kidney injury) ([place])   Hyponatremia   Acute metabolic encephalopathy   Acute respiratory failure with hypoxia (HCC)   CAP (community acquired pneumonia)   Thrombocytopenia (HCC)   Sepsis ([place])   Diarrhea    Chlorhexidine Gluconate Cloth  6 each Topical Daily   sucralfate  1 g Oral TID WC & HS    HPI: Jesse [name] is a 72 y.o. male with hx of lung cancer (last CTX 05-27-22, XR due 06-13-22 held), COPD, previous C diff, on 2-20 was found unresponsive by his family at home. He was picked up by EMS and his SpO2 was 97/RA, SBP 80s. In ED had temp 102, HR 121, 107/72, 97%/RA.  He was found to have R infrahilar PNA on his CXR. He was started on vanco/cefepime/azithro. He was noted to have WBC 1.9 (down to 0.4) and noted to have AKI 1.67 (prev 0.81). "Not acting right" per family for 4 days pta.  His BCx have grown Kleb pneumo in 2/2. Greasy 300  Review of Systems: Review of Systems  Constitutional:  Positive for fever and malaise/fatigue.  HENT:  Negative for sore throat.   Gastrointestinal:  Negative for diarrhea.  No oral ulcers, no port.   Past Medical History:  Diagnosis Date   Anemia    Cancer ([place])     bladder cancer   COPD (chronic obstructive pulmonary disease) ([place])    mild    DIVERTICULOSIS, COLON 01/14/2007   Qualifier: Diagnosis of  By: Paulina Fusi RN, [name]    GASTRITIS, CHRONIC 06/01/2004   Qualifier: Diagnosis of  By: Nolon Rod CMA ([place]), Robin     GERD 01/09/2007   Qualifier: Diagnosis of  By: Sherren Mocha, RN, Ellen     Heart murmur    as a child; no murmur heard 12/14/19   HYPERLIPIDEMIA 01/09/2007   Qualifier: Diagnosis of  By: Sherren Mocha RN, Dorian Pod     Hypertension    Lung mass    left upper nodule   Macular degeneration    right eye   NEOPLASM, MALIGNANT, BLADDER, HX OF 2002   Qualifier: Diagnosis of  By: Nolon Rod CMA (AAMA), Robin  / no chemo or radiation   OSTEOARTHRITIS 01/14/2007   Qualifier: Diagnosis of  By: Paulina Fusi, RN, [name] - knees   Rectal mass    Reflux esophagitis 06/01/2004   Qualifier: Diagnosis of  By: Nolon Rod CMA (AAMA), Robin     TOBACCO USER 02/16/2009   Qualifier: Diagnosis of  By: Burnice Logan  MD, Doretha Sou    VITAMIN D DEFICIENCY 06/03/2007   Qualifier: Diagnosis of  By: Burnice Logan  MD, Collier Salina  F     Social History   Tobacco Use   Smoking status: Former    Packs/day: 0.50    Years: 40.00    Total pack years: 20.00    Types: Cigarettes    Quit date: 09/21/2019    Years since quitting: 2.7   Smokeless tobacco: Never  Vaping Use   Vaping Use: Never used  Substance Use Topics   Alcohol use: Yes    Alcohol/week: 15.0 standard drinks of alcohol    Types: 15 Standard drinks or equivalent per week    Comment: wine/beer/martini   Drug use: Not Currently    Types: Marijuana    Comment: Smokes marijuana occasional, last use in 08/2019    Family History  Problem Relation Age of Onset   Dementia Mother    Diabetes Father    Mental illness Father    Peripheral vascular disease Brother      Medications: I have reviewed the patient's current medications.  Abtx:  Anti-infectives (From admission, onward)    Start     Dose/Rate Route Frequency  Ordered Stop   06/07/22 1200  vancomycin (VANCOCIN) IVPB 1000 mg/200 mL premix        1,000 mg 200 mL/hr over 60 Minutes Intravenous Every 36 hours 06/05/22 2323     06/06/22 0200  ceFEPIme (MAXIPIME) 2 g in sodium chloride 0.9 % 100 mL IVPB        2 g 200 mL/hr over 30 Minutes Intravenous Every 12 hours 06/05/22 2323     06/05/22 2315  vancomycin (VANCOCIN) IVPB 1000 mg/200 mL premix        1,000 mg 200 mL/hr over 60 Minutes Intravenous  Once 06/05/22 2307 06/06/22 0145   06/05/22 1900  cefTRIAXone (ROCEPHIN) 2 g in sodium chloride 0.9 % 100 mL IVPB  Status:  Discontinued        2 g 200 mL/hr over 30 Minutes Intravenous Every 24 hours 06/05/22 1847 06/05/22 2323   06/05/22 1900  azithromycin (ZITHROMAX) 500 mg in sodium chloride 0.9 % 250 mL IVPB        500 mg 250 mL/hr over 60 Minutes Intravenous Every 24 hours 06/05/22 1847 06/10/22 1859   06/05/22 1830  ceFEPIme (MAXIPIME) 2 g in sodium chloride 0.9 % 100 mL IVPB  Status:  Discontinued        2 g 200 mL/hr over 30 Minutes Intravenous  Once 06/05/22 1820 06/05/22 1847   06/05/22 1830  metroNIDAZOLE (FLAGYL) IVPB 500 mg  Status:  Discontinued        500 mg 100 mL/hr over 60 Minutes Intravenous  Once 06/05/22 1820 06/05/22 1847   06/05/22 1830  vancomycin (VANCOCIN) IVPB 1000 mg/200 mL premix  Status:  Discontinued        1,000 mg 200 mL/hr over 60 Minutes Intravenous  Once 06/05/22 1820 06/05/22 1826   06/05/22 1830  vancomycin (VANCOREADY) IVPB 1500 mg/300 mL  Status:  Discontinued        1,500 mg 150 mL/hr over 120 Minutes Intravenous  Once 06/05/22 1826 06/05/22 1847         OBJECTIVE: Blood pressure (!) 94/48, pulse (!) 103, temperature 98.1 F (36.7 C), temperature source Oral, resp. rate 17, height 5\' 9"  (1.753 m), weight 65.7 kg, SpO2 97 %.  Physical Exam Vitals reviewed.  Constitutional:      Appearance: He is ill-appearing.  HENT:     Mouth/Throat:     Mouth: Mucous membranes are moist.  Pharynx: No  oropharyngeal exudate.  Eyes:     Extraocular Movements: Extraocular movements intact.     Pupils: Pupils are equal, round, and reactive to light.  Cardiovascular:     Rate and Rhythm: Normal rate and regular rhythm.  Pulmonary:     Effort: Pulmonary effort is normal.     Breath sounds: Normal breath sounds.  Abdominal:     General: Bowel sounds are normal. There is no distension.     Palpations: Abdomen is soft.     Tenderness: There is no abdominal tenderness.  Musculoskeletal:     Cervical back: Normal range of motion and neck supple.     Right lower leg: No edema.     Left lower leg: No edema.  Neurological:     General: No focal deficit present.     Mental Status: He is alert.     Lab Results Results for orders placed or performed during the hospital encounter of 06/05/22 (from the past 48 hour(s))  Resp panel by RT-PCR (RSV, Flu A&B, Covid) Anterior Nasal Swab     Status: None   Collection Time: 06/05/22  6:20 PM   Specimen: Anterior Nasal Swab  Result Value Ref Range   SARS Coronavirus 2 by RT PCR NEGATIVE NEGATIVE    Comment: (NOTE) SARS-CoV-2 target nucleic acids are NOT DETECTED.  The SARS-CoV-2 RNA is generally detectable in upper respiratory specimens during the acute phase of infection. The lowest concentration of SARS-CoV-2 viral copies this assay can detect is 138 copies/mL. A negative result does not preclude SARS-Cov-2 infection and should not be used as the sole basis for treatment or other patient management decisions. A negative result may occur with  improper specimen collection/handling, submission of specimen other than nasopharyngeal swab, presence of viral mutation(s) within the areas targeted by this assay, and inadequate number of viral copies(<138 copies/mL). A negative result must be combined with clinical observations, patient history, and epidemiological information. The expected result is Negative.  Fact Sheet for Patients:   EntrepreneurPulse.com.au  Fact Sheet for Healthcare Providers:  IncredibleEmployment.be  This test is no t yet approved or cleared by the [place] FDA and  has been authorized for detection and/or diagnosis of SARS-CoV-2 by FDA under an Emergency Use Authorization (EUA). This EUA will remain  in effect (meaning this test can be used) for the duration of the COVID-19 declaration under Section 564(b)(1) of the Act, 21 U.S.C.section 360bbb-3(b)(1), unless the authorization is terminated  or revoked sooner.       Influenza A by PCR NEGATIVE NEGATIVE   Influenza B by PCR NEGATIVE NEGATIVE    Comment: (NOTE) The Xpert Xpress SARS-CoV-2/FLU/RSV plus assay is intended as an aid in the diagnosis of influenza from Nasopharyngeal swab specimens and should not be used as a sole basis for treatment. Nasal washings and aspirates are unacceptable for Xpert Xpress SARS-CoV-2/FLU/RSV testing.  Fact Sheet for Patients: EntrepreneurPulse.com.au  Fact Sheet for Healthcare Providers: IncredibleEmployment.be  This test is not yet approved or cleared by the [place] FDA and has been authorized for detection and/or diagnosis of SARS-CoV-2 by FDA under an Emergency Use Authorization (EUA). This EUA will remain in effect (meaning this test can be used) for the duration of the COVID-19 declaration under Section 564(b)(1) of the Act, 21 U.S.C. section 360bbb-3(b)(1), unless the authorization is terminated or revoked.     Resp Syncytial Virus by PCR NEGATIVE NEGATIVE    Comment: (NOTE) Fact Sheet for Patients: EntrepreneurPulse.com.au  Fact Sheet  for Healthcare Providers: IncredibleEmployment.be  This test is not yet approved or cleared by the [place] and has been authorized for detection and/or diagnosis of SARS-CoV-2 by FDA under an Emergency Use Authorization (EUA).  This EUA will remain in effect (meaning this test can be used) for the duration of the COVID-19 declaration under Section 564(b)(1) of the Act, 21 U.S.C. section 360bbb-3(b)(1), unless the authorization is terminated or revoked.  Performed at [facility], [place] [address] [place], [place] [address]   Blood culture (routine x 2)     Status: None (Preliminary result)   Collection Time: 06/05/22  6:46 PM   Specimen: BLOOD  Result Value Ref Range   Specimen Description      BLOOD BLOOD RIGHT ARM Performed at [facility] [address] [place], [place] [address]    Special Requests      BOTTLES DRAWN AEROBIC AND ANAEROBIC Blood Culture adequate volume Performed at [facility] [address] [place], [place] [address]    Culture  Setup Time      GRAM NEGATIVE RODS IN BOTH AEROBIC AND ANAEROBIC BOTTLES CRITICAL RESULT CALLED TO, READ BACK BY AND VERIFIED WITH: PHARMD M.BELL AT 1019 ON 06/06/2022 BY T.SAAD. Performed at [facility], [place] [address], [place], [place] [address]    Culture GRAM NEGATIVE RODS    Report Status PENDING   Lactic acid, plasma     Status: None   Collection Time: 06/05/22  6:46 PM  Result Value Ref Range   Lactic Acid, Venous 1.5 0.5 - 1.9 mmol/L    Comment: Performed at [facility], [place] [address] [place], [place] [address]  CBC with Differential     Status: Abnormal   Collection Time: 06/05/22  6:46 PM  Result Value Ref Range   WBC 1.1 (LL) 4.0 - 10.5 K/uL    Comment: REPEATED TO VERIFY WHITE COUNT CONFIRMED ON SMEAR THIS CRITICAL RESULT HAS VERIFIED AND BEEN CALLED TO RN A HAMPTON BY ALEXIS Lueders ON 02 20 2024 AT 1911, AND HAS BEEN READ BACK.     RBC 2.96 (L) 4.22 - 5.81 MIL/uL   Hemoglobin 9.2 (L) 13.0 - 17.0 g/dL   HCT 26.9 (L) 39.0 - 52.0 %   MCV 90.9 80.0 - 100.0 fL   MCH 31.1 26.0 - 34.0 pg   MCHC 34.2 30.0 - 36.0 g/dL   RDW 14.5 11.5 - 15.5 %    Platelets 59 (L) 150 - 400 K/uL    Comment: SPECIMEN CHECKED FOR CLOTS Immature Platelet Fraction may be clinically indicated, consider ordering this additional test [id number] REPEATED TO VERIFY PLATELET COUNT CONFIRMED BY SMEAR    nRBC 0.0 0.0 - 0.2 %   Neutrophils Relative % 85 %   Neutro Abs 0.9 (L) 1.7 - 7.7 K/uL   Lymphocytes Relative 5 %   Lymphs Abs 0.1 (L) 0.7 - 4.0 K/uL   Monocytes Relative 7 %   Monocytes Absolute 0.1 0.1 - 1.0 K/uL   Eosinophils Relative 0 %   Eosinophils Absolute 0.0 0.0 - 0.5 K/uL   Basophils Relative 1 %   Basophils Absolute 0.0 0.0 - 0.1 K/uL   Immature Granulocytes 2 %   Abs Immature Granulocytes 0.02 0.00 - 0.07 K/uL   Schistocytes PRESENT     Comment: RARE   Ovalocytes PRESENT     Comment: Performed at [facility], [place] [address] [place],  [address]  Comprehensive  metabolic panel     Status: Abnormal   Collection Time: 06/05/22  6:46 PM  Result Value Ref Range   Sodium 130 (L) 135 - 145 mmol/L   Potassium 2.6 (LL) 3.5 - 5.1 mmol/L    Comment: CRITICAL RESULT CALLED TO, READ BACK BY AND VERIFIED WITH J.FERRAINOLO, RN AT 2012 ON 02.20.24 BY N.THOMPSON    Chloride 96 (L) 98 - 111 mmol/L   CO2 25 22 - 32 mmol/L   Glucose, Bld 132 (H) 70 - 99 mg/dL    Comment: Glucose reference range applies only to samples taken after fasting for at least 8 hours.   BUN 53 (H) 8 - 23 mg/dL   Creatinine, Ser 1.85 (H) 0.61 - 1.24 mg/dL   Calcium 8.1 (L) 8.9 - 10.3 mg/dL   Total Protein 6.0 (L) 6.5 - 8.1 g/dL   Albumin 2.4 (L) 3.5 - 5.0 g/dL   AST 150 (H) 15 - 41 U/L   ALT 70 (H) 0 - 44 U/L   Alkaline Phosphatase 90 38 - 126 U/L   Total Bilirubin 1.1 0.3 - 1.2 mg/dL   GFR, Estimated 38 (L) >60 mL/min    Comment: (NOTE) Calculated using the CKD-EPI Creatinine Equation (2021)    Anion gap 9 5 - 15    Comment: Performed at [facility], [place] [address] [place], [place] [address]  Magnesium     Status: None    Collection Time: 06/05/22  6:46 PM  Result Value Ref Range   Magnesium 2.2 1.7 - 2.4 mg/dL    Comment: Performed at [facility], [place] [address], [place], [place] [address]  TSH     Status: None   Collection Time: 06/05/22  6:46 PM  Result Value Ref Range   TSH 0.673 0.350 - 4.500 uIU/mL    Comment: Performed by a 3rd Generation assay with a functional sensitivity of <=0.01 uIU/mL. Performed at [facility], [place] [address] [place], [place] [address]   Vitamin B12     Status: Abnormal   Collection Time: 06/05/22  6:46 PM  Result Value Ref Range   Vitamin B-12 940 (H) 180 - 914 pg/mL    Comment: (NOTE) This assay is not validated for testing neonatal or myeloproliferative syndrome specimens for Vitamin B12 levels. Performed at [facility], [place] [address] [place], [place] [address]   Folate     Status: None   Collection Time: 06/05/22  6:46 PM  Result Value Ref Range   Folate 17.6 >5.9 ng/mL    Comment: Performed at [facility], [place] [address], [place], [place] [address]  Iron and TIBC     Status: Abnormal   Collection Time: 06/05/22  6:46 PM  Result Value Ref Range   Iron 17 (L) 45 - 182 ug/dL   TIBC 176 (L) 250 - 450 ug/dL   Saturation Ratios 10 (L) 17.9 - 39.5 %   UIBC 159 ug/dL    Comment: Performed at [facility], [place] [address], [place], [place] [address]  Ferritin     Status: Abnormal   Collection Time: 06/05/22  6:46 PM  Result Value Ref Range   Ferritin 2,942 (H) 24 - 336 ng/mL    Comment: Performed at [facility], [place] [address], [place], [place] [address]  Blood Culture ID Panel (Reflexed)     Status: Abnormal   Collection Time: 06/05/22  6:46 PM  Result Value Ref Range  Enterococcus faecalis NOT DETECTED NOT DETECTED   Enterococcus Faecium NOT DETECTED NOT DETECTED   Listeria monocytogenes NOT DETECTED NOT DETECTED   Staphylococcus species NOT  DETECTED NOT DETECTED   Staphylococcus aureus (BCID) NOT DETECTED NOT DETECTED   Staphylococcus epidermidis NOT DETECTED NOT DETECTED   Staphylococcus lugdunensis NOT DETECTED NOT DETECTED   Streptococcus species NOT DETECTED NOT DETECTED   Streptococcus agalactiae NOT DETECTED NOT DETECTED   Streptococcus pneumoniae NOT DETECTED NOT DETECTED   Streptococcus pyogenes NOT DETECTED NOT DETECTED   A.calcoaceticus-baumannii NOT DETECTED NOT DETECTED   Bacteroides fragilis NOT DETECTED NOT DETECTED   Enterobacterales DETECTED (A) NOT DETECTED    Comment: Enterobacterales represent a large order of gram negative bacteria, not a single organism. CRITICAL RESULT CALLED TO, READ BACK BY AND VERIFIED WITH: PHARMD M.BELL AT 1019 ON 06/06/2022 BY T.SAAD.    Enterobacter cloacae complex NOT DETECTED NOT DETECTED   Escherichia coli NOT DETECTED NOT DETECTED   Klebsiella aerogenes NOT DETECTED NOT DETECTED   Klebsiella oxytoca NOT DETECTED NOT DETECTED   Klebsiella pneumoniae DETECTED (A) NOT DETECTED    Comment: CRITICAL RESULT CALLED TO, READ BACK BY AND VERIFIED WITH: PHARMD M.BELL AT 1019 ON 06/06/2022 BY T.SAAD.    Proteus species NOT DETECTED NOT DETECTED   Salmonella species NOT DETECTED NOT DETECTED   Serratia marcescens NOT DETECTED NOT DETECTED   Haemophilus influenzae NOT DETECTED NOT DETECTED   Neisseria meningitidis NOT DETECTED NOT DETECTED   Pseudomonas aeruginosa NOT DETECTED NOT DETECTED   Stenotrophomonas maltophilia NOT DETECTED NOT DETECTED   Candida albicans NOT DETECTED NOT DETECTED   Candida auris NOT DETECTED NOT DETECTED   Candida glabrata NOT DETECTED NOT DETECTED   Candida krusei NOT DETECTED NOT DETECTED   Candida parapsilosis NOT DETECTED NOT DETECTED   Candida tropicalis NOT DETECTED NOT DETECTED   Cryptococcus neoformans/gattii NOT DETECTED NOT DETECTED   CTX-M ESBL NOT DETECTED NOT DETECTED   Carbapenem resistance IMP NOT DETECTED NOT DETECTED   Carbapenem  resistance KPC NOT DETECTED NOT DETECTED   Carbapenem resistance NDM NOT DETECTED NOT DETECTED   Carbapenem resist OXA 48 LIKE NOT DETECTED NOT DETECTED   Carbapenem resistance VIM NOT DETECTED NOT DETECTED    Comment: Performed at [facility], [place] [address], [place], [place] [address]  Blood culture (routine x 2)     Status: None (Preliminary result)   Collection Time: 06/05/22  7:25 PM   Specimen: BLOOD RIGHT FOREARM  Result Value Ref Range   Specimen Description      BLOOD RIGHT FOREARM Performed at [facility], [place] [address], [place], [place] [address]    Special Requests      BOTTLES DRAWN AEROBIC AND ANAEROBIC Blood Culture results may not be optimal due to an excessive volume of blood received in culture bottles Performed at [facility] [address] [place], [place] [address]    Culture      NO GROWTH < 12 HOURS Performed at [facility] [address], [place], [place] [address]    Report Status PENDING   Urinalysis, Routine w reflex microscopic -Urine, Clean Catch     Status: Abnormal   Collection Time: 06/05/22 10:58 PM  Result Value Ref Range   Color, Urine YELLOW YELLOW    Comment: YELLOW   APPearance HAZY (A) CLEAR   Specific Gravity, Urine 1.025 1.005 - 1.030   pH 6.0 5.0 - 8.0   Glucose, UA NEGATIVE NEGATIVE mg/dL  Hgb urine dipstick LARGE (A) NEGATIVE   Bilirubin Urine SMALL (A) NEGATIVE   Ketones, ur NEGATIVE NEGATIVE mg/dL   Protein, ur 100 (A) NEGATIVE mg/dL   Nitrite NEGATIVE NEGATIVE   Leukocytes,Ua NEGATIVE NEGATIVE    Comment: Performed at [facility], [place] [address], [place], [place] [address]  Strep pneumoniae urinary antigen     Status: None   Collection Time: 06/05/22 10:58 PM  Result Value Ref Range   Strep Pneumo Urinary Antigen NEGATIVE NEGATIVE    Comment:        Infection due to S. pneumoniae cannot be absolutely ruled out since the antigen present may be below the  detection limit of the test. Performed at [facility], [address] [place], [place] [address]   Creatinine, urine, random     Status: None   Collection Time: 06/05/22 10:58 PM  Result Value Ref Range   Creatinine, Urine 127 mg/dL    Comment: Performed at [facility], [place] [address] [place], [place] [address]  Osmolality, urine     Status: None   Collection Time: 06/05/22 10:58 PM  Result Value Ref Range   Osmolality, Ur 484 300 - 900 mOsm/kg    Comment: Performed at [facility] [address] [place], [place] [address]  Sodium, urine, random     Status: None   Collection Time: 06/05/22 10:58 PM  Result Value Ref Range   Sodium, Ur <10 mmol/L    Comment: Performed at [facility], [place] [address], [place], [place] [address]  Urinalysis, Microscopic (reflex)     Status: Abnormal   Collection Time: 06/05/22 10:58 PM  Result Value Ref Range   RBC / HPF 0-5 0 - 5 RBC/hpf   WBC, UA 0-5 0 - 5 WBC/hpf   Bacteria, UA FEW (A) NONE SEEN   Squamous Epithelial / HPF 0-5 0 - 5 /HPF   Amorphous Crystal PRESENT     Comment: Performed at [facility], [place] [address] [place], [place] [address]  Lactic acid, plasma     Status: Abnormal   Collection Time: 06/06/22 12:35 AM  Result Value Ref Range   Lactic Acid, Venous 2.6 (HH) 0.5 - 1.9 mmol/L    Comment: CRITICAL RESULT CALLED TO, READ BACK BY AND VERIFIED WITH WATSON,A RN AT 0130 ON 06/06/22 BY VAZQUEZJ Performed at [facility], [place] [address], [place], [place] [address]   HIV Antibody (routine testing w rflx)     Status: None   Collection Time: 06/06/22 12:35 AM  Result Value Ref Range   HIV Screen 4th Generation wRfx Non Reactive Non Reactive    Comment: Performed at [facility], [place] [address], [place], [place] [address]  Procalcitonin - Baseline     Status: None   Collection Time: 06/06/22 12:35 AM  Result Value Ref Range    Procalcitonin 13.04 ng/mL    Comment:        Interpretation: PCT >= 10 ng/mL: Important systemic inflammatory response, almost exclusively due to severe bacterial sepsis or septic shock. (NOTE)       Sepsis PCT Algorithm           Lower Respiratory Tract                                      Infection PCT Algorithm    ----------------------------     ----------------------------  PCT < 0.25 ng/mL                PCT < 0.10 ng/mL          Strongly encourage             Strongly discourage   discontinuation of antibiotics    initiation of antibiotics    ----------------------------     -----------------------------       PCT 0.25 - 0.50 ng/mL            PCT 0.10 - 0.25 ng/mL               OR       >80% decrease in PCT            Discourage initiation of                                            antibiotics      Encourage discontinuation           of antibiotics    ----------------------------     -----------------------------         PCT >= 0.50 ng/mL              PCT 0.26 - 0.50 ng/mL                AND       <80% decrease in PCT             Encourage initiation of                                             antibiotics       Encourage continuation           of antibiotics    ----------------------------     -----------------------------        PCT >= 0.50 ng/mL                  PCT > 0.50 ng/mL               AND         increase in PCT                  Strongly encourage                                      initiation of antibiotics    Strongly encourage escalation           of antibiotics                                     -----------------------------                                           PCT <= 0.25 ng/mL  OR                                        > 80% decrease in PCT                                      Discontinue / Do not initiate                                             antibiotics  Performed at [facility] [address] [place], [place] [address]   Protime-INR     Status: None   Collection Time: 06/06/22 12:35 AM  Result Value Ref Range   Prothrombin Time 14.0 11.4 - 15.2 seconds   INR 1.1 0.8 - 1.2    Comment: (NOTE) INR goal varies based on device and disease states. Performed at [facility], [place] [address], [place],  [address]   APTT     Status: None   Collection Time: 06/06/22 12:35 AM  Result Value Ref Range   aPTT 36 24 - 36 seconds    Comment: Performed at [facility], [place] [address] [place],  [address]  Procalcitonin     Status: None   Collection Time: 06/06/22 12:35 AM  Result Value Ref Range   Procalcitonin 13.06 ng/mL    Comment:        Interpretation: PCT >= 10 ng/mL: Important systemic inflammatory response, almost exclusively due to severe bacterial sepsis or septic shock. (NOTE)       Sepsis PCT Algorithm           Lower Respiratory Tract                                      Infection PCT Algorithm    ----------------------------     ----------------------------         PCT < 0.25 ng/mL                PCT < 0.10 ng/mL          Strongly encourage             Strongly discourage   discontinuation of antibiotics    initiation of antibiotics    ----------------------------     -----------------------------       PCT 0.25 - 0.50 ng/mL            PCT 0.10 - 0.25 ng/mL               OR       >80% decrease in PCT            Discourage initiation of                                            antibiotics      Encourage discontinuation           of antibiotics    ----------------------------     -----------------------------  PCT >= 0.50 ng/mL              PCT 0.26 - 0.50 ng/mL                AND       <80% decrease in PCT             Encourage initiation of                                             antibiotics       Encourage continuation           of antibiotics     ----------------------------     -----------------------------        PCT >= 0.50 ng/mL                  PCT > 0.50 ng/mL               AND         increase in PCT                  Strongly encourage                                      initiation of antibiotics    Strongly encourage escalation           of antibiotics                                     -----------------------------                                           PCT <= 0.25 ng/mL                                                 OR                                        > 80% decrease in PCT                                      Discontinue / Do not initiate                                             antibiotics  Performed at [facility] [address] [place], [place] [address]   CBC with Differential     Status: Abnormal   Collection Time: 06/06/22 12:35 AM  Result Value Ref Range   WBC 0.4 (LL) 4.0 - 10.5 K/uL    Comment: REPEATED TO VERIFY WHITE COUNT CONFIRMED ON SMEAR THIS CRITICAL RESULT HAS VERIFIED AND  BEEN CALLED TO Precious Reel ,RN BY KATIE DAVIS ON 02 21 2024 AT 0141, AND HAS BEEN READ BACK.     RBC 2.66 (L) 4.22 - 5.81 MIL/uL   Hemoglobin 8.3 (L) 13.0 - 17.0 g/dL   HCT 24.5 (L) 39.0 - 52.0 %   MCV 92.1 80.0 - 100.0 fL   MCH 31.2 26.0 - 34.0 pg   MCHC 33.9 30.0 - 36.0 g/dL   RDW 14.8 11.5 - 15.5 %   Platelets 47 (L) 150 - 400 K/uL    Comment: SPECIMEN CHECKED FOR CLOTS Immature Platelet Fraction may be clinically indicated, consider ordering this additional test [id number]    nRBC 0.0 0.0 - 0.2 %   Neutrophils Relative % 89 %   Neutro Abs 0.3 (LL) 1.7 - 7.7 K/uL    Comment: REPEATED TO VERIFY THIS CRITICAL RESULT HAS VERIFIED AND BEEN CALLED TO M. WATSON, RN BY KATIE DAVIS ON 02 21 2024 AT 0141, AND HAS BEEN READ BACK.     Lymphocytes Relative 8 %   Lymphs Abs 0.0 (L) 0.7 - 4.0 K/uL   Monocytes Relative 3 %   Monocytes Absolute 0.0 (L) 0.1 - 1.0 K/uL   Eosinophils Relative 0 %    Eosinophils Absolute 0.0 0.0 - 0.5 K/uL   Basophils Relative 0 %   Basophils Absolute 0.0 0.0 - 0.1 K/uL   WBC Morphology TOXIC GRANULATION    Immature Granulocytes 0 %   Abs Immature Granulocytes 0.00 0.00 - 0.07 K/uL   Ovalocytes PRESENT     Comment: Performed at [facility], [place] [address] [place], [place] [address]  CK     Status: Abnormal   Collection Time: 06/06/22 12:35 AM  Result Value Ref Range   Total CK 764 (H) 49 - 397 U/L    Comment: Performed at [facility], [place] [address] [place], [place] [address]  Osmolality     Status: None   Collection Time: 06/06/22 12:35 AM  Result Value Ref Range   Osmolality 294 275 - 295 mOsm/kg    Comment: Performed at [facility] [address] [place], [place] [address]  Phosphorus     Status: None   Collection Time: 06/06/22 12:35 AM  Result Value Ref Range   Phosphorus 2.9 2.5 - 4.6 mg/dL    Comment: Performed at [facility], [place] [address], [place], [place] [address]  Hepatic function panel     Status: Abnormal   Collection Time: 06/06/22 12:35 AM  Result Value Ref Range   Total Protein 4.7 (L) 6.5 - 8.1 g/dL   Albumin 2.0 (L) 3.5 - 5.0 g/dL   AST 119 (H) 15 - 41 U/L   ALT 57 (H) 0 - 44 U/L   Alkaline Phosphatase 78 38 - 126 U/L   Total Bilirubin 1.0 0.3 - 1.2 mg/dL   Bilirubin, Direct 0.6 (H) 0.0 - 0.2 mg/dL   Indirect Bilirubin 0.4 0.3 - 0.9 mg/dL    Comment: Performed at [facility], [place] [address], [place], [place] [address]  Prealbumin     Status: Abnormal   Collection Time: 06/06/22 12:35 AM  Result Value Ref Range   Prealbumin <5 (L) 18 - 38 mg/dL    Comment: Performed at [facility] [address] [place], [place] [address]  Ammonia     Status: None   Collection Time: 06/06/22 12:35 AM  Result Value Ref Range   Ammonia 11  9 - 35 umol/L    Comment: Performed at [facility], [place] [address],  [place], [place] [address]  Basic metabolic panel     Status: Abnormal   Collection Time: 06/06/22 12:35 AM  Result Value Ref Range   Sodium 131 (L) 135 - 145 mmol/L   Potassium 3.6 3.5 - 5.1 mmol/L   Chloride 96 (L) 98 - 111 mmol/L   CO2 23 22 - 32 mmol/L   Glucose, Bld 119 (H) 70 - 99 mg/dL    Comment: Glucose reference range applies only to samples taken after fasting for at least 8 hours.   BUN 52 (H) 8 - 23 mg/dL   Creatinine, Ser 1.79 (H) 0.61 - 1.24 mg/dL   Calcium 8.1 (L) 8.9 - 10.3 mg/dL   GFR, Estimated 40 (L) >60 mL/min    Comment: (NOTE) Calculated using the CKD-EPI Creatinine Equation (2021)    Anion gap 12 5 - 15    Comment: Performed at [facility], [place] [address], [place], [place] [address]  Type and screen [place]     Status: None   Collection Time: 06/06/22 12:35 AM  Result Value Ref Range   ABO/RH(D) O POS    Antibody Screen NEG    Sample Expiration      06/09/2022,2359 Performed at [facility], [place] [address], [place], [place] [address]   Reticulocytes     Status: Abnormal   Collection Time: 06/06/22 12:35 AM  Result Value Ref Range   Retic Ct Pct <0.4 (L) 0.4 - 3.1 %   RBC. 2.64 (L) 4.22 - 5.81 MIL/uL   Retic Count, Absolute Not Measured 19.0 - 186.0 K/uL    Comment: REPEATED TO VERIFY   Immature Retic Fract 4.1 2.3 - 15.9 %    Comment: Performed at [facility], [place] [address] [place], [place] [address]  C Difficile Quick Screen w PCR reflex     Status: Abnormal   Collection Time: 06/06/22 12:35 AM   Specimen: STOOL  Result Value Ref Range   C Diff antigen POSITIVE (A) NEGATIVE   C Diff toxin NEGATIVE NEGATIVE   C Diff interpretation Results are indeterminate. See PCR results.     Comment: Performed at [facility], [place] [address], [place], [place] [address]  MRSA Next Gen by PCR, Nasal     Status: None   Collection Time: 06/06/22 12:35 AM  Result Value  Ref Range   MRSA by PCR Next Gen NOT DETECTED NOT DETECTED    Comment: (NOTE) The GeneXpert MRSA Assay (FDA approved for NASAL specimens only), is one component of a comprehensive MRSA colonization surveillance program. It is not intended to diagnose MRSA infection nor to guide or monitor treatment for MRSA infections. Test performance is not FDA approved in patients less than 61 years old. Performed at [facility], [place] [address] [place], [place] [address]   C. Diff by PCR, Reflexed     Status: None   Collection Time: 06/06/22 12:35 AM  Result Value Ref Range   Toxigenic C. Difficile by PCR NEGATIVE NEGATIVE    Comment: Patient is colonized with non toxigenic C. difficile. May not need treatment unless significant symptoms are present. Performed at [facility], [place] [address] [place], [place] [address]   Blood gas, venous     Status: Abnormal   Collection Time: 06/06/22 12:38 AM  Result Value Ref Range  pH, Ven 7.39 7.25 - 7.43   pCO2, Ven 43 (L) 44 - 60 mmHg   pO2, Ven <31 (LL) 32 - 45 mmHg    Comment: CRITICAL RESULT CALLED TO, READ BACK BY AND VERIFIED WITH: HALL,K @0113  ON [id number] BY MAHMOUD,S    Bicarbonate 26.0 20.0 - 28.0 mmol/L   Acid-Base Excess 0.8 0.0 - 2.0 mmol/L   O2 Saturation 18.4 %   Patient temperature 37.0     Comment: Performed at [facility], [place] [address], [place], [place] [address]  Glucose, capillary     Status: None   Collection Time: 06/06/22  3:24 AM  Result Value Ref Range   Glucose-Capillary 88 70 - 99 mg/dL    Comment: Glucose reference range applies only to samples taken after fasting for at least 8 hours.  Comprehensive metabolic panel     Status: Abnormal   Collection Time: 06/06/22  6:03 AM  Result Value Ref Range   Sodium 131 (L) 135 - 145 mmol/L   Potassium 3.0 (L) 3.5 - 5.1 mmol/L   Chloride 99 98 - 111 mmol/L   CO2 22 22 - 32 mmol/L   Glucose, Bld 109 (H) 70 - 99 mg/dL    Comment:  Glucose reference range applies only to samples taken after fasting for at least 8 hours.   BUN 50 (H) 8 - 23 mg/dL   Creatinine, Ser 1.67 (H) 0.61 - 1.24 mg/dL   Calcium 7.9 (L) 8.9 - 10.3 mg/dL   Total Protein 4.5 (L) 6.5 - 8.1 g/dL   Albumin 2.0 (L) 3.5 - 5.0 g/dL   AST 107 (H) 15 - 41 U/L   ALT 52 (H) 0 - 44 U/L   Alkaline Phosphatase 70 38 - 126 U/L   Total Bilirubin 1.0 0.3 - 1.2 mg/dL   GFR, Estimated 43 (L) >60 mL/min    Comment: (NOTE) Calculated using the CKD-EPI Creatinine Equation (2021)    Anion gap 10 5 - 15    Comment: Performed at [facility], [place] [address], [place], [place] [address]  CBC     Status: Abnormal   Collection Time: 06/06/22  6:03 AM  Result Value Ref Range   WBC 1.0 (LL) 4.0 - 10.5 K/uL    Comment: This critical result has verified and been called to [facility], W. RN by [facility] on 02 21 2024 at 0708, and has been read back. Repeated to Verify   RBC 2.50 (L) 4.22 - 5.81 MIL/uL   Hemoglobin 7.9 (L) 13.0 - 17.0 g/dL   HCT 24.0 (L) 39.0 - 52.0 %   MCV 96.0 80.0 - 100.0 fL   MCH 31.6 26.0 - 34.0 pg   MCHC 32.9 30.0 - 36.0 g/dL   RDW 15.0 11.5 - 15.5 %   Platelets 38 (L) 150 - 400 K/uL    Comment: SPECIMEN CHECKED FOR CLOTS Immature Platelet Fraction may be clinically indicated, consider ordering this additional test [id number] CONSISTENT WITH PREVIOUS RESULT REPEATED TO VERIFY    nRBC 0.0 0.0 - 0.2 %    Comment: Performed at [facility], [place] [address] [place], [place] [address]  Magnesium     Status: None   Collection Time: 06/06/22  6:03 AM  Result Value Ref Range   Magnesium 1.7 1.7 - 2.4 mg/dL    Comment: Performed at [facility], [place] [address], [place], [place] [address]  Phosphorus     Status: None   Collection  Time: 06/06/22  6:03 AM  Result Value Ref Range   Phosphorus 2.8 2.5 - 4.6 mg/dL    Comment: Performed at [facility], [place] [address],  [place], [place] [address]  Glucose, capillary     Status: None   Collection Time: 06/06/22  8:06 AM  Result Value Ref Range   Glucose-Capillary 81 70 - 99 mg/dL    Comment: Glucose reference range applies only to samples taken after fasting for at least 8 hours.   Comment 1 Notify RN    Comment 2 Document in Chart       Component Value Date/Time   SDES  06/05/2022 1925    BLOOD RIGHT FOREARM Performed at [facility], [place] [address], [place], [place] [address]    [place]  06/05/2022 1925    BOTTLES DRAWN AEROBIC AND ANAEROBIC Blood Culture results may not be optimal due to an excessive volume of blood received in culture bottles Performed at [facility], [place] [address], [place], [place] [address]    CULT  06/05/2022 1925    NO GROWTH < 12 HOURS Performed at [facility] [address], [place],  [address]    REPTSTATUS PENDING 06/05/2022 1925   DG Chest Port 1 View  Result Date: 06/05/2022 CLINICAL DATA:  Per chart: Pt arrived via EMS, weakness and lethargic since saturday, poor PO intake. Lung cancer on the left side, no nausea, diarrhea is typical for him, had radiation today. EXAM: PORTABLE CHEST - 1 VIEW COMPARISON:  01/29/2020 FINDINGS: Suspect patchy right infrahilar airspace disease. Post partial left pneumonectomy. Heart size and mediastinal contours are within normal limits. No effusion. Right acromioclavicular spurring. IMPRESSION: Patchy right infrahilar airspace disease, new since previous Electronically Signed   By: Lucrezia Europe M.D.   On: 06/05/2022 18:37   Recent Results (from the past 240 hour(s))  Resp panel by RT-PCR (RSV, Flu A&B, Covid) Anterior Nasal Swab     Status: None   Collection Time: 06/05/22  6:20 PM   Specimen: Anterior Nasal Swab  Result Value Ref Range Status   SARS Coronavirus 2 by RT PCR NEGATIVE NEGATIVE Final    Comment: (NOTE) SARS-CoV-2 target nucleic acids are NOT DETECTED.  The SARS-CoV-2 RNA is  generally detectable in upper respiratory specimens during the acute phase of infection. The lowest concentration of SARS-CoV-2 viral copies this assay can detect is 138 copies/mL. A negative result does not preclude SARS-Cov-2 infection and should not be used as the sole basis for treatment or other patient management decisions. A negative result may occur with  improper specimen collection/handling, submission of specimen other than nasopharyngeal swab, presence of viral mutation(s) within the areas targeted by this assay, and inadequate number of viral copies(<138 copies/mL). A negative result must be combined with clinical observations, patient history, and epidemiological information. The expected result is Negative.  Fact Sheet for Patients:  EntrepreneurPulse.com.au  Fact Sheet for Healthcare Providers:  IncredibleEmployment.be  This test is no t yet approved or cleared by the [place] FDA and  has been authorized for detection and/or diagnosis of SARS-CoV-2 by FDA under an Emergency Use Authorization (EUA). This EUA will remain  in effect (meaning this test can be used) for the duration of the COVID-19 declaration under Section 564(b)(1) of the Act, 21 U.S.C.section 360bbb-3(b)(1), unless the authorization is terminated  or revoked sooner.       Influenza A by PCR NEGATIVE NEGATIVE Final   Influenza B by  PCR NEGATIVE NEGATIVE Final    Comment: (NOTE) The Xpert Xpress SARS-CoV-2/FLU/RSV plus assay is intended as an aid in the diagnosis of influenza from Nasopharyngeal swab specimens and should not be used as a sole basis for treatment. Nasal washings and aspirates are unacceptable for Xpert Xpress SARS-CoV-2/FLU/RSV testing.  Fact Sheet for Patients: EntrepreneurPulse.com.au  Fact Sheet for Healthcare Providers: IncredibleEmployment.be  This test is not yet approved or cleared by the [place] FDA and has been authorized for detection and/or diagnosis of SARS-CoV-2 by FDA under an Emergency Use Authorization (EUA). This EUA will remain in effect (meaning this test can be used) for the duration of the COVID-19 declaration under Section 564(b)(1) of the Act, 21 U.S.C. section 360bbb-3(b)(1), unless the authorization is terminated or revoked.     Resp Syncytial Virus by PCR NEGATIVE NEGATIVE Final    Comment: (NOTE) Fact Sheet for Patients: EntrepreneurPulse.com.au  Fact Sheet for Healthcare Providers: IncredibleEmployment.be  This test is not yet approved or cleared by the [place] FDA and has been authorized for detection and/or diagnosis of SARS-CoV-2 by FDA under an Emergency Use Authorization (EUA). This EUA will remain in effect (meaning this test can be used) for the duration of the COVID-19 declaration under Section 564(b)(1) of the Act, 21 U.S.C. section 360bbb-3(b)(1), unless the authorization is terminated or revoked.  Performed at [facility], [place] [address], [place], [place] [address]   Blood culture (routine x 2)     Status: None (Preliminary result)   Collection Time: 06/05/22  6:46 PM   Specimen: BLOOD  Result Value Ref Range Status   Specimen Description   Final    BLOOD BLOOD RIGHT ARM Performed at [facility] [address], [place], [place] [address]    Special Requests   Final    BOTTLES DRAWN AEROBIC AND ANAEROBIC Blood Culture adequate volume Performed at [facility] [address], [place], [place] [address]    Culture  Setup Time   Final    GRAM NEGATIVE RODS IN BOTH AEROBIC AND ANAEROBIC BOTTLES CRITICAL RESULT CALLED TO, READ BACK BY AND VERIFIED WITH: PHARMD M.BELL AT 1019 ON 06/06/2022 BY T.SAAD. Performed at [facility], [place] [address] [place], [place] [address]    Culture GRAM NEGATIVE RODS  Final   Report Status  PENDING  Incomplete  Blood Culture ID Panel (Reflexed)     Status: Abnormal   Collection Time: 06/05/22  6:46 PM  Result Value Ref Range Status   Enterococcus faecalis NOT DETECTED NOT DETECTED Final   Enterococcus Faecium NOT DETECTED NOT DETECTED Final   Listeria monocytogenes NOT DETECTED NOT DETECTED Final   Staphylococcus species NOT DETECTED NOT DETECTED Final   Staphylococcus aureus (BCID) NOT DETECTED NOT DETECTED Final   Staphylococcus epidermidis NOT DETECTED NOT DETECTED Final   Staphylococcus lugdunensis NOT DETECTED NOT DETECTED Final   Streptococcus species NOT DETECTED NOT DETECTED Final   Streptococcus agalactiae NOT DETECTED NOT DETECTED Final   Streptococcus pneumoniae NOT DETECTED NOT DETECTED Final   Streptococcus pyogenes NOT DETECTED NOT DETECTED Final   A.calcoaceticus-baumannii NOT DETECTED NOT DETECTED Final   Bacteroides fragilis NOT DETECTED NOT DETECTED Final   Enterobacterales DETECTED (A) NOT DETECTED Final    Comment: Enterobacterales represent a large order of gram negative bacteria, not a single organism. CRITICAL RESULT CALLED TO, READ BACK BY AND VERIFIED WITH: PHARMD M.BELL AT 1019 ON 06/06/2022 BY T.SAAD.    Enterobacter cloacae complex NOT  DETECTED NOT DETECTED Final   Escherichia coli NOT DETECTED NOT DETECTED Final   Klebsiella aerogenes NOT DETECTED NOT DETECTED Final   Klebsiella oxytoca NOT DETECTED NOT DETECTED Final   Klebsiella pneumoniae DETECTED (A) NOT DETECTED Final    Comment: CRITICAL RESULT CALLED TO, READ BACK BY AND VERIFIED WITH: PHARMD M.BELL AT 1019 ON 06/06/2022 BY T.SAAD.    Proteus species NOT DETECTED NOT DETECTED Final   Salmonella species NOT DETECTED NOT DETECTED Final   Serratia marcescens NOT DETECTED NOT DETECTED Final   Haemophilus influenzae NOT DETECTED NOT DETECTED Final   Neisseria meningitidis NOT DETECTED NOT DETECTED Final   Pseudomonas aeruginosa NOT DETECTED NOT DETECTED Final   Stenotrophomonas  maltophilia NOT DETECTED NOT DETECTED Final   Candida albicans NOT DETECTED NOT DETECTED Final   Candida auris NOT DETECTED NOT DETECTED Final   Candida glabrata NOT DETECTED NOT DETECTED Final   Candida krusei NOT DETECTED NOT DETECTED Final   Candida parapsilosis NOT DETECTED NOT DETECTED Final   Candida tropicalis NOT DETECTED NOT DETECTED Final   Cryptococcus neoformans/gattii NOT DETECTED NOT DETECTED Final   CTX-M ESBL NOT DETECTED NOT DETECTED Final   Carbapenem resistance IMP NOT DETECTED NOT DETECTED Final   Carbapenem resistance KPC NOT DETECTED NOT DETECTED Final   Carbapenem resistance NDM NOT DETECTED NOT DETECTED Final   Carbapenem resist OXA 48 LIKE NOT DETECTED NOT DETECTED Final   Carbapenem resistance VIM NOT DETECTED NOT DETECTED Final    Comment: Performed at [facility], [address], [place], [place] [address]  Blood culture (routine x 2)     Status: None (Preliminary result)   Collection Time: 06/05/22  7:25 PM   Specimen: BLOOD RIGHT FOREARM  Result Value Ref Range Status   Specimen Description   Final    BLOOD RIGHT FOREARM Performed at [facility], [place] [address], [place], [place] [address]    Special Requests   Final    BOTTLES DRAWN AEROBIC AND ANAEROBIC Blood Culture results may not be optimal due to an excessive volume of blood received in culture bottles Performed at [facility] [address] [place], [place] [address]    Culture   Final    NO GROWTH < 12 HOURS Performed at [facility] [address] [place], [place] [address]    Report Status PENDING  Incomplete  C Difficile Quick Screen w PCR reflex     Status: Abnormal   Collection Time: 06/06/22 12:35 AM   Specimen: STOOL  Result Value Ref Range Status   C Diff antigen POSITIVE (A) NEGATIVE Final   C Diff toxin NEGATIVE NEGATIVE Final   C Diff interpretation Results are indeterminate. See PCR results.  Final    Comment: Performed at [facility], [place] [address] [place], [place] [address]  MRSA Next Gen by PCR, Nasal     Status: None   Collection Time: 06/06/22 12:35 AM  Result Value Ref Range Status   MRSA by PCR Next Gen NOT DETECTED NOT DETECTED Final    Comment: (NOTE) The GeneXpert MRSA Assay (FDA approved for NASAL specimens only), is one component of a comprehensive MRSA colonization surveillance program. It is not intended to diagnose MRSA infection nor to guide or monitor treatment for MRSA infections. Test performance is not FDA approved in patients less than 70 years old. Performed at [facility], [place] [address], [place], [place] [address]   C. Diff  by PCR, Reflexed     Status: None   Collection Time: 06/06/22 12:35 AM  Result Value Ref Range Status   Toxigenic C. Difficile by PCR NEGATIVE NEGATIVE Final    Comment: Patient is colonized with non toxigenic C. difficile. May not need treatment unless significant symptoms are present. Performed at [facility], [place] [address] [place], [place] [address]     Microbiology: Recent Results (from the past 240 hour(s))  Resp panel by RT-PCR (RSV, Flu A&B, Covid) Anterior Nasal Swab     Status: None   Collection Time: 06/05/22  6:20 PM   Specimen: Anterior Nasal Swab  Result Value Ref Range Status   SARS Coronavirus 2 by RT PCR NEGATIVE NEGATIVE Final    Comment: (NOTE) SARS-CoV-2 target nucleic acids are NOT DETECTED.  The SARS-CoV-2 RNA is generally detectable in upper respiratory specimens during the acute phase of infection. The lowest concentration of SARS-CoV-2 viral copies this assay can detect is 138 copies/mL. A negative result does not preclude SARS-Cov-2 infection and should not be used as the sole basis for treatment or other patient management decisions. A negative result may occur with  improper specimen collection/handling, submission of specimen other than nasopharyngeal swab, presence of viral mutation(s)  within the areas targeted by this assay, and inadequate number of viral copies(<138 copies/mL). A negative result must be combined with clinical observations, patient history, and epidemiological information. The expected result is Negative.  Fact Sheet for Patients:  EntrepreneurPulse.com.au  Fact Sheet for Healthcare Providers:  IncredibleEmployment.be  This test is no t yet approved or cleared by the [place] FDA and  has been authorized for detection and/or diagnosis of SARS-CoV-2 by FDA under an Emergency Use Authorization (EUA). This EUA will remain  in effect (meaning this test can be used) for the duration of the COVID-19 declaration under Section 564(b)(1) of the Act, 21 U.S.C.section 360bbb-3(b)(1), unless the authorization is terminated  or revoked sooner.       Influenza A by PCR NEGATIVE NEGATIVE Final   Influenza B by PCR NEGATIVE NEGATIVE Final    Comment: (NOTE) The Xpert Xpress SARS-CoV-2/FLU/RSV plus assay is intended as an aid in the diagnosis of influenza from Nasopharyngeal swab specimens and should not be used as a sole basis for treatment. Nasal washings and aspirates are unacceptable for Xpert Xpress SARS-CoV-2/FLU/RSV testing.  Fact Sheet for Patients: EntrepreneurPulse.com.au  Fact Sheet for Healthcare Providers: IncredibleEmployment.be  This test is not yet approved or cleared by the [place] FDA and has been authorized for detection and/or diagnosis of SARS-CoV-2 by FDA under an Emergency Use Authorization (EUA). This EUA will remain in effect (meaning this test can be used) for the duration of the COVID-19 declaration under Section 564(b)(1) of the Act, 21 U.S.C. section 360bbb-3(b)(1), unless the authorization is terminated or revoked.     Resp Syncytial Virus by PCR NEGATIVE NEGATIVE Final    Comment: (NOTE) Fact Sheet for  Patients: EntrepreneurPulse.com.au  Fact Sheet for Healthcare Providers: IncredibleEmployment.be  This test is not yet approved or cleared by the [place] FDA and has been authorized for detection and/or diagnosis of SARS-CoV-2 by FDA under an Emergency Use Authorization (EUA). This EUA will remain in effect (meaning this test can be used) for the duration of the COVID-19 declaration under Section 564(b)(1) of the Act, 21 U.S.C. section 360bbb-3(b)(1), unless the authorization is terminated or revoked.  Performed at [facility], [place] [address] [place], [place] [address]  Blood culture (routine x 2)     Status: None (Preliminary result)   Collection Time: 06/05/22  6:46 PM   Specimen: BLOOD  Result Value Ref Range Status   Specimen Description   Final    BLOOD BLOOD RIGHT ARM Performed at [facility] [address], [place], [place] [address]    Special Requests   Final    BOTTLES DRAWN AEROBIC AND ANAEROBIC Blood Culture adequate volume Performed at [facility] [address], [place], [place] [address]    Culture  Setup Time   Final    GRAM NEGATIVE RODS IN BOTH AEROBIC AND ANAEROBIC BOTTLES CRITICAL RESULT CALLED TO, READ BACK BY AND VERIFIED WITH: PHARMD M.BELL AT 1019 ON 06/06/2022 BY T.SAAD. Performed at [facility], [place] [address] [place],  [address]    Culture GRAM NEGATIVE RODS  Final   Report Status PENDING  Incomplete  Blood Culture ID Panel (Reflexed)     Status: Abnormal   Collection Time: 06/05/22  6:46 PM  Result Value Ref Range Status   Enterococcus faecalis NOT DETECTED NOT DETECTED Final   Enterococcus Faecium NOT DETECTED NOT DETECTED Final   Listeria monocytogenes NOT DETECTED NOT DETECTED Final   Staphylococcus species NOT DETECTED NOT DETECTED Final   Staphylococcus aureus (BCID) NOT DETECTED NOT DETECTED Final   Staphylococcus  epidermidis NOT DETECTED NOT DETECTED Final   Staphylococcus lugdunensis NOT DETECTED NOT DETECTED Final   Streptococcus species NOT DETECTED NOT DETECTED Final   Streptococcus agalactiae NOT DETECTED NOT DETECTED Final   Streptococcus pneumoniae NOT DETECTED NOT DETECTED Final   Streptococcus pyogenes NOT DETECTED NOT DETECTED Final   A.calcoaceticus-baumannii NOT DETECTED NOT DETECTED Final   Bacteroides fragilis NOT DETECTED NOT DETECTED Final   Enterobacterales DETECTED (A) NOT DETECTED Final    Comment: Enterobacterales represent a large order of gram negative bacteria, not a single organism. CRITICAL RESULT CALLED TO, READ BACK BY AND VERIFIED WITH: PHARMD M.BELL AT 1019 ON 06/06/2022 BY T.SAAD.    Enterobacter cloacae complex NOT DETECTED NOT DETECTED Final   Escherichia coli NOT DETECTED NOT DETECTED Final   Klebsiella aerogenes NOT DETECTED NOT DETECTED Final   Klebsiella oxytoca NOT DETECTED NOT DETECTED Final   Klebsiella pneumoniae DETECTED (A) NOT DETECTED Final    Comment: CRITICAL RESULT CALLED TO, READ BACK BY AND VERIFIED WITH: PHARMD M.BELL AT 1019 ON 06/06/2022 BY T.SAAD.    Proteus species NOT DETECTED NOT DETECTED Final   Salmonella species NOT DETECTED NOT DETECTED Final   Serratia marcescens NOT DETECTED NOT DETECTED Final   Haemophilus influenzae NOT DETECTED NOT DETECTED Final   Neisseria meningitidis NOT DETECTED NOT DETECTED Final   Pseudomonas aeruginosa NOT DETECTED NOT DETECTED Final   Stenotrophomonas maltophilia NOT DETECTED NOT DETECTED Final   Candida albicans NOT DETECTED NOT DETECTED Final   Candida auris NOT DETECTED NOT DETECTED Final   Candida glabrata NOT DETECTED NOT DETECTED Final   Candida krusei NOT DETECTED NOT DETECTED Final   Candida parapsilosis NOT DETECTED NOT DETECTED Final   Candida tropicalis NOT DETECTED NOT DETECTED Final   Cryptococcus neoformans/gattii NOT DETECTED NOT DETECTED Final   CTX-M ESBL NOT DETECTED NOT DETECTED  Final   Carbapenem resistance IMP NOT DETECTED NOT DETECTED Final   Carbapenem resistance KPC NOT DETECTED NOT DETECTED Final   Carbapenem resistance NDM NOT DETECTED NOT DETECTED Final   Carbapenem resist OXA 48 LIKE NOT DETECTED NOT DETECTED Final   Carbapenem resistance VIM  NOT DETECTED NOT DETECTED Final    Comment: Performed at [facility], [place] [address], [place], [place] [address]  Blood culture (routine x 2)     Status: None (Preliminary result)   Collection Time: 06/05/22  7:25 PM   Specimen: BLOOD RIGHT FOREARM  Result Value Ref Range Status   Specimen Description   Final    BLOOD RIGHT FOREARM Performed at [facility], [place] [address], [place], [place] [address]    Special Requests   Final    BOTTLES DRAWN AEROBIC AND ANAEROBIC Blood Culture results may not be optimal due to an excessive volume of blood received in culture bottles Performed at [facility] [address] [place], [place] [address]    Culture   Final    NO GROWTH < 12 HOURS Performed at [facility] [address] [place], [place] [address]    Report Status PENDING  Incomplete  C Difficile Quick Screen w PCR reflex     Status: Abnormal   Collection Time: 06/06/22 12:35 AM   Specimen: STOOL  Result Value Ref Range Status   C Diff antigen POSITIVE (A) NEGATIVE Final   C Diff toxin NEGATIVE NEGATIVE Final   C Diff interpretation Results are indeterminate. See PCR results.  Final    Comment: Performed at [facility], [place] [address] [place], [place] [address]  MRSA Next Gen by PCR, Nasal     Status: None   Collection Time: 06/06/22 12:35 AM  Result Value Ref Range Status   MRSA by PCR Next Gen NOT DETECTED NOT DETECTED Final    Comment: (NOTE) The GeneXpert MRSA Assay (FDA approved for NASAL specimens only), is one component of a comprehensive MRSA colonization surveillance program. It is not intended to diagnose MRSA infection nor to guide or  monitor treatment for MRSA infections. Test performance is not FDA approved in patients less than 23 years old. Performed at [facility], [place] [address], [place], [place] [address]   C. Diff by PCR, Reflexed     Status: None   Collection Time: 06/06/22 12:35 AM  Result Value Ref Range Status   Toxigenic C. Difficile by PCR NEGATIVE NEGATIVE Final    Comment: Patient is colonized with non toxigenic C. difficile. May not need treatment unless significant symptoms are present. Performed at [facility], [place] [address] [place], [place] [address]     Radiographs and labs were personally reviewed by me.   Bobby Rumpf, MD [facility] [phone] 06/06/2022, 10:48 AM

## 2022-06-06 NOTE — Evaluation (Signed)
SLP Cancellation Note  Patient Details Name: Jesse [name] MRN: [id number] DOB: 14-Apr-1951   Cancelled treatment:       Reason Eval/Treat Not Completed: Other (comment);Fatigue/lethargy limiting ability to participate (pt with low BP and lethargic per notes, will continue efforts)  Kathleen Lime, MS [facility] SLP Acute Rehab Services Office [phone]  [name] 06/06/2022, 12:18 PM

## 2022-06-06 NOTE — Progress Notes (Signed)
DIAGNOSIS:  1) recurrent non-small cell lung cancer, adenocarcinoma with positive mediastinal lymph node in December 2023 that was initially diagnosed as stage IIB (T1 a, N1, M0) non-small cell lung cancer, adenocarcinoma diagnosed in June 2021. 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   Biomarker Findings Tumor Mutational Burden - 20 Muts/Mb Microsatellite status - MS-Stable Genomic Findings For a complete list of the genes assayed, please refer to the Appendix. KEAP1 I476fs*17 QJJH4R D408* RB1 splice site 1448+1E>H UD14 R209* 8 Disease relevant genes with no reportable alterations: ALK, BRAF, EGFR, ERBB2, KRAS, MET, RET, ROS1   PDL1 Expression: 1 %   PRIOR THERAPY:  1) status post left upper lobectomy with lymph node dissection on December 16, 2019 under the care of Dr. Kipp Brood.  The tumor size measured 0.9 cm but there was involvement of the level 10 L and 11 L.  2) Adjuvant systemic chemotherapy with cisplatin 75 mg/M2 and Alimta 500 mg/M2 every 3 weeks.  First dose February 04, 2020.  Status post 4 cycles. 3) Neoadjuvant treatment with imatinib 400 mg p.o. daily for the GIST tumor of the pelvic area.  First dose started July 18, 2020.  Status post 12 months of treatment. 4) status post robotic assisted very low anterior rectosigmoid resection with coloanal anastomosis, diverting loop ileostomy and wedge liver biopsy under the care of Dr. Johney Maine on December 14, 2021 and it showed minimal residual gastrointestinal stromal tumor.   CURRENT THERAPY:  1) concurrent chemoradiation with weekly carboplatin for AUC of 2 and paclitaxel 45 Mg/M2.  First dose May 14, 2022.  Status post 3 cycles 2) Imatinib 400 mg p.o. daily  Subjective: The patient is seen and examined today.  He is feeling fatigue and tired.  He was seen in the office 2 days ago before starting cycle #4 of his treatment.  He was complaining of increasing fatigue and weakness as well as poor p.o. intake and low oxygen  saturation.  He was hypotensive and pancytopenic. The patient was sent to the emergency department for further evaluation.  Initial workup showed suspicious pneumonia of the right lung.  And the blood culture showed Klebsiella pneumonia bacteremia with neutropenic fever and diarrhea.  The patient is currently on treatment with Rocephin and vancomycin.  He denied having any current fever or chills.  He has no chest pain but continues to have shortness of breath.  His absolute neutrophil count today was 300.  Objective: Vital signs in last 24 hours: Temp:  [97.8 F (36.6 C)-102.9 F (39.4 C)] 98 F (36.7 C) (02/21 1600) Pulse Rate:  [51-131] 110 (02/21 1600) Resp:  [12-24] 22 (02/21 1600) BP: (69-136)/(44-117) 112/71 (02/21 1600) SpO2:  [89 %-100 %] 94 % (02/21 1600) Weight:  [144 lb 13.5 oz (65.7 kg)] 144 lb 13.5 oz (65.7 kg) (02/21 0122)  Intake/Output from previous day: 02/20 0701 - 02/21 0700 In: 6538.3 [I.V.:3223.8; IV Piggyback:3314.5] Out: 358 [Urine:358] Intake/Output this shift: Total I/O In: 2106.2 [I.V.:1506.2; IV Piggyback:600] Out: -   General appearance: alert, cooperative, fatigued, and no distress Resp: rales RLL Cardio: regular rate and rhythm, S1, S2 normal, no murmur, click, rub or gallop, normal apical impulse, and prominent apical impulse GI: soft, non-tender; bowel sounds normal; no masses,  no organomegaly Extremities: extremities normal, atraumatic, no cyanosis or edema  Lab Results:  Recent Labs    06/06/22 0035 06/06/22 0603  WBC 0.4* 1.0*  HGB 8.3* 7.9*  HCT 24.5* 24.0*  PLT 47* 38*   BMET Recent Labs  06/06/22 0035 06/06/22 0603  NA 131* 131*  K 3.6 3.0*  CL 96* 99  CO2 23 22  GLUCOSE 119* 109*  BUN 52* 50*  CREATININE 1.79* 1.67*  CALCIUM 8.1* 7.9*    Studies/Results: DG Chest Port 1 View  Result Date: 06/05/2022 CLINICAL DATA:  Per chart: Pt arrived via EMS, weakness and lethargic since saturday, poor PO intake. Lung cancer on the  left side, no nausea, diarrhea is typical for him, had radiation today. EXAM: PORTABLE CHEST - 1 VIEW COMPARISON:  01/29/2020 FINDINGS: Suspect patchy right infrahilar airspace disease. Post partial left pneumonectomy. Heart size and mediastinal contours are within normal limits. No effusion. Right acromioclavicular spurring. IMPRESSION: Patchy right infrahilar airspace disease, new since previous Electronically Signed   By: Lucrezia Europe M.D.   On: 06/05/2022 18:37    Medications: I have reviewed the patient's current medications.  Assessment/Plan: This is a very pleasant 72 years old white male with recurrent non-small cell lung cancer, adenocarcinoma that was initially diagnosed as stage IIb in June 2021 and the patient had read disease recurrence and December 2023 with mediastinal lymphadenopathy.  He also has a history of GIST of the pelvis status post neoadjuvant treatment with Gleevec followed by surgical resection and currently on adjuvant treatment. The patient is currently undergoing a course of concurrent chemoradiation with low-dose chemotherapy with carboplatin for AUC of 2 and paclitaxel 45 Mg/M2 status post 3 cycles.  Has been tolerating this treatment well except for fatigue.  He could not receive cycle #4 2 days ago because of the significant pancytopenia as well as the weakness and fatigue and hypotension. The patient was diagnosed with right lung pneumonia with Klebsiella pneumonia bacteremia.  He is currently on treatment with Rocephin and vancomycin.  He will continue his current treatment for the pneumonia under the care of of the hospitalist as well as infectious disease. For the chemotherapy-induced neutropenia, I will hold his chemotherapy for now including Gleevac.  I will start the patient on Granix 300 mg subcu daily until his absolute neutrophil count is 1500 for at least 2 days. For the chemotherapy induced/sepsis induced anemia, he would benefit from 1 unit of PRBCs transfusion  to keep his hemoglobin above 8. For the thrombocytopenia we will continue to monitor for now and consider The patient for transfusion only if his platelets count less than 20,000. Thank you so much for taking good care of Mr. Dibella, I will continue to follow-up the patient with you and assist in his management on as-needed basis. Disclaimer: This note was dictated with voice recognition software. Similar sounding words can inadvertently be transcribed and may be missed upon review.    LOS: 1 day    Eilleen Kempf 06/06/2022

## 2022-06-06 NOTE — Evaluation (Signed)
SLP Cancellation Note  Patient Details Name: Jesse [name] MRN: [id number] DOB: 1951-03-26   Cancelled treatment:       Reason Eval/Treat Not Completed: Other (comment) (pt getting cleaned up by staff at this time, will continue efforts)  Kathleen Lime, MS [facility] SLP Acute Rehab Services Office [phone]  [name] 06/06/2022, 5:12 PM

## 2022-06-07 ENCOUNTER — Other Ambulatory Visit: Payer: Self-pay

## 2022-06-07 ENCOUNTER — Inpatient Hospital Stay (HOSPITAL_COMMUNITY): Payer: Medicare Other

## 2022-06-07 ENCOUNTER — Ambulatory Visit: Payer: Medicare Other

## 2022-06-07 DIAGNOSIS — D702 Other drug-induced agranulocytosis: Secondary | ICD-10-CM | POA: Diagnosis not present

## 2022-06-07 DIAGNOSIS — R6520 Severe sepsis without septic shock: Secondary | ICD-10-CM | POA: Diagnosis not present

## 2022-06-07 DIAGNOSIS — E876 Hypokalemia: Secondary | ICD-10-CM | POA: Diagnosis not present

## 2022-06-07 DIAGNOSIS — E43 Unspecified severe protein-calorie malnutrition: Secondary | ICD-10-CM | POA: Insufficient documentation

## 2022-06-07 DIAGNOSIS — J150 Pneumonia due to Klebsiella pneumoniae: Secondary | ICD-10-CM | POA: Diagnosis not present

## 2022-06-07 DIAGNOSIS — D701 Agranulocytosis secondary to cancer chemotherapy: Secondary | ICD-10-CM

## 2022-06-07 DIAGNOSIS — A419 Sepsis, unspecified organism: Secondary | ICD-10-CM | POA: Diagnosis not present

## 2022-06-07 DIAGNOSIS — C3492 Malignant neoplasm of unspecified part of left bronchus or lung: Secondary | ICD-10-CM | POA: Diagnosis not present

## 2022-06-07 DIAGNOSIS — J189 Pneumonia, unspecified organism: Secondary | ICD-10-CM | POA: Diagnosis not present

## 2022-06-07 DIAGNOSIS — T451X5A Adverse effect of antineoplastic and immunosuppressive drugs, initial encounter: Secondary | ICD-10-CM

## 2022-06-07 LAB — COMPREHENSIVE METABOLIC PANEL
ALT: 38 U/L (ref 0–44)
AST: 68 U/L — ABNORMAL HIGH (ref 15–41)
Albumin: 2.4 g/dL — ABNORMAL LOW (ref 3.5–5.0)
Alkaline Phosphatase: 64 U/L (ref 38–126)
Anion gap: 16 — ABNORMAL HIGH (ref 5–15)
BUN: 44 mg/dL — ABNORMAL HIGH (ref 8–23)
CO2: 14 mmol/L — ABNORMAL LOW (ref 22–32)
Calcium: 7.7 mg/dL — ABNORMAL LOW (ref 8.9–10.3)
Chloride: 105 mmol/L (ref 98–111)
Creatinine, Ser: 1.17 mg/dL (ref 0.61–1.24)
GFR, Estimated: >60 mL/min (ref >60)
Glucose, Bld: 88 mg/dL (ref 70–99)
Potassium: 3.2 mmol/L — ABNORMAL LOW (ref 3.5–5.1)
Sodium: 135 mmol/L (ref 135–145)
Total Bilirubin: 1.8 mg/dL — ABNORMAL HIGH (ref 0.3–1.2)
Total Protein: 4.5 g/dL — ABNORMAL LOW (ref 6.5–8.1)

## 2022-06-07 LAB — CBC
HCT: 25.1 % — ABNORMAL LOW (ref 39.0–52.0)
Hemoglobin: 8.4 g/dL — ABNORMAL LOW (ref 13.0–17.0)
MCH: 31.2 pg (ref 26.0–34.0)
MCHC: 33.5 g/dL (ref 30.0–36.0)
MCV: 93.3 fL (ref 80.0–100.0)
Platelets: 43 10*3/uL — ABNORMAL LOW (ref 150–400)
RBC: 2.69 MIL/uL — ABNORMAL LOW (ref 4.22–5.81)
RDW: 15.2 % (ref 11.5–15.5)
WBC: 1.9 10*3/uL — ABNORMAL LOW (ref 4.0–10.5)
nRBC: 0 % (ref 0.0–0.2)

## 2022-06-07 LAB — LEGIONELLA PNEUMOPHILA SEROGP 1 UR AG: L. pneumophila Serogp 1 Ur Ag: NEGATIVE

## 2022-06-07 LAB — PROCALCITONIN: Procalcitonin: 5.55 ng/mL

## 2022-06-07 LAB — GLUCOSE, CAPILLARY
Glucose-Capillary: 114 mg/dL — ABNORMAL HIGH (ref 70–99)
Glucose-Capillary: 119 mg/dL — ABNORMAL HIGH (ref 70–99)
Glucose-Capillary: 78 mg/dL (ref 70–99)
Glucose-Capillary: 86 mg/dL (ref 70–99)
Glucose-Capillary: 96 mg/dL (ref 70–99)
Glucose-Capillary: 96 mg/dL (ref 70–99)

## 2022-06-07 LAB — BLOOD GAS, ARTERIAL
Acid-base deficit: 1.4 mmol/L (ref 0.0–2.0)
Bicarbonate: 21.3 mmol/L (ref 20.0–28.0)
Drawn by: 270211
O2 Content: 21 L/min
O2 Saturation: 95 %
Patient temperature: 36.8
pCO2 arterial: 28 mmHg — ABNORMAL LOW (ref 32–48)
pH, Arterial: 7.49 — ABNORMAL HIGH (ref 7.35–7.45)
pO2, Arterial: 61 mmHg — ABNORMAL LOW (ref 83–108)

## 2022-06-07 LAB — URINE CULTURE
Culture: NO GROWTH
Culture: NO GROWTH

## 2022-06-07 LAB — MAGNESIUM
Magnesium: 1.7 mg/dL (ref 1.7–2.4)
Magnesium: 1.8 mg/dL (ref 1.7–2.4)

## 2022-06-07 LAB — PHOSPHORUS
Phosphorus: 1.9 mg/dL — ABNORMAL LOW (ref 2.5–4.6)
Phosphorus: 1.9 mg/dL — ABNORMAL LOW (ref 2.5–4.6)

## 2022-06-07 LAB — AMMONIA: Ammonia: 14 umol/L (ref 9–35)

## 2022-06-07 MED ORDER — ACETAMINOPHEN 160 MG/5ML PO SOLN
650.0000 mg | Freq: Four times a day (QID) | ORAL | Status: DC | PRN
  Administered 2022-06-08 – 2022-06-11 (×2): 650 mg
  Filled 2022-06-07 (×3): qty 20.3

## 2022-06-07 MED ORDER — POTASSIUM CHLORIDE 10 MEQ/100ML IV SOLN
10.0000 meq | INTRAVENOUS | Status: AC
  Administered 2022-06-07 (×3): 10 meq via INTRAVENOUS
  Filled 2022-06-07 (×3): qty 100

## 2022-06-07 MED ORDER — SUCRALFATE 1 GM/10ML PO SUSP
1.0000 g | Freq: Three times a day (TID) | ORAL | Status: DC
  Administered 2022-06-07 – 2022-06-12 (×17): 1 g
  Filled 2022-06-07 (×19): qty 10

## 2022-06-07 MED ORDER — ACETAMINOPHEN 650 MG RE SUPP: 650.0000 mg | Freq: Four times a day (QID) | RECTAL | Status: DC | PRN

## 2022-06-07 MED ORDER — CHOLESTYRAMINE LIGHT 4 G PO PACK
4.0000 g | PACK | Freq: Two times a day (BID) | ORAL | Status: DC
  Administered 2022-06-07 – 2022-06-22 (×29): 4 g
  Filled 2022-06-07 (×29): qty 1

## 2022-06-07 MED ORDER — THIAMINE MONONITRATE 100 MG PO TABS
100.0000 mg | ORAL_TABLET | Freq: Every day | ORAL | Status: AC
  Administered 2022-06-08 – 2022-06-11 (×4): 100 mg
  Filled 2022-06-07 (×5): qty 1

## 2022-06-07 MED ORDER — LOPERAMIDE HCL 2 MG PO CAPS
4.0000 mg | ORAL_CAPSULE | Freq: Once | ORAL | Status: AC
  Administered 2022-06-07: 4 mg via ORAL
  Filled 2022-06-07: qty 2

## 2022-06-07 MED ORDER — LOPERAMIDE HCL 1 MG/7.5ML PO SUSP
2.0000 mg | Freq: Three times a day (TID) | ORAL | Status: DC | PRN
  Administered 2022-06-07: 2 mg
  Filled 2022-06-07 (×4): qty 15

## 2022-06-07 MED ORDER — FUROSEMIDE 10 MG/ML IJ SOLN
40.0000 mg | Freq: Once | INTRAMUSCULAR | Status: AC
  Administered 2022-06-07: 40 mg via INTRAVENOUS
  Filled 2022-06-07: qty 4

## 2022-06-07 MED ORDER — IPRATROPIUM-ALBUTEROL 0.5-2.5 (3) MG/3ML IN SOLN
3.0000 mL | Freq: Four times a day (QID) | RESPIRATORY_TRACT | Status: DC | PRN
  Administered 2022-06-07: 3 mL via RESPIRATORY_TRACT
  Filled 2022-06-07: qty 3

## 2022-06-07 MED ORDER — IPRATROPIUM-ALBUTEROL 0.5-2.5 (3) MG/3ML IN SOLN
3.0000 mL | Freq: Three times a day (TID) | RESPIRATORY_TRACT | Status: DC
  Administered 2022-06-07 – 2022-06-10 (×10): 3 mL via RESPIRATORY_TRACT
  Filled 2022-06-07 (×11): qty 3

## 2022-06-07 MED ORDER — PROSOURCE TF20 ENFIT COMPATIBL EN LIQD
60.0000 mL | Freq: Every day | ENTERAL | Status: DC
  Administered 2022-06-08 – 2022-07-06 (×25): 60 mL
  Filled 2022-06-07 (×29): qty 60

## 2022-06-07 MED ORDER — HYDROCODONE-ACETAMINOPHEN 5-325 MG PO TABS
1.0000 | ORAL_TABLET | ORAL | Status: DC | PRN
  Administered 2022-06-07 – 2022-06-09 (×2): 2
  Administered 2022-06-12: 1
  Administered 2022-06-13: 2
  Administered 2022-06-16 – 2022-06-19 (×4): 1
  Filled 2022-06-07 (×3): qty 2
  Filled 2022-06-07: qty 1
  Filled 2022-06-07: qty 2
  Filled 2022-06-07 (×5): qty 1

## 2022-06-07 MED ORDER — CHOLESTYRAMINE LIGHT 4 G PO PACK
4.0000 g | PACK | Freq: Two times a day (BID) | ORAL | Status: DC
  Filled 2022-06-07: qty 1

## 2022-06-07 MED ORDER — OSMOLITE 1.5 CAL PO LIQD
1000.0000 mL | ORAL | Status: DC
  Administered 2022-06-11 – 2022-06-15 (×7): 1000 mL
  Filled 2022-06-07 (×20): qty 1000

## 2022-06-07 NOTE — Progress Notes (Signed)
[facility]                                                                              Jesse [name], is a 72 y.o. male, DOB - 24-Dec-1950, OZH:[id number] Admit date - 06/05/2022    Outpatient Primary MD for the patient is [name], MD  LOS - 2  days  Chief Complaint  Patient presents with   Weakness       Brief summary   Patient is a 72 year old male with lung CA, currently undergoing chemotherapy, XRT, HTN, HLP, history of bladder cancer, GERD, hypokalemia, COPD presented with severe fatigue and confusion.  Patient was found to be unresponsive on the day of admission, has been generally weak and lethargic with poor p.o. intake.  EMS was called and O2 sats were 97 on room air, however look to cyanotic and patient was placed on 4 L O2.  BP soft in 80s and patient was given normal saline bolus. Per family, had not been acting right for last 4 days prior to admission. Patient was seen at cancer center on 12/19 for chemo appointment and was found to be disoriented and chemo was held.  He was found to have hypokalemia and received IV potassium.  He felt better and went home however started feeling worse again. In ED temp 102 F, heart rate 131, BP 107/72, O2 sats 97% on room air Labs showed sodium 130, potassium 2.6, BUN 53, creatinine 1.85, creatinine was 1.09 on 06/04/2022 CBC showed WBCs 1.1, was 1.9 on 06/04/2022 Flu, RSV, COVID-19 negative Patient also had multiple episodes of diarrhea, C. difficile showed antigen positive but negative toxin  Chest x-ray showed patchy right infrahilar airspace disease, new  Assessment & Plan    Principal Problem: Severe sepsis ([place]), POA likely secondary to right infrahilar pneumonia Neutropenia with fevers Klebsiella pneumonia bacteremia -Patient met criteria with severe sepsis, febrile, tachycardia, hypotensive, AKI, lactic acidosis source likely due to pneumonia -Has received significant IV fluid hydration, cortisol  level 24.6. -Procalcitonin trending down, 13.04 on admission -> 5.5 -Urine strep antigen negative.  Urine Legionella antigen pending. -Blood cultures positive for Klebsiella pneumonia.  C. difficile PCR negative. 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 Johney Maine on 05/22/2022.  C. difficile antigen positive however negative toxin.  GI pathogen panel negative. -Started on loperamide, will add Questran per recommendations by Dr. Johney Maine.   Pancytopenia with neutropenia, anemia and thrombocytopenia(HCC) -Continue neutropenic precautions, IV Rocephin -Counts improving, started on filgrastim daily for 3 doses  -Appreciate oncology recommendations, Dr. Julien Nordmann following   Acute respiratory failure with hypoxia -Noted patient on 6 L O2 via , sats 92 to 93% -I's and O's with 8.6 L positive, will give Lasix 40 mg IV x 1, DC IV fluids -Will repeat Lasix as needed -Wean O2 as tolerated  Hypotension with history of essential hypertension -Currently BP improved, IV fluids discontinued.  Received IV albumin on 2/21  -Random cortisol level normal.   -If BP dropping, will add  midodrine   Dysphagia/anorexia -Patient has not been eating, states loss of appetite, given right lung pneumonia, rule out aspiration -SLP evaluation pending. -Plan to place core track and start tube feeds for nutrition and meds     Adenocarcinoma of left lung, stage 2 (HCC) neutropenia -Chemotherapy currently on hold, continue IV Rocephin - WBC improving -Will hold XRT today     Hypokalemia -Replaced    AKI (acute kidney injury) ([place]) -Likely due to to severe sepsis, dehydration with poor p.o. intake -Continue IV fluids creatinine 1.85 on admission, baseline 1.09 on 06/04/2022 -Renal function improving, 1.1 today    Hyponatremia -Likely due to  poor p.o. intake, resolved with IV fluids     Acute metabolic encephalopathy -Likely due to #1, improving, patient fairly alert and oriented today  Acute transaminitis -Likely due to #1, hold Lipitor, continue to trend LFTs -Improving    Dyslipidemia -Hold Lipitor, until LFTs improved   Estimated body mass index is 21.39 kg/m as calculated from the following:   Height as of this encounter: 5\' 9"  (1.753 m).   Weight as of this encounter: 65.7 kg.  Code Status: Full CODE STATUS DVT Prophylaxis:  SCDs Start: 06/06/22 0153   Level of Care: Level of care: Stepdown Family Communication: Updated patient's wife on the phone in detail. Disposition Plan:      Remains inpatient appropriate: Acute medical conditions preclude any safe discharge.  Continue monitoring in stepdown unit   Procedures:  None  Consultants:   Infectious disease Oncology  Antimicrobials:   Anti-infectives (From admission, onward)    Start     Dose/Rate Route Frequency Ordered Stop   06/07/22 1200  vancomycin (VANCOCIN) IVPB 1000 mg/200 mL premix  Status:  Discontinued        1,000 mg 200 mL/hr over 60 Minutes Intravenous Every 36 hours 06/05/22 2323 06/06/22 1108   06/06/22 1200  cefTRIAXone (ROCEPHIN) 2 g in sodium chloride 0.9 % 100 mL IVPB        2 g 200 mL/hr over 30 Minutes Intravenous Every 24 hours 06/06/22 1110     06/06/22 0200  ceFEPIme (MAXIPIME) 2 g in sodium chloride 0.9 % 100 mL IVPB  Status:  Discontinued        2 g 200 mL/hr over 30 Minutes Intravenous Every 12 hours 06/05/22 2323 06/06/22 1108   06/05/22 2315  vancomycin (VANCOCIN) IVPB 1000 mg/200 mL premix        1,000 mg 200 mL/hr over 60 Minutes Intravenous  Once 06/05/22 2307 06/06/22 0145   06/05/22 1900  cefTRIAXone (ROCEPHIN) 2 g in sodium chloride 0.9 % 100 mL IVPB  Status:  Discontinued        2 g 200 mL/hr over 30 Minutes Intravenous Every 24 hours 06/05/22 1847 06/05/22 2323   06/05/22 1900  azithromycin (ZITHROMAX) 500  mg in sodium chloride 0.9 % 250 mL IVPB  Status:  Discontinued        500 mg 250 mL/hr over 60 Minutes Intravenous Every 24 hours 06/05/22 1847 06/06/22 1108   06/05/22 1830  ceFEPIme (MAXIPIME) 2 g in sodium chloride 0.9 % 100 mL IVPB  Status:  Discontinued        2 g 200 mL/hr over 30 Minutes Intravenous  Once 06/05/22 1820 06/05/22 1847   06/05/22 1830  metroNIDAZOLE (FLAGYL) IVPB 500 mg  Status:  Discontinued        500 mg 100 mL/hr over 60 Minutes Intravenous  Once 06/05/22 1820 06/05/22 1847  06/05/22 1830  vancomycin (VANCOCIN) IVPB 1000 mg/200 mL premix  Status:  Discontinued        1,000 mg 200 mL/hr over 60 Minutes Intravenous  Once 06/05/22 1820 06/05/22 1826   06/05/22 1830  vancomycin (VANCOREADY) IVPB 1500 mg/300 mL  Status:  Discontinued        1,500 mg 150 mL/hr over 120 Minutes Intravenous  Once 06/05/22 1826 06/05/22 1847          Medications  Chlorhexidine Gluconate Cloth  6 each Topical Daily   ipratropium-albuterol  3 mL Nebulization TID   sucralfate  1 g Oral TID WC & HS   Tbo-filgastrim (GRANIX) SQ  300 mcg Subcutaneous Daily      Subjective:   Jesse [name] was seen and examined today.  Much more alert and oriented today, BP has improved, persistently tachycardiac.  Overnight O2 sats down and placed on 6 L O2 via [place].  No fevers.  Continues to have significant diarrhea.    Objective:   Vitals:   06/07/22 0800 06/07/22 0845 06/07/22 0900 06/07/22 1000  BP: 112/77  115/61 (!) 102/58  Pulse: 83  (!) 122 (!) 119  Resp: (!) 28  (!) 25 (!) 21  Temp: (!) 97.3 F (36.3 C)     TempSrc: Oral     SpO2: 94% 96% 98% 93%  Weight:      Height:        Intake/Output Summary (Last 24 hours) at 06/07/2022 1057 Last data filed at 06/07/2022 1000 Gross per 24 hour  Intake 3480.39 ml  Output 1060 ml  Net 2420.39 ml     Wt Readings from Last 3 Encounters:  06/06/22 65.7 kg  06/04/22 61.6 kg  05/28/22 64 kg   Physical Exam General: Alert and oriented x  3, NAD ill-appearing, cachectic Cardiovascular: S1 S2 clear, RRR.  Tachycardia. Respiratory: Decreased breath sounds at bases with crackles Gastrointestinal: Soft, nontender, nondistended, NBS Ext: no pedal edema bilaterally Neuro: no new deficits Skin: No rashes Psych: Normal affect     Data Reviewed:  I have personally reviewed following labs    CBC Lab Results  Component Value Date   WBC 1.9 (L) 06/07/2022   RBC 2.69 (L) 06/07/2022   HGB 8.4 (L) 06/07/2022   HCT 25.1 (L) 06/07/2022   MCV 93.3 06/07/2022   MCH 31.2 06/07/2022   PLT 43 (L) 06/07/2022   MCHC 33.5 06/07/2022   RDW 15.2 06/07/2022   LYMPHSABS 0.0 (L) 06/06/2022   MONOABS 0.0 (L) 06/06/2022   EOSABS 0.0 06/06/2022   BASOSABS 0.0 95/62/1308     Last metabolic panel Lab Results  Component Value Date   NA 135 06/07/2022   K 3.2 (L) 06/07/2022   CL 105 06/07/2022   CO2 14 (L) 06/07/2022   BUN 44 (H) 06/07/2022   CREATININE 1.17 06/07/2022   GLUCOSE 88 06/07/2022   GFRNONAA >60 06/07/2022   GFRAA >60 12/31/2019   CALCIUM 7.7 (L) 06/07/2022   PHOS 2.8 06/06/2022   PROT 4.5 (L) 06/07/2022   ALBUMIN 2.4 (L) 06/07/2022   BILITOT 1.8 (H) 06/07/2022   ALKPHOS 64 06/07/2022   AST 68 (H) 06/07/2022   ALT 38 06/07/2022   ANIONGAP 16 (H) 06/07/2022    CBG (last 3)  Recent Labs    06/07/22 0007 06/07/22 0255 06/07/22 0816  GLUCAP 96 86 78      Coagulation Profile: Recent Labs  Lab 06/06/22 0035  INR 1.1     Radiology Studies: I  have personally reviewed the imaging studies  DG Chest Port 1 View  Result Date: 06/07/2022 CLINICAL DATA:  Shortness of breath EXAM: PORTABLE CHEST 1 VIEW COMPARISON:  06/05/2022 FINDINGS: Cardiac shadow is stable. Left lung is clear. Increasing right basilar infiltrate is noted. Some generalized increased density in the right hemithorax is seen which may be related to posterior effusion. No bony abnormality is seen. IMPRESSION: Increasing right basilar infiltrate and  likely posterior layering effusion. Electronically Signed   By: Inez Catalina M.D.   On: 06/07/2022 01:56   DG Chest Port 1 View  Result Date: 06/05/2022 CLINICAL DATA:  Per chart: Pt arrived via EMS, weakness and lethargic since saturday, poor PO intake. Lung cancer on the left side, no nausea, diarrhea is typical for him, had radiation today. EXAM: PORTABLE CHEST - 1 VIEW COMPARISON:  01/29/2020 FINDINGS: Suspect patchy right infrahilar airspace disease. Post partial left pneumonectomy. Heart size and mediastinal contours are within normal limits. No effusion. Right acromioclavicular spurring. IMPRESSION: Patchy right infrahilar airspace disease, new since previous Electronically Signed   By: Lucrezia Europe M.D.   On: 06/05/2022 18:37       Jesse [name] M.D. [facility] 06/07/2022, 10:57 AM  Available via Epic secure chat 7am-7pm After 7 pm, please refer to night coverage provider listed on amion.

## 2022-06-07 NOTE — Evaluation (Signed)
Occupational Therapy Evaluation Patient Details Name: Jesse [name] MRN: [id number] DOB: 1951/02/13 Today's Date: 06/07/2022   History of Present Illness 72yo male with significant hx of lung and bladder CA currently on chemo and radiation treatments, who presented with severe fatigue and confusion, cyanosis and soft BPs. Admitted with sepsis from unspecified organism and PNA R lung. PMH CA, COPD, HLD, lung mass, bladder CA, macular degeneration, hx ileostomy, B knee surgery   Clinical Impression   PTA pt lives at home with his wife and was functioning at an independent level, including driving himself to radiation. Wife states he had a recent decline 4 days ago. Pt easily fatigues with increased SOB and WOB with minimal activity. Able ot progress to EOB with mod A and stand with +2 min A for safety. Requires overall mod to max A with ADL tasks due to below listed deficits. Pt is hopeful to DC home, however may need ST rehab at SNF to facilitate safe DC home. Discussed with wife. Acute OT to follow. See vitals. Below.       Recommendations for follow up therapy are one component of a multi-disciplinary discharge planning process, led by the attending physician.  Recommendations may be updated based on patient status, additional functional criteria and insurance authorization.   Follow Up Recommendations  Skilled nursing-short term rehab (<3 hours/day) (hopeful to progress to [facility])     Assistance Recommended at Discharge Frequent or constant Supervision/Assistance  Patient can return home with the following A lot of help with walking and/or transfers;A lot of help with bathing/dressing/bathroom;Assistance with cooking/housework;Direct supervision/assist for medications management;Assist for transportation;Help with stairs or ramp for entrance    Functional Status Assessment  Patient has had a recent decline in their functional status and demonstrates the ability to make significant improvements  in function in a reasonable and predictable amount of time.  Equipment Recommendations  BSC/3in1    Recommendations for Other Services       Precautions / Restrictions Precautions Precautions: Fall;Other (comment) Precaution Comments: watch O2/HR/BPs Restrictions Weight Bearing Restrictions: No      Mobility Bed Mobility Overal bed mobility: Needs Assistance Bed Mobility: Supine to Sit, Sit to Supine     Supine to sit: Min assist, HOB elevated Sit to supine: Min assist, +2 for physical assistance, HOB elevated   General bed mobility comments: Min-MinAx2 for physical assist to boost up/around to EOB, also assist for line management    Transfers Overall transfer level: Needs assistance Equipment used: 2 person hand held assist Transfers: Sit to/from Stand Sit to Stand: Min assist, +2 physical assistance, +2 safety/equipment           General transfer comment: MinAx2 to boost to standing and maintain balance to take 2-3 side steps up the bed      Balance Overall balance assessment: Needs assistance   Sitting balance-[name]: Good Sitting balance - Comments: min guard for safety   Standing balance support: Bilateral upper extremity supported, During functional activity Standing balance-[name]: Poor Standing balance comment: reliant on external support from therapists                           ADL either performed or assessed with clinical judgement   ADL Overall ADL's : Needs assistance/impaired     Grooming: Set up;Supervision/safety   Upper Body Bathing: Minimal assistance;Sitting   Lower Body Bathing: Moderate assistance;Sit to/from stand   Upper Body Dressing : Minimal assistance  Lower Body Dressing: Sit to/from stand;Maximal assistance   Toilet Transfer: Minimal assistance;+2 for physical assistance (stand step)   Toileting- Clothing Manipulation and Hygiene: Maximal assistance Toileting - Clothing Manipulation Details  (indicate cue type and reason): diarrhea     Functional mobility during ADLs: Minimal assistance;+2 for safety/equipment;Cueing for safety       Vision Baseline Vision/History: 1 Wears glasses Patient Visual Report: Central vision impairment (MD; no vision changes)       Perception     Praxis      Pertinent Vitals/Pain Pain Assessment Pain Assessment: Faces Faces Pain Scale: Hurts a little bit Pain Location: joints, pain in general- difficult to hone in on location Pain Descriptors / Indicators: Discomfort Pain Intervention(s): Limited activity within patient's tolerance     Hand Dominance Right   Extremity/Trunk Assessment Upper Extremity Assessment Upper Extremity Assessment: Generalized weakness   Lower Extremity Assessment Lower Extremity Assessment: Defer to PT evaluation   Cervical / Trunk Assessment Cervical / Trunk Assessment: Kyphotic   Communication Communication Communication: No difficulties;Other (comment) (mumbles may be baseline)   Cognition Arousal/Alertness: Awake/alert Behavior During Therapy: WFL for tasks assessed/performed Overall Cognitive Status: Impaired/Different from baseline Area of Impairment: Attention, Memory, Awareness                               General Comments: spoke with wife; pt thinks he has been in the hospital for 6 days; no aware of day; unabl eto give accurate hx - states he has required help for weeks/month adn wife states he has only delcined since this past Sunday - was driving himself to radiation     General Comments  HR 120-130 at rest, up to 140BPM highest observed during session, SPO2 with mixed signal quality, lowest number observed with good signal 86-87% with mobility on 6LPM O2 per [place]. Dizzy but limited orthostatics negative today, will reassess these when he is able to tolerate more mobility    Exercises Exercises: General Upper Extremity General Exercises - Upper Extremity Shoulder Flexion:  AROM, Strengthening, Both, 10 reps, Supine Elbow Flexion: AROM, Both, 10 reps, Supine Elbow Extension: AROM, Strengthening, Both, 10 reps, Supine   Shoulder Instructions      Home Living Family/patient expects to be discharged to:: Private residence Living Arrangements: Spouse/significant other Available Help at Discharge: Family;Available 24 hours/day Type of Home: House Home Access: Stairs to enter [employer] of Steps: 3 with B rail   Home Layout: Multi-level Alternate Level Stairs-Number of Steps: can live on one level   Bathroom Shower/Tub: Occupational psychologist: Standard Bathroom Accessibility: Yes How Accessible: Accessible via walker Home Equipment: [place] (2 wheels);Shower seat;Grab bars - tub/shower;Grab bars - toilet   Additional Comments: rollator on the way/ordered? "has one coming", also tells [place] wife might have ordered [facility] as well      Prior Functioning/Environment Prior Level of Function : Independent/Modified Independent             Mobility Comments: had rapid decline over past couple of weeks, wife did help with bathing/dressing somewhat at baseline has been a couple weeks since he was fully independent with dressing/bathing. "she was doing some and I was doing some a month ago". Sounds like radiation has really impacted him in terms of function and independence          OT Problem List: Decreased strength;Decreased activity tolerance;Impaired balance (sitting and/or standing);Decreased cognition;Decreased safety awareness;Decreased knowledge of  use of DME or AE;Cardiopulmonary status limiting activity;Pain      OT Treatment/Interventions: Self-care/ADL training;Therapeutic exercise;Energy conservation;DME and/or AE instruction;Therapeutic activities;Cognitive remediation/compensation;Patient/family education;Balance training    OT Goals(Current goals can be found in the care plan section) Acute Rehab OT Goals Patient  Stated Goal: to get stronger OT Goal Formulation: With patient/family Time For Goal Achievement: 06/21/22 Potential to Achieve Goals: Good ADL Goals Pt Will Perform Lower Body Bathing: with min assist;sit to/from stand Pt Will Perform Lower Body Dressing: with min assist;sit to/from stand Pt Will Transfer to Toilet: with min guard assist;ambulating Additional ADL Goal #1: Pt will independently verbalize 3 energy conservation strategies  OT Frequency: Min 2X/week    Co-evaluation PT/OT/SLP Co-Evaluation/Treatment: Yes Reason for Co-Treatment: Complexity of the patient's impairments (multi-system involvement);For patient/therapist safety   OT goals addressed during session: ADL's and self-care      AM-PAC OT "6 Clicks" Daily Activity     Outcome Measure Help from another person eating meals?: None Help from another person taking care of personal grooming?: A Little Help from another person toileting, which includes using toliet, bedpan, or urinal?: A Lot Help from another person bathing (including washing, rinsing, drying)?: A Lot Help from another person to put on and taking off regular upper body clothing?: A Little Help from another person to put on and taking off regular lower body clothing?: A Lot 6 Click Score: 16   End of Session Nurse Communication: Mobility status  Activity Tolerance: Patient limited by fatigue Patient left: in bed;with call bell/phone within reach;with bed alarm set  OT Visit Diagnosis: Other abnormalities of gait and mobility (R26.89);Unsteadiness on feet (R26.81);Muscle weakness (generalized) (M62.81);Other symptoms and signs involving cognitive function;Pain Pain - part of body:  (generalized)                Time: 7510-2585 OT Time Calculation (min): 23 min Charges:  OT General Charges $OT Visit: 1 Visit OT Evaluation $OT Eval Moderate Complexity: [place], OT/L   Acute OT Clinical Specialist [place] Pager  [phone] Office [phone]   [facility] 06/07/2022, 10:59 AM

## 2022-06-07 NOTE — Progress Notes (Addendum)
[facility] for Infectious Disease    Date of Admission:  06/05/2022   Total days of antibiotics 2        Day 1 ceftriaxone           ID: Jesse [name] is a 72 y.o. male with   Principal Problem:   Severe sepsis ([place]) Active Problems:   Dyslipidemia   Essential hypertension   Adenocarcinoma of left lung, stage 2 (HCC)   Malignant neoplasm of lung (HCC)   Hypokalemia   AKI (acute kidney injury) ([place])   Hyponatremia   Acute metabolic encephalopathy   Acute respiratory failure with hypoxia (HCC)   CAP (community acquired pneumonia)   Thrombocytopenia ([place])   Sepsis ([place])   Diarrhea   Bacteremia due to Klebsiella pneumoniae   Pneumonia of right middle lobe due to Klebsiella pneumoniae ([place])    Subjective: Episode of SOB noted o/n. CXR notes increased infiltrate and potential effusion.  Feels better  Medications:   Chlorhexidine Gluconate Cloth  6 each Topical Daily   ipratropium-albuterol  3 mL Nebulization TID   sucralfate  1 g Oral TID WC & HS   Tbo-filgastrim (GRANIX) SQ  300 mcg Subcutaneous Daily    Objective: Vital signs in last 24 hours: Temp:  [97.3 F (36.3 C)-98.8 F (37.1 C)] 97.3 F (36.3 C) (02/22 0800) Pulse Rate:  [96-110] 104 (02/22 0500) Resp:  [17-26] 23 (02/22 0500) BP: (96-136)/(57-117) 121/66 (02/22 0500) SpO2:  [82 %-96 %] 96 % (02/22 0845)   General appearance: alert, cooperative, and no distress Resp: rhonchi anterior - bilateral Cardio: regular rate and rhythm GI: normal findings: bowel sounds normal, soft, non-tender, and mild-mod distension  Lab Results Recent Labs    06/06/22 0603 06/06/22 1737 06/07/22 0324  WBC 1.0*  --  1.9*  HGB 7.9* 9.6* 8.4*  HCT 24.0* 29.5* 25.1*  NA 131*  --  135  K 3.0*  --  3.2*  CL 99  --  105  CO2 22  --  14*  BUN 50*  --  44*  CREATININE 1.67*  --  1.17   Liver Panel Recent Labs    06/06/22 0035 06/06/22 0603 06/07/22 0324  PROT 4.7* 4.5* 4.5*  ALBUMIN 2.0* 2.0* 2.4*  AST  119* 107* 68*  ALT 57* 52* 38  ALKPHOS 78 70 64  BILITOT 1.0 1.0 1.8*  BILIDIR 0.6*  --   --   IBILI 0.4  --   --    Sedimentation Rate No results for input(s): "ESRSEDRATE" in the last 72 hours. C-Reactive Protein No results for input(s): "CRP" in the last 72 hours.  Microbiology:  Studies/Results: DG Chest Port 1 View  Result Date: 06/07/2022 CLINICAL DATA:  Shortness of breath EXAM: PORTABLE CHEST 1 VIEW COMPARISON:  06/05/2022 FINDINGS: Cardiac shadow is stable. Left lung is clear. Increasing right basilar infiltrate is noted. Some generalized increased density in the right hemithorax is seen which may be related to posterior effusion. No bony abnormality is seen. IMPRESSION: Increasing right basilar infiltrate and likely posterior layering effusion. Electronically Signed   By: Inez Catalina M.D.   On: 06/07/2022 01:56   DG Chest Port 1 View  Result Date: 06/05/2022 CLINICAL DATA:  Per chart: Pt arrived via EMS, weakness and lethargic since saturday, poor PO intake. Lung cancer on the left side, no nausea, diarrhea is typical for him, had radiation today. EXAM: PORTABLE CHEST - 1 VIEW COMPARISON:  01/29/2020 FINDINGS: Suspect patchy right infrahilar airspace  disease. Post partial left pneumonectomy. Heart size and mediastinal contours are within normal limits. No effusion. Right acromioclavicular spurring. IMPRESSION: Patchy right infrahilar airspace disease, new since previous Electronically Signed   By: Lucrezia Europe M.D.   On: 06/05/2022 18:37     Assessment/Plan: Klebsiella bacteremia Lung Cancer, last CTX 2-11 per family Previous Bladder Cancer AKI   Anbx- day 2 ceftriaxone Now on 6L. Tapering down with diureses.  WBC has improved, please also follow his [place]. Ask lab to add diff.  Await sensi on his Kleb, non-KPC/ESBL. Replete K Cr has normalized.  Stop GI precautions.   [facility] for Infectious Diseases Pager: [phone]  06/07/2022, 8:52  AM

## 2022-06-07 NOTE — Evaluation (Signed)
Physical Therapy Evaluation Patient Details Name: MITHCELL [name] MRN: [id number] DOB: [birth year] Today's Date: 06/07/2022  History of Present Illness  72yo male with significant hx of lung and bladder CA currently on chemo and radiation treatments, who presented with severe fatigue and confusion, cyanosis and soft BPs. Admitted with sepsis from unspecified organism and PNA R lung. PMH CA, COPD, HLD, lung mass, bladder CA, macular degeneration, hx ileostomy, B knee surgery  Clinical Impression   Pt received in bed, cleared for PT by RN. HR 120-130 at rest. Able to generally mobilize with MinAx2 from PT/OT but limited by vitals today- HR up to 140BPM max observed, lowest observed value with good signal 87% SPO2 on 6LPM this morning. Very easily fatigued. Left in bed with all needs met, stable and RN aware of pt status. Would very much benefit from SNF prior to return home, will make every effort to progress mobility as much as possible during this hospital stay.        Recommendations for follow up therapy are one component of a multi-disciplinary discharge planning process, led by the attending physician.  Recommendations may be updated based on patient status, additional functional criteria and insurance authorization.  Follow Up Recommendations Skilled nursing-short term rehab (<3 hours/day) (may be able to improve to HHPT level, TBD) Can patient physically be transported by private vehicle: No    Assistance Recommended at Discharge Frequent or constant Supervision/Assistance  Patient can return home with the following  A lot of help with bathing/dressing/bathroom;Direct supervision/assist for medications management;A lot of help with walking and/or transfers;Direct supervision/assist for financial management;Assistance with cooking/housework;Assist for transportation;Help with stairs or ramp for entrance    Equipment Recommendations Rolling walker (2 wheels);BSC/3in1;Wheelchair (measurements  PT);Wheelchair cushion (measurements PT)  Recommendations for Other Services       Functional Status Assessment Patient has had a recent decline in their functional status and demonstrates the ability to make significant improvements in function in a reasonable and predictable amount of time.     Precautions / Restrictions Precautions Precautions: Fall;Other (comment) Precaution Comments: watch O2/HR/BPs Restrictions Weight Bearing Restrictions: No      Mobility  Bed Mobility Overal bed mobility: Needs Assistance Bed Mobility: Supine to Sit, Sit to Supine     Supine to sit: Min assist, HOB elevated Sit to supine: Min assist, +2 for physical assistance, HOB elevated   General bed mobility comments: Min-MinAx2 for physical assist to boost up/around to EOB, also assist for line management    Transfers Overall transfer level: Needs assistance Equipment used: 2 person hand held assist Transfers: Sit to/from Stand Sit to Stand: Min assist, +2 physical assistance, +2 safety/equipment           General transfer comment: MinAx2 to boost to standing and maintain balance to take 2-3 side steps up the bed    Ambulation/Gait               General Gait Details: DNT, safety- elevated HR/SPO2 desat  Stairs            Wheelchair Mobility    Modified Rankin (Stroke Patients Only)       Balance Overall balance assessment: Needs assistance   Sitting balance-[name]: Good Sitting balance - Comments: min guard for safety   Standing balance support: Bilateral upper extremity supported, During functional activity Standing balance-[name]: Poor Standing balance comment: reliant on external support from therapists  Pertinent Vitals/Pain Pain Assessment Pain Assessment: Faces Faces Pain Scale: Hurts a little bit Pain Location: joints, pain in general- difficult to hone in on location Pain Descriptors / Indicators:  Discomfort Pain Intervention(s): Limited activity within patient's tolerance, Monitored during session    [place] expects to be discharged to:: Private residence Living Arrangements: Spouse/significant other Available Help at Discharge: Family;Available 24 hours/day Type of Home: House Home Access: Stairs to enter   [employer] of Steps: 3 with B rail Alternate Level Stairs-Number of Steps: can live on one level Home Layout: Multi-level Home Equipment: Conservation officer, nature (2 wheels);Shower seat;Grab bars - tub/shower;Grab bars - toilet Additional Comments: rollator on the way/ordered? "has one coming", also tells [place] wife might have ordered [facility] as well    Prior Function Prior Level of Function : Independent/Modified Independent             Mobility Comments: had rapid decline over past couple of weeks, wife did help with bathing/dressing somewhat at baseline has been a couple weeks since he was fully independent with dressing/bathing. "she was doing some and I was doing some a month ago". Sounds like radiation has really impacted him in terms of function and independence       Hand Dominance        Extremity/Trunk Assessment   Upper Extremity Assessment Upper Extremity Assessment: Defer to OT evaluation    Lower Extremity Assessment Lower Extremity Assessment: Generalized weakness    Cervical / Trunk Assessment Cervical / Trunk Assessment: Kyphotic  Communication   Communication: No difficulties;Other (comment) (mumbles may be baseline)  Cognition Arousal/Alertness: Awake/alert Behavior During Therapy: WFL for tasks assessed/performed Overall Cognitive Status: No family/caregiver present to determine baseline cognitive functioning                                 General Comments: gave a bit of unclear history with PLOF, does seem to have possible STM impairment but followed cues well and interacted appropriately with therapists         General Comments General comments (skin integrity, edema, etc.): HR 120-130 at rest, up to 140BPM highest observed during session, SPO2 with mixed signal quality, lowest number observed with good signal 86-87% with mobility on 6LPM O2 per [place]. Dizzy but limited orthostatics negative today, will reassess these when he is able to tolerate more mobility    Exercises     Assessment/Plan    PT Assessment Patient needs continued PT services  PT Problem List Decreased strength;Decreased cognition;Decreased activity tolerance;Decreased safety awareness;Decreased balance;Pain;Decreased mobility;Cardiopulmonary status limiting activity;Decreased coordination       PT Treatment Interventions DME instruction;Balance training;Gait training;Neuromuscular re-education;Stair training;Cognitive remediation;Functional mobility training;Patient/family education;Therapeutic activities;Therapeutic exercise    PT Goals (Current goals can be found in the Care Plan section)  Acute Rehab PT Goals Patient Stated Goal: be able to work in garden PT Goal Formulation: With patient Time For Goal Achievement: 06/21/22 Potential to Achieve Goals: Fair    Frequency Min 3X/week     Co-evaluation               AM-PAC PT "6 Clicks" Mobility  Outcome Measure Help needed turning from your back to your side while in a flat bed without using bedrails?: A Little Help needed moving from lying on your back to sitting on the side of a flat bed without using bedrails?: A Little Help needed moving to and from a bed to  a chair (including a wheelchair)?: Total Help needed standing up from a chair using your arms (e.g., wheelchair or bedside chair)?: Total Help needed to walk in hospital room?: Total Help needed climbing 3-5 steps with a railing? : Total 6 Click Score: 10    End of Session Equipment Utilized During Treatment: Oxygen Activity Tolerance: Patient tolerated treatment well;Patient limited by  fatigue Patient left: in bed;with call bell/phone within reach;with bed alarm set Nurse Communication: Mobility status;Other (comment) (HR/SPO2/mobility) PT Visit Diagnosis: Unsteadiness on feet (R26.81);Difficulty in walking, not elsewhere classified (R26.2);Muscle weakness (generalized) (M62.81)    Time: 0623-7628 PT Time Calculation (min) (ACUTE ONLY): 16 min   Charges:   PT Evaluation $PT Eval Moderate Complexity: 1 Mod         Deniece Ree PT DPT PN2

## 2022-06-07 NOTE — Progress Notes (Signed)
At 1700 this nurse noted increased confusion, MD Rai notified. Patient pulled NGT that was placed 3 hours prior. Patient also is not able to recall the month/year. Patient continues to be fidgety. Called patients wife and she also noted some confusion during her visit this afternoon around 12pm. Wife also noted that days before admission the patient would fidget/ mess with things in her car and not able to give a reason why. While talking to wife the patient began to vomit profusely green emesis.   MD Rai paged, and orders received for STAT head CT scan, Ammonia level check, ABG, NPO, and to hold tube feeds until further notice.  Will continue to monitor

## 2022-06-07 NOTE — Progress Notes (Signed)
Initial Nutrition Assessment  DOCUMENTATION CODES:   Severe malnutrition in context of chronic illness  INTERVENTION:  - DYS 3 diet for comfort as medically appropriate.   - Plan to start tube feeds: Initiate tube feeding via small bore feeding tube (tip xray verified in stomach per MD read of xray): Osmolite 1.5 at 60 ml/h (1440 ml per day) *Start at 60mL/hr and advance as tolerated by 37mL every 12 hours to goal Prosource TF20 60 ml daily Provides 2240 kcal, 110 gm protein, 1097 ml free water daily  - Monitor magnesium, potassium, and phosphorus BID for at least 3 days, MD to replete as needed, as pt is at VERY high risk for refeeding syndrome given severe malnutrition. - 100mg  thiamine x5 days to aid in macronutrient metabolism.  - Free water flushes per MD. Consider 131mL Q4H to provide a total of 1980mL/day  - Monitor weight trends.    NUTRITION DIAGNOSIS:   Severe Malnutrition related to chronic illness (lung cancer currently undergoing chemotherapy and radiation therapy) as evidenced by severe fat depletion, severe muscle depletion, percent weight loss (15% in 6 months).  GOAL:   Patient will meet greater than or equal to 90% of their needs  MONITOR:   PO intake, Diet advancement, Labs, Weight trends, TF tolerance  REASON FOR ASSESSMENT:   Consult Assessment of nutrition requirement/status, Enteral/tube feeding initiation and management  ASSESSMENT:   72 y.o. male with medical history significant of lung cancer currently undergoing chemotherapy and radiation therapy, HTN, HLD, history of bladder cancer, GERD, hypokalemia, recurrent anemia, COPD, macular degeneration who presented with severe fatigue and confusion.    Met with patient and wife at bedside today. Wife provided all of patient's history. Patient's UBW reported to be around 170-180#. Wife reports patient really started to lose weight over the past 6 months but mostly over the past few weeks due to eating  almost nothing.  Per EMR, patient weighed at 171# in August and now weighed at 145# - a 26# or 15% weight loss in 6 months, significant and severe for the time frame.   Wife report's patient has not had an appetite for several months and has had taste changes due to chemo and radiation. Foods that he used to love he can no longer stand. She endorses that patient hasn't had a full meal in the past 3 weeks, only bites of food or sips of liquids. She has tried to get him to drink every supplement there is but he won't as he says they taste too sweet.   Patient seen by SLP today and recommended a DYS 3 diet with thin liquids.   Per discussion with MD, plan to place small bore feeding tube and start tube feeds to support nutrition needs. Recommended meeting 100% of needs with tube feeds and anything patient eats is for comfort. Also discussed patient at very high risk for refeeding syndrome so plan to start at only 10ML/hr and advance every 12 hours. Ordering thiamine. Will need close monitoring of electrolytes.   Medications reviewed and include: Carafate  Labs reviewed:  K+ 3.2   NUTRITION - FOCUSED PHYSICAL EXAM:  Flowsheet Row Most Recent Value  Orbital Region Severe depletion  Upper Arm Region Severe depletion  Thoracic and Lumbar Region Severe depletion  Buccal Region Severe depletion  Temple Region Severe depletion  Clavicle Bone Region Severe depletion  Clavicle and Acromion Bone Region Severe depletion  Scapular Bone Region Unable to assess  Dorsal Hand Moderate depletion  Patellar Region  Severe depletion  Anterior Thigh Region Severe depletion  Posterior Calf Region Severe depletion  Edema (RD Assessment) None  Hair Reviewed  Eyes Reviewed  Mouth Reviewed  Skin Reviewed  Nails Reviewed       Diet Order:   Diet Order             DIET DYS 3 Room service appropriate? Yes; Fluid consistency: Thin  Diet effective now                   EDUCATION NEEDS:  Education  needs have been addressed  Skin:  Skin Assessment: Reviewed RN Assessment  Last BM:  2/22  Height:  Ht Readings from Last 1 Encounters:  06/06/22 5\' 9"  (1.753 m)   Weight:  Wt Readings from Last 1 Encounters:  06/06/22 65.7 kg   BMI:  Body mass index is 21.39 kg/m.  Estimated Nutritional Needs:  Kcal:  1950-2300 kcals Protein:  110-130 grams Fluid:  >/= 1.9L    Samson Frederic RD, LDN For contact information, refer to [facility].

## 2022-06-07 NOTE — Progress Notes (Signed)
       CROSS COVER NOTE  NAME: [name] MRN: [id number] DOB : [birth year]    Date of Service   06/07/2022     HPI/Events of Note   0200-patient complaining of shortness of breath.    Per RN wheezing auscultated.  Now requiring 5 to 6 L O2, prior oxygen need was 2 L nasal cannula.  BP remaining soft, 90/70.   Will order chest x-ray and neb. 2/22 Chest x-ray --> increasing right basilar infiltrate and likely posterior layering effusion.  Patient expresses improvement after receiving nebulizer.    Interventions/ Plan   Chest x-ray Duoneb        Raenette Rover, DNP, [place] [facility]

## 2022-06-07 NOTE — Evaluation (Signed)
Clinical/Bedside Swallow Evaluation Patient Details  Name: Jesse [name] MRN: [id number] Date of Birth: [birth year]  Today's Date: 06/07/2022 Time: SLP Start Time (ACUTE ONLY): 1042 SLP Stop Time (ACUTE ONLY): 1110 SLP Time Calculation (min) (ACUTE ONLY): 28 min  Past Medical History:  Past Medical History:  Diagnosis Date   Anemia    Cancer ([place])    bladder cancer   COPD (chronic obstructive pulmonary disease) ([place])    mild    DIVERTICULOSIS, COLON 01/14/2007   Qualifier: Diagnosis of  By: Paulina Fusi RN, [name]    GASTRITIS, CHRONIC 06/01/2004   Qualifier: Diagnosis of  By: Nolon Rod CMA (AAMA), Robin     GERD 01/09/2007   Qualifier: Diagnosis of  By: Sherren Mocha, RN, Ellen     Heart murmur    as a child; no murmur heard 12/14/19   HYPERLIPIDEMIA 01/09/2007   Qualifier: Diagnosis of  By: Sherren Mocha RN, Dorian Pod     Hypertension    Lung mass    left upper nodule   Macular degeneration    right eye   NEOPLASM, MALIGNANT, BLADDER, HX OF 2002   Qualifier: Diagnosis of  By: Nolon Rod CMA (AAMA), Robin  / no chemo or radiation   OSTEOARTHRITIS 01/14/2007   Qualifier: Diagnosis of  By: Paulina Fusi, RN, [name] - knees   Rectal mass    Reflux esophagitis 06/01/2004   Qualifier: Diagnosis of  By: Nolon Rod CMA (AAMA), Robin     TOBACCO USER 02/16/2009   Qualifier: Diagnosis of  By: Burnice Logan  MD, Doretha Sou    VITAMIN D DEFICIENCY 06/03/2007   Qualifier: Diagnosis of  By: Burnice Logan  MD, Doretha Sou    Past Surgical History:  Past Surgical History:  Procedure Laterality Date   BREAST BIOPSY Left 03/16/2022   [place] LT RADIOACTIVE SEED LOC 03/16/2022 [facility]   BRONCHIAL BIOPSY  10/13/2019   Procedure: BRONCHIAL BIOPSIES;  Surgeon: Garner Nash, DO;  Location: [facility];  Service: Pulmonary;;   BRONCHIAL BRUSHINGS  10/13/2019   Procedure: BRONCHIAL BRUSHINGS;  Surgeon: Garner Nash, DO;  Location: [place];  Service: Pulmonary;;   BRONCHIAL NEEDLE ASPIRATION BIOPSY  10/13/2019    Procedure: BRONCHIAL NEEDLE ASPIRATION BIOPSIES;  Surgeon: Garner Nash, DO;  Location: [place];  Service: Pulmonary;;   BRONCHIAL NEEDLE ASPIRATION BIOPSY  04/11/2022   Procedure: BRONCHIAL NEEDLE ASPIRATION BIOPSIES;  Surgeon: Garner Nash, DO;  Location: [facility];  Service: Cardiopulmonary;;   BRONCHIAL WASHINGS  10/13/2019   Procedure: BRONCHIAL WASHINGS;  Surgeon: Garner Nash, DO;  Location: [place];  Service: Pulmonary;;   COLONOSCOPY     multiple   DIVERTING ILEOSTOMY N/A 12/14/2021   Procedure: DIVERTING ILEOSTOMY;  Surgeon: Michael Boston, MD;  Location: [facility];  Service: General;  Laterality: N/A;   ENDOBRONCHIAL ULTRASOUND Bilateral 04/11/2022   Procedure: ENDOBRONCHIAL ULTRASOUND;  Surgeon: Garner Nash, DO;  Location: [facility];  Service: Cardiopulmonary;  Laterality: Bilateral;   ILEOSTOMY CLOSURE N/A 03/22/2022   Procedure: OPEN TAKEDOWN OF LOOP ILEOSTOMY;  Surgeon: Michael Boston, MD;  Location: [facility];  Service: General;  Laterality: N/A;  GEN w/ERAS PATHWAY LOCAL   INTERCOSTAL NERVE BLOCK Left 12/16/2019   Procedure: INTERCOSTAL NERVE BLOCK;  Surgeon: Lajuana Matte, MD;  Location: [place];  Service: Thoracic;  Laterality: Left;   KNEE SURGERY  07/20/2008   bilat.   NASAL SEPTUM SURGERY  1995   NODE DISSECTION Left 12/16/2019   Procedure: NODE DISSECTION;  Surgeon: Lajuana Matte, MD;  Location: [facility];  Service: Thoracic;  Laterality: Left;   RADIOACTIVE SEED GUIDED AXILLARY SENTINEL LYMPH NODE Left 03/20/2022   Procedure: RADIOACTIVE SEED GUIDED LEFT AXILLARY SENTINEL LYMPH NODE BIOPSY;  Surgeon: Erroll Luna, MD;  Location: [place];  Service: General;  Laterality: Left;   RECTAL EXAM UNDER ANESTHESIA N/A 03/22/2022   Procedure: ANORECTAL EXAMINATION UNDER ANESTHESIA;  Surgeon: Michael Boston, MD;  Location: [facility];  Service: General;  Laterality: N/A;   TONSILLECTOMY     TRANSURETHRAL RESECTION OF BLADDER TUMOR  2002   UPPER  GASTROINTESTINAL ENDOSCOPY     VIDEO BRONCHOSCOPY  04/11/2022   Procedure: VIDEO BRONCHOSCOPY;  Surgeon: Garner Nash, DO;  Location: [facility];  Service: Cardiopulmonary;;   VIDEO BRONCHOSCOPY WITH ENDOBRONCHIAL NAVIGATION N/A 10/13/2019   Procedure: VIDEO BRONCHOSCOPY WITH ENDOBRONCHIAL NAVIGATION;  Surgeon: Garner Nash, DO;  Location: [place];  Service: Pulmonary;  Laterality: N/A;   WISDOM TOOTH EXTRACTION     XI ROBOTIC ASSISTED LOWER ANTERIOR RESECTION N/A 12/14/2021   Procedure: XI ROBOTIC ASSISTED LOWER ANTERIOR ULTRA LOW RECTOSIGMOID RESECTION, COLOANAL HAND SEWN ANASTOMOSIS, AND BILATERAL TAP BLOCK;  Surgeon: Michael Boston, MD;  Location: [facility];  Service: General;  Laterality: N/A;   HPI:  72 yo male adm to [facility] with weakness and lethargy since saturday as well as poor PO intake. Pt has PMH + for Lung cancer on the left side, reflux esophagitis, GERD, Klebsiella pneumonia, GIST.  He had some increased RR and tachdcardic but now on 6 liters [place].  Swallow eval ordered.    Assessment / Plan / Recommendation  Clinical Impression  Patient presents with clinical indications of dysphagia due to his esophagitis - but also suspect some component of deconditioning contributing.  He has no focal CN deficits but is generally weak.  Pt reports poor intake due to lack of appetite and discomfort with swallowing.  With water via straw- pt demonstated productive cough with expectoration of secretions within approx 3 seconds of swallowing- uncertain if due to secretion retention,  GERD, etc.  Self feeding small bites/sips via tsp or cup were tolerated without coughing and slight delayed swallow to compensate for his odynophagia.   Note plans for pt to obtain small bore feeding tube today for nutrition, thus advised he consume po for comfort/enjoyment and to decrease disuse muscle atrophy. Posted sign in room per family request advising against spicy, hot, ice cold, acidic iems.  Recommend advoid  using straws if causes him to cough.  Will follow up for dysphagia management. SLP Visit Diagnosis: Dysphagia, pharyngoesophageal phase (R13.14)    Aspiration Risk  Moderate aspiration risk    Diet Recommendation Dysphagia 3 (Mech soft);Thin liquid   Liquid Administration via: Cup;Straw Medication Administration: Whole meds with liquid Supervision: Staff to assist with self feeding;Full supervision/cueing for compensatory strategies Compensations: Slow rate;Small sips/bites Postural Changes: Seated upright at 90 degrees;Remain upright for at least 30 minutes after po intake    Other  Recommendations Oral Care Recommendations: Oral care QID    Recommendations for follow up therapy are one component of a multi-disciplinary discharge planning process, led by the attending physician.  Recommendations may be updated based on patient status, additional functional criteria and insurance authorization.  Follow up Recommendations Other (comment)      Assistance Recommended at Discharge  N/a  Functional Status Assessment Patient has had a recent decline in their functional status and demonstrates the ability to make significant improvements in function in a reasonable and predictable amount of time.  Frequency and Duration min 1 x/week  1 week       Prognosis Prognosis for improved oropharyngeal function: Good      Swallow Study   General Date of Onset: 06/07/22 HPI: 72 yo male adm to [facility] with weakness and lethargy since saturday as well as poor PO intake. Pt has PMH + for Lung cancer on the left side, reflux esophagitis, GERD, Klebsiella pneumonia, GIST.  He had some increased RR and tachdcardic but now on 6 liters [place].  Swallow eval ordered. Type of Study: Bedside Swallow Evaluation Diet Prior to this Study: Dysphagia 3 (mechanical soft);Thin liquids (Level 0) Temperature Spikes Noted: No Respiratory Status: Nasal cannula History of Recent Intubation: No Behavior/Cognition:  Alert;Cooperative Oral Cavity Assessment: Within Functional Limits Oral Care Completed by SLP: No Oral Cavity - Dentition: Adequate natural dentition Vision: Functional for self-feeding Self-Feeding Abilities: Able to feed self Patient Positioning: Upright in bed Baseline Vocal Quality: Low vocal intensity Volitional Cough: Other (Comment) (strong when reflexive - but causes severe pain) Volitional Swallow: Unable to elicit (due to pain and xerostomia)    Oral/Motor/Sensory Function Overall Oral Motor/Sensory Function: Within functional limits   Ice Chips Ice chips: Not tested   Thin Liquid Thin Liquid: Impaired Presentation: Cup;Spoon;Straw Pharyngeal  Phase Impairments: Cough - Immediate;Suspected delayed Swallow    Nectar Thick Nectar Thick Liquid: Not tested   Honey Thick Honey Thick Liquid: Not tested   Puree Puree: Within functional limits Presentation: Self Fed;Spoon Pharyngeal Phase Impairments: Suspected delayed Swallow   Solid     Solid: Within functional limits Presentation: Self Fed Pharyngeal Phase Impairments: Suspected delayed Swallow      [name] 06/07/2022,11:35 AM  Kathleen Lime, MS [place] Office [phone]

## 2022-06-08 ENCOUNTER — Other Ambulatory Visit: Payer: Self-pay

## 2022-06-08 ENCOUNTER — Ambulatory Visit
Admission: RE | Admit: 2022-06-08 | Discharge: 2022-06-08 | Disposition: A | Discharge status: Home / Self Care | Payer: Medicare Other | Source: Ambulatory Visit | Attending: Radiation Oncology | Admitting: Radiation Oncology

## 2022-06-08 DIAGNOSIS — F17210 Nicotine dependence, cigarettes, uncomplicated: Secondary | ICD-10-CM | POA: Diagnosis not present

## 2022-06-08 DIAGNOSIS — R6520 Severe sepsis without septic shock: Secondary | ICD-10-CM | POA: Diagnosis not present

## 2022-06-08 DIAGNOSIS — C771 Secondary and unspecified malignant neoplasm of intrathoracic lymph nodes: Secondary | ICD-10-CM | POA: Diagnosis not present

## 2022-06-08 DIAGNOSIS — J150 Pneumonia due to Klebsiella pneumoniae: Secondary | ICD-10-CM | POA: Diagnosis not present

## 2022-06-08 DIAGNOSIS — C3492 Malignant neoplasm of unspecified part of left bronchus or lung: Secondary | ICD-10-CM | POA: Diagnosis not present

## 2022-06-08 DIAGNOSIS — Z510 Encounter for antineoplastic radiation therapy: Secondary | ICD-10-CM | POA: Diagnosis not present

## 2022-06-08 DIAGNOSIS — E876 Hypokalemia: Secondary | ICD-10-CM | POA: Diagnosis not present

## 2022-06-08 DIAGNOSIS — C3412 Malignant neoplasm of upper lobe, left bronchus or lung: Secondary | ICD-10-CM | POA: Diagnosis not present

## 2022-06-08 DIAGNOSIS — D702 Other drug-induced agranulocytosis: Secondary | ICD-10-CM | POA: Diagnosis not present

## 2022-06-08 DIAGNOSIS — A419 Sepsis, unspecified organism: Secondary | ICD-10-CM | POA: Diagnosis not present

## 2022-06-08 DIAGNOSIS — J189 Pneumonia, unspecified organism: Secondary | ICD-10-CM | POA: Diagnosis not present

## 2022-06-08 LAB — MAGNESIUM
Magnesium: 1.9 mg/dL (ref 1.7–2.4)
Magnesium: 1.5 mg/dL — ABNORMAL LOW (ref 1.7–2.4)
Magnesium: 1.4 mg/dL — ABNORMAL LOW (ref 1.7–2.4)

## 2022-06-08 LAB — BASIC METABOLIC PANEL
Anion gap: 8 (ref 5–15)
BUN: 44 mg/dL — ABNORMAL HIGH (ref 8–23)
CO2: 23 mmol/L (ref 22–32)
Calcium: 7.7 mg/dL — ABNORMAL LOW (ref 8.9–10.3)
Chloride: 110 mmol/L (ref 98–111)
Creatinine, Ser: 1.02 mg/dL (ref 0.61–1.24)
GFR, Estimated: >60 mL/min (ref >60)
Glucose, Bld: 142 mg/dL — ABNORMAL HIGH (ref 70–99)
Potassium: 3.1 mmol/L — ABNORMAL LOW (ref 3.5–5.1)
Sodium: 141 mmol/L (ref 135–145)

## 2022-06-08 LAB — CULTURE, BLOOD (ROUTINE X 2): Special Requests: ADEQUATE

## 2022-06-08 LAB — RAD ONC ARIA SESSION SUMMARY
Course Elapsed Days: 24
Plan Fractions Treated to Date: 17
Plan Prescribed Dose Per Fraction: 2 Gy
Plan Total Fractions Prescribed: 30
Plan Total Prescribed Dose: 60 Gy
Reference Point Dosage Given to Date: 34 Gy
Reference Point Session Dosage Given: 2 Gy
Session Number: 17

## 2022-06-08 LAB — GLUCOSE, CAPILLARY
Glucose-Capillary: 110 mg/dL — ABNORMAL HIGH (ref 70–99)
Glucose-Capillary: 111 mg/dL — ABNORMAL HIGH (ref 70–99)
Glucose-Capillary: 115 mg/dL — ABNORMAL HIGH (ref 70–99)
Glucose-Capillary: 130 mg/dL — ABNORMAL HIGH (ref 70–99)
Glucose-Capillary: 144 mg/dL — ABNORMAL HIGH (ref 70–99)
Glucose-Capillary: 98 mg/dL (ref 70–99)

## 2022-06-08 LAB — CBC
HCT: 24.4 % — ABNORMAL LOW (ref 39.0–52.0)
Hemoglobin: 8.4 g/dL — ABNORMAL LOW (ref 13.0–17.0)
MCH: 31 pg (ref 26.0–34.0)
MCHC: 34.4 g/dL (ref 30.0–36.0)
MCV: 90 fL (ref 80.0–100.0)
Platelets: 56 10*3/uL — ABNORMAL LOW (ref 150–400)
RBC: 2.71 MIL/uL — ABNORMAL LOW (ref 4.22–5.81)
RDW: 15.5 % (ref 11.5–15.5)
WBC: 4.5 10*3/uL (ref 4.0–10.5)
nRBC: 0 % (ref 0.0–0.2)

## 2022-06-08 LAB — LACTIC ACID, PLASMA: Lactic Acid, Venous: 1 mmol/L (ref 0.5–1.9)

## 2022-06-08 LAB — COMPREHENSIVE METABOLIC PANEL
ALT: 33 U/L (ref 0–44)
AST: 38 U/L (ref 15–41)
Albumin: 2.2 g/dL — ABNORMAL LOW (ref 3.5–5.0)
Alkaline Phosphatase: 73 U/L (ref 38–126)
Anion gap: 8 (ref 5–15)
BUN: 44 mg/dL — ABNORMAL HIGH (ref 8–23)
CO2: 21 mmol/L — ABNORMAL LOW (ref 22–32)
Calcium: 7.6 mg/dL — ABNORMAL LOW (ref 8.9–10.3)
Chloride: 108 mmol/L (ref 98–111)
Creatinine, Ser: 0.96 mg/dL (ref 0.61–1.24)
GFR, Estimated: >60 mL/min (ref >60)
Glucose, Bld: 112 mg/dL — ABNORMAL HIGH (ref 70–99)
Potassium: 2.4 mmol/L — CL (ref 3.5–5.1)
Sodium: 137 mmol/L (ref 135–145)
Total Bilirubin: 0.7 mg/dL (ref 0.3–1.2)
Total Protein: 5 g/dL — ABNORMAL LOW (ref 6.5–8.1)

## 2022-06-08 LAB — PHOSPHORUS
Phosphorus: 1.7 mg/dL — ABNORMAL LOW (ref 2.5–4.6)
Phosphorus: 1.5 mg/dL — ABNORMAL LOW (ref 2.5–4.6)

## 2022-06-08 MED ORDER — POTASSIUM PHOSPHATES 15 MMOLE/5ML IV SOLN
30.0000 mmol | Freq: Once | INTRAVENOUS | Status: AC
  Administered 2022-06-08: 30 mmol via INTRAVENOUS
  Filled 2022-06-08: qty 10

## 2022-06-08 MED ORDER — POTASSIUM CHLORIDE 20 MEQ PO PACK
40.0000 meq | PACK | Freq: Once | ORAL | Status: AC
  Administered 2022-06-08: 40 meq
  Filled 2022-06-08: qty 2

## 2022-06-08 MED ORDER — LOPERAMIDE HCL 1 MG/7.5ML PO SUSP
2.0000 mg | Freq: Two times a day (BID) | ORAL | Status: DC
  Administered 2022-06-08 – 2022-06-13 (×11): 2 mg
  Filled 2022-06-08 (×11): qty 15

## 2022-06-08 MED ORDER — POTASSIUM CHLORIDE 10 MEQ/100ML IV SOLN
10.0000 meq | INTRAVENOUS | Status: AC
  Administered 2022-06-08 (×6): 10 meq via INTRAVENOUS
  Filled 2022-06-08 (×6): qty 100

## 2022-06-08 MED ORDER — MIRTAZAPINE 15 MG PO TABS
7.5000 mg | ORAL_TABLET | Freq: Every day | ORAL | Status: DC
  Administered 2022-06-08 – 2022-07-05 (×28): 7.5 mg via ORAL
  Filled 2022-06-08 (×28): qty 1

## 2022-06-08 MED FILL — Dexamethasone Sodium Phosphate Inj 100 MG/10ML: INTRAMUSCULAR | Qty: 1 | Status: AC

## 2022-06-08 NOTE — Progress Notes (Signed)
[facility] for Infectious Disease    Date of Admission:  06/05/2022   Total days of antibiotics 3        Day 2 ceftriaxone          ID: Jesse [name] is a 72 y.o. male with   Principal Problem:   Severe sepsis ([place]) Active Problems:   Dyslipidemia   Essential hypertension   Adenocarcinoma of left lung, stage 2 (HCC)   Malignant neoplasm of lung (HCC)   Hypokalemia   AKI (acute kidney injury) ([place])   Hyponatremia   Acute metabolic encephalopathy   Acute respiratory failure with hypoxia (HCC)   CAP (community acquired pneumonia)   Thrombocytopenia (HCC)   Sepsis ([place])   Diarrhea   Bacteremia due to Klebsiella pneumoniae   Pneumonia of right middle lobe due to Klebsiella pneumoniae ([place])   Drug-induced neutropenia (HCC)   Protein-calorie malnutrition, severe    Subjective: Feels better,  no SOB.   Medications:   Chlorhexidine Gluconate Cloth  6 each Topical Daily   cholestyramine light  4 g Per Tube BID   feeding supplement (PROSource TF20)  60 mL Per Tube Daily   ipratropium-albuterol  3 mL Nebulization TID   sucralfate  1 g Per Tube TID WC & HS   Tbo-filgastrim (GRANIX) SQ  300 mcg Subcutaneous Daily   thiamine  100 mg Per Tube Daily    Objective: Vital signs in last 24 hours: Temp:  [97.5 F (36.4 C)-98.7 F (37.1 C)] 97.6 F (36.4 C) (02/23 0800) Pulse Rate:  [91-127] 109 (02/23 0800) Resp:  [15-24] 17 (02/23 0800) BP: (93-134)/(57-75) 134/64 (02/23 0800) SpO2:  [91 %-97 %] 97 % (02/23 0800) Weight:  [65.7 kg] 65.7 kg (02/23 0703)   General appearance: alert and no distress Resp: diminished breath sounds anterior - bilateral Cardio: regularly irregular rhythm GI: normal findings: bowel sounds normal and soft, non-tender  Lab Results Recent Labs    06/07/22 0324 06/08/22 0537  WBC 1.9* 4.5  HGB 8.4* 8.4*  HCT 25.1* 24.4*  NA 135 137  K 3.2* 2.4*  CL 105 108  CO2 14* 21*  BUN 44* 44*  CREATININE 1.17 0.96   Liver Panel Recent Labs     06/06/22 0035 06/06/22 0603 06/07/22 0324 06/08/22 0537  PROT 4.7*   < > 4.5* 5.0*  ALBUMIN 2.0*   < > 2.4* 2.2*  AST 119*   < > 68* 38  ALT 57*   < > 38 33  ALKPHOS 78   < > 64 73  BILITOT 1.0   < > 1.8* 0.7  BILIDIR 0.6*  --   --   --   IBILI 0.4  --   --   --    < > = values in this interval not displayed.   Sedimentation Rate No results for input(s): "ESRSEDRATE" in the last 72 hours. C-Reactive Protein No results for input(s): "CRP" in the last 72 hours.  Microbiology:  Studies/Results: CT HEAD WO CONTRAST (5MM)  Result Date: 06/07/2022 CLINICAL DATA:  Altered mental status.  Brain Mets suspected. EXAM: CT HEAD WITHOUT CONTRAST TECHNIQUE: Contiguous axial images were obtained from the base of the skull through the vertex without intravenous contrast. RADIATION DOSE REDUCTION: This exam was performed according to the departmental dose-optimization program which includes automated exposure control, adjustment of the mA and/or kV according to patient size and/or use of iterative reconstruction technique. COMPARISON:  MRI Brain 01/12/20 FINDINGS: Brain: No evidence of acute  infarction, hemorrhage, hydrocephalus, extra-axial collection or mass lesion/mass effect. Note the lack of IV contrast and CT technique markedly limits the ability to identify intracranial metastatic lesions. Vascular: No hyperdense vessel or unexpected calcification. Skull: Normal. Negative for fracture or focal lesion. Sinuses/Orbits: No middle ear or mastoid effusion. Paranasal sinuses are clear. Orbits are unremarkable. Other: None. IMPRESSION: 1. No acute intracranial abnormality. 2. Note the lack of IV contrast and CT technique markedly limits the ability to identify intracranial metastatic lesions. Consider follow-up brain MRI with and without contrast if there is high clinical concern for intracranial metastatic disease. Electronically Signed   By: Marin Roberts M.D.   On: 06/07/2022 18:06   DG Abd 1  View  Result Date: 06/07/2022 CLINICAL DATA:  Check gastric catheter placement EXAM: ABDOMEN - 1 VIEW COMPARISON:  None Available. FINDINGS: Weighted feeding catheter is noted in the distal stomach. Scattered large and small bowel gas is noted. No free air is seen. IMPRESSION: Weighted feeding catheter in the distal stomach. Electronically Signed   By: Inez Catalina M.D.   On: 06/07/2022 17:16   DG Abd 1 View  Result Date: 06/07/2022 CLINICAL DATA:  Nasogastric tube placement. EXAM: ABDOMEN - 1 VIEW COMPARISON:  None Available. FINDINGS: Tip of the weighted enteric tube below the diaphragm in the left upper quadrant. This is in the region of the gastric cardia and directed cranially. There is mild gaseous distension of small and large bowel in the abdomen. IMPRESSION: Tip of the weighted enteric tube in the left upper quadrant in the region of the gastric cardia directed cranially. Electronically Signed   By: Keith Rake M.D.   On: 06/07/2022 13:25   DG Chest Port 1 View  Result Date: 06/07/2022 CLINICAL DATA:  Shortness of breath EXAM: PORTABLE CHEST 1 VIEW COMPARISON:  06/05/2022 FINDINGS: Cardiac shadow is stable. Left lung is clear. Increasing right basilar infiltrate is noted. Some generalized increased density in the right hemithorax is seen which may be related to posterior effusion. No bony abnormality is seen. IMPRESSION: Increasing right basilar infiltrate and likely posterior layering effusion. Electronically Signed   By: Inez Catalina M.D.   On: 06/07/2022 01:56     Assessment/Plan: Klebsiella bacteremia Lung Cancer, last CTX 2-11 per family Previous Bladder Cancer AKI HypoK   His WBC has improved. On granix.  Now on RA Await sensi on his Kleb, non-KPC/ESBL. Replete K Cr has normalized.  GI precautions per IP.  Bobby Rumpf [phone] 06/08/2022, 9:25 AM

## 2022-06-08 NOTE — Progress Notes (Signed)
[facility]                                                                              [name], is a 72 y.o. male, DOB - [birth year], [id number] Admit date - 06/05/2022    Outpatient Primary MD for the patient is [name], MD  LOS - 2  days  Chief Complaint  Patient presents with   Weakness       Brief summary   Patient is a 72 year old male with lung CA, currently undergoing chemotherapy, XRT, HTN, HLP, history of bladder cancer, GERD, hypokalemia, COPD presented with severe fatigue and confusion.  Patient was found to be unresponsive on the day of admission, has been generally weak and lethargic with poor p.o. intake.  EMS was called and O2 sats were 97 on room air, however look to cyanotic and patient was placed on 4 L O2.  BP soft in 80s and patient was given normal saline bolus. Per family, had not been acting right for last 4 days prior to admission. Patient was seen at cancer center on 12/19 for chemo appointment and was found to be disoriented and chemo was held.  He was found to have hypokalemia and received IV potassium.  He felt better and went home however started feeling worse again. In ED temp 102 F, heart rate 131, BP 107/72, O2 sats 97% on room air Labs showed sodium 130, potassium 2.6, BUN 53, creatinine 1.85, creatinine was 1.09 on 06/04/2022 CBC showed WBCs 1.1, was 1.9 on 06/04/2022 Flu, RSV, COVID-19 negative Patient also had multiple episodes of diarrhea, C. difficile showed antigen positive but negative toxin  Chest x-ray showed patchy right infrahilar airspace disease, new  Assessment & Plan    Principal Problem: Severe sepsis ([place]), POA likely secondary to right infrahilar pneumonia Neutropenia with fevers Klebsiella pneumonia bacteremia -Patient met criteria with severe sepsis, febrile, tachycardia, hypotensive, AKI, lactic acidosis source likely due to pneumonia -Has received significant IV fluid hydration, cortisol  level 24.6.  Sepsis physiology improved, DC'd IV fluids. -Procalcitonin trending down -Urine strep antigen, urine Legionella antigen negative.  Urine culture showed no growth. -Blood cultures positive for Klebsiella pneumonia, sensitivities pending.  C. difficile PCR negative. 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 Johney Maine on 05/22/2022.  C. difficile antigen positive however negative toxin.  GI pathogen panel negative. -Continue Questran twice daily, started on loperamide twice daily, will change to as needed once diarrhea improves   Pancytopenia with neutropenia, anemia and thrombocytopenia(HCC) -Continue neutropenic precautions, IV Rocephin -Counts improving, on filgrastim daily for 3 doses -Appreciate oncology recommendations, Dr. Julien Nordmann following  Acute metabolic encephalopathy -Patient noted to be somewhat confused, fidgety, had projectile vomiting in the evening yesterday on 2/22.   -No hypercapnia, NH3 normal.  CT head showed no acute intracranial abnormality however due to lack of contrast, limited ability to identify intracranial metastatic lesions -If patient has any further episodes of encephalopathy, seizures, will obtain MRI with and without contrast -Currently alert and oriented x 3, no new FND's, no acute concerns  Acute respiratory failure with hypoxia -Likely due to fluid overload, IV fluids discontinued, received Lasix 40 mg IV x 1 on 2/22 - currently off of O2  Hypotension with history of essential hypertension -BP currently stable, had received IV fluids and IV albumin  -Random cortisol level normal.   -If BP dropping, will add midodrine   Dysphagia/anorexia -Patient has not been eating, states loss of appetite -SLP evaluation on 2/22 recommend dysphagia 3 diet with thin liquids -Diet held yesterday due to episodes of vomiting -Started on slow tube feeds, vomiting has  improved, started on clear liquid diet and advance as tolerated, dietitian following -Will add low-dose Remeron 7.5 mg nightly     Adenocarcinoma of left lung, stage 2 (HCC) neutropenia -Chemotherapy currently on hold, continue IV Rocephin - WBC improving -Resume XRT     Hypokalemia -Potassium 2.4, likely due to diarrhea and Lasix -Continue IV replacement  Hypophosphatemia -K-Phos 30 mmol x 1    AKI (acute kidney injury) ([place]) -Likely due to to severe sepsis, dehydration with poor p.o. intake -Cr 1.85 on admission, baseline 1.09 on 06/04/2022 -Resolved, creatinine 0.96    Hyponatremia -Likely due to poor p.o. intake, resolved with IV fluids   Acute transaminitis -Likely due to #1, hold Lipitor, continue to trend LFTs -Improving    Dyslipidemia -Hold Lipitor, until LFTs improved   Estimated body mass index is 21.39 kg/m as calculated from the following:   Height as of this encounter: '5\' 9"'$  (1.753 m).   Weight as of this encounter: 65.7 kg.  Code Status: Full CODE STATUS DVT Prophylaxis:  SCDs Start: 06/06/22 0153   Level of Care: Level of care: Stepdown Family Communication: Updated patient's wife on the phone today Disposition Plan:      Remains inpatient appropriate: Acute medical conditions preclude any safe discharge.  Continue monitoring in stepdown unit   Procedures:  None  Consultants:   Infectious disease Oncology  Antimicrobials:   Anti-infectives (From admission, onward)    Start     Dose/Rate Route Frequency Ordered Stop   06/07/22 1200  vancomycin (VANCOCIN) IVPB 1000 mg/200 mL premix  Status:  Discontinued        1,000 mg 200 mL/hr over 60 Minutes Intravenous Every 36 hours 06/05/22 2323 06/06/22 1108   06/06/22 1200  cefTRIAXone (ROCEPHIN) 2 g in sodium chloride 0.9 % 100 mL IVPB        2 g 200 mL/hr over 30 Minutes Intravenous Every 24 hours 06/06/22 1110     06/06/22 0200  ceFEPIme (MAXIPIME) 2 g in sodium chloride 0.9 % 100 mL IVPB   Status:  Discontinued        2 g 200 mL/hr over 30 Minutes Intravenous Every 12 hours 06/05/22 2323 06/06/22 1108   06/05/22 2315  vancomycin (VANCOCIN) IVPB 1000 mg/200 mL premix        1,000 mg 200 mL/hr over 60 Minutes Intravenous  Once 06/05/22 2307 06/06/22 0145   06/05/22 1900  cefTRIAXone (ROCEPHIN) 2 g in sodium chloride 0.9 % 100 mL IVPB  Status:  Discontinued        2 g 200 mL/hr over 30 Minutes Intravenous Every 24 hours 06/05/22 1847 06/05/22 2323   06/05/22 1900  azithromycin (ZITHROMAX) 500 mg in sodium chloride 0.9 % 250 mL IVPB  Status:  Discontinued        500 mg 250 mL/hr over 60 Minutes Intravenous Every 24 hours 06/05/22 1847 06/06/22 1108   06/05/22 1830  ceFEPIme (MAXIPIME) 2 g  in sodium chloride 0.9 % 100 mL IVPB  Status:  Discontinued        2 g 200 mL/hr over 30 Minutes Intravenous  Once 06/05/22 1820 06/05/22 1847   06/05/22 1830  metroNIDAZOLE (FLAGYL) IVPB 500 mg  Status:  Discontinued        500 mg 100 mL/hr over 60 Minutes Intravenous  Once 06/05/22 1820 06/05/22 1847   06/05/22 1830  vancomycin (VANCOCIN) IVPB 1000 mg/200 mL premix  Status:  Discontinued        1,000 mg 200 mL/hr over 60 Minutes Intravenous  Once 06/05/22 1820 06/05/22 1826   06/05/22 1830  vancomycin (VANCOREADY) IVPB 1500 mg/300 mL  Status:  Discontinued        1,500 mg 150 mL/hr over 120 Minutes Intravenous  Once 06/05/22 1826 06/05/22 1847          Medications  Chlorhexidine Gluconate Cloth  6 each Topical Daily   ipratropium-albuterol  3 mL Nebulization TID   sucralfate  1 g Oral TID WC & HS   Tbo-filgastrim (GRANIX) SQ  300 mcg Subcutaneous Daily      Subjective:   Jesse [name] was seen and examined today.  Overnight issues noted.  This morning much more alert and oriented, wants to have some ice chips and drink water.  No fevers.  Off of O2.  Diarrhea+, no nausea vomiting or abdominal pain.  Objective:   Vitals:   06/07/22 0800 06/07/22 0845 06/07/22 0900 06/07/22  1000  BP: 112/77  115/61 (!) 102/58  Pulse: 83  (!) 122 (!) 119  Resp: (!) 28  (!) 25 (!) 21  Temp: (!) 97.3 F (36.3 C)     TempSrc: Oral     SpO2: 94% 96% 98% 93%  Weight:      Height:        Intake/Output Summary (Last 24 hours) at 06/07/2022 1057 Last data filed at 06/07/2022 1000 Gross per 24 hour  Intake 3480.39 ml  Output 1060 ml  Net 2420.39 ml     Wt Readings from Last 3 Encounters:  06/06/22 65.7 kg  06/04/22 61.6 kg  05/28/22 64 kg    Physical Exam General: Alert and oriented x 3, NAD, core track+ Cardiovascular: S1 S2 clear, RRR.  Respiratory: Bilateral scattered rhonchi Gastrointestinal: Soft, nontender, nondistended, NBS Ext: no pedal edema bilaterally Neuro: no new deficits Psych: Normal affect     Data Reviewed:  I have personally reviewed following labs    CBC Lab Results  Component Value Date   WBC 1.9 (L) 06/07/2022   RBC 2.69 (L) 06/07/2022   HGB 8.4 (L) 06/07/2022   HCT 25.1 (L) 06/07/2022   MCV 93.3 06/07/2022   MCH 31.2 06/07/2022   PLT 43 (L) 06/07/2022   MCHC 33.5 06/07/2022   RDW 15.2 06/07/2022   LYMPHSABS 0.0 (L) 06/06/2022   MONOABS 0.0 (L) 06/06/2022   EOSABS 0.0 06/06/2022   BASOSABS 0.0 [id number]     Last metabolic panel Lab Results  Component Value Date   NA 135 06/07/2022   K 3.2 (L) 06/07/2022   CL 105 06/07/2022   CO2 14 (L) 06/07/2022   BUN 44 (H) 06/07/2022   CREATININE 1.17 06/07/2022   GLUCOSE 88 06/07/2022   GFRNONAA >60 06/07/2022   GFRAA >60 12/31/2019   CALCIUM 7.7 (L) 06/07/2022   PHOS 2.8 06/06/2022   PROT 4.5 (L) 06/07/2022   ALBUMIN 2.4 (L) 06/07/2022   BILITOT 1.8 (H) 06/07/2022   ALKPHOS 64 06/07/2022  AST 68 (H) 06/07/2022   ALT 38 06/07/2022   ANIONGAP 16 (H) 06/07/2022    CBG (last 3)  Recent Labs    06/07/22 0007 06/07/22 0255 06/07/22 0816  GLUCAP 96 86 78      Coagulation Profile: Recent Labs  Lab 06/06/22 0035  INR 1.1     Radiology Studies: I have personally  reviewed the imaging studies  DG Chest Port 1 View  Result Date: 06/07/2022 CLINICAL DATA:  Shortness of breath EXAM: PORTABLE CHEST 1 VIEW COMPARISON:  06/05/2022 FINDINGS: Cardiac shadow is stable. Left lung is clear. Increasing right basilar infiltrate is noted. Some generalized increased density in the right hemithorax is seen which may be related to posterior effusion. No bony abnormality is seen. IMPRESSION: Increasing right basilar infiltrate and likely posterior layering effusion. Electronically Signed   By: Inez Catalina M.D.   On: 06/07/2022 01:56   DG Chest Port 1 View  Result Date: 06/05/2022 CLINICAL DATA:  Per chart: Pt arrived via EMS, weakness and lethargic since saturday, poor PO intake. Lung cancer on the left side, no nausea, diarrhea is typical for him, had radiation today. EXAM: PORTABLE CHEST - 1 VIEW COMPARISON:  01/29/2020 FINDINGS: Suspect patchy right infrahilar airspace disease. Post partial left pneumonectomy. Heart size and mediastinal contours are within normal limits. No effusion. Right acromioclavicular spurring. IMPRESSION: Patchy right infrahilar airspace disease, new since previous Electronically Signed   By: Lucrezia Europe M.D.   On: 06/05/2022 18:37       Nataliyah Packham M.D. [facility] 06/07/2022, 10:57 AM  Available via Epic secure chat 7am-7pm After 7 pm, please refer to night coverage provider listed on amion.

## 2022-06-08 NOTE — Progress Notes (Signed)
SLP Cancellation Note  Patient Details Name: Jesse [name] MRN: [id number] DOB: 28-Nov-1950   Cancelled treatment:       Reason Eval/Treat Not Completed: (P) Other (comment);Medical issues which prohibited therapy  Pt not feeling well, n/v last night and this am. Note diet changed to clear. Will continue efforts.   Kathleen Lime, MS [facility] SLP Acute Rehab Services Office [phone]    Jesse [name] 06/08/2022, 9:40 AM

## 2022-06-09 DIAGNOSIS — R6520 Severe sepsis without septic shock: Secondary | ICD-10-CM | POA: Diagnosis not present

## 2022-06-09 DIAGNOSIS — A419 Sepsis, unspecified organism: Secondary | ICD-10-CM | POA: Diagnosis not present

## 2022-06-09 LAB — CBC
HCT: 23 % — ABNORMAL LOW (ref 39.0–52.0)
Hemoglobin: 7.7 g/dL — ABNORMAL LOW (ref 13.0–17.0)
MCH: 30.9 pg (ref 26.0–34.0)
MCHC: 33.5 g/dL (ref 30.0–36.0)
MCV: 92.4 fL (ref 80.0–100.0)
Platelets: 58 10*3/uL — ABNORMAL LOW (ref 150–400)
RBC: 2.49 MIL/uL — ABNORMAL LOW (ref 4.22–5.81)
RDW: 15.8 % — ABNORMAL HIGH (ref 11.5–15.5)
WBC: 6.2 10*3/uL (ref 4.0–10.5)
nRBC: 0 % (ref 0.0–0.2)

## 2022-06-09 LAB — COMPREHENSIVE METABOLIC PANEL
ALT: 28 U/L (ref 0–44)
AST: 27 U/L (ref 15–41)
Albumin: 2 g/dL — ABNORMAL LOW (ref 3.5–5.0)
Alkaline Phosphatase: 70 U/L (ref 38–126)
Anion gap: 7 (ref 5–15)
BUN: 37 mg/dL — ABNORMAL HIGH (ref 8–23)
CO2: 23 mmol/L (ref 22–32)
Calcium: 8 mg/dL — ABNORMAL LOW (ref 8.9–10.3)
Chloride: 111 mmol/L (ref 98–111)
Creatinine, Ser: 0.78 mg/dL (ref 0.61–1.24)
GFR, Estimated: >60 mL/min (ref >60)
Glucose, Bld: 148 mg/dL — ABNORMAL HIGH (ref 70–99)
Potassium: 3.6 mmol/L (ref 3.5–5.1)
Sodium: 141 mmol/L (ref 135–145)
Total Bilirubin: 0.6 mg/dL (ref 0.3–1.2)
Total Protein: 4.7 g/dL — ABNORMAL LOW (ref 6.5–8.1)

## 2022-06-09 LAB — GLUCOSE, CAPILLARY
Glucose-Capillary: 129 mg/dL — ABNORMAL HIGH (ref 70–99)
Glucose-Capillary: 131 mg/dL — ABNORMAL HIGH (ref 70–99)
Glucose-Capillary: 142 mg/dL — ABNORMAL HIGH (ref 70–99)
Glucose-Capillary: 142 mg/dL — ABNORMAL HIGH (ref 70–99)
Glucose-Capillary: 143 mg/dL — ABNORMAL HIGH (ref 70–99)
Glucose-Capillary: 155 mg/dL — ABNORMAL HIGH (ref 70–99)

## 2022-06-09 LAB — PROCALCITONIN: Procalcitonin: 1.35 ng/mL

## 2022-06-09 LAB — MAGNESIUM: Magnesium: 2.9 mg/dL — ABNORMAL HIGH (ref 1.7–2.4)

## 2022-06-09 MED ORDER — MAGNESIUM SULFATE 4 GM/100ML IV SOLN
4.0000 g | Freq: Once | INTRAVENOUS | Status: AC
  Administered 2022-06-09: 4 g via INTRAVENOUS
  Filled 2022-06-09: qty 100

## 2022-06-09 NOTE — Plan of Care (Signed)
Noted additional volume given with increase TF and electrolyte replacement. Noted on going care reveals improvement with trending lab results. Problem: Fluid Volume: Goal: Hemodynamic stability will improve Outcome: Progressing   Problem: Fluid Volume: Goal: Hemodynamic stability will improve Outcome: Progressing   Problem: Clinical Measurements: Goal: Diagnostic test results will improve Outcome: Progressing Goal: Signs and symptoms of infection will decrease Outcome: Progressing   Problem: Clinical Measurements: Goal: Diagnostic test results will improve Outcome: Progressing   Problem: Clinical Measurements: Goal: Ability to maintain clinical measurements within normal limits will improve Outcome: Progressing Goal: Will remain free from infection Outcome: Progressing Goal: Diagnostic test results will improve Outcome: Progressing Goal: Respiratory complications will improve Outcome: Progressing Goal: Cardiovascular complication will be avoided Outcome: Progressing   Problem: Clinical Measurements: Goal: Diagnostic test results will improve Outcome: Progressing   Problem: Clinical Measurements: Goal: Cardiovascular complication will be avoided Outcome: Progressing   Problem: Nutrition: Goal: Adequate nutrition will be maintained Outcome: Progressing   Problem: Nutrition: Goal: Adequate nutrition will be maintained Outcome: Progressing

## 2022-06-09 NOTE — Progress Notes (Addendum)
Noted review of trends reveal patients baseline HR 110-117 SR. Noted elevation in HR from 1930 -2100 of 120-130 noted SR.[id number] noted trending decrease in patients HR back to base line with present HR noted at 90 beats/per. Assumed care at 2200 and patient noted general frustration and mild confusion. Pt responds to basic questions asked and is appropriate. VSS, TF INCREASED TO 20CC/HR.Noted Mag level of 1.4 (2148) replaced with 4g/100

## 2022-06-09 NOTE — Progress Notes (Addendum)
PROGRESS NOTE  Jesse [name]  [id number] DOB: 06-07-1950 DOA: 06/05/2022 PCP: [name], MD   Brief Narrative:  Patient is a 72 year old male with history of lung cancer currently under chemo/ radiation, hypertension, history of bladder cancer, COPD who presented with severe fatigue, confusion.  Found to be unresponsive on the day of admission, was having poor oral intake, also having diarrhea..  On presented, he was hypotensive, febrile with temperature of 102 F, tachycardic.  Lab work showed potassium of 2.6, creatinine of 1.8.  Chest x-ray showed patchy right infrahilar airspace disease.  Patient was admitted for management of severe sepsis.  Blood cultures showed Klebsiella pneumonia.  Assessment & Plan:  Principal Problem:   Severe sepsis ([place]) Active Problems:   Dyslipidemia   Essential hypertension   Adenocarcinoma of left lung, stage 2 (HCC)   Malignant neoplasm of lung (HCC)   Hypokalemia   AKI (acute kidney injury) ([place])   Hyponatremia   Acute metabolic encephalopathy   Acute respiratory failure with hypoxia (HCC)   CAP (community acquired pneumonia)   Thrombocytopenia (HCC)   Sepsis ([place])   Diarrhea   Bacteremia due to Klebsiella pneumoniae   Pneumonia of right middle lobe due to Klebsiella pneumoniae ([place])   Drug-induced neutropenia (HCC)   Protein-calorie malnutrition, severe   Severe sepsis/gram-negative bacteremia secondary to pneumonia: Presented with poor oral intake, fever, hypotension, tachycardia, AKI, lactic acidosis.  Blood cultures showed Klebsiella pneumonia.  Currently on ceftriaxone. Sepsis villages improved.  Blood pressure stable.  Procalcitonin trending down.  Urine culture did not show any growth.  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  C. difficile positive but toxin was negative.  GI pathogen panel negative.  Continue Questran, loperamide.On contact precaution  Acute metabolic  encephalopathy: Unresponsive /confused on  presentation.  No hypercapnia, ammonia level normal.  CT head did not show any acute intracranial abnormality.  Currently alert and mostly oriented.  Acute hypoxic respiratory failure: Hospital course also remarkable for fluid overload, IV fluids discontinued, given Lasix.  Currently on room air.  Pancytopenia: On neutropenic precautions.  Improving.  On filgrastim x 3.  Oncology was following.  Hemoglobin in the range of 7, platelets in the range of 50, WBC has normalized.  No evidence of acute blood loss  History of hypertension: Hypotensive on presentation.  Treated with IV fluids, IV albumin.  Random cortisol level normal.  On midodrine.  Blood pressure currently stable  Dysphagia/anorexia: Likely associated with malignancy/chemo.  Speech therapy recommended dysphagia 3 diet.  Also started on slow tube feed.  On Remeron.  Goal is to stop tube feed after adequate oral intake.  Patient wants to remove it.  Will check if he has adequate oral intake before removing the tube  Stage II adenocarcinoma of left lung: Follows with Dr. Mckinley Jewel, on chemotherapy, radiation  Hypokalemia/hypophosphatemia: Currently being monitored and supplemented  AKI: Resolved  Acute transaminitis: Most likely secondary to sepsis.  Lipitor on hold.  Liver enzymes improving.  Hyperlipidemia: Takes Lipitor at home, currently on hold  Deconditioning/weakness: PT/OT recommended SNF on discharge    Nutrition Problem: Severe Malnutrition Etiology: chronic illness (lung cancer currently undergoing chemotherapy and radiation therapy)    DVT prophylaxis:SCDs Start: 06/06/22 0153     Code Status: Full Code  Family Communication: None at bedside  Patient status:Inpatient  Patient is from :Home  Anticipated discharge to:SNF  Estimated DC date:not sure   Consultants: ID,oncology  Procedures:None  Antimicrobials:  Anti-infectives (  From admission, onward)    Start      Dose/Rate Route Frequency Ordered Stop   06/07/22 1200  vancomycin (VANCOCIN) IVPB 1000 mg/200 mL premix  Status:  Discontinued        1,000 mg 200 mL/hr over 60 Minutes Intravenous Every 36 hours 06/05/22 2323 06/06/22 1108   06/06/22 1200  cefTRIAXone (ROCEPHIN) 2 g in sodium chloride 0.9 % 100 mL IVPB        2 g 200 mL/hr over 30 Minutes Intravenous Every 24 hours 06/06/22 1110     06/06/22 0200  ceFEPIme (MAXIPIME) 2 g in sodium chloride 0.9 % 100 mL IVPB  Status:  Discontinued        2 g 200 mL/hr over 30 Minutes Intravenous Every 12 hours 06/05/22 2323 06/06/22 1108   06/05/22 2315  vancomycin (VANCOCIN) IVPB 1000 mg/200 mL premix        1,000 mg 200 mL/hr over 60 Minutes Intravenous  Once 06/05/22 2307 06/06/22 0145   06/05/22 1900  cefTRIAXone (ROCEPHIN) 2 g in sodium chloride 0.9 % 100 mL IVPB  Status:  Discontinued        2 g 200 mL/hr over 30 Minutes Intravenous Every 24 hours 06/05/22 1847 06/05/22 2323   06/05/22 1900  azithromycin (ZITHROMAX) 500 mg in sodium chloride 0.9 % 250 mL IVPB  Status:  Discontinued        500 mg 250 mL/hr over 60 Minutes Intravenous Every 24 hours 06/05/22 1847 06/06/22 1108   06/05/22 1830  ceFEPIme (MAXIPIME) 2 g in sodium chloride 0.9 % 100 mL IVPB  Status:  Discontinued        2 g 200 mL/hr over 30 Minutes Intravenous  Once 06/05/22 1820 06/05/22 1847   06/05/22 1830  metroNIDAZOLE (FLAGYL) IVPB 500 mg  Status:  Discontinued        500 mg 100 mL/hr over 60 Minutes Intravenous  Once 06/05/22 1820 06/05/22 1847   06/05/22 1830  vancomycin (VANCOCIN) IVPB 1000 mg/200 mL premix  Status:  Discontinued        1,000 mg 200 mL/hr over 60 Minutes Intravenous  Once 06/05/22 1820 06/05/22 1826   06/05/22 1830  vancomycin (VANCOREADY) IVPB 1500 mg/300 mL  Status:  Discontinued        1,500 mg 150 mL/hr over 120 Minutes Intravenous  Once 06/05/22 1826 06/05/22 1847       Subjective: Patient seen and examined at bedside today.  Hemodynamically  stable lying in bed.  Overall comfortable but very deconditioned, weak.  Requesting to remove the feeding tube.  Denies any abdomen pain, nausea or vomiting.  No diarrhea this morning.  On room air  Objective: Vitals:   06/09/22 0000 06/09/22 0100 06/09/22 0300 06/09/22 0700  BP: 116/65 119/63  118/63  Pulse: 97 85  (!) 101  Resp: '16 17  17  '$ Temp:   98.6 F (37 C)   TempSrc:   Oral   SpO2: 96% 95%  96%  Weight:      Height:        Intake/Output Summary (Last 24 hours) at 06/09/2022 0736 Last data filed at 06/09/2022 0600 Gross per 24 hour  Intake 2380 ml  Output 1250 ml  Net 1130 ml   Filed Weights   06/06/22 0122 06/08/22 0703  Weight: 65.7 kg 65.7 kg    Examination:  General exam: Overall comfortable, very deconditioned, weak, malnourished HEENT: PERRL Respiratory system:  no wheezes or crackles  Cardiovascular system: S1 & S2  heard, RRR.  Gastrointestinal system: Abdomen is nondistended, soft and nontender. Central nervous system: Alert and oriented to place Extremities: No edema, no clubbing ,no cyanosis Skin: No rashes, no ulcers,no icterus     Data Reviewed: I have personally reviewed following labs and imaging studies  CBC: Recent Labs  Lab 06/04/22 1152 06/04/22 1152 06/05/22 1846 06/06/22 0035 06/06/22 0603 06/06/22 1737 06/07/22 0324 06/08/22 0537 06/09/22 0345  WBC 1.9*   < > 1.1* 0.4* 1.0*  --  1.9* 4.5 6.2  NEUTROABS 1.7  --  0.9* 0.3*  --   --   --   --   --   HGB 9.9*   < > 9.2* 8.3* 7.9* 9.6* 8.4* 8.4* 7.7*  HCT 28.0*  --  26.9* 24.5* 24.0* 29.5* 25.1* 24.4* 23.0*  MCV 88.3  --  90.9 92.1 96.0  --  93.3 90.0 92.4  PLT 85*   < > 59* 47* 38*  --  43* 56* 58*   < > = values in this interval not displayed.   Basic Metabolic Panel: Recent Labs  Lab 06/06/22 0603 06/07/22 0324 06/07/22 1427 06/07/22 1643 06/08/22 0537 06/08/22 1532 06/08/22 2111 06/09/22 0325  NA 131* 135  --   --  137 141  --  141  K 3.0* 3.2*  --   --  2.4* 3.1*  --   3.6  CL 99 105  --   --  108 110  --  111  CO2 22 14*  --   --  21* 23  --  23  GLUCOSE 109* 88  --   --  112* 142*  --  148*  BUN 50* 44*  --   --  44* 44*  --  37*  CREATININE 1.67* 1.17  --   --  0.96 1.02  --  0.78  CALCIUM 7.9* 7.7*  --   --  7.6* 7.7*  --  8.0*  MG 1.7  --  1.7 1.8 1.9 1.5* 1.4*  --   PHOS 2.8  --  1.9* 1.9* 1.7* 1.5*  --   --      Recent Results (from the past 240 hour(s))  Resp panel by RT-PCR (RSV, Flu A&B, Covid) Anterior Nasal Swab     Status: None   Collection Time: 06/05/22  6:20 PM   Specimen: Anterior Nasal Swab  Result Value Ref Range Status   SARS Coronavirus 2 by RT PCR NEGATIVE NEGATIVE Final    Comment: (NOTE) SARS-CoV-2 target nucleic acids are NOT DETECTED.  The SARS-CoV-2 RNA is generally detectable in upper respiratory specimens during the acute phase of infection. The lowest concentration of SARS-CoV-2 viral copies this assay can detect is 138 copies/mL. A negative result does not preclude SARS-Cov-2 infection and should not be used as the sole basis for treatment or other patient management decisions. A negative result may occur with  improper specimen collection/handling, submission of specimen other than nasopharyngeal swab, presence of viral mutation(s) within the areas targeted by this assay, and inadequate number of viral copies(<138 copies/mL). A negative result must be combined with clinical observations, patient history, and epidemiological information. The expected result is Negative.  Fact Sheet for Patients:  EntrepreneurPulse.com.au  Fact Sheet for Healthcare Providers:  IncredibleEmployment.be  This test is no t yet approved or cleared by the [place] FDA and  has been authorized for detection and/or diagnosis of SARS-CoV-2 by FDA under an Emergency Use Authorization (EUA). This EUA will remain  in effect (  meaning this test can be used) for the duration of the COVID-19  declaration under Section 564(b)(1) of the Act, 21 U.S.C.section 360bbb-3(b)(1), unless the authorization is terminated  or revoked sooner.       Influenza A by PCR NEGATIVE NEGATIVE Final   Influenza B by PCR NEGATIVE NEGATIVE Final    Comment: (NOTE) The Xpert Xpress SARS-CoV-2/FLU/RSV plus assay is intended as an aid in the diagnosis of influenza from Nasopharyngeal swab specimens and should not be used as a sole basis for treatment. Nasal washings and aspirates are unacceptable for Xpert Xpress SARS-CoV-2/FLU/RSV testing.  Fact Sheet for Patients: EntrepreneurPulse.com.au  Fact Sheet for Healthcare Providers: IncredibleEmployment.be  This test is not yet approved or cleared by the [place] FDA and has been authorized for detection and/or diagnosis of SARS-CoV-2 by FDA under an Emergency Use Authorization (EUA). This EUA will remain in effect (meaning this test can be used) for the duration of the COVID-19 declaration under Section 564(b)(1) of the Act, 21 U.S.C. section 360bbb-3(b)(1), unless the authorization is terminated or revoked.     Resp Syncytial Virus by PCR NEGATIVE NEGATIVE Final    Comment: (NOTE) Fact Sheet for Patients: EntrepreneurPulse.com.au  Fact Sheet for Healthcare Providers: IncredibleEmployment.be  This test is not yet approved or cleared by the [place] FDA and has been authorized for detection and/or diagnosis of SARS-CoV-2 by FDA under an Emergency Use Authorization (EUA). This EUA will remain in effect (meaning this test can be used) for the duration of the COVID-19 declaration under Section 564(b)(1) of the Act, 21 U.S.C. section 360bbb-3(b)(1), unless the authorization is terminated or revoked.  Performed at [facility], [place] [address], [place], [place] [address]   Blood culture (routine x 2)     Status: Abnormal   Collection Time:  06/05/22  6:46 PM   Specimen: BLOOD  Result Value Ref Range Status   Specimen Description   Final    BLOOD BLOOD RIGHT ARM Performed at [facility] [address], [place], [place] [address]    Special Requests   Final    BOTTLES DRAWN AEROBIC AND ANAEROBIC Blood Culture adequate volume Performed at [facility] [address] [place], [place] [address]    Culture  Setup Time   Final    GRAM NEGATIVE RODS IN BOTH AEROBIC AND ANAEROBIC BOTTLES CRITICAL RESULT CALLED TO, READ BACK BY AND VERIFIED WITH: PHARMD M.BELL AT 1019 ON 06/06/2022 BY T.SAAD. Performed at [facility], [place] [address] [place], [place] [address]    Culture KLEBSIELLA PNEUMONIAE (A)  Final   Report Status 06/08/2022 FINAL  Final   Organism ID, Bacteria KLEBSIELLA PNEUMONIAE  Final      Susceptibility   Klebsiella pneumoniae - MIC*    AMPICILLIN RESISTANT Resistant     CEFEPIME <=0.12 SENSITIVE Sensitive     CEFTAZIDIME <=1 SENSITIVE Sensitive     CEFTRIAXONE <=0.25 SENSITIVE Sensitive     CIPROFLOXACIN <=0.25 SENSITIVE Sensitive     GENTAMICIN <=1 SENSITIVE Sensitive     IMIPENEM <=0.25 SENSITIVE Sensitive     TRIMETH/SULFA <=20 SENSITIVE Sensitive     AMPICILLIN/SULBACTAM 4 SENSITIVE Sensitive     PIP/TAZO <=4 SENSITIVE Sensitive     * KLEBSIELLA PNEUMONIAE  Blood Culture ID Panel (Reflexed)     Status: Abnormal   Collection Time: 06/05/22  6:46 PM  Result Value Ref Range Status   Enterococcus faecalis NOT DETECTED NOT DETECTED Final   Enterococcus Faecium  NOT DETECTED NOT DETECTED Final   Listeria monocytogenes NOT DETECTED NOT DETECTED Final   Staphylococcus species NOT DETECTED NOT DETECTED Final   Staphylococcus aureus (BCID) NOT DETECTED NOT DETECTED Final   Staphylococcus epidermidis NOT DETECTED NOT DETECTED Final   Staphylococcus lugdunensis NOT DETECTED NOT DETECTED Final   Streptococcus species NOT DETECTED NOT DETECTED Final   Streptococcus  agalactiae NOT DETECTED NOT DETECTED Final   Streptococcus pneumoniae NOT DETECTED NOT DETECTED Final   Streptococcus pyogenes NOT DETECTED NOT DETECTED Final   A.calcoaceticus-baumannii NOT DETECTED NOT DETECTED Final   Bacteroides fragilis NOT DETECTED NOT DETECTED Final   Enterobacterales DETECTED (A) NOT DETECTED Final    Comment: Enterobacterales represent a large order of gram negative bacteria, not a single organism. CRITICAL RESULT CALLED TO, READ BACK BY AND VERIFIED WITH: PHARMD M.BELL AT 1019 ON 06/06/2022 BY T.SAAD.    Enterobacter cloacae complex NOT DETECTED NOT DETECTED Final   Escherichia coli NOT DETECTED NOT DETECTED Final   Klebsiella aerogenes NOT DETECTED NOT DETECTED Final   Klebsiella oxytoca NOT DETECTED NOT DETECTED Final   Klebsiella pneumoniae DETECTED (A) NOT DETECTED Final    Comment: CRITICAL RESULT CALLED TO, READ BACK BY AND VERIFIED WITH: PHARMD M.BELL AT 1019 ON 06/06/2022 BY T.SAAD.    Proteus species NOT DETECTED NOT DETECTED Final   Salmonella species NOT DETECTED NOT DETECTED Final   Serratia marcescens NOT DETECTED NOT DETECTED Final   Haemophilus influenzae NOT DETECTED NOT DETECTED Final   Neisseria meningitidis NOT DETECTED NOT DETECTED Final   Pseudomonas aeruginosa NOT DETECTED NOT DETECTED Final   Stenotrophomonas maltophilia NOT DETECTED NOT DETECTED Final   Candida albicans NOT DETECTED NOT DETECTED Final   Candida auris NOT DETECTED NOT DETECTED Final   Candida glabrata NOT DETECTED NOT DETECTED Final   Candida krusei NOT DETECTED NOT DETECTED Final   Candida parapsilosis NOT DETECTED NOT DETECTED Final   Candida tropicalis NOT DETECTED NOT DETECTED Final   Cryptococcus neoformans/gattii NOT DETECTED NOT DETECTED Final   CTX-M ESBL NOT DETECTED NOT DETECTED Final   Carbapenem resistance IMP NOT DETECTED NOT DETECTED Final   Carbapenem resistance KPC NOT DETECTED NOT DETECTED Final   Carbapenem resistance NDM NOT DETECTED NOT DETECTED  Final   Carbapenem resist OXA 48 LIKE NOT DETECTED NOT DETECTED Final   Carbapenem resistance VIM NOT DETECTED NOT DETECTED Final    Comment: Performed at [facility], [address], [place], [place] [address]  Blood culture (routine x 2)     Status: None (Preliminary result)   Collection Time: 06/05/22  7:25 PM   Specimen: BLOOD RIGHT FOREARM  Result Value Ref Range Status   Specimen Description   Final    BLOOD RIGHT FOREARM Performed at [facility], [place] [address], [place], [place] [address]    Special Requests   Final    BOTTLES DRAWN AEROBIC AND ANAEROBIC Blood Culture results may not be optimal due to an excessive volume of blood received in culture bottles Performed at [place] [address], [place], [place] [address]    Culture   Final    NO GROWTH 4 DAYS Performed at [facility], [place] [address] [place], [place] [address]    Report Status PENDING  Incomplete  Urine Culture (for pregnant, neutropenic or urologic patients or patients with an indwelling urinary catheter)     Status: None   Collection Time: 06/05/22 10:58 PM   Specimen: Urine, Clean Catch  Result Value Ref Range Status   Specimen Description   Final    URINE, CLEAN CATCH Performed at [facility], [place] [address], [place], [place] [address]    Special Requests   Final    Immunocompromised Performed at [facility], [place] [address], [place], [place] [address]    Culture   Final    NO GROWTH Performed at [facility], [place] [address] [place], [place] [address]    Report Status 06/07/2022 FINAL  Final  Gastrointestinal Panel by PCR , Stool     Status: None   Collection Time: 06/06/22 12:35 AM   Specimen: Stool  Result Value Ref Range Status   Campylobacter species NOT DETECTED NOT DETECTED Corrected   Plesimonas shigelloides NOT DETECTED NOT DETECTED Corrected   Salmonella species NOT DETECTED NOT DETECTED Corrected    Yersinia enterocolitica NOT DETECTED NOT DETECTED Corrected   Vibrio species NOT DETECTED NOT DETECTED Corrected   Vibrio cholerae NOT DETECTED NOT DETECTED Corrected   Enteroaggregative E coli (EAEC) NOT DETECTED NOT DETECTED Corrected   Enteropathogenic E coli (EPEC) NOT DETECTED NOT DETECTED Corrected   Enterotoxigenic E coli (ETEC) NOT DETECTED NOT DETECTED Corrected   Shiga like toxin producing E coli (STEC) NOT DETECTED NOT DETECTED Corrected   Shigella/Enteroinvasive E coli (EIEC) NOT DETECTED NOT DETECTED Corrected   Cryptosporidium NOT DETECTED NOT DETECTED Corrected   Cyclospora cayetanensis NOT DETECTED NOT DETECTED Corrected   Entamoeba histolytica NOT DETECTED NOT DETECTED Corrected   Giardia lamblia NOT DETECTED NOT DETECTED Corrected   Adenovirus F40/41 NOT DETECTED NOT DETECTED Corrected   Astrovirus NOT DETECTED NOT DETECTED Corrected   Norovirus GI/GII NOT DETECTED NOT DETECTED Corrected   Rotavirus A NOT DETECTED NOT DETECTED Corrected   Sapovirus (I, II, IV, and V) NOT DETECTED NOT DETECTED Corrected    Comment: Performed at [facility], [place] [place], [place] [address]  C Difficile Quick Screen w PCR reflex     Status: Abnormal   Collection Time: 06/06/22 12:35 AM   Specimen: STOOL  Result Value Ref Range Status   C Diff antigen POSITIVE (A) NEGATIVE Final   C Diff toxin NEGATIVE NEGATIVE Final   C Diff interpretation Results are indeterminate. See PCR results.  Final    Comment: Performed at [facility], [place] [address] [place], [place] [address]  MRSA Next Gen by PCR, Nasal     Status: None   Collection Time: 06/06/22 12:35 AM  Result Value Ref Range Status   MRSA by PCR Next Gen NOT DETECTED NOT DETECTED Final    Comment: (NOTE) The GeneXpert MRSA Assay (FDA approved for NASAL specimens only), is one component of a comprehensive MRSA colonization surveillance program. It is not intended to diagnose MRSA infection nor  to guide or monitor treatment for MRSA infections. Test performance is not FDA approved in patients less than 45 years old. Performed at [facility], [place] [address], [place], [place] [address]   C. Diff by PCR, Reflexed     Status: None   Collection Time: 06/06/22 12:35 AM  Result Value Ref Range Status   Toxigenic C. Difficile by PCR NEGATIVE NEGATIVE Final    Comment: Patient is colonized with non toxigenic C. difficile. May not need treatment unless significant symptoms are present. Performed at [facility], [place] [address] [place], [place] [address]   Urine Culture (for pregnant, neutropenic or urologic patients or patients with  an indwelling urinary catheter)     Status: None   Collection Time: 06/06/22  4:44 PM   Specimen: Urine, Clean Catch  Result Value Ref Range Status   Specimen Description   Final    URINE, CLEAN CATCH Performed at [facility], [place] [address] [place], [place] [address]    Special Requests   Final    NONE Performed at [facility], [place] [address], [place], [place] [address]    Culture   Final    NO GROWTH Performed at [facility], [place] [address], [place], [place] [address]    Report Status 06/07/2022 FINAL  Final     Radiology Studies: CT HEAD WO CONTRAST (5MM)  Result Date: 06/07/2022 CLINICAL DATA:  Altered mental status.  Brain Mets suspected. EXAM: CT HEAD WITHOUT CONTRAST TECHNIQUE: Contiguous axial images were obtained from the base of the skull through the vertex without intravenous contrast. RADIATION DOSE REDUCTION: This exam was performed according to the departmental dose-optimization program which includes automated exposure control, adjustment of the mA and/or kV according to patient size and/or use of iterative reconstruction technique. COMPARISON:  MRI Brain 01/12/20 FINDINGS: Brain: No evidence of acute infarction, hemorrhage, hydrocephalus, extra-axial collection or  mass lesion/mass effect. Note the lack of IV contrast and CT technique markedly limits the ability to identify intracranial metastatic lesions. Vascular: No hyperdense vessel or unexpected calcification. Skull: Normal. Negative for fracture or focal lesion. Sinuses/Orbits: No middle ear or mastoid effusion. Paranasal sinuses are clear. Orbits are unremarkable. Other: None. IMPRESSION: 1. No acute intracranial abnormality. 2. Note the lack of IV contrast and CT technique markedly limits the ability to identify intracranial metastatic lesions. Consider follow-up brain MRI with and without contrast if there is high clinical concern for intracranial metastatic disease. Electronically Signed   By: Marin Roberts M.D.   On: 06/07/2022 18:06   DG Abd 1 View  Result Date: 06/07/2022 CLINICAL DATA:  Check gastric catheter placement EXAM: ABDOMEN - 1 VIEW COMPARISON:  None Available. FINDINGS: Weighted feeding catheter is noted in the distal stomach. Scattered large and small bowel gas is noted. No free air is seen. IMPRESSION: Weighted feeding catheter in the distal stomach. Electronically Signed   By: Inez Catalina M.D.   On: 06/07/2022 17:16   DG Abd 1 View  Result Date: 06/07/2022 CLINICAL DATA:  Nasogastric tube placement. EXAM: ABDOMEN - 1 VIEW COMPARISON:  None Available. FINDINGS: Tip of the weighted enteric tube below the diaphragm in the left upper quadrant. This is in the region of the gastric cardia and directed cranially. There is mild gaseous distension of small and large bowel in the abdomen. IMPRESSION: Tip of the weighted enteric tube in the left upper quadrant in the region of the gastric cardia directed cranially. Electronically Signed   By: Keith Rake M.D.   On: 06/07/2022 13:25    Scheduled Meds:  Chlorhexidine Gluconate Cloth  6 each Topical Daily   cholestyramine light  4 g Per Tube BID   feeding supplement (PROSource TF20)  60 mL Per Tube Daily   ipratropium-albuterol  3 mL  Nebulization TID   loperamide HCl  2 mg Per Tube BID   mirtazapine  7.5 mg Oral QHS   sucralfate  1 g Per Tube TID WC & HS   thiamine  100 mg Per Tube Daily   Continuous Infusions:  sodium chloride 10 mL/hr at 06/07/22 0645   cefTRIAXone (ROCEPHIN)  IV Stopped (06/08/22 1308)  feeding supplement (OSMOLITE 1.5 CAL) Stopped (06/07/22 1759)     LOS: 4 days   Shelly Coss, MD [facility] P2/24/2024, 7:36 AM

## 2022-06-10 ENCOUNTER — Inpatient Hospital Stay (HOSPITAL_COMMUNITY): Payer: Medicare Other

## 2022-06-10 DIAGNOSIS — A419 Sepsis, unspecified organism: Secondary | ICD-10-CM | POA: Diagnosis not present

## 2022-06-10 DIAGNOSIS — R6520 Severe sepsis without septic shock: Secondary | ICD-10-CM | POA: Diagnosis not present

## 2022-06-10 LAB — GLUCOSE, CAPILLARY
Glucose-Capillary: 116 mg/dL — ABNORMAL HIGH (ref 70–99)
Glucose-Capillary: 128 mg/dL — ABNORMAL HIGH (ref 70–99)
Glucose-Capillary: 132 mg/dL — ABNORMAL HIGH (ref 70–99)
Glucose-Capillary: 138 mg/dL — ABNORMAL HIGH (ref 70–99)
Glucose-Capillary: 142 mg/dL — ABNORMAL HIGH (ref 70–99)
Glucose-Capillary: 147 mg/dL — ABNORMAL HIGH (ref 70–99)
Glucose-Capillary: 149 mg/dL — ABNORMAL HIGH (ref 70–99)

## 2022-06-10 LAB — CBC
HCT: 22.6 % — ABNORMAL LOW (ref 39.0–52.0)
Hemoglobin: 7.4 g/dL — ABNORMAL LOW (ref 13.0–17.0)
MCH: 31.2 pg (ref 26.0–34.0)
MCHC: 32.7 g/dL (ref 30.0–36.0)
MCV: 95.4 fL (ref 80.0–100.0)
Platelets: 60 10*3/uL — ABNORMAL LOW (ref 150–400)
RBC: 2.37 MIL/uL — ABNORMAL LOW (ref 4.22–5.81)
RDW: 16.2 % — ABNORMAL HIGH (ref 11.5–15.5)
WBC: 4.6 10*3/uL (ref 4.0–10.5)
nRBC: 0 % (ref 0.0–0.2)

## 2022-06-10 LAB — PREPARE RBC (CROSSMATCH)

## 2022-06-10 LAB — BASIC METABOLIC PANEL
Anion gap: 3 — ABNORMAL LOW (ref 5–15)
BUN: 36 mg/dL — ABNORMAL HIGH (ref 8–23)
CO2: 24 mmol/L (ref 22–32)
Calcium: 7.5 mg/dL — ABNORMAL LOW (ref 8.9–10.3)
Chloride: 113 mmol/L — ABNORMAL HIGH (ref 98–111)
Creatinine, Ser: 0.78 mg/dL (ref 0.61–1.24)
GFR, Estimated: >60 mL/min (ref >60)
Glucose, Bld: 152 mg/dL — ABNORMAL HIGH (ref 70–99)
Potassium: 4 mmol/L (ref 3.5–5.1)
Sodium: 140 mmol/L (ref 135–145)

## 2022-06-10 LAB — CULTURE, BLOOD (ROUTINE X 2): Culture: NO GROWTH

## 2022-06-10 LAB — HEMOGLOBIN AND HEMATOCRIT, BLOOD
HCT: 27.5 % — ABNORMAL LOW (ref 39.0–52.0)
Hemoglobin: 9.2 g/dL — ABNORMAL LOW (ref 13.0–17.0)

## 2022-06-10 MED ORDER — SODIUM CHLORIDE 0.9% IV SOLUTION: Freq: Once | INTRAVENOUS | Status: AC

## 2022-06-10 MED ORDER — IPRATROPIUM-ALBUTEROL 0.5-2.5 (3) MG/3ML IN SOLN
3.0000 mL | Freq: Two times a day (BID) | RESPIRATORY_TRACT | Status: DC
  Administered 2022-06-10 – 2022-06-11 (×2): 3 mL via RESPIRATORY_TRACT
  Filled 2022-06-10 (×2): qty 3

## 2022-06-10 NOTE — Progress Notes (Signed)
Noted pt without noted complications through out the night. VSS and patient has been appropriate to questions asked. Small amount of PO intake aprox 150cc.Pt is very irritable and declines to help himself turn , move or reposition. Pt encouraged to aid in his care. AM bath was given at this time.

## 2022-06-10 NOTE — Progress Notes (Signed)
PROGRESS NOTE  Jesse [name]  [id number] DOB: October 17, 1950 DOA: 06/05/2022 PCP: [name], MD   Brief Narrative:  Patient is a 72 year old male with history of lung cancer currently under chemo/ radiation, hypertension, history of bladder cancer, COPD who presented with severe fatigue, confusion.  Found to be unresponsive on the day of admission, was having poor oral intake, also having diarrhea..  On presented, he was hypotensive, febrile with temperature of 102 F, tachycardic.  Lab work showed potassium of 2.6, creatinine of 1.8.  Chest x-ray showed patchy right infrahilar airspace disease.  Patient was admitted for management of severe sepsis.  Blood cultures showed Klebsiella pneumonia.  Hospitalist remarkable for persistent poor oral intake needing feeding tube.  Assessment & Plan:  Principal Problem:   Severe sepsis ([place]) Active Problems:   Dyslipidemia   Essential hypertension   Adenocarcinoma of left lung, stage 2 (HCC)   Malignant neoplasm of lung (HCC)   Hypokalemia   AKI (acute kidney injury) ([place])   Hyponatremia   Acute metabolic encephalopathy   Acute respiratory failure with hypoxia (HCC)   CAP (community acquired pneumonia)   Thrombocytopenia (HCC)   Sepsis ([place])   Diarrhea   Bacteremia due to Klebsiella pneumoniae   Pneumonia of right middle lobe due to Klebsiella pneumoniae ([place])   Drug-induced neutropenia (HCC)   Protein-calorie malnutrition, severe   Severe sepsis/gram-negative bacteremia secondary to pneumonia: Presented with poor oral intake, fever, hypotension, tachycardia, AKI, lactic acidosis.  Blood cultures showed Klebsiella pneumonia.  Currently on ceftriaxone. Sepsis physiology improved.  Blood pressure stable.  Procalcitonin trended down.  Urine culture did not show any growth.  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  C. difficile positive but toxin was negative.  GI pathogen panel negative.   Continue Questran, loperamide.On contact precaution.  Diarrhea has slowed down.  Acute metabolic encephalopathy: Unresponsive /confused on  presentation.  No hypercapnia, ammonia level normal.  CT head did not show any acute intracranial abnormality.  Currently alert and mostly oriented.  Acute hypoxic respiratory failure: Hospital course also remarkable for fluid overload, IV fluids discontinued, given Lasix.  Currently on room air.  Pancytopenia: On neutropenic precautions.  Improving.  On filgrastim x 3.  Oncology was following.  Hemoglobin in the range of 7, platelets in the range of 50, WBC has normalized.  No evidence of acute blood loss.  Status post a unit of PRBC transfusion today.  History of hypertension: Hypotensive on presentation.  Treated with IV fluids, IV albumin.  Random cortisol level normal.  On midodrine.  Blood pressure currently stable  Dysphagia/anorexia: Likely associated with malignancy/chemo.  Speech therapy recommended dysphagia 3 diet.  Also started on slow tube feed.  On Remeron.  Goal is to stop tube feed after adequate oral intake.  Patient wants to remove it.  Will check if he has adequate oral intake before removing the tube, requested dietitian reconsultation  Stage II adenocarcinoma of left lung: Follows with Dr. Mckinley Jewel, on chemotherapy, radiation  Hypokalemia/hypophosphatemia: Currently being monitored and supplemented  AKI: Resolved  Acute transaminitis: Most likely secondary to sepsis.  Lipitor on hold.  Liver enzymes improving.  Hyperlipidemia: Takes Lipitor at home, currently on hold  Deconditioning/weakness: PT/OT recommended SNF on discharge.  TOC following    Nutrition Problem: Severe Malnutrition Etiology: chronic illness (lung cancer currently undergoing chemotherapy and radiation therapy)    DVT prophylaxis:SCDs Start: 06/06/22 0153     Code Status: Full Code  Family  Communication: Called and discussed with wife [name] on phone  2/25  Patient status:Inpatient  Patient is from :Home  Anticipated discharge to:SNF  Estimated DC date:not sure   Consultants: ID,oncology  Procedures:None  Antimicrobials:  Anti-infectives (From admission, onward)    Start     Dose/Rate Route Frequency Ordered Stop   06/07/22 1200  vancomycin (VANCOCIN) IVPB 1000 mg/200 mL premix  Status:  Discontinued        1,000 mg 200 mL/hr over 60 Minutes Intravenous Every 36 hours 06/05/22 2323 06/06/22 1108   06/06/22 1200  cefTRIAXone (ROCEPHIN) 2 g in sodium chloride 0.9 % 100 mL IVPB        2 g 200 mL/hr over 30 Minutes Intravenous Every 24 hours 06/06/22 1110     06/06/22 0200  ceFEPIme (MAXIPIME) 2 g in sodium chloride 0.9 % 100 mL IVPB  Status:  Discontinued        2 g 200 mL/hr over 30 Minutes Intravenous Every 12 hours 06/05/22 2323 06/06/22 1108   06/05/22 2315  vancomycin (VANCOCIN) IVPB 1000 mg/200 mL premix        1,000 mg 200 mL/hr over 60 Minutes Intravenous  Once 06/05/22 2307 06/06/22 0145   06/05/22 1900  cefTRIAXone (ROCEPHIN) 2 g in sodium chloride 0.9 % 100 mL IVPB  Status:  Discontinued        2 g 200 mL/hr over 30 Minutes Intravenous Every 24 hours 06/05/22 1847 06/05/22 2323   06/05/22 1900  azithromycin (ZITHROMAX) 500 mg in sodium chloride 0.9 % 250 mL IVPB  Status:  Discontinued        500 mg 250 mL/hr over 60 Minutes Intravenous Every 24 hours 06/05/22 1847 06/06/22 1108   06/05/22 1830  ceFEPIme (MAXIPIME) 2 g in sodium chloride 0.9 % 100 mL IVPB  Status:  Discontinued        2 g 200 mL/hr over 30 Minutes Intravenous  Once 06/05/22 1820 06/05/22 1847   06/05/22 1830  metroNIDAZOLE (FLAGYL) IVPB 500 mg  Status:  Discontinued        500 mg 100 mL/hr over 60 Minutes Intravenous  Once 06/05/22 1820 06/05/22 1847   06/05/22 1830  vancomycin (VANCOCIN) IVPB 1000 mg/200 mL premix  Status:  Discontinued        1,000 mg 200 mL/hr over 60 Minutes Intravenous  Once 06/05/22 1820 06/05/22 1826   06/05/22 1830   vancomycin (VANCOREADY) IVPB 1500 mg/300 mL  Status:  Discontinued        1,500 mg 150 mL/hr over 120 Minutes Intravenous  Once 06/05/22 1826 06/05/22 1847       Subjective: Patient seen and examined at bedside today.  Hemodynamically stable.  Continues to remain weak, deconditioned, lying in bed.  Alert and awake, mostly oriented.  On room air.  Wants to take the NG tube out.  Denies any new complaints.  Objective: Vitals:   06/10/22 0500 06/10/22 0600 06/10/22 0700 06/10/22 0815  BP: 135/75 (!) 164/125 125/73   Pulse: 95 (!) 101 (!) 102   Resp: 18 (!) 25    Temp:    98.7 F (37.1 C)  TempSrc:    Axillary  SpO2: 97% 96% 96%   Weight:      Height:        Intake/Output Summary (Last 24 hours) at 06/10/2022 0957 Last data filed at 06/09/2022 2000 Gross per 24 hour  Intake 580 ml  Output 750 ml  Net -170 ml   [employer]  06/06/22 0122 06/08/22 0703 06/09/22 0722  Weight: 65.7 kg 65.7 kg 69.2 kg    Examination:  General exam: Overall comfortable, not in distress, weak, deconditioned, noticed HEENT: PERRL,feeding tube Respiratory system:  no wheezes or crackles  Cardiovascular system: S1 & S2 heard, RRR.  Gastrointestinal system: Abdomen is nondistended, soft and nontender. Central nervous system: Alert and mostly oriented Extremities: No edema, no clubbing ,no cyanosis Skin: No rashes, no ulcers,no icterus     Data Reviewed: I have personally reviewed following labs and imaging studies  CBC: Recent Labs  Lab 06/04/22 1152 06/04/22 1152 06/05/22 1846 06/06/22 0035 06/06/22 0603 06/06/22 1737 06/07/22 0324 06/08/22 0537 06/09/22 0345 06/10/22 0256  WBC 1.9*   < > 1.1* 0.4* 1.0*  --  1.9* 4.5 6.2 4.6  NEUTROABS 1.7  --  0.9* 0.3*  --   --   --   --   --   --   HGB 9.9*   < > 9.2* 8.3* 7.9* 9.6* 8.4* 8.4* 7.7* 7.4*  HCT 28.0*  --  26.9* 24.5* 24.0* 29.5* 25.1* 24.4* 23.0* 22.6*  MCV 88.3  --  90.9 92.1 96.0  --  93.3 90.0 92.4 95.4  PLT 85*   < > 59* 47*  38*  --  43* 56* 58* 60*   < > = values in this interval not displayed.   Basic Metabolic Panel: Recent Labs  Lab 06/06/22 0603 06/07/22 0324 06/07/22 1427 06/07/22 1643 06/08/22 0537 06/08/22 1532 06/08/22 2111 06/09/22 0325 06/10/22 0256  NA 131* 135  --   --  137 141  --  141 140  K 3.0* 3.2*  --   --  2.4* 3.1*  --  3.6 4.0  CL 99 105  --   --  108 110  --  111 113*  CO2 22 14*  --   --  21* 23  --  23 24  GLUCOSE 109* 88  --   --  112* 142*  --  148* 152*  BUN 50* 44*  --   --  44* 44*  --  37* 36*  CREATININE 1.67* 1.17  --   --  0.96 1.02  --  0.78 0.78  CALCIUM 7.9* 7.7*  --   --  7.6* 7.7*  --  8.0* 7.5*  MG 1.7  --  1.7 1.8 1.9 1.5* 1.4* 2.9*  --   PHOS 2.8  --  1.9* 1.9* 1.7* 1.5*  --   --   --      Recent Results (from the past 240 hour(s))  Resp panel by RT-PCR (RSV, Flu A&B, Covid) Anterior Nasal Swab     Status: None   Collection Time: 06/05/22  6:20 PM   Specimen: Anterior Nasal Swab  Result Value Ref Range Status   SARS Coronavirus 2 by RT PCR NEGATIVE NEGATIVE Final    Comment: (NOTE) SARS-CoV-2 target nucleic acids are NOT DETECTED.  The SARS-CoV-2 RNA is generally detectable in upper respiratory specimens during the acute phase of infection. The lowest concentration of SARS-CoV-2 viral copies this assay can detect is 138 copies/mL. A negative result does not preclude SARS-Cov-2 infection and should not be used as the sole basis for treatment or other patient management decisions. A negative result may occur with  improper specimen collection/handling, submission of specimen other than nasopharyngeal swab, presence of viral mutation(s) within the areas targeted by this assay, and inadequate number of viral copies(<138 copies/mL). A negative result must be combined with  clinical observations, patient history, and epidemiological information. The expected result is Negative.  Fact Sheet for Patients:   EntrepreneurPulse.com.au  Fact Sheet for Healthcare Providers:  IncredibleEmployment.be  This test is no t yet approved or cleared by the [place] FDA and  has been authorized for detection and/or diagnosis of SARS-CoV-2 by FDA under an Emergency Use Authorization (EUA). This EUA will remain  in effect (meaning this test can be used) for the duration of the COVID-19 declaration under Section 564(b)(1) of the Act, 21 U.S.C.section 360bbb-3(b)(1), unless the authorization is terminated  or revoked sooner.       Influenza A by PCR NEGATIVE NEGATIVE Final   Influenza B by PCR NEGATIVE NEGATIVE Final    Comment: (NOTE) The Xpert Xpress SARS-CoV-2/FLU/RSV plus assay is intended as an aid in the diagnosis of influenza from Nasopharyngeal swab specimens and should not be used as a sole basis for treatment. Nasal washings and aspirates are unacceptable for Xpert Xpress SARS-CoV-2/FLU/RSV testing.  Fact Sheet for Patients: EntrepreneurPulse.com.au  Fact Sheet for Healthcare Providers: IncredibleEmployment.be  This test is not yet approved or cleared by the [place] FDA and has been authorized for detection and/or diagnosis of SARS-CoV-2 by FDA under an Emergency Use Authorization (EUA). This EUA will remain in effect (meaning this test can be used) for the duration of the COVID-19 declaration under Section 564(b)(1) of the Act, 21 U.S.C. section 360bbb-3(b)(1), unless the authorization is terminated or revoked.     Resp Syncytial Virus by PCR NEGATIVE NEGATIVE Final    Comment: (NOTE) Fact Sheet for Patients: EntrepreneurPulse.com.au  Fact Sheet for Healthcare Providers: IncredibleEmployment.be  This test is not yet approved or cleared by the [place] FDA and has been authorized for detection and/or diagnosis of SARS-CoV-2 by FDA under an Emergency Use  Authorization (EUA). This EUA will remain in effect (meaning this test can be used) for the duration of the COVID-19 declaration under Section 564(b)(1) of the Act, 21 U.S.C. section 360bbb-3(b)(1), unless the authorization is terminated or revoked.  Performed at [facility], [place] [address] [place], [place] [address]   Blood culture (routine x 2)     Status: Abnormal   Collection Time: 06/05/22  6:46 PM   Specimen: BLOOD  Result Value Ref Range Status   Specimen Description   Final    BLOOD BLOOD RIGHT ARM Performed at [place] [address], [place], [place] [address]    Special Requests   Final    BOTTLES DRAWN AEROBIC AND ANAEROBIC Blood Culture adequate volume Performed at [place] [address] [place], [place] [address]    Culture  Setup Time   Final    GRAM NEGATIVE RODS IN BOTH AEROBIC AND ANAEROBIC BOTTLES CRITICAL RESULT CALLED TO, READ BACK BY AND VERIFIED WITH: PHARMD M.BELL AT 1019 ON 06/06/2022 BY T.SAAD. Performed at [facility], [place] [address], [place], [place] [address]    Culture KLEBSIELLA PNEUMONIAE (A)  Final   Report Status 06/08/2022 FINAL  Final   Organism ID, Bacteria KLEBSIELLA PNEUMONIAE  Final      Susceptibility   Klebsiella pneumoniae - MIC*    AMPICILLIN RESISTANT Resistant     CEFEPIME <=0.12 SENSITIVE Sensitive     CEFTAZIDIME <=1 SENSITIVE Sensitive     CEFTRIAXONE <=0.25 SENSITIVE Sensitive     CIPROFLOXACIN <=0.25 SENSITIVE Sensitive     GENTAMICIN <=1 SENSITIVE Sensitive     IMIPENEM <=0.25 SENSITIVE Sensitive  TRIMETH/SULFA <=20 SENSITIVE Sensitive     AMPICILLIN/SULBACTAM 4 SENSITIVE Sensitive     PIP/TAZO <=4 SENSITIVE Sensitive     * KLEBSIELLA PNEUMONIAE  Blood Culture ID Panel (Reflexed)     Status: Abnormal   Collection Time: 06/05/22  6:46 PM  Result Value Ref Range Status   Enterococcus faecalis NOT DETECTED NOT DETECTED Final   Enterococcus  Faecium NOT DETECTED NOT DETECTED Final   Listeria monocytogenes NOT DETECTED NOT DETECTED Final   Staphylococcus species NOT DETECTED NOT DETECTED Final   Staphylococcus aureus (BCID) NOT DETECTED NOT DETECTED Final   Staphylococcus epidermidis NOT DETECTED NOT DETECTED Final   Staphylococcus lugdunensis NOT DETECTED NOT DETECTED Final   Streptococcus species NOT DETECTED NOT DETECTED Final   Streptococcus agalactiae NOT DETECTED NOT DETECTED Final   Streptococcus pneumoniae NOT DETECTED NOT DETECTED Final   Streptococcus pyogenes NOT DETECTED NOT DETECTED Final   A.calcoaceticus-baumannii NOT DETECTED NOT DETECTED Final   Bacteroides fragilis NOT DETECTED NOT DETECTED Final   Enterobacterales DETECTED (A) NOT DETECTED Final    Comment: Enterobacterales represent a large order of gram negative bacteria, not a single organism. CRITICAL RESULT CALLED TO, READ BACK BY AND VERIFIED WITH: PHARMD M.BELL AT 1019 ON 06/06/2022 BY T.SAAD.    Enterobacter cloacae complex NOT DETECTED NOT DETECTED Final   Escherichia coli NOT DETECTED NOT DETECTED Final   Klebsiella aerogenes NOT DETECTED NOT DETECTED Final   Klebsiella oxytoca NOT DETECTED NOT DETECTED Final   Klebsiella pneumoniae DETECTED (A) NOT DETECTED Final    Comment: CRITICAL RESULT CALLED TO, READ BACK BY AND VERIFIED WITH: PHARMD M.BELL AT 1019 ON 06/06/2022 BY T.SAAD.    Proteus species NOT DETECTED NOT DETECTED Final   Salmonella species NOT DETECTED NOT DETECTED Final   Serratia marcescens NOT DETECTED NOT DETECTED Final   Haemophilus influenzae NOT DETECTED NOT DETECTED Final   Neisseria meningitidis NOT DETECTED NOT DETECTED Final   Pseudomonas aeruginosa NOT DETECTED NOT DETECTED Final   Stenotrophomonas maltophilia NOT DETECTED NOT DETECTED Final   Candida albicans NOT DETECTED NOT DETECTED Final   Candida auris NOT DETECTED NOT DETECTED Final   Candida glabrata NOT DETECTED NOT DETECTED Final   Candida krusei NOT DETECTED  NOT DETECTED Final   Candida parapsilosis NOT DETECTED NOT DETECTED Final   Candida tropicalis NOT DETECTED NOT DETECTED Final   Cryptococcus neoformans/gattii NOT DETECTED NOT DETECTED Final   CTX-M ESBL NOT DETECTED NOT DETECTED Final   Carbapenem resistance IMP NOT DETECTED NOT DETECTED Final   Carbapenem resistance KPC NOT DETECTED NOT DETECTED Final   Carbapenem resistance NDM NOT DETECTED NOT DETECTED Final   Carbapenem resist OXA 48 LIKE NOT DETECTED NOT DETECTED Final   Carbapenem resistance VIM NOT DETECTED NOT DETECTED Final    Comment: Performed at [facility], [address], [place], [place] [address]  Blood culture (routine x 2)     Status: None   Collection Time: 06/05/22  7:25 PM   Specimen: BLOOD RIGHT FOREARM  Result Value Ref Range Status   Specimen Description   Final    BLOOD RIGHT FOREARM Performed at [facility], [place] [address] [place], [place] [address]    Special Requests   Final    BOTTLES DRAWN AEROBIC AND ANAEROBIC Blood Culture results may not be optimal due to an excessive volume of blood received in culture bottles Performed at [place] [address], [place], [place] [address]    Culture  Final    NO GROWTH 5 DAYS Performed at [facility], [place] [address] [place], [place] [address]    Report Status 06/10/2022 FINAL  Final  Urine Culture (for pregnant, neutropenic or urologic patients or patients with an indwelling urinary catheter)     Status: None   Collection Time: 06/05/22 10:58 PM   Specimen: Urine, Clean Catch  Result Value Ref Range Status   Specimen Description   Final    URINE, CLEAN CATCH Performed at [facility], [place] [address], [place], [place] [address]    Special Requests   Final    Immunocompromised Performed at [facility], [place] [address], [place], [place] [address]    Culture   Final    NO GROWTH Performed at [facility], [place]  [address] [place], [place] [address]    Report Status 06/07/2022 FINAL  Final  Gastrointestinal Panel by PCR , Stool     Status: None   Collection Time: 06/06/22 12:35 AM   Specimen: Stool  Result Value Ref Range Status   Campylobacter species NOT DETECTED NOT DETECTED Corrected   Plesimonas shigelloides NOT DETECTED NOT DETECTED Corrected   Salmonella species NOT DETECTED NOT DETECTED Corrected   Yersinia enterocolitica NOT DETECTED NOT DETECTED Corrected   Vibrio species NOT DETECTED NOT DETECTED Corrected   Vibrio cholerae NOT DETECTED NOT DETECTED Corrected   Enteroaggregative E coli (EAEC) NOT DETECTED NOT DETECTED Corrected   Enteropathogenic E coli (EPEC) NOT DETECTED NOT DETECTED Corrected   Enterotoxigenic E coli (ETEC) NOT DETECTED NOT DETECTED Corrected   Shiga like toxin producing E coli (STEC) NOT DETECTED NOT DETECTED Corrected   Shigella/Enteroinvasive E coli (EIEC) NOT DETECTED NOT DETECTED Corrected   Cryptosporidium NOT DETECTED NOT DETECTED Corrected   Cyclospora cayetanensis NOT DETECTED NOT DETECTED Corrected   Entamoeba histolytica NOT DETECTED NOT DETECTED Corrected   Giardia lamblia NOT DETECTED NOT DETECTED Corrected   Adenovirus F40/41 NOT DETECTED NOT DETECTED Corrected   Astrovirus NOT DETECTED NOT DETECTED Corrected   Norovirus GI/GII NOT DETECTED NOT DETECTED Corrected   Rotavirus A NOT DETECTED NOT DETECTED Corrected   Sapovirus (I, II, IV, and V) NOT DETECTED NOT DETECTED Corrected    Comment: Performed at [facility], [place] [place], [place] [address]  C Difficile Quick Screen w PCR reflex     Status: Abnormal   Collection Time: 06/06/22 12:35 AM   Specimen: STOOL  Result Value Ref Range Status   C Diff antigen POSITIVE (A) NEGATIVE Final   C Diff toxin NEGATIVE NEGATIVE Final   C Diff interpretation Results are indeterminate. See PCR results.  Final    Comment: Performed at [facility], [place] [address],  [place], [place] [address]  MRSA Next Gen by PCR, Nasal     Status: None   Collection Time: 06/06/22 12:35 AM  Result Value Ref Range Status   MRSA by PCR Next Gen NOT DETECTED NOT DETECTED Final    Comment: (NOTE) The GeneXpert MRSA Assay (FDA approved for NASAL specimens only), is one component of a comprehensive MRSA colonization surveillance program. It is not intended to diagnose MRSA infection nor to guide or monitor treatment for MRSA infections. Test performance is not FDA approved in patients less than 35 years old. Performed at [facility], [place] [address] [place], [place] [address]   C. Diff by PCR, Reflexed     Status: None   Collection Time:  06/06/22 12:35 AM  Result Value Ref Range Status   Toxigenic C. Difficile by PCR NEGATIVE NEGATIVE Final    Comment: Patient is colonized with non toxigenic C. difficile. May not need treatment unless significant symptoms are present. Performed at [facility], [place] [address], [place], [place] [address]   Urine Culture (for pregnant, neutropenic or urologic patients or patients with an indwelling urinary catheter)     Status: None   Collection Time: 06/06/22  4:44 PM   Specimen: Urine, Clean Catch  Result Value Ref Range Status   Specimen Description   Final    URINE, CLEAN CATCH Performed at [facility], [place] [address] [place], [place] [address]    Special Requests   Final    NONE Performed at [facility], [place] [address] [place], [place] [address]    Culture   Final    NO GROWTH Performed at [facility], [place] [address], [place], [place] [address]    Report Status 06/07/2022 FINAL  Final     Radiology Studies: No results found.  Scheduled Meds:  sodium chloride   Intravenous Once   Chlorhexidine Gluconate Cloth  6 each Topical Daily   cholestyramine light  4 g Per Tube BID   feeding supplement (PROSource TF20)  60 mL Per Tube Daily    ipratropium-albuterol  3 mL Nebulization BID   loperamide HCl  2 mg Per Tube BID   mirtazapine  7.5 mg Oral QHS   sucralfate  1 g Per Tube TID WC & HS   thiamine  100 mg Per Tube Daily   Continuous Infusions:  sodium chloride 10 mL/hr at 06/07/22 0645   cefTRIAXone (ROCEPHIN)  IV Stopped (06/09/22 1310)   feeding supplement (OSMOLITE 1.5 CAL) Stopped (06/07/22 1759)     LOS: 5 days   Shelly Coss, MD [facility] P2/25/2024, 9:57 AM

## 2022-06-10 NOTE — Progress Notes (Signed)
Patient was in chair laying on Panda tube, tube almost out, re-advanced tube back to 80 @ the nare. [facility] notified, chest ordered for placement verification.

## 2022-06-10 NOTE — Progress Notes (Signed)
Brief Nutrition Follow-Up Note  Received consult to determine if NGT could be removed.   2/22- NGT removed by pt, TF held 2/2 vomiting, NGT replaced (10 french small bore feeding tube- weighted feeding catheter tip in distal stomach per KUB) 2/23- s/po BSE- advanced to dysphagia 3 diet with thin liquids 2/24- TF increased to 20 ml/hr  Per chart review, pt is irritable today. Pt's intake remains very poor. Per RN, pt consumed sips (150 ml last night) and refused breakfast this morning.   -Interventions:  -Continue TF via NGT:  Osmolite 1.5 @ 20 ml/hr and increase by 10 ml every 12 hours to goal rate of 60 ml/hr.   60 ml Prosource TF daily  Tube feeding regimen provides 2240 kcals, 110 grams of protein, and 1097 ml of H2O.    -Continue 100 mg thiamine x 5 days  -Initiate 48 hour calorie count; ideally, continue TF until pt is able to consistently demonstrate meeting >75% of estimated nutritional needs PO  Loistine Chance, RD, LDN, [place] Registered Dietitian II Certified Diabetes Care and Education Specialist Please refer to [facility] for RD and/or RD on-call/weekend/after hours pager

## 2022-06-10 NOTE — Progress Notes (Signed)
Occupational Therapy Treatment Patient Details Name: Jesse [name] MRN: [id number] DOB: [birth year] Today's Date: 06/10/2022   History of present illness 72yo male with significant hx of lung and bladder CA currently on chemo and radiation treatments, who presented with severe fatigue and confusion, cyanosis and soft BPs. Admitted with sepsis from unspecified organism and PNA R lung. PMH CA, COPD, HLD, lung mass, bladder CA, macular degeneration, hx ileostomy, B knee surgery   OT comments  Treatment focused on functional mobility needed to advance ADLs out of bed. Patient mod assist to transfer to edge of bed and mod assist to stand while holding onto walker. +2 assistance for safety and to manage lines. Patient able to take steps to recliner but unable to maintain stand in order to attempt to urinate. Therapist provided patient with ball to squeeze due to patient having some edema in his hands. Encouraged patient to move legs and feet while in recliner. Recommended to patient that he will need rehab at discharge due to weakness.    Recommendations for follow up therapy are one component of a multi-disciplinary discharge planning process, led by the attending physician.  Recommendations may be updated based on patient status, additional functional criteria and insurance authorization.    Follow Up Recommendations  Skilled nursing-short term rehab (<3 hours/day)     Assistance Recommended at Discharge Frequent or constant Supervision/Assistance  Patient can return home with the following  A lot of help with walking and/or transfers;A lot of help with bathing/dressing/bathroom;Assistance with cooking/housework;Direct supervision/assist for medications management;Assist for transportation;Help with stairs or ramp for entrance   Equipment Recommendations  BSC/3in1    Recommendations for Other Services      Precautions / Restrictions Precautions Precautions: Fall;Other (comment) Precaution  Comments: watch O2/HR/BPs Restrictions Weight Bearing Restrictions: No       Mobility Bed Mobility Overal bed mobility: Needs Assistance Bed Mobility: Supine to Sit     Supine to sit: Mod assist, HOB elevated     General bed mobility comments: Mod assist for trunk and to assist wtih legs to transfer to edge of bed    Transfers Overall transfer level: Needs assistance Equipment used: Rolling walker (2 wheels) Transfers: Sit to/from Stand, Bed to chair/wheelchair/BSC Sit to Stand: +2 safety/equipment, Mod assist     Step pivot transfers: Min assist, +2 safety/equipment     General transfer comment: Mod assist to power up with +2 assistance. Min assist with walker to take steps to recliner. Needed assistance for walker management.     Balance   Sitting-balance support: No upper extremity supported, Feet supported, Bilateral upper extremity supported   Sitting balance - Comments: leanign over walker or hands propped on bed   Standing balance support: During functional activity, Reliant on assistive device for balance Standing balance-[name]: Poor                             ADL either performed or assessed with clinical judgement   ADL                                       Functional mobility during ADLs: Minimal assistance;+2 for safety/equipment;Cueing for safety General ADL Comments: Attempted to stand for urinating as he was having difficulty in supine position but unable to tolerate prolonged standing.    Extremity/Trunk Assessment  Vision   Vision Assessment?: No apparent visual deficits   Perception     Praxis      Cognition Arousal/Alertness: Awake/alert Behavior During Therapy: WFL for tasks assessed/performed Overall Cognitive Status: No family/caregiver present to determine baseline cognitive functioning                                          Exercises      Shoulder  Instructions       General Comments      Pertinent Vitals/ Pain       Pain Assessment Pain Assessment: No/denies pain  Home Living                                          Prior Functioning/Environment              Frequency  Min 2X/week        Progress Toward Goals  OT Goals(current goals can now be found in the care plan section)  Progress towards OT goals: Progressing toward goals  Acute Rehab OT Goals Patient Stated Goal: get stronger OT Goal Formulation: With patient Time For Goal Achievement: 06/21/22 Potential to Achieve Goals: [place] Discharge plan remains appropriate    Co-evaluation                 AM-PAC OT "6 Clicks" Daily Activity     Outcome Measure   Help from another person eating meals?: None Help from another person taking care of personal grooming?: A Little Help from another person toileting, which includes using toliet, bedpan, or urinal?: A Lot Help from another person bathing (including washing, rinsing, drying)?: A Lot Help from another person to put on and taking off regular upper body clothing?: A Little Help from another person to put on and taking off regular lower body clothing?: A Lot 6 Click Score: 16    End of Session Equipment Utilized During Treatment: Rolling walker (2 wheels)  OT Visit Diagnosis: Other abnormalities of gait and mobility (R26.89);Unsteadiness on feet (R26.81);Muscle weakness (generalized) (M62.81);Other symptoms and signs involving cognitive function;Pain   Activity Tolerance Patient limited by fatigue   Patient Left with call bell/phone within reach;in chair;with chair alarm set   Nurse Communication Mobility status        Time: [id number] OT Time Calculation (min): 12 min  Charges: OT General Charges $OT Visit: 1 Visit OT Treatments $Therapeutic Activity: 8-22 mins  Gustavo Lah, OTR/L Acute Care Rehab Services  Office [phone]   Lenward Chancellor 06/10/2022, 3:38 PM

## 2022-06-11 ENCOUNTER — Inpatient Hospital Stay: Payer: Medicare Other

## 2022-06-11 ENCOUNTER — Encounter (HOSPITAL_COMMUNITY): Payer: Self-pay | Admitting: Internal Medicine

## 2022-06-11 ENCOUNTER — Inpatient Hospital Stay: Payer: Medicare Other | Admitting: Internal Medicine

## 2022-06-11 ENCOUNTER — Inpatient Hospital Stay (HOSPITAL_COMMUNITY): Payer: Medicare Other

## 2022-06-11 ENCOUNTER — Other Ambulatory Visit: Payer: Self-pay

## 2022-06-11 ENCOUNTER — Ambulatory Visit
Admission: RE | Admit: 2022-06-11 | Discharge: 2022-06-11 | Disposition: A | Discharge status: Home / Self Care | Payer: Medicare Other | Source: Ambulatory Visit | Attending: Radiation Oncology | Admitting: Radiation Oncology

## 2022-06-11 DIAGNOSIS — C771 Secondary and unspecified malignant neoplasm of intrathoracic lymph nodes: Secondary | ICD-10-CM | POA: Diagnosis not present

## 2022-06-11 DIAGNOSIS — F17210 Nicotine dependence, cigarettes, uncomplicated: Secondary | ICD-10-CM | POA: Diagnosis not present

## 2022-06-11 DIAGNOSIS — I639 Cerebral infarction, unspecified: Secondary | ICD-10-CM

## 2022-06-11 DIAGNOSIS — C3492 Malignant neoplasm of unspecified part of left bronchus or lung: Secondary | ICD-10-CM | POA: Diagnosis not present

## 2022-06-11 DIAGNOSIS — Z510 Encounter for antineoplastic radiation therapy: Secondary | ICD-10-CM | POA: Diagnosis not present

## 2022-06-11 DIAGNOSIS — C3412 Malignant neoplasm of upper lobe, left bronchus or lung: Secondary | ICD-10-CM | POA: Diagnosis not present

## 2022-06-11 DIAGNOSIS — R6520 Severe sepsis without septic shock: Secondary | ICD-10-CM | POA: Diagnosis not present

## 2022-06-11 DIAGNOSIS — A419 Sepsis, unspecified organism: Secondary | ICD-10-CM | POA: Diagnosis not present

## 2022-06-11 LAB — RAD ONC ARIA SESSION SUMMARY
Course Elapsed Days: 27
Plan Fractions Treated to Date: 18
Plan Prescribed Dose Per Fraction: 2 Gy
Plan Total Fractions Prescribed: 30
Plan Total Prescribed Dose: 60 Gy
Reference Point Dosage Given to Date: 36 Gy
Reference Point Session Dosage Given: 2 Gy
Session Number: 18

## 2022-06-11 LAB — TYPE AND SCREEN
ABO/RH(D): O POS
Antibody Screen: NEGATIVE
Unit division: 0

## 2022-06-11 LAB — CBC
HCT: 26.2 % — ABNORMAL LOW (ref 39.0–52.0)
Hemoglobin: 8.5 g/dL — ABNORMAL LOW (ref 13.0–17.0)
MCH: 30.6 pg (ref 26.0–34.0)
MCHC: 32.4 g/dL (ref 30.0–36.0)
MCV: 94.2 fL (ref 80.0–100.0)
Platelets: 76 10*3/uL — ABNORMAL LOW (ref 150–400)
RBC: 2.78 MIL/uL — ABNORMAL LOW (ref 4.22–5.81)
RDW: 15.7 % — ABNORMAL HIGH (ref 11.5–15.5)
WBC: 3.9 10*3/uL — ABNORMAL LOW (ref 4.0–10.5)
nRBC: 0 % (ref 0.0–0.2)

## 2022-06-11 LAB — GLUCOSE, CAPILLARY
Glucose-Capillary: 111 mg/dL — ABNORMAL HIGH (ref 70–99)
Glucose-Capillary: 132 mg/dL — ABNORMAL HIGH (ref 70–99)
Glucose-Capillary: 133 mg/dL — ABNORMAL HIGH (ref 70–99)
Glucose-Capillary: 134 mg/dL — ABNORMAL HIGH (ref 70–99)
Glucose-Capillary: 137 mg/dL — ABNORMAL HIGH (ref 70–99)
Glucose-Capillary: 140 mg/dL — ABNORMAL HIGH (ref 70–99)

## 2022-06-11 LAB — BPAM RBC
Blood Product Expiration Date: 202403222359
ISSUE DATE / TIME: 202402251257
Unit Type and Rh: 5100

## 2022-06-11 MED ORDER — ASPIRIN 81 MG PO TBEC: 81.0000 mg | DELAYED_RELEASE_TABLET | Freq: Every day | ORAL | Status: DC

## 2022-06-11 MED ORDER — IOHEXOL 350 MG/ML SOLN
80.0000 mL | Freq: Once | INTRAVENOUS | Status: AC | PRN
  Administered 2022-06-11: 80 mL via INTRAVENOUS

## 2022-06-11 MED ORDER — GADOBUTROL 1 MMOL/ML IV SOLN
6.5000 mL | Freq: Once | INTRAVENOUS | Status: AC | PRN
  Administered 2022-06-11: 6.5 mL via INTRAVENOUS

## 2022-06-11 MED ORDER — AMLODIPINE BESYLATE 5 MG PO TABS
5.0000 mg | ORAL_TABLET | Freq: Every day | ORAL | Status: DC
  Administered 2022-06-11 – 2022-06-12 (×2): 5 mg via ORAL
  Filled 2022-06-11 (×2): qty 1

## 2022-06-11 MED ORDER — ENSURE ENLIVE PO LIQD
237.0000 mL | Freq: Three times a day (TID) | ORAL | Status: DC
  Administered 2022-06-11 – 2022-07-04 (×40): 237 mL via ORAL
  Administered 2022-07-05: 1 via ORAL

## 2022-06-11 MED ORDER — PHENOL 1.4 % MT LIQD
1.0000 | OROMUCOSAL | Status: DC | PRN
  Administered 2022-06-12: 1 via OROMUCOSAL
  Filled 2022-06-11: qty 177

## 2022-06-11 MED ORDER — CLOPIDOGREL BISULFATE 75 MG PO TABS
75.0000 mg | ORAL_TABLET | Freq: Every day | ORAL | Status: DC
  Administered 2022-06-11 – 2022-06-22 (×11): 75 mg
  Filled 2022-06-11 (×12): qty 1

## 2022-06-11 MED ORDER — STROKE: EARLY STAGES OF RECOVERY BOOK
Freq: Once | Status: AC
  Filled 2022-06-11: qty 1

## 2022-06-11 MED ORDER — CLOPIDOGREL BISULFATE 75 MG PO TABS: 75.0000 mg | ORAL_TABLET | Freq: Every day | ORAL | Status: DC

## 2022-06-11 MED ORDER — ASPIRIN 81 MG PO CHEW
81.0000 mg | CHEWABLE_TABLET | Freq: Every day | ORAL | Status: DC
  Administered 2022-06-11 – 2022-06-22 (×11): 81 mg
  Filled 2022-06-11 (×12): qty 1

## 2022-06-11 NOTE — Consult Note (Addendum)
Neurology Consultation  Reason for Consult: Multiple embolic strokes seen on MRI Referring Physician: Dr. Tawanna Solo  CC: None  History is obtained from: Patient, family and chart  HPI: Jesse [name] is a 72 y.o. male with history of lung cancer, currently undergoing chemo and radiation, hypertension, bladder cancer and COPD who initially presented with severe fatigue and confusion and was found to have Klebsiella bacteremia, pneumonia and sepsis.  He was found unresponsive on the day of admission with diarrhea and reduced appetite.  Patient has been treated for this in the ICU, and MRI brain performed today to evaluate for mets reveals multiple small embolic strokes and left hemisphere.  Patient states that he has had no symptoms of unilateral weakness, numbness or speech difficulties.  On exam, left leg is slightly weaker than right, but location of strokes would not explain this finding.  He has had some hoarseness of his voice lately, but this has been attributed to his radiation therapy.  He has mild ataxia to bilateral hands for the last ten days, unsure if this is true ataxia or tremor.   LKW: Unclear, as patient has no symptoms TNK given?: no, outside of window, completed stroke seen on MRI IR Thrombectomy? No, outside of window and exam not consistent with LVO Modified Rankin Scale: 2-Slight disability-UNABLE to perform all activities but does not need assistance (prior to current hospitalization)  ROS: Unable to obtain due to altered mental status.   Past Medical History:  Diagnosis Date   Anemia    Cancer ([place])    bladder cancer   COPD (chronic obstructive pulmonary disease) ([place])    mild    DIVERTICULOSIS, COLON 01/14/2007   Qualifier: Diagnosis of  By: Paulina Fusi RN, [name]    GASTRITIS, CHRONIC 06/01/2004   Qualifier: Diagnosis of  By: Nolon Rod CMA ([place]), Robin     GERD 01/09/2007   Qualifier: Diagnosis of  By: Sherren Mocha, RN, Ellen     Heart murmur    as a child; no murmur  heard 12/14/19   HYPERLIPIDEMIA 01/09/2007   Qualifier: Diagnosis of  By: Sherren Mocha RN, Dorian Pod     Hypertension    Lung mass    left upper nodule   Macular degeneration    right eye   NEOPLASM, MALIGNANT, BLADDER, HX OF 2002   Qualifier: Diagnosis of  By: Nolon Rod CMA (AAMA), Robin  / no chemo or radiation   OSTEOARTHRITIS 01/14/2007   Qualifier: Diagnosis of  By: Paulina Fusi, RN, [name] - knees   Rectal mass    Reflux esophagitis 06/01/2004   Qualifier: Diagnosis of  By: Nolon Rod CMA ([place]), Robin     TOBACCO USER 02/16/2009   Qualifier: Diagnosis of  By: Burnice Logan  MD, Doretha Sou    VITAMIN D DEFICIENCY 06/03/2007   Qualifier: Diagnosis of  By: Burnice Logan  MD, Doretha Sou      Family History  Problem Relation Age of Onset   Dementia Mother    Diabetes Father    Mental illness Father    Peripheral vascular disease Brother      Social History:   reports that he quit smoking about 2 years ago. His smoking use included cigarettes. He has a 20.00 pack-year smoking history. He has never used smokeless tobacco. He reports current alcohol use of about 15.0 standard drinks of alcohol per week. He reports that he does not currently use drugs after having used the following drugs: Marijuana.  Medications  Current Facility-Administered Medications:    [START ON  06/12/2022]  stroke: early stages of recovery book, , Does not apply, Once, de Yolanda Manges, Cortney E, NP   0.9 %  sodium chloride infusion, , Intravenous, PRN, Toy Baker, MD, Last Rate: 10 mL/hr at 06/07/22 0645, New Bag at 06/07/22 0645   acetaminophen (TYLENOL) 160 MG/5ML solution 650 mg, 650 mg, Per Tube, Q6H PRN, 650 mg at 06/08/22 1836 **OR** acetaminophen (TYLENOL) suppository 650 mg, 650 mg, Rectal, Q6H PRN, Nyoka Cowden, Terri L, RPH   amLODipine (NORVASC) tablet 5 mg, 5 mg, Oral, Daily, Adhikari, Amrit, MD, 5 mg at 06/11/22 1237   aspirin EC tablet 81 mg, 81 mg, Oral, Daily, Adhikari, Amrit, MD   cefTRIAXone (ROCEPHIN) 2 g in  sodium chloride 0.9 % 100 mL IVPB, 2 g, Intravenous, Q24H, Campbell Riches, MD, Stopped at 06/11/22 1323   Chlorhexidine Gluconate Cloth 2 % PADS 6 each, 6 each, Topical, Daily, Rai, Ripudeep K, MD, 6 each at 06/10/22 0430   cholestyramine light (PREVALITE) packet 4 g, 4 g, Per Tube, BID, Nyoka Cowden, Terri L, RPH, 4 g at 06/11/22 1238   clopidogrel (PLAVIX) tablet 75 mg, 75 mg, Oral, Daily, de Yolanda Manges, Cortney E, NP   feeding supplement (ENSURE ENLIVE / ENSURE PLUS) liquid 237 mL, 237 mL, Oral, TID BM, Adhikari, Amrit, MD, 237 mL at 06/11/22 1241   feeding supplement (OSMOLITE 1.5 CAL) liquid 1,000 mL, 1,000 mL, Per Tube, Continuous, Rai, Ripudeep K, MD, Last Rate: 60 mL/hr at 06/11/22 0414, 1,000 mL at 06/11/22 0414   feeding supplement (PROSource TF20) liquid 60 mL, 60 mL, Per Tube, Daily, Rai, Ripudeep K, MD, 60 mL at 06/11/22 1238   HYDROcodone-acetaminophen (NORCO/VICODIN) 5-325 MG per tablet 1-2 tablet, 1-2 tablet, Per Tube, Q4H PRN, Minda Ditto, RPH, 2 tablet at 06/09/22 2053   ipratropium-albuterol (DUONEB) 0.5-2.5 (3) MG/3ML nebulizer solution 3 mL, 3 mL, Nebulization, Q6H PRN, Raenette Rover, NP, 3 mL at 06/07/22 0151   loperamide HCl (IMODIUM) 1 MG/7.5ML suspension 2 mg, 2 mg, Per Tube, BID, Rai, Ripudeep K, MD, 2 mg at 06/11/22 1238   mirtazapine (REMERON) tablet 7.5 mg, 7.5 mg, Oral, QHS, Rai, Ripudeep K, MD, 7.5 mg at 06/10/22 2247   Oral care mouth rinse, 15 mL, Mouth Rinse, PRN, Doutova, Anastassia, MD   sucralfate (CARAFATE) 1 GM/10ML suspension 1 g, 1 g, Per Tube, TID WC & HS, Green, Terri L, RPH, 1 g at 06/11/22 1237   thiamine (VITAMIN B1) tablet 100 mg, 100 mg, Per Tube, Daily, Rai, Ripudeep K, MD, 100 mg at 06/11/22 1237   Exam: Current vital signs: BP (!) 156/92   Pulse 100   Temp 97.6 F (36.4 C) (Axillary)   Resp (!) 22   Ht '5\' 9"'$  (1.753 m)   Wt 67.2 kg   SpO2 99%   BMI 21.88 kg/m  Vital signs in last 24 hours: Temp:  [97.6 F (36.4 C)-98.3 F (36.8 C)] 97.6  F (36.4 C) (02/26 1200) Pulse Rate:  [97-111] 100 (02/26 1100) Resp:  [18-25] 22 (02/26 1100) BP: (144-173)/(76-105) 156/92 (02/26 1100) SpO2:  [97 %-100 %] 99 % (02/26 1100) Weight:  [67.2 kg] 67.2 kg (02/26 0956)  GENERAL: Awake, alert, in no acute distress, chronically ill-appearing Psych: Affect appropriate for situation, patient is calm and cooperative with examination Head: Normocephalic and atraumatic, without obvious abnormality EENT: Normal conjunctivae, dry mucous membranes, no OP obstruction LUNGS: Normal respiratory effort. Non-labored breathing on room air CV: Regular rate and rhythm on telemetry Extremities: warm, well perfused, without  obvious deformity  NEURO:  Mental Status: Awake, alert, and oriented to person and place, but disoriented to time He is not able to provide a clear and coherent history of present illness. Speech/Language: speech is clear and fluent but in short phrases.   Naming, repetition, fluency, and comprehension intact without aphasia  No neglect is noted Cranial Nerves:  II: PERRL visual fields full but poor visual acuity noted III, IV, VI: EOMI. Lid elevation symmetric and full.  V: Sensation is intact to light touch and symmetrical to face.  VII: Face is symmetric resting and smiling. VIII: Hearing intact to voice IX, X: Phonation normal.  XI: Normal sternocleidomastoid and trapezius muscle strength XII: Tongue protrudes midline without fasciculations.   Motor: 5/5 strength to bilateral upper extremities, 4+ out of 5 strength to right lower extremity, 4 out of 5 strength to left lower extremity Tone is normal. Bulk is normal.  Sensation: Intact to light touch bilaterally in all four extremities. No extinction to DSS present.  Coordination: FTN intact bilaterally. No pronator drift.  Gait: Deferred  NIHSS: 1a Level of Conscious.: 0 1b LOC Questions: 2 1c LOC Commands: 0 2 Best Gaze: 0 3 Visual: 0 4 Facial Palsy: 0 5a Motor Arm -  left: 0 5b Motor Arm - Right: 0 6a Motor Leg - Left: 0 6b Motor Leg - Right: 0 7 Limb Ataxia: 2 8 Sensory: 0 9 Best Language: 0 10 Dysarthria: 0 11 Extinct. and Inatten.: 0 TOTAL: 4   Labs I have reviewed labs in epic and the results pertinent to this consultation are:   CBC    Component Value Date/Time   WBC 3.9 (L) 06/11/2022 0316   RBC 2.78 (L) 06/11/2022 0316   HGB 8.5 (L) 06/11/2022 0316   HGB 9.9 (L) 06/04/2022 1152   HCT 26.2 (L) 06/11/2022 0316   PLT 76 (L) 06/11/2022 0316   PLT 85 (L) 06/04/2022 1152   MCV 94.2 06/11/2022 0316   MCH 30.6 06/11/2022 0316   MCHC 32.4 06/11/2022 0316   RDW 15.7 (H) 06/11/2022 0316   LYMPHSABS 0.0 (L) 06/06/2022 0035   MONOABS 0.0 (L) 06/06/2022 0035   EOSABS 0.0 06/06/2022 0035   BASOSABS 0.0 06/06/2022 0035    CMP     Component Value Date/Time   NA 140 06/10/2022 0256   K 4.0 06/10/2022 0256   CL 113 (H) 06/10/2022 0256   CO2 24 06/10/2022 0256   GLUCOSE 152 (H) 06/10/2022 0256   BUN 36 (H) 06/10/2022 0256   CREATININE 0.78 06/10/2022 0256   CREATININE 1.09 06/04/2022 1152   CALCIUM 7.5 (L) 06/10/2022 0256   PROT 4.7 (L) 06/09/2022 0325   ALBUMIN 2.0 (L) 06/09/2022 0325   AST 27 06/09/2022 0325   AST 83 (H) 06/04/2022 1152   ALT 28 06/09/2022 0325   ALT 46 (H) 06/04/2022 1152   ALKPHOS 70 06/09/2022 0325   BILITOT 0.6 06/09/2022 0325   BILITOT 0.8 06/04/2022 1152   GFRNONAA >60 06/10/2022 0256   GFRNONAA >60 06/04/2022 1152   GFRAA >60 12/31/2019 1428    Lipid Panel     Component Value Date/Time   CHOL 138 10/25/2021 0851   TRIG 74.0 10/25/2021 0851   HDL 50.90 10/25/2021 0851   CHOLHDL 3 10/25/2021 0851   VLDL 14.8 10/25/2021 0851   LDLCALC 72 10/25/2021 0851   LDLDIRECT 102.0 10/22/2018 0838     Imaging I have reviewed the images obtained:  CT-scan of the brain: No acute abnormality  MRI examination of the brain: Scattered small acute infarcts in left frontal cortex, right occipital cortex and  bilateral cerebellar hemispheres  Assessment: 72 year old patient with history of lung cancer currently under treatment, hypertension, bladder cancer and COPD initially presented with severe fatigue confusion and unresponsiveness and was found to have sepsis due to pneumonia.  MRI brain was performed to evaluate for mets, and multiple small embolic strokes were noted.  Patient states he has not had any symptoms of unilateral weakness or numbness or speech difficulties.  On exam, slight ataxia versus tremor of bilateral hands was noted, as well as slight weakness of left lower extremity.  Strokes appear embolic in nature on MRI, and may be due to hypercoagulability from cancer, will start DAPT today and consider anticoagulation after consultation with primary team and oncology if embolic source is found.  Impression: Small acute embolic ischemic strokes  Recommendations: - Likely outside of permissive hypertension window - CTA head and neck - TTE w/ bubble - Check A1c and LDL + add statin per guidelines - Aspirin 81 mg daily and Plavix 75 mg daily  - STAT head CT for any change in neuro exam - Tele - PT/OT/SLP - Stroke education - Amb referral to neurology upon discharge    Pt seen by NP/Neuro and later by MD. Note/plan to be edited by MD as needed.  Hazen , MSN, AGACNP-BC [facility] See Amion for schedule and pager information 06/11/2022 2:16 PM   Attending Neurohospitalist Addendum Patient seen and examined with APP/Resident. Agree with the history and physical as documented above. Agree with the plan as documented, which I helped formulate. I have edited the note above to reflect my full findings and recommendations. I have independently reviewed the chart, obtained history, review of systems and examined the patient.I have personally reviewed pertinent head/neck/spine imaging (CT/MRI). Please feel free to call with any questions.  -- Su Monks,  MD [facility] [phone]  If 7pm- 7am, please page neurology on call as listed in [place].

## 2022-06-11 NOTE — Progress Notes (Addendum)
Nutrition Follow-up  DOCUMENTATION CODES:   Severe malnutrition in context of chronic illness  INTERVENTION:   -Ensure Plus High Protein po TID, each supplement provides 350 kcal and 20 grams of protein. (Pt's wife to bring Ensure Complete from home, provides 350 kcals and 30g protein per bottle). Provide EHP from unit if Ensure from home not available.  Monitor magnesium, potassium, and phosphorus for at least 3 days, MD to replete as needed, as pt is at VERY high risk for refeeding syndrome given severe malnutrition.   Continue TF via NGT: -Osmolite 1.5 @ 20 ml/hr and increase by 10 ml every 12 hours to goal rate of 60 ml/hr.  - 60 ml Prosource TF daily -Tube feeding regimen provides 2240 kcals, 110 grams of protein, and 1097 ml of H2O.     -Continue 100 mg thiamine x 5 days -continue until 2/27  -Calorie Count -insubstantial data available as pt refusing most meals. Recommend continue tube feeding at this time.    NUTRITION DIAGNOSIS:   Severe Malnutrition related to chronic illness (lung cancer currently undergoing chemotherapy and radiation therapy) as evidenced by severe fat depletion, severe muscle depletion, percent weight loss (15% in 6 months).  Ongoing.  GOAL:   Patient will meet greater than or equal to 90% of their needs  Progressing with TF  MONITOR:   PO intake, Diet advancement, Labs, Weight trends, TF tolerance, GOC  REASON FOR ASSESSMENT:   Consult Assessment of nutrition requirement/status, Calorie Count  ASSESSMENT:   72 y.o. male with medical history significant of lung cancer currently undergoing chemotherapy and radiation therapy, HTN, HLD, history of bladder cancer, GERD, hypokalemia, recurrent anemia, COPD, macular degeneration who presented with severe fatigue and confusion.  2/20 -admitted 2/22- NGT removed by pt, TF held 2/2 vomiting, NGT replaced (10 french small bore feeding tube- weighted feeding catheter tip in distal stomach per  KUB) 2/23- s/po BSE- advanced to dysphagia 3 diet with thin liquids 2/24- TF increased to 20 ml/hr  Patient 's NGT had to be re-advanced 2/25, abdominal x-ray confirms tube tip is located within the stomach.  Per pt's wife, pt not eating anything and hasn't been eating anything for weeks. Mainly drinking water. He does like milk so patient states he is willing to drink milk and protein shakes at this time. Unless goals of care changes, pt will need to drink protein supplements in order to have tube feeding stopped. Recommend continue at this time. Noted palliative care has been consulted.  Tube feeding was off during visit. Pt just returned from radiation. About to have MRI so tube feeds can be resumed after return.   Noted SLP to see patient. Pt's wife states he has had trouble with swallowing d/t radiation.   Admission weight: 144 lbs Current weight: 148 lbs  Medications: Imodium, Remeron, Carafate, Thiamine  Labs reviewed: CBGs: 116-147  Diet Order:   Diet Order             DIET DYS 3 Room service appropriate? No; Fluid consistency: Thin  Diet effective now                   EDUCATION NEEDS:   Education needs have been addressed  Skin:  Skin Assessment: Reviewed RN Assessment  Last BM:  2/25 -type 6  Height:   Ht Readings from Last 1 Encounters:  06/06/22 '5\' 9"'$  (1.753 m)    Weight:   Wt Readings from Last 1 Encounters:  06/11/22 67.2 kg  BMI:  Body mass index is 21.88 kg/m.  Estimated Nutritional Needs:   Kcal:  1950-2300 kcals  Protein:  110-130 grams  Fluid:  >/= 1.9L  Clayton Bibles, MS, RD, LDN Inpatient Clinical Dietitian Contact information available via Amion

## 2022-06-11 NOTE — Progress Notes (Signed)
SLP Cancellation Note  Patient Details Name: Jesse [name] MRN: [id number] DOB: 11/29/1950   Cancelled treatment:       Reason Eval/Treat Not Completed: Patient at procedure or test/unavailable. SLP will attempt f/u later today or next date schedule permitting. Per RN, patient's PO intake is very limited and when he swallows he winces in discomfort.  Sonia Baller, MA, CCC-SLP Speech Therapy

## 2022-06-11 NOTE — Progress Notes (Signed)
PROGRESS NOTE  Jesse [name]  [id number] DOB: [birth year] DOA: 06/05/2022 PCP: [name], MD   Brief Narrative:  Patient is a 72 year old male with history of lung cancer currently under chemo/ radiation, hypertension, history of bladder cancer, COPD who presented with severe fatigue, confusion.  Found to be unresponsive on the day of admission, was having poor oral intake, also having diarrhea. On presentation, he was hypotensive, febrile with temperature of 102 F, tachycardic.  Lab work showed potassium of 2.6, creatinine of 1.8.  Chest x-ray showed patchy right infrahilar airspace disease.  Patient was admitted for management of severe sepsis.  Blood cultures showed Klebsiella pneumonia.  Hospital course remarkable for persistent poor oral intake needing feeding tube.  Remains very weak.  Palliative care also consulted for goals of care  Assessment & Plan:  Principal Problem:   Severe sepsis ([place]) Active Problems:   Dyslipidemia   Essential hypertension   Adenocarcinoma of left lung, stage 2 (HCC)   Malignant neoplasm of lung (HCC)   Hypokalemia   AKI (acute kidney injury) ([place])   Hyponatremia   Acute metabolic encephalopathy   Acute respiratory failure with hypoxia (HCC)   CAP (community acquired pneumonia)   Thrombocytopenia (HCC)   Sepsis ([place])   Diarrhea   Bacteremia due to Klebsiella pneumoniae   Pneumonia of right middle lobe due to Klebsiella pneumoniae ([place])   Drug-induced neutropenia (HCC)   Protein-calorie malnutrition, severe   Severe sepsis/gram-negative bacteremia secondary to pneumonia: Presented with poor oral intake, fever, hypotension, tachycardia, AKI, lactic acidosis.  Blood cultures showed Klebsiella pneumonia.  Currently on ceftriaxone. Sepsis physiology improved.  Blood pressure stable.  Procalcitonin trended down.  Urine culture did not show any growth.  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   C. difficile positive but toxin was negative.  GI pathogen panel negative.  Continue Questran, loperamide.On contact precaution.  Diarrhea has stopped  Acute metabolic encephalopathy: Unresponsive /confused on  presentation.  No hypercapnia, ammonia level normal.  CT head did not show any acute intracranial abnormality.  Remains confused.  Most likely aspect of delirium.  Will also do MRI of the brain  Acute hypoxic respiratory failure: Hospital course also remarkable for fluid overload, IV fluids discontinued, given Lasix.  Currently on room air.  Pancytopenia: On neutropenic precautions.  Improving.  On filgrastim x 3.  Oncology was following.  Hemoglobin in the range of 8, platelets in the range of 70.  No evidence of acute blood loss.  Status post a unit of PRBC transfusion on 2/25.  Currently hemoglobin stable  History of hypertension: Initially hypotensive but now blood pressure has trended up.  Started on home amlodipine  Dysphagia/anorexia: Likely associated with malignancy/chemo.  Speech therapy recommended dysphagia 3 diet.  Also started on slow tube feed.  On Remeron.  Goal is to stop tube feed after adequate oral intake.  Patient wants to remove it.  Dietitian following.  He is having very poor intake so tube will remain in place for now  Stage II adenocarcinoma of left lung: Follows with Dr. Mckinley Jewel, on chemotherapy, radiation.  Radiation treatment being continued in this hospitalization.  I have requested Dr Julien Nordmann  for follow-up  AKI: Resolved  Hyperlipidemia: Takes Lipitor at home  Deconditioning/weakness: PT/OT recommended SNF on discharge.  TOC following  Goals of care: Patient with history of lung cancer now with very poor oral intake, severe weakness.  Poor quality of life.  On tube feeding.  I have requested palliative care consultation after discussion with wife     Nutrition Problem: Severe Malnutrition Etiology: chronic illness (lung cancer currently undergoing  chemotherapy and radiation therapy)    DVT prophylaxis:SCDs Start: 06/06/22 0153     Code Status: Full Code  Family Communication: Called and discussed with wife [name] on phone 2/26  Patient status:Inpatient  Patient is from :Home  Anticipated discharge to:SNF?   Estimated DC date:not sure   Consultants: ID,oncology, palliative care  Procedures:None  Antimicrobials:  Anti-infectives (From admission, onward)    Start     Dose/Rate Route Frequency Ordered Stop   06/07/22 1200  vancomycin (VANCOCIN) IVPB 1000 mg/200 mL premix  Status:  Discontinued        1,000 mg 200 mL/hr over 60 Minutes Intravenous Every 36 hours 06/05/22 2323 06/06/22 1108   06/06/22 1200  cefTRIAXone (ROCEPHIN) 2 g in sodium chloride 0.9 % 100 mL IVPB        2 g 200 mL/hr over 30 Minutes Intravenous Every 24 hours 06/06/22 1110     06/06/22 0200  ceFEPIme (MAXIPIME) 2 g in sodium chloride 0.9 % 100 mL IVPB  Status:  Discontinued        2 g 200 mL/hr over 30 Minutes Intravenous Every 12 hours 06/05/22 2323 06/06/22 1108   06/05/22 2315  vancomycin (VANCOCIN) IVPB 1000 mg/200 mL premix        1,000 mg 200 mL/hr over 60 Minutes Intravenous  Once 06/05/22 2307 06/06/22 0145   06/05/22 1900  cefTRIAXone (ROCEPHIN) 2 g in sodium chloride 0.9 % 100 mL IVPB  Status:  Discontinued        2 g 200 mL/hr over 30 Minutes Intravenous Every 24 hours 06/05/22 1847 06/05/22 2323   06/05/22 1900  azithromycin (ZITHROMAX) 500 mg in sodium chloride 0.9 % 250 mL IVPB  Status:  Discontinued        500 mg 250 mL/hr over 60 Minutes Intravenous Every 24 hours 06/05/22 1847 06/06/22 1108   06/05/22 1830  ceFEPIme (MAXIPIME) 2 g in sodium chloride 0.9 % 100 mL IVPB  Status:  Discontinued        2 g 200 mL/hr over 30 Minutes Intravenous  Once 06/05/22 1820 06/05/22 1847   06/05/22 1830  metroNIDAZOLE (FLAGYL) IVPB 500 mg  Status:  Discontinued        500 mg 100 mL/hr over 60 Minutes Intravenous  Once 06/05/22 1820 06/05/22 1847    06/05/22 1830  vancomycin (VANCOCIN) IVPB 1000 mg/200 mL premix  Status:  Discontinued        1,000 mg 200 mL/hr over 60 Minutes Intravenous  Once 06/05/22 1820 06/05/22 1826   06/05/22 1830  vancomycin (VANCOREADY) IVPB 1500 mg/300 mL  Status:  Discontinued        1,500 mg 150 mL/hr over 120 Minutes Intravenous  Once 06/05/22 1826 06/05/22 1847       Subjective: Patient seen and examined at bedside today.  Hemodynamically stable, mildly hypertensive today.  Remains very weak, deconditioned, lying in bed.  Continues to have poor oral intake.  Denies new complaints.  Not in distress  Objective: Vitals:   06/11/22 0600 06/11/22 0700 06/11/22 0800 06/11/22 0900  BP: (!) 158/91 (!) 155/87 (!) 173/84 (!) 167/105  Pulse: 97 (!) 104 98 (!) 104  Resp: (!) '21 20 20 20  '$ Temp:   97.8 F (36.6 C)   TempSrc:   Oral   SpO2: 98% 98% 99% 100%  Weight:  Height:        Intake/Output Summary (Last 24 hours) at 06/11/2022 1116 Last data filed at 06/11/2022 1000 Gross per 24 hour  Intake 734 ml  Output 1725 ml  Net -991 ml   Filed Weights   06/09/22 0722 06/10/22 0705 06/11/22 0500  Weight: 69.2 kg 65.2 kg 67.2 kg    Examination:  General exam: Not in distress,weak, deconditioned HEENT: PERRL,feeding tube Respiratory system:  no wheezes or crackles  Cardiovascular system: S1 & S2 heard, RRR.  Gastrointestinal system: Abdomen is nondistended, soft and nontender. Central nervous system: Alert and awake but not oriented to time Extremities: No edema, no clubbing ,no cyanosis Skin: No rashes, no ulcers,no icterus     Data Reviewed: I have personally reviewed following labs and imaging studies  CBC: Recent Labs  Lab 06/04/22 1152 06/04/22 1152 06/05/22 1846 06/06/22 0035 06/06/22 0603 06/07/22 0324 06/08/22 0537 06/09/22 0345 06/10/22 0256 06/10/22 1812 06/11/22 0316  WBC 1.9*   < > 1.1* 0.4*   < > 1.9* 4.5 6.2 4.6  --  3.9*  NEUTROABS 1.7  --  0.9* 0.3*  --   --   --    --   --   --   --   HGB 9.9*   < > 9.2* 8.3*   < > 8.4* 8.4* 7.7* 7.4* 9.2* 8.5*  HCT 28.0*  --  26.9* 24.5*   < > 25.1* 24.4* 23.0* 22.6* 27.5* 26.2*  MCV 88.3  --  90.9 92.1   < > 93.3 90.0 92.4 95.4  --  94.2  PLT 85*   < > 59* 47*   < > 43* 56* 58* 60*  --  76*   < > = values in this interval not displayed.   Basic Metabolic Panel: Recent Labs  Lab 06/06/22 0603 06/07/22 0324 06/07/22 1427 06/07/22 1643 06/08/22 0537 06/08/22 1532 06/08/22 2111 06/09/22 0325 06/10/22 0256  NA 131* 135  --   --  137 141  --  141 140  K 3.0* 3.2*  --   --  2.4* 3.1*  --  3.6 4.0  CL 99 105  --   --  108 110  --  111 113*  CO2 22 14*  --   --  21* 23  --  23 24  GLUCOSE 109* 88  --   --  112* 142*  --  148* 152*  BUN 50* 44*  --   --  44* 44*  --  37* 36*  CREATININE 1.67* 1.17  --   --  0.96 1.02  --  0.78 0.78  CALCIUM 7.9* 7.7*  --   --  7.6* 7.7*  --  8.0* 7.5*  MG 1.7  --  1.7 1.8 1.9 1.5* 1.4* 2.9*  --   PHOS 2.8  --  1.9* 1.9* 1.7* 1.5*  --   --   --      Recent Results (from the past 240 hour(s))  Resp panel by RT-PCR (RSV, Flu A&B, Covid) Anterior Nasal Swab     Status: None   Collection Time: 06/05/22  6:20 PM   Specimen: Anterior Nasal Swab  Result Value Ref Range Status   SARS Coronavirus 2 by RT PCR NEGATIVE NEGATIVE Final    Comment: (NOTE) SARS-CoV-2 target nucleic acids are NOT DETECTED.  The SARS-CoV-2 RNA is generally detectable in upper respiratory specimens during the acute phase of infection. The lowest concentration of SARS-CoV-2 viral copies this assay can detect  is 138 copies/mL. A negative result does not preclude SARS-Cov-2 infection and should not be used as the sole basis for treatment or other patient management decisions. A negative result may occur with  improper specimen collection/handling, submission of specimen other than nasopharyngeal swab, presence of viral mutation(s) within the areas targeted by this assay, and inadequate number of  viral copies(<138 copies/mL). A negative result must be combined with clinical observations, patient history, and epidemiological information. The expected result is Negative.  Fact Sheet for Patients:  EntrepreneurPulse.com.au  Fact Sheet for Healthcare Providers:  IncredibleEmployment.be  This test is no t yet approved or cleared by the [place] FDA and  has been authorized for detection and/or diagnosis of SARS-CoV-2 by FDA under an Emergency Use Authorization (EUA). This EUA will remain  in effect (meaning this test can be used) for the duration of the COVID-19 declaration under Section 564(b)(1) of the Act, 21 U.S.C.section 360bbb-3(b)(1), unless the authorization is terminated  or revoked sooner.       Influenza A by PCR NEGATIVE NEGATIVE Final   Influenza B by PCR NEGATIVE NEGATIVE Final    Comment: (NOTE) The Xpert Xpress SARS-CoV-2/FLU/RSV plus assay is intended as an aid in the diagnosis of influenza from Nasopharyngeal swab specimens and should not be used as a sole basis for treatment. Nasal washings and aspirates are unacceptable for Xpert Xpress SARS-CoV-2/FLU/RSV testing.  Fact Sheet for Patients: EntrepreneurPulse.com.au  Fact Sheet for Healthcare Providers: IncredibleEmployment.be  This test is not yet approved or cleared by the [place] FDA and has been authorized for detection and/or diagnosis of SARS-CoV-2 by FDA under an Emergency Use Authorization (EUA). This EUA will remain in effect (meaning this test can be used) for the duration of the COVID-19 declaration under Section 564(b)(1) of the Act, 21 U.S.C. section 360bbb-3(b)(1), unless the authorization is terminated or revoked.     Resp Syncytial Virus by PCR NEGATIVE NEGATIVE Final    Comment: (NOTE) Fact Sheet for Patients: EntrepreneurPulse.com.au  Fact Sheet for Healthcare  Providers: IncredibleEmployment.be  This test is not yet approved or cleared by the [place] FDA and has been authorized for detection and/or diagnosis of SARS-CoV-2 by FDA under an Emergency Use Authorization (EUA). This EUA will remain in effect (meaning this test can be used) for the duration of the COVID-19 declaration under Section 564(b)(1) of the Act, 21 U.S.C. section 360bbb-3(b)(1), unless the authorization is terminated or revoked.  Performed at [facility], [place] [address] [place], [place] [address]   Blood culture (routine x 2)     Status: Abnormal   Collection Time: 06/05/22  6:46 PM   Specimen: BLOOD  Result Value Ref Range Status   Specimen Description   Final    BLOOD BLOOD RIGHT ARM Performed at [place] [address] [place], [place] [address]    Special Requests   Final    BOTTLES DRAWN AEROBIC AND ANAEROBIC Blood Culture adequate volume Performed at [place] [address] [place], [place] [address]    Culture  Setup Time   Final    GRAM NEGATIVE RODS IN BOTH AEROBIC AND ANAEROBIC BOTTLES CRITICAL RESULT CALLED TO, READ BACK BY AND VERIFIED WITH: PHARMD M.BELL AT 1019 ON 06/06/2022 BY T.SAAD. Performed at [facility], [place] [address], [place], [place] [address]    Culture KLEBSIELLA PNEUMONIAE (A)  Final   Report Status 06/08/2022 FINAL  Final   Organism ID, Bacteria KLEBSIELLA PNEUMONIAE  Final      Susceptibility   Klebsiella pneumoniae - MIC*    AMPICILLIN RESISTANT Resistant     CEFEPIME <=0.12 SENSITIVE Sensitive     CEFTAZIDIME <=1 SENSITIVE Sensitive     CEFTRIAXONE <=0.25 SENSITIVE Sensitive     CIPROFLOXACIN <=0.25 SENSITIVE Sensitive     GENTAMICIN <=1 SENSITIVE Sensitive     IMIPENEM <=0.25 SENSITIVE Sensitive     TRIMETH/SULFA <=20 SENSITIVE Sensitive     AMPICILLIN/SULBACTAM 4 SENSITIVE Sensitive     PIP/TAZO <=4 SENSITIVE Sensitive     *  KLEBSIELLA PNEUMONIAE  Blood Culture ID Panel (Reflexed)     Status: Abnormal   Collection Time: 06/05/22  6:46 PM  Result Value Ref Range Status   Enterococcus faecalis NOT DETECTED NOT DETECTED Final   Enterococcus Faecium NOT DETECTED NOT DETECTED Final   Listeria monocytogenes NOT DETECTED NOT DETECTED Final   Staphylococcus species NOT DETECTED NOT DETECTED Final   Staphylococcus aureus (BCID) NOT DETECTED NOT DETECTED Final   Staphylococcus epidermidis NOT DETECTED NOT DETECTED Final   Staphylococcus lugdunensis NOT DETECTED NOT DETECTED Final   Streptococcus species NOT DETECTED NOT DETECTED Final   Streptococcus agalactiae NOT DETECTED NOT DETECTED Final   Streptococcus pneumoniae NOT DETECTED NOT DETECTED Final   Streptococcus pyogenes NOT DETECTED NOT DETECTED Final   A.calcoaceticus-baumannii NOT DETECTED NOT DETECTED Final   Bacteroides fragilis NOT DETECTED NOT DETECTED Final   Enterobacterales DETECTED (A) NOT DETECTED Final    Comment: Enterobacterales represent a large order of gram negative bacteria, not a single organism. CRITICAL RESULT CALLED TO, READ BACK BY AND VERIFIED WITH: PHARMD M.BELL AT 1019 ON 06/06/2022 BY T.SAAD.    Enterobacter cloacae complex NOT DETECTED NOT DETECTED Final   Escherichia coli NOT DETECTED NOT DETECTED Final   Klebsiella aerogenes NOT DETECTED NOT DETECTED Final   Klebsiella oxytoca NOT DETECTED NOT DETECTED Final   Klebsiella pneumoniae DETECTED (A) NOT DETECTED Final    Comment: CRITICAL RESULT CALLED TO, READ BACK BY AND VERIFIED WITH: PHARMD M.BELL AT 1019 ON 06/06/2022 BY T.SAAD.    Proteus species NOT DETECTED NOT DETECTED Final   Salmonella species NOT DETECTED NOT DETECTED Final   Serratia marcescens NOT DETECTED NOT DETECTED Final   Haemophilus influenzae NOT DETECTED NOT DETECTED Final   Neisseria meningitidis NOT DETECTED NOT DETECTED Final   Pseudomonas aeruginosa NOT DETECTED NOT DETECTED Final   Stenotrophomonas  maltophilia NOT DETECTED NOT DETECTED Final   Candida albicans NOT DETECTED NOT DETECTED Final   Candida auris NOT DETECTED NOT DETECTED Final   Candida glabrata NOT DETECTED NOT DETECTED Final   Candida krusei NOT DETECTED NOT DETECTED Final   Candida parapsilosis NOT DETECTED NOT DETECTED Final   Candida tropicalis NOT DETECTED NOT DETECTED Final   Cryptococcus neoformans/gattii NOT DETECTED NOT DETECTED Final   CTX-M ESBL NOT DETECTED NOT DETECTED Final   Carbapenem resistance IMP NOT DETECTED NOT DETECTED Final   Carbapenem resistance KPC NOT DETECTED NOT DETECTED Final   Carbapenem resistance NDM NOT DETECTED NOT DETECTED Final   Carbapenem resist OXA 48 LIKE NOT DETECTED NOT DETECTED Final   Carbapenem resistance VIM NOT DETECTED NOT DETECTED Final    Comment: Performed at [facility], [address], [place], [place] [address]  Blood culture (routine x 2)     Status: None   Collection Time: 06/05/22  7:25 PM   Specimen: BLOOD RIGHT FOREARM  Result Value Ref Range Status   Specimen Description   Final  BLOOD RIGHT FOREARM Performed at [facility], [place] [address], [place], [place] [address]    Special Requests   Final    BOTTLES DRAWN AEROBIC AND ANAEROBIC Blood Culture results may not be optimal due to an excessive volume of blood received in culture bottles Performed at [place] [address], [place], [place] [address]    Culture   Final    NO GROWTH 5 DAYS Performed at [facility], [place] [address], [place], [place] [address]    Report Status 06/10/2022 FINAL  Final  Urine Culture (for pregnant, neutropenic or urologic patients or patients with an indwelling urinary catheter)     Status: None   Collection Time: 06/05/22 10:58 PM   Specimen: Urine, Clean Catch  Result Value Ref Range Status   Specimen Description   Final    URINE, CLEAN CATCH Performed at [facility], [place] [address] [place], [place]  [address]    Special Requests   Final    Immunocompromised Performed at [facility], [place] [address] [place], [place] [address]    Culture   Final    NO GROWTH Performed at [facility], [place] [address] [place], [place] [address]    Report Status 06/07/2022 FINAL  Final  Gastrointestinal Panel by PCR , Stool     Status: None   Collection Time: 06/06/22 12:35 AM   Specimen: Stool  Result Value Ref Range Status   Campylobacter species NOT DETECTED NOT DETECTED Corrected   Plesimonas shigelloides NOT DETECTED NOT DETECTED Corrected   Salmonella species NOT DETECTED NOT DETECTED Corrected   Yersinia enterocolitica NOT DETECTED NOT DETECTED Corrected   Vibrio species NOT DETECTED NOT DETECTED Corrected   Vibrio cholerae NOT DETECTED NOT DETECTED Corrected   Enteroaggregative E coli (EAEC) NOT DETECTED NOT DETECTED Corrected   Enteropathogenic E coli (EPEC) NOT DETECTED NOT DETECTED Corrected   Enterotoxigenic E coli (ETEC) NOT DETECTED NOT DETECTED Corrected   Shiga like toxin producing E coli (STEC) NOT DETECTED NOT DETECTED Corrected   Shigella/Enteroinvasive E coli (EIEC) NOT DETECTED NOT DETECTED Corrected   Cryptosporidium NOT DETECTED NOT DETECTED Corrected   Cyclospora cayetanensis NOT DETECTED NOT DETECTED Corrected   Entamoeba histolytica NOT DETECTED NOT DETECTED Corrected   Giardia lamblia NOT DETECTED NOT DETECTED Corrected   Adenovirus F40/41 NOT DETECTED NOT DETECTED Corrected   Astrovirus NOT DETECTED NOT DETECTED Corrected   Norovirus GI/GII NOT DETECTED NOT DETECTED Corrected   Rotavirus A NOT DETECTED NOT DETECTED Corrected   Sapovirus (I, II, IV, and V) NOT DETECTED NOT DETECTED Corrected    Comment: Performed at [facility], [place] [place], [place] [address]  C Difficile Quick Screen w PCR reflex     Status: Abnormal   Collection Time: 06/06/22 12:35 AM   Specimen: STOOL  Result Value Ref Range Status   C Diff antigen  POSITIVE (A) NEGATIVE Final   C Diff toxin NEGATIVE NEGATIVE Final   C Diff interpretation Results are indeterminate. See PCR results.  Final    Comment: Performed at [facility], [place] [address] [place], [place] [address]  MRSA Next Gen by PCR, Nasal     Status: None   Collection Time: 06/06/22 12:35 AM  Result Value Ref Range Status   MRSA by PCR Next Gen NOT DETECTED NOT DETECTED Final    Comment: (NOTE) The GeneXpert MRSA Assay (FDA approved for NASAL specimens only), is  one component of a comprehensive MRSA colonization surveillance program. It is not intended to diagnose MRSA infection nor to guide or monitor treatment for MRSA infections. Test performance is not FDA approved in patients less than 50 years old. Performed at [facility], [place] [address] [place], [place] [address]   C. Diff by PCR, Reflexed     Status: None   Collection Time: 06/06/22 12:35 AM  Result Value Ref Range Status   Toxigenic C. Difficile by PCR NEGATIVE NEGATIVE Final    Comment: Patient is colonized with non toxigenic C. difficile. May not need treatment unless significant symptoms are present. Performed at [facility], [place] [address], [place], [place] [address]   Urine Culture (for pregnant, neutropenic or urologic patients or patients with an indwelling urinary catheter)     Status: None   Collection Time: 06/06/22  4:44 PM   Specimen: Urine, Clean Catch  Result Value Ref Range Status   Specimen Description   Final    URINE, CLEAN CATCH Performed at [facility], [place] [address], [place], [place] [address]    Special Requests   Final    NONE Performed at [facility], [place] [address] [place], [place] [address]    Culture   Final    NO GROWTH Performed at [facility], [place] [address], [place], [place] [address]    Report Status 06/07/2022 FINAL  Final     Radiology Studies: DG Abd 1 View  Result  Date: 06/10/2022 CLINICAL DATA:  Nasogastric tube placement EXAM: ABDOMEN - 1 VIEW COMPARISON:  06/07/2022 FINDINGS: Nasoenteric feeding tube tip is seen overlying the expected distal body of the stomach. Normal abdominal gas pattern. No organomegaly. Pelvis excluded from view. Lumbar dextroscoliosis noted with superimposed degenerative change. IMPRESSION: 1. Nasoenteric feeding tube tip within the distal body of the stomach. Electronically Signed   By: Fidela Salisbury M.D.   On: 06/10/2022 22:37    Scheduled Meds:  amLODipine  5 mg Oral Daily   Chlorhexidine Gluconate Cloth  6 each Topical Daily   cholestyramine light  4 g Per Tube BID   feeding supplement  237 mL Oral TID BM   feeding supplement (PROSource TF20)  60 mL Per Tube Daily   loperamide HCl  2 mg Per Tube BID   mirtazapine  7.5 mg Oral QHS   sucralfate  1 g Per Tube TID WC & HS   thiamine  100 mg Per Tube Daily   Continuous Infusions:  sodium chloride 10 mL/hr at 06/07/22 0645   cefTRIAXone (ROCEPHIN)  IV Stopped (06/10/22 1231)   feeding supplement (OSMOLITE 1.5 CAL) 1,000 mL (06/11/22 0414)     LOS: 6 days   Shelly Coss, MD [facility] P2/26/2024, 11:16 AM

## 2022-06-11 NOTE — Progress Notes (Signed)
Palliative-   Consult received, chart reviewed.   Called and spoke with patient's spouse. She was hesitant to proceed with Palliative consult noting patient was doing well and didn't want to set him back. I discussed role of Palliative medicine with her. Palliative medicine is specialized medical care for people living with serious illness. It focuses on providing relief from the symptoms and stress of a serious illness. The goal is to improve quality of life for both the patient and the family. Palliative medicine support is provided at any time during a chronic illness, including when there is curative and life-prolonging intent to treatments.   Plan made to meet with patient and his spouse tomorrow at [place], AGNP-C Palliative Medicine  No charge

## 2022-06-11 NOTE — TOC Initial Note (Addendum)
Transition of Care [facility]) - Initial/Assessment Note    Patient Details  Name: Jesse [name] MRN: [id number] Date of Birth: Jul 20, 1950  Transition of Care [facility]) CM/SW Contact:    Jesse Kaufman, RN Phone Number: 06/11/2022, 5:33 PM  Clinical Narrative:    This RNCM received TOC consult for  SNF placement. Per chart review patient currently in ICU, had some confusion. This RNCM spoke with patient's wife Jesse [name] to advise of PT recommendation for short SNF placement. [name] indicates the ultimate goal is for patient to return home however, agreeable to short SNF prior to returning home. This RNCM explained short term /snf placement process. Patient and [name] do not have a SNF preference. [name] reports prior to admission patient does not have any DME, previous HHRN visit when ostomy was placed. [name] reports patient was "very independent" prior to admission.   ID and dietician are following patient.  TOC will continue for follow for discharge needs.              Expected Discharge Plan: Skilled Nursing Facility Barriers to Discharge: Continued Medical Work up   Patient Goals and CMS Choice Patient states their goals for this hospitalization and ongoing recovery are:: short term rehab and then return home CMS Medicare.gov Compare Post Acute Care list provided to:: Patient Represenative (must comment) Jesse [name] (wife)) Choice offered to / list presented to : Jesse [name] ownership interest in [facility]provided to:: Spouse    Expected Discharge Plan and Services In-house Referral: NA, Nutrition Discharge Planning Services: CM Consult Post Acute Care Choice: [place] Living arrangements for the past 2 months: Single Family Home                 DME Arranged: N/A DME Agency: NA       HH Arranged: NA HH Agency: NA        Prior Living Arrangements/Services Living arrangements for the past 2 months: Single Family Home Lives with:: Spouse Patient  language and need for interpreter reviewed:: Yes Do you feel safe going back to the place where you live?: Yes      Need for Family Participation in Patient Care: Yes (Comment) Care giver support system in place?: No (comment) Current home services: Other (comment) (none) Criminal Activity/Legal Involvement Pertinent to Current Situation/Hospitalization: No - Comment as needed  Activities of Daily Living Home Assistive Devices/Equipment: None ADL Screening (condition at time of admission) Patient's cognitive ability adequate to safely complete daily activities?: Yes Is the patient deaf or have difficulty hearing?: No Does the patient have difficulty seeing, even when wearing glasses/contacts?: No Does the patient have difficulty concentrating, remembering, or making decisions?: No Patient able to express need for assistance with ADLs?: Yes Does the patient have difficulty dressing or bathing?: No Independently performs ADLs?: Yes (appropriate for developmental age) Does the patient have difficulty walking or climbing stairs?: No Weakness of Legs: Both Weakness of Arms/Hands: Both  Permission Sought/Granted Permission sought to share information with : Case Manager Permission granted to share information with : Yes, Verbal Permission Granted  Share Information with NAME: Case Manager           Emotional Assessment Appearance:: Appears stated age Attitude/Demeanor/Rapport: Gracious Affect (typically observed): Accepting   Alcohol / Substance Use: Not Applicable Psych Involvement: No (comment)  Admission diagnosis:  Hypokalemia [E87.6] Sepsis ([place]) [A41.9] Severe sepsis ([place]) [A41.9, R65.20] Pneumonia of right lung due to infectious organism, unspecified part of lung [J18.9] Sepsis, due to  unspecified organism, unspecified whether acute organ dysfunction present [facility]) [A41.9] Patient Active Problem List   Diagnosis Date Noted   Drug-induced neutropenia ([place]) 06/07/2022    Protein-calorie malnutrition, severe 06/07/2022   Bacteremia due to Klebsiella pneumoniae 06/06/2022   Pneumonia of right middle lobe due to Klebsiella pneumoniae ([place]) 06/06/2022   Severe sepsis ([place]) 06/05/2022   Hypokalemia 06/05/2022   AKI (acute kidney injury) ([place]) 06/05/2022   Hyponatremia 0000000   Acute metabolic encephalopathy 0000000   Acute respiratory failure with hypoxia ([place]) 06/05/2022   CAP (community acquired pneumonia) 06/05/2022   Thrombocytopenia ([place]) 06/05/2022   Sepsis ([place]) 06/05/2022   Diarrhea 06/05/2022   Weight loss 05/28/2022   Malignant neoplasm of lung ([place]) 04/30/2022   Adenopathy 04/06/2022   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[place]) 08/21/2021   Encounter for antineoplastic chemotherapy 01/28/2020   Adenocarcinoma of left lung, stage 2 ([place]) 12/31/2019   Goals of care, counseling/discussion 12/31/2019   S/P lobectomy of lung 12/16/2019   Lung nodule 10/13/2019   Essential hypertension 09/28/2016   Coronary artery calcification 09/28/2016   Impaired glucose tolerance 09/28/2016   NEOPLASM, MALIGNANT, BLADDER, HX OF 07/18/2007   Vitamin D deficiency 06/03/2007   DIVERTICULOSIS, COLON 01/14/2007   Osteoarthritis 01/14/2007   Dyslipidemia 01/09/2007   GERD 01/09/2007   REFLUX ESOPHAGITIS 06/01/2004   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   PCP:  Jesse Ruddy, MD Pharmacy:   [id number] [place], [place] - [address] [place] [id number]: [phone]: [phone]    Social Determinants of Health (SDOH) Social History: SDOH Screenings   Food Insecurity: No Food Insecurity (06/06/2022)  Housing: Low Risk  (06/06/2022)  Transportation Needs: No Transportation Needs (06/06/2022)  Utilities: Not At Risk (06/06/2022)  Depression (PHQ2-9): Low Risk  (05/07/2022)  Financial Resource Strain: Low Risk  (07/18/2021)  Physical Activity:  Sufficiently Active (07/18/2021)  Social Connections: Moderately Isolated (07/12/2020)  Stress: No Stress Concern Present (07/18/2021)  Tobacco Use: Medium Risk (06/05/2022)   SDOH Interventions:     Readmission Risk Interventions     No data to display

## 2022-06-12 ENCOUNTER — Inpatient Hospital Stay (HOSPITAL_COMMUNITY): Payer: Medicare Other

## 2022-06-12 ENCOUNTER — Ambulatory Visit
Admission: RE | Admit: 2022-06-12 | Discharge: 2022-06-12 | Disposition: A | Discharge status: Home / Self Care | Payer: Medicare Other | Source: Ambulatory Visit | Attending: Radiation Oncology | Admitting: Radiation Oncology

## 2022-06-12 ENCOUNTER — Other Ambulatory Visit: Payer: Self-pay

## 2022-06-12 DIAGNOSIS — I6389 Other cerebral infarction: Secondary | ICD-10-CM | POA: Diagnosis not present

## 2022-06-12 DIAGNOSIS — C771 Secondary and unspecified malignant neoplasm of intrathoracic lymph nodes: Secondary | ICD-10-CM | POA: Diagnosis not present

## 2022-06-12 DIAGNOSIS — B370 Candidal stomatitis: Secondary | ICD-10-CM | POA: Diagnosis not present

## 2022-06-12 DIAGNOSIS — I639 Cerebral infarction, unspecified: Secondary | ICD-10-CM

## 2022-06-12 DIAGNOSIS — J189 Pneumonia, unspecified organism: Secondary | ICD-10-CM | POA: Diagnosis not present

## 2022-06-12 DIAGNOSIS — C3412 Malignant neoplasm of upper lobe, left bronchus or lung: Secondary | ICD-10-CM | POA: Diagnosis not present

## 2022-06-12 DIAGNOSIS — C3492 Malignant neoplasm of unspecified part of left bronchus or lung: Secondary | ICD-10-CM | POA: Diagnosis not present

## 2022-06-12 DIAGNOSIS — A419 Sepsis, unspecified organism: Secondary | ICD-10-CM | POA: Diagnosis not present

## 2022-06-12 DIAGNOSIS — Z510 Encounter for antineoplastic radiation therapy: Secondary | ICD-10-CM | POA: Diagnosis not present

## 2022-06-12 DIAGNOSIS — D701 Agranulocytosis secondary to cancer chemotherapy: Secondary | ICD-10-CM | POA: Diagnosis not present

## 2022-06-12 DIAGNOSIS — R6520 Severe sepsis without septic shock: Secondary | ICD-10-CM | POA: Diagnosis not present

## 2022-06-12 DIAGNOSIS — Z7189 Other specified counseling: Secondary | ICD-10-CM

## 2022-06-12 DIAGNOSIS — F17210 Nicotine dependence, cigarettes, uncomplicated: Secondary | ICD-10-CM | POA: Diagnosis not present

## 2022-06-12 LAB — GLUCOSE, CAPILLARY
Glucose-Capillary: 114 mg/dL — ABNORMAL HIGH (ref 70–99)
Glucose-Capillary: 125 mg/dL — ABNORMAL HIGH (ref 70–99)
Glucose-Capillary: 128 mg/dL — ABNORMAL HIGH (ref 70–99)
Glucose-Capillary: 134 mg/dL — ABNORMAL HIGH (ref 70–99)
Glucose-Capillary: 137 mg/dL — ABNORMAL HIGH (ref 70–99)
Glucose-Capillary: 151 mg/dL — ABNORMAL HIGH (ref 70–99)

## 2022-06-12 LAB — CBC
HCT: 27.6 % — ABNORMAL LOW (ref 39.0–52.0)
Hemoglobin: 9 g/dL — ABNORMAL LOW (ref 13.0–17.0)
MCH: 30.7 pg (ref 26.0–34.0)
MCHC: 32.6 g/dL (ref 30.0–36.0)
MCV: 94.2 fL (ref 80.0–100.0)
Platelets: 104 10*3/uL — ABNORMAL LOW (ref 150–400)
RBC: 2.93 MIL/uL — ABNORMAL LOW (ref 4.22–5.81)
RDW: 15.5 % (ref 11.5–15.5)
WBC: 5.1 10*3/uL (ref 4.0–10.5)
nRBC: 0 % (ref 0.0–0.2)

## 2022-06-12 LAB — ECHOCARDIOGRAM COMPLETE
Area-P 1/2: 6.32 cm2
Calc EF: 40.7 %
Height: 69 in
S' Lateral: 4.5 cm
Single Plane A2C EF: 40.3 %
Single Plane A4C EF: 41.9 %
Weight: 2335.11 oz

## 2022-06-12 LAB — RAD ONC ARIA SESSION SUMMARY
Course Elapsed Days: 28
Plan Fractions Treated to Date: 19
Plan Prescribed Dose Per Fraction: 2 Gy
Plan Total Fractions Prescribed: 30
Plan Total Prescribed Dose: 60 Gy
Reference Point Dosage Given to Date: 38 Gy
Reference Point Session Dosage Given: 2 Gy
Session Number: 19

## 2022-06-12 LAB — COMPREHENSIVE METABOLIC PANEL
ALT: 21 U/L (ref 0–44)
AST: 24 U/L (ref 15–41)
Albumin: 2.1 g/dL — ABNORMAL LOW (ref 3.5–5.0)
Alkaline Phosphatase: 104 U/L (ref 38–126)
Anion gap: 7 (ref 5–15)
BUN: 29 mg/dL — ABNORMAL HIGH (ref 8–23)
CO2: 25 mmol/L (ref 22–32)
Calcium: 7.9 mg/dL — ABNORMAL LOW (ref 8.9–10.3)
Chloride: 106 mmol/L (ref 98–111)
Creatinine, Ser: 0.53 mg/dL — ABNORMAL LOW (ref 0.61–1.24)
GFR, Estimated: >60 mL/min (ref >60)
Glucose, Bld: 122 mg/dL — ABNORMAL HIGH (ref 70–99)
Potassium: 4.3 mmol/L (ref 3.5–5.1)
Sodium: 138 mmol/L (ref 135–145)
Total Bilirubin: 0.5 mg/dL (ref 0.3–1.2)
Total Protein: 5.5 g/dL — ABNORMAL LOW (ref 6.5–8.1)

## 2022-06-12 LAB — LIPID PANEL
Cholesterol: 83 mg/dL (ref 0–200)
HDL: 13 mg/dL — ABNORMAL LOW (ref >40)
LDL Cholesterol: 38 mg/dL (ref 0–99)
Total CHOL/HDL Ratio: 6.4 RATIO
Triglycerides: 160 mg/dL — ABNORMAL HIGH (ref <150)
VLDL: 32 mg/dL (ref 0–40)

## 2022-06-12 LAB — PHOSPHORUS: Phosphorus: 3.4 mg/dL (ref 2.5–4.6)

## 2022-06-12 LAB — MAGNESIUM: Magnesium: 1.6 mg/dL — ABNORMAL LOW (ref 1.7–2.4)

## 2022-06-12 MED ORDER — ACETAMINOPHEN 160 MG/5ML PO SOLN
1000.0000 mg | Freq: Three times a day (TID) | ORAL | Status: DC
  Administered 2022-06-12 – 2022-06-26 (×41): 1000 mg via ORAL
  Filled 2022-06-12 (×41): qty 40.6

## 2022-06-12 MED ORDER — MAGNESIUM SULFATE 2 GM/50ML IV SOLN
2.0000 g | Freq: Once | INTRAVENOUS | Status: AC
  Administered 2022-06-12: 2 g via INTRAVENOUS
  Filled 2022-06-12: qty 50

## 2022-06-12 MED ORDER — LIDOCAINE VISCOUS HCL 2 % MT SOLN
15.0000 mL | OROMUCOSAL | Status: DC | PRN
  Administered 2022-06-12 – 2022-07-03 (×6): 15 mL via OROMUCOSAL
  Filled 2022-06-12 (×10): qty 15

## 2022-06-12 MED ORDER — SUCRALFATE 1 GM/10ML PO SUSP
1.0000 g | Freq: Three times a day (TID) | ORAL | Status: DC
  Administered 2022-06-12 – 2022-07-03 (×77): 1 g via ORAL
  Filled 2022-06-12 (×73): qty 10

## 2022-06-12 MED ORDER — ATORVASTATIN CALCIUM 20 MG PO TABS
20.0000 mg | ORAL_TABLET | Freq: Every day | ORAL | Status: DC
  Administered 2022-06-12 – 2022-07-06 (×24): 20 mg via ORAL
  Filled 2022-06-12 (×25): qty 1

## 2022-06-12 MED ORDER — LOSARTAN POTASSIUM 50 MG PO TABS
50.0000 mg | ORAL_TABLET | Freq: Every day | ORAL | Status: DC
  Administered 2022-06-12 – 2022-06-29 (×17): 50 mg via ORAL
  Filled 2022-06-12 (×18): qty 1

## 2022-06-12 MED ORDER — PERFLUTREN LIPID MICROSPHERE
1.0000 mL | INTRAVENOUS | Status: AC | PRN
  Administered 2022-06-12: 2 mL via INTRAVENOUS

## 2022-06-12 MED ORDER — NYSTATIN 100000 UNIT/ML MT SUSP
5.0000 mL | Freq: Four times a day (QID) | OROMUCOSAL | Status: DC
  Administered 2022-06-13 – 2022-06-29 (×59): 500000 [IU] via ORAL
  Filled 2022-06-12 (×60): qty 5

## 2022-06-12 MED ORDER — FLUCONAZOLE IN SODIUM CHLORIDE 200-0.9 MG/100ML-% IV SOLN
200.0000 mg | INTRAVENOUS | Status: AC
  Administered 2022-06-12: 200 mg via INTRAVENOUS
  Filled 2022-06-12: qty 100

## 2022-06-12 MED ORDER — METOPROLOL TARTRATE 25 MG PO TABS
25.0000 mg | ORAL_TABLET | Freq: Two times a day (BID) | ORAL | Status: DC
  Administered 2022-06-12 – 2022-07-06 (×44): 25 mg via ORAL
  Filled 2022-06-12 (×48): qty 1

## 2022-06-12 NOTE — Consult Note (Signed)
Consultation Note Date: 06/12/2022   Patient Name: Jesse [name]  DOB: [birth year]  MRN: [id number]  Age / Sex: 72 y.o., male  PCP: [name], MD Referring Physician: Shelly Coss, MD  Reason for Consultation:  "lung cancer patient presented with with bacteremia.has very poor intake ,on tube feed,very deconditioned,weak.Quality of life is very poor.Needs goals of care"  HPI/Patient Profile: 72 y.o. male  with past medical history of GIST tumor of pelvic area (dx 12/2019) - s/p resection and loop ileostomy for fecal diversion 12/14/21 and takedown on 03/22/22 on Gleevec; [place] lung cancer dx June 2021 with positive mediastinal lymph node December 2023 currently on concurrent chemoradiation tx with carbo/paclitaxel admitted on 06/05/2022 with sepsis d/t bacteremia- psuedomonas klebsiella after being very weak and unresponsive at home. Further workup revealed multiple scattered embolic strokes- workup in progress to determine cause. He has had poor po intake and significant weight loss- cortrak tube feeding is in place.    Primary Decision Maker PATIENT and spouse- Jesse [name]  Discussion: Chart reviewed including labs, progress notes, imaging from this and previous encounters.  Met with him and his spouse at bedside. His mental status is improving- but he continues to be mildly disoriented- could not remember exactly why he is in the hospital, unable to recall month, but he is able to participate in goals of care discussion.  Originally [place]. Served as a Runner, broadcasting/film/video in [employer]. Enjoys working on his [place] acres of land with his 2 rescue dogs. Married for 36 years to woman of his dreams. Plays golf- last golf game was 2 months ago. Enjoys traveling has been to more than 25 different countries.  Prior to admission lived at home, independent with ADLs and iADLs, drove himself to treatments. We reviewed his  current acute illness. Questions answered regarding labs and treatments.  His overall GOC are to continue treatments and try to regain his functional status to a point where he can again travel.  He has had decreased po intake due to pain in his throat since starting radiation treatments. Pain worsens with eating or drinking is in his mouth and extends into his esophagus. He wants to eat more and wants to make attempts to drink the recommended protein supplements.    SUMMARY OF RECOMMENDATIONS -[place]- pharmacy consult for diflucan dosing -Radiation esophagitis-  -has been getting carafate "per tube"- change to po so can benefit from mouth and  esophagus coating;  -acetaminophen '1000mg'$  po TID;  -viscous lidocaine 28m q3hr prn- use before meals and supplements -Continue full scope care -PMT will continue to follow and [facility]  for continued symptom management and [place]ions  Code Status/Advance Care Planning: Full code   Prognosis:   Unable to determine  Discharge Planning: To Be Determined  Primary Diagnoses: Present on Admission:  Severe sepsis ([place]  Dyslipidemia  Essential hypertension  Adenocarcinoma of left lung, stage 2 (HCC)  Hypokalemia  AKI (acute kidney injury) ([place]  Hyponatremia  Acute metabolic encephalopathy  Acute respiratory failure with hypoxia (HCC)  CAP (community acquired pneumonia)  Thrombocytopenia ([place])  Sepsis ([place])  Diarrhea  Malignant neoplasm of lung ([place])   Review of Systems  Constitutional:  Positive for appetite change and fatigue.    Physical Exam Vitals and nursing note reviewed.  Constitutional:      Comments: Frail  HENT:     Mouth/Throat:     Comments: Tongue and throat red with white patches Neurological:     Comments: Mildly confused     Vital Signs: BP (!) 150/88   Pulse (!) 101   Temp 97.9 F (36.6 C)   Resp (!) 21   Ht '5\' 9"'$  (1.753 m)   Wt 66.2 kg    SpO2 98%   BMI 21.55 kg/m  Pain Scale: 0-10 POSS *See Group Information*: S-Acceptable,Sleep, easy to arouse Pain Score: 0-No pain   SpO2: SpO2: 98 % O2 Device:SpO2: 98 % O2 Flow Rate: .O2 Flow Rate (L/min): 2 L/min  IO: Intake/output summary:  Intake/Output Summary (Last 24 hours) at 06/12/2022 1038 Last data filed at 06/12/2022 0942 Gross per 24 hour  Intake 828.33 ml  Output 1200 ml  Net -371.67 ml    LBM: Last BM Date : 06/10/22 Baseline Weight: Weight: 65.7 kg Most recent weight: Weight: 66.2 kg       Thank you for this consult. Palliative medicine will continue to follow and assist as needed.  Time Total: 90 minutes Greater than 50%  of this time was spent counseling and coordinating care related to the above assessment and plan.  Signed by: Mariana Kaufman, AGNP-C Palliative Medicine    Please contact Palliative Medicine Team phone at [phone] for questions and concerns.  For individual provider: See Shea Evans

## 2022-06-12 NOTE — Progress Notes (Signed)
Physical Therapy Treatment Patient Details Name: CECILIO [name] MRN: [id number] DOB: October 26, 1950 Today's Date: 06/12/2022   History of Present Illness 72yo male with significant hx of lung and bladder CA currently on chemo and radiation treatments, who presented with severe fatigue and confusion, cyanosis and soft BPs. Admitted with sepsis from unspecified organism and PNA R lung. PMH CA, COPD, HLD, lung mass, bladder CA, macular degeneration, hx ileostomy, B knee surgery    PT Comments    The patient  asking reason for being in hospital.  Patient  presents with generalized weakness. Patient requires mod assistance for mobility , standing and stepping  a few steps with RW. Patient reporting that he feels too weak to attempt ambulation.HR up to 129, SPO2 100% on RA.  Continue PT for mobility.  Recommendations for follow up therapy are one component of a multi-disciplinary discharge planning process, led by the attending physician.  Recommendations may be updated based on patient status, additional functional criteria and insurance authorization.  Follow Up Recommendations  Skilled nursing-short term rehab (<3 hours/day) Can patient physically be transported by private vehicle: No   Assistance Recommended at Discharge Frequent or constant Supervision/Assistance  Patient can return home with the following A lot of help with bathing/dressing/bathroom;Direct supervision/assist for medications management;A lot of help with walking and/or transfers;Direct supervision/assist for financial management;Assistance with cooking/housework;Assist for transportation;Help with stairs or ramp for entrance   Equipment Recommendations  Other (comment) (TBA)    Recommendations for Other Services       Precautions / Restrictions Precautions Precautions: Fall;Other (comment) Precaution Comments: watch O2/HR/BPs, NG TF Restrictions Weight Bearing Restrictions: No     Mobility  Bed Mobility   Bed  Mobility: Rolling, Sidelying to Sit, Sit to Supine Rolling: Min guard Sidelying to sit: Mod assist Supine to sit: Min assist     General bed mobility comments: mod assist with trunk to sitting, able to place legs back onto bed    Transfers Overall transfer level: Needs assistance Equipment used: Rolling walker (2 wheels) Transfers: Sit to/from Stand Sit to Stand: +2 safety/equipment, Mod assist   Step pivot transfers: Min assist, +2 safety/equipment       General transfer comment: Mod assist to power up with +2 assistance. Min assist with walker to side step x 2    Ambulation/Gait                   Stairs             Wheelchair Mobility    Modified Rankin (Stroke Patients Only)       Balance Overall balance assessment: Needs assistance Sitting-balance support: Bilateral upper extremity supported, Feet supported Sitting balance-[name]: Fair     Standing balance support: During functional activity, Reliant on assistive device for balance, Bilateral upper extremity supported Standing balance-[name]: Poor                              Cognition Arousal/Alertness: Awake/alert Behavior During Therapy: WFL for tasks assessed/performed, Flat affect Overall Cognitive Status: No family/caregiver present to determine baseline cognitive functioning Area of Impairment: Attention, Memory, Awareness, Safety/judgement, Following commands                   Current Attention Level: Sustained Memory: Decreased short-term memory Following Commands: Follows one step commands with increased time       General Comments: pt.orientred to place, not date, asking why he is  in hospital        Exercises      General Comments        Pertinent Vitals/Pain Pain Assessment Pain Assessment: No/denies pain    Home Living                          Prior Function            PT Goals (current goals can now be found in the  care plan section) Progress towards PT goals: Progressing toward goals    Frequency    Min 2X/week      PT Plan Current plan remains appropriate    Co-evaluation PT/OT/SLP Co-Evaluation/Treatment: Yes Reason for Co-Treatment: For patient/therapist safety;To address functional/ADL transfers PT goals addressed during session: Mobility/safety with mobility OT goals addressed during session: ADL's and self-care      AM-PAC PT "6 Clicks" Mobility   Outcome Measure  Help needed turning from your back to your side while in a flat bed without using bedrails?: A Little Help needed moving from lying on your back to sitting on the side of a flat bed without using bedrails?: A Lot Help needed moving to and from a bed to a chair (including a wheelchair)?: A Lot Help needed standing up from a chair using your arms (e.g., wheelchair or bedside chair)?: A Lot Help needed to walk in hospital room?: Total Help needed climbing 3-5 steps with a railing? : Total 6 Click Score: 11    End of Session Equipment Utilized During Treatment: Gait belt Activity Tolerance: Patient tolerated treatment well;Patient limited by fatigue Patient left: in bed;with call bell/phone within reach;with bed alarm set;with nursing/sitter in room Nurse Communication: Mobility status;Other (comment) PT Visit Diagnosis: Unsteadiness on feet (R26.81);Difficulty in walking, not elsewhere classified (R26.2);Muscle weakness (generalized) (M62.81)     Time: [id number] PT Time Calculation (min) (ACUTE ONLY): 19 min  Charges:    Visit only                     Tresa Endo [place] Office [phone] Weekend [phone]    Jesse [name] 06/12/2022, 9:44 AM

## 2022-06-12 NOTE — Progress Notes (Signed)
PROGRESS NOTE  Jesse [name]  [id number] DOB: Sep 11, 1950 DOA: 06/05/2022 PCP: [name], MD   Brief Narrative:  Patient is a 72 year old male with history of lung cancer currently under chemo/ radiation, hypertension, history of bladder cancer, COPD who presented with severe fatigue, confusion.  Found to be unresponsive on the day of admission, was having poor oral intake, also having diarrhea. On presentation, he was hypotensive, febrile with temperature of 102 F, tachycardic.  Lab work showed potassium of 2.6, creatinine of 1.8.  Chest x-ray showed patchy right infrahilar airspace disease.  Patient was admitted for management of severe sepsis.  Blood cultures showed Klebsiella pneumonia.  Hospital course remarkable for persistent poor oral intake needing feeding tube.  Remains very weak.  Palliative care also consulted for goals of care.  Goal is to take the feeding tube out pending adequate oral intake  Assessment & Plan:  Principal Problem:   Severe sepsis ([place]) Active Problems:   Dyslipidemia   Essential hypertension   Adenocarcinoma of left lung, stage 2 (HCC)   Malignant neoplasm of lung (HCC)   Hypokalemia   AKI (acute kidney injury) ([place])   Hyponatremia   Acute metabolic encephalopathy   Acute respiratory failure with hypoxia (HCC)   CAP (community acquired pneumonia)   Thrombocytopenia (HCC)   Sepsis ([place])   Diarrhea   Bacteremia due to Klebsiella pneumoniae   Pneumonia of right middle lobe due to Klebsiella pneumoniae ([place])   Drug-induced neutropenia (HCC)   Protein-calorie malnutrition, severe   Severe sepsis/gram-negative bacteremia secondary to pneumonia: Presented with poor oral intake, fever, hypotension, tachycardia, AKI, lactic acidosis.  Blood cultures showed Klebsiella pneumonia.  Currently on ceftriaxone. Sepsis physiology improved.  Blood pressure stable.  Procalcitonin trended down.  Urine culture did not show any growth.  ID was also  following.  Acute ischemic stroke: Remained persistently encephalopathic during this hospitalization.  CT head did not show any acute intracranial findings.  MRI of the brain showed Scattered small acute infarcts in the supratentorial and infratentorial brain.  Patient has generalized weakness but no focal deficits.  Neurology consulted.  Currently on aspirin, Plavix.LDL of [id number] pending  Systolic CHF: Echo done during workup of stroke showed EF of 40 to 45%, global hypokinesis.  Currently he is euvolemic.  Started on low-dose metoprolol.  Added losartan, discontinued amlodipine.  Cardiology consulted  Acute metabolic encephalopathy: Unresponsive /confused on  presentation.  No hypercapnia, ammonia level normal.  Oriented to place only.  Not agitated.  Obeys commands.  Likely contributed by stroke as well as delirium, generalized decline  Pancytopenia: Given filgrastim x 3.  Oncology was following.  Hemoglobin in the range of 9, platelets in the range of 100.  No evidence of acute blood loss.  Status post a unit of PRBC transfusion on 2/25.  Currently hemoglobin stable  Acute on chronic diarrhea: History of anterior resection syndrome.  C. difficile positive but toxin was negative.  GI pathogen panel negative.  On  Questran, loperamide.On contact precaution.  Diarrhea has stopped  History of hypertension: Initially hypotensive but now blood pressure has trended up.  Continue losartan, metoprolol  Dysphagia/anorexia/thrush: Likely associated with malignancy/chemo.  Speech therapy recommended dysphagia 3 diet.  Also started on slow tube feed.  On Remeron.  Goal is to stop tube feed after adequate oral intake.  Patient wants to remove it.  Dietitian following.  He is having very poor intake so tube will remain in place for now,also started on fluconazole for thrush  Stage II adenocarcinoma of left lung: Follows with Dr. Mckinley Jewel, on chemotherapy, radiation.  Radiation treatment being continued in this  hospitalization. We requested Dr Julien Nordmann  for follow-up  AKI: Resolved  Hyperlipidemia: Takes Lipitor at home  Deconditioning/weakness: PT/OT recommended SNF on discharge.  TOC following  Goals of care: Patient with history of lung cancer now with very poor oral intake, severe weakness.  Poor quality of life.  On tube feeding.  We requested palliative care consultation after discussion with wife,remains full code     Nutrition Problem: Severe Malnutrition Etiology: chronic illness (lung cancer currently undergoing chemotherapy and radiation therapy)    DVT prophylaxis:SCDs Start: 06/06/22 0153     Code Status: Full Code  Family Communication: Called and discussed with wife [name] on phone 2/27  Patient status:Inpatient  Patient is from :Home  Anticipated discharge to:SNF?   Estimated DC date:not sure   Consultants: ID,oncology, palliative care,cardiology  Procedures:None  Antimicrobials:  Anti-infectives (From admission, onward)    Start     Dose/Rate Route Frequency Ordered Stop   06/12/22 1230  fluconazole (DIFLUCAN) IVPB 200 mg        200 mg 100 mL/hr over 60 Minutes Intravenous Every 24 hours 06/12/22 1134     06/07/22 1200  vancomycin (VANCOCIN) IVPB 1000 mg/200 mL premix  Status:  Discontinued        1,000 mg 200 mL/hr over 60 Minutes Intravenous Every 36 hours 06/05/22 2323 06/06/22 1108   06/06/22 1200  cefTRIAXone (ROCEPHIN) 2 g in sodium chloride 0.9 % 100 mL IVPB        2 g 200 mL/hr over 30 Minutes Intravenous Every 24 hours 06/06/22 1110     06/06/22 0200  ceFEPIme (MAXIPIME) 2 g in sodium chloride 0.9 % 100 mL IVPB  Status:  Discontinued        2 g 200 mL/hr over 30 Minutes Intravenous Every 12 hours 06/05/22 2323 06/06/22 1108   06/05/22 2315  vancomycin (VANCOCIN) IVPB 1000 mg/200 mL premix        1,000 mg 200 mL/hr over 60 Minutes Intravenous  Once 06/05/22 2307 06/06/22 0145   06/05/22 1900  cefTRIAXone (ROCEPHIN) 2 g in sodium chloride 0.9 % 100  mL IVPB  Status:  Discontinued        2 g 200 mL/hr over 30 Minutes Intravenous Every 24 hours 06/05/22 1847 06/05/22 2323   06/05/22 1900  azithromycin (ZITHROMAX) 500 mg in sodium chloride 0.9 % 250 mL IVPB  Status:  Discontinued        500 mg 250 mL/hr over 60 Minutes Intravenous Every 24 hours 06/05/22 1847 06/06/22 1108   06/05/22 1830  ceFEPIme (MAXIPIME) 2 g in sodium chloride 0.9 % 100 mL IVPB  Status:  Discontinued        2 g 200 mL/hr over 30 Minutes Intravenous  Once 06/05/22 1820 06/05/22 1847   06/05/22 1830  metroNIDAZOLE (FLAGYL) IVPB 500 mg  Status:  Discontinued        500 mg 100 mL/hr over 60 Minutes Intravenous  Once 06/05/22 1820 06/05/22 1847   06/05/22 1830  vancomycin (VANCOCIN) IVPB 1000 mg/200 mL premix  Status:  Discontinued        1,000 mg 200 mL/hr over 60 Minutes Intravenous  Once 06/05/22 1820 06/05/22 1826   06/05/22 1830  vancomycin (VANCOREADY) IVPB 1500 mg/300 mL  Status:  Discontinued        1,500 mg 150 mL/hr over 120 Minutes Intravenous  Once 06/05/22 1826  06/05/22 1847       Subjective: Patient seen and examined at bedside today.  Lies in bed, deconditioned, weak.  Remains on tube feeding.  Denies any shortness of breath, cough or abdominal pain.  Obeys commands.  Oriented to place.  Has generalized weakness without focal deficits  Objective: Vitals:   06/12/22 0800 06/12/22 0900 06/12/22 0931 06/12/22 1000  BP: (!) 157/96 (!) 147/87 (!) 150/88 (!) 145/78  Pulse: (!) 106 (!) 101  (!) 108  Resp: (!) 21 (!) 21  (!) 22  Temp: 97.9 F (36.6 C)     TempSrc:      SpO2: 97% 98%  96%  Weight:      Height:        Intake/Output Summary (Last 24 hours) at 06/12/2022 1159 Last data filed at 06/12/2022 [id number] Gross per 24 hour  Intake 828.33 ml  Output 1200 ml  Net -371.67 ml   Filed Weights   06/10/22 0705 06/11/22 0500 06/12/22 0437  Weight: 65.2 kg 67.2 kg 66.2 kg    Examination:   General exam: Weak, deconditioned, chronically ill  looking HEENT: PERRL, feeding tube Respiratory system:  no wheezes or crackles  Cardiovascular system: S1 & S2 heard, RRR.  Gastrointestinal system: Abdomen is nondistended, soft and nontender. Central nervous system: Alert and oriented to place only, obese, once, generalized weakness Extremities: No edema, no clubbing ,no cyanosis Skin: No rashes, no ulcers,no icterus     Data Reviewed: I have personally reviewed following labs and imaging studies  CBC: Recent Labs  Lab 06/05/22 1846 06/06/22 0035 06/06/22 0603 06/08/22 0537 06/09/22 0345 06/10/22 0256 06/10/22 1812 06/11/22 0316 06/12/22 0330  WBC 1.1* 0.4*   < > 4.5 6.2 4.6  --  3.9* 5.1  NEUTROABS 0.9* 0.3*  --   --   --   --   --   --   --   HGB 9.2* 8.3*   < > 8.4* 7.7* 7.4* 9.2* 8.5* 9.0*  HCT 26.9* 24.5*   < > 24.4* 23.0* 22.6* 27.5* 26.2* 27.6*  MCV 90.9 92.1   < > 90.0 92.4 95.4  --  94.2 94.2  PLT 59* 47*   < > 56* 58* 60*  --  76* 104*   < > = values in this interval not displayed.   Basic Metabolic Panel: Recent Labs  Lab 06/07/22 1427 06/07/22 1643 06/08/22 0537 06/08/22 1532 06/08/22 2111 06/09/22 0325 06/10/22 0256 06/12/22 0330  NA  --   --  137 141  --  141 140 138  K  --   --  2.4* 3.1*  --  3.6 4.0 4.3  CL  --   --  108 110  --  111 113* 106  CO2  --   --  21* 23  --  '23 24 25  '$ GLUCOSE  --   --  112* 142*  --  148* 152* 122*  BUN  --   --  44* 44*  --  37* 36* 29*  CREATININE  --   --  0.96 1.02  --  0.78 0.78 0.53*  CALCIUM  --   --  7.6* 7.7*  --  8.0* 7.5* 7.9*  MG 1.7 1.8 1.9 1.5* 1.4* 2.9*  --  1.6*  PHOS 1.9* 1.9* 1.7* 1.5*  --   --   --  3.4     Recent Results (from the past 240 hour(s))  Resp panel by RT-PCR (RSV, Flu A&B, Covid) Anterior Nasal  Swab     Status: None   Collection Time: 06/05/22  6:20 PM   Specimen: Anterior Nasal Swab  Result Value Ref Range Status   SARS Coronavirus 2 by RT PCR NEGATIVE NEGATIVE Final    Comment: (NOTE) SARS-CoV-2 target nucleic acids are NOT  DETECTED.  The SARS-CoV-2 RNA is generally detectable in upper respiratory specimens during the acute phase of infection. The lowest concentration of SARS-CoV-2 viral copies this assay can detect is 138 copies/mL. A negative result does not preclude SARS-Cov-2 infection and should not be used as the sole basis for treatment or other patient management decisions. A negative result may occur with  improper specimen collection/handling, submission of specimen other than nasopharyngeal swab, presence of viral mutation(s) within the areas targeted by this assay, and inadequate number of viral copies(<138 copies/mL). A negative result must be combined with clinical observations, patient history, and epidemiological information. The expected result is Negative.  Fact Sheet for Patients:  EntrepreneurPulse.com.au  Fact Sheet for Healthcare Providers:  IncredibleEmployment.be  This test is no t yet approved or cleared by the [place] FDA and  has been authorized for detection and/or diagnosis of SARS-CoV-2 by FDA under an Emergency Use Authorization (EUA). This EUA will remain  in effect (meaning this test can be used) for the duration of the COVID-19 declaration under Section 564(b)(1) of the Act, 21 U.S.C.section 360bbb-3(b)(1), unless the authorization is terminated  or revoked sooner.       Influenza A by PCR NEGATIVE NEGATIVE Final   Influenza B by PCR NEGATIVE NEGATIVE Final    Comment: (NOTE) The Xpert Xpress SARS-CoV-2/FLU/RSV plus assay is intended as an aid in the diagnosis of influenza from Nasopharyngeal swab specimens and should not be used as a sole basis for treatment. Nasal washings and aspirates are unacceptable for Xpert Xpress SARS-CoV-2/FLU/RSV testing.  Fact Sheet for Patients: EntrepreneurPulse.com.au  Fact Sheet for Healthcare Providers: IncredibleEmployment.be  This test is not yet  approved or cleared by the [place] FDA and has been authorized for detection and/or diagnosis of SARS-CoV-2 by FDA under an Emergency Use Authorization (EUA). This EUA will remain in effect (meaning this test can be used) for the duration of the COVID-19 declaration under Section 564(b)(1) of the Act, 21 U.S.C. section 360bbb-3(b)(1), unless the authorization is terminated or revoked.     Resp Syncytial Virus by PCR NEGATIVE NEGATIVE Final    Comment: (NOTE) Fact Sheet for Patients: EntrepreneurPulse.com.au  Fact Sheet for Healthcare Providers: IncredibleEmployment.be  This test is not yet approved or cleared by the [place] FDA and has been authorized for detection and/or diagnosis of SARS-CoV-2 by FDA under an Emergency Use Authorization (EUA). This EUA will remain in effect (meaning this test can be used) for the duration of the COVID-19 declaration under Section 564(b)(1) of the Act, 21 U.S.C. section 360bbb-3(b)(1), unless the authorization is terminated or revoked.  Performed at [facility], [place] [address], [place], [place] [address]   Blood culture (routine x 2)     Status: Abnormal   Collection Time: 06/05/22  6:46 PM   Specimen: BLOOD  Result Value Ref Range Status   Specimen Description   Final    BLOOD BLOOD RIGHT ARM Performed at [place] [address], [place], [place] [address]    Special Requests   Final    BOTTLES DRAWN AEROBIC AND ANAEROBIC Blood Culture adequate volume Performed at [place] [address], [place], [place] [address]  Culture  Setup Time   Final    GRAM NEGATIVE RODS IN BOTH AEROBIC AND ANAEROBIC BOTTLES CRITICAL RESULT CALLED TO, READ BACK BY AND VERIFIED WITH: PHARMD M.BELL AT 1019 ON 06/06/2022 BY T.SAAD. Performed at [facility], [place] [address], [place], [place] [address]    Culture KLEBSIELLA PNEUMONIAE (A)   Final   Report Status 06/08/2022 FINAL  Final   Organism ID, Bacteria KLEBSIELLA PNEUMONIAE  Final      Susceptibility   Klebsiella pneumoniae - MIC*    AMPICILLIN RESISTANT Resistant     CEFEPIME <=0.12 SENSITIVE Sensitive     CEFTAZIDIME <=1 SENSITIVE Sensitive     CEFTRIAXONE <=0.25 SENSITIVE Sensitive     CIPROFLOXACIN <=0.25 SENSITIVE Sensitive     GENTAMICIN <=1 SENSITIVE Sensitive     IMIPENEM <=0.25 SENSITIVE Sensitive     TRIMETH/SULFA <=20 SENSITIVE Sensitive     AMPICILLIN/SULBACTAM 4 SENSITIVE Sensitive     PIP/TAZO <=4 SENSITIVE Sensitive     * KLEBSIELLA PNEUMONIAE  Blood Culture ID Panel (Reflexed)     Status: Abnormal   Collection Time: 06/05/22  6:46 PM  Result Value Ref Range Status   Enterococcus faecalis NOT DETECTED NOT DETECTED Final   Enterococcus Faecium NOT DETECTED NOT DETECTED Final   Listeria monocytogenes NOT DETECTED NOT DETECTED Final   Staphylococcus species NOT DETECTED NOT DETECTED Final   Staphylococcus aureus (BCID) NOT DETECTED NOT DETECTED Final   Staphylococcus epidermidis NOT DETECTED NOT DETECTED Final   Staphylococcus lugdunensis NOT DETECTED NOT DETECTED Final   Streptococcus species NOT DETECTED NOT DETECTED Final   Streptococcus agalactiae NOT DETECTED NOT DETECTED Final   Streptococcus pneumoniae NOT DETECTED NOT DETECTED Final   Streptococcus pyogenes NOT DETECTED NOT DETECTED Final   A.calcoaceticus-baumannii NOT DETECTED NOT DETECTED Final   Bacteroides fragilis NOT DETECTED NOT DETECTED Final   Enterobacterales DETECTED (A) NOT DETECTED Final    Comment: Enterobacterales represent a large order of gram negative bacteria, not a single organism. CRITICAL RESULT CALLED TO, READ BACK BY AND VERIFIED WITH: PHARMD M.BELL AT 1019 ON 06/06/2022 BY T.SAAD.    Enterobacter cloacae complex NOT DETECTED NOT DETECTED Final   Escherichia coli NOT DETECTED NOT DETECTED Final   Klebsiella aerogenes NOT DETECTED NOT DETECTED Final   Klebsiella  oxytoca NOT DETECTED NOT DETECTED Final   Klebsiella pneumoniae DETECTED (A) NOT DETECTED Final    Comment: CRITICAL RESULT CALLED TO, READ BACK BY AND VERIFIED WITH: PHARMD M.BELL AT 1019 ON 06/06/2022 BY T.SAAD.    Proteus species NOT DETECTED NOT DETECTED Final   Salmonella species NOT DETECTED NOT DETECTED Final   Serratia marcescens NOT DETECTED NOT DETECTED Final   Haemophilus influenzae NOT DETECTED NOT DETECTED Final   Neisseria meningitidis NOT DETECTED NOT DETECTED Final   Pseudomonas aeruginosa NOT DETECTED NOT DETECTED Final   Stenotrophomonas maltophilia NOT DETECTED NOT DETECTED Final   Candida albicans NOT DETECTED NOT DETECTED Final   Candida auris NOT DETECTED NOT DETECTED Final   Candida glabrata NOT DETECTED NOT DETECTED Final   Candida krusei NOT DETECTED NOT DETECTED Final   Candida parapsilosis NOT DETECTED NOT DETECTED Final   Candida tropicalis NOT DETECTED NOT DETECTED Final   Cryptococcus neoformans/gattii NOT DETECTED NOT DETECTED Final   CTX-M ESBL NOT DETECTED NOT DETECTED Final   Carbapenem resistance IMP NOT DETECTED NOT DETECTED Final   Carbapenem resistance KPC NOT DETECTED NOT DETECTED Final   Carbapenem resistance NDM NOT DETECTED NOT DETECTED Final   Carbapenem resist  OXA 48 LIKE NOT DETECTED NOT DETECTED Final   Carbapenem resistance VIM NOT DETECTED NOT DETECTED Final    Comment: Performed at [facility], [place] [address], [place], [place] [address]  Blood culture (routine x 2)     Status: None   Collection Time: 06/05/22  7:25 PM   Specimen: BLOOD RIGHT FOREARM  Result Value Ref Range Status   Specimen Description   Final    BLOOD RIGHT FOREARM Performed at [facility], [place] [address] [place], [place] [address]    Special Requests   Final    BOTTLES DRAWN AEROBIC AND ANAEROBIC Blood Culture results may not be optimal due to an excessive volume of blood received in culture bottles Performed at [facility] [address], [place], [place] [address]    Culture   Final    NO GROWTH 5 DAYS Performed at [facility], [place] [address], [place], [place] [address]    Report Status 06/10/2022 FINAL  Final  Urine Culture (for pregnant, neutropenic or urologic patients or patients with an indwelling urinary catheter)     Status: None   Collection Time: 06/05/22 10:58 PM   Specimen: Urine, Clean Catch  Result Value Ref Range Status   Specimen Description   Final    URINE, CLEAN CATCH Performed at [facility], [place] [address], [place], [place] [address]    Special Requests   Final    Immunocompromised Performed at [facility], [place] [address], [place], [place] [address]    Culture   Final    NO GROWTH Performed at [facility], [place] [address] [place], [place] [address]    Report Status 06/07/2022 FINAL  Final  Gastrointestinal Panel by PCR , Stool     Status: None   Collection Time: 06/06/22 12:35 AM   Specimen: Stool  Result Value Ref Range Status   Campylobacter species NOT DETECTED NOT DETECTED Corrected   Plesimonas shigelloides NOT DETECTED NOT DETECTED Corrected   Salmonella species NOT DETECTED NOT DETECTED Corrected   Yersinia enterocolitica NOT DETECTED NOT DETECTED Corrected   Vibrio species NOT DETECTED NOT DETECTED Corrected   Vibrio cholerae NOT DETECTED NOT DETECTED Corrected   Enteroaggregative E coli (EAEC) NOT DETECTED NOT DETECTED Corrected   Enteropathogenic E coli (EPEC) NOT DETECTED NOT DETECTED Corrected   Enterotoxigenic E coli (ETEC) NOT DETECTED NOT DETECTED Corrected   Shiga like toxin producing E coli (STEC) NOT DETECTED NOT DETECTED Corrected   Shigella/Enteroinvasive E coli (EIEC) NOT DETECTED NOT DETECTED Corrected   Cryptosporidium NOT DETECTED NOT DETECTED Corrected   Cyclospora cayetanensis NOT DETECTED NOT DETECTED Corrected   Entamoeba histolytica NOT DETECTED NOT DETECTED Corrected   Giardia lamblia NOT  DETECTED NOT DETECTED Corrected   Adenovirus F40/41 NOT DETECTED NOT DETECTED Corrected   Astrovirus NOT DETECTED NOT DETECTED Corrected   Norovirus GI/GII NOT DETECTED NOT DETECTED Corrected   Rotavirus A NOT DETECTED NOT DETECTED Corrected   Sapovirus (I, II, IV, and V) NOT DETECTED NOT DETECTED Corrected    Comment: Performed at [facility], [place] [place], [place] [address]  C Difficile Quick Screen w PCR reflex     Status: Abnormal   Collection Time: 06/06/22 12:35 AM   Specimen: STOOL  Result Value Ref Range Status   C Diff antigen POSITIVE (A) NEGATIVE Final   C Diff toxin NEGATIVE NEGATIVE Final   C Diff interpretation Results are  indeterminate. See PCR results.  Final    Comment: Performed at [facility], [place] [address] [place], [place] [address]  MRSA Next Gen by PCR, Nasal     Status: None   Collection Time: 06/06/22 12:35 AM  Result Value Ref Range Status   MRSA by PCR Next Gen NOT DETECTED NOT DETECTED Final    Comment: (NOTE) The GeneXpert MRSA Assay (FDA approved for NASAL specimens only), is one component of a comprehensive MRSA colonization surveillance program. It is not intended to diagnose MRSA infection nor to guide or monitor treatment for MRSA infections. Test performance is not FDA approved in patients less than 64 years old. Performed at [facility], [place] [address] [place], [place] [address]   C. Diff by PCR, Reflexed     Status: None   Collection Time: 06/06/22 12:35 AM  Result Value Ref Range Status   Toxigenic C. Difficile by PCR NEGATIVE NEGATIVE Final    Comment: Patient is colonized with non toxigenic C. difficile. May not need treatment unless significant symptoms are present. Performed at [facility], [place] [address] [place], [place] [address]   Urine Culture (for pregnant, neutropenic or urologic patients or patients with an indwelling urinary catheter)     Status: None    Collection Time: 06/06/22  4:44 PM   Specimen: Urine, Clean Catch  Result Value Ref Range Status   Specimen Description   Final    URINE, CLEAN CATCH Performed at [facility], [place] [address] [place], [place] [address]    Special Requests   Final    NONE Performed at [facility], [place] [address], [place], [place] [address]    Culture   Final    NO GROWTH Performed at [facility], [place] [address], [place],  [address]    Report Status 06/07/2022 FINAL  Final     Radiology Studies: ECHOCARDIOGRAM COMPLETE  Result Date: 06/12/2022    ECHOCARDIOGRAM REPORT   Patient Name:   MATHEUS SPORN Date of Exam: 06/12/2022 Medical Rec #:  [id number]       Height:       69.0 in Accession #:    [id number]      Weight:       145.9 lb Date of Birth:  06-24-1950       BSA:          1.807 m Patient Age:    14 years        BP:           141/96 mmHg Patient Gender: M               HR:           106 bpm. Exam Location:  Inpatient Procedure: 2D Echo, Cardiac Doppler, Color Doppler and Intracardiac            Opacification Agent Indications:    Stroke  History:        Patient has no prior history of Echocardiogram examinations.                 Signs/Symptoms:Bacteremia; Risk Factors:Hypertension and                 Dyslipidemia. Lung cancer. Chemo.  Sonographer:    Roseanna Rainbow RDCS Referring Phys: [id number] Brenden Rudman  Sonographer Comments: Technically difficult study due to poor echo windows, Technically challenging study due to limited acoustic windows, suboptimal parasternal  window, suboptimal apical window and suboptimal subcostal window. Extremely difficult study. IMPRESSIONS  1. Left ventricular ejection fraction, by estimation, is 40 to 45%. The left ventricle has mildly decreased function. The left ventricle demonstrates global hypokinesis. The left ventricular internal cavity size was mildly dilated. Indeterminate diastolic filling due to E-A fusion.  2. Right  ventricular systolic function is normal. The right ventricular size is normal. Tricuspid regurgitation signal is inadequate for assessing PA pressure.  3. The mitral valve is grossly normal. No evidence of mitral valve regurgitation.  4. The aortic valve is grossly normal. Aortic valve regurgitation is not visualized. No aortic stenosis is present.  5. The inferior vena cava is normal in size with greater than 50% respiratory variability, suggesting right atrial pressure of 3 mmHg. Conclusion(s)/Recommendation(s): No evidence of valvular vegetations on this transthoracic echocardiogram. Consider a transesophageal echocardiogram to exclude infective endocarditis if clinically indicated. FINDINGS  Left Ventricle: No left ventricular thrombus is seen (Definity contrast used). Left ventricular ejection fraction, by estimation, is 40 to 45%. The left ventricle has mildly decreased function. The left ventricle demonstrates global hypokinesis. Definity contrast agent was given IV to delineate the left ventricular endocardial borders. The left ventricular internal cavity size was mildly dilated. There is no left ventricular hypertrophy. Indeterminate diastolic filling due to E-A fusion. Right Ventricle: The right ventricular size is normal. No increase in right ventricular wall thickness. Right ventricular systolic function is normal. Tricuspid regurgitation signal is inadequate for assessing PA pressure. Left Atrium: Left atrial size was normal in size. Right Atrium: Right atrial size was normal in size. Pericardium: There is no evidence of pericardial effusion. Mitral Valve: The mitral valve is grossly normal. Mild mitral annular calcification. No evidence of mitral valve regurgitation. Tricuspid Valve: The tricuspid valve is normal in structure. Tricuspid valve regurgitation is not demonstrated. Aortic Valve: The aortic valve is grossly normal. Aortic valve regurgitation is not visualized. No aortic stenosis is present.  Pulmonic Valve: The pulmonic valve was not well visualized. Aorta: The aortic root is normal in size and structure. Venous: The inferior vena cava is normal in size with greater than 50% respiratory variability, suggesting right atrial pressure of 3 mmHg. IAS/Shunts: The interatrial septum was not well visualized.  LEFT VENTRICLE PLAX 2D LVIDd:         5.70 cm      Diastology LVIDs:         4.50 cm      LV e' medial:    5.66 cm/s LV PW:         1.00 cm      LV E/e' medial:  9.5 LV IVS:        1.10 cm      LV e' lateral:   10.00 cm/s LVOT diam:     2.50 cm      LV E/e' lateral: 5.4 LV SV:         54 LV SV Index:   30 LVOT Area:     4.91 cm  LV Volumes (MOD) LV vol d, MOD A2C: 99.6 ml LV vol d, MOD A4C: 122.0 ml LV vol s, MOD A2C: 59.5 ml LV vol s, MOD A4C: 70.9 ml LV SV MOD A2C:     40.1 ml LV SV MOD A4C:     122.0 ml LV SV MOD BP:      46.6 ml RIGHT VENTRICLE             IVC RV S prime:  12.00 cm/s  IVC diam: 1.80 cm TAPSE (M-mode): 2.1 cm LEFT ATRIUM             Index        RIGHT ATRIUM           Index LA diam:        3.10 cm 1.72 cm/m   RA Area:     10.70 cm LA Vol (A2C):   37.0 ml 20.48 ml/m  RA Volume:   21.20 ml  11.73 ml/m LA Vol (A4C):   33.6 ml 18.59 ml/m LA Biplane Vol: 36.7 ml 20.31 ml/m  AORTIC VALVE LVOT Vmax:   83.10 cm/s LVOT Vmean:  54.700 cm/s LVOT VTI:    0.109 m  AORTA Ao Root diam: 3.70 cm MITRAL VALVE MV Area (PHT): 6.32 cm    SHUNTS MV Decel Time: 120 msec    Systemic VTI:  0.11 m MV E velocity: 54.00 cm/s  Systemic Diam: 2.50 cm MV A velocity: 90.80 cm/s MV E/A ratio:  0.59 Mihai Croitoru MD Electronically signed by Sanda Klein MD Signature Date/Time: 06/12/2022/10:54:52 AM    Final    CT ANGIO HEAD NECK W WO CM  Result Date: 06/12/2022 CLINICAL DATA:  Follow-up examination for stroke. EXAM: CT ANGIOGRAPHY HEAD AND NECK TECHNIQUE: Multidetector CT imaging of the head and neck was performed using the standard protocol during bolus administration of intravenous contrast.  Multiplanar CT image reconstructions and MIPs were obtained to evaluate the vascular anatomy. Carotid stenosis measurements (when applicable) are obtained utilizing NASCET criteria, using the distal internal carotid diameter as the denominator. RADIATION DOSE REDUCTION: This exam was performed according to the departmental dose-optimization program which includes automated exposure control, adjustment of the mA and/or kV according to patient size and/or use of iterative reconstruction technique. CONTRAST:  16m OMNIPAQUE IOHEXOL 350 MG/ML SOLN COMPARISON:  Prior MRI from earlier the same day. FINDINGS: CT HEAD FINDINGS Brain: Age-related cerebral atrophy. Previously identified small ischemic infarcts not visible by CT. No other acute large vessel territory infarct. No acute intracranial hemorrhage. No mass lesion or midline shift. No hydrocephalus or extra-axial fluid collection. Vascular: No abnormal hyperdense vessel. Scattered vascular calcifications noted within the carotid siphons. Skull: Scalp soft tissues and calvarium demonstrate no acute finding. Sinuses/Orbits: Globes orbital soft tissues within normal limits. Paranasal sinuses are clear. No mastoid effusion. Other: None. Review of the MIP images confirms the above findings CTA NECK FINDINGS Aortic arch: Visualized aortic arch within normal limits for caliber with standard branch pattern. Atheromatous change about the origin of the great vessels without hemodynamically significant stenosis. Right carotid system: Right common and internal carotid arteries are patent without dissection. Moderate atheromatous change about the right carotid bulb without hemodynamically significant greater than 50% stenosis. Left carotid system: Left common and internal carotid arteries are patent without dissection. Moderate atheromatous change about the left carotid bulb without hemodynamically significant greater than 50% stenosis. Vertebral arteries: Both vertebral arteries  arise from the subclavian arteries. No proximal subclavian artery stenosis. Right vertebral artery dominant. Vertebral arteries patent without dissection or hemodynamically significant stenosis. Skeleton: No worrisome osseous lesions. Moderate degenerative spondylosis noted at C3-4 through C6-7. Other neck: No other acute soft tissue abnormality within the neck. Nasogastric tube in place. 12 mm left thyroid nodule, of doubtful significance given size and patient age, no follow-up imaging recommended (ref: J Am Coll Radiol. 2015 Feb;12(2): 143-50). Upper chest: Emphysema noted. Parenchymal opacities within the visualized right upper and lower lobes, suspicious for pneumonia. Layering  bilateral pleural effusions, right greater than left. Few small pulmonary nodules measuring up to 7 mm at the right lung apex, stable from prior. Review of the MIP images confirms the above findings CTA HEAD FINDINGS Anterior circulation: Both internal carotid arteries are patent to the termini without stenosis. Tiny 2 mm outpouching extending laterally from the proximal cavernous left ICA, suspicious for a small aneurysm (series 8, image 109). A1 segments patent bilaterally. Normal anterior communicating artery complex. Both anterior cerebral arteries widely patent without stenosis. No M1 stenosis or occlusion. No proximal MCA branch occlusion or high-grade stenosis. Distal MCA branches perfused and symmetric. Posterior circulation: Both V4 segments are widely patent. Both PICA patent. Basilar patent without stenosis superior cerebellar arteries patent bilaterally. Left PCA supplied via the basilar. Right PCA supplied via a hypoplastic right P1 segment and robust right posterior communicating artery. Both PCAs patent to their distal aspects without stenosis. Venous sinuses: Patent allowing for timing the contrast bolus. Anatomic variants: As above. Review of the MIP images confirms the above findings IMPRESSION: CT HEAD IMPRESSION: No  acute intracranial abnormality. Previously identified small ischemic infarcts not visible by CT. CTA HEAD AND NECK IMPRESSION: 1. Negative CTA for large vessel occlusion or other emergent finding. 2. Moderate atheromatous change about the carotid bifurcations without hemodynamically significant greater than 50% stenosis. 3. 2 mm outpouching extending laterally from the proximal cavernous left ICA, suspicious for a small aneurysm. 4. Parenchymal opacities within the partially visualized right lung, suspicious for pneumonia. 5. Layering bilateral pleural effusions, right greater than left. 6. Few small pulmonary nodules measuring up to 7 mm at the right lung apex, stable from prior. Continued surveillance recommended. Aortic Atherosclerosis (ICD10-I70.0) and Emphysema (ICD10-J43.9). Electronically Signed   By: Jeannine Boga M.D.   On: 06/12/2022 05:47   MR BRAIN W WO CONTRAST  Result Date: 06/11/2022 CLINICAL DATA:  History of lung cancer, presents with fatigue, confusion, unresponsiveness. EXAM: MRI HEAD WITHOUT AND WITH CONTRAST TECHNIQUE: Multiplanar, multiecho pulse sequences of the brain and surrounding structures were obtained without and with intravenous contrast. CONTRAST:  6.76m GADAVIST GADOBUTROL 1 MMOL/ML IV SOLN COMPARISON:  CT head 06/07/2022, brain MRI 01/04/2020 FINDINGS: Brain: There are small foci of diffusion restriction in the left frontal cortex (5-37, 5-28) and white matter (5-34), right occipital cortex (5-19), and bilateral cerebellar hemispheres (5-12, 5-10). There is associated FLAIR signal abnormality involving the focus in the left centrum semiovale. There is no associated enhancement. Findings are most in keeping with small acute infarcts, possibly embolic. There is no hemorrhage or mass effect. There is no acute intracranial hemorrhage or extra-axial fluid collection Parenchymal volume is normal. The ventricles are normal in size. Parenchymal signal is otherwise normal, with no  significant burden of underlying white matter microangiopathic change. There is no mass lesion or abnormal enhancement. There is no mass effect or midline shift. Vascular: Normal flow voids. Skull and upper cervical spine: Normal marrow signal. Sinuses/Orbits: The paranasal sinuses are clear. The globes and orbits are unremarkable. Other: There is subcentimeter probable sebaceous cyst in the left cheek. The pituitary and suprasellar region are normal. IMPRESSION: 1. 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 2. No evidence of intracranial metastatic disease. Electronically Signed   By: PValetta MoleM.D.   On: 06/11/2022 12:40   DG Abd 1 View  Result Date: 06/10/2022 CLINICAL DATA:  Nasogastric tube placement EXAM: ABDOMEN - 1 VIEW COMPARISON:  06/07/2022  FINDINGS: Nasoenteric feeding tube tip is seen overlying the expected distal body of the stomach. Normal abdominal gas pattern. No organomegaly. Pelvis excluded from view. Lumbar dextroscoliosis noted with superimposed degenerative change. IMPRESSION: 1. Nasoenteric feeding tube tip within the distal body of the stomach. Electronically Signed   By: Fidela Salisbury M.D.   On: 06/10/2022 22:37    Scheduled Meds:   stroke: early stages of recovery book   Does not apply Once   acetaminophen (TYLENOL) oral liquid 160 mg/5 mL  1,000 mg Oral TID   amLODipine  5 mg Oral Daily   aspirin  81 mg Per Tube Daily   atorvastatin  20 mg Oral Daily   Chlorhexidine Gluconate Cloth  6 each Topical Daily   cholestyramine light  4 g Per Tube BID   clopidogrel  75 mg Per Tube Daily   feeding supplement  237 mL Oral TID BM   feeding supplement (PROSource TF20)  60 mL Per Tube Daily   loperamide HCl  2 mg Per Tube BID   mirtazapine  7.5 mg Oral QHS   sucralfate  1 g Oral TID WC & HS   Continuous Infusions:  sodium chloride 10 mL/hr at 06/07/22 0645    cefTRIAXone (ROCEPHIN)  IV Stopped (06/11/22 1323)   feeding supplement (OSMOLITE 1.5 CAL) 60 mL/hr at 06/12/22 0800   fluconazole (DIFLUCAN) IV       LOS: 7 days   Shelly Coss, MD [facility] P2/27/2024, 11:59 AM

## 2022-06-12 NOTE — Progress Notes (Signed)
[facility] for Infectious Disease    Date of Admission:  06/05/2022   Total days of antibiotics 8   ID: Jesse [name] is a 72 y.o. male with  klebsiella bacteremia Principal Problem:   Severe sepsis ([place]) Active Problems:   Dyslipidemia   Essential hypertension   Adenocarcinoma of left lung, stage 2 (HCC)   Malignant neoplasm of lung (HCC)   Hypokalemia   AKI (acute kidney injury) ([place])   Hyponatremia   Acute metabolic encephalopathy   Acute respiratory failure with hypoxia (HCC)   CAP (community acquired pneumonia)   Thrombocytopenia (HCC)   Sepsis ([place])   Diarrhea   Bacteremia due to Klebsiella pneumoniae   Pneumonia of right middle lobe due to Klebsiella pneumoniae ([place])   Drug-induced neutropenia (HCC)   Protein-calorie malnutrition, severe    Subjective: Afebrile, "feel like I have been hit by a truck"; wife reports he is improving with 2 days of being more alert. Still painful to eat Medications:   acetaminophen (TYLENOL) oral liquid 160 mg/5 mL  1,000 mg Oral TID   aspirin  81 mg Per Tube Daily   atorvastatin  20 mg Oral Daily   Chlorhexidine Gluconate Cloth  6 each Topical Daily   cholestyramine light  4 g Per Tube BID   clopidogrel  75 mg Per Tube Daily   feeding supplement  237 mL Oral TID BM   feeding supplement (PROSource TF20)  60 mL Per Tube Daily   loperamide HCl  2 mg Per Tube BID   losartan  50 mg Oral Daily   metoprolol tartrate  25 mg Oral BID   mirtazapine  7.5 mg Oral QHS   [START ON 06/13/2022] nystatin  5 mL Oral QID   sucralfate  1 g Oral TID WC & HS    Objective: Vital signs in last 24 hours: Temp:  [97.9 F (36.6 C)-99 F (37.2 C)] 97.9 F (36.6 C) (02/27 0800) Pulse Rate:  [100-115] 103 (02/27 1200) Resp:  [16-24] 22 (02/27 1200) BP: (125-157)/(73-102) 146/78 (02/27 1336) SpO2:  [94 %-99 %] 99 % (02/27 1200) Weight:  [66.2 kg] 66.2 kg (02/27 0437)  Physical Exam  Constitutional: He is oriented to person, place, and time.  He appears frail, and under-nourished. No distress.  HENT: ng in place Mouth/Throat: Oropharynx is clear and moist. No oropharyngeal exudate.  Cardiovascular: Normal rate, regular rhythm and normal heart sounds. Exam reveals no gallop and no friction rub.  No murmur heard.  Pulmonary/Chest: Effort normal and breath sounds normal. No respiratory distress. He has no wheezes.  Abdominal: Soft. Bowel sounds are normal. He exhibits no distension. There is no tenderness.  Lymphadenopathy:  He has no cervical adenopathy.  Neurological: He is alert and oriented to person, place, and time.  Skin: Skin is warm and dry. No rash noted. No erythema.  Psychiatric: He has a normal mood and affect. His behavior is normal.    Lab Results Recent Labs    06/10/22 0256 06/10/22 1812 06/11/22 0316 06/12/22 0330  WBC 4.6  --  3.9* 5.1  HGB 7.4*   < > 8.5* 9.0*  HCT 22.6*   < > 26.2* 27.6*  NA 140  --   --  138  K 4.0  --   --  4.3  CL 113*  --   --  106  CO2 24  --   --  25  BUN 36*  --   --  29*  CREATININE 0.78  --   --  0.53*   < > = values in this interval not displayed.   Liver Panel Recent Labs    06/12/22 0330  PROT 5.5*  ALBUMIN 2.1*  AST 24  ALT 21  ALKPHOS 104  BILITOT 0.5    Microbiology: Klebsiella pneumoniae      MIC    AMPICILLIN RESISTANT Resistant    AMPICILLIN/SULBACTAM 4 SENSITIVE Sensitive    CEFEPIME <=0.12 SENS... Sensitive    CEFTAZIDIME <=1 SENSITIVE Sensitive    CEFTRIAXONE <=0.25 SENS... Sensitive    CIPROFLOXACIN <=0.25 SENS... Sensitive    GENTAMICIN <=1 SENSITIVE Sensitive    IMIPENEM <=0.25 SENS... Sensitive    PIP/TAZO <=4 SENSITIVE Sensitive    TRIMETH/SULFA <=20 SENSIT... Sensitive    Studies/Results: ECHOCARDIOGRAM COMPLETE  Result Date: 06/12/2022    ECHOCARDIOGRAM REPORT   Patient Name:   Jesse [name] Date of Exam: 06/12/2022 Medical Rec #:  [id number]       Height:       69.0 in Accession #:    [id number]      Weight:       145.9 lb Date of  Birth:  [birth year]       BSA:          1.807 m Patient Age:    71 years        BP:           141/96 mmHg Patient Gender: M               HR:           106 bpm. Exam Location:  Inpatient Procedure: 2D Echo, Cardiac Doppler, Color Doppler and Intracardiac            Opacification Agent Indications:    Stroke  History:        Patient has no prior history of Echocardiogram examinations.                 Signs/Symptoms:Bacteremia; Risk Factors:Hypertension and                 Dyslipidemia. Lung cancer. Chemo.  Sonographer:    Roseanna Rainbow RDCS Referring Phys: [id number] AMRIT ADHIKARI  Sonographer Comments: Technically difficult study due to poor echo windows, Technically challenging study due to limited acoustic windows, suboptimal parasternal window, suboptimal apical window and suboptimal subcostal window. Extremely difficult study. IMPRESSIONS  1. Left ventricular ejection fraction, by estimation, is 40 to 45%. The left ventricle has mildly decreased function. The left ventricle demonstrates global hypokinesis. The left ventricular internal cavity size was mildly dilated. Indeterminate diastolic filling due to E-A fusion.  2. Right ventricular systolic function is normal. The right ventricular size is normal. Tricuspid regurgitation signal is inadequate for assessing PA pressure.  3. The mitral valve is grossly normal. No evidence of mitral valve regurgitation.  4. The aortic valve is grossly normal. Aortic valve regurgitation is not visualized. No aortic stenosis is present.  5. The inferior vena cava is normal in size with greater than 50% respiratory variability, suggesting right atrial pressure of 3 mmHg. Conclusion(s)/Recommendation(s): No evidence of valvular vegetations on this transthoracic echocardiogram. Consider a transesophageal echocardiogram to exclude infective endocarditis if clinically indicated. FINDINGS  Left Ventricle: No left ventricular thrombus is seen (Definity contrast used). Left ventricular  ejection fraction, by estimation, is 40 to 45%. The left ventricle has mildly decreased function. The left ventricle demonstrates global hypokinesis. Definity contrast agent was given IV to delineate the left ventricular endocardial borders. The left ventricular internal  cavity size was mildly dilated. There is no left ventricular hypertrophy. Indeterminate diastolic filling due to E-A fusion. Right Ventricle: The right ventricular size is normal. No increase in right ventricular wall thickness. Right ventricular systolic function is normal. Tricuspid regurgitation signal is inadequate for assessing PA pressure. Left Atrium: Left atrial size was normal in size. Right Atrium: Right atrial size was normal in size. Pericardium: There is no evidence of pericardial effusion. Mitral Valve: The mitral valve is grossly normal. Mild mitral annular calcification. No evidence of mitral valve regurgitation. Tricuspid Valve: The tricuspid valve is normal in structure. Tricuspid valve regurgitation is not demonstrated. Aortic Valve: The aortic valve is grossly normal. Aortic valve regurgitation is not visualized. No aortic stenosis is present. Pulmonic Valve: The pulmonic valve was not well visualized. Aorta: The aortic root is normal in size and structure. Venous: The inferior vena cava is normal in size with greater than 50% respiratory variability, suggesting right atrial pressure of 3 mmHg. IAS/Shunts: The interatrial septum was not well visualized.  LEFT VENTRICLE PLAX 2D LVIDd:         5.70 cm      Diastology LVIDs:         4.50 cm      LV e' medial:    5.66 cm/s LV PW:         1.00 cm      LV E/e' medial:  9.5 LV IVS:        1.10 cm      LV e' lateral:   10.00 cm/s LVOT diam:     2.50 cm      LV E/e' lateral: 5.4 LV SV:         54 LV SV Index:   30 LVOT Area:     4.91 cm  LV Volumes (MOD) LV vol d, MOD A2C: 99.6 ml LV vol d, MOD A4C: 122.0 ml LV vol s, MOD A2C: 59.5 ml LV vol s, MOD A4C: 70.9 ml LV SV MOD A2C:     40.1 ml  LV SV MOD A4C:     122.0 ml LV SV MOD BP:      46.6 ml RIGHT VENTRICLE             IVC RV S prime:     12.00 cm/s  IVC diam: 1.80 cm TAPSE (M-mode): 2.1 cm LEFT ATRIUM             Index        RIGHT ATRIUM           Index LA diam:        3.10 cm 1.72 cm/m   RA Area:     10.70 cm LA Vol (A2C):   37.0 ml 20.48 ml/m  RA Volume:   21.20 ml  11.73 ml/m LA Vol (A4C):   33.6 ml 18.59 ml/m LA Biplane Vol: 36.7 ml 20.31 ml/m  AORTIC VALVE LVOT Vmax:   83.10 cm/s LVOT Vmean:  54.700 cm/s LVOT VTI:    0.109 m  AORTA Ao Root diam: 3.70 cm MITRAL VALVE MV Area (PHT): 6.32 cm    SHUNTS MV Decel Time: 120 msec    Systemic VTI:  0.11 m MV E velocity: 54.00 cm/s  Systemic Diam: 2.50 cm MV A velocity: 90.80 cm/s MV E/A ratio:  0.59 Mihai Croitoru MD Electronically signed by Sanda Klein MD Signature Date/Time: 06/12/2022/10:54:52 AM    Final    CT ANGIO HEAD NECK W WO CM  Result Date:  06/12/2022 CLINICAL DATA:  Follow-up examination for stroke. EXAM: CT ANGIOGRAPHY HEAD AND NECK TECHNIQUE: Multidetector CT imaging of the head and neck was performed using the standard protocol during bolus administration of intravenous contrast. Multiplanar CT image reconstructions and MIPs were obtained to evaluate the vascular anatomy. Carotid stenosis measurements (when applicable) are obtained utilizing NASCET criteria, using the distal internal carotid diameter as the denominator. RADIATION DOSE REDUCTION: This exam was performed according to the departmental dose-optimization program which includes automated exposure control, adjustment of the mA and/or kV according to patient size and/or use of iterative reconstruction technique. CONTRAST:  16m OMNIPAQUE IOHEXOL 350 MG/ML SOLN COMPARISON:  Prior MRI from earlier the same day. FINDINGS: CT HEAD FINDINGS Brain: Age-related cerebral atrophy. Previously identified small ischemic infarcts not visible by CT. No other acute large vessel territory infarct. No acute intracranial hemorrhage.  No mass lesion or midline shift. No hydrocephalus or extra-axial fluid collection. Vascular: No abnormal hyperdense vessel. Scattered vascular calcifications noted within the carotid siphons. Skull: Scalp soft tissues and calvarium demonstrate no acute finding. Sinuses/Orbits: Globes orbital soft tissues within normal limits. Paranasal sinuses are clear. No mastoid effusion. Other: None. Review of the MIP images confirms the above findings CTA NECK FINDINGS Aortic arch: Visualized aortic arch within normal limits for caliber with standard branch pattern. Atheromatous change about the origin of the great vessels without hemodynamically significant stenosis. Right carotid system: Right common and internal carotid arteries are patent without dissection. Moderate atheromatous change about the right carotid bulb without hemodynamically significant greater than 50% stenosis. Left carotid system: Left common and internal carotid arteries are patent without dissection. Moderate atheromatous change about the left carotid bulb without hemodynamically significant greater than 50% stenosis. Vertebral arteries: Both vertebral arteries arise from the subclavian arteries. No proximal subclavian artery stenosis. Right vertebral artery dominant. Vertebral arteries patent without dissection or hemodynamically significant stenosis. Skeleton: No worrisome osseous lesions. Moderate degenerative spondylosis noted at C3-4 through C6-7. Other neck: No other acute soft tissue abnormality within the neck. Nasogastric tube in place. 12 mm left thyroid nodule, of doubtful significance given size and patient age, no follow-up imaging recommended (ref: J Am Coll Radiol. 2015 Feb;12(2): 143-50). Upper chest: Emphysema noted. Parenchymal opacities within the visualized right upper and lower lobes, suspicious for pneumonia. Layering bilateral pleural effusions, right greater than left. Few small pulmonary nodules measuring up to 7 mm at the right  lung apex, stable from prior. Review of the MIP images confirms the above findings CTA HEAD FINDINGS Anterior circulation: Both internal carotid arteries are patent to the termini without stenosis. Tiny 2 mm outpouching extending laterally from the proximal cavernous left ICA, suspicious for a small aneurysm (series 8, image 109). A1 segments patent bilaterally. Normal anterior communicating artery complex. Both anterior cerebral arteries widely patent without stenosis. No M1 stenosis or occlusion. No proximal MCA branch occlusion or high-grade stenosis. Distal MCA branches perfused and symmetric. Posterior circulation: Both V4 segments are widely patent. Both PICA patent. Basilar patent without stenosis superior cerebellar arteries patent bilaterally. Left PCA supplied via the basilar. Right PCA supplied via a hypoplastic right P1 segment and robust right posterior communicating artery. Both PCAs patent to their distal aspects without stenosis. Venous sinuses: Patent allowing for timing the contrast bolus. Anatomic variants: As above. Review of the MIP images confirms the above findings IMPRESSION: CT HEAD IMPRESSION: No acute intracranial abnormality. Previously identified small ischemic infarcts not visible by CT. CTA HEAD AND NECK IMPRESSION: 1. Negative CTA for large vessel  occlusion or other emergent finding. 2. Moderate atheromatous change about the carotid bifurcations without hemodynamically significant greater than 50% stenosis. 3. 2 mm outpouching extending laterally from the proximal cavernous left ICA, suspicious for a small aneurysm. 4. Parenchymal opacities within the partially visualized right lung, suspicious for pneumonia. 5. Layering bilateral pleural effusions, right greater than left. 6. Few small pulmonary nodules measuring up to 7 mm at the right lung apex, stable from prior. Continued surveillance recommended. Aortic Atherosclerosis (ICD10-I70.0) and Emphysema (ICD10-J43.9). Electronically  Signed   By: Jeannine Boga M.D.   On: 06/12/2022 05:47   MR BRAIN W WO CONTRAST  Result Date: 06/11/2022 CLINICAL DATA:  History of lung cancer, presents with fatigue, confusion, unresponsiveness. EXAM: MRI HEAD WITHOUT AND WITH CONTRAST TECHNIQUE: Multiplanar, multiecho pulse sequences of the brain and surrounding structures were obtained without and with intravenous contrast. CONTRAST:  6.77m GADAVIST GADOBUTROL 1 MMOL/ML IV SOLN COMPARISON:  CT head 06/07/2022, brain MRI 01/04/2020 FINDINGS: Brain: There are small foci of diffusion restriction in the left frontal cortex (5-37, 5-28) and white matter (5-34), right occipital cortex (5-19), and bilateral cerebellar hemispheres (5-12, 5-10). There is associated FLAIR signal abnormality involving the focus in the left centrum semiovale. There is no associated enhancement. Findings are most in keeping with small acute infarcts, possibly embolic. There is no hemorrhage or mass effect. There is no acute intracranial hemorrhage or extra-axial fluid collection Parenchymal volume is normal. The ventricles are normal in size. Parenchymal signal is otherwise normal, with no significant burden of underlying white matter microangiopathic change. There is no mass lesion or abnormal enhancement. There is no mass effect or midline shift. Vascular: Normal flow voids. Skull and upper cervical spine: Normal marrow signal. Sinuses/Orbits: The paranasal sinuses are clear. The globes and orbits are unremarkable. Other: There is subcentimeter probable sebaceous cyst in the left cheek. The pituitary and suprasellar region are normal. IMPRESSION: 1. 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 2. No evidence of intracranial metastatic disease. Electronically Signed   By: PValetta MoleM.D.   On: 06/11/2022 12:40   DG Abd 1 View  Result Date: 06/10/2022 CLINICAL DATA:   Nasogastric tube placement EXAM: ABDOMEN - 1 VIEW COMPARISON:  06/07/2022 FINDINGS: Nasoenteric feeding tube tip is seen overlying the expected distal body of the stomach. Normal abdominal gas pattern. No organomegaly. Pelvis excluded from view. Lumbar dextroscoliosis noted with superimposed degenerative change. IMPRESSION: 1. Nasoenteric feeding tube tip within the distal body of the stomach. Electronically Signed   By: AFidela SalisburyM.D.   On: 06/10/2022 22:37     Assessment/Plan: Klebsiella pneumonia with bacteremia = appears improving. Currently on day 8 of 10 of iv abtx. Prefer to finish out treatment with IV since having difficulty with swallowing. Can transition to amox/clav if it is interfering with discharge planning  Thrush = can start nystatin swish and swallow +/- magic mouthwash to help with symptoms.   [facility] Pager: [phone]  06/12/2022, 2:20 PM

## 2022-06-12 NOTE — Progress Notes (Signed)
Calorie Count Note  48 hour calorie count ordered.  Diet: DYS 3 Supplements: Ensure Plus High Protein TID  Nothing documented for any meals. Patient received a breakfast and a lunch tray but unable to determine if patient ate anything due to insubstantial data at this time.  Supplements: 2 Ensure Plus High Protein  Total intake (all from supplements): 700 kcal (36% of minimum estimated needs)  40 protein (36% of minimum estimated needs)   Nutrition Dx: Severe Malnutrition related to chronic illness (lung cancer currently undergoing chemotherapy and radiation therapy) as evidenced by severe fat depletion, severe muscle depletion, percent weight loss (15% in 6 months).   Goal: Patient will meet greater than or equal to 90% of their needs    Intervention:  - Calorie count to run through at least the end of today, 2/27.   - Please document % intake of all food, drinks, and supplements patient consumes.  - Discussed with RN.  -Ensure Plus High Protein po TID, each supplement provides 350 kcal and 20 grams of protein. (Pt's wife to bring Ensure Complete from home, provides 350 kcals and 30g protein per bottle). Provide EHP from unit if Ensure from home not available.   Monitor magnesium, potassium, and phosphorus for at least 3 days, MD to replete as needed, as pt is at VERY high risk for refeeding syndrome given severe malnutrition.    Continue TF via NGT: -Osmolite 1.5 @ 20 ml/hr and increase by 10 ml every 12 hours to goal rate of 60 ml/hr.  - 60 ml Prosource TF daily -Tube feeding regimen provides 2240 kcals, 110 grams of protein, and 1097 ml of H2O.     -Continue 100 mg thiamine x 5 days -continue until 2/27   Recommend continue tube feeding at this time as patient is not showing adequate intake.   Samson Frederic RD, LDN For contact information, refer to [facility].

## 2022-06-12 NOTE — Evaluation (Addendum)
SLP Cancellation Note  Patient Details Name: Jesse [name] MRN: [id number] DOB: 06-21-1950   Cancelled treatment:       Reason Eval/Treat Not Completed: Patient at procedure or test/unavailable (pt at radiation)  Kathleen Lime, MS [facility] SLP [place] Office [phone]  [name] 06/12/2022, 11:57 AM

## 2022-06-12 NOTE — Progress Notes (Signed)
  Echocardiogram 2D Echocardiogram has been performed.  Jesse [name] 06/12/2022, 10:14 AM

## 2022-06-12 NOTE — Progress Notes (Signed)
Pharmacy Antibiotic Note  Jesse [name] is a 72 y.o. male admitted on 06/05/2022. Pharmacy consulted to dose fluconazole for oropharyngeal candidiasis.   Today, 06/12/22 -WBC WNL -SCR WNL  Currently on ceftriaxone for Klebsiella bacteremia.   Plan: Fluconazole 200 mg IV q24h Monitor renal function  Height: '5\' 9"'$  (175.3 cm) Weight: 66.2 kg (145 lb 15.1 oz) IBW/kg (Calculated) : 70.7  Temp (24hrs), Avg:98.3 F (36.8 C), Min:97.9 F (36.6 C), Max:99 F (37.2 C)  Recent Labs  Lab 06/05/22 1846 06/06/22 0035 06/06/22 0603 06/08/22 0537 06/08/22 1532 06/09/22 0325 06/09/22 0345 06/10/22 0256 06/11/22 0316 06/12/22 0330  WBC 1.1* 0.4*   < > 4.5  --   --  6.2 4.6 3.9* 5.1  CREATININE 1.85* 1.79*   < > 0.96 1.02 0.78  --  0.78  --  0.53*  LATICACIDVEN 1.5 2.6*  --  1.0  --   --   --   --   --   --    < > = values in this interval not displayed.    Estimated Creatinine Clearance: 78.2 mL/min (A) (by C-G formula based on SCr of 0.53 mg/dL (L)).    No Known Allergies  Antimicrobials this admission: ceftriaxone 2/20 >>  fluconazole 2/27 >>   Dose adjustments this admission:  Microbiology results: 2/20 BCx: 2/4 Klebsiella pneumoniae (R amp)  Lenis Noon, PharmD 06/12/2022 12:12 PM

## 2022-06-12 NOTE — Progress Notes (Signed)
Bilateral lower extremity venous duplex has been completed. Preliminary results can be found in CV Proc through chart review.   06/12/22 4:19 PM Carlos Levering RVT

## 2022-06-12 NOTE — Progress Notes (Signed)
Occupational Therapy Treatment Patient Details Name: Jesse [name] MRN: [id number] DOB: 1951/02/10 Today's Date: 06/12/2022   History of present illness Patient is a 72 year old male with significant hx of lung and bladder CA currently on chemo and radiation treatments, who presented with severe fatigue and confusion, cyanosis and soft BPs. Admitted with sepsis from unspecified organism and PNA R lung. PMH CA, COPD, HLD, lung mass, bladder CA, macular degeneration, hx ileostomy, B knee surgery   OT comments  Patient was noted to have slow progress towards goals. Patient was able to tolerate briefly on this date with increased confusion and decreased functional activity tolerance noted. Patient would benefit from continued skilled OT services during acute stay and at [facility] rehab to improve independence in [place], prevent learned helplessness, and to reduce caregiver burden in next level of care.    Recommendations for follow up therapy are one component of a multi-disciplinary discharge planning process, led by the attending physician.  Recommendations may be updated based on patient status, additional functional criteria and insurance authorization.    Follow Up Recommendations  Skilled nursing-short term rehab (<3 hours/day)     Assistance Recommended at Discharge Frequent or constant Supervision/Assistance  Patient can return home with the following  A lot of help with walking and/or transfers;A lot of help with bathing/dressing/bathroom;Assistance with cooking/housework;Direct supervision/assist for medications management;Assist for transportation;Help with stairs or ramp for entrance   Equipment Recommendations  BSC/3in1    Recommendations for Other Services      Precautions / Restrictions Precautions Precautions: Fall;Other (comment) Precaution Comments: watch O2/HR/BPs, NG TF Restrictions Weight Bearing Restrictions: No       Mobility Bed Mobility Overal bed mobility: Needs  Assistance Bed Mobility: Rolling, Sidelying to Sit, Sit to Supine Rolling: Min guard Sidelying to sit: Mod assist Supine to sit: Min assist     General bed mobility comments: mod assist with trunk to sitting, able to place legs back onto bed    Transfers                         Balance Overall balance assessment: Needs assistance Sitting-balance support: Bilateral upper extremity supported, Feet supported Sitting balance-[name]: Fair Sitting balance - Comments: leanign over walker or hands propped on bed   Standing balance support: During functional activity, Reliant on assistive device for balance, Bilateral upper extremity supported Standing balance-[name]: Poor Standing balance comment: reliant on external support from therapists                           ADL either performed or assessed with clinical judgement   ADL Overall ADL's : Needs assistance/impaired       Grooming Details (indicate cue type and reason): patient declined to participate in grooming tasks reporting that he didint feel like it.                               General ADL Comments: Patient attempted standing EOB with RW with patient able to take few side steps up bed with physical A needed to control landing on bed.      Cognition Arousal/Alertness: Awake/alert Behavior During Therapy: WFL for tasks assessed/performed, Flat affect Overall Cognitive Status: No family/caregiver present to determine baseline cognitive functioning Area of Impairment: Attention, Memory, Awareness, Safety/judgement, Following commands  Current Attention Level: Sustained Memory: Decreased short-term memory Following Commands: Follows one step commands with increased time       General Comments: pt.orientred to place, not date, asking why he is in hospital        Exercises Other Exercises Other Exercises: patient was educated on gentle ROM for BUE  to reduce edema. patient demonstraetd understanding with 10 reps of hand wrist and elbow movements. Other Exercises: patient educaetd on using blue stress ball to keep hands moble. patietn demonstrated understanding.            Pertinent Vitals/ Pain       Pain Assessment Pain Assessment: No/denies pain         Frequency  Min 2X/week        Progress Toward Goals  OT Goals(current goals can now be found in the care plan section)  Progress towards OT goals: Progressing toward goals     Plan Discharge plan remains appropriate       AM-PAC OT "6 Clicks" Daily Activity     Outcome Measure   Help from another person eating meals?: None Help from another person taking care of personal grooming?: A Little Help from another person toileting, which includes using toliet, bedpan, or urinal?: A Lot Help from another person bathing (including washing, rinsing, drying)?: A Lot Help from another person to put on and taking off regular upper body clothing?: A Little Help from another person to put on and taking off regular lower body clothing?: A Lot 6 Click Score: 16    End of Session Equipment Utilized During Treatment: Rolling walker (2 wheels)  OT Visit Diagnosis: Other abnormalities of gait and mobility (R26.89);Unsteadiness on feet (R26.81);Muscle weakness (generalized) (M62.81);Other symptoms and signs involving cognitive function;Pain   Activity Tolerance Patient limited by fatigue   Patient Left with call bell/phone within reach;in chair;with chair alarm set   Nurse Communication Mobility status        Time: 0912-0930 OT Time Calculation (min): 18 min  Charges: OT General Charges $OT Visit: 1 Visit OT Treatments $Therapeutic Activity: 8-22 mins  Rennie Plowman, MS Acute Rehabilitation Department Office# [phone]   Willa Rough 06/12/2022, 2:12 PM

## 2022-06-13 ENCOUNTER — Ambulatory Visit
Admission: RE | Admit: 2022-06-13 | Discharge: 2022-06-13 | Disposition: A | Discharge status: Home / Self Care | Payer: Medicare Other | Source: Ambulatory Visit | Attending: Radiation Oncology | Admitting: Radiation Oncology

## 2022-06-13 ENCOUNTER — Inpatient Hospital Stay (INDEPENDENT_AMBULATORY_CARE_PROVIDER_SITE_OTHER): Payer: Medicare Other

## 2022-06-13 ENCOUNTER — Other Ambulatory Visit: Payer: Self-pay | Admitting: Physician Assistant

## 2022-06-13 ENCOUNTER — Other Ambulatory Visit: Payer: Self-pay

## 2022-06-13 DIAGNOSIS — R000 Tachycardia, unspecified: Secondary | ICD-10-CM

## 2022-06-13 DIAGNOSIS — R7881 Bacteremia: Secondary | ICD-10-CM

## 2022-06-13 DIAGNOSIS — C3412 Malignant neoplasm of upper lobe, left bronchus or lung: Secondary | ICD-10-CM | POA: Diagnosis not present

## 2022-06-13 DIAGNOSIS — D701 Agranulocytosis secondary to cancer chemotherapy: Secondary | ICD-10-CM | POA: Diagnosis not present

## 2022-06-13 DIAGNOSIS — K208 Other esophagitis without bleeding: Secondary | ICD-10-CM

## 2022-06-13 DIAGNOSIS — I6340 Cerebral infarction due to embolism of unspecified cerebral artery: Secondary | ICD-10-CM

## 2022-06-13 DIAGNOSIS — A419 Sepsis, unspecified organism: Secondary | ICD-10-CM | POA: Diagnosis not present

## 2022-06-13 DIAGNOSIS — Z510 Encounter for antineoplastic radiation therapy: Secondary | ICD-10-CM | POA: Diagnosis not present

## 2022-06-13 DIAGNOSIS — B961 Klebsiella pneumoniae [K. pneumoniae] as the cause of diseases classified elsewhere: Secondary | ICD-10-CM

## 2022-06-13 DIAGNOSIS — C771 Secondary and unspecified malignant neoplasm of intrathoracic lymph nodes: Secondary | ICD-10-CM | POA: Diagnosis not present

## 2022-06-13 DIAGNOSIS — B370 Candidal stomatitis: Secondary | ICD-10-CM | POA: Diagnosis not present

## 2022-06-13 DIAGNOSIS — R6520 Severe sepsis without septic shock: Secondary | ICD-10-CM | POA: Diagnosis not present

## 2022-06-13 DIAGNOSIS — F17210 Nicotine dependence, cigarettes, uncomplicated: Secondary | ICD-10-CM | POA: Diagnosis not present

## 2022-06-13 DIAGNOSIS — I491 Atrial premature depolarization: Secondary | ICD-10-CM

## 2022-06-13 DIAGNOSIS — T66XXXA Radiation sickness, unspecified, initial encounter: Secondary | ICD-10-CM

## 2022-06-13 LAB — GLUCOSE, CAPILLARY
Glucose-Capillary: 103 mg/dL — ABNORMAL HIGH (ref 70–99)
Glucose-Capillary: 104 mg/dL — ABNORMAL HIGH (ref 70–99)
Glucose-Capillary: 114 mg/dL — ABNORMAL HIGH (ref 70–99)
Glucose-Capillary: 128 mg/dL — ABNORMAL HIGH (ref 70–99)
Glucose-Capillary: 147 mg/dL — ABNORMAL HIGH (ref 70–99)
Glucose-Capillary: 148 mg/dL — ABNORMAL HIGH (ref 70–99)

## 2022-06-13 LAB — RAD ONC ARIA SESSION SUMMARY
Course Elapsed Days: 29
Plan Fractions Treated to Date: 20
Plan Prescribed Dose Per Fraction: 2 Gy
Plan Total Fractions Prescribed: 30
Plan Total Prescribed Dose: 60 Gy
Reference Point Dosage Given to Date: 40 Gy
Reference Point Session Dosage Given: 2 Gy
Session Number: 20

## 2022-06-13 LAB — CBC
HCT: 28.9 % — ABNORMAL LOW (ref 39.0–52.0)
Hemoglobin: 9.5 g/dL — ABNORMAL LOW (ref 13.0–17.0)
MCH: 30.4 pg (ref 26.0–34.0)
MCHC: 32.9 g/dL (ref 30.0–36.0)
MCV: 92.6 fL (ref 80.0–100.0)
Platelets: 152 10*3/uL (ref 150–400)
RBC: 3.12 MIL/uL — ABNORMAL LOW (ref 4.22–5.81)
RDW: 14.9 % (ref 11.5–15.5)
WBC: 5 10*3/uL (ref 4.0–10.5)
nRBC: 0 % (ref 0.0–0.2)

## 2022-06-13 LAB — HEMOGLOBIN A1C
Hgb A1c MFr Bld: 6.2 % — ABNORMAL HIGH (ref 4.8–5.6)
Mean Plasma Glucose: 131 mg/dL

## 2022-06-13 MED ORDER — CALCIUM CARBONATE ANTACID 500 MG PO CHEW
1.0000 | CHEWABLE_TABLET | Freq: Two times a day (BID) | ORAL | Status: AC
  Administered 2022-06-13 – 2022-06-15 (×4): 200 mg via ORAL
  Filled 2022-06-13 (×5): qty 1

## 2022-06-13 NOTE — Progress Notes (Signed)
Heart Failure Navigator Progress Note  Assessed for Heart & Vascular TOC clinic readiness.  Patient EF 40-45%, per MD notes palliative consult for Goals of care, Hx of multiple cancers. .   Navigator will sign off at this time.   Earnestine Leys, BSN, Clinical cytogeneticist Only

## 2022-06-13 NOTE — Progress Notes (Addendum)
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[place]) s/p resection /ileostomy then undergoing ileostomy takedown on 03/22/2022 , has been on imatinib   h/o recurrent Adenocarcinoma of left lung getting concurrent radiation and chemotherapy  Present with confusion, acute hypoxia, he was unresponsive initially on presentation, found to have sepsis ,  Klebsiella bacteremia and pneumonia, also found to have small acute embolic ischemic strokes  Subjective:   Poor oral intake, continue to require nutrition per tube., + oral thrush Denies pain, reports having diarrhea  Not oriented to time, oriented to place and person  Assessment & Plan:  Principal Problem:   Severe sepsis ([place]) Active Problems:   Dyslipidemia   Essential hypertension   Adenocarcinoma of left lung, stage 2 (HCC)   Malignant neoplasm of lung (HCC)   Hypokalemia   AKI (acute kidney injury) ([place])   Hyponatremia   Acute metabolic encephalopathy   Acute respiratory failure with hypoxia (HCC)   CAP (community acquired pneumonia)   Thrombocytopenia ([place])   Sepsis ([place])   Diarrhea   Bacteremia due to Klebsiella pneumoniae   Pneumonia of right middle lobe due to Klebsiella pneumoniae ([place])   Drug-induced neutropenia ([place])   Protein-calorie malnutrition, severe    Assessment and Plan:  Severe sepsis present on admission due to Klebsiella pneumonia bacteremia and pneumonia On rocephin day 9, seen by ID recommend total of 10 days treatment Last ID note on 2/27   Acute CVAs -Incidental finding on MRI of the brain for cancer staging -Seen by neurology thought to be embolic, Patient was started on aspirin and Plavix. 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   Today he is alert, not oriented to time but to place and person    Pancytopenia: Given filgrastim x 3.  Oncology was following.  Hemoglobin in the range of 9, platelets in the range of 100.  No evidence of acute blood loss.  Status post a unit of PRBC transfusion on 2/25.  Currently hemoglobin stable   Acute on chronic diarrhea: History of anterior resection syndrome.  C. difficile positive but toxin was negative.  GI pathogen panel negative.  On  Questran, loperamide.On contact precaution.  Diarrhea has stopped  AKI Resolved   hypertension: Initially hypotensive but now blood pressure has trended up.  Continue losartan, metoprolol    Dysphagia/anorexia/thrush: Likely associated with malignancy/chemo.  Speech therapy recommended dysphagia 3 diet.  Also started on slow tube feed.  On Remeron.  Goal is to stop tube feed after adequate oral intake.  Patient wants to remove it.  Dietitian following.  He is having very poor intake so tube will remain in place for now,also started on fluconazole for thrush   Recurrent adenocarcinoma of the lung -Initially diagnosed in June 2021, status post left upper lobectomy with lymph node dissection on December 16, 2019  -repeat  video bronchoscopy with EBUS on April 11, 2022 and biopsy from the station 7 lymph node showed recurrent adenocarcinoma.  -The patient is currently undergoing a course of concurrent chemoradiation with weekly carboplatin for AUC of 2 and paclitaxel 45 Mg/M2. First dose May 14, 2022.  -Follows radiation oncology and medical oncology Dr. Julien Nordmann   GIST tumor of rectum -s/p Neoadjuvant treatment with imatinib 400 mg p.o. daily for the GIST tumor of the pelvic area. First dose started July 18, 2020. Status post 12 months of treatment. -s/p  robotic assisted very low anterior rectosigmoid resection with coloanal anastomosis, diverting loop ileostomy and wedge liver biopsy under the care of Dr. Johney Maine on December 14, 2021 and it showed minimal residual gastrointestinal stromal tumor.  -s/p undergoing ileostomy takedown on 03/22/2022  -home meds imatinib held F/u oncology Dr. Julien Nordmann   Deconditioning/weakness: PT/OT recommended SNF on discharge.  TOC following   Goals of care: Patient with history of lung cancer now with very poor oral intake, severe weakness.  Poor quality of life.  On tube feeding.  We requested palliative care consultation after discussion with wife,remains full code    Nutritional Assessment: The patient's BMI is: Body mass index is 22.07 kg/m.Marland Kitchen Seen by dietician.  I agree with the assessment and plan as outlined below: Nutrition Status: Nutrition Problem: Severe Malnutrition Etiology: chronic illness (lung cancer currently undergoing chemotherapy and radiation therapy) Signs/Symptoms: severe fat depletion, severe muscle depletion, percent weight loss (15% in 6 months) Percent weight loss: 15 % (in 6 months) Interventions: Ensure Enlive (each supplement provides 350kcal and 20 grams of protein), Tube feeding  .      I have Reviewed nursing notes, Vitals, pain scores, I/o's, Lab results and  imaging results since pt's last encounter, details please see discussion above  I ordered the following labs:  Unresulted Labs (From admission, onward)     Start     Ordered   06/14/22 [id number]  Basic metabolic panel  Tomorrow morning,   R       Question:  Specimen collection method  Answer:  Lab=Lab collect   06/13/22 1544   06/14/22 0500  Magnesium   Tomorrow morning,   R       Question:  Specimen collection method  Answer:  Lab=Lab collect   06/13/22 1544   06/14/22 0500  Phosphorus  Tomorrow morning,   R       Question:  Specimen collection method  Answer:  Lab=Lab collect   06/13/22 1544   06/13/22 1846  CBC  Every Mon-Wed-Fri,   R (with TIMED occurrences)     Question:  Specimen collection method  Answer:  Lab=Lab collect   06/13/22 1846             DVT prophylaxis: SCDs Start: 06/06/22 0153   Code Status:   Code Status: Full Code  Family Communication: Patient Disposition:   Dispo: The patient is from: Home              Anticipated d/c is to: TBD              Anticipated d/c date is: Continue recording to previous  Antimicrobials:     Anti-infectives (From admission, onward)    Start     Dose/Rate Route Frequency Ordered Stop   06/12/22 1230  fluconazole (DIFLUCAN) IVPB 200 mg        200 mg 100 mL/hr over 60 Minutes Intravenous Every 24 hours 06/12/22 1134 06/12/22 1434   06/07/22  1200  vancomycin (VANCOCIN) IVPB 1000 mg/200 mL premix  Status:  Discontinued        1,000 mg 200 mL/hr over 60 Minutes Intravenous Every 36 hours 06/05/22 2323 06/06/22 1108   06/06/22 1200  cefTRIAXone (ROCEPHIN) 2 g in sodium chloride 0.9 % 100 mL IVPB        2 g 200 mL/hr over 30 Minutes Intravenous Every 24 hours 06/06/22 1110     06/06/22 0200  ceFEPIme (MAXIPIME) 2 g in sodium chloride 0.9 % 100 mL IVPB  Status:  Discontinued        2 g 200 mL/hr over 30 Minutes Intravenous Every 12 hours 06/05/22 2323 06/06/22 1108   06/05/22 2315  vancomycin (VANCOCIN) IVPB 1000 mg/200 mL premix        1,000 mg 200 mL/hr over 60 Minutes Intravenous  Once 06/05/22 2307 06/06/22 0145   06/05/22 1900  cefTRIAXone (ROCEPHIN) 2 g in sodium chloride 0.9 % 100 mL IVPB  Status:  Discontinued        2 g 200 mL/hr over 30 Minutes Intravenous Every 24 hours 06/05/22 1847 06/05/22 2323   06/05/22 1900  azithromycin (ZITHROMAX) 500 mg in sodium  chloride 0.9 % 250 mL IVPB  Status:  Discontinued        500 mg 250 mL/hr over 60 Minutes Intravenous Every 24 hours 06/05/22 1847 06/06/22 1108   06/05/22 1830  ceFEPIme (MAXIPIME) 2 g in sodium chloride 0.9 % 100 mL IVPB  Status:  Discontinued        2 g 200 mL/hr over 30 Minutes Intravenous  Once 06/05/22 1820 06/05/22 1847   06/05/22 1830  metroNIDAZOLE (FLAGYL) IVPB 500 mg  Status:  Discontinued        500 mg 100 mL/hr over 60 Minutes Intravenous  Once 06/05/22 1820 06/05/22 1847   06/05/22 1830  vancomycin (VANCOCIN) IVPB 1000 mg/200 mL premix  Status:  Discontinued        1,000 mg 200 mL/hr over 60 Minutes Intravenous  Once 06/05/22 1820 06/05/22 1826   06/05/22 1830  vancomycin (VANCOREADY) IVPB 1500 mg/300 mL  Status:  Discontinued        1,500 mg 150 mL/hr over 120 Minutes Intravenous  Once 06/05/22 1826 06/05/22 1847         Objective: Vitals:   06/13/22 1400 06/13/22 1500 06/13/22 1600 06/13/22 1744  BP: 129/77 126/83 132/75 113/77  Pulse: 96 97 (!) 102 (!) 104  Resp: 16 17 (!) 21 16  Temp:    98.3 F (36.8 C)  TempSrc:    Oral  SpO2: 99% 99% 99% 100%  Weight:      Height:        Intake/Output Summary (Last 24 hours) at 06/13/2022 1846 Last data filed at 06/13/2022 1410 Gross per 24 hour  Intake 1176 ml  Output 1200 ml  Net -24 ml   Filed Weights   06/11/22 0500 06/12/22 0437 06/13/22 0636  Weight: 67.2 kg 66.2 kg 67.8 kg    Examination:  General exam: weak, but alert, not oriented to time but to person and place Respiratory system: Clear to auscultation. Respiratory effort normal. Cardiovascular system:  RRR.  Gastrointestinal system: Abdomen is nondistended, soft and nontender.  Normal bowel sounds heard. Central nervous system: Alert and orientedx2. No focal neurological deficits. Extremities:  no edema Skin: No rashes, lesions or ulcers Psychiatry: Judgement and insight appear normal. Mood & affect appropriate.     Data Reviewed: I have  personally reviewed  labs and visualized  imaging studies since the last encounter and formulate the plan        Scheduled Meds:  acetaminophen (TYLENOL) oral liquid 160 mg/5 mL  1,000 mg Oral TID   aspirin  81 mg Per Tube Daily   atorvastatin  20 mg Oral Daily   calcium carbonate  1 tablet Oral BID WC   Chlorhexidine Gluconate Cloth  6 each Topical Daily   cholestyramine light  4 g Per Tube BID   clopidogrel  75 mg Per Tube Daily   feeding supplement  237 mL Oral TID BM   feeding supplement (PROSource TF20)  60 mL Per Tube Daily   losartan  50 mg Oral Daily   metoprolol tartrate  25 mg Oral BID   mirtazapine  7.5 mg Oral QHS   nystatin  5 mL Oral QID   sucralfate  1 g Oral TID WC & HS   Continuous Infusions:  sodium chloride 10 mL/hr at 06/07/22 0645   cefTRIAXone (ROCEPHIN)  IV Stopped (06/13/22 1218)   feeding supplement (OSMOLITE 1.5 CAL) 60 mL/hr at 06/13/22 1317     LOS: 8 days   Time spent:  20mns  FFlorencia Reasons MD PhD FACP [facility]  Available via Epic secure chat 7am-7pm for nonurgent issues Please page for urgent issues To page the attending provider between 7A-7P or the covering provider during after hours 7P-7A, please log into the web site [email or web address] and access using universal [place] password for that web site. If you do not have the password, please call the hospital operator.    06/13/2022, 6:46 PM

## 2022-06-13 NOTE — Progress Notes (Signed)
Speech Language Pathology Treatment: Dysphagia  Patient Details Name: Jesse [name] MRN: [id number] DOB: [birth year] Today's Date: 06/13/2022 Time: [id number] SLP Time Calculation (min) (ACUTE ONLY): 14 min  Assessment / Plan / Recommendation Clinical Impression  Pt is using extra caution with swallowing due to his odynophagia, which bodes well for dysphagia management. His po intake is poor per RN and he is on Carafate as well as lidocaine to help with radiation esophagitis. Recommend continue diet encouraging po intake - hopeful for impoved intake once radiation esophagitis abates - but pt admitted to poor appetite even prior to admit. Note palliative care is following pt. RN reports pt to transfer to the floor thus created two swallow precaution signs to be sent with the pt.   Pt continues with white coating on tongue and is being treated for oral candidiasis.  RN reports he is lightly resistant to dental brushing.     HPI HPI: 72 yo male adm to [facility] with weakness and lethargy since saturday as well as poor PO intake. Pt has PMH + for Lung cancer on the left side, reflux esophagitis, GERD, Klebsiella pneumonia, GIST.  He had some increased RR and tachdcardic but now on 6 liters [place].  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  MRI brain 06/11/2022 Scattered small acute infarcts in the supratentorial and infratentorial brain, There are small foci of diffusion restriction in the left frontal cortex , right occipital and bilateral cerebellar hemispheres Moderate degenerative spondylosis noted at C3-4 through C6-7. CXR Parenchymal opacities within the partially visualized right lung, suspicious for pneumonia.   Pt is s/p palliative meeting and per notes he has DNR paper work at home.  Wife reported to palliative that she wishes to speak to oncologist re: pt's care plan.      SLP Plan  Continue with current plan of  care  Patient needs continued Speech Lanaguage Pathology Services   Recommendations for follow up therapy are one component of a multi-disciplinary discharge planning process, led by the attending physician.  Recommendations may be updated based on patient status, additional functional criteria and insurance authorization.    Recommendations  Diet recommendations: Dysphagia 3 (mechanical soft);Thin liquid (room temp and spicy, carbonated or acidic items) Liquids provided via: Cup;Straw;Teaspoon Medication Administration: Whole meds with liquid (or via tube) Compensations: Slow rate;Small sips/bites;Other (Comment) Postural Changes and/or Swallow Maneuvers: Seated upright 90 degrees;Upright 30-60 min after meal                Oral Care Recommendations: Oral care QID Follow Up Recommendations: Other (comment) SLP Visit Diagnosis: Dysphagia, pharyngeal phase (R13.13);Dysphagia, unspecified (R13.10) Plan: Continue with current plan of care          Kathleen Lime, MS [place] Office [phone]  [name]  06/13/2022, 4:48 PM

## 2022-06-13 NOTE — Progress Notes (Signed)
Nutrition Follow-up  DOCUMENTATION CODES:   Severe malnutrition in context of chronic illness  INTERVENTION:  - DYS 3 diet.  -Ensure Plus High Protein po TID, each supplement provides 350 kcal and 20 grams of protein.  - Pt's wife also brining in Premier [employer] from home, provides 160 kcals and 30g protein per bottle.   Continue TF via NGT as patient not yet showing adequate oral intake: Osmolite 1.5 @ 22m/hr 60 ml Prosource TF daily -Tube feeding regimen provides 2240 kcals, 110 grams of protein, and 1097 ml of H2O.     - Monitor magnesium, potassium, and phosphorus BID for at least 3 days, MD to replete as needed, as pt is at risk for refeeding syndrome.  - Monitor weight trends.    NUTRITION DIAGNOSIS:   Severe Malnutrition related to chronic illness (lung cancer currently undergoing chemotherapy and radiation therapy) as evidenced by severe fat depletion, severe muscle depletion, percent weight loss (15% in 6 months). *ongoing  GOAL:   Patient will meet greater than or equal to 90% of their needs *unmet via oral nutrition, met with TF  MONITOR:   PO intake, Diet advancement, Labs, Weight trends, TF tolerance  REASON FOR ASSESSMENT:   Consult Assessment of nutrition requirement/status, Calorie Count  ASSESSMENT:   72y.o. male with medical history significant of lung cancer currently undergoing chemotherapy and radiation therapy, HTN, HLD, history of bladder cancer, GERD, hypokalemia, recurrent anemia, COPD, macular degeneration who presented with severe fatigue and confusion.  2/20 -admitted 2/22- NGT removed by pt, TF held 2/2 vomiting, NGT replaced (10 french small bore feeding tube- weighted feeding catheter tip in distal stomach per KUB) 2/23- s/po BSE- advanced to dysphagia 3 diet with thin liquids 2/24- TF started 2/26- Calorie count started  Minimal documentation on calorie count. Results as below with information available.   Day 1 (2/26):  Nothing  documented for any meals. Patient received a breakfast and a lunch tray but unable to determine if patient ate anything due to insubstantial data at this time.  Supplements: 2 Ensure Plus High Protein Total intake (all from supplements): 700 kcal (36% of minimum estimated needs)  40 protein (36% of minimum estimated needs)  Day 2 (2/27): Nothing documented for any meals. Patient did not receive a breakfast or dinner tray. Only ordered oatmeal for lunch but unable to determine if patient ate anything insubstantial data. Supplements: 3 Ensure Plus High Protein Total intake (all from supplements): 1050kcal (56% of minimum estimated needs)  60 protein (55% of minimum estimated needs)   Met with patient at bedside who was drinking juice at time of visit.  Patient reports his intake "depends on the day" as his appetite has been poor. Drinking liquids but not eating as much food. Could not remember what he had to eat yesterday but confirmed he drank 3 Ensure.  Patient notes his wife has been bringing in [employer]which he likes for variety from the Ensure.  Reports his tube feeds are going well, denied any issues.   Stressed importance of ordering and trying to eat 3 meals a day. Encouraged intake of supplements at least 3 times a day for more calorie/protein dense oral intake options.  Patient endorsed understanding.    Calorie count results discussed with MD. Plan to continue goal TF to meet 100% of estimated needs at this time as patient has not yet proven adequate intake.    Medications reviewed and include: Imodium, Remeron, Carafate  Labs reviewed:  HA1C 6.2  Blood Glucose 104-151 x24 hours   Diet Order:   Diet Order             DIET DYS 3 Room service appropriate? Yes; Fluid consistency: Thin  Diet effective now                   EDUCATION NEEDS:  Education needs have been addressed  Skin:  Skin Assessment: Reviewed RN Assessment  Last BM:  2/27  Height:  Ht  Readings from Last 1 Encounters:  06/06/22 '5\' 9"'$  (1.753 m)   Weight:  Wt Readings from Last 1 Encounters:  06/13/22 67.8 kg    BMI:  Body mass index is 22.07 kg/m.  Estimated Nutritional Needs:  Kcal:  1950-2300 kcals Protein:  110-130 grams Fluid:  >/= 1.9L    Samson Frederic RD, LDN For contact information, refer to [facility].

## 2022-06-13 NOTE — Progress Notes (Signed)
Report given. Pt stable at time of transfer

## 2022-06-13 NOTE — Evaluation (Addendum)
Speech Language Pathology Evaluation Patient Details Name: Jesse [name] MRN: [id number] DOB: 02-Apr-1951 Today's Date: 06/13/2022 Time: 1530-1600 SLP Time Calculation (min) (ACUTE ONLY): 30 min  Problem List:  Patient Active Problem List   Diagnosis Date Noted   Drug-induced neutropenia ([place]) 06/07/2022   Protein-calorie malnutrition, severe 06/07/2022   Bacteremia due to Klebsiella pneumoniae 06/06/2022   Pneumonia of right middle lobe due to Klebsiella pneumoniae ([place]) 06/06/2022   Severe sepsis ([place]) 06/05/2022   Hypokalemia 06/05/2022   AKI (acute kidney injury) ([place]) 06/05/2022   Hyponatremia 0000000   Acute metabolic encephalopathy 0000000   Acute respiratory failure with hypoxia ([place]) 06/05/2022   CAP (community acquired pneumonia) 06/05/2022   Thrombocytopenia ([place]) 06/05/2022   Sepsis ([place]) 06/05/2022   Diarrhea 06/05/2022   Weight loss 05/28/2022   Malignant neoplasm of lung ([place]) 04/30/2022   Adenopathy 04/06/2022   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/21/2021   Encounter for antineoplastic chemotherapy 01/28/2020   Adenocarcinoma of left lung, stage 2 ([place]) 12/31/2019   Goals of care, counseling/discussion 12/31/2019   S/P lobectomy of lung 12/16/2019   Lung nodule 10/13/2019   Essential hypertension 09/28/2016   Coronary artery calcification 09/28/2016   Impaired glucose tolerance 09/28/2016   NEOPLASM, MALIGNANT, BLADDER, HX OF 07/18/2007   Vitamin D deficiency 06/03/2007   DIVERTICULOSIS, COLON 01/14/2007   Osteoarthritis 01/14/2007   Dyslipidemia 01/09/2007   GERD 01/09/2007   REFLUX ESOPHAGITIS 06/01/2004   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   Past Medical History:  Past Medical History:  Diagnosis Date   Anemia    Cancer ([place])    bladder cancer   COPD (chronic obstructive pulmonary disease) ([place])    mild    DIVERTICULOSIS, COLON 01/14/2007   Qualifier: Diagnosis of  By: Paulina Fusi RN, [name]    GASTRITIS, CHRONIC  06/01/2004   Qualifier: Diagnosis of  By: Nolon Rod CMA ([place]), Robin     GERD 01/09/2007   Qualifier: Diagnosis of  By: Sherren Mocha, RN, Ellen     Heart murmur    as a child; no murmur heard 12/14/19   HYPERLIPIDEMIA 01/09/2007   Qualifier: Diagnosis of  By: Sherren Mocha RN, Dorian Pod     Hypertension    Lung mass    left upper nodule   Macular degeneration    right eye   NEOPLASM, MALIGNANT, BLADDER, HX OF 2002   Qualifier: Diagnosis of  By: Nolon Rod CMA (AAMA), Robin  / no chemo or radiation   OSTEOARTHRITIS 01/14/2007   Qualifier: Diagnosis of  By: Paulina Fusi, RN, [name] - knees   Rectal mass    Reflux esophagitis 06/01/2004   Qualifier: Diagnosis of  By: Nolon Rod CMA (AAMA), Robin     TOBACCO USER 02/16/2009   Qualifier: Diagnosis of  By: Burnice Logan  MD, Doretha Sou    VITAMIN D DEFICIENCY 06/03/2007   Qualifier: Diagnosis of  By: Burnice Logan  MD, Doretha Sou    Past Surgical History:  Past Surgical History:  Procedure Laterality Date   BREAST BIOPSY Left 03/16/2022   [place] LT RADIOACTIVE SEED LOC 03/16/2022 [facility]   BRONCHIAL BIOPSY  10/13/2019   Procedure: BRONCHIAL BIOPSIES;  Surgeon: Garner Nash, DO;  Location: [facility];  Service: Pulmonary;;   BRONCHIAL BRUSHINGS  10/13/2019   Procedure: BRONCHIAL BRUSHINGS;  Surgeon: Garner Nash, DO;  Location: [facility];  Service: Pulmonary;;   BRONCHIAL NEEDLE ASPIRATION BIOPSY  10/13/2019   Procedure: BRONCHIAL NEEDLE ASPIRATION BIOPSIES;  Surgeon: Garner Nash, DO;  Location: [facility];  Service: Pulmonary;;   BRONCHIAL NEEDLE ASPIRATION BIOPSY  04/11/2022   Procedure: BRONCHIAL NEEDLE ASPIRATION BIOPSIES;  Surgeon: Garner Nash, DO;  Location: [facility];  Service: Cardiopulmonary;;   BRONCHIAL WASHINGS  10/13/2019   Procedure: BRONCHIAL WASHINGS;  Surgeon: Garner Nash, DO;  Location: [facility];  Service: Pulmonary;;   COLONOSCOPY     multiple   DIVERTING ILEOSTOMY N/A 12/14/2021   Procedure: DIVERTING  ILEOSTOMY;  Surgeon: Michael Boston, MD;  Location: [facility];  Service: General;  Laterality: N/A;   ENDOBRONCHIAL ULTRASOUND Bilateral 04/11/2022   Procedure: ENDOBRONCHIAL ULTRASOUND;  Surgeon: Garner Nash, DO;  Location: [facility];  Service: Cardiopulmonary;  Laterality: Bilateral;   ILEOSTOMY CLOSURE N/A 03/22/2022   Procedure: OPEN TAKEDOWN OF LOOP ILEOSTOMY;  Surgeon: Michael Boston, MD;  Location: [facility];  Service: General;  Laterality: N/A;  GEN w/ERAS PATHWAY LOCAL   INTERCOSTAL NERVE BLOCK Left 12/16/2019   Procedure: INTERCOSTAL NERVE BLOCK;  Surgeon: Lajuana Matte, MD;  Location: [facility];  Service: Thoracic;  Laterality: Left;   KNEE SURGERY  07/20/2008   bilat.   NASAL SEPTUM SURGERY  1995   NODE DISSECTION Left 12/16/2019   Procedure: NODE DISSECTION;  Surgeon: Lajuana Matte, MD;  Location: [facility];  Service: Thoracic;  Laterality: Left;   RADIOACTIVE SEED GUIDED AXILLARY SENTINEL LYMPH NODE Left 03/20/2022   Procedure: RADIOACTIVE SEED GUIDED LEFT AXILLARY SENTINEL LYMPH NODE BIOPSY;  Surgeon: Erroll Luna, MD;  Location: [facility];  Service: General;  Laterality: Left;   RECTAL EXAM UNDER ANESTHESIA N/A 03/22/2022   Procedure: ANORECTAL EXAMINATION UNDER ANESTHESIA;  Surgeon: Michael Boston, MD;  [facility];  Service: General;  Laterality: N/A;   TONSILLECTOMY     TRANSURETHRAL RESECTION OF BLADDER TUMOR  2002   UPPER GASTROINTESTINAL ENDOSCOPY     VIDEO BRONCHOSCOPY  04/11/2022   Procedure: VIDEO BRONCHOSCOPY;  Surgeon: Garner Nash, DO;  [facility];  Service: Cardiopulmonary;;   VIDEO BRONCHOSCOPY WITH ENDOBRONCHIAL NAVIGATION N/A 10/13/2019   Procedure: VIDEO BRONCHOSCOPY WITH ENDOBRONCHIAL NAVIGATION;  Surgeon: Garner Nash, DO;  Location: [facility];  Service: Pulmonary;  Laterality: N/A;   WISDOM TOOTH EXTRACTION     XI ROBOTIC ASSISTED LOWER ANTERIOR RESECTION N/A 12/14/2021   Procedure: XI ROBOTIC ASSISTED LOWER ANTERIOR ULTRA LOW  RECTOSIGMOID RESECTION, COLOANAL HAND SEWN ANASTOMOSIS, AND BILATERAL TAP BLOCK;  Surgeon: Michael Boston, MD;  Location[facility];  Service: General;  Laterality: N/A;   HPI:  72 yo male adm [facility] with weakness and lethargy since saturday as well as poor PO intake. Pt has PMH + for Lung cancer on the left side, reflux esophagitis, GERD, Klebsiella pneumonia, GIST.  He had some increased RR and tachdcardic but now on 6 liters [place].  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  MRI brain 06/11/2022 Scattered small acute infarcts in the supratentorial and infratentorial brain, There are small foci of diffusion restriction in the left frontal cortex , right occipital and bilateral cerebellar hemispheres Moderate degenerative spondylosis noted at C3-4 through C6-7. CXR Parenchymal opacities within the partially visualized right lung, suspicious for pneumonia.   Pt is s/p palliative meeting and per notes he has DNR paper work at home.  Wife reported to palliative that she wishes to speak to oncologist re: pt's care plan.   Assessment / Plan / Recommendation Clinical Impression  Cognitive linguistic evaluation initiated given  order due to pt's embolic.  Pt awake in room, having returned from XRT.  His performance on testing may have been exacerbated by his pain - but he is noted to have sevre cognitive linguistic deficits.   Pt is mildly dysphonic but his articulation and language skills are intact.   Portion of [place] Mental Status exam administred with pt being disoriented to place, date and situation, demonstrating poor sustained attention, compromised mental flexibility and problem solving. Pt did verbalize frustration at being unable to complete functional math questions - stating "This is easy, I don't know why I can't do it."  He did not answer math question correctly even with written cue, named only 4  animals in sixty seconds and answered 1/4 questions correctly from narrative.  Pt verbalized that he can't get OOB but then asked SLP to get him to the bathroom  - poor awareness to loose primo fit and wet bed.    Suspect his CVAs impacts function  as well encephalopathy causing his severe cognitive linguistic deficits.  SLP will follow up for education with wife, but given his guarded prognosis, uncertain to his ability to rehabiltate.    SLP Assessment  SLP Recommendation/Assessment: Patient needs continued Speech [place] [facility] Services SLP Visit Diagnosis: Cognitive communication deficit (R41.841)    Recommendations for follow up therapy are one component of a multi-disciplinary discharge planning process, led by the attending physician.  Recommendations may be updated based on patient status, additional functional criteria and insurance authorization.    Follow Up Recommendations  Other (comment)    Assistance Recommended at Discharge     Functional Status Assessment Patient has had a recent decline in their functional status and/or demonstrates limited ability to make significant improvements in function in a reasonable and predictable amount of time  Frequency and Duration min 1 x/week  1 week      SLP Evaluation Cognition  Overall Cognitive Status: Impaired/Different from baseline Arousal/Alertness: Awake/alert Orientation Level: Oriented to person;Disoriented to place;Disoriented to time;Disoriented to situation Year:  (said 1989 with verbal choice of 2) Month: December (pt said December with verbal choice of 2) Day of Week: Incorrect Attention: Focused;Sustained Focused Attention: Impaired Sustained Attention: Impaired Memory: Impaired Awareness: Impaired Awareness Impairment: Intellectual impairment Problem Solving: Impaired Problem Solving Impairment: Functional basic (does not look for call bell despite cues to locate) Safety/Judgment: Impaired Comments: pt  admits to issues having problem solving expressing frustration       Comprehension  Auditory Comprehension Overall Auditory Comprehension: Impaired Yes/No Questions: Not tested Commands: Impaired One Step Basic Commands: 75-100% accurate Conversation: Simple Visual Recognition/Discrimination Discrimination: Not tested Reading Comprehension Reading Status:  (pt able to read correct month on the wall)    Expression Expression Primary Mode of Expression: Verbal Verbal Expression Overall Verbal Expression: Appears within functional limits for tasks assessed Initiation: No impairment Repetition: No impairment Naming: No impairment Pragmatics: No impairment Written Expression Dominant Hand: Right Written Expression: Not tested (DNT)   Oral / Motor  Oral Motor/Sensory Function Overall Oral Motor/Sensory Function: Within functional limits Motor Speech Overall Motor Speech: Impaired Respiration: Impaired Resonance: Within functional limits Articulation: Within functional limitis Intelligibility: Intelligibility reduced Word: 75-100% accurate Phrase: 50-74% accurate Sentence: 50-74% accurate Conversation: Not tested Motor Planning: Not tested    Kathleen Lime, MS [place]         [name] 06/13/2022, 4:39 PM

## 2022-06-13 NOTE — Plan of Care (Signed)
  Problem: Education: Goal: Knowledge of General Education information will improve Description Including pain rating scale, medication(s)/side effects and non-pharmacologic comfort measures Outcome: Progressing   Problem: Health Behavior/Discharge Planning: Goal: Ability to manage health-related needs will improve Outcome: Progressing   

## 2022-06-13 NOTE — Progress Notes (Signed)
Staff message sent to monitor staff to arrange 2 week Zio XT monitor upon discharge.

## 2022-06-13 NOTE — Progress Notes (Unsigned)
Enrolled for [employer] to mail a ZIO XT long term holter monitor to the patients address on file.   Dr. Ross to read. 

## 2022-06-13 NOTE — Consult Note (Addendum)
Cardiology Consultation   Patient ID: Jesse [name] MRN: [id number]; DOB: [birth year]  Admit date: 06/05/2022 Date of Consult: 06/13/2022  PCP:  [name], MD   [place] Providers Cardiologist:  New to [facility] - Dr. Harrington Challenger   Patient Profile:   Jesse [name] is a 72 y.o. male with a hx of HTN, HLD, bladder CA and recurrent non-small cell lung CA s/p chemoradiation who is being seen 06/13/2022 for the evaluation of LV dysfunction at the request of Dr. Erlinda Hong.  History of Present Illness:   Jesse [name] is a 72 year old male with past medical history of HTN, HLD, bladder CA and recurrent non-small cell lung CA s/p chemoradiation.  Patient has been followed by Dr. Julien Nordmann of oncology service.  He was diagnosed with stage IIB left lung cancer in 2021.  He underwent left upper lobectomy with lymph node dissection in September 2021 by Dr. Kipp Brood.  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  He underwent surgical resection of remaining rectosigmoid stromal tumor with coloanal anastomosis, diverting loop ileostomy and wedge liver biopsy by Dr. Johney Maine on 12/14/2021.  He is currently on concurrent chemotherapy with weekly carboplatin and paclitaxel for lung cancer and imatinib for pelvic GIST tumor.  Repeat CT scan of the chest for restaging of lung cancer revealed increased density of the soft tissue in the left hilar region concerning for possible developing lymphadenopathy or disease recurrence.  Subsequent PET scan showed a new hypermetabolic left axillary, mediastinal and left hilar adenopathy compatible with recurrent metastatic lung cancer.  He underwent repeat video bronchoscopy with EBUS on 04/11/2022 and a biopsy of lymph node showed recurrent adenocarcinoma.  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  He has been started on Megace.   Patient was admitted to [facility] on 06/05/2022 with severe  fatigue, AMS and was unresponsive.  On arrival, he was febrile with temperature 102, tachycardic with heart rate 131, potassium 2.6, creatinine 1.85, platelet 80.  Flu, RSV and COVID were negative.  Given chronic diarrhea, C. difficile was tested, antigen positive, however negative toxin.  GI pathogen panel negative.  Chest x-ray showed patchy right infrahilar airspace disease concerning for pneumonia.  Patient was hypotensive with systolic blood pressure in the 80s.  He received IV fluid and was placed on antibiotic for severe sepsis secondary to right infrahilar pneumonia.  He did receive IV Lasix as well for mild volume overload on 2/22.  Infectious disease service was consulted.  Subsequent blood culture grew Klebsiella pneumonia.  AKI resolved with hydration.  MRI of brain ordered on 06/11/2022 to check for brain metastasis revealed scattered small acute infarct in the supratentorial and infratentorial brain, no evidence of intracranial metastatic disease.  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  Patient was started on aspirin and Plavix.  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  Neurology recommended echocardiogram which was performed on 06/12/2022 and this revealed EF 40 to 45%, normal RV, no significant valve issue, no evidence of valvular vegetation on TTE.  May consider TEE if clinically indicated.  Cardiology service  consulted for abnormal echocardiogram.  Talking with the patient, he adamantly denies any recent chest discomfort.   Past Medical History:  Diagnosis Date   Anemia    Cancer ([place])    bladder cancer   COPD (chronic  obstructive pulmonary disease) ([place])    mild    DIVERTICULOSIS, COLON 01/14/2007   Qualifier: Diagnosis of  By: Paulina Fusi RN, [name]    GASTRITIS, CHRONIC 06/01/2004   Qualifier: Diagnosis of  By: Nolon Rod CMA (AAMA), Robin     GERD 01/09/2007   Qualifier: Diagnosis of  By: Sherren Mocha, RN, Ellen     Heart murmur    as a child; no murmur heard 12/14/19   HYPERLIPIDEMIA 01/09/2007   Qualifier: Diagnosis of  By: Sherren Mocha RN, Dorian Pod     Hypertension    Lung mass    left upper nodule   Macular degeneration    right eye   NEOPLASM, MALIGNANT, BLADDER, HX OF 2002   Qualifier: Diagnosis of  By: Nolon Rod CMA (AAMA), Robin  / no chemo or radiation   OSTEOARTHRITIS 01/14/2007   Qualifier: Diagnosis of  By: Paulina Fusi, RN, [name] - knees   Rectal mass    Reflux esophagitis 06/01/2004   Qualifier: Diagnosis of  By: Nolon Rod CMA (AAMA), Robin     TOBACCO USER 02/16/2009   Qualifier: Diagnosis of  By: Burnice Logan  MD, Doretha Sou    VITAMIN D DEFICIENCY 06/03/2007   Qualifier: Diagnosis of  By: Burnice Logan  MD, Doretha Sou     Past Surgical History:  Procedure Laterality Date   BREAST BIOPSY Left 03/16/2022   [place] LT RADIOACTIVE SEED LOC 03/16/2022 [facility] MAMMOGRAPHY   BRONCHIAL BIOPSY  10/13/2019   Procedure: BRONCHIAL BIOPSIES;  Surgeon: Garner Nash, DO;  Location: [place] [facility];  Service: Pulmonary;;   BRONCHIAL BRUSHINGS  10/13/2019   Procedure: BRONCHIAL BRUSHINGS;  Surgeon: Garner Nash, DO;  Location: [place];  Service: Pulmonary;;   BRONCHIAL NEEDLE ASPIRATION BIOPSY  10/13/2019   Procedure: BRONCHIAL NEEDLE ASPIRATION BIOPSIES;  Surgeon: Garner Nash, DO;  Location: [place] [facility];  Service: Pulmonary;;   BRONCHIAL NEEDLE ASPIRATION BIOPSY  04/11/2022   Procedure: BRONCHIAL NEEDLE ASPIRATION BIOPSIES;  Surgeon: Garner Nash, DO;  Location: [facility];  Service: Cardiopulmonary;;   BRONCHIAL WASHINGS  10/13/2019   Procedure: BRONCHIAL WASHINGS;  Surgeon: Garner Nash, DO;  Location:  [place] [facility];  Service: Pulmonary;;   COLONOSCOPY     multiple   DIVERTING ILEOSTOMY N/A 12/14/2021   Procedure: DIVERTING ILEOSTOMY;  Surgeon: Michael Boston, MD;  Location: [facility];  Service: General;  Laterality: N/A;   ENDOBRONCHIAL ULTRASOUND Bilateral 04/11/2022   Procedure: ENDOBRONCHIAL ULTRASOUND;  Surgeon: Garner Nash, DO;  Location: [facility];  Service: Cardiopulmonary;  Laterality: Bilateral;   ILEOSTOMY CLOSURE N/A 03/22/2022   Procedure: OPEN TAKEDOWN OF LOOP ILEOSTOMY;  Surgeon: Michael Boston, MD;  Location: [facility];  Service: General;  Laterality: N/A;  GEN w/ERAS PATHWAY LOCAL   INTERCOSTAL NERVE BLOCK Left 12/16/2019   Procedure: INTERCOSTAL NERVE BLOCK;  Surgeon: Lajuana Matte, MD;  Location: [place];  Service: Thoracic;  Laterality: Left;   KNEE SURGERY  07/20/2008   bilat.   NASAL SEPTUM SURGERY  1995   NODE DISSECTION Left 12/16/2019   Procedure: NODE DISSECTION;  Surgeon: Lajuana Matte, MD;  Location: [place];  Service: Thoracic;  Laterality: Left;   RADIOACTIVE SEED GUIDED AXILLARY SENTINEL LYMPH NODE Left 03/20/2022   Procedure: RADIOACTIVE SEED GUIDED LEFT AXILLARY SENTINEL LYMPH  NODE BIOPSY;  Surgeon: Erroll Luna, MD;  Location: [place];  Service: General;  Laterality: Left;   RECTAL EXAM UNDER ANESTHESIA N/A 03/22/2022   Procedure: ANORECTAL EXAMINATION UNDER ANESTHESIA;  Surgeon: Michael Boston, MD;  Location: [facility];  Service: General;  Laterality: N/A;   TONSILLECTOMY     TRANSURETHRAL RESECTION OF BLADDER TUMOR  2002   UPPER GASTROINTESTINAL ENDOSCOPY     VIDEO BRONCHOSCOPY  04/11/2022   Procedure: VIDEO BRONCHOSCOPY;  Surgeon: Garner Nash, DO;  Location: [facility];  Service: Cardiopulmonary;;   VIDEO BRONCHOSCOPY WITH ENDOBRONCHIAL NAVIGATION N/A 10/13/2019   Procedure: VIDEO BRONCHOSCOPY WITH ENDOBRONCHIAL NAVIGATION;  Surgeon: Garner Nash, DO;  Location: [place];  Service: Pulmonary;  Laterality: N/A;   WISDOM TOOTH EXTRACTION      XI ROBOTIC ASSISTED LOWER ANTERIOR RESECTION N/A 12/14/2021   Procedure: XI ROBOTIC ASSISTED LOWER ANTERIOR ULTRA LOW RECTOSIGMOID RESECTION, COLOANAL HAND SEWN ANASTOMOSIS, AND BILATERAL TAP BLOCK;  Surgeon: Michael Boston, MD;  Location: [facility];  Service: General;  Laterality: N/A;     Home Medications:  Prior to Admission medications   Medication Sig Start Date End Date Taking? Authorizing Provider  acetaminophen (TYLENOL) 500 MG tablet Take 1,000 mg by mouth every 8 (eight) hours as needed for moderate pain.   Yes [provider]  amLODipine (NORVASC) 5 MG tablet Take 1 tablet (5 mg total) by mouth daily. 10/25/21  Yes [name], MD  atorvastatin (LIPITOR) 40 MG tablet Take 1/2 tablet (20 mg total) by mouth daily. 10/25/21  Yes [name], MD  Cholecalciferol (VITAMIN D3 PO) Take 2,000 Units by mouth daily.   Yes [provider]  cholestyramine Lucrezia Starch) 4 GM/DOSE powder Take 4 g by mouth 2 (two) times daily.   Yes [provider]  diphenoxylate-atropine (LOMOTIL) 2.5-0.025 MG tablet Take 2 tablets by mouth 4 (four) times daily as needed for diarrhea or loose stools.   Yes [provider]  ferrous sulfate 325 (65 FE) MG EC tablet Take 1 tablet (325 mg total) by mouth 2 (two) times daily. 03/26/22 03/26/23 Yes Michael Boston, MD  imatinib (GLEEVEC) 400 MG tablet Take 1 tablet (400 mg total) by mouth daily. Take with meals and large glass of water.Caution:Chemotherapy. Patient taking differently: Take 400 mg by mouth at bedtime. Take with meals and large glass of water.Caution:Chemotherapy. 03/19/22  Yes Curt Bears, MD  loperamide (IMODIUM A-D) 2 MG tablet Take 4 mg by mouth 4 (four) times daily as needed for diarrhea or loose stools.   Yes [provider]  megestrol (MEGACE ES) 625 MG/5ML suspension TAKE 5 MLS (625 MG TOTAL) BY MOUTH DAILY. 05/23/22  Yes Curt Bears, MD  mirtazapine (REMERON) 15 MG tablet Take 1 tablet (15 mg  total) by mouth at bedtime. 05/28/22  Yes Heilingoetter, Cassandra L, PA-C  Multiple Vitamins-Minerals (PRESERVISION AREDS 2) CAPS Take 1 capsule by mouth 2 (two) times daily.   Yes [provider]  omeprazole (PRILOSEC) 40 MG capsule Take 1 capsule (40 mg total) by mouth daily. 10/25/21  Yes [name], MD  ondansetron (ZOFRAN) 4 MG tablet Take 4 mg by mouth every 8 (eight) hours as needed for nausea or vomiting.   Yes [provider]  OVER THE COUNTER MEDICATION Take 3 tablets by mouth daily.  Balance of [employer]   Yes [provider]  OVER THE COUNTER MEDICATION Take 3 tablets by mouth daily. Balance of [employer]   Yes [provider]  polycarbophil (FIBERCON) 625 MG tablet Take 2,500 mg by mouth daily. Taking 4 tabs ( 625 mg each)  daily   Yes [provider]  prochlorperazine (COMPAZINE) 10 MG tablet Take 1 tablet (10 mg total) by mouth every 6 (six) hours as needed for nausea or vomiting. 04/30/22  Yes Curt Bears, MD  sucralfate (CARAFATE) 1 g tablet Take 1 tablet by mouth 4 times daily -  with meals and at bedtime. 05/28/22  Yes Heilingoetter, Cassandra L, PA-C  potassium chloride SA (KLOR-CON M) 20 MEQ tablet Take 1 tablet (20 mEq total) by mouth daily. 06/04/22   Heilingoetter, Cassandra L, PA-C    Inpatient Medications: Scheduled Meds:  acetaminophen (TYLENOL) oral liquid 160 mg/5 mL  1,000 mg Oral TID   aspirin  81 mg Per Tube Daily   atorvastatin  20 mg Oral Daily   Chlorhexidine Gluconate Cloth  6 each Topical Daily   cholestyramine light  4 g Per Tube BID   clopidogrel  75 mg Per Tube Daily   feeding supplement  237 mL Oral TID BM   feeding supplement (PROSource TF20)  60 mL Per Tube Daily   loperamide HCl  2 mg Per Tube BID   losartan  50 mg Oral Daily   metoprolol tartrate  25 mg Oral BID   mirtazapine  7.5 mg Oral QHS   nystatin  5 mL Oral QID   sucralfate  1 g Oral TID WC & HS   Continuous Infusions:   sodium chloride 10 mL/hr at 06/07/22 0645   cefTRIAXone (ROCEPHIN)  IV Stopped (06/12/22 1243)   feeding supplement (OSMOLITE 1.5 CAL) 1,000 mL (06/13/22 0440)   PRN Meds: sodium chloride, acetaminophen **OR** acetaminophen, HYDROcodone-acetaminophen, ipratropium-albuterol, lidocaine, mouth rinse  Allergies:   No Known Allergies  Social History:   Social History   Socioeconomic History   Marital status: Married    Spouse name: Not on file   Number of children: 3   Years of education: Not on file   Highest education level: Not on file  Occupational History    Comment: semi retired   Tobacco Use   Smoking status: Former    Packs/day: 0.50    Years: 40.00    Total pack years: 20.00    Types: Cigarettes    Quit date: 09/21/2019    Years since quitting: 2.7   Smokeless tobacco: Never  Vaping Use   Vaping Use: Never used  Substance and Sexual Activity   Alcohol use: Yes    Alcohol/week: 15.0 standard drinks of alcohol    Types: 15 Standard drinks or equivalent per week    Comment: wine/beer/martini   Drug use: Not Currently    Types: Marijuana    Comment: Smokes marijuana occasional, last use in 08/2019   Sexual activity: Not Currently  Other Topics Concern   Not on file  Social History Narrative   Married   3 children: 3 grandchildren; 3 great grandchildren   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   Enjoys yard work and [place] Strain: [place]  (07/18/2021)   Overall Financial Resource Strain (CARDIA)    Difficulty of Paying Living Expenses: Not hard at [place]: No [place] (06/06/2022)   Hunger Vital Sign    Worried About Running Out of Food in the Last Year: Never true    [place] in the Last Year: Never true  Transportation Needs:  No Transportation Needs (06/06/2022)   PRAPARE - Hydrologist (Medical): No    Lack of Transportation (Non-Medical): No   Physical Activity: Sufficiently Active (07/18/2021)   Exercise Vital Sign    Days of Exercise per Week: 7 days    Minutes of Exercise per Session: 60 min  Stress: No Stress Concern Present (07/18/2021)   [place]    Feeling of Stress : Not at all  Social Connections: Moderately Isolated (07/12/2020)   Social Connection and Isolation Panel [NHANES]    Frequency of Communication with Friends and Family: More than three times a week    Frequency of Social Gatherings with Friends and Family: More than three times a week    Attends Religious Services: Never    Marine scientist or Organizations: No    Attends Archivist Meetings: Never    Marital Status: Married  Human resources officer Violence: Not At Risk (06/06/2022)   Humiliation, Afraid, Rape, and Kick questionnaire    Fear of Current or Ex-Partner: No    Emotionally Abused: No    Physically Abused: No    Sexually Abused: No    Family History:    Family History  Problem Relation Age of Onset   Dementia Mother    Diabetes Father    Mental illness Father    Peripheral vascular disease Brother      ROS:  Please see the history of present illness.   All other ROS reviewed and negative.     Physical Exam/Data:   Vitals:   06/13/22 0400 06/13/22 0600 06/13/22 0636 06/13/22 0713  BP:  139/65    Pulse: (!) 107 93    Resp: (!) 28 (!) 21    Temp: 98.3 F (36.8 C)   (!) 97.5 F (36.4 C)  TempSrc: Oral   Oral  SpO2: 99% 98%    Weight:   67.8 kg   Height:        Intake/Output Summary (Last 24 hours) at 06/13/2022 0821 Last data filed at 06/12/2022 1743 Gross per 24 hour  Intake 350 ml  Output 950 ml  Net -600 ml      06/13/2022    6:36 AM 06/12/2022    4:37 AM 06/11/2022    9:56 AM  Last 3 Weights  Weight (lbs) 149 lb 7.6 oz 145 lb 15.1 oz 148 lb 2.4 oz  Weight (kg) 67.8 kg 66.2 kg 67.2 kg     Body mass index is 22.07 kg/m.  General: Frail,  thin, chronically ill  HEENT: normal, NG tube in place Neck: no JVD Vascular: No carotid bruits; Distal pulses 2+ bilaterally Cardiac:  normal S1, S2; RRR; no murmur  Lungs:  clear to auscultation bilaterally, no wheezing, rhonchi or rales  Abd: soft, nontender, no hepatomegaly  Ext: no edema Musculoskeletal:  No deformities, BUE and BLE strength normal and equal Skin: warm and dry  Neuro: Alert and oriented x 3, does not know the year, alert to family, president and location Psych: Flat  EKG:  The EKG was personally reviewed and demonstrates: Mild sinus tachycardia, no significant ST-T wave changes, heart rate 1 5 Telemetry:  Telemetry was personally reviewed and demonstrates: Normal sinus rhythm, no A-fib or atrial flutter, no significant ventricular ectopy  Relevant CV Studies:  Echo 06/12/2022  1. Left ventricular ejection fraction, by estimation, is 40 to 45%. The  left ventricle has mildly decreased function. The left  ventricle  demonstrates global hypokinesis. The left ventricular internal cavity size  was mildly dilated. Indeterminate  diastolic filling due to E-A fusion.   2. Right ventricular systolic function is normal. The right ventricular  size is normal. Tricuspid regurgitation signal is inadequate for assessing  PA pressure.   3. The mitral valve is grossly normal. No evidence of mitral valve  regurgitation.   4. The aortic valve is grossly normal. Aortic valve regurgitation is not  visualized. No aortic stenosis is present.   5. The inferior vena cava is normal in size with greater than 50%  respiratory variability, suggesting right atrial pressure of 3 mmHg.   Conclusion(s)/Recommendation(s): No evidence of valvular vegetations on  this transthoracic echocardiogram. Consider a transesophageal  echocardiogram to exclude infective endocarditis if clinically indicated.   Laboratory Data:  High Sensitivity Troponin:  No results for input(s): "TROPONINIHS" in the  last 720 hours.   Chemistry Recent Labs  Lab 06/08/22 2111 06/09/22 0325 06/10/22 0256 06/12/22 0330  NA  --  141 140 138  K  --  3.6 4.0 4.3  CL  --  111 113* 106  CO2  --  '23 24 25  '$ GLUCOSE  --  148* 152* 122*  BUN  --  37* 36* 29*  CREATININE  --  0.78 0.78 0.53*  CALCIUM  --  8.0* 7.5* 7.9*  MG 1.4* 2.9*  --  1.6*  GFRNONAA  --  >60 >60 >60  ANIONGAP  --  7 3* 7    Recent Labs  Lab 06/08/22 0537 06/09/22 0325 06/12/22 0330  PROT 5.0* 4.7* 5.5*  ALBUMIN 2.2* 2.0* 2.1*  AST 38 27 24  ALT 33 28 21  ALKPHOS 73 70 104  BILITOT 0.7 0.6 0.5   Lipids  Recent Labs  Lab 06/12/22 0330  CHOL 83  TRIG 160*  HDL 13*  LDLCALC 38  CHOLHDL 6.4    Hematology Recent Labs  Lab 06/10/22 0256 06/10/22 1812 06/11/22 0316 06/12/22 0330  WBC 4.6  --  3.9* 5.1  RBC 2.37*  --  2.78* 2.93*  HGB 7.4* 9.2* 8.5* 9.0*  HCT 22.6* 27.5* 26.2* 27.6*  MCV 95.4  --  94.2 94.2  MCH 31.2  --  30.6 30.7  MCHC 32.7  --  32.4 32.6  RDW 16.2*  --  15.7* 15.5  PLT 60*  --  76* 104*   Thyroid No results for input(s): "TSH", "FREET4" in the last 168 hours.  BNPNo results for input(s): "BNP", "PROBNP" in the last 168 hours.  DDimer No results for input(s): "DDIMER" in the last 168 hours.   Radiology/Studies:  VAS [place] LOWER EXTREMITY VENOUS (DVT)  Result Date: 06/12/2022  Lower Venous DVT Study Patient Name:  Jesse [name]  Date of Exam:   06/12/2022 Medical Rec #: [id number]        Accession #:    [id number] Date of Birth: 04-14-1951        Patient Gender: M Patient Age:   44 years Exam Location:  [facility] Procedure:      VAS [place] LOWER EXTREMITY VENOUS (DVT) Referring Phys: Su Monks --------------------------------------------------------------------------------  Indications: Stroke.  Risk Factors: None identified. Comparison Study: No prior studies. Performing Technologist: Oliver Hum RVT  Examination Guidelines: A complete evaluation includes B-mode imaging, spectral  Doppler, color Doppler, and power Doppler as needed of all accessible portions of each vessel. Bilateral testing is considered an integral part of a complete examination. Limited examinations for reoccurring  indications may be performed as noted. The reflux portion of the exam is performed with the patient in reverse Trendelenburg.  +---------+---------------+---------+-----------+----------+--------------+ RIGHT    CompressibilityPhasicitySpontaneityPropertiesThrombus Aging +---------+---------------+---------+-----------+----------+--------------+ CFV      Full           Yes      Yes                                 +---------+---------------+---------+-----------+----------+--------------+ SFJ      Full                                                        +---------+---------------+---------+-----------+----------+--------------+ FV Prox  Full                                                        +---------+---------------+---------+-----------+----------+--------------+ FV Mid   Full                                                        +---------+---------------+---------+-----------+----------+--------------+ FV DistalFull           Yes      Yes                                 +---------+---------------+---------+-----------+----------+--------------+ PFV      Full                                                        +---------+---------------+---------+-----------+----------+--------------+ POP      Full           Yes      Yes                                 +---------+---------------+---------+-----------+----------+--------------+ PTV      Full                                                        +---------+---------------+---------+-----------+----------+--------------+ PERO     Full                                                        +---------+---------------+---------+-----------+----------+--------------+    +---------+---------------+---------+-----------+----------+--------------+ LEFT     CompressibilityPhasicitySpontaneityPropertiesThrombus Aging +---------+---------------+---------+-----------+----------+--------------+ CFV      Full           Yes  Yes                                 +---------+---------------+---------+-----------+----------+--------------+ SFJ      Full                                                        +---------+---------------+---------+-----------+----------+--------------+ FV Prox  Full                                                        +---------+---------------+---------+-----------+----------+--------------+ FV Mid   Full                                                        +---------+---------------+---------+-----------+----------+--------------+ FV DistalFull                                                        +---------+---------------+---------+-----------+----------+--------------+ PFV      Full                                                        +---------+---------------+---------+-----------+----------+--------------+ POP      Full           Yes      Yes                                 +---------+---------------+---------+-----------+----------+--------------+ PTV      Full                                                        +---------+---------------+---------+-----------+----------+--------------+ PERO     Full                                                        +---------+---------------+---------+-----------+----------+--------------+     Summary: RIGHT: - There is no evidence of deep vein thrombosis in the lower extremity.  - No cystic structure found in the popliteal fossa.  LEFT: - There is no evidence of deep vein thrombosis in the lower extremity.  - No cystic structure found in the popliteal fossa.  *See table(s) above for measurements and observations. Electronically signed  by Deitra Mayo MD on 06/12/2022 at 7:34:33 PM.    Final  ECHOCARDIOGRAM COMPLETE  Result Date: 06/12/2022    ECHOCARDIOGRAM REPORT   Patient Name:   Jesse [name] Date of Exam: 06/12/2022 Medical Rec #:  [id number]       Height:       69.0 in Accession #:    [id number]      Weight:       145.9 lb Date of Birth:  [birth year]       BSA:          1.807 m Patient Age:    14 years        BP:           141/96 mmHg Patient Gender: M               HR:           106 bpm. Exam Location:  Inpatient Procedure: 2D Echo, Cardiac Doppler, Color Doppler and Intracardiac            Opacification Agent Indications:    Stroke  History:        Patient has no prior history of Echocardiogram examinations.                 Signs/Symptoms:Bacteremia; Risk Factors:Hypertension and                 Dyslipidemia. Lung cancer. Chemo.  Sonographer:    Roseanna Rainbow RDCS Referring Phys: [id number] AMRIT ADHIKARI  Sonographer Comments: Technically difficult study due to poor echo windows, Technically challenging study due to limited acoustic windows, suboptimal parasternal window, suboptimal apical window and suboptimal subcostal window. Extremely difficult study. IMPRESSIONS  1. Left ventricular ejection fraction, by estimation, is 40 to 45%. The left ventricle has mildly decreased function. The left ventricle demonstrates global hypokinesis. The left ventricular internal cavity size was mildly dilated. Indeterminate diastolic filling due to E-A fusion.  2. Right ventricular systolic function is normal. The right ventricular size is normal. Tricuspid regurgitation signal is inadequate for assessing PA pressure.  3. The mitral valve is grossly normal. No evidence of mitral valve regurgitation.  4. The aortic valve is grossly normal. Aortic valve regurgitation is not visualized. No aortic stenosis is present.  5. The inferior vena cava is normal in size with greater than 50% respiratory variability, suggesting right atrial pressure of 3 mmHg.  Conclusion(s)/Recommendation(s): No evidence of valvular vegetations on this transthoracic echocardiogram. Consider a transesophageal echocardiogram to exclude infective endocarditis if clinically indicated. FINDINGS  Left Ventricle: No left ventricular thrombus is seen (Definity contrast used). Left ventricular ejection fraction, by estimation, is 40 to 45%. The left ventricle has mildly decreased function. The left ventricle demonstrates global hypokinesis. Definity contrast agent was given IV to delineate the left ventricular endocardial borders. The left ventricular internal cavity size was mildly dilated. There is no left ventricular hypertrophy. Indeterminate diastolic filling due to E-A fusion. Right Ventricle: The right ventricular size is normal. No increase in right ventricular wall thickness. Right ventricular systolic function is normal. Tricuspid regurgitation signal is inadequate for assessing PA pressure. Left Atrium: Left atrial size was normal in size. Right Atrium: Right atrial size was normal in size. Pericardium: There is no evidence of pericardial effusion. Mitral Valve: The mitral valve is grossly normal. Mild mitral annular calcification. No evidence of mitral valve regurgitation. Tricuspid Valve: The tricuspid valve is normal in structure. Tricuspid valve regurgitation is not demonstrated. Aortic Valve: The aortic valve is grossly normal. Aortic valve regurgitation is not visualized. No aortic stenosis is present. Pulmonic  Valve: The pulmonic valve was not well visualized. Aorta: The aortic root is normal in size and structure. Venous: The inferior vena cava is normal in size with greater than 50% respiratory variability, suggesting right atrial pressure of 3 mmHg. IAS/Shunts: The interatrial septum was not well visualized.  LEFT VENTRICLE PLAX 2D LVIDd:         5.70 cm      Diastology LVIDs:         4.50 cm      LV e' medial:    5.66 cm/s LV PW:         1.00 cm      LV E/e' medial:  9.5 LV  IVS:        1.10 cm      LV e' lateral:   10.00 cm/s LVOT diam:     2.50 cm      LV E/e' lateral: 5.4 LV SV:         54 LV SV Index:   30 LVOT Area:     4.91 cm  LV Volumes (MOD) LV vol d, MOD A2C: 99.6 ml LV vol d, MOD A4C: 122.0 ml LV vol s, MOD A2C: 59.5 ml LV vol s, MOD A4C: 70.9 ml LV SV MOD A2C:     40.1 ml LV SV MOD A4C:     122.0 ml LV SV MOD BP:      46.6 ml RIGHT VENTRICLE             IVC RV S prime:     12.00 cm/s  IVC diam: 1.80 cm TAPSE (M-mode): 2.1 cm LEFT ATRIUM             Index        RIGHT ATRIUM           Index LA diam:        3.10 cm 1.72 cm/m   RA Area:     10.70 cm LA Vol (A2C):   37.0 ml 20.48 ml/m  RA Volume:   21.20 ml  11.73 ml/m LA Vol (A4C):   33.6 ml 18.59 ml/m LA Biplane Vol: 36.7 ml 20.31 ml/m  AORTIC VALVE LVOT Vmax:   83.10 cm/s LVOT Vmean:  54.700 cm/s LVOT VTI:    0.109 m  AORTA Ao Root diam: 3.70 cm MITRAL VALVE MV Area (PHT): 6.32 cm    SHUNTS MV Decel Time: 120 msec    Systemic VTI:  0.11 m MV E velocity: 54.00 cm/s  Systemic Diam: 2.50 cm MV A velocity: 90.80 cm/s MV E/A ratio:  0.59 Mihai Croitoru MD Electronically signed by Sanda Klein MD Signature Date/Time: 06/12/2022/10:54:52 AM    Final    CT ANGIO HEAD NECK W WO CM  Result Date: 06/12/2022 CLINICAL DATA:  Follow-up examination for stroke. EXAM: CT ANGIOGRAPHY HEAD AND NECK TECHNIQUE: Multidetector CT imaging of the head and neck was performed using the standard protocol during bolus administration of intravenous contrast. Multiplanar CT image reconstructions and MIPs were obtained to evaluate the vascular anatomy. Carotid stenosis measurements (when applicable) are obtained utilizing NASCET criteria, using the distal internal carotid diameter as the denominator. RADIATION DOSE REDUCTION: This exam was performed according to the departmental dose-optimization program which includes automated exposure control, adjustment of the mA and/or kV according to patient size and/or use of iterative reconstruction  technique. CONTRAST:  79m OMNIPAQUE IOHEXOL 350 MG/ML SOLN COMPARISON:  Prior MRI from earlier the same day. FINDINGS: CT HEAD FINDINGS Brain: Age-related cerebral atrophy.  Previously identified small ischemic infarcts not visible by CT. No other acute large vessel territory infarct. No acute intracranial hemorrhage. No mass lesion or midline shift. No hydrocephalus or extra-axial fluid collection. Vascular: No abnormal hyperdense vessel. Scattered vascular calcifications noted within the carotid siphons. Skull: Scalp soft tissues and calvarium demonstrate no acute finding. Sinuses/Orbits: Globes orbital soft tissues within normal limits. Paranasal sinuses are clear. No mastoid effusion. Other: None. Review of the MIP images confirms the above findings CTA NECK FINDINGS Aortic arch: Visualized aortic arch within normal limits for caliber with standard branch pattern. Atheromatous change about the origin of the great vessels without hemodynamically significant stenosis. Right carotid system: Right common and internal carotid arteries are patent without dissection. Moderate atheromatous change about the right carotid bulb without hemodynamically significant greater than 50% stenosis. Left carotid system: Left common and internal carotid arteries are patent without dissection. Moderate atheromatous change about the left carotid bulb without hemodynamically significant greater than 50% stenosis. Vertebral arteries: Both vertebral arteries arise from the subclavian arteries. No proximal subclavian artery stenosis. Right vertebral artery dominant. Vertebral arteries patent without dissection or hemodynamically significant stenosis. Skeleton: No worrisome osseous lesions. Moderate degenerative spondylosis noted at C3-4 through C6-7. Other neck: No other acute soft tissue abnormality within the neck. Nasogastric tube in place. 12 mm left thyroid nodule, of doubtful significance given size and patient age, no follow-up  imaging recommended (ref: J Am Coll Radiol. 2015 Feb;12(2): 143-50). Upper chest: Emphysema noted. Parenchymal opacities within the visualized right upper and lower lobes, suspicious for pneumonia. Layering bilateral pleural effusions, right greater than left. Few small pulmonary nodules measuring up to 7 mm at the right lung apex, stable from prior. Review of the MIP images confirms the above findings CTA HEAD FINDINGS Anterior circulation: Both internal carotid arteries are patent to the termini without stenosis. Tiny 2 mm outpouching extending laterally from the proximal cavernous left ICA, suspicious for a small aneurysm (series 8, image 109). A1 segments patent bilaterally. Normal anterior communicating artery complex. Both anterior cerebral arteries widely patent without stenosis. No M1 stenosis or occlusion. No proximal MCA branch occlusion or high-grade stenosis. Distal MCA branches perfused and symmetric. Posterior circulation: Both V4 segments are widely patent. Both PICA patent. Basilar patent without stenosis superior cerebellar arteries patent bilaterally. Left PCA supplied via the basilar. Right PCA supplied via a hypoplastic right P1 segment and robust right posterior communicating artery. Both PCAs patent to their distal aspects without stenosis. Venous sinuses: Patent allowing for timing the contrast bolus. Anatomic variants: As above. Review of the MIP images confirms the above findings IMPRESSION: CT HEAD IMPRESSION: No acute intracranial abnormality. Previously identified small ischemic infarcts not visible by CT. CTA HEAD AND NECK IMPRESSION: 1. Negative CTA for large vessel occlusion or other emergent finding. 2. Moderate atheromatous change about the carotid bifurcations without hemodynamically significant greater than 50% stenosis. 3. 2 mm outpouching extending laterally from the proximal cavernous left ICA, suspicious for a small aneurysm. 4. Parenchymal opacities within the partially  visualized right lung, suspicious for pneumonia. 5. Layering bilateral pleural effusions, right greater than left. 6. Few small pulmonary nodules measuring up to 7 mm at the right lung apex, stable from prior. Continued surveillance recommended. Aortic Atherosclerosis (ICD10-I70.0) and Emphysema (ICD10-J43.9). Electronically Signed   By: Jeannine Boga M.D.   On: 06/12/2022 05:47   MR BRAIN W WO CONTRAST  Result Date: 06/11/2022 CLINICAL DATA:  History of lung cancer, presents with fatigue, confusion, unresponsiveness. EXAM: MRI HEAD WITHOUT  AND WITH CONTRAST TECHNIQUE: Multiplanar, multiecho pulse sequences of the brain and surrounding structures were obtained without and with intravenous contrast. CONTRAST:  6.63m GADAVIST GADOBUTROL 1 MMOL/ML IV SOLN COMPARISON:  CT head 06/07/2022, brain MRI 01/04/2020 FINDINGS: Brain: There are small foci of diffusion restriction in the left frontal cortex (5-37, 5-28) and white matter (5-34), right occipital cortex (5-19), and bilateral cerebellar hemispheres (5-12, 5-10). There is associated FLAIR signal abnormality involving the focus in the left centrum semiovale. There is no associated enhancement. Findings are most in keeping with small acute infarcts, possibly embolic. There is no hemorrhage or mass effect. There is no acute intracranial hemorrhage or extra-axial fluid collection Parenchymal volume is normal. The ventricles are normal in size. Parenchymal signal is otherwise normal, with no significant burden of underlying white matter microangiopathic change. There is no mass lesion or abnormal enhancement. There is no mass effect or midline shift. Vascular: Normal flow voids. Skull and upper cervical spine: Normal marrow signal. Sinuses/Orbits: The paranasal sinuses are clear. The globes and orbits are unremarkable. Other: There is subcentimeter probable sebaceous cyst in the left cheek. The pituitary and suprasellar region are normal. IMPRESSION: 1.  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 2. No evidence of intracranial metastatic disease. Electronically Signed   By: PValetta MoleM.D.   On: 06/11/2022 12:40   DG Abd 1 View  Result Date: 06/10/2022 CLINICAL DATA:  Nasogastric tube placement EXAM: ABDOMEN - 1 VIEW COMPARISON:  06/07/2022 FINDINGS: Nasoenteric feeding tube tip is seen overlying the expected distal body of the stomach. Normal abdominal gas pattern. No organomegaly. Pelvis excluded from view. Lumbar dextroscoliosis noted with superimposed degenerative change. IMPRESSION: 1. Nasoenteric feeding tube tip within the distal body of the stomach. Electronically Signed   By: AFidela SalisburyM.D.   On: 06/10/2022 22:37     Assessment and Plan:   LV dysfunction  -Received IV Lasix on 2/22, currently euvolemic.  EF 40 to 45% on echocardiogram 06/12/2022.  Currently on metoprolol tartrate 25 mg twice a day, will switch to metoprolol succinate 50 mg daily.  BP stable.  -Patient adamantly denies any recent chest discomfort.  He was admitted for sepsis.  No plan for ischemic workup.  Titrate GDMT as outpatient, repeat echocardiogram in 3 months.  Severe sepsis secondary to right Klebsiella pneumonia  -Hypotensive on arrival, blood pressure has since improved.  He was confused on arrival, he still has intermittent confusion, alert and oriented x 3 (family, president and location, cannot recall the year)  -Followed by infectious disease, on antibiotic.  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  This was felt to be embolic in nature.  CTA of the head and neck showed no large vessel occlusive disease.  Patient was outside of permissive hypertension window.  He was placed on aspirin and Plavix  -Although normally he may require TEE given combination of low EF,  bacteremia and embolic stroke, however patient is currently on tube feed due to thrush/dysphagia, he is likely not a good candidate for TEE at this time.  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 MEarlie Server  On chemo and radiation therapy.  Seen by palliative care service, remain full code.  AKI: Resolved  Thrombocytopenia: Improved.   Risk Assessment/Risk Scores:  For questions or updates, please contact [place] Please consult [email or web address] for contact info under    Signed, Almyra Deforest, [place]  06/13/2022 8:21 AM  Pt seen and examined   I agree with findings as noted by Janan Ridge above  Pt is a 72 yo with hx recurrent nonsmall cell lung CA, GIST tumor.  Admitted with sepsis.   Recived IV lasix at admit.   MRI showe small acue infarcts felt to be embolic.   Pt started on asa and Plavix Echo done  Showed LVEF 40 to 45%    I have reviewed images   Very poor give lung dz.    The inferior / inferoseptal wall appear more hypokinetic  The pt denies CP   Give his other medical issues and no symptoms I would favor controlling symptoms, managing fluid status as needed With CVA I would recomm an event monitor at d/c to evaluate for Afib    I would not pursue TEE as I do not think it would change his clinical course.  (I do not think he would be a candidate for invasive procedure)  The pt is on losartan and b blocker   Would switch to Toprol XL    On exam    Lungs are relatively clear Neck   JVP is normal Cardiac exam   RRR  No S3  No significant murmurs  Abd is supple  Ext are without edema   2+ pulses   Will continue to follow   Will make sure he has appt for follow up after d/c

## 2022-06-13 NOTE — Progress Notes (Signed)
Daily Progress Note   Patient Name: Jesse [name]       Date: 06/13/2022 DOB: April 29, 1950  Age: 72 y.o. MRN#: [id number] Attending Physician: Florencia Reasons, MD Primary Care Physician: [name], MD Admit Date: 06/05/2022  Reason for Consultation/Follow-up:   "lung cancer patient presented with with bacteremia.has very poor intake ,on tube feed,very deconditioned,weak.Quality of life is very poor.Needs goals of care"   Patient Profile/HPI: 72 y.o. 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 Further workup revealed multiple scattered embolic strokes- workup in progress to determine cause. He has had poor po intake and significant weight loss- cortrak tube feeding is in place.       Subjective: Awake, alert. Neck is stiff. Viscous lidocaine and carafate are helping with his mouth and throat pain.  Called spouse Jesse [name] for followup.  We discussed his difficulties tolerating his treatments. The combination of chemotherapy, radiation and his recent surgery have all contributed to his failure to thrive. Side effects of chemotherapy and radiation were reviewed.  His current quality of life is not what he would want to prolong if he was not going to eventually return to a better functional level.  She is hoping to discuss this further with Dr. Julien Nordmann.  He has advanced directives and Jesse [name] mentioned he has DNR in place. She  is going to bring paperwork to the hospital. He is currently full code status in the hospital.  We discussed his code status. Reviewed options for DNR- comfort care only, DNR- limited interventions, DNR- full scope care. Jesse [name] is going to consider these, no decision made today.    Review of Systems  Constitutional:  Positive for malaise/fatigue.  Neurological:  Positive for weakness.     Physical Exam Vitals and nursing note reviewed.  Constitutional:      Comments: frail  Pulmonary:     Effort: Pulmonary effort is normal.  Neurological:     Mental Status: He is alert.     Motor:  Weakness present.             Vital Signs: BP (!) 149/82 (BP Location: Left Arm)   Pulse (!) 115   Temp (!) 97.5 F (36.4 C) (Oral)   Resp 18   Ht '5\' 9"'$  (1.753 m)   Wt 67.8 kg   SpO2 100%   BMI 22.07 kg/m  SpO2: SpO2: 100 % O2 Device: O2 Device: Room Air O2 Flow Rate: O2 Flow Rate (L/min): 2 L/min  Intake/output summary:  Intake/Output Summary (Last 24 hours) at 06/13/2022 1015 Last data filed at 06/13/2022 [id number] Gross per 24 hour  Intake 1223 ml  Output 950 ml  Net 273 ml   LBM: Last BM Date : 06/12/22 Baseline Weight: Weight: 65.7 kg Most recent weight: Weight: 67.8 kg       Palliative Assessment/Data: PPS: 30%      Patient Active Problem List   Diagnosis Date Noted   Drug-induced neutropenia ([place]) 06/07/2022   Protein-calorie malnutrition, severe 06/07/2022   Bacteremia due to Klebsiella pneumoniae 06/06/2022   Pneumonia of right middle lobe due to Klebsiella pneumoniae ([place]) 06/06/2022   Severe sepsis ([place]) 06/05/2022   Hypokalemia 06/05/2022   AKI (acute kidney injury) ([place]) 06/05/2022   Hyponatremia 0000000   Acute metabolic encephalopathy 0000000   Acute respiratory failure with hypoxia ([place]) 06/05/2022   CAP (community acquired pneumonia) 06/05/2022   Thrombocytopenia ([place]) 06/05/2022   Sepsis ([place]) 06/05/2022   Diarrhea 06/05/2022   Weight loss 05/28/2022    Malignant neoplasm of lung ([place]) 04/30/2022   Adenopathy 04/06/2022   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   Encounter for antineoplastic chemotherapy 01/28/2020   Adenocarcinoma of left lung, stage 2 ([place]) 12/31/2019   Goals of care, counseling/discussion 12/31/2019   S/P lobectomy of lung 12/16/2019   Lung nodule 10/13/2019   Essential hypertension 09/28/2016   Coronary artery calcification 09/28/2016   Impaired glucose tolerance 09/28/2016   NEOPLASM, MALIGNANT, BLADDER, HX OF 07/18/2007   Vitamin D deficiency 06/03/2007   DIVERTICULOSIS, COLON 01/14/2007   Osteoarthritis 01/14/2007   Dyslipidemia 01/09/2007   GERD 01/09/2007   REFLUX ESOPHAGITIS 06/01/2004   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    Palliative Care Assessment & Plan    Assessment/Recommendations/Plan  Continue current plan of care Jesse [name] to bring Advanced Directives to be placed on chart Jesse [name] interested in speaking to Oncology regarding his current treatments and consideration of a pause in his treatments Continue symptom management interventions previously ordered for thrush and radiation esophagitis   Code Status: Full code  Prognosis:  Unable to determine  Discharge Planning: To Be Determined  Care plan was discussed with patient's spouse and care team.   Thank you for allowing the Palliative Medicine Team to assist in the care of this patient.  Total time: 80 minutes   Greater than 50%  of this time was spent counseling and coordinating care related to the above assessment and plan.  Mariana Kaufman, AGNP-C Palliative Medicine   Please contact Palliative Medicine Team phone at [phone] for questions and concerns.

## 2022-06-14 ENCOUNTER — Ambulatory Visit
Admission: RE | Admit: 2022-06-14 | Discharge: 2022-06-14 | Disposition: A | Discharge status: Home / Self Care | Payer: Medicare Other | Source: Ambulatory Visit | Attending: Radiation Oncology | Admitting: Radiation Oncology

## 2022-06-14 ENCOUNTER — Inpatient Hospital Stay (HOSPITAL_COMMUNITY): Payer: Medicare Other

## 2022-06-14 ENCOUNTER — Other Ambulatory Visit: Payer: Self-pay

## 2022-06-14 DIAGNOSIS — D701 Agranulocytosis secondary to cancer chemotherapy: Secondary | ICD-10-CM | POA: Diagnosis not present

## 2022-06-14 DIAGNOSIS — K208 Other esophagitis without bleeding: Secondary | ICD-10-CM | POA: Diagnosis not present

## 2022-06-14 DIAGNOSIS — R6520 Severe sepsis without septic shock: Secondary | ICD-10-CM | POA: Diagnosis not present

## 2022-06-14 DIAGNOSIS — B370 Candidal stomatitis: Secondary | ICD-10-CM | POA: Diagnosis not present

## 2022-06-14 DIAGNOSIS — A419 Sepsis, unspecified organism: Secondary | ICD-10-CM | POA: Diagnosis not present

## 2022-06-14 DIAGNOSIS — F17210 Nicotine dependence, cigarettes, uncomplicated: Secondary | ICD-10-CM | POA: Diagnosis not present

## 2022-06-14 DIAGNOSIS — C3412 Malignant neoplasm of upper lobe, left bronchus or lung: Secondary | ICD-10-CM | POA: Diagnosis not present

## 2022-06-14 DIAGNOSIS — C771 Secondary and unspecified malignant neoplasm of intrathoracic lymph nodes: Secondary | ICD-10-CM | POA: Diagnosis not present

## 2022-06-14 DIAGNOSIS — Z510 Encounter for antineoplastic radiation therapy: Secondary | ICD-10-CM | POA: Diagnosis not present

## 2022-06-14 LAB — GLUCOSE, CAPILLARY
Glucose-Capillary: 103 mg/dL — ABNORMAL HIGH (ref 70–99)
Glucose-Capillary: 111 mg/dL — ABNORMAL HIGH (ref 70–99)
Glucose-Capillary: 123 mg/dL — ABNORMAL HIGH (ref 70–99)
Glucose-Capillary: 126 mg/dL — ABNORMAL HIGH (ref 70–99)
Glucose-Capillary: 135 mg/dL — ABNORMAL HIGH (ref 70–99)
Glucose-Capillary: 145 mg/dL — ABNORMAL HIGH (ref 70–99)

## 2022-06-14 LAB — BASIC METABOLIC PANEL
Anion gap: 4 — ABNORMAL LOW (ref 5–15)
BUN: 28 mg/dL — ABNORMAL HIGH (ref 8–23)
CO2: 24 mmol/L (ref 22–32)
Calcium: 7.8 mg/dL — ABNORMAL LOW (ref 8.9–10.3)
Chloride: 105 mmol/L (ref 98–111)
Creatinine, Ser: 0.56 mg/dL — ABNORMAL LOW (ref 0.61–1.24)
GFR, Estimated: >60 mL/min (ref >60)
Glucose, Bld: 125 mg/dL — ABNORMAL HIGH (ref 70–99)
Potassium: 4.5 mmol/L (ref 3.5–5.1)
Sodium: 133 mmol/L — ABNORMAL LOW (ref 135–145)

## 2022-06-14 LAB — RAD ONC ARIA SESSION SUMMARY
Course Elapsed Days: 30
Plan Fractions Treated to Date: 21
Plan Prescribed Dose Per Fraction: 2 Gy
Plan Total Fractions Prescribed: 30
Plan Total Prescribed Dose: 60 Gy
Reference Point Dosage Given to Date: 42 Gy
Reference Point Session Dosage Given: 2 Gy
Session Number: 21

## 2022-06-14 LAB — PHOSPHORUS: Phosphorus: 3.4 mg/dL (ref 2.5–4.6)

## 2022-06-14 LAB — MAGNESIUM: Magnesium: 2 mg/dL (ref 1.7–2.4)

## 2022-06-14 MED ORDER — SODIUM CHLORIDE 0.9 % IV SOLN
2.0000 g | INTRAVENOUS | Status: AC
  Administered 2022-06-14: 2 g via INTRAVENOUS
  Filled 2022-06-14: qty 20

## 2022-06-14 NOTE — Progress Notes (Signed)
Rounding Note    Patient Name: Jesse [name] Date of Encounter: 06/14/2022   Subjective   Breathing stable/fair  No CP    Inpatient Medications    Scheduled Meds:  acetaminophen (TYLENOL) oral liquid 160 mg/5 mL  1,000 mg Oral TID   aspirin  81 mg Per Tube Daily   atorvastatin  20 mg Oral Daily   calcium carbonate  1 tablet Oral BID WC   Chlorhexidine Gluconate Cloth  6 each Topical Daily   cholestyramine light  4 g Per Tube BID   clopidogrel  75 mg Per Tube Daily   feeding supplement  237 mL Oral TID BM   feeding supplement (PROSource TF20)  60 mL Per Tube Daily   losartan  50 mg Oral Daily   metoprolol tartrate  25 mg Oral BID   mirtazapine  7.5 mg Oral QHS   nystatin  5 mL Oral QID   sucralfate  1 g Oral TID WC & HS   Continuous Infusions:  sodium chloride 10 mL/hr at 06/07/22 0645   cefTRIAXone (ROCEPHIN)  IV Stopped (06/13/22 1218)   feeding supplement (OSMOLITE 1.5 CAL) 1,000 mL (06/13/22 2200)   PRN Meds: sodium chloride, acetaminophen **OR** acetaminophen, HYDROcodone-acetaminophen, ipratropium-albuterol, lidocaine, mouth rinse   Vital Signs    Vitals:   06/13/22 2300 06/14/22 0024 06/14/22 0500 06/14/22 0531  BP:  93/70  (!) 122/92  Pulse:  (!) 110  (!) 104  Resp: '20 18  18  '$ Temp:  98.6 F (37 C)  98.2 F (36.8 C)  TempSrc:  Oral  Oral  SpO2:  99%  99%  Weight:   61.3 kg   Height:        Intake/Output Summary (Last 24 hours) at 06/14/2022 0815 Last data filed at 06/14/2022 [id number] Gross per 24 hour  Intake 1296 ml  Output 2300 ml  Net -1004 ml      06/14/2022    5:00 AM 06/13/2022    6:36 AM 06/12/2022    4:37 AM  Last 3 Weights  Weight (lbs) 135 lb 2.3 oz 149 lb 7.6 oz 145 lb 15.1 oz  Weight (kg) 61.3 kg 67.8 kg 66.2 kg      Telemetry     - Personally Reviewed  ECG     - Personally Reviewed  Physical Exam   GEN: Thin 71 yo in no acute distress.   Neck: JVP is normal  Cardiac: RRR, no murmurs  Respiratory: Clear to auscultation  bilaterally. GI: Soft, nontender, non-distended  MS: No edema; No deformity. Neuro:  Nonfocal  Psych: Normal affect   Labs    High Sensitivity Troponin:  No results for input(s): "TROPONINIHS" in the last 720 hours.   Chemistry Recent Labs  Lab 06/08/22 0537 06/08/22 1532 06/09/22 0325 06/10/22 0256 06/12/22 0330 06/14/22 0349  NA 137   < > 141 140 138 133*  K 2.4*   < > 3.6 4.0 4.3 4.5  CL 108   < > 111 113* 106 105  CO2 21*   < > '23 24 25 24  '$ GLUCOSE 112*   < > 148* 152* 122* 125*  BUN 44*   < > 37* 36* 29* 28*  CREATININE 0.96   < > 0.78 0.78 0.53* 0.56*  CALCIUM 7.6*   < > 8.0* 7.5* 7.9* 7.8*  MG 1.9   < > 2.9*  --  1.6* 2.0  PROT 5.0*  --  4.7*  --  5.5*  --  ALBUMIN 2.2*  --  2.0*  --  2.1*  --   AST 38  --  27  --  24  --   ALT 33  --  28  --  21  --   ALKPHOS 73  --  70  --  104  --   BILITOT 0.7  --  0.6  --  0.5  --   GFRNONAA >60   < > >60 >60 >60 >60  ANIONGAP 8   < > 7 3* 7 4*   < > = values in this interval not displayed.    Lipids  Recent Labs  Lab 06/12/22 0330  CHOL 83  TRIG 160*  HDL 13*  LDLCALC 38  CHOLHDL 6.4    Hematology Recent Labs  Lab 06/11/22 0316 06/12/22 0330 06/13/22 1902  WBC 3.9* 5.1 5.0  RBC 2.78* 2.93* 3.12*  HGB 8.5* 9.0* 9.5*  HCT 26.2* 27.6* 28.9*  MCV 94.2 94.2 92.6  MCH 30.6 30.7 30.4  MCHC 32.4 32.6 32.9  RDW 15.7* 15.5 14.9  PLT 76* 104* 152   Thyroid No results for input(s): "TSH", "FREET4" in the last 168 hours.  BNPNo results for input(s): "BNP", "PROBNP" in the last 168 hours.  DDimer No results for input(s): "DDIMER" in the last 168 hours.   Radiology    VAS [place] LOWER EXTREMITY VENOUS (DVT)  Result Date: 06/12/2022  Lower Venous DVT Study Patient Name:  Jesse [name]  Date of Exam:   06/12/2022 Medical Rec #: [id number]        Accession #:    [id number] Date of Birth: 08-16-1950        Patient Gender: M Patient Age:   32 years Exam Location:  [facility] Procedure:      VAS [place] LOWER EXTREMITY  VENOUS (DVT) Referring Phys: Su Monks --------------------------------------------------------------------------------  Indications: Stroke.  Risk Factors: None identified. Comparison Study: No prior studies. Performing Technologist: Oliver Hum RVT  Examination Guidelines: A complete evaluation includes B-mode imaging, spectral Doppler, color Doppler, and power Doppler as needed of all accessible portions of each vessel. Bilateral testing is considered an integral part of a complete examination. Limited examinations for reoccurring indications may be performed as noted. The reflux portion of the exam is performed with the patient in reverse Trendelenburg.  +---------+---------------+---------+-----------+----------+--------------+ RIGHT    CompressibilityPhasicitySpontaneityPropertiesThrombus Aging +---------+---------------+---------+-----------+----------+--------------+ CFV      Full           Yes      Yes                                 +---------+---------------+---------+-----------+----------+--------------+ SFJ      Full                                                        +---------+---------------+---------+-----------+----------+--------------+ FV Prox  Full                                                        +---------+---------------+---------+-----------+----------+--------------+ FV Mid   Full                                                        +---------+---------------+---------+-----------+----------+--------------+  FV DistalFull           Yes      Yes                                 +---------+---------------+---------+-----------+----------+--------------+ PFV      Full                                                        +---------+---------------+---------+-----------+----------+--------------+ POP      Full           Yes      Yes                                  +---------+---------------+---------+-----------+----------+--------------+ PTV      Full                                                        +---------+---------------+---------+-----------+----------+--------------+ PERO     Full                                                        +---------+---------------+---------+-----------+----------+--------------+   +---------+---------------+---------+-----------+----------+--------------+ LEFT     CompressibilityPhasicitySpontaneityPropertiesThrombus Aging +---------+---------------+---------+-----------+----------+--------------+ CFV      Full           Yes      Yes                                 +---------+---------------+---------+-----------+----------+--------------+ SFJ      Full                                                        +---------+---------------+---------+-----------+----------+--------------+ FV Prox  Full                                                        +---------+---------------+---------+-----------+----------+--------------+ FV Mid   Full                                                        +---------+---------------+---------+-----------+----------+--------------+ FV DistalFull                                                        +---------+---------------+---------+-----------+----------+--------------+  PFV      Full                                                        +---------+---------------+---------+-----------+----------+--------------+ POP      Full           Yes      Yes                                 +---------+---------------+---------+-----------+----------+--------------+ PTV      Full                                                        +---------+---------------+---------+-----------+----------+--------------+ PERO     Full                                                         +---------+---------------+---------+-----------+----------+--------------+     Summary: RIGHT: - There is no evidence of deep vein thrombosis in the lower extremity.  - No cystic structure found in the popliteal fossa.  LEFT: - There is no evidence of deep vein thrombosis in the lower extremity.  - No cystic structure found in the popliteal fossa.  *See table(s) above for measurements and observations. Electronically signed by Deitra Mayo MD on 06/12/2022 at 7:34:33 PM.    Final    ECHOCARDIOGRAM COMPLETE  Result Date: 06/12/2022    ECHOCARDIOGRAM REPORT   Patient Name:   JAVIONE MATHERS Date of Exam: 06/12/2022 Medical Rec #:  [id number]       Height:       69.0 in Accession #:    [id number]      Weight:       145.9 lb Date of Birth:  1950/05/09       BSA:          1.807 m Patient Age:    22 years        BP:           141/96 mmHg Patient Gender: M               HR:           106 bpm. Exam Location:  Inpatient Procedure: 2D Echo, Cardiac Doppler, Color Doppler and Intracardiac            Opacification Agent Indications:    Stroke  History:        Patient has no prior history of Echocardiogram examinations.                 Signs/Symptoms:Bacteremia; Risk Factors:Hypertension and                 Dyslipidemia. Lung cancer. Chemo.  Sonographer:    Roseanna Rainbow RDCS Referring Phys: [id number] AMRIT ADHIKARI  Sonographer Comments: Technically difficult study due to poor echo windows, Technically challenging study due to limited acoustic windows, suboptimal parasternal window, suboptimal apical window and suboptimal subcostal window.  Extremely difficult study. IMPRESSIONS  1. Left ventricular ejection fraction, by estimation, is 40 to 45%. The left ventricle has mildly decreased function. The left ventricle demonstrates global hypokinesis. The left ventricular internal cavity size was mildly dilated. Indeterminate diastolic filling due to E-A fusion.  2. Right ventricular systolic function is normal. The right  ventricular size is normal. Tricuspid regurgitation signal is inadequate for assessing PA pressure.  3. The mitral valve is grossly normal. No evidence of mitral valve regurgitation.  4. The aortic valve is grossly normal. Aortic valve regurgitation is not visualized. No aortic stenosis is present.  5. The inferior vena cava is normal in size with greater than 50% respiratory variability, suggesting right atrial pressure of 3 mmHg. Conclusion(s)/Recommendation(s): No evidence of valvular vegetations on this transthoracic echocardiogram. Consider a transesophageal echocardiogram to exclude infective endocarditis if clinically indicated. FINDINGS  Left Ventricle: No left ventricular thrombus is seen (Definity contrast used). Left ventricular ejection fraction, by estimation, is 40 to 45%. The left ventricle has mildly decreased function. The left ventricle demonstrates global hypokinesis. Definity contrast agent was given IV to delineate the left ventricular endocardial borders. The left ventricular internal cavity size was mildly dilated. There is no left ventricular hypertrophy. Indeterminate diastolic filling due to E-A fusion. Right Ventricle: The right ventricular size is normal. No increase in right ventricular wall thickness. Right ventricular systolic function is normal. Tricuspid regurgitation signal is inadequate for assessing PA pressure. Left Atrium: Left atrial size was normal in size. Right Atrium: Right atrial size was normal in size. Pericardium: There is no evidence of pericardial effusion. Mitral Valve: The mitral valve is grossly normal. Mild mitral annular calcification. No evidence of mitral valve regurgitation. Tricuspid Valve: The tricuspid valve is normal in structure. Tricuspid valve regurgitation is not demonstrated. Aortic Valve: The aortic valve is grossly normal. Aortic valve regurgitation is not visualized. No aortic stenosis is present. Pulmonic Valve: The pulmonic valve was not well  visualized. Aorta: The aortic root is normal in size and structure. Venous: The inferior vena cava is normal in size with greater than 50% respiratory variability, suggesting right atrial pressure of 3 mmHg. IAS/Shunts: The interatrial septum was not well visualized.  LEFT VENTRICLE PLAX 2D LVIDd:         5.70 cm      Diastology LVIDs:         4.50 cm      LV e' medial:    5.66 cm/s LV PW:         1.00 cm      LV E/e' medial:  9.5 LV IVS:        1.10 cm      LV e' lateral:   10.00 cm/s LVOT diam:     2.50 cm      LV E/e' lateral: 5.4 LV SV:         54 LV SV Index:   30 LVOT Area:     4.91 cm  LV Volumes (MOD) LV vol d, MOD A2C: 99.6 ml LV vol d, MOD A4C: 122.0 ml LV vol s, MOD A2C: 59.5 ml LV vol s, MOD A4C: 70.9 ml LV SV MOD A2C:     40.1 ml LV SV MOD A4C:     122.0 ml LV SV MOD BP:      46.6 ml RIGHT VENTRICLE             IVC RV S prime:     12.00 cm/s  IVC diam: 1.80 cm  TAPSE (M-mode): 2.1 cm LEFT ATRIUM             Index        RIGHT ATRIUM           Index LA diam:        3.10 cm 1.72 cm/m   RA Area:     10.70 cm LA Vol (A2C):   37.0 ml 20.48 ml/m  RA Volume:   21.20 ml  11.73 ml/m LA Vol (A4C):   33.6 ml 18.59 ml/m LA Biplane Vol: 36.7 ml 20.31 ml/m  AORTIC VALVE LVOT Vmax:   83.10 cm/s LVOT Vmean:  54.700 cm/s LVOT VTI:    0.109 m  AORTA Ao Root diam: 3.70 cm MITRAL VALVE MV Area (PHT): 6.32 cm    SHUNTS MV Decel Time: 120 msec    Systemic VTI:  0.11 m MV E velocity: 54.00 cm/s  Systemic Diam: 2.50 cm MV A velocity: 90.80 cm/s MV E/A ratio:  0.59 Mihai Croitoru MD Electronically signed by Sanda Klein MD Signature Date/Time: 06/12/2022/10:54:52 AM    Final     Cardiac Studies    Echo 06/12/2022  1. Left ventricular ejection fraction, by estimation, is 40 to 45%. The  left ventricle has mildly decreased function. The left ventricle  demonstrates global hypokinesis. The left ventricular internal cavity size  was mildly dilated. Indeterminate  diastolic filling due to E-A fusion.   2. Right  ventricular systolic function is normal. The right ventricular  size is normal. Tricuspid regurgitation signal is inadequate for assessing  PA pressure.   3. The mitral valve is grossly normal. No evidence of mitral valve  regurgitation.   4. The aortic valve is grossly normal. Aortic valve regurgitation is not  visualized. No aortic stenosis is present.   5. The inferior vena cava is normal in size with greater than 50%  respiratory variability, suggesting right atrial pressure of 3 mmHg.   Conclusion(s)/Recommendation(s): No evidence of valvular vegetations on  this transthoracic echocardiogram. Consider a transesophageal  echocardiogram to exclude infective endocarditis if clinically indicated.   Patient Profile      Jesse [name] is a 72 y.o. male with a hx of HTN, HLD, bladder CA and recurrent non-small cell lung CA s/p chemoradiation who is being seen 06/13/2022 for the evaluation of LV dysfunction at the request of Dr. Erlinda Hong.   Assessment & Plan    1HFrEF  Pt with echo showing LVEF 40 to 45%  on my review, hypokinesis worse in the inferior / inferoseptal walls     Pt without CP    Without active symptoms I would continue medical Rx  Pt is mildly tachycardic     JVP is not elevated   No edeam  Will check UA for specific gravity to further help in volume assessment    Keep on losartan and toprol   Watch BP    2  Hx CVA  Appeared embolic on MRI   Pt has been set up for a monitor after d/c     Keep on telemetry   3 LIpids LDL 38    For questions or updates, please contact [place] Please consult [email or web address] for contact info under        Signed, Dorris Carnes, MD  06/14/2022, 8:15 AM

## 2022-06-14 NOTE — Progress Notes (Signed)
Pt drank half bottle ensure.

## 2022-06-14 NOTE — Progress Notes (Signed)
PT Cancellation Note  Patient Details Name: Jesse [name] MRN: [id number] DOB: [birth year]   Cancelled Treatment:    Reason Eval/Treat Not Completed: Fatigue/lethargy limiting ability to participate Visitor in room reports pt just fell asleep and has been confused today.  Will check back as schedule permits.   Myrtis Hopping [place] 06/14/2022, 12:55 PM Jannette Spanner PT, DPT Physical Therapist [facility] Preferred contact method: Secure Chat Weekend Pager Only: [phone] Office: [phone]

## 2022-06-14 NOTE — Progress Notes (Signed)
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[place]) s/p resection /ileostomy then undergoing ileostomy takedown on 03/22/2022 , has been on imatinib   h/o recurrent Adenocarcinoma of left lung getting concurrent radiation and chemotherapy  Present with confusion, acute hypoxia, he was unresponsive initially on presentation, found to have sepsis ,  Klebsiella bacteremia and pneumonia, also found to have small acute embolic ischemic strokes  Subjective:  No acute event overnight,  oral intake remains very poor, continue to require nutrition per tube., + oral thrush Denies pain, no fever  Not oriented to time, oriented to place and person, not a reliable historian  Assessment & Plan:  Principal Problem:   Severe sepsis ([place]) Active Problems:   Dyslipidemia   Essential hypertension   Adenocarcinoma of left lung, stage 2 (HCC)   Malignant neoplasm of lung ([place])   Hypokalemia   AKI (acute kidney injury) ([place])   Hyponatremia   Acute metabolic encephalopathy   Acute respiratory failure with hypoxia ([place])   CAP (community acquired pneumonia)   Thrombocytopenia ([place])   Sepsis ([place])   Diarrhea   Bacteremia due to Klebsiella pneumoniae   Pneumonia of right middle lobe due to Klebsiella pneumoniae ([place])   Drug-induced neutropenia ([place])   Protein-calorie malnutrition, severe    Assessment and Plan:  Severe sepsis present on admission due to Klebsiella pneumonia bacteremia and pneumonia  per  Last ID note on 2/27, recommend total of 10 days treatment, day 10 of rocephin on 2/29    Acute CVAs -Incidental finding on MRI of the brain for cancer staging -Seen by neurology thought to be embolic, Patient was started on aspirin and Plavix. 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   Today he is alert, not oriented to time but to place and person    Pancytopenia: Given filgrastim x 3.  Oncology was following.  Hemoglobin in the range of 9, platelets in the range of 100.  No evidence of acute blood loss.  Status post a unit of PRBC transfusion on 2/25.  Currently hemoglobin stable   Acute on chronic diarrhea: History of anterior resection syndrome.  C. difficile positive but toxin was negative.  GI pathogen panel negative.  On  Questran, On contact precaution.  Diarrhea has stopped  AKI Resolved   hypertension: Initially hypotensive but now blood pressure has trended up.  Continue losartan, metoprolol    Dysphagia/anorexia/thrush: Likely associated with malignancy/chemo.  Speech therapy recommended dysphagia 3 diet.  Also started on slow tube feed.  On Remeron.  Goal is to stop tube feed after adequate oral intake.  Patient wants to remove it.  Dietitian following.  He is having very poor intake so tube will remain in place for now,also started on fluconazole for thrush   Recurrent adenocarcinoma of the lung -Initially diagnosed in June 2021, status post left  upper lobectomy with lymph node dissection on December 16, 2019  -repeat video bronchoscopy with EBUS on April 11, 2022 and biopsy from the station 7 lymph node showed recurrent adenocarcinoma.  -The patient is currently undergoing a course of concurrent chemoradiation with weekly carboplatin for AUC of 2 and  paclitaxel 45 Mg/M2. First dose May 14, 2022.  -Follows radiation oncology and medical oncology Dr. Julien Nordmann   GIST tumor of rectum -s/p Neoadjuvant treatment with imatinib 400 mg p.o. daily for the GIST tumor of the pelvic area. First dose started July 18, 2020. Status post 12 months of treatment. -s/p  robotic assisted very low anterior rectosigmoid resection with coloanal anastomosis, diverting loop ileostomy and wedge liver biopsy under the care of Dr. Johney Maine on December 14, 2021 and it showed minimal residual gastrointestinal stromal tumor.  -s/p undergoing ileostomy takedown on 03/22/2022  -home meds imatinib held F/u oncology Dr. Julien Nordmann   Deconditioning/weakness: PT/OT recommended SNF on discharge.  TOC following   Goals of care: Patient with history of lung cancer now with very poor oral intake, severe weakness.  Poor quality of life.  On tube feeding.  We requested palliative care consultation after discussion with wife,remains full code    Nutritional Assessment: The patient's BMI is: Body mass index is 19.96 kg/m.Marland Kitchen Seen by dietician.  I agree with the assessment and plan as outlined below: Nutrition Status: Nutrition Problem: Severe Malnutrition Etiology: chronic illness (lung cancer currently undergoing chemotherapy and radiation therapy) Signs/Symptoms: severe fat depletion, severe muscle depletion, percent weight loss (15% in 6 months) Percent weight loss: 15 % (in 6 months) Interventions: Ensure Enlive (each supplement provides 350kcal and 20 grams of protein), Tube feeding  .      I have Reviewed nursing notes, Vitals, pain scores, I/o's, Lab results and  imaging results since pt's last encounter, details please see discussion above  I ordered the following labs:  Unresulted Labs (From admission, onward)     Start     Ordered   06/18/22 0500  Phosphorus  Every Monday (0500),   R     Question:  Specimen collection method  Answer:  Lab=Lab collect   06/14/22 1118    06/15/22 [id number]  Basic metabolic panel  Daily,   R     Question:  Specimen collection method  Answer:  Lab=Lab collect   06/14/22 1118   06/15/22 0500  Magnesium  Every Mon-Wed-Fri (0500),   R     Question:  Specimen collection method  Answer:  Lab=Lab collect   06/14/22 1118   06/14/22 0905  Urinalysis, Routine w reflex microscopic -Urine, Clean Catch  Once,   R       Question:  Specimen Source  Answer:  Urine, Clean Catch   06/14/22 0905   06/13/22 1846  CBC  Every Mon-Wed-Fri,   R     Question:  Specimen collection method  Answer:  Lab=Lab collect   06/13/22 1846             DVT prophylaxis: SCDs Start: 06/06/22 0153   Code Status:   Code Status: Full Code  Family Communication: Patient Disposition:   Dispo: The patient is from: Home              Anticipated d/c is to: TBD              Anticipated d/c date is: Continue recording to previous  Antimicrobials:     Anti-infectives (From admission, onward)    Start  Dose/Rate Route Frequency Ordered Stop   06/14/22 1200  cefTRIAXone (ROCEPHIN) 2 g in sodium chloride 0.9 % 100 mL IVPB        2 g 200 mL/hr over 30 Minutes Intravenous Every 24 hours 06/14/22 1116 06/15/22 1214   06/12/22 1230  fluconazole (DIFLUCAN) IVPB 200 mg        200 mg 100 mL/hr over 60 Minutes Intravenous Every 24 hours 06/12/22 1134 06/12/22 1434   06/07/22 1200  vancomycin (VANCOCIN) IVPB 1000 mg/200 mL premix  Status:  Discontinued        1,000 mg 200 mL/hr over 60 Minutes Intravenous Every 36 hours 06/05/22 2323 06/06/22 1108   06/06/22 1200  cefTRIAXone (ROCEPHIN) 2 g in sodium chloride 0.9 % 100 mL IVPB  Status:  Discontinued        2 g 200 mL/hr over 30 Minutes Intravenous Every 24 hours 06/06/22 1110 06/14/22 1116   06/06/22 0200  ceFEPIme (MAXIPIME) 2 g in sodium chloride 0.9 % 100 mL IVPB  Status:  Discontinued        2 g 200 mL/hr over 30 Minutes Intravenous Every 12 hours 06/05/22 2323 06/06/22 1108   06/05/22 2315  vancomycin  (VANCOCIN) IVPB 1000 mg/200 mL premix        1,000 mg 200 mL/hr over 60 Minutes Intravenous  Once 06/05/22 2307 06/06/22 0145   06/05/22 1900  cefTRIAXone (ROCEPHIN) 2 g in sodium chloride 0.9 % 100 mL IVPB  Status:  Discontinued        2 g 200 mL/hr over 30 Minutes Intravenous Every 24 hours 06/05/22 1847 06/05/22 2323   06/05/22 1900  azithromycin (ZITHROMAX) 500 mg in sodium chloride 0.9 % 250 mL IVPB  Status:  Discontinued        500 mg 250 mL/hr over 60 Minutes Intravenous Every 24 hours 06/05/22 1847 06/06/22 1108   06/05/22 1830  ceFEPIme (MAXIPIME) 2 g in sodium chloride 0.9 % 100 mL IVPB  Status:  Discontinued        2 g 200 mL/hr over 30 Minutes Intravenous  Once 06/05/22 1820 06/05/22 1847   06/05/22 1830  metroNIDAZOLE (FLAGYL) IVPB 500 mg  Status:  Discontinued        500 mg 100 mL/hr over 60 Minutes Intravenous  Once 06/05/22 1820 06/05/22 1847   06/05/22 1830  vancomycin (VANCOCIN) IVPB 1000 mg/200 mL premix  Status:  Discontinued        1,000 mg 200 mL/hr over 60 Minutes Intravenous  Once 06/05/22 1820 06/05/22 1826   06/05/22 1830  vancomycin (VANCOREADY) IVPB 1500 mg/300 mL  Status:  Discontinued        1,500 mg 150 mL/hr over 120 Minutes Intravenous  Once 06/05/22 1826 06/05/22 1847         Objective: Vitals:   06/13/22 2300 06/14/22 0024 06/14/22 0500 06/14/22 0531  BP:  93/70  (!) 122/92  Pulse:  (!) 110  (!) 104  Resp: '20 18  18  '$ Temp:  98.6 F (37 C)  98.2 F (36.8 C)  TempSrc:  Oral  Oral  SpO2:  99%  99%  Weight:   61.3 kg   Height:        Intake/Output Summary (Last 24 hours) at 06/14/2022 1118 Last data filed at 06/14/2022 [id number] Gross per 24 hour  Intake 273 ml  Output 1550 ml  Net -1277 ml   Filed Weights   06/12/22 0437 06/13/22 0636 06/14/22 0500  Weight: 66.2 kg 67.8 kg 61.3  kg    Examination:  General exam: weak, but alert, not oriented to time but to person and place Respiratory system: Clear to auscultation. Respiratory effort  normal. Cardiovascular system:  RRR.  Gastrointestinal system: Abdomen is nondistended, soft and nontender.  Normal bowel sounds heard. Central nervous system: Alert and orientedx2. No focal neurological deficits. Extremities:  no edema Skin: No rashes, lesions or ulcers Psychiatry: calm and cooperative, not a reliable historian due to impaired memory     Data Reviewed: I have personally reviewed  labs and visualized  imaging studies since the last encounter and formulate the plan        Scheduled Meds:  acetaminophen (TYLENOL) oral liquid 160 mg/5 mL  1,000 mg Oral TID   aspirin  81 mg Per Tube Daily   atorvastatin  20 mg Oral Daily   calcium carbonate  1 tablet Oral BID WC   Chlorhexidine Gluconate Cloth  6 each Topical Daily   cholestyramine light  4 g Per Tube BID   clopidogrel  75 mg Per Tube Daily   feeding supplement  237 mL Oral TID BM   feeding supplement (PROSource TF20)  60 mL Per Tube Daily   losartan  50 mg Oral Daily   metoprolol tartrate  25 mg Oral BID   mirtazapine  7.5 mg Oral QHS   nystatin  5 mL Oral QID   sucralfate  1 g Oral TID WC & HS   Continuous Infusions:  sodium chloride 10 mL/hr at 06/07/22 0645   cefTRIAXone (ROCEPHIN)  IV     feeding supplement (OSMOLITE 1.5 CAL) 1,000 mL (06/13/22 2200)     LOS: 9 days   Time spent:  33mns  FFlorencia Reasons MD PhD FACP [facility]  Available via Epic secure chat 7am-7pm for nonurgent issues Please page for urgent issues To page the attending provider between 7A-7P or the covering provider during after hours 7P-7A, please log into the web site [email or web address] and access using universal [place] password for that web site. If you do not have the password, please call the hospital operator.    06/14/2022, 11:18 AM

## 2022-06-14 NOTE — Progress Notes (Signed)
Subjective: The patient seen and examined today.  His wife was at the bedside.  He continues to complain of significant fatigue and weakness as well as the baseline shortness of breath.  He is currently receiving nutrition via nasogastric tube.  He was recently found to have scattered small acute infarcts in the supratentorial and and infratentorial brain without hemorrhage or mass effect.  There was no evidence for intracranial metastatic disease.  He has no current fever or chills.  He has no nausea, vomiting, diarrhea or constipation. Objective: Vital signs in last 24 hours: Temp:  [98.2 F (36.8 C)-98.6 F (37 C)] 98.3 F (36.8 C) (02/29 1229) Pulse Rate:  [104-115] 106 (02/29 1229) Resp:  [18-20] 20 (02/29 1229) BP: (93-138)/(70-92) 115/71 (02/29 1229) SpO2:  [98 %-99 %] 99 % (02/29 1229) Weight:  [135 lb 2.3 oz (61.3 kg)] 135 lb 2.3 oz (61.3 kg) (02/29 0500)  Intake/Output from previous day: 02/28 0701 - 02/29 0700 In: 1296 [NG/GT:1196; IV Piggyback:100] Out: 2300 [Urine:2300] Intake/Output this shift: Total I/O In: 955 [P.O.:240; NG/GT:615; IV Piggyback:100] Out: 1200 [Urine:1200]  General appearance: alert, cooperative, fatigued, and no distress Resp: clear to auscultation bilaterally Cardio: regular rate and rhythm, S1, S2 normal, no murmur, click, rub or gallop GI: soft, non-tender; bowel sounds normal; no masses,  no organomegaly Extremities: extremities normal, atraumatic, no cyanosis or edema  Lab Results:  Recent Labs    06/12/22 0330 06/13/22 1902  WBC 5.1 5.0  HGB 9.0* 9.5*  HCT 27.6* 28.9*  PLT 104* 152   BMET Recent Labs    06/12/22 0330 06/14/22 0349  NA 138 133*  K 4.3 4.5  CL 106 105  CO2 25 24  GLUCOSE 122* 125*  BUN 29* 28*  CREATININE 0.53* 0.56*  CALCIUM 7.9* 7.8*    Studies/Results: No results found.  Medications: I have reviewed the patient's current medications.   Assessment/Plan: This is a very pleasant 72 years old white male  with recurrent non-small cell lung cancer, adenocarcinoma that was initially diagnosed as stage IIb in June 2021 and the patient had read disease recurrence and December 2023 with mediastinal lymphadenopathy.  He also has a history of GIST of the pelvis status post neoadjuvant treatment with Gleevec followed by surgical resection and currently on adjuvant treatment. The patient is currently undergoing a course of concurrent chemoradiation with low-dose chemotherapy with carboplatin for AUC of 2 and paclitaxel 45 Mg/M2 status post 3 cycles.  Has been tolerating this treatment well except for fatigue. His chemotherapy is currently on hold because of his recent admission with the pneumonia and sepsis secondary to Klebsiella pneumonia bacteremia.  The patient also had stroke during his hospitalization. His chemotherapy-induced neutropenia has significantly improved.  He also has resolution of his thrombocytopenia. I recommended for the patient to continue his course of radiotherapy as previously planned.  I will hold the concurrent chemotherapy for now. Will continue his current treatment for the pneumonia, malnutrition as well as a stroke as planned by the primary team. I will arrange for the patient a follow-up appointment with me after discharge for reevaluation and recommendation regarding his future treatment options. He has a stage III lung cancer and there is a potential cure from this condition with the course of radiotherapy. The patient and his wife are in agreement with the current plan. Thank you for taking good care of Mr. Hagen.  Please call if you have any questions.  LOS: 9 days    Eilleen Kempf 06/14/2022

## 2022-06-14 NOTE — Care Management Important Message (Signed)
Important Message  Patient Details IM Letter given. Name: THINH BEUTLER MRN: [id number] Date of Birth: 01/01/1951   Medicare Important Message Given:  Yes     Kerin Salen 06/14/2022, 10:44 AM

## 2022-06-14 NOTE — NC FL2 (Signed)
MEDICAID FL2 LEVEL OF CARE FORM     IDENTIFICATION  Patient Name: Jesse [name] Birthdate: 1950-08-24 Sex: male Admission Date (Current Location): 06/05/2022  [facility] and [place] Number:  Herbalist and Address:  [facility],  [address] [place], [place]      Provider Number: [id number]  Attending Physician Name and Address:  Florencia Reasons, MD  Relative Name and Phone Number:       Current Level of Care: Hospital Recommended Level of Care: [place] Prior Approval Number:    Date Approved/Denied: 06/14/22 PASRR Number: [id number]  Discharge Plan: SNF    Current Diagnoses: Patient Active Problem List   Diagnosis Date Noted   Drug-induced neutropenia ([place]) 06/07/2022   Protein-calorie malnutrition, severe 06/07/2022   Bacteremia due to Klebsiella pneumoniae 06/06/2022   Pneumonia of right middle lobe due to Klebsiella pneumoniae ([place]) 06/06/2022   Severe sepsis ([place]) 06/05/2022   Hypokalemia 06/05/2022   AKI (acute kidney injury) ([place]) 06/05/2022   Hyponatremia 0000000   Acute metabolic encephalopathy 0000000   Acute respiratory failure with hypoxia ([place]) 06/05/2022   CAP (community acquired pneumonia) 06/05/2022   Thrombocytopenia ([place]) 06/05/2022   Sepsis ([place]) 06/05/2022   Diarrhea 06/05/2022   Weight loss 05/28/2022   Malignant neoplasm of lung ([place]) 04/30/2022   Adenopathy 04/06/2022   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[place]) 08/21/2021   Encounter for antineoplastic chemotherapy 01/28/2020   Adenocarcinoma of left lung, stage 2 ([place]) 12/31/2019   Goals of care, counseling/discussion 12/31/2019   S/P lobectomy of lung 12/16/2019   Lung nodule 10/13/2019   Essential hypertension 09/28/2016   Coronary artery calcification 09/28/2016   Impaired glucose tolerance 09/28/2016   NEOPLASM, MALIGNANT, BLADDER, HX OF 07/18/2007   Vitamin D deficiency 06/03/2007   DIVERTICULOSIS,  COLON 01/14/2007   Osteoarthritis 01/14/2007   Dyslipidemia 01/09/2007   GERD 01/09/2007   REFLUX ESOPHAGITIS 06/01/2004   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    Orientation RESPIRATION BLADDER Height & Weight     Self, Situation, Place  Normal Continent, External catheter Weight: 135 lb 2.3 oz (61.3 kg) Height:  '5\' 9"'$  (175.3 cm)  BEHAVIORAL SYMPTOMS/MOOD NEUROLOGICAL BOWEL NUTRITION STATUS      Continent Diet (See dc summary)  AMBULATORY STATUS COMMUNICATION OF NEEDS Skin   Extensive Assist Verbally Normal                       Personal Care Assistance Level of Assistance  Bathing, Feeding, Dressing Bathing Assistance: Limited assistance Feeding assistance: Independent Dressing Assistance: Limited assistance     Functional Limitations Info  Sight, Hearing, Speech Sight Info: Adequate Hearing Info: Adequate Speech Info: Adequate    SPECIAL CARE FACTORS FREQUENCY  PT (By licensed PT), OT (By licensed OT)     PT Frequency: 5x/week OT Frequency: 5x/week            Contractures Contractures Info: Not present    Additional Factors Info  Code Status, Allergies Code Status Info: Full Allergies Info: NKA           Current Medications (06/14/2022):  This is the current hospital active medication list Current Facility-Administered Medications  Medication Dose Route Frequency Provider Last Rate Last Admin   0.9 %  sodium chloride infusion   Intravenous PRN Toy Baker, MD 10 mL/hr at 06/07/22 0645 New Bag at 06/07/22 0645   acetaminophen (TYLENOL) 160 MG/5ML solution 1,000 mg  1,000 mg Oral TID Earlie Counts,  NP   1,000 mg at 06/13/22 2233   acetaminophen (TYLENOL) 160 MG/5ML solution 650 mg  650 mg Per Tube Q6H PRN Minda Ditto, RPH   650 mg at 06/11/22 2119   Or   acetaminophen (TYLENOL) suppository 650 mg  650 mg Rectal Q6H PRN Minda Ditto, RPH       aspirin chewable tablet 81 mg  81 mg Per Tube Daily Madueme, Elvira C, RPH   81 mg at 06/13/22 1009    atorvastatin (LIPITOR) tablet 20 mg  20 mg Oral Daily Shelly Coss, MD   20 mg at 06/13/22 1009   calcium carbonate (TUMS - dosed in mg elemental calcium) chewable tablet 200 mg of elemental calcium  1 tablet Oral BID WC Florencia Reasons, MD   200 mg of elemental calcium at 06/13/22 1907   cefTRIAXone (ROCEPHIN) 2 g in sodium chloride 0.9 % 100 mL IVPB  2 g Intravenous Q24H Campbell Riches, MD   Stopped at 06/13/22 1218   Chlorhexidine Gluconate Cloth 2 % PADS 6 each  6 each Topical Daily Rai, Ripudeep K, MD   6 each at 06/13/22 2350   cholestyramine light (PREVALITE) packet 4 g  4 g Per Tube BID Minda Ditto, RPH   4 g at 06/13/22 2350   clopidogrel (PLAVIX) tablet 75 mg  75 mg Per Tube Daily Madueme, Elvira C, RPH   75 mg at 06/13/22 1008   feeding supplement (ENSURE ENLIVE / ENSURE PLUS) liquid 237 mL  237 mL Oral TID BM Shelly Coss, MD   237 mL at 06/13/22 2318   feeding supplement (OSMOLITE 1.5 CAL) liquid 1,000 mL  1,000 mL Per Tube Continuous Rai, Ripudeep K, MD 60 mL/hr at 06/13/22 2200 1,000 mL at 06/13/22 2200   feeding supplement (PROSource TF20) liquid 60 mL  60 mL Per Tube Daily Rai, Ripudeep K, MD   60 mL at 06/13/22 1010   HYDROcodone-acetaminophen (NORCO/VICODIN) 5-325 MG per tablet 1-2 tablet  1-2 tablet Per Tube Q4H PRN Minda Ditto, RPH   2 tablet at 06/13/22 0440   ipratropium-albuterol (DUONEB) 0.5-2.5 (3) MG/3ML nebulizer solution 3 mL  3 mL Nebulization Q6H PRN Raenette Rover, NP   3 mL at 06/07/22 0151   lidocaine (XYLOCAINE) 2 % viscous mouth solution 15 mL  15 mL Mouth/Throat Q3H PRN Earlie Counts, NP   15 mL at 06/12/22 1743   losartan (COZAAR) tablet 50 mg  50 mg Oral Daily Adhikari, Tamsen Meek, MD   50 mg at 06/13/22 1009   metoprolol tartrate (LOPRESSOR) tablet 25 mg  25 mg Oral BID Shelly Coss, MD   25 mg at 06/13/22 2234   mirtazapine (REMERON) tablet 7.5 mg  7.5 mg Oral QHS Rai, Ripudeep K, MD   7.5 mg at 06/13/22 2239   nystatin (MYCOSTATIN) 100000 UNIT/ML  suspension 500,000 Units  5 mL Oral QID Carlyle Basques, MD   500,000 Units at 06/13/22 2233   Oral care mouth rinse  15 mL Mouth Rinse PRN Doutova, Anastassia, MD       sucralfate (CARAFATE) 1 GM/10ML suspension 1 g  1 g Oral TID WC & HS Earlie Counts, NP   1 g at 06/13/22 2234     Discharge Medications: Please see discharge summary for a list of discharge medications.  Relevant Imaging Results:  Relevant Lab Results:   Additional Information SSN: [id number]  Servando Snare, LCSW

## 2022-06-14 NOTE — Progress Notes (Signed)
Brief Nutrition Note  Received second consult for calorie count. Patient completed calorie count yesterday, mostly consuming nutrition supplements and no food.  Has remained on goal TF, tolerating well.   - Calorie count to start today (2/29) at breakfast and end after dinner tomorrow (3/1).  - Please document % intake of all food, drinks, and nutrition supplements patient consumes.  - If patient skips meals, please put a "0%" for that meal.  - Discussed plan with RN.   - Continue goal TF for now, until patient showing adequate oral intake (>75%). Osmolite 1.5 @ 43m/hr 60 ml Prosource TF daily -Tube feeding regimen provides 2240 kcals, 110 grams of protein, and 1097 ml of H2O.     ASamson FredericRD, LDN For contact information, refer to [facility]

## 2022-06-14 NOTE — TOC Progression Note (Signed)
Transition of Care [facility]) - Progression Note    Patient Details  Name: Jesse [name] MRN: [id number] Date of Birth: 05/12/1950  Transition of Care [facility]) CM/SW Contact  Servando Snare, [place] Phone Number: 06/14/2022, 10:36 AM  Clinical Narrative:    TOC following for SNF placement. TOC faxed patient out for SNF. Patient followed by palliative.  PLAN: TBD   Expected Discharge Plan: Skilled Nursing Facility Barriers to Discharge: Continued Medical Work up  Expected Discharge Plan and Services In-house Referral: NA, Nutrition Discharge Planning Services: CM Consult Post Acute Care Choice: [place] Living arrangements for the past 2 months: Single Family Home                 DME Arranged: N/A DME Agency: NA       HH Arranged: NA HH Agency: NA         Social Determinants of Health (SDOH) Interventions SDOH Screenings   Food Insecurity: No Food Insecurity (06/06/2022)  Housing: Low Risk  (06/06/2022)  Transportation Needs: No Transportation Needs (06/06/2022)  Utilities: Not At Risk (06/06/2022)  Depression (PHQ2-9): Low Risk  (05/07/2022)  Financial Resource Strain: Low Risk  (07/18/2021)  Physical Activity: Sufficiently Active (07/18/2021)  Social Connections: Moderately Isolated (07/12/2020)  Stress: No Stress Concern Present (07/18/2021)  Tobacco Use: Medium Risk (06/11/2022)    Readmission Risk Interventions    06/11/2022    5:42 PM  Readmission Risk Prevention Plan  Transportation Screening Complete  PCP or Specialist Appt within 3-5 Days Complete  HRI or [place] Complete  Social Work Consult for [place] Planning/Counseling Complete  Palliative Care Screening Not Applicable  Medication Review Press photographer) Complete

## 2022-06-14 NOTE — Progress Notes (Signed)
Patient's oral intake continues to be poor, but patient was able to get out of the bed and into the chair with two person assist. Patient tolerated the transfer well.

## 2022-06-14 NOTE — Progress Notes (Signed)
When RN walk in the room, NG feeding tube removed. Pt denied pullout the tube by himself. RN assessed the pt, lung sound clear and diminished. RN notified on call and will replace the tube.

## 2022-06-14 NOTE — Plan of Care (Signed)
  Problem: Respiratory: Goal: Ability to maintain adequate ventilation will improve Outcome: Progressing   Problem: Activity: Goal: Risk for activity intolerance will decrease Outcome: Progressing   Problem: Coping: Goal: Level of anxiety will decrease Outcome: Progressing   Problem: Elimination: Goal: Will not experience complications related to urinary retention Outcome: Progressing   Problem: Pain Managment: Goal: General experience of comfort will improve Outcome: Progressing   Problem: Safety: Goal: Ability to remain free from injury will improve Outcome: Progressing   Problem: Skin Integrity: Goal: Risk for impaired skin integrity will decrease Outcome: Progressing

## 2022-06-15 ENCOUNTER — Inpatient Hospital Stay (HOSPITAL_COMMUNITY): Payer: Medicare Other

## 2022-06-15 ENCOUNTER — Other Ambulatory Visit: Payer: Self-pay

## 2022-06-15 ENCOUNTER — Ambulatory Visit
Admission: RE | Admit: 2022-06-15 | Discharge: 2022-06-15 | Disposition: A | Discharge status: Home / Self Care | Payer: Medicare Other | Source: Ambulatory Visit | Attending: Radiation Oncology | Admitting: Radiation Oncology

## 2022-06-15 DIAGNOSIS — K208 Other esophagitis without bleeding: Secondary | ICD-10-CM | POA: Diagnosis not present

## 2022-06-15 DIAGNOSIS — C771 Secondary and unspecified malignant neoplasm of intrathoracic lymph nodes: Secondary | ICD-10-CM | POA: Insufficient documentation

## 2022-06-15 DIAGNOSIS — F17210 Nicotine dependence, cigarettes, uncomplicated: Secondary | ICD-10-CM | POA: Diagnosis not present

## 2022-06-15 DIAGNOSIS — Z510 Encounter for antineoplastic radiation therapy: Secondary | ICD-10-CM | POA: Diagnosis not present

## 2022-06-15 DIAGNOSIS — B370 Candidal stomatitis: Secondary | ICD-10-CM | POA: Diagnosis not present

## 2022-06-15 DIAGNOSIS — R1319 Other dysphagia: Secondary | ICD-10-CM

## 2022-06-15 DIAGNOSIS — R6520 Severe sepsis without septic shock: Secondary | ICD-10-CM | POA: Diagnosis not present

## 2022-06-15 DIAGNOSIS — C3412 Malignant neoplasm of upper lobe, left bronchus or lung: Secondary | ICD-10-CM | POA: Diagnosis not present

## 2022-06-15 DIAGNOSIS — A419 Sepsis, unspecified organism: Secondary | ICD-10-CM | POA: Diagnosis not present

## 2022-06-15 DIAGNOSIS — D701 Agranulocytosis secondary to cancer chemotherapy: Secondary | ICD-10-CM | POA: Diagnosis not present

## 2022-06-15 LAB — RAD ONC ARIA SESSION SUMMARY
Course Elapsed Days: 31
Plan Fractions Treated to Date: 22
Plan Prescribed Dose Per Fraction: 2 Gy
Plan Total Fractions Prescribed: 30
Plan Total Prescribed Dose: 60 Gy
Reference Point Dosage Given to Date: 44 Gy
Reference Point Session Dosage Given: 2 Gy
Session Number: 22

## 2022-06-15 LAB — GLUCOSE, CAPILLARY
Glucose-Capillary: 117 mg/dL — ABNORMAL HIGH (ref 70–99)
Glucose-Capillary: 120 mg/dL — ABNORMAL HIGH (ref 70–99)
Glucose-Capillary: 120 mg/dL — ABNORMAL HIGH (ref 70–99)
Glucose-Capillary: 130 mg/dL — ABNORMAL HIGH (ref 70–99)
Glucose-Capillary: 136 mg/dL — ABNORMAL HIGH (ref 70–99)
Glucose-Capillary: 147 mg/dL — ABNORMAL HIGH (ref 70–99)

## 2022-06-15 LAB — CBC
HCT: 28.7 % — ABNORMAL LOW (ref 39.0–52.0)
Hemoglobin: 8.8 g/dL — ABNORMAL LOW (ref 13.0–17.0)
MCH: 30.2 pg (ref 26.0–34.0)
MCHC: 30.7 g/dL (ref 30.0–36.0)
MCV: 98.6 fL (ref 80.0–100.0)
Platelets: 202 10*3/uL (ref 150–400)
RBC: 2.91 MIL/uL — ABNORMAL LOW (ref 4.22–5.81)
RDW: 14.7 % (ref 11.5–15.5)
WBC: 4.8 10*3/uL (ref 4.0–10.5)
nRBC: 0 % (ref 0.0–0.2)

## 2022-06-15 LAB — MAGNESIUM: Magnesium: 1.9 mg/dL (ref 1.7–2.4)

## 2022-06-15 LAB — BASIC METABOLIC PANEL
Anion gap: 7 (ref 5–15)
BUN: 32 mg/dL — ABNORMAL HIGH (ref 8–23)
CO2: 24 mmol/L (ref 22–32)
Calcium: 8.4 mg/dL — ABNORMAL LOW (ref 8.9–10.3)
Chloride: 105 mmol/L (ref 98–111)
Creatinine, Ser: 0.55 mg/dL — ABNORMAL LOW (ref 0.61–1.24)
GFR, Estimated: >60 mL/min (ref >60)
Glucose, Bld: 113 mg/dL — ABNORMAL HIGH (ref 70–99)
Potassium: 4.6 mmol/L (ref 3.5–5.1)
Sodium: 136 mmol/L (ref 135–145)

## 2022-06-15 MED ORDER — ALUM & MAG HYDROXIDE-SIMETH 200-200-20 MG/5ML PO SUSP
30.0000 mL | ORAL | Status: DC | PRN
  Administered 2022-06-15 – 2022-06-27 (×2): 30 mL via ORAL
  Filled 2022-06-15 (×2): qty 30

## 2022-06-15 MED ORDER — FLUCONAZOLE IN SODIUM CHLORIDE 200-0.9 MG/100ML-% IV SOLN
200.0000 mg | INTRAVENOUS | Status: DC
  Administered 2022-06-15: 200 mg via INTRAVENOUS
  Filled 2022-06-15 (×2): qty 100

## 2022-06-15 MED ORDER — PANTOPRAZOLE SODIUM 40 MG IV SOLR
40.0000 mg | INTRAVENOUS | Status: DC
  Administered 2022-06-15 – 2022-06-22 (×8): 40 mg via INTRAVENOUS
  Filled 2022-06-15 (×7): qty 10

## 2022-06-15 MED FILL — Dexamethasone Sodium Phosphate Inj 100 MG/10ML: INTRAMUSCULAR | Qty: 1 | Status: AC

## 2022-06-15 NOTE — Progress Notes (Signed)
OT Cancellation Note  Patient Details Name: Jesse [name] MRN: [id number] DOB: 02-Jul-1950   Cancelled Treatment:    Reason Eval/Treat Not Completed: Patient at procedure or test/ unavailable  Geraldyne Barraclough L Eugune Sine 06/15/2022, 10:46 AM

## 2022-06-15 NOTE — Consult Note (Addendum)
Referring Provider: Dr. Florencia Reasons Primary Care Physician:  [name], MD Primary Gastroenterologist:  Dr. Silvano Rusk   Reason for Consultation: Odynophagia  HPI: Jesse [name] is a 72 y.o. 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 He was admitted to the hospital 06/05/2022 due to altered mental status and progressive weakness. He was septic secondary to right lung pneumonia, tachycardic and hypotensive with pancytopenia.  He received IV hydration and antibiotics and his clinical status stabilized. 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 A GI consult was requested due to worsening odynophagia in the setting of having oral thrush which is currently being treated with Nystatin solution. He was also started on Carafate solution for possible radiation-induced esophagitis.  He has a history of GERD for which she takes Omeprazole '40mg'$  p.o. daily. He developed mild odynophagia 3 days after his first radiation therapy mid February 2024.  He initially described having a scratchy throat without significant pain when swallowing.  However, since this hospitalization his odynophagia has worsened.  He described having pain swallowing pills in applesauce for the past few days.  However, he stated today his swallowing is painful and he is comfortably swallowing his protein drink and pills.  His nurse is at the bedside and she agreed that he is swallowing his medications much easier today than yesterday.  He is experiencing heartburn for the past few days as well.  No nausea or  vomiting.  No upper or lower abdominal pain.  He passed a loose mushy brown stool earlier this morning.  No rectal bleeding or black stools.  He underwent an EGD in 2006 which showed reflux esophagitis.  His nurse reported he was confused during the night and he pulled out his NG tube a few times.  No overt confusion at this time.  His wife is at the bedside.  PAST GI PROCEDURES:  Flexible sigmoidoscopy 10/21/2019: - Rectal mass.  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  - The sigmoid colon is normal.  - No specimens collected.   Colonoscopy 12/11/2016: - Moderate diverticulosis in the sigmoid colon and in the descending colon.  - The examination was otherwise normal on direct and retroflexion views.  - No specimens collected.  EGD 06/01/2004: Reflux esophagitis Chronic gastritis, negative H. pylori  Colonoscopy 08/05/2003: -Sigmoid diverticulosis, no polyps   Past Medical History:  Diagnosis Date   Anemia    Cancer ([place])    bladder cancer   COPD (chronic obstructive pulmonary disease) ([place])    mild    DIVERTICULOSIS, COLON 01/14/2007   Qualifier: Diagnosis of  By: Paulina Fusi RN, [name]    GASTRITIS, CHRONIC 06/01/2004   Qualifier: Diagnosis of  By: Nolon Rod CMA ([place]), Robin     GERD 01/09/2007   Qualifier: Diagnosis of  By: Sherren Mocha RN, Ellen     Heart murmur    as a child; no murmur heard 12/14/19   HYPERLIPIDEMIA 01/09/2007   Qualifier: Diagnosis of  By: Scherrie Gerlach     Hypertension    Lung mass    left upper nodule   Macular degeneration    right eye   NEOPLASM, MALIGNANT,  BLADDER, HX OF 2002   Qualifier: Diagnosis of  By: Nolon Rod CMA (AAMA), Robin  / no chemo or radiation   OSTEOARTHRITIS 01/14/2007   Qualifier: Diagnosis of  By: Paulina Fusi, RN, [name] - knees   Rectal mass    Reflux esophagitis 06/01/2004   Qualifier: Diagnosis of  By: Nolon Rod CMA (AAMA), Robin     TOBACCO USER 02/16/2009   Qualifier: Diagnosis of  By: Burnice Logan  MD, Doretha Sou    VITAMIN D DEFICIENCY  06/03/2007   Qualifier: Diagnosis of  By: Burnice Logan  MD, Doretha Sou     Past Surgical History:  Procedure Laterality Date   BREAST BIOPSY Left 03/16/2022   [place] LT RADIOACTIVE SEED LOC 03/16/2022 [facility] MAMMOGRAPHY   BRONCHIAL BIOPSY  10/13/2019   Procedure: BRONCHIAL BIOPSIES;  Surgeon: Garner Nash, DO;  Location: [facility];  Service: Pulmonary;;   BRONCHIAL BRUSHINGS  10/13/2019   Procedure: BRONCHIAL BRUSHINGS;  Surgeon: Garner Nash, DO;  Location: [facility];  Service: Pulmonary;;   BRONCHIAL NEEDLE ASPIRATION BIOPSY  10/13/2019   Procedure: BRONCHIAL NEEDLE ASPIRATION BIOPSIES;  Surgeon: Garner Nash, DO;  Location: [facility];  Service: Pulmonary;;   BRONCHIAL NEEDLE ASPIRATION BIOPSY  04/11/2022   Procedure: BRONCHIAL NEEDLE ASPIRATION BIOPSIES;  Surgeon: Garner Nash, DO;  [facility];  Service: Cardiopulmonary;;   BRONCHIAL WASHINGS  10/13/2019   Procedure: BRONCHIAL WASHINGS;  Surgeon: Garner Nash, DO;  Location: [facility];  Service: Pulmonary;;   COLONOSCOPY     multiple   DIVERTING ILEOSTOMY N/A 12/14/2021   Procedure: DIVERTING ILEOSTOMY;  Surgeon: Michael Boston, MD;  Location: [facility];  Service: General;  Laterality: N/A;   ENDOBRONCHIAL ULTRASOUND Bilateral 04/11/2022   Procedure: ENDOBRONCHIAL ULTRASOUND;  Surgeon: Garner Nash, DO;  Location: [facility];  Service: Cardiopulmonary;  Laterality: Bilateral;   ILEOSTOMY CLOSURE N/A 03/22/2022   Procedure: OPEN TAKEDOWN OF LOOP ILEOSTOMY;  Surgeon: Michael Boston, MD;  Location: [facility];  Service: General;  Laterality: N/A;  GEN w/ERAS PATHWAY LOCAL   INTERCOSTAL NERVE BLOCK Left 12/16/2019   Procedure: INTERCOSTAL NERVE BLOCK;  Surgeon: Lajuana Matte, MD;  Location: [place];  Service: Thoracic;  Laterality: Left;   KNEE SURGERY  07/20/2008   bilat.   NASAL SEPTUM SURGERY  1995   NODE DISSECTION Left 12/16/2019   Procedure: NODE DISSECTION;  Surgeon: Lajuana Matte, MD;   Location: [place];  Service: Thoracic;  Laterality: Left;   RADIOACTIVE SEED GUIDED AXILLARY SENTINEL LYMPH NODE Left 03/20/2022   Procedure: RADIOACTIVE SEED GUIDED LEFT AXILLARY SENTINEL LYMPH NODE BIOPSY;  Surgeon: Erroll Luna, MD;  Location: [place];  Service: General;  Laterality: Left;   RECTAL EXAM UNDER ANESTHESIA N/A 03/22/2022   Procedure: ANORECTAL EXAMINATION UNDER ANESTHESIA;  Surgeon: Michael Boston, MD;  [facility];  Service: General;  Laterality: N/A;   TONSILLECTOMY     TRANSURETHRAL RESECTION OF BLADDER TUMOR  2002   UPPER GASTROINTESTINAL ENDOSCOPY     VIDEO BRONCHOSCOPY  04/11/2022   Procedure: VIDEO BRONCHOSCOPY;  Surgeon: Garner Nash, DO;  [facility];  Service: Cardiopulmonary;;   VIDEO BRONCHOSCOPY WITH ENDOBRONCHIAL NAVIGATION N/A 10/13/2019   Procedure: VIDEO BRONCHOSCOPY WITH ENDOBRONCHIAL NAVIGATION;  Surgeon: Garner Nash, DO;  Location: [place];  Service: Pulmonary;  Laterality: N/A;   WISDOM TOOTH EXTRACTION     XI ROBOTIC ASSISTED LOWER ANTERIOR RESECTION N/A 12/14/2021   Procedure: XI ROBOTIC ASSISTED LOWER ANTERIOR ULTRA LOW RECTOSIGMOID RESECTION, COLOANAL HAND SEWN ANASTOMOSIS,  AND BILATERAL TAP BLOCK;  Surgeon: Michael Boston, MD;  [facility];  Service: General;  Laterality: N/A;    Prior to Admission medications   Medication Sig Start Date End Date Taking? Authorizing Provider  acetaminophen (TYLENOL) 500 MG tablet Take 1,000 mg by mouth every 8 (eight) hours as needed for moderate pain.   Yes [provider]  amLODipine (NORVASC) 5 MG tablet Take 1 tablet (5 mg total) by mouth daily. 10/25/21  Yes [name], MD  atorvastatin (LIPITOR) 40 MG tablet Take 1/2 tablet (20 mg total) by mouth daily. 10/25/21  Yes [name], MD  Cholecalciferol (VITAMIN D3 PO) Take 2,000 Units by mouth daily.   Yes [provider]  cholestyramine Lucrezia Starch) 4 GM/DOSE powder Take 4 g by mouth 2 (two) times daily.   Yes  [provider]  diphenoxylate-atropine (LOMOTIL) 2.5-0.025 MG tablet Take 2 tablets by mouth 4 (four) times daily as needed for diarrhea or loose stools.   Yes [provider]  ferrous sulfate 325 (65 FE) MG EC tablet Take 1 tablet (325 mg total) by mouth 2 (two) times daily. 03/26/22 03/26/23 Yes Michael Boston, MD  imatinib (GLEEVEC) 400 MG tablet Take 1 tablet (400 mg total) by mouth daily. Take with meals and large glass of water.Caution:Chemotherapy. Patient taking differently: Take 400 mg by mouth at bedtime. Take with meals and large glass of water.Caution:Chemotherapy. 03/19/22  Yes Curt Bears, MD  loperamide (IMODIUM A-D) 2 MG tablet Take 4 mg by mouth 4 (four) times daily as needed for diarrhea or loose stools.   Yes [provider]  megestrol (MEGACE ES) 625 MG/5ML suspension TAKE 5 MLS (625 MG TOTAL) BY MOUTH DAILY. 05/23/22  Yes Curt Bears, MD  mirtazapine (REMERON) 15 MG tablet Take 1 tablet (15 mg total) by mouth at bedtime. 05/28/22  Yes Heilingoetter, Cassandra L, PA-C  Multiple Vitamins-Minerals (PRESERVISION AREDS 2) CAPS Take 1 capsule by mouth 2 (two) times daily.   Yes [provider]  omeprazole (PRILOSEC) 40 MG capsule Take 1 capsule (40 mg total) by mouth daily. 10/25/21  Yes [name], MD  ondansetron (ZOFRAN) 4 MG tablet Take 4 mg by mouth every 8 (eight) hours as needed for nausea or vomiting.   Yes [provider]  OVER THE COUNTER MEDICATION Take 3 tablets by mouth daily.  Balance of [employer]   Yes [provider]  OVER THE COUNTER MEDICATION Take 3 tablets by mouth daily. Balance of [employer]   Yes [provider]  polycarbophil (FIBERCON) 625 MG tablet Take 2,500 mg by mouth daily. Taking 4 tabs ( 625 mg each)  daily   Yes [provider]  prochlorperazine (COMPAZINE) 10 MG tablet Take 1 tablet (10 mg total) by mouth every 6 (six) hours as needed for nausea or vomiting.  04/30/22  Yes Curt Bears, MD  sucralfate (CARAFATE) 1 g tablet Take 1 tablet by mouth 4 times daily -  with meals and at bedtime. 05/28/22  Yes Heilingoetter, Cassandra L, PA-C  potassium chloride SA (KLOR-CON M) 20 MEQ tablet Take 1 tablet (20 mEq total) by mouth daily. 06/04/22   Heilingoetter, Cassandra L, PA-C    Current Facility-Administered Medications  Medication Dose Route Frequency Provider Last Rate Last Admin   0.9 %  sodium chloride infusion   Intravenous PRN Toy Baker, MD 10 mL/hr at 06/07/22 0645 New Bag at 06/07/22 0645   acetaminophen (TYLENOL) 160 MG/5ML solution 1,000 mg  1,000 mg  Oral TID Earlie Counts, NP   1,000 mg at 06/15/22 0957   acetaminophen (TYLENOL) 160 MG/5ML solution 650 mg  650 mg Per Tube Q6H PRN Minda Ditto, RPH   650 mg at 06/11/22 2119   Or   acetaminophen (TYLENOL) suppository 650 mg  650 mg Rectal Q6H PRN Minda Ditto, RPH       aspirin chewable tablet 81 mg  81 mg Per Tube Daily Madueme, Elvira C, RPH   81 mg at 06/15/22 1000   atorvastatin (LIPITOR) tablet 20 mg  20 mg Oral Daily Shelly Coss, MD   20 mg at 06/15/22 [id number]   cholestyramine light (PREVALITE) packet 4 g  4 g Per Tube BID Minda Ditto, RPH   4 g at 06/15/22 1006   clopidogrel (PLAVIX) tablet 75 mg  75 mg Per Tube Daily Madueme, Elvira C, RPH   75 mg at 06/15/22 1000   feeding supplement (ENSURE ENLIVE / ENSURE PLUS) liquid 237 mL  237 mL Oral TID BM Shelly Coss, MD   237 mL at 06/14/22 2051   feeding supplement (OSMOLITE 1.5 CAL) liquid 1,000 mL  1,000 mL Per Tube Continuous Rai, Ripudeep K, MD 60 mL/hr at 06/15/22 0513 1,000 mL at 06/15/22 0513   feeding supplement (PROSource TF20) liquid 60 mL  60 mL Per Tube Daily Rai, Ripudeep K, MD   60 mL at 06/15/22 1001   HYDROcodone-acetaminophen (NORCO/VICODIN) 5-325 MG per tablet 1-2 tablet  1-2 tablet Per Tube Q4H PRN Minda Ditto, RPH   2 tablet at 06/13/22 0440   ipratropium-albuterol (DUONEB) 0.5-2.5 (3) MG/3ML  nebulizer solution 3 mL  3 mL Nebulization Q6H PRN Raenette Rover, NP   3 mL at 06/07/22 0151   lidocaine (XYLOCAINE) 2 % viscous mouth solution 15 mL  15 mL Mouth/Throat Q3H PRN Earlie Counts, NP   15 mL at 06/14/22 1249   losartan (COZAAR) tablet 50 mg  50 mg Oral Daily Shelly Coss, MD   50 mg at 06/15/22 0955   metoprolol tartrate (LOPRESSOR) tablet 25 mg  25 mg Oral BID Shelly Coss, MD   25 mg at 06/15/22 0953   mirtazapine (REMERON) tablet 7.5 mg  7.5 mg Oral QHS Rai, Ripudeep K, MD   7.5 mg at 06/14/22 2213   nystatin (MYCOSTATIN) 100000 UNIT/ML suspension 500,000 Units  5 mL Oral QID Carlyle Basques, MD   500,000 Units at 06/15/22 1005   Oral care mouth rinse  15 mL Mouth Rinse PRN Doutova, Anastassia, MD       sucralfate (CARAFATE) 1 GM/10ML suspension 1 g  1 g Oral TID WC & HS Earlie Counts, NP   1 g at 06/15/22 0959    Allergies as of 06/05/2022   (No Known Allergies)    Family History  Problem Relation Age of Onset   Dementia Mother    Diabetes Father    Mental illness Father    Peripheral vascular disease Brother     Social History   Socioeconomic History   Marital status: Married    Spouse name: Not on file   Number of children: 3   Years of education: Not on file   Highest education level: Not on file  Occupational History    Comment: semi retired   Tobacco Use   Smoking status: Former    Packs/day: 0.50    Years: 40.00    Total pack years: 20.00    Types: Cigarettes    Quit  date: 09/21/2019    Years since quitting: 2.7   Smokeless tobacco: Never  Vaping Use   Vaping Use: Never used  Substance and Sexual Activity   Alcohol use: Yes    Alcohol/week: 15.0 standard drinks of alcohol    Types: 15 Standard drinks or equivalent per week    Comment: wine/beer/martini   Drug use: Not Currently    Types: Marijuana    Comment: Smokes marijuana occasional, last use in 08/2019   Sexual activity: Not Currently  Other Topics Concern   Not on file  Social  History Narrative   Married   3 children: 3 grandchildren; 3 great grandchildren   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   Enjoys yard work and [place] Strain: [place]  (07/18/2021)   Overall Financial Resource Strain (CARDIA)    Difficulty of Paying Living Expenses: Not hard at [place]: No [place] (06/06/2022)   Hunger Vital Sign    Worried About Running Out of Food in the Last Year: Never true    [place] in the Last Year: Never true  Transportation Needs: No Transportation Needs (06/06/2022)   PRAPARE - Hydrologist (Medical): No    Lack of Transportation (Non-Medical): No  Physical Activity: Sufficiently Active (07/18/2021)   Exercise Vital Sign    Days of Exercise per Week: 7 days    Minutes of Exercise per Session: 60 min  Stress: No Stress Concern Present (07/18/2021)   [place]    Feeling of Stress : Not at all  Social Connections: Moderately Isolated (07/12/2020)   Social Connection and Isolation Panel [NHANES]    Frequency of Communication with Friends and Family: More than three times a week    Frequency of Social Gatherings with Friends and Family: More than three times a week    Attends Religious Services: Never    Marine scientist or Organizations: No    Attends Archivist Meetings: Never    Marital Status: Married  Human resources officer Violence: Not At Risk (06/06/2022)   Humiliation, Afraid, Rape, and Kick questionnaire    Fear of Current or Ex-Partner: No    Emotionally Abused: No    Physically Abused: No    Sexually Abused: No    Review of Systems: Gen: + Weight loss.  CV: Denies chest pain, palpitations or edema. Resp: Denies cough, shortness of breath of hemoptysis.  GI: See HPI. GU : Denies urinary burning, blood in urine, increased urinary frequency or  incontinence. MS: Denies joint pain, muscles aches or weakness. Derm: Denies rash, itchiness, skin lesions or unhealing ulcers. Psych: + Confusion.  Heme: Denies easy bruising, bleeding. Neuro:  Denies headaches, dizziness or paresthesias. Endo:  Denies any problems with DM, thyroid or adrenal function.  Physical Exam: Vital signs in last 24 hours: Temp:  [97.8 F (36.6 C)-98.3 F (36.8 C)] 98.1 F (36.7 C) (03/01 1209) Pulse Rate:  [92-108] 92 (03/01 1209) Resp:  [20] 20 (03/01 1209) BP: (111-142)/(71-81) 128/75 (03/01 1209) SpO2:  [97 %-100 %] 98 % (03/01 1209) Weight:  [63.3 kg] 63.3 kg (03/01 0346) Last BM Date : 06/14/22 General: Alert chronically ill-appearing 72 year old male in no acute distress. Head:  Normocephalic and atraumatic. Eyes:  No scleral icterus. Conjunctiva pink. Ears:  Normal auditory acuity. Nose:  No deformity, discharge or lesions. Mouth:  Dentition intact. No ulcers or lesions.  White patches on tongue. Neck:  Supple. No lymphadenopathy or thyromegaly.  Lungs: Breath sounds clear throughout. No wheezes, rhonchi or crackles.  Heart: Regular rate and rhythm, no murmurs. Abdomen: Soft, nondistended.  Nontender.  Positive bowel sounds to all 4 quadrants.  NG tube with tube feedings infusing. Rectal: Deferred. Musculoskeletal:  Symmetrical without gross deformities.  Pulses:  Normal pulses noted. Extremities:  Without clubbing or edema. Neurologic:  Alert and  oriented x 4. No focal deficits.  Skin:  Intact without significant lesions or rashes. Psych:  Alert and cooperative. Normal mood and affect.  Intake/Output from previous day: 02/29 0701 - 03/01 0700 In: 1917.5 [P.O.:360; I.V.:92.5; NG/GT:1365; IV Piggyback:100] Out: 1500 [Urine:1500] Intake/Output this shift: No intake/output data recorded.  Lab Results: Recent Labs    06/13/22 1902  WBC 5.0  HGB 9.5*  HCT 28.9*  PLT 152   BMET Recent Labs    06/14/22 0349 06/15/22 0439  NA 133*  136  K 4.5 4.6  CL 105 105  CO2 24 24  GLUCOSE 125* 113*  BUN 28* 32*  CREATININE 0.56* 0.55*  CALCIUM 7.8* 8.4*   Studies/Results: DG Abd 1 View  Result Date: 06/15/2022 CLINICAL DATA:  NG tube placement. EXAM: ABDOMEN - 1 VIEW COMPARISON:  06/14/2022. FINDINGS: The bowel gas pattern is normal. The distal tip of the feeding tube terminates in the anticipated region of the gastric antrum. Mild airspace disease is noted at the right lung base. No radio-opaque calculi or other significant radiographic abnormality are seen. IMPRESSION: Enteric tube terminates in the anticipated region of the gastric antrum. Electronically Signed   By: Brett Fairy M.D.   On: 06/15/2022 04:41   DG Abd 1 View  Result Date: 06/15/2022 CLINICAL DATA:  Nasogastric tube placement EXAM: ABDOMEN - 1 VIEW COMPARISON:  None Available. FINDINGS: Nasoenteric feeding tube tip overlies the gastric fundus. Nonobstructive bowel gas pattern. No free intraperitoneal gas. Pelvis excluded from view. IMPRESSION: 1. Nasoenteric feeding tube tip overlies the gastric fundus. Electronically Signed   By: Fidela Salisbury M.D.   On: 06/15/2022 00:00    IMPRESSION/PLAN:  71 year old male admitted to the hospital 06/05/2022 with altered mental status and progressive weakness secondary to sepsis with right lung pneumonia and Klebsiella bacteremia treated with Vancomycin and Cefepime IV.  His respiratory status has stabilized. -Management per the medical service  History of GERD with odynophagia in setting of oral thrush concerning for esophageal candidiasis versus radiation esophagitis. On Oral Nystatin suspension and Carafate suspension 1 gm po tid. odynophagia has improved today however, he continues to have heartburn for the past few days. -Pantoprazole 40 mg IV daily -Continue tube feedings and dysphagia 3 diet as tolerated -Continue Carafate suspension 1 g p.o. 3 times daily -Consider empiric treatment with Diflucan for suspected  esophageal candidiasis -Defer endoscopic evaluation recommendations to Dr. 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 Iron 17.  Saturation ratio 10. Hg 9 -> 9.5. No overt GI bleeding.   CVA, small embolic ischemic per MRI.  On Plavix and ASA.  CHF. LV EF 40 - 45% per ECHO 06/12/2021.  History of rectal GIST s/p resection with ileostomy then subsequent ileostomy takedown on 03/22/2022    Noralyn Pick  06/15/2022, 1:23 PM   Attending Physician Note   I have taken a history, reviewed the chart and examined the patient. I performed a substantive portion of  this encounter, including complete performance of at least one of the key components, in conjunction with the APP. I agree with the APP's note, impression and recommendations with my edits. My additional impressions and recommendations are as follows.   Dysphagia, odynophagia, oral thrush, history of GERD. Dysphagia and odynophagia are improving on Mycostatin suspension. Suspect esophageal candidiasis.   Diflucan for 7-10 days Continue pantoprazole 40 mg qd, Carafate suspension 1g tid  Continue tube feedings until PO intake improves  If dysphagia, odynophagia does not resolve proceed with EGD   Lucio Edward, MD [facility] See AMION, [facility], for our on call provider

## 2022-06-15 NOTE — Progress Notes (Signed)
   Pt going for radiation therapy this morning    No new changes overnight  Pt comfortable in bed No new recommendations  Will 1  Make sure that patient is set up for a monitor (30 day ) after d/c  Keep on tele (eval for afib) 2.  Will make sure that pt has follow up in cardiology after    Will sign off for now   Please call with questions.      Signed, Dorris Carnes, MD  06/15/2022, 10:41 AM

## 2022-06-15 NOTE — Progress Notes (Signed)
       CROSS COVER NOTE  NAME: Jesse [name] MRN: [id number] DOB : 1951/01/14    Date of Service   06/15/2022     HPI/Events of Note   2215-notified by RN that patient has pulled out NG tube.  Feeding tube was replaced without complications.  X-ray verified, is in place.  Okay to use.  0400-RN reports NG tube has been pulled out again.  Patient denies pulling NG tube out.  Feeding tube to be placed again.  Will order x-ray to verify.  TeleSitter also ordered.    Interventions/ Plan          Raenette Rover, DNP, [place]

## 2022-06-15 NOTE — Progress Notes (Addendum)
Calorie Count Note  48 hour calorie count ordered.  Diet: DYS 3 Supplements: Ensure Plus High Protein TID, wife bringing in Ensure Complete  No meal intake documented.  *Patient did not order a breakfast or a dinner. Received chicken broth oatmeal but intake not documented. Supplements: 50% 1 Ensure, 100% 1 Ensure  Total intake (from supplements): 520 kcal (26% of minimum estimated needs)  30 protein (27% of minimum estimated needs)  *Of note, patient pulled out NGT x2 overnight, feeding tube replaced.  Nutrition Dx: Severe Malnutrition related to chronic illness (lung cancer currently undergoing chemotherapy and radiation therapy) as evidenced by severe fat depletion, severe muscle depletion, percent weight loss (15% in 6 months).   Goal: Patient will meet greater than or equal to 90% of their needs   Intervention:  - Calorie count to run through at least the end of today, 3/1.              - Please document % intake of all food, drinks, and supplements patient consumes.  -If patient skips meals, please put a "0%" for that meal.   -Ensure Plus High Protein po TID, each supplement provides 350 kcal and 20 grams of protein.  - Pt's wife also brining in Ensure Complete from home, provides 350 kcals and 30g protein per bottle.   - Automatic house trays to ensure pt receives 3 meals a day.  Continue TF via NGT as patient not yet showing adequate oral intake: Osmolite 1.5 @ 38m/hr 60 ml Prosource TF daily -Tube feeding regimen provides 2240 kcals, 110 grams of protein, and 1097 ml of H2O.    ASamson FredericRD, LDN For contact information, refer to [facility]

## 2022-06-15 NOTE — Progress Notes (Signed)
Occupational Therapy Treatment Patient Details Name: Jesse [name] MRN: [id number] DOB: Dec 04, 1950 Today's Date: 06/15/2022   History of present illness Patient is a 72 year old male with significant hx of lung and bladder CA currently on chemo and radiation treatments, who presented with severe fatigue and confusion, cyanosis and soft BPs. Admitted with sepsis from unspecified organism and PNA R lung. PMH CA, COPD, HLD, lung mass, bladder CA, macular degeneration, hx ileostomy, B knee surgery   OT comments  Patient min assist x 2 to stand and manage lines. Patient more oriented today. Patient found to be incontinent and could stand for pericare and then take steps to recliner. Exhibited more tolerance today. Cont POC.    Recommendations for follow up therapy are one component of a multi-disciplinary discharge planning process, led by the attending physician.  Recommendations may be updated based on patient status, additional functional criteria and insurance authorization.    Follow Up Recommendations  Skilled nursing-short term rehab (<3 hours/day)     Assistance Recommended at Discharge Frequent or constant Supervision/Assistance  Patient can return home with the following  A lot of help with walking and/or transfers;A lot of help with bathing/dressing/bathroom;Assistance with cooking/housework;Direct supervision/assist for medications management;Assist for transportation;Help with stairs or ramp for entrance   Equipment Recommendations  BSC/3in1    Recommendations for Other Services      Precautions / Restrictions Precautions Precautions: Fall;Other (comment) Precaution Comments: watch O2/HR/BPs, NG TF Restrictions Weight Bearing Restrictions: No       Mobility Bed Mobility                    Transfers                         Balance   Sitting-balance support: No upper extremity supported, Feet supported Sitting balance-[name]: Fair      Standing balance support: Single extremity supported Standing balance-[name]: Poor                             ADL either performed or assessed with clinical judgement   ADL Overall ADL's : Needs assistance/impaired                             Toileting- Clothing Manipulation and Hygiene: Sit to/from stand;Total assistance Toileting - Clothing Manipulation Details (indicate cue type and reason): found to be incontinent of BM and required total assist for perianal care and management of clothing. Hospital gown had to be changed. Patient unaware     Functional mobility during ADLs: Minimal assistance;+2 for physical assistance General ADL Comments: Min assist on each side of patient to stand - patient holding onto IV pole. Initial posterior lean but corrected easily. Patient needed to sit after approx 10 seconds. Stood again for pericare in standing and then able to take steps to recliner.    Extremity/Trunk Assessment              Vision       Perception     Praxis      Cognition Arousal/Alertness: Awake/alert Behavior During Therapy: WFL for tasks assessed/performed Overall Cognitive Status: Impaired/Different from baseline Area of Impairment: Attention, Memory, Awareness, Safety/judgement, Following commands                   Current Attention Level: Sustained Memory: Decreased short-term memory Following  Commands: Follows one step commands consistently       General Comments: Improved orientation this afternoon - alert to self and place        Exercises      Shoulder Instructions       General Comments      Pertinent Vitals/ Pain       Pain Assessment Pain Assessment: No/denies pain Faces Pain Scale: Hurts little more Pain Location: generalized Pain Descriptors / Indicators: Grimacing, Sore Pain Intervention(s): Monitored during session  Home Living                                           Prior Functioning/Environment              Frequency  Min 2X/week        Progress Toward Goals  OT Goals(current goals can now be found in the care plan section)  Progress towards OT goals: Progressing toward goals  Acute Rehab OT Goals Patient Stated Goal: get stronger OT Goal Formulation: With patient Time For Goal Achievement: 06/21/22 Potential to Achieve Goals: [place] Discharge plan remains appropriate    Co-evaluation          OT goals addressed during session: ADL's and self-care      AM-PAC OT "6 Clicks" Daily Activity     Outcome Measure   Help from another person eating meals?: None Help from another person taking care of personal grooming?: A Little Help from another person toileting, which includes using toliet, bedpan, or urinal?: Total Help from another person bathing (including washing, rinsing, drying)?: A Lot Help from another person to put on and taking off regular upper body clothing?: A Little Help from another person to put on and taking off regular lower body clothing?: A Lot 6 Click Score: 15    End of Session    OT Visit Diagnosis: Other abnormalities of gait and mobility (R26.89);Unsteadiness on feet (R26.81);Muscle weakness (generalized) (M62.81);Other symptoms and signs involving cognitive function;Pain   Activity Tolerance Patient limited by fatigue   Patient Left with call bell/phone within reach;in chair;with chair alarm set   Nurse Communication Mobility status        Time: [id number] OT Time Calculation (min): 15 min  Charges: OT General Charges $OT Visit: 1 Visit OT Treatments $Self Care/Home Management : 8-22 mins  Gustavo Lah, OTR/L [place]  Office [phone]   Lenward Chancellor 06/15/2022, 3:07 PM

## 2022-06-15 NOTE — Progress Notes (Signed)
Notified MD to see if an X-Ray can be ordered to check correct placement for patient's small bore tube feeding tube as it may have possibly moved while working with PT. RN wanted to verify whether the tube needed to be advanced to prevent the tube from coming out. MD placed an order for a STAT DG Abd 1 View to check for placement. Imaging showed the tube needed to be advanced. RN advanced the tube to 80 cm.

## 2022-06-15 NOTE — Plan of Care (Signed)
  Problem: Respiratory: Goal: Ability to maintain adequate ventilation will improve Outcome: Progressing   Problem: Activity: Goal: Risk for activity intolerance will decrease Outcome: Progressing   Problem: Coping: Goal: Level of anxiety will decrease Outcome: Progressing   Problem: Elimination: Goal: Will not experience complications related to urinary retention Outcome: Progressing   Problem: Pain Managment: Goal: General experience of comfort will improve Outcome: Progressing   Problem: Safety: Goal: Ability to remain free from injury will improve Outcome: Progressing   Problem: Skin Integrity: Goal: Risk for impaired skin integrity will decrease Outcome: Progressing

## 2022-06-15 NOTE — Progress Notes (Signed)
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[place]) s/p resection /ileostomy then undergoing ileostomy takedown on 03/22/2022 , has been on imatinib   h/o recurrent Adenocarcinoma of left lung getting concurrent radiation and chemotherapy  Present with confusion, acute hypoxia, he was unresponsive initially on presentation, found to have sepsis ,  Klebsiella bacteremia and pneumonia, also found to have small acute embolic ischemic strokes  Subjective:  He was confused and pulled out feeding tube twice last night, now he has a new feeding tube placed , wife at bedside,  oral intake remains very poor,  + oral thrush, reports odynophagia   no fever  Not oriented to time, oriented to place and person, not a reliable historian  Wife at bedside   Assessment & Plan:  Principal Problem:   Severe sepsis ([place]) Active Problems:   Dyslipidemia   Essential hypertension   Adenocarcinoma of left lung, stage 2 ([place])   Malignant neoplasm of lung ([place])   Hypokalemia   AKI (acute kidney injury) ([place])   Hyponatremia   Acute metabolic encephalopathy   Acute respiratory failure with hypoxia ([place])   CAP (community acquired pneumonia)   Thrombocytopenia ([place])   Sepsis ([place])   Diarrhea   Bacteremia due to Klebsiella pneumoniae   Pneumonia of right middle lobe due to Klebsiella pneumoniae ([place])   Drug-induced neutropenia ([place])   Protein-calorie malnutrition, severe    Assessment and Plan:  Severe sepsis present on admission due to Klebsiella pneumonia bacteremia and pneumonia  per  Last ID note on 2/27, finished total of 10days of  rocephin on 2/29 per ID recommendation   Acute metabolic encephalopathy: Unresponsive /confused on  presentation.   He has improved , fully alert, oriented to person and  place but not oriented to time , wife reports this is not his baseline  Acute CVAs -Incidental finding on MRI of the brain for cancer staging -Seen by neurology thought to be embolic, Patient was started on aspirin and Plavix. 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  Speech therapy recommended dysphagia 3 diet.  On Remeron.  Dietitian following. 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  C. difficile positive but toxin was negative.  GI pathogen panel negative.  On  Questran, On contact precaution.  Diarrhea has stopped, last bm documented was brown , mushy with ragged edges on 3/1  AKI Resolved   hypertension: Initially hypotensive but now blood pressure has trended up.  resumed losartan, metoprolol , titrate bp prn   Pancytopenia: Given filgrastim x 3.  Oncology was following.  Hemoglobin in the range of 9, platelets in the range of 100.  No evidence of acute blood loss.  Status post a unit of PRBC transfusion on 2/25.  Currently hemoglobin stable  Recurrent adenocarcinoma of the lung -Initially diagnosed in June 2021, status post left upper  lobectomy with lymph node dissection on December 16, 2019  -repeat video bronchoscopy with EBUS on April 11, 2022 and biopsy from the station 7 lymph node showed recurrent adenocarcinoma.  -The patient is currently undergoing a course of concurrent chemoradiation with weekly carboplatin for AUC of 2 and paclitaxel 45 Mg/M2. First dose May 14, 2022.  -Follows radiation oncology and medical oncology Dr. Julien Nordmann   GIST tumor of rectum -s/p Neoadjuvant treatment with imatinib 400 mg p.o. daily for the GIST tumor of the pelvic area. First dose started July 18, 2020. Status post 12 months of treatment. -s/p  robotic assisted very low anterior rectosigmoid resection with coloanal anastomosis, diverting loop ileostomy and wedge liver biopsy under the care of Dr. Johney Maine on December 14, 2021 and it showed minimal residual gastrointestinal stromal tumor.  -s/p undergoing ileostomy takedown on 03/22/2022  -home meds imatinib held F/u oncology Dr. Julien Nordmann   Deconditioning/weakness: PT/OT recommended SNF on discharge.  TOC following   Goals of care: Patient with history of lung cancer now with very poor oral intake, severe weakness.  Poor quality of life.  On tube feeding.  We requested palliative care consultation after discussion with wife,remains full code    Nutritional Assessment: The patient's BMI is: Body mass index is 20.61 kg/m.Marland Kitchen Seen by dietician.  I agree with the assessment and plan as outlined below: Nutrition Status: Nutrition Problem: Severe Malnutrition Etiology: chronic illness (lung cancer currently undergoing chemotherapy and radiation therapy) Signs/Symptoms: severe fat depletion, severe muscle depletion, percent weight loss (15% in 6 months) Percent weight loss: 15 % (in 6 months) Interventions: Ensure Enlive (each supplement provides 350kcal and 20 grams of protein), Tube feeding  .      I have Reviewed nursing notes, Vitals, pain scores, I/o's, Lab results and  imaging  results since pt's last encounter, details please see discussion above  I ordered the following labs:  Unresulted Labs (From admission, onward)     Start     Ordered   06/18/22 0500  Phosphorus  Every Monday (0500),   R     Question:  Specimen collection method  Answer:  Lab=Lab collect   06/14/22 1118   06/15/22 [id number]  Basic metabolic panel  Daily,   R     Question:  Specimen collection method  Answer:  Lab=Lab collect   06/14/22 1118   06/15/22 0500  Magnesium  Every Mon-Wed-Fri (0500),   R     Question:  Specimen collection method  Answer:  Lab=Lab collect   06/14/22 1118   06/14/22 0905  Urinalysis, Routine w reflex microscopic -Urine, Clean Catch  Once,   R       Question:  Specimen Source  Answer:  Urine, Clean Catch   06/14/22 0905   06/13/22 1846  CBC  Every Mon-Wed-Fri,   R (with TIMED occurrences)     Question:  Specimen collection method  Answer:  Lab=Lab collect   06/13/22 1846             DVT prophylaxis: SCDs Start: 06/06/22 0153   Code Status:   Code Status: Full Code  Family Communication: wife at bedside  Disposition:   Dispo: The patient is  from: Home              Anticipated d/c is to: likely will need snf              Anticipated d/c date is: remain feeding tube dependent, gi consulted for possible EGD  Antimicrobials:     Anti-infectives (From admission, onward)    Start     Dose/Rate Route Frequency Ordered Stop   06/14/22 1200  cefTRIAXone (ROCEPHIN) 2 g in sodium chloride 0.9 % 100 mL IVPB        2 g 200 mL/hr over 30 Minutes Intravenous Every 24 hours 06/14/22 1116 06/14/22 1725   06/12/22 1230  fluconazole (DIFLUCAN) IVPB 200 mg        200 mg 100 mL/hr over 60 Minutes Intravenous Every 24 hours 06/12/22 1134 06/12/22 1434   06/07/22 1200  vancomycin (VANCOCIN) IVPB 1000 mg/200 mL premix  Status:  Discontinued        1,000 mg 200 mL/hr over 60 Minutes Intravenous Every 36 hours 06/05/22 2323 06/06/22 1108   06/06/22 1200  cefTRIAXone  (ROCEPHIN) 2 g in sodium chloride 0.9 % 100 mL IVPB  Status:  Discontinued        2 g 200 mL/hr over 30 Minutes Intravenous Every 24 hours 06/06/22 1110 06/14/22 1116   06/06/22 0200  ceFEPIme (MAXIPIME) 2 g in sodium chloride 0.9 % 100 mL IVPB  Status:  Discontinued        2 g 200 mL/hr over 30 Minutes Intravenous Every 12 hours 06/05/22 2323 06/06/22 1108   06/05/22 2315  vancomycin (VANCOCIN) IVPB 1000 mg/200 mL premix        1,000 mg 200 mL/hr over 60 Minutes Intravenous  Once 06/05/22 2307 06/06/22 0145   06/05/22 1900  cefTRIAXone (ROCEPHIN) 2 g in sodium chloride 0.9 % 100 mL IVPB  Status:  Discontinued        2 g 200 mL/hr over 30 Minutes Intravenous Every 24 hours 06/05/22 1847 06/05/22 2323   06/05/22 1900  azithromycin (ZITHROMAX) 500 mg in sodium chloride 0.9 % 250 mL IVPB  Status:  Discontinued        500 mg 250 mL/hr over 60 Minutes Intravenous Every 24 hours 06/05/22 1847 06/06/22 1108   06/05/22 1830  ceFEPIme (MAXIPIME) 2 g in sodium chloride 0.9 % 100 mL IVPB  Status:  Discontinued        2 g 200 mL/hr over 30 Minutes Intravenous  Once 06/05/22 1820 06/05/22 1847   06/05/22 1830  metroNIDAZOLE (FLAGYL) IVPB 500 mg  Status:  Discontinued        500 mg 100 mL/hr over 60 Minutes Intravenous  Once 06/05/22 1820 06/05/22 1847   06/05/22 1830  vancomycin (VANCOCIN) IVPB 1000 mg/200 mL premix  Status:  Discontinued        1,000 mg 200 mL/hr over 60 Minutes Intravenous  Once 06/05/22 1820 06/05/22 1826   06/05/22 1830  vancomycin (VANCOREADY) IVPB 1500 mg/300 mL  Status:  Discontinued        1,500 mg 150 mL/hr over 120 Minutes Intravenous  Once 06/05/22 1826 06/05/22 1847         Objective: Vitals:   06/14/22 2100 06/15/22 0346 06/15/22 0945 06/15/22 0953  BP: 112/76 111/72 (!) 142/81   Pulse: (!) 105 97  (!) 108  Resp: 20 20    Temp: 97.8 F (36.6 C) 98.1 F (36.7 C)    TempSrc: Oral Oral    SpO2: 100% 97%  Weight:  63.3 kg    Height:        Intake/Output  Summary (Last 24 hours) at 06/15/2022 1156 Last data filed at 06/15/2022 0400 Gross per 24 hour  Intake 1407.5 ml  Output 1500 ml  Net -92.5 ml   Filed Weights   06/13/22 0636 06/14/22 0500 06/15/22 0346  Weight: 67.8 kg 61.3 kg 63.3 kg    Examination:  General exam: weak, but alert, not oriented to time but to person and place, feeding tube in right nare  Respiratory system: Clear to auscultation. Respiratory effort normal. Cardiovascular system:  RRR.  Gastrointestinal system: Abdomen is nondistended, soft and nontender.  Normal bowel sounds heard. Central nervous system: Alert and orientedx2. No focal neurological deficits. Extremities:  no edema Skin: No rashes, lesions or ulcers Psychiatry: calm and cooperative, not a reliable historian due to impaired memory     Data Reviewed: I have personally reviewed  labs and visualized  imaging studies since the last encounter and formulate the plan        Scheduled Meds:  acetaminophen (TYLENOL) oral liquid 160 mg/5 mL  1,000 mg Oral TID   aspirin  81 mg Per Tube Daily   atorvastatin  20 mg Oral Daily   cholestyramine light  4 g Per Tube BID   clopidogrel  75 mg Per Tube Daily   feeding supplement  237 mL Oral TID BM   feeding supplement (PROSource TF20)  60 mL Per Tube Daily   losartan  50 mg Oral Daily   metoprolol tartrate  25 mg Oral BID   mirtazapine  7.5 mg Oral QHS   nystatin  5 mL Oral QID   sucralfate  1 g Oral TID WC & HS   Continuous Infusions:  sodium chloride 10 mL/hr at 06/07/22 0645   feeding supplement (OSMOLITE 1.5 CAL) 1,000 mL (06/15/22 0513)     LOS: 10 days   Time spent:  50mns  FFlorencia Reasons MD PhD FACP [facility]  Available via Epic secure chat 7am-7pm for nonurgent issues Please page for urgent issues To page the attending provider between 7A-7P or the covering provider during after hours 7P-7A, please log into the web site [email or web address] and access using universal [place] password for  that web site. If you do not have the password, please call the hospital operator.    06/15/2022, 11:56 AM

## 2022-06-15 NOTE — Progress Notes (Addendum)
When RN walk in the room, NG feeding tube removed, only less than 10 cm left in nose. Pt denied pullout the tube by himself. RN assessed the pt, lung sound fine crackles. O2 sat 95%, pt denied difficult breathing. RN notified on call and advanced the tube.

## 2022-06-16 ENCOUNTER — Other Ambulatory Visit: Payer: Self-pay

## 2022-06-16 ENCOUNTER — Inpatient Hospital Stay (HOSPITAL_COMMUNITY): Payer: Medicare Other

## 2022-06-16 DIAGNOSIS — K208 Other esophagitis without bleeding: Secondary | ICD-10-CM | POA: Diagnosis not present

## 2022-06-16 DIAGNOSIS — B370 Candidal stomatitis: Secondary | ICD-10-CM | POA: Diagnosis not present

## 2022-06-16 DIAGNOSIS — D701 Agranulocytosis secondary to cancer chemotherapy: Secondary | ICD-10-CM | POA: Diagnosis not present

## 2022-06-16 DIAGNOSIS — A419 Sepsis, unspecified organism: Secondary | ICD-10-CM | POA: Diagnosis not present

## 2022-06-16 LAB — URINALYSIS, ROUTINE W REFLEX MICROSCOPIC
Bacteria, UA: NONE SEEN
Bilirubin Urine: NEGATIVE
Glucose, UA: NEGATIVE mg/dL
Hgb urine dipstick: NEGATIVE
Ketones, ur: NEGATIVE mg/dL
Leukocytes,Ua: NEGATIVE
Nitrite: NEGATIVE
Protein, ur: 30 mg/dL — AB
Specific Gravity, Urine: 1.023 (ref 1.005–1.030)
pH: 5 (ref 5.0–8.0)

## 2022-06-16 LAB — BASIC METABOLIC PANEL
Anion gap: 7 (ref 5–15)
BUN: 31 mg/dL — ABNORMAL HIGH (ref 8–23)
CO2: 23 mmol/L (ref 22–32)
Calcium: 8.4 mg/dL — ABNORMAL LOW (ref 8.9–10.3)
Chloride: 107 mmol/L (ref 98–111)
Creatinine, Ser: 0.52 mg/dL — ABNORMAL LOW (ref 0.61–1.24)
GFR, Estimated: >60 mL/min (ref >60)
Glucose, Bld: 123 mg/dL — ABNORMAL HIGH (ref 70–99)
Potassium: 4.7 mmol/L (ref 3.5–5.1)
Sodium: 137 mmol/L (ref 135–145)

## 2022-06-16 LAB — GLUCOSE, CAPILLARY
Glucose-Capillary: 122 mg/dL — ABNORMAL HIGH (ref 70–99)
Glucose-Capillary: 117 mg/dL — ABNORMAL HIGH (ref 70–99)
Glucose-Capillary: 135 mg/dL — ABNORMAL HIGH (ref 70–99)
Glucose-Capillary: 112 mg/dL — ABNORMAL HIGH (ref 70–99)
Glucose-Capillary: 126 mg/dL — ABNORMAL HIGH (ref 70–99)

## 2022-06-16 NOTE — Progress Notes (Signed)
[facility] Subjective: Patient continues to have problems with odynophagia but cannot verbalize his problems due to disorientation and memory loss. He was on Diflucan for oral candidiasis and ?presumed esophageal candidiasis but this has to be discontinued as it interacts with Plavix, which he is receiving for embolic strokes. He has also received radiation and chemotherapy for lung cancer. 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 His history is complicated by sepsis secondary to pneumonia. He has also had a rectal GIST removed.  Objective: Vital signs in last 24 hours: Temp:  [98 F (36.7 C)-98.3 F (36.8 C)] 98.3 F (36.8 C) (03/02 0436) Pulse Rate:  [92-108] 101 (03/02 0436) Resp:  [18-20] 18 (03/02 0436) BP: (112-142)/(67-81) 133/74 (03/02 0436) SpO2:  [97 %-100 %] 100 % (03/02 0436) Weight:  [60.2 kg] 60.2 kg (03/02 0500) Last BM Date : 06/14/22  Intake/Output from previous day: 03/01 0701 - 03/02 0700 In: 2008.7 [P.O.:240; I.V.:33.7; [id number]; IV Piggyback:100] Out: 2550 [Urine:2550] Intake/Output this shift: No intake/output data recorded.  General appearance: cooperative, appears older than stated age, and cachectic, some what confused, cannot tell the year or month Resp: clear to auscultation bilaterally Cardio: regular rate and rhythm, S1, S2 normal, no murmur, click, rub or gallop GI: soft, non-tender; bowel sounds normal; no masses,  no organomegaly  Lab Results: Recent Labs    06/13/22 1902 06/15/22 1802  WBC 5.0 4.8  HGB 9.5* 8.8*  HCT 28.9* 28.7*  PLT 152 202   BMET Recent Labs    06/14/22 0349 06/15/22 0439 06/16/22 0341  NA 133* 136 137  K 4.5 4.6 4.7  CL 105 105 107  CO2 '24 24 23  '$ GLUCOSE 125* 113* 123*  BUN 28* 32* 31*  CREATININE 0.56* 0.55* 0.52*  CALCIUM 7.8* 8.4* 8.4*   Studies/Results: DG Abd 1 View  Result Date: 06/15/2022 CLINICAL DATA:  NG tube placement EXAM: ABDOMEN - 1 VIEW COMPARISON:  Abdominal x-ray  06/15/2022 FINDINGS: Nasogastric tube tip is in the proximal stomach just beyond the gastroesophageal junction. No dilated bowel loops are seen. IMPRESSION: Nasogastric tube tip is in the proximal stomach just beyond the gastroesophageal junction. Recommend advancing tube. Electronically Signed   By: Ronney Asters M.D.   On: 06/15/2022 15:07   DG Abd 1 View  Result Date: 06/15/2022 CLINICAL DATA:  NG tube placement. EXAM: ABDOMEN - 1 VIEW COMPARISON:  06/14/2022. FINDINGS: The bowel gas pattern is normal. The distal tip of the feeding tube terminates in the anticipated region of the gastric antrum. Mild airspace disease is noted at the right lung base. No radio-opaque calculi or other significant radiographic abnormality are seen. IMPRESSION: Enteric tube terminates in the anticipated region of the gastric antrum. Electronically Signed   By: Brett Fairy M.D.   On: 06/15/2022 04:41   DG Abd 1 View  Result Date: 06/15/2022 CLINICAL DATA:  Nasogastric tube placement EXAM: ABDOMEN - 1 VIEW COMPARISON:  None Available. FINDINGS: Nasoenteric feeding tube tip overlies the gastric fundus. Nonobstructive bowel gas pattern. No free intraperitoneal gas. Pelvis excluded from view. IMPRESSION: 1. Nasoenteric feeding tube tip overlies the gastric fundus. Electronically Signed   By: Fidela Salisbury M.D.   On: 06/15/2022 00:00    Medications: I have reviewed the patient's current medications. Prior to Admission:  Medications Prior to Admission  Medication Sig Dispense Refill Last Dose   acetaminophen (TYLENOL) 500 MG tablet Take 1,000 mg by mouth every 8 (eight) hours as needed for moderate pain.  unk   amLODipine (NORVASC) 5 MG tablet Take 1 tablet (5 mg total) by mouth daily. 90 tablet 3 06/04/2022   atorvastatin (LIPITOR) 40 MG tablet Take 1/2 tablet (20 mg total) by mouth daily. 90 tablet 3 06/04/2022   Cholecalciferol (VITAMIN D3 PO) Take 2,000 Units by mouth daily.   06/04/2022   cholestyramine (QUESTRAN) 4  GM/DOSE powder Take 4 g by mouth 2 (two) times daily.   06/04/2022   diphenoxylate-atropine (LOMOTIL) 2.5-0.025 MG tablet Take 2 tablets by mouth 4 (four) times daily as needed for diarrhea or loose stools.   06/04/2022   ferrous sulfate 325 (65 FE) MG EC tablet Take 1 tablet (325 mg total) by mouth 2 (two) times daily. 60 tablet 1 06/04/2022   imatinib (GLEEVEC) 400 MG tablet Take 1 tablet (400 mg total) by mouth daily. Take with meals and large glass of water.Caution:Chemotherapy. (Patient taking differently: Take 400 mg by mouth at bedtime. Take with meals and large glass of water.Caution:Chemotherapy.) 30 tablet 3 06/04/2022   loperamide (IMODIUM A-D) 2 MG tablet Take 4 mg by mouth 4 (four) times daily as needed for diarrhea or loose stools.   06/05/2022   megestrol (MEGACE ES) 625 MG/5ML suspension TAKE 5 MLS (625 MG TOTAL) BY MOUTH DAILY. 450 mL 1 06/05/2022   mirtazapine (REMERON) 15 MG tablet Take 1 tablet (15 mg total) by mouth at bedtime. 30 tablet 2 06/04/2022   Multiple Vitamins-Minerals (PRESERVISION AREDS 2) CAPS Take 1 capsule by mouth 2 (two) times daily.   06/04/2022   omeprazole (PRILOSEC) 40 MG capsule Take 1 capsule (40 mg total) by mouth daily. 90 capsule 3 06/04/2022   ondansetron (ZOFRAN) 4 MG tablet Take 4 mg by mouth every 8 (eight) hours as needed for nausea or vomiting.   unk   OVER THE COUNTER MEDICATION Take 3 tablets by mouth daily.  Balance of Nature Fruits   Past Week   OVER THE COUNTER MEDICATION Take 3 tablets by mouth daily. Balance of Nature Vegetable   Past Week   polycarbophil (FIBERCON) 625 MG tablet Take 2,500 mg by mouth daily. Taking 4 tabs ( 625 mg each)  daily   06/04/2022   prochlorperazine (COMPAZINE) 10 MG tablet Take 1 tablet (10 mg total) by mouth every 6 (six) hours as needed for nausea or vomiting. 30 tablet 0 Past Week   sucralfate (CARAFATE) 1 g tablet Take 1 tablet by mouth 4 times daily -  with meals and at bedtime. 60 tablet 1 06/04/2022   potassium  chloride SA (KLOR-CON M) 20 MEQ tablet Take 1 tablet (20 mEq total) by mouth daily. 7 tablet 0    Scheduled:  acetaminophen (TYLENOL) oral liquid 160 mg/5 mL  1,000 mg Oral TID   aspirin  81 mg Per Tube Daily   atorvastatin  20 mg Oral Daily   cholestyramine light  4 g Per Tube BID   clopidogrel  75 mg Per Tube Daily   feeding supplement  237 mL Oral TID BM   feeding supplement (PROSource TF20)  60 mL Per Tube Daily   losartan  50 mg Oral Daily   metoprolol tartrate  25 mg Oral BID   mirtazapine  7.5 mg Oral QHS   nystatin  5 mL Oral QID   pantoprazole (PROTONIX) IV  40 mg Intravenous Q24H   sucralfate  1 g Oral TID WC & HS   Continuous:  sodium chloride 10 mL/hr at 06/15/22 2022   feeding supplement (OSMOLITE 1.5 CAL)  1,000 mL (06/15/22 2202)   [id number] chloride, acetaminophen **OR** acetaminophen, alum & mag hydroxide-simeth, HYDROcodone-acetaminophen, ipratropium-albuterol, lidocaine, mouth rinse  Assessment/Plan: 1) Odynophagia/GERD/Feeding difficulties with oral candidiasis, history of radiation for lung cancer-will schedule him for an EGD on 06/18/22. On tube feeds. Minimal oral intake as per staff.  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 3) IDA on supplements. 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 5) History of rectal GIST. 6) CHF. 7) Sever malnutrition.   LOS: 11 days   Juanita Craver 06/16/2022, 7:47 AM

## 2022-06-16 NOTE — Progress Notes (Addendum)
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   oral intake remains very poor,  + oral thrush, reports odynophagia   no fever   Assessment & Plan:  Principal Problem:   Severe sepsis (Jesse [name]) Active Problems:   Dyslipidemia   Essential hypertension   Adenocarcinoma of left lung, stage 2 (HCC)   Malignant neoplasm of lung (HCC)   Hypokalemia   AKI (acute kidney injury) (Jesse [name])   Hyponatremia   Acute metabolic encephalopathy   Acute respiratory failure with hypoxia (Jesse [name])   CAP (community acquired pneumonia)   Thrombocytopenia (Jesse [name])   Sepsis (Jesse [name])   Diarrhea   Bacteremia due to Klebsiella pneumoniae   Pneumonia of right middle lobe due to Klebsiella pneumoniae (Jesse [name])   Drug-induced neutropenia (HCC)   Protein-calorie malnutrition, severe    Assessment and Plan:  Severe sepsis present on admission due to Klebsiella pneumonia bacteremia and pneumonia  per  Last ID note on 2/27, finished total of 10days of  rocephin on 2/29 per ID recommendation   Acute metabolic encephalopathy: Unresponsive /confused on  presentation.   He has improved , fully alert, oriented to person and place but not oriented to time , not a reliable historian, wife reports this is not his baseline  Acute CVAs -Incidental finding on MRI of the brain  for cancer staging -Seen by neurology thought to be embolic, Patient was started on aspirin and Plavix. 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  Speech therapy recommended dysphagia 3 diet.  On Remeron.  Dietitian following. Received one dose diflucan on 2/27, appear was d/c per ID, has been on Topical nystatin  Persistent poor oral intake, oral thrush slightly improved, c/o odynophagia, will ask GI to see for possible EGD, as candida esophagitis/radiation esophagitis are on differential  GI recommend trial of Diflucan 7 to 10 days, EGD if no improvement of symptoms, however there is interaction between Diflucan and Plavix ( and other antiplatelet agent or Eliquis), on 2/27 urology and ID decided not to treat with Diflucan, case discussed with GI Jesse [name] on Saturday who decided to schedule EGD on Monday to get diagnosis, hold Diflucan for now, continue topical nystatin    Acute on chronic diarrhea: History of anterior resection syndrome.  C. difficile positive but toxin was negative.  GI pathogen panel negative.  On  Questran, On contact precaution.  Diarrhea has stopped,  last bm documented was brown , mushy with ragged edges on 3/1  AKI Resolved   hypertension: Initially hypotensive but now blood pressure has trended up.  resumed losartan, metoprolol , titrate bp prn   Pancytopenia: Given filgrastim x 3.  Oncology was following.   Hemoglobin in the range of 9, platelets in the range of 100.  No evidence of acute blood loss.  Status post a unit of PRBC transfusion on 2/25.  Currently hemoglobin stable  Recurrent adenocarcinoma of the lung -Initially diagnosed in June 2021, status post left upper lobectomy with lymph node dissection on December 16, 2019  -repeat video bronchoscopy with EBUS on April 11, 2022 and biopsy from the station 7 lymph node showed recurrent adenocarcinoma.  -The patient is currently undergoing a course of concurrent chemoradiation with weekly carboplatin for AUC of 2 and paclitaxel 45 Mg/M2. First dose May 14, 2022.  -Follows radiation oncology and medical oncology Dr. Julien [name]   GIST tumor of rectum -s/p Neoadjuvant treatment with imatinib 400 mg p.o. daily for the GIST tumor of the pelvic area. First dose started July 18, 2020. Status post 12 months of treatment. -s/p  robotic assisted very low anterior rectosigmoid resection with coloanal anastomosis, diverting loop ileostomy and wedge liver biopsy under the care of Dr. Johney [name] on December 14, 2021 and it showed minimal residual gastrointestinal stromal tumor.  -s/p undergoing ileostomy takedown on 03/22/2022  -home meds imatinib held F/u oncology Dr. Julien [name]   Deconditioning/weakness: PT/OT recommended SNF on discharge.  TOC following   Goals of care: Patient with history of lung cancer now with very poor oral intake, severe weakness.  Poor quality of life.  On tube feeding.  We requested palliative care consultation after discussion with wife,remains full code    Nutritional Assessment: The patient's BMI is: Body mass index is 19.6 kg/m.Marland Kitchen Seen by dietician.  I agree with the assessment and plan as outlined below: Nutrition Status: Nutrition Problem: Severe Malnutrition Etiology: chronic illness (lung cancer currently undergoing chemotherapy and radiation therapy) Signs/Symptoms: severe fat depletion, severe muscle depletion, percent  weight loss (15% in 6 months) Percent weight loss: 15 % (in 6 months) Interventions: Ensure Enlive (each supplement provides 350kcal and 20 grams of protein), Tube feeding  .      I have Reviewed nursing notes, Vitals, pain scores, I/o's, Lab results and  imaging results since pt's last encounter, details please see discussion above  I ordered the following labs:  Unresulted Labs (From admission, onward)     Start     Ordered   06/18/22 0500  Phosphorus  Every Monday (0500),   R     Question:  Specimen collection method  Answer:  Lab=Lab collect   06/14/22 1118   06/15/22 [id number]  Basic metabolic panel  Daily,   R     Question:  Specimen collection method  Answer:  Lab=Lab collect   06/14/22 1118   06/15/22 0500  Magnesium  Every Mon-Wed-Fri (0500),   R     Question:  Specimen collection method  Answer:  Lab=Lab collect   06/14/22 1118   06/13/22 1846  CBC  Every Mon-Wed-Fri,   R     Question:  Specimen collection method  Answer:  Lab=Lab collect   06/13/22 1846             DVT prophylaxis: SCDs Start: 06/06/22 0153   Code Status:   Code Status: Full Code  Family Communication: wife at bedside on 3/1 Disposition:   Dispo:  The patient is from: Home              Anticipated d/c is to: snf once able to eat, he does not want permanent feeding tube              Anticipated d/c date is: EGD on Monday, remain tube feeds dependent  Antimicrobials:     Anti-infectives (From admission, onward)    Start     Dose/Rate Route Frequency Ordered Stop   06/15/22 2000  fluconazole (DIFLUCAN) IVPB 200 mg        200 mg 100 mL/hr over 60 Minutes Intravenous Every 24 hours 06/15/22 1720 06/22/22 1959   06/14/22 1200  cefTRIAXone (ROCEPHIN) 2 g in sodium chloride 0.9 % 100 mL IVPB        2 g 200 mL/hr over 30 Minutes Intravenous Every 24 hours 06/14/22 1116 06/14/22 1725   06/12/22 1230  fluconazole (DIFLUCAN) IVPB 200 mg        200 mg 100 mL/hr over 60 Minutes Intravenous Every 24  hours 06/12/22 1134 06/12/22 1434   06/07/22 1200  vancomycin (VANCOCIN) IVPB 1000 mg/200 mL premix  Status:  Discontinued        1,000 mg 200 mL/hr over 60 Minutes Intravenous Every 36 hours 06/05/22 2323 06/06/22 1108   06/06/22 1200  cefTRIAXone (ROCEPHIN) 2 g in sodium chloride 0.9 % 100 mL IVPB  Status:  Discontinued        2 g 200 mL/hr over 30 Minutes Intravenous Every 24 hours 06/06/22 1110 06/14/22 1116   06/06/22 0200  ceFEPIme (MAXIPIME) 2 g in sodium chloride 0.9 % 100 mL IVPB  Status:  Discontinued        2 g 200 mL/hr over 30 Minutes Intravenous Every 12 hours 06/05/22 2323 06/06/22 1108   06/05/22 2315  vancomycin (VANCOCIN) IVPB 1000 mg/200 mL premix        1,000 mg 200 mL/hr over 60 Minutes Intravenous  Once 06/05/22 2307 06/06/22 0145   06/05/22 1900  cefTRIAXone (ROCEPHIN) 2 g in sodium chloride 0.9 % 100 mL IVPB  Status:  Discontinued        2 g 200 mL/hr over 30 Minutes Intravenous Every 24 hours 06/05/22 1847 06/05/22 2323   06/05/22 1900  azithromycin (ZITHROMAX) 500 mg in sodium chloride 0.9 % 250 mL IVPB  Status:  Discontinued        500 mg 250 mL/hr over 60 Minutes Intravenous Every 24 hours 06/05/22 1847 06/06/22 1108   06/05/22 1830  ceFEPIme (MAXIPIME) 2 g in sodium chloride 0.9 % 100 mL IVPB  Status:  Discontinued        2 g 200 mL/hr over 30 Minutes Intravenous  Once 06/05/22 1820 06/05/22 1847   06/05/22 1830  metroNIDAZOLE (FLAGYL) IVPB 500 mg  Status:  Discontinued        500 mg 100 mL/hr over 60 Minutes Intravenous  Once 06/05/22 1820 06/05/22 1847   06/05/22 1830  vancomycin (VANCOCIN) IVPB 1000 mg/200 mL premix  Status:  Discontinued        1,000 mg 200 mL/hr over 60 Minutes Intravenous  Once 06/05/22 1820 06/05/22 1826   06/05/22 1830  vancomycin (VANCOREADY) IVPB 1500 mg/300 mL  Status:  Discontinued        1,500 mg 150 mL/hr over 120 Minutes Intravenous  Once 06/05/22 1826 06/05/22 1847         Objective: Vitals:   06/15/22 1209 06/15/22  2029 06/16/22 0436 06/16/22 0500  BP: 128/75 112/67 133/74   Pulse: 92 (!) 108 (!) 101   Resp: '20 18 18   '$ Temp: 98.1 F (36.7 C) 98 F (36.7 C) 98.3 F (36.8 C)   TempSrc: Oral Oral Oral   SpO2: 98% 97% 100%   Weight:    60.2 kg  Height:        Intake/Output Summary (Last 24 hours) at 06/16/2022 0810 Last data filed at 06/16/2022 [id number] Gross per 24 hour  Intake 1978.69 ml  Output 2550 ml  Net -571.31 ml   Filed Weights   06/14/22 0500 06/15/22 0346 06/16/22 0500  Weight: 61.3 kg 63.3 kg 60.2 kg    Examination:  General exam: weak, but alert, not oriented to time but to person and place, feeding tube in right nare  Respiratory system: Clear to auscultation. Respiratory effort normal. Cardiovascular system:  RRR.  Gastrointestinal system: Abdomen is nondistended, soft and nontender.  Normal bowel sounds heard. Central nervous system: Alert and orientedx2. No focal neurological deficits. Extremities:  no edema Skin: No rashes, lesions or ulcers Psychiatry: calm and cooperative, not a reliable historian due to impaired memory     Data Reviewed: I have personally reviewed  labs and visualized  imaging studies since the last encounter and formulate the plan        Scheduled Meds:  acetaminophen (TYLENOL) oral liquid 160 mg/5 mL  1,000 mg Oral TID   aspirin  81 mg Per Tube Daily   atorvastatin  20 mg Oral Daily   cholestyramine light  4 g Per Tube BID   clopidogrel  75 mg Per Tube Daily   feeding supplement  237 mL Oral TID BM   feeding supplement (PROSource TF20)  60 mL Per Tube Daily   losartan  50 mg Oral Daily   metoprolol tartrate  25 mg Oral BID   mirtazapine  7.5 mg Oral QHS   nystatin  5 mL Oral QID   pantoprazole (PROTONIX) IV  40 mg Intravenous Q24H   sucralfate  1 g Oral TID WC & HS   Continuous Infusions:  sodium chloride 10 mL/hr at 06/15/22 2022   feeding supplement (OSMOLITE 1.5 CAL) 1,000 mL (06/15/22 2202)   fluconazole (DIFLUCAN) IV 200 mg  (06/15/22 2025)     LOS: 11 days   Time spent:  45mns  FFlorencia Reasons MD PhD FACP [facility]  Available via Epic secure chat 7am-7pm for nonurgent issues Please page for urgent issues To page the attending provider between 7A-7P or the covering provider during after hours 7P-7A, please log into the web site [email or web address] and access using universal [place] password for that web site. If you do not have the password, please call the hospital operator.    06/16/2022, 8:10 AM

## 2022-06-17 DIAGNOSIS — K208 Other esophagitis without bleeding: Secondary | ICD-10-CM | POA: Diagnosis not present

## 2022-06-17 DIAGNOSIS — B370 Candidal stomatitis: Secondary | ICD-10-CM | POA: Diagnosis not present

## 2022-06-17 DIAGNOSIS — D701 Agranulocytosis secondary to cancer chemotherapy: Secondary | ICD-10-CM | POA: Diagnosis not present

## 2022-06-17 DIAGNOSIS — A419 Sepsis, unspecified organism: Secondary | ICD-10-CM | POA: Diagnosis not present

## 2022-06-17 LAB — BASIC METABOLIC PANEL
Anion gap: 4 — ABNORMAL LOW (ref 5–15)
BUN: 33 mg/dL — ABNORMAL HIGH (ref 8–23)
CO2: 23 mmol/L (ref 22–32)
Calcium: 8.3 mg/dL — ABNORMAL LOW (ref 8.9–10.3)
Chloride: 107 mmol/L (ref 98–111)
Creatinine, Ser: 0.41 mg/dL — ABNORMAL LOW (ref 0.61–1.24)
GFR, Estimated: >60 mL/min (ref >60)
Glucose, Bld: 134 mg/dL — ABNORMAL HIGH (ref 70–99)
Potassium: 4.5 mmol/L (ref 3.5–5.1)
Sodium: 134 mmol/L — ABNORMAL LOW (ref 135–145)

## 2022-06-17 LAB — GLUCOSE, CAPILLARY
Glucose-Capillary: 112 mg/dL — ABNORMAL HIGH (ref 70–99)
Glucose-Capillary: 115 mg/dL — ABNORMAL HIGH (ref 70–99)
Glucose-Capillary: 132 mg/dL — ABNORMAL HIGH (ref 70–99)
Glucose-Capillary: 138 mg/dL — ABNORMAL HIGH (ref 70–99)
Glucose-Capillary: 142 mg/dL — ABNORMAL HIGH (ref 70–99)
Glucose-Capillary: 144 mg/dL — ABNORMAL HIGH (ref 70–99)

## 2022-06-17 MED ORDER — ZINC OXIDE 40 % EX OINT
TOPICAL_OINTMENT | CUTANEOUS | Status: DC | PRN
  Filled 2022-06-17 (×4): qty 57

## 2022-06-17 NOTE — Progress Notes (Signed)
Patient's Dobhoff found to be pulled out to the 45 cm mark at right nare, normal placement was at 80 cm. Tube feeding stopped. Notified on call provider to advance Dobhoff tube back to the 80 cm mark and requested an order for Xray to confirm placement. This RN advanced Dobhoff tube to the 80 cm mark. Xray obtained and confirmed tube in the stomach. Tube feeding resumed. Mitts placed on patient. Will continue to monitor.

## 2022-06-17 NOTE — Progress Notes (Signed)
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    oral thrush has much improved, continue to reports odynophagia , reports oral intake might have slightly improved today per wife   no fever, no diarrhea, no ab pain, no n/v  Wife at bedside    Assessment & Plan:  Principal Problem:   Severe sepsis ([place]) Active Problems:   Dyslipidemia   Essential hypertension   Adenocarcinoma of left lung, stage 2 (HCC)   Malignant neoplasm of lung (HCC)   Hypokalemia   AKI (acute kidney injury) ([place])   Hyponatremia   Acute metabolic encephalopathy   Acute respiratory failure with hypoxia ([place])   CAP (community acquired pneumonia)   Thrombocytopenia ([place])   Sepsis ([place])   Diarrhea   Bacteremia due to Klebsiella pneumoniae   Pneumonia of right middle lobe due to Klebsiella pneumoniae ([place])   Drug-induced neutropenia ([place])   Protein-calorie malnutrition, severe    Assessment and Plan:  Severe sepsis present on admission due to Klebsiella pneumonia bacteremia and pneumonia  per  Last ID note on 2/27, finished total of 10days of  rocephin on 2/29 per ID recommendation   Acute metabolic encephalopathy: Unresponsive /confused on  presentation.   He  has improved , fully alert, oriented to person and place but not oriented to time , not a reliable historian, wife reports this is not his baseline  Acute CVAs -Incidental finding on MRI of the brain for cancer staging -Seen by neurology thought to be embolic, Patient was started on aspirin and Plavix. 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  Cardiology recommend 2wks zio patch after discharge, currently on tele  Cardiomyopathy, LVEF 40 to 45% -Notified echocardiogram and send neurology workup -Seen by cardiology, no plan for ischemic workup, continue metoprolol, as needed Lasix, currently appear euvolemic to dry     Odynophagia /dysphagia/oral thrush/anorexia:  -Persistent poor oral intake, oral thrush has much improved improved, continue to have  odynophagia - plan for  EGD on 3/4  as candida esophagitis/radiation esophagitis are on differential  -  Received one dose diflucan on 2/27, was d/ced per ID and neurology on 2/27 due to  interaction between Diflucan and Plavix ( and other antiplatelet agent or Eliquis),  - hold Diflucan for now, continue topical nystatin, Carafate - f/u on EGD result    Acute on chronic diarrhea: History of anterior resection syndrome.   -C. difficile positive but toxin was negative.  GI pathogen panel negative.   -On  Questran, On contact precaution.   Diarrhea has stopped.  AKI Resolved   hypertension:  Initially hypotensive but now blood pressure has trended up.  resumed losartan, metoprolol , titrate bp prn  Pancytopenia in the setting of malignancy Neutropenia ,given filgrastim x 3.  WBC normalized  Thrombocytopenia , resolved  Normocytic anemia , hemoglobin above 8 , FOBT+  on 2/21, with h/o GIST tumor of rectum   Status post a unit of  PRBC transfusion on 2/25.   Recurrent adenocarcinoma of the lung -Initially diagnosed in June 2021, status post left upper lobectomy with lymph node dissection on December 16, 2019  -repeat video bronchoscopy with EBUS on April 11, 2022 and biopsy from the station 7 lymph node showed recurrent adenocarcinoma.  -The patient is currently undergoing a course of concurrent chemoradiation with weekly carboplatin for AUC of 2 and paclitaxel 45 Mg/M2. First dose May 14, 2022.  -Follows radiation oncology and medical oncology Dr. Julien Nordmann   GIST tumor of rectum -s/p Neoadjuvant treatment with imatinib 400 mg p.o. daily for the GIST tumor of the pelvic area. First dose started July 18, 2020. Status post 12 months of treatment. -s/p  robotic assisted very low anterior rectosigmoid resection with coloanal anastomosis, diverting loop ileostomy and wedge liver biopsy under the care of Dr. Johney Maine on December 14, 2021 and it showed minimal residual gastrointestinal stromal tumor.  -s/p undergoing ileostomy takedown on 03/22/2022  -home meds imatinib held F/u oncology Dr. Julien Nordmann   Deconditioning/weakness: PT/OT recommended SNF on discharge.   Goals of care: Patient with history of lung cancer now with very poor oral intake, severe weakness.  Poor quality of life.  On tube feeding.  We requested palliative care consultation after discussion with wife,remains full code    Nutritional Assessment: The patient's BMI is: Body mass index is 19.37 kg/m.Marland Kitchen Seen by dietician.  I agree with the assessment and plan as outlined below: Nutrition Status: Nutrition Problem: Severe Malnutrition Etiology: chronic illness (lung cancer currently undergoing chemotherapy and radiation therapy) Signs/Symptoms: severe fat depletion, severe muscle depletion, percent weight loss (15% in 6 months) Percent weight loss: 15 % (in 6 months) Interventions: Ensure Enlive (each supplement provides 350kcal and 20 grams of protein),  Tube feeding  .      I have Reviewed nursing notes, Vitals, pain scores, I/o's, Lab results and  imaging results since pt's last encounter, details please see discussion above  I ordered the following labs:  Unresulted Labs (From admission, onward)     Start     Ordered   06/18/22 0500  Phosphorus  Every Monday (0500),   R     Question:  Specimen collection method  Answer:  Lab=Lab collect   06/14/22 1118   06/18/22 [id number]  Basic metabolic panel  Every Mon-Wed-Fri (0500),   R     Question:  Specimen collection method  Answer:  Lab=Lab collect   06/17/22 0812   06/15/22 0500  Magnesium  Every Mon-Wed-Fri (0500),   R     Question:  Specimen collection method  Answer:  Lab=Lab collect   06/14/22 1118   06/13/22 1846  CBC  Every Mon-Wed-Fri,   R     Question:  Specimen collection method  Answer:  Lab=Lab collect   06/13/22 1846             DVT prophylaxis: SCDs Start: 06/06/22 0153   Code Status:   Code Status: Full Code  Family Communication: wife at bedside on 3/1 and 3/3 Disposition:   Dispo: The patient is from: Home  Anticipated d/c is to: snf once able to eat, he does not want permanent feeding tube              Anticipated d/c date is: EGD on Monday, remains tube feeds dependent  Antimicrobials:     Anti-infectives (From admission, onward)    Start     Dose/Rate Route Frequency Ordered Stop   06/15/22 2000  fluconazole (DIFLUCAN) IVPB 200 mg  Status:  Discontinued        200 mg 100 mL/hr over 60 Minutes Intravenous Every 24 hours 06/15/22 1720 06/16/22 1501   06/14/22 1200  cefTRIAXone (ROCEPHIN) 2 g in sodium chloride 0.9 % 100 mL IVPB        2 g 200 mL/hr over 30 Minutes Intravenous Every 24 hours 06/14/22 1116 06/14/22 1725   06/12/22 1230  fluconazole (DIFLUCAN) IVPB 200 mg        200 mg 100 mL/hr over 60 Minutes Intravenous Every 24 hours 06/12/22 1134 06/12/22 1434   06/07/22 1200  vancomycin (VANCOCIN) IVPB 1000 mg/200 mL premix  Status:   Discontinued        1,000 mg 200 mL/hr over 60 Minutes Intravenous Every 36 hours 06/05/22 2323 06/06/22 1108   06/06/22 1200  cefTRIAXone (ROCEPHIN) 2 g in sodium chloride 0.9 % 100 mL IVPB  Status:  Discontinued        2 g 200 mL/hr over 30 Minutes Intravenous Every 24 hours 06/06/22 1110 06/14/22 1116   06/06/22 0200  ceFEPIme (MAXIPIME) 2 g in sodium chloride 0.9 % 100 mL IVPB  Status:  Discontinued        2 g 200 mL/hr over 30 Minutes Intravenous Every 12 hours 06/05/22 2323 06/06/22 1108   06/05/22 2315  vancomycin (VANCOCIN) IVPB 1000 mg/200 mL premix        1,000 mg 200 mL/hr over 60 Minutes Intravenous  Once 06/05/22 2307 06/06/22 0145   06/05/22 1900  cefTRIAXone (ROCEPHIN) 2 g in sodium chloride 0.9 % 100 mL IVPB  Status:  Discontinued        2 g 200 mL/hr over 30 Minutes Intravenous Every 24 hours 06/05/22 1847 06/05/22 2323   06/05/22 1900  azithromycin (ZITHROMAX) 500 mg in sodium chloride 0.9 % 250 mL IVPB  Status:  Discontinued        500 mg 250 mL/hr over 60 Minutes Intravenous Every 24 hours 06/05/22 1847 06/06/22 1108   06/05/22 1830  ceFEPIme (MAXIPIME) 2 g in sodium chloride 0.9 % 100 mL IVPB  Status:  Discontinued        2 g 200 mL/hr over 30 Minutes Intravenous  Once 06/05/22 1820 06/05/22 1847   06/05/22 1830  metroNIDAZOLE (FLAGYL) IVPB 500 mg  Status:  Discontinued        500 mg 100 mL/hr over 60 Minutes Intravenous  Once 06/05/22 1820 06/05/22 1847   06/05/22 1830  vancomycin (VANCOCIN) IVPB 1000 mg/200 mL premix  Status:  Discontinued        1,000 mg 200 mL/hr over 60 Minutes Intravenous  Once 06/05/22 1820 06/05/22 1826   06/05/22 1830  vancomycin (VANCOREADY) IVPB 1500 mg/300 mL  Status:  Discontinued        1,500 mg 150 mL/hr over 120 Minutes Intravenous  Once 06/05/22 1826 06/05/22 1847         Objective: Vitals:   06/16/22 1339 06/16/22 2040 06/17/22 0409 06/17/22 1223  BP: 112/78 121/86 120/70 102/65  Pulse: (!) 110 (!) 109 96 (!) 104  Resp:  '16  18 20  '$ Temp: 97.8 F (36.6 C) 98.2 F (36.8 C) 98.2 F (36.8 C) 97.6 F (36.4 C)  TempSrc:  Oral Oral Oral  SpO2: 100% 99% 100% 100%  Weight:   59.5 kg   Height:        Intake/Output Summary (Last 24 hours) at 06/17/2022 1629 Last data filed at 06/17/2022 1530 Gross per 24 hour  Intake 1020 ml  Output 2350 ml  Net -1330 ml   Filed Weights   06/15/22 0346 06/16/22 0500 06/17/22 0409  Weight: 63.3 kg 60.2 kg 59.5 kg    Examination:  General exam:  alert, not oriented to time but to person and place, feeding tube in right nare  Respiratory system: Clear to auscultation. Respiratory effort normal. Cardiovascular system:  RRR.  Gastrointestinal system: Abdomen is nondistended, soft and nontender.  Normal bowel sounds heard. Central nervous system: Alert and orientedx2. No focal neurological deficits. Extremities:  no edema Skin: No rashes, lesions or ulcers Psychiatry: calm and cooperative, not a reliable historian due to impaired memory     Data Reviewed: I have personally reviewed  labs and visualized  imaging studies since the last encounter and formulate the plan        Scheduled Meds:  acetaminophen (TYLENOL) oral liquid 160 mg/5 mL  1,000 mg Oral TID   aspirin  81 mg Per Tube Daily   atorvastatin  20 mg Oral Daily   cholestyramine light  4 g Per Tube BID   clopidogrel  75 mg Per Tube Daily   feeding supplement  237 mL Oral TID BM   feeding supplement (PROSource TF20)  60 mL Per Tube Daily   losartan  50 mg Oral Daily   metoprolol tartrate  25 mg Oral BID   mirtazapine  7.5 mg Oral QHS   nystatin  5 mL Oral QID   pantoprazole (PROTONIX) IV  40 mg Intravenous Q24H   sucralfate  1 g Oral TID WC & HS   Continuous Infusions:  sodium chloride 10 mL/hr at 06/15/22 2022   feeding supplement (OSMOLITE 1.5 CAL) 60 mL/hr at 06/17/22 0500     LOS: 12 days   Time spent:  58mns  FFlorencia Reasons MD PhD FACP [facility]  Available via Epic secure chat 7am-7pm  for nonurgent issues Please page for urgent issues To page the attending provider between 7A-7P or the covering provider during after hours 7P-7A, please log into the web site [email or web address] and access using universal [place] password for that web site. If you do not have the password, please call the hospital operator.    06/17/2022, 4:29 PM

## 2022-06-17 NOTE — H&P (View-Only) (Signed)
[facility] Subjective: Patient is a 72 year old white male with multiple medical problems including lung cancer status posttreatment with radiation and chemotherapy; last received radiation on 06/15/2022. He is also had some oral candidiasis but his Diflucan has been held as it interacts with Plavix which has been given to him for recent embolic strokes. He continues to have problems with a dysphagia and odynophagia with poor oral intake and weight loss. He is receiving tube feeds.  His Dobbhoff was dislodged yesterday which has been repositioned today.  Patient has been confused and disoriented but seems more alert today as per the nursing staff  Objective: Vital signs in last 24 hours: Temp:  [97.8 F (36.6 C)-98.2 F (36.8 C)] 98.2 F (36.8 C) (03/03 0409) Pulse Rate:  [96-110] 96 (03/03 0409) Resp:  [16-18] 18 (03/03 0409) BP: (112-121)/(70-86) 120/70 (03/03 0409) SpO2:  [99 %-100 %] 100 % (03/03 0409) Weight:  [59.5 kg] 59.5 kg (03/03 0409) Last BM Date : 06/16/22  Intake/Output from previous day: 03/02 0701 - 03/03 0700 In: 900 [P.O.:150; NG/GT:750] Out: 1700 [Urine:1700] Intake/Output this shift: No intake/output data recorded.  General appearance: cooperative, appears stated age, fatigued, no distress, and pale Resp: clear to auscultation bilaterally Cardio: regular rate and rhythm, S1, S2 normal, no murmur, click, rub or gallop GI: soft, non-tender; bowel sounds normal; no masses,  no organomegaly  Lab Results: Recent Labs    06/15/22 1802  WBC 4.8  HGB 8.8*  HCT 28.7*  PLT 202   BMET Recent Labs    06/15/22 0439 06/16/22 0341 06/17/22 0701  NA 136 137 134*  K 4.6 4.7 4.5  CL 105 107 107  CO2 '24 23 23  '$ GLUCOSE 113* 123* 134*  BUN 32* 31* 33*  CREATININE 0.55* 0.52* 0.41*  CALCIUM 8.4* 8.4* 8.3*   LFT No results for input(s): "PROT", "ALBUMIN", "AST", "ALT", "ALKPHOS", "BILITOT", "BILIDIR", "IBILI" in the last 72 hours. PT/INR No results for  input(s): "LABPROT", "INR" in the last 72 hours. Hepatitis Panel No results for input(s): "HEPBSAG", "HCVAB", "HEPAIGM", "HEPBIGM" in the last 72 hours. C-Diff No results for input(s): "CDIFFTOX" in the last 72 hours. No results for input(s): "CDIFFPCR" in the last 72 hours. Fecal Lactopherrin No results for input(s): "FECLLACTOFRN" in the last 72 hours.  Studies/Results: DG Abd 1 View  Result Date: 06/16/2022 CLINICAL DATA:  Check gastric catheter placement EXAM: ABDOMEN - 1 VIEW COMPARISON:  06/15/2022 FINDINGS: Weighted feeding catheter is noted coiled within the stomach. No free air is seen. No obstructive changes are noted. IMPRESSION: Weighted feeding catheter in the stomach. Electronically Signed   By: Inez Catalina M.D.   On: 06/16/2022 23:01   DG Abd 1 View  Result Date: 06/15/2022 CLINICAL DATA:  NG tube placement EXAM: ABDOMEN - 1 VIEW COMPARISON:  Abdominal x-ray 06/15/2022 FINDINGS: Nasogastric tube tip is in the proximal stomach just beyond the gastroesophageal junction. No dilated bowel loops are seen. IMPRESSION: Nasogastric tube tip is in the proximal stomach just beyond the gastroesophageal junction. Recommend advancing tube. Electronically Signed   By: Ronney Asters M.D.   On: 06/15/2022 15:07    Medications: I have reviewed the patient's current medications. Prior to Admission:  Medications Prior to Admission  Medication Sig Dispense Refill Last Dose   acetaminophen (TYLENOL) 500 MG tablet Take 1,000 mg by mouth every 8 (eight) hours as needed for moderate pain.   unk   amLODipine (NORVASC) 5 MG tablet Take 1 tablet (5 mg total) by mouth  daily. 90 tablet 3 06/04/2022   atorvastatin (LIPITOR) 40 MG tablet Take 1/2 tablet (20 mg total) by mouth daily. 90 tablet 3 06/04/2022   Cholecalciferol (VITAMIN D3 PO) Take 2,000 Units by mouth daily.   06/04/2022   cholestyramine (QUESTRAN) 4 GM/DOSE powder Take 4 g by mouth 2 (two) times daily.   06/04/2022   diphenoxylate-atropine  (LOMOTIL) 2.5-0.025 MG tablet Take 2 tablets by mouth 4 (four) times daily as needed for diarrhea or loose stools.   06/04/2022   ferrous sulfate 325 (65 FE) MG EC tablet Take 1 tablet (325 mg total) by mouth 2 (two) times daily. 60 tablet 1 06/04/2022   imatinib (GLEEVEC) 400 MG tablet Take 1 tablet (400 mg total) by mouth daily. Take with meals and large glass of water.Caution:Chemotherapy. (Patient taking differently: Take 400 mg by mouth at bedtime. Take with meals and large glass of water.Caution:Chemotherapy.) 30 tablet 3 06/04/2022   loperamide (IMODIUM A-D) 2 MG tablet Take 4 mg by mouth 4 (four) times daily as needed for diarrhea or loose stools.   06/05/2022   megestrol (MEGACE ES) 625 MG/5ML suspension TAKE 5 MLS (625 MG TOTAL) BY MOUTH DAILY. 450 mL 1 06/05/2022   mirtazapine (REMERON) 15 MG tablet Take 1 tablet (15 mg total) by mouth at bedtime. 30 tablet 2 06/04/2022   Multiple Vitamins-Minerals (PRESERVISION AREDS 2) CAPS Take 1 capsule by mouth 2 (two) times daily.   06/04/2022   omeprazole (PRILOSEC) 40 MG capsule Take 1 capsule (40 mg total) by mouth daily. 90 capsule 3 06/04/2022   ondansetron (ZOFRAN) 4 MG tablet Take 4 mg by mouth every 8 (eight) hours as needed for nausea or vomiting.   unk   OVER THE COUNTER MEDICATION Take 3 tablets by mouth daily.  Balance of Nature Fruits   Past Week   OVER THE COUNTER MEDICATION Take 3 tablets by mouth daily. Balance of Nature Vegetable   Past Week   polycarbophil (FIBERCON) 625 MG tablet Take 2,500 mg by mouth daily. Taking 4 tabs ( 625 mg each)  daily   06/04/2022   prochlorperazine (COMPAZINE) 10 MG tablet Take 1 tablet (10 mg total) by mouth every 6 (six) hours as needed for nausea or vomiting. 30 tablet 0 Past Week   sucralfate (CARAFATE) 1 g tablet Take 1 tablet by mouth 4 times daily -  with meals and at bedtime. 60 tablet 1 06/04/2022   potassium chloride SA (KLOR-CON M) 20 MEQ tablet Take 1 tablet (20 mEq total) by mouth daily. 7 tablet 0     Scheduled:  acetaminophen (TYLENOL) oral liquid 160 mg/5 mL  1,000 mg Oral TID   aspirin  81 mg Per Tube Daily   atorvastatin  20 mg Oral Daily   cholestyramine light  4 g Per Tube BID   clopidogrel  75 mg Per Tube Daily   feeding supplement  237 mL Oral TID BM   feeding supplement (PROSource TF20)  60 mL Per Tube Daily   losartan  50 mg Oral Daily   metoprolol tartrate  25 mg Oral BID   mirtazapine  7.5 mg Oral QHS   nystatin  5 mL Oral QID   pantoprazole (PROTONIX) IV  40 mg Intravenous Q24H   sucralfate  1 g Oral TID WC & HS   Continuous:  sodium chloride 10 mL/hr at 06/15/22 2022   feeding supplement (OSMOLITE 1.5 CAL) 60 mL/hr at 06/17/22 0500    Assessment/Plan: 1) Odynophagia/GERD/Feeding difficulties with oral candidiasis- Esophageal  candidiasis versus a radiation stricture-history of radiation for lung cancer-he is scheduled for an EGD tomorrow.  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 3) IDA on supplements. 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 5) History of rectal GIST. 6) CHF. 7) Sever malnutrition.   LOS: 12 days   Juanita Craver 06/17/2022, 8:59 AM

## 2022-06-17 NOTE — Progress Notes (Signed)
[facility] Subjective: Patient is a 72 year old white male with multiple medical problems including lung cancer status posttreatment with radiation and chemotherapy; last received radiation on 06/15/2022. He is also had some oral candidiasis but his Diflucan has been held as it interacts with Plavix which has been given to him for recent embolic strokes. He continues to have problems with a dysphagia and odynophagia with poor oral intake and weight loss. He is receiving tube feeds.  His Dobbhoff was dislodged yesterday which has been repositioned today.  Patient has been confused and disoriented but seems more alert today as per the nursing staff  Objective: Vital signs in last 24 hours: Temp:  [97.8 F (36.6 C)-98.2 F (36.8 C)] 98.2 F (36.8 C) (03/03 0409) Pulse Rate:  [96-110] 96 (03/03 0409) Resp:  [16-18] 18 (03/03 0409) BP: (112-121)/(70-86) 120/70 (03/03 0409) SpO2:  [99 %-100 %] 100 % (03/03 0409) Weight:  [59.5 kg] 59.5 kg (03/03 0409) Last BM Date : 06/16/22  Intake/Output from previous day: 03/02 0701 - 03/03 0700 In: 900 [P.O.:150; NG/GT:750] Out: 1700 [Urine:1700] Intake/Output this shift: No intake/output data recorded.  General appearance: cooperative, appears stated age, fatigued, no distress, and pale Resp: clear to auscultation bilaterally Cardio: regular rate and rhythm, S1, S2 normal, no murmur, click, rub or gallop GI: soft, non-tender; bowel sounds normal; no masses,  no organomegaly  Lab Results: Recent Labs    06/15/22 1802  WBC 4.8  HGB 8.8*  HCT 28.7*  PLT 202   BMET Recent Labs    06/15/22 0439 06/16/22 0341 06/17/22 0701  NA 136 137 134*  K 4.6 4.7 4.5  CL 105 107 107  CO2 '24 23 23  '$ GLUCOSE 113* 123* 134*  BUN 32* 31* 33*  CREATININE 0.55* 0.52* 0.41*  CALCIUM 8.4* 8.4* 8.3*   LFT No results for input(s): "PROT", "ALBUMIN", "AST", "ALT", "ALKPHOS", "BILITOT", "BILIDIR", "IBILI" in the last 72 hours. PT/INR No results for  input(s): "LABPROT", "INR" in the last 72 hours. Hepatitis Panel No results for input(s): "HEPBSAG", "HCVAB", "HEPAIGM", "HEPBIGM" in the last 72 hours. C-Diff No results for input(s): "CDIFFTOX" in the last 72 hours. No results for input(s): "CDIFFPCR" in the last 72 hours. Fecal Lactopherrin No results for input(s): "FECLLACTOFRN" in the last 72 hours.  Studies/Results: DG Abd 1 View  Result Date: 06/16/2022 CLINICAL DATA:  Check gastric catheter placement EXAM: ABDOMEN - 1 VIEW COMPARISON:  06/15/2022 FINDINGS: Weighted feeding catheter is noted coiled within the stomach. No free air is seen. No obstructive changes are noted. IMPRESSION: Weighted feeding catheter in the stomach. Electronically Signed   By: Inez Catalina M.D.   On: 06/16/2022 23:01   DG Abd 1 View  Result Date: 06/15/2022 CLINICAL DATA:  NG tube placement EXAM: ABDOMEN - 1 VIEW COMPARISON:  Abdominal x-ray 06/15/2022 FINDINGS: Nasogastric tube tip is in the proximal stomach just beyond the gastroesophageal junction. No dilated bowel loops are seen. IMPRESSION: Nasogastric tube tip is in the proximal stomach just beyond the gastroesophageal junction. Recommend advancing tube. Electronically Signed   By: Ronney Asters M.D.   On: 06/15/2022 15:07    Medications: I have reviewed the patient's current medications. Prior to Admission:  Medications Prior to Admission  Medication Sig Dispense Refill Last Dose   acetaminophen (TYLENOL) 500 MG tablet Take 1,000 mg by mouth every 8 (eight) hours as needed for moderate pain.   unk   amLODipine (NORVASC) 5 MG tablet Take 1 tablet (5 mg total) by mouth  daily. 90 tablet 3 06/04/2022   atorvastatin (LIPITOR) 40 MG tablet Take 1/2 tablet (20 mg total) by mouth daily. 90 tablet 3 06/04/2022   Cholecalciferol (VITAMIN D3 PO) Take 2,000 Units by mouth daily.   06/04/2022   cholestyramine (QUESTRAN) 4 GM/DOSE powder Take 4 g by mouth 2 (two) times daily.   06/04/2022   diphenoxylate-atropine  (LOMOTIL) 2.5-0.025 MG tablet Take 2 tablets by mouth 4 (four) times daily as needed for diarrhea or loose stools.   06/04/2022   ferrous sulfate 325 (65 FE) MG EC tablet Take 1 tablet (325 mg total) by mouth 2 (two) times daily. 60 tablet 1 06/04/2022   imatinib (GLEEVEC) 400 MG tablet Take 1 tablet (400 mg total) by mouth daily. Take with meals and large glass of water.Caution:Chemotherapy. (Patient taking differently: Take 400 mg by mouth at bedtime. Take with meals and large glass of water.Caution:Chemotherapy.) 30 tablet 3 06/04/2022   loperamide (IMODIUM A-D) 2 MG tablet Take 4 mg by mouth 4 (four) times daily as needed for diarrhea or loose stools.   06/05/2022   megestrol (MEGACE ES) 625 MG/5ML suspension TAKE 5 MLS (625 MG TOTAL) BY MOUTH DAILY. 450 mL 1 06/05/2022   mirtazapine (REMERON) 15 MG tablet Take 1 tablet (15 mg total) by mouth at bedtime. 30 tablet 2 06/04/2022   Multiple Vitamins-Minerals (PRESERVISION AREDS 2) CAPS Take 1 capsule by mouth 2 (two) times daily.   06/04/2022   omeprazole (PRILOSEC) 40 MG capsule Take 1 capsule (40 mg total) by mouth daily. 90 capsule 3 06/04/2022   ondansetron (ZOFRAN) 4 MG tablet Take 4 mg by mouth every 8 (eight) hours as needed for nausea or vomiting.   unk   OVER THE COUNTER MEDICATION Take 3 tablets by mouth daily.  Balance of Nature Fruits   Past Week   OVER THE COUNTER MEDICATION Take 3 tablets by mouth daily. Balance of Nature Vegetable   Past Week   polycarbophil (FIBERCON) 625 MG tablet Take 2,500 mg by mouth daily. Taking 4 tabs ( 625 mg each)  daily   06/04/2022   prochlorperazine (COMPAZINE) 10 MG tablet Take 1 tablet (10 mg total) by mouth every 6 (six) hours as needed for nausea or vomiting. 30 tablet 0 Past Week   sucralfate (CARAFATE) 1 g tablet Take 1 tablet by mouth 4 times daily -  with meals and at bedtime. 60 tablet 1 06/04/2022   potassium chloride SA (KLOR-CON M) 20 MEQ tablet Take 1 tablet (20 mEq total) by mouth daily. 7 tablet 0     Scheduled:  acetaminophen (TYLENOL) oral liquid 160 mg/5 mL  1,000 mg Oral TID   aspirin  81 mg Per Tube Daily   atorvastatin  20 mg Oral Daily   cholestyramine light  4 g Per Tube BID   clopidogrel  75 mg Per Tube Daily   feeding supplement  237 mL Oral TID BM   feeding supplement (PROSource TF20)  60 mL Per Tube Daily   losartan  50 mg Oral Daily   metoprolol tartrate  25 mg Oral BID   mirtazapine  7.5 mg Oral QHS   nystatin  5 mL Oral QID   pantoprazole (PROTONIX) IV  40 mg Intravenous Q24H   sucralfate  1 g Oral TID WC & HS   Continuous:  sodium chloride 10 mL/hr at 06/15/22 2022   feeding supplement (OSMOLITE 1.5 CAL) 60 mL/hr at 06/17/22 0500    Assessment/Plan: 1) Odynophagia/GERD/Feeding difficulties with oral candidiasis- Esophageal  candidiasis versus a radiation stricture-history of radiation for lung cancer-he is scheduled for an EGD tomorrow.  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 3) IDA on supplements. 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 5) History of rectal GIST. 6) CHF. 7) Sever malnutrition.   LOS: 12 days   Juanita Craver 06/17/2022, 8:59 AM

## 2022-06-18 ENCOUNTER — Inpatient Hospital Stay (HOSPITAL_COMMUNITY): Payer: Medicare Other | Admitting: Anesthesiology

## 2022-06-18 ENCOUNTER — Encounter (HOSPITAL_COMMUNITY)
Admission: EM | Disposition: A | Discharge status: Skilled Nursing Facility | Payer: Self-pay | Source: Home / Self Care | Attending: Internal Medicine

## 2022-06-18 ENCOUNTER — Inpatient Hospital Stay: Payer: Medicare Other

## 2022-06-18 ENCOUNTER — Ambulatory Visit: Payer: Medicare Other

## 2022-06-18 ENCOUNTER — Inpatient Hospital Stay (HOSPITAL_COMMUNITY): Payer: Medicare Other

## 2022-06-18 ENCOUNTER — Encounter (HOSPITAL_COMMUNITY): Payer: Self-pay | Admitting: Internal Medicine

## 2022-06-18 DIAGNOSIS — Z515 Encounter for palliative care: Secondary | ICD-10-CM | POA: Diagnosis not present

## 2022-06-18 DIAGNOSIS — K222 Esophageal obstruction: Secondary | ICD-10-CM

## 2022-06-18 DIAGNOSIS — J449 Chronic obstructive pulmonary disease, unspecified: Secondary | ICD-10-CM

## 2022-06-18 DIAGNOSIS — A419 Sepsis, unspecified organism: Secondary | ICD-10-CM | POA: Diagnosis not present

## 2022-06-18 DIAGNOSIS — K449 Diaphragmatic hernia without obstruction or gangrene: Secondary | ICD-10-CM | POA: Insufficient documentation

## 2022-06-18 DIAGNOSIS — R1319 Other dysphagia: Secondary | ICD-10-CM

## 2022-06-18 DIAGNOSIS — K2289 Other specified disease of esophagus: Secondary | ICD-10-CM

## 2022-06-18 DIAGNOSIS — T66XXXA Radiation sickness, unspecified, initial encounter: Secondary | ICD-10-CM | POA: Diagnosis present

## 2022-06-18 DIAGNOSIS — K208 Other esophagitis without bleeding: Secondary | ICD-10-CM | POA: Insufficient documentation

## 2022-06-18 DIAGNOSIS — R1310 Dysphagia, unspecified: Secondary | ICD-10-CM

## 2022-06-18 DIAGNOSIS — R6520 Severe sepsis without septic shock: Secondary | ICD-10-CM | POA: Diagnosis not present

## 2022-06-18 DIAGNOSIS — I2510 Atherosclerotic heart disease of native coronary artery without angina pectoris: Secondary | ICD-10-CM

## 2022-06-18 DIAGNOSIS — I10 Essential (primary) hypertension: Secondary | ICD-10-CM

## 2022-06-18 DIAGNOSIS — C3492 Malignant neoplasm of unspecified part of left bronchus or lung: Secondary | ICD-10-CM | POA: Diagnosis not present

## 2022-06-18 DIAGNOSIS — Z87891 Personal history of nicotine dependence: Secondary | ICD-10-CM

## 2022-06-18 HISTORY — PX: ESOPHAGOGASTRODUODENOSCOPY (EGD) WITH PROPOFOL: SHX5813

## 2022-06-18 HISTORY — PX: BIOPSY: SHX5522

## 2022-06-18 HISTORY — PX: ESOPHAGEAL DILATION: SHX303

## 2022-06-18 LAB — CBC
HCT: 26.9 % — ABNORMAL LOW (ref 39.0–52.0)
HCT: 23.9 % — ABNORMAL LOW (ref 39.0–52.0)
Hemoglobin: 8.7 g/dL — ABNORMAL LOW (ref 13.0–17.0)
Hemoglobin: 7.9 g/dL — ABNORMAL LOW (ref 13.0–17.0)
MCH: 30.2 pg (ref 26.0–34.0)
MCH: 30.4 pg (ref 26.0–34.0)
MCHC: 32.3 g/dL (ref 30.0–36.0)
MCHC: 33.1 g/dL (ref 30.0–36.0)
MCV: 93.4 fL (ref 80.0–100.0)
MCV: 91.9 fL (ref 80.0–100.0)
Platelets: 236 10*3/uL (ref 150–400)
Platelets: 247 10*3/uL (ref 150–400)
RBC: 2.88 MIL/uL — ABNORMAL LOW (ref 4.22–5.81)
RBC: 2.6 MIL/uL — ABNORMAL LOW (ref 4.22–5.81)
RDW: 14.4 % (ref 11.5–15.5)
RDW: 14.4 % (ref 11.5–15.5)
WBC: 3.1 10*3/uL — ABNORMAL LOW (ref 4.0–10.5)
WBC: 3.1 10*3/uL — ABNORMAL LOW (ref 4.0–10.5)
nRBC: 0 % (ref 0.0–0.2)
nRBC: 0 % (ref 0.0–0.2)

## 2022-06-18 LAB — PHOSPHORUS: Phosphorus: 5 mg/dL — ABNORMAL HIGH (ref 2.5–4.6)

## 2022-06-18 LAB — BASIC METABOLIC PANEL
Anion gap: 9 (ref 5–15)
BUN: 34 mg/dL — ABNORMAL HIGH (ref 8–23)
CO2: 24 mmol/L (ref 22–32)
Calcium: 9 mg/dL (ref 8.9–10.3)
Chloride: 102 mmol/L (ref 98–111)
Creatinine, Ser: 0.7 mg/dL (ref 0.61–1.24)
GFR, Estimated: >60 mL/min (ref >60)
Glucose, Bld: 109 mg/dL — ABNORMAL HIGH (ref 70–99)
Potassium: 4.9 mmol/L (ref 3.5–5.1)
Sodium: 135 mmol/L (ref 135–145)

## 2022-06-18 LAB — GLUCOSE, CAPILLARY
Glucose-Capillary: 105 mg/dL — ABNORMAL HIGH (ref 70–99)
Glucose-Capillary: 131 mg/dL — ABNORMAL HIGH (ref 70–99)
Glucose-Capillary: 143 mg/dL — ABNORMAL HIGH (ref 70–99)
Glucose-Capillary: 145 mg/dL — ABNORMAL HIGH (ref 70–99)
Glucose-Capillary: 89 mg/dL (ref 70–99)
Glucose-Capillary: 93 mg/dL (ref 70–99)
Glucose-Capillary: 99 mg/dL (ref 70–99)

## 2022-06-18 LAB — MAGNESIUM: Magnesium: 2.1 mg/dL (ref 1.7–2.4)

## 2022-06-18 SURGERY — ESOPHAGOGASTRODUODENOSCOPY (EGD) WITH PROPOFOL
Anesthesia: Monitor Anesthesia Care

## 2022-06-18 MED ORDER — LIDOCAINE HCL 1 % IJ SOLN
INTRAMUSCULAR | Status: DC | PRN
  Administered 2022-06-18: 50 mg via INTRADERMAL

## 2022-06-18 MED ORDER — PROPOFOL 500 MG/50ML IV EMUL
INTRAVENOUS | Status: DC | PRN
  Administered 2022-06-18: 110 ug/kg/min via INTRAVENOUS

## 2022-06-18 MED ORDER — OSMOLITE 1.5 CAL PO LIQD
1360.0000 mL | ORAL | Status: DC
  Filled 2022-06-18: qty 1422
  Filled 2022-06-18: qty 2000
  Filled 2022-06-18: qty 1422

## 2022-06-18 MED ORDER — ORAL CARE MOUTH RINSE
15.0000 mL | OROMUCOSAL | Status: DC
  Administered 2022-06-18 – 2022-07-05 (×62): 15 mL via OROMUCOSAL

## 2022-06-18 MED ORDER — PROPOFOL 10 MG/ML IV BOLUS
INTRAVENOUS | Status: DC | PRN
  Administered 2022-06-18 (×2): 10 mg via INTRAVENOUS

## 2022-06-18 MED ORDER — MORPHINE SULFATE (PF) 2 MG/ML IV SOLN
2.0000 mg | INTRAVENOUS | Status: DC | PRN
  Administered 2022-06-25 – 2022-06-28 (×3): 2 mg via INTRAVENOUS
  Filled 2022-06-18 (×4): qty 1

## 2022-06-18 MED ORDER — LACTATED RINGERS IV SOLN: INTRAVENOUS | Status: DC

## 2022-06-18 MED ORDER — PHENYLEPHRINE HCL (PRESSORS) 10 MG/ML IV SOLN
INTRAVENOUS | Status: DC | PRN
  Administered 2022-06-18: 100 ug via INTRAVENOUS

## 2022-06-18 MED ORDER — ORAL CARE MOUTH RINSE: 15.0000 mL | OROMUCOSAL | Status: DC | PRN

## 2022-06-18 MED ORDER — SODIUM CHLORIDE 0.9 % IV SOLN: INTRAVENOUS | Status: DC

## 2022-06-18 MED ORDER — LIDOCAINE VISCOUS HCL 2 % MT SOLN
15.0000 mL | Freq: Three times a day (TID) | OROMUCOSAL | Status: DC
  Administered 2022-06-18 – 2022-07-06 (×58): 15 mL via OROMUCOSAL
  Filled 2022-06-18 (×77): qty 15

## 2022-06-18 SURGICAL SUPPLY — 15 items

## 2022-06-18 NOTE — Anesthesia Preprocedure Evaluation (Addendum)
Anesthesia Evaluation  Patient identified by MRN, date of birth, ID band Patient awake    Reviewed: Allergy & Precautions, NPO status , Patient's Chart, lab work & pertinent test results  Airway Mallampati: II  TM Distance: >3 FB Neck ROM: Full    Dental  (+) Teeth Intact, Dental Advisory Given   Pulmonary COPD, former smoker Left lung ca    Pulmonary exam normal breath sounds clear to auscultation       Cardiovascular hypertension, Pt. on medications + CAD  Normal cardiovascular exam Rhythm:Regular Rate:Normal  Echo 06/12/22:  1. Left ventricular ejection fraction, by estimation, is 40 to 45%. The  left ventricle has mildly decreased function. The left ventricle  demonstrates global hypokinesis. The left ventricular internal cavity size  was mildly dilated. Indeterminate  diastolic filling due to E-A fusion.   2. Right ventricular systolic function is normal. The right ventricular  size is normal. Tricuspid regurgitation signal is inadequate for assessing  PA pressure.   3. The mitral valve is grossly normal. No evidence of mitral valve  regurgitation.   4. The aortic valve is grossly normal. Aortic valve regurgitation is not  visualized. No aortic stenosis is present.   5. The inferior vena cava is normal in size with greater than 50%  respiratory variability, suggesting right atrial pressure of 3 mmHg.     Neuro/Psych negative neurological ROS     GI/Hepatic Neg liver ROS,GERD  Medicated,,Dysphagia, odynophagia abnormal weight loss iron deficiency anemia status post radiation for lung cancer  Rectal mass   Endo/Other  negative endocrine ROS    Renal/GU negative Renal ROS   Bladder ca    Musculoskeletal  (+) Arthritis , Osteoarthritis,    Abdominal   Peds  Hematology  (+) Blood dyscrasia, anemia   Anesthesia Other Findings Day of surgery medications reviewed with the patient.   Reproductive/Obstetrics                             Anesthesia Physical Anesthesia Plan  ASA: 4  Anesthesia Plan: MAC   Post-op Pain Management: Minimal or no pain anticipated   Induction: Intravenous  PONV Risk Score and Plan: 1 and TIVA and Treatment may vary due to age or medical condition  Airway Management Planned: Natural Airway and Simple Face Mask  Additional Equipment:   Intra-op Plan:   Post-operative Plan:   Informed Consent: I have reviewed the patients History and Physical, chart, labs and discussed the procedure including the risks, benefits and alternatives for the proposed anesthesia with the patient or authorized representative who has indicated his/her understanding and acceptance.     Dental advisory given  Plan Discussed with: CRNA  Anesthesia Plan Comments:         Anesthesia Quick Evaluation

## 2022-06-18 NOTE — Hospital Course (Addendum)
Jesse [name] is a 72 y.o. 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   Found to have pneumonia, Klebsiella bacteremia and small embolic stroke   Hospitalization complicated by new reduced EF and also poor oral intake manage/radiation esophagitis requiring tube feeding, GI following.

## 2022-06-18 NOTE — Progress Notes (Signed)
Daily Progress Note   Patient Name: Jesse [name]       Date: 06/18/2022 DOB: 1951/01/13  Age: 72 y.o. MRN#: [id number] Attending Physician: Antonieta Pert, MD Primary Care Physician: [name], MD Admit Date: 06/05/2022  Reason for Consultation/Follow-up:   "lung cancer patient presented with with bacteremia.has very poor intake ,on tube feed,very deconditioned,weak.Quality of life is very poor.Needs goals of care"   Patient Profile/HPI: 72 y.o. male  with past medical history of GIST tumor of pelvic area (dx 12/2019) - s/p resection and loop ileostomy for fecal diversion 12/14/21 and takedown on 03/22/22 on Gleevec; [place] lung cancer dx June 2021 with positive mediastinal lymph node December 2023 currently on concurrent chemoradiation tx with carbo/paclitaxel admitted on 06/05/2022 with sepsis d/t bacteremia- psuedomonas klebsiella after being very weak and unresponsive at home. Further workup revealed multiple scattered embolic strokes- workup in progress to determine cause. He has had poor po intake and significant weight loss- cortrak tube feeding is in place.       Subjective: Chart reviewed including labs, progress notes, imaging from this and previous encounters.  Noted EGD with confirmed radiation esophagitis and small stricture that was dilated. He has pain in his back and throat.  [name] is awake, although exhausted. He was hungry this morning- but was NPO so not able to receive any food. Spouse reports he had a few days of worsening confusion- but appears to be better today.  Lidocaine use is sparse per medical record review. He is willing to use it before each meal.  Advance directives were reviewed and are on chart- he would not want prolonged artificial life support in the event of terminal  condition.      Review of Systems  Constitutional:  Positive for malaise/fatigue.  Neurological:  Positive for weakness.     Physical Exam Vitals and nursing note reviewed.  Constitutional:      Comments: frail  Pulmonary:     Effort: Pulmonary effort is normal.  Neurological:     Mental Status: He is alert.     Motor: Weakness present.             Vital Signs: BP 107/63   Pulse (!) 107   Temp 98.4 F (36.9 C) (Oral)   Resp 18   Ht '5\' 9"'$  (1.753 m)  Wt 56.8 kg   SpO2 98%   BMI 18.49 kg/m  SpO2: SpO2: 98 % O2 Device: O2 Device: Room Air O2 Flow Rate: O2 Flow Rate (L/min): 3 L/min  Intake/output summary:  Intake/Output Summary (Last 24 hours) at 06/18/2022 1455 Last data filed at 06/18/2022 1300 Gross per 24 hour  Intake 910 ml  Output 1400 ml  Net -490 ml    LBM: Last BM Date : 06/17/22 Baseline Weight: Weight: 65.7 kg Most recent weight: Weight: 56.8 kg       Palliative Assessment/Data: PPS: 30%      Patient Active Problem List   Diagnosis Date Noted   Esophageal dysphagia 06/18/2022   Lower esophageal ring ([place]) 06/18/2022   Hiatal hernia 06/18/2022   Radiation-induced esophagitis 06/18/2022   Odynophagia 06/18/2022   Drug-induced neutropenia ([place]) 06/07/2022   Protein-calorie malnutrition, severe 06/07/2022   Bacteremia due to Klebsiella pneumoniae 06/06/2022   Pneumonia of right middle lobe due to Klebsiella pneumoniae ([place]) 06/06/2022   Severe sepsis ([place]) 06/05/2022   Hypokalemia 06/05/2022   AKI (acute kidney injury) ([place]) 06/05/2022   Hyponatremia 0000000   Acute metabolic encephalopathy 0000000   Acute respiratory failure with hypoxia ([place]) 06/05/2022   CAP (community acquired pneumonia) 06/05/2022   Thrombocytopenia ([place]) 06/05/2022   Sepsis ([place]) 06/05/2022   Diarrhea 06/05/2022   Weight loss 05/28/2022   Malignant neoplasm of lung ([place]) 04/30/2022   Adenopathy 04/06/2022   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 ([place]) 08/21/2021   Encounter for antineoplastic chemotherapy 01/28/2020   Adenocarcinoma of left lung, stage 2 ([place]) 12/31/2019   Goals of care, counseling/discussion 12/31/2019   S/P lobectomy of lung 12/16/2019   Lung nodule 10/13/2019   Essential hypertension 09/28/2016   Coronary artery calcification 09/28/2016   Impaired glucose tolerance 09/28/2016   NEOPLASM, MALIGNANT, BLADDER, HX OF 07/18/2007   Vitamin D deficiency 06/03/2007   DIVERTICULOSIS, COLON 01/14/2007   Osteoarthritis 01/14/2007   Dyslipidemia 01/09/2007   GERD 01/09/2007   REFLUX ESOPHAGITIS 06/01/2004   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   Thank you for allowing the Palliative Medicine Team to assist in the care of this patient.  Total time: 80 minutes   Greater than 50%  of this time was spent counseling and coordinating care related to the above assessment and plan.  Mariana Kaufman, AGNP-C Palliative Medicine   Please contact Palliative Medicine Team phone at [phone] for questions and concerns.

## 2022-06-18 NOTE — Progress Notes (Signed)
PT Cancellation Note  Patient Details Name: Jesse [name] MRN: [id number] DOB: 06-07-1950   Cancelled Treatment:    Reason Eval/Treat Not Completed: Patient declined, no reason specified Pt had EGD this morning and then had NG tube replaced so pt declined to participate at this time.     Myrtis Hopping [place] 06/18/2022, 2:46 PM Jannette Spanner PT, DPT Physical Therapist Acute Rehabilitation Services Preferred contact method: Secure Chat Weekend Pager Only: [phone] Office: [phone]

## 2022-06-18 NOTE — Op Note (Signed)
[facility] Patient Name: Jesse [name] Procedure Date: 06/18/2022 MRN: [id number] Attending MD: Gerrit Heck , MD, [id number] Date of Birth: 08-22-1950 CSN: [id number] Age: 72 Admit Type: Inpatient Procedure:                Upper GI endoscopy w/ balloon dilation and biopsy Indications:              Dysphagia, Odynophagia Providers:                Gerrit Heck, MD, Jaci Carrel, RN, Gloris Ham, Technician Referring MD:              Medicines:                Monitored Anesthesia Care Complications:            No immediate complications. Estimated Blood Loss:     Estimated blood loss was minimal. Procedure:                Pre-Anesthesia Assessment:                           - Prior to the procedure, a History and Physical                            was performed, and patient medications and                            allergies were reviewed. The patient's tolerance of                            previous anesthesia was also reviewed. The risks                            and benefits of the procedure and the sedation                            options and risks were discussed with the patient.                            All questions were answered, and informed consent                            was obtained. Prior Anticoagulants: The patient has                            taken Plavix (clopidogrel) and ASA 81 mg, last dose                            was 1 day prior to procedure. ASA Grade Assessment:                            IV - A patient with severe systemic disease that is  a constant threat to life. After reviewing the                            risks and benefits, the patient was deemed in                            satisfactory condition to undergo the procedure.                           After obtaining informed consent, the endoscope was                            passed under direct vision.  Throughout the                            procedure, the patient's blood pressure, pulse, and                            oxygen saturations were monitored continuously. The                            [id number] Olympus endoscope was introduced                            through the mouth, and advanced to the second part                            of duodenum. The upper GI endoscopy was                            accomplished without difficulty. The patient                            tolerated the procedure well. Scope In: Scope Out: Findings:      Localized mucosal changes characterized by erythema and a decreased       vascular pattern were found in the upper and middle third of the       esophagus, from 25-33 cm from the incisors. The apperance is most       consistent with radiation esophagitis. Biopsies were taken with a cold       forceps for histology. Estimated blood loss was minimal.      A mild Schatzki ring was found in the lower third of the esophagus.       Given the dysphagia, the decision was made to perform careful endoscopic       dilation. A TTS dilator was passed through the scope. Dilation with a       15-16.5-18 mm balloon dilator was performed to 18 mm. The dilation site       was examined and showed mild mucosal disruption, consistent with       appropriate dilation. Estimated blood loss was minimal.      A 2 cm hiatal hernia was present.      The entire examined stomach was normal. There was a Dobhoff tube in       place, curled in the stomach. At  the conclusion of the procedure, this       was advanced into the small intestine using a snare. The Dobhoff       remained in place upon endoscope withdrawal.      The examined duodenum was normal. Impression:               - Erythematous, decreased vascular pattern mucosa                            in the esophagus. Biopsied.                           - Mild Schatzki ring. Dilated with an 18 mm TTS                             balloon with appropriate, small mucosal rent.                           - 2 cm hiatal hernia.                           - Normal stomach.                           - Normal examined duodenum. Moderate Sedation:      Not Applicable - Patient had care per Anesthesia. Recommendation:           - Return patient to hospital ward for ongoing care.                           - Continue present medications.                           - Await pathology results.                           - Ok to resume tube feeds.                           - Resume nystatin, carafate, viscous lidocaine.                           - Resume PPI as prescribed.                           - Inpatient GI service will continue to follow.                           - Results relayed to the inpatient Hospitalist team                            and primary inpatient GI team.                           - Results relayed to the patient at bedside in  recovery along with spouse by phone per patient                            request. Procedure Code(s):        --- Professional ---                           [phone], Esophagogastroduodenoscopy, flexible,                            transoral; with transendoscopic balloon dilation of                            esophagus (less than 30 mm diameter)                           43239, 59, Esophagogastroduodenoscopy, flexible,                            transoral; with biopsy, single or multiple Diagnosis Code(s):        --- Professional ---                           K22.89, Other specified disease of esophagus                           K22.2, Esophageal obstruction                           K44.9, Diaphragmatic hernia without obstruction or                            gangrene                           R13.10, Dysphagia, unspecified CPT copyright 2022 [employer]. All rights reserved. The codes documented in this report are preliminary  and upon coder review may  be revised to meet current compliance requirements. Gerrit Heck, MD 06/18/2022 10:34:32 AM Number of Addenda: 0

## 2022-06-18 NOTE — Progress Notes (Signed)
PROGRESS NOTE Jesse [name]  [id number] DOB: June 21, 1950 DOA: 06/05/2022 PCP: [name], MD  Brief Narrative/Hospital Course: 72 year old male with history of GIST of rectum s/p resection/ileostomy then undergoing ileostomy takedown on 03/22/2022, on imatinib, history of recurrent adenocarcinoma the left lung continue concurrent radiation and chemotherapy presented with confusion, hypoxia, unresponsive initially on presentation, he was found to have sepsis with Klebsiella bacteremia and pneumonia and also a small acute embolic ischemic stroke.  Patient seen by cardiology palliative care neurology and gastroenterology in consultation.  Echo showed slightly reduced EF.  Hospitalization complicated with persistently poor oral intake manage on tube feeding, GI following.   Subjective: Seen and examined.  Wife at the bedside patient feels hungry No new complaints, has back pain This morning patient had endoscopy done see report below. Overnight afebrile, BP soft/low 86/55 post EGD> subsequently improved  Assessment and Plan: Principal Problem:   Severe sepsis ([place]) Active Problems:   Dyslipidemia   Essential hypertension   Adenocarcinoma of left lung, stage 2 (HCC)   Malignant neoplasm of lung (HCC)   Hypokalemia   AKI (acute kidney injury) ([place])   Hyponatremia   Acute metabolic encephalopathy   Acute respiratory failure with hypoxia (HCC)   CAP (community acquired pneumonia)   Thrombocytopenia (HCC)   Sepsis ([place])   Diarrhea   Bacteremia due to Klebsiella pneumoniae   Pneumonia of right middle lobe due to Klebsiella pneumoniae ([place])   Drug-induced neutropenia (HCC)   Protein-calorie malnutrition, severe   Esophageal dysphagia   Lower esophageal ring ([place])   Hiatal hernia   Radiation-induced esophagitis   Odynophagia  Odynophagia/dysphagia Oral thrush Anorexia Radiation esophagitis Schatzki Ring Suspected esophageal candidiasis: Having persistently poor oral  intake, had oral thrush which is improved received Diflucan but discontinued subsequently due to interaction with Plavix.  Underwent EGD 3/3  noted "Radiation injury in the mid/upper esophagus from 25-33 cm (biopsied). Benign appearing [place]ki ring in the lower esophagus, dilated with 18 mm TTS balloon. No clear esophageal candida on this study, but could be due to him being on therapy for a few days now" > GI advised to and was advanced into the duodenum using a snare. Start DYS 3 diet TF  Severe sepsis present on admission due to Klebsiella pneumonia bacteremia and pneumonia: Completed 10 days of Rocephin on 2/21, was seen by ID.  Resolved and vitals stable  Acute metabolic encephalopathy Unresponsive/confused on presentation: Multifactorial, currently alert awake oriented to person place, not a reliable historian.   Acute CVAs incidentally found on MRI brain: Seen by neurology thought to be embolic>started on aspirin and Plavix.  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  Seen by cardiology recommended close Zio patch upon discharge  Cardiomyopathy, LVEF 40 to 45%: Incidentally noted on echocardiogram seen by cardiology no further ischemic workup continue current metoprolol prn Lasix.  Monitor volume status.  Currently euvolemic to dry side.  Monitor net balance and weight as below. Net IO Since Admission: 4,500.25 mL [06/18/22 1134]  Filed Weights   06/17/22 0409 06/18/22 0500 06/18/22 0934  Weight: 59.5 kg 56.8 kg 56.8 kg     Acute on chronic diarrhea: History of anterior resection syndrome.  S/P C DIFF test- C diff + but toxin was negative.GI pathogen panel negative.  cont Questran, contact precaution, symptomatic management.  Diarrhea improved  AKI: Resolved    Hypertension: on losartan,  metoprolol. Monitor BP  Pancytopenia with neutropenia thrombocytopenia normocytic anemia: Multifactorial in the setting of malignancy, status post filgrastim x 3, WBC normalized platelets stable hemoglobin overall stable FOBT positive on 2/21 : Monitor transfused on 2/25.  Continue to monitor hemoglobin Recent Labs  Lab 06/12/22 0330 06/13/22 1902 06/15/22 1802 06/18/22 0913  HGB 9.0* 9.5* 8.8* 8.7*  HCT 27.6* 28.9* 28.7* 26.9*     Recurrent adenocarcinoma of the lung (diagnosed in 2021):S/P left upper lobectomy with lymph node dissection on December 16, 2019,repeat video bronchoscopy with EBUS on April 11, 2022 and biopsy from the station 7 lymph node showed recurrent adenocarcinoma.  Followed by Dr. Julien Nordmann, currently was undergoing a course of concurrent chemoradiation with weekly carboplatin for AUC of 2 and paclitaxel 45 Mg/M2. First dose May 14, 2022.  Currently on hold is hospitalized  GIST tumor of rectum -s/p Neoadjuvant treatment with imatinib 400 mg p.o. daily for the GIST tumor of the pelvic area. First dose started July 18, 2020. Status post 12 months of treatment. -s/p  robotic assisted very low anterior rectosigmoid resection with coloanal anastomosis, diverting loop ileostomy and wedge liver biopsy under the care of Dr. Johney Maine on December 14, 2021 and it showed minimal residual gastrointestinal stromal tumor.  -s/p undergoing ileostomy takedown on 03/22/2022  -home meds imatinib held F/u oncology Dr. Julien Nordmann   Deconditioning/debility weakness continue PT OT recommending skilled nursing facility   Goals of care: Complex comorbidities, lung cancer now with cachexia poor appetite recent sick ascites overall prognosis does not appear bright, palliative care has been consulted, remains full code  Severe malnutrition continue to augment diet as tolerated with tube feeds/dysphagia 3 Nutrition Problem: Severe Malnutrition Etiology: chronic illness (lung cancer currently  undergoing chemotherapy and radiation therapy) Signs/Symptoms: severe fat depletion, severe muscle depletion, percent weight loss (15% in 6 months) Percent weight loss: 15 % (in 6 months) Interventions: Ensure Enlive (each supplement provides 350kcal and 20 grams of protein), Tube feeding    Interventions: Ensure Enlive (each supplement provides 350kcal and 20 grams of protein), Tube feeding Body mass index is 18.49 kg/m.:Will benefit with PCP follow-up, weight loss  healthy lifestyle and outpatient sleep evaluation.  DVT prophylaxis: SCDs Start: 06/06/22 0153 Code Status:   Code Status: Full Code Family Communication: plan of care discussed with patient AND HIS WIFE at bedside. Patient status is:  INPATIENT because of dysphagia Level of care: Telemetry   Dispo: The patient is from: Home            Anticipated disposition: SNF in Objective: Vitals last 24 hrs: Vitals:   06/18/22 0934 06/18/22 1026 06/18/22 1033 06/18/22 1046  BP: 108/62 (!) 86/55 115/67 123/82  Pulse: (!) 105 97 91 (!) 103  Resp: '12 19 16 18  '$ Temp: 97.8 F (36.6 C) (!) 97.3 F (36.3 C)    TempSrc: Temporal Temporal    SpO2: 100% 100% 100% 100%  Weight: 56.8 kg     Height: '5\' 9"'$  (1.753 m)      Weight change: -2.7 kg  Physical Examination: General exam: alert awake, thin cachectic, frail older than stated age HEENT:Oral mucosa moist, Ear/Nose WNL grossly Respiratory system: bilaterally CLEAR BS, no use of accessory muscle Cardiovascular system: S1 & S2 +, No JVD. Gastrointestinal system: Abdomen soft,NT,ND, BS+ Nervous System:Alert, awake, moving extremities. Extremities: LE edema NEG,distal peripheral pulses palpable.  Skin: No rashes,no icterus. MSK: Normal muscle bulk,tone, power Ngt+  Medications reviewed:  Scheduled  Meds:  acetaminophen (TYLENOL) oral liquid 160 mg/5 mL  1,000 mg Oral TID   aspirin  81 mg Per Tube Daily   atorvastatin  20 mg Oral Daily   cholestyramine light  4 g Per Tube BID    clopidogrel  75 mg Per Tube Daily   feeding supplement  237 mL Oral TID BM   feeding supplement (PROSource TF20)  60 mL Per Tube Daily   losartan  50 mg Oral Daily   metoprolol tartrate  25 mg Oral BID   mirtazapine  7.5 mg Oral QHS   nystatin  5 mL Oral QID   mouth rinse  15 mL Mouth Rinse 4 times per day   pantoprazole (PROTONIX) IV  40 mg Intravenous Q24H   sucralfate  1 g Oral TID WC & HS   Continuous Infusions:  sodium chloride 10 mL/hr at 06/15/22 2022   feeding supplement (OSMOLITE 1.5 CAL) Stopped (06/17/22 2330)      Diet Order             DIET DYS 3 Room service appropriate? Yes; Fluid consistency: Thin  Diet effective now                   Intake/Output Summary (Last 24 hours) at 06/18/2022 1132 Last data filed at 06/18/2022 1031 Gross per 24 hour  Intake 890 ml  Output 1050 ml  Net -160 ml   Net IO Since Admission: 4,500.25 mL [06/18/22 1132]  Wt Readings from Last 3 Encounters:  06/18/22 56.8 kg  06/11/22 67.2 kg  06/04/22 61.6 kg     Unresulted Labs (From admission, onward)     Start     Ordered   06/18/22 0500  Phosphorus  Every Monday (0500),   R     Question:  Specimen collection method  Answer:  Lab=Lab collect   06/14/22 1118   06/18/22 [id number]  Basic metabolic panel  Every Mon-Wed-Fri (0500),   R     Question:  Specimen collection method  Answer:  Lab=Lab collect   06/17/22 0812   06/15/22 0500  Magnesium  Every Mon-Wed-Fri (0500),   R     Question:  Specimen collection method  Answer:  Lab=Lab collect   06/14/22 1118   06/13/22 1846  CBC  Every Mon-Wed-Fri,   R     Question:  Specimen collection method  Answer:  Lab=Lab collect   06/13/22 1846          Data Reviewed: I have personally reviewed following labs and imaging studies CBC: Recent Labs  Lab 06/12/22 0330 06/13/22 1902 06/15/22 1802 06/18/22 0913  WBC 5.1 5.0 4.8 3.1*  HGB 9.0* 9.5* 8.8* 8.7*  HCT 27.6* 28.9* 28.7* 26.9*  MCV 94.2 92.6 98.6 93.4  PLT 104* 152 202 [id number]    Basic Metabolic Panel: Recent Labs  Lab 06/12/22 0330 06/14/22 0349 06/15/22 0439 06/16/22 0341 06/17/22 0701 06/18/22 0543  NA 138 133* 136 137 134* 135  K 4.3 4.5 4.6 4.7 4.5 4.9  CL 106 105 105 107 107 102  CO2 '25 24 24 23 23 24  '$ GLUCOSE 122* 125* 113* 123* 134* 109*  BUN 29* 28* 32* 31* 33* 34*  CREATININE 0.53* 0.56* 0.55* 0.52* 0.41* 0.70  CALCIUM 7.9* 7.8* 8.4* 8.4* 8.3* 9.0  MG 1.6* 2.0 1.9  --   --  2.1  PHOS 3.4 3.4  --   --   --  5.0*   GFR: Estimated Creatinine Clearance: 67.1 mL/min (by  C-G formula based on SCr of 0.7 mg/dL). Liver Function Tests: Recent Labs  Lab 06/12/22 0330  AST 24  ALT 21  ALKPHOS 104  BILITOT 0.5  PROT 5.5*  ALBUMIN 2.1*   Recent Labs  Lab 06/17/22 1624 06/17/22 2001 06/18/22 0000 06/18/22 0429 06/18/22 0808  GLUCAP 138* 112* 131* 105* 99    No results found for this or any previous visit (from the past 240 hour(s)).  Antimicrobials: Anti-infectives (From admission, onward)    Start     Dose/Rate Route Frequency Ordered Stop   06/15/22 2000  fluconazole (DIFLUCAN) IVPB 200 mg  Status:  Discontinued        200 mg 100 mL/hr over 60 Minutes Intravenous Every 24 hours 06/15/22 1720 06/16/22 1501   06/14/22 1200  cefTRIAXone (ROCEPHIN) 2 g in sodium chloride 0.9 % 100 mL IVPB        2 g 200 mL/hr over 30 Minutes Intravenous Every 24 hours 06/14/22 1116 06/14/22 1725   06/12/22 1230  fluconazole (DIFLUCAN) IVPB 200 mg        200 mg 100 mL/hr over 60 Minutes Intravenous Every 24 hours 06/12/22 1134 06/12/22 1434   06/07/22 1200  vancomycin (VANCOCIN) IVPB 1000 mg/200 mL premix  Status:  Discontinued        1,000 mg 200 mL/hr over 60 Minutes Intravenous Every 36 hours 06/05/22 2323 06/06/22 1108   06/06/22 1200  cefTRIAXone (ROCEPHIN) 2 g in sodium chloride 0.9 % 100 mL IVPB  Status:  Discontinued        2 g 200 mL/hr over 30 Minutes Intravenous Every 24 hours 06/06/22 1110 06/14/22 1116   06/06/22 0200  ceFEPIme  (MAXIPIME) 2 g in sodium chloride 0.9 % 100 mL IVPB  Status:  Discontinued        2 g 200 mL/hr over 30 Minutes Intravenous Every 12 hours 06/05/22 2323 06/06/22 1108   06/05/22 2315  vancomycin (VANCOCIN) IVPB 1000 mg/200 mL premix        1,000 mg 200 mL/hr over 60 Minutes Intravenous  Once 06/05/22 2307 06/06/22 0145   06/05/22 1900  cefTRIAXone (ROCEPHIN) 2 g in sodium chloride 0.9 % 100 mL IVPB  Status:  Discontinued        2 g 200 mL/hr over 30 Minutes Intravenous Every 24 hours 06/05/22 1847 06/05/22 2323   06/05/22 1900  azithromycin (ZITHROMAX) 500 mg in sodium chloride 0.9 % 250 mL IVPB  Status:  Discontinued        500 mg 250 mL/hr over 60 Minutes Intravenous Every 24 hours 06/05/22 1847 06/06/22 1108   06/05/22 1830  ceFEPIme (MAXIPIME) 2 g in sodium chloride 0.9 % 100 mL IVPB  Status:  Discontinued        2 g 200 mL/hr over 30 Minutes Intravenous  Once 06/05/22 1820 06/05/22 1847   06/05/22 1830  metroNIDAZOLE (FLAGYL) IVPB 500 mg  Status:  Discontinued        500 mg 100 mL/hr over 60 Minutes Intravenous  Once 06/05/22 1820 06/05/22 1847   06/05/22 1830  vancomycin (VANCOCIN) IVPB 1000 mg/200 mL premix  Status:  Discontinued        1,000 mg 200 mL/hr over 60 Minutes Intravenous  Once 06/05/22 1820 06/05/22 1826   06/05/22 1830  vancomycin (VANCOREADY) IVPB 1500 mg/300 mL  Status:  Discontinued        1,500 mg 150 mL/hr over 120 Minutes Intravenous  Once 06/05/22 1826 06/05/22 1847  Culture/Microbiology    Component Value Date/Time   SDES  06/06/2022 1644    URINE, CLEAN CATCH Performed at [facility], [place] [address], [place], [place] [address]    SPECREQUEST  06/06/2022 1644    NONE Performed at [facility], [place] [address] [place], [place] [address]    CULT  06/06/2022 1644    NO GROWTH Performed at [facility], [place] [address], [place], [place] [address]    REPTSTATUS 06/07/2022 FINAL 06/06/2022 1644      Radiology Studies: DG Abd 1 View  Result Date: 06/16/2022 CLINICAL DATA:  Check gastric catheter placement EXAM: ABDOMEN - 1 VIEW COMPARISON:  06/15/2022 FINDINGS: Weighted feeding catheter is noted coiled within the stomach. No free air is seen. No obstructive changes are noted. IMPRESSION: Weighted feeding catheter in the stomach. Electronically Signed   By: Inez Catalina M.D.   On: 06/16/2022 23:01     LOS: 13 days   Antonieta Pert, MD [facility]  06/18/2022, 11:32 AM

## 2022-06-18 NOTE — TOC Progression Note (Signed)
Transition of Care [facility]) - Progression Note    Patient Details  Name: Jesse [name] MRN: [id number] Date of Birth: 1950-09-04  Transition of Care [facility]) CM/SW [place], LCSW Phone Number: 06/18/2022, 3:01 PM  Clinical Narrative:     Per chart review pt has Endoscopy and still has feeding tube. TOC will continue to follow for d/c needs.    Barriers to Discharge: Continued Medical Work up  Expected Discharge Plan and Services In-house Referral: NA, Nutrition Discharge Planning Services: CM Consult Post Acute Care Choice: [place] Living arrangements for the past 2 months: Single Family Home                 DME Arranged: N/A DME Agency: NA       HH Arranged: NA [place] Agency: NA         Social Determinants of Health (SDOH) Interventions SDOH Screenings   Food Insecurity: No Food Insecurity (06/06/2022)  Housing: Low Risk  (06/06/2022)  Transportation Needs: No Transportation Needs (06/06/2022)  Utilities: Not At Risk (06/06/2022)  Depression (PHQ2-9): Low Risk  (05/07/2022)  Financial Resource Strain: Low Risk  (07/18/2021)  Physical Activity: Sufficiently Active (07/18/2021)  Social Connections: Moderately Isolated (07/12/2020)  Stress: No Stress Concern Present (07/18/2021)  Tobacco Use: Medium Risk (06/18/2022)    Readmission Risk Interventions    06/11/2022    5:42 PM  Readmission Risk Prevention Plan  Transportation Screening Complete  PCP or Specialist Appt within 3-5 Days Complete  HRI or [place] Complete  Social Work Consult for [place] Planning/Counseling Complete  Palliative Care Screening Not Applicable  Medication Review Press photographer) Complete

## 2022-06-18 NOTE — Progress Notes (Signed)
Nutrition Follow-up  DOCUMENTATION CODES:   Severe malnutrition in context of chronic illness  INTERVENTION:  - DYS 3 diet.  -Ensure Plus High Protein po TID, each supplement provides 350 kcal and 20 grams of protein.  - Pt's wife also brining in Premier Protein from home, provides 160 kcals and 30g protein per bottle.   - Adjust tube feed to run time of 16 hours (off 8 hours during the day) to help promote better appetite and encourage intake:  Osmolite 1.5 @ 4m/hr x16 hours (1600-0800) via NGT (verified in gastric fundus per MD read of xray) *Start tonight at 667mhr and advance 1028mvery 4 hours to goal of 55m24m 60 ml Prosource TF daily -Tube feeding regimen provides 2120 kcals, 105 grams of protein, and 1036 ml of H2O.     - Monitor weight trends.    NUTRITION DIAGNOSIS:   Severe Malnutrition related to chronic illness (lung cancer currently undergoing chemotherapy and radiation therapy) as evidenced by severe fat depletion, severe muscle depletion, percent weight loss (15% in 6 months). *ongoing  GOAL:   Patient will meet greater than or equal to 90% of their needs *met with TF  MONITOR:   PO intake, Diet advancement, Labs, Weight trends, TF tolerance  REASON FOR ASSESSMENT:   Consult Assessment of nutrition requirement/status, Calorie Count  ASSESSMENT:   71 y9. male with medical history significant of lung cancer currently undergoing chemotherapy and radiation therapy, HTN, HLD, history of bladder cancer, GERD, hypokalemia, recurrent anemia, COPD, macular degeneration who presented with severe fatigue and confusion.  Calorie count complete with results below. It should be noted there has been only a few drinks/food items documented during both calorie counts so there has been insubstantial data to fully determine intake.  Day 1 (2/29): No meal intake documented.  *Patient did not order a breakfast or a dinner. Received chicken broth oatmeal but intake not  documented. Supplements: 50% 1 Ensure, 100% 1 Ensure Total intake (from supplements): 520 kcal (26% of minimum estimated needs)  30 protein (27% of minimum estimated needs)  Day 2 (3/1): No meal intake documented.  Supplements: 100% 2 Ensures, 2/3 protein drink brought by family Total intake (from supplements): 805 kcal (41% of minimum estimated needs)  60 protein (54% of minimum estimated needs)   Met with patient and wife at bedside.  TF reported to be going well. NGT became clogged and did have to get replaced today which patient was not happy about. New tube has been xray verified in the gastric fundus.   Wife reports patient has continued to not eat very well. Only wanting foods like mac and cheese, mashed potatoes, and oatmeal.  Patient was getting automatic house trays to ensure he received 3 meals a day but this was changed as patient usually refusing anything other than the foods mentioned above.  Has occasionally been accepting nutrition supplements but not always. Has been drinking at least 1 Ensure from facility daily, occasionally two. Wife reports patient often feels the supplements are too sweet. She plans to start mixing Ensure with milk to make them more palatable for patient. Wife brought in Premier Protein in coffee flavor which patient likes as it is less sweet.  Discussed decreasing the running time of tube feeds and allow patient time to feel more hungry during the day to help promote better oral intake. Wife on board with trying this. She would like to wait til tomorrow as his tube feeds have yet to be restarted since tube  replaced today.  Discussed with MD and MD on board with plan.  Will adjust orders for tube feed to run at 8m/hr for 16 hours (1600-0800), this will still meet ~95-100% of estimated needs.  As patient's tube feeds have been off since midnight plan to restart at 636mhr tonight and increase every 4 hours to 8574mr before being turned off at 0800  to allow patient 8 hours during the day to eat.   Medications reviewed and include: Remeron, Colace  Labs reviewed:  Phosphorus 5.0   Diet Order:   Diet Order             DIET DYS 3 Room service appropriate? Yes; Fluid consistency: Thin  Diet effective now                   EDUCATION NEEDS:  Education needs have been addressed  Skin:  Skin Assessment: Reviewed RN Assessment  Last BM:  3/3  Height:  Ht Readings from Last 1 Encounters:  06/18/22 '5\' 9"'$  (1.753 m)   Weight:  Wt Readings from Last 1 Encounters:  06/18/22 56.8 kg    BMI:  Body mass index is 18.49 kg/m.  Estimated Nutritional Needs:  Kcal:  1950-2300 kcals Protein:  110-130 grams Fluid:  >/= 1.9L    AspSamson Frederic, LDN For contact information, refer to [facility]

## 2022-06-18 NOTE — Anesthesia Postprocedure Evaluation (Signed)
Anesthesia Post Note  Patient: Jesse [name]  Procedure(s) Performed: ESOPHAGOGASTRODUODENOSCOPY (EGD) WITH PROPOFOL ESOPHAGEAL DILATION BIOPSY     Patient location during evaluation: Endoscopy Anesthesia Type: MAC Level of consciousness: oriented, awake and alert and awake Pain management: pain level controlled Vital Signs Assessment: post-procedure vital signs reviewed and stable Respiratory status: spontaneous breathing, nonlabored ventilation, respiratory function stable and patient connected to nasal cannula oxygen Cardiovascular status: blood pressure returned to baseline and stable Postop Assessment: no headache, no backache and no apparent nausea or vomiting Anesthetic complications: no   No notable events documented.  Last Vitals:  Vitals:   06/18/22 1046 06/18/22 1100  BP: 123/82 107/63  Pulse: (!) 103 (!) 107  Resp: 18 18  Temp:  36.9 C  SpO2: 100% 98%    Last Pain:  Vitals:   06/18/22 1100  TempSrc: Oral  PainSc:                  Santa Lighter

## 2022-06-18 NOTE — Interval H&P Note (Signed)
History and Physical Interval Note:  No acute events overnight.  Diflucan had to be held due to interaction with Plavix.  Plavix only held for 1 day for procedure today.  Still with dysphagia and odynophagia.  Dobbhoff in place.  Plan for EGD today for diagnostic and potentially therapeutic intent along with biopsies as appropriate.  I did explain therapeutic capabilities may be somewhat limited due to DAPT.  H/H stable at 8.7/27.  06/18/2022 9:40 AM  Jesse [name]  has presented today for surgery, with the diagnosis of Dysphagia, odynophagia abnormal weight loss iron deficiency anemia status post radiation for lung cancer.  The various methods of treatment have been discussed with the patient and family. After consideration of risks, benefits and other options for treatment, the patient has consented to  Procedure(s) with comments: ESOPHAGOGASTRODUODENOSCOPY (EGD) WITH PROPOFOL (N/A) - Severe dysphagia/odynophagia ?radiation esophagitis versus oral candidiasis as a surgical intervention.  The patient's history has been reviewed, patient examined, no change in status, stable for surgery.  I have reviewed the patient's chart and labs.  Questions were answered to the patient's satisfaction.     Jesse [name]

## 2022-06-18 NOTE — Progress Notes (Signed)
Patient being transferred to another unit, report called to the receiving RN. Attempt to call wife, not successful.

## 2022-06-18 NOTE — Transfer of Care (Signed)
Immediate Anesthesia Transfer of Care Note  Patient: Jesse [name]  Procedure(s) Performed: ESOPHAGOGASTRODUODENOSCOPY (EGD) WITH PROPOFOL ESOPHAGEAL DILATION BIOPSY  Patient Location: PACU and Endoscopy Unit  Anesthesia Type:MAC  Level of Consciousness: oriented, drowsy, and patient cooperative  Airway & Oxygen Therapy: Patient Spontanous Breathing and Patient connected to face mask oxygen  Post-op Assessment: Report given to RN and Post -op Vital signs reviewed and stable  Post vital signs: Reviewed and stable  Last Vitals:  Vitals Value Taken Time  BP 86/55 06/18/22 1026  Temp 36.3 C 06/18/22 1026  Pulse 95 06/18/22 1032  Resp 17 06/18/22 1032  SpO2 100 % 06/18/22 1032  Vitals shown include unvalidated device data.  Last Pain:  Vitals:   06/18/22 1026  TempSrc: Temporal  PainSc: 0-No pain      Patients Stated Pain Goal: 0 ([id number])  Complications: No notable events documented.

## 2022-06-18 NOTE — Care Management Important Message (Signed)
Important Message  Patient Details IM Letter given. Name: Jesse [name] MRN: [id number] Date of Birth: 1950/07/05   Medicare Important Message Given:  Yes     Kerin Salen 06/18/2022, 12:58 PM

## 2022-06-18 NOTE — Progress Notes (Signed)
Pt arrived to alert and oriented x3, oriented to surroundings. Fall precautions continued, tele, sitter in place. Tube feeds continued, pt appears to be tolerating.

## 2022-06-19 ENCOUNTER — Other Ambulatory Visit: Payer: Self-pay

## 2022-06-19 ENCOUNTER — Ambulatory Visit
Admission: RE | Admit: 2022-06-19 | Discharge: 2022-06-19 | Disposition: A | Discharge status: Home / Self Care | Payer: Medicare Other | Source: Ambulatory Visit | Attending: Radiation Oncology | Admitting: Radiation Oncology

## 2022-06-19 DIAGNOSIS — K222 Esophageal obstruction: Secondary | ICD-10-CM | POA: Diagnosis not present

## 2022-06-19 DIAGNOSIS — R1310 Dysphagia, unspecified: Secondary | ICD-10-CM

## 2022-06-19 DIAGNOSIS — F17210 Nicotine dependence, cigarettes, uncomplicated: Secondary | ICD-10-CM | POA: Diagnosis not present

## 2022-06-19 DIAGNOSIS — C771 Secondary and unspecified malignant neoplasm of intrathoracic lymph nodes: Secondary | ICD-10-CM | POA: Diagnosis not present

## 2022-06-19 DIAGNOSIS — A419 Sepsis, unspecified organism: Secondary | ICD-10-CM | POA: Diagnosis not present

## 2022-06-19 DIAGNOSIS — Z510 Encounter for antineoplastic radiation therapy: Secondary | ICD-10-CM | POA: Diagnosis not present

## 2022-06-19 DIAGNOSIS — C3412 Malignant neoplasm of upper lobe, left bronchus or lung: Secondary | ICD-10-CM | POA: Diagnosis not present

## 2022-06-19 DIAGNOSIS — R1319 Other dysphagia: Secondary | ICD-10-CM | POA: Diagnosis not present

## 2022-06-19 DIAGNOSIS — R6520 Severe sepsis without septic shock: Secondary | ICD-10-CM | POA: Diagnosis not present

## 2022-06-19 DIAGNOSIS — K208 Other esophagitis without bleeding: Secondary | ICD-10-CM | POA: Diagnosis not present

## 2022-06-19 LAB — RAD ONC ARIA SESSION SUMMARY
Course Elapsed Days: 35
Plan Fractions Treated to Date: 23
Plan Prescribed Dose Per Fraction: 2 Gy
Plan Total Fractions Prescribed: 30
Plan Total Prescribed Dose: 60 Gy
Reference Point Dosage Given to Date: 46 Gy
Reference Point Session Dosage Given: 2 Gy
Session Number: 23

## 2022-06-19 LAB — SURGICAL PATHOLOGY

## 2022-06-19 LAB — GLUCOSE, CAPILLARY
Glucose-Capillary: 161 mg/dL — ABNORMAL HIGH (ref 70–99)
Glucose-Capillary: 154 mg/dL — ABNORMAL HIGH (ref 70–99)
Glucose-Capillary: 106 mg/dL — ABNORMAL HIGH (ref 70–99)
Glucose-Capillary: 130 mg/dL — ABNORMAL HIGH (ref 70–99)

## 2022-06-19 MED ORDER — OSMOLITE 1.5 CAL PO LIQD
1000.0000 mL | ORAL | Status: DC
  Administered 2022-06-19 – 2022-07-02 (×11): 1000 mL
  Filled 2022-06-19 (×30): qty 1000

## 2022-06-19 NOTE — Progress Notes (Addendum)
Physical Therapy Treatment Patient Details Name: Jesse [name] MRN: [id number] DOB: 1950/11/22 Today's Date: 06/19/2022   History of Present Illness Patient is a 72 year old male with significant hx of lung and bladder CA currently on chemo and radiation treatments, who presented with severe fatigue and confusion, cyanosis and soft BPs. Admitted with sepsis from unspecified organism and PNA R lung. PMH CA, COPD, HLD, lung mass, bladder CA, macular degeneration, hx ileostomy, B knee surgery    PT Comments    Pt able to come to sitting EOB with increased time, going through sidelying to push up into sitting, intermittent use of bedrails as needed. Pt powers up to standing pulling on braced RW, min A from therapist to steady, pt denies dizziness and reports being tired without energy; able to complete 2 STS reps from bed. Pt tolerates static standing ~3 minutes x2 reps with min guard for safety. Pt declines ambulation away from bed but is able to perform static marching for ~15 seconds. Pt asking what tube feed line is and concerned about low energy, attempt to orient pt and encourage him. Pt returns to supine with all needs in reach.   Recommendations for follow up therapy are one component of a multi-disciplinary discharge planning process, led by the attending physician.  Recommendations may be updated based on patient status, additional functional criteria and insurance authorization.  Follow Up Recommendations  Skilled nursing-short term rehab (<3 hours/day) Can patient physically be transported by private vehicle: No   Assistance Recommended at Discharge Frequent or constant Supervision/Assistance  Patient can return home with the following A little help with walking and/or transfers;A little help with bathing/dressing/bathroom;Assistance with cooking/housework;Assist for transportation;Help with stairs or ramp for entrance   Equipment Recommendations  Other (comment) (TBD)     Recommendations for Other Services       Precautions / Restrictions Precautions Precautions: Fall Precaution Comments: watch O2/HR/BPs, NG TF Restrictions Weight Bearing Restrictions: No     Mobility  Bed Mobility Overal bed mobility: Needs Assistance  Rolling: Min guard Sidelying to sit: Min guard  Sit to supine: Min guard  General bed mobility comments: pt min guard to roll to side and push up into sitting EOB needing increased time, min guard to lift BLE back into bed with increased time    Transfers Overall transfer level: Needs assistance Equipment used: Rolling walker (2 wheels) Transfers: Sit to/from Stand Sit to Stand: Min assist  General transfer comment: pt pulling on braced RW, min A to power up to standing, completes 2 reps    Ambulation/Gait  General Gait Details: pt able to perform static standing marching with RW, min A to steady, limited by fatigue   Stairs             Wheelchair Mobility    Modified Rankin (Stroke Patients Only)       Balance Overall balance assessment: Needs assistance Sitting-balance support: Feet supported Sitting balance-[name]: Good Sitting balance - Comments: static sitting EOB   Standing balance support: Reliant on assistive device for balance, Bilateral upper extremity supported, During functional activity Standing balance-[name]: Poor     Cognition Arousal/Alertness: Awake/alert Behavior During Therapy: WFL for tasks assessed/performed Overall Cognitive Status: Impaired/Different from baseline  General Comments: pt asking "what's this tube for" in regards to NG tube feed and stating "I need to know what's wrong with me" in regards to why he is weak; pt able to follow commands with increased time and easily redirected  Exercises      General Comments General comments (skin integrity, edema, etc.): HR 123 max noted with static standing and improves to 90s with return to supine       Pertinent Vitals/Pain Pain Assessment Pain Assessment: No/denies pain    Home Living                          Prior Function            PT Goals (current goals can now be found in the care plan section) Acute Rehab PT Goals Patient Stated Goal: be able to work in garden PT Goal Formulation: With patient Time For Goal Achievement: 06/21/22 Potential to Achieve Goals: Fair Progress towards PT goals: Progressing toward goals    Frequency    Min 2X/week      PT Plan Current plan remains appropriate    Co-evaluation       OT goals addressed during session: ADL's and self-care      AM-PAC PT "6 Clicks" Mobility   Outcome Measure  Help needed turning from your back to your side while in a flat bed without using bedrails?: A Little Help needed moving from lying on your back to sitting on the side of a flat bed without using bedrails?: A Little Help needed moving to and from a bed to a chair (including a wheelchair)?: A Little Help needed standing up from a chair using your arms (e.g., wheelchair or bedside chair)?: A Little Help needed to walk in hospital room?: Total Help needed climbing 3-5 steps with a railing? : Total 6 Click Score: 14    End of Session Equipment Utilized During Treatment: Gait belt Activity Tolerance: Patient tolerated treatment well;Patient limited by fatigue Patient left: in bed;with call bell/phone within reach;with bed alarm set;with nursing/sitter in room Nurse Communication: Mobility status PT Visit Diagnosis: Unsteadiness on feet (R26.81);Difficulty in walking, not elsewhere classified (R26.2);Muscle weakness (generalized) (M62.81)     Time: [id number] PT Time Calculation (min) (ACUTE ONLY): 27 min  Charges:  $Therapeutic Activity: 23-37 mins                      Tori Sayge Brienza PT, DPT 06/19/22, 3:05 PM

## 2022-06-19 NOTE — Progress Notes (Signed)
RN Anderson Malta called to check on pt who is currently in room 1616. RN Lenna Sciara stated that pt is doing well this morning and should be good for treatment this am.  Rn called [facility] to let them know as well.

## 2022-06-19 NOTE — Progress Notes (Signed)
PT Cancellation Note  Patient Details Name: Jesse [name] MRN: [id number] DOB: [birth year]   Cancelled Treatment:    Reason Eval/Treat Not Completed: Patient declined, no reason specified. Pt requesting to try therapy after he eats lunch. Will check back as schedule permits.    Talbot Grumbling PT, DPT 06/19/22, 11:55 AM

## 2022-06-19 NOTE — Progress Notes (Signed)
PROGRESS NOTE Jesse [name]  [id number] DOB: 1950-12-15 DOA: 06/05/2022 PCP: [name], MD  Brief Narrative/Hospital Course: 72 year old male with history of GIST of rectum s/p resection/ileostomy then undergoing ileostomy takedown on 03/22/2022, on imatinib, history of recurrent adenocarcinoma the left lung continue concurrent radiation and chemotherapy presented with confusion, hypoxia, unresponsive initially on presentation, he was found to have sepsis with Klebsiella bacteremia and pneumonia and also a small acute embolic ischemic stroke.  Patient seen by cardiology palliative care neurology and gastroenterology in consultation.  Echo showed slightly reduced EF.  Hospitalization complicated with persistently poor oral intake manage on tube feeding, GI following. EGD done 3/4-diet confirmed radiation esophagitis and a small stricture that was dilated     Subjective: Seen and examined this morning Alert awake but pleasantly confused. Overnight afebrile, BP soft on lower side in 100, on room air No family at the bedside. Assessment and Plan: Principal Problem:   Severe sepsis ([place]) Active Problems:   Dyslipidemia   Essential hypertension   Adenocarcinoma of left lung, stage 2 (HCC)   Malignant neoplasm of lung (HCC)   Hypokalemia   AKI (acute kidney injury) ([place])   Hyponatremia   Acute metabolic encephalopathy   Acute respiratory failure with hypoxia (HCC)   CAP (community acquired pneumonia)   Thrombocytopenia (HCC)   Sepsis ([place])   Diarrhea   Bacteremia due to Klebsiella pneumoniae   Pneumonia of right middle lobe due to Klebsiella pneumoniae ([place])   Drug-induced neutropenia (HCC)   Protein-calorie malnutrition, severe   Esophageal dysphagia   Lower esophageal ring ([place])   Hiatal hernia   Radiation-induced esophagitis   Odynophagia  Odynophagia/dysphagia Oral thrush Anorexia Radiation esophagitis Schatzki Ring Suspected esophageal candidiasis: Having  persistently poor oral intake, had oral thrush which is improved received Diflucan but discontinued subsequently due to interaction with Plavix.  Underwent EGD 3/3  noted "Radiation injury in the mid/upper esophagus from 25-33 cm (biopsied). Benign appearing [place] ring in the lower esophagus, dilated with 18 mm TTS balloon. No clear esophageal candida on this study, but could be due to him being on therapy for a few days now" > GI advised diet and TF.  Continue nocturnal tube feeding, encourage oral intake.   Severe sepsis present on admission due to Klebsiella pneumonia bacteremia and pneumonia: Completed 10 days of Rocephin on 2/21, was seen by ID.  Resolved and vitals stable  Acute metabolic encephalopathy Unresponsive/confused on presentation: Multifactorial, currently alert awake oriented but not a reliable historian.  On TeleSitter   Acute CVAs incidentally found on MRI brain: Seen by neurology thought to be embolic>started on aspirin and Plavix.  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  Seen by cardiology recommended close Zio patch upon discharge  Cardiomyopathy, LVEF 40 to 45%: Incidentally noted on echocardiogram seen by cardiology no further ischemic workup continue current metoprolol prn Lasix.  Monitor volume status.  Currently volume status stable/on dry side continue to monitor net balance and weight as below. Net IO Since Admission: 3,550.25 mL [06/19/22 1152]  Filed Weights   06/17/22 0409 06/18/22 0500 06/18/22 0934  Weight: 59.5 kg 56.8 kg 56.8 kg     Acute on chronic diarrhea: History of anterior resection syndrome.  S/P C DIFF test- C diff + but toxin was negative.GI pathogen panel negative.  cont Questran, contact  precaution, symptomatic management.  Diarrhea improved   AKI:  Resolved    Hypertension: Stable blood pressure on Losartan, metoprolol. Monitor BP  Pancytopenia with neutropenia thrombocytopenia normocytic anemia: Multifactorial in the setting of malignancy, status post filgrastim x 3, WBC normalized platelets stable hemoglobin overall stable FOBT positive on 2/21 : Monitor transfused on 2/25.  Continue to monitor hemoglobin> slightly downtrending transfuse if less than 7 g Recent Labs  Lab 06/13/22 1902 06/15/22 1802 06/18/22 0913 06/18/22 1859  HGB 9.5* 8.8* 8.7* 7.9*  HCT 28.9* 28.7* 26.9* 23.9*     Recurrent adenocarcinoma of the lung (diagnosed in 2021):S/P left upper lobectomy with lymph node dissection on December 16, 2019,repeat video bronchoscopy with EBUS on April 11, 2022 and biopsy from the station 7 lymph node showed recurrent adenocarcinoma.  Followed by Dr. Julien Nordmann, currently was undergoing a course of concurrent chemoradiation with weekly carboplatin for AUC of 2 and paclitaxel 45 Mg/M2. First dose May 14, 2022.  Currently on hold is hospitalized  GIST tumor of rectum -s/p Neoadjuvant treatment with imatinib 400 mg p.o. daily for the GIST tumor of the pelvic area. First dose started July 18, 2020. Status post 12 months of treatment. -s/p  robotic assisted very low anterior rectosigmoid resection with coloanal anastomosis, diverting loop ileostomy and wedge liver biopsy under the care of Dr. Johney Maine on December 14, 2021 and it showed minimal residual gastrointestinal stromal tumor.  -s/p undergoing ileostomy takedown on 03/22/2022  -home meds imatinib held F/u oncology Dr. Julien Nordmann   Deconditioning/debility weakness continue PT OT recommending skilled nursing facility   Goals of care: Complex comorbidities, lung cancer now with cachexia poor appetite recent sick ascites overall prognosis does not appear bright, palliative care has been consulted, remains full code> appreciate palliative care input started  IV morphine 2 mg every 4  hours for his pain 3/4, and viscous lidocaine 3 times daily/nightly.  As per PMT he would not want prolonged artificial life support in the event of terminal condition.  Severe malnutrition continue to augment diet as tolerated with tube feeds/dysphagia 3 Nutrition Problem: Severe Malnutrition Etiology: chronic illness (lung cancer currently undergoing chemotherapy and radiation therapy) Signs/Symptoms: severe fat depletion, severe muscle depletion, percent weight loss (15% in 6 months) Percent weight loss: 15 % (in 6 months) Interventions: Ensure Enlive (each supplement provides 350kcal and 20 grams of protein), Tube feeding    Interventions: Ensure Enlive (each supplement provides 350kcal and 20 grams of protein), Tube feeding Body mass index is 18.49 kg/m.:Will benefit with PCP follow-up, weight loss  healthy lifestyle and outpatient sleep evaluation.  DVT prophylaxis: SCDs Start: 06/06/22 0153 Code Status:   Code Status: Full Code Family Communication: plan of care discussed with patient His wife is not at the bedside today  Patient status is:  INPATIENT because of dysphagia Level of care: Telemetry   Dispo: The patient is from: Home            Anticipated disposition: SNF once p.o. intake is stable-and no longer needing NGT feeding.  Patient does not want permanent feeding tube.  Objective: Vitals last 24 hrs: Vitals:   06/18/22 2018 06/18/22 2325 06/19/22 0330 06/19/22 0735  BP: 110/75 (!) 110/58 113/74 109/68  Pulse: (!) 105 85 (!) 108 100  Resp: '20 20 20 18  '$ Temp: 98.4 F (36.9 C) 98 F (36.7 C) 98 F (36.7 C) 98 F (36.7 C)  TempSrc: Oral Oral Oral   SpO2: 97% 99% 98% 99%  Weight:  Height:       Weight change: 0 kg  Physical Examination: General exam: AA, frail, weak,older appearing. NGT+ HEENT:Oral mucosa moist, Ear/Nose WNL grossly, dentition normal. Respiratory system: bilaterally clear diminished BS, no use of accessory muscle Cardiovascular system: S1  & S2 +, regular rate. Gastrointestinal system: Abdomen soft, NT,ND,BS+ Nervous System:Alert, awake, moving extremities and grossly nonfocal Extremities: LE ankle edema NEG, lower extremities warm Skin: No rashes,no icterus. MSK:  THIN muscle bulk,tone, power   Medications reviewed:  Scheduled Meds:  acetaminophen (TYLENOL) oral liquid 160 mg/5 mL  1,000 mg Oral TID   aspirin  81 mg Per Tube Daily   atorvastatin  20 mg Oral Daily   cholestyramine light  4 g Per Tube BID   clopidogrel  75 mg Per Tube Daily   feeding supplement  237 mL Oral TID BM   feeding supplement (PROSource TF20)  60 mL Per Tube Daily   lidocaine  15 mL Mouth/Throat TID AC & HS   losartan  50 mg Oral Daily   metoprolol tartrate  25 mg Oral BID   mirtazapine  7.5 mg Oral QHS   nystatin  5 mL Oral QID   mouth rinse  15 mL Mouth Rinse 4 times per day   pantoprazole (PROTONIX) IV  40 mg Intravenous Q24H   sucralfate  1 g Oral TID WC & HS   Continuous Infusions:  sodium chloride 10 mL/hr at 06/15/22 2022   feeding supplement (OSMOLITE 1.5 CAL) 60 mL/hr at 06/18/22 1648      Diet Order             DIET DYS 3 Room service appropriate? Yes; Fluid consistency: Thin  Diet effective now                   Intake/Output Summary (Last 24 hours) at 06/19/2022 1152 Last data filed at 06/19/2022 0500 Gross per 24 hour  Intake 180 ml  Output 1150 ml  Net -970 ml   Net IO Since Admission: 3,550.25 mL [06/19/22 1152]  Wt Readings from Last 3 Encounters:  06/18/22 56.8 kg  06/11/22 67.2 kg  06/04/22 61.6 kg     Unresulted Labs (From admission, onward)     Start     Ordered   06/18/22 0500  Phosphorus  Every Monday (0500),   R     Question:  Specimen collection method  Answer:  Lab=Lab collect   06/14/22 1118   06/18/22 [id number]  Basic metabolic panel  Every Mon-Wed-Fri (0500),   R     Question:  Specimen collection method  Answer:  Lab=Lab collect   06/17/22 0812   06/15/22 0500  Magnesium  Every Mon-Wed-Fri  (0500),   R     Question:  Specimen collection method  Answer:  Lab=Lab collect   06/14/22 1118          Data Reviewed: I have personally reviewed following labs and imaging studies CBC: Recent Labs  Lab 06/13/22 1902 06/15/22 1802 06/18/22 0913 06/18/22 1859  WBC 5.0 4.8 3.1* 3.1*  HGB 9.5* 8.8* 8.7* 7.9*  HCT 28.9* 28.7* 26.9* 23.9*  MCV 92.6 98.6 93.4 91.9  PLT 152 202 236 [id number]   Basic Metabolic Panel: Recent Labs  Lab 06/14/22 0349 06/15/22 0439 06/16/22 0341 06/17/22 0701 06/18/22 0543  NA 133* 136 137 134* 135  K 4.5 4.6 4.7 4.5 4.9  CL 105 105 107 107 102  CO2 '24 24 23 23 24  '$ GLUCOSE 125* 113* 123*  134* 109*  BUN 28* 32* 31* 33* 34*  CREATININE 0.56* 0.55* 0.52* 0.41* 0.70  CALCIUM 7.8* 8.4* 8.4* 8.3* 9.0  MG 2.0 1.9  --   --  2.1  PHOS 3.4  --   --   --  5.0*   GFR: Estimated Creatinine Clearance: 67.1 mL/min (by C-G formula based on SCr of 0.7 mg/dL). Liver Function Tests: No results for input(s): "AST", "ALT", "ALKPHOS", "BILITOT", "PROT", "ALBUMIN" in the last 168 hours.  Recent Labs  Lab 06/18/22 1604 06/18/22 2015 06/18/22 2331 06/19/22 0418 06/19/22 0736  GLUCAP 93 145* 143* 161* 154*    No results found for this or any previous visit (from the past 240 hour(s)).  Antimicrobials: Anti-infectives (From admission, onward)    Start     Dose/Rate Route Frequency Ordered Stop   06/15/22 2000  fluconazole (DIFLUCAN) IVPB 200 mg  Status:  Discontinued        200 mg 100 mL/hr over 60 Minutes Intravenous Every 24 hours 06/15/22 1720 06/16/22 1501   06/14/22 1200  cefTRIAXone (ROCEPHIN) 2 g in sodium chloride 0.9 % 100 mL IVPB        2 g 200 mL/hr over 30 Minutes Intravenous Every 24 hours 06/14/22 1116 06/14/22 1725   06/12/22 1230  fluconazole (DIFLUCAN) IVPB 200 mg        200 mg 100 mL/hr over 60 Minutes Intravenous Every 24 hours 06/12/22 1134 06/12/22 1434   06/07/22 1200  vancomycin (VANCOCIN) IVPB 1000 mg/200 mL premix  Status:   Discontinued        1,000 mg 200 mL/hr over 60 Minutes Intravenous Every 36 hours 06/05/22 2323 06/06/22 1108   06/06/22 1200  cefTRIAXone (ROCEPHIN) 2 g in sodium chloride 0.9 % 100 mL IVPB  Status:  Discontinued        2 g 200 mL/hr over 30 Minutes Intravenous Every 24 hours 06/06/22 1110 06/14/22 1116   06/06/22 0200  ceFEPIme (MAXIPIME) 2 g in sodium chloride 0.9 % 100 mL IVPB  Status:  Discontinued        2 g 200 mL/hr over 30 Minutes Intravenous Every 12 hours 06/05/22 2323 06/06/22 1108   06/05/22 2315  vancomycin (VANCOCIN) IVPB 1000 mg/200 mL premix        1,000 mg 200 mL/hr over 60 Minutes Intravenous  Once 06/05/22 2307 06/06/22 0145   06/05/22 1900  cefTRIAXone (ROCEPHIN) 2 g in sodium chloride 0.9 % 100 mL IVPB  Status:  Discontinued        2 g 200 mL/hr over 30 Minutes Intravenous Every 24 hours 06/05/22 1847 06/05/22 2323   06/05/22 1900  azithromycin (ZITHROMAX) 500 mg in sodium chloride 0.9 % 250 mL IVPB  Status:  Discontinued        500 mg 250 mL/hr over 60 Minutes Intravenous Every 24 hours 06/05/22 1847 06/06/22 1108   06/05/22 1830  ceFEPIme (MAXIPIME) 2 g in sodium chloride 0.9 % 100 mL IVPB  Status:  Discontinued        2 g 200 mL/hr over 30 Minutes Intravenous  Once 06/05/22 1820 06/05/22 1847   06/05/22 1830  metroNIDAZOLE (FLAGYL) IVPB 500 mg  Status:  Discontinued        500 mg 100 mL/hr over 60 Minutes Intravenous  Once 06/05/22 1820 06/05/22 1847   06/05/22 1830  vancomycin (VANCOCIN) IVPB 1000 mg/200 mL premix  Status:  Discontinued        1,000 mg 200 mL/hr over 60 Minutes Intravenous  Once 06/05/22 1820 06/05/22 1826   06/05/22 1830  vancomycin (VANCOREADY) IVPB 1500 mg/300 mL  Status:  Discontinued        1,500 mg 150 mL/hr over 120 Minutes Intravenous  Once 06/05/22 1826 06/05/22 1847      Culture/Microbiology    Component Value Date/Time   SDES  06/06/2022 1644    URINE, CLEAN CATCH Performed at [facility], [place] [address], [place], [place] [address]    SPECREQUEST  06/06/2022 1644    NONE Performed at [facility], [place] [address], [place], [place] [address]    CULT  06/06/2022 1644    NO GROWTH Performed at [facility], [place] [address] [place],  [address]    REPTSTATUS 06/07/2022 FINAL 06/06/2022 1644     Radiology Studies: DG Abd Portable 1V  Result Date: 06/18/2022 CLINICAL DATA:  Encounter for nasogastric tube placement. EXAM: PORTABLE ABDOMEN - 1 VIEW COMPARISON:  06/16/2022 FINDINGS: Single view of the abdomen demonstrates a feeding tube that extends down the esophagus and terminates in the left upper abdomen. The tip of the feeding tube is likely in the gastric fundus or body region. Bowel gas in the left upper quadrant of the abdomen. IMPRESSION: Feeding tube tip is likely in the gastric fundus or body region. Electronically Signed   By: Markus Daft M.D.   On: 06/18/2022 14:40     LOS: 14 days   Antonieta Pert, MD [facility]  06/19/2022, 11:52 AM

## 2022-06-19 NOTE — Progress Notes (Signed)
Pathology results:  Biopsies taken from the esophagus during the upper endoscopy yesterday demonstrate exudate and inflammation all consistent with ulcer, and a pattern suggestive of radiation injury.  Otherwise no malignancy.  No change in inpatient management plan based on these results.  Results relayed to inpatient GI team and made available via MyChart as well.   Gerrit Heck, DO, [facility]

## 2022-06-19 NOTE — Plan of Care (Signed)
  Problem: Respiratory: Goal: Ability to maintain adequate ventilation will improve Outcome: Progressing   Problem: Nutrition: Goal: Adequate nutrition will be maintained Outcome: Progressing   Problem: Pain Managment: Goal: General experience of comfort will improve Outcome: Progressing   Problem: Safety: Goal: Ability to remain free from injury will improve Outcome: Progressing   Problem: Skin Integrity: Goal: Risk for impaired skin integrity will decrease Outcome: Progressing

## 2022-06-19 NOTE — Progress Notes (Addendum)
Progress Note   Subjective  Chief Complaint: Odynophagia/GERD/feeding difficulties  Status post EGD 06/18/2022 with erythematous, decreased vascular pattern mucosa in the esophagus which was biopsied, mild Schatzki's ring dilated to 18 mm and a 2 cm hiatal hernia.  At that time recommended resuming a PPI, Nystatin, Carafate and Viscous lidocaine  Today, patient tells me he remains confused.  He has not tried any of his dysphagia 3 diet which is ordered for him.  He has been trying to drink boost/Ensure shakes.  No new complaints in regards to his throat.    Objective   Vital signs in last 24 hours: Temp:  [97.4 F (36.3 C)-98.4 F (36.9 C)] 98 F (36.7 C) (03/05 0735) Pulse Rate:  [85-108] 100 (03/05 0735) Resp:  [18-20] 18 (03/05 0735) BP: (104-113)/(58-75) 109/68 (03/05 0735) SpO2:  [97 %-99 %] 99 % (03/05 0735) Last BM Date : 06/18/22 General:    Ill-appearing, thin, pale white male in NAD Heart:  Regular rate and rhythm; no murmurs Lungs: Respirations even and unlabored, lungs CTA bilaterally Abdomen:  Soft, nontender and nondistended. Normal bowel sounds. Psych:  Cooperative. Normal mood and affect.  Intake/Output from previous day: 03/04 0701 - 03/05 0700 In: 700 [P.O.:20; I.V.:500; NG/GT:180] Out: 1150 [Urine:1150]   Lab Results: Recent Labs    06/18/22 0913 06/18/22 1859  WBC 3.1* 3.1*  HGB 8.7* 7.9*  HCT 26.9* 23.9*  PLT 236 247   BMET Recent Labs    06/17/22 0701 06/18/22 0543  NA 134* 135  K 4.5 4.9  CL 107 102  CO2 23 24  GLUCOSE 134* 109*  BUN 33* 34*  CREATININE 0.41* 0.70  CALCIUM 8.3* 9.0   Studies/Results: DG Abd Portable 1V  Result Date: 06/18/2022 CLINICAL DATA:  Encounter for nasogastric tube placement. EXAM: PORTABLE ABDOMEN - 1 VIEW COMPARISON:  06/16/2022 FINDINGS: Single view of the abdomen demonstrates a feeding tube that extends down the esophagus and terminates in the left upper abdomen. The tip of the feeding tube is likely in  the gastric fundus or body region. Bowel gas in the left upper quadrant of the abdomen. IMPRESSION: Feeding tube tip is likely in the gastric fundus or body region. Electronically Signed   By: Markus Daft M.D.   On: 06/18/2022 14:40     Assessment / Plan:   Assessment: 1.  Odynophagia/GERD/feeding difficulties: With oral candidiasis, history of radiation for lung cancer, EGD 06/18/2022 with Schatzki's ring dilated, patient has not tried his diet yet this morning 2.  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  IDA on supplements 4.  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  History of rectal GIST 6.  CHF 7.  Severe malnutrition  Plan: 1.  Continue current supportive measures including Diflucan for total of 7 to 10 days, Pantoprazole 40 mg daily and Carafate suspension 1 g 3 times daily 2.  Patient will be alerted when we have biopsy results 3.  Encouraged the patient to order off of his dysphagia 3 diet today  From a GI standpoint I am not sure there is much else for [place] to offer at the moment, we will likely sign off.  Please call us back if we can be of any further assistance.   LOS: 14 days   Levin Erp  06/19/2022, 11:09 AM  GI ATTENDING  Interval history and data reviewed.  Endoscopy report reviewed.  Biopsies pending.  Agree with interval progress note as outlined  above.  Supportive care for radiation esophagitis as this will improve over time.  On pantoprazole and Carafate.  Please continue.  Esophageal stricture dilated.  Advance diet as tolerated.  No additional specific recommendations from GI standpoint.  We are available if needed.  Will sign off.  Docia Chuck. Geri Seminole., M.D. [facility] Division of Gastroenterology

## 2022-06-20 ENCOUNTER — Other Ambulatory Visit: Payer: Self-pay | Admitting: Physician Assistant

## 2022-06-20 ENCOUNTER — Ambulatory Visit
Admission: RE | Admit: 2022-06-20 | Discharge: 2022-06-20 | Disposition: A | Discharge status: Home / Self Care | Payer: Medicare Other | Source: Ambulatory Visit | Attending: Radiation Oncology | Admitting: Radiation Oncology

## 2022-06-20 ENCOUNTER — Inpatient Hospital Stay (HOSPITAL_COMMUNITY): Payer: Medicare Other

## 2022-06-20 ENCOUNTER — Other Ambulatory Visit: Payer: Self-pay

## 2022-06-20 DIAGNOSIS — C3490 Malignant neoplasm of unspecified part of unspecified bronchus or lung: Secondary | ICD-10-CM

## 2022-06-20 DIAGNOSIS — C3412 Malignant neoplasm of upper lobe, left bronchus or lung: Secondary | ICD-10-CM | POA: Diagnosis not present

## 2022-06-20 DIAGNOSIS — C771 Secondary and unspecified malignant neoplasm of intrathoracic lymph nodes: Secondary | ICD-10-CM | POA: Diagnosis not present

## 2022-06-20 DIAGNOSIS — R6520 Severe sepsis without septic shock: Secondary | ICD-10-CM | POA: Diagnosis not present

## 2022-06-20 DIAGNOSIS — F17210 Nicotine dependence, cigarettes, uncomplicated: Secondary | ICD-10-CM | POA: Diagnosis not present

## 2022-06-20 DIAGNOSIS — Z510 Encounter for antineoplastic radiation therapy: Secondary | ICD-10-CM | POA: Diagnosis not present

## 2022-06-20 DIAGNOSIS — D702 Other drug-induced agranulocytosis: Secondary | ICD-10-CM

## 2022-06-20 LAB — CBC
HCT: 25.6 % — ABNORMAL LOW (ref 39.0–52.0)
Hemoglobin: 7.9 g/dL — ABNORMAL LOW (ref 13.0–17.0)
MCH: 29.7 pg (ref 26.0–34.0)
MCHC: 30.9 g/dL (ref 30.0–36.0)
MCV: 96.2 fL (ref 80.0–100.0)
Platelets: 291 10*3/uL (ref 150–400)
RBC: 2.66 MIL/uL — ABNORMAL LOW (ref 4.22–5.81)
RDW: 14.3 % (ref 11.5–15.5)
WBC: 2.6 10*3/uL — ABNORMAL LOW (ref 4.0–10.5)
nRBC: 0 % (ref 0.0–0.2)

## 2022-06-20 LAB — RAD ONC ARIA SESSION SUMMARY
Course Elapsed Days: 36
Plan Fractions Treated to Date: 24
Plan Prescribed Dose Per Fraction: 2 Gy
Plan Total Fractions Prescribed: 30
Plan Total Prescribed Dose: 60 Gy
Reference Point Dosage Given to Date: 48 Gy
Reference Point Session Dosage Given: 2 Gy
Session Number: 24

## 2022-06-20 LAB — BASIC METABOLIC PANEL
Anion gap: 7 (ref 5–15)
BUN: 38 mg/dL — ABNORMAL HIGH (ref 8–23)
CO2: 25 mmol/L (ref 22–32)
Calcium: 8.7 mg/dL — ABNORMAL LOW (ref 8.9–10.3)
Chloride: 106 mmol/L (ref 98–111)
Creatinine, Ser: 0.57 mg/dL — ABNORMAL LOW (ref 0.61–1.24)
GFR, Estimated: >60 mL/min (ref >60)
Glucose, Bld: 129 mg/dL — ABNORMAL HIGH (ref 70–99)
Potassium: 4.7 mmol/L (ref 3.5–5.1)
Sodium: 138 mmol/L (ref 135–145)

## 2022-06-20 LAB — GLUCOSE, CAPILLARY
Glucose-Capillary: 120 mg/dL — ABNORMAL HIGH (ref 70–99)
Glucose-Capillary: 142 mg/dL — ABNORMAL HIGH (ref 70–99)
Glucose-Capillary: 157 mg/dL — ABNORMAL HIGH (ref 70–99)
Glucose-Capillary: 168 mg/dL — ABNORMAL HIGH (ref 70–99)

## 2022-06-20 LAB — MAGNESIUM: Magnesium: 1.8 mg/dL (ref 1.7–2.4)

## 2022-06-20 NOTE — Progress Notes (Addendum)
PROGRESS NOTE    Jesse [name]  [id number] DOB: September 18, 1950 DOA: 06/05/2022 PCP: [name], MD   Brief Narrative:  72 year old male with history of GIST of rectum s/p resection/ileostomy with subsequent ileostomy takedown on 03/22/2022, currently on imatinib; history of recurrent adenocarcinoma of left lung currently on concurrent radiation and chemotherapy presented with confusion, hypoxia.  He was found to have sepsis with Klebsiella bacteremia and pneumonia and also a small acute embolic ischemic stroke. Patient seen by cardiology, palliative care, neurology and gastroenterology in consultation. Echo showed slightly reduced EF. Hospitalization complicated with persistently poor oral intake managed with tube feeding, GI following. EGD done 3/4-confirmed radiation esophagitis and a small stricture that was dilated.  Assessment & Plan:   Odynophagia/dysphagia Oral thrush Anorexia Radiation esophagitis Schatzki ring Suspected esophageal candidiasis -Status post EGD on 06/17/2022: Radiation injury in the mid/upper esophagus from 25-33 cm (biopsied). Benign appearing Schatzki ring in the lower esophagus, dilated with 18 mm TTS balloon. No clear esophageal candida on this study, but could be due to him being on therapy for a few days now.  Pathology suggestive of possible radiation injury, no malignancy. -Diflucan had to be discontinued because of interaction with Plavix.  Currently on nystatin. -GI has signed off and recommended Protonix 40 mg daily and Carafate -Oral intake still poor.  Encourage oral intake.  Continue tube feeding diet.  Severe sepsis: Present on admission Klebsiella pneumonia bacteremia and pneumonia -Completed 10 days of Rocephin on 06/06/2022.  ID has signed off.  Sepsis has resolved.  Acute metabolic encephalopathy -Multifactorial.  Improved.  Still slow to respond.  Monitor mental status.  Acute CVA, incidentally found on MRI brain -Seen by neurology thought  to be embolic: started on aspirin and Plavix.  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  Seen by cardiology:recommended Zio patch upon discharge   Cardiomyopathy LVEF 40 to 45%.  No further ischemic workup as per cardiology.  Continue metoprolol and as needed Lasix.  Continue strict input output and daily weights.  Acute on chronic diarrhea -Stool for C. difficile positive but toxin negative.  GI pathogen negative.  Continue Questran, enteric precaution, symptomatic management.  Diarrhea improved.  Hypertension  Hyperlipidemia -Blood pressure stable.  Continue losartan, metoprolol tartrate -Continue statin  Leukopenia -Status post filgrastim x 3 for neutropenia.  WBC 2.6 today.  Anemia of chronic disease -From malignancy and prognosis.  Hemoglobin stable.  Transfused packed red cells on 06/10/2022.  Recurrent adenocarcinoma of the lung -Diagnosed in 2021. -S/P left upper lobectomy with lymph node dissection on December 16, 2019,repeat video bronchoscopy with EBUS on April 11, 2022 and biopsy from the station 7 lymph node showed recurrent adenocarcinoma. Followed by Dr. Julien Nordmann, currently was undergoing a course of concurrent chemoradiation  -Outpatient follow-up with oncology  GIST tumor of rectum -Treated with imatinib  -s/p  robotic assisted very low anterior rectosigmoid resection with coloanal anastomosis, diverting loop ileostomy and wedge liver biopsy under the care of Dr. Johney Maine on December 14, 2021 and it showed minimal residual gastrointestinal stromal tumor.  Status post ileostomy takedown on 03/22/2022 -Imatinib on hold.  Outpatient follow-up with Dr. Julien Nordmann  Debility/physical deconditioning -PT OT recommending SNF placement.  TOC following.  Goals of care -Remains full code.   Palliative care following intermittently.  Severe malnutrition -Oral intake still poor.  Encourage oral intake.  Continue tube feeding for now.  Will check calorie count  DVT prophylaxis: SCDs Code Status: Full Family Communication: Spoke to wife/[name] on phone Disposition Plan: Status is: Inpatient: Because of severity of illness  Consultants: ID/neurology/palliative care/cardiology/gastroenterology  Procedures: As above  Antimicrobials:  Anti-infectives (From admission, onward)    Start     Dose/Rate Route Frequency Ordered Stop   06/15/22 2000  fluconazole (DIFLUCAN) IVPB 200 mg  Status:  Discontinued        200 mg 100 mL/hr over 60 Minutes Intravenous Every 24 hours 06/15/22 1720 06/16/22 1501   06/14/22 1200  cefTRIAXone (ROCEPHIN) 2 g in sodium chloride 0.9 % 100 mL IVPB        2 g 200 mL/hr over 30 Minutes Intravenous Every 24 hours 06/14/22 1116 06/14/22 1725   06/12/22 1230  fluconazole (DIFLUCAN) IVPB 200 mg        200 mg 100 mL/hr over 60 Minutes Intravenous Every 24 hours 06/12/22 1134 06/12/22 1434   06/07/22 1200  vancomycin (VANCOCIN) IVPB 1000 mg/200 mL premix  Status:  Discontinued        1,000 mg 200 mL/hr over 60 Minutes Intravenous Every 36 hours 06/05/22 2323 06/06/22 1108   06/06/22 1200  cefTRIAXone (ROCEPHIN) 2 g in sodium chloride 0.9 % 100 mL IVPB  Status:  Discontinued        2 g 200 mL/hr over 30 Minutes Intravenous Every 24 hours 06/06/22 1110 06/14/22 1116   06/06/22 0200  ceFEPIme (MAXIPIME) 2 g in sodium chloride 0.9 % 100 mL IVPB  Status:  Discontinued        2 g 200 mL/hr over 30 Minutes Intravenous Every 12 hours 06/05/22 2323 06/06/22 1108   06/05/22 2315  vancomycin (VANCOCIN) IVPB 1000 mg/200 mL premix        1,000 mg 200 mL/hr over 60 Minutes Intravenous  Once 06/05/22 2307 06/06/22 0145   06/05/22 1900  cefTRIAXone (ROCEPHIN) 2 g in sodium chloride 0.9 % 100 mL IVPB  Status:  Discontinued        2 g 200 mL/hr over 30 Minutes Intravenous  Every 24 hours 06/05/22 1847 06/05/22 2323   06/05/22 1900  azithromycin (ZITHROMAX) 500 mg in sodium chloride 0.9 % 250 mL IVPB  Status:  Discontinued        500 mg 250 mL/hr over 60 Minutes Intravenous Every 24 hours 06/05/22 1847 06/06/22 1108   06/05/22 1830  ceFEPIme (MAXIPIME) 2 g in sodium chloride 0.9 % 100 mL IVPB  Status:  Discontinued        2 g 200 mL/hr over 30 Minutes Intravenous  Once 06/05/22 1820 06/05/22 1847   06/05/22 1830  metroNIDAZOLE (FLAGYL) IVPB 500 mg  Status:  Discontinued        500 mg 100 mL/hr over 60 Minutes Intravenous  Once 06/05/22 1820 06/05/22 1847   06/05/22 1830  vancomycin (VANCOCIN) IVPB 1000 mg/200 mL premix  Status:  Discontinued        1,000 mg 200 mL/hr over 60 Minutes Intravenous  Once 06/05/22 1820 06/05/22 1826   06/05/22 1830  vancomycin (VANCOREADY) IVPB 1500 mg/300 mL  Status:  Discontinued        1,500 mg 150 mL/hr over 120 Minutes Intravenous  Once 06/05/22 1826 06/05/22 1847        Subjective: Patient seen and examined at bedside.  No overnight fever, seizures, vomiting reported.  Oral intake is still poor as per nursing staff.  Objective:  Vitals:   06/19/22 1359 06/19/22 2020 06/20/22 0410 06/20/22 0429  BP: 105/73 112/68 106/60   Pulse: (!) 104 (!) 106 (!) 102   Resp: '18 18 17   '$ Temp: 98.2 F (36.8 C) 98 F (36.7 C) 98.3 F (36.8 C)   TempSrc:  Oral Oral   SpO2: 97% 97% 99%   Weight:    58.7 kg  Height:        Intake/Output Summary (Last 24 hours) at 06/20/2022 1157 Last data filed at 06/20/2022 0427 Gross per 24 hour  Intake 2521.25 ml  Output 1200 ml  Net 1321.25 ml   Filed Weights   06/18/22 0500 06/18/22 0934 06/20/22 0429  Weight: 56.8 kg 56.8 kg 58.7 kg    Examination:  General exam: Appears calm and comfortable.  Looks chronically ill and deconditioned.  On room air. ENT: NG tube present. Respiratory system: Bilateral decreased breath sounds at bases with scattered crackles Cardiovascular system: S1 &  S2 heard, Rate controlled Gastrointestinal system: Abdomen is nondistended, soft and nontender. Normal bowel sounds heard. Extremities: No cyanosis, clubbing; trace lower extremity edema present Central nervous system: Alert and awake.  Slow to respond.  Poor historian.  No focal neurological deficits. Moving extremities Skin: No rashes, lesions or ulcers Psychiatry: Flat affect.  Not agitated.   Data Reviewed: I have personally reviewed following labs and imaging studies  CBC: Recent Labs  Lab 06/13/22 1902 06/15/22 1802 06/18/22 0913 06/18/22 1859 06/20/22 0613  WBC 5.0 4.8 3.1* 3.1* 2.6*  HGB 9.5* 8.8* 8.7* 7.9* 7.9*  HCT 28.9* 28.7* 26.9* 23.9* 25.6*  MCV 92.6 98.6 93.4 91.9 96.2  PLT 152 202 236 247 [id number]   Basic Metabolic Panel: Recent Labs  Lab 06/14/22 0349 06/15/22 0439 06/16/22 0341 06/17/22 0701 06/18/22 0543 06/20/22 0613  NA 133* 136 137 134* 135 138  K 4.5 4.6 4.7 4.5 4.9 4.7  CL 105 105 107 107 102 106  CO2 '24 24 23 23 24 25  '$ GLUCOSE 125* 113* 123* 134* 109* 129*  BUN 28* 32* 31* 33* 34* 38*  CREATININE 0.56* 0.55* 0.52* 0.41* 0.70 0.57*  CALCIUM 7.8* 8.4* 8.4* 8.3* 9.0 8.7*  MG 2.0 1.9  --   --  2.1 1.8  PHOS 3.4  --   --   --  5.0*  --    GFR: Estimated Creatinine Clearance: 69.3 mL/min (A) (by C-G formula based on SCr of 0.57 mg/dL (L)). Liver Function Tests: No results for input(s): "AST", "ALT", "ALKPHOS", "BILITOT", "PROT", "ALBUMIN" in the last 168 hours. No results for input(s): "LIPASE", "AMYLASE" in the last 168 hours. No results for input(s): "AMMONIA" in the last 168 hours. Coagulation Profile: No results for input(s): "INR", "PROTIME" in the last 168 hours. Cardiac Enzymes: No results for input(s): "CKTOTAL", "CKMB", "CKMBINDEX", "TROPONINI" in the last 168 hours. BNP (last 3 results) No results for input(s): "PROBNP" in the last 8760 hours. HbA1C: No results for input(s): "HGBA1C" in the last 72 hours. CBG: Recent Labs  Lab  06/19/22 1155 06/19/22 2016 06/20/22 0009 06/20/22 0405 06/20/22 0816  GLUCAP 106* 130* 168* 157* 142*   Lipid Profile: No results for input(s): "CHOL", "HDL", "LDLCALC", "TRIG", "CHOLHDL", "LDLDIRECT" in the last 72 hours. Thyroid Function Tests: No results for input(s): "TSH", "T4TOTAL", "FREET4", "T3FREE", "THYROIDAB" in the last 72 hours. Anemia Panel: No results for input(s): "VITAMINB12", "FOLATE", "FERRITIN", "TIBC", "IRON", "RETICCTPCT" in the last 72 hours. Sepsis Labs: No results for input(s): "PROCALCITON", "LATICACIDVEN" in the last 168 hours.  No results found for this or any previous visit (from the past 240 hour(s)).       Radiology Studies: DG Abd Portable 1V  Result Date: 06/18/2022 CLINICAL DATA:  Encounter for nasogastric tube placement. EXAM: PORTABLE ABDOMEN - 1 VIEW COMPARISON:  06/16/2022 FINDINGS: Single view of the abdomen demonstrates a feeding tube that extends down the esophagus and terminates in the left upper abdomen. The tip of the feeding tube is likely in the gastric fundus or body region. Bowel gas in the left upper quadrant of the abdomen. IMPRESSION: Feeding tube tip is likely in the gastric fundus or body region. Electronically Signed   By: Markus Daft M.D.   On: 06/18/2022 14:40        Scheduled Meds:  acetaminophen (TYLENOL) oral liquid 160 mg/5 mL  1,000 mg Oral TID   aspirin  81 mg Per Tube Daily   atorvastatin  20 mg Oral Daily   cholestyramine light  4 g Per Tube BID   clopidogrel  75 mg Per Tube Daily   feeding supplement  237 mL Oral TID BM   feeding supplement (PROSource TF20)  60 mL Per Tube Daily   lidocaine  15 mL Mouth/Throat TID AC & HS   losartan  50 mg Oral Daily   metoprolol tartrate  25 mg Oral BID   mirtazapine  7.5 mg Oral QHS   nystatin  5 mL Oral QID   mouth rinse  15 mL Mouth Rinse 4 times per day   pantoprazole (PROTONIX) IV  40 mg Intravenous Q24H   sucralfate  1 g Oral TID WC & HS   Continuous Infusions:   sodium chloride 10 mL/hr at 06/15/22 2022   feeding supplement (OSMOLITE 1.5 CAL) Stopped (06/20/22 [id number])          Aline August, MD [facility] 06/20/2022, 11:57 AM

## 2022-06-20 NOTE — Progress Notes (Signed)
Occupational Therapy Treatment Patient Details Name: Jesse [name] MRN: [id number] DOB: 1950/07/01 Today's Date: 06/20/2022   History of present illness Patient is a 72 year old male with significant hx of lung and bladder CA currently on chemo and radiation treatments, who presented with severe fatigue and confusion, cyanosis and soft BPs. Admitted with sepsis from unspecified organism and PNA R lung. PMH CA, COPD, HLD, lung mass, bladder CA, macular degeneration, hx ileostomy, B knee surgery   OT comments  Patient able to stand for perianal care and then stand to wash his hands at the sink before resting in recliner. He is tolerating more activity but still quite limited. Continue to recommend short term rehab at discharge.    Recommendations for follow up therapy are one component of a multi-disciplinary discharge planning process, led by the attending physician.  Recommendations may be updated based on patient status, additional functional criteria and insurance authorization.    Follow Up Recommendations  Skilled nursing-short term rehab (<3 hours/day)     Assistance Recommended at Discharge Frequent or constant Supervision/Assistance  Patient can return home with the following  A lot of help with walking and/or transfers;A lot of help with bathing/dressing/bathroom;Assistance with cooking/housework;Direct supervision/assist for medications management;Assist for transportation;Help with stairs or ramp for entrance   Equipment Recommendations  BSC/3in1    Recommendations for Other Services      Precautions / Restrictions Precautions Precautions: Fall Precaution Comments: watch O2/HR/BPs, NG TF Restrictions Weight Bearing Restrictions: No       Mobility Bed Mobility Overal bed mobility: Needs Assistance Bed Mobility: Supine to Sit     Supine to sit: Min assist, HOB elevated     General bed mobility comments: Assist for trunk to transfer into sitting.     Transfers Overall transfer level: Needs assistance Equipment used: Rolling walker (2 wheels) Transfers: Sit to/from Stand Sit to Stand: Min assist     Step pivot transfers: Min guard     General transfer comment: Min assist to rise from bed and recliner but min guard to take steps with walker to chair and to sink.     Balance Overall balance assessment: Needs assistance Sitting-balance support: No upper extremity supported, Feet supported Sitting balance-[name]: Fair     Standing balance support: During functional activity, Reliant on assistive device for balance Standing balance-[name]: Poor                             ADL either performed or assessed with clinical judgement   ADL Overall ADL's : Needs assistance/impaired     Grooming: Standing;Min guard Grooming Details (indicate cue type and reason): propped his elbows on the sink counter to wash his hands at the faucet                     Toileting- Clothing Manipulation and Hygiene: Total assistance;Sit to/from stand Toileting - Clothing Manipulation Details (indicate cue type and reason): found to be incontinent of BM and required total assist for perianal care and management of clothing. Hospital gown had to be changed. Once again patient unaware     Functional mobility during ADLs: Min guard;Rolling walker (2 wheels)      Extremity/Trunk Assessment Upper Extremity Assessment Upper Extremity Assessment: Overall WFL for tasks assessed   Lower Extremity Assessment Lower Extremity Assessment: Defer to PT evaluation   Cervical / Trunk Assessment Cervical / Trunk Assessment: Kyphotic    Vision  Patient Visual Report: Central vision impairment Vision Assessment?: No apparent visual deficits   Perception     Praxis      Cognition Arousal/Alertness: Awake/alert Behavior During Therapy: WFL for tasks assessed/performed Overall Cognitive Status: Within Functional Limits for  tasks assessed                                 General Comments: Improved cognition today. Able to follow commands.        Exercises      Shoulder Instructions       General Comments      Pertinent Vitals/ Pain       Pain Assessment Pain Assessment: No/denies pain  Home Living                                          Prior Functioning/Environment              Frequency  Min 2X/week        Progress Toward Goals  OT Goals(current goals can now be found in the care plan section)  Progress towards OT goals: Progressing toward goals  Acute Rehab OT Goals Patient Stated Goal: go to rehab OT Goal Formulation: With patient Time For Goal Achievement: 06/21/22 Potential to Achieve Goals: [place] Discharge plan remains appropriate    Co-evaluation          OT goals addressed during session: ADL's and self-care      AM-PAC OT "6 Clicks" Daily Activity     Outcome Measure   Help from another person eating meals?: None Help from another person taking care of personal grooming?: A Little Help from another person toileting, which includes using toliet, bedpan, or urinal?: Total Help from another person bathing (including washing, rinsing, drying)?: A Lot Help from another person to put on and taking off regular upper body clothing?: A Little Help from another person to put on and taking off regular lower body clothing?: A Lot 6 Click Score: 15    End of Session Equipment Utilized During Treatment: Rolling walker (2 wheels)  OT Visit Diagnosis: Other abnormalities of gait and mobility (R26.89);Unsteadiness on feet (R26.81);Muscle weakness (generalized) (M62.81);Other symptoms and signs involving cognitive function;Pain   Activity Tolerance Patient limited by fatigue   Patient Left with call bell/phone within reach;in chair;with chair alarm set   Nurse Communication Mobility status        Time: [id number] OT Time  Calculation (min): 23 min  Charges: OT General Charges $OT Visit: 1 Visit OT Treatments $Self Care/Home Management : 23-37 mins  Gustavo Lah, OTR/L [place]  Office [phone]   Jesse [name] 06/20/2022, 3:44 PM

## 2022-06-20 NOTE — Progress Notes (Signed)
Nutrition Follow-up  DOCUMENTATION CODES:   Severe malnutrition in context of chronic illness  INTERVENTION:  - DYS 3 diet.  -Ensure Plus High Protein po TID, each supplement provides 350 kcal and 20 grams of protein.  - Pt's wife also bringing in Premier Protein from home, provides 160 kcals and 30g protein per bottle.   - Continue cyclic tube feed of 16 hours (off 8 hours during the day) to help promote better appetite and encourage intake: Osmolite 1.5 @ 71m/hr x16 hours (1600-0800) via NGT (verified in gastric fundus per MD read of xray) 60 ml Prosource TF daily -Tube feeding regimen provides 2120 kcals, 105 grams of protein, and 1036 ml of H2O.    - Once patient begins showing more consistent and improved oral intake (solid foods and consistent acceptance of nutrition supplements), can consider running calorie count at that time.   - Will monitor intake closely.   - Monitor weight trends.    NUTRITION DIAGNOSIS:   Severe Malnutrition related to chronic illness (lung cancer currently undergoing chemotherapy and radiation therapy) as evidenced by severe fat depletion, severe muscle depletion, percent weight loss (15% in 6 months). *onoging  GOAL:   Patient will meet greater than or equal to 90% of their needs *unmet orally, met with TF  MONITOR:   PO intake, Diet advancement, Labs, Weight trends, TF tolerance  REASON FOR ASSESSMENT:   Consult Assessment of nutrition requirement/status, Calorie Count  ASSESSMENT:   72y.o. male with medical history significant of lung cancer currently undergoing chemotherapy and radiation therapy, HTN, HLD, history of bladder cancer, GERD, hypokalemia, recurrent anemia, COPD, macular degeneration who presented with severe fatigue and confusion.  2/20 -admitted 2/22- NGT removed by pt, TF held 2/2 vomiting, NGT replaced (10 french small bore feeding tube- weighted feeding catheter tip in distal stomach per KUB) 2/23- s/po BSE-  advanced to dysphagia 3 diet with thin liquids 2/24- TF re-started 2/26- Calorie count  2/29 2nd calorie count 3/4 TF transition to 16 hour cyclic overnight ([id number] to promote better appetite 3/6 3rd calorie count ordered  Third calorie count order placed today. Patient has already completed two other calorie counts, results below: Calorie Count ONE; Day 1 (2/26):  Total intake (all from supplements): 700 kcal (36% of minimum estimated needs)  40 protein (36% of minimum estimated needs) Day 2 (2/27): Total intake (all from supplements): 1050kcal (56% of minimum estimated needs)  60 protein (55% of minimum estimated needs)  Calorie Count TWO: Day 1 (2/29): Total intake (from supplements): 520 kcal (26% of minimum estimated needs)  30 protein (27% of minimum estimated needs) Day 2 (3/1): Total intake (from supplements): 805 kcal (41% of minimum estimated needs)  60 protein (54% of minimum estimated needs)  It should be noted there has been hardly any meal documentation for calorie counts. All calculated calories and protein from nutrition supplements (Ensure Plus High Protein or Premier Protein brought in by wife).  Patient continues to eat very poorly. Has poor appetite and skips most meals. Wife continues to encourage intake.   He remains on cyclic TF from [id number] support needs.  However, it has been documented several times that patient does not want a permanent feeding tube.   Discussed patient with MD. Reviewed that two previous calorie counts indicated patient meeting ~20-50% but only consuming nutrition supplements and very little else. His intake of supplements also varies daily. Have not seen very much change in intake since last calorie count completed.  Per discussion with MD, will discontinue calorie count for now but keep a close eye on patient's intake and acceptance of supplements. Will patient more time to start increasing oral intake, especially as it  has only been 1 day since TF were changed to cyclic to promote better appetite.  Once patient begins showing improved oral intake, will run calorie count at that time.   Medications reviewed and include: Remeron, Carafate,   Labs reviewed:  -  Diet Order:   Diet Order             DIET DYS 3 Room service appropriate? Yes; Fluid consistency: Thin  Diet effective now                  EDUCATION NEEDS:    Education needs have been addressed  Skin:  Skin Assessment: Reviewed RN Assessment  Last BM:  3/5  Height:  Ht Readings from Last 1 Encounters:  06/18/22 '5\' 9"'$  (1.753 m)   Weight:  Wt Readings from Last 1 Encounters:  06/20/22 58.7 kg    BMI:  Body mass index is 19.11 kg/m.  Estimated Nutritional Needs:  Kcal:  1950-2300 kcals Protein:  110-130 grams Fluid:  >/= 1.9L    Samson Frederic RD, LDN For contact information, refer to [facility].

## 2022-06-20 NOTE — Progress Notes (Signed)
Rn Anderson Malta called Melissa RN on [place] to make sure pt was stable for treatment today. She stated that the pt was doing well and would be good to go for treatment. Email sent to L1 to inform them as well.

## 2022-06-21 ENCOUNTER — Telehealth: Payer: Self-pay | Admitting: Internal Medicine

## 2022-06-21 ENCOUNTER — Other Ambulatory Visit: Payer: Self-pay

## 2022-06-21 ENCOUNTER — Ambulatory Visit
Admission: RE | Admit: 2022-06-21 | Discharge: 2022-06-21 | Disposition: A | Discharge status: Home / Self Care | Payer: Medicare Other | Source: Ambulatory Visit | Attending: Radiation Oncology | Admitting: Radiation Oncology

## 2022-06-21 ENCOUNTER — Encounter (HOSPITAL_COMMUNITY): Payer: Self-pay | Admitting: Gastroenterology

## 2022-06-21 DIAGNOSIS — R7881 Bacteremia: Secondary | ICD-10-CM | POA: Diagnosis not present

## 2022-06-21 DIAGNOSIS — C771 Secondary and unspecified malignant neoplasm of intrathoracic lymph nodes: Secondary | ICD-10-CM | POA: Diagnosis not present

## 2022-06-21 DIAGNOSIS — F17210 Nicotine dependence, cigarettes, uncomplicated: Secondary | ICD-10-CM | POA: Diagnosis not present

## 2022-06-21 DIAGNOSIS — R6520 Severe sepsis without septic shock: Secondary | ICD-10-CM | POA: Diagnosis not present

## 2022-06-21 DIAGNOSIS — C3412 Malignant neoplasm of upper lobe, left bronchus or lung: Secondary | ICD-10-CM | POA: Diagnosis not present

## 2022-06-21 DIAGNOSIS — Z515 Encounter for palliative care: Secondary | ICD-10-CM | POA: Diagnosis not present

## 2022-06-21 DIAGNOSIS — B961 Klebsiella pneumoniae [K. pneumoniae] as the cause of diseases classified elsewhere: Secondary | ICD-10-CM | POA: Diagnosis not present

## 2022-06-21 DIAGNOSIS — K208 Other esophagitis without bleeding: Secondary | ICD-10-CM | POA: Diagnosis not present

## 2022-06-21 DIAGNOSIS — T66XXXA Radiation sickness, unspecified, initial encounter: Secondary | ICD-10-CM | POA: Diagnosis not present

## 2022-06-21 DIAGNOSIS — Z510 Encounter for antineoplastic radiation therapy: Secondary | ICD-10-CM | POA: Diagnosis not present

## 2022-06-21 DIAGNOSIS — R1310 Dysphagia, unspecified: Secondary | ICD-10-CM | POA: Diagnosis not present

## 2022-06-21 DIAGNOSIS — C3492 Malignant neoplasm of unspecified part of left bronchus or lung: Secondary | ICD-10-CM | POA: Diagnosis not present

## 2022-06-21 LAB — RAD ONC ARIA SESSION SUMMARY
Course Elapsed Days: 37
Plan Fractions Treated to Date: 25
Plan Prescribed Dose Per Fraction: 2 Gy
Plan Total Fractions Prescribed: 30
Plan Total Prescribed Dose: 60 Gy
Reference Point Dosage Given to Date: 50 Gy
Reference Point Session Dosage Given: 2 Gy
Session Number: 25

## 2022-06-21 NOTE — Progress Notes (Addendum)
RN Anderson Malta called Jenny Reichmann RN to verify that pt was stable for radiation treatment today. She stated the pt was stable and good to go for treatment today. Anderson Malta RN notified L2.

## 2022-06-21 NOTE — Care Management Important Message (Signed)
Important Message  Patient Details IM Letter given. Name: Jesse [name] MRN: [id number] Date of Birth: [birth year]   Medicare Important Message Given:  Yes     Kerin Salen 06/21/2022, 9:53 AM

## 2022-06-21 NOTE — Progress Notes (Signed)
PROGRESS NOTE    Jesse [name]  [id number] DOB: December 17, 1950 DOA: 06/05/2022 PCP: [name], MD   Brief Narrative:  72 year old male with history of GIST of rectum s/p resection/ileostomy with subsequent ileostomy takedown on 03/22/2022, currently on imatinib; history of recurrent adenocarcinoma of left lung currently on concurrent radiation and chemotherapy presented with confusion, hypoxia.  He was found to have sepsis with Klebsiella bacteremia and pneumonia and also a small acute embolic ischemic stroke. Patient seen by cardiology, palliative care, neurology and gastroenterology in consultation. Echo showed slightly reduced EF. Hospitalization complicated with persistently poor oral intake managed with tube feeding, GI following. EGD done 3/4-confirmed radiation esophagitis and a small stricture that was dilated.  Assessment & Plan:   Odynophagia/dysphagia Oral thrush Anorexia Radiation esophagitis Schatzki ring Suspected esophageal candidiasis -Status post EGD on 06/17/2022: Radiation injury in the mid/upper esophagus from 25-33 cm (biopsied). Benign appearing Schatzki ring in the lower esophagus, dilated with 18 mm TTS balloon. No clear esophageal candida on this study, but could be due to him being on therapy for a few days now.  Pathology suggestive of possible radiation injury, no malignancy. -Diflucan had to be discontinued because of interaction with Plavix.  Currently on nystatin. -GI has signed off and recommended Protonix 40 mg daily and Carafate -Oral intake still poor.  Encourage oral intake.  Continue tube feeding diet.  Dietitian following.  Severe sepsis: Present on admission Klebsiella pneumonia bacteremia and pneumonia -Completed 10 days of Rocephin on 06/06/2022.  ID has signed off.  Sepsis has resolved.  Acute metabolic encephalopathy -Multifactorial.  Improved.  Still slow to respond.  Monitor mental status.  Acute CVA, incidentally found on MRI  brain -Seen by neurology thought to be embolic: started on aspirin and Plavix.  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  Seen by cardiology:recommended Zio patch upon discharge   Cardiomyopathy -LVEF 40 to 45%.  No further ischemic workup as per cardiology.  Continue metoprolol and as needed Lasix.  Continue strict input output and daily weights.  Acute on chronic diarrhea -Stool for C. difficile positive but toxin negative.  GI pathogen negative.  Continue Questran, enteric precaution, symptomatic management.  Diarrhea improved.  Hypertension  Hyperlipidemia -Blood pressure stable.  Continue losartan, metoprolol tartrate -Continue statin  Leukopenia -Status post filgrastim x 3 for neutropenia.  WBC 2.6 on 06/20/2022.  No labs today.  Anemia of chronic disease -From malignancy and prognosis.  Hemoglobin stable.  Transfused packed red cells on 06/10/2022.  Recurrent adenocarcinoma of the lung -Diagnosed in 2021. -S/P left upper lobectomy with lymph node dissection on December 16, 2019,repeat video bronchoscopy with EBUS on April 11, 2022 and biopsy from the station 7 lymph node showed recurrent adenocarcinoma. Followed by Dr. Julien Nordmann, currently was undergoing a course of concurrent chemoradiation  -Outpatient follow-up with oncology  GIST tumor of rectum -Treated with imatinib  -s/p  robotic assisted very low anterior rectosigmoid resection with coloanal anastomosis, diverting loop ileostomy and wedge liver biopsy under the care of Dr. Johney Maine on December 14, 2021 and it showed minimal residual gastrointestinal stromal tumor.  Status post ileostomy takedown on 03/22/2022 -Imatinib on hold.  Outpatient follow-up with Dr. Julien Nordmann  Debility/physical deconditioning -PT OT recommending SNF placement.  TOC  following.  Goals of care -Remains full code.  Palliative care following intermittently.  Severe malnutrition -Oral intake still poor.  Encourage oral intake.  Continue tube feeding for now.  Dietitian following.  DVT prophylaxis: SCDs Code Status: Full Family Communication: Spoke to wife/[name] on phone on 06/20/2022 Disposition Plan: Status is: Inpatient: Because of severity of illness  Consultants: ID/neurology/palliative care/cardiology/gastroenterology  Procedures: As above  Antimicrobials:  Anti-infectives (From admission, onward)    Start     Dose/Rate Route Frequency Ordered Stop   06/15/22 2000  fluconazole (DIFLUCAN) IVPB 200 mg  Status:  Discontinued        200 mg 100 mL/hr over 60 Minutes Intravenous Every 24 hours 06/15/22 1720 06/16/22 1501   06/14/22 1200  cefTRIAXone (ROCEPHIN) 2 g in sodium chloride 0.9 % 100 mL IVPB        2 g 200 mL/hr over 30 Minutes Intravenous Every 24 hours 06/14/22 1116 06/14/22 1725   06/12/22 1230  fluconazole (DIFLUCAN) IVPB 200 mg        200 mg 100 mL/hr over 60 Minutes Intravenous Every 24 hours 06/12/22 1134 06/12/22 1434   06/07/22 1200  vancomycin (VANCOCIN) IVPB 1000 mg/200 mL premix  Status:  Discontinued        1,000 mg 200 mL/hr over 60 Minutes Intravenous Every 36 hours 06/05/22 2323 06/06/22 1108   06/06/22 1200  cefTRIAXone (ROCEPHIN) 2 g in sodium chloride 0.9 % 100 mL IVPB  Status:  Discontinued        2 g 200 mL/hr over 30 Minutes Intravenous Every 24 hours 06/06/22 1110 06/14/22 1116   06/06/22 0200  ceFEPIme (MAXIPIME) 2 g in sodium chloride 0.9 % 100 mL IVPB  Status:  Discontinued        2 g 200 mL/hr over 30 Minutes Intravenous Every 12 hours 06/05/22 2323 06/06/22 1108   06/05/22 2315  vancomycin (VANCOCIN) IVPB 1000 mg/200 mL premix        1,000 mg 200 mL/hr over 60 Minutes Intravenous  Once 06/05/22 2307 06/06/22 0145   06/05/22 1900  cefTRIAXone (ROCEPHIN) 2 g in sodium chloride 0.9 % 100 mL IVPB  Status:   Discontinued        2 g 200 mL/hr over 30 Minutes Intravenous Every 24 hours 06/05/22 1847 06/05/22 2323   06/05/22 1900  azithromycin (ZITHROMAX) 500 mg in sodium chloride 0.9 % 250 mL IVPB  Status:  Discontinued        500 mg 250 mL/hr over 60 Minutes Intravenous Every 24 hours 06/05/22 1847 06/06/22 1108   06/05/22 1830  ceFEPIme (MAXIPIME) 2 g in sodium chloride 0.9 % 100 mL IVPB  Status:  Discontinued        2 g 200 mL/hr over 30 Minutes Intravenous  Once 06/05/22 1820 06/05/22 1847   06/05/22 1830  metroNIDAZOLE (FLAGYL) IVPB 500 mg  Status:  Discontinued        500 mg 100 mL/hr over 60 Minutes Intravenous  Once 06/05/22 1820 06/05/22 1847   06/05/22 1830  vancomycin (VANCOCIN) IVPB 1000 mg/200 mL premix  Status:  Discontinued        1,000 mg 200 mL/hr over 60 Minutes Intravenous  Once 06/05/22 1820 06/05/22 1826   06/05/22 1830  vancomycin (VANCOREADY) IVPB 1500 mg/300 mL  Status:  Discontinued        1,500 mg 150 mL/hr over 120 Minutes Intravenous  Once 06/05/22 1826 06/05/22 1847        Subjective: Patient seen and examined at bedside.  No seizures, agitation, fever or vomiting reported.  Oral intake remains  poor as per nursing staff. Objective: Vitals:   06/20/22 0429 06/20/22 1449 06/20/22 2037 06/21/22 0649  BP:  104/70 108/66 124/73  Pulse:  (!) 109 (!) 110 (!) 118  Resp:  '17 18 18  '$ Temp:  (!) 97.5 F (36.4 C) (!) 97.4 F (36.3 C) 97.8 F (36.6 C)  TempSrc:  Oral Oral Oral  SpO2:  95% 98% 98%  Weight: 58.7 kg     Height:        Intake/Output Summary (Last 24 hours) at 06/21/2022 0802 Last data filed at 06/21/2022 0656 Gross per 24 hour  Intake 117 ml  Output 1250 ml  Net -1133 ml    Filed Weights   06/18/22 0500 06/18/22 0934 06/20/22 0429  Weight: 56.8 kg 56.8 kg 58.7 kg    Examination:  General: Still on room air.  No distress.  Chronically ill and deconditioned looking ENT/neck: No thyromegaly.  JVD is not elevated.  NG tube present. respiratory:  Decreased breath sounds at bases bilaterally with some crackles; no wheezing  CVS: S1-S2 heard, mild intermittent tachycardia present  abdominal: Soft, nontender, slightly distended; no organomegaly, bowel sounds are heard Extremities: Trace lower extremity edema; no cyanosis  CNS: Awake and alert.  Still slow to respond.  Poor historian.  No focal neurologic deficit.  Moves extremities Lymph: No obvious lymphadenopathy Skin: No obvious ecchymosis/lesions  psych: Extremely flat affect.  Currently not agitated.   Musculoskeletal: No obvious joint swelling/deformity    Data Reviewed: I have personally reviewed following labs and imaging studies  CBC: Recent Labs  Lab 06/15/22 1802 06/18/22 0913 06/18/22 1859 06/20/22 0613  WBC 4.8 3.1* 3.1* 2.6*  HGB 8.8* 8.7* 7.9* 7.9*  HCT 28.7* 26.9* 23.9* 25.6*  MCV 98.6 93.4 91.9 96.2  PLT 202 236 247 [id number]    Basic Metabolic Panel: Recent Labs  Lab 06/15/22 0439 06/16/22 0341 06/17/22 0701 06/18/22 0543 06/20/22 0613  NA 136 137 134* 135 138  K 4.6 4.7 4.5 4.9 4.7  CL 105 107 107 102 106  CO2 '24 23 23 24 25  '$ GLUCOSE 113* 123* 134* 109* 129*  BUN 32* 31* 33* 34* 38*  CREATININE 0.55* 0.52* 0.41* 0.70 0.57*  CALCIUM 8.4* 8.4* 8.3* 9.0 8.7*  MG 1.9  --   --  2.1 1.8  PHOS  --   --   --  5.0*  --     GFR: Estimated Creatinine Clearance: 69.3 mL/min (A) (by C-G formula based on SCr of 0.57 mg/dL (L)). Liver Function Tests: No results for input(s): "AST", "ALT", "ALKPHOS", "BILITOT", "PROT", "ALBUMIN" in the last 168 hours. No results for input(s): "LIPASE", "AMYLASE" in the last 168 hours. No results for input(s): "AMMONIA" in the last 168 hours. Coagulation Profile: No results for input(s): "INR", "PROTIME" in the last 168 hours. Cardiac Enzymes: No results for input(s): "CKTOTAL", "CKMB", "CKMBINDEX", "TROPONINI" in the last 168 hours. BNP (last 3 results) No results for input(s): "PROBNP" in the last 8760 hours. HbA1C: No  results for input(s): "HGBA1C" in the last 72 hours. CBG: Recent Labs  Lab 06/19/22 2016 06/20/22 0009 06/20/22 0405 06/20/22 0816 06/20/22 1203  GLUCAP 130* 168* 157* 142* 120*    Lipid Profile: No results for input(s): "CHOL", "HDL", "LDLCALC", "TRIG", "CHOLHDL", "LDLDIRECT" in the last 72 hours. Thyroid Function Tests: No results for input(s): "TSH", "T4TOTAL", "FREET4", "T3FREE", "THYROIDAB" in the last 72 hours. Anemia Panel: No results for input(s): "VITAMINB12", "FOLATE", "FERRITIN", "TIBC", "IRON", "RETICCTPCT" in the last 72 hours. Sepsis  Labs: No results for input(s): "PROCALCITON", "LATICACIDVEN" in the last 168 hours.  No results found for this or any previous visit (from the past 240 hour(s)).       Radiology Studies: DG Abd 1 View  Result Date: 06/20/2022 CLINICAL DATA:  Feeding tube placement EXAM: ABDOMEN - 1 VIEW COMPARISON:  06/18/2022 FINDINGS: The feeding tube tip is in the stomach antrum, oriented towards the pylorus. The lung bases appear clear. Dextroconvex lumbar scoliosis with rotary component. Thoracolumbar spondylosis. IMPRESSION: 1. Feeding tube tip is in the stomach antrum, oriented towards the pylorus. Electronically Signed   By: Van Clines M.D.   On: 06/20/2022 15:30        Scheduled Meds:  acetaminophen (TYLENOL) oral liquid 160 mg/5 mL  1,000 mg Oral TID   aspirin  81 mg Per Tube Daily   atorvastatin  20 mg Oral Daily   cholestyramine light  4 g Per Tube BID   clopidogrel  75 mg Per Tube Daily   feeding supplement  237 mL Oral TID BM   feeding supplement (PROSource TF20)  60 mL Per Tube Daily   lidocaine  15 mL Mouth/Throat TID AC & HS   losartan  50 mg Oral Daily   metoprolol tartrate  25 mg Oral BID   mirtazapine  7.5 mg Oral QHS   nystatin  5 mL Oral QID   mouth rinse  15 mL Mouth Rinse 4 times per day   pantoprazole (PROTONIX) IV  40 mg Intravenous Q24H   sucralfate  1 g Oral TID WC & HS   Continuous Infusions:  sodium  chloride 10 mL/hr at 06/15/22 2022   feeding supplement (OSMOLITE 1.5 CAL) 1,000 mL (06/20/22 1915)          Aline August, MD [facility] 06/21/2022, 8:02 AM

## 2022-06-21 NOTE — Progress Notes (Signed)
Physical Therapy Treatment Patient Details Name: Jesse [name] MRN: [id number] DOB: [birth year] Today's Date: 06/21/2022   History of Present Illness Patient is a 72 year old male with significant hx of lung and bladder CA currently on chemo and radiation treatments, who presented with severe fatigue and confusion, cyanosis and soft BPs. Admitted with sepsis from unspecified organism and PNA R lung. PMH CA, COPD, HLD, lung mass, bladder CA, macular degeneration, hx ileostomy, B knee surgery    PT Comments    Th e patient is  alert and able to follow directions, conversive and appropriate. Patient improving in functional mobility, now requires on 1 min assist. Continue PT for  progressive mobility.   Recommendations for follow up therapy are one component of a multi-disciplinary discharge planning process, led by the attending physician.  Recommendations may be updated based on patient status, additional functional criteria and insurance authorization.  Follow Up Recommendations  Skilled nursing-short term rehab (<3 hours/day) Can patient physically be transported by private vehicle: No   Assistance Recommended at Discharge Frequent or constant Supervision/Assistance  Patient can return home with the following A little help with walking and/or transfers;A little help with bathing/dressing/bathroom;Assistance with cooking/housework;Assist for transportation;Help with stairs or ramp for entrance   Equipment Recommendations       Recommendations for Other Services       Precautions / Restrictions Precautions Precautions: Fall Precaution Comments: watch /HR,  TF     Mobility  Bed Mobility Overal bed mobility: Needs Assistance Bed Mobility: Supine to Sit, Sit to Supine     Supine to sit: Min assist Sit to supine: Min assist   General bed mobility comments: Assist for trunk to transfer into sitting. very little support for legs back onto bed    Transfers Overall transfer level:  Needs assistance Equipment used: Rolling walker (2 wheels) Transfers: Sit to/from Stand Sit to Stand: Min assist           General transfer comment: Min assist to rise from bed and  window seat    Ambulation/Gait Ambulation/Gait assistance: Min assist Gait Distance (Feet): 20 Feet (x 2) Assistive device: Rolling walker (2 wheels) Gait Pattern/deviations: Step-through pattern, Staggering right, Staggering left Gait velocity: decr     General Gait Details: steady assist  with turns in room   Stairs             Wheelchair Mobility    Modified Rankin (Stroke Patients Only)       Balance Overall balance assessment: Needs assistance Sitting-balance support: No upper extremity supported, Feet supported Sitting balance-[name]: Fair     Standing balance support: During functional activity, Reliant on assistive device for balance, Bilateral upper extremity supported Standing balance-[name]: Poor                              Cognition Arousal/Alertness: Awake/alert Behavior During Therapy: WFL for tasks assessed/performed Overall Cognitive Status: Within Functional Limits for tasks assessed                                 General Comments: Improved cognition today. Able to follow commands. gives information about where he lives, has dogs and cats. Quite appropriate conversation        Exercises General Exercises - Lower Extremity Long Arc Quad: Both, 10 reps, Seated Hip Flexion/Marching: Both, 10 reps, Seated, Standing  General Comments        Pertinent Vitals/Pain Pain Assessment Faces Pain Scale: No hurt    Home Living                          Prior Function            PT Goals (current goals can now be found in the care plan section) Acute Rehab PT Goals Patient Stated Goal: be able to work in garden PT Goal Formulation: With patient Time For Goal Achievement: 07/05/22 Potential to Achieve  Goals: Good Progress towards PT goals: Progressing toward goals (goals updated)    Frequency    Min 2X/week      PT Plan Current plan remains appropriate    Co-evaluation              AM-PAC PT "6 Clicks" Mobility   Outcome Measure  Help needed turning from your back to your side while in a flat bed without using bedrails?: A Little Help needed moving from lying on your back to sitting on the side of a flat bed without using bedrails?: A Little Help needed moving to and from a bed to a chair (including a wheelchair)?: A Little Help needed standing up from a chair using your arms (e.g., wheelchair or bedside chair)?: A Little Help needed to walk in hospital room?: A Little Help needed climbing 3-5 steps with a railing? : Total 6 Click Score: 16    End of Session Equipment Utilized During Treatment: Gait belt Activity Tolerance: Patient tolerated treatment well Patient left: in bed;with call bell/phone within reach;with bed alarm set;with nursing/sitter in room Nurse Communication: Mobility status PT Visit Diagnosis: Unsteadiness on feet (R26.81);Difficulty in walking, not elsewhere classified (R26.2);Muscle weakness (generalized) (M62.81)     Time: [id number] PT Time Calculation (min) (ACUTE ONLY): 27 min  Charges:  $Gait Training: 8-22 mins $Therapeutic Exercise: 8-22 mins                     [place] Office [phone] Weekend [id number]    Claretha Cooper 06/21/2022, 4:23 PM

## 2022-06-21 NOTE — Telephone Encounter (Signed)
Scheduled per 03/06 scheduled message, called and left a voicemail.

## 2022-06-21 NOTE — Progress Notes (Signed)
   06/21/22 0649  Assess: MEWS Score  Temp 97.8 F (36.6 C)  BP 124/73  MAP (mmHg) 88  Pulse Rate (!) 118  Resp 18  SpO2 98 %  O2 Device Room Air  Assess: MEWS Score  MEWS Temp 0  MEWS Systolic 0  MEWS Pulse 2  MEWS RR 0  MEWS LOC 0  MEWS Score 2  MEWS Score Color Yellow  Assess: if the MEWS score is Yellow or Red  Were vital signs taken at a resting state? Yes  Focused Assessment Change from prior assessment (see assessment flowsheet) (slightly more tachy, but has been this high before)  Does the patient meet 2 or more of the SIRS criteria? No  Does the patient have a confirmed or suspected source of infection? Yes  Provider and Rapid Response Notified? No  MEWS guidelines implemented  Yes, yellow  Treat  MEWS Interventions Considered administering scheduled or prn medications/treatments as ordered  Take Vital Signs  Increase Vital Sign Frequency  Yellow: Q2hr x1, continue Q4hrs until patient remains green for 12hrs  Escalate  MEWS: Escalate Yellow: Discuss with charge nurse and consider notifying provider and/or RRT  Notify: Charge Nurse/RN  Name of Charge Nurse/RN Notified Renita Mayhan RN  Assess: SIRS CRITERIA  SIRS Temperature  0  SIRS Pulse 1  SIRS Respirations  0  SIRS WBC 0  SIRS Score Sum  1

## 2022-06-21 NOTE — Progress Notes (Signed)
Daily Progress Note   Patient Name: Jesse [name]       Date: 06/21/2022 DOB: 31-Oct-1950  Age: 72 y.o. MRN#: [id number] Attending Physician: Aline August, MD Primary Care Physician: [name], MD Admit Date: 06/05/2022  Reason for Consultation/Follow-up:   "lung cancer patient presented with with bacteremia.has very poor intake ,on tube feed,very deconditioned,weak.Quality of life is very poor. Needs goals of care"   Patient Profile/HPI: 72 y.o. male  with past medical history of GIST tumor of pelvic area (dx 12/2019) - s/p resection and loop ileostomy for fecal diversion 12/14/21 and takedown on 03/22/22 on Gleevec; [place] lung cancer dx June 2021 with positive mediastinal lymph node December 2023 currently on concurrent chemoradiation tx with carbo/paclitaxel admitted on 06/05/2022 with sepsis d/t bacteremia- psuedomonas klebsiella after being very weak and unresponsive at home. Further workup revealed multiple scattered embolic strokes- workup in progress to determine cause. He has had poor po intake and significant weight loss- cortrak tube feeding is in place.       Subjective: Chart reviewed including labs, progress notes, imaging from this and previous encounters. He is taking in po, but it is low. Dietician doing calorie counts On eval Jim sleeping.  Called and spoke to his wife, Jeannene Patella. [name] had a rough day yesterday with confusion, but was better today. She plans to bring him his favorite food tomorrow.  I recommended trial of Boost Soothe as well.  Plan continues for full scope, full code.    Physical Exam Vitals and nursing note reviewed.  Constitutional:      Comments: frail  Pulmonary:     Effort: Pulmonary effort is normal.  Neurological:     Mental Status: He is alert.      Comments: sleeping             Vital Signs: BP 114/73 (BP Location: Right Arm)   Pulse 95   Temp 99 F (37.2 C) (Oral)   Resp 18   Ht '5\' 9"'$  (1.753 m)   Wt 58.7 kg   SpO2 99%   BMI 19.11 kg/m  SpO2: SpO2: 99 % O2 Device: O2 Device: Room Air O2 Flow Rate: O2 Flow Rate (L/min): 3 L/min  Intake/output summary:  Intake/Output Summary (Last 24 hours) at 06/21/2022 1643 Last data filed at 06/21/2022 0656 Gross per  24 hour  Intake --  Output 1250 ml  Net -1250 ml    LBM: Last BM Date : 06/20/22 Baseline Weight: Weight: 65.7 kg Most recent weight: Weight: 58.7 kg       Palliative Assessment/Data: PPS: 30%      Patient Active Problem List   Diagnosis Date Noted   Esophageal dysphagia 06/18/2022   Esophageal stricture 06/18/2022   Hiatal hernia 06/18/2022   Radiation esophagitis 06/18/2022   Odynophagia 06/18/2022   Drug-induced neutropenia ([place]) 06/07/2022   Protein-calorie malnutrition, severe 06/07/2022   Bacteremia due to Klebsiella pneumoniae 06/06/2022   Pneumonia of right middle lobe due to Klebsiella pneumoniae ([place]) 06/06/2022   Severe sepsis ([place]) 06/05/2022   Hypokalemia 06/05/2022   AKI (acute kidney injury) ([place]) 06/05/2022   Hyponatremia 0000000   Acute metabolic encephalopathy 0000000   Acute respiratory failure with hypoxia ([place]) 06/05/2022   CAP (community acquired pneumonia) 06/05/2022   Thrombocytopenia ([place]) 06/05/2022   Sepsis ([place]) 06/05/2022   Diarrhea 06/05/2022   Weight loss 05/28/2022   Malignant neoplasm of lung ([place]) 04/30/2022   Adenopathy 04/06/2022   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[place]) 08/21/2021   Encounter for antineoplastic chemotherapy 01/28/2020   Adenocarcinoma of left lung, stage 2 ([place]) 12/31/2019   Goals of care, counseling/discussion 12/31/2019   S/P lobectomy of lung 12/16/2019   Lung nodule 10/13/2019   Essential hypertension 09/28/2016   Coronary artery calcification 09/28/2016    Impaired glucose tolerance 09/28/2016   NEOPLASM, MALIGNANT, BLADDER, HX OF 07/18/2007   Vitamin D deficiency 06/03/2007   DIVERTICULOSIS, COLON 01/14/2007   Osteoarthritis 01/14/2007   Dyslipidemia 01/09/2007   GERD 01/09/2007   REFLUX ESOPHAGITIS 06/01/2004   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    Palliative Care Assessment & Plan    Assessment/Recommendations/Plan  Continue current plan of care Advance have been scanned into [employer] Try Boost Soothe- spouse to order and bring Recommend bland foods, small, frequent meals  Code Status: Full code  Prognosis:  Unable to determine  Discharge Planning: To Be Determined  Care plan was discussed with patient's spouse and care team.   Thank you for allowing the Palliative Medicine Team to assist in the care of this patient.  Total time: 40 minutes  Greater than 50%  of this time was spent counseling and coordinating care related to the above assessment and plan.  Mariana Kaufman, AGNP-C Palliative Medicine   Please contact Palliative Medicine Team phone at [phone] for questions and concerns.

## 2022-06-22 ENCOUNTER — Ambulatory Visit
Admission: RE | Admit: 2022-06-22 | Discharge: 2022-06-22 | Disposition: A | Discharge status: Home / Self Care | Payer: Medicare Other | Source: Ambulatory Visit | Attending: Radiation Oncology | Admitting: Radiation Oncology

## 2022-06-22 ENCOUNTER — Other Ambulatory Visit: Payer: Self-pay

## 2022-06-22 DIAGNOSIS — Z510 Encounter for antineoplastic radiation therapy: Secondary | ICD-10-CM | POA: Diagnosis not present

## 2022-06-22 DIAGNOSIS — C3412 Malignant neoplasm of upper lobe, left bronchus or lung: Secondary | ICD-10-CM | POA: Diagnosis not present

## 2022-06-22 DIAGNOSIS — R6520 Severe sepsis without septic shock: Secondary | ICD-10-CM | POA: Diagnosis not present

## 2022-06-22 DIAGNOSIS — C771 Secondary and unspecified malignant neoplasm of intrathoracic lymph nodes: Secondary | ICD-10-CM | POA: Diagnosis not present

## 2022-06-22 DIAGNOSIS — F17210 Nicotine dependence, cigarettes, uncomplicated: Secondary | ICD-10-CM | POA: Diagnosis not present

## 2022-06-22 LAB — BASIC METABOLIC PANEL
Anion gap: 10 (ref 5–15)
BUN: 37 mg/dL — ABNORMAL HIGH (ref 8–23)
CO2: 23 mmol/L (ref 22–32)
Calcium: 9.1 mg/dL (ref 8.9–10.3)
Chloride: 105 mmol/L (ref 98–111)
Creatinine, Ser: 0.49 mg/dL — ABNORMAL LOW (ref 0.61–1.24)
GFR, Estimated: >60 mL/min (ref >60)
Glucose, Bld: 134 mg/dL — ABNORMAL HIGH (ref 70–99)
Potassium: 4.2 mmol/L (ref 3.5–5.1)
Sodium: 138 mmol/L (ref 135–145)

## 2022-06-22 LAB — RAD ONC ARIA SESSION SUMMARY
Course Elapsed Days: 38
Plan Fractions Treated to Date: 26
Plan Prescribed Dose Per Fraction: 2 Gy
Plan Total Fractions Prescribed: 30
Plan Total Prescribed Dose: 60 Gy
Reference Point Dosage Given to Date: 52 Gy
Reference Point Session Dosage Given: 2 Gy
Session Number: 26

## 2022-06-22 LAB — MAGNESIUM: Magnesium: 1.8 mg/dL (ref 1.7–2.4)

## 2022-06-22 MED ORDER — HYDROCODONE-ACETAMINOPHEN 5-325 MG PO TABS
1.0000 | ORAL_TABLET | ORAL | Status: DC | PRN
  Administered 2022-06-23 – 2022-06-26 (×4): 1 via ORAL
  Administered 2022-06-27 – 2022-07-04 (×2): 2 via ORAL
  Filled 2022-06-22: qty 1
  Filled 2022-06-22 (×4): qty 2
  Filled 2022-06-22 (×2): qty 1

## 2022-06-22 MED ORDER — ASPIRIN 81 MG PO CHEW
81.0000 mg | CHEWABLE_TABLET | Freq: Every day | ORAL | Status: DC
  Administered 2022-06-23 – 2022-07-06 (×12): 81 mg via ORAL
  Filled 2022-06-22 (×13): qty 1

## 2022-06-22 MED ORDER — CHOLESTYRAMINE LIGHT 4 G PO PACK
4.0000 g | PACK | Freq: Two times a day (BID) | ORAL | Status: DC
  Filled 2022-06-22: qty 1

## 2022-06-22 MED ORDER — ACETAMINOPHEN 650 MG RE SUPP: 650.0000 mg | Freq: Four times a day (QID) | RECTAL | Status: DC | PRN

## 2022-06-22 MED ORDER — ACETAMINOPHEN 160 MG/5ML PO SOLN
650.0000 mg | Freq: Four times a day (QID) | ORAL | Status: DC | PRN
  Administered 2022-06-28 – 2022-07-01 (×2): 650 mg via ORAL
  Filled 2022-06-22 (×2): qty 20.3

## 2022-06-22 MED ORDER — CHOLESTYRAMINE LIGHT 4 G PO PACK
4.0000 g | PACK | Freq: Four times a day (QID) | ORAL | Status: DC
  Administered 2022-06-22 – 2022-07-06 (×47): 4 g via ORAL
  Filled 2022-06-22 (×57): qty 1

## 2022-06-22 MED ORDER — CLOPIDOGREL BISULFATE 75 MG PO TABS
75.0000 mg | ORAL_TABLET | Freq: Every day | ORAL | Status: DC
  Administered 2022-06-23 – 2022-06-26 (×4): 75 mg via ORAL
  Filled 2022-06-22 (×4): qty 1

## 2022-06-22 NOTE — Progress Notes (Signed)
Mobility Specialist - Progress Note   06/22/22 1517  Mobility  Activity Stood at bedside;Transferred from bed to chair;Transferred from chair to bed  Level of Assistance Contact guard assist, steadying assist  Assistive Device Front wheel walker  Distance Ambulated (ft) 5 ft  Activity Response Tolerated well  Mobility Referral Yes  $Mobility charge 1 Mobility   Pt received in bed and agreeable to mobility. Once standing pt was soiled & c/o feeling weak. Suggested pt transfer to recliner to assist w/ cleaning/ replacing linen with the help of the nurse. Pt requested assistance back to bed once linen was put back on bed & voiced he would be interested in ambulating tomorrow (3/9). No other complaints during session. Pt to bed after session w/ all needs met, nurse & speech therapy in room.    [facility]

## 2022-06-22 NOTE — Progress Notes (Signed)
Patient has been able to tolerate PO meds. Asked Dr Remi Haggard if patient can change to all PO meds.

## 2022-06-22 NOTE — Progress Notes (Signed)
Brief Nutrition Note  Met with patient and wife at bedside.   RN in room reports patient ate 50% of his eggs, some yogurt, and a few sips of Premier Protein this morning, which is a major improvement from the previous days. Patient reports he is feeling a little more hungry. Wife brought in a breakfast burrito for patient to eat at lunch. He is documented to have had 30%.   Discussed patient with attending Dr. Starla Link. Patient has shown improvement in intake today but would like to see more consistency in meal intakes. Plan to see how patient eats through the weekend and if he continues to eat better will run another calorie count early next week.   Samson Frederic RD, LDN For contact information, refer to [facility].

## 2022-06-22 NOTE — TOC Progression Note (Signed)
**Note Jesse-Identified via Obfuscation** Transition of Care [facility]) - Progression Note    Patient Details  Name: Jesse [name] MRN: [id number] Date of Birth: 1950/07/04  Transition of Care [facility]) CM/SW [place], RN Phone [phone]  06/22/2022, 2:39 PM  Clinical Narrative:    TOC continues to follow for SNF recommendations. Currently not SNF appropriate due to inadequate intake with cortrack.   Expected Discharge Plan: [place] Barriers to Discharge: Continued Medical Work up  Expected Discharge Plan and Services In-house Referral: NA, Nutrition Discharge Planning Services: CM Consult Post Acute Care Choice: [place] Living arrangements for the past 2 months: Single Family Home                 DME Arranged: N/A DME Agency: NA       HH Arranged: NA HH Agency: NA         Social Determinants of Health (SDOH) Interventions SDOH Screenings   Food Insecurity: No Food Insecurity (06/06/2022)  Housing: Low Risk  (06/06/2022)  Transportation Needs: No Transportation Needs (06/06/2022)  Utilities: Not At Risk (06/06/2022)  Depression (PHQ2-9): Low Risk  (05/07/2022)  Financial Resource Strain: Low Risk  (07/18/2021)  Physical Activity: Sufficiently Active (07/18/2021)  Social Connections: Moderately Isolated (07/12/2020)  Stress: No Stress Concern Present (07/18/2021)  Tobacco Use: Medium Risk (06/21/2022)    Readmission Risk Interventions    06/11/2022    5:42 PM  Readmission Risk Prevention Plan  Transportation Screening Complete  PCP or Specialist Appt within 3-5 Days Complete  HRI or [place] Complete  Social Work Consult for [place] Planning/Counseling Complete  Palliative Care Screening Not Applicable  Medication Review Press photographer) Complete

## 2022-06-22 NOTE — Progress Notes (Signed)
PROGRESS NOTE    Jesse [name]  [id number] DOB: December 17, 1950 DOA: 06/05/2022 PCP: [name], MD   Brief Narrative:  72 year old male with history of GIST of rectum s/p resection/ileostomy with subsequent ileostomy takedown on 03/22/2022, currently on imatinib; history of recurrent adenocarcinoma of left lung currently on concurrent radiation and chemotherapy presented with confusion, hypoxia.  He was found to have sepsis with Klebsiella bacteremia and pneumonia and also a small acute embolic ischemic stroke. Patient seen by cardiology, palliative care, neurology and gastroenterology in consultation. Echo showed slightly reduced EF. Hospitalization complicated with persistently poor oral intake managed with tube feeding, GI following. EGD done 3/4-confirmed radiation esophagitis and a small stricture that was dilated.  Assessment & Plan:   Odynophagia/dysphagia Oral thrush Anorexia Radiation esophagitis Schatzki ring Suspected esophageal candidiasis -Status post EGD on 06/17/2022: Radiation injury in the mid/upper esophagus from 25-33 cm (biopsied). Benign appearing Schatzki ring in the lower esophagus, dilated with 18 mm TTS balloon. No clear esophageal candida on this study, but could be due to him being on therapy for a few days now.  Pathology suggestive of possible radiation injury, no malignancy. -Diflucan had to be discontinued because of interaction with Plavix.  Currently on nystatin. -GI has signed off and recommended Protonix 40 mg daily and Carafate -Oral intake still poor.  Encourage oral intake.  Continue tube feeding diet.  Dietitian following.  Severe sepsis: Present on admission Klebsiella pneumonia bacteremia and pneumonia -Completed 10 days of Rocephin on 06/06/2022.  ID has signed off.  Sepsis has resolved.  Acute metabolic encephalopathy -Multifactorial.  Improved.  Still slow to respond.  Monitor mental status.  Acute CVA, incidentally found on MRI  brain -Seen by neurology thought to be embolic: started on aspirin and Plavix.  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  Seen by cardiology:recommended Zio patch upon discharge   Cardiomyopathy -LVEF 40 to 45%.  No further ischemic workup as per cardiology.  Continue metoprolol and as needed Lasix.  Continue strict input output and daily weights.  Acute on chronic diarrhea -Stool for C. difficile positive but toxin negative.  GI pathogen negative.  Continue Questran, enteric precaution, symptomatic management.  Diarrhea improved.  Hypertension  Hyperlipidemia -Blood pressure stable.  Continue losartan, metoprolol tartrate -Continue statin  Leukopenia -Status post filgrastim x 3 for neutropenia.  WBC 2.6 on 06/20/2022.  No labs today.  Anemia of chronic disease -From malignancy and prognosis.  Hemoglobin stable.  Transfused packed red cells on 06/10/2022.  Recurrent adenocarcinoma of the lung -Diagnosed in 2021. -S/P left upper lobectomy with lymph node dissection on December 16, 2019,repeat video bronchoscopy with EBUS on April 11, 2022 and biopsy from the station 7 lymph node showed recurrent adenocarcinoma. Followed by Dr. Julien Nordmann, currently was undergoing a course of concurrent chemoradiation  -Outpatient follow-up with oncology  GIST tumor of rectum -Treated with imatinib  -s/p  robotic assisted very low anterior rectosigmoid resection with coloanal anastomosis, diverting loop ileostomy and wedge liver biopsy under the care of Dr. Johney Maine on December 14, 2021 and it showed minimal residual gastrointestinal stromal tumor.  Status post ileostomy takedown on 03/22/2022 -Imatinib on hold.  Outpatient follow-up with Dr. Julien Nordmann  Debility/physical deconditioning -PT OT recommending SNF placement.  TOC  following.  Goals of care -Remains full code.  Palliative care following intermittently.  Severe malnutrition -Oral intake still poor.  Encourage oral intake.  Continue tube feeding for now.  Dietitian following.  DVT prophylaxis: SCDs Code Status: Full Family Communication: Spoke to wife/[name] on phone on 06/20/2022 Disposition Plan: Status is: Inpatient: Because of severity of illness  Consultants: ID/neurology/palliative care/cardiology/gastroenterology  Procedures: As above  Antimicrobials:  Anti-infectives (From admission, onward)    Start     Dose/Rate Route Frequency Ordered Stop   06/15/22 2000  fluconazole (DIFLUCAN) IVPB 200 mg  Status:  Discontinued        200 mg 100 mL/hr over 60 Minutes Intravenous Every 24 hours 06/15/22 1720 06/16/22 1501   06/14/22 1200  cefTRIAXone (ROCEPHIN) 2 g in sodium chloride 0.9 % 100 mL IVPB        2 g 200 mL/hr over 30 Minutes Intravenous Every 24 hours 06/14/22 1116 06/14/22 1725   06/12/22 1230  fluconazole (DIFLUCAN) IVPB 200 mg        200 mg 100 mL/hr over 60 Minutes Intravenous Every 24 hours 06/12/22 1134 06/12/22 1434   06/07/22 1200  vancomycin (VANCOCIN) IVPB 1000 mg/200 mL premix  Status:  Discontinued        1,000 mg 200 mL/hr over 60 Minutes Intravenous Every 36 hours 06/05/22 2323 06/06/22 1108   06/06/22 1200  cefTRIAXone (ROCEPHIN) 2 g in sodium chloride 0.9 % 100 mL IVPB  Status:  Discontinued        2 g 200 mL/hr over 30 Minutes Intravenous Every 24 hours 06/06/22 1110 06/14/22 1116   06/06/22 0200  ceFEPIme (MAXIPIME) 2 g in sodium chloride 0.9 % 100 mL IVPB  Status:  Discontinued        2 g 200 mL/hr over 30 Minutes Intravenous Every 12 hours 06/05/22 2323 06/06/22 1108   06/05/22 2315  vancomycin (VANCOCIN) IVPB 1000 mg/200 mL premix        1,000 mg 200 mL/hr over 60 Minutes Intravenous  Once 06/05/22 2307 06/06/22 0145   06/05/22 1900  cefTRIAXone (ROCEPHIN) 2 g in sodium chloride 0.9 % 100 mL IVPB  Status:   Discontinued        2 g 200 mL/hr over 30 Minutes Intravenous Every 24 hours 06/05/22 1847 06/05/22 2323   06/05/22 1900  azithromycin (ZITHROMAX) 500 mg in sodium chloride 0.9 % 250 mL IVPB  Status:  Discontinued        500 mg 250 mL/hr over 60 Minutes Intravenous Every 24 hours 06/05/22 1847 06/06/22 1108   06/05/22 1830  ceFEPIme (MAXIPIME) 2 g in sodium chloride 0.9 % 100 mL IVPB  Status:  Discontinued        2 g 200 mL/hr over 30 Minutes Intravenous  Once 06/05/22 1820 06/05/22 1847   06/05/22 1830  metroNIDAZOLE (FLAGYL) IVPB 500 mg  Status:  Discontinued        500 mg 100 mL/hr over 60 Minutes Intravenous  Once 06/05/22 1820 06/05/22 1847   06/05/22 1830  vancomycin (VANCOCIN) IVPB 1000 mg/200 mL premix  Status:  Discontinued        1,000 mg 200 mL/hr over 60 Minutes Intravenous  Once 06/05/22 1820 06/05/22 1826   06/05/22 1830  vancomycin (VANCOREADY) IVPB 1500 mg/300 mL  Status:  Discontinued        1,500 mg 150 mL/hr over 120 Minutes Intravenous  Once 06/05/22 1826 06/05/22 1847        Subjective: Patient seen and examined at bedside.  No vomiting, fever, agitation or seizures reported.  Oral intake is  still poor.   Objective: Vitals:   06/21/22 0649 06/21/22 1450 06/21/22 2027 06/22/22 0510  BP: 124/73 114/73 117/64 122/68  Pulse: (!) 118 95 (!) 106 (!) 109  Resp: '18 18 18 18  '$ Temp: 97.8 F (36.6 C) 99 F (37.2 C) 98.2 F (36.8 C) 98.1 F (36.7 C)  TempSrc: Oral Oral Oral Oral  SpO2: 98% 99% 98% 98%  Weight:      Height:        Intake/Output Summary (Last 24 hours) at 06/22/2022 0816 Last data filed at 06/22/2022 0349 Gross per 24 hour  Intake 200 ml  Output 650 ml  Net -450 ml    Filed Weights   06/18/22 0500 06/18/22 0934 06/20/22 0429  Weight: 56.8 kg 56.8 kg 58.7 kg    Examination:  General: No acute distress.  On room air currently.  Chronically ill and deconditioned looking ENT/neck: NG tube is present.   Respiratory: Bilateral decreased breath  sounds at bases with scattered crackles  CVS: Tachycardic intermittently; S1 and S2 are heard  abdominal: Soft, nontender, distended mildly; no organomegaly, normal bowel sounds heard  extremities: No clubbing; mild lower extremity edema present CNS: Alert and awake.  Still extremely slow to respond.  Poor historian.  No focal neurologic deficit.  Able to move extremities Lymph: No palpable lymphadenopathy noted Skin: No obvious rashes/petechiae psych: Not agitated currently; flat affect mostly Musculoskeletal: No obvious joint swelling/deformity    Data Reviewed: I have personally reviewed following labs and imaging studies  CBC: Recent Labs  Lab 06/15/22 1802 06/18/22 0913 06/18/22 1859 06/20/22 0613  WBC 4.8 3.1* 3.1* 2.6*  HGB 8.8* 8.7* 7.9* 7.9*  HCT 28.7* 26.9* 23.9* 25.6*  MCV 98.6 93.4 91.9 96.2  PLT 202 236 247 [id number]    Basic Metabolic Panel: Recent Labs  Lab 06/16/22 0341 06/17/22 0701 06/18/22 0543 06/20/22 0613 06/22/22 0528  NA 137 134* 135 138 138  K 4.7 4.5 4.9 4.7 4.2  CL 107 107 102 106 105  CO2 '23 23 24 25 23  '$ GLUCOSE 123* 134* 109* 129* 134*  BUN 31* 33* 34* 38* 37*  CREATININE 0.52* 0.41* 0.70 0.57* 0.49*  CALCIUM 8.4* 8.3* 9.0 8.7* 9.1  MG  --   --  2.1 1.8 1.8  PHOS  --   --  5.0*  --   --     GFR: Estimated Creatinine Clearance: 69.3 mL/min (A) (by C-G formula based on SCr of 0.49 mg/dL (L)). Liver Function Tests: No results for input(s): "AST", "ALT", "ALKPHOS", "BILITOT", "PROT", "ALBUMIN" in the last 168 hours. No results for input(s): "LIPASE", "AMYLASE" in the last 168 hours. No results for input(s): "AMMONIA" in the last 168 hours. Coagulation Profile: No results for input(s): "INR", "PROTIME" in the last 168 hours. Cardiac Enzymes: No results for input(s): "CKTOTAL", "CKMB", "CKMBINDEX", "TROPONINI" in the last 168 hours. BNP (last 3 results) No results for input(s): "PROBNP" in the last 8760 hours. HbA1C: No results for  input(s): "HGBA1C" in the last 72 hours. CBG: Recent Labs  Lab 06/19/22 2016 06/20/22 0009 06/20/22 0405 06/20/22 0816 06/20/22 1203  GLUCAP 130* 168* 157* 142* 120*    Lipid Profile: No results for input(s): "CHOL", "HDL", "LDLCALC", "TRIG", "CHOLHDL", "LDLDIRECT" in the last 72 hours. Thyroid Function Tests: No results for input(s): "TSH", "T4TOTAL", "FREET4", "T3FREE", "THYROIDAB" in the last 72 hours. Anemia Panel: No results for input(s): "VITAMINB12", "FOLATE", "FERRITIN", "TIBC", "IRON", "RETICCTPCT" in the last 72 hours. Sepsis Labs: No results for input(s): "  PROCALCITON", "LATICACIDVEN" in the last 168 hours.  No results found for this or any previous visit (from the past 240 hour(s)).       Radiology Studies: DG Abd 1 View  Result Date: 06/20/2022 CLINICAL DATA:  Feeding tube placement EXAM: ABDOMEN - 1 VIEW COMPARISON:  06/18/2022 FINDINGS: The feeding tube tip is in the stomach antrum, oriented towards the pylorus. The lung bases appear clear. Dextroconvex lumbar scoliosis with rotary component. Thoracolumbar spondylosis. IMPRESSION: 1. Feeding tube tip is in the stomach antrum, oriented towards the pylorus. Electronically Signed   By: Van Clines M.D.   On: 06/20/2022 15:30        Scheduled Meds:  acetaminophen (TYLENOL) oral liquid 160 mg/5 mL  1,000 mg Oral TID   aspirin  81 mg Per Tube Daily   atorvastatin  20 mg Oral Daily   cholestyramine light  4 g Per Tube BID   clopidogrel  75 mg Per Tube Daily   feeding supplement  237 mL Oral TID BM   feeding supplement (PROSource TF20)  60 mL Per Tube Daily   lidocaine  15 mL Mouth/Throat TID AC & HS   losartan  50 mg Oral Daily   metoprolol tartrate  25 mg Oral BID   mirtazapine  7.5 mg Oral QHS   nystatin  5 mL Oral QID   mouth rinse  15 mL Mouth Rinse 4 times per day   pantoprazole (PROTONIX) IV  40 mg Intravenous Q24H   sucralfate  1 g Oral TID WC & HS   Continuous Infusions:  sodium chloride 10  mL/hr at 06/15/22 2022   feeding supplement (OSMOLITE 1.5 CAL) 1,000 mL (06/21/22 1722)          Aline August, MD [facility] 06/22/2022, 8:16 AM

## 2022-06-22 NOTE — Progress Notes (Signed)
OT Cancellation Note  Patient Details Name: PRENTIS DEPERALTA MRN: [id number] DOB: 1951/02/03   Cancelled Treatment:    Reason Eval/Treat Not Completed: Other (comment). Patient just back to bed. Will follow.  Mc Hollen L Jyllian Haynie 06/22/2022, 3:11 PM

## 2022-06-22 NOTE — Plan of Care (Signed)
  Problem: Fluid Volume: Goal: Hemodynamic stability will improve Outcome: Progressing   Problem: Clinical Measurements: Goal: Diagnostic test results will improve Outcome: Progressing Goal: Signs and symptoms of infection will decrease Outcome: Progressing   Problem: Respiratory: Goal: Ability to maintain adequate ventilation will improve Outcome: Progressing   Problem: Health Behavior/Discharge Planning: Goal: Ability to manage health-related needs will improve Outcome: Progressing   Problem: Clinical Measurements: Goal: Ability to maintain clinical measurements within normal limits will improve Outcome: Progressing Goal: Will remain free from infection Outcome: Progressing Goal: Diagnostic test results will improve Outcome: Progressing Goal: Respiratory complications will improve Outcome: Progressing Goal: Cardiovascular complication will be avoided Outcome: Progressing   Problem: Activity: Goal: Risk for activity intolerance will decrease Outcome: Progressing   Problem: Nutrition: Goal: Adequate nutrition will be maintained Outcome: Progressing   Problem: Coping: Goal: Level of anxiety will decrease Outcome: Progressing   Problem: Elimination: Goal: Will not experience complications related to bowel motility Outcome: Progressing Goal: Will not experience complications related to urinary retention Outcome: Progressing   Problem: Pain Managment: Goal: General experience of comfort will improve Outcome: Progressing   Problem: Safety: Goal: Ability to remain free from injury will improve Outcome: Progressing   Problem: Skin Integrity: Goal: Risk for impaired skin integrity will decrease Outcome: Progressing   Problem: Education: Goal: Knowledge of disease or condition will improve Outcome: Progressing Goal: Knowledge of secondary prevention will improve (MUST DOCUMENT ALL) Outcome: Progressing Goal: Knowledge of patient specific risk factors will  improve [place] N/A or DELETE if not current risk factor) Outcome: Progressing   Problem: Ischemic Stroke/TIA Tissue Perfusion: Goal: Complications of ischemic stroke/TIA will be minimized Outcome: Progressing   Problem: Coping: Goal: Will verbalize positive feelings about self Outcome: Progressing Goal: Will identify appropriate support needs Outcome: Progressing   Problem: Health Behavior/Discharge Planning: Goal: Ability to manage health-related needs will improve Outcome: Progressing Goal: Goals will be collaboratively established with patient/family Outcome: Progressing   Problem: Self-Care: Goal: Ability to participate in self-care as condition permits will improve Outcome: Progressing Goal: Verbalization of feelings and concerns over difficulty with self-care will improve Outcome: Progressing Goal: Ability to communicate needs accurately will improve Outcome: Progressing   Problem: Nutrition: Goal: Risk of aspiration will decrease Outcome: Progressing Goal: Dietary intake will improve Outcome: Progressing

## 2022-06-22 NOTE — Progress Notes (Signed)
Speech Language Pathology Treatment: Dysphagia  Patient Details Name: Jesse [name] MRN: [id number] DOB: 08-16-1950 Today's Date: 06/22/2022 Time: [id number] SLP Time Calculation (min) (ACUTE ONLY): 10 min  Assessment / Plan / Recommendation Clinical Impression  Mr. Vasiliou was seen for dysphagia follow-up. His wife was at bedside and reports some improved ability to eat after EGD with dilation on 3/4.  He does not demonstrate s/s of an oropharyngeal dysphagia when drinking thin liquids. He has persisting odynophagia and was wincing with obvious pain when drinking liquids this afternoon; his wife said the discomfort waxes/wanes.  He continues to get TF and is being followed by Palliative care. There are no further acute care SLP needs identified. There are no concerns for aspiration or transition of POs through the pharynx.  He is managing POs and eating per his preferences.    Our service will sign off. D/W pt and his wife.    HPI HPI: 72 yo male adm to [facility] with weakness and lethargy since saturday as well as poor PO intake. Pt has PMH + for Lung cancer on the left side, reflux esophagitis, GERD, Klebsiella pneumonia, GIST.  He had some increased RR and tachdcardic but now on 6 liters [place].  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  MRI brain 06/11/2022 Scattered small acute infarcts in the supratentorial and infratentorial brain, There are small foci of diffusion restriction in the left frontal cortex , right occipital and bilateral cerebellar hemispheres Moderate degenerative spondylosis noted at C3-4 through C6-7. CXR Parenchymal opacities within the partially visualized right lung, suspicious for pneumonia.   Pt is s/p palliative meeting and per notes he has DNR paper work at home.      SLP Plan  All goals met      Recommendations for follow up therapy are one component of a multi-disciplinary  discharge planning process, led by the attending physician.  Recommendations may be updated based on patient status, additional functional criteria and insurance authorization.    Recommendations  Diet recommendations: Dysphagia 3 (mechanical soft);Thin liquid Liquids provided via: Cup;Straw;Teaspoon Medication Administration: Whole meds with liquid Supervision: Patient able to self feed                Oral Care Recommendations: Oral care BID SLP Visit Diagnosis: Dysphagia, unspecified (R13.10) Plan: All goals met          Rebeca Valdivia L. Tivis Ringer, MA CCC/SLP Clinical Specialist - Acute Care SLP Acute Rehabilitation Services Office number [phone]  Juan Quam Laurice  06/22/2022, 3:10 PM

## 2022-06-22 NOTE — Plan of Care (Signed)
Problem: Fluid Volume: Goal: Hemodynamic stability will improve 06/22/2022 1424 by Iran Ouch, RN Outcome: Progressing 06/22/2022 1424 by Iran Ouch, RN Outcome: Progressing   Problem: Clinical Measurements: Goal: Diagnostic test results will improve 06/22/2022 1424 by Iran Ouch, RN Outcome: Progressing 06/22/2022 1424 by Iran Ouch, RN Outcome: Progressing Goal: Signs and symptoms of infection will decrease 06/22/2022 1424 by Iran Ouch, RN Outcome: Progressing 06/22/2022 1424 by Iran Ouch, RN Outcome: Progressing   Problem: Respiratory: Goal: Ability to maintain adequate ventilation will improve 06/22/2022 1424 by Iran Ouch, RN Outcome: Progressing 06/22/2022 1424 by Iran Ouch, RN Outcome: Progressing   Problem: Health Behavior/Discharge Planning: Goal: Ability to manage health-related needs will improve 06/22/2022 1424 by Iran Ouch, RN Outcome: Progressing 06/22/2022 1424 by Iran Ouch, RN Outcome: Progressing   Problem: Clinical Measurements: Goal: Ability to maintain clinical measurements within normal limits will improve 06/22/2022 1424 by Iran Ouch, RN Outcome: Progressing 06/22/2022 1424 by Iran Ouch, RN Outcome: Progressing Goal: Will remain free from infection 06/22/2022 1424 by Iran Ouch, RN Outcome: Progressing 06/22/2022 1424 by Iran Ouch, RN Outcome: Progressing Goal: Diagnostic test results will improve 06/22/2022 1424 by Iran Ouch, RN Outcome: Progressing 06/22/2022 1424 by Iran Ouch, RN Outcome: Progressing Goal: Respiratory complications will improve 06/22/2022 1424 by Iran Ouch, RN Outcome: Progressing 06/22/2022 1424 by Iran Ouch, RN Outcome: Progressing Goal: Cardiovascular complication will be avoided 06/22/2022 1424 by Iran Ouch, RN Outcome: Progressing 06/22/2022 1424 by Iran Ouch,  RN Outcome: Progressing   Problem: Activity: Goal: Risk for activity intolerance will decrease 06/22/2022 1424 by Iran Ouch, RN Outcome: Progressing 06/22/2022 1424 by Iran Ouch, RN Outcome: Progressing   Problem: Nutrition: Goal: Adequate nutrition will be maintained 06/22/2022 1424 by Iran Ouch, RN Outcome: Progressing 06/22/2022 1424 by Iran Ouch, RN Outcome: Progressing   Problem: Coping: Goal: Level of anxiety will decrease 06/22/2022 1424 by Iran Ouch, RN Outcome: Progressing 06/22/2022 1424 by Iran Ouch, RN Outcome: Progressing   Problem: Elimination: Goal: Will not experience complications related to bowel motility 06/22/2022 1424 by Iran Ouch, RN Outcome: Progressing 06/22/2022 1424 by Iran Ouch, RN Outcome: Progressing Goal: Will not experience complications related to urinary retention 06/22/2022 1424 by Iran Ouch, RN Outcome: Progressing 06/22/2022 1424 by Iran Ouch, RN Outcome: Progressing   Problem: Pain Managment: Goal: General experience of comfort will improve 06/22/2022 1424 by Iran Ouch, RN Outcome: Progressing 06/22/2022 1424 by Iran Ouch, RN Outcome: Progressing   Problem: Safety: Goal: Ability to remain free from injury will improve 06/22/2022 1424 by Iran Ouch, RN Outcome: Progressing 06/22/2022 1424 by Iran Ouch, RN Outcome: Progressing   Problem: Skin Integrity: Goal: Risk for impaired skin integrity will decrease 06/22/2022 1424 by Iran Ouch, RN Outcome: Progressing 06/22/2022 1424 by Iran Ouch, RN Outcome: Progressing   Problem: Education: Goal: Knowledge of disease or condition will improve 06/22/2022 1424 by Iran Ouch, RN Outcome: Progressing 06/22/2022 1424 by Iran Ouch, RN Outcome: Progressing Goal: Knowledge of secondary prevention will improve (MUST DOCUMENT ALL) 06/22/2022 1424 by  Iran Ouch, RN Outcome: Progressing 06/22/2022 1424 by Iran Ouch, RN Outcome: Progressing Goal: Knowledge of patient specific risk factors will improve Elta [place] N/A or DELETE if not current risk factor) 06/22/2022 1424 by Iran Ouch, RN Outcome: Progressing 06/22/2022 1424 by Iran Ouch,  RN Outcome: Progressing   Problem: Ischemic Stroke/TIA Tissue Perfusion: Goal: Complications of ischemic stroke/TIA will be minimized 06/22/2022 1424 by Iran Ouch, RN Outcome: Progressing 06/22/2022 1424 by Iran Ouch, RN Outcome: Progressing   Problem: Coping: Goal: Will verbalize positive feelings about self 06/22/2022 1424 by Iran Ouch, RN Outcome: Progressing 06/22/2022 1424 by Iran Ouch, RN Outcome: Progressing Goal: Will identify appropriate support needs 06/22/2022 1424 by Iran Ouch, RN Outcome: Progressing 06/22/2022 1424 by Iran Ouch, RN Outcome: Progressing   Problem: Health Behavior/Discharge Planning: Goal: Ability to manage health-related needs will improve 06/22/2022 1424 by Iran Ouch, RN Outcome: Progressing 06/22/2022 1424 by Iran Ouch, RN Outcome: Progressing Goal: Goals will be collaboratively established with patient/family 06/22/2022 1424 by Iran Ouch, RN Outcome: Progressing 06/22/2022 1424 by Iran Ouch, RN Outcome: Progressing   Problem: Self-Care: Goal: Ability to participate in self-care as condition permits will improve 06/22/2022 1424 by Iran Ouch, RN Outcome: Progressing 06/22/2022 1424 by Iran Ouch, RN Outcome: Progressing Goal: Verbalization of feelings and concerns over difficulty with self-care will improve 06/22/2022 1424 by Iran Ouch, RN Outcome: Progressing 06/22/2022 1424 by Iran Ouch, RN Outcome: Progressing Goal: Ability to communicate needs accurately will improve 06/22/2022 1424 by Iran Ouch,  RN Outcome: Progressing 06/22/2022 1424 by Iran Ouch, RN Outcome: Progressing   Problem: Nutrition: Goal: Risk of aspiration will decrease 06/22/2022 1424 by Iran Ouch, RN Outcome: Progressing 06/22/2022 1424 by Iran Ouch, RN Outcome: Progressing Goal: Dietary intake will improve 06/22/2022 1424 by Iran Ouch, RN Outcome: Progressing 06/22/2022 1424 by Iran Ouch, RN Outcome: Progressing

## 2022-06-23 DIAGNOSIS — T66XXXA Radiation sickness, unspecified, initial encounter: Secondary | ICD-10-CM | POA: Diagnosis not present

## 2022-06-23 DIAGNOSIS — R6520 Severe sepsis without septic shock: Secondary | ICD-10-CM | POA: Diagnosis not present

## 2022-06-23 DIAGNOSIS — K208 Other esophagitis without bleeding: Secondary | ICD-10-CM | POA: Diagnosis not present

## 2022-06-23 MED ORDER — PANTOPRAZOLE SODIUM 40 MG PO TBEC
40.0000 mg | DELAYED_RELEASE_TABLET | Freq: Every day | ORAL | Status: DC
  Administered 2022-06-23 – 2022-07-06 (×13): 40 mg via ORAL
  Filled 2022-06-23 (×14): qty 1

## 2022-06-23 NOTE — Progress Notes (Signed)
PROGRESS NOTE    Jesse [name]  [id number] DOB: 12-22-1950 DOA: 06/05/2022 PCP: [name], MD   Brief Narrative:  72 year old male with history of GIST of rectum s/p resection/ileostomy with subsequent ileostomy takedown on 03/22/2022, currently on imatinib; history of recurrent adenocarcinoma of left lung currently on concurrent radiation and chemotherapy presented with confusion, hypoxia.  He was found to have sepsis with Klebsiella bacteremia and pneumonia and also a small acute embolic ischemic stroke. Patient seen by cardiology, palliative care, neurology and gastroenterology in consultation. Echo showed slightly reduced EF. Hospitalization complicated with persistently poor oral intake managed with tube feeding, GI following. EGD done 3/4-confirmed radiation esophagitis and a small stricture that was dilated.  Assessment & Plan:   Odynophagia/dysphagia Oral thrush Anorexia Radiation esophagitis Schatzki ring Suspected esophageal candidiasis -Status post EGD on 06/17/2022: Radiation injury in the mid/upper esophagus from 25-33 cm (biopsied). Benign appearing Schatzki ring in the lower esophagus, dilated with 18 mm TTS balloon. No clear esophageal candida on this study, but could be due to him being on therapy for a few days now.  Pathology suggestive of possible radiation injury, no malignancy. -Diflucan had to be discontinued because of interaction with Plavix.  Currently on nystatin. -GI has signed off and recommended Protonix 40 mg daily and Carafate -Oral intake still poor.  Encourage oral intake.  Continue tube feeding diet.  Dietitian following.  Severe sepsis: Present on admission Klebsiella pneumonia bacteremia and pneumonia -Completed 10 days of Rocephin on 06/06/2022.  ID has signed off.  Sepsis has resolved.  Acute metabolic encephalopathy -Multifactorial.  Improved.  Still slow to respond.  Monitor mental status.  Acute CVA, incidentally found on MRI  brain -Seen by neurology thought to be embolic: started on aspirin and Plavix.  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  Seen by cardiology:recommended Zio patch upon discharge   Cardiomyopathy -LVEF 40 to 45%.  No further ischemic workup as per cardiology.  Continue metoprolol and as needed Lasix.  Continue strict input output and daily weights.  Acute on chronic diarrhea -Stool for C. difficile positive but toxin negative.  GI pathogen negative.  Continue Questran (increased dose on 06/22/2022), enteric precaution, symptomatic management.  Still having intermittent diarrhea.  Hypertension  Hyperlipidemia -Blood pressure stable.  Continue losartan, metoprolol tartrate -Continue statin  Leukopenia -Status post filgrastim x 3 for neutropenia.  WBC 2.6 on 06/20/2022.  Monitor intermittently.  Anemia of chronic disease -From malignancy and prognosis.  Hemoglobin stable.  Transfused packed red cells on 06/10/2022.  Recurrent adenocarcinoma of the lung -Diagnosed in 2021. -S/P left upper lobectomy with lymph node dissection on December 16, 2019,repeat video bronchoscopy with EBUS on April 11, 2022 and biopsy from the station 7 lymph node showed recurrent adenocarcinoma. Followed by Dr. Julien Nordmann, currently was undergoing a course of concurrent chemoradiation  -Outpatient follow-up with oncology  GIST tumor of rectum -Treated with imatinib  -s/p  robotic assisted very low anterior rectosigmoid resection with coloanal anastomosis, diverting loop ileostomy and wedge liver biopsy under the care of Dr. Johney Maine on December 14, 2021 and it showed minimal residual gastrointestinal stromal tumor.  Status post ileostomy takedown on 03/22/2022 -Imatinib on hold.  Outpatient follow-up with Dr. Julien Nordmann  Debility/physical  deconditioning -PT OT recommending SNF placement.  TOC following.  Goals of care -Remains full code.  Palliative care following intermittently.  Severe malnutrition -Oral intake still poor.  Encourage oral intake.  Continue tube feeding for now.  Dietitian following.  Oral intake slightly improving but not adequate yet.  DVT prophylaxis: SCDs Code Status: Full Family Communication: Spoke to wife/[name] on phone on 06/20/2022 Disposition Plan: Status is: Inpatient: Because of severity of illness  Consultants: ID/neurology/palliative care/cardiology/gastroenterology  Procedures: As above  Antimicrobials:  Anti-infectives (From admission, onward)    Start     Dose/Rate Route Frequency Ordered Stop   06/15/22 2000  fluconazole (DIFLUCAN) IVPB 200 mg  Status:  Discontinued        200 mg 100 mL/hr over 60 Minutes Intravenous Every 24 hours 06/15/22 1720 06/16/22 1501   06/14/22 1200  cefTRIAXone (ROCEPHIN) 2 g in sodium chloride 0.9 % 100 mL IVPB        2 g 200 mL/hr over 30 Minutes Intravenous Every 24 hours 06/14/22 1116 06/14/22 1725   06/12/22 1230  fluconazole (DIFLUCAN) IVPB 200 mg        200 mg 100 mL/hr over 60 Minutes Intravenous Every 24 hours 06/12/22 1134 06/12/22 1434   06/07/22 1200  vancomycin (VANCOCIN) IVPB 1000 mg/200 mL premix  Status:  Discontinued        1,000 mg 200 mL/hr over 60 Minutes Intravenous Every 36 hours 06/05/22 2323 06/06/22 1108   06/06/22 1200  cefTRIAXone (ROCEPHIN) 2 g in sodium chloride 0.9 % 100 mL IVPB  Status:  Discontinued        2 g 200 mL/hr over 30 Minutes Intravenous Every 24 hours 06/06/22 1110 06/14/22 1116   06/06/22 0200  ceFEPIme (MAXIPIME) 2 g in sodium chloride 0.9 % 100 mL IVPB  Status:  Discontinued        2 g 200 mL/hr over 30 Minutes Intravenous Every 12 hours 06/05/22 2323 06/06/22 1108   06/05/22 2315  vancomycin (VANCOCIN) IVPB 1000 mg/200 mL premix        1,000 mg 200 mL/hr over 60 Minutes Intravenous  Once 06/05/22 2307  06/06/22 0145   06/05/22 1900  cefTRIAXone (ROCEPHIN) 2 g in sodium chloride 0.9 % 100 mL IVPB  Status:  Discontinued        2 g 200 mL/hr over 30 Minutes Intravenous Every 24 hours 06/05/22 1847 06/05/22 2323   06/05/22 1900  azithromycin (ZITHROMAX) 500 mg in sodium chloride 0.9 % 250 mL IVPB  Status:  Discontinued        500 mg 250 mL/hr over 60 Minutes Intravenous Every 24 hours 06/05/22 1847 06/06/22 1108   06/05/22 1830  ceFEPIme (MAXIPIME) 2 g in sodium chloride 0.9 % 100 mL IVPB  Status:  Discontinued        2 g 200 mL/hr over 30 Minutes Intravenous  Once 06/05/22 1820 06/05/22 1847   06/05/22 1830  metroNIDAZOLE (FLAGYL) IVPB 500 mg  Status:  Discontinued        500 mg 100 mL/hr over 60 Minutes Intravenous  Once 06/05/22 1820 06/05/22 1847   06/05/22 1830  vancomycin (VANCOCIN) IVPB 1000 mg/200 mL premix  Status:  Discontinued        1,000 mg 200 mL/hr over 60 Minutes Intravenous  Once 06/05/22 1820 06/05/22 1826   06/05/22 1830  vancomycin (VANCOREADY) IVPB 1500 mg/300 mL  Status:  Discontinued        1,500 mg 150 mL/hr over 120 Minutes Intravenous  Once 06/05/22 1826 06/05/22 1847        Subjective: Patient seen and examined  at bedside.  Oral intake slightly improving but not adequate yet.  No fever, agitation, shortness of breath reported.   Objective: Vitals:   06/22/22 0700 06/22/22 1354 06/22/22 2052 06/23/22 0527  BP:  113/63 117/75 115/65  Pulse:  95 (!) 106 (!) 106  Resp:  '18 18 18  '$ Temp:  (!) 97.4 F (36.3 C) 97.9 F (36.6 C) 97.8 F (36.6 C)  TempSrc:  Oral Oral Oral  SpO2:  99% 99% 99%  Weight: 58.5 kg     Height:        Intake/Output Summary (Last 24 hours) at 06/23/2022 0843 Last data filed at 06/22/2022 1840 Gross per 24 hour  Intake 470 ml  Output 800 ml  Net -330 ml    Filed Weights   06/18/22 0934 06/20/22 0429 06/22/22 0700  Weight: 56.8 kg 58.7 kg 58.5 kg    Examination:  General: Still on room air.  No current distress.  Chronically  ill and deconditioned looking ENT/neck: Has NG tube  respiratory: Decreased breath sounds at bases bilaterally with some crackles  CVS: S1-S2 heard; mild intermittent tachycardia present abdominal: Soft, nontender, still slightly distended; no organomegaly, bowel sounds heard  extremities: Trace lower extremity edema present; no cyanosis CNS: Awake and alert.  Very slow to respond.  Poor historian.  No focal neurologic deficit.  Moving extremities Lymph: No obvious lymphadenopathy palpable Skin: No obvious ecchymosis/lesions  psych: Extremely flat affect.  Showing no signs of agitation  musculoskeletal: No obvious joint swelling/deformity    Data Reviewed: I have personally reviewed following labs and imaging studies  CBC: Recent Labs  Lab 06/18/22 0913 06/18/22 1859 06/20/22 0613  WBC 3.1* 3.1* 2.6*  HGB 8.7* 7.9* 7.9*  HCT 26.9* 23.9* 25.6*  MCV 93.4 91.9 96.2  PLT 236 247 [id number]    Basic Metabolic Panel: Recent Labs  Lab 06/17/22 0701 06/18/22 0543 06/20/22 0613 06/22/22 0528  NA 134* 135 138 138  K 4.5 4.9 4.7 4.2  CL 107 102 106 105  CO2 '23 24 25 23  '$ GLUCOSE 134* 109* 129* 134*  BUN 33* 34* 38* 37*  CREATININE 0.41* 0.70 0.57* 0.49*  CALCIUM 8.3* 9.0 8.7* 9.1  MG  --  2.1 1.8 1.8  PHOS  --  5.0*  --   --     GFR: Estimated Creatinine Clearance: 69.1 mL/min (A) (by C-G formula based on SCr of 0.49 mg/dL (L)). Liver Function Tests: No results for input(s): "AST", "ALT", "ALKPHOS", "BILITOT", "PROT", "ALBUMIN" in the last 168 hours. No results for input(s): "LIPASE", "AMYLASE" in the last 168 hours. No results for input(s): "AMMONIA" in the last 168 hours. Coagulation Profile: No results for input(s): "INR", "PROTIME" in the last 168 hours. Cardiac Enzymes: No results for input(s): "CKTOTAL", "CKMB", "CKMBINDEX", "TROPONINI" in the last 168 hours. BNP (last 3 results) No results for input(s): "PROBNP" in the last 8760 hours. HbA1C: No results for input(s):  "HGBA1C" in the last 72 hours. CBG: Recent Labs  Lab 06/19/22 2016 06/20/22 0009 06/20/22 0405 06/20/22 0816 06/20/22 1203  GLUCAP 130* 168* 157* 142* 120*    Lipid Profile: No results for input(s): "CHOL", "HDL", "LDLCALC", "TRIG", "CHOLHDL", "LDLDIRECT" in the last 72 hours. Thyroid Function Tests: No results for input(s): "TSH", "T4TOTAL", "FREET4", "T3FREE", "THYROIDAB" in the last 72 hours. Anemia Panel: No results for input(s): "VITAMINB12", "FOLATE", "FERRITIN", "TIBC", "IRON", "RETICCTPCT" in the last 72 hours. Sepsis Labs: No results for input(s): "PROCALCITON", "LATICACIDVEN" in the last 168 hours.  No results  found for this or any previous visit (from the past 240 hour(s)).       Radiology Studies: No results found.      Scheduled Meds:  acetaminophen (TYLENOL) oral liquid 160 mg/5 mL  1,000 mg Oral TID   aspirin  81 mg Oral Daily   atorvastatin  20 mg Oral Daily   cholestyramine light  4 g Oral QID   clopidogrel  75 mg Oral Daily   feeding supplement  237 mL Oral TID BM   feeding supplement (PROSource TF20)  60 mL Per Tube Daily   lidocaine  15 mL Mouth/Throat TID AC & HS   losartan  50 mg Oral Daily   metoprolol tartrate  25 mg Oral BID   mirtazapine  7.5 mg Oral QHS   nystatin  5 mL Oral QID   mouth rinse  15 mL Mouth Rinse 4 times per day   pantoprazole (PROTONIX) IV  40 mg Intravenous Q24H   sucralfate  1 g Oral TID WC & HS   Continuous Infusions:  sodium chloride 10 mL/hr at 06/15/22 2022   feeding supplement (OSMOLITE 1.5 CAL) 1,000 mL (06/21/22 1722)          Aline August, MD [facility] 06/23/2022, 8:43 AM

## 2022-06-23 NOTE — Progress Notes (Signed)
Mobility Specialist - Progress Note   06/23/22 1241  Mobility  Activity Stood at bedside;Transferred from bed to chair  Level of Assistance Contact guard assist, steadying assist  Assistive Device Front wheel walker  Distance Ambulated (ft) 5 ft  Activity Response Tolerated well  Mobility Referral Yes  $Mobility charge 1 Mobility   Pt received in bed and agreeable to mobility. Upon standing pt was soiled. Assisted NT w/ cleaning pt up. Pt c/o feeling weak throughout getting clean which required pt to sit several times (3x). After getting cleaned up, assisted pt to recliner. No complaints during transfer. Pt to recliner after session w/ all needs met, chair alarm on & call bell in reach.     [facility]

## 2022-06-24 DIAGNOSIS — T66XXXA Radiation sickness, unspecified, initial encounter: Secondary | ICD-10-CM | POA: Diagnosis not present

## 2022-06-24 DIAGNOSIS — R6520 Severe sepsis without septic shock: Secondary | ICD-10-CM | POA: Diagnosis not present

## 2022-06-24 DIAGNOSIS — K208 Other esophagitis without bleeding: Secondary | ICD-10-CM | POA: Diagnosis not present

## 2022-06-24 NOTE — Progress Notes (Signed)
PROGRESS NOTE    RACE HIDER  [id number] DOB: 1950/12/19 DOA: 06/05/2022 PCP: [name], MD   Brief Narrative:  72 year old male with history of GIST of rectum s/p resection/ileostomy with subsequent ileostomy takedown on 03/22/2022, currently on imatinib; history of recurrent adenocarcinoma of left lung currently on concurrent radiation and chemotherapy presented with confusion, hypoxia.  He was found to have sepsis with Klebsiella bacteremia and pneumonia and also a small acute embolic ischemic stroke. Patient seen by cardiology, palliative care, neurology and gastroenterology in consultation. Echo showed slightly reduced EF. Hospitalization complicated with persistently poor oral intake managed with tube feeding, GI following. EGD done 3/4-confirmed radiation esophagitis and a small stricture that was dilated.  Assessment & Plan:   Odynophagia/dysphagia Oral thrush Anorexia Radiation esophagitis Schatzki ring Suspected esophageal candidiasis -Status post EGD on 06/17/2022: Radiation injury in the mid/upper esophagus from 25-33 cm (biopsied). Benign appearing Schatzki ring in the lower esophagus, dilated with 18 mm TTS balloon. No clear esophageal candida on this study, but could be due to him being on therapy for a few days now.  Pathology suggestive of possible radiation injury, no malignancy. -Diflucan had to be discontinued because of interaction with Plavix.  Currently on nystatin. -GI has signed off and recommended Protonix 40 mg daily and Carafate -Oral intake still poor.  Encourage oral intake.  Continue tube feeding diet.  Dietitian following.  Severe sepsis: Present on admission Klebsiella pneumonia bacteremia and pneumonia -Completed 10 days of Rocephin on 06/06/2022.  ID has signed off.  Sepsis has resolved.  Acute metabolic encephalopathy -Multifactorial.  Improved.  Still slow to respond.  Monitor mental status.  Acute CVA, incidentally found on MRI  brain -Seen by neurology thought to be embolic: started on aspirin and Plavix.  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  Seen by cardiology:recommended Zio patch upon discharge   Cardiomyopathy -LVEF 40 to 45%.  No further ischemic workup as per cardiology.  Continue metoprolol and as needed Lasix.  Continue strict input output and daily weights.  Acute on chronic diarrhea -Stool for C. difficile positive but toxin negative.  GI pathogen negative.  Continue Questran (increased dose on 06/22/2022), enteric precaution, symptomatic management.  Still having intermittent diarrhea.  Hypertension  Hyperlipidemia -Blood pressure stable.  Continue losartan, metoprolol tartrate -Continue statin  Leukopenia -Status post filgrastim x 3 for neutropenia.  WBC 2.6 on 06/20/2022.  Monitor intermittently.  Anemia of chronic disease -From malignancy and prognosis.  Hemoglobin stable.  Transfused packed red cells on 06/10/2022.  Recurrent adenocarcinoma of the lung -Diagnosed in 2021. -S/P left upper lobectomy with lymph node dissection on December 16, 2019,repeat video bronchoscopy with EBUS on April 11, 2022 and biopsy from the station 7 lymph node showed recurrent adenocarcinoma. Followed by Dr. Julien Nordmann, currently was undergoing a course of concurrent chemoradiation  -Outpatient follow-up with oncology  GIST tumor of rectum -Treated with imatinib  -s/p  robotic assisted very low anterior rectosigmoid resection with coloanal anastomosis, diverting loop ileostomy and wedge liver biopsy under the care of Dr. Johney Maine on December 14, 2021 and it showed minimal residual gastrointestinal stromal tumor.  Status post ileostomy takedown on 03/22/2022 -Imatinib on hold.  Outpatient follow-up with Dr. Julien Nordmann  Debility/physical  deconditioning -PT OT recommending SNF placement.  TOC following.  Goals of care -Remains full code.  Palliative care following intermittently.  Severe malnutrition -Oral intake still poor.  Encourage oral intake.  Continue tube feeding for now.  Dietitian following.  Oral intake slightly improving but not adequate yet.  DVT prophylaxis: SCDs Code Status: Full Family Communication: Spoke to wife/[name] on phone on 06/20/2022 Disposition Plan: Status is: Inpatient: Because of severity of illness  Consultants: ID/neurology/palliative care/cardiology/gastroenterology  Procedures: As above  Antimicrobials:  Anti-infectives (From admission, onward)    Start     Dose/Rate Route Frequency Ordered Stop   06/15/22 2000  fluconazole (DIFLUCAN) IVPB 200 mg  Status:  Discontinued        200 mg 100 mL/hr over 60 Minutes Intravenous Every 24 hours 06/15/22 1720 06/16/22 1501   06/14/22 1200  cefTRIAXone (ROCEPHIN) 2 g in sodium chloride 0.9 % 100 mL IVPB        2 g 200 mL/hr over 30 Minutes Intravenous Every 24 hours 06/14/22 1116 06/14/22 1725   06/12/22 1230  fluconazole (DIFLUCAN) IVPB 200 mg        200 mg 100 mL/hr over 60 Minutes Intravenous Every 24 hours 06/12/22 1134 06/12/22 1434   06/07/22 1200  vancomycin (VANCOCIN) IVPB 1000 mg/200 mL premix  Status:  Discontinued        1,000 mg 200 mL/hr over 60 Minutes Intravenous Every 36 hours 06/05/22 2323 06/06/22 1108   06/06/22 1200  cefTRIAXone (ROCEPHIN) 2 g in sodium chloride 0.9 % 100 mL IVPB  Status:  Discontinued        2 g 200 mL/hr over 30 Minutes Intravenous Every 24 hours 06/06/22 1110 06/14/22 1116   06/06/22 0200  ceFEPIme (MAXIPIME) 2 g in sodium chloride 0.9 % 100 mL IVPB  Status:  Discontinued        2 g 200 mL/hr over 30 Minutes Intravenous Every 12 hours 06/05/22 2323 06/06/22 1108   06/05/22 2315  vancomycin (VANCOCIN) IVPB 1000 mg/200 mL premix        1,000 mg 200 mL/hr over 60 Minutes Intravenous  Once 06/05/22 2307  06/06/22 0145   06/05/22 1900  cefTRIAXone (ROCEPHIN) 2 g in sodium chloride 0.9 % 100 mL IVPB  Status:  Discontinued        2 g 200 mL/hr over 30 Minutes Intravenous Every 24 hours 06/05/22 1847 06/05/22 2323   06/05/22 1900  azithromycin (ZITHROMAX) 500 mg in sodium chloride 0.9 % 250 mL IVPB  Status:  Discontinued        500 mg 250 mL/hr over 60 Minutes Intravenous Every 24 hours 06/05/22 1847 06/06/22 1108   06/05/22 1830  ceFEPIme (MAXIPIME) 2 g in sodium chloride 0.9 % 100 mL IVPB  Status:  Discontinued        2 g 200 mL/hr over 30 Minutes Intravenous  Once 06/05/22 1820 06/05/22 1847   06/05/22 1830  metroNIDAZOLE (FLAGYL) IVPB 500 mg  Status:  Discontinued        500 mg 100 mL/hr over 60 Minutes Intravenous  Once 06/05/22 1820 06/05/22 1847   06/05/22 1830  vancomycin (VANCOCIN) IVPB 1000 mg/200 mL premix  Status:  Discontinued        1,000 mg 200 mL/hr over 60 Minutes Intravenous  Once 06/05/22 1820 06/05/22 1826   06/05/22 1830  vancomycin (VANCOREADY) IVPB 1500 mg/300 mL  Status:  Discontinued        1,500 mg 150 mL/hr over 120 Minutes Intravenous  Once 06/05/22 1826 06/05/22 1847        Subjective: Patient seen and examined  at bedside.  No seizures, fever, vomiting or agitation reported.  States that he is eating better.   Objective: Vitals:   06/23/22 1613 06/23/22 2216 06/24/22 0040 06/24/22 0500  BP: 106/64 102/68 104/63   Pulse: 100 (!) 108 (!) 109   Resp: 18 20    Temp: 97.6 F (36.4 C) (!) 97.4 F (36.3 C)    TempSrc: Oral Oral    SpO2: 100% 100%    Weight:    59.1 kg  Height:        Intake/Output Summary (Last 24 hours) at 06/24/2022 0818 Last data filed at 06/24/2022 0011 Gross per 24 hour  Intake 980 ml  Output 350 ml  Net 630 ml    Filed Weights   06/20/22 0429 06/22/22 0700 06/24/22 0500  Weight: 58.7 kg 58.5 kg 59.1 kg    Examination:  General: No acute distress.  On room air.  Chronically ill and deconditioned looking ENT/neck: Still has  NG tube.   Respiratory: Bilateral decreased breath sounds at bases with scattered crackles  CVS: Still tachycardic; S1 and S2 are heard  abdominal: Soft, nontender, mildly distended; no organomegaly, normal bowel sounds are heard extremities: No clubbing; mild lower extremity edema present  CNS: Alert and oriented.  Still very slow to respond.  Poor historian.  No focal neurologic deficit.  Able to move extremities  lymph: No palpable lymphadenopathy noted  skin: No obvious rashes/petechiae psych: Not agitated currently.  Still has a very flat affect.   Musculoskeletal: No obvious joint swelling/deformity    Data Reviewed: I have personally reviewed following labs and imaging studies  CBC: Recent Labs  Lab 06/18/22 0913 06/18/22 1859 06/20/22 0613  WBC 3.1* 3.1* 2.6*  HGB 8.7* 7.9* 7.9*  HCT 26.9* 23.9* 25.6*  MCV 93.4 91.9 96.2  PLT 236 247 [id number]    Basic Metabolic Panel: Recent Labs  Lab 06/18/22 0543 06/20/22 0613 06/22/22 0528  NA 135 138 138  K 4.9 4.7 4.2  CL 102 106 105  CO2 '24 25 23  '$ GLUCOSE 109* 129* 134*  BUN 34* 38* 37*  CREATININE 0.70 0.57* 0.49*  CALCIUM 9.0 8.7* 9.1  MG 2.1 1.8 1.8  PHOS 5.0*  --   --     GFR: Estimated Creatinine Clearance: 69.8 mL/min (A) (by C-G formula based on SCr of 0.49 mg/dL (L)). Liver Function Tests: No results for input(s): "AST", "ALT", "ALKPHOS", "BILITOT", "PROT", "ALBUMIN" in the last 168 hours. No results for input(s): "LIPASE", "AMYLASE" in the last 168 hours. No results for input(s): "AMMONIA" in the last 168 hours. Coagulation Profile: No results for input(s): "INR", "PROTIME" in the last 168 hours. Cardiac Enzymes: No results for input(s): "CKTOTAL", "CKMB", "CKMBINDEX", "TROPONINI" in the last 168 hours. BNP (last 3 results) No results for input(s): "PROBNP" in the last 8760 hours. HbA1C: No results for input(s): "HGBA1C" in the last 72 hours. CBG: Recent Labs  Lab 06/19/22 2016 06/20/22 0009  06/20/22 0405 06/20/22 0816 06/20/22 1203  GLUCAP 130* 168* 157* 142* 120*    Lipid Profile: No results for input(s): "CHOL", "HDL", "LDLCALC", "TRIG", "CHOLHDL", "LDLDIRECT" in the last 72 hours. Thyroid Function Tests: No results for input(s): "TSH", "T4TOTAL", "FREET4", "T3FREE", "THYROIDAB" in the last 72 hours. Anemia Panel: No results for input(s): "VITAMINB12", "FOLATE", "FERRITIN", "TIBC", "IRON", "RETICCTPCT" in the last 72 hours. Sepsis Labs: No results for input(s): "PROCALCITON", "LATICACIDVEN" in the last 168 hours.  No results found for this or any previous visit (from the past 240  hour(s)).       Radiology Studies: No results found.      Scheduled Meds:  acetaminophen (TYLENOL) oral liquid 160 mg/5 mL  1,000 mg Oral TID   aspirin  81 mg Oral Daily   atorvastatin  20 mg Oral Daily   cholestyramine light  4 g Oral QID   clopidogrel  75 mg Oral Daily   feeding supplement  237 mL Oral TID BM   feeding supplement (PROSource TF20)  60 mL Per Tube Daily   lidocaine  15 mL Mouth/Throat TID AC & HS   losartan  50 mg Oral Daily   metoprolol tartrate  25 mg Oral BID   mirtazapine  7.5 mg Oral QHS   nystatin  5 mL Oral QID   mouth rinse  15 mL Mouth Rinse 4 times per day   pantoprazole  40 mg Oral Daily   sucralfate  1 g Oral TID WC & HS   Continuous Infusions:  sodium chloride 10 mL/hr at 06/15/22 2022   feeding supplement (OSMOLITE 1.5 CAL) Stopped (06/24/22 [id number])          Aline August, MD [facility] 06/24/2022, 8:18 AM

## 2022-06-25 ENCOUNTER — Inpatient Hospital Stay: Payer: Medicare Other

## 2022-06-25 ENCOUNTER — Other Ambulatory Visit: Payer: Self-pay

## 2022-06-25 ENCOUNTER — Inpatient Hospital Stay: Payer: Medicare Other | Admitting: Physician Assistant

## 2022-06-25 ENCOUNTER — Ambulatory Visit
Admission: RE | Admit: 2022-06-25 | Discharge: 2022-06-25 | Disposition: A | Discharge status: Home / Self Care | Payer: Medicare Other | Source: Ambulatory Visit | Attending: Radiation Oncology | Admitting: Radiation Oncology

## 2022-06-25 ENCOUNTER — Ambulatory Visit: Payer: Medicare Other

## 2022-06-25 DIAGNOSIS — F17210 Nicotine dependence, cigarettes, uncomplicated: Secondary | ICD-10-CM | POA: Diagnosis not present

## 2022-06-25 DIAGNOSIS — Z510 Encounter for antineoplastic radiation therapy: Secondary | ICD-10-CM | POA: Diagnosis not present

## 2022-06-25 DIAGNOSIS — R6520 Severe sepsis without septic shock: Secondary | ICD-10-CM | POA: Diagnosis not present

## 2022-06-25 DIAGNOSIS — C771 Secondary and unspecified malignant neoplasm of intrathoracic lymph nodes: Secondary | ICD-10-CM | POA: Diagnosis not present

## 2022-06-25 DIAGNOSIS — C3492 Malignant neoplasm of unspecified part of left bronchus or lung: Secondary | ICD-10-CM | POA: Diagnosis not present

## 2022-06-25 DIAGNOSIS — Z515 Encounter for palliative care: Secondary | ICD-10-CM | POA: Diagnosis not present

## 2022-06-25 DIAGNOSIS — T66XXXA Radiation sickness, unspecified, initial encounter: Secondary | ICD-10-CM | POA: Diagnosis not present

## 2022-06-25 DIAGNOSIS — K208 Other esophagitis without bleeding: Secondary | ICD-10-CM | POA: Diagnosis not present

## 2022-06-25 DIAGNOSIS — C3412 Malignant neoplasm of upper lobe, left bronchus or lung: Secondary | ICD-10-CM | POA: Diagnosis not present

## 2022-06-25 DIAGNOSIS — A419 Sepsis, unspecified organism: Secondary | ICD-10-CM | POA: Diagnosis not present

## 2022-06-25 LAB — RAD ONC ARIA SESSION SUMMARY
Course Elapsed Days: 41
Plan Fractions Treated to Date: 27
Plan Prescribed Dose Per Fraction: 2 Gy
Plan Total Fractions Prescribed: 30
Plan Total Prescribed Dose: 60 Gy
Reference Point Dosage Given to Date: 54 Gy
Reference Point Session Dosage Given: 2 Gy
Session Number: 27

## 2022-06-25 LAB — CBC WITH DIFFERENTIAL/PLATELET
Abs Immature Granulocytes: 0.19 10*3/uL — ABNORMAL HIGH (ref 0.00–0.07)
Basophils Absolute: 0 10*3/uL (ref 0.0–0.1)
Basophils Relative: 1 %
Eosinophils Absolute: 0.1 10*3/uL (ref 0.0–0.5)
Eosinophils Relative: 3 %
HCT: 24.2 % — ABNORMAL LOW (ref 39.0–52.0)
Hemoglobin: 7.6 g/dL — ABNORMAL LOW (ref 13.0–17.0)
Immature Granulocytes: 5 %
Lymphocytes Relative: 8 %
Lymphs Abs: 0.3 10*3/uL — ABNORMAL LOW (ref 0.7–4.0)
MCH: 29.5 pg (ref 26.0–34.0)
MCHC: 31.4 g/dL (ref 30.0–36.0)
MCV: 93.8 fL (ref 80.0–100.0)
Monocytes Absolute: 0.4 10*3/uL (ref 0.1–1.0)
Monocytes Relative: 11 %
Neutro Abs: 3.1 10*3/uL (ref 1.7–7.7)
Neutrophils Relative %: 72 %
Platelets: 389 10*3/uL (ref 150–400)
RBC: 2.58 MIL/uL — ABNORMAL LOW (ref 4.22–5.81)
RDW: 14.7 % (ref 11.5–15.5)
WBC: 4.2 10*3/uL (ref 4.0–10.5)
nRBC: 0 % (ref 0.0–0.2)

## 2022-06-25 LAB — BASIC METABOLIC PANEL
Anion gap: 7 (ref 5–15)
BUN: 32 mg/dL — ABNORMAL HIGH (ref 8–23)
CO2: 24 mmol/L (ref 22–32)
Calcium: 8.6 mg/dL — ABNORMAL LOW (ref 8.9–10.3)
Chloride: 103 mmol/L (ref 98–111)
Creatinine, Ser: 0.59 mg/dL — ABNORMAL LOW (ref 0.61–1.24)
GFR, Estimated: >60 mL/min (ref >60)
Glucose, Bld: 143 mg/dL — ABNORMAL HIGH (ref 70–99)
Potassium: 4.1 mmol/L (ref 3.5–5.1)
Sodium: 134 mmol/L — ABNORMAL LOW (ref 135–145)

## 2022-06-25 LAB — MAGNESIUM: Magnesium: 1.8 mg/dL (ref 1.7–2.4)

## 2022-06-25 LAB — PHOSPHORUS: Phosphorus: 4.3 mg/dL (ref 2.5–4.6)

## 2022-06-25 NOTE — Progress Notes (Signed)
Daily Progress Note   Patient Name: Jesse [name]       Date: 06/25/2022 DOB: [birth year]  Age: 72 y.o. MRN#: [id number] Attending Physician: Aline August, MD Primary Care Physician: [name], MD Admit Date: 06/05/2022  Reason for Consultation/Follow-up:   "lung cancer patient presented with with bacteremia.has very poor intake ,on tube feed,very deconditioned,weak.Quality of life is very poor. Needs goals of care"   Patient Profile/HPI: 72 y.o. male  with past medical history of GIST tumor of pelvic area (dx 12/2019) - s/p resection and loop ileostomy for fecal diversion 12/14/21 and takedown on 03/22/22 on Gleevec; [place] lung cancer dx June 2021 with positive mediastinal lymph node December 2023 currently on concurrent chemoradiation tx with carbo/paclitaxel admitted on 06/05/2022 with sepsis d/t bacteremia- psuedomonas klebsiella after being very weak and unresponsive at home. Further workup revealed multiple scattered embolic strokes- workup in progress to determine cause. He has had poor po intake and significant weight loss- cortrak tube feeding is in place.       Subjective: Chart reviewed including labs, progress notes, imaging from this and previous encounters.  Spouse is at bedside. [name] feels he is eating more, [name] agrees that he is trying to eat more. The lidocaine helps with pain, but food just doesn't taste good. He doesn't like cold foods or bland foods.    Physical Exam Vitals and nursing note reviewed.  Constitutional:      Comments: frail  Pulmonary:     Effort: Pulmonary effort is normal.  Neurological:     Mental Status: He is alert.             Vital Signs: BP 120/72 (BP Location: Right Arm)   Pulse (!) 110   Temp 98.3 F (36.8 C) (Oral)   Resp 20   Ht '5\' 9"'$   (1.753 m)   Wt 59.1 kg   SpO2 99%   BMI 19.24 kg/m  SpO2: SpO2: 99 % O2 Device: O2 Device: Room Air O2 Flow Rate: O2 Flow Rate (L/min): 3 L/min  Intake/output summary:  Intake/Output Summary (Last 24 hours) at 06/25/2022 1311 Last data filed at 06/25/2022 1001 Gross per 24 hour  Intake 240 ml  Output 1250 ml  Net -1010 ml    LBM: Last BM Date : 06/23/22 Baseline Weight: Weight: 65.7 kg Most  recent weight: Weight: 59.1 kg       Palliative Assessment/Data: PPS: 30%      Patient Active Problem List   Diagnosis Date Noted   Esophageal dysphagia 06/18/2022   Esophageal stricture 06/18/2022   Hiatal hernia 06/18/2022   Radiation esophagitis 06/18/2022   Odynophagia 06/18/2022   Drug-induced neutropenia ([place]) 06/07/2022   Protein-calorie malnutrition, severe 06/07/2022   Bacteremia due to Klebsiella pneumoniae 06/06/2022   Pneumonia of right middle lobe due to Klebsiella pneumoniae ([place]) 06/06/2022   Severe sepsis ([place]) 06/05/2022   Hypokalemia 06/05/2022   AKI (acute kidney injury) ([place]) 06/05/2022   Hyponatremia 0000000   Acute metabolic encephalopathy 0000000   Acute respiratory failure with hypoxia ([place]) 06/05/2022   CAP (community acquired pneumonia) 06/05/2022   Thrombocytopenia ([place]) 06/05/2022   Sepsis ([place]) 06/05/2022   Diarrhea 06/05/2022   Weight loss 05/28/2022   Malignant neoplasm of lung ([place]) 04/30/2022   Adenopathy 04/06/2022   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[place]) 08/21/2021   Encounter for antineoplastic chemotherapy 01/28/2020   Adenocarcinoma of left lung, stage 2 ([place]) 12/31/2019   Goals of care, counseling/discussion 12/31/2019   S/P lobectomy of lung 12/16/2019   Lung nodule 10/13/2019   Essential hypertension 09/28/2016   Coronary artery calcification 09/28/2016   Impaired glucose tolerance 09/28/2016   NEOPLASM, MALIGNANT, BLADDER, HX OF 07/18/2007   Vitamin D deficiency 06/03/2007   DIVERTICULOSIS, COLON  01/14/2007   Osteoarthritis 01/14/2007   Dyslipidemia 01/09/2007   GERD 01/09/2007   REFLUX ESOPHAGITIS 06/01/2004   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    Palliative Care Assessment & Plan    Assessment/Recommendations/Plan  Continue current plan of care Dietician consulted for education re: chemo/radiation taste changes and food choices  Code Status: Full code  Prognosis:  Unable to determine  Discharge Planning: To Be Determined  Care plan was discussed with patient's spouse and care team.   Thank you for allowing the Palliative Medicine Team to assist in the care of this patient.   Greater than 50%  of this time was spent counseling and coordinating care related to the above assessment and plan.  Mariana Kaufman, AGNP-C Palliative Medicine   Please contact Palliative Medicine Team phone at [phone] for questions and concerns.

## 2022-06-25 NOTE — Progress Notes (Signed)
Physical Therapy Treatment Patient Details Name: Jesse [name] MRN: [id number] DOB: 1950/12/15 Today's Date: 06/25/2022   History of Present Illness Patient is a 72 year old male with significant hx of lung and bladder CA currently on chemo and radiation treatments, who presented with severe fatigue and confusion, cyanosis and soft BPs. Admitted with sepsis from unspecified organism and PNA R lung. PMH CA, COPD, HLD, lung mass, bladder CA, macular degeneration, hx ileostomy, B knee surgery    PT Comments    Pt supine in bed, agreeable to therapy and c/o constant back pain during therapy. Pt using bedrail to assist in uprighting trunk into sitting. Pt noted to be soiled upon rising, using single UE for pericare with min A for static standing at RW. Pt fatigues with static standing, needing seated rest break then able to complete on 2nd rise to stand. Pt then transfers to recliner, min guard for safety. Pt tolerates seated exercises, cues for motor control and muscle activation. Pt amb in room with RW, cues for upright posture and increased eye gaze, generally unsteady without overt LOB, very narrow BOS. Pt with constant back pain, requests to return to sitting, min guard with increased time to lift BLE back into bed. Pt supine in bed with spouse at bedside at [place]; RN bringing pain medication.    Recommendations for follow up therapy are one component of a multi-disciplinary discharge planning process, led by the attending physician.  Recommendations may be updated based on patient status, additional functional criteria and insurance authorization.  Follow Up Recommendations  Skilled nursing-short term rehab (<3 hours/day) Can patient physically be transported by private vehicle: No   Assistance Recommended at Discharge Frequent or constant Supervision/Assistance  Patient can return home with the following A little help with walking and/or transfers;A little help with  bathing/dressing/bathroom;Assistance with cooking/housework;Assist for transportation;Help with stairs or ramp for entrance   Equipment Recommendations  None recommended by PT    Recommendations for Other Services       Precautions / Restrictions Precautions Precautions: Fall Precaution Comments: watch /HR,  TF Restrictions Weight Bearing Restrictions: No     Mobility  Bed Mobility Overal bed mobility: Needs Assistance Bed Mobility: Supine to Sit, Sit to Supine  Sidelying to sit: Min guard Supine to sit: Min guard  General bed mobility comments: pt pulls on bedrails to upright trunk into sitting, increased time to lift BLE back into bed without assist    Transfers Overall transfer level: Needs assistance Equipment used: Rolling walker (2 wheels) Transfers: Sit to/from Stand, Bed to chair/wheelchair/BSC Sit to Stand: Min assist  Step pivot transfers: Min guard  General transfer comment: min A to steady and cue pt in anterior weight shift and upright posture, cues for hand placement for all reps; min guard with step pivot to recliner    Ambulation/Gait Ambulation/Gait assistance: Min guard Gait Distance (Feet): 12 Feet Assistive device: Rolling walker (2 wheels) Gait Pattern/deviations: Step-through pattern, Narrow base of support Gait velocity: decreased  General Gait Details: unsteady step-through gait pattern without overt LOB, narrow BOS, cues for upright posture and eye gaze up   Stairs             Wheelchair Mobility    Modified Rankin (Stroke Patients Only)       Balance Overall balance assessment: Needs assistance Sitting-balance support: No upper extremity supported, Feet supported Sitting balance-[name]: Fair  Standing balance support: Reliant on assistive device for balance, During functional activity, Bilateral upper extremity supported  Standing balance-[name]: Poor Standing balance comment: RW with min G-min A     Cognition  Arousal/Alertness: Awake/alert Behavior During Therapy: WFL for tasks assessed/performed Overall Cognitive Status: Within Functional Limits for tasks assessed  General Comments: pt follows commands        Exercises General Exercises - Lower Extremity Long Arc Quad: Seated, AROM, Strengthening, Both, 10 reps Hip Flexion/Marching: Seated, AROM, Strengthening, Both, 10 reps    General Comments General comments (skin integrity, edema, etc.): HR max 120 noted with standing and amb, improves to 90s with return to supine      Pertinent Vitals/Pain Pain Assessment Pain Assessment: 0-10 Pain Score: 6  Pain Location: back Pain Descriptors / Indicators: Guarding, Discomfort Pain Intervention(s): Limited activity within patient's tolerance, Monitored during session, Repositioned, Patient requesting pain meds-RN notified    Home Living                          Prior Function            PT Goals (current goals can now be found in the care plan section) Acute Rehab PT Goals Patient Stated Goal: be able to work in garden PT Goal Formulation: With patient Time For Goal Achievement: 07/05/22 Potential to Achieve Goals: Good Progress towards PT goals: Progressing toward goals    Frequency    Min 2X/week      PT Plan Current plan remains appropriate    Co-evaluation              AM-PAC PT "6 Clicks" Mobility   Outcome Measure  Help needed turning from your back to your side while in a flat bed without using bedrails?: A Little Help needed moving from lying on your back to sitting on the side of a flat bed without using bedrails?: A Little Help needed moving to and from a bed to a chair (including a wheelchair)?: A Little Help needed standing up from a chair using your arms (e.g., wheelchair or bedside chair)?: A Little Help needed to walk in hospital room?: A Little Help needed climbing 3-5 steps with a railing? : Total 6 Click Score: 16    End of Session  Equipment Utilized During Treatment: Gait belt Activity Tolerance: Patient tolerated treatment well Patient left: in bed;with call bell/phone within reach;with bed alarm set;with family/visitor present Nurse Communication: Mobility status PT Visit Diagnosis: Unsteadiness on feet (R26.81);Difficulty in walking, not elsewhere classified (R26.2);Muscle weakness (generalized) (M62.81)     Time: [id number] PT Time Calculation (min) (ACUTE ONLY): 35 min  Charges:  $Therapeutic Exercise: 8-22 mins $Therapeutic Activity: 8-22 mins                      Tori Jaila Schellhorn PT, DPT 06/25/22, 2:52 PM

## 2022-06-25 NOTE — Progress Notes (Signed)
PROGRESS NOTE    Jesse [name]  [id number] DOB: 01/29/1951 DOA: 06/05/2022 PCP: [name], MD   Brief Narrative:  72 year old male with history of GIST of rectum s/p resection/ileostomy with subsequent ileostomy takedown on 03/22/2022, currently on imatinib; history of recurrent adenocarcinoma of left lung currently on concurrent radiation and chemotherapy presented with confusion, hypoxia.  He was found to have sepsis with Klebsiella bacteremia and pneumonia and also a small acute embolic ischemic stroke. Patient seen by cardiology, palliative care, neurology and gastroenterology in consultation. Echo showed slightly reduced EF. Hospitalization complicated with persistently poor oral intake managed with tube feeding, GI following. EGD done 3/4-confirmed radiation esophagitis and a small stricture that was dilated.  Assessment & Plan:   Odynophagia/dysphagia Oral thrush Anorexia Radiation esophagitis Schatzki ring Suspected esophageal candidiasis -Status post EGD on 06/17/2022: Radiation injury in the mid/upper esophagus from 25-33 cm (biopsied). Benign appearing Schatzki ring in the lower esophagus, dilated with 18 mm TTS balloon. No clear esophageal candida on this study, but could be due to him being on therapy for a few days now.  Pathology suggestive of possible radiation injury, no malignancy. -Diflucan had to be discontinued because of interaction with Plavix.  Currently on nystatin. -GI has signed off and recommended Protonix 40 mg daily and Carafate -Oral intake still poor.  Encourage oral intake.  Continue tube feeding diet.  Dietitian following.  Severe sepsis: Present on admission Klebsiella pneumonia bacteremia and pneumonia -Completed 10 days of Rocephin on 06/06/2022.  ID has signed off.  Sepsis has resolved.  Acute metabolic encephalopathy -Multifactorial.  Improved.  Still slow to respond.  Monitor mental status.  Acute CVA, incidentally found on MRI  brain -Seen by neurology thought to be embolic: started on aspirin and Plavix.  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  Seen by cardiology:recommended Zio patch upon discharge   Cardiomyopathy -LVEF 40 to 45%.  No further ischemic workup as per cardiology.  Continue metoprolol and as needed Lasix.  Continue strict input output and daily weights.  Acute on chronic diarrhea -Stool for C. difficile positive but toxin negative.  GI pathogen negative.  Continue Questran (increased dose on 06/22/2022), enteric precaution, symptomatic management.  Still having intermittent diarrhea.  Hypertension  Hyperlipidemia -Blood pressure stable.  Continue losartan, metoprolol tartrate -Continue statin  Leukopenia -Status post filgrastim x 3 for neutropenia.  WBC 2.6 on 06/20/2022.  Monitor intermittently.  Anemia of chronic disease -From malignancy and prognosis.  Hemoglobin stable.  Transfused packed red cells on 06/10/2022.  Recurrent adenocarcinoma of the lung -Diagnosed in 2021. -S/P left upper lobectomy with lymph node dissection on December 16, 2019,repeat video bronchoscopy with EBUS on April 11, 2022 and biopsy from the station 7 lymph node showed recurrent adenocarcinoma. Followed by Dr. Julien Nordmann, currently was undergoing a course of concurrent chemoradiation  -Outpatient follow-up with oncology  GIST tumor of rectum -Treated with imatinib  -s/p  robotic assisted very low anterior rectosigmoid resection with coloanal anastomosis, diverting loop ileostomy and wedge liver biopsy under the care of Dr. Johney Maine on December 14, 2021 and it showed minimal residual gastrointestinal stromal tumor.  Status post ileostomy takedown on 03/22/2022 -Imatinib on hold.  Outpatient follow-up with Dr. Julien Nordmann  Debility/physical  deconditioning -PT OT recommending SNF placement.  TOC following.  Goals of care -Remains full code.  Palliative care following intermittently.  Severe malnutrition -Oral intake still poor.  Encourage oral intake.  Continue tube feeding for now.  Dietitian following.  Oral intake slightly improving but not adequate yet.  DVT prophylaxis: SCDs Code Status: Full Family Communication: Spoke to wife/[name] on phone on 06/20/2022 Disposition Plan: Status is: Inpatient: Because of severity of illness  Consultants: ID/neurology/palliative care/cardiology/gastroenterology  Procedures: As above  Antimicrobials:  Anti-infectives (From admission, onward)    Start     Dose/Rate Route Frequency Ordered Stop   06/15/22 2000  fluconazole (DIFLUCAN) IVPB 200 mg  Status:  Discontinued        200 mg 100 mL/hr over 60 Minutes Intravenous Every 24 hours 06/15/22 1720 06/16/22 1501   06/14/22 1200  cefTRIAXone (ROCEPHIN) 2 g in sodium chloride 0.9 % 100 mL IVPB        2 g 200 mL/hr over 30 Minutes Intravenous Every 24 hours 06/14/22 1116 06/14/22 1725   06/12/22 1230  fluconazole (DIFLUCAN) IVPB 200 mg        200 mg 100 mL/hr over 60 Minutes Intravenous Every 24 hours 06/12/22 1134 06/12/22 1434   06/07/22 1200  vancomycin (VANCOCIN) IVPB 1000 mg/200 mL premix  Status:  Discontinued        1,000 mg 200 mL/hr over 60 Minutes Intravenous Every 36 hours 06/05/22 2323 06/06/22 1108   06/06/22 1200  cefTRIAXone (ROCEPHIN) 2 g in sodium chloride 0.9 % 100 mL IVPB  Status:  Discontinued        2 g 200 mL/hr over 30 Minutes Intravenous Every 24 hours 06/06/22 1110 06/14/22 1116   06/06/22 0200  ceFEPIme (MAXIPIME) 2 g in sodium chloride 0.9 % 100 mL IVPB  Status:  Discontinued        2 g 200 mL/hr over 30 Minutes Intravenous Every 12 hours 06/05/22 2323 06/06/22 1108   06/05/22 2315  vancomycin (VANCOCIN) IVPB 1000 mg/200 mL premix        1,000 mg 200 mL/hr over 60 Minutes Intravenous  Once 06/05/22 2307  06/06/22 0145   06/05/22 1900  cefTRIAXone (ROCEPHIN) 2 g in sodium chloride 0.9 % 100 mL IVPB  Status:  Discontinued        2 g 200 mL/hr over 30 Minutes Intravenous Every 24 hours 06/05/22 1847 06/05/22 2323   06/05/22 1900  azithromycin (ZITHROMAX) 500 mg in sodium chloride 0.9 % 250 mL IVPB  Status:  Discontinued        500 mg 250 mL/hr over 60 Minutes Intravenous Every 24 hours 06/05/22 1847 06/06/22 1108   06/05/22 1830  ceFEPIme (MAXIPIME) 2 g in sodium chloride 0.9 % 100 mL IVPB  Status:  Discontinued        2 g 200 mL/hr over 30 Minutes Intravenous  Once 06/05/22 1820 06/05/22 1847   06/05/22 1830  metroNIDAZOLE (FLAGYL) IVPB 500 mg  Status:  Discontinued        500 mg 100 mL/hr over 60 Minutes Intravenous  Once 06/05/22 1820 06/05/22 1847   06/05/22 1830  vancomycin (VANCOCIN) IVPB 1000 mg/200 mL premix  Status:  Discontinued        1,000 mg 200 mL/hr over 60 Minutes Intravenous  Once 06/05/22 1820 06/05/22 1826   06/05/22 1830  vancomycin (VANCOREADY) IVPB 1500 mg/300 mL  Status:  Discontinued        1,500 mg 150 mL/hr over 120 Minutes Intravenous  Once 06/05/22 1826 06/05/22 1847        Subjective: Patient seen and examined  at bedside.  No fever, vomiting, worsening abdominal pain reported.   Objective: Vitals:   06/24/22 1352 06/24/22 2127 06/24/22 2348 06/25/22 0641  BP: 120/83 117/80 123/73 120/72  Pulse: (!) 106 (!) 112 99 (!) 110  Resp: '18 18 20   '$ Temp: 98.5 F (36.9 C) 98 F (36.7 C) 97.8 F (36.6 C) 98.3 F (36.8 C)  TempSrc: Oral Oral Oral Oral  SpO2: 100% 100% 99% 99%  Weight:      Height:        Intake/Output Summary (Last 24 hours) at 06/25/2022 0829 Last data filed at 06/25/2022 0645 Gross per 24 hour  Intake --  Output 1600 ml  Net -1600 ml    Filed Weights   06/20/22 0429 06/22/22 0700 06/24/22 0500  Weight: 58.7 kg 58.5 kg 59.1 kg    Examination:  General: Still on room air.  No distress currently.  Chronically ill and deconditioned  looking ENT/neck: NG tube is present Respiratory: Decreased breath sounds at bases bilaterally with some scattered crackles  CVS: S1-S2 heard; tachycardic  abdominal: Soft, nontender, distended slightly; no organomegaly, bowel sounds normally heard  extremities: No cyanosis or clubbing  CNS: Awake and alert.  Slow to respond.  Poor historian.  No focal neurologic deficit.  Moving extremities lymph: No cervical lymphadenopathy noted skin: No obvious ecchymosis/rashes  psych: Flat affect.  Currently not agitated. Musculoskeletal: No obvious joint swelling/deformity    Data Reviewed: I have personally reviewed following labs and imaging studies  CBC: Recent Labs  Lab 06/18/22 0913 06/18/22 1859 06/20/22 0613 06/25/22 0504  WBC 3.1* 3.1* 2.6* 4.2  NEUTROABS  --   --   --  3.1  HGB 8.7* 7.9* 7.9* 7.6*  HCT 26.9* 23.9* 25.6* 24.2*  MCV 93.4 91.9 96.2 93.8  PLT 236 247 291 [id number]    Basic Metabolic Panel: Recent Labs  Lab 06/20/22 0613 06/22/22 0528 06/25/22 0504  NA 138 138 134*  K 4.7 4.2 4.1  CL 106 105 103  CO2 '25 23 24  '$ GLUCOSE 129* 134* 143*  BUN 38* 37* 32*  CREATININE 0.57* 0.49* 0.59*  CALCIUM 8.7* 9.1 8.6*  MG 1.8 1.8 1.8  PHOS  --   --  4.3    GFR: Estimated Creatinine Clearance: 69.8 mL/min (A) (by C-G formula based on SCr of 0.59 mg/dL (L)). Liver Function Tests: No results for input(s): "AST", "ALT", "ALKPHOS", "BILITOT", "PROT", "ALBUMIN" in the last 168 hours. No results for input(s): "LIPASE", "AMYLASE" in the last 168 hours. No results for input(s): "AMMONIA" in the last 168 hours. Coagulation Profile: No results for input(s): "INR", "PROTIME" in the last 168 hours. Cardiac Enzymes: No results for input(s): "CKTOTAL", "CKMB", "CKMBINDEX", "TROPONINI" in the last 168 hours. BNP (last 3 results) No results for input(s): "PROBNP" in the last 8760 hours. HbA1C: No results for input(s): "HGBA1C" in the last 72 hours. CBG: Recent Labs  Lab  06/19/22 2016 06/20/22 0009 06/20/22 0405 06/20/22 0816 06/20/22 1203  GLUCAP 130* 168* 157* 142* 120*    Lipid Profile: No results for input(s): "CHOL", "HDL", "LDLCALC", "TRIG", "CHOLHDL", "LDLDIRECT" in the last 72 hours. Thyroid Function Tests: No results for input(s): "TSH", "T4TOTAL", "FREET4", "T3FREE", "THYROIDAB" in the last 72 hours. Anemia Panel: No results for input(s): "VITAMINB12", "FOLATE", "FERRITIN", "TIBC", "IRON", "RETICCTPCT" in the last 72 hours. Sepsis Labs: No results for input(s): "PROCALCITON", "LATICACIDVEN" in the last 168 hours.  No results found for this or any previous visit (from the past 240 hour(s)).  Radiology Studies: No results found.      Scheduled Meds:  acetaminophen (TYLENOL) oral liquid 160 mg/5 mL  1,000 mg Oral TID   aspirin  81 mg Oral Daily   atorvastatin  20 mg Oral Daily   cholestyramine light  4 g Oral QID   clopidogrel  75 mg Oral Daily   feeding supplement  237 mL Oral TID BM   feeding supplement (PROSource TF20)  60 mL Per Tube Daily   lidocaine  15 mL Mouth/Throat TID AC & HS   losartan  50 mg Oral Daily   metoprolol tartrate  25 mg Oral BID   mirtazapine  7.5 mg Oral QHS   nystatin  5 mL Oral QID   mouth rinse  15 mL Mouth Rinse 4 times per day   pantoprazole  40 mg Oral Daily   sucralfate  1 g Oral TID WC & HS   Continuous Infusions:  sodium chloride 10 mL/hr at 06/15/22 2022   feeding supplement (OSMOLITE 1.5 CAL) 1,000 mL (06/24/22 1934)          Aline August, MD [facility] 06/25/2022, 8:29 AM

## 2022-06-26 ENCOUNTER — Inpatient Hospital Stay (HOSPITAL_COMMUNITY): Payer: Medicare Other

## 2022-06-26 ENCOUNTER — Ambulatory Visit
Admission: RE | Admit: 2022-06-26 | Discharge: 2022-06-26 | Disposition: A | Discharge status: Home / Self Care | Payer: Medicare Other | Source: Ambulatory Visit | Attending: Radiation Oncology | Admitting: Radiation Oncology

## 2022-06-26 ENCOUNTER — Ambulatory Visit: Payer: Medicare Other

## 2022-06-26 ENCOUNTER — Other Ambulatory Visit: Payer: Self-pay

## 2022-06-26 DIAGNOSIS — R6520 Severe sepsis without septic shock: Secondary | ICD-10-CM | POA: Diagnosis not present

## 2022-06-26 DIAGNOSIS — R1319 Other dysphagia: Secondary | ICD-10-CM | POA: Diagnosis not present

## 2022-06-26 DIAGNOSIS — A419 Sepsis, unspecified organism: Secondary | ICD-10-CM | POA: Diagnosis not present

## 2022-06-26 DIAGNOSIS — Z510 Encounter for antineoplastic radiation therapy: Secondary | ICD-10-CM | POA: Diagnosis not present

## 2022-06-26 DIAGNOSIS — C3412 Malignant neoplasm of upper lobe, left bronchus or lung: Secondary | ICD-10-CM | POA: Diagnosis not present

## 2022-06-26 DIAGNOSIS — C771 Secondary and unspecified malignant neoplasm of intrathoracic lymph nodes: Secondary | ICD-10-CM | POA: Diagnosis not present

## 2022-06-26 DIAGNOSIS — K208 Other esophagitis without bleeding: Secondary | ICD-10-CM | POA: Diagnosis not present

## 2022-06-26 DIAGNOSIS — F17210 Nicotine dependence, cigarettes, uncomplicated: Secondary | ICD-10-CM | POA: Diagnosis not present

## 2022-06-26 LAB — RAD ONC ARIA SESSION SUMMARY
Course Elapsed Days: 42
Plan Fractions Treated to Date: 28
Plan Prescribed Dose Per Fraction: 2 Gy
Plan Total Fractions Prescribed: 30
Plan Total Prescribed Dose: 60 Gy
Reference Point Dosage Given to Date: 56 Gy
Reference Point Session Dosage Given: 2 Gy
Session Number: 28

## 2022-06-26 NOTE — Progress Notes (Signed)
PROGRESS NOTE    Jesse [name]  [id number] DOB: Aug 29, 1950 DOA: 06/05/2022 PCP: [name], MD   Brief Narrative:  72 year old male with history of GIST of rectum s/p resection/ileostomy with subsequent ileostomy takedown on 03/22/2022, currently on imatinib; history of recurrent adenocarcinoma of left lung currently on concurrent radiation and chemotherapy presented with confusion, hypoxia.  He was found to have sepsis with Klebsiella bacteremia and pneumonia and also a small acute embolic ischemic stroke. Patient seen by cardiology, palliative care, neurology and gastroenterology in consultation. Echo showed slightly reduced EF. Hospitalization complicated with persistently poor oral intake managed with tube feeding, GI following. EGD done 3/4-confirmed radiation esophagitis and a small stricture that was dilated.  PT recommending SNF placement.    Assessment & Plan:   Odynophagia/dysphagia Oral thrush Anorexia Radiation esophagitis Schatzki ring Suspected esophageal candidiasis -Status post EGD on 06/17/2022: Radiation injury in the mid/upper esophagus from 25-33 cm (biopsied). Benign appearing Schatzki ring in the lower esophagus, dilated with 18 mm TTS balloon. No clear esophageal candida on this study, but could be due to him being on therapy for a few days now.  Pathology suggestive of possible radiation injury, no malignancy. -Diflucan had to be discontinued because of interaction with Plavix.  Currently on nystatin. -GI has signed off and recommended Protonix 40 mg daily and Carafate -Oral slightly improving but still not adequate.  Encourage oral intake.  Continue tube feeding diet.  Dietitian following.  Severe sepsis: Present on admission Klebsiella pneumonia bacteremia and pneumonia -Completed 10 days of Rocephin on 06/06/2022.  ID has signed off.  Sepsis has resolved.  Acute metabolic encephalopathy -Multifactorial.  Improved.  Still slow to respond.  Monitor mental  status.  Acute CVA, incidentally found on MRI brain -Seen by neurology thought to be embolic: started on aspirin and Plavix.  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  Seen by cardiology:recommended Zio patch upon discharge   Cardiomyopathy -LVEF 40 to 45%.  No further ischemic workup as per cardiology.  Continue metoprolol and as needed Lasix.  Continue strict input output and daily weights.  Acute on chronic diarrhea -Stool for C. difficile positive but toxin negative.  GI pathogen negative.  Continue Questran (increased dose on 06/22/2022), enteric precaution, symptomatic management.  Still having intermittent diarrhea but improving.  Hypertension  Hyperlipidemia -Blood pressure stable.  Continue losartan, metoprolol tartrate -Continue statin  Leukopenia -Status post filgrastim x 3 for neutropenia.  Resolved.  Anemia of chronic disease -From malignancy and prognosis.  Hemoglobin 7.6 on 06/25/2022.  Monitor intermittently.  Transfused packed red cells on 06/10/2022.  Hyponatremia -Sodium 134 on 06/25/2019.  Monitor intermittently.  Encourage oral intake.  Recurrent adenocarcinoma of the lung -Diagnosed in 2021. -S/P left upper lobectomy with lymph node dissection on December 16, 2019,repeat video bronchoscopy with EBUS on April 11, 2022 and biopsy from the station 7 lymph node showed recurrent adenocarcinoma. Followed by Dr. Julien Nordmann, currently was undergoing a course of concurrent chemoradiation  -Outpatient follow-up with oncology  GIST tumor of rectum -Treated with imatinib  -s/p  robotic assisted very low anterior rectosigmoid resection with coloanal anastomosis, diverting loop ileostomy and wedge liver biopsy under the care of Dr. Johney Maine on December 14, 2021 and it showed minimal residual  gastrointestinal stromal tumor.  Status post ileostomy takedown on 03/22/2022 -Imatinib on hold.  Outpatient follow-up with Dr. Julien Nordmann  Debility/physical deconditioning -PT OT recommending SNF placement.  TOC following.  Goals of care -Remains full code.  Palliative care following intermittently.  Severe malnutrition -Oral intake still poor.  Encourage oral intake.  Continue tube feeding for now.  Dietitian following.  Oral intake slightly improving but not adequate yet.  DVT prophylaxis: SCDs Code Status: Full Family Communication: Spoke to wife/[name] on phone on 06/20/2022 Disposition Plan: Status is: Inpatient: Because of severity of illness  Consultants: ID/neurology/palliative care/cardiology/gastroenterology  Procedures: As above  Antimicrobials:  Anti-infectives (From admission, onward)    Start     Dose/Rate Route Frequency Ordered Stop   06/15/22 2000  fluconazole (DIFLUCAN) IVPB 200 mg  Status:  Discontinued        200 mg 100 mL/hr over 60 Minutes Intravenous Every 24 hours 06/15/22 1720 06/16/22 1501   06/14/22 1200  cefTRIAXone (ROCEPHIN) 2 g in sodium chloride 0.9 % 100 mL IVPB        2 g 200 mL/hr over 30 Minutes Intravenous Every 24 hours 06/14/22 1116 06/14/22 1725   06/12/22 1230  fluconazole (DIFLUCAN) IVPB 200 mg        200 mg 100 mL/hr over 60 Minutes Intravenous Every 24 hours 06/12/22 1134 06/12/22 1434   06/07/22 1200  vancomycin (VANCOCIN) IVPB 1000 mg/200 mL premix  Status:  Discontinued        1,000 mg 200 mL/hr over 60 Minutes Intravenous Every 36 hours 06/05/22 2323 06/06/22 1108   06/06/22 1200  cefTRIAXone (ROCEPHIN) 2 g in sodium chloride 0.9 % 100 mL IVPB  Status:  Discontinued        2 g 200 mL/hr over 30 Minutes Intravenous Every 24 hours 06/06/22 1110 06/14/22 1116   06/06/22 0200  ceFEPIme (MAXIPIME) 2 g in sodium chloride 0.9 % 100 mL IVPB  Status:  Discontinued        2 g 200 mL/hr over 30 Minutes Intravenous Every 12 hours 06/05/22 2323  06/06/22 1108   06/05/22 2315  vancomycin (VANCOCIN) IVPB 1000 mg/200 mL premix        1,000 mg 200 mL/hr over 60 Minutes Intravenous  Once 06/05/22 2307 06/06/22 0145   06/05/22 1900  cefTRIAXone (ROCEPHIN) 2 g in sodium chloride 0.9 % 100 mL IVPB  Status:  Discontinued        2 g 200 mL/hr over 30 Minutes Intravenous Every 24 hours 06/05/22 1847 06/05/22 2323   06/05/22 1900  azithromycin (ZITHROMAX) 500 mg in sodium chloride 0.9 % 250 mL IVPB  Status:  Discontinued        500 mg 250 mL/hr over 60 Minutes Intravenous Every 24 hours 06/05/22 1847 06/06/22 1108   06/05/22 1830  ceFEPIme (MAXIPIME) 2 g in sodium chloride 0.9 % 100 mL IVPB  Status:  Discontinued        2 g 200 mL/hr over 30 Minutes Intravenous  Once 06/05/22 1820 06/05/22 1847   06/05/22 1830  metroNIDAZOLE (FLAGYL) IVPB 500 mg  Status:  Discontinued        500 mg 100 mL/hr over 60 Minutes Intravenous  Once 06/05/22 1820 06/05/22 1847   06/05/22 1830  vancomycin (VANCOCIN) IVPB 1000 mg/200 mL premix  Status:  Discontinued        1,000 mg 200 mL/hr over 60 Minutes Intravenous  Once 06/05/22 1820 06/05/22 1826   06/05/22 1830  vancomycin (VANCOREADY) IVPB 1500 mg/300 mL  Status:  Discontinued        1,500 mg  150 mL/hr over 120 Minutes Intravenous  Once 06/05/22 1826 06/05/22 1847        Subjective: Patient seen and examined at bedside.  No worsening shortness of breath, fever, vomiting or seizures reported.   Objective: Vitals:   06/25/22 0641 06/25/22 1424 06/25/22 2313 06/26/22 0715  BP: 120/72 (!) 148/78 115/76 124/80  Pulse: (!) 110 (!) 108 (!) 110 (!) 107  Resp:  '18 18 20  '$ Temp: 98.3 F (36.8 C) (!) 97.4 F (36.3 C) 97.6 F (36.4 C) 98.4 F (36.9 C)  TempSrc: Oral Oral Oral Oral  SpO2: 99% 100% 96% 96%  Weight:      Height:        Intake/Output Summary (Last 24 hours) at 06/26/2022 0837 Last data filed at 06/26/2022 0700 Gross per 24 hour  Intake 1303.08 ml  Output 1150 ml  Net 153.08 ml    Filed  Weights   06/20/22 0429 06/22/22 0700 06/24/22 0500  Weight: 58.7 kg 58.5 kg 59.1 kg    Examination:  General: No distress.  On room air.  Chronically ill and deconditioned looking ENT/neck: Has NG tube  respiratory: Bilateral decreased breath sounds at bases with scattered crackles CVS: Still tachycardic; S1 and S2 are heard abdominal: Soft, nontender, mildly distended; no organomegaly, normal bowel sounds heard  extremities: Trace lower extremity edema; no cyanosis CNS: Alert and.  Still slow to respond.  Poor historian.  No focal neurologic deficit.  Moves extremities lymph: No palpable lymphadenopathy skin: No obvious lesions/petechiae  psych: Not agitated.  Extremely flat affect. Musculoskeletal: No obvious joint swelling/deformity    Data Reviewed: I have personally reviewed following labs and imaging studies  CBC: Recent Labs  Lab 06/20/22 0613 06/25/22 0504  WBC 2.6* 4.2  NEUTROABS  --  3.1  HGB 7.9* 7.6*  HCT 25.6* 24.2*  MCV 96.2 93.8  PLT 291 [id number]    Basic Metabolic Panel: Recent Labs  Lab 06/20/22 0613 06/22/22 0528 06/25/22 0504  NA 138 138 134*  K 4.7 4.2 4.1  CL 106 105 103  CO2 '25 23 24  '$ GLUCOSE 129* 134* 143*  BUN 38* 37* 32*  CREATININE 0.57* 0.49* 0.59*  CALCIUM 8.7* 9.1 8.6*  MG 1.8 1.8 1.8  PHOS  --   --  4.3    GFR: Estimated Creatinine Clearance: 69.8 mL/min (A) (by C-G formula based on SCr of 0.59 mg/dL (L)). Liver Function Tests: No results for input(s): "AST", "ALT", "ALKPHOS", "BILITOT", "PROT", "ALBUMIN" in the last 168 hours. No results for input(s): "LIPASE", "AMYLASE" in the last 168 hours. No results for input(s): "AMMONIA" in the last 168 hours. Coagulation Profile: No results for input(s): "INR", "PROTIME" in the last 168 hours. Cardiac Enzymes: No results for input(s): "CKTOTAL", "CKMB", "CKMBINDEX", "TROPONINI" in the last 168 hours. BNP (last 3 results) No results for input(s): "PROBNP" in the last 8760  hours. HbA1C: No results for input(s): "HGBA1C" in the last 72 hours. CBG: Recent Labs  Lab 06/19/22 2016 06/20/22 0009 06/20/22 0405 06/20/22 0816 06/20/22 1203  GLUCAP 130* 168* 157* 142* 120*    Lipid Profile: No results for input(s): "CHOL", "HDL", "LDLCALC", "TRIG", "CHOLHDL", "LDLDIRECT" in the last 72 hours. Thyroid Function Tests: No results for input(s): "TSH", "T4TOTAL", "FREET4", "T3FREE", "THYROIDAB" in the last 72 hours. Anemia Panel: No results for input(s): "VITAMINB12", "FOLATE", "FERRITIN", "TIBC", "IRON", "RETICCTPCT" in the last 72 hours. Sepsis Labs: No results for input(s): "PROCALCITON", "LATICACIDVEN" in the last 168 hours.  No results found for this  or any previous visit (from the past 240 hour(s)).       Radiology Studies: No results found.      Scheduled Meds:  acetaminophen (TYLENOL) oral liquid 160 mg/5 mL  1,000 mg Oral TID   aspirin  81 mg Oral Daily   atorvastatin  20 mg Oral Daily   cholestyramine light  4 g Oral QID   clopidogrel  75 mg Oral Daily   feeding supplement  237 mL Oral TID BM   feeding supplement (PROSource TF20)  60 mL Per Tube Daily   lidocaine  15 mL Mouth/Throat TID AC & HS   losartan  50 mg Oral Daily   metoprolol tartrate  25 mg Oral BID   mirtazapine  7.5 mg Oral QHS   nystatin  5 mL Oral QID   mouth rinse  15 mL Mouth Rinse 4 times per day   pantoprazole  40 mg Oral Daily   sucralfate  1 g Oral TID WC & HS   Continuous Infusions:  sodium chloride 10 mL/hr at 06/15/22 2022   feeding supplement (OSMOLITE 1.5 CAL) 1,000 mL (06/25/22 1744)          Aline August, MD [facility] 06/26/2022, 8:37 AM

## 2022-06-26 NOTE — Progress Notes (Signed)
Mobility Specialist - Progress Note   06/26/22 0941  Mobility  Activity Transferred from bed to chair;Stood at bedside  Level of Assistance Contact guard assist, steadying assist  Assistive Device Front wheel walker  Distance Ambulated (ft) 5 ft  Activity Response Tolerated well  Mobility Referral Yes  $Mobility charge 1 Mobility   Pt received in bed and agreeable to transfer to recliner. Pt found soiled & assisted w/ cleaning up. Throughout cleaning up & transferring pt c/o butt feeling sore as well as pain in shoulder. Nurse made aware of occurrences. No other complaints during session. Pt to recliner for breakfast with all needs met & call bell in reach. Pt did state he felt stronger today & may be up to ambulating this afternoon.    [facility]

## 2022-06-26 NOTE — Plan of Care (Signed)
  Problem: Clinical Measurements: Goal: Diagnostic test results will improve Outcome: Progressing   Problem: Clinical Measurements: Goal: Signs and symptoms of infection will decrease Outcome: Progressing   Problem: Respiratory: Goal: Ability to maintain adequate ventilation will improve Outcome: Progressing   

## 2022-06-26 NOTE — Progress Notes (Addendum)
Request received for G tube placement.   CT AP with from 04/26/22 reviewed by Dr. Kathlene Cote,  the CT showed significant ileus, will need updated CT Abdomen for evaluation. CT ordered.   Patient is on DAPT with Plavix 75 mg and ASA 81 mg QD due to recent finding of acute CVA on MR brian 06/11/22, seen by neurology on 2/26, CTA head/neck on 06/12/22 negative for large vessel occlusion or other emergent finding.  Both Plavix and ASA requires 5 day hold for G tube placement, unclear if this can be achieved due to recent CVA.  Please consider checking with neurology to see if Plavix and ASA can be discontinued for 5 days. Ordering MD notified.   If Plavix can be discontinues before tomorrow, the earliest G tube can be placed is Monday 07/02/22.   IR will review CT abdomen when it is done.  Please call IR for questions and concerns.   ADDENDUM Patient can be off Plavix x 5 days per Dr. Lorrin Goodell from neurology, but recommends ASA to be continued till 1 day before the procedure.  Plavix discontinued, G tube placement is tentatively scheduled for Monday 3/18 IF anatomy is amenable.  IR will follow.    Armando Gang Meloni Hinz PA-C 06/26/2022 2:47 PM

## 2022-06-26 NOTE — Progress Notes (Signed)
Nutrition Follow-up  DOCUMENTATION CODES:   Severe malnutrition in context of chronic illness  INTERVENTION:  - DYS 3 diet.  - Provided handout "Maximizing Nutrition  During Cancer Treatment" for tips on dealing with taste changes and low appetite.  -Ensure Plus High Protein po TID, each supplement provides 350 kcal and 20 grams of protein.  - Pt's wife also bringing in [employer] and Ensure Compleat from home.   - Continue cyclic tube feed of 16 hours (off 8 hours during the day) to help promote better appetite and encourage intake: Osmolite 1.5 @ 79m/hr x16 hours (1600-0800) via NGT (verified in gastric fundus per MD read of xray) 60 ml Prosource TF daily -Tube feeding regimen provides 2120 kcals, 105 grams of protein, and 1036 ml of H2O.     - Once patient begins showing more consistent and improved oral intake (solid foods and consistent acceptance of nutrition supplements), can consider running calorie count at that time.   - Do not feel patient is there at this time. - Will monitor intake closely.   - IR now consulted for G tube placement.   - Monitor weight trends.    NUTRITION DIAGNOSIS:   Severe Malnutrition related to chronic illness (lung cancer currently undergoing chemotherapy and radiation therapy) as evidenced by severe fat depletion, severe muscle depletion, percent weight loss (15% in 6 months). *ongoing  GOAL:   Patient will meet greater than or equal to 90% of their needs *met with TF, unmet with oral intake  MONITOR:   PO intake, Diet advancement, Labs, Weight trends, TF tolerance  REASON FOR ASSESSMENT:   Consult Assessment of nutrition requirement/status, Calorie Count  ASSESSMENT:   72y.o. male with medical history significant of lung cancer currently undergoing chemotherapy and radiation therapy, HTN, HLD, history of bladder cancer, GERD, hypokalemia, recurrent anemia, COPD, macular degeneration who presented with severe fatigue and  confusion.  2/20 -admitted 2/22- NGT removed by pt, TF held 2/2 vomiting, NGT replaced (10 french small bore feeding tube- weighted feeding catheter tip in distal stomach per KUB) 2/23- s/po BSE- advanced to dysphagia 3 diet with thin liquids 2/24- TF re-started 2/26- Calorie count  2/29 2nd calorie count 3/4 TF transition to 16 hour cyclic overnight ([id number] to promote better appetite 3/12 IR consult for G tube ordered  Met with patient at bedside this afternoon. Wife not present in room today.   Patient at first reported he didn't have anything to eat today and then stated he had a lot at breakfast. Per RN, patient only had juice at breakfast.  Pt endorses his appetite remains poor and he is now having taste changes from radiation. Provided handout "Maximizing Nutrition  During Cancer Treatment" from the Academy of [employer]etics, including tips for combating taste changes as well as tips for increasing intake with a low appetite. Encouraged patient to review handout, will review handout with wife when able.   Patient admits that it has been hard to eat well as he doesn't like available hospital food. He is used to his wife's great home cooking and the current food doesn't compare. Per RN, wife has been bringing patient in food to encourage his intake. But even when she encourages intake and brings food in he will still only eat a few bites.  Several nutrition supplements (Ensure Compleat and Premier Protein) sitting in room, brought in by wife. Patient endorses occasionally drinking them but not really loving them. He has intermittently accepted Ensure from RN.  Encouraged intake of supplements whenever able and trying to eat something at all three meals daily. Discussed trying different food options on the menu. Patient agreeable to have pot roast and mashed potatoes for dinner and an omelet with cheese and ham and cheerios for breakfast tomorrow. RD ordered his desired  meals.  Patient remains on cyclic TF running 16 hours a day to promote appetite. He endorses they have been going well.   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  However, RN reports wife mentioned a permanent tube was initially brought up but not mentioned again. Wife now agreeable and IR consult for G tube placement ordered this afternoon.  Per IR's note, earliest patient could receive tube is next Monday.   Medications reviewed and include: Remeron, Carafate  Labs reviewed:  Na 134   Diet Order:   Diet Order             DIET DYS 3 Room service appropriate? Yes; Fluid consistency: Thin  Diet effective now                   EDUCATION NEEDS:  Education needs have been addressed  Skin:  Skin Assessment: Skin Integrity Issues: Skin Integrity Issues:: Other (Comment) Other: Non-pressure wound on vertebral column  Last BM:  3/11  Height:  Ht Readings from Last 1 Encounters:  06/18/22 '5\' 9"'$  (1.753 m)   Weight:  Wt Readings from Last 1 Encounters:  06/24/22 59.1 kg    BMI:  Body mass index is 19.24 kg/m.  Estimated Nutritional Needs:  Kcal:  1950-2300 kcals Protein:  110-130 grams Fluid:  >/= 1.9L    Samson Frederic RD, LDN For contact information, refer to [facility].

## 2022-06-27 ENCOUNTER — Ambulatory Visit: Payer: Medicare Other

## 2022-06-27 ENCOUNTER — Other Ambulatory Visit: Payer: Self-pay

## 2022-06-27 ENCOUNTER — Inpatient Hospital Stay (HOSPITAL_COMMUNITY): Payer: Medicare Other

## 2022-06-27 ENCOUNTER — Ambulatory Visit
Admission: RE | Admit: 2022-06-27 | Discharge: 2022-06-27 | Disposition: A | Discharge status: Home / Self Care | Payer: Medicare Other | Source: Ambulatory Visit | Attending: Radiation Oncology | Admitting: Radiation Oncology

## 2022-06-27 DIAGNOSIS — F17210 Nicotine dependence, cigarettes, uncomplicated: Secondary | ICD-10-CM | POA: Diagnosis not present

## 2022-06-27 DIAGNOSIS — R6520 Severe sepsis without septic shock: Secondary | ICD-10-CM | POA: Diagnosis not present

## 2022-06-27 DIAGNOSIS — A419 Sepsis, unspecified organism: Secondary | ICD-10-CM | POA: Diagnosis not present

## 2022-06-27 DIAGNOSIS — C771 Secondary and unspecified malignant neoplasm of intrathoracic lymph nodes: Secondary | ICD-10-CM | POA: Diagnosis not present

## 2022-06-27 DIAGNOSIS — C3412 Malignant neoplasm of upper lobe, left bronchus or lung: Secondary | ICD-10-CM | POA: Diagnosis not present

## 2022-06-27 DIAGNOSIS — Z510 Encounter for antineoplastic radiation therapy: Secondary | ICD-10-CM | POA: Diagnosis not present

## 2022-06-27 LAB — CBC WITH DIFFERENTIAL/PLATELET
Abs Immature Granulocytes: 0.21 10*3/uL — ABNORMAL HIGH (ref 0.00–0.07)
Basophils Absolute: 0 10*3/uL (ref 0.0–0.1)
Basophils Relative: 0 %
Eosinophils Absolute: 0.1 10*3/uL (ref 0.0–0.5)
Eosinophils Relative: 3 %
HCT: 25.2 % — ABNORMAL LOW (ref 39.0–52.0)
Hemoglobin: 7.8 g/dL — ABNORMAL LOW (ref 13.0–17.0)
Immature Granulocytes: 4 %
Lymphocytes Relative: 8 %
Lymphs Abs: 0.4 10*3/uL — ABNORMAL LOW (ref 0.7–4.0)
MCH: 29.4 pg (ref 26.0–34.0)
MCHC: 31 g/dL (ref 30.0–36.0)
MCV: 95.1 fL (ref 80.0–100.0)
Monocytes Absolute: 0.5 10*3/uL (ref 0.1–1.0)
Monocytes Relative: 10 %
Neutro Abs: 3.9 10*3/uL (ref 1.7–7.7)
Neutrophils Relative %: 75 %
Platelets: 397 10*3/uL (ref 150–400)
RBC: 2.65 MIL/uL — ABNORMAL LOW (ref 4.22–5.81)
RDW: 15 % (ref 11.5–15.5)
WBC: 5.2 10*3/uL (ref 4.0–10.5)
nRBC: 0 % (ref 0.0–0.2)

## 2022-06-27 LAB — BASIC METABOLIC PANEL
Anion gap: 6 (ref 5–15)
BUN: 27 mg/dL — ABNORMAL HIGH (ref 8–23)
CO2: 24 mmol/L (ref 22–32)
Calcium: 8.5 mg/dL — ABNORMAL LOW (ref 8.9–10.3)
Chloride: 104 mmol/L (ref 98–111)
Creatinine, Ser: 0.45 mg/dL — ABNORMAL LOW (ref 0.61–1.24)
GFR, Estimated: >60 mL/min (ref >60)
Glucose, Bld: 141 mg/dL — ABNORMAL HIGH (ref 70–99)
Potassium: 4.3 mmol/L (ref 3.5–5.1)
Sodium: 134 mmol/L — ABNORMAL LOW (ref 135–145)

## 2022-06-27 LAB — RAD ONC ARIA SESSION SUMMARY
Course Elapsed Days: 43
Plan Fractions Treated to Date: 29
Plan Prescribed Dose Per Fraction: 2 Gy
Plan Total Fractions Prescribed: 30
Plan Total Prescribed Dose: 60 Gy
Reference Point Dosage Given to Date: 58 Gy
Reference Point Session Dosage Given: 2 Gy
Session Number: 29

## 2022-06-27 LAB — MAGNESIUM: Magnesium: 1.7 mg/dL (ref 1.7–2.4)

## 2022-06-27 MED ORDER — ENOXAPARIN SODIUM 40 MG/0.4ML IJ SOSY
40.0000 mg | PREFILLED_SYRINGE | INTRAMUSCULAR | Status: DC
  Administered 2022-06-27 – 2022-07-02 (×5): 40 mg via SUBCUTANEOUS
  Filled 2022-06-27 (×5): qty 0.4

## 2022-06-27 NOTE — Progress Notes (Signed)
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 Lactic acid was 2.6 CK was 764. FOB was positive C. difficile antigen positive but toxin and PCR negative. Chest x-ray revealed right infrahilar airspace opacity.  He was found to have Klebsiella bacteremia and pneumonia and a small acute embolic CVA.  The patient continued to have poor oral intake and tube feeds have been continued.  3/4, EGD revealed radiation esophagitis and a small esophageal stricture which was dilated.  Subjective:  The patient has no complaints.  Assessment and Plan: Principal Problem:   Severe sepsis due to Klebsiella bacteremia and pneumonia -ID consulted for assistance -He completed 10 days of ceftriaxone on 2/21 - repeat blood cultures negattive  Active Problems: Acute metabolic encephalopathy - Patient remains confused to month year and age.  He is aware he is at [facility] - according to his wife, his confusion started when he came to the hospital and is appears to be improving  New, bibasilar tree-in-bud pulmonary nodularities, suspicious for multifocal pneumonia - hold APAP and follow for fevers  Incidental CVA - Started on aspirin Plavix - Cardiology recommended Zio patch upon discharge- since the patient has been here for 3 weeks, I do not feel that he will require it  Chronic systolic heart failure - EF 40 to 45% on echo - Continue metoprolol and Losartan -  Norvasc held  Radiation esophagitis with esophageal stricture - Continue PPI and sucralfate - he has completed Diflucan - path report reveals exudate and inflamed granulation tissue consistent with an ulcer- no malignancy  Severe malnutrition - Not eating very well- 0-25% of meals - per wife he has not eaten in weeks -Currently receiving Osmolite 85 cc/h, Prosource and mirtazapine - PEG tube planned for 3/18 -Plavix will need to be held for 5 days and aspirin for 24 hours prior to the procedure - Have discussed with neurology that it is okay to hold these medications in setting of a subacute CVA - CT scan completed yesterday   Acute on chronic diarrhea - Questran increased on 3/8  Adenocarcinoma of the lung, recurrent -Neutropenia  - Initial diagnosis was in 2001 - He is status post left upper lobectomy in 12/16/2019 - In 04/11/2022 he had an EBUS and a biopsy from a lymph node which revealed recurrent adenocarcinoma - he is currently receiving Imatinib - last radiation treatment today -Continue resolved after 3 days of filgrastim  History of GIST tumor of the rectum  Normocytic anemia - Hgb 7.8  Deconditioning - PT/OT recommending skilled nursing facility    Updated his wife.      Code Status: Full Code Consultants: GI, Palliative, Cardiology, neurology, ID Level of Care: Level of care: Telemetry Total time on patient care: 40 min DVT prophylaxis:  Lovenox, SCDs Start: 06/06/22 0153   Objective:   Vitals:   06/26/22 2002 06/27/22 0642 06/27/22 0714 06/27/22 1350  BP:  107/65 122/72  112/75  Pulse: (!) 107 (!) 110  (!) 105  Resp:  18    Temp: 97.7 F (36.5 C) 98.2 F (36.8 C)  97.8 F (36.6 C)  TempSrc: Oral Oral  Oral  SpO2: 98% 98%  100%  Weight:   59.9 kg   Height:       Filed Weights   06/22/22 0700 06/24/22 0500 06/27/22 0714  Weight: 58.5 kg 59.1 kg 59.9 kg   Exam: General exam: Appears comfortable  HEENT: oral mucosa moist- NG tube  present Respiratory system: Clear to auscultation.  Cardiovascular system: S1 & S2 heard  Gastrointestinal system: Abdomen soft, non-tender, nondistended. Normal bowel sounds   Extremities: No cyanosis, clubbing or edema Neuro: not oriented to time or age Psychiatry:  Mood & affect appropriate.      CBC: Recent Labs  Lab 06/25/22 0504 06/27/22 0552  WBC 4.2 5.2  NEUTROABS 3.1 3.9  HGB 7.6* 7.8*  HCT 24.2* 25.2*  MCV 93.8 95.1  PLT 389 [id number]   Basic Metabolic Panel: Recent Labs  Lab 06/22/22 0528 06/25/22 0504 06/27/22 0552  NA 138 134* 134*  K 4.2 4.1 4.3  CL 105 103 104  CO2 '23 24 24  '$ GLUCOSE 134* 143* 141*  BUN 37* 32* 27*  CREATININE 0.49* 0.59* 0.45*  CALCIUM 9.1 8.6* 8.5*  MG 1.8 1.8 1.7  PHOS  --  4.3  --    GFR: Estimated Creatinine Clearance: 70.7 mL/min (A) (by C-G formula based on SCr of 0.45 mg/dL (L)).  Scheduled Meds:  acetaminophen (TYLENOL) oral liquid 160 mg/5 mL  1,000 mg Oral TID   aspirin  81 mg Oral Daily   atorvastatin  20 mg Oral Daily   cholestyramine light  4 g Oral QID   feeding supplement  237 mL Oral TID BM   feeding supplement (PROSource TF20)  60 mL Per Tube Daily   lidocaine  15 mL Mouth/Throat TID AC & HS   losartan  50 mg Oral Daily   metoprolol tartrate  25 mg Oral BID   mirtazapine  7.5 mg Oral QHS   nystatin  5 mL Oral QID   mouth rinse  15 mL Mouth Rinse 4 times per day   pantoprazole  40 mg Oral Daily   sucralfate  1 g Oral TID WC & HS   Continuous Infusions:  sodium chloride 10 mL/hr at 06/15/22 2022   feeding supplement (OSMOLITE 1.5 CAL) Stopped (06/27/22 0800)   Imaging and lab data was personally reviewed CT ABDOMEN WO CONTRAST  Result Date: 06/27/2022 CLINICAL DATA:  anatomy eval for possible G tube placement EXAM: CT ABDOMEN WITHOUT CONTRAST TECHNIQUE: Multidetector CT imaging of the abdomen was performed following the standard protocol without IV contrast. RADIATION DOSE REDUCTION: This exam was performed  according to the departmental dose-optimization program which includes automated exposure control, adjustment of the mA and/or kV according to patient size and/or use of iterative reconstruction technique. COMPARISON:  CT AP, 04/26/2022 and 03/24/2021 FINDINGS: Lower chest: Calcified coronary atherosclerosis. 1.7 cm peripheral RIGHT basilar pulmonary cyst. Interval development of bibasilar dependent tree-in-bud nodularities. Hepatobiliary: Normal noncontrast appearance of the liver without focal abnormality. No gallstones, gallbladder wall thickening, or biliary dilatation. Pancreas: No pancreatic ductal dilatation or surrounding inflammatory changes. Spleen: Normal in size without focal abnormality. Adrenals/Urinary Tract: Adrenal glands are unremarkable. Trace bilateral perirenal stranding. Bilateral punctate nonobstructing nephrolithiasis. No hydronephrosis. Stomach/Bowel: Small bore enteric feeding tube, with tip within the antral stomach. Stomach mildly distended  and is within normal limits. Mild prominence of air distended small bowel loop medial to the stomach. Distal small bowel anastomosis, incompletely assessed on this evaluation. No bowel obstruction. Vascular/Lymphatic: Moderate-to-severe burden of aortic atherosclerosis without aneurysmal dilatation. No enlarged abdominal lymph nodes. Other: Scaphoid abdomen.  No abdominal wall hernia or abnormality. Musculoskeletal: Multilevel degenerative changes of imaged thoracolumbar spine. No acute osseous findings. IMPRESSION: 1. New, bibasilar tree-in-bud pulmonary nodularities, suspicious for multifocal pneumonia. 2. Anatomy is potentially amenable for percutaneous gastrostomy placement, in consideration of mildly prominent air distended bowel loop medial to the stomach. Final decision making per performing clinician. Consider preprocedural PO contrast opacification of bowel and colon for better visualization. 3. Gastric placement of enteric feeding tube. 4.  Nonobstructing nephrolithiasis and aortic Atherosclerosis (ICD10-I70.0). Additional incidental, chronic and senescent findings as above. Michaelle Birks, MD [facility] Electronically Signed   By: Michaelle Birks M.D.   On: 06/27/2022 10:28    LOS: 22 days   Author: Debbe Odea  06/27/2022 3:11 PM  To contact [facility]>   Check the care team in [facility] and look for the attending/consulting [place] provider listed  Log into [email or web address] and use [facility] universal password   Go to> "[facility]"  and find provider  If you still have difficulty reaching the provider, please page the [facility] (Director on Call) for the Hospitalists listed on amion

## 2022-06-27 NOTE — Progress Notes (Addendum)
Physical Therapy Treatment Patient Details Name: SUNNIE WALKO MRN: [id number] DOB: 11/20/1950 Today's Date: 06/27/2022   History of Present Illness Patient is a 72 year old male with significant hx of lung and bladder CA currently on chemo and radiation treatments, who presented with severe fatigue and confusion, cyanosis and soft BPs. Admitted with sepsis from unspecified organism and PNA R lung. PMH CA, COPD, HLD, lung mass, bladder CA, macular degeneration, hx ileostomy, B knee surgery    PT Comments    Pt using bedrail to pull self into sidelying then pushes up in to sitting EOB, min guard for safety. Pt powers to stand, cues for hand placement, noted to be soiled with BM so returned to sitting. Pt then able to power up to stand 2nd time, maintains static standing with RW and min guard, therapist assisting with pericare. Pt able to take steps over to recliner with RW, cues for RW management and upright posture. Pt tolerates LAQ and seated marching without complaints. Pt reports fatigue, elevated BLE in recliner and positioned to comfort. Pt progressing slowly, difficulty progressing ambulation due to pt having decreased activity tolerance and needing cleaned up at beginning of sessions, also limited by back pain complaints. Pt reports frequent BMs and difficulty feeling when they are coming. Continued education regarding time OOB to improve endurance and tolerance to activity and pt verbalizes agreement.   Recommendations for follow up therapy are one component of a multi-disciplinary discharge planning process, led by the attending physician.  Recommendations may be updated based on patient status, additional functional criteria and insurance authorization.  Follow Up Recommendations  Skilled nursing-short term rehab (<3 hours/day) Can patient physically be transported by private vehicle: No   Assistance Recommended at Discharge Frequent or constant Supervision/Assistance  Patient can  return home with the following A little help with walking and/or transfers;A little help with bathing/dressing/bathroom;Assistance with cooking/housework;Assist for transportation;Help with stairs or ramp for entrance   Equipment Recommendations  None recommended by PT    Recommendations for Other Services       Precautions / Restrictions Precautions Precautions: Fall Precaution Comments: TF Restrictions Weight Bearing Restrictions: No     Mobility  Bed Mobility Overal bed mobility: Needs Assistance Bed Mobility: Supine to Sit Rolling: Supervision Sidelying to sit: Min guard  General bed mobility comments: pt slowly mobilizes BLE to EOB, pulls into sidelying using bedrail then pushes trunk upinto sitting, increased time, no physical assist    Transfers Overall transfer level: Needs assistance Equipment used: Rolling walker (2 wheels) Transfers: Sit to/from Stand, Bed to chair/wheelchair/BSC Sit to Stand: Min guard  Step pivot transfers: Min guard  General transfer comment: min guard to power to stand with RW, cues for hand placement, able to take steps over to recliner with min guard and cues for RW management    Ambulation/Gait      Stairs             Wheelchair Mobility    Modified Rankin (Stroke Patients Only)       Balance Overall balance assessment: Needs assistance Sitting-balance support: No upper extremity supported, Feet supported Sitting balance-[name]: Good Sitting balance - Comments: static sitting EOB   Standing balance support: Reliant on assistive device for balance, During functional activity, Bilateral upper extremity supported Standing balance-[name]: Poor     Cognition Arousal/Alertness: Awake/alert Behavior During Therapy: WFL for tasks assessed/performed Overall Cognitive Status: No family/caregiver present to determine baseline cognitive functioning  General Comments: pt oriented to self  and being in hospital, thinks it  is Sunday and "I have no idea"regarding month, able to follow commands and pleasant        Exercises General Exercises - Lower Extremity Long Arc Quad: Seated, AROM, Strengthening, Both, 10 reps Hip Flexion/Marching: Seated, AROM, Strengthening, Both, 10 reps    General Comments        Pertinent Vitals/Pain Pain Assessment Pain Assessment: Faces Faces Pain Scale: Hurts little more Pain Location: back Pain Descriptors / Indicators: Guarding, Discomfort Pain Intervention(s): Limited activity within patient's tolerance, Monitored during session, Repositioned    Home Living                          Prior Function            PT Goals (current goals can now be found in the care plan section) Acute Rehab PT Goals Patient Stated Goal: be able to work in garden PT Goal Formulation: With patient Time For Goal Achievement: 07/05/22 Potential to Achieve Goals: Fair Progress towards PT goals: Progressing toward goals    Frequency    Min 2X/week      PT Plan Current plan remains appropriate    Co-evaluation              AM-PAC PT "6 Clicks" Mobility   Outcome Measure  Help needed turning from your back to your side while in a flat bed without using bedrails?: A Little Help needed moving from lying on your back to sitting on the side of a flat bed without using bedrails?: A Little Help needed moving to and from a bed to a chair (including a wheelchair)?: A Little Help needed standing up from a chair using your arms (e.g., wheelchair or bedside chair)?: A Little Help needed to walk in hospital room?: A Little Help needed climbing 3-5 steps with a railing? : Total 6 Click Score: 16    End of Session   Activity Tolerance: Patient tolerated treatment well Patient left: in chair;with call bell/phone within reach;with chair alarm set Nurse Communication: Mobility status PT Visit Diagnosis: Unsteadiness on feet (R26.81);Difficulty in walking, not elsewhere  classified (R26.2);Muscle weakness (generalized) (M62.81)     Time: [id number] PT Time Calculation (min) (ACUTE ONLY): 24 min  Charges:  $Therapeutic Exercise: 8-22 mins $Therapeutic Activity: 8-22 mins                      Tori Elic Vencill PT, DPT 06/27/22, 10:54 AM

## 2022-06-27 NOTE — Progress Notes (Addendum)
Palliative-   Chart reviewed- noted plan for patient to get PEG tube for supplemental nutrition. GOC have been clarified for continued aggressive treatments.   Will place order for patient to be followed by Palliative at cancer center.  Palliative will sign off for now. Please feel free to re consult as needed.   Mariana Kaufman, AGNP-C Palliative Medicine  No charge

## 2022-06-28 ENCOUNTER — Ambulatory Visit
Admission: RE | Admit: 2022-06-28 | Discharge: 2022-06-28 | Disposition: A | Discharge status: Home / Self Care | Payer: Medicare Other | Source: Ambulatory Visit | Attending: Radiation Oncology | Admitting: Radiation Oncology

## 2022-06-28 ENCOUNTER — Other Ambulatory Visit: Payer: Self-pay

## 2022-06-28 DIAGNOSIS — F17210 Nicotine dependence, cigarettes, uncomplicated: Secondary | ICD-10-CM | POA: Diagnosis not present

## 2022-06-28 DIAGNOSIS — C771 Secondary and unspecified malignant neoplasm of intrathoracic lymph nodes: Secondary | ICD-10-CM | POA: Diagnosis not present

## 2022-06-28 DIAGNOSIS — Z510 Encounter for antineoplastic radiation therapy: Secondary | ICD-10-CM | POA: Diagnosis not present

## 2022-06-28 DIAGNOSIS — C3412 Malignant neoplasm of upper lobe, left bronchus or lung: Secondary | ICD-10-CM | POA: Diagnosis not present

## 2022-06-28 DIAGNOSIS — R6520 Severe sepsis without septic shock: Secondary | ICD-10-CM | POA: Diagnosis not present

## 2022-06-28 DIAGNOSIS — A419 Sepsis, unspecified organism: Secondary | ICD-10-CM | POA: Diagnosis not present

## 2022-06-28 LAB — RAD ONC ARIA SESSION SUMMARY
Course Elapsed Days: 44
Plan Fractions Treated to Date: 30
Plan Prescribed Dose Per Fraction: 2 Gy
Plan Total Fractions Prescribed: 30
Plan Total Prescribed Dose: 60 Gy
Reference Point Dosage Given to Date: 60 Gy
Reference Point Session Dosage Given: 2 Gy
Session Number: 30

## 2022-06-28 NOTE — Progress Notes (Signed)
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 Lactic acid was 2.6 CK was 764. FOB was positive C. difficile antigen positive but toxin and PCR negative. Chest x-ray revealed right infrahilar airspace opacity.  He was found to have Klebsiella bacteremia and pneumonia and a small acute embolic CVA.  The patient continued to have poor oral intake and tube feeds have been continued.  3/4, EGD revealed radiation esophagitis and a small esophageal stricture which was dilated.  Subjective:  He has no complaints.    Assessment and Plan: Principal Problem:   Severe sepsis due to Klebsiella bacteremia and pneumonia -ID consulted for assistance -He completed 10 days of ceftriaxone on 2/21 - repeat blood cultures negattive  Active Problems: Acute metabolic encephalopathy - Patient remains confused to month year and age.  He is aware he is at [facility] - according to his wife, his confusion started when he came to the hospital and is appears to be improving  New, bibasilar tree-in-bud pulmonary nodularities, suspicious for multifocal pneumonia - noted incidentally on CT from 3/13 - may be residual from his recent infection - holding APAP and following for fevers- no fever - no hypoxia he has had tachypnea since he has come to the hospital  Incidental CVA - Started on Aspirin & Plavix - Cardiology recommended Zio patch upon discharge- since the  patient has been here for 3 weeks, I do not feel that he will require it now - no arrhythmia noted on telemetry  Chronic systolic heart failure - EF 40 to 45% on echo - Continue metoprolol and Losartan - Norvasc held  Radiation esophagitis with esophageal stricture - Continue PPI and sucralfate - he has completed Diflucan - path report reveals exudate and inflamed granulation tissue consistent with an ulcer- no malignancy  Severe malnutrition - Not eating very well- 0-25% of meals - per wife he has not eaten in weeks -Currently receiving Osmolite 85 cc/h, Prosource and mirtazapine - PEG tube planned for 3/18 -Plavix will need to be held for 5 days and aspirin for 24 hours prior to the procedure - Have discussed with neurology that it is okay to hold these medications in setting of a subacute CVA - CT scan completed on 3/13 to ensure he has good anatomy for the PEG  Acute on chronic diarrhea - Questran increased on 3/8  Adenocarcinoma of the lung, recurrent -Neutropenia  - Initial diagnosis was in 2001 - He is status post left upper lobectomy in 12/16/2019 - In 04/11/2022 he had an EBUS and a biopsy from a lymph node which revealed recurrent adenocarcinoma - he is currently receiving Imatinib - last radiation treatment today -Continue resolved after 3 days of filgrastim  History of GIST tumor of the rectum  Normocytic anemia - Hgb 7.8  Deconditioning - PT/OT recommending skilled nursing facility       Code Status: Full Code Consultants: GI,  Palliative, Cardiology, neurology, ID Level of Care: Level of care: Telemetry Total time on patient care: 30 min DVT prophylaxis:  Lovenox, enoxaparin (LOVENOX) injection 40 mg Start: 06/27/22 1800 SCDs Start: 06/06/22 0153   Objective:   Vitals:   06/27/22 2031 06/28/22 0500 06/28/22 0534 06/28/22 1406  BP: 113/63  109/69 111/72  Pulse: (!) 104  (!) 103 91  Resp: '18  18 18  '$ Temp: 98.5 F (36.9 C)  98.5 F (36.9 C) 98.2 F  (36.8 C)  TempSrc: Oral  Oral   SpO2: 100%  99% 98%  Weight:  62.6 kg    Height:       Filed Weights   06/24/22 0500 06/27/22 0714 06/28/22 0500  Weight: 59.1 kg 59.9 kg 62.6 kg   Exam: General exam: Appears comfortable  HEENT: oral mucosa moist, has NG tube Respiratory system: Clear to auscultation.  Cardiovascular system: S1 & S2 heard  Gastrointestinal system: Abdomen soft, non-tender, nondistended. Normal bowel sounds   Extremities: No cyanosis, clubbing or edema Neuro: remains confused to time and age Psychiatry:  Mood & affect appropriate.      CBC: Recent Labs  Lab 06/25/22 0504 06/27/22 0552  WBC 4.2 5.2  NEUTROABS 3.1 3.9  HGB 7.6* 7.8*  HCT 24.2* 25.2*  MCV 93.8 95.1  PLT 389 [id number]    Basic Metabolic Panel: Recent Labs  Lab 06/22/22 0528 06/25/22 0504 06/27/22 0552  NA 138 134* 134*  K 4.2 4.1 4.3  CL 105 103 104  CO2 '23 24 24  '$ GLUCOSE 134* 143* 141*  BUN 37* 32* 27*  CREATININE 0.49* 0.59* 0.45*  CALCIUM 9.1 8.6* 8.5*  MG 1.8 1.8 1.7  PHOS  --  4.3  --     GFR: Estimated Creatinine Clearance: 73.9 mL/min (A) (by C-G formula based on SCr of 0.45 mg/dL (L)).  Scheduled Meds:  aspirin  81 mg Oral Daily   atorvastatin  20 mg Oral Daily   cholestyramine light  4 g Oral QID   enoxaparin (LOVENOX) injection  40 mg Subcutaneous Q24H   feeding supplement  237 mL Oral TID BM   feeding supplement (PROSource TF20)  60 mL Per Tube Daily   lidocaine  15 mL Mouth/Throat TID AC & HS   losartan  50 mg Oral Daily   metoprolol tartrate  25 mg Oral BID   mirtazapine  7.5 mg Oral QHS   nystatin  5 mL Oral QID   mouth rinse  15 mL Mouth Rinse 4 times per day   pantoprazole  40 mg Oral Daily   sucralfate  1 g Oral TID WC & HS   Continuous Infusions:  sodium chloride 10 mL/hr at 06/15/22 2022   feeding supplement (OSMOLITE 1.5 CAL) 1,000 mL (06/27/22 1650)   Imaging and lab data was personally reviewed CT ABDOMEN WO CONTRAST  Result Date:  06/27/2022 CLINICAL DATA:  anatomy eval for possible G tube placement EXAM: CT ABDOMEN WITHOUT CONTRAST TECHNIQUE: Multidetector CT imaging of the abdomen was performed following the standard protocol without IV contrast. RADIATION DOSE REDUCTION: This exam was performed according to the departmental dose-optimization program which includes automated exposure control, adjustment of the mA and/or kV according to patient size and/or use of iterative reconstruction technique. COMPARISON:  CT AP, 04/26/2022 and 03/24/2021 FINDINGS: Lower chest: Calcified coronary atherosclerosis. 1.7 cm peripheral RIGHT basilar pulmonary cyst. Interval development of bibasilar dependent tree-in-bud nodularities. Hepatobiliary: Normal noncontrast appearance of the liver without focal abnormality. No gallstones, gallbladder wall thickening,  or biliary dilatation. Pancreas: No pancreatic ductal dilatation or surrounding inflammatory changes. Spleen: Normal in size without focal abnormality. Adrenals/Urinary Tract: Adrenal glands are unremarkable. Trace bilateral perirenal stranding. Bilateral punctate nonobstructing nephrolithiasis. No hydronephrosis. Stomach/Bowel: Small bore enteric feeding tube, with tip within the antral stomach. Stomach mildly distended and is within normal limits. Mild prominence of air distended small bowel loop medial to the stomach. Distal small bowel anastomosis, incompletely assessed on this evaluation. No bowel obstruction. Vascular/Lymphatic: Moderate-to-severe burden of aortic atherosclerosis without aneurysmal dilatation. No enlarged abdominal lymph nodes. Other: Scaphoid abdomen.  No abdominal wall hernia or abnormality. Musculoskeletal: Multilevel degenerative changes of imaged thoracolumbar spine. No acute osseous findings. IMPRESSION: 1. New, bibasilar tree-in-bud pulmonary nodularities, suspicious for multifocal pneumonia. 2. Anatomy is potentially amenable for percutaneous gastrostomy placement, in  consideration of mildly prominent air distended bowel loop medial to the stomach. Final decision making per performing clinician. Consider preprocedural PO contrast opacification of bowel and colon for better visualization. 3. Gastric placement of enteric feeding tube. 4. Nonobstructing nephrolithiasis and aortic Atherosclerosis (ICD10-I70.0). Additional incidental, chronic and senescent findings as above. Michaelle Birks, MD [facility] Radiology Electronically Signed   By: Michaelle Birks M.D.   On: 06/27/2022 10:28    LOS: 23 days   Author: Debbe Odea  06/28/2022 3:11 PM  To contact [facility]>   Check the care team in [facility] and look for the attending/consulting [place] provider listed  Log into [email or web address] and use [place]'s universal password   Go to> "[facility]"  and find provider  If you still have difficulty reaching the provider, please page the [facility] (Director on Call) for the Hospitalists listed on amion

## 2022-06-28 NOTE — Radiation Completion Notes (Signed)
Patient Name: Jesse [name], Jesse [name] MRN: [id number] Date of Birth: 02/06/1951 Referring Physician: Curt Bears, M.D. Date of Service: 2022-06-28 Radiation Oncologist: Teryl Lucy, M.D. 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 Staging on 2020-05-30: Adenocarcinoma of left lung, stage 2 ([place]) T=cT1a, N=cN1, M=cM0 Staging on 2020-05-30: Adenocarcinoma of right lung, stage 2 (HCC) T=cT1a, N=cN1, M=cM0 Staging on 2022-04-30: Malignant neoplasm of lung (HCC) T=cT0, N=cN2, M=cM0 Intent: Curative     ==========DELIVERED PLANS==========  First Treatment Date: 2022-05-15 - Last Treatment Date: 2022-06-28   Plan Name: Chest Site: Mediastinum Technique: 3D Mode: Photon Dose Per Fraction: 2 Gy Prescribed Dose (Delivered / Prescribed): 60 Gy / 60 Gy Prescribed Fxs (Delivered / Prescribed): 30 / 30     ==========ON TREATMENT VISIT DATES========== 2022-05-15, 2022-05-22, 2022-05-29, 2022-06-05, 2022-06-12, 2022-06-19, 2022-06-26     ==========UPCOMING VISITS==========       ==========APPENDIX - ON TREATMENT VISIT NOTES==========   See weekly On Treatment Notes is Epic for details.

## 2022-06-28 NOTE — Plan of Care (Signed)
  Problem: Clinical Measurements: Goal: Diagnostic test results will improve Outcome: Progressing   Problem: Clinical Measurements: Goal: Signs and symptoms of infection will decrease Outcome: Progressing   Problem: Respiratory: Goal: Ability to maintain adequate ventilation will improve Outcome: Progressing   

## 2022-06-29 DIAGNOSIS — R6520 Severe sepsis without septic shock: Secondary | ICD-10-CM | POA: Diagnosis not present

## 2022-06-29 DIAGNOSIS — A419 Sepsis, unspecified organism: Secondary | ICD-10-CM | POA: Diagnosis not present

## 2022-06-29 LAB — PROTIME-INR
INR: 1 (ref 0.8–1.2)
Prothrombin Time: 12.7 seconds (ref 11.4–15.2)

## 2022-06-29 LAB — MAGNESIUM: Magnesium: 1.9 mg/dL (ref 1.7–2.4)

## 2022-06-29 MED ORDER — PANCRELIPASE (LIP-PROT-AMYL) 10440-39150 UNITS PO TABS
20880.0000 [IU] | ORAL_TABLET | Freq: Once | ORAL | Status: AC
  Administered 2022-06-29: 20880 [IU]
  Filled 2022-06-29: qty 2

## 2022-06-29 MED ORDER — SODIUM BICARBONATE 650 MG PO TABS
650.0000 mg | ORAL_TABLET | Freq: Once | ORAL | Status: AC
  Administered 2022-06-29: 650 mg
  Filled 2022-06-29: qty 1

## 2022-06-29 NOTE — TOC Progression Note (Signed)
Transition of Care [facility]) - Progression Note    Patient Details  Name: Jesse [name] MRN: [id number] Date of Birth: Aug 15, 1950  Transition of Care [facility]) CM/SW [place], RN Phone Number:[phone]  06/29/2022, 4:54 PM  Clinical Narrative:    TOC following for disposition planning. Will update FL2 once PEG is placed. Bed offered are available. Not medically ready to discharge. TOC following.   Expected Discharge Plan: [place] Barriers to Discharge: Continued Medical Work up  Expected Discharge Plan and Services In-house Referral: NA Discharge Planning Services: CM Consult Post Acute Care Choice: [place] Living arrangements for the past 2 months: [place]                 DME Arranged: N/A DME Agency: NA       HH Arranged: NA [place] Agency: NA         Social Determinants of Health (SDOH) Interventions SDOH Screenings   Food Insecurity: No Food Insecurity (06/06/2022)  Housing: Low Risk  (06/06/2022)  Transportation Needs: No Transportation Needs (06/06/2022)  Utilities: Not At Risk (06/06/2022)  Depression (PHQ2-9): Low Risk  (05/07/2022)  Financial Resource Strain: Low Risk  (07/18/2021)  Physical Activity: Sufficiently Active (07/18/2021)  Social Connections: Moderately Isolated (07/12/2020)  Stress: No Stress Concern Present (07/18/2021)  Tobacco Use: Medium Risk (06/21/2022)    Readmission Risk Interventions    06/29/2022    4:48 PM 06/11/2022    5:42 PM  Readmission Risk Prevention Plan  Transportation Screening Complete Complete  PCP or Specialist Appt within 3-5 Days  Complete  HRI or [place]  Complete  Social Work Consult for [place] Planning/Counseling  Complete  Palliative Care Screening  Not Applicable  Medication Review Press photographer) Complete Complete  PCP or Specialist appointment within 3-5 days of discharge Complete   HRI or [place] Complete   SW Recovery  Care/Counseling Consult Complete   Palliative Care Screening Not [place] Complete

## 2022-06-29 NOTE — Progress Notes (Signed)
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 Lactic acid was 2.6 CK was 764. FOB was positive C. difficile antigen positive but toxin and PCR negative. Chest x-ray revealed right infrahilar airspace opacity.  He was found to have Klebsiella bacteremia and pneumonia and a small acute embolic CVA.  The patient continued to have poor oral intake and tube feeds have been continued.  3/4, EGD revealed radiation esophagitis and a small esophageal stricture which was dilated.  Subjective:  Room is cold. Thinks he his having diarrhea. Have checked with the RN and he is not.    Assessment and Plan: Principal Problem:   Severe sepsis due to Klebsiella bacteremia and pneumonia -ID consulted for assistance -He completed 10 days of ceftriaxone on 2/21 - repeat blood cultures negattive  Active Problems: Acute metabolic encephalopathy - Patient remains confused to month year and age.  He is aware he is at [facility] - according to his wife, his confusion started when he came to the hospital and is appears to be improving  New, bibasilar tree-in-bud pulmonary nodularities, suspicious for multifocal pneumonia - noted incidentally on CT from 3/13 - may be residual from his recent infection - holding APAP and following for fevers- no fevers - no hypoxia he has had tachypnea since he has come to the hospital  Incidental CVA - Started on Aspirin & Plavix -  Cardiology recommended Zio patch upon discharge- since the patient has been here for 3 weeks, I do not feel that he will require it now - no arrhythmia noted on telemetry  Chronic systolic heart failure - EF 40 to 45% on echo - BP slightly low - Continue metoprolol and hold Losartan - Norvasc held  Radiation esophagitis with esophageal stricture - Continue PPI and sucralfate - he has completed Diflucan - path report reveals exudate and inflamed granulation tissue consistent with an ulcer- no malignancy  Severe malnutrition - Not eating very well- 0-25% of meals - per wife he has not eaten in weeks -Currently receiving Osmolite 85 cc/h, Prosource and mirtazapine - PEG tube planned for 3/18 -Plavix will need to be held for 5 days and aspirin for 24 hours prior to the procedure - Have discussed with neurology that it is okay to hold these medications in setting of a subacute CVA - CT scan completed on 3/13 to ensure he has good anatomy for the PEG  Acute on chronic diarrhea - Questran increased on 3/8  Adenocarcinoma of the lung, recurrent -Neutropenia  - Initial diagnosis was in 2001 - He is status post left upper lobectomy in 12/16/2019 - In 04/11/2022 he had an EBUS and a biopsy from a lymph node which revealed recurrent adenocarcinoma - he is currently receiving Imatinib - last radiation treatment today -Continue resolved after 3 days of filgrastim  History of GIST tumor of the rectum  Normocytic anemia - Hgb 7.8  Deconditioning -  PT/OT recommending skilled nursing facility       Code Status: Full Code Consultants: GI, Palliative, Cardiology, neurology, ID Level of Care: Level of care: Telemetry Total time on patient care: 30 min DVT prophylaxis:  Lovenox, enoxaparin (LOVENOX) injection 40 mg Start: 06/27/22 1800 SCDs Start: 06/06/22 0153   Objective:   Vitals:   06/28/22 1406 06/28/22 2100 06/29/22 0541 06/29/22 1415  BP: 111/72 127/78 111/71 110/72  Pulse:  91 (!) 109 (!) 104 100  Resp: 18 18 18 18   Temp: 98.2 F (36.8 C) 98.2 F (36.8 C) 98.1 F (36.7 C) 97.7 F (36.5 C)  TempSrc:  Oral Oral Oral  SpO2: 98% 98% 98% 92%  Weight:      Height:       Filed Weights   06/24/22 0500 06/27/22 0714 06/28/22 0500  Weight: 59.1 kg 59.9 kg 62.6 kg   Exam: General exam: Appears comfortable  HEENT: oral mucosa moist, has NG tube Respiratory system: Clear to auscultation.  Cardiovascular system: S1 & S2 heard  Gastrointestinal system: Abdomen soft, non-tender, nondistended. Normal bowel sounds   Extremities: No cyanosis, clubbing or edema Neuro: remains confused to time and age Psychiatry:  Mood & affect appropriate.      CBC: Recent Labs  Lab 06/25/22 0504 06/27/22 0552  WBC 4.2 5.2  NEUTROABS 3.1 3.9  HGB 7.6* 7.8*  HCT 24.2* 25.2*  MCV 93.8 95.1  PLT 389 [id number]    Basic Metabolic Panel: Recent Labs  Lab 06/25/22 0504 06/27/22 0552 06/29/22 0714  NA 134* 134*  --   K 4.1 4.3  --   CL 103 104  --   CO2 24 24  --   GLUCOSE 143* 141*  --   BUN 32* 27*  --   CREATININE 0.59* 0.45*  --   CALCIUM 8.6* 8.5*  --   MG 1.8 1.7 1.9  PHOS 4.3  --   --     GFR: Estimated Creatinine Clearance: 73.9 mL/min (A) (by C-G formula based on SCr of 0.45 mg/dL (L)).  Scheduled Meds:  aspirin  81 mg Oral Daily   atorvastatin  20 mg Oral Daily   cholestyramine light  4 g Oral QID   enoxaparin (LOVENOX) injection  40 mg Subcutaneous Q24H   feeding supplement  237 mL Oral TID BM   feeding supplement (PROSource TF20)  60 mL Per Tube Daily   lidocaine  15 mL Mouth/Throat TID AC & HS   losartan  50 mg Oral Daily   metoprolol tartrate  25 mg Oral BID   mirtazapine  7.5 mg Oral QHS   nystatin  5 mL Oral QID   mouth rinse  15 mL Mouth Rinse 4 times per day   pantoprazole  40 mg Oral Daily   sucralfate  1 g Oral TID WC & HS   Continuous Infusions:  sodium chloride 10 mL/hr at 06/15/22 2022   feeding supplement (OSMOLITE 1.5 CAL) 1,000 mL  (06/29/22 1702)   Imaging and lab data was personally reviewed No results found.  LOS: 24 days   Author: Debbe Odea  06/29/2022 5:08 PM  To contact [facility]>   Check the care team in [facility] and look for the attending/consulting [place] provider listed  Log into [email or web address] and use [place]'s universal password   Go to> "[facility]"  and find provider  If you still have difficulty reaching the provider, please page the [facility] (Director on Call) for the Hospitalists listed on amion

## 2022-06-29 NOTE — Consult Note (Signed)
Chief Complaint: Patient was seen in consultation today for severe malnutrition/evaluation for gastrostomy tube placement  Referring Physician(s): * No referring provider recorded for this case *  Supervising Physician: Markus Daft  Patient Status: [facility] - In-pt  History of Present Illness: Jesse [name] is a 72 y.o. 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 The patient has been admitted [name] on 06/05/22 d/t altered mental status and weakness, and was subsequently found [name] have sepsis secondary [name] right lung pneumonia. Patient has had poor oral intake since admission, with the patient endorsing painful swallowing. GI performed EGD on 06/17/22 which showed radiation esophagitis that is possibly contributing [name] this patient's poor oral intake. NG tube in place and being used for feedings. On 06/11/22, patient also found [name] have acute embolic stroke during admission which necessitates anticoagulation therapy.    Past Medical History:  Diagnosis Date   Anemia    Cancer ([place])    bladder cancer   COPD (chronic obstructive pulmonary disease) ([place])    mild    DIVERTICULOSIS, COLON 01/14/2007   Qualifier: Diagnosis of  By: Paulina Fusi RN, [name]    GASTRITIS, CHRONIC 06/01/2004   Qualifier: Diagnosis of  By: Nolon Rod CMA (AAMA), Robin     GERD 01/09/2007   Qualifier: Diagnosis of  By: Sherren Mocha, RN, Jesse     Heart murmur    as a child; no murmur heard 12/14/19   HYPERLIPIDEMIA 01/09/2007   Qualifier: Diagnosis of  By: Sherren Mocha RN, Dorian Pod     Hypertension    Lung mass    left upper nodule   Macular degeneration    right eye   NEOPLASM, MALIGNANT, BLADDER, HX OF 2002   Qualifier: Diagnosis of  By: Nolon Rod CMA (AAMA), Robin  / no chemo or radiation   OSTEOARTHRITIS 01/14/2007    Qualifier: Diagnosis of  By: Paulina Fusi, RN, [name] - knees   Rectal mass    Reflux esophagitis 06/01/2004   Qualifier: Diagnosis of  By: Nolon Rod CMA (AAMA), Robin     TOBACCO USER 02/16/2009   Qualifier: Diagnosis of  By: Burnice Logan  MD, Doretha Sou    VITAMIN D DEFICIENCY 06/03/2007   Qualifier: Diagnosis of  By: Burnice Logan  MD, Doretha Sou     Past Surgical History:  Procedure Laterality Date   BIOPSY  06/18/2022   Procedure: BIOPSY;  Surgeon: Lavena Bullion, DO;  [facility]: [facility];  Service: Gastroenterology;;   BREAST BIOPSY Left 03/16/2022   [place] LT RADIOACTIVE SEED LOC 03/16/2022 [facility]AMMOGRAP[facility]   BRONCHIAL BIOPSY  10/13/2019   Procedure: BRONCHIAL BIOPSIES;  Surgeon: Garner Nash, DO;  Location: [place] [facility];  Service: Pulmonary;;   BRONCHIAL BRUSHINGS  10/13/2019   Procedure: BRONCHIAL BRUSHINGS;  Surgeon: Garner Nash, DO;  Location: [place];  Service: Pulmonary;;   BRONCHIAL NEEDLE ASPIRATION BIOPSY  10/13/2019   Procedure: BRONCHIAL NEEDLE ASPIRATION BIOPSIES;  Surgeon: Garner Nash, DO;  Location: [place] [facility];  Service: Pulmonary;;   BRONCHIAL NEEDLE ASPIRATION BIOPSY  04/11/2022   Procedure: BRONCHIAL NEEDLE ASPIRATION BIOPSIES;  Surgeon: Garner Nash, DO;  [facility];  Service: Cardiopulmonary;;   BRONCHIAL WASHINGS  10/13/2019   Procedure: BRONCHIAL WASHINGS;  Surgeon: Garner Nash, DO;  Location: [place] [facility];  Service: Pulmonary;;   COLONOSCOPY  multiple   DIVERTING ILEOSTOMY N/A 12/14/2021   Procedure: DIVERTING ILEOSTOMY;  Surgeon: Michael Boston, MD;  [facility];  Service: General;  Laterality: N/A;   ENDOBRONCHIAL ULTRASOUND Bilateral 04/11/2022   Procedure: ENDOBRONCHIAL ULTRASOUND;  Surgeon: Garner Nash, DO;  [facility];  Service: Cardiopulmonary;  Laterality: Bilateral;   ESOPHAGEAL DILATION  06/18/2022   Procedure: ESOPHAGEAL DILATION;  Surgeon: Lavena Bullion, DO;  [facility];   Service: Gastroenterology;;   ESOPHAGOGASTRODUODENOSCOPY (EGD) WITH PROPOFOL N/A 06/18/2022   Procedure: ESOPHAGOGASTRODUODENOSCOPY (EGD) WITH PROPOFOL;  Surgeon: Lavena Bullion, DO;  [facility];  Service: Gastroenterology;  Laterality: N/A;  Severe dysphagia/odynophagia ?radiation esophagitis versus oral candidiasis   ILEOSTOMY CLOSURE N/A 03/22/2022   Procedure: OPEN TAKEDOWN OF LOOP ILEOSTOMY;  Surgeon: Michael Boston, MD;  [facility];  Service: General;  Laterality: N/A;  GEN w/ERAS PATHWAY LOCAL   INTERCOSTAL NERVE BLOCK Left 12/16/2019   Procedure: INTERCOSTAL NERVE BLOCK;  Surgeon: Lajuana Matte, MD;  Location: [place];  Service: Thoracic;  Laterality: Left;   KNEE SURGERY  07/20/2008   bilat.   NASAL SEPTUM SURGERY  1995   NODE DISSECTION Left 12/16/2019   Procedure: NODE DISSECTION;  Surgeon: Lajuana Matte, MD;  Location: [place];  Service: Thoracic;  Laterality: Left;   RADIOACTIVE SEED GUIDED AXILLARY SENTINEL LYMPH NODE Left 03/20/2022   Procedure: RADIOACTIVE SEED GUIDED LEFT AXILLARY SENTINEL LYMPH NODE BIOPSY;  Surgeon: Erroll Luna, MD;  Location: [name];  Service: General;  Laterality: Left;   RECTAL EXAM UNDER ANESTHESIA N/A 03/22/2022   Procedure: ANORECTAL EXAMINATION UNDER ANESTHESIA;  Surgeon: Michael Boston, MD;  [facility];  Service: General;  Laterality: N/A;   TONSILLECTOMY     TRANSURETHRAL RESECTION OF BLADDER TUMOR  2002   UPPER GASTROINTESTINAL ENDOSCOPY     VIDEO BRONCHOSCOPY  04/11/2022   Procedure: VIDEO BRONCHOSCOPY;  Surgeon: Garner Nash, DO;  [facility];  Service: Cardiopulmonary;;   VIDEO BRONCHOSCOPY WITH ENDOBRONCHIAL NAVIGATION N/A 10/13/2019   Procedure: VIDEO BRONCHOSCOPY WITH ENDOBRONCHIAL NAVIGATION;  Surgeon: Garner Nash, DO;  Location: [place];  Service: Pulmonary;  Laterality: N/A;   WISDOM TOOTH EXTRACTION     XI ROBOTIC ASSISTED LOWER ANTERIOR RESECTION N/A 12/14/2021   Procedure: XI  ROBOTIC ASSISTED LOWER ANTERIOR ULTRA LOW RECTOSIGMOID RESECTION, COLOANAL HAND SEWN ANASTOMOSIS, AND BILATERAL TAP BLOCK;  Surgeon: Michael Boston, MD;  [facility];  Service: General;  Laterality: N/A;    Allergies: Patient has no known allergies.  Medications: Prior [name] Admission medications   Medication Sig Start Date End Date Taking? Authorizing Provider  acetaminophen (TYLENOL) 500 MG tablet Take 1,000 mg by mouth every 8 (eight) hours as needed for moderate pain.   Yes [provider]  amLODipine (NORVASC) 5 MG tablet Take 1 tablet (5 mg total) by mouth daily. 10/25/21  Yes [name], MD  atorvastatin (LIPITOR) 40 MG tablet Take 1/2 tablet (20 mg total) by mouth daily. 10/25/21  Yes [name], MD  Cholecalciferol (VITAMIN D3 PO) Take 2,000 Units by mouth daily.   Yes [provider]  cholestyramine Lucrezia Starch) 4 GM/DOSE powder Take 4 g by mouth 2 (two) times daily.   Yes [provider]  diphenoxylate-atropine (LOMOTIL) 2.5-0.025 MG tablet Take 2 tablets by mouth 4 (four) times daily as needed for diarrhea or loose stools.   Yes [provider]  ferrous sulfate 325 (65 FE) MG EC tablet Take 1 tablet (325 mg total)  by mouth 2 (two) times daily. 03/26/22 03/26/23 Yes Michael Boston, MD  imatinib (GLEEVEC) 400 MG tablet Take 1 tablet (400 mg total) by mouth daily. Take with meals and large glass of water.Caution:Chemotherapy. Patient taking differently: Take 400 mg by mouth at bedtime. Take with meals and large glass of water.Caution:Chemotherapy. 03/19/22  Yes Curt Bears, MD  loperamide (IMODIUM A-D) 2 MG tablet Take 4 mg by mouth 4 (four) times daily as needed for diarrhea or loose stools.   Yes [provider]  megestrol (MEGACE ES) 625 MG/5ML suspension TAKE 5 MLS (625 MG TOTAL) BY MOUTH DAILY. 05/23/22  Yes Curt Bears, MD  mirtazapine (REMERON) 15 MG tablet Take 1 tablet (15 mg total) by mouth at bedtime. 05/28/22  Yes  Heilingoetter, Cassandra L, PA-C  Multiple Vitamins-Minerals (PRESERVISION AREDS 2) CAPS Take 1 capsule by mouth 2 (two) times daily.   Yes [provider]  omeprazole (PRILOSEC) 40 MG capsule Take 1 capsule (40 mg total) by mouth daily. 10/25/21  Yes [name], MD  ondansetron (ZOFRAN) 4 MG tablet Take 4 mg by mouth every 8 (eight) hours as needed for nausea or vomiting.   Yes [provider]  OVER THE COUNTER MEDICATION Take 3 tablets by mouth daily.  Balance of [employer]   Yes [provider]  OVER THE COUNTER MEDICATION Take 3 tablets by mouth daily. Balance of [employer]   Yes [provider]  polycarbophil (FIBERCON) 625 MG tablet Take 2,500 mg by mouth daily. Taking 4 tabs ( 625 mg each)  daily   Yes [provider]  prochlorperazine (COMPAZINE) 10 MG tablet Take 1 tablet (10 mg total) by mouth every 6 (six) hours as needed for nausea or vomiting. 04/30/22  Yes Curt Bears, MD  sucralfate (CARAFATE) 1 g tablet Take 1 tablet by mouth 4 times daily -  with meals and at bedtime. 05/28/22  Yes Heilingoetter, Cassandra L, PA-C  potassium chloride SA (KLOR-CON M) 20 MEQ tablet Take 1 tablet (20 mEq total) by mouth daily. 06/04/22   Heilingoetter, Cassandra L, PA-C     Family History  Problem Relation Age of Onset   Dementia Mother    Diabetes Father    Mental illness Father    Peripheral vascular disease Brother     Social History   Socioeconomic History   Marital status: Married    Spouse name: Not on file   Number of children: 3   Years of education: Not on file   Highest education level: Not on file  Occupational History    Comment: semi retired   Tobacco Use   Smoking status: Former    Packs/day: 0.50    Years: 40.00    Additional pack years: 0.00    Total pack years: 20.00    Types: Cigarettes    Quit date: 09/21/2019    Years since quitting: 2.7   Smokeless tobacco: Never  Vaping Use   Vaping Use: Never used   Substance and Sexual Activity   Alcohol use: Yes    Alcohol/week: 15.0 standard drinks of alcohol    Types: 15 Standard drinks or equivalent per week    Comment: wine/beer/martini   Drug use: Not Currently    Types: Marijuana    Comment: Smokes marijuana occasional, last use in 08/2019   Sexual activity: Not Currently  Other Topics Concern   Not on file  Social History Narrative   Married   3 children: 3 grandchildren; 3 great grandchildren  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   Enjoys yard work and [employer] Strain: [place]  (07/18/2021)   Overall Financial Resource Strain (CARDIA)    Difficulty of Paying Living Expenses: Not hard at all  Food Insecurity: No Food Insecurity (06/06/2022)   Hunger Vital Sign    Worried About Running Out of Food in the Last Year: Never true    [place] in the Last Year: Never true  Transportation Needs: No Transportation Needs (06/06/2022)   PRAPARE - Hydrologist (Medical): No    Lack of Transportation (Non-Medical): No  Physical Activity: Sufficiently Active (07/18/2021)   Exercise Vital Sign    Days of Exercise per Week: 7 days    Minutes of Exercise per Session: 60 min  Stress: No Stress Concern Present (07/18/2021)   [place]    Feeling of Stress : Not at all  Social Connections: Moderately Isolated (07/12/2020)   Social Connection and Isolation Panel [NHANES]    Frequency of Communication with Friends and Family: More than three times a week    Frequency of Social Gatherings with Friends and Family: More than three times a week    Attends Religious Services: Never    Marine scientist or Organizations: No    Attends Music therapist: Never    Marital Status: Married    Code Status: Full code  Review of Systems: A 12 point ROS discussed and pertinent positives  are indicated in the HPI above.  All other systems are negative.  Review of Systems  Constitutional:  Negative for chills and fever.  Respiratory:  Negative for chest tightness and shortness of breath.   Cardiovascular:  Negative for chest pain and leg swelling.  Gastrointestinal:  Positive for diarrhea. Negative for abdominal pain, nausea and vomiting.  Neurological:  Negative for dizziness and headaches.  Psychiatric/Behavioral:  Positive for confusion.        Uncertain how reliable ROS is given the patient's encephalopathy    Vital Signs: BP 110/72 (BP Location: Right Arm)   Pulse 100   Temp 97.7 F (36.5 C) (Oral)   Resp 18   Ht 5\' 9"  (1.753 m)   Wt 138 lb 0.1 oz (62.6 kg)   SpO2 92%   BMI 20.38 kg/m    Physical Exam Vitals reviewed.  Constitutional:      General: He is not in acute distress.    Appearance: He is ill-appearing.  HENT:     Nose:     Comments: NG tube in place    Mouth/Throat:     Mouth: Mucous membranes are moist.  Cardiovascular:     Rate and Rhythm: Regular rhythm. Tachycardia present.     Pulses: Normal pulses.     Heart sounds: Normal heart sounds.  Pulmonary:     Effort: Pulmonary effort is normal.     Breath sounds: Normal breath sounds.  Abdominal:     General: Bowel sounds are normal.     Palpations: Abdomen is soft.     Tenderness: There is no abdominal tenderness.  Musculoskeletal:     Right lower leg: No edema.     Left lower leg: No edema.  Skin:    General: Skin is warm and dry.  Neurological:     Mental Status: He is alert. He is disoriented.  Comments: Patient knows his age and location, but unable [name] identify year, month or season of the year, or approximate time of day during exam  Psychiatric:        Mood and Affect: Mood normal.        Behavior: Behavior normal.     Imaging: CT ABDOMEN WO CONTRAST  Result Date: 06/27/2022 CLINICAL DATA:  anatomy eval for possible G tube placement EXAM: CT ABDOMEN WITHOUT CONTRAST  TECHNIQUE: Multidetector CT imaging of the abdomen was performed following the standard protocol without IV contrast. RADIATION DOSE REDUCTION: This exam was performed according [name] the departmental dose-optimization program which includes automated exposure control, adjustment of the mA and/or kV according [name] patient size and/or use of iterative reconstruction technique. COMPARISON:  CT AP, 04/26/2022 and 03/24/2021 FINDINGS: Lower chest: Calcified coronary atherosclerosis. 1.7 cm peripheral RIGHT basilar pulmonary cyst. Interval development of bibasilar dependent tree-in-bud nodularities. Hepatobiliary: Normal noncontrast appearance of the liver without focal abnormality. No gallstones, gallbladder wall thickening, or biliary dilatation. Pancreas: No pancreatic ductal dilatation or surrounding inflammatory changes. Spleen: Normal in size without focal abnormality. Adrenals/Urinary Tract: Adrenal glands are unremarkable. Trace bilateral perirenal stranding. Bilateral punctate nonobstructing nephrolithiasis. No hydronephrosis. Stomach/Bowel: Small bore enteric feeding tube, with tip within the antral stomach. Stomach mildly distended and is within normal limits. Mild prominence of air distended small bowel loop medial [name] the stomach. Distal small bowel anastomosis, incompletely assessed on this evaluation. No bowel obstruction. Vascular/Lymphatic: Moderate-[name]-severe burden of aortic atherosclerosis without aneurysmal dilatation. No enlarged abdominal lymph nodes. Other: Scaphoid abdomen.  No abdominal wall hernia or abnormality. Musculoskeletal: Multilevel degenerative changes of imaged thoracolumbar spine. No acute osseous findings. IMPRESSION: 1. New, bibasilar tree-in-bud pulmonary nodularities, suspicious for multifocal pneumonia. 2. Anatomy is potentially amenable for percutaneous gastrostomy placement, in consideration of mildly prominent air distended bowel loop medial [name] the stomach. Final decision making per  performing clinician. Consider preprocedural PO contrast opacification of bowel and colon for better visualization. 3. Gastric placement of enteric feeding tube. 4. Nonobstructing nephrolithiasis and aortic Atherosclerosis (ICD10-I70.0). Additional incidental, chronic and senescent findings as above. Michaelle Birks, MD [facility] Radiology Electronically Signed   By: Michaelle Birks M.D.   On: 06/27/2022 10:28   DG Abd 1 View  Result Date: 06/20/2022 CLINICAL DATA:  Feeding tube placement EXAM: ABDOMEN - 1 VIEW COMPARISON:  06/18/2022 FINDINGS: The feeding tube tip is in the stomach antrum, oriented towards the pylorus. The lung bases appear clear. Dextroconvex lumbar scoliosis with rotary component. Thoracolumbar spondylosis. IMPRESSION: 1. Feeding tube tip is in the stomach antrum, oriented towards the pylorus. Electronically Signed   By: Van Clines M.D.   On: 06/20/2022 15:30   DG Abd Portable 1V  Result Date: 06/18/2022 CLINICAL DATA:  Encounter for nasogastric tube placement. EXAM: PORTABLE ABDOMEN - 1 VIEW COMPARISON:  06/16/2022 FINDINGS: Single view of the abdomen demonstrates a feeding tube that extends down the esophagus and terminates in the left upper abdomen. The tip of the feeding tube is likely in the gastric fundus or body region. Bowel gas in the left upper quadrant of the abdomen. IMPRESSION: Feeding tube tip is likely in the gastric fundus or body region. Electronically Signed   By: Markus Daft M.D.   On: 06/18/2022 14:40   DG Abd 1 View  Result Date: 06/16/2022 CLINICAL DATA:  Check gastric catheter placement EXAM: ABDOMEN - 1 VIEW COMPARISON:  06/15/2022 FINDINGS: Weighted feeding catheter is noted coiled within the stomach. No free  air is seen. No obstructive changes are noted. IMPRESSION: Weighted feeding catheter in the stomach. Electronically Signed   By: Inez Catalina M.D.   On: 06/16/2022 23:01   DG Abd 1 View  Result Date:  06/15/2022 CLINICAL DATA:  NG tube placement EXAM: ABDOMEN - 1 VIEW COMPARISON:  Abdominal x-ray 06/15/2022 FINDINGS: Nasogastric tube tip is in the proximal stomach just beyond the gastroesophageal junction. No dilated bowel loops are seen. IMPRESSION: Nasogastric tube tip is in the proximal stomach just beyond the gastroesophageal junction. Recommend advancing tube. Electronically Signed   By: Ronney Asters M.D.   On: 06/15/2022 15:07   DG Abd 1 View  Result Date: 06/15/2022 CLINICAL DATA:  NG tube placement. EXAM: ABDOMEN - 1 VIEW COMPARISON:  06/14/2022. FINDINGS: The bowel gas pattern is normal. The distal tip of the feeding tube terminates in the anticipated region of the gastric antrum. Mild airspace disease is noted at the right lung base. No radio-opaque calculi or other significant radiographic abnormality are seen. IMPRESSION: Enteric tube terminates in the anticipated region of the gastric antrum. Electronically Signed   By: Brett Fairy M.D.   On: 06/15/2022 04:41   DG Abd 1 View  Result Date: 06/15/2022 CLINICAL DATA:  Nasogastric tube placement EXAM: ABDOMEN - 1 VIEW COMPARISON:  None Available. FINDINGS: Nasoenteric feeding tube tip overlies the gastric fundus. Nonobstructive bowel gas pattern. No free intraperitoneal gas. Pelvis excluded from view. IMPRESSION: 1. Nasoenteric feeding tube tip overlies the gastric fundus. Electronically Signed   By: Fidela Salisbury M.D.   On: 06/15/2022 00:00   VAS [place] LOWER EXTREMITY VENOUS (DVT)  Result Date: 06/12/2022  Lower Venous DVT Study Patient Name:  Jesse [name]  Date of Exam:   06/12/2022 Medical Rec #: [id number]        Accession #:    [id number] Date of Birth: Aug 14, 1950        Patient Gender: M Patient Age:   29 years Exam [facility]  [facility] Procedure:      VAS [place] LOWER EXTREMITY VENOUS (DVT) Referring Phys: Su Monks --------------------------------------------------------------------------------  Indications: Stroke.  Risk  Factors: None identified. Comparison Study: No prior studies. Performing Technologist: Oliver Hum RVT  Examination Guidelines: A complete evaluation includes B-mode imaging, spectral Doppler, color Doppler, and power Doppler as needed of all accessible portions of each vessel. Bilateral testing is considered an integral part of a complete examination. Limited examinations for reoccurring indications may be performed as noted. The reflux portion of the exam is performed with the patient in reverse Trendelenburg.  +---------+---------------+---------+-----------+----------+--------------+ RIGHT    CompressibilityPhasicitySpontaneityPropertiesThrombus Aging +---------+---------------+---------+-----------+----------+--------------+ CFV      Full           Yes      Yes                                 +---------+---------------+---------+-----------+----------+--------------+ SFJ      Full                                                        +---------+---------------+---------+-----------+----------+--------------+ FV Prox  Full                                                        +---------+---------------+---------+-----------+----------+--------------+  FV Mid   Full                                                        +---------+---------------+---------+-----------+----------+--------------+ FV DistalFull           Yes      Yes                                 +---------+---------------+---------+-----------+----------+--------------+ PFV      Full                                                        +---------+---------------+---------+-----------+----------+--------------+ POP      Full           Yes      Yes                                 +---------+---------------+---------+-----------+----------+--------------+ PTV      Full                                                         +---------+---------------+---------+-----------+----------+--------------+ PERO     Full                                                        +---------+---------------+---------+-----------+----------+--------------+   +---------+---------------+---------+-----------+----------+--------------+ LEFT     CompressibilityPhasicitySpontaneityPropertiesThrombus Aging +---------+---------------+---------+-----------+----------+--------------+ CFV      Full           Yes      Yes                                 +---------+---------------+---------+-----------+----------+--------------+ SFJ      Full                                                        +---------+---------------+---------+-----------+----------+--------------+ FV Prox  Full                                                        +---------+---------------+---------+-----------+----------+--------------+ FV Mid   Full                                                        +---------+---------------+---------+-----------+----------+--------------+  FV DistalFull                                                        +---------+---------------+---------+-----------+----------+--------------+ PFV      Full                                                        +---------+---------------+---------+-----------+----------+--------------+ POP      Full           Yes      Yes                                 +---------+---------------+---------+-----------+----------+--------------+ PTV      Full                                                        +---------+---------------+---------+-----------+----------+--------------+ PERO     Full                                                        +---------+---------------+---------+-----------+----------+--------------+     Summary: RIGHT: - There is no evidence of deep vein thrombosis in the lower extremity.  - No cystic structure found in  the popliteal fossa.  LEFT: - There is no evidence of deep vein thrombosis in the lower extremity.  - No cystic structure found in the popliteal fossa.  *See table(s) above for measurements and observations. Electronically signed by Deitra Mayo MD on 06/12/2022 at 7:34:33 PM.    Final    ECHOCARDIOGRAM COMPLETE  Result Date: 06/12/2022    ECHOCARDIOGRAM REPORT   Patient Name:   Jesse [name] Date of Exam: 06/12/2022 Medical Rec #:  [id number]       Height:       69.0 in Accession #:    [id number]      Weight:       145.9 lb Date of Birth:  [birth year]       BSA:          1.807 m Patient Age:    38 years        BP:           141/96 mmHg Patient Gender: M               HR:           106 bpm. Exam Location:  Inpatient Procedure: 2D Echo, Cardiac Doppler, Color Doppler and Intracardiac            Opacification Agent Indications:    Stroke  History:        Patient has no prior history of Echocardiogram examinations.                 Signs/Symptoms:Bacteremia; Risk Factors:Hypertension and  Dyslipidemia. Lung cancer. Chemo.  Sonographer:    Roseanna Rainbow RDCS Referring Phys: [id number] AMRIT ADHIKARI  Sonographer Comments: Technically difficult study due [name] poor echo windows, Technically challenging study due [name] limited acoustic windows, suboptimal parasternal window, suboptimal apical window and suboptimal subcostal window. Extremely difficult study. IMPRESSIONS  1. Left ventricular ejection fraction, by estimation, is 40 [name] 45%. The left ventricle has mildly decreased function. The left ventricle demonstrates global hypokinesis. The left ventricular internal cavity size was mildly dilated. Indeterminate diastolic filling due [name] E-A fusion.  2. Right ventricular systolic function is normal. The right ventricular size is normal. Tricuspid regurgitation signal is inadequate for assessing PA pressure.  3. The mitral valve is grossly normal. No evidence of mitral valve regurgitation.  4. The aortic valve is  grossly normal. Aortic valve regurgitation is not visualized. No aortic stenosis is present.  5. The inferior vena cava is normal in size with greater than 50% respiratory variability, suggesting right atrial pressure of 3 mmHg. Conclusion(s)/Recommendation(s): No evidence of valvular vegetations on this transthoracic echocardiogram. Consider a transesophageal echocardiogram [name] exclude infective endocarditis if clinically indicated. FINDINGS  Left Ventricle: No left ventricular thrombus is seen (Definity contrast used). Left ventricular ejection fraction, by estimation, is 40 [name] 45%. The left ventricle has mildly decreased function. The left ventricle demonstrates global hypokinesis. Definity contrast agent was given IV [name] delineate the left ventricular endocardial borders. The left ventricular internal cavity size was mildly dilated. There is no left ventricular hypertrophy. Indeterminate diastolic filling due [name] E-A fusion. Right Ventricle: The right ventricular size is normal. No increase in right ventricular wall thickness. Right ventricular systolic function is normal. Tricuspid regurgitation signal is inadequate for assessing PA pressure. Left Atrium: Left atrial size was normal in size. Right Atrium: Right atrial size was normal in size. Pericardium: There is no evidence of pericardial effusion. Mitral Valve: The mitral valve is grossly normal. Mild mitral annular calcification. No evidence of mitral valve regurgitation. Tricuspid Valve: The tricuspid valve is normal in structure. Tricuspid valve regurgitation is not demonstrated. Aortic Valve: The aortic valve is grossly normal. Aortic valve regurgitation is not visualized. No aortic stenosis is present. Pulmonic Valve: The pulmonic valve was not well visualized. Aorta: The aortic root is normal in size and structure. Venous: The inferior vena cava is normal in size with greater than 50% respiratory variability, suggesting right atrial pressure of 3 mmHg.  IAS/Shunts: The interatrial septum was not well visualized.  LEFT VENTRICLE PLAX 2D LVIDd:         5.70 cm      Diastology LVIDs:         4.50 cm      LV e' medial:    5.66 cm/s LV PW:         1.00 cm      LV E/e' medial:  9.5 LV IVS:        1.10 cm      LV e' lateral:   10.00 cm/s LVOT diam:     2.50 cm      LV E/e' lateral: 5.4 LV SV:         54 LV SV Index:   30 LVOT Area:     4.91 cm  LV Volumes (MOD) LV vol d, MOD A2C: 99.6 ml LV vol d, MOD A4C: 122.0 ml LV vol s, MOD A2C: 59.5 ml LV vol s, MOD A4C: 70.9 ml LV SV MOD A2C:     40.1 ml LV SV MOD A4C:  122.0 ml LV SV MOD BP:      46.6 ml RIGHT VENTRICLE             IVC RV S prime:     12.00 cm/s  IVC diam: 1.80 cm TAPSE (M-mode): 2.1 cm LEFT ATRIUM             Index        RIGHT ATRIUM           Index LA diam:        3.10 cm 1.72 cm/m   RA Area:     10.70 cm LA Vol (A2C):   37.0 ml 20.48 ml/m  RA Volume:   21.20 ml  11.73 ml/m LA Vol (A4C):   33.6 ml 18.59 ml/m LA Biplane Vol: 36.7 ml 20.31 ml/m  AORTIC VALVE LVOT Vmax:   83.10 cm/s LVOT Vmean:  54.700 cm/s LVOT VTI:    0.109 m  AORTA Ao Root diam: 3.70 cm MITRAL VALVE MV Area (PHT): 6.32 cm    SHUNTS MV Decel Time: 120 msec    Systemic VTI:  0.11 m MV E velocity: 54.00 cm/s  Systemic Diam: 2.50 cm MV A velocity: 90.80 cm/s MV E/A ratio:  0.59 Mihai Croitoru MD Electronically signed by Sanda Klein MD Signature Date/Time: 06/12/2022/10:54:52 AM    Final    CT ANGIO HEAD NECK W WO CM  Result Date: 06/12/2022 CLINICAL DATA:  Follow-up examination for stroke. EXAM: CT ANGIOGRAPHY HEAD AND NECK TECHNIQUE: Multidetector CT imaging of the head and neck was performed using the standard protocol during bolus administration of intravenous contrast. Multiplanar CT image reconstructions and MIPs were obtained [name] evaluate the vascular anatomy. Carotid stenosis measurements (when applicable) are obtained utilizing NASCET criteria, using the distal internal carotid diameter as the denominator. RADIATION DOSE  REDUCTION: This exam was performed according [name] the departmental dose-optimization program which includes automated exposure control, adjustment of the mA and/or kV according [name] patient size and/or use of iterative reconstruction technique. CONTRAST:  49mL OMNIPAQUE IOHEXOL 350 MG/ML SOLN COMPARISON:  Prior MRI from earlier the same day. FINDINGS: CT HEAD FINDINGS Brain: Age-related cerebral atrophy. Previously identified small ischemic infarcts not visible by CT. No other acute large vessel territory infarct. No acute intracranial hemorrhage. No mass lesion or midline shift. No hydrocephalus or extra-axial fluid collection. Vascular: No abnormal hyperdense vessel. Scattered vascular calcifications noted within the carotid siphons. Skull: Scalp soft tissues and calvarium demonstrate no acute finding. Sinuses/Orbits: Globes orbital soft tissues within normal limits. Paranasal sinuses are clear. No mastoid effusion. Other: None. Review of the MIP images confirms the above findings CTA NECK FINDINGS Aortic arch: Visualized aortic arch within normal limits for caliber with standard branch pattern. Atheromatous change about the origin of the great vessels without hemodynamically significant stenosis. Right carotid system: Right common and internal carotid arteries are patent without dissection. Moderate atheromatous change about the right carotid bulb without hemodynamically significant greater than 50% stenosis. Left carotid system: Left common and internal carotid arteries are patent without dissection. Moderate atheromatous change about the left carotid bulb without hemodynamically significant greater than 50% stenosis. Vertebral arteries: Both vertebral arteries arise from the subclavian arteries. No proximal subclavian artery stenosis. Right vertebral artery dominant. Vertebral arteries patent without dissection or hemodynamically significant stenosis. Skeleton: No worrisome osseous lesions. Moderate degenerative  spondylosis noted at C3-4 through C6-7. Other neck: No other acute soft tissue abnormality within the neck. Nasogastric tube in place. 12 mm left thyroid nodule, of doubtful significance  given size and patient age, no follow-up imaging recommended (ref: J Am Coll Radiol. 2015 Feb;12(2): 143-50). Upper chest: Emphysema noted. Parenchymal opacities within the visualized right upper and lower lobes, suspicious for pneumonia. Layering bilateral pleural effusions, right greater than left. Few small pulmonary nodules measuring up [name] 7 mm at the right lung apex, stable from prior. Review of the MIP images confirms the above findings CTA HEAD FINDINGS Anterior circulation: Both internal carotid arteries are patent [name] the termini without stenosis. Tiny 2 mm outpouching extending laterally from the proximal cavernous left ICA, suspicious for a small aneurysm (series 8, image 109). A1 segments patent bilaterally. Normal anterior communicating artery complex. Both anterior cerebral arteries widely patent without stenosis. No M1 stenosis or occlusion. No proximal MCA branch occlusion or high-grade stenosis. Distal MCA branches perfused and symmetric. Posterior circulation: Both V4 segments are widely patent. Both PICA patent. Basilar patent without stenosis superior cerebellar arteries patent bilaterally. Left PCA supplied via the basilar. Right PCA supplied via a hypoplastic right P1 segment and robust right posterior communicating artery. Both PCAs patent [name] their distal aspects without stenosis. Venous sinuses: Patent allowing for timing the contrast bolus. Anatomic variants: As above. Review of the MIP images confirms the above findings IMPRESSION: CT HEAD IMPRESSION: No acute intracranial abnormality. Previously identified small ischemic infarcts not visible by CT. CTA HEAD AND NECK IMPRESSION: 1. Negative CTA for large vessel occlusion or other emergent finding. 2. Moderate atheromatous change about the carotid  bifurcations without hemodynamically significant greater than 50% stenosis. 3. 2 mm outpouching extending laterally from the proximal cavernous left ICA, suspicious for a small aneurysm. 4. Parenchymal opacities within the partially visualized right lung, suspicious for pneumonia. 5. Layering bilateral pleural effusions, right greater than left. 6. Few small pulmonary nodules measuring up [name] 7 mm at the right lung apex, stable from prior. Continued surveillance recommended. Aortic Atherosclerosis (ICD10-I70.0) and Emphysema (ICD10-J43.9). Electronically Signed   By: Jeannine Boga M.D.   On: 06/12/2022 05:47   MR BRAIN W WO CONTRAST  Result Date: 06/11/2022 CLINICAL DATA:  History of lung cancer, presents with fatigue, confusion, unresponsiveness. EXAM: MRI HEAD WITHOUT AND WITH CONTRAST TECHNIQUE: Multiplanar, multiecho pulse sequences of the brain and surrounding structures were obtained without and with intravenous contrast. CONTRAST:  6.101mL GADAVIST GADOBUTROL 1 MMOL/ML IV SOLN COMPARISON:  CT head 06/07/2022, brain MRI 01/04/2020 FINDINGS: Brain: There are small foci of diffusion restriction in the left frontal cortex (5-37, 5-28) and white matter (5-34), right occipital cortex (5-19), and bilateral cerebellar hemispheres (5-12, 5-10). There is associated FLAIR signal abnormality involving the focus in the left centrum semiovale. There is no associated enhancement. Findings are most in keeping with small acute infarcts, possibly embolic. There is no hemorrhage or mass effect. There is no acute intracranial hemorrhage or extra-axial fluid collection Parenchymal volume is normal. The ventricles are normal in size. Parenchymal signal is otherwise normal, with no significant burden of underlying white matter microangiopathic change. There is no mass lesion or abnormal enhancement. There is no mass effect or midline shift. Vascular: Normal flow voids. Skull and upper cervical spine: Normal marrow signal.  Sinuses/Orbits: The paranasal sinuses are clear. The globes and orbits are unremarkable. Other: There is subcentimeter probable sebaceous cyst in the left cheek. The pituitary and suprasellar region are normal. IMPRESSION: 1. 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 2. No evidence of intracranial metastatic disease. Electronically Signed  By: Valetta Mole M.D.   On: 06/11/2022 12:40   DG Abd 1 View  Result Date: 06/10/2022 CLINICAL DATA:  Nasogastric tube placement EXAM: ABDOMEN - 1 VIEW COMPARISON:  06/07/2022 FINDINGS: Nasoenteric feeding tube tip is seen overlying the expected distal body of the stomach. Normal abdominal gas pattern. No organomegaly. Pelvis excluded from view. Lumbar dextroscoliosis noted with superimposed degenerative change. IMPRESSION: 1. Nasoenteric feeding tube tip within the distal body of the stomach. Electronically Signed   By: Fidela Salisbury M.D.   On: 06/10/2022 22:37   CT HEAD WO CONTRAST (5MM)  Result Date: 06/07/2022 CLINICAL DATA:  Altered mental status.  Brain Mets suspected. EXAM: CT HEAD WITHOUT CONTRAST TECHNIQUE: Contiguous axial images were obtained from the base of the skull through the vertex without intravenous contrast. RADIATION DOSE REDUCTION: This exam was performed according [name] the departmental dose-optimization program which includes automated exposure control, adjustment of the mA and/or kV according [name] patient size and/or use of iterative reconstruction technique. COMPARISON:  MRI Brain 01/12/20 FINDINGS: Brain: No evidence of acute infarction, hemorrhage, hydrocephalus, extra-axial collection or mass lesion/mass effect. Note the lack of IV contrast and CT technique markedly limits the ability [name] identify intracranial metastatic lesions. Vascular: No hyperdense vessel or unexpected calcification. Skull: Normal. Negative for fracture or focal lesion.  Sinuses/Orbits: No middle ear or mastoid effusion. Paranasal sinuses are clear. Orbits are unremarkable. Other: None. IMPRESSION: 1. No acute intracranial abnormality. 2. Note the lack of IV contrast and CT technique markedly limits the ability [name] identify intracranial metastatic lesions. Consider follow-up brain MRI with and without contrast if there is high clinical concern for intracranial metastatic disease. Electronically Signed   By: Marin Roberts M.D.   On: 06/07/2022 18:06   DG Abd 1 View  Result Date: 06/07/2022 CLINICAL DATA:  Check gastric catheter placement EXAM: ABDOMEN - 1 VIEW COMPARISON:  None Available. FINDINGS: Weighted feeding catheter is noted in the distal stomach. Scattered large and small bowel gas is noted. No free air is seen. IMPRESSION: Weighted feeding catheter in the distal stomach. Electronically Signed   By: Inez Catalina M.D.   On: 06/07/2022 17:16   DG Abd 1 View  Result Date: 06/07/2022 CLINICAL DATA:  Nasogastric tube placement. EXAM: ABDOMEN - 1 VIEW COMPARISON:  None Available. FINDINGS: Tip of the weighted enteric tube below the diaphragm in the left upper quadrant. This is in the region of the gastric cardia and directed cranially. There is mild gaseous distension of small and large bowel in the abdomen. IMPRESSION: Tip of the weighted enteric tube in the left upper quadrant in the region of the gastric cardia directed cranially. Electronically Signed   By: Keith Rake M.D.   On: 06/07/2022 13:25   DG Chest Port 1 View  Result Date: 06/07/2022 CLINICAL DATA:  Shortness of breath EXAM: PORTABLE CHEST 1 VIEW COMPARISON:  06/05/2022 FINDINGS: Cardiac shadow is stable. Left lung is clear. Increasing right basilar infiltrate is noted. Some generalized increased density in the right hemithorax is seen which may be related [name] posterior effusion. No bony abnormality is seen. IMPRESSION: Increasing right basilar infiltrate and likely posterior layering effusion.  Electronically Signed   By: Inez Catalina M.D.   On: 06/07/2022 01:56   DG Chest Port 1 View  Result Date: 06/05/2022 CLINICAL DATA:  Per chart: Pt arrived via EMS, weakness and lethargic since saturday, poor PO intake. Lung cancer on the left side, no nausea, diarrhea is typical for him, had radiation today.  EXAM: PORTABLE CHEST - 1 VIEW COMPARISON:  01/29/2020 FINDINGS: Suspect patchy right infrahilar airspace disease. Post partial left pneumonectomy. Heart size and mediastinal contours are within normal limits. No effusion. Right acromioclavicular spurring. IMPRESSION: Patchy right infrahilar airspace disease, new since previous Electronically Signed   By: Lucrezia Europe M.D.   On: 06/05/2022 18:37    Labs:  CBC: Recent Labs    06/18/22 1859 06/20/22 0613 06/25/22 0504 06/27/22 0552  WBC 3.1* 2.6* 4.2 5.2  HGB 7.9* 7.9* 7.6* 7.8*  HCT 23.9* 25.6* 24.2* 25.2*  PLT 247 291 389 397    COAGS: Recent Labs    06/06/22 0035 06/29/22 0714  INR 1.1 1.0  APTT 36  --     BMP: Recent Labs    06/20/22 0613 06/22/22 0528 06/25/22 0504 06/27/22 0552  NA 138 138 134* 134*  K 4.7 4.2 4.1 4.3  CL 106 105 103 104  CO2 25 23 24 24   GLUCOSE 129* 134* 143* 141*  BUN 38* 37* 32* 27*  CALCIUM 8.7* 9.1 8.6* 8.5*  CREATININE 0.57* 0.49* 0.59* 0.45*  GFRNONAA >60 >60 >60 >60    LIVER FUNCTION TESTS: Recent Labs    06/07/22 0324 06/08/22 0537 06/09/22 0325 06/12/22 0330  BILITOT 1.8* 0.7 0.6 0.5  AST 68* 38 27 24  ALT 38 33 28 21  ALKPHOS 64 73 70 104  PROT 4.5* 5.0* 4.7* 5.5*  ALBUMIN 2.4* 2.2* 2.0* 2.1*    TUMOR MARKERS: No results for input(s): "AFPTM", "CEA", "CA199", "CHROMGRNA" in the last 8760 hours.  Assessment and Plan:  Jesse [name] is a 72 yo male with complex PMH being seen in relation [name] request for image-guided percutaneous gastrostomy tube placement. The patient has not been able [name] meet his nutritional needs through oral intake alone. Patient currently receiving  nutrition through NG tube, but there is a need for long-term tube feeding [name] meet nutritional goals. CT abdomen wo contrast from 06/27/22 showed that anatomy is borderline questionable for feasibility of percutaneous gastrostomy tube placement. Case approved by Dr Anselm Pancoast, but with the reservation that pre-procedural imaging on day of procedure (3/18) may reveal that percutaneous placement will not be feasible. Oral contrast ordered [name] be given [name] patient night before procedure. Aspirin [name] be held for only 24 hours prior [name] procedure per Neurology d/t recent CVA. Lovenox [name] be held prior [name] procedure.  Risks and benefits image guided gastrostomy tube placement was discussed with the patient's wife including, but not limited [name] the need for a barium enema during the procedure, bleeding, infection, peritonitis and/or damage [name] adjacent structures.  All of the patient's questions were answered, patient is agreeable [name] proceed.  Consent signed and in IR suite.   Thank you for this interesting consult.  I greatly enjoyed meeting Jesse [name] and look forward [name] participating in their care.  A copy of this report was sent [name] the requesting provider on this date.  Electronically Signed: Lura Em, PA-C 06/29/2022, 3:16 PM   I spent a total of 40 Minutes    in face [name] face in clinical consultation, greater than 50% of which was counseling/coordinating care for severe malnutrition/evaluation for gastrostomy tube placement

## 2022-06-30 ENCOUNTER — Inpatient Hospital Stay (HOSPITAL_COMMUNITY): Payer: Medicare Other

## 2022-06-30 DIAGNOSIS — R6520 Severe sepsis without septic shock: Secondary | ICD-10-CM | POA: Diagnosis not present

## 2022-06-30 DIAGNOSIS — A419 Sepsis, unspecified organism: Secondary | ICD-10-CM | POA: Diagnosis not present

## 2022-06-30 NOTE — Progress Notes (Signed)
Will need to advance tube per xray. It is in the tip of the esophagus. ICU charge RN notified and will be up shortly to help assist with advancement of tube.Roderick Pee

## 2022-06-30 NOTE — Progress Notes (Signed)
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 Lactic acid was 2.6 CK was 764. FOB was positive C. difficile antigen positive but toxin and PCR negative. Chest x-ray revealed right infrahilar airspace opacity.  He was found to have Klebsiella bacteremia and pneumonia and a small acute embolic CVA.  The patient continued to have poor oral intake and tube feeds have been continued.  3/4, EGD revealed radiation esophagitis and a small esophageal stricture which was dilated.  Subjective:  No complaints.  Assessment and Plan: Principal Problem:   Severe sepsis due to Klebsiella bacteremia and pneumonia -ID consulted for assistance -He completed 10 days of ceftriaxone on 2/21 - repeat blood cultures negattive  Active Problems: Acute metabolic encephalopathy - Patient remains confused to month year and age.  He is aware he is at [facility] - according to his wife, his confusion started when he came to the hospital and is appears to be improving  New, bibasilar tree-in-bud pulmonary nodularities, suspicious for multifocal pneumonia - noted incidentally on CT from 3/13 - may be residual from his recent infection - holding APAP and following for fevers- no fevers - no hypoxia - he has had tachypnea since he has come to the hospital and this is unchanged  Incidental CVA - Started on Aspirin & Plavix - Cardiology recommended Zio patch upon discharge-  since the patient has been here for 3 weeks, I do not feel that he will require it now - no arrhythmia noted on telemetry  Chronic systolic heart failure - EF 40 to 45% on echo - BP slightly low - Continue metoprolol and hold Losartan - Norvasc held  Radiation esophagitis with esophageal stricture - Continue PPI and sucralfate - he has completed Diflucan - path report reveals exudate and inflamed granulation tissue consistent with an ulcer- no malignancy  Severe malnutrition - Not eating very well- 0-25% of meals - per wife he has not eaten in weeks -Currently receiving Osmolite 85 cc/h, Prosource and mirtazapine - PEG tube planned for 3/18 -Plavix will need to be held for 5 days and aspirin for 24 hours prior to the procedure - Have discussed with neurology that it is okay to hold these medications in setting of a subacute CVA - CT scan completed on 3/13 to ensure he has good anatomy for the PEG  Acute on chronic diarrhea - Questran increased on 3/8  Adenocarcinoma of the lung, recurrent -Neutropenia  - Initial diagnosis was in 2001 - He is status post left upper lobectomy in 12/16/2019 - In 04/11/2022 he had an EBUS and a biopsy from a lymph node which revealed recurrent adenocarcinoma - he is currently receiving Imatinib - last radiation treatment today -Continue resolved after 3 days of filgrastim  History of GIST tumor of the rectum  Normocytic anemia - Hgb 7.8  Deconditioning - PT/OT recommending skilled nursing facility  Code Status: Full Code Consultants: GI, Palliative, Cardiology, neurology, ID Level of Care: Level of care: Telemetry Total time on patient care: 15 min DVT prophylaxis:  Lovenox, enoxaparin (LOVENOX) injection 40 mg Start: 06/27/22 1800 SCDs Start: 06/06/22 0153   Objective:   Vitals:   06/29/22 0541 06/29/22 1415 06/29/22 2011 06/30/22 0624  BP: 111/71 110/72 109/72 128/75  Pulse: (!) 104 100 (!) 110 (!) 107  Resp: 18 18 18 18    Temp: 98.1 F (36.7 C) 97.7 F (36.5 C) 98.1 F (36.7 C) 98 F (36.7 C)  TempSrc: Oral Oral Oral Oral  SpO2: 98% 92% 98% 100%  Weight:      Height:       Filed Weights   06/24/22 0500 06/27/22 0714 06/28/22 0500  Weight: 59.1 kg 59.9 kg 62.6 kg   Exam: General exam: Appears comfortable  HEENT: oral mucosa moist- NG tube present Respiratory system: Clear to auscultation.  Cardiovascular system: S1 & S2 heard  Gastrointestinal system: Abdomen soft, non-tender, nondistended. Normal bowel sounds   Extremities: No cyanosis, clubbing or edema Psychiatry:  Mood & affect appropriate.    CBC: Recent Labs  Lab 06/25/22 0504 06/27/22 0552  WBC 4.2 5.2  NEUTROABS 3.1 3.9  HGB 7.6* 7.8*  HCT 24.2* 25.2*  MCV 93.8 95.1  PLT 389 [id number]    Basic Metabolic Panel: Recent Labs  Lab 06/25/22 0504 06/27/22 0552 06/29/22 0714  NA 134* 134*  --   K 4.1 4.3  --   CL 103 104  --   CO2 24 24  --   GLUCOSE 143* 141*  --   BUN 32* 27*  --   CREATININE 0.59* 0.45*  --   CALCIUM 8.6* 8.5*  --   MG 1.8 1.7 1.9  PHOS 4.3  --   --     GFR: Estimated Creatinine Clearance: 73.9 mL/min (A) (by C-G formula based on SCr of 0.45 mg/dL (L)).  Scheduled Meds:  aspirin  81 mg Oral Daily   atorvastatin  20 mg Oral Daily   cholestyramine light  4 g Oral QID   enoxaparin (LOVENOX) injection  40 mg Subcutaneous Q24H   feeding supplement  237 mL Oral TID BM   feeding supplement (PROSource TF20)  60 mL Per Tube Daily   lidocaine  15 mL Mouth/Throat TID AC & HS   metoprolol tartrate  25 mg Oral BID   mirtazapine  7.5 mg Oral QHS   mouth rinse  15 mL Mouth Rinse 4 times per day   pantoprazole  40 mg Oral Daily   sucralfate  1 g Oral TID WC & HS   Continuous Infusions:  sodium chloride 10 mL/hr at 06/15/22 2022   feeding supplement (OSMOLITE 1.5 CAL) 1,000 mL (06/30/22 1639)   Imaging and lab data was personally reviewed No results found.  LOS: 25 days   Author: Debbe Odea  06/30/2022  5:02 PM  To contact [facility]>   Check the care team in [facility] and look for the attending/consulting [facility] provider listed  Log into [email or web address] and use [place]'s universal password   Go to> "[facility]"  and find provider  If you still have difficulty reaching the provider, please page the [facility] (Director on Call) for the Hospitalists listed on amion

## 2022-06-30 NOTE — Progress Notes (Signed)
This RN went to assess patient, flush tube and administer medication. Upon assessing tube, I noticed that it was measuring 35 cm. The placed measurement was 70 cm. Tube feeding was stopped and MD notified. Waiting for KUB at this time. Jesse [name]

## 2022-07-01 ENCOUNTER — Inpatient Hospital Stay (HOSPITAL_COMMUNITY): Payer: Medicare Other

## 2022-07-01 DIAGNOSIS — R6520 Severe sepsis without septic shock: Secondary | ICD-10-CM | POA: Diagnosis not present

## 2022-07-01 DIAGNOSIS — A419 Sepsis, unspecified organism: Secondary | ICD-10-CM | POA: Diagnosis not present

## 2022-07-01 LAB — CBC
HCT: 25.6 % — ABNORMAL LOW (ref 39.0–52.0)
Hemoglobin: 7.9 g/dL — ABNORMAL LOW (ref 13.0–17.0)
MCH: 28.9 pg (ref 26.0–34.0)
MCHC: 30.9 g/dL (ref 30.0–36.0)
MCV: 93.8 fL (ref 80.0–100.0)
Platelets: 352 10*3/uL (ref 150–400)
RBC: 2.73 MIL/uL — ABNORMAL LOW (ref 4.22–5.81)
RDW: 15.3 % (ref 11.5–15.5)
WBC: 6.6 10*3/uL (ref 4.0–10.5)
nRBC: 0 % (ref 0.0–0.2)

## 2022-07-01 LAB — BASIC METABOLIC PANEL
Anion gap: 11 (ref 5–15)
BUN: 32 mg/dL — ABNORMAL HIGH (ref 8–23)
CO2: 25 mmol/L (ref 22–32)
Calcium: 8.8 mg/dL — ABNORMAL LOW (ref 8.9–10.3)
Chloride: 102 mmol/L (ref 98–111)
Creatinine, Ser: 0.53 mg/dL — ABNORMAL LOW (ref 0.61–1.24)
GFR, Estimated: >60 mL/min (ref >60)
Glucose, Bld: 103 mg/dL — ABNORMAL HIGH (ref 70–99)
Potassium: 4.3 mmol/L (ref 3.5–5.1)
Sodium: 138 mmol/L (ref 135–145)

## 2022-07-01 LAB — GLUCOSE, CAPILLARY: Glucose-Capillary: 101 mg/dL — ABNORMAL HIGH (ref 70–99)

## 2022-07-01 NOTE — Progress Notes (Signed)
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 Lactic acid was 2.6 CK was 764. FOB was positive C. difficile antigen positive but toxin and PCR negative. Chest x-ray revealed right infrahilar airspace opacity.  He was found to have Klebsiella bacteremia and pneumonia and a small acute embolic CVA.  The patient continued to have poor oral intake and tube feeds have been continued.  3/4, EGD revealed radiation esophagitis and a small esophageal stricture which was dilated.  Subjective:  The patient was seen and examined this morning, minimal oral intake, NG tube in place  She is aware that he is scheduled for PEG tube placement for the morning 07/01/2022 No major issues overnight  Assessment and Plan: Principal Problem:   Severe sepsis due to Klebsiella bacteremia and pneumonia -ID consulted for assistance -He completed 10 days of ceftriaxone on 2/21 - repeat blood cultures negattive -Resolved sepsis physiology BP 115/79, pulse (!) 107, temperature 98 F (36.7 C), tRR 18, weight 59.9 kg, SpO2 96 %  on RA    Active Problems:  Acute metabolic encephalopathy -Much improved-awake alert today following commands  - according to his wife, his confusion started when he came to the hospital and is appears to be improving  New, bibasilar tree-in-bud pulmonary nodularities, suspicious for multifocal pneumonia - noted incidentally on CT from 3/13 - may be  residual from his recent infection - holding APAP and following for fevers- no fevers - no hypoxia - he has had tachypnea since he has come to the hospital and this is unchanged  Incidental CVA - Started on Aspirin & Plavix - Cardiology recommended Zio patch upon discharge- since the patient has been here for 3 weeks, I do not feel that he will require it now - no arrhythmia noted on telemetry  Chronic systolic heart failure - EF 40 to 45% on echo - BP slightly low - Continue metoprolol and hold Losartan - Norvasc held  Radiation esophagitis with esophageal stricture - Continue PPI and sucralfate - he has completed Diflucan - path report reveals exudate and inflamed granulation tissue consistent with an ulcer- no malignancy  Severe malnutrition - Not eating very well- 0-25% of meals - per wife he has not eaten in weeks -NG tube has been placed - currently receiving Osmolite 85 cc/h, Prosource and mirtazapine - PEG tube planned for 07/02/2022 -Plavix will need to be held for 5 days and aspirin for 24 hours prior to the procedure - Have discussed with neurology that it is okay to hold these medications in setting of a subacute CVA - CT scan completed on 3/13 to ensure he has good anatomy for the PEG  Acute on chronic diarrhea - Questran increased on 3/8  Adenocarcinoma of the lung, recurrent -Neutropenia  - Initial diagnosis was in 2001 - He is status post left upper lobectomy in 12/16/2019 - In 04/11/2022 he had an EBUS and a biopsy  from a lymph node which revealed recurrent adenocarcinoma - he is currently receiving Imatinib - last radiation treatment today -Continue resolved after 3 days of filgrastim  History of GIST tumor of the rectum  Normocytic anemia - Hgb 7.8  Deconditioning - PT/OT recommending skilled nursing facility    Code Status: Full Code Consultants: GI, Palliative, Cardiology, neurology, ID Level of Care: Level of care: Telemetry Total time on patient  care: 15 min DVT prophylaxis:  Lovenox, enoxaparin (LOVENOX) injection 40 mg Start: 06/27/22 1800 SCDs Start: 06/06/22 0153   Objective:   Vitals:   06/30/22 0624 06/30/22 2017 07/01/22 0619 07/01/22 0724  BP: 128/75 120/77 115/79   Pulse: (!) 107 (!) 110 (!) 107   Resp: 18 18 18    Temp: 98 F (36.7 C) 98.1 F (36.7 C) 98 F (36.7 C)   TempSrc: Oral Oral Oral   SpO2: 100% 99% 96%   Weight:    59.9 kg  Height:       Filed Weights   06/27/22 0714 06/28/22 0500 07/01/22 0724  Weight: 59.9 kg 62.6 kg 59.9 kg   Exam: General:  AAO x 3,  cooperative, no distress;   HEENT:  NG tube in place  normocephalic, PERRL, otherwise with in Normal limits   Neuro:  CNII-XII intact. , normal motor and sensation, reflexes intact   Lungs:   Clear to auscultation BL, Respirations unlabored,  No wheezes / crackles  Cardio:    S1/S2, RRR, No murmure, No Rubs or Gallops   Abdomen:  Soft, non-tender, bowel sounds active all four quadrants, no guarding or peritoneal signs.  Muscular  skeletal:  Limited exam -sever global generalized weaknesses - in bed, able to move all 4 extremities,   2+ pulses,  symmetric, No pitting edema  Skin:  Dry, warm to touch, negative for any Rashes,  Wounds: Please see nursing documentation         CBC: Recent Labs  Lab 06/25/22 0504 06/27/22 0552 07/01/22 0542  WBC 4.2 5.2 6.6  NEUTROABS 3.1 3.9  --   HGB 7.6* 7.8* 7.9*  HCT 24.2* 25.2* 25.6*  MCV 93.8 95.1 93.8  PLT 389 397 [id number]   Basic Metabolic Panel: Recent Labs  Lab 06/25/22 0504 06/27/22 0552 06/29/22 0714 07/01/22 0542  NA 134* 134*  --  138  K 4.1 4.3  --  4.3  CL 103 104  --  102  CO2 24 24  --  25  GLUCOSE 143* 141*  --  103*  BUN 32* 27*  --  32*  CREATININE 0.59* 0.45*  --  0.53*  CALCIUM 8.6* 8.5*  --  8.8*  MG 1.8 1.7 1.9  --   PHOS 4.3  --   --   --    GFR: Estimated Creatinine Clearance: 70.7 mL/min (A) (by C-G formula based on SCr of 0.53 mg/dL (L)).  Scheduled Meds:   aspirin  81 mg Oral Daily   atorvastatin  20 mg Oral Daily   cholestyramine light  4 g Oral QID   enoxaparin (LOVENOX) injection  40 mg Subcutaneous Q24H   feeding supplement  237 mL Oral TID BM   feeding supplement (PROSource TF20)  60 mL Per Tube Daily   lidocaine  15 mL Mouth/Throat TID AC & HS   metoprolol tartrate  25 mg Oral BID   mirtazapine  7.5 mg Oral QHS   mouth rinse  15 mL Mouth Rinse 4 times per day   pantoprazole  40 mg  Oral Daily   sucralfate  1 g Oral TID WC & HS   Continuous Infusions:  sodium chloride 10 mL/hr at 06/15/22 2022   feeding supplement (OSMOLITE 1.5 CAL) 85 mL/hr at 07/01/22 0700   Imaging and lab data was personally reviewed DG Abd 1 View  Result Date: 07/01/2022 CLINICAL DATA:  Feeding tube placement. EXAM: ABDOMEN - 1 VIEW COMPARISON:  06/30/2022. FINDINGS: The bowel gas pattern is normal. A feeding tube terminates in the gastric fundus. No radio-opaque calculi or other acute radiographic abnormality are seen. IMPRESSION: Feeding tube terminates in the gastric fundus. Electronically Signed   By: Brett Fairy M.D.   On: 07/01/2022 01:00   DG Abd 1 View  Result Date: 06/30/2022 CLINICAL DATA:  Feeding tube placement EXAM: ABDOMEN - 1 VIEW COMPARISON:  06/20/2022 FINDINGS: Feeding tube has been withdrawn, weighted tip overlies the upper esophagus on 1 of the views. Nonobstructed gas pattern. IMPRESSION: Feeding tube tip overlies the upper esophagus on 1 of the views. These results will be called to the ordering clinician or representative by the Radiologist Assistant, and communication documented in the PACS or [employer]. Electronically Signed   By: Donavan Foil M.D.   On: 06/30/2022 23:22    LOS: 26 days   Author: Erling Conte A Stephonie Wilcoxen  07/01/2022 9:52 AM  To contact [facility]>   Check the care team in [facility] and look for the attending/consulting [place] provider listed  Log into [email or web address] and use [place]'s universal password   Go to>  "[facility]"  and find provider  If you still have difficulty reaching the provider, please page the [facility] (Director on

## 2022-07-01 NOTE — Progress Notes (Signed)
Mobility Specialist - Progress Note   07/01/22 1325  Mobility  Activity Ambulated with assistance in room;Stood at bedside  Level of Assistance Standby assist, set-up cues, supervision of patient - no hands on  Assistive Device Front wheel walker  Distance Ambulated (ft) 5 ft  Activity Response Tolerated well  Mobility Referral Yes  $Mobility charge 1 Mobility   Pt received in bed and agreeable to do sit-to-stands. Once standing pt had a BM. Assisted pt w/ cleaning up. Pt MinA from sit>stand. Pt stood long enough to be able to make his bed. Pt was able to take a few steps to the door & back to bed. Assisted pt w/ brushing teeth as well as washing face. C/o feeling weaker overtime. No other complaints during session. Pt to bed after session with all needs met & wife in room.    [facility]

## 2022-07-01 NOTE — Progress Notes (Signed)
Pt given Omnipaque contrast through NG tube  at 1600 per order . Tolerated it well .

## 2022-07-02 ENCOUNTER — Inpatient Hospital Stay (HOSPITAL_COMMUNITY): Payer: Medicare Other

## 2022-07-02 DIAGNOSIS — A419 Sepsis, unspecified organism: Secondary | ICD-10-CM | POA: Diagnosis not present

## 2022-07-02 DIAGNOSIS — I639 Cerebral infarction, unspecified: Secondary | ICD-10-CM | POA: Insufficient documentation

## 2022-07-02 DIAGNOSIS — R6520 Severe sepsis without septic shock: Secondary | ICD-10-CM | POA: Diagnosis not present

## 2022-07-02 HISTORY — PX: IR FLUORO RM 30-60 MIN: IMG2384

## 2022-07-02 LAB — MAGNESIUM: Magnesium: 2 mg/dL (ref 1.7–2.4)

## 2022-07-02 MED ORDER — LOPERAMIDE HCL 2 MG PO CAPS
2.0000 mg | ORAL_CAPSULE | ORAL | Status: DC | PRN
  Administered 2022-07-03 – 2022-07-04 (×2): 2 mg via ORAL
  Filled 2022-07-02 (×2): qty 1

## 2022-07-02 MED ORDER — MELATONIN 5 MG PO TABS
5.0000 mg | ORAL_TABLET | Freq: Every evening | ORAL | Status: AC | PRN
  Administered 2022-07-02 – 2022-07-03 (×2): 5 mg via ORAL
  Filled 2022-07-02 (×2): qty 1

## 2022-07-02 NOTE — Assessment & Plan Note (Signed)
Resolved

## 2022-07-02 NOTE — Assessment & Plan Note (Signed)
-   Continue Questran, Imodium

## 2022-07-02 NOTE — Progress Notes (Signed)
  Progress Note   Patient: Jesse [name] [id number] DOB: December 14, 1950 DOA: 06/05/2022     27 DOS: the patient was seen and examined on 07/02/2022 at 10:23AM      Brief hospital course: Mr. Arwood is a 72 y.o. 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   Found to have pneumonia, Klebsiella bacteremia and small embolic stroke   Hospitalization complicated by new reduced EF and also poor oral intake manage/radiation esophagitis requiring tube feeding, GI following.         Assessment and Plan: * Severe sepsis due to Klebsiella pneumonia and bacteremia Completed treatment  Acute metabolic encephalopathy Resolved  Esophageal dysphagia - Continue sucralfate - Continue PPI - Pursue PEG tube  Acute ischemic stroke, likely embolic - Continue aspirin and Lipitor  Esophageal stricture S/p EGD and dilation  Protein-calorie malnutrition, severe - PEG planned - Continue tube feeds overnight - Continue mirtazapin  Pancytopenia due to chemotherapy Anemia Hgb remains low 8s/7s.    Stable, no clincal bleeding. Platelets and WBC normalized.  Acute on chronic diarrhea - Continue Questran - Start Imodium  Acute respiratory failure with hypoxia (HCC) Resolved.  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  Hungry.  No confusion.     Physical Exam: BP 107/81 (BP Location: Right Arm)   Pulse (!) 105   Temp 97.7 F (36.5 C) (Oral)   Resp 20   Ht 5\' 9"  (1.753 m)   Wt  58.5 kg   SpO2 97%   BMI 19.05 kg/m   Very thin adult male, lying in bed, nasogastric tube in place, appears weak Tachycardic, regular, no murmurs, no peripheral edema Respiratory rate normal, lungs clear without rales or wheezes Abdomen soft without tenderness palpation or guarding, no ascites or distention Attention normal, affect normal, judgment insight appear normal, severe generalized weakness    Data Reviewed: Discussed with general surgery and interventional radiology CBC shows hemoglobin 7.9, stable Basic metabolic panel unremarkable  Family Communication: Wife by phone    Disposition: Status is: Inpatient Barrier to discharge is oral intake, once PEG is placd, can discharge to SNF        Author: Edwin Dada, MD 07/02/2022 5:02 PM  For on call review [email or web address].

## 2022-07-02 NOTE — Assessment & Plan Note (Signed)
-   Hold Gleevec

## 2022-07-02 NOTE — Assessment & Plan Note (Signed)
GI were consulted due to dysphagia and persistently poor oral intake.  He underwent EGD that showed radiation esophagitis, and the overall assessment was that his dysphagia was from a combination of mostly radiaiton injury, but compounded by Schatzki ring, oral candidaisis, and delirium.  SLP and dietitian were consulted, and patient underwent close monitoring, but even despite treatment (and resolution of candidiasis), dilation of stricture, and resolution of delirium, his oral intake appaers to be less than 50% of estimated needs.  Given his already severe weight loss, PEG tube was recommended.  PEG placed 3/20 by GI - Appreciate GI and Surgery expertise - Continue sucralfate - Continue PPI, mirtazapine - Continue diet advancement as able - TFs at 30cc/hr today, ramp up tomorrow

## 2022-07-02 NOTE — Progress Notes (Signed)
Patient ID: Jesse [name], male   DOB: 1950-12-04, 72 y.o.   MRN: [id number]  Patient was evaluated in IR for gastrostomy tube placement.  There is a not a safe percutaneous window to place a gastrostomy due to gas-filled and distended loops of small bowel.  Consider a surgical consultation for gastrostomy tube placement.

## 2022-07-02 NOTE — Assessment & Plan Note (Signed)
Continue atorvastatin

## 2022-07-02 NOTE — Assessment & Plan Note (Signed)
Anemia Stable, no clinical bleeding. Platelets and WBC normalized.

## 2022-07-02 NOTE — Assessment & Plan Note (Signed)
Oncology consulted earlier in hospitalization

## 2022-07-02 NOTE — Progress Notes (Signed)
Occupational Therapy Treatment Patient Details Name: ALEJOS ARLINE MRN: [id number] DOB: Jul 09, 1950 Today's Date: 07/02/2022   History of present illness Patient is a 72 year old male with significant hx of lung and bladder CA currently on chemo and radiation treatments, who presented with severe fatigue and confusion, cyanosis and soft BPs. Admitted with sepsis from unspecified organism and PNA R lung. PMH CA, COPD, HLD, lung mass, bladder CA, macular degeneration, hx ileostomy, B knee surgery   OT comments  Patient was noted to make progress towards goals. Patient was able to engage in toileting hygiene with less assistance than previous session with increased hygiene needed with consistent loose (liquid) BM during session. Nurse made aware. Patient's HR max 129bpm during session. Nurse made aware. Patient reported he is eager to get home to his garden. Patient's discharge plan remains appropriate at this time. OT will continue to follow acutely.     Recommendations for follow up therapy are one component of a multi-disciplinary discharge planning process, led by the attending physician.  Recommendations may be updated based on patient status, additional functional criteria and insurance authorization.    Follow Up Recommendations  Skilled nursing-short term rehab (<3 hours/day)     Assistance Recommended at Discharge Frequent or constant Supervision/Assistance  Patient can return home with the following  A lot of help with walking and/or transfers;A lot of help with bathing/dressing/bathroom;Assistance with cooking/housework;Direct supervision/assist for medications management;Assist for transportation;Help with stairs or ramp for entrance   Equipment Recommendations  BSC/3in1       Precautions / Restrictions Precautions Precautions: Fall Precaution Comments: monitor HR, NG tube feeding Restrictions Weight Bearing Restrictions: No       Mobility Bed Mobility Overal bed  mobility: Needs Assistance Bed Mobility: Supine to Sit Rolling: Supervision Sidelying to sit: Min guard       General bed mobility comments: pt slowly mobilizes BLE to EOB, pulls into sidelying using bedrail then pushes trunk upinto sitting, increased time, no physical assist         Balance Overall balance assessment: Needs assistance Sitting-balance support: No upper extremity supported, Feet supported Sitting balance-[name]: Good     Standing balance support: During functional activity, Single extremity supported Standing balance-[name]: Fair         ADL either performed or assessed with clinical judgement   ADL Overall ADL's : Needs assistance/impaired     Grooming: Standing;Min guard;Wash/dry face;Wash/dry hands;Oral care Grooming Details (indicate cue type and reason): with noted to have HR increased to 120s with standing tasks                     Toileting- Clothing Manipulation and Hygiene: Minimal assistance;Sit to/from stand Toileting - Clothing Manipulation Details (indicate cue type and reason): found to be incontinent of BM and required  assist for perianal care and management of clothing. Hospital gown had to be changed. Once again patient unaware. patient noted to have consistent liquid BM during session in standing. nurse made aware.              Cognition Arousal/Alertness: Awake/alert Behavior During Therapy: WFL for tasks assessed/performed Overall Cognitive Status: No family/caregiver present to determine baseline cognitive functioning       General Comments: patient was plesnat and cooperative during session                   Pertinent Vitals/ Pain       Pain Assessment Pain Assessment: Faces Faces Pain Scale:  Hurts a little bit Pain Location: back Pain Descriptors / Indicators: Guarding, Discomfort Pain Intervention(s): Limited activity within patient's tolerance, Monitored during session         Frequency   Min 2X/week        Progress Toward Goals  OT Goals(current goals can now be found in the care plan section)  Progress towards OT goals: Progressing toward goals     Plan Discharge plan remains appropriate       AM-PAC OT "6 Clicks" Daily Activity     Outcome Measure   Help from another person eating meals?: None Help from another person taking care of personal grooming?: A Little Help from another person toileting, which includes using toliet, bedpan, or urinal?: A Lot Help from another person bathing (including washing, rinsing, drying)?: A Lot Help from another person to put on and taking off regular upper body clothing?: A Little Help from another person to put on and taking off regular lower body clothing?: A Lot 6 Click Score: 16    End of Session Equipment Utilized During Treatment: Rolling walker (2 wheels)  OT Visit Diagnosis: Other abnormalities of gait and mobility (R26.89);Unsteadiness on feet (R26.81);Muscle weakness (generalized) (M62.81);Other symptoms and signs involving cognitive function;Pain   Activity Tolerance Patient limited by fatigue;Other (comment) (HR in 120s nurse aware)   Patient Left with call bell/phone within reach;in chair;with chair alarm set   Nurse Communication Mobility status;Other (comment) (HR during session, and loosness of BM)        [id number] OT Time Calculation (min): 26 min  Charges: OT General Charges $OT Visit: 1 Visit OT Treatments $Self Care/Home Management : 23-37 mins  Rennie Plowman, MS Acute Rehabilitation Department Office# [phone]   Willa Rough 07/02/2022, 9:41 AM

## 2022-07-02 NOTE — Assessment & Plan Note (Signed)
-   Consult Oncology °

## 2022-07-02 NOTE — Assessment & Plan Note (Signed)
-   Continue aspirin and Lipitor - Hold Plavix today, may resume tomorrow

## 2022-07-02 NOTE — Assessment & Plan Note (Signed)
Completed treatment.

## 2022-07-02 NOTE — Assessment & Plan Note (Signed)
BP controlled ?-Continue metoprolol ?

## 2022-07-02 NOTE — Assessment & Plan Note (Signed)
See above

## 2022-07-02 NOTE — Assessment & Plan Note (Signed)
S/p EGD and dilation

## 2022-07-02 NOTE — Progress Notes (Signed)
PT Cancellation Note  Patient Details Name: Jesse [name] MRN: [id number] DOB: 1950-06-25   Cancelled Treatment:    Reason Eval/Treat Not Completed: Other (comment) Pt just worked with OT, had loose BM, and to go for PEG later today.  Additionally, will hold PT today in order to spread out PT/OT services to maximize therapy participation. Abran Richard, PT Acute Rehab [facility] [phone]   Karlton Lemon 07/02/2022, 11:47 AM

## 2022-07-03 ENCOUNTER — Encounter (HOSPITAL_COMMUNITY): Payer: Self-pay | Admitting: Internal Medicine

## 2022-07-03 DIAGNOSIS — E43 Unspecified severe protein-calorie malnutrition: Secondary | ICD-10-CM

## 2022-07-03 LAB — CBC
HCT: 26 % — ABNORMAL LOW (ref 39.0–52.0)
Hemoglobin: 8 g/dL — ABNORMAL LOW (ref 13.0–17.0)
MCH: 29 pg (ref 26.0–34.0)
MCHC: 30.8 g/dL (ref 30.0–36.0)
MCV: 94.2 fL (ref 80.0–100.0)
Platelets: 339 10*3/uL (ref 150–400)
RBC: 2.76 MIL/uL — ABNORMAL LOW (ref 4.22–5.81)
RDW: 15.8 % — ABNORMAL HIGH (ref 11.5–15.5)
WBC: 6.8 10*3/uL (ref 4.0–10.5)
nRBC: 0 % (ref 0.0–0.2)

## 2022-07-03 LAB — BASIC METABOLIC PANEL
Anion gap: 7 (ref 5–15)
BUN: 35 mg/dL — ABNORMAL HIGH (ref 8–23)
CO2: 23 mmol/L (ref 22–32)
Calcium: 8.6 mg/dL — ABNORMAL LOW (ref 8.9–10.3)
Chloride: 107 mmol/L (ref 98–111)
Creatinine, Ser: 0.65 mg/dL (ref 0.61–1.24)
GFR, Estimated: >60 mL/min (ref >60)
Glucose, Bld: 117 mg/dL — ABNORMAL HIGH (ref 70–99)
Potassium: 4.2 mmol/L (ref 3.5–5.1)
Sodium: 137 mmol/L (ref 135–145)

## 2022-07-03 LAB — PREALBUMIN: Prealbumin: 29 mg/dL (ref 18–38)

## 2022-07-03 LAB — ALBUMIN: Albumin: 2.7 g/dL — ABNORMAL LOW (ref 3.5–5.0)

## 2022-07-03 MED ORDER — SUCRALFATE 1 GM/10ML PO SUSP
1.0000 g | Freq: Three times a day (TID) | ORAL | Status: DC
  Administered 2022-07-05 – 2022-07-06 (×5): 1 g via ORAL
  Filled 2022-07-03 (×5): qty 10

## 2022-07-03 MED ORDER — ENOXAPARIN SODIUM 40 MG/0.4ML IJ SOSY
40.0000 mg | PREFILLED_SYRINGE | INTRAMUSCULAR | Status: DC
  Administered 2022-07-04: 40 mg via SUBCUTANEOUS
  Filled 2022-07-03 (×2): qty 0.4

## 2022-07-03 MED ORDER — SUCRALFATE 1 GM/10ML PO SUSP
1.0000 g | Freq: Three times a day (TID) | ORAL | Status: AC
  Administered 2022-07-03 (×2): 1 g via ORAL
  Filled 2022-07-03: qty 10

## 2022-07-03 MED ORDER — OSMOLITE 1.5 CAL PO LIQD
1000.0000 mL | ORAL | Status: AC
  Administered 2022-07-03: 1000 mL
  Filled 2022-07-03 (×2): qty 1000

## 2022-07-03 MED ORDER — LACTATED RINGERS IV SOLN: INTRAVENOUS | Status: DC

## 2022-07-03 MED ORDER — CEFAZOLIN SODIUM-DEXTROSE 2-4 GM/100ML-% IV SOLN
2.0000 g | Freq: Once | INTRAVENOUS | Status: AC
  Administered 2022-07-04: 2 g via INTRAVENOUS
  Filled 2022-07-03: qty 100

## 2022-07-03 NOTE — Progress Notes (Addendum)
Physical Therapy Treatment Patient Details Name: Jesse [name] MRN: [id number] DOB: 06-04-1950 Today's Date: 07/03/2022   History of Present Illness Patient is a 72 year old male who presented on 06/05/22 with severe fatigue and confusion, cyanosis and soft BPs. Admitted with sepsis from unspecified organism and PNA R lung with significant hx of lung and bladder CA currently on chemo and radiation treatments. Hospitalization complicated by new reduced EF and also poor oral intake manage/radiation esophagitis requiring tube feeding, GI following with possibility for PEG placement. Additionally, MRI on 06/11/22 revealed  Scattered small acute infarcts in the supratentorial and  infratentorial brain.  Pt with PMH CA, COPD, HLD, lung mass, bladder CA, macular degeneration, hx ileostomy, B knee surgery    PT Comments    Pt with gradual improvement and tolerance for activity.  He ambulated 15'x2 in room with min guard.  Pt overall not needing a lot of physical assistance, just limited by endurance.  Encouraged increased mobility with nursing throughout the day and educated pt on further HEP while in room. Did update POC due to improving tolerance and mobility.  Addendum:  Received message from [facility] that wife is concerned about return home with HHPT due to pt's weakness and difficulty managing at home.  Pt is only ambulating short distance and did fatigue easily.  Changed recommendation back to SNF as pt was having difficulty managing at home.  However, Pt is making good progress and if continues could potentially progress to HHPT level but would need 24 hr care (per eval has 24 hr care)    Recommendations for follow up therapy are one component of a multi-disciplinary discharge planning process, led by the attending physician.  Recommendations may be updated based on patient status, additional functional criteria and insurance authorization.  Follow Up Recommendations  Home health PT  ADDENDUM: Recommend  SNF due to difficulty managing at home but could update if continues to progress. See above.   Can patient physically be transported by private vehicle: Yes   Assistance Recommended at Discharge Intermittent Supervision/Assistance  Patient can return home with the following A little help with walking and/or transfers;A little help with bathing/dressing/bathroom;Assistance with cooking/housework;Assist for transportation;Help with stairs or ramp for entrance   Equipment Recommendations  None recommended by PT    Recommendations for Other Services       Precautions / Restrictions Precautions Precautions: Fall Precaution Comments: monitor HR, NG tube feeding     Mobility  Bed Mobility Overal bed mobility: Needs Assistance Bed Mobility: Supine to Sit, Sit to Supine     Supine to sit: Supervision Sit to supine: Supervision        Transfers Overall transfer level: Needs assistance Equipment used: Rolling walker (2 wheels) Transfers: Sit to/from Stand Sit to Stand: Min guard           General transfer comment: Min guard for safety; performed x 4 during session; cues for hand placement    Ambulation/Gait Ambulation/Gait assistance: Min guard Gait Distance (Feet): 15 Feet (15'x2) Assistive device: Rolling walker (2 wheels) Gait Pattern/deviations: Step-through pattern, Narrow base of support       General Gait Details: Min guard for safety but no LOB; pt did fatigue easily and required seated rest break   Stairs             Wheelchair Mobility    Modified Rankin (Stroke Patients Only) Modified Rankin (Stroke Patients Only) Pre-Morbid Rankin Score: No symptoms Modified Rankin: Moderately severe disability  Balance Overall balance assessment: Needs assistance Sitting-balance support: No upper extremity supported, Feet supported Sitting balance-[name]: Good     Standing balance support: Bilateral upper extremity supported, No upper extremity  supported Standing balance-[name]: Fair Standing balance comment: RW to ambulate but could balance witout UE support                            Cognition Arousal/Alertness: Awake/alert Behavior During Therapy: WFL for tasks assessed/performed Overall Cognitive Status: Within Functional Limits for tasks assessed                                 General Comments: Overall WFL during session.  A and O x 3, followed commands        Exercises General Exercises - Lower Extremity Long Arc Quad: AROM, Both, 5 reps, Seated Hip Flexion/Marching: AROM, Both, 5 reps, Seated Other Exercises Other Exercises: Partial bridge in supine x 5 tolerated well.   Encouraged pt to be up multiple x day with nursing in room (to bathroom, chair , etc).  Also educated on bed exercises including heel slides, pillow squeeze, hip abd, bridging (if tolerated), chair tricep pushups if able.  Discussed doing throughout the day as he easily fatigues in one bout.    General Comments General comments (skin integrity, edema, etc.): HR Max noted 115 bpm.  O2 sats stable.      Pertinent Vitals/Pain Pain Assessment Pain Assessment: No/denies pain    Home Living                          Prior Function            PT Goals (current goals can now be found in the care plan section) Progress towards PT goals: Progressing toward goals    Frequency    Min 3X/week      PT Plan Frequency needs to be updated;Discharge plan needs to be updated    Co-evaluation              AM-PAC PT "6 Clicks" Mobility   Outcome Measure  Help needed turning from your back to your side while in a flat bed without using bedrails?: A Little Help needed moving from lying on your back to sitting on the side of a flat bed without using bedrails?: A Little Help needed moving to and from a bed to a chair (including a wheelchair)?: A Little Help needed standing up from a chair using your  arms (e.g., wheelchair or bedside chair)?: A Little Help needed to walk in hospital room?: A Little Help needed climbing 3-5 steps with a railing? : A Lot 6 Click Score: 17    End of Session Equipment Utilized During Treatment: Gait belt Activity Tolerance: Patient tolerated treatment well Patient left: with call bell/phone within reach;in bed;with bed alarm set Nurse Communication: Mobility status PT Visit Diagnosis: Unsteadiness on feet (R26.81);Difficulty in walking, not elsewhere classified (R26.2);Muscle weakness (generalized) (M62.81)     Time: [id number] PT Time Calculation (min) (ACUTE ONLY): 21 min  Charges:  $Gait Training: 8-22 mins                     Abran Richard, PT Acute Rehab [facility] [place] 07/03/2022, 2:36 PM

## 2022-07-03 NOTE — Plan of Care (Signed)
  Problem: Clinical Measurements: Goal: Diagnostic test results will improve Outcome: Progressing   Problem: Clinical Measurements: Goal: Signs and symptoms of infection will decrease Outcome: Progressing   Problem: Respiratory: Goal: Ability to maintain adequate ventilation will improve Outcome: Progressing   

## 2022-07-03 NOTE — Progress Notes (Signed)
  Progress Note   Patient: Jesse [name] [id number] DOB: June 25, 1950 DOA: 06/05/2022     28 DOS: the patient was seen and examined on 07/03/2022 at 9:53AM      Brief hospital course: Mr. Kaster is a 72 y.o. 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   Found to have pneumonia, Klebsiella bacteremia and small embolic stroke   Hospitalization complicated by new reduced EF and also poor oral intake manage/radiation esophagitis requiring tube feeding, GI following.         Assessment and Plan: * Severe sepsis due to Klebsiella pneumonia and bacteremia Completed treatment  Acute metabolic encephalopathy Resolved  Esophageal dysphagia - Continue sucralfate - Continue PPI - Pursue PEG tube, planned tomorrow by GI  Acute ischemic stroke, likely embolic - Continue aspirin and Lipitor  Esophageal stricture S/p EGD and dilation  Protein-calorie malnutrition, severe - PEG planned - Continue tube feeds overnight - Continue mirtazapine  Pancytopenia due to chemotherapy Anemia Hgb remains low 8s/7s.    Stable, no clinical bleeding. Platelets and WBC normalized.  Acute on chronic diarrhea - Continue Questran - Start Imodium  Acute respiratory failure with hypoxia (HCC) Resolved.  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[place]) - Continue Gleevec  Adenocarcinoma of left lung, stage 2 ([place]) - Consult Oncology  Essential hypertension BP controlled - Continue metoprolol   Dyslipidemia - Continue atorvastatin          Subjective: Patient is feeling well, no confusion, no, no abdominal pain, no fever       Physical Exam: BP 116/76 (BP Location: Left Arm)   Pulse 88   Temp (!) 97.5 F (36.4 C) (Oral)    Resp 18   Ht 5\' 9"  (1.753 m)   Wt 59.5 kg   SpO2 100%   BMI 19.37 kg/m   Very thin elderly adult male, lying in bed, interactive and appropriate RRR, no murmurs, no peripheral edema Respiratory rate, normal, lungs clear without rales or wheezes Abdomen without focal tenderness palpation or guarding Attention normal, affect blunted, judgment insight appear normal    Data Reviewed: Discussed with general surgery CBC shows stable anemia, normal white blood cell count Basic metabolic panel shows normal renal function Albumin low  Family Communication: Wife at the bedside    Disposition: Status is: Inpatient Patient will require PICC placement, after PEG placement and once this is operational, he will be stable for discharge to rehab        Author: Edwin Dada, MD 07/03/2022 2:07 PM  For on call review [email or web address].

## 2022-07-03 NOTE — Progress Notes (Signed)
Patient Name: Jesse [name] Date of Encounter: 07/03/2022, 3:50 PM    Subjective  Still w/ dysphagia, odynophagia and malnutrition problems. IR unable to place gastrostomy due to overlying bowel so asked to help ? PEG in OR   Objective  BP 116/76 (BP Location: Left Arm)   Pulse 88   Temp (!) 97.5 F (36.4 C) (Oral)   Resp 18   Ht 5\' 9"  (1.753 m)   Wt 59.5 kg   SpO2 100%   BMI 19.37 kg/m  Thin, chronically ill wm w/ nasoenteric tube right nare Abd thin, soft NT - ileostomy R LQ area  Lab Results  Component Value Date   WBC 6.8 07/03/2022   HGB 8.0 (L) 07/03/2022   HCT 26.0 (L) 07/03/2022   MCV 94.2 07/03/2022   PLT 339 07/03/2022   Lab Results  Component Value Date   CREATININE 0.65 07/03/2022   BUN 35 (H) 07/03/2022   NA 137 07/03/2022   K 4.2 07/03/2022   CL 107 07/03/2022   CO2 23 07/03/2022   Lab Results  Component Value Date   ALT 21 06/12/2022   AST 24 06/12/2022   ALKPHOS 104 06/12/2022   BILITOT 0.5 06/12/2022   Lab Results  Component Value Date   LABPROT 12.7 06/29/2022   . IR Fluoro Rm 30-60 Min CLINICAL DATA:  72 year old with severe malnutrition and evaluation for percutaneous gastrostomy tube placement  EXAM: IR FLUORO RM 0-60 MIN  ANESTHESIA/SEDATION: None  MEDICATIONS: None  CONTRAST:  None  FLUOROSCOPY: Radiation Exposure Index (as provided by the fluoroscopic device): 13 mGy Kerma  PROCEDURE: Patient was placed supine on the interventional table. Air was injected through the patient's existing nasogastric tube. Fluoroscopy was used to look for a safe percutaneous window for tube placement. A percutaneous window could not be identified. Therefore, a gastrostomy tube procedure was not performed.  COMPLICATIONS: None immediate  FINDINGS: Nasogastric tube tip in the distal stomach body region. Large amount of bowel in the left upper abdomen. Based on the lateral view, this bowel is anterior to the stomach and there  is not a safe percutaneous window for gastrostomy tube placement.  IMPRESSION: No safe percutaneous window for gastrostomy tube placement. Therefore, gastrostomy tube placement was not attempted.  Electronically Signed   By: Markus Daft M.D.   On: 07/02/2022 17:33   CT abd/pelvis - 3/13 - images reviewed IMPRESSION: 1. New, bibasilar tree-in-bud pulmonary nodularities, suspicious for multifocal pneumonia. 2. Anatomy is potentially amenable for percutaneous gastrostomy placement, in consideration of mildly prominent air distended bowel loop medial to the stomach. Final decision making per performing clinician. Consider preprocedural PO contrast opacification of bowel and colon for better visualization. 3. Gastric placement of enteric feeding tube. 4. Nonobstructing nephrolithiasis and aortic Atherosclerosis (ICD10-I70.0). Additional incidental, chronic and senescent findings as above.  Assessment and Plan  Dysphagia/odynophagia + anorexia and malnutrition in setting of recent XRT for lung cancer EGD 3/4 - XRT esoiphagitis + Scahtzki ring St II adeno Ca left lung s/p resection, chemo/xrt Rectal GIST - LAR, ileostomy, chemo Acute ischemic stroke - suspect embolic - Plavix on hold Klebsiealla PNA and bacteremia - Tx/resolved  There is large and small bowel in vicinity of stomach and IR could not place gastrostomy  Will plan to try PEG in OR - if insufflation of stomach does not allow for good transillumination and gastrostomy placement then Dr. Marcello Moores will provide assistance with laparoscopy and manipulation of bowel.  Lovenox at 1800 is ok by  me since will not be in OR until 1300 tomorrow  The risks and benefits as well as alternatives of endoscopic procedure(s) have been discussed and reviewed. All questions answered. The patient agrees to proceed.  Gatha Mayer, MD, [facility] See Shea Evans on call - gastroenterology for best contact person 07/03/2022 3:56  PM

## 2022-07-03 NOTE — Progress Notes (Signed)
Nutrition Follow-up  DOCUMENTATION CODES:   Severe malnutrition in context of chronic illness  INTERVENTION:   - Continue cyclic tube feed of 16 hours (off 8 hours during the day) to help promote better appetite and encourage intake: -Osmolite 1.5 @ 49mL/hr x16 hours (1600-0800) via NGT  -60 ml Prosource TF daily -Recommend free water flush of 30 ml before and after pump connection to maintain tube patency -Tube feeding regimen provides 2120 kcals, 105 grams of protein, and 1036 ml of H2O.  -Can continue at goal once PEG placed and ready to use  -Ensure Plus High Protein po TID, each supplement provides 350 kcal and 20 grams of protein as tolerated - Pt's wife also bringing in Air traffic controller from home. -Provided handout "Maximizing Nutrition  During Cancer Treatment" for tips on dealing with taste changes and low appetite.    NUTRITION DIAGNOSIS:   Severe Malnutrition related to chronic illness (lung cancer currently undergoing chemotherapy and radiation therapy) as evidenced by severe fat depletion, severe muscle depletion, percent weight loss (15% in 6 months).  Ongoing.  GOAL:   Patient will meet greater than or equal to 90% of their needs  Meeting with TF  MONITOR:   PO intake, Diet advancement, Labs, Weight trends, TF tolerance   ASSESSMENT:   72 y.o. male with medical history significant of lung cancer currently undergoing chemotherapy and radiation therapy, HTN, HLD, history of bladder cancer, GERD, hypokalemia, recurrent anemia, COPD, macular degeneration who presented with severe fatigue and confusion.  2/20 -admitted 2/22- NGT removed by pt, TF held 2/2 vomiting, NGT replaced (10 french small bore feeding tube- weighted feeding catheter tip in distal stomach per KUB) 2/23- s/po BSE- advanced to dysphagia 3 diet with thin liquids 2/24- TF re-started 2/26- Calorie count  2/29 2nd calorie count 3/4 TF transition to 16 hour cyclic overnight  ([id number]) to promote better appetite 3/12 IR consult for G tube ordered 3/18 IR unable to place PEG  Patient awaiting PEG placement. IR unable to place 3/18. Surgery to place if able.  Pt is on full liquid diet. Receiving Osmolite 1.5 @ 85 ml/hr x 16 hours via NGT, tolerating. This will be goal if PEG is placed.   Admission weight: 144 lbs Current weight: 131 lbs  Medications: Remeron, Carafate  Labs reviewed:  CBGs: 101  Diet Order:   Diet Order             Diet full liquid Room service appropriate? Yes; Fluid consistency: Thin  Diet effective now                   EDUCATION NEEDS:   Education needs have been addressed  Skin:  Skin Assessment: Skin Integrity Issues: Skin Integrity Issues:: Other (Comment) Other: Non-pressure wound on vertebral column  Last BM:  3/18 -type 7  Height:   Ht Readings from Last 1 Encounters:  06/18/22 5\' 9"  (1.753 m)    Weight:   Wt Readings from Last 1 Encounters:  07/03/22 59.5 kg    BMI:  Body mass index is 19.37 kg/m.  Estimated Nutritional Needs:   Kcal:  1950-2300 kcals  Protein:  110-130 grams  Fluid:  >/= 1.9L   Clayton Bibles, MS, RD, LDN Inpatient Clinical Dietitian Contact information available via Am[employer]

## 2022-07-03 NOTE — H&P (View-Only) (Signed)
Patient Name: Jesse [name] Date of Encounter: 07/03/2022, 3:50 PM    Subjective  Still w/ dysphagia, odynophagia and malnutrition problems. IR unable to place gastrostomy due to overlying bowel so asked to help ? PEG in OR   Objective  BP 116/76 (BP Location: Left Arm)   Pulse 88   Temp (!) 97.5 F (36.4 C) (Oral)   Resp 18   Ht 5\' 9"  (1.753 m)   Wt 59.5 kg   SpO2 100%   BMI 19.37 kg/m  Thin, chronically ill wm w/ nasoenteric tube right nare Abd thin, soft NT - ileostomy R LQ area  Lab Results  Component Value Date   WBC 6.8 07/03/2022   HGB 8.0 (L) 07/03/2022   HCT 26.0 (L) 07/03/2022   MCV 94.2 07/03/2022   PLT 339 07/03/2022   Lab Results  Component Value Date   CREATININE 0.65 07/03/2022   BUN 35 (H) 07/03/2022   NA 137 07/03/2022   K 4.2 07/03/2022   CL 107 07/03/2022   CO2 23 07/03/2022   Lab Results  Component Value Date   ALT 21 06/12/2022   AST 24 06/12/2022   ALKPHOS 104 06/12/2022   BILITOT 0.5 06/12/2022   Lab Results  Component Value Date   LABPROT 12.7 06/29/2022   . IR Fluoro Rm 30-60 Min CLINICAL DATA:  72 year old with severe malnutrition and evaluation for percutaneous gastrostomy tube placement  EXAM: IR FLUORO RM 0-60 MIN  ANESTHESIA/SEDATION: None  MEDICATIONS: None  CONTRAST:  None  FLUOROSCOPY: Radiation Exposure Index (as provided by the fluoroscopic device): 13 mGy Kerma  PROCEDURE: Patient was placed supine on the interventional table. Air was injected through the patient's existing nasogastric tube. Fluoroscopy was used to look for a safe percutaneous window for tube placement. A percutaneous window could not be identified. Therefore, a gastrostomy tube procedure was not performed.  COMPLICATIONS: None immediate  FINDINGS: Nasogastric tube tip in the distal stomach body region. Large amount of bowel in the left upper abdomen. Based on the lateral view, this bowel is anterior to the stomach and there  is not a safe percutaneous window for gastrostomy tube placement.  IMPRESSION: No safe percutaneous window for gastrostomy tube placement. Therefore, gastrostomy tube placement was not attempted.  Electronically Signed   By: Markus Daft M.D.   On: 07/02/2022 17:33   CT abd/pelvis - 3/13 - images reviewed IMPRESSION: 1. New, bibasilar tree-in-bud pulmonary nodularities, suspicious for multifocal pneumonia. 2. Anatomy is potentially amenable for percutaneous gastrostomy placement, in consideration of mildly prominent air distended bowel loop medial to the stomach. Final decision making per performing clinician. Consider preprocedural PO contrast opacification of bowel and colon for better visualization. 3. Gastric placement of enteric feeding tube. 4. Nonobstructing nephrolithiasis and aortic Atherosclerosis (ICD10-I70.0). Additional incidental, chronic and senescent findings as above.  Assessment and Plan  Dysphagia/odynophagia + anorexia and malnutrition in setting of recent XRT for lung cancer EGD 3/4 - XRT esoiphagitis + Scahtzki ring St II adeno Ca left lung s/p resection, chemo/xrt Rectal GIST - LAR, ileostomy, chemo Acute ischemic stroke - suspect embolic - Plavix on hold Klebsiealla PNA and bacteremia - Tx/resolved  There is large and small bowel in vicinity of stomach and IR could not place gastrostomy  Will plan to try PEG in OR - if insufflation of stomach does not allow for good transillumination and gastrostomy placement then Dr. Marcello Moores will provide assistance with laparoscopy and manipulation of bowel.  Lovenox at 1800 is ok by  me since will not be in OR until 1300 tomorrow  The risks and benefits as well as alternatives of endoscopic procedure(s) have been discussed and reviewed. All questions answered. The patient agrees to proceed.  Gatha Mayer, MD, [facility] See Shea Evans on call - gastroenterology for best contact person 07/03/2022 3:56  PM

## 2022-07-03 NOTE — TOC Progression Note (Addendum)
Transition of Care [facility]) - Progression Note    Patient Details  Name: Jesse [name] MRN: [id number] Date of Birth: [birth year]  Transition of Care [facility]) CM/SW [place], RN Phone Number:[phone]  07/03/2022, 2:40 PM  Clinical Narrative:    CM received message from Dr. Loleta Books to make CM aware that PEG tube will be placed on Wed 3/20 and should be ready for use by Thursday. MD wants to know if CM thinks patient will have a bed offer and insurance auth. CM has reached out to spouse Pa [place] to make her aware of current bed offers. CM has provided wife with list of facilities that have previously made offers. Wife states that she will need to research facilities and get back with CM. CM will await return call from spouse.   1452 PT recommendation has now been changed to Home health PT. CM to update spouse. Patient can not be placed in SNF for short term rehab under medicare payment without therapy recommendation. CM spoke with wife to update on new recommendation. Wife inquiring about need for some rehab due to weakness and unable to manage at home. CM will reach out to therapy for definite answer on recommendation.     Expected Discharge Plan: [place] Barriers to Discharge: Continued Medical Work up  Expected Discharge Plan and Services In-house Referral: NA Discharge Planning Services: CM Consult Post Acute Care Choice: [place] Living arrangements for the past 2 months: [place]                 DME Arranged: N/A DME Agency: NA       HH Arranged: NA [place] Agency: NA         Social Determinants of Health (SDOH) Interventions SDOH Screenings   Food Insecurity: No Food Insecurity (06/06/2022)  Housing: Low Risk  (06/06/2022)  Transportation Needs: No Transportation Needs (06/06/2022)  Utilities: Not At Risk (06/06/2022)  Depression (PHQ2-9): Low Risk  (05/07/2022)  Financial Resource Strain: Low Risk  (07/18/2021)   Physical Activity: Sufficiently Active (07/18/2021)  Social Connections: Moderately Isolated (07/12/2020)  Stress: No Stress Concern Present (07/18/2021)  Tobacco Use: Medium Risk (07/03/2022)    Readmission Risk Interventions    06/29/2022    4:48 PM 06/11/2022    5:42 PM  Readmission Risk Prevention Plan  Transportation Screening Complete Complete  PCP or Specialist Appt within 3-5 Days  Complete  HRI or [place]  Complete  Social Work Consult for [place] Planning/Counseling  Complete  Palliative Care Screening  Not Applicable  Medication Review Press photographer) Complete Complete  PCP or Specialist appointment within 3-5 days of discharge Complete   HRI or [place] Complete   SW Recovery Care/Counseling Consult Complete   Palliative Care Screening Not [place] Complete

## 2022-07-03 NOTE — Consult Note (Signed)
Consult Note  Jesse [name] 1950-09-27  [id number].    Requesting MD: Myrene Buddy, MD Chief Complaint/Reason for Consult: need for feeding access HPI:  Patient is a 72 year old male with PMH significant for lung cancer s/p resection and chemo/radiation and rectal GIST s/p resection who was admitted to the hospital 06/05/22 with acute encephalopathy and hypoxia. He was found to have pneumonia, bacteremia and small embolic stroke. Also noted to have new LV dysfunction with reduced EF (40-45% on 2/27). He has improved from these acute issues. He has struggled with poor PO intake secondary to dysphagia and 40 lbs weight loss noted over last 6 months. He underwent EGD 3/4 which showed esophageal stricture and radiation esophagitis. Gentle esophageal dilation was done but patient still struggling to meet nutritional needs. IR was consulted for PEG placement but unable due to no safe percutaneous window. Patient currently tolerating some FLD and nocturnal tube feeding. He is having bowel function. Plavix was started for acute stroke but has been held since 3/12. Patient reports he was very active prior to the last few years and main goal is to get back to previous activities he enjoys. PMH otherwise significant for HTN and HLD. Previous abdominal surgery includes robotic LAR for GIST with diverting ileostomy and ileostomy closure. NKDA. He has abstained from smoking cigarettes since lung cancer diagnosis. Lives at home with his wife.   ROS: Negative other than HPI  Family History  Problem Relation Age of Onset   Dementia Mother    Diabetes Father    Mental illness Father    Peripheral vascular disease Brother     Past Medical History:  Diagnosis Date   Anemia    Cancer ([place])    bladder cancer   COPD (chronic obstructive pulmonary disease) ([place])    mild    DIVERTICULOSIS, COLON 01/14/2007   Qualifier: Diagnosis of  By: Paulina Fusi RN, [name]    GASTRITIS, CHRONIC 06/01/2004    Qualifier: Diagnosis of  By: Nolon Rod CMA (AAMA), [place]     GERD 01/09/2007   Qualifier: Diagnosis of  By: Sherren Mocha, RN, [place]     Heart murmur    as a child; no murmur heard 12/14/19   HYPERLIPIDEMIA 01/09/2007   Qualifier: Diagnosis of  By: Sherren Mocha RN, Dorian Pod     Hypertension    Lung mass    left upper nodule   Macular degeneration    right eye   NEOPLASM, MALIGNANT, BLADDER, HX OF 2002   Qualifier: Diagnosis of  By: Nolon Rod CMA (AAMA), [place]  / no chemo or radiation   OSTEOARTHRITIS 01/14/2007   Qualifier: Diagnosis of  By: Paulina Fusi, RN, [name] - knees   Rectal mass    Reflux esophagitis 06/01/2004   Qualifier: Diagnosis of  By: Nolon Rod CMA (AAMA), [place]     TOBACCO USER 02/16/2009   Qualifier: Diagnosis of  By: Burnice Logan  MD, Doretha Sou    VITAMIN D DEFICIENCY 06/03/2007   Qualifier: Diagnosis of  By: Burnice Logan  MD, Doretha Sou     Past Surgical History:  Procedure Laterality Date   BIOPSY  06/18/2022   Procedure: BIOPSY;  Surgeon: Lavena Bullion, DO;  Location: [facility];  Service: Gastroenterology;;   BREAST BIOPSY Left 03/16/2022   [place] LT RADIOACTIVE SEED LOC 03/16/2022 [facility]   BRONCHIAL BIOPSY  10/13/2019   Procedure: BRONCHIAL BIOPSIES;  Surgeon: Garner Nash, DO;  Location: [facility];  Service: Pulmonary;;   BRONCHIAL BRUSHINGS  10/13/2019   Procedure: BRONCHIAL BRUSHINGS;  Surgeon: Garner Nash, DO;  Location: [facility];  Service: Pulmonary;;   BRONCHIAL NEEDLE ASPIRATION BIOPSY  10/13/2019   Procedure: BRONCHIAL NEEDLE ASPIRATION BIOPSIES;  Surgeon: Garner Nash, DO;  Location: [facility];  Service: Pulmonary;;   BRONCHIAL NEEDLE ASPIRATION BIOPSY  04/11/2022   Procedure: BRONCHIAL NEEDLE ASPIRATION BIOPSIES;  Surgeon: Garner Nash, DO;  [facility];  Service: Cardiopulmonary;;   BRONCHIAL WASHINGS  10/13/2019   Procedure: BRONCHIAL WASHINGS;  Surgeon: Garner Nash, DO;  Location: [facility];  Service: Pulmonary;;    COLONOSCOPY     multiple   DIVERTING ILEOSTOMY N/A 12/14/2021   Procedure: DIVERTING ILEOSTOMY;  Surgeon: Michael Boston, MD;  [facility];  Service: General;  Laterality: N/A;   ENDOBRONCHIAL ULTRASOUND Bilateral 04/11/2022   Procedure: ENDOBRONCHIAL ULTRASOUND;  Surgeon: Garner Nash, DO;  [facility];  Service: Cardiopulmonary;  Laterality: Bilateral;   ESOPHAGEAL DILATION  06/18/2022   Procedure: ESOPHAGEAL DILATION;  Surgeon: Lavena Bullion, DO;  [facility];  Service: Gastroenterology;;   ESOPHAGOGASTRODUODENOSCOPY (EGD) WITH PROPOFOL N/A 06/18/2022   Procedure: ESOPHAGOGASTRODUODENOSCOPY (EGD) WITH PROPOFOL;  Surgeon: Lavena Bullion, DO;  [facility];  Service: Gastroenterology;  Laterality: N/A;  Severe dysphagia/odynophagia ?radiation esophagitis versus oral candidiasis   ILEOSTOMY CLOSURE N/A 03/22/2022   Procedure: OPEN TAKEDOWN OF LOOP ILEOSTOMY;  Surgeon: Michael Boston, MD;  [facility];  Service: General;  Laterality: N/A;  GEN w/ERAS PATHWAY LOCAL   INTERCOSTAL NERVE BLOCK Left 12/16/2019   Procedure: INTERCOSTAL NERVE BLOCK;  Surgeon: Lajuana Matte, MD;  Location: [facility];  Service: Thoracic;  Laterality: Left;   IR FLUORO RM 30-60 MIN  07/02/2022   KNEE SURGERY  07/20/2008   bilat.   NASAL SEPTUM SURGERY  1995   NODE DISSECTION Left 12/16/2019   Procedure: NODE DISSECTION;  Surgeon: Lajuana Matte, MD;  Location: [facility];  Service: Thoracic;  Laterality: Left;   RADIOACTIVE SEED GUIDED AXILLARY SENTINEL LYMPH NODE Left 03/20/2022   Procedure: RADIOACTIVE SEED GUIDED LEFT AXILLARY SENTINEL LYMPH NODE BIOPSY;  Surgeon: Erroll Luna, MD;  Location: [facility];  Service: General;  Laterality: Left;   RECTAL EXAM UNDER ANESTHESIA N/A 03/22/2022   Procedure: ANORECTAL EXAMINATION UNDER ANESTHESIA;  Surgeon: Michael Boston, MD;  [facility];  Service: General;  Laterality: N/A;   TONSILLECTOMY     TRANSURETHRAL RESECTION OF  BLADDER TUMOR  2002   UPPER GASTROINTESTINAL ENDOSCOPY     VIDEO BRONCHOSCOPY  04/11/2022   Procedure: VIDEO BRONCHOSCOPY;  Surgeon: Garner Nash, DO;  [facility];  Service: Cardiopulmonary;;   VIDEO BRONCHOSCOPY WITH ENDOBRONCHIAL NAVIGATION N/A 10/13/2019   Procedure: VIDEO BRONCHOSCOPY WITH ENDOBRONCHIAL NAVIGATION;  Surgeon: Garner Nash, DO;  Location: [facility];  Service: Pulmonary;  Laterality: N/A;   WISDOM TOOTH EXTRACTION     XI ROBOTIC ASSISTED LOWER ANTERIOR RESECTION N/A 12/14/2021   Procedure: XI ROBOTIC ASSISTED LOWER ANTERIOR ULTRA LOW RECTOSIGMOID RESECTION, COLOANAL HAND SEWN ANASTOMOSIS, AND BILATERAL TAP BLOCK;  Surgeon: Michael Boston, MD;  [facility];  Service: General;  Laterality: N/A;    Social History:  reports that he quit smoking about 2 years ago. His smoking use included cigarettes. He has a 20.00 pack-year smoking history. He has never used smokeless tobacco. He reports current alcohol use of about 15.0 standard drinks of alcohol per week. He reports that he does not currently use drugs after having used the following drugs:  Marijuana.  Allergies: No Known Allergies  Medications Prior to Admission  Medication Sig Dispense Refill   acetaminophen (TYLENOL) 500 MG tablet Take 1,000 mg by mouth every 8 (eight) hours as needed for moderate pain.     amLODipine (NORVASC) 5 MG tablet Take 1 tablet (5 mg total) by mouth daily. 90 tablet 3   atorvastatin (LIPITOR) 40 MG tablet Take 1/2 tablet (20 mg total) by mouth daily. 90 tablet 3   Cholecalciferol (VITAMIN D3 PO) Take 2,000 Units by mouth daily.     cholestyramine (QUESTRAN) 4 GM/DOSE powder Take 4 g by mouth 2 (two) times daily.     diphenoxylate-atropine (LOMOTIL) 2.5-0.025 MG tablet Take 2 tablets by mouth 4 (four) times daily as needed for diarrhea or loose stools.     ferrous sulfate 325 (65 FE) MG EC tablet Take 1 tablet (325 mg total) by mouth 2 (two) times daily. 60 tablet 1    imatinib (GLEEVEC) 400 MG tablet Take 1 tablet (400 mg total) by mouth daily. Take with meals and large glass of water.Caution:Chemotherapy. (Patient taking differently: Take 400 mg by mouth at bedtime. Take with meals and large glass of water.Caution:Chemotherapy.) 30 tablet 3   loperamide (IMODIUM A-D) 2 MG tablet Take 4 mg by mouth 4 (four) times daily as needed for diarrhea or loose stools.     megestrol (MEGACE ES) 625 MG/5ML suspension TAKE 5 MLS (625 MG TOTAL) BY MOUTH DAILY. 450 mL 1   mirtazapine (REMERON) 15 MG tablet Take 1 tablet (15 mg total) by mouth at bedtime. 30 tablet 2   Multiple Vitamins-Minerals (PRESERVISION AREDS 2) CAPS Take 1 capsule by mouth 2 (two) times daily.     omeprazole (PRILOSEC) 40 MG capsule Take 1 capsule (40 mg total) by mouth daily. 90 capsule 3   ondansetron (ZOFRAN) 4 MG tablet Take 4 mg by mouth every 8 (eight) hours as needed for nausea or vomiting.     OVER THE COUNTER MEDICATION Take 3 tablets by mouth daily.  Balance of Nature Fruits     OVER THE COUNTER MEDICATION Take 3 tablets by mouth daily. Balance of Nature Vegetable     polycarbophil (FIBERCON) 625 MG tablet Take 2,500 mg by mouth daily. Taking 4 tabs ( 625 mg each)  daily     prochlorperazine (COMPAZINE) 10 MG tablet Take 1 tablet (10 mg total) by mouth every 6 (six) hours as needed for nausea or vomiting. 30 tablet 0   sucralfate (CARAFATE) 1 g tablet Take 1 tablet by mouth 4 times daily -  with meals and at bedtime. 60 tablet 1   potassium chloride SA (KLOR-CON M) 20 MEQ tablet Take 1 tablet (20 mEq total) by mouth daily. 7 tablet 0    Blood pressure 126/72, pulse 100, temperature (!) 97.3 F (36.3 C), temperature source Oral, resp. rate 16, height 5\' 9"  (1.753 m), weight 59.5 kg, SpO2 99 %. Physical Exam:  General: pleasant, WD, cachectic male who is laying in bed in NAD HEENT: head is normocephalic, atraumatic.  Sclera are noninjected.  EOMI.  Ears and nose without any masses or lesions.   Mouth is pink and moist Heart: regular, rate, and rhythm.  Normal s1,s2. No obvious murmurs, gallops, or rubs noted.  Lungs: CTAB, no wheezes, rhonchi, or rales noted.  Respiratory effort nonlabored Abd: soft, NT, ND, +BS, surgical scars well healed MS: all 4 extremities are symmetrical with no cyanosis, clubbing, or edema. Skin: warm and dry with no masses, lesions, or  rashes Neuro: Cranial nerves 2-12 grossly intact, sensation is normal throughout Psych: A&Ox3 with an appropriate affect.   Results for orders placed or performed during the hospital encounter of 06/05/22 (from the past 48 hour(s))  Magnesium     Status: None   Collection Time: 07/02/22  6:33 AM  Result Value Ref Range   Magnesium 2.0 1.7 - 2.4 mg/dL    Comment: Performed at [facility], [place] [address], [place], [place] [address]  CBC     Status: Abnormal   Collection Time: 07/03/22  6:46 AM  Result Value Ref Range   WBC 6.8 4.0 - 10.5 K/uL   RBC 2.76 (L) 4.22 - 5.81 MIL/uL   Hemoglobin 8.0 (L) 13.0 - 17.0 g/dL   HCT 26.0 (L) 39.0 - 52.0 %   MCV 94.2 80.0 - 100.0 fL   MCH 29.0 26.0 - 34.0 pg   MCHC 30.8 30.0 - 36.0 g/dL   RDW 15.8 (H) 11.5 - 15.5 %   Platelets 339 150 - 400 K/uL   nRBC 0.0 0.0 - 0.2 %    Comment: Performed at [facility], [place] [address] [place], [place] [id number]  Basic metabolic panel     Status: Abnormal   Collection Time: 07/03/22  6:46 AM  Result Value Ref Range   Sodium 137 135 - 145 mmol/L   Potassium 4.2 3.5 - 5.1 mmol/L   Chloride 107 98 - 111 mmol/L   CO2 23 22 - 32 mmol/L   Glucose, Bld 117 (H) 70 - 99 mg/dL    Comment: Glucose reference range applies only to samples taken after fasting for at least 8 hours.   BUN 35 (H) 8 - 23 mg/dL   Creatinine, Ser 0.65 0.61 - 1.24 mg/dL   Calcium 8.6 (L) 8.9 - 10.3 mg/dL   GFR, Estimated >60 >60 mL/min    Comment: (NOTE) Calculated using the CKD-EPI Creatinine Equation (2021)    Anion gap 7 5 - 15     Comment: Performed at [facility], [place] [address] [place], [place] [address]   IR Fluoro Rm 30-60 Min  Result Date: 07/02/2022 CLINICAL DATA:  72 year old with severe malnutrition and evaluation for percutaneous gastrostomy tube placement EXAM: IR FLUORO RM 0-60 MIN ANESTHESIA/SEDATION: None MEDICATIONS: None CONTRAST:  None FLUOROSCOPY: Radiation Exposure Index (as provided by the fluoroscopic device): 13 mGy Kerma PROCEDURE: Patient was placed supine on the interventional table. Air was injected through the patient's existing nasogastric tube. Fluoroscopy was used to look for a safe percutaneous window for tube placement. A percutaneous window could not be identified. Therefore, a gastrostomy tube procedure was not performed. COMPLICATIONS: None immediate FINDINGS: Nasogastric tube tip in the distal stomach body region. Large amount of bowel in the left upper abdomen. Based on the lateral view, this bowel is anterior to the stomach and there is not a safe percutaneous window for gastrostomy tube placement. IMPRESSION: No safe percutaneous window for gastrostomy tube placement. Therefore, gastrostomy tube placement was not attempted. Electronically Signed   By: Markus Daft M.D.   On: 07/02/2022 17:33      Assessment/Plan Severe protein calorie malnutrition Radiation induced esophagitis and esophageal stricture  - s/p EGD 3/4 - pt reports 40 lb weight loss over the last 6 months  - tolerating TF and having bowel function  - IR unable to place PEG - discussed open vs laparoscopic gastrostomy placement with patient including risks of bleeding, infection, injury to other structures,  risks of anesthesia. Will also discuss with GI to see if they are available to assist with laparoscopic assisted PEG. He would like to discuss with his wife as well when she is here later today but is hoping to proceed. His primary goal is to get back to the things he enjoys doing like hiking, walking and  gardening.   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   Norm Parcel, [facility] Surgery 07/03/2022, 8:41 AM Please see Amion for pager number during day hours 7:00am-4:30pm

## 2022-07-04 ENCOUNTER — Encounter (HOSPITAL_COMMUNITY)
Admission: EM | Disposition: A | Discharge status: Skilled Nursing Facility | Payer: Self-pay | Source: Home / Self Care | Attending: Internal Medicine

## 2022-07-04 ENCOUNTER — Inpatient Hospital Stay (HOSPITAL_COMMUNITY): Payer: Medicare Other | Admitting: Anesthesiology

## 2022-07-04 ENCOUNTER — Encounter (HOSPITAL_COMMUNITY): Payer: Self-pay | Admitting: Internal Medicine

## 2022-07-04 DIAGNOSIS — E46 Unspecified protein-calorie malnutrition: Secondary | ICD-10-CM

## 2022-07-04 DIAGNOSIS — Z87891 Personal history of nicotine dependence: Secondary | ICD-10-CM

## 2022-07-04 DIAGNOSIS — I10 Essential (primary) hypertension: Secondary | ICD-10-CM

## 2022-07-04 DIAGNOSIS — E43 Unspecified severe protein-calorie malnutrition: Secondary | ICD-10-CM

## 2022-07-04 DIAGNOSIS — J449 Chronic obstructive pulmonary disease, unspecified: Secondary | ICD-10-CM

## 2022-07-04 HISTORY — PX: PEG PLACEMENT: SHX5437

## 2022-07-04 LAB — CBC
HCT: 25.8 % — ABNORMAL LOW (ref 39.0–52.0)
Hemoglobin: 7.9 g/dL — ABNORMAL LOW (ref 13.0–17.0)
MCH: 29.2 pg (ref 26.0–34.0)
MCHC: 30.6 g/dL (ref 30.0–36.0)
MCV: 95.2 fL (ref 80.0–100.0)
Platelets: 282 10*3/uL (ref 150–400)
RBC: 2.71 MIL/uL — ABNORMAL LOW (ref 4.22–5.81)
RDW: 15.8 % — ABNORMAL HIGH (ref 11.5–15.5)
WBC: 6.1 10*3/uL (ref 4.0–10.5)
nRBC: 0 % (ref 0.0–0.2)

## 2022-07-04 LAB — BASIC METABOLIC PANEL
Anion gap: 10 (ref 5–15)
BUN: 32 mg/dL — ABNORMAL HIGH (ref 8–23)
CO2: 25 mmol/L (ref 22–32)
Calcium: 8.9 mg/dL (ref 8.9–10.3)
Chloride: 102 mmol/L (ref 98–111)
Creatinine, Ser: 0.63 mg/dL (ref 0.61–1.24)
GFR, Estimated: >60 mL/min (ref >60)
Glucose, Bld: 99 mg/dL (ref 70–99)
Potassium: 3.8 mmol/L (ref 3.5–5.1)
Sodium: 137 mmol/L (ref 135–145)

## 2022-07-04 LAB — MAGNESIUM: Magnesium: 2 mg/dL (ref 1.7–2.4)

## 2022-07-04 LAB — TYPE AND SCREEN
ABO/RH(D): O POS
Antibody Screen: NEGATIVE

## 2022-07-04 SURGERY — INSERTION, PEG TUBE
Anesthesia: General

## 2022-07-04 MED ORDER — GLYCOPYRROLATE 0.2 MG/ML IJ SOLN
INTRAMUSCULAR | Status: AC
  Filled 2022-07-04: qty 1

## 2022-07-04 MED ORDER — DEXAMETHASONE SODIUM PHOSPHATE 10 MG/ML IJ SOLN
INTRAMUSCULAR | Status: AC
  Filled 2022-07-04: qty 1

## 2022-07-04 MED ORDER — FENTANYL CITRATE (PF) 100 MCG/2ML IJ SOLN
INTRAMUSCULAR | Status: AC
  Filled 2022-07-04: qty 2

## 2022-07-04 MED ORDER — OSMOLITE 1.5 CAL PO LIQD
1000.0000 mL | ORAL | Status: AC
  Administered 2022-07-04: 1000 mL
  Filled 2022-07-04: qty 1000

## 2022-07-04 MED ORDER — SUCCINYLCHOLINE CHLORIDE 200 MG/10ML IV SOSY
PREFILLED_SYRINGE | INTRAVENOUS | Status: AC
  Filled 2022-07-04: qty 10

## 2022-07-04 MED ORDER — FENTANYL CITRATE PF 50 MCG/ML IJ SOSY: 25.0000 ug | PREFILLED_SYRINGE | INTRAMUSCULAR | Status: DC | PRN

## 2022-07-04 MED ORDER — ONDANSETRON HCL 4 MG/2ML IJ SOLN
INTRAMUSCULAR | Status: DC | PRN
  Administered 2022-07-04: 4 mg via INTRAVENOUS

## 2022-07-04 MED ORDER — SUGAMMADEX SODIUM 200 MG/2ML IV SOLN
INTRAVENOUS | Status: DC | PRN
  Administered 2022-07-04: 200 mg via INTRAVENOUS

## 2022-07-04 MED ORDER — LIDOCAINE 1 % OPTIME INJ - NO CHARGE
INTRAMUSCULAR | Status: DC | PRN
  Administered 2022-07-04: 3 mL

## 2022-07-04 MED ORDER — PROPOFOL 10 MG/ML IV BOLUS
INTRAVENOUS | Status: DC | PRN
  Administered 2022-07-04: 120 mg via INTRAVENOUS

## 2022-07-04 MED ORDER — LIDOCAINE 2% (20 MG/ML) 5 ML SYRINGE
INTRAMUSCULAR | Status: DC | PRN
  Administered 2022-07-04: 60 mg via INTRAVENOUS

## 2022-07-04 MED ORDER — LACTATED RINGERS IV SOLN: Freq: Once | INTRAVENOUS | Status: AC

## 2022-07-04 MED ORDER — FENTANYL CITRATE (PF) 100 MCG/2ML IJ SOLN
INTRAMUSCULAR | Status: DC | PRN
  Administered 2022-07-04: 50 ug via INTRAVENOUS

## 2022-07-04 MED ORDER — DEXAMETHASONE SODIUM PHOSPHATE 10 MG/ML IJ SOLN
INTRAMUSCULAR | Status: DC | PRN
  Administered 2022-07-04: 10 mg via INTRAVENOUS

## 2022-07-04 MED ORDER — SUCCINYLCHOLINE CHLORIDE 200 MG/10ML IV SOSY
PREFILLED_SYRINGE | INTRAVENOUS | Status: DC | PRN
  Administered 2022-07-04: 120 mg via INTRAVENOUS

## 2022-07-04 MED ORDER — ONDANSETRON HCL 4 MG/2ML IJ SOLN: 4.0000 mg | Freq: Once | INTRAMUSCULAR | Status: DC | PRN

## 2022-07-04 MED ORDER — KETOROLAC TROMETHAMINE 30 MG/ML IJ SOLN: 30.0000 mg | Freq: Once | INTRAMUSCULAR | Status: DC | PRN

## 2022-07-04 MED ORDER — PHENYLEPHRINE 80 MCG/ML (10ML) SYRINGE FOR IV PUSH (FOR BLOOD PRESSURE SUPPORT)
PREFILLED_SYRINGE | INTRAVENOUS | Status: AC
  Filled 2022-07-04: qty 10

## 2022-07-04 MED ORDER — CHLORHEXIDINE GLUCONATE 0.12 % MT SOLN
15.0000 mL | Freq: Once | OROMUCOSAL | Status: DC
  Filled 2022-07-04: qty 15

## 2022-07-04 MED ORDER — PROPOFOL 10 MG/ML IV BOLUS
INTRAVENOUS | Status: AC
  Filled 2022-07-04: qty 20

## 2022-07-04 MED ORDER — ROCURONIUM BROMIDE 10 MG/ML (PF) SYRINGE
PREFILLED_SYRINGE | INTRAVENOUS | Status: DC | PRN
  Administered 2022-07-04: 20 mg via INTRAVENOUS

## 2022-07-04 MED ORDER — PHENYLEPHRINE 80 MCG/ML (10ML) SYRINGE FOR IV PUSH (FOR BLOOD PRESSURE SUPPORT)
PREFILLED_SYRINGE | INTRAVENOUS | Status: DC | PRN
  Administered 2022-07-04: 240 ug via INTRAVENOUS

## 2022-07-04 MED ORDER — ROCURONIUM BROMIDE 10 MG/ML (PF) SYRINGE
PREFILLED_SYRINGE | INTRAVENOUS | Status: AC
  Filled 2022-07-04: qty 10

## 2022-07-04 MED ORDER — ONDANSETRON HCL 4 MG/2ML IJ SOLN
INTRAMUSCULAR | Status: AC
  Filled 2022-07-04: qty 2

## 2022-07-04 MED ORDER — LIDOCAINE HCL (PF) 2 % IJ SOLN
INTRAMUSCULAR | Status: AC
  Filled 2022-07-04: qty 5

## 2022-07-04 MED ORDER — EPHEDRINE 5 MG/ML INJ
INTRAVENOUS | Status: AC
  Filled 2022-07-04: qty 5

## 2022-07-04 SURGICAL SUPPLY — 42 items
ADH SKN CLS APL DERMABOND .7 (GAUZE/BANDAGES/DRESSINGS)
APL PRP STRL LF DISP 70% ISPRP (MISCELLANEOUS) ×1
APL SKNCLS STERI-STRIP NONHPOA (GAUZE/BANDAGES/DRESSINGS)
BAG COUNTER SPONGE SURGICOUNT (BAG) IMPLANT
BAG SPNG CNTER NS LX DISP (BAG)
BENZOIN TINCTURE PRP APPL 2/3 (GAUZE/BANDAGES/DRESSINGS) IMPLANT
BNDG ADH 1X3 SHEER STRL LF (GAUZE/BANDAGES/DRESSINGS) ×2 IMPLANT
BNDG ADH THN 3X1 STRL LF (GAUZE/BANDAGES/DRESSINGS) ×2
CABLE HIGH FREQUENCY MONO STRZ (ELECTRODE) IMPLANT
CHLORAPREP W/TINT 26 (MISCELLANEOUS) ×1 IMPLANT
COVER SURGICAL LIGHT HANDLE (MISCELLANEOUS) ×1 IMPLANT
DERMABOND ADVANCED .7 DNX12 (GAUZE/BANDAGES/DRESSINGS) IMPLANT
G-TUBE MIC BOLUS 22FR ENFIT (TUBING) IMPLANT
G-TUBE MIC BOLUS 24FR ENFIT (CATHETERS) IMPLANT
GLOVE BIOGEL PI IND STRL 7.0 (GLOVE) ×1 IMPLANT
GLOVE SURG SS PI 7.0 STRL IVOR (GLOVE) ×1 IMPLANT
GOWN STRL REUS W/ TWL LRG LVL3 (GOWN DISPOSABLE) ×1 IMPLANT
GOWN STRL REUS W/ TWL XL LVL3 (GOWN DISPOSABLE) IMPLANT
GOWN STRL REUS W/TWL LRG LVL3 (GOWN DISPOSABLE) ×1
GOWN STRL REUS W/TWL XL LVL3 (GOWN DISPOSABLE)
IRRIG SUCT STRYKERFLOW 2 WTIP (MISCELLANEOUS)
IRRIGATION SUCT STRKRFLW 2 WTP (MISCELLANEOUS) IMPLANT
KIT BASIN OR (CUSTOM PROCEDURE TRAY) ×1 IMPLANT
KIT TURNOVER KIT A (KITS) IMPLANT
LUBRICANT JELLY K Y 4OZ (MISCELLANEOUS) IMPLANT
PENCIL SMOKE EVACUATOR (MISCELLANEOUS) IMPLANT
SCISSORS LAP 5X35 DISP (ENDOMECHANICALS) IMPLANT
SET IRRIG Y TYPE TUR BLADDER L (SET/KITS/TRAYS/PACK) IMPLANT
SET TUBE SMOKE EVAC HIGH FLOW (TUBING) ×1 IMPLANT
SLEEVE Z-THREAD 5X100MM (TROCAR) ×1 IMPLANT
SPIKE FLUID TRANSFER (MISCELLANEOUS) ×1 IMPLANT
SPONGE DRAIN TRACH 4X4 STRL 2S (GAUZE/BANDAGES/DRESSINGS) IMPLANT
STRIP CLOSURE SKIN 1/2X4 (GAUZE/BANDAGES/DRESSINGS) IMPLANT
SUT ETHILON 2 0 PS N (SUTURE) IMPLANT
SUT MNCRL AB 4-0 PS2 18 (SUTURE) ×1 IMPLANT
TOWEL OR 17X26 10 PK STRL BLUE (TOWEL DISPOSABLE) ×1 IMPLANT
TOWEL OR NON WOVEN STRL DISP B (DISPOSABLE) IMPLANT
TRAY LAPAROSCOPIC (CUSTOM PROCEDURE TRAY) ×1 IMPLANT
TROCAR Z-THREAD OPTICAL 5X100M (TROCAR) ×1 IMPLANT
TUBE ENDOVIVE SAFETY PEG 24 (TUBING) IMPLANT
TUBE GASTRO BOLUS 22FR ENFIT (TUBING) IMPLANT
TUBE GASTRO BOLUS 24FR ENFIT (CATHETERS) IMPLANT

## 2022-07-04 NOTE — Transfer of Care (Signed)
Immediate Anesthesia Transfer of Care Note  Patient: Jesse [name]  Procedure(s) Performed: PERCUTANEOUS ENDOSCOPIC GASTROSTOMY (PEG) PLACEMENT  Patient Location: PACU  Anesthesia Type:General  Level of Consciousness: sedated  Airway & Oxygen Therapy: Patient Spontanous Breathing and Patient connected to face mask oxygen  Post-op Assessment: Report given to RN and Post -op Vital signs reviewed and stable  Post vital signs: Reviewed and stable  Last Vitals:  Vitals Value Taken Time  BP 119/74 07/04/22 1439  Temp    Pulse 78 07/04/22 1441  Resp 18 07/04/22 1441  SpO2 100 % 07/04/22 1441  Vitals shown include unvalidated device data.  Last Pain:  Vitals:   07/04/22 1247  TempSrc:   PainSc: 0-No pain      Patients Stated Pain Goal: 0 ([id number])  Complications: No notable events documented.

## 2022-07-04 NOTE — Op Note (Signed)
[facility] Patient Name: Jesse [name] Procedure Date: 07/04/2022 MRN: [id number] Attending MD: Gatha Mayer , MD, [id number] Date of Birth: 08-06-1950 CSN: [id number] Age: 72 Admit Type: Inpatient Procedure:                Upper GI endoscopy Indications:              Dysphagia, Malnutrition Providers:                Gatha Mayer, MD, Jaci Carrel, RN, William Dalton, Technician Referring MD:              Medicines:                General Anesthesia, Ancef 123XX123 mg IV Complications:            No immediate complications. Estimated Blood Loss:     Estimated blood loss was minimal. Procedure:                Pre-Anesthesia Assessment:                           - Prior to the procedure, a History and Physical                            was performed, and patient medications and                            allergies were reviewed. The patient's tolerance of                            previous anesthesia was also reviewed. The risks                            and benefits of the procedure and the sedation                            options and risks were discussed with the patient.                            All questions were answered, and informed consent                            was obtained. Prior Anticoagulants: The patient                            last took Lovenox (enoxaparin) 1 day prior to the                            procedure. ASA Grade Assessment: III - A patient                            with severe systemic disease. After reviewing the  risks and benefits, the patient was deemed in                            satisfactory condition to undergo the procedure.                           After obtaining informed consent, the endoscope was                            passed under direct vision. Throughout the                            procedure, the patient's blood pressure, pulse, and                             oxygen saturations were monitored continuously. The                            [id number] Olympus endoscope was introduced                            through the mouth, and advanced to the second part                            of duodenum. The upper GI endoscopy was                            accomplished without difficulty. The patient                            tolerated the procedure well. Scope In: Scope Out: Findings:      The esophagus was normal.      The stomach was normal.      The examined duodenum was normal.      The patient was placed in the supine position for PEG placement. The       stomach was insufflated to appose gastric and abdominal walls. A site       was located in the body of the stomach with excellent transillumination       for placement. The abdominal wall was marked and prepped in a sterile       manner. The area was anesthetized with 3 mL of 1% lidocaine. The trocar       needle was introduced through the abdominal wall and into the stomach       under direct endoscopic view. A snare was introduced through the       endoscope and opened in the gastric lumen. The guide wire was passed       through the trocar and into the open snare. The snare was closed around       the guide wire. The endoscope and snare were removed, pulling the wire       out through the mouth. A skin incision was made at the site of needle       insertion. The externally removable 24 Fr EndoVive Safety gastrostomy       tube was lubricated. The G-tube was tied to the  guide wire and pulled       through the mouth and into the stomach. The trocar needle was removed,       and the gastrostomy tube was pulled out from the stomach through the       skin. The external bumper was attached to the gastrostomy tube, and the       tube was cut to remove the guide wire. The final position of the       gastrostomy tube was confirmed by relook endoscopy, and skin marking        noted to be 3 cm at the external bumper. The final tension and       compression of the abdominal wall by the PEG tube and external bumper       were checked and revealed that the bumper was loose and not touching the       skin. The feeding tube was capped, and the tube site cleaned and       dressed. Estimated blood loss was minimal. Impression:               - Normal esophagus.                           - Normal stomach.                           - Normal examined duodenum.                           - An externally removable PEG placement was                            successfully completed.                           - No specimens collected. Moderate Sedation:      Not Applicable - Patient had care per Anesthesia. Recommendation:           - Return patient to hospital ward for ongoing care.                           - Clear liqs today                           Can use tube for meds and will restart tube feeds                            at 30 cc /hr and ramp up starting tomorrow if ok                           OK to resume Lovenox tonight                           If ok tomorrow restart Plavix Procedure Code(s):        --- Professional ---                           [phone], Esophagogastroduodenoscopy, flexible,  transoral; with directed placement of percutaneous                            gastrostomy tube Diagnosis Code(s):        --- Professional ---                           R13.10, Dysphagia, unspecified                           E46, Unspecified protein-calorie malnutrition CPT copyright 2022 [employer]. All rights reserved. The codes documented in this report are preliminary and upon coder review may  be revised to meet current compliance requirements. Gatha Mayer, MD 07/04/2022 3:08:16 PM This report has been signed electronically. Number of Addenda: 0

## 2022-07-04 NOTE — Brief Op Note (Signed)
07/04/2022  2:40 PM  PATIENT:  Jesse [name]  72 y.o. male  PRE-OPERATIVE DIAGNOSIS:  Servere Protein Calorie Malnutrition  POST-OPERATIVE DIAGNOSIS:  Servere Protein Calorie Malnutrition  PROCEDURE:  Procedure(s): PERCUTANEOUS ENDOSCOPIC GASTROSTOMY (PEG) PLACEMENT (N/A)  SURGEON:  Surgeon(s) and Role:    Gatha Mayer, MD - Primary   ANESTHESIA:   GETT  EBL:  minimal  BLOOD ADMINISTERED: None    LOCAL MEDICATIONS USED:  lidocaine   DICTATION: endoscopy report Provation to Epic       EGD Normal - successful 24 Fr PEG placement Ancef 1 g IV prophylaxis  Full report to follow

## 2022-07-04 NOTE — Interval H&P Note (Signed)
History and Physical Interval Note:  07/04/2022 1:46 PM  Jesse [name]  has presented today for surgery, with the diagnosis of Servere Protein Calorie Malnutrition.  The various methods of treatment have been discussed with the patient and family. After consideration of risks, benefits and other options for treatment, the patient has consented to  Procedure(s): PERCUTANEOUS ENDOSCOPIC GASTROSTOMY (PEG) PLACEMENT (N/A) LAPAROSCOPY DIAGNOSTIC (N/A) as a surgical intervention.  The patient's history has been reviewed, patient examined, no change in status, stable for surgery.  I have reviewed the patient's chart and labs.  Questions were answered to the patient's satisfaction.     Silvano Rusk

## 2022-07-04 NOTE — Anesthesia Preprocedure Evaluation (Signed)
Anesthesia Evaluation  Patient identified by MRN, date of birth, ID band Patient awake  General Assessment Comment:72 year old with severe malnutrition   Reviewed: Allergy & Precautions, H&P , NPO status , Patient's Chart, lab work & pertinent test results  Airway Mallampati: II  TM Distance: >3 FB Neck ROM: Full    Dental no notable dental hx.    Pulmonary COPD, former smoker   Pulmonary exam normal breath sounds clear to auscultation       Cardiovascular hypertension, Normal cardiovascular exam Rhythm:Regular Rate:Normal     Neuro/Psych negative neurological ROS  negative psych ROS   GI/Hepatic Neg liver ROS, hiatal hernia,GERD  ,,  Endo/Other  negative endocrine ROS    Renal/GU negative Renal ROS  negative genitourinary   Musculoskeletal negative musculoskeletal ROS (+)    Abdominal   Peds negative pediatric ROS (+)  Hematology  (+) Blood dyscrasia, anemia   Anesthesia Other Findings   Reproductive/Obstetrics negative OB ROS                             Anesthesia Physical Anesthesia Plan  ASA: 3  Anesthesia Plan: General   Post-op Pain Management:    Induction: Intravenous and Rapid sequence  PONV Risk Score and Plan: 2 and Ondansetron, Dexamethasone and Treatment may vary due to age or medical condition  Airway Management Planned: Oral ETT  Additional Equipment:   Intra-op Plan:   Post-operative Plan: Extubation in OR  Informed Consent: I have reviewed the patients History and Physical, chart, labs and discussed the procedure including the risks, benefits and alternatives for the proposed anesthesia with the patient or authorized representative who has indicated his/her understanding and acceptance.     Dental advisory given  Plan Discussed with: CRNA and Surgeon  Anesthesia Plan Comments:        Anesthesia Quick Evaluation

## 2022-07-04 NOTE — Progress Notes (Signed)
Daily Progress Note  DOA: 06/05/2022 Hospital Day: 30 Chief Complaint: dysphagia / malnutrition   ASSESSMENT    72 yo male with tthe following:   Dysphagia / malnutrition / radiation esophagitis / Schatzki's ring.  IR couldn't place feeding tube. Large amount of bowel in the left upper abdomen.Bowel is anterior to the stomach and there was not a safe percutaneous window for gastrostomy tube placement.   Severe sepsis due to Klebsiella pneumonia and bacteremia Treatment completed.   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 On Gleevac  Chronic loose stool Takes Questran  Stage 2 left lung adenocarcinoma, s/p resection and chemoradiation  Pancytopenia 2/2 to chemotherapy. Today's counts are stable   PLAN   Will attempt PEG placement in OR today. if insufflation of stomach does not allow for good transillumination and gastrostomy placement then Dr. Marcello Moores will provide assistance with laparoscopy and manipulation of bowel   Subjective / New events:   No complaints.   Recent Labs    07/03/22 0646 07/04/22 0602  WBC 6.8 6.1  HGB 8.0* 7.9*  HCT 26.0* 25.8*  PLT 339 282   BMET Recent Labs    07/03/22 0646 07/04/22 0602  NA 137 137  K 4.2 3.8  CL 107 102  CO2 23 25  GLUCOSE 117* 99  BUN 35* 32*  CREATININE 0.65 0.63  CALCIUM 8.6* 8.9   LFT Recent Labs    07/03/22 0932  ALBUMIN 2.7*   PT/INR No results for input(s): "LABPROT", "INR" in the last 72 hours.   Scheduled inpatient medications:   aspirin  81 mg Oral Daily   atorvastatin  20 mg Oral Daily   cholestyramine light  4 g Oral QID   enoxaparin (LOVENOX) injection  40 mg Subcutaneous Q24H   feeding supplement  237 mL Oral TID BM   feeding supplement (PROSource TF20)  60 mL Per Tube Daily   lidocaine  15 mL Mouth/Throat TID AC & HS   metoprolol tartrate  25 mg Oral BID   mirtazapine  7.5 mg Oral QHS   mouth rinse  15 mL Mouth Rinse 4 times  per day   pantoprazole  40 mg Oral Daily   [START ON 07/05/2022] sucralfate  1 g Oral TID WC & HS   Continuous inpatient infusions:   sodium chloride 10 mL/hr at 06/15/22 2022    ceFAZolin (ANCEF) IV     lactated ringers     PRN inpatient medications: sodium chloride, acetaminophen **OR** acetaminophen, alum & mag hydroxide-simeth, HYDROcodone-acetaminophen, ipratropium-albuterol, lidocaine, liver oil-zinc oxide, loperamide, morphine injection, mouth rinse  Vital signs in last 24 hours: Temp:  [97.5 F (36.4 C)-98 F (36.7 C)] 98 F (36.7 C) (03/20 0453) Pulse Rate:  [88-108] 95 (03/20 0453) Resp:  [18] 18 (03/20 0453) BP: (115-119)/(73-77) 119/73 (03/20 0453) SpO2:  [97 %-100 %] 97 % (03/20 0453) Weight:  [59.8 kg] 59.8 kg (03/20 0701) Last BM Date : 07/04/22  Intake/Output Summary (Last 24 hours) at 07/04/2022 1033 Last data filed at 07/04/2022 0000 Gross per 24 hour  Intake 1020 ml  Output 350 ml  Net 670 ml    Intake/Output from previous day: 03/19 0701 - 03/20 0700 In: 1020 [NG/GT:1020] Out: 350 [Urine:350] Intake/Output this shift: No intake/output data recorded.   Physical Exam:  General: Alert thin male in NAD Heart:  Regular rate and rhythm.  Pulmonary: Normal respiratory effort Abdomen: Soft, nondistended, nontender. Normal bowel sounds. Neurologic:  Alert and oriented Psych: Pleasant. Cooperative. Insight appears normal.    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

## 2022-07-04 NOTE — Progress Notes (Signed)
Occupational Therapy Treatment Patient Details Name: Jesse [name] MRN: [id number] DOB: [birth year] Today's Date: 07/04/2022   History of present illness Patient is a 72 year old male who presented on 06/05/22 with severe fatigue and confusion, cyanosis and soft BPs. Admitted with sepsis from unspecified organism and PNA R lung with significant hx of lung and bladder CA currently on chemo and radiation treatments. Hospitalization complicated by new reduced EF and also poor oral intake manage/radiation esophagitis requiring tube feeding, GI following with possibility for PEG placement. Additionally, MRI on 06/11/22 revealed  Scattered small acute infarcts in the supratentorial and  infratentorial brain.  Pt with PMH CA, COPD, HLD, lung mass, bladder CA, macular degeneration, hx ileostomy, B knee surgery   OT comments  Patient's care plan meeting was held in room today per patient request with wife, nurse, care manager and this therapist present. Patients current level of assistance for ADLs and different d/c options were reviewed. Patients wife continues to have concerns over patients pending procedure today and recovery time. Patients wife was educated that MD is going to proceed with d/c only when patient is medically stable. Patient was able to engage in min guard for bed mobility, toileting hygiene, and LB dressing tasks seated in recliner with wife present.  Patient's discharge plan remains appropriate at this time. OT will continue to follow acutely.     Recommendations for follow up therapy are one component of a multi-disciplinary discharge planning process, led by the attending physician.  Recommendations may be updated based on patient status, additional functional criteria and insurance authorization.    Follow Up Recommendations  Skilled nursing-short term rehab (<3 hours/day)     Assistance Recommended at Discharge Frequent or constant Supervision/Assistance  Patient can return home with  the following  Assistance with cooking/housework;Direct supervision/assist for medications management;Assist for transportation;Help with stairs or ramp for entrance;A little help with walking and/or transfers;A little help with bathing/dressing/bathroom   Equipment Recommendations  BSC/3in1       Precautions / Restrictions Precautions Precautions: Fall Precaution Comments: monitor HR, NG tube feeding Restrictions Weight Bearing Restrictions: No       Mobility Bed Mobility Overal bed mobility: Needs Assistance Bed Mobility: Supine to Sit, Sit to Supine Rolling: Supervision Sidelying to sit: Supervision   Sit to supine: Supervision   General bed mobility comments: with use of bed rail           ADL either performed or assessed with clinical judgement   ADL Overall ADL's : Needs assistance/impaired                     Lower Body Dressing: Supervision/safety;Set up;Sitting/lateral leans Lower Body Dressing Details (indicate cue type and reason): sitting in recliner to don/doff fresh socks, patient bent in half to complete reporting he could complete figure four but did not want to. Toilet Transfer: Min guard;Ambulation;Rolling walker (2 wheels) Toilet Transfer Details (indicate cue type and reason): to walk to the sink and back in room. Toileting- Water quality scientist and Hygiene: Min guard;Sit to/from stand Toileting - Clothing Manipulation Details (indicate cue type and reason): with liquid stool, patient declined to use restroom to attempt to void at this time.       General ADL Comments: patient was approached this AM per caseworker and wife request to call wife for update on patient status and recommendations. patients wife was contacted via phone call and husband strongly requested that wife come to the hosptial to have converstation and would  not continue this conversation in person. time was scheduled and case manager agreeable to participate in this time as  well. patient was approached at agreed upon time. patient, wife, nurse, casemanager and this therapist had discussion on patients current level of function for ADLs and the possiblity to transition home with 24/7 supervision to continue therapy services from home given current level of assistance.patient is pending surgery later this afternoon. patients wife expressed concerns over home with 24/7 recommendations reporting that she is not a trained professional and would not be able to help him physically. patients wife was educated on patietns current level of assistance and patients decreased functional activity tolerance being more of an issue than his ability to complete ADL tasks. patient was educated on importance of getting out of bed multiple times a day with staff and participating more with therapy. patient verbalzied understanding and attempted to decline but did engage in above. patient does have stairs to get up to get to bedrooms per wife report they do not have a bedroom on 1st level. caremanager to address further concerns over recommendaitons with wife at this time.      Cognition Arousal/Alertness: Awake/alert Behavior During Therapy: WFL for tasks assessed/performed Overall Cognitive Status: Within Functional Limits for tasks assessed                               Pertinent Vitals/ Pain       Pain Assessment Pain Assessment: No/denies pain         Frequency  Min 2X/week        Progress Toward Goals  OT Goals(current goals can now be found in the care plan section)  Progress towards OT goals: Progressing toward goals     Plan Discharge plan remains appropriate       AM-PAC OT "6 Clicks" Daily Activity     Outcome Measure   Help from another person eating meals?: None Help from another person taking care of personal grooming?: A Little Help from another person toileting, which includes using toliet, bedpan, or urinal?: A Little Help from another  person bathing (including washing, rinsing, drying)?: A Little Help from another person to put on and taking off regular upper body clothing?: A Little Help from another person to put on and taking off regular lower body clothing?: A Little 6 Click Score: 19    End of Session Equipment Utilized During Treatment: Rolling walker (2 wheels)  OT Visit Diagnosis: Other abnormalities of gait and mobility (R26.89);Unsteadiness on feet (R26.81);Muscle weakness (generalized) (M62.81);Other symptoms and signs involving cognitive function;Pain   Activity Tolerance Patient limited by fatigue   Patient Left with call bell/phone within reach;in bed;with bed alarm set;with nursing/sitter in room;with family/visitor present   Nurse Communication Mobility status        Time: [id number] OT Time Calculation (min): 25 min  Charges: OT General Charges $OT Visit: 1 Visit OT Treatments $Self Care/Home Management : 8-22 mins $Therapeutic Activity: 8-22 mins  Rennie Plowman, MS Acute Rehabilitation Department Office# [phone]   Willa Rough 07/04/2022, 12:52 PM

## 2022-07-04 NOTE — Plan of Care (Signed)
  Problem: Clinical Measurements: Goal: Signs and symptoms of infection will decrease Outcome: Progressing   Problem: Respiratory: Goal: Ability to maintain adequate ventilation will improve Outcome: Progressing   Problem: Health Behavior/Discharge Planning: Goal: Ability to manage health-related needs will improve Outcome: Progressing

## 2022-07-04 NOTE — Progress Notes (Signed)
Severe sepsis ([place])  Subjective: Pt ready to proceed with PEG  Objective: Vital signs in last 24 hours: Temp:  [97.6 F (36.4 C)-98 F (36.7 C)] 97.6 F (36.4 C) (03/20 1230) Pulse Rate:  [90-108] 90 (03/20 1230) Resp:  [18] 18 (03/20 1230) BP: (110-119)/(73-77) 110/77 (03/20 1230) SpO2:  [97 %-99 %] 98 % (03/20 1230) Weight:  [59.8 kg] 59.8 kg (03/20 1247) Last BM Date : 07/04/22  Intake/Output from previous day: 03/19 0701 - 03/20 0700 In: 1020 [NG/GT:1020] Out: 350 [Urine:350] Intake/Output this shift: Total I/O In: -  Out: 200 [Urine:200]  General appearance: alert and cooperative GI: soft, non-distended  Lab Results:  Results for orders placed or performed during the hospital encounter of 06/05/22 (from the past 24 hour(s))  Type and screen [place]     Status: None   Collection Time: 07/04/22  6:01 AM  Result Value Ref Range   ABO/RH(D) O POS    Antibody Screen NEG    Sample Expiration      07/07/2022,2359 Performed at [facility], [place] [address], [place], [place] [address]   Magnesium     Status: None   Collection Time: 07/04/22  6:02 AM  Result Value Ref Range   Magnesium 2.0 1.7 - 2.4 mg/dL  Basic metabolic panel     Status: Abnormal   Collection Time: 07/04/22  6:02 AM  Result Value Ref Range   Sodium 137 135 - 145 mmol/L   Potassium 3.8 3.5 - 5.1 mmol/L   Chloride 102 98 - 111 mmol/L   CO2 25 22 - 32 mmol/L   Glucose, Bld 99 70 - 99 mg/dL   BUN 32 (H) 8 - 23 mg/dL   Creatinine, Ser 0.63 0.61 - 1.24 mg/dL   Calcium 8.9 8.9 - 10.3 mg/dL   GFR, Estimated >60 >60 mL/min   Anion gap 10 5 - 15  CBC     Status: Abnormal   Collection Time: 07/04/22  6:02 AM  Result Value Ref Range   WBC 6.1 4.0 - 10.5 K/uL   RBC 2.71 (L) 4.22 - 5.81 MIL/uL   Hemoglobin 7.9 (L) 13.0 - 17.0 g/dL   HCT 25.8 (L) 39.0 - 52.0 %   MCV 95.2 80.0 - 100.0 fL   MCH 29.2 26.0 - 34.0 pg   MCHC 30.6 30.0 - 36.0 g/dL   RDW 15.8 (H) 11.5  - 15.5 %   Platelets 282 150 - 400 K/uL   nRBC 0.0 0.0 - 0.2 %     Studies/Results Radiology     MEDS, Scheduled  [MAR Hold] aspirin  81 mg Oral Daily   [MAR Hold] atorvastatin  20 mg Oral Daily   chlorhexidine  15 mL Mouth/Throat Once   [MAR Hold] cholestyramine light  4 g Oral QID   [MAR Hold] enoxaparin (LOVENOX) injection  40 mg Subcutaneous Q24H   [MAR Hold] feeding supplement  237 mL Oral TID BM   [MAR Hold] feeding supplement (PROSource TF20)  60 mL Per Tube Daily   [MAR Hold] lidocaine  15 mL Mouth/Throat TID AC & HS   [MAR Hold] metoprolol tartrate  25 mg Oral BID   [MAR Hold] mirtazapine  7.5 mg Oral QHS   [MAR Hold] mouth rinse  15 mL Mouth Rinse 4 times per day   [MAR Hold] pantoprazole  40 mg Oral Daily   [MAR Hold] sucralfate  1 g Oral TID WC & HS     Assessment: Severe  sepsis ([place]) malnutrition  Plan: OR today for PEG with possible lap assistance.  Risks include bleeding, infection and damage to adjacent structures.     LOS: 43 days    Rosario Adie, MD [facility], [place]  Patient's medical decision making was straightforward (25 mins met or exceeded with patient care and documentation).   07/04/2022 1:45 PM

## 2022-07-04 NOTE — Assessment & Plan Note (Signed)
Mild, stable, asymptomatic 

## 2022-07-04 NOTE — Progress Notes (Signed)
  Progress Note   Patient: Jesse [name] [id number] DOB: 10-05-1950 DOA: 06/05/2022     29 DOS: the patient was seen and examined on 07/04/2022 at 8:58AM      Brief hospital course: Mr. Kleinknecht is a 72 y.o. 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   Found to have pneumonia, Klebsiella bacteremia and small embolic stroke   Hospitalization complicated by new reduced EF and also poor oral intake manage/radiation esophagitis requiring tube feeding, GI following.         Assessment and Plan: * Severe sepsis due to Klebsiella pneumonia and bacteremia Completed treatment  Acute metabolic encephalopathy Resolved  Esophageal dysphagia GI were consulted due to dysphagia and persistently poor oral intake.  He underwent EGD that showed radiation esophagitis, and the overall assessment was that his dysphagia was from a combination of mostly radiaiton injury, but compounded by Schatzki ring, oral candidaisis, and delirium.  SLP and dietitian were consulted, and patient underwent close monitoring, but even despite treatment (and resolution of candidiasis), dilation of stricture, and resolution of delirium, his oral intake appaers to be less than 50% of estimated needs.  Given his already severe weight loss, PEG tube was recommended.  PEG placed 3/20 by GI - Appreciate GI and Surgery expertise - Continue sucralfate - Continue PPI, mirtazapine - Continue diet advancement as able - TFs at 30cc/hr today, ramp up tomorrow    Acute ischemic stroke, likely embolic - Continue aspirin and Lipitor - Hold Plavix today, may resume tomorrow  Esophageal stricture S/p EGD and dilation  Protein-calorie malnutrition, severe See above  Pancytopenia due to chemotherapy Anemia Stable, no clinical bleeding. Platelets and WBC normalized.  Acute on  chronic diarrhea - Continue Questran, Imodium  Acute respiratory failure with hypoxia (HCC) Resolved.  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     Physical Exam: BP 127/78 (BP Location: Left Arm)   Pulse 86   Temp 98 F (36.7 C) (Oral)   Resp 17   Ht 5\' 9"  (1.753 m)   Wt 59.8 kg   SpO2 96%   BMI 19.47 kg/m   Thin adult male, lying in bed, no acute distress, interactive RRR, no murmurs, no peripheral edema Respiratory normal, lungs clear without rales or wheezes NG tube in place Attentive to conversation, affect normal, judgment insight appear normal, face symmetric, speech fluent    Data Reviewed: Magnesium normal Basic metabolic panel unremarkable CBC shows stable hemoglobin at 7.9    Family Communication: None present    Disposition: Status is: Inpatient         Author: Edwin Dada, MD 07/04/2022 3:43 PM  For on call review [email or web address].

## 2022-07-04 NOTE — Anesthesia Procedure Notes (Signed)
Procedure Name: Intubation Date/Time: 07/04/2022 1:57 PM  Performed by: Lind Covert, CRNAPre-anesthesia Checklist: Patient identified, Emergency Drugs available, Suction available, Patient being monitored and Timeout performed Patient Re-evaluated:Patient Re-evaluated prior to induction Oxygen Delivery Method: Circle system utilized Preoxygenation: Pre-oxygenation with 100% oxygen Induction Type: IV induction, Rapid sequence and Cricoid Pressure applied Laryngoscope Size: Mac and 4 Grade View: Grade I Tube type: Oral Tube size: 7.5 mm Number of attempts: 1 Airway Equipment and Method: Stylet Placement Confirmation: ETT inserted through vocal cords under direct vision, positive ETCO2 and breath sounds checked- equal and bilateral Secured at: 23 cm Tube secured with: Tape Dental Injury: Teeth and Oropharynx as per pre-operative assessment

## 2022-07-04 NOTE — TOC Progression Note (Addendum)
Transition of Care [facility]) - Progression Note    Patient Details  Name: Jesse [name] MRN: [id number] Date of Birth: 14-Jun-1950  Transition of Care [facility]) CM/SW [place], RN Phone [phone]  07/04/2022, 10:39 AM  Clinical Narrative:    CM received call from Spouse requesting to know if PT recommendation has been updated. CM has explained the new PT recommendation to wife and informed wife that CM will initiated insurance auth based on recommendation but can not guarantee that SNF will be approved by insurance because of the addendum and patient having potential to go home with home health. Wife is concerned because she feels that patient is weak and will need some short term rehab. Wife is requesting to meet with rehab at the bedside. Wife has been made aware that CM will follow up. Wife has decided on [facility] for SNF placement. CM will follow up on  bed offer.   Message has been sent to PT/OT to make them aware of wifes request to meet with them PT currently on another unit but OT is currently at bedside. Wife states that she can not be here now but requesting that OT call her. Message has been relayed to OT. OT to call wife.   1045 CM has received message that wife will meet with therapy at bedside today. TOC continues to follow for SNF placement.   1125 CM has confirmed that [facility] can still offer bed per [facility]. Insurance Josem Kaufmann has been initiated via [employer] ID # [id number]    Expected Discharge Plan: [place] Barriers to Discharge: Continued Medical Work up  Expected Discharge Plan and Services In-house Referral: NA Discharge Planning Services: CM Consult Post Acute Care Choice: [place] Living arrangements for the past 2 months: [place]                 DME Arranged: N/A DME Agency: NA       HH Arranged: NA [place] Agency: NA         Social Determinants of Health  (SDOH) Interventions SDOH Screenings   Food Insecurity: No Food Insecurity (06/06/2022)  Housing: Low Risk  (06/06/2022)  Transportation Needs: No Transportation Needs (06/06/2022)  Utilities: Not At Risk (06/06/2022)  Depression (PHQ2-9): Low Risk  (05/07/2022)  Financial Resource Strain: Low Risk  (07/18/2021)  Physical Activity: Sufficiently Active (07/18/2021)  Social Connections: Moderately Isolated (07/12/2020)  Stress: No Stress Concern Present (07/18/2021)  Tobacco Use: Medium Risk (07/03/2022)    Readmission Risk Interventions    06/29/2022    4:48 PM 06/11/2022    5:42 PM  Readmission Risk Prevention Plan  Transportation Screening Complete Complete  PCP or Specialist Appt within 3-5 Days  Complete  HRI or [place]  Complete  Social Work Consult for [place] Planning/Counseling  Complete  Palliative Care Screening  Not Applicable  Medication Review Press photographer) Complete Complete  PCP or Specialist appointment within 3-5 days of discharge Complete   HRI or [place] Complete   SW Recovery Care/Counseling Consult Complete   Palliative Care Screening Not [place] Complete

## 2022-07-05 ENCOUNTER — Encounter (HOSPITAL_COMMUNITY): Payer: Self-pay | Admitting: Internal Medicine

## 2022-07-05 DIAGNOSIS — J9691 Respiratory failure, unspecified with hypoxia: Secondary | ICD-10-CM

## 2022-07-05 DIAGNOSIS — K209 Esophagitis, unspecified without bleeding: Secondary | ICD-10-CM

## 2022-07-05 DIAGNOSIS — D61818 Other pancytopenia: Secondary | ICD-10-CM

## 2022-07-05 DIAGNOSIS — K529 Noninfective gastroenteritis and colitis, unspecified: Secondary | ICD-10-CM

## 2022-07-05 DIAGNOSIS — C49A5 Gastrointestinal stromal tumor of rectum: Secondary | ICD-10-CM

## 2022-07-05 MED ORDER — CLOPIDOGREL BISULFATE 75 MG PO TABS
75.0000 mg | ORAL_TABLET | Freq: Every day | ORAL | Status: DC
  Administered 2022-07-05 – 2022-07-06 (×2): 75 mg via ORAL
  Filled 2022-07-05 (×2): qty 1

## 2022-07-05 MED ORDER — OSMOLITE 1.5 CAL PO LIQD
1000.0000 mL | Freq: Every day | ORAL | Status: DC
  Administered 2022-07-05: 1000 mL
  Filled 2022-07-05 (×2): qty 1000

## 2022-07-05 NOTE — Progress Notes (Signed)
Physical Therapy Treatment Patient Details Name: Jesse [name] MRN: [id number] DOB: 20-Aug-1950 Today's Date: 07/05/2022   History of Present Illness Patient is a 72 year old male who presented on 06/05/22 with severe fatigue and confusion, cyanosis and soft BPs. Admitted with sepsis from unspecified organism and PNA R lung with significant hx of lung and bladder CA currently on chemo and radiation treatments. Hospitalization complicated by new reduced EF and also poor oral intake manage/radiation esophagitis requiring tube feeding, GI following with possibility for PEG placement. Additionally, MRI on 06/11/22 revealed  Scattered small acute infarcts in the supratentorial and  infratentorial brain.  Pt with PMH CA, COPD, HLD, lung mass, bladder CA, macular degeneration, hx ileostomy, B knee surgery    PT Comments    The patient is eager to ambulate today. Patient demonstrates improved balance and endurance with activity.   Recommendations for follow up therapy are one component of a multi-disciplinary discharge planning process, led by the attending physician.  Recommendations may be updated based on patient status, additional functional criteria and insurance authorization.  Follow Up Recommendations  Skilled nursing-short term rehab (<3 hours/day) Can patient physically be transported by private vehicle: Yes   Assistance Recommended at Discharge Intermittent Supervision/Assistance  Patient can return home with the following A little help with walking and/or transfers;A little help with bathing/dressing/bathroom;Assistance with cooking/housework;Assist for transportation;Help with stairs or ramp for entrance   Equipment Recommendations  None recommended by PT    Recommendations for Other Services       Precautions / Restrictions Precautions Precautions: Fall Precaution Comments: PEG     Mobility  Bed Mobility Overal bed mobility: Independent                  Transfers    Equipment used: Rolling walker (2 wheels)   Sit to Stand: Supervision           General transfer comment: stands  from toilet  with rail    Ambulation/Gait Ambulation/Gait assistance: Min assist Gait Distance (Feet): 15 Feet (then 60' x 2) Assistive device: Rolling walker (2 wheels) Gait Pattern/deviations: Step-through pattern, Narrow base of support Gait velocity: decreased     General Gait Details: Min guard for safety but no LOB; pt did fatigue but much improved  endurance   Stairs             Wheelchair Mobility    Modified Rankin (Stroke Patients Only)       Balance   Sitting-balance support: No upper extremity supported, Feet supported Sitting balance-[name]: Good     Standing balance support: During functional activity, No upper extremity supported Standing balance-[name]: Fair Standing balance comment: standing in BR , washing hands                            Cognition Arousal/Alertness: Awake/alert Behavior During Therapy: WFL for tasks assessed/performed Overall Cognitive Status: Within Functional Limits for tasks assessed                                 General Comments: Overall WFL during session.  A and O x 3, followed commands        Exercises      General Comments        Pertinent Vitals/Pain Pain Assessment Pain Assessment: No/denies pain    Home Living  Prior Function            PT Goals (current goals can now be found in the care plan section) Acute Rehab PT Goals Patient Stated Goal: be able to work in garden PT Goal Formulation: With patient/family Time For Goal Achievement: 07/19/22 Potential to Achieve Goals: Good Progress towards PT goals: Progressing toward goals    Frequency    Min 3X/week      PT Plan Current plan remains appropriate    Co-evaluation              AM-PAC PT "6 Clicks" Mobility   Outcome Measure  Help  needed turning from your back to your side while in a flat bed without using bedrails?: None Help needed moving from lying on your back to sitting on the side of a flat bed without using bedrails?: None Help needed moving to and from a bed to a chair (including a wheelchair)?: A Little Help needed standing up from a chair using your arms (e.g., wheelchair or bedside chair)?: A Little Help needed to walk in hospital room?: A Little Help needed climbing 3-5 steps with a railing? : A Lot 6 Click Score: 19    End of Session Equipment Utilized During Treatment: Gait belt Activity Tolerance: Patient tolerated treatment well Patient left: in bed;with call bell/phone within reach;with bed alarm set;with family/visitor present Nurse Communication: Mobility status PT Visit Diagnosis: Unsteadiness on feet (R26.81);Difficulty in walking, not elsewhere classified (R26.2);Muscle weakness (generalized) (M62.81)     Time: [id number] PT Time Calculation (min) (ACUTE ONLY): 15 min  Charges:  $Gait Training: 8-22 mins                     [place] Office [phone] Weekend [id number]    Claretha Cooper 07/05/2022, 4:17 PM

## 2022-07-05 NOTE — TOC Progression Note (Addendum)
Transition of Care [facility]) - Progression Note    Patient Details  Name: Jesse [name] MRN: [id number] Date of Birth: 07/29/1950  Transition of Care [facility]) CM/SW [place], RN Phone Number:[phone]  07/05/2022, 8:46 AM  Clinical Narrative:    Insurance auth pending  [employer] requested update on details of tube feeding. CM has provided this information to Terri @ [phone] option 8. CM advised to check navi portal for updates.   [place] has been approved Plan auth ID [id number] auth id [id number] dates 3/22-3/26. CM spoke with admissions director Whitney to make aware of auth with begin date 3/22. Loree Fee confirms that patient can admit tomorrow. MD Danford made aware. Wife pam [place] has been updated.   Expected Discharge Plan: [place] Barriers to Discharge: Continued Medical Work up  Expected Discharge Plan and Services In-house Referral: NA Discharge Planning Services: CM Consult Post Acute Care Choice: [place] Living arrangements for the past 2 months: [place]                 DME Arranged: N/A DME Agency: NA       HH Arranged: NA [place] Agency: NA         Social Determinants of Health (SDOH) Interventions SDOH Screenings   Food Insecurity: No Food Insecurity (06/06/2022)  Housing: Low Risk  (06/06/2022)  Transportation Needs: No Transportation Needs (06/06/2022)  Utilities: Not At Risk (06/06/2022)  Depression (PHQ2-9): Low Risk  (05/07/2022)  Financial Resource Strain: Low Risk  (07/18/2021)  Physical Activity: Sufficiently Active (07/18/2021)  Social Connections: Moderately Isolated (07/12/2020)  Stress: No Stress Concern Present (07/18/2021)  Tobacco Use: Medium Risk (07/04/2022)    Readmission Risk Interventions    06/29/2022    4:48 PM 06/11/2022    5:42 PM  Readmission Risk Prevention Plan  Transportation Screening Complete Complete  PCP or Specialist Appt within 3-5 Days   Complete  HRI or [place]  Complete  Social Work Consult for [place] Planning/Counseling  Complete  Palliative Care Screening  Not Applicable  Medication Review Press photographer) Complete Complete  PCP or Specialist appointment within 3-5 days of discharge Complete   HRI or [place] Complete   SW Recovery Care/Counseling Consult Complete   Palliative Care Screening Not [place] Complete

## 2022-07-05 NOTE — Progress Notes (Signed)
Nutrition Follow-up  DOCUMENTATION CODES:   Severe malnutrition in context of chronic illness  INTERVENTION:   -Continue cyclic tube feed of 16 hours (off 8 hours during the day) to help promote better appetite and encourage intake: -Osmolite 1.5 @ 87mL/hr x16 hours (1600-0800) via NGT  -60 ml Prosource TF daily -Recommend free water flush of 30 ml before and after pump connection to maintain tube patency -Tube feeding regimen provides 2120 kcals, 105 grams of protein, and 1036 ml of H2O.     -Ensure Plus High Protein po TID, each supplement provides 350 kcal and 20 grams of protein as tolerated - Pt's wife also bringing in [employer] and   Ensure Compleat from home. -Provided handout "Maximizing Nutrition  During Cancer Treatment" for tips on dealing with taste changes and low appetite on 3/12  -Provided tube feeding and dysphagia 3 diet handouts  -Routing note and plan to [place] RDs for possible outpatient follow-up.   NUTRITION DIAGNOSIS:   Severe Malnutrition related to chronic illness (lung cancer currently undergoing chemotherapy and radiation therapy) as evidenced by severe fat depletion, severe muscle depletion, percent weight loss (15% in 6 months).  Ongoing.  GOAL:   Patient will meet greater than or equal to 90% of their needs  Meeting with tube feeding  MONITOR:   PO intake, Diet advancement, Labs, Weight trends, TF tolerance  ASSESSMENT:   72 y.o. male with medical history significant of lung cancer currently undergoing chemotherapy and radiation therapy, HTN, HLD, history of bladder cancer, GERD, hypokalemia, recurrent anemia, COPD, macular degeneration who presented with severe fatigue and confusion.  2/20 -admitted 2/22- NGT removed by pt, TF held 2/2 vomiting, NGT replaced (10 french small bore feeding tube- weighted feeding catheter tip in distal stomach per KUB) 2/23- s/po BSE- advanced to dysphagia 3 diet with thin liquids 2/24- TF  re-started 2/26- Calorie count  2/29 2nd calorie count 3/4 TF transition to 16 hour cyclic overnight ([id number]) to promote better appetite 3/12 IR consult for G tube ordered 3/18 IR unable to place PEG  Patient in room, wife at bedside. Pt states he is feeling much better and has some energy since PEG placed and tube feeding has begun. Tolerated 30 ml/hr overnight for 16 hours. Per MD, running currently at 85 ml/hr to assess tolerance. Will d/c on night regimen as recommended above.  Diet was discussed, RD provided handout on Dysphagia 3/mechanical soft diet later today. As well as tube feeding handout with written tube feeding instructions. Pt will benefit from outpatient follow-up, will route note to [place] RDs.  Admission weight: 144 lbs Current weight: 128 lbs  Medications: Remeron, Carafate  Labs reviewed.  Diet Order:   Diet Order             DIET DYS 3 Room service appropriate? Yes; Fluid consistency: Thin  Diet effective now                   EDUCATION NEEDS:   Education needs have been addressed  Skin:  Skin Assessment: Skin Integrity Issues: Skin Integrity Issues:: Other (Comment) Other: Non-pressure wound on vertebral column  Last BM:  3/20 -type 6  Height:   Ht Readings from Last 1 Encounters:  07/04/22 5\' 9"  (1.753 m)    Weight:   Wt Readings from Last 1 Encounters:  07/05/22 58.3 kg    BMI:  Body mass index is 18.98 kg/m.  Estimated Nutritional Needs:   Kcal:  1950-2300 kcals  Protein:  110-130 grams  Fluid:  >/= 1.9L  Jesse Bibles, MS, RD, LDN Inpatient Clinical Dietitian Contact information available via Amion

## 2022-07-05 NOTE — Anesthesia Postprocedure Evaluation (Signed)
Anesthesia Post Note  Patient: Jesse [name]  Procedure(s) Performed: PERCUTANEOUS ENDOSCOPIC GASTROSTOMY (PEG) PLACEMENT     Patient location during evaluation: PACU Anesthesia Type: General Level of consciousness: awake and alert Pain management: pain level controlled Vital Signs Assessment: post-procedure vital signs reviewed and stable Respiratory status: spontaneous breathing, nonlabored ventilation, respiratory function stable and patient connected to nasal cannula oxygen Cardiovascular status: blood pressure returned to baseline and stable Postop Assessment: no apparent nausea or vomiting Anesthetic complications: no  No notable events documented.  Last Vitals:  Vitals:   07/05/22 0113 07/05/22 0643  BP: 132/81 129/85  Pulse: 97 98  Resp: 18 18  Temp: 36.6 C 36.6 C  SpO2: 99% 100%    Last Pain:  Vitals:   07/05/22 0740  TempSrc:   PainSc: 0-No pain                 Ylianna Almanzar S

## 2022-07-05 NOTE — Progress Notes (Signed)
  Progress Note   Patient: Jesse [name] [id number] DOB: [birth year] DOA: 06/05/2022     30 DOS: the patient was seen and examined on 07/05/2022 at 8:58AM      Brief hospital course: [name] is a 72 y.o. 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        Assessment and Plan: * Esophageal dysphagia See note yesterday.  PEG placed, ran at 30 overnight, 85 this morning, did well.  - Resume night time tube feeds - Continue sucralfate, PPI, mirtazapine      Acute ischemic stroke, likely embolic - Continue aspirin and Lipitor - Resume Plavix    Acute on chronic diarrhea - Continue Questran, Imodium    Essential hypertension BP controlled - Continue metoprolol   Dyslipidemia - Continue atorvastatin          Subjective: Getting better, but still very weak, only able to sit up for <1 hr today.  No fever, normal appetite.  Pain reported as expected from PEG site, no issues.     Physical Exam: BP 129/85 (BP Location: Right Arm)   Pulse 98   Temp 97.8 F (36.6 C) (Oral)   Resp 18   Ht 5\' 9"  (1.753 m)   Wt 58.3 kg   SpO2 100%   BMI 18.98 kg/m   Thin adult male, lying in bed, interactive and appropriate RRR, no murmurs, no peripheral edema Respiratory rate normal, lungs clear without rales or wheezes NG tube removed Abdomen soft without tenderness palpation, maybe some slight tenderness around the PEG tube Attentive to conversation, affect appropriate, judgment insight appear normal, severe generalized weakness, face symmetric, speech fluent    Data Reviewed: Discussed with gastroenterology No new labs    Family Communication: Wife at the bedside   Disposition: Status is: Inpatient To the best of my knowledge, the above diagnoses best describe the patient's illness, and all expected diagnostic testing that requires  acute inpatient care has been completed.  At this point, the patient is medically ready to transition to an outpatient treatment plan, but because of their severely diminished functional status from baseline, age, and new PEG tube, it is my medical opinion that discharge home at this time would pose a high risk unnecessary harm.  Safe disposition is being sought, and the patient will be ready to transition to that setting as soon as arranged.         Author: Edwin Dada, MD 07/05/2022 1:36 PM  For on call review [email or web address].

## 2022-07-05 NOTE — Hospital Course (Signed)
SNF vs Home

## 2022-07-05 NOTE — Progress Notes (Signed)
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  S/p PEG placement yesterday. Esophagus appeared normal on  EGD, no evidence for recurrent stricture or esophagitis as seen on EGD 3/4. He is tolerating tube feedings.    Severe sepsis due to Klebsiella pneumonia and bacteremia Treatment completed.    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 On Gleevac   Chronic loose stool Takes Questran   Stage 2 left lung adenocarcinoma, s/p resection and chemoradiation   Pancytopenia 2/2 to chemotherapy. Today's counts are stable   PLAN   Sounds like he may be for discharge today.    Subjective / New events:  No complaints. Tube feeds in progress.     Lab Results: Recent Labs    07/03/22 0646 07/04/22 0602  WBC 6.8 6.1  HGB 8.0* 7.9*  HCT 26.0* 25.8*  PLT 339 282   BMET Recent Labs    07/03/22 0646 07/04/22 0602  NA 137 137  K 4.2 3.8  CL 107 102  CO2 23 25  GLUCOSE 117* 99  BUN 35* 32*  CREATININE 0.65 0.63  CALCIUM 8.6* 8.9   LFT Recent Labs    07/03/22 0932  ALBUMIN 2.7*   PT/INR No results for input(s): "LABPROT", "INR" in the last 72 hours.   Scheduled inpatient medications:   aspirin  81 mg Oral Daily   atorvastatin  20 mg Oral Daily   chlorhexidine  15 mL Mouth/Throat Once   cholestyramine light  4 g Oral QID   enoxaparin (LOVENOX) injection  40 mg Subcutaneous Q24H   feeding supplement  237 mL Oral TID BM   feeding supplement (PROSource TF20)  60 mL Per Tube Daily   lidocaine  15 mL Mouth/Throat TID AC & HS   metoprolol tartrate  25 mg Oral BID   mirtazapine  7.5 mg Oral QHS   mouth rinse  15 mL Mouth Rinse 4 times per day   pantoprazole  40 mg Oral Daily   sucralfate  1 g Oral TID WC & HS   Continuous inpatient infusions:    sodium chloride 10 mL/hr at 06/15/22 2022   PRN inpatient medications: sodium chloride, acetaminophen **OR** acetaminophen, alum & mag hydroxide-simeth, HYDROcodone-acetaminophen, ipratropium-albuterol, lidocaine, liver oil-zinc oxide, loperamide, morphine injection, mouth rinse  Vital signs in last 24 hours: Temp:  [97.4 F (36.3 C)-98 F (36.7 C)] 97.8 F (36.6 C) (03/21 0643) Pulse Rate:  [78-103] 98 (03/21 0643) Resp:  [17-21] 18 (03/21 0643) BP: (108-132)/(71-85) 129/85 (03/21 0643) SpO2:  [96 %-100 %] 100 % (03/21 0643) Weight:  [58.3 kg-59.8 kg] 58.3 kg (03/21 0730) Last BM Date : 07/04/22  Intake/Output Summary (Last 24 hours) at 07/05/2022 0908 Last data filed at 07/04/2022 1432 Gross per 24 hour  Intake 600 ml  Output 200 ml  Net 400 ml    Intake/Output from previous day: 03/20 0701 - 03/21 0700 In: 600 [I.V.:600] Out: 200 [Urine:200] Intake/Output this shift: No intake/output data recorded.   Physical Exam:  General: Alert thin male in NAD Heart:  Regular rate and rhythm.  Pulmonary: Normal respiratory effort Abdomen: Soft, nondistended, nontender. Normal bowel sounds. Neurologic: Alert and oriented Psych: Pleasant. Cooperative. Insight appears normal.    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

## 2022-07-06 DIAGNOSIS — E871 Hypo-osmolality and hyponatremia: Secondary | ICD-10-CM | POA: Diagnosis not present

## 2022-07-06 DIAGNOSIS — E785 Hyperlipidemia, unspecified: Secondary | ICD-10-CM | POA: Diagnosis not present

## 2022-07-06 DIAGNOSIS — J9601 Acute respiratory failure with hypoxia: Secondary | ICD-10-CM | POA: Diagnosis not present

## 2022-07-06 DIAGNOSIS — E43 Unspecified severe protein-calorie malnutrition: Secondary | ICD-10-CM | POA: Diagnosis not present

## 2022-07-06 DIAGNOSIS — M6281 Muscle weakness (generalized): Secondary | ICD-10-CM | POA: Diagnosis not present

## 2022-07-06 DIAGNOSIS — K222 Esophageal obstruction: Secondary | ICD-10-CM | POA: Diagnosis not present

## 2022-07-06 DIAGNOSIS — Z7189 Other specified counseling: Secondary | ICD-10-CM | POA: Diagnosis not present

## 2022-07-06 DIAGNOSIS — K529 Noninfective gastroenteritis and colitis, unspecified: Secondary | ICD-10-CM | POA: Diagnosis not present

## 2022-07-06 DIAGNOSIS — C3492 Malignant neoplasm of unspecified part of left bronchus or lung: Secondary | ICD-10-CM | POA: Diagnosis not present

## 2022-07-06 DIAGNOSIS — R488 Other symbolic dysfunctions: Secondary | ICD-10-CM | POA: Diagnosis not present

## 2022-07-06 DIAGNOSIS — R1314 Dysphagia, pharyngoesophageal phase: Secondary | ICD-10-CM | POA: Diagnosis not present

## 2022-07-06 DIAGNOSIS — R2689 Other abnormalities of gait and mobility: Secondary | ICD-10-CM | POA: Diagnosis not present

## 2022-07-06 DIAGNOSIS — R278 Other lack of coordination: Secondary | ICD-10-CM | POA: Diagnosis not present

## 2022-07-06 DIAGNOSIS — Z7401 Bed confinement status: Secondary | ICD-10-CM | POA: Diagnosis not present

## 2022-07-06 DIAGNOSIS — D61810 Antineoplastic chemotherapy induced pancytopenia: Secondary | ICD-10-CM | POA: Diagnosis not present

## 2022-07-06 DIAGNOSIS — J449 Chronic obstructive pulmonary disease, unspecified: Secondary | ICD-10-CM | POA: Diagnosis not present

## 2022-07-06 DIAGNOSIS — I5022 Chronic systolic (congestive) heart failure: Secondary | ICD-10-CM | POA: Diagnosis not present

## 2022-07-06 DIAGNOSIS — I10 Essential (primary) hypertension: Secondary | ICD-10-CM | POA: Diagnosis not present

## 2022-07-06 DIAGNOSIS — I639 Cerebral infarction, unspecified: Secondary | ICD-10-CM | POA: Diagnosis not present

## 2022-07-06 DIAGNOSIS — Z931 Gastrostomy status: Secondary | ICD-10-CM | POA: Diagnosis not present

## 2022-07-06 DIAGNOSIS — R6520 Severe sepsis without septic shock: Secondary | ICD-10-CM | POA: Diagnosis not present

## 2022-07-06 DIAGNOSIS — R1319 Other dysphagia: Secondary | ICD-10-CM | POA: Diagnosis not present

## 2022-07-06 DIAGNOSIS — G9340 Encephalopathy, unspecified: Secondary | ICD-10-CM | POA: Diagnosis not present

## 2022-07-06 DIAGNOSIS — C3490 Malignant neoplasm of unspecified part of unspecified bronchus or lung: Secondary | ICD-10-CM | POA: Diagnosis not present

## 2022-07-06 DIAGNOSIS — N179 Acute kidney failure, unspecified: Secondary | ICD-10-CM | POA: Diagnosis not present

## 2022-07-06 DIAGNOSIS — A419 Sepsis, unspecified organism: Secondary | ICD-10-CM | POA: Diagnosis not present

## 2022-07-06 DIAGNOSIS — Z8673 Personal history of transient ischemic attack (TIA), and cerebral infarction without residual deficits: Secondary | ICD-10-CM | POA: Diagnosis not present

## 2022-07-06 MED ORDER — PROSOURCE TF20 ENFIT COMPATIBL EN LIQD: 60.0000 mL | Freq: Every day | ENTERAL | Status: DC

## 2022-07-06 MED ORDER — OSMOLITE 1.5 CAL PO LIQD: 1000.0000 mL | Freq: Every day | ORAL | 0 refills | Status: DC

## 2022-07-06 MED ORDER — ASPIRIN 81 MG PO CHEW: 81.0000 mg | CHEWABLE_TABLET | Freq: Every day | ORAL | Status: DC

## 2022-07-06 MED ORDER — ENSURE ENLIVE PO LIQD: 237.0000 mL | Freq: Three times a day (TID) | ORAL | 12 refills | Status: DC

## 2022-07-06 MED ORDER — METOPROLOL TARTRATE 25 MG PO TABS: 25.0000 mg | ORAL_TABLET | Freq: Two times a day (BID) | ORAL | Status: DC

## 2022-07-06 NOTE — Plan of Care (Signed)

## 2022-07-06 NOTE — TOC Transition Note (Addendum)
Transition of Care [facility]) - CM/SW Discharge Note   Patient Details  Name: Jesse [name] MRN: [id number] Date of Birth: Jul 23, 1950  Transition of Care [facility]) CM/SW Contact:  Angelita Ingles, RN Phone Number:[phone]  07/06/2022, 9:38 AM   Clinical Narrative:    CM spoke with wife [name] who feels that she can not safely transport patient to facility. CM will arrange transport via [facility]. Discharge summary/ transfer report have been faxed to [facility]. Awaiting room assignemnt.   Patient cleared to discharge to [facility] with [employer].  Please call report to  [facility] [phone]  Room # 137  Transportation has arranged . Spouse pam [place] has been updated. D/c packet is at nurses station.      Barriers to Discharge: Continued Medical Work up   Patient Goals and CMS Choice CMS Medicare.gov Compare Post Acute Care list provided to:: Patient Choice offered to / list presented to : Patient  Discharge Placement                         Discharge Plan and Services Additional resources added to the After Visit Summary for   In-house Referral: NA Discharge Planning Services: CM Consult Post Acute Care Choice: [place]          DME Arranged: N/A DME Agency: NA       HH Arranged: NA HH Agency: NA        Social Determinants of Health (SDOH) Interventions SDOH Screenings   Food Insecurity: No Food Insecurity (06/06/2022)  Housing: Low Risk  (06/06/2022)  Transportation Needs: No Transportation Needs (06/06/2022)  Utilities: Not At Risk (06/06/2022)  Depression (PHQ2-9): Low Risk  (05/07/2022)  Financial Resource Strain: Low Risk  (07/18/2021)  Physical Activity: Sufficiently Active (07/18/2021)  Social Connections: Moderately Isolated (07/12/2020)  Stress: No Stress Concern Present (07/18/2021)  Tobacco Use: Medium Risk (07/05/2022)     Readmission Risk Interventions    06/29/2022    4:48 PM 06/11/2022    5:42 PM  Readmission  Risk Prevention Plan  Transportation Screening Complete Complete  PCP or Specialist Appt within 3-5 Days  Complete  HRI or [place]  Complete  Social Work Consult for [place] Planning/Counseling  Complete  Palliative Care Screening  Not Applicable  Medication Review Press photographer) Complete Complete  PCP or Specialist appointment within 3-5 days of discharge Complete   HRI or [place] Complete   SW Recovery Care/Counseling Consult Complete   Palliative Care Screening Not [place] Complete

## 2022-07-06 NOTE — Progress Notes (Signed)
Mobility Specialist - Progress Note   07/06/22 0910  Mobility  Activity Ambulated with assistance in hallway  Level of Assistance Independent after set-up  Assistive Device Front wheel walker  Distance Ambulated (ft) 320 ft  Activity Response Tolerated well  Mobility Referral Yes  $Mobility charge 1 Mobility   Pt received in bed and agreed to mobility, with no c/o pain nor discomfort pt continued through session and ended session with all needs met back in bed.   Roderick Pee Mobility Specialist

## 2022-07-06 NOTE — Discharge Summary (Addendum)
Physician Discharge Summary   Patient: Jesse [name] MRN: [id number] DOB: 1951-01-04  Admit date:     06/05/2022  Discharge date: 07/06/22  Discharge Physician: Edwin Dada   PCP: [name], MD     Recommendations at discharge:  [place]: Continue tube feeds overnight (as outlined below) Ensure patient has follow up with dietitian and speech therapist  Follow up with Oncology Dr. 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  Found to have pneumonia, Klebsiella bacteremia and small embolic stroke   Hospitalization complicated by new reduced EF and also poor oral intake manage/radiation esophagitis requiring tube feeding, GI following.       * Sepsis with end organ damage due to Klebsiella pneumonia and bacteremia Admitted and CXR showed pneumonia.  Had encephalopathy and respiratory failure due to sepsis.  Blood cultures growing Klebsiella.  Treated and resolved.       Esophageal dysphagia Radiation  esophagitis Protein-calorie malnutrition, severe Esophageal stricture PEG tube placed given persistently low oral intake and advanced malnutrition.  Nocturnal tube feeds: Osmolite 1.5 @ 20mL/hr x16 hours (1600-0800) via PEG plus Prosource as below Give free water flush 30cc Q4hours  Continue mirtazapine Megace stopped given association with increased mortality.  Consider Zyprexa instead for appetite stimulation.   Acute ischemic stroke, likely embolic This was an incidental finding.  -Echocardiogram showed no cardiogenic source of embolism -Carotid imaging unremarkable   -Lipids ordered: discharged on atorvastatin 40 mg daily -Aspirin ordered at time of diagnosis.  Treated with 3 weeks DAPT, discharged on apsirin.   Neuro follow up arranged. -Atrial fibrillation: no evidence on telemetry over 3 weeks -tPA not given because deficits mild -Dysphagia screen ordered on diagnosis -PT eval ordered: recommended SNF -Smoking cessation: not pertinent  Chronic systolic congestive heart failure Another incidental finding during work up of stroke.  Cardiology consulted.  Started on metoprolol.  BP precluded ACE-ARNI Did not require diuretic.  Has Cardiology follow up.      Pancytopenia due to chemotherapy Anemia Stable, no clinical bleeding. Platelets and WBC normalized.  Chronic diarrhea Controlled with Imodium and Lomotil  Acute respiratory failure with hypoxia (HCC) Was hypoxic to the 80s on admission, required up to 6L O2.  Weaned to room air at discharge.  Acute metabolic encephalopathy At baseline is oriented and without cognitive impairment.  On admission was lethargic and confused.  This resolved with treatment.  Hyponatremia Mild, stable, asymptomatic  AKI (acute kidney injury) ([place]) Cr 1.8 on admission, tripled from baseline.  Treated and resolved to baseline.  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  Consultants:  Cardiology Neurology Critical Care Palliative Care Oncology Gastroenterology  Procedures performed:  EGD Echo  Disposition: Skilled nursing facility Diet recommendation: Dysphagia 3 diet    DISCHARGE MEDICATION: Allergies as of 07/06/2022   No Known Allergies      Medication List     STOP taking these medications    amLODipine 5 MG tablet Commonly known as: NORVASC   megestrol 625 MG/5ML suspension Commonly known as: MEGACE ES   omeprazole 40 MG capsule Commonly known as: PRILOSEC   OVER THE COUNTER MEDICATION   OVER THE COUNTER MEDICATION   potassium chloride SA 20 MEQ tablet Commonly known as: KLOR-CON M       TAKE these medications    acetaminophen 500 MG tablet Commonly known as: TYLENOL Take 1,000 mg by mouth every 8 (eight) hours as needed for moderate pain.   aspirin 81 MG chewable tablet Chew 1 tablet (81 mg total) by mouth daily.   atorvastatin 40 MG tablet Commonly known as: LIPITOR Take 1/2 tablet (20 mg total) by mouth daily.   cholestyramine 4 GM/DOSE powder Commonly known as: QUESTRAN Take 4 g by mouth 2 (two) times daily.   diphenoxylate-atropine 2.5-0.025 MG tablet Commonly known as: LOMOTIL Take 2 tablets by mouth 4 (four) times daily as needed for diarrhea or loose stools.   feeding supplement (PROSource TF20) liquid Place 60 mLs into feeding tube daily.   feeding supplement Liqd Take 237 mLs by mouth 3 (three) times daily between meals.   feeding supplement (OSMOLITE 1.5 CAL) Liqd Place 1,000 mLs into feeding tube daily. -Continue cyclic tube feed of 16 hours (off 8 hours during the day) -Osmolite 1.5 @ 76mL/hr x16 hours (1600-0800) via NGT -30cc H20 flush Q4hours.    ferrous sulfate 325 (65 FE) MG EC tablet Take 1 tablet (325 mg total) by mouth 2 (two) times daily.   imatinib 400 MG tablet Commonly known as: GLEEVEC Take 1 tablet (400 mg total) by mouth daily. Take with meals and large glass of water.Caution:Chemotherapy. What changed: when to take this   loperamide 2 MG tablet Commonly known as: IMODIUM A-D Take 4 mg by mouth 4 (four) times daily as needed for diarrhea or loose stools.   metoprolol tartrate 25 MG tablet Commonly known as: LOPRESSOR Take 1 tablet (25 mg total) by mouth 2 (two) times daily.   mirtazapine 15 MG tablet Commonly known as: Remeron Take 1 tablet (15 mg total) by mouth at bedtime.   ondansetron 4 MG tablet Commonly known as: ZOFRAN Take 4 mg by mouth every 8 (eight) hours as needed for nausea or vomiting.   polycarbophil 625 MG tablet Commonly known as: FIBERCON Take 2,500 mg by mouth daily. Taking 4 tabs ( 625 mg each)  daily   PreserVision AREDS 2 Caps Take 1 capsule by mouth 2 (two) times daily.   prochlorperazine 10 MG tablet Commonly known as: COMPAZINE Take 1 tablet (10 mg total) by mouth every 6 (six) hours as needed for nausea or vomiting.   sucralfate 1 g tablet Commonly known as: Carafate Take 1 tablet by mouth 4 times daily -  with meals and at bedtime.   VITAMIN D3  PO Take 2,000 Units by mouth daily.               Discharge Care Instructions  (From admission, onward)           Start     Ordered   07/06/22 0000  Discharge wound care:       Comments: Change dressing around PEG tube once weekly   07/06/22 [id number]            Follow-up Information     Marylu Lund., NP Follow up on 07/27/2022.   Specialty: Cardiology Why: 9:15AM. Cardiology follow up Contact information: [address] [place] [address] [place] [address] [phone]                 Discharge Instructions     Amb Referral to Palliative Care   Complete by: As directed    Ambulatory  referral to Neurology   Complete by: As directed    An appointment is requested in approximately: 8 weeks For stroke follow up   Discharge instructions   Complete by: As directed    **IMPORTANT DISCHARGE INSTRUCTIONS**   From Dr. Loleta Books: You were admitted for weakness and lethargy Here, we found that this was from a disseminated bacterial infection in your blood stream This was with a bacteria called Klebsiella, that probably came from a pneumonia  You were treated with antibiotics and this resolved  You were also found (and this was probably an accidental, incidental finding, and not symptomatic) a small stroke  For this stroke, you were treated with 3 weeks of combination aspirin and Plavix Going forward, two things that will likely reduce your chances of another stroke is: START aspirin 81 mg low dose daily from now on CONTINUE your atorvastatin   You were also found (incidentally) to have reduced heart squeeze. You were evaluated by Cardiology, and they will follow up with you in the office on Apr 12 (see below) You should CHANGE your amlodipine blood pressure medicine to metoprolol    Work with speech therapy to continue improving the quantity of food you take by mouth and ALSO expanding the types of foods you can swallow comfortably  Work with a dietitian to make sure you are getting enough calories at night and gaining weight  Go see Dr. Julien Nordmann on Apr 9 Go see Dr. Sondra Come on Apr 15   Discharge wound care:   Complete by: As directed    Change dressing around PEG tube once daily   Increase activity slowly   Complete by: As directed        Discharge Exam: Filed Weights   07/04/22 0701 07/04/22 1247 07/05/22 0730  Weight: 59.8 kg 59.8 kg 58.3 kg    General: Pt is alert, awake, not in acute distress, very thin Cardiovascular: HR faster but regular, nl S1-S2, no murmurs appreciated.   No LE edema.   Respiratory: Normal respiratory rate and rhythm.  CTAB without  rales or wheezes. Abdominal: Abdomen soft and non-tender.  No distension or HSM.   Neuro/Psych: Strength symmetric in upper and lower extremities.  Judgment and insight appear normal.   Condition at discharge: good  The results of significant diagnostics from this hospitalization (including imaging, microbiology, ancillary and laboratory) are listed below for reference.   Imaging Studies: IR Fluoro Rm 30-60 Min  Result Date: 07/02/2022 CLINICAL DATA:  72 year old with severe malnutrition and evaluation for percutaneous gastrostomy tube placement EXAM: IR FLUORO RM 0-60 MIN ANESTHESIA/SEDATION: None MEDICATIONS:  None CONTRAST:  None FLUOROSCOPY: Radiation Exposure Index (as provided by the fluoroscopic device): 13 mGy Kerma PROCEDURE: Patient was placed supine on the interventional table. Air was injected through the patient's existing nasogastric tube. Fluoroscopy was used to look for a safe percutaneous window for tube placement. A percutaneous window could not be identified. Therefore, a gastrostomy tube procedure was not performed. COMPLICATIONS: None immediate FINDINGS: Nasogastric tube tip in the distal stomach body region. Large amount of bowel in the left upper abdomen. Based on the lateral view, this bowel is anterior to the stomach and there is not a safe percutaneous window for gastrostomy tube placement. IMPRESSION: No safe percutaneous window for gastrostomy tube placement. Therefore, gastrostomy tube placement was not attempted. Electronically Signed   By: Markus Daft M.D.   On: 07/02/2022 17:33   DG Abd 1 View  Result Date: 07/01/2022 CLINICAL DATA:  Feeding tube placement. EXAM: ABDOMEN - 1 VIEW COMPARISON:  06/30/2022. FINDINGS: The bowel gas pattern is normal. A feeding tube terminates in the gastric fundus. No radio-opaque calculi or other acute radiographic abnormality are seen. IMPRESSION: Feeding tube terminates in the gastric fundus. Electronically Signed   By: Brett Fairy M.D.    On: 07/01/2022 01:00   DG Abd 1 View  Result Date: 06/30/2022 CLINICAL DATA:  Feeding tube placement EXAM: ABDOMEN - 1 VIEW COMPARISON:  06/20/2022 FINDINGS: Feeding tube has been withdrawn, weighted tip overlies the upper esophagus on 1 of the views. Nonobstructed gas pattern. IMPRESSION: Feeding tube tip overlies the upper esophagus on 1 of the views. These results will be called to the ordering clinician or representative by the Radiologist Assistant, and communication documented in the PACS or [employer]. Electronically Signed   By: Donavan Foil M.D.   On: 06/30/2022 23:22   CT ABDOMEN WO CONTRAST  Result Date: 06/27/2022 CLINICAL DATA:  anatomy eval for possible G tube placement EXAM: CT ABDOMEN WITHOUT CONTRAST TECHNIQUE: Multidetector CT imaging of the abdomen was performed following the standard protocol without IV contrast. RADIATION DOSE REDUCTION: This exam was performed according to the departmental dose-optimization program which includes automated exposure control, adjustment of the mA and/or kV according to patient size and/or use of iterative reconstruction technique. COMPARISON:  CT AP, 04/26/2022 and 03/24/2021 FINDINGS: Lower chest: Calcified coronary atherosclerosis. 1.7 cm peripheral RIGHT basilar pulmonary cyst. Interval development of bibasilar dependent tree-in-bud nodularities. Hepatobiliary: Normal noncontrast appearance of the liver without focal abnormality. No gallstones, gallbladder wall thickening, or biliary dilatation. Pancreas: No pancreatic ductal dilatation or surrounding inflammatory changes. Spleen: Normal in size without focal abnormality. Adrenals/Urinary Tract: Adrenal glands are unremarkable. Trace bilateral perirenal stranding. Bilateral punctate nonobstructing nephrolithiasis. No hydronephrosis. Stomach/Bowel: Small bore enteric feeding tube, with tip within the antral stomach. Stomach mildly distended and is within normal limits. Mild prominence of air  distended small bowel loop medial to the stomach. Distal small bowel anastomosis, incompletely assessed on this evaluation. No bowel obstruction. Vascular/Lymphatic: Moderate-to-severe burden of aortic atherosclerosis without aneurysmal dilatation. No enlarged abdominal lymph nodes. Other: Scaphoid abdomen.  No abdominal wall hernia or abnormality. Musculoskeletal: Multilevel degenerative changes of imaged thoracolumbar spine. No acute osseous findings. IMPRESSION: 1. New, bibasilar tree-in-bud pulmonary nodularities, suspicious for multifocal pneumonia. 2. Anatomy is potentially amenable for percutaneous gastrostomy placement, in consideration of mildly prominent air distended bowel loop medial to the stomach. Final decision making per performing clinician. Consider preprocedural PO contrast opacification of bowel and colon for better visualization. 3. Gastric placement of enteric feeding tube. 4. Nonobstructing nephrolithiasis  and aortic Atherosclerosis (ICD10-I70.0). Additional incidental, chronic and senescent findings as above. Michaelle Birks, MD [facility] Radiology Electronically Signed   By: Michaelle Birks M.D.   On: 06/27/2022 10:28   DG Abd 1 View  Result Date: 06/20/2022 CLINICAL DATA:  Feeding tube placement EXAM: ABDOMEN - 1 VIEW COMPARISON:  06/18/2022 FINDINGS: The feeding tube tip is in the stomach antrum, oriented towards the pylorus. The lung bases appear clear. Dextroconvex lumbar scoliosis with rotary component. Thoracolumbar spondylosis. IMPRESSION: 1. Feeding tube tip is in the stomach antrum, oriented towards the pylorus. Electronically Signed   By: Van Clines M.D.   On: 06/20/2022 15:30   DG Abd Portable 1V  Result Date: 06/18/2022 CLINICAL DATA:  Encounter for nasogastric tube placement. EXAM: PORTABLE ABDOMEN - 1 VIEW COMPARISON:  06/16/2022 FINDINGS: Single view of the abdomen demonstrates a feeding tube that extends down the  esophagus and terminates in the left upper abdomen. The tip of the feeding tube is likely in the gastric fundus or body region. Bowel gas in the left upper quadrant of the abdomen. IMPRESSION: Feeding tube tip is likely in the gastric fundus or body region. Electronically Signed   By: Markus Daft M.D.   On: 06/18/2022 14:40   DG Abd 1 View  Result Date: 06/16/2022 CLINICAL DATA:  Check gastric catheter placement EXAM: ABDOMEN - 1 VIEW COMPARISON:  06/15/2022 FINDINGS: Weighted feeding catheter is noted coiled within the stomach. No free air is seen. No obstructive changes are noted. IMPRESSION: Weighted feeding catheter in the stomach. Electronically Signed   By: Inez Catalina M.D.   On: 06/16/2022 23:01   DG Abd 1 View  Result Date: 06/15/2022 CLINICAL DATA:  NG tube placement EXAM: ABDOMEN - 1 VIEW COMPARISON:  Abdominal x-ray 06/15/2022 FINDINGS: Nasogastric tube tip is in the proximal stomach just beyond the gastroesophageal junction. No dilated bowel loops are seen. IMPRESSION: Nasogastric tube tip is in the proximal stomach just beyond the gastroesophageal junction. Recommend advancing tube. Electronically Signed   By: Ronney Asters M.D.   On: 06/15/2022 15:07   DG Abd 1 View  Result Date: 06/15/2022 CLINICAL DATA:  NG tube placement. EXAM: ABDOMEN - 1 VIEW COMPARISON:  06/14/2022. FINDINGS: The bowel gas pattern is normal. The distal tip of the feeding tube terminates in the anticipated region of the gastric antrum. Mild airspace disease is noted at the right lung base. No radio-opaque calculi or other significant radiographic abnormality are seen. IMPRESSION: Enteric tube terminates in the anticipated region of the gastric antrum. Electronically Signed   By: Brett Fairy M.D.   On: 06/15/2022 04:41   DG Abd 1 View  Result Date: 06/15/2022 CLINICAL DATA:  Nasogastric tube placement EXAM: ABDOMEN - 1 VIEW COMPARISON:  None Available. FINDINGS: Nasoenteric feeding tube tip overlies the gastric  fundus. Nonobstructive bowel gas pattern. No free intraperitoneal gas. Pelvis excluded from view. IMPRESSION: 1. Nasoenteric feeding tube tip overlies the gastric fundus. Electronically Signed   By: Fidela Salisbury M.D.   On: 06/15/2022 00:00   VAS [place] LOWER EXTREMITY VENOUS (DVT)  Result Date: 06/12/2022  Lower Venous DVT Study Patient Name:  CEDERICK NEWMANN  Date of Exam:   06/12/2022 Medical Rec #: [id number]        Accession #:    [id number] Date of Birth: September 18, 1950        Patient Gender: M Patient Age:   7 years Exam Location:  [facility] Procedure:  VAS [place] LOWER EXTREMITY VENOUS (DVT) Referring Phys: COLLEEN STACK --------------------------------------------------------------------------------  Indications: Stroke.  Risk Factors: None identified. Comparison Study: No prior studies. Performing Technologist: Oliver Hum RVT  Examination Guidelines: A complete evaluation includes B-mode imaging, spectral Doppler, color Doppler, and power Doppler as needed of all accessible portions of each vessel. Bilateral testing is considered an integral part of a complete examination. Limited examinations for reoccurring indications may be performed as noted. The reflux portion of the exam is performed with the patient in reverse Trendelenburg.  +---------+---------------+---------+-----------+----------+--------------+ RIGHT    CompressibilityPhasicitySpontaneityPropertiesThrombus Aging +---------+---------------+---------+-----------+----------+--------------+ CFV      Full           Yes      Yes                                 +---------+---------------+---------+-----------+----------+--------------+ SFJ      Full                                                        +---------+---------------+---------+-----------+----------+--------------+ FV Prox  Full                                                         +---------+---------------+---------+-----------+----------+--------------+ FV Mid   Full                                                        +---------+---------------+---------+-----------+----------+--------------+ FV DistalFull           Yes      Yes                                 +---------+---------------+---------+-----------+----------+--------------+ PFV      Full                                                        +---------+---------------+---------+-----------+----------+--------------+ POP      Full           Yes      Yes                                 +---------+---------------+---------+-----------+----------+--------------+ PTV      Full                                                        +---------+---------------+---------+-----------+----------+--------------+ PERO     Full                                                        +---------+---------------+---------+-----------+----------+--------------+   +---------+---------------+---------+-----------+----------+--------------+  LEFT     CompressibilityPhasicitySpontaneityPropertiesThrombus Aging +---------+---------------+---------+-----------+----------+--------------+ CFV      Full           Yes      Yes                                 +---------+---------------+---------+-----------+----------+--------------+ SFJ      Full                                                        +---------+---------------+---------+-----------+----------+--------------+ FV Prox  Full                                                        +---------+---------------+---------+-----------+----------+--------------+ FV Mid   Full                                                        +---------+---------------+---------+-----------+----------+--------------+ FV DistalFull                                                         +---------+---------------+---------+-----------+----------+--------------+ PFV      Full                                                        +---------+---------------+---------+-----------+----------+--------------+ POP      Full           Yes      Yes                                 +---------+---------------+---------+-----------+----------+--------------+ PTV      Full                                                        +---------+---------------+---------+-----------+----------+--------------+ PERO     Full                                                        +---------+---------------+---------+-----------+----------+--------------+     Summary: RIGHT: - There is no evidence of deep vein thrombosis in the lower extremity.  - No cystic structure found in the popliteal fossa.  LEFT: - There is no evidence of deep vein thrombosis in the lower extremity.  - No  cystic structure found in the popliteal fossa.  *See table(s) above for measurements and observations. Electronically signed by Deitra Mayo MD on 06/12/2022 at 7:34:33 PM.    Final    ECHOCARDIOGRAM COMPLETE  Result Date: 06/12/2022    ECHOCARDIOGRAM REPORT   Patient Name:   LANGFORD TYGART Date of Exam: 06/12/2022 Medical Rec #:  [id number]       Height:       69.0 in Accession #:    [id number]      Weight:       145.9 lb Date of Birth:  05/23/1950       BSA:          1.807 m Patient Age:    11 years        BP:           141/96 mmHg Patient Gender: M               HR:           106 bpm. Exam Location:  Inpatient Procedure: 2D Echo, Cardiac Doppler, Color Doppler and Intracardiac            Opacification Agent Indications:    Stroke  History:        Patient has no prior history of Echocardiogram examinations.                 Signs/Symptoms:Bacteremia; Risk Factors:Hypertension and                 Dyslipidemia. Lung cancer. Chemo.  Sonographer:    Roseanna Rainbow RDCS Referring Phys: [id number] AMRIT ADHIKARI  Sonographer  Comments: Technically difficult study due to poor echo windows, Technically challenging study due to limited acoustic windows, suboptimal parasternal window, suboptimal apical window and suboptimal subcostal window. Extremely difficult study. IMPRESSIONS  1. Left ventricular ejection fraction, by estimation, is 40 to 45%. The left ventricle has mildly decreased function. The left ventricle demonstrates global hypokinesis. The left ventricular internal cavity size was mildly dilated. Indeterminate diastolic filling due to E-A fusion.  2. Right ventricular systolic function is normal. The right ventricular size is normal. Tricuspid regurgitation signal is inadequate for assessing PA pressure.  3. The mitral valve is grossly normal. No evidence of mitral valve regurgitation.  4. The aortic valve is grossly normal. Aortic valve regurgitation is not visualized. No aortic stenosis is present.  5. The inferior vena cava is normal in size with greater than 50% respiratory variability, suggesting right atrial pressure of 3 mmHg. Conclusion(s)/Recommendation(s): No evidence of valvular vegetations on this transthoracic echocardiogram. Consider a transesophageal echocardiogram to exclude infective endocarditis if clinically indicated. FINDINGS  Left Ventricle: No left ventricular thrombus is seen (Definity contrast used). Left ventricular ejection fraction, by estimation, is 40 to 45%. The left ventricle has mildly decreased function. The left ventricle demonstrates global hypokinesis. Definity contrast agent was given IV to delineate the left ventricular endocardial borders. The left ventricular internal cavity size was mildly dilated. There is no left ventricular hypertrophy. Indeterminate diastolic filling due to E-A fusion. Right Ventricle: The right ventricular size is normal. No increase in right ventricular wall thickness. Right ventricular systolic function is normal. Tricuspid regurgitation signal is inadequate for  assessing PA pressure. Left Atrium: Left atrial size was normal in size. Right Atrium: Right atrial size was normal in size. Pericardium: There is no evidence of pericardial effusion. Mitral Valve: The mitral valve is grossly normal. Mild mitral annular calcification. No evidence of mitral valve regurgitation. Tricuspid  Valve: The tricuspid valve is normal in structure. Tricuspid valve regurgitation is not demonstrated. Aortic Valve: The aortic valve is grossly normal. Aortic valve regurgitation is not visualized. No aortic stenosis is present. Pulmonic Valve: The pulmonic valve was not well visualized. Aorta: The aortic root is normal in size and structure. Venous: The inferior vena cava is normal in size with greater than 50% respiratory variability, suggesting right atrial pressure of 3 mmHg. IAS/Shunts: The interatrial septum was not well visualized.  LEFT VENTRICLE PLAX 2D LVIDd:         5.70 cm      Diastology LVIDs:         4.50 cm      LV e' medial:    5.66 cm/s LV PW:         1.00 cm      LV E/e' medial:  9.5 LV IVS:        1.10 cm      LV e' lateral:   10.00 cm/s LVOT diam:     2.50 cm      LV E/e' lateral: 5.4 LV SV:         54 LV SV Index:   30 LVOT Area:     4.91 cm  LV Volumes (MOD) LV vol d, MOD A2C: 99.6 ml LV vol d, MOD A4C: 122.0 ml LV vol s, MOD A2C: 59.5 ml LV vol s, MOD A4C: 70.9 ml LV SV MOD A2C:     40.1 ml LV SV MOD A4C:     122.0 ml LV SV MOD BP:      46.6 ml RIGHT VENTRICLE             IVC RV S prime:     12.00 cm/s  IVC diam: 1.80 cm TAPSE (M-mode): 2.1 cm LEFT ATRIUM             Index        RIGHT ATRIUM           Index LA diam:        3.10 cm 1.72 cm/m   RA Area:     10.70 cm LA Vol (A2C):   37.0 ml 20.48 ml/m  RA Volume:   21.20 ml  11.73 ml/m LA Vol (A4C):   33.6 ml 18.59 ml/m LA Biplane Vol: 36.7 ml 20.31 ml/m  AORTIC VALVE LVOT Vmax:   83.10 cm/s LVOT Vmean:  54.700 cm/s LVOT VTI:    0.109 m  AORTA Ao Root diam: 3.70 cm MITRAL VALVE MV Area (PHT): 6.32 cm    SHUNTS MV Decel  Time: 120 msec    Systemic VTI:  0.11 m MV E velocity: 54.00 cm/s  Systemic Diam: 2.50 cm MV A velocity: 90.80 cm/s MV E/A ratio:  0.59 Mihai Croitoru MD Electronically signed by Sanda Klein MD Signature Date/Time: 06/12/2022/10:54:52 AM    Final    CT ANGIO HEAD NECK W WO CM  Result Date: 06/12/2022 CLINICAL DATA:  Follow-up examination for stroke. EXAM: CT ANGIOGRAPHY HEAD AND NECK TECHNIQUE: Multidetector CT imaging of the head and neck was performed using the standard protocol during bolus administration of intravenous contrast. Multiplanar CT image reconstructions and MIPs were obtained to evaluate the vascular anatomy. Carotid stenosis measurements (when applicable) are obtained utilizing NASCET criteria, using the distal internal carotid diameter as the denominator. RADIATION DOSE REDUCTION: This exam was performed according to the departmental dose-optimization program which includes automated exposure control, adjustment of the mA and/or kV according to  patient size and/or use of iterative reconstruction technique. CONTRAST:  70mL OMNIPAQUE IOHEXOL 350 MG/ML SOLN COMPARISON:  Prior MRI from earlier the same day. FINDINGS: CT HEAD FINDINGS Brain: Age-related cerebral atrophy. Previously identified small ischemic infarcts not visible by CT. No other acute large vessel territory infarct. No acute intracranial hemorrhage. No mass lesion or midline shift. No hydrocephalus or extra-axial fluid collection. Vascular: No abnormal hyperdense vessel. Scattered vascular calcifications noted within the carotid siphons. Skull: Scalp soft tissues and calvarium demonstrate no acute finding. Sinuses/Orbits: Globes orbital soft tissues within normal limits. Paranasal sinuses are clear. No mastoid effusion. Other: None. Review of the MIP images confirms the above findings CTA NECK FINDINGS Aortic arch: Visualized aortic arch within normal limits for caliber with standard branch pattern. Atheromatous change about the  origin of the great vessels without hemodynamically significant stenosis. Right carotid system: Right common and internal carotid arteries are patent without dissection. Moderate atheromatous change about the right carotid bulb without hemodynamically significant greater than 50% stenosis. Left carotid system: Left common and internal carotid arteries are patent without dissection. Moderate atheromatous change about the left carotid bulb without hemodynamically significant greater than 50% stenosis. Vertebral arteries: Both vertebral arteries arise from the subclavian arteries. No proximal subclavian artery stenosis. Right vertebral artery dominant. Vertebral arteries patent without dissection or hemodynamically significant stenosis. Skeleton: No worrisome osseous lesions. Moderate degenerative spondylosis noted at C3-4 through C6-7. Other neck: No other acute soft tissue abnormality within the neck. Nasogastric tube in place. 12 mm left thyroid nodule, of doubtful significance given size and patient age, no follow-up imaging recommended (ref: J Am Coll Radiol. 2015 Feb;12(2): 143-50). Upper chest: Emphysema noted. Parenchymal opacities within the visualized right upper and lower lobes, suspicious for pneumonia. Layering bilateral pleural effusions, right greater than left. Few small pulmonary nodules measuring up to 7 mm at the right lung apex, stable from prior. Review of the MIP images confirms the above findings CTA HEAD FINDINGS Anterior circulation: Both internal carotid arteries are patent to the termini without stenosis. Tiny 2 mm outpouching extending laterally from the proximal cavernous left ICA, suspicious for a small aneurysm (series 8, image 109). A1 segments patent bilaterally. Normal anterior communicating artery complex. Both anterior cerebral arteries widely patent without stenosis. No M1 stenosis or occlusion. No proximal MCA branch occlusion or high-grade stenosis. Distal MCA branches perfused  and symmetric. Posterior circulation: Both V4 segments are widely patent. Both PICA patent. Basilar patent without stenosis superior cerebellar arteries patent bilaterally. Left PCA supplied via the basilar. Right PCA supplied via a hypoplastic right P1 segment and robust right posterior communicating artery. Both PCAs patent to their distal aspects without stenosis. Venous sinuses: Patent allowing for timing the contrast bolus. Anatomic variants: As above. Review of the MIP images confirms the above findings IMPRESSION: CT HEAD IMPRESSION: No acute intracranial abnormality. Previously identified small ischemic infarcts not visible by CT. CTA HEAD AND NECK IMPRESSION: 1. Negative CTA for large vessel occlusion or other emergent finding. 2. Moderate atheromatous change about the carotid bifurcations without hemodynamically significant greater than 50% stenosis. 3. 2 mm outpouching extending laterally from the proximal cavernous left ICA, suspicious for a small aneurysm. 4. Parenchymal opacities within the partially visualized right lung, suspicious for pneumonia. 5. Layering bilateral pleural effusions, right greater than left. 6. Few small pulmonary nodules measuring up to 7 mm at the right lung apex, stable from prior. Continued surveillance recommended. Aortic Atherosclerosis (ICD10-I70.0) and Emphysema (ICD10-J43.9). Electronically Signed   By: Jeannine Boga  M.D.   On: 06/12/2022 05:47   MR BRAIN W WO CONTRAST  Result Date: 06/11/2022 CLINICAL DATA:  History of lung cancer, presents with fatigue, confusion, unresponsiveness. EXAM: MRI HEAD WITHOUT AND WITH CONTRAST TECHNIQUE: Multiplanar, multiecho pulse sequences of the brain and surrounding structures were obtained without and with intravenous contrast. CONTRAST:  6.79mL GADAVIST GADOBUTROL 1 MMOL/ML IV SOLN COMPARISON:  CT head 06/07/2022, brain MRI 01/04/2020 FINDINGS: Brain: There are small foci of diffusion restriction in the left frontal cortex  (5-37, 5-28) and white matter (5-34), right occipital cortex (5-19), and bilateral cerebellar hemispheres (5-12, 5-10). There is associated FLAIR signal abnormality involving the focus in the left centrum semiovale. There is no associated enhancement. Findings are most in keeping with small acute infarcts, possibly embolic. There is no hemorrhage or mass effect. There is no acute intracranial hemorrhage or extra-axial fluid collection Parenchymal volume is normal. The ventricles are normal in size. Parenchymal signal is otherwise normal, with no significant burden of underlying white matter microangiopathic change. There is no mass lesion or abnormal enhancement. There is no mass effect or midline shift. Vascular: Normal flow voids. Skull and upper cervical spine: Normal marrow signal. Sinuses/Orbits: The paranasal sinuses are clear. The globes and orbits are unremarkable. Other: There is subcentimeter probable sebaceous cyst in the left cheek. The pituitary and suprasellar region are normal. IMPRESSION: 1. 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 2. No evidence of intracranial metastatic disease. Electronically Signed   By: Valetta Mole M.D.   On: 06/11/2022 12:40   DG Abd 1 View  Result Date: 06/10/2022 CLINICAL DATA:  Nasogastric tube placement EXAM: ABDOMEN - 1 VIEW COMPARISON:  06/07/2022 FINDINGS: Nasoenteric feeding tube tip is seen overlying the expected distal body of the stomach. Normal abdominal gas pattern. No organomegaly. Pelvis excluded from view. Lumbar dextroscoliosis noted with superimposed degenerative change. IMPRESSION: 1. Nasoenteric feeding tube tip within the distal body of the stomach. Electronically Signed   By: Fidela Salisbury M.D.   On: 06/10/2022 22:37   CT HEAD WO CONTRAST (5MM)  Result Date: 06/07/2022 CLINICAL DATA:  Altered mental status.  Brain Mets suspected. EXAM:  CT HEAD WITHOUT CONTRAST TECHNIQUE: Contiguous axial images were obtained from the base of the skull through the vertex without intravenous contrast. RADIATION DOSE REDUCTION: This exam was performed according to the departmental dose-optimization program which includes automated exposure control, adjustment of the mA and/or kV according to patient size and/or use of iterative reconstruction technique. COMPARISON:  MRI Brain 01/12/20 FINDINGS: Brain: No evidence of acute infarction, hemorrhage, hydrocephalus, extra-axial collection or mass lesion/mass effect. Note the lack of IV contrast and CT technique markedly limits the ability to identify intracranial metastatic lesions. Vascular: No hyperdense vessel or unexpected calcification. Skull: Normal. Negative for fracture or focal lesion. Sinuses/Orbits: No middle ear or mastoid effusion. Paranasal sinuses are clear. Orbits are unremarkable. Other: None. IMPRESSION: 1. No acute intracranial abnormality. 2. Note the lack of IV contrast and CT technique markedly limits the ability to identify intracranial metastatic lesions. Consider follow-up brain MRI with and without contrast if there is high clinical concern for intracranial metastatic disease. Electronically Signed   By: Marin Roberts M.D.   On: 06/07/2022 18:06   DG Abd 1 View  Result Date: 06/07/2022 CLINICAL DATA:  Check gastric catheter placement EXAM: ABDOMEN - 1 VIEW COMPARISON:  None Available. FINDINGS: Weighted feeding catheter is noted in the distal stomach.  Scattered large and small bowel gas is noted. No free air is seen. IMPRESSION: Weighted feeding catheter in the distal stomach. Electronically Signed   By: Inez Catalina M.D.   On: 06/07/2022 17:16   DG Abd 1 View  Result Date: 06/07/2022 CLINICAL DATA:  Nasogastric tube placement. EXAM: ABDOMEN - 1 VIEW COMPARISON:  None Available. FINDINGS: Tip of the weighted enteric tube below the diaphragm in the left upper quadrant. This is in the region  of the gastric cardia and directed cranially. There is mild gaseous distension of small and large bowel in the abdomen. IMPRESSION: Tip of the weighted enteric tube in the left upper quadrant in the region of the gastric cardia directed cranially. Electronically Signed   By: Keith Rake M.D.   On: 06/07/2022 13:25   DG Chest Port 1 View  Result Date: 06/07/2022 CLINICAL DATA:  Shortness of breath EXAM: PORTABLE CHEST 1 VIEW COMPARISON:  06/05/2022 FINDINGS: Cardiac shadow is stable. Left lung is clear. Increasing right basilar infiltrate is noted. Some generalized increased density in the right hemithorax is seen which may be related to posterior effusion. No bony abnormality is seen. IMPRESSION: Increasing right basilar infiltrate and likely posterior layering effusion. Electronically Signed   By: Inez Catalina M.D.   On: 06/07/2022 01:56    Microbiology: Results for orders placed or performed during the hospital encounter of 06/05/22  Resp panel by RT-PCR (RSV, Flu A&B, Covid) Anterior Nasal Swab     Status: None   Collection Time: 06/05/22  6:20 PM   Specimen: Anterior Nasal Swab  Result Value Ref Range Status   SARS Coronavirus 2 by RT PCR NEGATIVE NEGATIVE Final    Comment: (NOTE) SARS-CoV-2 target nucleic acids are NOT DETECTED.  The SARS-CoV-2 RNA is generally detectable in upper respiratory specimens during the acute phase of infection. The lowest concentration of SARS-CoV-2 viral copies this assay can detect is 138 copies/mL. A negative result does not preclude SARS-Cov-2 infection and should not be used as the sole basis for treatment or other patient management decisions. A negative result may occur with  improper specimen collection/handling, submission of specimen other than nasopharyngeal swab, presence of viral mutation(s) within the areas targeted by this assay, and inadequate number of viral copies(<138 copies/mL). A negative result must be combined with clinical  observations, patient history, and epidemiological information. The expected result is Negative.  Fact Sheet for Patients:  EntrepreneurPulse.com.au  Fact Sheet for Healthcare Providers:  IncredibleEmployment.be  This test is no t yet approved or cleared by the [place] FDA and  has been authorized for detection and/or diagnosis of SARS-CoV-2 by FDA under an Emergency Use Authorization (EUA). This EUA will remain  in effect (meaning this test can be used) for the duration of the COVID-19 declaration under Section 564(b)(1) of the Act, 21 U.S.C.section 360bbb-3(b)(1), unless the authorization is terminated  or revoked sooner.       Influenza A by PCR NEGATIVE NEGATIVE Final   Influenza B by PCR NEGATIVE NEGATIVE Final    Comment: (NOTE) The Xpert Xpress SARS-CoV-2/FLU/RSV plus assay is intended as an aid in the diagnosis of influenza from Nasopharyngeal swab specimens and should not be used as a sole basis for treatment. Nasal washings and aspirates are unacceptable for Xpert Xpress SARS-CoV-2/FLU/RSV testing.  Fact Sheet for Patients: EntrepreneurPulse.com.au  Fact Sheet for Healthcare Providers: IncredibleEmployment.be  This test is not yet approved or cleared by the [place] FDA and has been authorized for detection and/or diagnosis  of SARS-CoV-2 by FDA under an Emergency Use Authorization (EUA). This EUA will remain in effect (meaning this test can be used) for the duration of the COVID-19 declaration under Section 564(b)(1) of the Act, 21 U.S.C. section 360bbb-3(b)(1), unless the authorization is terminated or revoked.     Resp Syncytial Virus by PCR NEGATIVE NEGATIVE Final    Comment: (NOTE) Fact Sheet for Patients: EntrepreneurPulse.com.au  Fact Sheet for Healthcare Providers: IncredibleEmployment.be  This test is not yet approved or cleared by  the [place] FDA and has been authorized for detection and/or diagnosis of SARS-CoV-2 by FDA under an Emergency Use Authorization (EUA). This EUA will remain in effect (meaning this test can be used) for the duration of the COVID-19 declaration under Section 564(b)(1) of the Act, 21 U.S.C. section 360bbb-3(b)(1), unless the authorization is terminated or revoked.  Performed at [facility], [place] [address], [place], [place] [address]   Blood culture (routine x 2)     Status: Abnormal   Collection Time: 06/05/22  6:46 PM   Specimen: BLOOD  Result Value Ref Range Status   Specimen Description   Final    BLOOD BLOOD RIGHT ARM Performed at [facility] [address], [place], [place] [address]    Special Requests   Final    BOTTLES DRAWN AEROBIC AND ANAEROBIC Blood Culture adequate volume Performed at [facility] [address] [place], [place] [address]    Culture  Setup Time   Final    GRAM NEGATIVE RODS IN BOTH AEROBIC AND ANAEROBIC BOTTLES CRITICAL RESULT CALLED TO, READ BACK BY AND VERIFIED WITH: PHARMD M.BELL AT 1019 ON 06/06/2022 BY T.SAAD. Performed at [facility], [place] [address] [place], [place] [address]    Culture KLEBSIELLA PNEUMONIAE (A)  Final   Report Status 06/08/2022 FINAL  Final   Organism ID, Bacteria KLEBSIELLA PNEUMONIAE  Final      Susceptibility   Klebsiella pneumoniae - MIC*    AMPICILLIN RESISTANT Resistant     CEFEPIME <=0.12 SENSITIVE Sensitive     CEFTAZIDIME <=1 SENSITIVE Sensitive     CEFTRIAXONE <=0.25 SENSITIVE Sensitive     CIPROFLOXACIN <=0.25 SENSITIVE Sensitive     GENTAMICIN <=1 SENSITIVE Sensitive     IMIPENEM <=0.25 SENSITIVE Sensitive     TRIMETH/SULFA <=20 SENSITIVE Sensitive     AMPICILLIN/SULBACTAM 4 SENSITIVE Sensitive     PIP/TAZO <=4 SENSITIVE Sensitive     * KLEBSIELLA PNEUMONIAE  Blood Culture ID Panel (Reflexed)     Status: Abnormal   Collection Time:  06/05/22  6:46 PM  Result Value Ref Range Status   Enterococcus faecalis NOT DETECTED NOT DETECTED Final   Enterococcus Faecium NOT DETECTED NOT DETECTED Final   Listeria monocytogenes NOT DETECTED NOT DETECTED Final   Staphylococcus species NOT DETECTED NOT DETECTED Final   Staphylococcus aureus (BCID) NOT DETECTED NOT DETECTED Final   Staphylococcus epidermidis NOT DETECTED NOT DETECTED Final   Staphylococcus lugdunensis NOT DETECTED NOT DETECTED Final   Streptococcus species NOT DETECTED NOT DETECTED Final   Streptococcus agalactiae NOT DETECTED NOT DETECTED Final   Streptococcus pneumoniae NOT DETECTED NOT DETECTED Final   Streptococcus pyogenes NOT DETECTED NOT DETECTED Final   A.calcoaceticus-baumannii NOT DETECTED NOT DETECTED Final   Bacteroides fragilis NOT DETECTED NOT DETECTED Final   Enterobacterales DETECTED (A) NOT DETECTED Final    Comment: Enterobacterales represent a large order of gram negative bacteria, not a single organism. CRITICAL RESULT CALLED TO, READ BACK  BY AND VERIFIED WITH: PHARMD M.BELL AT 1019 ON 06/06/2022 BY T.SAAD.    Enterobacter cloacae complex NOT DETECTED NOT DETECTED Final   Escherichia coli NOT DETECTED NOT DETECTED Final   Klebsiella aerogenes NOT DETECTED NOT DETECTED Final   Klebsiella oxytoca NOT DETECTED NOT DETECTED Final   Klebsiella pneumoniae DETECTED (A) NOT DETECTED Final    Comment: CRITICAL RESULT CALLED TO, READ BACK BY AND VERIFIED WITH: PHARMD M.BELL AT 1019 ON 06/06/2022 BY T.SAAD.    Proteus species NOT DETECTED NOT DETECTED Final   Salmonella species NOT DETECTED NOT DETECTED Final   Serratia marcescens NOT DETECTED NOT DETECTED Final   Haemophilus influenzae NOT DETECTED NOT DETECTED Final   Neisseria meningitidis NOT DETECTED NOT DETECTED Final   Pseudomonas aeruginosa NOT DETECTED NOT DETECTED Final   Stenotrophomonas maltophilia NOT DETECTED NOT DETECTED Final   Candida albicans NOT DETECTED NOT DETECTED Final    Candida auris NOT DETECTED NOT DETECTED Final   Candida glabrata NOT DETECTED NOT DETECTED Final   Candida krusei NOT DETECTED NOT DETECTED Final   Candida parapsilosis NOT DETECTED NOT DETECTED Final   Candida tropicalis NOT DETECTED NOT DETECTED Final   Cryptococcus neoformans/gattii NOT DETECTED NOT DETECTED Final   CTX-M ESBL NOT DETECTED NOT DETECTED Final   Carbapenem resistance IMP NOT DETECTED NOT DETECTED Final   Carbapenem resistance KPC NOT DETECTED NOT DETECTED Final   Carbapenem resistance NDM NOT DETECTED NOT DETECTED Final   Carbapenem resist OXA 48 LIKE NOT DETECTED NOT DETECTED Final   Carbapenem resistance VIM NOT DETECTED NOT DETECTED Final    Comment: Performed at [facility], [address], [place], [place] [address]  Blood culture (routine x 2)     Status: None   Collection Time: 06/05/22  7:25 PM   Specimen: BLOOD RIGHT FOREARM  Result Value Ref Range Status   Specimen Description   Final    BLOOD RIGHT FOREARM Performed at [facility], [place] [address] [place], [place] [address]    Special Requests   Final    BOTTLES DRAWN AEROBIC AND ANAEROBIC Blood Culture results may not be optimal due to an excessive volume of blood received in culture bottles Performed at [facility] [address] [place], [place] [address]    Culture   Final    NO GROWTH 5 DAYS Performed at [facility], [place] [address], [place], [place] [address]    Report Status 06/10/2022 FINAL  Final  Urine Culture (for pregnant, neutropenic or urologic patients or patients with an indwelling urinary catheter)     Status: None   Collection Time: 06/05/22 10:58 PM   Specimen: Urine, Clean Catch  Result Value Ref Range Status   Specimen Description   Final    URINE, CLEAN CATCH Performed at [facility], [place] [address] [place], [place] [address]    Special Requests   Final    Immunocompromised Performed at [facility], [place] [address] [place], [place] [address]    Culture   Final    NO GROWTH Performed at [facility], [place] [address] [place], [place] [address]    Report Status 06/07/2022 FINAL  Final  Gastrointestinal Panel by PCR , Stool     Status: None   Collection Time: 06/06/22 12:35 AM   Specimen: Stool  Result Value Ref Range Status   Campylobacter species NOT DETECTED NOT DETECTED Corrected   Plesimonas shigelloides NOT DETECTED  NOT DETECTED Corrected   Salmonella species NOT DETECTED NOT DETECTED Corrected   Yersinia enterocolitica NOT DETECTED NOT DETECTED Corrected   Vibrio species NOT DETECTED NOT DETECTED Corrected   Vibrio cholerae NOT DETECTED NOT DETECTED Corrected   Enteroaggregative E coli (EAEC) NOT DETECTED NOT DETECTED Corrected   Enteropathogenic E coli (EPEC) NOT DETECTED NOT DETECTED Corrected   Enterotoxigenic E coli (ETEC) NOT DETECTED NOT DETECTED Corrected   Shiga like toxin producing E coli (STEC) NOT DETECTED NOT DETECTED Corrected   Shigella/Enteroinvasive E coli (EIEC) NOT DETECTED NOT DETECTED Corrected   Cryptosporidium NOT DETECTED NOT DETECTED Corrected   Cyclospora cayetanensis NOT DETECTED NOT DETECTED Corrected   Entamoeba histolytica NOT DETECTED NOT DETECTED Corrected   Giardia lamblia NOT DETECTED NOT DETECTED Corrected   Adenovirus F40/41 NOT DETECTED NOT DETECTED Corrected   Astrovirus NOT DETECTED NOT DETECTED Corrected   Norovirus GI/GII NOT DETECTED NOT DETECTED Corrected   Rotavirus A NOT DETECTED NOT DETECTED Corrected   Sapovirus (I, II, IV, and V) NOT DETECTED NOT DETECTED Corrected    Comment: Performed at [facility], [place] [place], [place] [address]  C Difficile Quick Screen w PCR reflex     Status: Abnormal   Collection Time: 06/06/22 12:35 AM   Specimen: STOOL  Result Value Ref Range Status   C Diff antigen POSITIVE (A) NEGATIVE Final   C Diff toxin NEGATIVE NEGATIVE Final   C Diff interpretation  Results are indeterminate. See PCR results.  Final    Comment: Performed at [facility], [place] [address], [place], [place] [address]  MRSA Next Gen by PCR, Nasal     Status: None   Collection Time: 06/06/22 12:35 AM  Result Value Ref Range Status   MRSA by PCR Next Gen NOT DETECTED NOT DETECTED Final    Comment: (NOTE) The GeneXpert MRSA Assay (FDA approved for NASAL specimens only), is one component of a comprehensive MRSA colonization surveillance program. It is not intended to diagnose MRSA infection nor to guide or monitor treatment for MRSA infections. Test performance is not FDA approved in patients less than 58 years old. Performed at [facility], [place] [address], [place], [place] [address]   C. Diff by PCR, Reflexed     Status: None   Collection Time: 06/06/22 12:35 AM  Result Value Ref Range Status   Toxigenic C. Difficile by PCR NEGATIVE NEGATIVE Final    Comment: Patient is colonized with non toxigenic C. difficile. May not need treatment unless significant symptoms are present. Performed at [facility], [place] [address] [place], [place] [address]   Urine Culture (for pregnant, neutropenic or urologic patients or patients with an indwelling urinary catheter)     Status: None   Collection Time: 06/06/22  4:44 PM   Specimen: Urine, Clean Catch  Result Value Ref Range Status   Specimen Description   Final    URINE, CLEAN CATCH Performed at [facility], [place] [address], [place], [place] [address]    Special Requests   Final    NONE Performed at [facility], [place] [address], [place], [place] [address]    Culture   Final    NO GROWTH Performed at [facility], [place] [address], [place], [name] [address]    Report Status 06/07/2022 FINAL  Final    Labs: CBC: Recent Labs  Lab 07/01/22 0542 07/03/22 0646 07/04/22 0602  WBC 6.6 6.8 6.1  HGB 7.9* 8.0* 7.9*  HCT 25.6* 26.0* 25.8*   MCV 93.8 94.2 95.2  PLT 352 339 [id number]   Basic Metabolic Panel: Recent Labs  Lab 07/01/22 0542 07/02/22 0633 07/03/22 0646 07/04/22 0602  NA 138  --  137 137  K 4.3  --  4.2 3.8  CL 102  --  107 102  CO2 25  --  23 25  GLUCOSE 103*  --  117* 99  BUN 32*  --  35* 32*  CREATININE 0.53*  --  0.65 0.63  CALCIUM 8.8*  --  8.6* 8.9  MG  --  2.0  --  2.0   Liver Function Tests: Recent Labs  Lab 07/03/22 0932  ALBUMIN 2.7*   CBG: Recent Labs  Lab 07/01/22 0719  GLUCAP 101*    Discharge time spent: approximately 35 minutes spent on discharge counseling, evaluation of patient on day of discharge, and coordination of discharge planning with nursing, social work, pharmacy and case management  Signed: Edwin Dada, MD [facility] 07/06/2022

## 2022-07-09 ENCOUNTER — Telehealth: Payer: Self-pay | Admitting: Internal Medicine

## 2022-07-09 NOTE — Telephone Encounter (Signed)
Inbound call from patient wife , requesting to speak with a nurse in regards husband medication .PLease advise

## 2022-07-09 NOTE — Telephone Encounter (Signed)
Per 3/25 IB reached out to patient to schedule; patients wife agreed to be seen on the 9th with other appointments.

## 2022-07-10 ENCOUNTER — Telehealth: Payer: Self-pay

## 2022-07-10 ENCOUNTER — Other Ambulatory Visit: Payer: Self-pay | Admitting: Radiation Oncology

## 2022-07-10 DIAGNOSIS — C3490 Malignant neoplasm of unspecified part of unspecified bronchus or lung: Secondary | ICD-10-CM | POA: Diagnosis not present

## 2022-07-10 DIAGNOSIS — K222 Esophageal obstruction: Secondary | ICD-10-CM | POA: Diagnosis not present

## 2022-07-10 DIAGNOSIS — I10 Essential (primary) hypertension: Secondary | ICD-10-CM | POA: Diagnosis not present

## 2022-07-10 DIAGNOSIS — I5022 Chronic systolic (congestive) heart failure: Secondary | ICD-10-CM | POA: Diagnosis not present

## 2022-07-10 DIAGNOSIS — I639 Cerebral infarction, unspecified: Secondary | ICD-10-CM | POA: Diagnosis not present

## 2022-07-10 DIAGNOSIS — A419 Sepsis, unspecified organism: Secondary | ICD-10-CM | POA: Diagnosis not present

## 2022-07-10 DIAGNOSIS — E785 Hyperlipidemia, unspecified: Secondary | ICD-10-CM | POA: Diagnosis not present

## 2022-07-10 DIAGNOSIS — Z7189 Other specified counseling: Secondary | ICD-10-CM | POA: Diagnosis not present

## 2022-07-10 MED ORDER — LIDOCAINE VISCOUS HCL 2 % MT SOLN: 15.0000 mL | Freq: Three times a day (TID) | OROMUCOSAL | 1 refills | Status: DC

## 2022-07-10 NOTE — Telephone Encounter (Signed)
Pt wife [name] made aware of Dr. Carlean Purl recommendations: Pt verbalized understanding with all questions answered.

## 2022-07-10 NOTE — Telephone Encounter (Signed)
I will not be prescribing nutritional supplements it would be the rehab doctor or the patient's oncologist I would think

## 2022-07-10 NOTE — Telephone Encounter (Signed)
Pt wife [name] stated that Dr. Carlean Purl placed a Peg Tube recently for the pt and the pt is going to be discharged from the rehab facility soon.  [name] is questioning who will write the script for the Tube feedings. Rehab Dr, PCP, or Dr. Carlean Purl.  Please advise

## 2022-07-10 NOTE — Telephone Encounter (Signed)
Received a call from patients wife requesting a prescription for Lidocaine  solution be sent to [facility]. Per spouse patient is currently in a rehab facility and continues to have difficulty swallowing. Spouse to take medication to facility for patient.

## 2022-07-11 ENCOUNTER — Encounter: Payer: Self-pay | Admitting: Internal Medicine

## 2022-07-12 DIAGNOSIS — K529 Noninfective gastroenteritis and colitis, unspecified: Secondary | ICD-10-CM | POA: Diagnosis not present

## 2022-07-12 DIAGNOSIS — K222 Esophageal obstruction: Secondary | ICD-10-CM | POA: Diagnosis not present

## 2022-07-12 DIAGNOSIS — I5022 Chronic systolic (congestive) heart failure: Secondary | ICD-10-CM | POA: Diagnosis not present

## 2022-07-12 DIAGNOSIS — I10 Essential (primary) hypertension: Secondary | ICD-10-CM | POA: Diagnosis not present

## 2022-07-13 DIAGNOSIS — E43 Unspecified severe protein-calorie malnutrition: Secondary | ICD-10-CM | POA: Diagnosis not present

## 2022-07-13 DIAGNOSIS — G9340 Encephalopathy, unspecified: Secondary | ICD-10-CM | POA: Diagnosis not present

## 2022-07-13 DIAGNOSIS — I10 Essential (primary) hypertension: Secondary | ICD-10-CM | POA: Diagnosis not present

## 2022-07-13 DIAGNOSIS — C3490 Malignant neoplasm of unspecified part of unspecified bronchus or lung: Secondary | ICD-10-CM | POA: Diagnosis not present

## 2022-07-13 DIAGNOSIS — Z8673 Personal history of transient ischemic attack (TIA), and cerebral infarction without residual deficits: Secondary | ICD-10-CM | POA: Diagnosis not present

## 2022-07-13 DIAGNOSIS — R1314 Dysphagia, pharyngoesophageal phase: Secondary | ICD-10-CM | POA: Diagnosis not present

## 2022-07-13 DIAGNOSIS — E785 Hyperlipidemia, unspecified: Secondary | ICD-10-CM | POA: Diagnosis not present

## 2022-07-13 DIAGNOSIS — A419 Sepsis, unspecified organism: Secondary | ICD-10-CM | POA: Diagnosis not present

## 2022-07-13 DIAGNOSIS — M6281 Muscle weakness (generalized): Secondary | ICD-10-CM | POA: Diagnosis not present

## 2022-07-13 DIAGNOSIS — K222 Esophageal obstruction: Secondary | ICD-10-CM | POA: Diagnosis not present

## 2022-07-13 DIAGNOSIS — I5022 Chronic systolic (congestive) heart failure: Secondary | ICD-10-CM | POA: Diagnosis not present

## 2022-07-13 DIAGNOSIS — I639 Cerebral infarction, unspecified: Secondary | ICD-10-CM | POA: Diagnosis not present

## 2022-07-13 DIAGNOSIS — Z7189 Other specified counseling: Secondary | ICD-10-CM | POA: Diagnosis not present

## 2022-07-14 DIAGNOSIS — Z8673 Personal history of transient ischemic attack (TIA), and cerebral infarction without residual deficits: Secondary | ICD-10-CM | POA: Diagnosis not present

## 2022-07-14 DIAGNOSIS — E43 Unspecified severe protein-calorie malnutrition: Secondary | ICD-10-CM | POA: Diagnosis not present

## 2022-07-14 DIAGNOSIS — M6281 Muscle weakness (generalized): Secondary | ICD-10-CM | POA: Diagnosis not present

## 2022-07-14 DIAGNOSIS — G9340 Encephalopathy, unspecified: Secondary | ICD-10-CM | POA: Diagnosis not present

## 2022-07-14 DIAGNOSIS — R1314 Dysphagia, pharyngoesophageal phase: Secondary | ICD-10-CM | POA: Diagnosis not present

## 2022-07-16 ENCOUNTER — Telehealth: Payer: Self-pay | Admitting: Internal Medicine

## 2022-07-16 ENCOUNTER — Telehealth: Payer: Self-pay | Admitting: Nurse Practitioner

## 2022-07-16 NOTE — Telephone Encounter (Signed)
Inbound call from patient wife requesting to speak with you regarding a feeding tube.Please advise

## 2022-07-16 NOTE — Telephone Encounter (Signed)
I think if he is stable and has not had any new complaints it would be reasonable for him to reschedule until after monitor is completed.   Pt spouse called back and made aware of [name] NP comments orders.  Pt spouse will put Zio Heart Monitor on husband and begin recordings, but asked that I cancel the 07/27/2022 appointment.  Pt spouse will call back to schedule another appointment after the Zio heart monitor results are complete.  Pt spouse stated the Pt has had no new complaints or symptoms.

## 2022-07-16 NOTE — Telephone Encounter (Signed)
Pt's spouse would like a callback regarding upcoming appt on 4/12 to see if its still necessary and if pt needs to start the [facility] monitor. Please advise.

## 2022-07-16 NOTE — Telephone Encounter (Signed)
Call back per message received in [place] Triage.   Pt spouse stated Pt just came home from hospital on Friday, 07/13/2022.  Pt has not started wear the long term Zio monitor yet.   Pt spouse wanted to know if they should come in for the 07/27/22 appointment with [name] NP, if no monitor results are available?    Pt spouse advised that I will reach out to [name] NP with this questions, obtain an answer, and follow up with them.  Pt spouse understood.

## 2022-07-17 ENCOUNTER — Other Ambulatory Visit: Payer: Self-pay

## 2022-07-17 ENCOUNTER — Ambulatory Visit: Payer: Medicare Other | Admitting: Nutrition

## 2022-07-17 ENCOUNTER — Telehealth: Payer: Self-pay | Admitting: Medical Oncology

## 2022-07-17 NOTE — Telephone Encounter (Signed)
Asking for feeding tube nutrient until supply comes in at facility or home health . Message sent to inpt and out pt dietician.  Tried calling wife for more info. but no answer.

## 2022-07-17 NOTE — Telephone Encounter (Signed)
Pt wife Jeannene Patella stated that she is requesting feeding for the feeding tube that was recently placed. Pt chart reviewed and notified pt once more of Dr. Carlean Purl recommendations  it would be the rehab doctor or the patient's oncologist.  [name]  verbalized understanding with all questions answered.

## 2022-07-17 NOTE — Telephone Encounter (Signed)
[name] needs tube feeding order. Rehab is working on getting supply to pt today.

## 2022-07-17 NOTE — Progress Notes (Signed)
Received message from nursing that patient needed formula for feeding tube.  Patient was assessed by [facility] RD on February 19.  Please see assessment for details.  Jesse [name] is a 72 y.o. 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 Found to have pneumonia, Klebsiella bacteremia and small embolic stroke. Hospitalization complicated by new reduced EF and also poor oral intake/radiation esophagitis requiring PEG placement.  He was tolerating Osmolite 1.5 at 85 mL/h for 16 hours via PEG plus Prosource, 60 mL via tube daily. He was discharged to [facility] on March 22 and discharged home March 29.  Wife was told [employer] would provide supplies and formula.  The rehab facility gave patient 1 L of formula at the time of discharge.  Wife divided the formula in half and gave half on Friday and the remaining half on Saturday.  She received supplies on Friday night however did not receive any formula.  She has been forcing patient to eat by mouth is much as possible.  Wife reports the [employer] driver just dropped off four cases of boost which contains 240 cal and 10 g of protein per 8 ounces.  This is not an equivalent substitute for the tube feeding which was ordered.  I will provide 2 cases of Osmolite 1.5 and 2 ENFit syringes for wife to pick up from [facility] on Wednesday morning.  Provided [employer] telephone number and asked her to clarify tube feeding delivery.  Recommend beginning Osmolite 1.5 at 85 mL an hour for 16 hours from 4 PM to 8 AM via PEG. Instructed to give 60 mL of free water flush before and after continuous tube feeding. May increase continuous feedings as tolerated every 4 hours by 20 mL to goal of 115 mL/h over 12 hours.  Hold tube feeding for nausea, vomiting.  Use room temperature formula and water.  Estimated nutrition needs:  2000-2300 cal, 85-100 g protein, greater than 2 L  fluid.  6 cartons of Osmolite 1.5 will provide 2130 cal and 89.4 g protein and 1146 mL free water.  Encourage patient to eat soft foods and drink liquids as tolerated.  Tube feeding will be adjusted based on oral intake, tube feeding tolerance and weight trends.  All questions answered and contact information for this RD was given.  Nutrition follow-up with this RD is scheduled for April 9.  Encouraged patient's wife to contact RD with questions before next visit.  **Disclaimer: This note was dictated with voice recognition software. Similar sounding words can inadvertently be transcribed and this note may contain transcription errors which may not have been corrected upon publication of note.**

## 2022-07-18 ENCOUNTER — Telehealth: Payer: Self-pay

## 2022-07-18 NOTE — Progress Notes (Signed)
Care Management & Coordination Services Pharmacy Team  Reason for Encounter: Hypertension  Contacted patient to discuss hypertension disease state. Unsuccessful outreach. Left voicemail for patient to return call. Multiple attempts    Current antihypertensive regimen:  Metoprolol 25 mg twice daily  Patient verbally confirms he is taking the above medications as directed.   How often are you checking your Blood Pressure?  he checks his blood pressure   taking his medication.  Current home BP readings:  DATE:             BP               PULSE   Wrist or arm cuff:  OTC medications including pseudoephedrine or NSAIDs?  Any readings above 180/100?  If yes any symptoms of hypertensive emergency?   What recent interventions/DTPs have been made by any provider to improve Blood Pressure control since last CPP Visit: No recent interventions noted for blood pressure  Any recent hospitalizations or ED visits since last visit with CPP? Admitted [facility] on 06/05/2022 due to Severe sepsis due to Klebsiella pneumonia and bacteremia and additional concerns.   What diet changes have been made to improve Blood Pressure Control?   What exercise is being done to improve your Blood Pressure Control?    Adherence Review: Is the patient currently on ACE/ARB medication? No Does the patient have >5 day gap between last estimated fill dates? No  Care Gaps: AWV - completed 07/18/2021 Covid - overdue  Star Rating Drugs: Atorvastatin 40mg  - [facility]  Chart Updates: Recent office visits:  None  Recent consult visits:  05/28/2022 Cassandra Heilingoetter PA-C (cancer center) - Patient was seen for Recurrent non-small cell lung cancer and additional concerns. Started Mirtazapine and Sucralfate.   05/14/2022 Si GaulMohamed Mohamed MD (oncology) - Patient was seen for Recurrent non-small cell lung cancer.  No medication changes.   04/30/2022 Si GaulMohamed Mohamed MD  (oncology) - Patient was seen for Malignant neoplasm of unspecified part of unspecified bronchus or lung and an additional concern. Started Megestrol and Prochlorperazine.   Hospital visits:  Admitted to [facility] on 06/05/2022 due to Severe sepsis due to Klebsiella pneumonia and bacteremia and additional concerns. Discharge date was 07/06/2022. [facility]   [facility]:?? aspirin feeding supplement (PROSource TF20) feeding supplement feeding supplement (OSMOLITE 1.5 CAL) metoprolol tartrate (LOPRESSOR) Medication Changes at Hospital Discharge: None Medications Discontinued at Hospital Discharge: amLODipine 5 MG tablet (NORVASC) megestrol 625 MG/5ML suspension (MEGACE ES) omeprazole 40 MG capsule (PRILOSEC) OVER THE COUNTER MEDICATION potassium chloride SA 20 MEQ tablet (KLOR-CON M) Medications that remain the same after Hospital Discharge:??  -All other medications will remain the same.    Medications: Outpatient Encounter Medications as of 07/18/2022  Medication Sig Note   acetaminophen (TYLENOL) 500 MG tablet Take 1,000 mg by mouth every 8 (eight) hours as needed for moderate pain.    aspirin 81 MG chewable tablet Chew 1 tablet (81 mg total) by mouth daily.    atorvastatin (LIPITOR) 40 MG tablet Take 1/2 tablet (20 mg total) by mouth daily.    Cholecalciferol (VITAMIN D3 PO) Take 2,000 Units by mouth daily.    cholestyramine (QUESTRAN) 4 GM/DOSE powder Take 4 g by mouth 2 (two) times daily.    diphenoxylate-atropine (LOMOTIL) 2.5-0.025 MG tablet Take 2 tablets by mouth 4 (four) times daily as needed for diarrhea or loose stools.    feeding supplement (  ENSURE ENLIVE / ENSURE PLUS) LIQD Take 237 mLs by mouth 3 (three) times daily between meals.    ferrous sulfate 325 (65 FE) MG EC tablet Take 1 tablet (325 mg total) by mouth 2 (two) times daily.    imatinib (GLEEVEC) 400 MG tablet Take 1 tablet (400 mg  total) by mouth daily. Take with meals and large glass of water.Caution:Chemotherapy. (Patient taking differently: Take 400 mg by mouth at bedtime. Take with meals and large glass of water.Caution:Chemotherapy.)    lidocaine (XYLOCAINE) 2 % solution Use as directed 15 mLs in the mouth or throat 4 (four) times daily -  before meals and at bedtime.    loperamide (IMODIUM A-D) 2 MG tablet Take 4 mg by mouth 4 (four) times daily as needed for diarrhea or loose stools.    metoprolol tartrate (LOPRESSOR) 25 MG tablet Take 1 tablet (25 mg total) by mouth 2 (two) times daily.    mirtazapine (REMERON) 15 MG tablet Take 1 tablet (15 mg total) by mouth at bedtime.    Multiple Vitamins-Minerals (PRESERVISION AREDS 2) CAPS Take 1 capsule by mouth 2 (two) times daily.    Nutritional Supplements (FEEDING SUPPLEMENT, OSMOLITE 1.5 CAL,) LIQD Place 1,000 mLs into feeding tube daily. -Continue cyclic tube feed of 16 hours (off 8 hours during the day) -Osmolite 1.5 @ 26mL/hr x16 hours (1600-0800) via NGT -30cc H20 flush Q4hours.    ondansetron (ZOFRAN) 4 MG tablet Take 4 mg by mouth every 8 (eight) hours as needed for nausea or vomiting.    polycarbophil (FIBERCON) 625 MG tablet Take 2,500 mg by mouth daily. Taking 4 tabs ( 625 mg each)  daily    prochlorperazine (COMPAZINE) 10 MG tablet Take 1 tablet (10 mg total) by mouth every 6 (six) hours as needed for nausea or vomiting. 06/06/2022: Also has 5 mg tablet at home   Protein (FEEDING SUPPLEMENT, PROSOURCE TF20,) liquid Place 60 mLs into feeding tube daily.    sucralfate (CARAFATE) 1 g tablet Take 1 tablet by mouth 4 times daily -  with meals and at bedtime.    No facility-administered encounter medications on file as of 07/18/2022.  Fill History:  Dispensed Days Supply Quantity Provider Pharmacy  ATORVASTATIN 20 MG TABLET 07/13/2022 14 14 each      Dispensed Days Supply Quantity Provider Pharmacy  FERROUS SULF EC 325 MG TABLET 05/24/2022 30 60 each      Dispensed  Days Supply Quantity Provider Pharmacy  LIDOCAINE VISCOUS  2 % SOLN 07/10/2022 7 420 mL      Dispensed Days Supply Quantity Provider Pharmacy  MIRTAZAPINE 15 MG TABLET 07/14/2022 14 14 each      Dispensed Days Supply Quantity Provider Pharmacy  ONDANSETRON HCL 4 MG TABLET 07/13/2022 5 15 each      Dispensed Days Supply Quantity Provider Pharmacy  SUCRALFATE 1 GM TABLET 07/14/2022 14 56 each      Recent Office Vitals: BP Readings from Last 3 Encounters:  07/06/22 116/64  06/04/22 (!) 113/52  05/28/22 131/71   Pulse Readings from Last 3 Encounters:  07/06/22 92  06/04/22 94  05/28/22 96    Wt Readings from Last 3 Encounters:  07/05/22 128 lb 8.5 oz (58.3 kg)  06/11/22 148 lb 2.4 oz (67.2 kg)  06/04/22 135 lb 12 oz (61.6 kg)     Kidney Function Lab Results  Component Value Date/Time   CREATININE 0.63 07/04/2022 06:02 AM   CREATININE 0.65 07/03/2022 06:46 AM   CREATININE 1.09 06/04/2022 11:52  AM   CREATININE 0.75 05/28/2022 09:51 AM   GFR 87.28 10/25/2021 08:51 AM   GFRNONAA >60 07/04/2022 06:02 AM   GFRNONAA >60 06/04/2022 11:52 AM   GFRAA >60 12/31/2019 02:28 PM       Latest Ref Rng & Units 07/04/2022    6:02 AM 07/03/2022    6:46 AM 07/01/2022    5:42 AM  BMP  Glucose 70 - 99 mg/dL 99  161  096   BUN 8 - 23 mg/dL 32  35  32   Creatinine 0.61 - 1.24 mg/dL 0.45  4.09  8.11   Sodium 135 - 145 mmol/L 137  137  138   Potassium 3.5 - 5.1 mmol/L 3.8  4.2  4.3   Chloride 98 - 111 mmol/L 102  107  102   CO2 22 - 32 mmol/L Calcium 8.9 - 10.3 mg/dL 8.9  8.6  8.8    Inetta Fermo [facility]  Clinical Pharmacist Assistant [phone]

## 2022-07-19 ENCOUNTER — Other Ambulatory Visit: Payer: Self-pay | Admitting: Physician Assistant

## 2022-07-19 DIAGNOSIS — G9340 Encephalopathy, unspecified: Secondary | ICD-10-CM | POA: Diagnosis not present

## 2022-07-19 DIAGNOSIS — R1314 Dysphagia, pharyngoesophageal phase: Secondary | ICD-10-CM | POA: Diagnosis not present

## 2022-07-19 DIAGNOSIS — E43 Unspecified severe protein-calorie malnutrition: Secondary | ICD-10-CM | POA: Diagnosis not present

## 2022-07-19 DIAGNOSIS — M6281 Muscle weakness (generalized): Secondary | ICD-10-CM | POA: Diagnosis not present

## 2022-07-19 DIAGNOSIS — Z931 Gastrostomy status: Secondary | ICD-10-CM | POA: Diagnosis not present

## 2022-07-20 DIAGNOSIS — I491 Atrial premature depolarization: Secondary | ICD-10-CM

## 2022-07-20 DIAGNOSIS — I6340 Cerebral infarction due to embolism of unspecified cerebral artery: Secondary | ICD-10-CM

## 2022-07-20 DIAGNOSIS — R000 Tachycardia, unspecified: Secondary | ICD-10-CM

## 2022-07-23 ENCOUNTER — Telehealth: Payer: Self-pay | Admitting: Family Medicine

## 2022-07-23 ENCOUNTER — Ambulatory Visit (HOSPITAL_COMMUNITY)
Admission: RE | Admit: 2022-07-23 | Discharge: 2022-07-23 | Disposition: A | Discharge status: Home / Self Care | Payer: Medicare Other | Source: Ambulatory Visit | Attending: Physician Assistant | Admitting: Physician Assistant

## 2022-07-23 DIAGNOSIS — C3490 Malignant neoplasm of unspecified part of unspecified bronchus or lung: Secondary | ICD-10-CM | POA: Diagnosis not present

## 2022-07-23 DIAGNOSIS — R918 Other nonspecific abnormal finding of lung field: Secondary | ICD-10-CM | POA: Diagnosis not present

## 2022-07-23 DIAGNOSIS — J439 Emphysema, unspecified: Secondary | ICD-10-CM | POA: Diagnosis not present

## 2022-07-23 MED ORDER — IOHEXOL 300 MG/ML  SOLN
75.0000 mL | Freq: Once | INTRAMUSCULAR | Status: AC | PRN
  Administered 2022-07-23: 75 mL via INTRAVENOUS

## 2022-07-23 NOTE — Telephone Encounter (Signed)
Called patient to schedule Medicare Annual Wellness Visit (AWV). Left message for patient to call back and schedule Medicare Annual Wellness Visit (AWV).  Last date of AWV: 07/18/21  Please schedule an appointment at any time with [facility] or [facility].  If any questions, please contact me at [phone].  Thank you ,  Rudell Cobb AWV direct phone # [phone]

## 2022-07-24 ENCOUNTER — Inpatient Hospital Stay: Payer: Medicare Other | Admitting: Internal Medicine

## 2022-07-24 ENCOUNTER — Encounter: Payer: Self-pay | Admitting: Nurse Practitioner

## 2022-07-24 ENCOUNTER — Inpatient Hospital Stay: Payer: Medicare Other | Admitting: Nutrition

## 2022-07-24 ENCOUNTER — Other Ambulatory Visit: Payer: Self-pay

## 2022-07-24 ENCOUNTER — Inpatient Hospital Stay (HOSPITAL_BASED_OUTPATIENT_CLINIC_OR_DEPARTMENT_OTHER): Payer: Medicare Other | Admitting: Nurse Practitioner

## 2022-07-24 ENCOUNTER — Inpatient Hospital Stay: Payer: Medicare Other | Attending: Internal Medicine

## 2022-07-24 VITALS — BP 126/72 | HR 81 | Temp 97.7°F | Resp 15 | Ht 69.0 in | Wt 133.0 lb

## 2022-07-24 DIAGNOSIS — Z79899 Other long term (current) drug therapy: Secondary | ICD-10-CM | POA: Insufficient documentation

## 2022-07-24 DIAGNOSIS — Z902 Acquired absence of lung [part of]: Secondary | ICD-10-CM | POA: Diagnosis not present

## 2022-07-24 DIAGNOSIS — E785 Hyperlipidemia, unspecified: Secondary | ICD-10-CM | POA: Insufficient documentation

## 2022-07-24 DIAGNOSIS — Z7189 Other specified counseling: Secondary | ICD-10-CM

## 2022-07-24 DIAGNOSIS — Z923 Personal history of irradiation: Secondary | ICD-10-CM | POA: Diagnosis not present

## 2022-07-24 DIAGNOSIS — C3412 Malignant neoplasm of upper lobe, left bronchus or lung: Secondary | ICD-10-CM | POA: Diagnosis not present

## 2022-07-24 DIAGNOSIS — G893 Neoplasm related pain (acute) (chronic): Secondary | ICD-10-CM | POA: Diagnosis not present

## 2022-07-24 DIAGNOSIS — I10 Essential (primary) hypertension: Secondary | ICD-10-CM | POA: Insufficient documentation

## 2022-07-24 DIAGNOSIS — Z7982 Long term (current) use of aspirin: Secondary | ICD-10-CM | POA: Diagnosis not present

## 2022-07-24 DIAGNOSIS — C3490 Malignant neoplasm of unspecified part of unspecified bronchus or lung: Secondary | ICD-10-CM

## 2022-07-24 DIAGNOSIS — R530 Neoplastic (malignant) related fatigue: Secondary | ICD-10-CM | POA: Diagnosis not present

## 2022-07-24 DIAGNOSIS — C771 Secondary and unspecified malignant neoplasm of intrathoracic lymph nodes: Secondary | ICD-10-CM | POA: Diagnosis not present

## 2022-07-24 DIAGNOSIS — Z515 Encounter for palliative care: Secondary | ICD-10-CM | POA: Diagnosis not present

## 2022-07-24 DIAGNOSIS — I2510 Atherosclerotic heart disease of native coronary artery without angina pectoris: Secondary | ICD-10-CM | POA: Diagnosis not present

## 2022-07-24 DIAGNOSIS — C3492 Malignant neoplasm of unspecified part of left bronchus or lung: Secondary | ICD-10-CM

## 2022-07-24 DIAGNOSIS — Z9221 Personal history of antineoplastic chemotherapy: Secondary | ICD-10-CM | POA: Insufficient documentation

## 2022-07-24 LAB — CMP (CANCER CENTER ONLY)
ALT: 27 U/L (ref 0–44)
AST: 26 U/L (ref 15–41)
Albumin: 3.2 g/dL — ABNORMAL LOW (ref 3.5–5.0)
Alkaline Phosphatase: 116 U/L (ref 38–126)
Anion gap: 6 (ref 5–15)
BUN: 30 mg/dL — ABNORMAL HIGH (ref 8–23)
CO2: 28 mmol/L (ref 22–32)
Calcium: 9.1 mg/dL (ref 8.9–10.3)
Chloride: 103 mmol/L (ref 98–111)
Creatinine: 0.7 mg/dL (ref 0.61–1.24)
GFR, Estimated: >60 mL/min (ref >60)
Glucose, Bld: 100 mg/dL — ABNORMAL HIGH (ref 70–99)
Potassium: 4.5 mmol/L (ref 3.5–5.1)
Sodium: 137 mmol/L (ref 135–145)
Total Bilirubin: 0.3 mg/dL (ref 0.3–1.2)
Total Protein: 6.8 g/dL (ref 6.5–8.1)

## 2022-07-24 LAB — CBC WITH DIFFERENTIAL (CANCER CENTER ONLY)
Abs Immature Granulocytes: 0.03 10*3/uL (ref 0.00–0.07)
Basophils Absolute: 0 10*3/uL (ref 0.0–0.1)
Basophils Relative: 0 %
Eosinophils Absolute: 0.1 10*3/uL (ref 0.0–0.5)
Eosinophils Relative: 2 %
HCT: 26.7 % — ABNORMAL LOW (ref 39.0–52.0)
Hemoglobin: 8.5 g/dL — ABNORMAL LOW (ref 13.0–17.0)
Immature Granulocytes: 0 %
Lymphocytes Relative: 8 %
Lymphs Abs: 0.5 10*3/uL — ABNORMAL LOW (ref 0.7–4.0)
MCH: 29.4 pg (ref 26.0–34.0)
MCHC: 31.8 g/dL (ref 30.0–36.0)
MCV: 92.4 fL (ref 80.0–100.0)
Monocytes Absolute: 0.4 10*3/uL (ref 0.1–1.0)
Monocytes Relative: 5 %
Neutro Abs: 5.8 10*3/uL (ref 1.7–7.7)
Neutrophils Relative %: 85 %
Platelet Count: 270 10*3/uL (ref 150–400)
RBC: 2.89 MIL/uL — ABNORMAL LOW (ref 4.22–5.81)
RDW: 16.9 % — ABNORMAL HIGH (ref 11.5–15.5)
WBC Count: 6.9 10*3/uL (ref 4.0–10.5)
nRBC: 0 % (ref 0.0–0.2)

## 2022-07-24 NOTE — Progress Notes (Signed)
[facility] [phone]   [phone]  OFFICE PROGRESS NOTE  [name], MD [address] [place] [address]  DIAGNOSIS:  1) recurrent non-small cell lung cancer, adenocarcinoma with positive mediastinal lymph node in December 2023 that was initially diagnosed as stage IIB (T1 a, N1, M0) non-small cell lung cancer, adenocarcinoma diagnosed in June 2021. 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  Biomarker Findings Tumor Mutational Burden - 20 Muts/Mb Microsatellite status - MS-Stable Genomic Findings For a complete list of the genes assayed, please refer to the Appendix. KEAP1 I425fs*17 DNMT3A E817* RB1 splice site 1960+5G>C TP53 R209* 8 Disease relevant genes with no reportable alterations: ALK, BRAF, EGFR, ERBB2, KRAS, MET, RET, ROS1  PDL1 Expression: 1 %  PRIOR THERAPY:  1) status post left upper lobectomy with lymph node dissection on December 16, 2019 under the care of Dr. Cliffton Asters.  The tumor size measured 0.9 cm but there was involvement of the level 10 L and 11 L.  2) Adjuvant systemic chemotherapy with cisplatin 75 mg/M2 and Alimta 500 mg/M2 every 3 weeks.  First dose February 04, 2020.  Status post 4 cycles. 3) Neoadjuvant treatment with imatinib 400 mg p.o. daily for the GIST tumor of the pelvic area.  First dose started July 18, 2020.  Status post 12 months of treatment. 4) status post robotic assisted very low anterior rectosigmoid resection with coloanal anastomosis, diverting loop ileostomy and wedge liver biopsy under the care of Dr. Michaell Cowing on December 14, 2021 and it showed minimal residual gastrointestinal stromal tumor. 5) concurrent chemoradiation with weekly carboplatin for AUC of 2 and paclitaxel 45 Mg/M2.  First dose May 14, 2022.  CURRENT THERAPY: Imatinib 400 mg p.o. daily  INTERVAL HISTORY: Jesse [name] 72 y.o. male returns to the clinic today for follow-up visit accompanied by his wife.   The patient is feeling much better today.  He was admitted to the hospital on June 05, 2022 with multifocal pneumonia secondary to Klebsiella pneumonia and bacteremia with sepsis.  He also had acute metabolic encephalopathy and acute kidney injury as well as respiratory failure and embolic stroke during his hospitalization.  He was also found to have esophageal dysphagia secondary to radiation induced esophagitis and he had a PEG tube placed for nutrition.  The patient was discharged to a skilled nursing facility on July 06, 2022 and he stayed there for another 2 weeks.  He finally made at home a week ago.  He is gaining some weight and feeling a little bit better with no significant shortness of breath.  He has no chest pain or hemoptysis.  He has no current nausea, vomiting, diarrhea or constipation.  He had repeat CT scan of the chest performed recently and he is here for evaluation and discussion of his scan results.   MEDICAL HISTORY: Past Medical History:  Diagnosis Date   Anemia    Cancer (HCC)    bladder cancer   COPD (chronic obstructive pulmonary disease) (HCC)    mild    DIVERTICULOSIS, COLON 01/14/2007   Qualifier: Diagnosis of  By: Marcelyn Ditty RN, Katy Fitch    GASTRITIS, CHRONIC 06/01/2004   Qualifier: Diagnosis of  By: Creta Levin CMA (AAMA), Robin     GERD 01/09/2007   Qualifier: Diagnosis of  By: Tawanna Cooler RN, Ellen     Heart murmur    as a child; no murmur heard 12/14/19   HYPERLIPIDEMIA 01/09/2007   Qualifier: Diagnosis of  By:  Tawanna Cooler, RN, Alvino Chapel     Hypertension    Lung mass    left upper nodule   Macular degeneration    right eye   NEOPLASM, MALIGNANT, BLADDER, HX OF 2002   Qualifier: Diagnosis of  By: Creta Levin CMA (AAMA), Robin  / no chemo or radiation   OSTEOARTHRITIS 01/14/2007   Qualifier: Diagnosis of  By: Marcelyn Ditty, RN, Katy Fitch - knees   Rectal mass    Reflux esophagitis 06/01/2004   Qualifier: Diagnosis of  By: Creta Levin CMA (AAMA), Robin     TOBACCO USER 02/16/2009    Qualifier: Diagnosis of  By: Amador Cunas  MD, Janett Labella    VITAMIN D DEFICIENCY 06/03/2007   Qualifier: Diagnosis of  By: Amador Cunas  MD, Janett Labella     ALLERGIES:  has No Known Allergies.  MEDICATIONS:  Current Outpatient Medications  Medication Sig Dispense Refill   acetaminophen (TYLENOL) 500 MG tablet Take 1,000 mg by mouth every 8 (eight) hours as needed for moderate pain.     aspirin 81 MG chewable tablet Chew 1 tablet (81 mg total) by mouth daily.     atorvastatin (LIPITOR) 40 MG tablet Take 1/2 tablet (20 mg total) by mouth daily. 90 tablet 3   Cholecalciferol (VITAMIN D3 PO) Take 2,000 Units by mouth daily.     cholestyramine (QUESTRAN) 4 GM/DOSE powder Take 4 g by mouth 2 (two) times daily.     diphenoxylate-atropine (LOMOTIL) 2.5-0.025 MG tablet Take 2 tablets by mouth 4 (four) times daily as needed for diarrhea or loose stools.     feeding supplement (ENSURE ENLIVE / ENSURE PLUS) LIQD Take 237 mLs by mouth 3 (three) times daily between meals. 237 mL 12   ferrous sulfate 325 (65 FE) MG EC tablet Take 1 tablet (325 mg total) by mouth 2 (two) times daily. 60 tablet 1   imatinib (GLEEVEC) 400 MG tablet Take 1 tablet (400 mg total) by mouth daily. Take with meals and large glass of water.Caution:Chemotherapy. (Patient taking differently: Take 400 mg by mouth at bedtime. Take with meals and large glass of water.Caution:Chemotherapy.) 30 tablet 3   lidocaine (XYLOCAINE) 2 % solution Use as directed 15 mLs in the mouth or throat 4 (four) times daily -  before meals and at bedtime. 420 mL 1   loperamide (IMODIUM A-D) 2 MG tablet Take 4 mg by mouth 4 (four) times daily as needed for diarrhea or loose stools.     metoprolol tartrate (LOPRESSOR) 25 MG tablet Take 1 tablet (25 mg total) by mouth 2 (two) times daily.     mirtazapine (REMERON) 15 MG tablet Take 1 tablet (15 mg total) by mouth at bedtime. 30 tablet 2   Multiple Vitamins-Minerals (PRESERVISION AREDS 2) CAPS Take 1 capsule by mouth 2  (two) times daily.     Nutritional Supplements (FEEDING SUPPLEMENT, OSMOLITE 1.5 CAL,) LIQD Place 1,000 mLs into feeding tube daily. -Continue cyclic tube feed of 16 hours (off 8 hours during the day) -Osmolite 1.5 @ 45mL/hr x16 hours (1600-0800) via NGT -30cc H20 flush Q4hours.  0   ondansetron (ZOFRAN) 4 MG tablet Take 4 mg by mouth every 8 (eight) hours as needed for nausea or vomiting.     polycarbophil (FIBERCON) 625 MG tablet Take 2,500 mg by mouth daily. Taking 4 tabs ( 625 mg each)  daily     prochlorperazine (COMPAZINE) 10 MG tablet Take 1 tablet (10 mg total) by mouth every 6 (six) hours as needed for nausea or vomiting. 30  tablet 0   Protein (FEEDING SUPPLEMENT, PROSOURCE TF20,) liquid Place 60 mLs into feeding tube daily.     sucralfate (CARAFATE) 1 g tablet Take 1 tablet by mouth 4 times daily -  with meals and at bedtime. 60 tablet 1   No current facility-administered medications for this visit.    SURGICAL HISTORY:  Past Surgical History:  Procedure Laterality Date   BIOPSY  06/18/2022   Procedure: BIOPSY;  Surgeon: Shellia Cleverly, DO;  Location: [facility];  Service: Gastroenterology;;   BREAST BIOPSY Left 03/16/2022   [place] LT RADIOACTIVE SEED LOC 03/16/2022 [facility]   BRONCHIAL BIOPSY  10/13/2019   Procedure: BRONCHIAL BIOPSIES;  Surgeon: Josephine Igo, DO;  [facility]: [facility];  Service: Pulmonary;;   BRONCHIAL BRUSHINGS  10/13/2019   Procedure: BRONCHIAL BRUSHINGS;  Surgeon: Josephine Igo, DO;  [facility];  Service: Pulmonary;;   BRONCHIAL NEEDLE ASPIRATION BIOPSY  10/13/2019   Procedure: BRONCHIAL NEEDLE ASPIRATION BIOPSIES;  Surgeon: Josephine Igo, DO;  [facility];  Service: Pulmonary;;   BRONCHIAL NEEDLE ASPIRATION BIOPSY  04/11/2022   Procedure: BRONCHIAL NEEDLE ASPIRATION BIOPSIES;  Surgeon: Josephine Igo, DO;  [facility]: [facility];  Service: Cardiopulmonary;;   BRONCHIAL WASHINGS  10/13/2019   Procedure: BRONCHIAL  WASHINGS;  Surgeon: Josephine Igo, DO;  [facility];  Service: Pulmonary;;   COLONOSCOPY     multiple   DIVERTING ILEOSTOMY N/A 12/14/2021   Procedure: DIVERTING ILEOSTOMY;  Surgeon: Karie Soda, MD;  Location: [facility];  Service: General;  Laterality: N/A;   ENDOBRONCHIAL ULTRASOUND Bilateral 04/11/2022   Procedure: ENDOBRONCHIAL ULTRASOUND;  Surgeon: Josephine Igo, DO;  Location: [facility];  Service: Cardiopulmonary;  Laterality: Bilateral;   ESOPHAGEAL DILATION  06/18/2022   Procedure: ESOPHAGEAL DILATION;  Surgeon: Shellia Cleverly, DO;  [facility];  Service: Gastroenterology;;   ESOPHAGOGASTRODUODENOSCOPY (EGD) WITH PROPOFOL N/A 06/18/2022   Procedure: ESOPHAGOGASTRODUODENOSCOPY (EGD) WITH PROPOFOL;  Surgeon: Shellia Cleverly, DO;  [facility];  Service: Gastroenterology;  Laterality: N/A;  Severe dysphagia/odynophagia ?radiation esophagitis versus oral candidiasis   ILEOSTOMY CLOSURE N/A 03/22/2022   Procedure: OPEN TAKEDOWN OF LOOP ILEOSTOMY;  Surgeon: Karie Soda, MD;  Location: [facility];  Service: General;  Laterality: N/A;  GEN w/ERAS PATHWAY LOCAL   INTERCOSTAL NERVE BLOCK Left 12/16/2019   Procedure: INTERCOSTAL NERVE BLOCK;  Surgeon: Corliss Skains, MD;  Location: [facility];  Service: Thoracic;  Laterality: Left;   IR FLUORO RM 30-60 MIN  07/02/2022   KNEE SURGERY  07/20/2008   bilat.   NASAL SEPTUM SURGERY  1995   NODE DISSECTION Left 12/16/2019   Procedure: NODE DISSECTION;  Surgeon: Corliss Skains, MD;  [facility];  Service: Thoracic;  Laterality: Left;   PEG PLACEMENT N/A 07/04/2022   Procedure: PERCUTANEOUS ENDOSCOPIC GASTROSTOMY (PEG) PLACEMENT;  Surgeon: Iva Boop, MD;  Location: [facility];  Service: Gastroenterology;  Laterality: N/A;   RADIOACTIVE SEED GUIDED AXILLARY SENTINEL LYMPH NODE Left 03/20/2022   Procedure: RADIOACTIVE SEED GUIDED LEFT AXILLARY SENTINEL LYMPH NODE BIOPSY;  Surgeon: Harriette Bouillon, MD;   [facility];  Service: General;  Laterality: Left;   RECTAL EXAM UNDER ANESTHESIA N/A 03/22/2022   Procedure: ANORECTAL EXAMINATION UNDER ANESTHESIA;  Surgeon: Karie Soda, MD;  Location: [facility];  Service: General;  Laterality: N/A;   TONSILLECTOMY     TRANSURETHRAL RESECTION OF BLADDER TUMOR  2002   UPPER GASTROINTESTINAL ENDOSCOPY     VIDEO BRONCHOSCOPY  04/11/2022  Procedure: VIDEO BRONCHOSCOPY;  Surgeon: Josephine IgoIcard, Bradley L, DO;  [facility]: [facility];  Service: Cardiopulmonary;;   VIDEO BRONCHOSCOPY WITH ENDOBRONCHIAL NAVIGATION N/A 10/13/2019   Procedure: VIDEO BRONCHOSCOPY WITH ENDOBRONCHIAL NAVIGATION;  Surgeon: Josephine IgoIcard, Bradley L, DO;  [facility];  Service: Pulmonary;  Laterality: N/A;   WISDOM TOOTH EXTRACTION     XI ROBOTIC ASSISTED LOWER ANTERIOR RESECTION N/A 12/14/2021   Procedure: XI ROBOTIC ASSISTED LOWER ANTERIOR ULTRA LOW RECTOSIGMOID RESECTION, COLOANAL HAND SEWN ANASTOMOSIS, AND BILATERAL TAP BLOCK;  Surgeon: Karie SodaGross, Steven, MD;  Location: [facility];  Service: General;  Laterality: N/A;    REVIEW OF SYSTEMS:  Constitutional: positive for fatigue and weight loss Eyes: negative Ears, nose, mouth, throat, and face: negative Respiratory: negative Cardiovascular: negative Gastrointestinal: positive for dysphagia Genitourinary:negative Integument/breast: negative Hematologic/lymphatic: negative Musculoskeletal:positive for muscle weakness Neurological: negative Behavioral/Psych: negative Endocrine: negative Allergic/Immunologic: negative   PHYSICAL EXAMINATION: General appearance: alert, cooperative, fatigued, and no distress Head: Normocephalic, without obvious abnormality, atraumatic Neck: no adenopathy, no JVD, supple, symmetrical, trachea midline, and thyroid not enlarged, symmetric, no tenderness/mass/nodules Lymph nodes: Cervical, supraclavicular, and axillary nodes normal. Resp: clear to auscultation bilaterally Back: symmetric, no curvature. ROM  normal. No CVA tenderness. Cardio: regular rate and rhythm, S1, S2 normal, no murmur, click, rub or gallop GI: soft, non-tender; bowel sounds normal; no masses,  no organomegaly Extremities: extremities normal, atraumatic, no cyanosis or edema Neurologic: Alert and oriented X 3, normal strength and tone. Normal symmetric reflexes. Normal coordination and gait  ECOG PERFORMANCE STATUS: 2 - Symptomatic, <50% confined to bed  Blood pressure 126/72, pulse 81, temperature 97.7 F (36.5 C), temperature source Temporal, resp. rate 15, height 5\' 9"  (1.753 m), weight 133 lb (60.3 kg), SpO2 94 %.  LABORATORY DATA: Lab Results  Component Value Date   WBC 6.1 07/04/2022   HGB 7.9 (L) 07/04/2022   HCT 25.8 (L) 07/04/2022   MCV 95.2 07/04/2022   PLT 282 07/04/2022      Chemistry      Component Value Date/Time   NA 137 07/04/2022 0602   K 3.8 07/04/2022 0602   CL 102 07/04/2022 0602   CO2 25 07/04/2022 0602   BUN 32 (H) 07/04/2022 0602   CREATININE 0.63 07/04/2022 0602   CREATININE 1.09 06/04/2022 1152      Component Value Date/Time   CALCIUM 8.9 07/04/2022 0602   ALKPHOS 104 06/12/2022 0330   AST 24 06/12/2022 0330   AST 83 (H) 06/04/2022 1152   ALT 21 06/12/2022 0330   ALT 46 (H) 06/04/2022 1152   BILITOT 0.5 06/12/2022 0330   BILITOT 0.8 06/04/2022 1152       RADIOGRAPHIC STUDIES: CT Chest W Contrast  Result Date: 07/23/2022 CLINICAL DATA:  Non-small cell lung cancer restaging * Tracking Code: BO * EXAM: CT CHEST WITH CONTRAST TECHNIQUE: Multidetector CT imaging of the chest was performed during intravenous contrast administration. RADIATION DOSE REDUCTION: This exam was performed according to the departmental dose-optimization program which includes automated exposure control, adjustment of the mA and/or kV according to patient size and/or use of iterative reconstruction technique. CONTRAST:  75mL OMNIPAQUE IOHEXOL 300 MG/ML  SOLN COMPARISON:  05/03/2022 FINDINGS: Cardiovascular:  Aortic atherosclerosis. Normal heart size. Three-vessel coronary artery calcifications. No pericardial effusion. Mediastinum/Nodes: No enlarged mediastinal, hilar, or axillary lymph nodes. Thyroid gland, trachea, and esophagus demonstrate no significant findings. Lungs/Pleura: Status post left upper lobectomy. Mild, predominantly paraseptal emphysema. Multiple small nodules of the right lung are unchanged, including a 0.7 cm nodule of the  right pulmonary apex (series 5, image 25) and multiple small nodules adjacent to a thin walled cyst in the peripheral right lower lobe measuring up to 0.7 cm (series 5, image 119). Numerous additional tiny clustered centrilobular and tree-in-bud nodules throughout the bilateral lung bases, left-greater-than-right, with associated dependent bronchial wall thickening and plugging (series 5, image 121). No pleural effusion or pneumothorax. Upper Abdomen: No acute abnormality. Musculoskeletal: No chest wall abnormality. No acute osseous findings. Disc degenerative disease and bridging osteophytosis throughout the thoracic spine. IMPRESSION: 1. Status post left upper lobectomy. 2. Multiple small nodules of the right lung are unchanged, including a 0.7 cm nodule of the right pulmonary apex and multiple small nodules adjacent to a thin walled cyst in the peripheral right lower lobe measuring up to 0.7 cm. Continued attention on follow-up. 3. Numerous additional tiny clustered centrilobular and tree-in-bud nodules throughout the bilateral lung bases, left-greater-than-right, with associated dependent bronchial wall thickening and plugging. Findings are consistent with atypical infection or aspiration. 4. Emphysema. 5. Coronary artery disease. Aortic Atherosclerosis (ICD10-I70.0) and Emphysema (ICD10-J43.9). Electronically Signed   By: Jearld Lesch M.D.   On: 07/23/2022 10:44   IR Fluoro Rm 30-60 Min  Result Date: 07/02/2022 CLINICAL DATA:  72 year old with severe malnutrition and  evaluation for percutaneous gastrostomy tube placement EXAM: IR FLUORO RM 0-60 MIN ANESTHESIA/SEDATION: None MEDICATIONS: None CONTRAST:  None FLUOROSCOPY: Radiation Exposure Index (as provided by the fluoroscopic device): 13 mGy Kerma PROCEDURE: Patient was placed supine on the interventional table. Air was injected through the patient's existing nasogastric tube. Fluoroscopy was used to look for a safe percutaneous window for tube placement. A percutaneous window could not be identified. Therefore, a gastrostomy tube procedure was not performed. COMPLICATIONS: None immediate FINDINGS: Nasogastric tube tip in the distal stomach body region. Large amount of bowel in the left upper abdomen. Based on the lateral view, this bowel is anterior to the stomach and there is not a safe percutaneous window for gastrostomy tube placement. IMPRESSION: No safe percutaneous window for gastrostomy tube placement. Therefore, gastrostomy tube placement was not attempted. Electronically Signed   By: Richarda Overlie M.D.   On: 07/02/2022 17:33   DG Abd 1 View  Result Date: 07/01/2022 CLINICAL DATA:  Feeding tube placement. EXAM: ABDOMEN - 1 VIEW COMPARISON:  06/30/2022. FINDINGS: The bowel gas pattern is normal. A feeding tube terminates in the gastric fundus. No radio-opaque calculi or other acute radiographic abnormality are seen. IMPRESSION: Feeding tube terminates in the gastric fundus. Electronically Signed   By: Thornell Sartorius M.D.   On: 07/01/2022 01:00   DG Abd 1 View  Result Date: 06/30/2022 CLINICAL DATA:  Feeding tube placement EXAM: ABDOMEN - 1 VIEW COMPARISON:  06/20/2022 FINDINGS: Feeding tube has been withdrawn, weighted tip overlies the upper esophagus on 1 of the views. Nonobstructed gas pattern. IMPRESSION: Feeding tube tip overlies the upper esophagus on 1 of the views. These results will be called to the ordering clinician or representative by the Radiologist Assistant, and communication documented in the PACS or  [employer]. Electronically Signed   By: Jasmine Pang M.D.   On: 06/30/2022 23:22   CT ABDOMEN WO CONTRAST  Result Date: 06/27/2022 CLINICAL DATA:  anatomy eval for possible G tube placement EXAM: CT ABDOMEN WITHOUT CONTRAST TECHNIQUE: Multidetector CT imaging of the abdomen was performed following the standard protocol without IV contrast. RADIATION DOSE REDUCTION: This exam was performed according to the departmental dose-optimization program which includes automated exposure control, adjustment of the mA  and/or kV according to patient size and/or use of iterative reconstruction technique. COMPARISON:  CT AP, 04/26/2022 and 03/24/2021 FINDINGS: Lower chest: Calcified coronary atherosclerosis. 1.7 cm peripheral RIGHT basilar pulmonary cyst. Interval development of bibasilar dependent tree-in-bud nodularities. Hepatobiliary: Normal noncontrast appearance of the liver without focal abnormality. No gallstones, gallbladder wall thickening, or biliary dilatation. Pancreas: No pancreatic ductal dilatation or surrounding inflammatory changes. Spleen: Normal in size without focal abnormality. Adrenals/Urinary Tract: Adrenal glands are unremarkable. Trace bilateral perirenal stranding. Bilateral punctate nonobstructing nephrolithiasis. No hydronephrosis. Stomach/Bowel: Small bore enteric feeding tube, with tip within the antral stomach. Stomach mildly distended and is within normal limits. Mild prominence of air distended small bowel loop medial to the stomach. Distal small bowel anastomosis, incompletely assessed on this evaluation. No bowel obstruction. Vascular/Lymphatic: Moderate-to-severe burden of aortic atherosclerosis without aneurysmal dilatation. No enlarged abdominal lymph nodes. Other: Scaphoid abdomen.  No abdominal wall hernia or abnormality. Musculoskeletal: Multilevel degenerative changes of imaged thoracolumbar spine. No acute osseous findings. IMPRESSION: 1. New, bibasilar tree-in-bud pulmonary  nodularities, suspicious for multifocal pneumonia. 2. Anatomy is potentially amenable for percutaneous gastrostomy placement, in consideration of mildly prominent air distended bowel loop medial to the stomach. Final decision making per performing clinician. Consider preprocedural PO contrast opacification of bowel and colon for better visualization. 3. Gastric placement of enteric feeding tube. 4. Nonobstructing nephrolithiasis and aortic Atherosclerosis (ICD10-I70.0). Additional incidental, chronic and senescent findings as above. Roanna Banning, MD [facility] Electronically Signed   By: Roanna Banning M.D.   On: 06/27/2022 10:28    ASSESSMENT AND PLAN: This is a very pleasant 72 years old white male recently diagnosed with a stage IIb (T1 a, N1, M0) non-small cell lung cancer, adenocarcinoma in June 2021 status post left upper lobectomy with lymph node dissection on December 16, 2019 under the care of Dr. Cliffton Asters.  The patient has no actionable mutations and PD-L1 expression was 1%. He was also diagnosed with pelvic GIST tumor. The patient completed a course of adjuvant systemic chemotherapy with cisplatin 75 mg/M2 and Alimta 500 mg/M2 every 3 weeks status post 4 cycles.  He tolerated his treatment well except for fatigue and occasional nausea. For the pelvic GIST tumor, the patient completed neoadjuvant treatment with imatinib 400 mg p.o. daily in April 2023.  Status post 18 months.   The patient underwent surgical resection of the remaining gastrointestinal stromal tumor under the care of Dr. Michaell Cowing on December 14, 2021 with very minimal residual disease.  He resumed his treatment with imatinib and tolerating it fairly well. Repeat CT scan of the chest for restaging of his lung cancer. There was increased density of soft tissue in the left hilar region concerning for possible developing lymphadenopathy or disease recurrence.  A PET scan was performed  recently and that showed new hypermetabolic left axillary, mediastinal and left hilar adenopathy compatible with recurrent metastatic lung cancer no hypermetabolic metastatic disease in the neck, abdomen, pelvis or skeleton. He underwent excisional biopsy of the left axillary lymph node that was not conclusive for malignancy. The patient was seen by Dr. Tonia Brooms and repeat video bronchoscopy with EBUS on April 11, 2022 and biopsy from the station 7 lymph node showed recurrent adenocarcinoma. I had a lengthy discussion with the patient today about his current condition and treatment options. I recommended for the patient a course of concurrent chemoradiation with weekly carboplatin for AUC of 2 and paclitaxel 45 Mg/M2 for 6-7 weeks.  Status post 3 cycles of chemotherapy  but he completed the course of radiation.  The patient was admitted to the hospital on June 07, 2022 with significant sepsis and Klebsiella pneumonia bacteremia.  He also had several other issues during his hospitalization including metabolic encephalopathy as well as acute kidney injury and respiratory failure as well as stroke and dysphagia.  He spent almost 5 weeks in the hospital before discharge to skilled nursing facility.  He is currently using PEG tube for feeding overnight. He is recovering slowly and feeling a little bit better but sleeps a lot. He had repeat CT scan of the chest performed recently.  I personally and independently reviewed the scan and discussed the result with the patient and his wife. His scan showed no concerning findings for disease progression except for persistent few bilateral small pulmonary nodules that need close monitoring. I recommended for the patient to continue on observation for now with repeat CT scan of the chest in 3 months.  I do not think the patient will be a good candidate for consolidation immunotherapy with his current condition. For the GIST, the patient will continue on his treatment  with imatinib 400 mg p.o. daily for now. He was advised to call immediately if he has any concerning symptoms in the interval.  The patient voices understanding of current disease status and treatment options and is in agreement with the current care plan.  All questions were answered. The patient knows to call the clinic with any problems, questions or concerns. We can certainly see the patient much sooner if necessary. The total time spent in the appointment was 30 minutes.  Disclaimer: This note was dictated with voice recognition software. Similar sounding words can inadvertently be transcribed and may not be corrected upon review.

## 2022-07-24 NOTE — Progress Notes (Signed)
Nutrition follow-up completed with patient and wife.  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  He was discharged from nursing home on Osmolite 1.5 nocturnal feeding at 85 mL/h for 16 hours.  CURRENT THERAPY: Imatinib 400 mg p.o. daily (for GIST)   Weight improved and was documented as 133 pounds April 9 increased from 128 pounds 8 ounces March 21.  Noted labs: Glucose 100, BUN 30, albumin 3.2.  Patient is consuming 4 FiberCon daily.  He is taking Imodium.  Wife discontinued Questran.  They were told it was okay to resume medicine for heartburn.  Patient's wife reported she changed continuous feedings to bolus feedings because she wanted patient to be on a "normal "schedule.  He has been tolerating 5 cartons of Osmolite 1.5 bolus for 2 days.  He is drinking 3 cartons Ensure complete and has been trying to eat some food.  Patient reports diarrhea which ranges in intensity and volume.  He occasionally has constipation.  Reports diarrhea began prior to feeding tube placement and occurred after ileostomy and ileostomy takedown in December 2023.  Questions include tube feeding formula change, PEG care, and Enterade.  Estimated nutrition needs: 2000-2300 cal, 85-100 g protein, greater than 2 L fluid.  Nutrition diagnosis: Inadequate oral intake continues.  Intervention: Discussed following a low fiber diet by mouth to stabilize diarrhea.  Encouraged bananas, white rice, applesauce, apple no skin, and toast.  Provided nutrition fact sheets. Provided samples of Banatrol and educated on using up to 3 times daily. Change Ensure complete to Ensure Plus high-protein.   Provided 1 complementary case of chocolate Ensure plus high-protein. Will try to substitute Jevity 1.5 for Osmolite 1.5 gradually up to 3 cartons daily.  Recommended start with 1 carton Jevity 1.5 in place of 1 carton Osmolite 1.5 and increase  by 1 carton every other day until goal of 3 cartons Jevity 1.5+3 cartons of Osmolite 1.5 are tolerated. Provided sample case of Jevity 1.5.  If patient tolerates combination, new tube feeding orders will be written Hold Enterade until tube feeding and oral intake are established and regulated.  This product needs to be taken by itself 30 minutes before or 60 minutes after a meal or other food.  Once tube feeding and oral intake established, will consider adding Enterade back into regimen. Educated on care of PEG tube.  Encouraged daily cleaning of PEG along with dressing change.  Provided samples of split gauze, tape, syringes, and mesh  "binder". Questions were answered.  Monitoring, evaluation, goals: Patient will tolerate tube feeding plus oral intake to meet estimated nutrition needs  Next visit: Monday, April 15, after MD visit.  **Disclaimer: This note was dictated with voice recognition software. Similar sounding words can inadvertently be transcribed and this note may contain transcription errors which may not have been corrected upon publication of note.**

## 2022-07-24 NOTE — Progress Notes (Signed)
Palliative Medicine [facility]  [phone] [phone]   Name: CANE DUBRAY Date: 07/24/2022 MRN: [id number]  DOB: Aug 28, 1950  Patient Care Team: [name], MD as PCP - General (Family Medicine) Si Gaul, MD as Consulting Physician (Oncology) Verner Chol, [facility] (Inactive) as Pharmacist (Pharmacist) Iva Boop, MD as Consulting Physician (Gastroenterology) Josephine Igo, DO as Consulting Physician (Pulmonary Disease) Sebastian Ache, MD as Consulting Physician (Urology) Karie Soda, MD as Consulting Physician (General Surgery)    REASON FOR CONSULTATION: OLANDA DOWNIE is a 72 y.o. 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 Previous medical history also includes hypertension, hyperlipidemia, GERD, anemia, COPD, and macular degeneration and a stroke during most recent hospitalization. Surgical history includes ileostomy and takedown. Peg tube placed (07/04/2022) for esophageal stricture/dysphagia. Palliative asked to see for symptom management and goals of care.    SOCIAL HISTORY:    Mr. Purcell Mouton reports that he quit smoking about 2 years ago. His smoking use included cigarettes. He has a 20.00 pack-year smoking history. He has never used smokeless tobacco. He reports current alcohol use of about 15.0 standard drinks of alcohol per week. He reports that he does not currently use drugs after having used the following drugs: Marijuana.  ADVANCE DIRECTIVES:  Advanced directives on file  CODE STATUS: Full code  PAST MEDICAL HISTORY: Past Medical History:  Diagnosis Date   Anemia    Cancer    bladder cancer   COPD (chronic obstructive pulmonary disease)    mild    DIVERTICULOSIS, COLON 01/14/2007   Qualifier: Diagnosis of  By: Marcelyn Ditty RN, Katy Fitch    GASTRITIS, CHRONIC  06/01/2004   Qualifier: Diagnosis of  By: Creta Levin CMA (AAMA), Robin     GERD 01/09/2007   Qualifier: Diagnosis of  By: Tawanna Cooler, RN, Ellen     Heart murmur    as a child; no murmur heard 12/14/19   HYPERLIPIDEMIA 01/09/2007   Qualifier: Diagnosis of  By: Tawanna Cooler RN, Alvino Chapel     Hypertension    Lung mass    left upper nodule   Macular degeneration    right eye   NEOPLASM, MALIGNANT, BLADDER, HX OF 2002   Qualifier: Diagnosis of  By: Creta Levin CMA (AAMA), Robin  / no chemo or radiation   OSTEOARTHRITIS 01/14/2007   Qualifier: Diagnosis of  By: Marcelyn Ditty, RN, Katy Fitch - knees   Rectal mass    Reflux esophagitis 06/01/2004   Qualifier: Diagnosis of  By: Creta Levin CMA (AAMA), Robin     TOBACCO USER 02/16/2009   Qualifier: Diagnosis of  By: Amador Cunas  MD, Janett Labella    VITAMIN D DEFICIENCY 06/03/2007   Qualifier: Diagnosis of  By: Amador Cunas  MD, Janett Labella     PAST SURGICAL HISTORY:  Past Surgical History:  Procedure Laterality Date   BIOPSY  06/18/2022   Procedure: BIOPSY;  Surgeon: Shellia Cleverly, DO;  [facility]: [facility];  Service: Gastroenterology;;   BREAST BIOPSY Left 03/16/2022   [place] LT RADIOACTIVE SEED LOC 03/16/2022 [facility]   BRONCHIAL BIOPSY  10/13/2019   Procedure: BRONCHIAL BIOPSIES;  Surgeon: Josephine Igo, DO;  [facility];  Service: Pulmonary;;   BRONCHIAL BRUSHINGS  10/13/2019   Procedure: BRONCHIAL BRUSHINGS;  Surgeon: Josephine Igo, DO;  [facility];  Service: Pulmonary;;   BRONCHIAL NEEDLE ASPIRATION BIOPSY  10/13/2019  Procedure: BRONCHIAL NEEDLE ASPIRATION BIOPSIES;  Surgeon: Josephine IgoIcard, Bradley L, DO;  [facility];  Service: Pulmonary;;   BRONCHIAL NEEDLE ASPIRATION BIOPSY  04/11/2022   Procedure: BRONCHIAL NEEDLE ASPIRATION BIOPSIES;  Surgeon: Josephine IgoIcard, Bradley L, DO;  [facility];  Service: Cardiopulmonary;;   BRONCHIAL WASHINGS  10/13/2019   Procedure: BRONCHIAL WASHINGS;  Surgeon: Josephine IgoIcard, Bradley L, DO;  [facility];  Service: Pulmonary;;   COLONOSCOPY     multiple   DIVERTING ILEOSTOMY N/A 12/14/2021   Procedure: DIVERTING ILEOSTOMY;  Surgeon: Karie SodaGross, Steven, MD;  Location: [facility];  Service: General;  Laterality: N/A;   ENDOBRONCHIAL ULTRASOUND Bilateral 04/11/2022   Procedure: ENDOBRONCHIAL ULTRASOUND;  Surgeon: Josephine IgoIcard, Bradley L, DO;  [facility];  Service: Cardiopulmonary;  Laterality: Bilateral;   ESOPHAGEAL DILATION  06/18/2022   Procedure: ESOPHAGEAL DILATION;  Surgeon: Shellia Cleverlyirigliano, Vito V, DO;  [facility];  Service: Gastroenterology;;   ESOPHAGOGASTRODUODENOSCOPY (EGD) WITH PROPOFOL N/A 06/18/2022   Procedure: ESOPHAGOGASTRODUODENOSCOPY (EGD) WITH PROPOFOL;  Surgeon: Shellia Cleverlyirigliano, Vito V, DO;  [facility];  Service: Gastroenterology;  Laterality: N/A;  Severe dysphagia/odynophagia ?radiation esophagitis versus oral candidiasis   ILEOSTOMY CLOSURE N/A 03/22/2022   Procedure: OPEN TAKEDOWN OF LOOP ILEOSTOMY;  Surgeon: Karie SodaGross, Steven, MD;  Location: [facility];  Service: General;  Laterality: N/A;  GEN w/ERAS PATHWAY LOCAL   INTERCOSTAL NERVE BLOCK Left 12/16/2019   Procedure: INTERCOSTAL NERVE BLOCK;  Surgeon: Corliss SkainsLightfoot, Harrell O, MD;  [facility];  Service: Thoracic;  Laterality: Left;   IR FLUORO RM 30-60 MIN  07/02/2022   KNEE SURGERY  07/20/2008   bilat.   NASAL SEPTUM SURGERY  1995   NODE DISSECTION Left 12/16/2019   Procedure: NODE DISSECTION;  Surgeon: Corliss SkainsLightfoot, Harrell O, MD;  [facility];  Service: Thoracic;  Laterality: Left;   PEG PLACEMENT N/A 07/04/2022   Procedure: PERCUTANEOUS ENDOSCOPIC GASTROSTOMY (PEG) PLACEMENT;  Surgeon: Iva BoopGessner, Carl E, MD;  [facility]: [facility];  Service: Gastroenterology;  Laterality: N/A;   RADIOACTIVE SEED GUIDED AXILLARY SENTINEL LYMPH NODE Left 03/20/2022   Procedure: RADIOACTIVE SEED GUIDED LEFT AXILLARY SENTINEL LYMPH NODE BIOPSY;  Surgeon: Harriette Bouillonornett, Thomas, MD;  [facility];  Service: General;  Laterality: Left;   RECTAL  EXAM UNDER ANESTHESIA N/A 03/22/2022   Procedure: ANORECTAL EXAMINATION UNDER ANESTHESIA;  Surgeon: Karie SodaGross, Steven, MD;  [facility];  Service: General;  Laterality: N/A;   TONSILLECTOMY     TRANSURETHRAL RESECTION OF BLADDER TUMOR  2002   UPPER GASTROINTESTINAL ENDOSCOPY     VIDEO BRONCHOSCOPY  04/11/2022   Procedure: VIDEO BRONCHOSCOPY;  Surgeon: Josephine IgoIcard, Bradley L, DO;  [facility];  Service: Cardiopulmonary;;   VIDEO BRONCHOSCOPY WITH ENDOBRONCHIAL NAVIGATION N/A 10/13/2019   Procedure: VIDEO BRONCHOSCOPY WITH ENDOBRONCHIAL NAVIGATION;  Surgeon: Josephine IgoIcard, Bradley L, DO;  [facility];  Service: Pulmonary;  Laterality: N/A;   WISDOM TOOTH EXTRACTION     XI ROBOTIC ASSISTED LOWER ANTERIOR RESECTION N/A 12/14/2021   Procedure: XI ROBOTIC ASSISTED LOWER ANTERIOR ULTRA LOW RECTOSIGMOID RESECTION, COLOANAL HAND SEWN ANASTOMOSIS, AND BILATERAL TAP BLOCK;  Surgeon: Karie SodaGross, Steven, MD;  Location: [facility];  Service: General;  Laterality: N/A;    HEMATOLOGY/ONCOLOGY HISTORY:  Oncology History  Adenocarcinoma of left lung, stage 2  12/31/2019 Initial Diagnosis   Adenocarcinoma of left lung, stage 2 (HCC)   02/05/2020 - 04/07/2020 Chemotherapy         05/30/2020 Cancer Staging   Staging form: Lung, AJCC 8th Edition - Clinical: Stage IIB (cT1a, cN1, cM0) -  Signed by Si Gaul, MD on 05/30/2020   05/14/2022 -  Chemotherapy   Patient is on Treatment Plan : LUNG Carboplatin + Paclitaxel + XRT q7d     Adenocarcinoma of right lung, stage 2 (Resolved)  01/28/2020 Initial Diagnosis   Adenocarcinoma of right lung, stage 2 (HCC)   05/30/2020 Cancer Staging   Staging form: Lung, AJCC 8th Edition - Clinical: Stage IIB (cT1a, cN1, cM0) - Signed by Si Gaul, MD on 05/30/2020   Malignant neoplasm of lung  04/30/2022 Initial Diagnosis   Recurrent non-small cell lung cancer (HCC)   04/30/2022 Cancer Staging   Staging form: Lung, AJCC 8th Edition - Clinical: cT0, cN2, cM0 -  Signed by Si Gaul, MD on 04/30/2022     ALLERGIES:  has No Known Allergies.  MEDICATIONS:  Current Outpatient Medications  Medication Sig Dispense Refill   acetaminophen (TYLENOL) 500 MG tablet Take 1,000 mg by mouth every 8 (eight) hours as needed for moderate pain.     aspirin 81 MG chewable tablet Chew 1 tablet (81 mg total) by mouth daily.     atorvastatin (LIPITOR) 40 MG tablet Take 1/2 tablet (20 mg total) by mouth daily. 90 tablet 3   Cholecalciferol (VITAMIN D3 PO) Take 2,000 Units by mouth daily.     cholestyramine (QUESTRAN) 4 GM/DOSE powder Take 4 g by mouth 2 (two) times daily.     diphenoxylate-atropine (LOMOTIL) 2.5-0.025 MG tablet Take 2 tablets by mouth 4 (four) times daily as needed for diarrhea or loose stools.     feeding supplement (ENSURE ENLIVE / ENSURE PLUS) LIQD Take 237 mLs by mouth 3 (three) times daily between meals. 237 mL 12   ferrous sulfate 325 (65 FE) MG EC tablet Take 1 tablet (325 mg total) by mouth 2 (two) times daily. 60 tablet 1   imatinib (GLEEVEC) 400 MG tablet Take 1 tablet (400 mg total) by mouth daily. Take with meals and large glass of water.Caution:Chemotherapy. (Patient taking differently: Take 400 mg by mouth at bedtime. Take with meals and large glass of water.Caution:Chemotherapy.) 30 tablet 3   lidocaine (XYLOCAINE) 2 % solution Use as directed 15 mLs in the mouth or throat 4 (four) times daily -  before meals and at bedtime. 420 mL 1   loperamide (IMODIUM A-D) 2 MG tablet Take 4 mg by mouth 4 (four) times daily as needed for diarrhea or loose stools.     metoprolol tartrate (LOPRESSOR) 25 MG tablet Take 1 tablet (25 mg total) by mouth 2 (two) times daily.     mirtazapine (REMERON) 15 MG tablet Take 1 tablet (15 mg total) by mouth at bedtime. 30 tablet 2   Multiple Vitamins-Minerals (PRESERVISION AREDS 2) CAPS Take 1 capsule by mouth 2 (two) times daily.     Nutritional Supplements (FEEDING SUPPLEMENT, OSMOLITE 1.5 CAL,) LIQD Place 1,000  mLs into feeding tube daily. -Continue cyclic tube feed of 16 hours (off 8 hours during the day) -Osmolite 1.5 @ 18mL/hr x16 hours (1600-0800) via NGT -30cc H20 flush Q4hours.  0   ondansetron (ZOFRAN) 4 MG tablet Take 4 mg by mouth every 8 (eight) hours as needed for nausea or vomiting.     polycarbophil (FIBERCON) 625 MG tablet Take 2,500 mg by mouth daily. Taking 4 tabs ( 625 mg each)  daily     prochlorperazine (COMPAZINE) 10 MG tablet Take 1 tablet (10 mg total) by mouth every 6 (six) hours as needed for nausea or vomiting. 30 tablet 0   Protein (  FEEDING SUPPLEMENT, PROSOURCE TF20,) liquid Place 60 mLs into feeding tube daily.     sucralfate (CARAFATE) 1 g tablet Take 1 tablet by mouth 4 times daily -  with meals and at bedtime. 60 tablet 1   No current facility-administered medications for this visit.    VITAL SIGNS: There were no vitals taken for this visit. There were no vitals filed for this visit.  Estimated body mass index is 19.64 kg/m as calculated from the following:   Height as of an earlier encounter on 07/24/22: 5\' 9"  (1.753 m).   Weight as of an earlier encounter on 07/24/22: 60.3 kg.  LABS: CBC:    Component Value Date/Time   WBC 6.9 07/24/2022 1254   WBC 6.1 07/04/2022 0602   HGB 8.5 (L) 07/24/2022 1254   HCT 26.7 (L) 07/24/2022 1254   PLT 270 07/24/2022 1254   MCV 92.4 07/24/2022 1254   NEUTROABS 5.8 07/24/2022 1254   LYMPHSABS 0.5 (L) 07/24/2022 1254   MONOABS 0.4 07/24/2022 1254   EOSABS 0.1 07/24/2022 1254   BASOSABS 0.0 07/24/2022 1254   Comprehensive Metabolic Panel:    Component Value Date/Time   NA 137 07/24/2022 1254   K 4.5 07/24/2022 1254   CL 103 07/24/2022 1254   CO2 28 07/24/2022 1254   BUN 30 (H) 07/24/2022 1254   CREATININE 0.70 07/24/2022 1254   GLUCOSE 100 (H) 07/24/2022 1254   CALCIUM 9.1 07/24/2022 1254   AST 26 07/24/2022 1254   ALT 27 07/24/2022 1254   ALKPHOS 116 07/24/2022 1254   BILITOT 0.3 07/24/2022 1254   PROT 6.8  07/24/2022 1254   ALBUMIN 3.2 (L) 07/24/2022 1254    RADIOGRAPHIC STUDIES: CT Chest W Contrast  Result Date: 07/23/2022 CLINICAL DATA:  Non-small cell lung cancer restaging * Tracking Code: BO * EXAM: CT CHEST WITH CONTRAST TECHNIQUE: Multidetector CT imaging of the chest was performed during intravenous contrast administration. RADIATION DOSE REDUCTION: This exam was performed according to the departmental dose-optimization program which includes automated exposure control, adjustment of the mA and/or kV according to patient size and/or use of iterative reconstruction technique. CONTRAST:  75mL OMNIPAQUE IOHEXOL 300 MG/ML  SOLN COMPARISON:  05/03/2022 FINDINGS: Cardiovascular: Aortic atherosclerosis. Normal heart size. Three-vessel coronary artery calcifications. No pericardial effusion. Mediastinum/Nodes: No enlarged mediastinal, hilar, or axillary lymph nodes. Thyroid gland, trachea, and esophagus demonstrate no significant findings. Lungs/Pleura: Status post left upper lobectomy. Mild, predominantly paraseptal emphysema. Multiple small nodules of the right lung are unchanged, including a 0.7 cm nodule of the right pulmonary apex (series 5, image 25) and multiple small nodules adjacent to a thin walled cyst in the peripheral right lower lobe measuring up to 0.7 cm (series 5, image 119). Numerous additional tiny clustered centrilobular and tree-in-bud nodules throughout the bilateral lung bases, left-greater-than-right, with associated dependent bronchial wall thickening and plugging (series 5, image 121). No pleural effusion or pneumothorax. Upper Abdomen: No acute abnormality. Musculoskeletal: No chest wall abnormality. No acute osseous findings. Disc degenerative disease and bridging osteophytosis throughout the thoracic spine. IMPRESSION: 1. Status post left upper lobectomy. 2. Multiple small nodules of the right lung are unchanged, including a 0.7 cm nodule of the right pulmonary apex and multiple small  nodules adjacent to a thin walled cyst in the peripheral right lower lobe measuring up to 0.7 cm. Continued attention on follow-up. 3. Numerous additional tiny clustered centrilobular and tree-in-bud nodules throughout the bilateral lung bases, left-greater-than-right, with associated dependent bronchial wall thickening and plugging. Findings are consistent with  atypical infection or aspiration. 4. Emphysema. 5. Coronary artery disease. Aortic Atherosclerosis (ICD10-I70.0) and Emphysema (ICD10-J43.9). Electronically Signed   By: Jearld Lesch M.D.   On: 07/23/2022 10:44   PERFORMANCE STATUS (ECOG) : 1 - Symptomatic but completely ambulatory  Physical Exam: General: NAD, thin appearance Cardiovascular: regular rate and rhythm Pulmonary: even and unlabored, clear ant fields Abdomen: soft, nontender, + bowel sounds Extremities: no joint deformities, no edema Skin: scattered bruises and petechiae Neurological: alert and oriented with some forgetfulness, appropriate mood and affect  IMPRESSION: This is Mr. Delmastro initial visit with Palliative at the Cancer Center. He was recently hospitalized and followed by our team during his admission. Mr. Yack presents to clinic today with [name] of nearly 37 years. Also seen by the Oncology team today.   I introduced myself, Nikki NP, Maygan RN, and Palliative's role in collaboration with the oncology team. Concept of Palliative Care was introduced as specialized medical care for people and their families living with serious illness.  It focuses on providing relief from the symptoms and stress of a serious illness.  The goal is to improve quality of life for both the patient and the family. Values and goals of care important to patient and family were attempted to be elicited.   Mr. Burges has 2 children living, 3 grandchildren living, and 2 great grandchildren. There are two dogs in the home, [place] and [address]. Mr. Rackow enjoys being outside and hopes  to get feeling well enough to do enjoy the outdoors more often.   Mr. Eichorst relates how difficult the past couple of months have been. Reports having very little memory of his hospitalization 05/2022 in which he had sepsis and strokes. Following hospitalization, he spent time in rehab. For this visit presents in a wheelchair. Uses a walker to ambulate within the home. Has been home only a little over 1 week and continues trying to adjust.  Appetite/intake Wife reports that patient is receiving 5-6 cartons of Osmolite 1.5 per day. Initially, this was administered over 12 hours/day via kangaroo pump. Wife felt that this was too confining for patient and yesterday began giving feedings via bolus method. Reports patient has tolerated this well without N/V. They do have Compazine on hand in the event of N/V.  Education provided and wife verbalizes understanding of monitoring for tube feed intolerance - symptoms such as nausea, vomiting, gas, or abdominal cramping. She also verbalizes understanding of aspiration precautions, such as keeping head elevated following feedings. There is a hospital bed in the home for this purpose.  In addition to tube feedings, patient has begun taking in small amounts of home-cooked meals. He is tolerating foods such as eggs and macaroni and cheese. Endorses some heartburn. Wife reports that Omeprazole was discontinued at some point during patient's hospitalization/rehab and she is not sure of the reason. Restarting Omeprazole and hopeful for improvement in this area.   Has appointment with dietician today for further education and tips on nutrition with a g-tube.   2.  Constipation/diarrhea Mr. Sherman fluctuates between having constipation and diarrhea. More frequently, he has diarrhea. Reports that he gets up 4-5 times per night with diarrhea. Uses Imodium for diarrhea.  Education provided to allow at least one day before switching between laxatives and  anti-diarrheals.   3.  Sleep disturbance Patient typically wakes up several times a night with diarrhea. Reports taking 3-4 naps per day about an hour in length to compensate. Does feel like this is slowly getting  better since leaving the hospital and rehab. Takes Remeron at bedtime.  Will continue to monitor sleep in the setting of new dietary changes, bowel irregularities, and adjusting to home environment.   4.  Pain Mr. Herz reports that he has intermittent back pain from "lying in the bed too long." He denies any cancer-related pain at this time. Uses Tylenol occasionally and feels that this is adequate for his pain relief.   5.   Goals of care  Mr. Rockey wishes to be a FULL CODE and pursue treatments that can improve his level of health and functioning. He has HCPOA listing his wife as Management consultant, and Living Will documents on file. Patient and wife interested in MOST form to help consolidate wishes onto one form that could be honored by medical providers/EMS in the event of an emergency. They are taking form home for review and to decide if they would like to complete at next visit.  We discussed Mr. Huntress current illness and what it means in the larger context of his on-going co-morbidities. Natural disease trajectory and expectations were discussed. We discussed the importance of continued conversation with family and their medical providers regarding overall plan of care and treatment options, ensuring decisions are within the context of the patients values and GOCs.  PLAN: Established therapeutic relationship. Education provided on palliative's role in collaboration with their Oncology team. Appreciate dietician's involvement/recommendations for the management of tube feedings and incorporation of oral feedings. Educated on aspiration precautions. Compazine 10 mg PO/per tube every 6 hours as needed for nausea. Recommend at least one-two days between use of laxatives and  antidiarrheals. Continue nightly Remeron. Monitor sleep pattern in setting of changes to diet, bowel pattern, and adjustment to home environment. Continue Tylenol 1,000 mg every 8 hours as needed for pain. Will monitor pain closely. Patient and wife aware to call for changes/needs. Palliative will plan to see patient back in 2-4 weeks in collaboration to other oncology appointments.    Patient expressed understanding and was in agreement with this plan. He also understands that He can call the clinic at any time with any questions, concerns, or complaints.   Thank you for your referral and allowing Palliative to assist in Mr. Calven Sanker Pant's care.   Number and complexity of problems addressed: HIGH - 1 or more chronic illnesses with SEVERE exacerbation, progression, or side effects of treatment - advanced cancer, pain. Any controlled substances utilized were prescribed in the context of palliative care.  I assessed patient with Marylene Land, NP Student. Agree with above findings.   Visit consisted of counseling and education dealing with the complex and emotionally intense issues of symptom management and palliative care in the setting of serious and potentially life-threatening illness.Greater than 50%  of this time was spent counseling and coordinating care related to the above assessment and plan.  Signed by: Katy Apo, RN MSN [facility] / NP Student   Signed by: Willette Alma, AGPCNP-BC Palliative Medicine Team/[facility]

## 2022-07-25 ENCOUNTER — Encounter: Payer: Self-pay | Admitting: Internal Medicine

## 2022-07-27 ENCOUNTER — Other Ambulatory Visit: Payer: Self-pay

## 2022-07-27 ENCOUNTER — Ambulatory Visit: Payer: Medicare Other | Admitting: Nurse Practitioner

## 2022-07-27 ENCOUNTER — Encounter: Payer: Self-pay | Admitting: Radiation Oncology

## 2022-07-29 NOTE — Progress Notes (Signed)
  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   Recurrent non-small cell lung cancer, adenocarcinoma with positive mediastinal lymph node   Intent: Curative  Radiation Treatment Dates: 05/15/2022 through 06/28/2022 Site Technique Total Dose (Gy) Dose per Fx (Gy) Completed Fx Beam Energies  Mediastinum: Chest 3D 60/60 2 30/30 6X   Narrative: Towards the end of his treatment patient developed significant esophageal issues.  His medical situation was complicated by development of bilateral pneumonia, embolic strokes requiring prolonged hospitalization.  He did have a feeding tube placed to aid with nutrition and eventually had a percutaneous gastrostomy tube placed.  Plan: The patient will follow-up with radiation oncology in one month .  ________________________________________________ -----------------------------------  Billie Lade, PhD, MD  This document serves as a record of services personally performed by Antony Blackbird, MD. It was created on his behalf by Neena Rhymes, a trained medical scribe. The creation of this record is based on the scribe's personal observations and the provider's statements to them. This document has been checked and approved by the attending provider.

## 2022-07-29 NOTE — Progress Notes (Signed)
Radiation Oncology         [phone]) [phone] ________________________________  Name: Jesse [name] MRN: [id number]  Date: 07/30/2022  DOB: 11/03/1950  Follow-Up Visit Note  CC: [name], MD  Si Gaul, MD  No diagnosis found.  Diagnosis: The encounter diagnosis was Recurrent non-small cell lung cancer (HCC).   Recurrent non-small cell lung cancer, adenocarcinoma with positive mediastinal lymph node    Interval Since Last Radiation: 1 month and 1 day   Intent: Curative  Radiation Treatment Dates: 05/15/2022 through 06/28/2022 Site Technique Total Dose (Gy) Dose per Fx (Gy) Completed Fx Beam Energies  Mediastinum: Chest 3D 60/60 2 30/30 6X   Narrative:  The patient returns today for routine follow-up. The patient tolerated radiation therapy relatively well without any significant side effects other than radiation esophagitis. The patient remained admitted through out the remainder of radiation therapy.  To review, the patient was hospitalized from 06/05/22 through 07/06/22. His presentation included acute metabolic encephalopathy and hypoxia. Chest x-ray performed in the Ed revealed pneumonia, and further work-up revealed Klebsiella bacteremia and a small embolic stroke. His hospital course was further complicated by reduced EF, poor oral intake, and radiation esophagitis requiring tube feeding (PEG tube placed on 03/20). He also underwent an EGD on 06/18/22 for further evaluation of dysphagia and odynophagia. Procedural findings included erythematous, decreased vascular pattern mucosa in the esophagus, mild Schatzki ring (dilated with an 18 mm TTS), and a 2 cm hiatal hernia. Esophageal biopsy collected showed no evidence of malignancy with exudate and inflamed granulation tissue consistent with ulcer.    After his lengthy hospital stay, the patient was discharged to a SNF. He required PEG feedings overnight.   Imaging performed while inpatient is detailed as follows:  -- CT  of the head without contrast on 06/07/22 showed no acute intracranial abnormalities (in the absence of contrast).  -- MRI of the brain with and without contrast showed scattered small acute infarcts in the supratentorial and infratentorial brain without hemorrhage or mass effect (possibly embolic in etiology). No evidence of intracranial metastatic disease was appreciated.  -- CTA of the head and neck on 06/11/22 showed: moderate atheromatous changes about the carotid bifurcations without greater than 50% stenosis; outpouching extending laterally (measuring 2 mm) from the proximal cavernous left ICA, suspicious for a small aneurysm; parenchymal opacities within the partially visualized right lung, suspicious for pneumonia; right greater than left layering bilateral pleural effusions, and a few stable small pulmonary nodules measuring up to 7 mm at the right lung apex. -- CT of the abdomen on 06/27/22 showed new, bibasilar tree-in-bud pulmonary nodularities, suspicious for multifocal pneumonia (patient underwent g-tube placement at this time).   Since that time, the patient presented for a follow-up chest CT on 07/23/22 which demonstrated: Multiple small stable nodules in the right lung, including a 0.7 cm nodule of the right pulmonary apex and multiple small nodules adjacent to a thin walled cyst in the peripheral right lower lobe measuring up to 0.7 cm. CT also showed numerous additional tiny clustered centrilobular and tree-in-bud nodules throughout the bilateral lung bases, left-greater-than-right, with associated dependent bronchial wall thickening and plugging (consistent with atypical infection or aspiration).   Dr. Arbutus Ped reviewed CT findings with the patient and recommends that he continue on observation for now with repeat imaging of the chest in 3 months. In light of his current condition, Dr. Arbutus Ped does not recommend immunotherapy.  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 daily for now.  Of note: the patient recently met with palliative medicine and wishes to remain full code and continue to pursue treatments that can improve his quality of life.      Allergies:  has No Known Allergies.  Meds: Current Outpatient Medications  Medication Sig Dispense Refill   acetaminophen (TYLENOL) 500 MG tablet Take 1,000 mg by mouth every 8 (eight) hours as needed for moderate pain.     aspirin 81 MG chewable tablet Chew 1 tablet (81 mg total) by mouth daily.     atorvastatin (LIPITOR) 40 MG tablet Take 1/2 tablet (20 mg total) by mouth daily. 90 tablet 3   Cholecalciferol (VITAMIN D3 PO) Take 2,000 Units by mouth daily.     cholestyramine (QUESTRAN) 4 GM/DOSE powder Take 4 g by mouth 2 (two) times daily.     diphenoxylate-atropine (LOMOTIL) 2.5-0.025 MG tablet Take 2 tablets by mouth 4 (four) times daily as needed for diarrhea or loose stools.     feeding supplement (ENSURE ENLIVE / ENSURE PLUS) LIQD Take 237 mLs by mouth 3 (three) times daily between meals. 237 mL 12   ferrous sulfate 325 (65 FE) MG EC tablet Take 1 tablet (325 mg total) by mouth 2 (two) times daily. 60 tablet 1   imatinib (GLEEVEC) 400 MG tablet Take 1 tablet (400 mg total) by mouth daily. Take with meals and large glass of water.Caution:Chemotherapy. (Patient taking differently: Take 400 mg by mouth at bedtime. Take with meals and large glass of water.Caution:Chemotherapy.) 30 tablet 3   lidocaine (XYLOCAINE) 2 % solution Use as directed 15 mLs in the mouth or throat 4 (four) times daily -  before meals and at bedtime. 420 mL 1   loperamide (IMODIUM A-D) 2 MG tablet Take 4 mg by mouth 4 (four) times daily as needed for diarrhea or loose stools.     metoprolol tartrate (LOPRESSOR) 25 MG tablet Take 1 tablet (25 mg total) by mouth 2 (two) times daily.     mirtazapine (REMERON) 15 MG tablet Take 1 tablet (15 mg total) by mouth at bedtime. 30 tablet 2   Multiple  Vitamins-Minerals (PRESERVISION AREDS 2) CAPS Take 1 capsule by mouth 2 (two) times daily.     Nutritional Supplements (FEEDING SUPPLEMENT, OSMOLITE 1.5 CAL,) LIQD Place 1,000 mLs into feeding tube daily. -Continue cyclic tube feed of 16 hours (off 8 hours during the day) -Osmolite 1.5 @ 72mL/hr x16 hours (1600-0800) via NGT -30cc H20 flush Q4hours.  0   ondansetron (ZOFRAN) 4 MG tablet Take 4 mg by mouth every 8 (eight) hours as needed for nausea or vomiting.     polycarbophil (FIBERCON) 625 MG tablet Take 2,500 mg by mouth daily. Taking 4 tabs ( 625 mg each)  daily     prochlorperazine (COMPAZINE) 10 MG tablet Take 1 tablet (10 mg total) by mouth every 6 (six) hours as needed for nausea or vomiting. 30 tablet 0   Protein (FEEDING SUPPLEMENT, PROSOURCE TF20,) liquid Place 60 mLs into feeding tube daily.     sucralfate (CARAFATE) 1 g tablet Take 1 tablet by mouth 4 times daily -  with meals and at bedtime. 60 tablet 1   No current facility-administered medications for this encounter.    Physical Findings: The patient is in no acute distress. Patient is alert and oriented.  vitals were not taken for this visit. .  No significant changes. Lungs are clear to auscultation bilaterally. Heart has regular rate and rhythm.  No palpable cervical, supraclavicular, or axillary adenopathy. Abdomen soft, non-tender, normal bowel sounds.   Lab Findings: Lab Results  Component Value Date   WBC 6.9 07/24/2022   HGB 8.5 (L) 07/24/2022   HCT 26.7 (L) 07/24/2022   MCV 92.4 07/24/2022   PLT 270 07/24/2022    Radiographic Findings: CT Chest W Contrast  Result Date: 07/23/2022 CLINICAL DATA:  Non-small cell lung cancer restaging * Tracking Code: BO * EXAM: CT CHEST WITH CONTRAST TECHNIQUE: Multidetector CT imaging of the chest was performed during intravenous contrast administration. RADIATION DOSE REDUCTION: This exam was performed according to the departmental dose-optimization program which includes  automated exposure control, adjustment of the mA and/or kV according to patient size and/or use of iterative reconstruction technique. CONTRAST:  75mL OMNIPAQUE IOHEXOL 300 MG/ML  SOLN COMPARISON:  05/03/2022 FINDINGS: Cardiovascular: Aortic atherosclerosis. Normal heart size. Three-vessel coronary artery calcifications. No pericardial effusion. Mediastinum/Nodes: No enlarged mediastinal, hilar, or axillary lymph nodes. Thyroid gland, trachea, and esophagus demonstrate no significant findings. Lungs/Pleura: Status post left upper lobectomy. Mild, predominantly paraseptal emphysema. Multiple small nodules of the right lung are unchanged, including a 0.7 cm nodule of the right pulmonary apex (series 5, image 25) and multiple small nodules adjacent to a thin walled cyst in the peripheral right lower lobe measuring up to 0.7 cm (series 5, image 119). Numerous additional tiny clustered centrilobular and tree-in-bud nodules throughout the bilateral lung bases, left-greater-than-right, with associated dependent bronchial wall thickening and plugging (series 5, image 121). No pleural effusion or pneumothorax. Upper Abdomen: No acute abnormality. Musculoskeletal: No chest wall abnormality. No acute osseous findings. Disc degenerative disease and bridging osteophytosis throughout the thoracic spine. IMPRESSION: 1. Status post left upper lobectomy. 2. Multiple small nodules of the right lung are unchanged, including a 0.7 cm nodule of the right pulmonary apex and multiple small nodules adjacent to a thin walled cyst in the peripheral right lower lobe measuring up to 0.7 cm. Continued attention on follow-up. 3. Numerous additional tiny clustered centrilobular and tree-in-bud nodules throughout the bilateral lung bases, left-greater-than-right, with associated dependent bronchial wall thickening and plugging. Findings are consistent with atypical infection or aspiration. 4. Emphysema. 5. Coronary artery disease. Aortic  Atherosclerosis (ICD10-I70.0) and Emphysema (ICD10-J43.9). Electronically Signed   By: Jearld Lesch M.D.   On: 07/23/2022 10:44   IR Fluoro Rm 30-60 Min  Result Date: 07/02/2022 CLINICAL DATA:  72 year old with severe malnutrition and evaluation for percutaneous gastrostomy tube placement EXAM: IR FLUORO RM 0-60 MIN ANESTHESIA/SEDATION: None MEDICATIONS: None CONTRAST:  None FLUOROSCOPY: Radiation Exposure Index (as provided by the fluoroscopic device): 13 mGy Kerma PROCEDURE: Patient was placed supine on the interventional table. Air was injected through the patient's existing nasogastric tube. Fluoroscopy was used to look for a safe percutaneous window for tube placement. A percutaneous window could not be identified. Therefore, a gastrostomy tube procedure was not performed. COMPLICATIONS: None immediate FINDINGS: Nasogastric tube tip in the distal stomach body region. Large amount of bowel in the left upper abdomen. Based on the lateral view, this bowel is anterior to the stomach and there is not a safe percutaneous window for gastrostomy tube placement. IMPRESSION: No safe percutaneous window for gastrostomy tube placement. Therefore, gastrostomy tube placement was not attempted. Electronically Signed   By: Richarda Overlie M.D.   On: 07/02/2022 17:33   DG Abd 1 View  Result Date: 07/01/2022 CLINICAL DATA:  Feeding tube placement. EXAM: ABDOMEN - 1 VIEW COMPARISON:  06/30/2022. FINDINGS: The bowel gas pattern is normal.  A feeding tube terminates in the gastric fundus. No radio-opaque calculi or other acute radiographic abnormality are seen. IMPRESSION: Feeding tube terminates in the gastric fundus. Electronically Signed   By: Thornell Sartorius M.D.   On: 07/01/2022 01:00   DG Abd 1 View  Result Date: 06/30/2022 CLINICAL DATA:  Feeding tube placement EXAM: ABDOMEN - 1 VIEW COMPARISON:  06/20/2022 FINDINGS: Feeding tube has been withdrawn, weighted tip overlies the upper esophagus on 1 of the views.  Nonobstructed gas pattern. IMPRESSION: Feeding tube tip overlies the upper esophagus on 1 of the views. These results will be called to the ordering clinician or representative by the Radiologist Assistant, and communication documented in the PACS or [employer]. Electronically Signed   By: Jasmine Pang M.D.   On: 06/30/2022 23:22    Impression: The encounter diagnosis was Recurrent non-small cell lung cancer (HCC).   Recurrent non-small cell lung cancer, adenocarcinoma with positive mediastinal lymph node    The patient is recovering from the effects of radiation.  ***  Plan:  ***   *** minutes of total time was spent for this patient encounter, including preparation, face-to-face counseling with the patient and coordination of care, physical exam, and documentation of the encounter. ____________________________________  Billie Lade, PhD, MD  This document serves as a record of services personally performed by Antony Blackbird, MD. It was created on his behalf by Neena Rhymes, a trained medical scribe. The creation of this record is based on the scribe's personal observations and the provider's statements to them. This document has been checked and approved by the attending provider.

## 2022-07-30 ENCOUNTER — Other Ambulatory Visit: Payer: Self-pay

## 2022-07-30 ENCOUNTER — Encounter: Payer: Self-pay | Admitting: Radiation Oncology

## 2022-07-30 ENCOUNTER — Ambulatory Visit: Payer: Medicare Other | Admitting: Nutrition

## 2022-07-30 ENCOUNTER — Ambulatory Visit
Admission: RE | Admit: 2022-07-30 | Discharge: 2022-07-30 | Disposition: A | Discharge status: Home / Self Care | Payer: Medicare Other | Source: Ambulatory Visit | Attending: Radiation Oncology | Admitting: Radiation Oncology

## 2022-07-30 VITALS — BP 116/80 | HR 100 | Resp 20 | Wt 135.2 lb

## 2022-07-30 DIAGNOSIS — C771 Secondary and unspecified malignant neoplasm of intrathoracic lymph nodes: Secondary | ICD-10-CM | POA: Insufficient documentation

## 2022-07-30 DIAGNOSIS — Z8701 Personal history of pneumonia (recurrent): Secondary | ICD-10-CM | POA: Diagnosis not present

## 2022-07-30 DIAGNOSIS — Z923 Personal history of irradiation: Secondary | ICD-10-CM | POA: Diagnosis not present

## 2022-07-30 DIAGNOSIS — C3490 Malignant neoplasm of unspecified part of unspecified bronchus or lung: Secondary | ICD-10-CM | POA: Diagnosis not present

## 2022-07-30 DIAGNOSIS — Z8673 Personal history of transient ischemic attack (TIA), and cerebral infarction without residual deficits: Secondary | ICD-10-CM | POA: Insufficient documentation

## 2022-07-30 DIAGNOSIS — C3492 Malignant neoplasm of unspecified part of left bronchus or lung: Secondary | ICD-10-CM

## 2022-07-30 HISTORY — DX: Personal history of irradiation: Z92.3

## 2022-07-30 NOTE — Progress Notes (Signed)
Jesse [name] is here today for follow up post radiation to the lung.  Lung Side: Lung cancer treatment 05-15-22 thu 06/28/22  Does the patient complain of any of the following: Pain:Denies at radiation site Shortness of breath w/wo exertion: denies Cough: denies Hemoptysis: denies Pain with swallowing:  sometime Swallowing/choking concerns: sometime Appetite: not good Energy Level: Not good Post radiation skin Changes:  Denies any issues with skin at site    Additional comments if applicable: Patient states he has feeding tube. Osmolite 4 per day.  Jenvatie x2 per day. 16-24 ozs orally.daily. Vitals:   07/30/22 1129  BP: 116/80  Pulse: 100  Resp: 20  SpO2: 98%  Weight: 61.3 kg

## 2022-07-30 NOTE — Progress Notes (Signed)
Nutrition follow-up completed with patient and wife.  Patient has been diagnosed with recurrent non-small cell lung cancer. 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  He completed radiation therapy approximately 1 month ago.  Patient is receiving imatinib for GIST.  Weight documented as 135 pounds 4 ounces on April 15 which is improved from 133 pounds April 9.  No recent labs.  Estimated nutrition needs: 2000-2300 cal, 85-100 g protein, greater than 2 L fluid.  Patient is tolerating tube feeding without change in episodes and or severity of diarrhea/constipation.  He has no nausea or vomiting.  He is tolerating slow advancement of Jevity 1.5 to a total of 3 cartons daily.  Goal bolus regimen would be 1 carton Jevity 1.5+1 carton Osmolite 1.5 3 times a day.  Flushes his feeding tube with 60 mL free water before and after bolus feeds.  Patient is drinking 1 Enterade every morning prior to giving first bolus feeding.  Diet consists of bananas and applesauce along with some other soft bland foods.  He has not tried H[employer]icult[employer], commercial yet.  No other concerns mentioned.  Patient's wife has some questions regarding timing of medications.  She reports she will discuss with GI physician and an upcoming appointment.  Nutrition diagnosis: Inadequate oral intake continues.  Intervention: Continue increase of Jevity 1.5-3 cartons daily +3 cartons Osmolite 1.5 daily to provide 2130 cal, 90 g protein, 1443 mL free water.  This meets estimated nutrition needs. Continue 1 bottle Enterade daily as tolerated. Continue soft diet trying to incorporate stool thickening foods. 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 Provided 2 cases of Jevity 1.5.  Plan to write orders for tube feeding change once tolerance is established.  Monitoring, evaluation, goals: Patient will tolerate tube feeding  plus oral intake to meet estimated nutrition needs to promote weight gain and minimize weight loss.  Next visit: To be scheduled after meeting with GI physician.

## 2022-07-31 ENCOUNTER — Telehealth: Payer: Self-pay | Admitting: Internal Medicine

## 2022-07-31 NOTE — Telephone Encounter (Signed)
PT wife is calling about peg tube. He is also having severe diarrhea and would like for a nurse to return his call.

## 2022-08-01 ENCOUNTER — Ambulatory Visit (INDEPENDENT_AMBULATORY_CARE_PROVIDER_SITE_OTHER): Payer: Medicare Other | Admitting: Family Medicine

## 2022-08-01 VITALS — BP 112/68 | HR 115 | Temp 98.0°F | Wt 136.2 lb

## 2022-08-01 DIAGNOSIS — K208 Other esophagitis without bleeding: Secondary | ICD-10-CM | POA: Diagnosis not present

## 2022-08-01 DIAGNOSIS — C3492 Malignant neoplasm of unspecified part of left bronchus or lung: Secondary | ICD-10-CM

## 2022-08-01 DIAGNOSIS — K9429 Other complications of gastrostomy: Secondary | ICD-10-CM

## 2022-08-01 DIAGNOSIS — E43 Unspecified severe protein-calorie malnutrition: Secondary | ICD-10-CM | POA: Diagnosis not present

## 2022-08-01 DIAGNOSIS — D508 Other iron deficiency anemias: Secondary | ICD-10-CM

## 2022-08-01 DIAGNOSIS — Z8673 Personal history of transient ischemic attack (TIA), and cerebral infarction without residual deficits: Secondary | ICD-10-CM

## 2022-08-01 DIAGNOSIS — N179 Acute kidney failure, unspecified: Secondary | ICD-10-CM | POA: Diagnosis not present

## 2022-08-01 DIAGNOSIS — C3490 Malignant neoplasm of unspecified part of unspecified bronchus or lung: Secondary | ICD-10-CM | POA: Diagnosis not present

## 2022-08-01 DIAGNOSIS — K529 Noninfective gastroenteritis and colitis, unspecified: Secondary | ICD-10-CM

## 2022-08-01 DIAGNOSIS — I10 Essential (primary) hypertension: Secondary | ICD-10-CM | POA: Diagnosis not present

## 2022-08-01 DIAGNOSIS — C49A5 Gastrointestinal stromal tumor of rectum: Secondary | ICD-10-CM | POA: Diagnosis not present

## 2022-08-01 DIAGNOSIS — T66XXXA Radiation sickness, unspecified, initial encounter: Secondary | ICD-10-CM | POA: Diagnosis not present

## 2022-08-01 MED ORDER — AMLODIPINE BESYLATE 5 MG PO TABS: 5.0000 mg | ORAL_TABLET | Freq: Every day | ORAL | 3 refills | Status: DC

## 2022-08-01 MED ORDER — FERROUS SULFATE 325 (65 FE) MG PO TBEC: 325.0000 mg | DELAYED_RELEASE_TABLET | Freq: Two times a day (BID) | ORAL | 1 refills | Status: DC

## 2022-08-01 MED ORDER — SUCRALFATE 1 G PO TABS: 1.0000 g | ORAL_TABLET | Freq: Three times a day (TID) | ORAL | 11 refills | Status: DC

## 2022-08-01 NOTE — Telephone Encounter (Signed)
I spoke with the pt wife and we discussed the PEG tube and diarrhea. She has contacted the oncologist in regards to the PEG.  We also discussed the diarrhea.  She has spoken with oncology and is currently at the PCP office for diarrhea.  He also has an appt with Dr Leone Payor for 7/3.  They will keep that appt and let us know if we are needed in the meantime after seeing PCP today.

## 2022-08-01 NOTE — Progress Notes (Addendum)
Established Patient Office Visit   Subjective  Patient ID: Jesse [name], male    DOB: [birth year]  Age: 72 y.o. MRN: [id number]  Chief Complaint  Patient presents with   Follow-up    Discharged from rehab.   Patient is accompanied by [name].  Pt is a 72 yo male with pmh sig for lung cancer, bladder cancer, GIST, HLD, HTN, CVA, CAD, GERD, OA who presents for f/u.  Pt hospitalized 2/20-3/22 for sepsis 2/2 Klebsiella pneumonia, metabolic encephalopathy, radiation esophagitis.  During hospitalization PEG tube placed.  Patient discharged to [facility] place for rehab.  Since getting out of rehab p's wife with medication questions/concerns.  Notes out of metoprolol 25 mg twice daily.  Taking Norvasc 5 mg daily.  BP controlled.  Patient with severe protein calorie malnutrition.  Using PEG and taking food by mouth.  On Osmolite and Jevity 3 times daily, 30 mL free water flushes.  Pt with frequent stools, pasty in consistency, at times difficult to pass.  Patient endorses skin irritation due to frequent stools.  Tried numerous OTC barrier creams but they do not stay in place.  Using banana flakes and fibercon.    Past Medical History:  Diagnosis Date   Anemia    Cancer    bladder cancer   COPD (chronic obstructive pulmonary disease)    mild    DIVERTICULOSIS, COLON 01/14/2007   Qualifier: Diagnosis of  By: Marcelyn Ditty RN, Katy Fitch    GASTRITIS, CHRONIC 06/01/2004   Qualifier: Diagnosis of  By: Creta Levin CMA (AAMA), Robin     GERD 01/09/2007   Qualifier: Diagnosis of  By: Tawanna Cooler, RN, Ellen     Heart murmur    as a child; no murmur heard 12/14/19   History of radiation therapy    Mediastinum- 05/15/22-06/28/22-Dr. Antony Blackbird   HYPERLIPIDEMIA 01/09/2007   Qualifier: Diagnosis of  By: Tawanna Cooler RN, Alvino Chapel     Hypertension    Lung mass    left upper nodule   Macular degeneration    right eye   NEOPLASM, MALIGNANT, BLADDER, HX OF 2002   Qualifier: Diagnosis of  By: Creta Levin CMA (AAMA), Robin  /  no chemo or radiation   OSTEOARTHRITIS 01/14/2007   Qualifier: Diagnosis of  By: Marcelyn Ditty, RN, Katy Fitch - knees   Rectal mass    Reflux esophagitis 06/01/2004   Qualifier: Diagnosis of  By: Creta Levin CMA (AAMA), Robin     TOBACCO USER 02/16/2009   Qualifier: Diagnosis of  By: Amador Cunas  MD, Janett Labella    VITAMIN D DEFICIENCY 06/03/2007   Qualifier: Diagnosis of  By: Amador Cunas  MD, Janett Labella    Past Surgical History:  Procedure Laterality Date   BIOPSY  06/18/2022   Procedure: BIOPSY;  Surgeon: Shellia Cleverly, DO;  Location: [facility];  Service: Gastroenterology;;   BREAST BIOPSY Left 03/16/2022   [place] LT RADIOACTIVE SEED LOC 03/16/2022 [facility]   BRONCHIAL BIOPSY  10/13/2019   Procedure: BRONCHIAL BIOPSIES;  Surgeon: Josephine Igo, DO;  [facility];  Service: Pulmonary;;   BRONCHIAL BRUSHINGS  10/13/2019   Procedure: BRONCHIAL BRUSHINGS;  Surgeon: Josephine Igo, DO;  [facility];  Service: Pulmonary;;   BRONCHIAL NEEDLE ASPIRATION BIOPSY  10/13/2019   Procedure: BRONCHIAL NEEDLE ASPIRATION BIOPSIES;  Surgeon: Josephine Igo, DO;  [facility];  Service: Pulmonary;;   BRONCHIAL NEEDLE ASPIRATION BIOPSY  04/11/2022   Procedure: BRONCHIAL NEEDLE ASPIRATION BIOPSIES;  Surgeon: Josephine Igo, DO;  [facility]: [facility];  Service: Cardiopulmonary;;   BRONCHIAL WASHINGS  10/13/2019   Procedure: BRONCHIAL WASHINGS;  Surgeon: Josephine Igo, DO;  [facility];  Service: Pulmonary;;   COLONOSCOPY     multiple   DIVERTING ILEOSTOMY N/A 12/14/2021   Procedure: DIVERTING ILEOSTOMY;  Surgeon: Karie Soda, MD;  [facility]: [facility];  Service: General;  Laterality: N/A;   ENDOBRONCHIAL ULTRASOUND Bilateral 04/11/2022   Procedure: ENDOBRONCHIAL ULTRASOUND;  Surgeon: Josephine Igo, DO;  [facility];  Service: Cardiopulmonary;  Laterality: Bilateral;   ESOPHAGEAL DILATION  06/18/2022   Procedure: ESOPHAGEAL DILATION;  Surgeon:  Shellia Cleverly, DO;  [facility];  Service: Gastroenterology;;   ESOPHAGOGASTRODUODENOSCOPY (EGD) WITH PROPOFOL N/A 06/18/2022   Procedure: ESOPHAGOGASTRODUODENOSCOPY (EGD) WITH PROPOFOL;  Surgeon: Shellia Cleverly, DO;  [facility];  Service: Gastroenterology;  Laterality: N/A;  Severe dysphagia/odynophagia ?radiation esophagitis versus oral candidiasis   ILEOSTOMY CLOSURE N/A 03/22/2022   Procedure: OPEN TAKEDOWN OF LOOP ILEOSTOMY;  Surgeon: Karie Soda, MD;  [facility];  Service: General;  Laterality: N/A;  GEN w/ERAS PATHWAY LOCAL   INTERCOSTAL NERVE BLOCK Left 12/16/2019   Procedure: INTERCOSTAL NERVE BLOCK;  Surgeon: Corliss Skains, MD;  [facility];  Service: Thoracic;  Laterality: Left;   IR FLUORO RM 30-60 MIN  07/02/2022   KNEE SURGERY  07/20/2008   bilat.   NASAL SEPTUM SURGERY  1995   NODE DISSECTION Left 12/16/2019   Procedure: NODE DISSECTION;  Surgeon: Corliss Skains, MD;  [facility];  Service: Thoracic;  Laterality: Left;   PEG PLACEMENT N/A 07/04/2022   Procedure: PERCUTANEOUS ENDOSCOPIC GASTROSTOMY (PEG) PLACEMENT;  Surgeon: Iva Boop, MD;  [facility];  Service: Gastroenterology;  Laterality: N/A;   RADIOACTIVE SEED GUIDED AXILLARY SENTINEL LYMPH NODE Left 03/20/2022   Procedure: RADIOACTIVE SEED GUIDED LEFT AXILLARY SENTINEL LYMPH NODE BIOPSY;  Surgeon: Harriette Bouillon, MD;  [facility];  Service: General;  Laterality: Left;   RECTAL EXAM UNDER ANESTHESIA N/A 03/22/2022   Procedure: ANORECTAL EXAMINATION UNDER ANESTHESIA;  Surgeon: Karie Soda, MD;  [facility];  Service: General;  Laterality: N/A;   TONSILLECTOMY     TRANSURETHRAL RESECTION OF BLADDER TUMOR  2002   UPPER GASTROINTESTINAL ENDOSCOPY     VIDEO BRONCHOSCOPY  04/11/2022   Procedure: VIDEO BRONCHOSCOPY;  Surgeon: Josephine Igo, DO;  [facility];  Service: Cardiopulmonary;;   VIDEO BRONCHOSCOPY WITH ENDOBRONCHIAL NAVIGATION N/A  10/13/2019   Procedure: VIDEO BRONCHOSCOPY WITH ENDOBRONCHIAL NAVIGATION;  Surgeon: Josephine Igo, DO;  [facility];  Service: Pulmonary;  Laterality: N/A;   WISDOM TOOTH EXTRACTION     XI ROBOTIC ASSISTED LOWER ANTERIOR RESECTION N/A 12/14/2021   Procedure: XI ROBOTIC ASSISTED LOWER ANTERIOR ULTRA LOW RECTOSIGMOID RESECTION, COLOANAL HAND SEWN ANASTOMOSIS, AND BILATERAL TAP BLOCK;  Surgeon: Karie Soda, MD;  [facility]: [facility];  Service: General;  Laterality: N/A;   Social History   Tobacco Use   Smoking status: Former    Packs/day: 0.50    Years: 40.00    Additional pack years: 0.00    Total pack years: 20.00    Types: Cigarettes    Quit date: 09/21/2019    Years since quitting: 2.8   Smokeless tobacco: Never  Vaping Use   Vaping Use: Never used  Substance Use Topics   Alcohol use: Yes    Alcohol/week: 15.0 standard drinks of alcohol    Types: 15 Standard drinks or equivalent per week  Comment: wine/beer/martini   Drug use: Not Currently    Types: Marijuana    Comment: Smokes marijuana occasional, last use in 08/2019   Family History  Problem Relation Age of Onset   Dementia Mother    Diabetes Father    Mental illness Father    Peripheral vascular disease Brother    No Known Allergies    ROS Negative unless stated above    Objective:     BP 112/68 (BP Location: Left Arm, Patient Position: Sitting, Cuff Size: Normal)   Pulse (!) 115   Temp 98 F (36.7 C) (Oral)   Wt 136 lb 3.2 oz (61.8 kg)   SpO2 99%   BMI 20.11 kg/m  BP Readings from Last 3 Encounters:  08/01/22 112/68  07/30/22 116/80  07/24/22 126/72   Wt Readings from Last 3 Encounters:  08/01/22 136 lb 3.2 oz (61.8 kg)  07/30/22 135 lb 4 oz (61.3 kg)  07/24/22 133 lb (60.3 kg)      Physical Exam Constitutional:      Appearance: He is underweight.  HENT:     Head: Normocephalic and atraumatic.     Right Ear: Tympanic membrane normal.     Left Ear: Tympanic membrane normal.      Nose: Nose normal.     Mouth/Throat:     Mouth: Mucous membranes are moist.  Eyes:     Extraocular Movements: Extraocular movements intact.     Conjunctiva/sclera: Conjunctivae normal.     Pupils: Pupils are equal, round, and reactive to light.  Cardiovascular:     Rate and Rhythm: Normal rate and regular rhythm.     Heart sounds: Normal heart sounds.  Pulmonary:     Effort: Pulmonary effort is normal.     Breath sounds: Normal breath sounds.  Abdominal:     General: Bowel sounds are normal.     Palpations: Abdomen is soft.     Tenderness: There is no abdominal tenderness.       Comments: PEG tube in place.  Stoma with healthy pink granulation tissue.  Slight area of erythema at inferior edge of rectangular shaped PEG tube external bumper.  Neurological:     Mental Status: He is alert.  Psychiatric:        Behavior: Behavior is cooperative.     No results found for any visits on 08/01/22.    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  No current signs of infection such as increased warmth, tenderness, or drainage noted. -Advised to continue keeping site clean and dry using soap and water. -Okay to apply sterile gauze underneath bumper to prevent constant rubbing/irritation. -Follow-up with gastroenterology  Essential hypertension -BP controlled 112/68 -Tachycardia noted due to patient walking through clinic.  Also returned to normal on recheck. -Continue Norvasc 5 mg daily. -Will hold metoprolol 25 mg twice daily to prevent hypotension.  If needed can restart for BP elevation or persistent  tachycardia.. -Monitor BP daily and keep a log to bring with you to clinic. -     amLODIPine Besylate; Take 1 tablet (5 mg total) by mouth daily.  Dispense: 90 tablet; Refill: 3  Protein-calorie malnutrition, severe -Continue PEG tube feeds with Jevity and  Osmolite 3 times daily, 30 cc free water flushes   Recurrent non-small cell lung cancer -Metastatic to lymph nodes on chemoradiation -Continue follow-up with oncology, rad unk, cardiology  Radiation-induced esophagitis -Continue follow-up with GI and heme-onc -     Sucralfate; Take 1 tablet by mouth 4 times daily -  with meals and at bedtime.  Dispense: 120 tablet; Refill: 11  Iron deficiency anemia secondary to inadequate dietary iron intake -hgb 7.4 on 06/10/22 -Advised to continue iron supplement. -Advised iron supplements can cause constipation and dark-colored stools. -     Ferrous Sulfate; Take 1 tablet (325 mg total) by mouth 2 (two) times daily.  Dispense: 60 tablet; Refill: 1  History of ischemic stroke -Incidental finding on MRI brain with without contrast 06/11/2022.  Small scattered acute infarcts in the supratentorial and infratentorial brain without hemorrhage or mass effect, possibly embolic in etiology. -s/p 3 wks DAPT. Continue ASA 81 mg  Acute kidney injury -Resolved -Creatinine 0.70 on 07/24/2022.  Was 1.8 at time of admission.  Baseline around 0.5-0.6 -hydration encouraged.  Return in about 3 months (around 10/31/2022).     [name], MD

## 2022-08-02 ENCOUNTER — Telehealth: Payer: Self-pay | Admitting: Nurse Practitioner

## 2022-08-03 ENCOUNTER — Encounter: Payer: Self-pay | Admitting: Family Medicine

## 2022-08-03 NOTE — Progress Notes (Incomplete)
Established Patient Office Visit   Subjective  Patient ID: Jesse [name], male    DOB: October 10, 1950  Age: 72 y.o. MRN: [id number]  Chief Complaint  Patient presents with  . Follow-up    Discharged from rehab.   Patient is accompanied by [name].  Pt is a 72 yo male with pmh sig for lung cancer, bladder cancer, GIST, HLD, HTN, CVA, CAD, GERD, OA who presents for f/u.  Pt hospitalized 2/20-3/22 for sepsis 2/2 Klebsiella pneumonia, metabolic encephalopathy, radiation esophagitis.  During hospitalization PEG tube placed.  Patient discharged to [facility] place for rehab.  Since getting out of rehab p's wife with medication questions/concerns.  Notes out of metoprolol 25 mg twice daily.  Taking Norvasc 5 mg daily.  BP controlled.  Patient with severe protein calorie malnutrition.  Using PEG and taking food by mouth.  On Osmolite and Jevity 3 times daily, 30 mL free water flushes.  Pt with frequent stools, pasty in consistency, at times difficult to pass.  Patient endorses skin irritation due to frequent stools.  Tried numerous OTC barrier creams but they do not stay in place.  Using banana flakes and fibercon.    Past Medical History:  Diagnosis Date  . Anemia   . Cancer    bladder cancer  . COPD (chronic obstructive pulmonary disease)    mild   . DIVERTICULOSIS, COLON 01/14/2007   Qualifier: Diagnosis of  By: Marcelyn Ditty RN, Katy Fitch   . GASTRITIS, CHRONIC 06/01/2004   Qualifier: Diagnosis of  By: Creta Levin CMA (AAMA), Robin    . GERD 01/09/2007   Qualifier: Diagnosis of  By: Everett Graff    . Heart murmur    as a child; no murmur heard 12/14/19  . History of radiation therapy    Mediastinum- 05/15/22-06/28/22-Dr. Antony Blackbird  . HYPERLIPIDEMIA 01/09/2007   Qualifier: Diagnosis of  By: Everett Graff    . Hypertension   . Lung mass    left upper nodule  . Macular degeneration    right eye  . NEOPLASM, MALIGNANT, BLADDER, HX OF 2002   Qualifier: Diagnosis of  By: Creta Levin CMA  (AAMA), Robin  / no chemo or radiation  . OSTEOARTHRITIS 01/14/2007   Qualifier: Diagnosis of  By: Marcelyn Ditty RN, Katy Fitch - knees  . Rectal mass   . Reflux esophagitis 06/01/2004   Qualifier: Diagnosis of  By: Creta Levin CMA (AAMA), Robin    . TOBACCO USER 02/16/2009   Qualifier: Diagnosis of  By: Amador Cunas  MD, Janett Labella   . VITAMIN D DEFICIENCY 06/03/2007   Qualifier: Diagnosis of  By: Amador Cunas  MD, Janett Labella    Past Surgical History:  Procedure Laterality Date  . BIOPSY  06/18/2022   Procedure: BIOPSY;  Surgeon: Shellia Cleverly, DO;  [facility];  Service: Gastroenterology;;  . BREAST BIOPSY Left 03/16/2022   [place] LT RADIOACTIVE SEED LOC 03/16/2022 [facility]  . BRONCHIAL BIOPSY  10/13/2019   Procedure: BRONCHIAL BIOPSIES;  Surgeon: Josephine Igo, DO;  [facility];  Service: Pulmonary;;  . BRONCHIAL BRUSHINGS  10/13/2019   Procedure: BRONCHIAL BRUSHINGS;  Surgeon: Josephine Igo, DO;  [facility];  Service: Pulmonary;;  . BRONCHIAL NEEDLE ASPIRATION BIOPSY  10/13/2019   Procedure: BRONCHIAL NEEDLE ASPIRATION BIOPSIES;  Surgeon: Josephine Igo, DO;  [facility];  Service: Pulmonary;;  . BRONCHIAL NEEDLE ASPIRATION BIOPSY  04/11/2022   Procedure: BRONCHIAL NEEDLE ASPIRATION BIOPSIES;  Surgeon: Josephine Igo, DO;  [facility];  Service: Cardiopulmonary;;  . BRONCHIAL WASHINGS  10/13/2019   Procedure: BRONCHIAL WASHINGS;  Surgeon: Josephine Igo, DO;  [facility];  Service: Pulmonary;;  . COLONOSCOPY     multiple  . DIVERTING ILEOSTOMY N/A 12/14/2021   Procedure: DIVERTING ILEOSTOMY;  Surgeon: Karie Soda, MD;  [facility];  Service: General;  Laterality: N/A;  . ENDOBRONCHIAL ULTRASOUND Bilateral 04/11/2022   Procedure: ENDOBRONCHIAL ULTRASOUND;  Surgeon: Josephine Igo, DO;  [facility];  Service: Cardiopulmonary;  Laterality: Bilateral;  . ESOPHAGEAL DILATION  06/18/2022   Procedure:  ESOPHAGEAL DILATION;  Surgeon: Shellia Cleverly, DO;  [facility];  Service: Gastroenterology;;  . ESOPHAGOGASTRODUODENOSCOPY (EGD) WITH PROPOFOL N/A 06/18/2022   Procedure: ESOPHAGOGASTRODUODENOSCOPY (EGD) WITH PROPOFOL;  Surgeon: Shellia Cleverly, DO;  [facility];  Service: Gastroenterology;  Laterality: N/A;  Severe dysphagia/odynophagia ?radiation esophagitis versus oral candidiasis  . ILEOSTOMY CLOSURE N/A 03/22/2022   Procedure: OPEN TAKEDOWN OF LOOP ILEOSTOMY;  Surgeon: Karie Soda, MD;  [facility];  Service: General;  Laterality: N/A;  GEN w/ERAS PATHWAY LOCAL  . INTERCOSTAL NERVE BLOCK Left 12/16/2019   Procedure: INTERCOSTAL NERVE BLOCK;  Surgeon: Corliss Skains, MD;  [facility];  Service: Thoracic;  Laterality: Left;  . IR FLUORO RM 30-60 MIN  07/02/2022  . KNEE SURGERY  07/20/2008   bilat.  Marland Kitchen NASAL SEPTUM SURGERY  1995  . NODE DISSECTION Left 12/16/2019   Procedure: NODE DISSECTION;  Surgeon: Corliss Skains, MD;  [facility];  Service: Thoracic;  Laterality: Left;  . PEG PLACEMENT N/A 07/04/2022   Procedure: PERCUTANEOUS ENDOSCOPIC GASTROSTOMY (PEG) PLACEMENT;  Surgeon: Iva Boop, MD;  [facility];  Service: Gastroenterology;  Laterality: N/A;  . RADIOACTIVE SEED GUIDED AXILLARY SENTINEL LYMPH NODE Left 03/20/2022   Procedure: RADIOACTIVE SEED GUIDED LEFT AXILLARY SENTINEL LYMPH NODE BIOPSY;  Surgeon: Harriette Bouillon, MD;  [facility];  Service: General;  Laterality: Left;  . RECTAL EXAM UNDER ANESTHESIA N/A 03/22/2022   Procedure: ANORECTAL EXAMINATION UNDER ANESTHESIA;  Surgeon: Karie Soda, MD;  [facility];  Service: General;  Laterality: N/A;  . TONSILLECTOMY    . TRANSURETHRAL RESECTION OF BLADDER TUMOR  2002  . UPPER GASTROINTESTINAL ENDOSCOPY    . VIDEO BRONCHOSCOPY  04/11/2022   Procedure: VIDEO BRONCHOSCOPY;  Surgeon: Josephine Igo, DO;  [facility];  Service: Cardiopulmonary;;  . VIDEO  BRONCHOSCOPY WITH ENDOBRONCHIAL NAVIGATION N/A 10/13/2019   Procedure: VIDEO BRONCHOSCOPY WITH ENDOBRONCHIAL NAVIGATION;  Surgeon: Josephine Igo, DO;  [facility];  Service: Pulmonary;  Laterality: N/A;  . WISDOM TOOTH EXTRACTION    . XI ROBOTIC ASSISTED LOWER ANTERIOR RESECTION N/A 12/14/2021   Procedure: XI ROBOTIC ASSISTED LOWER ANTERIOR ULTRA LOW RECTOSIGMOID RESECTION, COLOANAL HAND SEWN ANASTOMOSIS, AND BILATERAL TAP BLOCK;  Surgeon: Karie Soda, MD;  [facility];  Service: General;  Laterality: N/A;   Social History   Tobacco Use  . Smoking status: Former    Packs/day: 0.50    Years: 40.00    Additional pack years: 0.00    Total pack years: 20.00    Types: Cigarettes    Quit date: 09/21/2019    Years since quitting: 2.8  . Smokeless tobacco: Never  Vaping Use  . Vaping Use: Never used  Substance Use Topics  . Alcohol use: Yes    Alcohol/week: 15.0 standard drinks of alcohol    Types: 15 Standard drinks or equivalent per week  Comment: wine/beer/martini  . Drug use: Not Currently    Types: Marijuana    Comment: Smokes marijuana occasional, last use in 08/2019   Family History  Problem Relation Age of Onset  . Dementia Mother   . Diabetes Father   . Mental illness Father   . Peripheral vascular disease Brother    No Known Allergies    ROS Negative unless stated above    Objective:     BP 112/68 (BP Location: Left Arm, Patient Position: Sitting, Cuff Size: Normal)   Pulse (!) 115   Temp 98 F (36.7 C) (Oral)   Wt 136 lb 3.2 oz (61.8 kg)   SpO2 99%   BMI 20.11 kg/m  BP Readings from Last 3 Encounters:  08/01/22 112/68  07/30/22 116/80  07/24/22 126/72   Wt Readings from Last 3 Encounters:  08/01/22 136 lb 3.2 oz (61.8 kg)  07/30/22 135 lb 4 oz (61.3 kg)  07/24/22 133 lb (60.3 kg)      Physical Exam Constitutional:      Appearance: He is underweight.  HENT:     Head: Normocephalic and atraumatic.     Right Ear: Tympanic  membrane normal.     Left Ear: Tympanic membrane normal.     Nose: Nose normal.     Mouth/Throat:     Mouth: Mucous membranes are moist.  Eyes:     Extraocular Movements: Extraocular movements intact.     Conjunctiva/sclera: Conjunctivae normal.     Pupils: Pupils are equal, round, and reactive to light.  Cardiovascular:     Rate and Rhythm: Normal rate and regular rhythm.     Heart sounds: Normal heart sounds.  Pulmonary:     Effort: Pulmonary effort is normal.     Breath sounds: Normal breath sounds.  Abdominal:     General: Bowel sounds are normal.     Palpations: Abdomen is soft.     Tenderness: There is no abdominal tenderness.       Comments: PEG tube in place.  Stoma with healthy pink granulation tissue.  Slight area of erythema at inferior edge of rectangular shaped PEG tube external bumper.  Neurological:     Mental Status: He is alert.  Psychiatric:        Behavior: Behavior is cooperative.     No results found for any visits on 08/01/22.    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  No current signs of infection such as increased warmth, tenderness, or drainage noted. -Advised to continue keeping site clean and dry using soap and water. -Okay to apply sterile gauze underneath bumper to prevent constant rubbing/irritation. -Follow-up with gastroenterology  Essential hypertension -BP controlled 112/68 -Tachycardia noted due to patient walking through clinic.  Also returned to normal on recheck. -Continue Norvasc 5 mg daily. -Will hold metoprolol 25 mg twice daily to prevent hypotension.   If needed can restart for BP elevation or persistent tachycardia.. -Monitor BP daily and keep a log to bring with you to clinic. -     amLODIPine Besylate; Take 1 tablet (5 mg total) by mouth daily.  Dispense: 90 tablet; Refill: 3  Protein-calorie malnutrition, severe -Continue PEG tube feeds with Jevity and  Osmolite 3 times daily, 30 cc free water flushes   Recurrent non-small cell lung cancer -Metastatic to lymph nodes on chemoradiation -Continue follow-up with oncology, rad unk, cardiology  Radiation-induced esophagitis -     Sucralfate; Take 1 tablet by mouth 4 times daily -  with meals and at bedtime.  Dispense: 120 tablet; Refill: 11  Iron deficiency anemia secondary to inadequate dietary iron intake -     Ferrous Sulfate; Take 1 tablet (325 mg total) by mouth 2 (two) times daily.  Dispense: 60 tablet; Refill: 1  History of ischemic stroke -Incidental finding on MRI brain with without contrast 06/11/2022.  Small scattered acute infarcts in the supratentorial and infratentorial brain without hemorrhage or mass effect, possibly embolic in etiology. -s/p 3 wks DAPT. Continue ASA 81 mg  Return in about 3 months (around 10/31/2022).   [name], MD

## 2022-08-06 ENCOUNTER — Other Ambulatory Visit (HOSPITAL_BASED_OUTPATIENT_CLINIC_OR_DEPARTMENT_OTHER): Payer: Self-pay

## 2022-08-06 ENCOUNTER — Encounter (HOSPITAL_BASED_OUTPATIENT_CLINIC_OR_DEPARTMENT_OTHER): Payer: Self-pay

## 2022-08-06 ENCOUNTER — Other Ambulatory Visit: Payer: Self-pay

## 2022-08-06 ENCOUNTER — Emergency Department (HOSPITAL_BASED_OUTPATIENT_CLINIC_OR_DEPARTMENT_OTHER): Payer: Medicare Other

## 2022-08-06 ENCOUNTER — Telehealth: Payer: Self-pay | Admitting: Medical Oncology

## 2022-08-06 ENCOUNTER — Emergency Department (HOSPITAL_BASED_OUTPATIENT_CLINIC_OR_DEPARTMENT_OTHER)
Admission: EM | Admit: 2022-08-06 | Discharge: 2022-08-06 | Disposition: A | Discharge status: Home / Self Care | Payer: Medicare Other | Attending: Emergency Medicine | Admitting: Emergency Medicine

## 2022-08-06 ENCOUNTER — Encounter: Payer: Self-pay | Admitting: Internal Medicine

## 2022-08-06 DIAGNOSIS — Z85118 Personal history of other malignant neoplasm of bronchus and lung: Secondary | ICD-10-CM | POA: Diagnosis not present

## 2022-08-06 DIAGNOSIS — N132 Hydronephrosis with renal and ureteral calculous obstruction: Secondary | ICD-10-CM | POA: Diagnosis not present

## 2022-08-06 DIAGNOSIS — N200 Calculus of kidney: Secondary | ICD-10-CM | POA: Diagnosis not present

## 2022-08-06 DIAGNOSIS — N134 Hydroureter: Secondary | ICD-10-CM | POA: Diagnosis not present

## 2022-08-06 DIAGNOSIS — R109 Unspecified abdominal pain: Secondary | ICD-10-CM | POA: Diagnosis not present

## 2022-08-06 DIAGNOSIS — I700 Atherosclerosis of aorta: Secondary | ICD-10-CM | POA: Diagnosis not present

## 2022-08-06 LAB — URINALYSIS, ROUTINE W REFLEX MICROSCOPIC
Bacteria, UA: NONE SEEN
Bilirubin Urine: NEGATIVE
Glucose, UA: NEGATIVE mg/dL
Ketones, ur: NEGATIVE mg/dL
Leukocytes,Ua: NEGATIVE
Nitrite: NEGATIVE
RBC / HPF: >50 RBC/hpf (ref 0–5)
Specific Gravity, Urine: 1.024 (ref 1.005–1.030)
pH: 7.5 (ref 5.0–8.0)

## 2022-08-06 LAB — CBC WITH DIFFERENTIAL/PLATELET
Abs Immature Granulocytes: 0.01 10*3/uL (ref 0.00–0.07)
Basophils Absolute: 0 10*3/uL (ref 0.0–0.1)
Basophils Relative: 0 %
Eosinophils Absolute: 0.1 10*3/uL (ref 0.0–0.5)
Eosinophils Relative: 2 %
HCT: 28.6 % — ABNORMAL LOW (ref 39.0–52.0)
Hemoglobin: 8.8 g/dL — ABNORMAL LOW (ref 13.0–17.0)
Immature Granulocytes: 0 %
Lymphocytes Relative: 6 %
Lymphs Abs: 0.3 10*3/uL — ABNORMAL LOW (ref 0.7–4.0)
MCH: 29.8 pg (ref 26.0–34.0)
MCHC: 30.8 g/dL (ref 30.0–36.0)
MCV: 96.9 fL (ref 80.0–100.0)
Monocytes Absolute: 0.3 10*3/uL (ref 0.1–1.0)
Monocytes Relative: 6 %
Neutro Abs: 3.9 10*3/uL (ref 1.7–7.7)
Neutrophils Relative %: 86 %
Platelets: 218 10*3/uL (ref 150–400)
RBC: 2.95 MIL/uL — ABNORMAL LOW (ref 4.22–5.81)
RDW: 18.6 % — ABNORMAL HIGH (ref 11.5–15.5)
WBC: 4.6 10*3/uL (ref 4.0–10.5)
nRBC: 0 % (ref 0.0–0.2)

## 2022-08-06 LAB — BASIC METABOLIC PANEL
Anion gap: 8 (ref 5–15)
BUN: 26 mg/dL — ABNORMAL HIGH (ref 8–23)
CO2: 28 mmol/L (ref 22–32)
Calcium: 9 mg/dL (ref 8.9–10.3)
Chloride: 99 mmol/L (ref 98–111)
Creatinine, Ser: 0.69 mg/dL (ref 0.61–1.24)
GFR, Estimated: >60 mL/min (ref >60)
Glucose, Bld: 134 mg/dL — ABNORMAL HIGH (ref 70–99)
Potassium: 4.2 mmol/L (ref 3.5–5.1)
Sodium: 135 mmol/L (ref 135–145)

## 2022-08-06 LAB — LIPASE, BLOOD: Lipase: 30 U/L (ref 11–51)

## 2022-08-06 MED ORDER — IOHEXOL 300 MG/ML  SOLN
100.0000 mL | Freq: Once | INTRAMUSCULAR | Status: AC | PRN
  Administered 2022-08-06: 80 mL via INTRAVENOUS

## 2022-08-06 MED ORDER — ONDANSETRON HCL 4 MG/2ML IJ SOLN
4.0000 mg | Freq: Once | INTRAMUSCULAR | Status: DC
  Filled 2022-08-06: qty 2

## 2022-08-06 NOTE — Discharge Instructions (Signed)
The CT scan today showed you have a kidney stone is 3 mm that recently passed into the bladder.  This is likely the source of your symptoms.  The site around the feeding tube does not look infected.  CT scan did show that your recent pneumonia has improved.  No other concerning findings on the CT scan.  The area of lump that you feel right underneath the incision site is likely scar tissue.  For any concerning or worsening symptoms please return to the emergency room.  Your CT scan did show increased stool burden.  I will discuss with your gastroenterologist regarding bowel regimen options.  Particularly recommendations on your cholestyramine.

## 2022-08-06 NOTE — Telephone Encounter (Signed)
Per Wife -Pt has hard spot near feeding tube red ,tender and he vomited breakfast today. When I called her back pt was in the ED. She said he is getting a CT scan.

## 2022-08-06 NOTE — ED Provider Notes (Signed)
[place] EMERGENCY DEPARTMENT AT [facility] Provider Note   CSN: [id number] Arrival date & time: 08/06/22  1242     History  Chief Complaint  Patient presents with   Emesis    Jesse [name] is a 72 y.o. male.  72 year old male with past medical history of lung cancer, malignant GI stromal tumor who presents today for evaluation of mild abdominal discomfort, hard lump on the right side of his abdomen, associated with nausea and vomiting.  States this started since last night.  Prior to that he has never felt a hard lump.  Denies fever, difficulty urinating, back pain, hematemesis, chest pain, shortness of breath.  He states he ate breakfast normally this morning.  No other complaints.  Feeding tube in place.  Patient uses both feeding tube and normal p.o. intake.  The history is provided by the patient. No language interpreter was used.       Home Medications Prior to Admission medications   Medication Sig Start Date End Date Taking? Authorizing Provider  acetaminophen (TYLENOL) 500 MG tablet Take 1,000 mg by mouth every 8 (eight) hours as needed for moderate pain.    [provider]  amLODipine (NORVASC) 5 MG tablet Take 1 tablet (5 mg total) by mouth daily. 08/01/22   [name], MD  aspirin 81 MG chewable tablet Chew 1 tablet (81 mg total) by mouth daily. 07/06/22   Danford, Earl Lites, MD  atorvastatin (LIPITOR) 40 MG tablet Take 1/2 tablet (20 mg total) by mouth daily. 10/25/21   [name], MD  Cholecalciferol (VITAMIN D3 PO) Take 2,000 Units by mouth daily.    [provider]  cholestyramine Lanetta Inch) 4 GM/DOSE powder Take 4 g by mouth 2 (two) times daily.    [provider]  feeding supplement (ENSURE ENLIVE / ENSURE PLUS) LIQD Take 237 mLs by mouth 3 (three) times daily between meals. 07/06/22   Danford, Earl Lites, MD  ferrous sulfate 325 (65 FE) MG EC tablet Take 1 tablet (325 mg total) by mouth 2 (two) times daily.  08/01/22 08/01/23  [name], MD  imatinib (GLEEVEC) 400 MG tablet Take 1 tablet (400 mg total) by mouth daily. Take with meals and large glass of water.Caution:Chemotherapy. Patient taking differently: Take 400 mg by mouth at bedtime. Take with meals and large glass of water.Caution:Chemotherapy. 03/19/22   Si Gaul, MD  lidocaine (XYLOCAINE) 2 % solution Use as directed 15 mLs in the mouth or throat 4 (four) times daily -  before meals and at bedtime. Patient not taking: Reported on 07/30/2022 07/10/22   Antony Blackbird, MD  loperamide (IMODIUM A-D) 2 MG tablet Take 4 mg by mouth 4 (four) times daily as needed for diarrhea or loose stools.    [provider]  mirtazapine (REMERON) 15 MG tablet Take 1 tablet (15 mg total) by mouth at bedtime. 05/28/22   Heilingoetter, Cassandra L, PA-C  Multiple Vitamins-Minerals (PRESERVISION AREDS 2) CAPS Take 1 capsule by mouth 2 (two) times daily.    [provider]  Nutritional Supplements (FEEDING SUPPLEMENT, OSMOLITE 1.5 CAL,) LIQD Place 1,000 mLs into feeding tube daily. -Continue cyclic tube feed of 16 hours (off 8 hours during the day) -Osmolite 1.5 @ 52mL/hr x16 hours (1600-0800) via NGT -30cc H20 flush Q4hours. 07/06/22   Danford, Earl Lites, MD  prochlorperazine (COMPAZINE) 10 MG tablet Take 1 tablet (10 mg total) by mouth every 6 (six) hours as needed for nausea or vomiting. Patient not taking:  Reported on 07/30/2022 04/30/22   Si Gaul, MD  Protein (FEEDING SUPPLEMENT, PROSOURCE TF20,) liquid Place 60 mLs into feeding tube daily. Patient not taking: Reported on 07/30/2022 07/06/22   Alberteen Sam, MD  sucralfate (CARAFATE) 1 g tablet Take 1 tablet by mouth 4 times daily -  with meals and at bedtime. 08/01/22   [name], MD      Allergies    Patient has no known allergies.    Review of Systems   Review of Systems  Constitutional:  Negative for chills and fever.  Respiratory:  Negative for shortness  of breath.   Cardiovascular:  Negative for chest pain.  Gastrointestinal:  Positive for abdominal pain, diarrhea, nausea and vomiting. Negative for blood in stool and constipation.  Genitourinary:  Negative for difficulty urinating and dysuria.  All other systems reviewed and are negative.   Physical Exam Updated Vital Signs BP (!) 144/81 (BP Location: Right Arm)   Pulse 100   Temp 97.6 F (36.4 C) (Oral)   Resp 18   Ht  (1.753 m)   Wt 61.8 kg   SpO2 100%   BMI 20.12 kg/m  Physical Exam Vitals and nursing note reviewed.  Constitutional:      General: He is not in acute distress.    Appearance: Normal appearance. He is not ill-appearing.  HENT:     Head: Normocephalic and atraumatic.     Nose: Nose normal.  Eyes:     Conjunctiva/sclera: Conjunctivae normal.  Cardiovascular:     Rate and Rhythm: Normal rate and regular rhythm.  Pulmonary:     Effort: Pulmonary effort is normal. No respiratory distress.  Abdominal:     General: There is no distension.     Palpations: Abdomen is soft.     Tenderness: There is no abdominal tenderness. There is no guarding.     Comments: Small area of induration noted in the right lower quadrant.  Overlying this area there is a small incision.  This appears old and well healed.   Musculoskeletal:        General: No deformity.  Skin:    Findings: No rash.  Neurological:     Mental Status: He is alert.     ED Results / Procedures / Treatments   Labs (all labs ordered are listed, but only abnormal results are displayed) Labs Reviewed  CBC WITH DIFFERENTIAL/PLATELET - Abnormal; Notable for the following components:      Result Value   RBC 2.95 (*)    Hemoglobin 8.8 (*)    HCT 28.6 (*)    RDW 18.6 (*)    Lymphs Abs 0.3 (*)    All other components within normal limits  BASIC METABOLIC PANEL - Abnormal; Notable for the following components:   Glucose, Bld 134 (*)    BUN 26 (*)    All other components within normal limits   LIPASE, BLOOD    EKG None  Radiology No results found.  Procedures Procedures    Medications Ordered in ED Medications  ondansetron (ZOFRAN) injection 4 mg (0 mg Intravenous Hold 08/06/22 1337)  iohexol (OMNIPAQUE) 300 MG/ML solution 100 mL (has no administration in time range)    ED Course/ Medical Decision Making/ A&P                             Medical Decision Making Amount and/or Complexity of Data Reviewed Labs: ordered. Radiology: ordered.  Risk  Prescription drug management.   Medical Decision Making / ED Course   This patient presents to the ED for concern of abdominal pain, nausea and vomiting, this involves an extensive number of treatment options, and is a complaint that carries with it a high risk of complications and morbidity.  The differential diagnosis includes bowel obstruction, pancreatitis, UTI, pyelonephritis, nephrolithiasis  MDM: 72 year old male presents with above-mentioned complaint.  He does have history of malignancy.  He is concerned regarding a lump, pain, and nausea and vomiting he had today.  He is overall well-appearing.  Denies any dysuria.  Does have chronic loose stools.  He takes banana flakes, and cholestyramine for this.  Will obtain labs, provide nausea medication, and obtain CT imaging.  CBC without leukocytosis.  Hemoglobin at baseline at 8.8.  BMP with preserved renal function and downtrending BUN.  Electrolytes within normal.  Lipase within normal limits.  UA without evidence of UTI.  CT does show evidence of a millimeter stone that is within the bladder consistent with recently passed stone.  Some inflammatory changes surrounding kidney is likely from the recent kidney stone.  Currently patient is symptom-free.  No prior history of kidney stones.  Will provide urology referral.  No abdominal tenderness particular in the right upper quadrant.  There is a 2 mm stone within the gallbladder.  No signs of inflammatory changes  particularly to raise suspicion for cholecystitis.  CT does show signs of improvement from recent pneumonia.  Increased stool burden noted.  Patient will follow-up with urology.  Patient is appropriate for discharge.  Discharged in stable condition.  Return precautions discussed.  Patient voices understanding and is in agreement with plan.  Lab Tests: -I ordered, reviewed, and interpreted labs.   The pertinent results include:   Labs Reviewed  CBC WITH DIFFERENTIAL/PLATELET - Abnormal; Notable for the following components:      Result Value   RBC 2.95 (*)    Hemoglobin 8.8 (*)    HCT 28.6 (*)    RDW 18.6 (*)    Lymphs Abs 0.3 (*)    All other components within normal limits  BASIC METABOLIC PANEL - Abnormal; Notable for the following components:   Glucose, Bld 134 (*)    BUN 26 (*)    All other components within normal limits  LIPASE, BLOOD      EKG  EKG Interpretation  Date/Time:    Ventricular Rate:    PR Interval:    QRS Duration:   QT Interval:    QTC Calculation:   R Axis:     Text Interpretation:           Imaging Studies ordered: I ordered imaging studies including CT abdomen pelvis with contrast I independently visualized and interpreted imaging. I agree with the radiologist interpretation   Medicines ordered and prescription drug management: Meds ordered this encounter  Medications   ondansetron (ZOFRAN) injection 4 mg   iohexol (OMNIPAQUE) 300 MG/ML solution 100 mL    -I have reviewed the patients home medicines and have made adjustments as needed  Reevaluation: After the interventions noted above, I reevaluated the patient and found that they have :resolved  Co morbidities that complicate the patient evaluation  Past Medical History:  Diagnosis Date   Anemia    Cancer    bladder cancer   COPD (chronic obstructive pulmonary disease)    mild    DIVERTICULOSIS, COLON 01/14/2007   Qualifier: Diagnosis of  By: Marcelyn Ditty RN, Katy Fitch  GASTRITIS,  CHRONIC 06/01/2004   Qualifier: Diagnosis of  By: Creta Levin CMA (AAMA), Robin     GERD 01/09/2007   Qualifier: Diagnosis of  By: Tawanna Cooler, RN, Ellen     Heart murmur    as a child; no murmur heard 12/14/19   History of radiation therapy    Mediastinum- 05/15/22-06/28/22-Dr. 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  Return precaution discussed.  Patient voices understanding and is in agreement with plan.  Final Clinical Impression(s) / ED Diagnoses Final diagnoses:  Nephrolithiasis    Rx / DC Orders ED Discharge Orders     None         Marita Kansas, PA-C 08/06/22 1637    Virgina Norfolk, DO 08/07/22 [phone]

## 2022-08-06 NOTE — ED Triage Notes (Signed)
Patient here POV from Home.  Endorses N/V that began a few hours ago. Noted a "Hard Spot" to Right ABD Pain. No Fevers. No New Diarrhea. Painful to Area.   Feeding Tube in Place.   NAD noted during Triage. A&Ox4. GCS 15. Ambulatory with Dan Humphreys.

## 2022-08-07 ENCOUNTER — Ambulatory Visit: Payer: Medicare Other | Attending: Surgery | Admitting: Physical Therapy

## 2022-08-07 DIAGNOSIS — M62838 Other muscle spasm: Secondary | ICD-10-CM | POA: Diagnosis not present

## 2022-08-07 DIAGNOSIS — R293 Abnormal posture: Secondary | ICD-10-CM | POA: Insufficient documentation

## 2022-08-07 DIAGNOSIS — R279 Unspecified lack of coordination: Secondary | ICD-10-CM | POA: Insufficient documentation

## 2022-08-07 DIAGNOSIS — M6281 Muscle weakness (generalized): Secondary | ICD-10-CM | POA: Insufficient documentation
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
## 2022-08-10 DIAGNOSIS — I6340 Cerebral infarction due to embolism of unspecified cerebral artery: Secondary | ICD-10-CM | POA: Diagnosis not present

## 2022-08-10 DIAGNOSIS — R000 Tachycardia, unspecified: Secondary | ICD-10-CM | POA: Diagnosis not present

## 2022-08-13 ENCOUNTER — Telehealth: Payer: Self-pay | Admitting: *Deleted

## 2022-08-13 DIAGNOSIS — R1314 Dysphagia, pharyngoesophageal phase: Secondary | ICD-10-CM | POA: Diagnosis not present

## 2022-08-13 DIAGNOSIS — G9340 Encephalopathy, unspecified: Secondary | ICD-10-CM | POA: Diagnosis not present

## 2022-08-13 DIAGNOSIS — E43 Unspecified severe protein-calorie malnutrition: Secondary | ICD-10-CM | POA: Diagnosis not present

## 2022-08-13 DIAGNOSIS — Z8673 Personal history of transient ischemic attack (TIA), and cerebral infarction without residual deficits: Secondary | ICD-10-CM | POA: Diagnosis not present

## 2022-08-13 DIAGNOSIS — M6281 Muscle weakness (generalized): Secondary | ICD-10-CM | POA: Diagnosis not present

## 2022-08-13 NOTE — Telephone Encounter (Signed)
Transition Care Management Unsuccessful Follow-up Telephone Call  Date of discharge and from where:  [facility] 08/06/2022  Attempts:  1st Attempt  Reason for unsuccessful TCM follow-up call:  Left voice message   Alois Cliche -[facility] [place], Population Health [phone] [address] [place] , [place] [address] Email : [email or web address]

## 2022-08-14 DIAGNOSIS — M6281 Muscle weakness (generalized): Secondary | ICD-10-CM | POA: Diagnosis not present

## 2022-08-14 DIAGNOSIS — R1314 Dysphagia, pharyngoesophageal phase: Secondary | ICD-10-CM | POA: Diagnosis not present

## 2022-08-14 DIAGNOSIS — G9340 Encephalopathy, unspecified: Secondary | ICD-10-CM | POA: Diagnosis not present

## 2022-08-14 DIAGNOSIS — E43 Unspecified severe protein-calorie malnutrition: Secondary | ICD-10-CM | POA: Diagnosis not present

## 2022-08-14 DIAGNOSIS — Z8673 Personal history of transient ischemic attack (TIA), and cerebral infarction without residual deficits: Secondary | ICD-10-CM | POA: Diagnosis not present

## 2022-08-14 NOTE — Progress Notes (Unsigned)
Palliative Medicine [facility]  [phone] [phone]   Name: Jesse [name] Date: 08/14/2022 MRN: [id number]  DOB: Dec 08, 1950  Patient Care Team: [name], MD as PCP - General (Family Medicine) Si Gaul, MD as Consulting Physician (Oncology) Verner Chol, [facility] (Inactive) as Pharmacist (Pharmacist) Iva Boop, MD as Consulting Physician (Gastroenterology) Josephine Igo, DO as Consulting Physician (Pulmonary Disease) Berneice Heinrich Delbert Phenix., MD as Consulting Physician (Urology) Karie Soda, MD as Consulting Physician (General Surgery)    INTERVAL HISTORY: Jesse [name] is a 72 y.o. 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 Previous medical history also includes hypertension, hyperlipidemia, GERD, anemia, COPD, and macular degeneration and a stroke during most recent hospitalization. Surgical history includes ileostomy and takedown. Peg tube placed (07/04/2022) for esophageal stricture/dysphagia. Palliative asked to see for symptom management and goals of care.   SOCIAL HISTORY:     reports that he quit smoking about 2 years ago. His smoking use included cigarettes. He has a 20.00 pack-year smoking history. He has never used smokeless tobacco. He reports current alcohol use of about 15.0 standard drinks of alcohol per week. He reports that he does not currently use drugs after having used the following drugs: Marijuana.  ADVANCE DIRECTIVES:  Advanced directives on file  CODE STATUS: Full code  PAST MEDICAL HISTORY: Past Medical History:  Diagnosis Date   Anemia    Cancer (HCC)    bladder cancer   COPD (chronic obstructive pulmonary disease) (HCC)    mild    DIVERTICULOSIS, COLON 01/14/2007   Qualifier: Diagnosis of  By: Marcelyn Ditty RN, Katy Fitch    GASTRITIS, CHRONIC  06/01/2004   Qualifier: Diagnosis of  By: Creta Levin CMA (AAMA), Robin     GERD 01/09/2007   Qualifier: Diagnosis of  By: Tawanna Cooler, RN, Ellen     Heart murmur    as a child; no murmur heard 12/14/19   History of radiation therapy    Mediastinum- 05/15/22-06/28/22-Dr. Antony Blackbird   HYPERLIPIDEMIA 01/09/2007   Qualifier: Diagnosis of  By: Everett Graff     Hypertension    Lung mass    left upper nodule   Macular degeneration    right eye   NEOPLASM, MALIGNANT, BLADDER, HX OF 2002   Qualifier: Diagnosis of  By: Creta Levin CMA (AAMA), Robin  / no chemo or radiation   OSTEOARTHRITIS 01/14/2007   Qualifier: Diagnosis of  By: Marcelyn Ditty, RN, Katy Fitch - knees   Rectal mass    Reflux esophagitis 06/01/2004   Qualifier: Diagnosis of  By: Creta Levin CMA (AAMA), Robin     TOBACCO USER 02/16/2009   Qualifier: Diagnosis of  By: Amador Cunas  MD, Janett Labella    VITAMIN D DEFICIENCY 06/03/2007   Qualifier: Diagnosis of  By: Amador Cunas  MD, Janett Labella     ALLERGIES:  has No Known Allergies.  MEDICATIONS:  Current Outpatient Medications  Medication Sig Dispense Refill   acetaminophen (TYLENOL) 500 MG tablet Take 1,000 mg by mouth every 8 (eight) hours as needed for moderate pain.     amLODipine (NORVASC) 5 MG tablet Take 1 tablet (5 mg total) by mouth daily. 90 tablet 3   aspirin 81 MG chewable tablet Chew 1 tablet (81 mg total) by mouth daily.     atorvastatin (LIPITOR) 40 MG tablet Take 1/2 tablet (20 mg total) by mouth  daily. 90 tablet 3   Cholecalciferol (VITAMIN D3 PO) Take 2,000 Units by mouth daily.     cholestyramine (QUESTRAN) 4 GM/DOSE powder Take 4 g by mouth 2 (two) times daily.     feeding supplement (ENSURE ENLIVE / ENSURE PLUS) LIQD Take 237 mLs by mouth 3 (three) times daily between meals. 237 mL 12   ferrous sulfate 325 (65 FE) MG EC tablet Take 1 tablet (325 mg total) by mouth 2 (two) times daily. 60 tablet 1   imatinib (GLEEVEC) 400 MG tablet Take 1 tablet (400 mg total) by mouth daily. Take  with meals and large glass of water.Caution:Chemotherapy. (Patient taking differently: Take 400 mg by mouth at bedtime. Take with meals and large glass of water.Caution:Chemotherapy.) 30 tablet 3   lidocaine (XYLOCAINE) 2 % solution Use as directed 15 mLs in the mouth or throat 4 (four) times daily -  before meals and at bedtime. (Patient not taking: Reported on 07/30/2022) 420 mL 1   loperamide (IMODIUM A-D) 2 MG tablet Take 4 mg by mouth 4 (four) times daily as needed for diarrhea or loose stools.     mirtazapine (REMERON) 15 MG tablet Take 1 tablet (15 mg total) by mouth at bedtime. 30 tablet 2   Multiple Vitamins-Minerals (PRESERVISION AREDS 2) CAPS Take 1 capsule by mouth 2 (two) times daily.     Nutritional Supplements (FEEDING SUPPLEMENT, OSMOLITE 1.5 CAL,) LIQD Place 1,000 mLs into feeding tube daily. -Continue cyclic tube feed of 16 hours (off 8 hours during the day) -Osmolite 1.5 @ 48mL/hr x16 hours (1600-0800) via NGT -30cc H20 flush Q4hours.  0   prochlorperazine (COMPAZINE) 10 MG tablet Take 1 tablet (10 mg total) by mouth every 6 (six) hours as needed for nausea or vomiting. (Patient not taking: Reported on 07/30/2022) 30 tablet 0   Protein (FEEDING SUPPLEMENT, PROSOURCE TF20,) liquid Place 60 mLs into feeding tube daily. (Patient not taking: Reported on 07/30/2022)     sucralfate (CARAFATE) 1 g tablet Take 1 tablet by mouth 4 times daily -  with meals and at bedtime. 120 tablet 11   No current facility-administered medications for this visit.    VITAL SIGNS: There were no vitals taken for this visit. There were no vitals filed for this visit.  Estimated body mass index is 20.12 kg/m as calculated from the following:   Height as of 08/06/22: 5\' 9"  (1.753 m).   Weight as of 08/06/22: 136 lb 3.9 oz (61.8 kg).   PERFORMANCE STATUS (ECOG) : {CHL ONC ECOG [id number]   Physical Exam General: NAD Cardiovascular: regular rate and rhythm Pulmonary: clear ant fields Abdomen: soft,  nontender, + bowel sounds Extremities: no edema, no joint deformities Skin: no rashes Neurological:   IMPRESSION:  Appetite/intake Wife reports that patient is receiving 5-6 cartons of Osmolite 1.5 per day. Initially, this was administered over 12 hours/day via kangaroo pump. Wife felt that this was too confining for patient and yesterday began giving feedings via bolus method. Reports patient has tolerated this well without N/V. They do have Compazine on hand in the event of N/V.   Education provided and wife verbalizes understanding of monitoring for tube feed intolerance - symptoms such as nausea, vomiting, gas, or abdominal cramping. She also verbalizes understanding of aspiration precautions, such as keeping head elevated following feedings. There is a hospital bed in the home for this purpose.   In addition to tube feedings, patient has begun taking in small amounts of home-cooked meals. He is tolerating  foods such as eggs and macaroni and cheese. Endorses some heartburn. Wife reports that Omeprazole was discontinued at some point during patient's hospitalization/rehab and she is not sure of the reason. Restarting Omeprazole and hopeful for improvement in this area.    Has appointment with dietician today for further education and tips on nutrition with a g-tube.     2.  Constipation/diarrhea Mr. Burack fluctuates between having constipation and diarrhea. More frequently, he has diarrhea. Reports that he gets up 4-5 times per night with diarrhea. Uses Imodium for diarrhea.   Education provided to allow at least one day before switching between laxatives and anti-diarrheals.     3.  Sleep disturbance Patient typically wakes up several times a night with diarrhea. Reports taking 3-4 naps per day about an hour in length to compensate. Does feel like this is slowly getting better since leaving the hospital and rehab. Takes Remeron at bedtime.   Will continue to monitor sleep in the  setting of new dietary changes, bowel irregularities, and adjusting to home environment.     4.  Pain Mr. Stearns reports that he has intermittent back pain from "lying in the bed too long." He denies any cancer-related pain at this time. Uses Tylenol occasionally and feels that this is adequate for his pain relief.     5.   Goals of care   4/9- Mr. Dib wishes to be a FULL CODE and pursue treatments that can improve his level of health and functioning. He has HCPOA listing his wife as Management consultant, and Living Will documents on file. Patient and wife interested in MOST form to help consolidate wishes onto one form that could be honored by medical providers/EMS in the event of an emergency. They are taking form home for review and to decide if they would like to complete at next visit.  We discussed Her current illness and what it means in the larger context of Her on-going co-morbidities. Natural disease trajectory and expectations were discussed.  I discussed the importance of continued conversation with family and their medical providers regarding overall plan of care and treatment options, ensuring decisions are within the context of the patients values and GOCs.  PLAN: Established therapeutic relationship. Education provided on palliative's role in collaboration with their Oncology team. Appreciate dietician's involvement/recommendations for the management of tube feedings and incorporation of oral feedings. Educated on aspiration precautions. Compazine 10 mg PO/per tube every 6 hours as needed for nausea. Recommend at least one-two days between use of laxatives and antidiarrheals. Continue nightly Remeron. Monitor sleep pattern in setting of changes to diet, bowel pattern, and adjustment to home environment. Continue Tylenol 1,000 mg every 8 hours as needed for pain. Will monitor pain closely. Patient and wife aware to call for changes/needs. Palliative will plan to see patient back  in 2-4 weeks in collaboration to other oncology appointments.   Patient expressed understanding and was in agreement with this plan. He also understands that He can call the clinic at any time with any questions, concerns, or complaints.   Any controlled substances utilized were prescribed in the context of palliative care. PDMP has been reviewed.    Visit consisted of counseling and education dealing with the complex and emotionally intense issues of symptom management and palliative care in the setting of serious and potentially life-threatening illness.Greater than 50%  of this time was spent counseling and coordinating care related to the above assessment and plan.  Willette Alma, AGPCNP-BC  Palliative Medicine Team/[facility]  *Please note that this is a verbal dictation therefore any spelling or grammatical errors are due to the "[employer] One" system interpretation.
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
## 2022-08-15 ENCOUNTER — Encounter: Payer: Self-pay | Admitting: Nurse Practitioner

## 2022-08-15 ENCOUNTER — Ambulatory Visit: Payer: Medicare Other | Attending: Surgery | Admitting: Physical Therapy

## 2022-08-15 ENCOUNTER — Inpatient Hospital Stay: Payer: Medicare Other | Attending: Internal Medicine | Admitting: Nurse Practitioner

## 2022-08-15 ENCOUNTER — Encounter: Payer: Self-pay | Admitting: Physical Therapy

## 2022-08-15 VITALS — BP 116/67 | HR 106 | Temp 98.5°F | Resp 16 | Ht 69.0 in | Wt 141.1 lb

## 2022-08-15 DIAGNOSIS — R279 Unspecified lack of coordination: Secondary | ICD-10-CM | POA: Insufficient documentation

## 2022-08-15 DIAGNOSIS — M6281 Muscle weakness (generalized): Secondary | ICD-10-CM | POA: Diagnosis not present

## 2022-08-15 DIAGNOSIS — G893 Neoplasm related pain (acute) (chronic): Secondary | ICD-10-CM | POA: Diagnosis not present

## 2022-08-15 DIAGNOSIS — M62838 Other muscle spasm: Secondary | ICD-10-CM | POA: Insufficient documentation

## 2022-08-15 DIAGNOSIS — R634 Abnormal weight loss: Secondary | ICD-10-CM | POA: Diagnosis not present

## 2022-08-15 DIAGNOSIS — R1013 Epigastric pain: Secondary | ICD-10-CM | POA: Diagnosis not present

## 2022-08-15 DIAGNOSIS — C49A5 Gastrointestinal stromal tumor of rectum: Secondary | ICD-10-CM

## 2022-08-15 DIAGNOSIS — R293 Abnormal posture: Secondary | ICD-10-CM | POA: Diagnosis not present

## 2022-08-15 DIAGNOSIS — Z515 Encounter for palliative care: Secondary | ICD-10-CM

## 2022-08-15 MED ORDER — LIDOCAINE 5 % EX OINT: 1.0000 | TOPICAL_OINTMENT | Freq: Three times a day (TID) | CUTANEOUS | 3 refills | Status: DC | PRN

## 2022-08-15 MED ORDER — PANTOPRAZOLE SODIUM 40 MG PO TBEC
40.0000 mg | DELAYED_RELEASE_TABLET | Freq: Two times a day (BID) | ORAL | 3 refills | Status: DC
Start: 2022-08-15 — End: 2022-11-26

## 2022-08-17 ENCOUNTER — Telehealth: Payer: Self-pay

## 2022-08-17 MED ORDER — MIRTAZAPINE 15 MG PO TABS
15.0000 mg | ORAL_TABLET | Freq: Every day | ORAL | 2 refills | Status: DC
Start: 2022-08-17 — End: 2022-09-19

## 2022-08-17 NOTE — Telephone Encounter (Addendum)
Called left a voice message patient to give office a call back for monitor results. Will try calling again.  ----- Message from Azalee Course, [place] sent at 08/15/2022  3:11 PM EDT ----- Normal rhythm with occasional premature beats and short bursts of fast rhythm. Overall benign without obvious afib or aflutter to explain the stroke in February.

## 2022-08-17 NOTE — Addendum Note (Signed)
Addended by: Glee Arvin on: 08/17/2022 12:24 PM   Modules accepted: Orders

## 2022-08-21 ENCOUNTER — Encounter: Payer: Self-pay | Admitting: Physical Therapy

## 2022-08-21 ENCOUNTER — Ambulatory Visit: Payer: Medicare Other | Admitting: Physical Therapy

## 2022-08-21 DIAGNOSIS — M62838 Other muscle spasm: Secondary | ICD-10-CM | POA: Diagnosis not present

## 2022-08-21 DIAGNOSIS — R279 Unspecified lack of coordination: Secondary | ICD-10-CM | POA: Diagnosis not present

## 2022-08-21 DIAGNOSIS — R293 Abnormal posture: Secondary | ICD-10-CM | POA: Diagnosis not present

## 2022-08-21 DIAGNOSIS — M6281 Muscle weakness (generalized): Secondary | ICD-10-CM | POA: Diagnosis not present

## 2022-08-21 NOTE — Progress Notes (Signed)
Palliative Medicine [facility]  [phone] [phone]) [phone]   Name: Jesse [name] Date: 08/21/2022 MRN: [id number]  DOB: 06/08/1950  Patient Care Team: [name], MD as PCP - General (Family Medicine) Si Gaul, MD as Consulting Physician (Oncology) Verner Chol, [facility] (Inactive) as Pharmacist (Pharmacist) Iva Boop, MD as Consulting Physician (Gastroenterology) Josephine Igo, DO as Consulting Physician (Pulmonary Disease) Berneice Heinrich Delbert Phenix., MD as Consulting Physician (Urology) Karie Soda, MD as Consulting Physician (General Surgery)    INTERVAL HISTORY: Jesse [name] is a 72 y.o. 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 Previous medical history also includes hypertension, hyperlipidemia, GERD, anemia, COPD, and macular degeneration and a stroke during most recent hospitalization. Surgical history includes ileostomy and takedown. Peg tube placed (07/04/2022) for esophageal stricture/dysphagia. Palliative asked to see for symptom management and goals of care.   SOCIAL HISTORY:     reports that he quit smoking about 2 years ago. His smoking use included cigarettes. He has a 20.00 pack-year smoking history. He has never used smokeless tobacco. He reports current alcohol use of about 15.0 standard drinks of alcohol per week. He reports that he does not currently use drugs after having used the following drugs: Marijuana.  ADVANCE DIRECTIVES:  Advanced directives on file  CODE STATUS: Full code  PAST MEDICAL HISTORY: Past Medical History:  Diagnosis Date   Anemia    Cancer (HCC)    bladder cancer   COPD (chronic obstructive pulmonary disease) (HCC)    mild    DIVERTICULOSIS, COLON 01/14/2007   Qualifier: Diagnosis of  By: Marcelyn Ditty RN, Katy Fitch    GASTRITIS, CHRONIC  06/01/2004   Qualifier: Diagnosis of  By: Creta Levin CMA (AAMA), Robin     GERD 01/09/2007   Qualifier: Diagnosis of  By: Tawanna Cooler, RN, Ellen     Heart murmur    as a child; no murmur heard 12/14/19   History of radiation therapy    Mediastinum- 05/15/22-06/28/22-Dr. Antony Blackbird   HYPERLIPIDEMIA 01/09/2007   Qualifier: Diagnosis of  By: Everett Graff     Hypertension    Lung mass    left upper nodule   Macular degeneration    right eye   NEOPLASM, MALIGNANT, BLADDER, HX OF 2002   Qualifier: Diagnosis of  By: Creta Levin CMA (AAMA), Robin  / no chemo or radiation   OSTEOARTHRITIS 01/14/2007   Qualifier: Diagnosis of  By: Marcelyn Ditty, RN, Katy Fitch - knees   Rectal mass    Reflux esophagitis 06/01/2004   Qualifier: Diagnosis of  By: Creta Levin CMA (AAMA), Robin     TOBACCO USER 02/16/2009   Qualifier: Diagnosis of  By: Amador Cunas  MD, Janett Labella    VITAMIN D DEFICIENCY 06/03/2007   Qualifier: Diagnosis of  By: Amador Cunas  MD, Janett Labella     ALLERGIES:  has No Known Allergies.  MEDICATIONS:  Current Outpatient Medications  Medication Sig Dispense Refill   acetaminophen (TYLENOL) 500 MG tablet Take 1,000 mg by mouth every 8 (eight) hours as needed for moderate pain.     amLODipine (NORVASC) 5 MG tablet Take 1 tablet (5 mg total) by mouth daily. 90 tablet 3   aspirin 81 MG chewable tablet Chew 1 tablet (81 mg total) by mouth daily.     atorvastatin (LIPITOR) 40 MG tablet Take 1/2 tablet (20 mg total) by mouth  daily. 90 tablet 3   Cholecalciferol (VITAMIN D3 PO) Take 2,000 Units by mouth daily.     cholestyramine (QUESTRAN) 4 GM/DOSE powder Take 4 g by mouth 2 (two) times daily.     feeding supplement (ENSURE ENLIVE / ENSURE PLUS) LIQD Take 237 mLs by mouth 3 (three) times daily between meals. 237 mL 12   ferrous sulfate 325 (65 FE) MG EC tablet Take 1 tablet (325 mg total) by mouth 2 (two) times daily. 60 tablet 1   imatinib (GLEEVEC) 400 MG tablet Take 1 tablet (400 mg total) by mouth daily. Take  with meals and large glass of water.Caution:Chemotherapy. (Patient taking differently: Take 400 mg by mouth at bedtime. Take with meals and large glass of water.Caution:Chemotherapy.) 30 tablet 3   lidocaine (XYLOCAINE) 2 % solution Use as directed 15 mLs in the mouth or throat 4 (four) times daily -  before meals and at bedtime. (Patient not taking: Reported on 07/30/2022) 420 mL 1   lidocaine (XYLOCAINE) 5 % ointment Apply 1 Application topically 3 (three) times daily as needed for mild pain or moderate pain. 35.44 g 3   loperamide (IMODIUM A-D) 2 MG tablet Take 4 mg by mouth 4 (four) times daily as needed for diarrhea or loose stools.     mirtazapine (REMERON) 15 MG tablet Take 1 tablet (15 mg total) by mouth at bedtime. 30 tablet 2   Multiple Vitamins-Minerals (PRESERVISION AREDS 2) CAPS Take 1 capsule by mouth 2 (two) times daily.     Nutritional Supplements (FEEDING SUPPLEMENT, OSMOLITE 1.5 CAL,) LIQD Place 1,000 mLs into feeding tube daily. -Continue cyclic tube feed of 16 hours (off 8 hours during the day) -Osmolite 1.5 @ 22mL/hr x16 hours (1600-0800) via NGT -30cc H20 flush Q4hours.  0   pantoprazole (PROTONIX) 40 MG tablet Take 1 tablet (40 mg total) by mouth 2 (two) times daily. 60 tablet 3   prochlorperazine (COMPAZINE) 10 MG tablet Take 1 tablet (10 mg total) by mouth every 6 (six) hours as needed for nausea or vomiting. (Patient not taking: Reported on 07/30/2022) 30 tablet 0   Protein (FEEDING SUPPLEMENT, PROSOURCE TF20,) liquid Place 60 mLs into feeding tube daily. (Patient not taking: Reported on 07/30/2022)     sucralfate (CARAFATE) 1 g tablet Take 1 tablet by mouth 4 times daily -  with meals and at bedtime. 120 tablet 11   No current facility-administered medications for this visit.    VITAL SIGNS: There were no vitals taken for this visit. There were no vitals filed for this visit.  Estimated body mass index is 20.84 kg/m as calculated from the following:   Height as of  08/15/22: 5\' 9"  (1.753 m).   Weight as of 08/15/22: 141 lb 1.6 oz (64 kg).   PERFORMANCE STATUS (ECOG) : 1 - Symptomatic but completely ambulatory   Physical Exam General: NAD, some discomfort sitting Cardiovascular: Tachycardic (119) Pulmonary: normal breathing pattern  Abdomen: soft, nontender, + bowel sounds, PEG in place Extremities: no edema, no joint deformities Skin: no rashes Neurological: AAO x4  IMPRESSION: Jesse [name] presents to clinic today for follow-up. Wife is present. Patient had pelvic therapy prior to visit and somewhat uncomfortable and fatigued. Denies nausea, vomiting. Ongoing discomfort due to loose stools. Tolerating feedings. Weight has increased to 145lbs up from 141lbs on 5/1.   1.  Constipation/diarrhea/Dyspepsia  Jesse [name] is using lidocaine to sacral area however with minimum relief due to frequent stools requiring continuous cleaning. Some days continue to be better  than others. He is taking Imodium and Questran as prescribed. Education provided on use of lomotil for additional support. He has used in the past. Wife and patient verbalized understanding of use and precautions. Will send prescription to pharmacy.   Dyspepsia has improved. Discussed decreasing use of Carafate to twice daily however if symptoms increase will need to adjust back to four times daily.   We will continue to closely monitor and support.    2.  Sleep disturbance  Ongoing sleep interruptions due to need for bathroom visits. We discussed hopes of improvement with use of lomotil. He is also taking a gummy which allows for some rest compared to previous interventional attempts with medications.    4.  Pain Jesse [name] reports that he has intermittent back pain from "lying in the bed too long." He denies any cancer-related pain at this time. Uses Tylenol occasionally and feels that this is adequate for his pain relief.     5.   Goals of care   4/9- Jesse [name] wishes to be a FULL CODE and  pursue treatments that can improve his level of health and functioning. He has HCPOA listing his wife as Management consultant, and Living Will documents on file. Patient and wife interested in MOST form to help consolidate wishes onto one form that could be honored by medical providers/EMS in the event of an emergency. They are taking form home for review and to decide if they would like to complete at next visit.  We discussed Her current illness and what it means in the larger context of Her on-going co-morbidities. Natural disease trajectory and expectations were discussed.  I discussed the importance of continued conversation with family and their medical providers regarding overall plan of care and treatment options, ensuring decisions are within the context of the patients values and GOCs.  PLAN:   Compazine 10 mg PO/per tube every 6 hours as needed for nausea. Recommend at least one-two days between use of laxatives and antidiarrheals. Continue nightly Remeron. Monitor sleep pattern in setting of changes to diet, bowel pattern, and adjustment to home environment. Continue Tylenol 1,000 mg every 8 hours as needed for pain. Will monitor pain closely. Patient and wife aware to call for changes/needs. Lidocaine ointment to sacral area as needed Carafate twice daily Lomotil as needed up to four times daily. Education provided on safe use and prevention of constipation.  Protonix twice daily  Palliative will plan to see patient back in 2-4 weeks in collaboration to other oncology appointments.   Patient expressed understanding and was in agreement with this plan. He also understands that He can call the clinic at any time with any questions, concerns, or complaints.    Visit consisted of counseling and education dealing with the complex and emotionally intense issues of symptom management and palliative care in the setting of serious and potentially life-threatening illness.Greater than 50%  of this  time was spent counseling and coordinating care related to the above assessment and plan.  Willette Alma, AGPCNP-BC  Palliative Medicine Team/[facility]  *Please note that this is a verbal dictation therefore any spelling or grammatical errors are due to the "[employer] One" system interpretation.

## 2022-08-21 NOTE — Therapy (Signed)
OUTPATIENT PHYSICAL THERAPY MALE PELVIC TREATMENT   Patient Name: Jesse [name] MRN: [id number] DOB:1950-12-02, 72 y.o., male Today's Date: 08/21/2022  END OF SESSION:  PT End of Session - 08/21/22 1243     Visit Number 3    Date for PT Re-Evaluation 10/30/22    Authorization Type [employer]    PT Start Time 1150    PT Stop Time 1233    PT Time Calculation (min) 43 min    Activity Tolerance Patient tolerated treatment well    Behavior During Therapy WFL for tasks assessed/performed               Past Medical History:  Diagnosis Date   Anemia    Cancer (HCC)    bladder cancer   COPD (chronic obstructive pulmonary disease) (HCC)    mild    DIVERTICULOSIS, COLON 01/14/2007   Qualifier: Diagnosis of  By: Marcelyn Ditty RN, Katy Fitch    GASTRITIS, CHRONIC 06/01/2004   Qualifier: Diagnosis of  By: Creta Levin CMA (AAMA), Robin     GERD 01/09/2007   Qualifier: Diagnosis of  By: Tawanna Cooler, RN, Ellen     Heart murmur    as a child; no murmur heard 12/14/19   History of radiation therapy    Mediastinum- 05/15/22-06/28/22-Dr. Antony Blackbird   HYPERLIPIDEMIA 01/09/2007   Qualifier: Diagnosis of  By: Tawanna Cooler RN, Alvino Chapel     Hypertension    Lung mass    left upper nodule   Macular degeneration    right eye   NEOPLASM, MALIGNANT, BLADDER, HX OF 2002   Qualifier: Diagnosis of  By: Creta Levin CMA (AAMA), Robin  / no chemo or radiation   OSTEOARTHRITIS 01/14/2007   Qualifier: Diagnosis of  By: Marcelyn Ditty, RN, Katy Fitch - knees   Rectal mass    Reflux esophagitis 06/01/2004   Qualifier: Diagnosis of  By: Creta Levin CMA (AAMA), Robin     TOBACCO USER 02/16/2009   Qualifier: Diagnosis of  By: Amador Cunas  MD, Janett Labella    VITAMIN D DEFICIENCY 06/03/2007   Qualifier: Diagnosis of  By: Amador Cunas  MD, Janett Labella    Past Surgical History:  Procedure Laterality Date   BIOPSY  06/18/2022   Procedure: BIOPSY;  Surgeon: Shellia Cleverly, DO;  Location: [facility];  Service: Gastroenterology;;   BREAST BIOPSY Left  03/16/2022   [place] LT RADIOACTIVE SEED LOC 03/16/2022 [facility]   BRONCHIAL BIOPSY  10/13/2019   Procedure: BRONCHIAL BIOPSIES;  Surgeon: Josephine Igo, DO;  Location: [facility];  Service: Pulmonary;;   BRONCHIAL BRUSHINGS  10/13/2019   Procedure: BRONCHIAL BRUSHINGS;  Surgeon: Josephine Igo, DO;  Location: [facility];  Service: Pulmonary;;   BRONCHIAL NEEDLE ASPIRATION BIOPSY  10/13/2019   Procedure: BRONCHIAL NEEDLE ASPIRATION BIOPSIES;  Surgeon: Josephine Igo, DO;  [facility]: [facility];  Service: Pulmonary;;   BRONCHIAL NEEDLE ASPIRATION BIOPSY  04/11/2022   Procedure: BRONCHIAL NEEDLE ASPIRATION BIOPSIES;  Surgeon: Josephine Igo, DO;  Location: [facility];  Service: Cardiopulmonary;;   BRONCHIAL WASHINGS  10/13/2019   Procedure: BRONCHIAL WASHINGS;  Surgeon: Josephine Igo, DO;  [facility]: [facility];  Service: Pulmonary;;   COLONOSCOPY     multiple   DIVERTING ILEOSTOMY N/A 12/14/2021   Procedure: DIVERTING ILEOSTOMY;  Surgeon: Karie Soda, MD;  Location: [facility];  Service: General;  Laterality: N/A;   ENDOBRONCHIAL ULTRASOUND Bilateral 04/11/2022   Procedure: ENDOBRONCHIAL ULTRASOUND;  Surgeon: Josephine Igo, DO;  Location: [facility];  Service: Cardiopulmonary;  Laterality: Bilateral;  ESOPHAGEAL DILATION  06/18/2022   Procedure: ESOPHAGEAL DILATION;  Surgeon: Shellia Cleverly, DO;  Location: [facility];  Service: Gastroenterology;;   ESOPHAGOGASTRODUODENOSCOPY (EGD) WITH PROPOFOL N/A 06/18/2022   Procedure: ESOPHAGOGASTRODUODENOSCOPY (EGD) WITH PROPOFOL;  Surgeon: Shellia Cleverly, DO;  Location: [facility];  Service: Gastroenterology;  Laterality: N/A;  Severe dysphagia/odynophagia ?radiation esophagitis versus oral candidiasis   ILEOSTOMY CLOSURE N/A 03/22/2022   Procedure: OPEN TAKEDOWN OF LOOP ILEOSTOMY;  Surgeon: Karie Soda, MD;  Location: [facility];  Service: General;  Laterality: N/A;  GEN w/ERAS PATHWAY LOCAL   INTERCOSTAL NERVE BLOCK Left  12/16/2019   Procedure: INTERCOSTAL NERVE BLOCK;  Surgeon: Corliss Skains, MD;  Location: [facility];  Service: Thoracic;  Laterality: Left;   IR FLUORO RM 30-60 MIN  07/02/2022   KNEE SURGERY  07/20/2008   bilat.   NASAL SEPTUM SURGERY  1995   NODE DISSECTION Left 12/16/2019   Procedure: NODE DISSECTION;  Surgeon: Corliss Skains, MD;  Location: [facility];  Service: Thoracic;  Laterality: Left;   PEG PLACEMENT N/A 07/04/2022   Procedure: PERCUTANEOUS ENDOSCOPIC GASTROSTOMY (PEG) PLACEMENT;  Surgeon: Iva Boop, MD;  Location: [facility];  Service: Gastroenterology;  Laterality: N/A;   RADIOACTIVE SEED GUIDED AXILLARY SENTINEL LYMPH NODE Left 03/20/2022   Procedure: RADIOACTIVE SEED GUIDED LEFT AXILLARY SENTINEL LYMPH NODE BIOPSY;  Surgeon: Harriette Bouillon, MD;  Location: [facility];  Service: General;  Laterality: Left;   RECTAL EXAM UNDER ANESTHESIA N/A 03/22/2022   Procedure: ANORECTAL EXAMINATION UNDER ANESTHESIA;  Surgeon: Karie Soda, MD;  Location: [facility];  Service: General;  Laterality: N/A;   TONSILLECTOMY     TRANSURETHRAL RESECTION OF BLADDER TUMOR  2002   UPPER GASTROINTESTINAL ENDOSCOPY     VIDEO BRONCHOSCOPY  04/11/2022   Procedure: VIDEO BRONCHOSCOPY;  Surgeon: Josephine Igo, DO;  Location: [facility];  Service: Cardiopulmonary;;   VIDEO BRONCHOSCOPY WITH ENDOBRONCHIAL NAVIGATION N/A 10/13/2019   Procedure: VIDEO BRONCHOSCOPY WITH ENDOBRONCHIAL NAVIGATION;  Surgeon: Josephine Igo, DO;  [facility];  Service: Pulmonary;  Laterality: N/A;   WISDOM TOOTH EXTRACTION     XI ROBOTIC ASSISTED LOWER ANTERIOR RESECTION N/A 12/14/2021   Procedure: XI ROBOTIC ASSISTED LOWER ANTERIOR ULTRA LOW RECTOSIGMOID RESECTION, COLOANAL HAND SEWN ANASTOMOSIS, AND BILATERAL TAP BLOCK;  Surgeon: Karie Soda, MD;  Location: [facility];  Service: General;  Laterality: N/A;   Patient Active Problem List   Diagnosis Date Noted   Acute ischemic stroke, likely embolic 07/02/2022   Esophageal  dysphagia 06/18/2022   Esophageal stricture 06/18/2022   Hiatal hernia 06/18/2022   Radiation-induced esophagitis 06/18/2022   Pancytopenia due to chemotherapy 06/07/2022   Protein-calorie malnutrition, severe 06/07/2022   Severe sepsis due to Klebsiella pneumonia and bacteremia 06/05/2022   AKI (acute kidney injury) (HCC) 06/05/2022   Hyponatremia 06/05/2022   Acute metabolic encephalopathy 06/05/2022   Acute respiratory failure with hypoxia (HCC) 06/05/2022   Acute on chronic diarrhea 06/05/2022   Weight loss 05/28/2022   Malignant neoplasm of lung (HCC) 04/30/2022   Adenopathy 04/06/2022   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   Encounter for antineoplastic chemotherapy 01/28/2020   Adenocarcinoma of left lung, stage 2 (HCC) 12/31/2019   Goals of care, counseling/discussion 12/31/2019   S/P lobectomy of lung 12/16/2019   Lung nodule 10/13/2019   Essential hypertension 09/28/2016   Coronary artery calcification 09/28/2016   Impaired glucose tolerance 09/28/2016   NEOPLASM, MALIGNANT, BLADDER, HX OF 07/18/2007   Vitamin D deficiency 06/03/2007  DIVERTICULOSIS, COLON 01/14/2007   Osteoarthritis 01/14/2007   Dyslipidemia 01/09/2007   GERD 01/09/2007   REFLUX ESOPHAGITIS 06/01/2004   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    PCP: [name], MD   REFERRING PROVIDER: Karie Soda, MD   REFERRING DIAG: R15.2 (ICD-10-CM) - Rectal urgency   THERAPY DIAG:  Muscle weakness (generalized)  Other muscle spasm  Unspecified lack of coordination  Abnormal posture  Rationale for Evaluation and Treatment: Rehabilitation  ONSET DATE: 03-2022 ostomy reversal and worse since that time  SUBJECTIVE:                                                                                                                                                                                           SUBJECTIVE STATEMENT: Pt states he is very red and very sore and  bleeds a lot.  States sometimes has to go when doing the exercises Fluid intake: 6x 12 oz bottles water.  Pt has feeding tube in place and takes pint with each feeding via the feeding tube, drinking milk  PAIN:  Are you having pain? Yes NPRS scale: 5ish/10 Pain location: Anal  Pain type: burning Pain description: intermittent   Aggravating factors: bowel movements Relieving factors: nothing a little better with the butt paste  PRECAUTIONS: None  WEIGHT BEARING RESTRICTIONS: No  FALLS:  Has patient fallen in last 6 months? No  LIVING ENVIRONMENT: Lives with: lives with their spouse Lives in: House/apartment   OCCUPATION: no  PLOF: Independent  PATIENT GOALS: be able to work in the yard, want to play golf this summer  PERTINENT HISTORY:  lung cancer, bladder cancer, GIST, HLD, HTN, CVA, CAD, GERD, OA    BOWEL MOVEMENT: Pain with bowel movement: Yes Type of bowel movement:Type (Bristol Stool Scale) normal, Frequency 30 min or more, and Strain Yes sometimes Fully empty rectum: No Leakage: Yes: on the way to the bathroom Pads: Yes: diapers 4-5/day Fiber supplement: Yes:    URINATION: Pain with urination: No Fully empty bladder: Yes:    Leakage:  no Pads: No  INTERCOURSE: Not currently   OBJECTIVE:     PATIENT SURVEYS:    PFIQ-7   COGNITION: Overall cognitive status: Within functional limits for tasks assessed     SENSATION: Light touch: Appears intact Proprioception:   MUSCLE LENGTH: Hamstrings:   LUMBAR SPECIAL TESTS:    FUNCTIONAL TESTS:    GAIT: slow cadence, flexed posture   POSTURE: rounded shoulders, forward head, and increased thoracic kyphosis  PELVIC ALIGNMENT:  LUMBARAROM/PROM:  A/PROM A/PROM  eval  Flexion   Extension   Right lateral flexion   Left lateral flexion   Right  rotation   Left rotation    (Blank rows = not tested)  LOWER EXTREMITY AROM/PROM:  A/PROM Right eval Left eval  Hip flexion    Hip  extension    Hip abduction    Hip adduction    Hip internal rotation    Hip external rotation    Knee flexion    Knee extension    Ankle dorsiflexion    Ankle plantarflexion    Ankle inversion    Ankle eversion     (Blank rows = not tested)  LOWER EXTREMITY MMT:  MMT Right eval Left eval  Hip flexion    Hip extension    Hip abduction    Hip adduction    Hip internal rotation    Hip external rotation    Knee flexion    Knee extension    Ankle dorsiflexion    Ankle plantarflexion    Ankle inversion    Ankle eversion     PALPATION: GENERAL generally low muscle tone              External Perineal Exam anal wink reflex present              Internal Pelvic Floor tight and dyssynergic bulging Patient confirms identification and approves PT to assess internal pelvic floor and treatment Yes - exam to posterior pelvic floor rectally  PELVIC MMT:   MMT eval  Internal Anal Sphincter 2/5  External Anal Sphincter 2/5  Puborectalis 2/5  Diastasis Recti   (Blank rows = not tested)  TONE: high  TODAY'S TREATMENT:                                                                                                                              DATE: 08/21/22   Exercise:  Horizontal abduction band Bow and arrow with band Piriformis stretch sitting Adductor stretching Thoracic rotation standing Hip circles, cat cow in standing Sitting with foam noodle      PATIENT EDUCATION:  Education details:  Access Code: [email or web address] Person educated: Patient and Spouse Education method: Explanation, Demonstration, Actor cues, Verbal cues, and Handouts Education comprehension: verbalized understanding and needs further education  HOME EXERCISE PROGRAM: Access Code: [email or web address] URL: [email or web address] Date: 08/15/2022 Prepared by: Dwana Curd  Exercises - Supine Hip Internal and External Rotation  - 1 x daily - 7 x weekly - 1 sets - 10 reps - 5 sec hold -  Supine Piriformis Stretch with Leg Straight  - 1 x daily - 7 x weekly - 1 sets - 3 reps - 30 sec hold - Supine Figure 4 Piriformis Stretch  - 1 x daily - 7 x weekly - 1 sets - 3 reps - 30 sec hold - Supine Double Knee to Chest  - 1 x daily - 7 x weekly - 1 sets - 3 reps - 30 hold - Modified Cat Cow on Counter  - 1 x daily - 7 x weekly - 3  sets - 10 reps - Standing Hip Circles  - 1 x daily - 7 x weekly - 3 sets - 10 reps  ASSESSMENT:  CLINICAL IMPRESSION: Pt was able to do exercise progressions.  Still addressing issues with skin breakdown and pain.  Unable to tolerate internal STM due to extreme pain. Updates to HEP as seen  Pt will benefit from skilled PT to address all impairments and restore bowel function for return to gardening and normal activities and quality of life  OBJECTIVE IMPAIRMENTS: decreased activity tolerance, decreased coordination, decreased endurance, decreased ROM, decreased strength, increased muscle spasms, impaired flexibility, impaired tone, postural dysfunction, and pain.   ACTIVITY LIMITATIONS: sitting, sleeping, continence, and toileting  PARTICIPATION LIMITATIONS: interpersonal relationship, community activity, and yard work  PERSONAL FACTORS: 3+ comorbidities: lung cancer, bladder cancer, GIST, HLD, HTN, CVA, CAD, GERD, OA , ileostomy reversal, feeding tube  are also affecting patient's functional outcome.   REHAB POTENTIAL: Good  CLINICAL DECISION MAKING: Evolving/moderate complexity  EVALUATION COMPLEXITY: Moderate   GOALS: Goals reviewed with patient? Yes  SHORT TERM GOALS: Target date: 09/04/22 Updated 08/21/22  Pt will be ind with toileting techniques Baseline: Goal status: MET  2.  Pt will report 25% less anal pain due to improved skin hygiene and more complete bowel movements Baseline:  Goal status: NOT MET  3.  Pt will be ind with initial HEP Baseline:  Goal status: MET    LONG TERM GOALS: Target date: 10/30/22  Pt will be independent  with advanced HEP to maintain improvements made throughout therapy  Baseline:  Goal status: INITIAL  2.  Pt will report 80% reduction of pain due to improvements in posture, strength, and muscle length  Baseline:  Goal status: INITIAL  3.  Pt will be able to functional actions such as walking to the bathroom without leakage at least 80% of the time for reduced use of pads/diapers Baseline: 5 or more diapers per day Goal status: INITIAL  4.  Pt will be able to reduce number of bowel movements per day to 3-5 due to more complete emptying Baseline: 60/day Goal status: INITIAL  5.  Pt will be able to sleep at least 4 consecutive hours with waking 1-2x at most to use the bathroom Baseline:  Goal status: INITIAL     PLAN:  PT FREQUENCY: 1-2x/week  PT DURATION: 12 weeks  PLANNED INTERVENTIONS: Therapeutic exercises, Therapeutic activity, Neuromuscular re-education, Balance training, Gait training, Patient/Family education, Self Care, Joint mobilization, Dry Needling, Electrical stimulation, Cryotherapy, Moist heat, Taping, Traction, Biofeedback, Manual therapy, and Re-evaluation  PLAN FOR NEXT SESSION: f/u on pressure to perineum, hip and lumbar mobility and strength, breathing, cervical ROM, STM to erectors throughout spine   [employer], PT 08/21/2022, 12:43 PM

## 2022-08-22 ENCOUNTER — Inpatient Hospital Stay: Payer: Medicare Other | Admitting: Nutrition

## 2022-08-22 ENCOUNTER — Other Ambulatory Visit: Payer: Self-pay | Admitting: Nutrition

## 2022-08-22 MED ORDER — OSMOLITE 1.5 CAL PO LIQD: ORAL | 12 refills | Status: DC

## 2022-08-22 MED ORDER — JEVITY 1.5 CAL/FIBER PO LIQD: ORAL | Status: DC

## 2022-08-22 NOTE — Progress Notes (Signed)
Nutrition follow up completed with patient and wife.   Jesse [name] is a 72 y.o. 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 Previous medical history also includes hypertension, hyperlipidemia, GERD, anemia, COPD, and macular degeneration and a stroke during most recent hospitalization. Surgical history includes ileostomy and takedown. Peg tube placed (07/04/2022) for esophageal stricture/dysphagia. Palliative asked to see for symptom management and goals of care.   Wt improved and documented as 141 pounds 2 oz on May 1. Increased from 135 pounds 4 oz on April 15.  Estimated nutrition needs: 2000-2300 cal, 85-100 gm protein, greater than 2 L fluid.  Patient is tolerating PEG feedings of 3 cartons Osmolite 1.5 plus 3 cartons of Jevity 1.5 daily with 60 mL free water flush before and after bolus feedings QID. He continues to drink 1 bottle Enterade daily. He is eating bland foods like mashed potatoes, bananas, applesauce etc by mouth. He wants to try to drink bone broth. He consume 1-2 cartons of Ensure weekly but doesn't really like it.  Reports he continues to have diarrhea/multiple stools daily. States he had 35 stools today already. Reports he cannot control the stool. He is seeing PT to help with incontinence. Patient would like to try Balance of Nature Fiber and Spice which provides 8 gm fiber per 2 scoops. States GI took him off FiberCon. Wonders what guidelines there are for getting feeding tube removed.  Nutrition Diagnosis:  Inadequate oral intake continues.  Intervention: Educated to continue present TF regimen of 3 cartons Osmolite 1.5 + 3 cartons of Jevity 1.5 daily with 60 mL free water before and after bolus feedings QID. Educated plan would gradually decrease TF as oral intake increases. Once patient can maintain wt with oral intake alone for 2-4  weeks, could recommend Tube removal. Continue Enterade daily. Continue medications as prescribed by providers. Stressed importance of maintaining a schedule of TF and oral intake. If patient is going to try Balance of Nature Fiber and Spice, start with 1/2 scoop BID and increase slowly (Q 5 days) to recommended 2 scoops daily. Encouraged him to try new foods by mouth and gradually increase.  Monitoring, Evaluation, Goals: Patient will tolerate an increase in oral intake while weaning off TF and maintaining wt.  Next Visit: To be scheduled as needed.

## 2022-08-27 ENCOUNTER — Encounter: Payer: Self-pay | Admitting: Nurse Practitioner

## 2022-08-27 ENCOUNTER — Inpatient Hospital Stay (HOSPITAL_BASED_OUTPATIENT_CLINIC_OR_DEPARTMENT_OTHER): Payer: Medicare Other | Admitting: Nurse Practitioner

## 2022-08-27 ENCOUNTER — Ambulatory Visit: Payer: Medicare Other | Admitting: Physical Therapy

## 2022-08-27 VITALS — BP 117/78 | HR 119 | Temp 98.4°F | Resp 18 | Ht 69.0 in | Wt 145.4 lb

## 2022-08-27 DIAGNOSIS — R197 Diarrhea, unspecified: Secondary | ICD-10-CM | POA: Diagnosis not present

## 2022-08-27 DIAGNOSIS — R530 Neoplastic (malignant) related fatigue: Secondary | ICD-10-CM | POA: Diagnosis not present

## 2022-08-27 DIAGNOSIS — R293 Abnormal posture: Secondary | ICD-10-CM | POA: Diagnosis not present

## 2022-08-27 DIAGNOSIS — R279 Unspecified lack of coordination: Secondary | ICD-10-CM | POA: Diagnosis not present

## 2022-08-27 DIAGNOSIS — Z515 Encounter for palliative care: Secondary | ICD-10-CM

## 2022-08-27 DIAGNOSIS — C49A5 Gastrointestinal stromal tumor of rectum: Secondary | ICD-10-CM | POA: Diagnosis not present

## 2022-08-27 DIAGNOSIS — M62838 Other muscle spasm: Secondary | ICD-10-CM

## 2022-08-27 DIAGNOSIS — M6281 Muscle weakness (generalized): Secondary | ICD-10-CM

## 2022-08-27 DIAGNOSIS — G893 Neoplasm related pain (acute) (chronic): Secondary | ICD-10-CM

## 2022-08-27 MED ORDER — DIPHENOXYLATE-ATROPINE 2.5-0.025 MG PO TABS
1.0000 | ORAL_TABLET | Freq: Four times a day (QID) | ORAL | 0 refills | Status: DC | PRN
Start: 2022-08-27 — End: 2022-09-17

## 2022-08-27 NOTE — Therapy (Signed)
OUTPATIENT PHYSICAL THERAPY MALE PELVIC TREATMENT   Patient Name: Jesse [name] MRN: [id number] DOB:1950/06/04, 72 y.o., male Today's Date: 08/27/2022  END OF SESSION:  PT End of Session - 08/27/22 0827     Visit Number 4    Date for PT Re-Evaluation 10/30/22    Authorization Type [employer]    PT Start Time 0802    PT Stop Time 0840    PT Time Calculation (min) 38 min    Activity Tolerance Patient tolerated treatment well    Behavior During Therapy WFL for tasks assessed/performed                Past Medical History:  Diagnosis Date   Anemia    Cancer (HCC)    bladder cancer   COPD (chronic obstructive pulmonary disease) (HCC)    mild    DIVERTICULOSIS, COLON 01/14/2007   Qualifier: Diagnosis of  By: Marcelyn Ditty RN, Katy Fitch    GASTRITIS, CHRONIC 06/01/2004   Qualifier: Diagnosis of  By: Creta Levin CMA (AAMA), Robin     GERD 01/09/2007   Qualifier: Diagnosis of  By: Tawanna Cooler, RN, Ellen     Heart murmur    as a child; no murmur heard 12/14/19   History of radiation therapy    Mediastinum- 05/15/22-06/28/22-Dr. Antony Blackbird   HYPERLIPIDEMIA 01/09/2007   Qualifier: Diagnosis of  By: Tawanna Cooler RN, Alvino Chapel     Hypertension    Lung mass    left upper nodule   Macular degeneration    right eye   NEOPLASM, MALIGNANT, BLADDER, HX OF 2002   Qualifier: Diagnosis of  By: Creta Levin CMA (AAMA), Robin  / no chemo or radiation   OSTEOARTHRITIS 01/14/2007   Qualifier: Diagnosis of  By: Marcelyn Ditty, RN, Katy Fitch - knees   Rectal mass    Reflux esophagitis 06/01/2004   Qualifier: Diagnosis of  By: Creta Levin CMA (AAMA), Robin     TOBACCO USER 02/16/2009   Qualifier: Diagnosis of  By: Amador Cunas  MD, Janett Labella    VITAMIN D DEFICIENCY 06/03/2007   Qualifier: Diagnosis of  By: Amador Cunas  MD, Janett Labella    Past Surgical History:  Procedure Laterality Date   BIOPSY  06/18/2022   Procedure: BIOPSY;  Surgeon: Shellia Cleverly, DO;  Location: [facility];  Service: Gastroenterology;;   BREAST BIOPSY Left  03/16/2022   [place] LT RADIOACTIVE SEED LOC 03/16/2022 [facility]   BRONCHIAL BIOPSY  10/13/2019   Procedure: BRONCHIAL BIOPSIES;  Surgeon: Josephine Igo, DO;  Location: [facility];  Service: Pulmonary;;   BRONCHIAL BRUSHINGS  10/13/2019   Procedure: BRONCHIAL BRUSHINGS;  Surgeon: Josephine Igo, DO;  Location: [facility];  Service: Pulmonary;;   BRONCHIAL NEEDLE ASPIRATION BIOPSY  10/13/2019   Procedure: BRONCHIAL NEEDLE ASPIRATION BIOPSIES;  Surgeon: Josephine Igo, DO;  Location: [facility];  Service: Pulmonary;;   BRONCHIAL NEEDLE ASPIRATION BIOPSY  04/11/2022   Procedure: BRONCHIAL NEEDLE ASPIRATION BIOPSIES;  Surgeon: Josephine Igo, DO;  Location: [facility];  Service: Cardiopulmonary;;   BRONCHIAL WASHINGS  10/13/2019   Procedure: BRONCHIAL WASHINGS;  Surgeon: Josephine Igo, DO;  Location: [facility];  Service: Pulmonary;;   COLONOSCOPY     multiple   DIVERTING ILEOSTOMY N/A 12/14/2021   Procedure: DIVERTING ILEOSTOMY;  Surgeon: Karie Soda, MD;  Location: [facility];  Service: General;  Laterality: N/A;   ENDOBRONCHIAL ULTRASOUND Bilateral 04/11/2022   Procedure: ENDOBRONCHIAL ULTRASOUND;  Surgeon: Josephine Igo, DO;  Location: [facility];  Service: Cardiopulmonary;  Laterality: Bilateral;  ESOPHAGEAL DILATION  06/18/2022   Procedure: ESOPHAGEAL DILATION;  Surgeon: Shellia Cleverly, DO;  Location: [facility];  Service: Gastroenterology;;   ESOPHAGOGASTRODUODENOSCOPY (EGD) WITH PROPOFOL N/A 06/18/2022   Procedure: ESOPHAGOGASTRODUODENOSCOPY (EGD) WITH PROPOFOL;  Surgeon: Shellia Cleverly, DO;  Location: [facility];  Service: Gastroenterology;  Laterality: N/A;  Severe dysphagia/odynophagia ?radiation esophagitis versus oral candidiasis   ILEOSTOMY CLOSURE N/A 03/22/2022   Procedure: OPEN TAKEDOWN OF LOOP ILEOSTOMY;  Surgeon: Karie Soda, MD;  Location: [facility];  Service: General;  Laterality: N/A;  GEN w/ERAS PATHWAY LOCAL   INTERCOSTAL NERVE BLOCK Left  12/16/2019   Procedure: INTERCOSTAL NERVE BLOCK;  Surgeon: Corliss Skains, MD;  Location: [facility];  Service: Thoracic;  Laterality: Left;   IR FLUORO RM 30-60 MIN  07/02/2022   KNEE SURGERY  07/20/2008   bilat.   NASAL SEPTUM SURGERY  1995   NODE DISSECTION Left 12/16/2019   Procedure: NODE DISSECTION;  Surgeon: Corliss Skains, MD;  Location: [facility];  Service: Thoracic;  Laterality: Left;   PEG PLACEMENT N/A 07/04/2022   Procedure: PERCUTANEOUS ENDOSCOPIC GASTROSTOMY (PEG) PLACEMENT;  Surgeon: Iva Boop, MD;  Location: [facility];  Service: Gastroenterology;  Laterality: N/A;   RADIOACTIVE SEED GUIDED AXILLARY SENTINEL LYMPH NODE Left 03/20/2022   Procedure: RADIOACTIVE SEED GUIDED LEFT AXILLARY SENTINEL LYMPH NODE BIOPSY;  Surgeon: Harriette Bouillon, MD;  Location: [facility];  Service: General;  Laterality: Left;   RECTAL EXAM UNDER ANESTHESIA N/A 03/22/2022   Procedure: ANORECTAL EXAMINATION UNDER ANESTHESIA;  Surgeon: Karie Soda, MD;  Location: [facility];  Service: General;  Laterality: N/A;   TONSILLECTOMY     TRANSURETHRAL RESECTION OF BLADDER TUMOR  2002   UPPER GASTROINTESTINAL ENDOSCOPY     VIDEO BRONCHOSCOPY  04/11/2022   Procedure: VIDEO BRONCHOSCOPY;  Surgeon: Josephine Igo, DO;  Location: [facility];  Service: Cardiopulmonary;;   VIDEO BRONCHOSCOPY WITH ENDOBRONCHIAL NAVIGATION N/A 10/13/2019   Procedure: VIDEO BRONCHOSCOPY WITH ENDOBRONCHIAL NAVIGATION;  Surgeon: Josephine Igo, DO;  Location: [facility];  Service: Pulmonary;  Laterality: N/A;   WISDOM TOOTH EXTRACTION     XI ROBOTIC ASSISTED LOWER ANTERIOR RESECTION N/A 12/14/2021   Procedure: XI ROBOTIC ASSISTED LOWER ANTERIOR ULTRA LOW RECTOSIGMOID RESECTION, COLOANAL HAND SEWN ANASTOMOSIS, AND BILATERAL TAP BLOCK;  Surgeon: Karie Soda, MD;  Location: [facility];  Service: General;  Laterality: N/A;   Patient Active Problem List   Diagnosis Date Noted   Acute ischemic stroke, likely embolic 07/02/2022   Esophageal  dysphagia 06/18/2022   Esophageal stricture 06/18/2022   Hiatal hernia 06/18/2022   Radiation-induced esophagitis 06/18/2022   Pancytopenia due to chemotherapy 06/07/2022   Protein-calorie malnutrition, severe 06/07/2022   Severe sepsis due to Klebsiella pneumonia and bacteremia 06/05/2022   AKI (acute kidney injury) (HCC) 06/05/2022   Hyponatremia 06/05/2022   Acute metabolic encephalopathy 06/05/2022   Acute respiratory failure with hypoxia (HCC) 06/05/2022   Acute on chronic diarrhea 06/05/2022   Weight loss 05/28/2022   Malignant neoplasm of lung (HCC) 04/30/2022   Adenopathy 04/06/2022   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   Encounter for antineoplastic chemotherapy 01/28/2020   Adenocarcinoma of left lung, stage 2 (HCC) 12/31/2019   Goals of care, counseling/discussion 12/31/2019   S/P lobectomy of lung 12/16/2019   Lung nodule 10/13/2019   Essential hypertension 09/28/2016   Coronary artery calcification 09/28/2016   Impaired glucose tolerance 09/28/2016   NEOPLASM, MALIGNANT, BLADDER, HX OF 07/18/2007   Vitamin D deficiency 06/03/2007  DIVERTICULOSIS, COLON 01/14/2007   Osteoarthritis 01/14/2007   Dyslipidemia 01/09/2007   GERD 01/09/2007   REFLUX ESOPHAGITIS 06/01/2004   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    PCP: [name], MD   REFERRING PROVIDER: Karie Soda, MD   REFERRING DIAG: R15.2 (ICD-10-CM) - Rectal urgency   THERAPY DIAG:  Muscle weakness (generalized)  Other muscle spasm  Unspecified lack of coordination  Abnormal posture  Rationale for Evaluation and Treatment: Rehabilitation  ONSET DATE: 03-2022 ostomy reversal and worse since that time  SUBJECTIVE:                                                                                                                                                                                           SUBJECTIVE STATEMENT: Pt states exercises feel good.  Fecal  frequency is still the same, gets an hour of sleep at night  Fluid intake: 6x 12 oz bottles water.  Pt has feeding tube in place and takes pint with each feeding via the feeding tube, drinking milk  PAIN:  Are you having pain? Yes NPRS scale: 5ish/10 Pain location: Anal  Pain type: burning Pain description: intermittent   Aggravating factors: bowel movements Relieving factors: nothing a little better with the butt paste  PRECAUTIONS: None  WEIGHT BEARING RESTRICTIONS: No  FALLS:  Has patient fallen in last 6 months? No  LIVING ENVIRONMENT: Lives with: lives with their spouse Lives in: House/apartment   OCCUPATION: no  PLOF: Independent  PATIENT GOALS: be able to work in the yard, want to play golf this summer  PERTINENT HISTORY:  lung cancer, bladder cancer, GIST, HLD, HTN, CVA, CAD, GERD, OA    BOWEL MOVEMENT: Pain with bowel movement: Yes Type of bowel movement:Type (Bristol Stool Scale) normal, Frequency 30 min or more, and Strain Yes sometimes Fully empty rectum: No Leakage: Yes: on the way to the bathroom Pads: Yes: diapers 4-5/day Fiber supplement: Yes:    URINATION: Pain with urination: No Fully empty bladder: Yes:    Leakage:  no Pads: No  INTERCOURSE: Not currently   OBJECTIVE:     PATIENT SURVEYS:    PFIQ-7   COGNITION: Overall cognitive status: Within functional limits for tasks assessed     SENSATION: Light touch: Appears intact Proprioception:   MUSCLE LENGTH: Hamstrings:   LUMBAR SPECIAL TESTS:    FUNCTIONAL TESTS:    GAIT: slow cadence, flexed posture   POSTURE: rounded shoulders, forward head, and increased thoracic kyphosis  PELVIC ALIGNMENT:  LUMBARAROM/PROM:  A/PROM A/PROM  eval  Flexion   Extension   Right lateral flexion   Left lateral flexion   Right rotation  Left rotation    (Blank rows = not tested)  LOWER EXTREMITY AROM/PROM:  A/PROM Right eval Left eval  Hip flexion    Hip extension     Hip abduction    Hip adduction    Hip internal rotation    Hip external rotation    Knee flexion    Knee extension    Ankle dorsiflexion    Ankle plantarflexion    Ankle inversion    Ankle eversion     (Blank rows = not tested)  LOWER EXTREMITY MMT:  MMT Right eval Left eval  Hip flexion    Hip extension    Hip abduction    Hip adduction    Hip internal rotation    Hip external rotation    Knee flexion    Knee extension    Ankle dorsiflexion    Ankle plantarflexion    Ankle inversion    Ankle eversion     PALPATION: GENERAL generally low muscle tone              External Perineal Exam anal wink reflex present              Internal Pelvic Floor tight and dyssynergic bulging Patient confirms identification and approves PT to assess internal pelvic floor and treatment Yes - exam to posterior pelvic floor rectally  PELVIC MMT:   MMT eval  Internal Anal Sphincter 2/5  External Anal Sphincter 2/5  Puborectalis 2/5  Diastasis Recti   (Blank rows = not tested)  TONE: high  TODAY'S TREATMENT:                                                                                                                              DATE: 08/27/22   Exercise: hS stretchvibration Hip flexor stretch vibration Stnading at wall ext Standing at bar hip hinge Row with 7lb - 10x  Manual: Erectors along spine for improved mobility       PATIENT EDUCATION:  Education details:  Access Code: [email or web address] Person educated: Patient and Spouse Education method: Explanation, Facilities manager, Actor cues, Verbal cues, and Handouts Education comprehension: verbalized understanding and needs further education  HOME EXERCISE PROGRAM: Access Code: [email or web address] URL: [email or web address] Date: 08/15/2022 Prepared by: Dwana Curd  Exercises - Supine Hip Internal and External Rotation  - 1 x daily - 7 x weekly - 1 sets - 10 reps - 5 sec hold - Supine Piriformis Stretch  with Leg Straight  - 1 x daily - 7 x weekly - 1 sets - 3 reps - 30 sec hold - Supine Figure 4 Piriformis Stretch  - 1 x daily - 7 x weekly - 1 sets - 3 reps - 30 sec hold - Supine Double Knee to Chest  - 1 x daily - 7 x weekly - 1 sets - 3 reps - 30 hold - Modified Cat Cow on Counter  - 1 x daily - 7 x weekly - 3 sets -  10 reps - Standing Hip Circles  - 1 x daily - 7 x weekly - 3 sets - 10 reps  ASSESSMENT:  CLINICAL IMPRESSION: Today's session focused on improved mobility throughout the hips and spine for improved ability to stretch into the pelvic floor.  Pt had good release with manual and did well with stretches on vibration plate.  Pt reports feeling muscles more loose.  Pt will continue to benefit from skilled Pt to address muskuloskeletal impairments for reduction in difficulty with bowel movements.   OBJECTIVE IMPAIRMENTS: decreased activity tolerance, decreased coordination, decreased endurance, decreased ROM, decreased strength, increased muscle spasms, impaired flexibility, impaired tone, postural dysfunction, and pain.   ACTIVITY LIMITATIONS: sitting, sleeping, continence, and toileting  PARTICIPATION LIMITATIONS: interpersonal relationship, community activity, and yard work  PERSONAL FACTORS: 3+ comorbidities: lung cancer, bladder cancer, GIST, HLD, HTN, CVA, CAD, GERD, OA , ileostomy reversal, feeding tube  are also affecting patient's functional outcome.   REHAB POTENTIAL: Good  CLINICAL DECISION MAKING: Evolving/moderate complexity  EVALUATION COMPLEXITY: Moderate   GOALS: Goals reviewed with patient? Yes  SHORT TERM GOALS: Target date: 09/04/22 Updated 08/21/22  Pt will be ind with toileting techniques Baseline: Goal status: MET  2.  Pt will report 25% less anal pain due to improved skin hygiene and more complete bowel movements Baseline:  Goal status: NOT MET  3.  Pt will be ind with initial HEP Baseline:  Goal status: MET    LONG TERM GOALS: Target date:  10/30/22  Pt will be independent with advanced HEP to maintain improvements made throughout therapy  Baseline:  Goal status: IN PROGRESS  2.  Pt will report 80% reduction of pain due to improvements in posture, strength, and muscle length  Baseline:  Goal status: IN PROGRESS  3.  Pt will be able to functional actions such as walking to the bathroom without leakage at least 80% of the time for reduced use of pads/diapers Baseline: 5 or more diapers per day Goal status: IN PROGRESS  4.  Pt will be able to reduce number of bowel movements per day to 3-5 due to more complete emptying Baseline: 60/day Goal status: IN PROGRESS  5.  Pt will be able to sleep at least 4 consecutive hours with waking 1-2x at most to use the bathroom Baseline:  Goal status: IN PROGRESS     PLAN:  PT FREQUENCY: 1-2x/week  PT DURATION: 12 weeks  PLANNED INTERVENTIONS: Therapeutic exercises, Therapeutic activity, Neuromuscular re-education, Balance training, Gait training, Patient/Family education, Self Care, Joint mobilization, Dry Needling, Electrical stimulation, Cryotherapy, Moist heat, Taping, Traction, Biofeedback, Manual therapy, and Re-evaluation  PLAN FOR NEXT SESSION:  STM to erectors throughout spine, core and posture strength, hip hinging and squats for functional strength, vibration plate stretches   [employer], PT 08/27/2022, 8:27 AM

## 2022-09-03 ENCOUNTER — Encounter: Payer: Self-pay | Admitting: Neurology

## 2022-09-03 ENCOUNTER — Ambulatory Visit: Payer: Medicare Other | Admitting: Neurology

## 2022-09-03 VITALS — BP 127/78 | HR 112 | Ht 70.0 in | Wt 142.5 lb

## 2022-09-03 DIAGNOSIS — I6359 Cerebral infarction due to unspecified occlusion or stenosis of other cerebral artery: Secondary | ICD-10-CM

## 2022-09-03 NOTE — Progress Notes (Signed)
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  Patient initially presented to the hospital on February 20 due to a few days of confusion and generalized malaise.  He does not remember his initial presentation.  In the ED he was found to have pneumonia on chest xray and being hypotensive.  He was admitted to the ICU.  While in the ICU he did have a MRI brain which showed scattered small acute infarcts in the supratentorial and infratentorial brain without hemorrhage or mass effect.  He CT angiogram head and neck did not show any large vessel occlusion.   He was started on DAPT, aspirin and Plavix for 3 days and currently on aspirin alone.  Patient also started on a statin.  Since discharge from the hospital he continued to improve.  He was sent to rehab for few weeks before going home.  Wife reported since being home, his mental status continued to improve.  Initially he has trouble using the TV remote, using his phone but those are now better.  Again he is compliant with his medication.   Hospital summary and course  Jesse [name] is a 72 y.o. M with lung CA metastatic to lymph nodes on chemoradiation, also hx GIST, s/p ileostomy and later ileostomy takedown who presented with acute metabolic encephalopathy and hypoxia. Found to have pneumonia, Klebsiella bacteremia and small embolic stroke   Hospitalization complicated by new reduced EF and also poor oral intake manage/radiation esophagitis requiring tube feeding, GI following.   * Sepsis with end organ damage due to Klebsiella pneumonia and bacteremia Admitted and CXR showed pneumonia.  Had encephalopathy and respiratory failure due to sepsis.  Blood cultures growing Klebsiella.  Treated and resolved.   Acute ischemic stroke, likely embolic This was an incidental finding.  -Echocardiogram showed no cardiogenic source of embolism -Carotid imaging unremarkable   -Lipids ordered: discharged on atorvastatin 40 mg daily -Aspirin ordered at time of diagnosis.  Treated with 3 weeks DAPT, discharged on apsirin.   Neuro follow up arranged. -Atrial fibrillation: no evidence on telemetry over 3 weeks -tPA not given because deficits mild -Dysphagia screen ordered on diagnosis -PT eval ordered: recommended SNF -Smoking cessation: not pertinent Chronic systolic congestive heart failure Another incidental finding during work up of stroke. Cardiology consulted.  Started on metoprolol.  BP precluded ACE-ARNI Did not require diuretic.   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     amLODipine (NORVASC) 5 MG tablet Take 1 tablet (5 mg total) by mouth daily. 90 tablet 3  aspirin 81 MG chewable tablet Chew 1 tablet (81 mg total) by mouth daily.     atorvastatin (LIPITOR) 40 MG tablet Take 1/2 tablet (20 mg total) by mouth daily. 90 tablet 3   Cholecalciferol (VITAMIN D3 PO) Take 2,000 Units by mouth daily.     cholestyramine (QUESTRAN)  4 GM/DOSE powder Take 4 g by mouth 2 (two) times daily.     diphenoxylate-atropine (LOMOTIL) 2.5-0.025 MG tablet Take 1 tablet by mouth 4 (four) times daily as needed for diarrhea or loose stools. 30 tablet 0   ferrous sulfate 325 (65 FE) MG EC tablet Take 1 tablet (325 mg total) by mouth 2 (two) times daily. 60 tablet 1   imatinib (GLEEVEC) 400 MG tablet Take 1 tablet (400 mg total) by mouth daily. Take with meals and large glass of water.Caution:Chemotherapy. (Patient taking differently: Take 400 mg by mouth at bedtime. Take with meals and large glass of water.Caution:Chemotherapy.) 30 tablet 3   lidocaine (XYLOCAINE) 2 % solution Use as directed 15 mLs in the mouth or throat 4 (four) times daily -  before meals and at bedtime. 420 mL 1   lidocaine (XYLOCAINE) 5 % ointment Apply 1 Application topically 3 (three) times daily as needed for mild pain or moderate pain. 35.44 g 3   loperamide (IMODIUM A-D) 2 MG tablet Take 4 mg by mouth 4 (four) times daily as needed for diarrhea or loose stools.     mirtazapine (REMERON) 15 MG tablet Take 1 tablet (15 mg total) by mouth at bedtime. 30 tablet 2   Multiple Vitamins-Minerals (PRESERVISION AREDS 2) CAPS Take 1 capsule by mouth 2 (two) times daily.     Nutritional Supplements (FEEDING SUPPLEMENT, JEVITY 1.5 CAL/FIBER,) LIQD Change TF to 1 carton Osmolite 1.5 + 1 carton Jevity 1.5 with 60 mL free water before and after feedings TID via PEG. 711 mL ML   Nutritional Supplements (FEEDING SUPPLEMENT, OSMOLITE 1.5 CAL,) LIQD Change TF to 1 carton Osmolite 1.5 + 1 carton Jevity 1.5 with 60 mL free water before and after feedings TID via PEG. 711 mL 12   pantoprazole (PROTONIX) 40 MG tablet Take 1 tablet (40 mg total) by mouth 2 (two) times daily. 60 tablet 3   sucralfate (CARAFATE) 1 g tablet Take 1 tablet by mouth 4 times daily -  with meals and at bedtime. 120 tablet 11   prochlorperazine (COMPAZINE) 10 MG tablet Take 1 tablet (10 mg total) by mouth every 6 (six)  hours as needed for nausea or vomiting. (Patient not taking: Reported on 07/30/2022) 30 tablet 0   Protein (FEEDING SUPPLEMENT, PROSOURCE TF20,) liquid Place 60 mLs into feeding tube daily. (Patient not taking: Reported on 07/30/2022)     No facility-administered medications prior to visit.    PAST MEDICAL HISTORY: Past Medical History:  Diagnosis Date   Anemia    Cancer (HCC)    bladder cancer   COPD (chronic obstructive pulmonary disease) (HCC)    mild    DIVERTICULOSIS, COLON 01/14/2007   Qualifier: Diagnosis of  By: Marcelyn Ditty RN, Katy Fitch    GASTRITIS, CHRONIC 06/01/2004   Qualifier: Diagnosis of  By: Creta Levin CMA (AAMA), Robin     GERD 01/09/2007   Qualifier: Diagnosis of  By: Tawanna Cooler RN, Ellen     Heart murmur    as a child; no murmur heard 12/14/19   History of radiation therapy    Mediastinum- 05/15/22-06/28/22-Dr. Antony Blackbird   HYPERLIPIDEMIA 01/09/2007   Qualifier: Diagnosis of  By:  Tawanna Cooler, RN, Alvino Chapel     Hypertension    Lung mass    left upper nodule   Macular degeneration    right eye   NEOPLASM, MALIGNANT, BLADDER, HX OF 2002   Qualifier: Diagnosis of  By: Creta Levin CMA (AAMA), Robin  / no chemo or radiation   OSTEOARTHRITIS 01/14/2007   Qualifier: Diagnosis of  By: Marcelyn Ditty, RN, Katy Fitch - knees   Rectal mass    Reflux esophagitis 06/01/2004   Qualifier: Diagnosis of  By: Creta Levin CMA (AAMA), Robin     Stroke [facility])    TOBACCO USER 02/16/2009   Qualifier: Diagnosis of  By: Amador Cunas  MD, Janett Labella    VITAMIN D DEFICIENCY 06/03/2007   Qualifier: Diagnosis of  By: Amador Cunas  MD, Janett Labella     PAST SURGICAL HISTORY: Past Surgical History:  Procedure Laterality Date   BIOPSY  06/18/2022   Procedure: BIOPSY;  Surgeon: Shellia Cleverly, DO;  Location: [facility];  Service: Gastroenterology;;   BREAST BIOPSY Left 03/16/2022   [place] LT RADIOACTIVE SEED LOC 03/16/2022 [facility]   BRONCHIAL BIOPSY  10/13/2019   Procedure: BRONCHIAL BIOPSIES;  Surgeon: Josephine Igo, DO;  [facility]: [facility];  Service: Pulmonary;;   BRONCHIAL BRUSHINGS  10/13/2019   Procedure: BRONCHIAL BRUSHINGS;  Surgeon: Josephine Igo, DO;  [facility]: [facility];  Service: Pulmonary;;   BRONCHIAL NEEDLE ASPIRATION BIOPSY  10/13/2019   Procedure: BRONCHIAL NEEDLE ASPIRATION BIOPSIES;  Surgeon: Josephine Igo, DO;  [facility];  Service: Pulmonary;;   BRONCHIAL NEEDLE ASPIRATION BIOPSY  04/11/2022   Procedure: BRONCHIAL NEEDLE ASPIRATION BIOPSIES;  Surgeon: Josephine Igo, DO;  [facility];  Service: Cardiopulmonary;;   BRONCHIAL WASHINGS  10/13/2019   Procedure: BRONCHIAL WASHINGS;  Surgeon: Josephine Igo, DO;  [facility];  Service: Pulmonary;;   COLONOSCOPY     multiple   DIVERTING ILEOSTOMY N/A 12/14/2021   Procedure: DIVERTING ILEOSTOMY;  Surgeon: Karie Soda, MD;  Location: [facility];  Service: General;  Laterality: N/A;   ENDOBRONCHIAL ULTRASOUND Bilateral 04/11/2022   Procedure: ENDOBRONCHIAL ULTRASOUND;  Surgeon: Josephine Igo, DO;  [facility];  Service: Cardiopulmonary;  Laterality: Bilateral;   ESOPHAGEAL DILATION  06/18/2022   Procedure: ESOPHAGEAL DILATION;  Surgeon: Shellia Cleverly, DO;  [facility];  Service: Gastroenterology;;   ESOPHAGOGASTRODUODENOSCOPY (EGD) WITH PROPOFOL N/A 06/18/2022   Procedure: ESOPHAGOGASTRODUODENOSCOPY (EGD) WITH PROPOFOL;  Surgeon: Shellia Cleverly, DO;  [facility];  Service: Gastroenterology;  Laterality: N/A;  Severe dysphagia/odynophagia ?radiation esophagitis versus oral candidiasis   ILEOSTOMY CLOSURE N/A 03/22/2022   Procedure: OPEN TAKEDOWN OF LOOP ILEOSTOMY;  Surgeon: Karie Soda, MD;  [facility]: [facility];  Service: General;  Laterality: N/A;  GEN w/ERAS PATHWAY LOCAL   INTERCOSTAL NERVE BLOCK Left 12/16/2019   Procedure: INTERCOSTAL NERVE BLOCK;  Surgeon: Corliss Skains, MD;  [facility];  Service: Thoracic;  Laterality: Left;   IR FLUORO RM 30-60  MIN  07/02/2022   KNEE SURGERY  07/20/2008   bilat.   NASAL SEPTUM SURGERY  1995   NODE DISSECTION Left 12/16/2019   Procedure: NODE DISSECTION;  Surgeon: Corliss Skains, MD;  [facility];  Service: Thoracic;  Laterality: Left;   PEG PLACEMENT N/A 07/04/2022   Procedure: PERCUTANEOUS ENDOSCOPIC GASTROSTOMY (PEG) PLACEMENT;  Surgeon: Iva Boop, MD;  Location: [facility];  Service: Gastroenterology;  Laterality: N/A;   RADIOACTIVE SEED GUIDED AXILLARY SENTINEL LYMPH NODE Left 03/20/2022   Procedure: RADIOACTIVE  SEED GUIDED LEFT AXILLARY SENTINEL LYMPH NODE BIOPSY;  Surgeon: Harriette Bouillon, MD;  [facility];  Service: General;  Laterality: Left;   RECTAL EXAM UNDER ANESTHESIA N/A 03/22/2022   Procedure: ANORECTAL EXAMINATION UNDER ANESTHESIA;  Surgeon: Karie Soda, MD;  [facility];  Service: General;  Laterality: N/A;   TONSILLECTOMY     TRANSURETHRAL RESECTION OF BLADDER TUMOR  2002   UPPER GASTROINTESTINAL ENDOSCOPY     VIDEO BRONCHOSCOPY  04/11/2022   Procedure: VIDEO BRONCHOSCOPY;  Surgeon: Josephine Igo, DO;  [facility];  Service: Cardiopulmonary;;   VIDEO BRONCHOSCOPY WITH ENDOBRONCHIAL NAVIGATION N/A 10/13/2019   Procedure: VIDEO BRONCHOSCOPY WITH ENDOBRONCHIAL NAVIGATION;  Surgeon: Josephine Igo, DO;  [facility];  Service: Pulmonary;  Laterality: N/A;   WISDOM TOOTH EXTRACTION     XI ROBOTIC ASSISTED LOWER ANTERIOR RESECTION N/A 12/14/2021   Procedure: XI ROBOTIC ASSISTED LOWER ANTERIOR ULTRA LOW RECTOSIGMOID RESECTION, COLOANAL HAND SEWN ANASTOMOSIS, AND BILATERAL TAP BLOCK;  Surgeon: Karie Soda, MD;  Location: [facility];  Service: General;  Laterality: N/A;    FAMILY HISTORY: Family History  Problem Relation Age of Onset   Dementia Mother    Diabetes Father    Mental illness Father    Peripheral vascular disease Brother     SOCIAL HISTORY: Social History   Socioeconomic History   Marital status: Married    Spouse name: Not on  file   Number of children: 3   Years of education: Not on file   Highest education level: Not on file  Occupational History    Comment: semi retired   Tobacco Use   Smoking status: Former    Packs/day: 0.50    Years: 40.00    Additional pack years: 0.00    Total pack years: 20.00    Types: Cigarettes    Quit date: 09/21/2019    Years since quitting: 2.9   Smokeless tobacco: Never  Vaping Use   Vaping Use: Never used  Substance and Sexual Activity   Alcohol use: Not Currently    Alcohol/week: 15.0 standard drinks of alcohol    Types: 15 Standard drinks or equivalent per week    Comment: wine/beer/martini   Drug use: Not Currently    Types: Marijuana    Comment: Smokes marijuana occasional, last use in 08/2019   Sexual activity: Not Currently  Other Topics Concern   Not on file  Social History Narrative   Married   3 children: 3 grandchildren; 3 great grandchildren   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   Enjoys yard work and Systems analyst   Social Determinants of Corporate investment banker Strain: Low Risk  (07/18/2021)   Overall Financial Resource Strain (CARDIA)    Difficulty of Paying Living Expenses: Not hard at all  Food Insecurity: No Food Insecurity (06/06/2022)   Hunger Vital Sign    Worried About Running Out of Food in the Last Year: Never true    Ran Out of Food in the Last Year: Never true  Transportation Needs: No Transportation Needs (06/06/2022)   PRAPARE - Administrator, Civil Service (Medical): No    Lack of Transportation (Non-Medical): No  Physical Activity: Sufficiently Active (07/18/2021)   Exercise Vital Sign    Days of Exercise per Week: 7 days    Minutes of Exercise per Session: 60 min  Stress: No Stress Concern Present (07/18/2021)   [employer] of Occupational Health - Occupational Stress Questionnaire  Feeling of Stress : Not at all  Social Connections: Moderately Isolated (07/12/2020)   Social Connection and Isolation Panel  [NHANES]    Frequency of Communication with Friends and Family: More than three times a week    Frequency of Social Gatherings with Friends and Family: More than three times a week    Attends Religious Services: Never    Database administrator or Organizations: No    Attends Banker Meetings: Never    Marital Status: Married  Catering manager Violence: Not At Risk (06/06/2022)   Humiliation, Afraid, Rape, and Kick questionnaire    Fear of Current or Ex-Partner: No    Emotionally Abused: No    Physically Abused: No    Sexually Abused: No    PHYSICAL EXAM  GENERAL EXAM/CONSTITUTIONAL: Vitals:  Vitals:   09/03/22 1414  BP: 127/78  Pulse: (!) 112  Weight: 142 lb 8 oz (64.6 kg)  Height: 5\' 10"  (1.778 m)   Body mass index is 20.45 kg/m. Wt Readings from Last 3 Encounters:  09/03/22 142 lb 8 oz (64.6 kg)  08/27/22 145 lb 7 oz (66 kg)  08/15/22 141 lb 1.6 oz (64 kg)   Patient is in no distress; well developed, nourished and groomed; neck is supple, thin male  MUSCULOSKELETAL: Gait, strength, tone, movements noted in Neurologic exam below  NEUROLOGIC: MENTAL STATUS:      No data to display         awake, alert, oriented to person, place and time recent and remote memory intact normal attention and concentration language fluent, comprehension intact, naming intact fund of knowledge appropriate  CRANIAL NERVE:  2nd, 3rd, 4th, 6th - Visual fields full to confrontation, extraocular muscles intact, no nystagmus 5th - facial sensation symmetric 7th - facial strength symmetric 8th - hearing intact 9th - palate elevates symmetrically, uvula midline 11th - shoulder shrug symmetric 12th - tongue protrusion midline  MOTOR:  normal bulk and tone, full strength in the BUE, BLE  SENSORY:  normal and symmetric to light touch  COORDINATION:  finger-nose-finger, fine finger movements normal  REFLEXES:  deep tendon reflexes present and  symmetric  GAIT/STATION:  normal     DIAGNOSTIC DATA (LABS, IMAGING, TESTING) - I reviewed patient records, labs, notes, testing and imaging myself where available.  Lab Results  Component Value Date   WBC 4.6 08/06/2022   HGB 8.8 (L) 08/06/2022   HCT 28.6 (L) 08/06/2022   MCV 96.9 08/06/2022   PLT 218 08/06/2022      Component Value Date/Time   NA 135 08/06/2022 1304   K 4.2 08/06/2022 1304   CL 99 08/06/2022 1304   CO2 28 08/06/2022 1304   GLUCOSE 134 (H) 08/06/2022 1304   BUN 26 (H) 08/06/2022 1304   CREATININE 0.69 08/06/2022 1304   CREATININE 0.70 07/24/2022 1254   CALCIUM 9.0 08/06/2022 1304   PROT 6.8 07/24/2022 1254   ALBUMIN 3.2 (L) 07/24/2022 1254   AST 26 07/24/2022 1254   ALT 27 07/24/2022 1254   ALKPHOS 116 07/24/2022 1254   BILITOT 0.3 07/24/2022 1254   GFRNONAA >60 08/06/2022 1304   GFRNONAA >60 07/24/2022 1254   GFRAA >60 12/31/2019 1428   Lab Results  Component Value Date   CHOL 83 06/12/2022   HDL 13 (L) 06/12/2022   LDLCALC 38 06/12/2022   LDLDIRECT 102.0 10/22/2018   TRIG 160 (H) 06/12/2022   CHOLHDL 6.4 06/12/2022   Lab Results  Component Value Date   HGBA1C  6.2 (H) 06/12/2022   Lab Results  Component Value Date   VITAMINB12 940 (H) 06/05/2022   Lab Results  Component Value Date   TSH 0.673 06/05/2022    MRI Brain 06/11/2022 1. 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 2. No evidence of intracranial metastatic disease.  CTA Head and Neck 06/12/2022 1. Negative CTA for large vessel occlusion or other emergent finding. 2. Moderate atheromatous change about the carotid bifurcations without hemodynamically significant greater than 50% stenosis. 3. 2 mm outpouching extending laterally from the proximal cavernous left ICA, suspicious for a small aneurysm. 4. Parenchymal opacities within the partially visualized right lung,  suspicious for pneumonia. 5. Layering bilateral pleural effusions, right greater than left. 6. Few small pulmonary nodules measuring up to 7 mm at the right lung apex, stable from prior. Continued surveillance recommended.   ASSESSMENT AND PLAN  72 y.o. year old male with Lung cancer with metastasis to lymph nodes, GIST, chronic diarrhea, CAD found to have punctate strokes during his last admission for sepsis and pneumonia.  He has completed 3 weeks of DAPT and currently on aspirin alone with statin.  He has marked improvement in terms of his mental status, he is no longer encephalopathic.  Clinically, he does not have any focal neurological deficit.  Plan will be for patient to continue with aspirin and statin, continue to take his current medications and follow-up with his doctors.  Advised him to contact me for any other concerns or questions.  Both patient and wife voiced understanding.  1. Cerebrovascular accident (CVA) due to occlusion of other cerebral artery [facility])     Patient Instructions  Continue with aspirin alone Continue with statin Continue with your other medications Continue to follow with PCP and your other doctors  Return as needed.  No orders of the defined types were placed in this encounter.   No orders of the defined types were placed in this encounter.   Return if symptoms worsen or fail to improve.  I have spent a total of 50 minutes dedicated to this patient today, preparing to see patient, performing a medically appropriate examination and evaluation, ordering tests and/or medications and procedures, and counseling and educating the patient/family/caregiver; independently interpreting result and communicating results to the family/patient/caregiver; and documenting clinical information in the electronic medical record.   Windell Norfolk, MD 09/03/2022, 4:26 PM  [facility] [address] [place], [place] [address] [phone]

## 2022-09-03 NOTE — Patient Instructions (Addendum)
Continue with aspirin alone Continue with statin Continue with your other medications Continue to follow with PCP and your other doctors  Return as needed.

## 2022-09-04 ENCOUNTER — Ambulatory Visit: Payer: Medicare Other | Admitting: Physical Therapy

## 2022-09-04 DIAGNOSIS — R279 Unspecified lack of coordination: Secondary | ICD-10-CM | POA: Diagnosis not present

## 2022-09-04 DIAGNOSIS — M6281 Muscle weakness (generalized): Secondary | ICD-10-CM

## 2022-09-04 DIAGNOSIS — R293 Abnormal posture: Secondary | ICD-10-CM

## 2022-09-04 DIAGNOSIS — M62838 Other muscle spasm: Secondary | ICD-10-CM | POA: Diagnosis not present

## 2022-09-04 NOTE — Therapy (Signed)
OUTPATIENT PHYSICAL THERAPY MALE PELVIC TREATMENT   Patient Name: Jesse [name] MRN: [id number] DOB:07-31-1950, 72 y.o., male Today's Date: 09/04/2022  END OF SESSION:  PT End of Session - 09/04/22 1659     Visit Number 5    Date for PT Re-Evaluation 10/30/22    Authorization Type [employer]    PT Start Time 1616    PT Stop Time 1650   pt had to leave early   PT Time Calculation (min) 34 min    Activity Tolerance Patient tolerated treatment well    Behavior During Therapy WFL for tasks assessed/performed                 Past Medical History:  Diagnosis Date   Anemia    Cancer (HCC)    bladder cancer   COPD (chronic obstructive pulmonary disease) (HCC)    mild    DIVERTICULOSIS, COLON 01/14/2007   Qualifier: Diagnosis of  By: Marcelyn Ditty RN, Katy Fitch    GASTRITIS, CHRONIC 06/01/2004   Qualifier: Diagnosis of  By: Creta Levin CMA (AAMA), Robin     GERD 01/09/2007   Qualifier: Diagnosis of  By: Tawanna Cooler, RN, Ellen     Heart murmur    as a child; no murmur heard 12/14/19   History of radiation therapy    Mediastinum- 05/15/22-06/28/22-Dr. Antony Blackbird   HYPERLIPIDEMIA 01/09/2007   Qualifier: Diagnosis of  By: Tawanna Cooler RN, Alvino Chapel     Hypertension    Lung mass    left upper nodule   Macular degeneration    right eye   NEOPLASM, MALIGNANT, BLADDER, HX OF 2002   Qualifier: Diagnosis of  By: Creta Levin CMA (AAMA), Robin  / no chemo or radiation   OSTEOARTHRITIS 01/14/2007   Qualifier: Diagnosis of  By: Marcelyn Ditty, RN, Katy Fitch - knees   Rectal mass    Reflux esophagitis 06/01/2004   Qualifier: Diagnosis of  By: Creta Levin CMA (AAMA), Robin     Stroke [facility])    TOBACCO USER 02/16/2009   Qualifier: Diagnosis of  By: Amador Cunas  MD, Janett Labella    VITAMIN D DEFICIENCY 06/03/2007   Qualifier: Diagnosis of  By: Amador Cunas  MD, Janett Labella    Past Surgical History:  Procedure Laterality Date   BIOPSY  06/18/2022   Procedure: BIOPSY;  Surgeon: Shellia Cleverly, DO;  Location: [facility];   Service: Gastroenterology;;   BREAST BIOPSY Left 03/16/2022   [place] LT RADIOACTIVE SEED LOC 03/16/2022 [facility]   BRONCHIAL BIOPSY  10/13/2019   Procedure: BRONCHIAL BIOPSIES;  Surgeon: Josephine Igo, DO;  Location: [facility];  Service: Pulmonary;;   BRONCHIAL BRUSHINGS  10/13/2019   Procedure: BRONCHIAL BRUSHINGS;  Surgeon: Josephine Igo, DO;  Location: [facility];  Service: Pulmonary;;   BRONCHIAL NEEDLE ASPIRATION BIOPSY  10/13/2019   Procedure: BRONCHIAL NEEDLE ASPIRATION BIOPSIES;  Surgeon: Josephine Igo, DO;  [facility]: [facility];  Service: Pulmonary;;   BRONCHIAL NEEDLE ASPIRATION BIOPSY  04/11/2022   Procedure: BRONCHIAL NEEDLE ASPIRATION BIOPSIES;  Surgeon: Josephine Igo, DO;  Location: [facility];  Service: Cardiopulmonary;;   BRONCHIAL WASHINGS  10/13/2019   Procedure: BRONCHIAL WASHINGS;  Surgeon: Josephine Igo, DO;  [facility];  Service: Pulmonary;;   COLONOSCOPY     multiple   DIVERTING ILEOSTOMY N/A 12/14/2021   Procedure: DIVERTING ILEOSTOMY;  Surgeon: Karie Soda, MD;  Location: [facility];  Service: General;  Laterality: N/A;   ENDOBRONCHIAL ULTRASOUND Bilateral 04/11/2022   Procedure: ENDOBRONCHIAL ULTRASOUND;  Surgeon: Audie Box  L, DO;  Location: [facility];  Service: Cardiopulmonary;  Laterality: Bilateral;   ESOPHAGEAL DILATION  06/18/2022   Procedure: ESOPHAGEAL DILATION;  Surgeon: Shellia Cleverly, DO;  Location: [facility];  Service: Gastroenterology;;   ESOPHAGOGASTRODUODENOSCOPY (EGD) WITH PROPOFOL N/A 06/18/2022   Procedure: ESOPHAGOGASTRODUODENOSCOPY (EGD) WITH PROPOFOL;  Surgeon: Shellia Cleverly, DO;  Location: [facility];  Service: Gastroenterology;  Laterality: N/A;  Severe dysphagia/odynophagia ?radiation esophagitis versus oral candidiasis   ILEOSTOMY CLOSURE N/A 03/22/2022   Procedure: OPEN TAKEDOWN OF LOOP ILEOSTOMY;  Surgeon: Karie Soda, MD;  Location: [facility];  Service: General;  Laterality: N/A;  GEN w/ERAS  PATHWAY LOCAL   INTERCOSTAL NERVE BLOCK Left 12/16/2019   Procedure: INTERCOSTAL NERVE BLOCK;  Surgeon: Corliss Skains, MD;  Location: [facility];  Service: Thoracic;  Laterality: Left;   IR FLUORO RM 30-60 MIN  07/02/2022   KNEE SURGERY  07/20/2008   bilat.   NASAL SEPTUM SURGERY  1995   NODE DISSECTION Left 12/16/2019   Procedure: NODE DISSECTION;  Surgeon: Corliss Skains, MD;  Location: [facility];  Service: Thoracic;  Laterality: Left;   PEG PLACEMENT N/A 07/04/2022   Procedure: PERCUTANEOUS ENDOSCOPIC GASTROSTOMY (PEG) PLACEMENT;  Surgeon: Iva Boop, MD;  Location: [facility];  Service: Gastroenterology;  Laterality: N/A;   RADIOACTIVE SEED GUIDED AXILLARY SENTINEL LYMPH NODE Left 03/20/2022   Procedure: RADIOACTIVE SEED GUIDED LEFT AXILLARY SENTINEL LYMPH NODE BIOPSY;  Surgeon: Harriette Bouillon, MD;  [facility];  Service: General;  Laterality: Left;   RECTAL EXAM UNDER ANESTHESIA N/A 03/22/2022   Procedure: ANORECTAL EXAMINATION UNDER ANESTHESIA;  Surgeon: Karie Soda, MD;  Location: [facility];  Service: General;  Laterality: N/A;   TONSILLECTOMY     TRANSURETHRAL RESECTION OF BLADDER TUMOR  2002   UPPER GASTROINTESTINAL ENDOSCOPY     VIDEO BRONCHOSCOPY  04/11/2022   Procedure: VIDEO BRONCHOSCOPY;  Surgeon: Josephine Igo, DO;  Location: [facility];  Service: Cardiopulmonary;;   VIDEO BRONCHOSCOPY WITH ENDOBRONCHIAL NAVIGATION N/A 10/13/2019   Procedure: VIDEO BRONCHOSCOPY WITH ENDOBRONCHIAL NAVIGATION;  Surgeon: Josephine Igo, DO;  [facility]: [facility];  Service: Pulmonary;  Laterality: N/A;   WISDOM TOOTH EXTRACTION     XI ROBOTIC ASSISTED LOWER ANTERIOR RESECTION N/A 12/14/2021   Procedure: XI ROBOTIC ASSISTED LOWER ANTERIOR ULTRA LOW RECTOSIGMOID RESECTION, COLOANAL HAND SEWN ANASTOMOSIS, AND BILATERAL TAP BLOCK;  Surgeon: Karie Soda, MD;  Location: [facility];  Service: General;  Laterality: N/A;   Patient Active Problem List   Diagnosis Date Noted   Acute ischemic  stroke, likely embolic 07/02/2022   Esophageal dysphagia 06/18/2022   Esophageal stricture 06/18/2022   Hiatal hernia 06/18/2022   Radiation-induced esophagitis 06/18/2022   Pancytopenia due to chemotherapy 06/07/2022   Protein-calorie malnutrition, severe 06/07/2022   Severe sepsis due to Klebsiella pneumonia and bacteremia 06/05/2022   AKI (acute kidney injury) (HCC) 06/05/2022   Hyponatremia 06/05/2022   Acute metabolic encephalopathy 06/05/2022   Acute respiratory failure with hypoxia (HCC) 06/05/2022   Acute on chronic diarrhea 06/05/2022   Weight loss 05/28/2022   Malignant neoplasm of lung (HCC) 04/30/2022   Adenopathy 04/06/2022   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   Encounter for antineoplastic chemotherapy 01/28/2020   Adenocarcinoma of left lung, stage 2 (HCC) 12/31/2019   Goals of care, counseling/discussion 12/31/2019   S/P lobectomy of lung 12/16/2019   Lung nodule 10/13/2019   Essential hypertension 09/28/2016   Coronary artery calcification 09/28/2016   Impaired glucose tolerance 09/28/2016  NEOPLASM, MALIGNANT, BLADDER, HX OF 07/18/2007   Vitamin D deficiency 06/03/2007   DIVERTICULOSIS, COLON 01/14/2007   Osteoarthritis 01/14/2007   Dyslipidemia 01/09/2007   GERD 01/09/2007   REFLUX ESOPHAGITIS 06/01/2004   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    PCP: [name], MD   REFERRING PROVIDER: Karie Soda, MD   REFERRING DIAG: R15.2 (ICD-10-CM) - Rectal urgency   THERAPY DIAG:  Muscle weakness (generalized)  Other muscle spasm  Unspecified lack of coordination  Abnormal posture  Rationale for Evaluation and Treatment: Rehabilitation  ONSET DATE: 03-2022 ostomy reversal and worse since that time  SUBJECTIVE:                                                                                                                                                                                           SUBJECTIVE  STATEMENT: Pt states exercises feel good.  Fecal frequency is still the same, gets an hour of sleep at night  Fluid intake: 6x 12 oz bottles water.  Pt has feeding tube in place and takes pint with each feeding via the feeding tube, drinking milk  PAIN:  Are you having pain? Yes NPRS scale: 5ish/10 Pain location: Anal  Pain type: burning Pain description: intermittent   Aggravating factors: bowel movements Relieving factors: nothing a little better with the butt paste  PRECAUTIONS: None  WEIGHT BEARING RESTRICTIONS: No  FALLS:  Has patient fallen in last 6 months? No  LIVING ENVIRONMENT: Lives with: lives with their spouse Lives in: House/apartment   OCCUPATION: no  PLOF: Independent  PATIENT GOALS: be able to work in the yard, want to play golf this summer  PERTINENT HISTORY:  lung cancer, bladder cancer, GIST, HLD, HTN, CVA, CAD, GERD, OA    BOWEL MOVEMENT: Pain with bowel movement: Yes Type of bowel movement:Type (Bristol Stool Scale) normal, Frequency 30 min or more, and Strain Yes sometimes Fully empty rectum: No Leakage: Yes: on the way to the bathroom Pads: Yes: diapers 4-5/day Fiber supplement: Yes:    URINATION: Pain with urination: No Fully empty bladder: Yes:    Leakage:  no Pads: No  INTERCOURSE: Not currently   OBJECTIVE:     PATIENT SURVEYS:    PFIQ-7   COGNITION: Overall cognitive status: Within functional limits for tasks assessed     SENSATION: Light touch: Appears intact Proprioception:   MUSCLE LENGTH: Hamstrings:   LUMBAR SPECIAL TESTS:    FUNCTIONAL TESTS:    GAIT: slow cadence, flexed posture   POSTURE: rounded shoulders, forward head, and increased thoracic kyphosis  PELVIC ALIGNMENT:  LUMBARAROM/PROM:  A/PROM A/PROM  eval  Flexion   Extension  Right lateral flexion   Left lateral flexion   Right rotation   Left rotation    (Blank rows = not tested)  LOWER EXTREMITY AROM/PROM:  A/PROM  Right eval Left eval  Hip flexion    Hip extension    Hip abduction    Hip adduction    Hip internal rotation    Hip external rotation    Knee flexion    Knee extension    Ankle dorsiflexion    Ankle plantarflexion    Ankle inversion    Ankle eversion     (Blank rows = not tested)  LOWER EXTREMITY MMT:  MMT Right eval Left eval  Hip flexion    Hip extension    Hip abduction    Hip adduction    Hip internal rotation    Hip external rotation    Knee flexion    Knee extension    Ankle dorsiflexion    Ankle plantarflexion    Ankle inversion    Ankle eversion     PALPATION: GENERAL generally low muscle tone              External Perineal Exam anal wink reflex present              Internal Pelvic Floor tight and dyssynergic bulging Patient confirms identification and approves PT to assess internal pelvic floor and treatment Yes - exam to posterior pelvic floor rectally  PELVIC MMT:   MMT eval  Internal Anal Sphincter 2/5  External Anal Sphincter 2/5  Puborectalis 2/5  Diastasis Recti   (Blank rows = not tested)  TONE: high  TODAY'S TREATMENT:                                                                                                                              DATE: 09/04/22   Exercise: Sidelying hip abduction 10x bil Sidelying reverse clam10x bil Bridges 20x  Manual: Erectors along spine for improved mobility Internal anal sphincter and puborectalis inferiorly Patient confirms identification and approves physical therapist to perform internal soft tissue work         PATIENT EDUCATION:  Education details:  Access Code: [email or web address] Person educated: Patient and Spouse Education method: Programmer, multimedia, Facilities manager, Actor cues, Verbal cues, and Handouts Education comprehension: verbalized understanding and needs further education  HOME EXERCISE PROGRAM: Access Code: [email or web address] URL: [email or web address] Date: 08/15/2022 Prepared  by: Dwana Curd  Exercises - Supine Hip Internal and External Rotation  - 1 x daily - 7 x weekly - 1 sets - 10 reps - 5 sec hold - Supine Piriformis Stretch with Leg Straight  - 1 x daily - 7 x weekly - 1 sets - 3 reps - 30 sec hold - Supine Figure 4 Piriformis Stretch  - 1 x daily - 7 x weekly - 1 sets - 3 reps - 30 sec hold - Supine Double Knee to Chest  - 1 x daily - 7 x weekly -  1 sets - 3 reps - 30 hold - Modified Cat Cow on Counter  - 1 x daily - 7 x weekly - 3 sets - 10 reps - Standing Hip Circles  - 1 x daily - 7 x weekly - 3 sets - 10 reps  ASSESSMENT:  CLINICAL IMPRESSION: Pt reports one good day this week which was an improvement and will hopefully trend in the right direction.  Today's session continued to focused on improved mobility throughout the hips and spine for improved ability to stretch into the pelvic floor.  Today also focused on improved gluteal strength and pt able to add glute bridges to HEP.  Pt will continue to benefit from skilled Pt to address muskuloskeletal impairments for reduction in difficulty with bowel movements.   OBJECTIVE IMPAIRMENTS: decreased activity tolerance, decreased coordination, decreased endurance, decreased ROM, decreased strength, increased muscle spasms, impaired flexibility, impaired tone, postural dysfunction, and pain.   ACTIVITY LIMITATIONS: sitting, sleeping, continence, and toileting  PARTICIPATION LIMITATIONS: interpersonal relationship, community activity, and yard work  PERSONAL FACTORS: 3+ comorbidities: lung cancer, bladder cancer, GIST, HLD, HTN, CVA, CAD, GERD, OA , ileostomy reversal, feeding tube  are also affecting patient's functional outcome.   REHAB POTENTIAL: Good  CLINICAL DECISION MAKING: Evolving/moderate complexity  EVALUATION COMPLEXITY: Moderate   GOALS: Goals reviewed with patient? Yes  SHORT TERM GOALS: Target date: 09/04/22 Updated 08/21/22  Pt will be ind with toileting  techniques Baseline: Goal status: MET  2.  Pt will report 25% less anal pain due to improved skin hygiene and more complete bowel movements Baseline:  Goal status: NOT MET  3.  Pt will be ind with initial HEP Baseline:  Goal status: MET    LONG TERM GOALS: Target date: 10/30/22 Updated 09/04/22  Pt will be independent with advanced HEP to maintain improvements made throughout therapy  Baseline:  Goal status: IN PROGRESS  2.  Pt will report 80% reduction of pain due to improvements in posture, strength, and muscle length  Baseline:  Goal status: IN PROGRESS  3.  Pt will be able to functional actions such as walking to the bathroom without leakage at least 80% of the time for reduced use of pads/diapers Baseline: 5 or more diapers per day Goal status: IN PROGRESS  4.  Pt will be able to reduce number of bowel movements per day to 3-5 due to more complete emptying Baseline: 60/day Goal status: IN PROGRESS  5.  Pt will be able to sleep at least 4 consecutive hours with waking 1-2x at most to use the bathroom Baseline:  Goal status: IN PROGRESS     PLAN:  PT FREQUENCY: 1-2x/week  PT DURATION: 12 weeks  PLANNED INTERVENTIONS: Therapeutic exercises, Therapeutic activity, Neuromuscular re-education, Balance training, Gait training, Patient/Family education, Self Care, Joint mobilization, Dry Needling, Electrical stimulation, Cryotherapy, Moist heat, Taping, Traction, Biofeedback, Manual therapy, and Re-evaluation  PLAN FOR NEXT SESSION:  STM to erectors throughout spine, core and posture strength, hip hinging and squats for functional strength, vibration plate stretches   [employer], PT 09/04/2022, 5:03 PM

## 2022-09-07 ENCOUNTER — Telehealth: Payer: Self-pay | Admitting: Internal Medicine

## 2022-09-07 NOTE — Telephone Encounter (Signed)
Rescheduled 07/10  due to provider on-call, patient is notified.

## 2022-09-12 ENCOUNTER — Ambulatory Visit (INDEPENDENT_AMBULATORY_CARE_PROVIDER_SITE_OTHER): Payer: Medicare Other

## 2022-09-12 VITALS — Ht 70.0 in | Wt 142.0 lb

## 2022-09-12 DIAGNOSIS — M6281 Muscle weakness (generalized): Secondary | ICD-10-CM | POA: Diagnosis not present

## 2022-09-12 DIAGNOSIS — Z0000 Encounter for general adult medical examination without abnormal findings: Secondary | ICD-10-CM

## 2022-09-12 DIAGNOSIS — E43 Unspecified severe protein-calorie malnutrition: Secondary | ICD-10-CM | POA: Diagnosis not present

## 2022-09-12 DIAGNOSIS — R1314 Dysphagia, pharyngoesophageal phase: Secondary | ICD-10-CM | POA: Diagnosis not present

## 2022-09-12 DIAGNOSIS — G9340 Encephalopathy, unspecified: Secondary | ICD-10-CM | POA: Diagnosis not present

## 2022-09-12 DIAGNOSIS — Z8673 Personal history of transient ischemic attack (TIA), and cerebral infarction without residual deficits: Secondary | ICD-10-CM | POA: Diagnosis not present

## 2022-09-12 NOTE — Patient Instructions (Addendum)
Jesse [name] , Thank you for taking time to come for your Medicare Wellness Visit. I appreciate your ongoing commitment to your health goals. Please review the following plan we discussed and let me know if I can assist you in the future.   These are the goals we discussed:  Goals       Increase physical activity (pt-stated)      Play Golf and gardening.      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   You won't feel high blood pressure, but it can still hurt your blood vessels.  High blood pressure can cause heart or kidney problems. It can also cause a stroke.  Making lifestyle changes like losing a little weight or eating less salt will help.  Checking your blood pressure at home and at different times of the day can help to control blood pressure.  If the doctor prescribes medicine remember to take it the way the doctor ordered.  Call the office if you cannot afford the medicine or if there are questions about it.     Notes:         This is a list of the screening recommended for you and due dates:  Health Maintenance  Topic Date Due   COVID-19 Vaccine (7 - 2023-24 season) 09/28/2022*   DTaP/Tdap/Td vaccine (2 - Td or Tdap) 10/08/2022   Flu Shot  11/15/2022   Pneumonia Vaccine  Completed   Hepatitis C Screening  Completed   Zoster (Shingles) Vaccine  Completed   HPV Vaccine  Aged Out  *Topic was postponed. The date shown  is not the original due date.    Advanced directives: In Chart  Conditions/risks identified: None  Next appointment: Follow up in one year for your annual wellness visit.   Preventive Care 107 Years and Older, Male  Preventive care refers to lifestyle choices and visits with your health care provider that can promote health and wellness. What does preventive care include? A yearly physical exam. This is also called an annual well check. Dental exams once or twice a year. Routine eye exams. Ask your health care provider how often you should have your eyes checked. Personal lifestyle choices, including: Daily care of your teeth and gums. Regular physical activity. Eating a healthy diet. Avoiding tobacco and drug use. Limiting alcohol use. Practicing safe sex. Taking low doses of aspirin every day. Taking vitamin and mineral supplements as recommended by your health care provider. What happens during an annual well check?  The services and screenings done by your health care provider during your annual well check will depend on your age, overall health, lifestyle risk factors, and family history of disease. Counseling  Your health care provider may ask you questions about your: Alcohol use. Tobacco use. Drug use. Emotional well-being. Home and relationship well-being. Sexual activity. Eating habits. History of falls. Memory and ability to understand (cognition). Work and work Astronomer. Screening  You may have the following tests or measurements: Height, weight, and BMI. Blood pressure. Lipid and cholesterol levels. These may be checked every 5 years, or more frequently if you are over 40 years old. Skin check. Lung cancer screening. You may have this screening every year starting at age 67 if you have a 30-pack-year history of smoking and currently smoke or have quit within the past 15 years. Fecal occult blood test (FOBT) of the stool. You may have this test every year  starting at age 81. Flexible sigmoidoscopy or colonoscopy. You may have a sigmoidoscopy every 5 years or a colonoscopy every 10 years starting at age 77. Prostate cancer screening. Recommendations will vary depending on your family history and other risks. Hepatitis C blood test. Hepatitis B blood test. Sexually transmitted disease (STD) testing. Diabetes screening. This is done by checking your blood sugar (glucose) after you have not eaten for a while (fasting). You may have this done every 1-3 years. Abdominal aortic aneurysm (AAA) screening. You may need this if you are a current or former smoker. Osteoporosis. You may be screened starting at age 91 if you are at high risk. Talk with your health care provider about your test results, treatment options, and if necessary, the need for more tests. Vaccines  Your health care provider may recommend certain vaccines, such as: Influenza vaccine. This is recommended every year. Tetanus, diphtheria, and acellular pertussis (Tdap, Td) vaccine. You may need a Td booster every 10 years. Zoster vaccine. You may need this after age 63. Pneumococcal 13-valent conjugate (PCV13) vaccine. One dose is recommended after age 103. Pneumococcal polysaccharide (PPSV23) vaccine. One dose is recommended after age 27. Talk to your health care provider about which screenings and vaccines you need and how often you need them. This information is not intended to replace advice given to you by your health care provider. Make sure you discuss any questions you have with your health care provider. Document Released: 04/29/2015 Document Revised: 12/21/2015 Document Reviewed: 02/01/2015 Elsevier Interactive Patient Education  2017 [employer].  Fall Prevention in the Home Falls can cause injuries. They can happen to people of all ages. There are many things you can do to make your home safe and to help prevent falls. What can I do on the outside of my home? Regularly fix  the edges of walkways and driveways and fix any cracks. Remove anything that might make you trip as you walk through a door, such as a raised step or threshold. Trim any bushes or trees on the path to your home. Use bright outdoor lighting. Clear any walking paths of anything that might make someone trip, such as rocks or tools. Regularly check to see if handrails are loose or broken. Make sure that both sides of any steps have handrails. Any raised decks and porches should have guardrails on the edges. Have any leaves, snow, or ice cleared regularly. Use sand or salt on walking paths during winter. Clean up any spills in your garage right away. This includes oil or grease spills. What can I  do in the bathroom? Use night lights. Install grab bars by the toilet and in the tub and shower. Do not use towel bars as grab bars. Use non-skid mats or decals in the tub or shower. If you need to sit down in the shower, use a plastic, non-slip stool. Keep the floor dry. Clean up any water that spills on the floor as soon as it happens. Remove soap buildup in the tub or shower regularly. Attach bath mats securely with double-sided non-slip rug tape. Do not have throw rugs and other things on the floor that can make you trip. What can I do in the bedroom? Use night lights. Make sure that you have a light by your bed that is easy to reach. Do not use any sheets or blankets that are too big for your bed. They should not hang down onto the floor. Have a firm chair that has side arms. You can use this for support while you get dressed. Do not have throw rugs and other things on the floor that can make you trip. What can I do in the kitchen? Clean up any spills right away. Avoid walking on wet floors. Keep items that you use a lot in easy-to-reach places. If you need to reach something above you, use a strong step stool that has a grab bar. Keep electrical cords out of the way. Do not use floor polish  or wax that makes floors slippery. If you must use wax, use non-skid floor wax. Do not have throw rugs and other things on the floor that can make you trip. What can I do with my stairs? Do not leave any items on the stairs. Make sure that there are handrails on both sides of the stairs and use them. Fix handrails that are broken or loose. Make sure that handrails are as long as the stairways. Check any carpeting to make sure that it is firmly attached to the stairs. Fix any carpet that is loose or worn. Avoid having throw rugs at the top or bottom of the stairs. If you do have throw rugs, attach them to the floor with carpet tape. Make sure that you have a light switch at the top of the stairs and the bottom of the stairs. If you do not have them, ask someone to add them for you. What else can I do to help prevent falls? Wear shoes that: Do not have high heels. Have rubber bottoms. Are comfortable and fit you well. Are closed at the toe. Do not wear sandals. If you use a stepladder: Make sure that it is fully opened. Do not climb a closed stepladder. Make sure that both sides of the stepladder are locked into place. Ask someone to hold it for you, if possible. Clearly mark and make sure that you can see: Any grab bars or handrails. First and last steps. Where the edge of each step is. Use tools that help you move around (mobility aids) if they are needed. These include: Canes. Walkers. Scooters. Crutches. Turn on the lights when you go into a dark area. Replace any light bulbs as soon as they burn out. Set up your furniture so you have a clear path. Avoid moving your furniture around. If any of your floors are uneven, fix them. If there are any pets around you, be aware of where they are. Review your medicines with your doctor. Some medicines can make you feel dizzy. This can increase your chance of falling. Ask your  doctor what other things that you can do to help prevent  falls. This information is not intended to replace advice given to you by your health care provider. Make sure you discuss any questions you have with your health care provider. Document Released: 01/27/2009 Document Revised: 09/08/2015 Document Reviewed: 05/07/2014 Elsevier Interactive Patient Education  2017 [employer].

## 2022-09-12 NOTE — Progress Notes (Signed)
Subjective:   Jesse [name] is a 72 y.o. male who presents for Medicare Annual/Subsequent preventive examination.  Review of Systems    Virtual Visit via Telephone Note  I connected with  Jesse [name] on 09/12/22 at  3:30 PM EDT by telephone and verified that I am speaking with the correct person using two identifiers.  Location: Patient: Home Provider: Office Persons participating in the virtual visit: patient/Nurse Health Advisor   I discussed the limitations, risks, security and privacy concerns of performing an evaluation and management service by telephone and the availability of in person appointments. The patient expressed understanding and agreed to proceed.  Interactive audio and video telecommunications were attempted between this nurse and patient, however failed, due to patient having technical difficulties OR patient did not have access to video capability.  We continued and completed visit with audio only.  Some vital signs may be absent or patient reported.   Jesse Rung, LPN  Cardiac Risk Factors include: advanced age (>41men, >46 women);male gender;hypertension     Objective:    Today's Vitals   09/12/22 1540  Weight: 142 lb (64.4 kg)  Height: 5\' 10"  (1.778 m)   Body mass index is 20.37 kg/m.     09/12/2022    3:52 PM 08/07/2022    2:10 PM 08/06/2022   12:48 PM 07/30/2022   11:28 AM 07/04/2022   12:39 PM 06/18/2022    1:00 PM 06/18/2022    9:27 AM  Advanced Directives  Does Patient Have a Medical Advance Directive? Yes Yes No Yes  Yes Yes  Type of Estate agent of [place];Living will Living will  Living will Healthcare Power of [place];Living will Healthcare Power of [employer] Power of Attorney  Does patient want to make changes to medical advance directive? No - Patient declined     No - Patient declined   Copy of Healthcare Power of Attorney in Chart? Yes - validated most recent copy scanned in chart (See row  information) Yes - validated most recent copy scanned in chart (See row information)  Yes - validated most recent copy scanned in chart (See row information)  No - copy requested No - copy requested  Would patient like information on creating a medical advance directive?   No - Patient declined        Current Medications (verified) Outpatient Encounter Medications as of 09/12/2022  Medication Sig   acetaminophen (TYLENOL) 500 MG tablet Take 1,000 mg by mouth every 8 (eight) hours as needed for moderate pain.   amLODipine (NORVASC) 5 MG tablet Take 1 tablet (5 mg total) by mouth daily.   aspirin 81 MG chewable tablet Chew 1 tablet (81 mg total) by mouth daily.   atorvastatin (LIPITOR) 40 MG tablet Take 1/2 tablet (20 mg total) by mouth daily.   Cholecalciferol (VITAMIN D3 PO) Take 2,000 Units by mouth daily.   cholestyramine (QUESTRAN) 4 GM/DOSE powder Take 4 g by mouth 2 (two) times daily.   diphenoxylate-atropine (LOMOTIL) 2.5-0.025 MG tablet Take 1 tablet by mouth 4 (four) times daily as needed for diarrhea or loose stools.   ferrous sulfate 325 (65 FE) MG EC tablet Take 1 tablet (325 mg total) by mouth 2 (two) times daily.   imatinib (GLEEVEC) 400 MG tablet Take 1 tablet (400 mg total) by mouth daily. Take with meals and large glass of water.Caution:Chemotherapy. (Patient taking differently: Take 400 mg by mouth at bedtime. Take with meals and large glass of water.Caution:Chemotherapy.)  lidocaine (XYLOCAINE) 2 % solution Use as directed 15 mLs in the mouth or throat 4 (four) times daily -  before meals and at bedtime.   lidocaine (XYLOCAINE) 5 % ointment Apply 1 Application topically 3 (three) times daily as needed for mild pain or moderate pain.   loperamide (IMODIUM A-D) 2 MG tablet Take 4 mg by mouth 4 (four) times daily as needed for diarrhea or loose stools.   mirtazapine (REMERON) 15 MG tablet Take 1 tablet (15 mg total) by mouth at bedtime.   Multiple Vitamins-Minerals (PRESERVISION  AREDS 2) CAPS Take 1 capsule by mouth 2 (two) times daily.   Nutritional Supplements (FEEDING SUPPLEMENT, JEVITY 1.5 CAL/FIBER,) LIQD Change TF to 1 carton Osmolite 1.5 + 1 carton Jevity 1.5 with 60 mL free water before and after feedings TID via PEG.   Nutritional Supplements (FEEDING SUPPLEMENT, OSMOLITE 1.5 CAL,) LIQD Change TF to 1 carton Osmolite 1.5 + 1 carton Jevity 1.5 with 60 mL free water before and after feedings TID via PEG.   pantoprazole (PROTONIX) 40 MG tablet Take 1 tablet (40 mg total) by mouth 2 (two) times daily.   sucralfate (CARAFATE) 1 g tablet Take 1 tablet by mouth 4 times daily -  with meals and at bedtime.   No facility-administered encounter medications on file as of 09/12/2022.    Allergies (verified) Patient has no known allergies.   History: Past Medical History:  Diagnosis Date   Anemia    Cancer (HCC)    bladder cancer   COPD (chronic obstructive pulmonary disease) (HCC)    mild    DIVERTICULOSIS, COLON 01/14/2007   Qualifier: Diagnosis of  By: Marcelyn Ditty RN, Katy Fitch    GASTRITIS, CHRONIC 06/01/2004   Qualifier: Diagnosis of  By: Creta Levin CMA (AAMA), Robin     GERD 01/09/2007   Qualifier: Diagnosis of  By: Tawanna Cooler, RN, Ellen     Heart murmur    as a child; no murmur heard 12/14/19   History of radiation therapy    Mediastinum- 05/15/22-06/28/22-Dr. Antony Blackbird   HYPERLIPIDEMIA 01/09/2007   Qualifier: Diagnosis of  By: Everett Graff     Hypertension    Lung mass    left upper nodule   Macular degeneration    right eye   NEOPLASM, MALIGNANT, BLADDER, HX OF 2002   Qualifier: Diagnosis of  By: Creta Levin CMA (AAMA), Robin  / no chemo or radiation   OSTEOARTHRITIS 01/14/2007   Qualifier: Diagnosis of  By: Marcelyn Ditty, RN, Katy Fitch - knees   Rectal mass    Reflux esophagitis 06/01/2004   Qualifier: Diagnosis of  By: Creta Levin CMA (AAMA), Robin     Stroke [facility])    TOBACCO USER 02/16/2009   Qualifier: Diagnosis of  By: Amador Cunas  MD, Janett Labella    VITAMIN D  DEFICIENCY 06/03/2007   Qualifier: Diagnosis of  By: Amador Cunas  MD, Janett Labella    Past Surgical History:  Procedure Laterality Date   BIOPSY  06/18/2022   Procedure: BIOPSY;  Surgeon: Shellia Cleverly, DO;  Location: [facility];  Service: Gastroenterology;;   BREAST BIOPSY Left 03/16/2022   [place] LT RADIOACTIVE SEED LOC 03/16/2022 [facility]   BRONCHIAL BIOPSY  10/13/2019   Procedure: BRONCHIAL BIOPSIES;  Surgeon: Josephine Igo, DO;  [facility];  Service: Pulmonary;;   BRONCHIAL BRUSHINGS  10/13/2019   Procedure: BRONCHIAL BRUSHINGS;  Surgeon: Josephine Igo, DO;  [facility];  Service: Pulmonary;;   BRONCHIAL NEEDLE ASPIRATION BIOPSY  10/13/2019   Procedure: BRONCHIAL NEEDLE ASPIRATION BIOPSIES;  Surgeon: Josephine Igo, DO;  [facility];  Service: Pulmonary;;   BRONCHIAL NEEDLE ASPIRATION BIOPSY  04/11/2022   Procedure: BRONCHIAL NEEDLE ASPIRATION BIOPSIES;  Surgeon: Josephine Igo, DO;  [facility];  Service: Cardiopulmonary;;   BRONCHIAL WASHINGS  10/13/2019   Procedure: BRONCHIAL WASHINGS;  Surgeon: Josephine Igo, DO;  [facility];  Service: Pulmonary;;   COLONOSCOPY     multiple   DIVERTING ILEOSTOMY N/A 12/14/2021   Procedure: DIVERTING ILEOSTOMY;  Surgeon: Karie Soda, MD;  [facility];  Service: General;  Laterality: N/A;   ENDOBRONCHIAL ULTRASOUND Bilateral 04/11/2022   Procedure: ENDOBRONCHIAL ULTRASOUND;  Surgeon: Josephine Igo, DO;  [facility];  Service: Cardiopulmonary;  Laterality: Bilateral;   ESOPHAGEAL DILATION  06/18/2022   Procedure: ESOPHAGEAL DILATION;  Surgeon: Shellia Cleverly, DO;  [facility];  Service: Gastroenterology;;   ESOPHAGOGASTRODUODENOSCOPY (EGD) WITH PROPOFOL N/A 06/18/2022   Procedure: ESOPHAGOGASTRODUODENOSCOPY (EGD) WITH PROPOFOL;  Surgeon: Shellia Cleverly, DO;  [facility];  Service: Gastroenterology;  Laterality: N/A;  Severe  dysphagia/odynophagia ?radiation esophagitis versus oral candidiasis   ILEOSTOMY CLOSURE N/A 03/22/2022   Procedure: OPEN TAKEDOWN OF LOOP ILEOSTOMY;  Surgeon: Karie Soda, MD;  [facility];  Service: General;  Laterality: N/A;  GEN w/ERAS PATHWAY LOCAL   INTERCOSTAL NERVE BLOCK Left 12/16/2019   Procedure: INTERCOSTAL NERVE BLOCK;  Surgeon: Corliss Skains, MD;  [facility];  Service: Thoracic;  Laterality: Left;   IR FLUORO RM 30-60 MIN  07/02/2022   KNEE SURGERY  07/20/2008   bilat.   NASAL SEPTUM SURGERY  1995   NODE DISSECTION Left 12/16/2019   Procedure: NODE DISSECTION;  Surgeon: Corliss Skains, MD;  [facility];  Service: Thoracic;  Laterality: Left;   PEG PLACEMENT N/A 07/04/2022   Procedure: PERCUTANEOUS ENDOSCOPIC GASTROSTOMY (PEG) PLACEMENT;  Surgeon: Iva Boop, MD;  [facility];  Service: Gastroenterology;  Laterality: N/A;   RADIOACTIVE SEED GUIDED AXILLARY SENTINEL LYMPH NODE Left 03/20/2022   Procedure: RADIOACTIVE SEED GUIDED LEFT AXILLARY SENTINEL LYMPH NODE BIOPSY;  Surgeon: Harriette Bouillon, MD;  [facility];  Service: General;  Laterality: Left;   RECTAL EXAM UNDER ANESTHESIA N/A 03/22/2022   Procedure: ANORECTAL EXAMINATION UNDER ANESTHESIA;  Surgeon: Karie Soda, MD;  [facility];  Service: General;  Laterality: N/A;   TONSILLECTOMY     TRANSURETHRAL RESECTION OF BLADDER TUMOR  2002   UPPER GASTROINTESTINAL ENDOSCOPY     VIDEO BRONCHOSCOPY  04/11/2022   Procedure: VIDEO BRONCHOSCOPY;  Surgeon: Josephine Igo, DO;  [facility];  Service: Cardiopulmonary;;   VIDEO BRONCHOSCOPY WITH ENDOBRONCHIAL NAVIGATION N/A 10/13/2019   Procedure: VIDEO BRONCHOSCOPY WITH ENDOBRONCHIAL NAVIGATION;  Surgeon: Josephine Igo, DO;  [facility];  Service: Pulmonary;  Laterality: N/A;   WISDOM TOOTH EXTRACTION     XI ROBOTIC ASSISTED LOWER ANTERIOR RESECTION N/A 12/14/2021   Procedure: XI ROBOTIC ASSISTED LOWER ANTERIOR ULTRA  LOW RECTOSIGMOID RESECTION, COLOANAL HAND SEWN ANASTOMOSIS, AND BILATERAL TAP BLOCK;  Surgeon: Karie Soda, MD;  [facility];  Service: General;  Laterality: N/A;   Family History  Problem Relation Age of Onset   Dementia Mother    Diabetes Father    Mental illness Father    Peripheral vascular disease Brother    Social History   Socioeconomic History   Marital status: Married    Spouse name: Not on file   Number of  children: 3   Years of education: Not on file   Highest education level: Not on file  Occupational History    Comment: semi retired   Tobacco Use   Smoking status: Former    Packs/day: 0.50    Years: 40.00    Additional pack years: 0.00    Total pack years: 20.00    Types: Cigarettes    Quit date: 09/21/2019    Years since quitting: 2.9   Smokeless tobacco: Never  Vaping Use   Vaping Use: Never used  Substance and Sexual Activity   Alcohol use: Not Currently    Alcohol/week: 15.0 standard drinks of alcohol    Types: 15 Standard drinks or equivalent per week    Comment: wine/beer/martini   Drug use: Not Currently    Types: Marijuana    Comment: Smokes marijuana occasional, last use in 08/2019   Sexual activity: Not Currently  Other Topics Concern   Not on file  Social History Narrative   Married   3 children: 3 grandchildren; 3 great grandchildren   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   Enjoys yard work and Systems analyst   Social Determinants of Corporate investment banker Strain: Low Risk  (09/12/2022)   Overall Financial Resource Strain (CARDIA)    Difficulty of Paying Living Expenses: Not hard at all  Food Insecurity: No Food Insecurity (09/12/2022)   Hunger Vital Sign    Worried About Running Out of Food in the Last Year: Never true    Ran Out of Food in the Last Year: Never true  Transportation Needs: No Transportation Needs (09/12/2022)   PRAPARE - Administrator, Civil Service (Medical): No    Lack of Transportation  (Non-Medical): No  Physical Activity: Sufficiently Active (09/12/2022)   Exercise Vital Sign    Days of Exercise per Week: 7 days    Minutes of Exercise per Session: 30 min  Stress: No Stress Concern Present (09/12/2022)   [employer] of Occupational Health - Occupational Stress Questionnaire    Feeling of Stress : Not at all  Social Connections: Moderately Isolated (07/12/2020)   Social Connection and Isolation Panel [NHANES]    Frequency of Communication with Friends and Family: More than three times a week    Frequency of Social Gatherings with Friends and Family: More than three times a week    Attends Religious Services: Never    Database administrator or Organizations: No    Attends Engineer, structural: Never    Marital Status: Married    Tobacco Counseling Counseling given: Not Answered   Clinical Intake:  Pre-visit preparation completed: No  Pain : No/denies pain     BMI - recorded: 20.37 Nutritional Status: BMI of 19-24  Normal Nutritional Risks: None Diabetes: No  How often do you need to have someone help you when you read instructions, pamphlets, or other written materials from your doctor or pharmacy?: 1 - Never  Diabetic?  No  Interpreter Needed?: No  Information entered by :: Theresa Mulligan LPN   Activities of Daily Living    09/12/2022    3:47 PM 06/06/2022    1:00 AM  In your present state of health, do you have any difficulty performing the following activities:  Hearing? 0 0  Vision? 0 0  Difficulty concentrating or making decisions? 1 0  Comment Wife assist. Followed by medical attention   Walking or climbing stairs? 0 0  Dressing or bathing? 0  0  Doing errands, shopping? 0 0  Preparing Food and eating ? N   Using the Toilet? N   In the past six months, have you accidently leaked urine? Y   Comment Wears depends. Followed by Urologist   Do you have problems with loss of bowel control? Y   Comment Wears depends. Followed by  Gastrologist   Managing your Medications? Y   Comment Wife assist   Managing your Finances? N   Housekeeping or managing your Housekeeping? N     Patient Care Team: [name], MD as PCP - General (Family Medicine) Si Gaul, MD as Consulting Physician (Oncology) Verner Chol, [facility] (Inactive) as Pharmacist (Pharmacist) Iva Boop, MD as Consulting Physician (Gastroenterology) Josephine Igo, DO as Consulting Physician (Pulmonary Disease) Berneice Heinrich Delbert Phenix., MD as Consulting Physician (Urology) Karie Soda, MD as Consulting Physician (General Surgery)  Indicate any recent Medical Services you may have received from other than [facility] providers in the past year (date may be approximate).     Assessment:   This is a routine wellness examination for L[place].  Hearing/Vision screen Hearing Screening - Comments:: Denies hearing difficulties   Vision Screening - Comments:: Wears rx glasses - up to date with routine eye exams with  Deferred  Dietary issues and exercise activities discussed: Current Exercise Habits: Home exercise routine, Type of exercise: stretching;walking, Time (Minutes): 30, Frequency (Times/Week): 7, Weekly Exercise (Minutes/Week): 210, Intensity: Moderate, Exercise limited by: None identified   Goals Addressed               This Visit's Progress     Increase physical activity (pt-stated)        Play Golf and gardening.       Depression Screen    09/12/2022    3:46 PM 05/07/2022    7:53 AM 07/18/2021    9:16 AM 07/12/2020    8:56 AM 06/03/2019    8:31 AM 06/03/2019    8:26 AM 10/08/2017    9:35 AM  PHQ 2/9 Scores  PHQ - 2 Score 0 0 0 0 0 0 0    Fall Risk    09/12/2022    3:50 PM 08/01/2022    2:08 PM 07/18/2021    9:16 AM 07/14/2021    1:35 PM 07/12/2020    8:59 AM  Fall Risk   Falls in the past year? 0 0 0 0 0  Number falls in past yr: 0 0 0  0  Injury with Fall? 0 0 0  0  Risk for fall due to : No Fall Risks No Fall Risks  Medication side effect  Impaired vision  Follow up Falls prevention discussed Falls evaluation completed Falls evaluation completed;Education provided;Falls prevention discussed  Falls prevention discussed    FALL RISK PREVENTION PERTAINING TO THE HOME:  Any stairs in or around the home? Yes  If so, are there any without handrails? No  Home free of loose throw rugs in walkways, pet beds, electrical cords, etc? Yes  Adequate lighting in your home to reduce risk of falls? Yes   ASSISTIVE DEVICES UTILIZED TO PREVENT FALLS:  Life alert? No  Use of a cane, walker or w/c? No  Grab bars in the bathroom? Yes  Shower chair or bench in shower? Yes  Elevated toilet seat or a handicapped toilet? Yes   TIMED UP AND GO:  Was the test performed? No . Audio Visit   Cognitive Function:  09/12/2022    3:52 PM 07/18/2021    9:17 AM 07/12/2020    9:03 AM 06/03/2019    8:36 AM  6CIT Screen  What Year? 4 points 0 points 0 points 0 points  What month? 0 points 0 points 0 points 0 points  What time? 0 points 0 points  0 points  Count back from 20 0 points 0 points 0 points 0 points  Months in reverse 0 points 0 points 0 points 0 points  Repeat phrase 6 points 2 points 0 points 0 points  Total Score 10 points 2 points  0 points    Immunizations Immunization History  Administered Date(s) Administered   COVID-19, mRNA, vaccine(Comirnaty)12 years and older 03/14/2022   Fluad Quad(high Dose 65+) 01/16/2019, 02/25/2020, 03/14/2022   PFIZER Comirnaty(Gray Top)Covid-19 Tri-Sucrose Vaccine 11/09/2020   PFIZER(Purple Top)SARS-COV-2 Vaccination 06/07/2019, 06/30/2019, 03/17/2020   Pfizer Covid-19 Vaccine Bivalent Booster 66yrs & up 02/01/2021   Pneumococcal Conjugate-13 06/08/2015   Pneumococcal Polysaccharide-23 09/28/2016   Tdap 10/07/2012   Zoster Recombinat (Shingrix) 11/09/2020, 03/02/2021    TDAP status: Up to date  Flu Vaccine status: Up to date  Pneumococcal vaccine status: Up to  date  Covid-19 vaccine status: Completed vaccines  Qualifies for Shingles Vaccine? Yes   Zostavax completed Yes   Shingrix Completed?: Yes  Screening Tests Health Maintenance  Topic Date Due   COVID-19 Vaccine (7 - 2023-24 season) 09/28/2022 (Originally 05/09/2022)   DTaP/Tdap/Td (2 - Td or Tdap) 10/08/2022   INFLUENZA VACCINE  11/15/2022   Pneumonia Vaccine 96+ Years old  Completed   Hepatitis C Screening  Completed   Zoster Vaccines- Shingrix  Completed   HPV VACCINES  Aged Out    Health Maintenance  There are no preventive care reminders to display for this patient.     Lung Cancer Screening: (Low Dose CT Chest recommended if Age 53-80 years, 30 pack-year currently smoking OR have quit w/in 15years.) does not qualify.    Additional Screening:  Hepatitis C Screening: does qualify; Completed 09/28/16  Vision Screening: Recommended annual ophthalmology exams for early detection of glaucoma and other disorders of the eye. Is the patient up to date with their annual eye exam?  Yes  Who is the provider or what is the name of the office in which the patient attends annual eye exams? Deferred If pt is not established with a provider, would they like to be referred to a provider to establish care? No .   Dental Screening: Recommended annual dental exams for proper oral hygiene  Community Resource Referral / Chronic Care Management:  CRR required this visit?  No   CCM required this visit?  No      Plan:     I have personally reviewed and noted the following in the patient's chart:   Medical and social history Use of alcohol, tobacco or illicit drugs  Current medications and supplements including opioid prescriptions. Patient is not currently taking opioid prescriptions. Functional ability and status Nutritional status Physical activity Advanced directives List of other physicians Hospitalizations, surgeries, and ER visits in previous 12 months Vitals Screenings  to include cognitive, depression, and falls Referrals and appointments  In addition, I have reviewed and discussed with patient certain preventive protocols, quality metrics, and best practice recommendations. A written personalized care plan for preventive services as well as general preventive health recommendations were provided to patient.     Jesse Rung, LPN   12/13/5619   Nurse Notes:   None

## 2022-09-13 ENCOUNTER — Ambulatory Visit: Payer: Medicare Other | Admitting: Physical Therapy

## 2022-09-13 DIAGNOSIS — G9340 Encephalopathy, unspecified: Secondary | ICD-10-CM | POA: Diagnosis not present

## 2022-09-13 DIAGNOSIS — R279 Unspecified lack of coordination: Secondary | ICD-10-CM

## 2022-09-13 DIAGNOSIS — M62838 Other muscle spasm: Secondary | ICD-10-CM | POA: Diagnosis not present

## 2022-09-13 DIAGNOSIS — E43 Unspecified severe protein-calorie malnutrition: Secondary | ICD-10-CM | POA: Diagnosis not present

## 2022-09-13 DIAGNOSIS — Z8673 Personal history of transient ischemic attack (TIA), and cerebral infarction without residual deficits: Secondary | ICD-10-CM | POA: Diagnosis not present

## 2022-09-13 DIAGNOSIS — R293 Abnormal posture: Secondary | ICD-10-CM | POA: Diagnosis not present

## 2022-09-13 DIAGNOSIS — R1314 Dysphagia, pharyngoesophageal phase: Secondary | ICD-10-CM | POA: Diagnosis not present

## 2022-09-13 DIAGNOSIS — M6281 Muscle weakness (generalized): Secondary | ICD-10-CM | POA: Diagnosis not present

## 2022-09-13 NOTE — Therapy (Addendum)
OUTPATIENT PHYSICAL THERAPY MALE PELVIC TREATMENT   Patient Name: Jesse [name] MRN: [id number] DOB:12-25-1950, 72 y.o., male Today's Date: 09/13/2022  END OF SESSION:  PT End of Session - 09/13/22 1518     Visit Number 6    Date for PT Re-Evaluation 10/30/22    Authorization Type [employer]    PT Start Time 1451    PT Stop Time 1523    PT Time Calculation (min) 32 min    Activity Tolerance Patient tolerated treatment well    Behavior During Therapy WFL for tasks assessed/performed                  Past Medical History:  Diagnosis Date   Anemia    Cancer (HCC)    bladder cancer   COPD (chronic obstructive pulmonary disease) (HCC)    mild    DIVERTICULOSIS, COLON 01/14/2007   Qualifier: Diagnosis of  By: Marcelyn Ditty RN, Katy Fitch    GASTRITIS, CHRONIC 06/01/2004   Qualifier: Diagnosis of  By: Creta Levin CMA (AAMA), Robin     GERD 01/09/2007   Qualifier: Diagnosis of  By: Tawanna Cooler, RN, Ellen     Heart murmur    as a child; no murmur heard 12/14/19   History of radiation therapy    Mediastinum- 05/15/22-06/28/22-Dr. Antony Blackbird   HYPERLIPIDEMIA 01/09/2007   Qualifier: Diagnosis of  By: Tawanna Cooler RN, Alvino Chapel     Hypertension    Lung mass    left upper nodule   Macular degeneration    right eye   NEOPLASM, MALIGNANT, BLADDER, HX OF 2002   Qualifier: Diagnosis of  By: Creta Levin CMA (AAMA), Robin  / no chemo or radiation   OSTEOARTHRITIS 01/14/2007   Qualifier: Diagnosis of  By: Marcelyn Ditty, RN, Katy Fitch - knees   Rectal mass    Reflux esophagitis 06/01/2004   Qualifier: Diagnosis of  By: Creta Levin CMA (AAMA), Robin     Stroke [facility])    TOBACCO USER 02/16/2009   Qualifier: Diagnosis of  By: Amador Cunas  MD, Janett Labella    VITAMIN D DEFICIENCY 06/03/2007   Qualifier: Diagnosis of  By: Amador Cunas  MD, Janett Labella    Past Surgical History:  Procedure Laterality Date   BIOPSY  06/18/2022   Procedure: BIOPSY;  Surgeon: Shellia Cleverly, DO;  Location: [facility];  Service: Gastroenterology;;    BREAST BIOPSY Left 03/16/2022   [place] LT RADIOACTIVE SEED LOC 03/16/2022 [facility]   BRONCHIAL BIOPSY  10/13/2019   Procedure: BRONCHIAL BIOPSIES;  Surgeon: Josephine Igo, DO;  Location: [facility];  Service: Pulmonary;;   BRONCHIAL BRUSHINGS  10/13/2019   Procedure: BRONCHIAL BRUSHINGS;  Surgeon: Josephine Igo, DO;  Location: [facility];  Service: Pulmonary;;   BRONCHIAL NEEDLE ASPIRATION BIOPSY  10/13/2019   Procedure: BRONCHIAL NEEDLE ASPIRATION BIOPSIES;  Surgeon: Josephine Igo, DO;  [facility]: [facility];  Service: Pulmonary;;   BRONCHIAL NEEDLE ASPIRATION BIOPSY  04/11/2022   Procedure: BRONCHIAL NEEDLE ASPIRATION BIOPSIES;  Surgeon: Josephine Igo, DO;  Location: [facility];  Service: Cardiopulmonary;;   BRONCHIAL WASHINGS  10/13/2019   Procedure: BRONCHIAL WASHINGS;  Surgeon: Josephine Igo, DO;  [facility]: [facility];  Service: Pulmonary;;   COLONOSCOPY     multiple   DIVERTING ILEOSTOMY N/A 12/14/2021   Procedure: DIVERTING ILEOSTOMY;  Surgeon: Karie Soda, MD;  Location: [facility];  Service: General;  Laterality: N/A;   ENDOBRONCHIAL ULTRASOUND Bilateral 04/11/2022   Procedure: ENDOBRONCHIAL ULTRASOUND;  Surgeon: Josephine Igo, DO;  Location: [facility];  Service: Cardiopulmonary;  Laterality: Bilateral;   ESOPHAGEAL DILATION  06/18/2022   Procedure: ESOPHAGEAL DILATION;  Surgeon: Shellia Cleverly, DO;  Location: [facility];  Service: Gastroenterology;;   ESOPHAGOGASTRODUODENOSCOPY (EGD) WITH PROPOFOL N/A 06/18/2022   Procedure: ESOPHAGOGASTRODUODENOSCOPY (EGD) WITH PROPOFOL;  Surgeon: Shellia Cleverly, DO;  Location: [facility];  Service: Gastroenterology;  Laterality: N/A;  Severe dysphagia/odynophagia ?radiation esophagitis versus oral candidiasis   ILEOSTOMY CLOSURE N/A 03/22/2022   Procedure: OPEN TAKEDOWN OF LOOP ILEOSTOMY;  Surgeon: Karie Soda, MD;  Location: [facility];  Service: General;  Laterality: N/A;  GEN w/ERAS PATHWAY LOCAL    INTERCOSTAL NERVE BLOCK Left 12/16/2019   Procedure: INTERCOSTAL NERVE BLOCK;  Surgeon: Corliss Skains, MD;  Location: [facility];  Service: Thoracic;  Laterality: Left;   IR FLUORO RM 30-60 MIN  07/02/2022   KNEE SURGERY  07/20/2008   bilat.   NASAL SEPTUM SURGERY  1995   NODE DISSECTION Left 12/16/2019   Procedure: NODE DISSECTION;  Surgeon: Corliss Skains, MD;  [facility]: [facility];  Service: Thoracic;  Laterality: Left;   PEG PLACEMENT N/A 07/04/2022   Procedure: PERCUTANEOUS ENDOSCOPIC GASTROSTOMY (PEG) PLACEMENT;  Surgeon: Iva Boop, MD;  Location: [facility];  Service: Gastroenterology;  Laterality: N/A;   RADIOACTIVE SEED GUIDED AXILLARY SENTINEL LYMPH NODE Left 03/20/2022   Procedure: RADIOACTIVE SEED GUIDED LEFT AXILLARY SENTINEL LYMPH NODE BIOPSY;  Surgeon: Harriette Bouillon, MD;  [facility];  Service: General;  Laterality: Left;   RECTAL EXAM UNDER ANESTHESIA N/A 03/22/2022   Procedure: ANORECTAL EXAMINATION UNDER ANESTHESIA;  Surgeon: Karie Soda, MD;  Location: [facility];  Service: General;  Laterality: N/A;   TONSILLECTOMY     TRANSURETHRAL RESECTION OF BLADDER TUMOR  2002   UPPER GASTROINTESTINAL ENDOSCOPY     VIDEO BRONCHOSCOPY  04/11/2022   Procedure: VIDEO BRONCHOSCOPY;  Surgeon: Josephine Igo, DO;  Location: [facility];  Service: Cardiopulmonary;;   VIDEO BRONCHOSCOPY WITH ENDOBRONCHIAL NAVIGATION N/A 10/13/2019   Procedure: VIDEO BRONCHOSCOPY WITH ENDOBRONCHIAL NAVIGATION;  Surgeon: Josephine Igo, DO;  [facility]: [facility];  Service: Pulmonary;  Laterality: N/A;   WISDOM TOOTH EXTRACTION     XI ROBOTIC ASSISTED LOWER ANTERIOR RESECTION N/A 12/14/2021   Procedure: XI ROBOTIC ASSISTED LOWER ANTERIOR ULTRA LOW RECTOSIGMOID RESECTION, COLOANAL HAND SEWN ANASTOMOSIS, AND BILATERAL TAP BLOCK;  Surgeon: Karie Soda, MD;  Location: [facility];  Service: General;  Laterality: N/A;   Patient Active Problem List   Diagnosis Date Noted   Acute ischemic stroke, likely embolic  07/02/2022   Esophageal dysphagia 06/18/2022   Esophageal stricture 06/18/2022   Hiatal hernia 06/18/2022   Radiation-induced esophagitis 06/18/2022   Pancytopenia due to chemotherapy 06/07/2022   Protein-calorie malnutrition, severe 06/07/2022   Severe sepsis due to Klebsiella pneumonia and bacteremia 06/05/2022   AKI (acute kidney injury) (HCC) 06/05/2022   Hyponatremia 06/05/2022   Acute metabolic encephalopathy 06/05/2022   Acute respiratory failure with hypoxia (HCC) 06/05/2022   Acute on chronic diarrhea 06/05/2022   Weight loss 05/28/2022   Malignant neoplasm of lung (HCC) 04/30/2022   Adenopathy 04/06/2022   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   Encounter for antineoplastic chemotherapy 01/28/2020   Adenocarcinoma of left lung, stage 2 (HCC) 12/31/2019   Goals of care, counseling/discussion 12/31/2019   S/P lobectomy of lung 12/16/2019   Lung nodule 10/13/2019   Essential hypertension 09/28/2016   Coronary artery calcification 09/28/2016   Impaired glucose tolerance 09/28/2016   Vitamin D deficiency 06/03/2007  DIVERTICULOSIS, COLON 01/14/2007   Dyslipidemia 01/09/2007   GERD 01/09/2007   REFLUX ESOPHAGITIS 06/01/2004   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    PCP: [name], MD   REFERRING PROVIDER: Karie Soda, MD   REFERRING DIAG: R15.2 (ICD-10-CM) - Rectal urgency   THERAPY DIAG:  Muscle weakness (generalized)  Other muscle spasm  Unspecified lack of coordination  Abnormal posture  Rationale for Evaluation and Treatment: Rehabilitation  ONSET DATE: 03-2022 ostomy reversal and worse since that time  SUBJECTIVE:                                                                                                                                                                                           SUBJECTIVE STATEMENT: Pt states exercises feel good cannot do some days.  Having 1-2 good days / week and that is when  I can catch up on sleep  Fluid intake: 6x 12 oz bottles water.  Pt has feeding tube in place and takes pint with each feeding via the feeding tube, drinking milk  PAIN:  Are you having pain? Yes NPRS scale: 5ish/10 Pain location: Anal  Pain type: burning Pain description: intermittent   Aggravating factors: bowel movements Relieving factors: nothing a little better with the butt paste  PRECAUTIONS: None  WEIGHT BEARING RESTRICTIONS: No  FALLS:  Has patient fallen in last 6 months? No  LIVING ENVIRONMENT: Lives with: lives with their spouse Lives in: House/apartment   OCCUPATION: no  PLOF: Independent  PATIENT GOALS: be able to work in the yard, want to play golf this summer  PERTINENT HISTORY:  lung cancer, bladder cancer, GIST, HLD, HTN, CVA, CAD, GERD, OA    BOWEL MOVEMENT: Pain with bowel movement: Yes Type of bowel movement:Type (Bristol Stool Scale) normal, Frequency 30 min or more, and Strain Yes sometimes Fully empty rectum: No Leakage: Yes: on the way to the bathroom Pads: Yes: diapers 4-5/day Fiber supplement: Yes:    URINATION: Pain with urination: No Fully empty bladder: Yes:    Leakage:  no Pads: No  INTERCOURSE: Not currently   OBJECTIVE:     PATIENT SURVEYS:    PFIQ-7   COGNITION: Overall cognitive status: Within functional limits for tasks assessed     SENSATION: Light touch: Appears intact Proprioception:   MUSCLE LENGTH: Hamstrings:   LUMBAR SPECIAL TESTS:    FUNCTIONAL TESTS:    GAIT: slow cadence, flexed posture   POSTURE: rounded shoulders, forward head, and increased thoracic kyphosis  PELVIC ALIGNMENT:  LUMBARAROM/PROM:  A/PROM A/PROM  eval  Flexion   Extension   Right lateral flexion   Left lateral flexion   Right  rotation   Left rotation    (Blank rows = not tested)  LOWER EXTREMITY AROM/PROM:  A/PROM Right eval Left eval  Hip flexion    Hip extension    Hip abduction    Hip adduction     Hip internal rotation    Hip external rotation    Knee flexion    Knee extension    Ankle dorsiflexion    Ankle plantarflexion    Ankle inversion    Ankle eversion     (Blank rows = not tested)  LOWER EXTREMITY MMT:  MMT Right eval Left eval  Hip flexion    Hip extension    Hip abduction    Hip adduction    Hip internal rotation    Hip external rotation    Knee flexion    Knee extension    Ankle dorsiflexion    Ankle plantarflexion    Ankle inversion    Ankle eversion     PALPATION: GENERAL generally low muscle tone              External Perineal Exam anal wink reflex present              Internal Pelvic Floor tight and dyssynergic bulging Patient confirms identification and approves PT to assess internal pelvic floor and treatment Yes - exam to posterior pelvic floor rectally  PELVIC MMT:   MMT eval  Internal Anal Sphincter 2/5  External Anal Sphincter 2/5  Puborectalis 2/5  Diastasis Recti   (Blank rows = not tested)  TONE: high  TODAY'S TREATMENT:                                                                                                                              DATE: 09/13/22   Exercise: Prone hip ext - 10x Single arm reaches prone 10x Dead lift modified with cane progression to no cues - 3 x 10 Mini squat - 3 x 10 (touching the mat)        PATIENT EDUCATION:  Education details:  Access Code: [email or web address] Person educated: Patient and Spouse Education method: Explanation, Demonstration, Tactile cues, Verbal cues, and Handouts Education comprehension: verbalized understanding and needs further education  HOME EXERCISE PROGRAM: Access Code: [email or web address] URL: [email or web address] Date: 08/15/2022 Prepared by: Dwana Curd  Exercises - Supine Hip Internal and External Rotation  - 1 x daily - 7 x weekly - 1 sets - 10 reps - 5 sec hold - Supine Piriformis Stretch with Leg Straight  - 1 x daily - 7 x weekly - 1 sets - 3  reps - 30 sec hold - Supine Figure 4 Piriformis Stretch  - 1 x daily - 7 x weekly - 1 sets - 3 reps - 30 sec hold - Supine Double Knee to Chest  - 1 x daily - 7 x weekly - 1 sets - 3 reps - 30 hold - Modified Cat Cow on Counter  - 1 x  daily - 7 x weekly - 3 sets - 10 reps - Standing Hip Circles  - 1 x daily - 7 x weekly - 3 sets - 10 reps  ASSESSMENT:  CLINICAL IMPRESSION: Pt reports yesterday was good but slept most of the day.  Today focused on continued strength for improved gluteal strength.  Pt able to correctly do hip hinging with cues and the progress to be abel to do these ind.  Pt was given updates to HEP to include functional strength.  Pt will continue to benefit from skilled Pt to address muskuloskeletal impairments for reduction in difficulty with bowel movements.   OBJECTIVE IMPAIRMENTS: decreased activity tolerance, decreased coordination, decreased endurance, decreased ROM, decreased strength, increased muscle spasms, impaired flexibility, impaired tone, postural dysfunction, and pain.   ACTIVITY LIMITATIONS: sitting, sleeping, continence, and toileting  PARTICIPATION LIMITATIONS: interpersonal relationship, community activity, and yard work  PERSONAL FACTORS: 3+ comorbidities: lung cancer, bladder cancer, GIST, HLD, HTN, CVA, CAD, GERD, OA , ileostomy reversal, feeding tube  are also affecting patient's functional outcome.   REHAB POTENTIAL: Good  CLINICAL DECISION MAKING: Evolving/moderate complexity  EVALUATION COMPLEXITY: Moderate   GOALS: Goals reviewed with patient? Yes  SHORT TERM GOALS: Target date: 09/04/22 Updated 08/21/22  Pt will be ind with toileting techniques Baseline: Goal status: MET  2.  Pt will report 25% less anal pain due to improved skin hygiene and more complete bowel movements Baseline:  Goal status: NOT MET  3.  Pt will be ind with initial HEP Baseline:  Goal status: MET    LONG TERM GOALS: Target date: 10/30/22 Updated  09/04/22  Pt will be independent with advanced HEP to maintain improvements made throughout therapy  Baseline:  Goal status: IN PROGRESS  2.  Pt will report 80% reduction of pain due to improvements in posture, strength, and muscle length  Baseline:  Goal status: IN PROGRESS  3.  Pt will be able to functional actions such as walking to the bathroom without leakage at least 80% of the time for reduced use of pads/diapers Baseline: 5 or more diapers per day Goal status: IN PROGRESS  4.  Pt will be able to reduce number of bowel movements per day to 3-5 due to more complete emptying Baseline: 60/day Goal status: IN PROGRESS  5.  Pt will be able to sleep at least 4 consecutive hours with waking 1-2x at most to use the bathroom Baseline:  Goal status: IN PROGRESS     PLAN:  PT FREQUENCY: 1-2x/week  PT DURATION: 12 weeks  PLANNED INTERVENTIONS: Therapeutic exercises, Therapeutic activity, Neuromuscular re-education, Balance training, Gait training, Patient/Family education, Self Care, Joint mobilization, Dry Needling, Electrical stimulation, Cryotherapy, Moist heat, Taping, Traction, Biofeedback, Manual therapy, and Re-evaluation  PLAN FOR NEXT SESSION:   hip hinging and squats for functional strength, vibration plate stretches   Brayton Caves Avryl Roehm, PT 09/13/2022, 3:26 PM   PHYSICAL THERAPY DISCHARGE SUMMARY  Visits from Start of Care: 6  Current functional level related to goals / functional outcomes: See above for most current PT status.  Pt didn't return to PT.     Remaining deficits: See above for current status.    Education / Equipment: HEP   Patient agrees to discharge. Patient goals were partially met. Patient is being discharged due to not returning since the last visit.  Lorrene Reid, PT 05/19/23 1:46 PM

## 2022-09-17 ENCOUNTER — Ambulatory Visit: Payer: Medicare Other | Admitting: Nutrition

## 2022-09-17 ENCOUNTER — Encounter: Payer: Self-pay | Admitting: Nurse Practitioner

## 2022-09-17 ENCOUNTER — Other Ambulatory Visit: Payer: Self-pay

## 2022-09-17 ENCOUNTER — Encounter: Payer: Medicare Other | Admitting: Physical Therapy

## 2022-09-17 ENCOUNTER — Inpatient Hospital Stay: Payer: Medicare Other | Attending: Internal Medicine | Admitting: Nurse Practitioner

## 2022-09-17 VITALS — BP 113/68 | HR 113 | Temp 98.5°F | Resp 18 | Wt 144.2 lb

## 2022-09-17 DIAGNOSIS — R530 Neoplastic (malignant) related fatigue: Secondary | ICD-10-CM | POA: Diagnosis not present

## 2022-09-17 DIAGNOSIS — R197 Diarrhea, unspecified: Secondary | ICD-10-CM | POA: Diagnosis not present

## 2022-09-17 DIAGNOSIS — Z515 Encounter for palliative care: Secondary | ICD-10-CM

## 2022-09-17 DIAGNOSIS — C49A5 Gastrointestinal stromal tumor of rectum: Secondary | ICD-10-CM

## 2022-09-17 DIAGNOSIS — R630 Anorexia: Secondary | ICD-10-CM | POA: Diagnosis not present

## 2022-09-17 MED ORDER — DIPHENOXYLATE-ATROPINE 2.5-0.025 MG PO TABS
2.0000 | ORAL_TABLET | Freq: Three times a day (TID) | ORAL | 0 refills | Status: DC | PRN
Start: 2022-09-17 — End: 2022-09-27

## 2022-09-17 NOTE — Progress Notes (Signed)
Palliative Medicine [facility]  [phone] [phone]   Name: Jesse [name] Date: 09/17/2022 MRN: [id number]  DOB: [birth year]  Patient Care Team: [name], MD as PCP - General (Family Medicine) Si Gaul, MD as Consulting Physician (Oncology) Verner Chol, [facility] (Inactive) as Pharmacist (Pharmacist) Iva Boop, MD as Consulting Physician (Gastroenterology) Josephine Igo, DO as Consulting Physician (Pulmonary Disease) Berneice Heinrich Delbert Phenix., MD as Consulting Physician (Urology) Karie Soda, MD as Consulting Physician (General Surgery)    INTERVAL HISTORY: Jesse [name] is a 72 y.o. 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 Previous medical history also includes hypertension, hyperlipidemia, GERD, anemia, COPD, and macular degeneration and a stroke during most recent hospitalization. Surgical history includes ileostomy and takedown. Peg tube placed (07/04/2022) for esophageal stricture/dysphagia. Palliative asked to see for symptom management and goals of care.   SOCIAL HISTORY:     reports that he quit smoking about 2 years ago. His smoking use included cigarettes. He has a 20.00 pack-year smoking history. He has never used smokeless tobacco. He reports that he does not currently use alcohol after a past usage of about 15.0 standard drinks of alcohol per week. He reports that he does not currently use drugs after having used the following drugs: Marijuana.  ADVANCE DIRECTIVES:  Advanced directives on file  CODE STATUS: Full code  PAST MEDICAL HISTORY: Past Medical History:  Diagnosis Date   Anemia    Cancer (HCC)    bladder cancer   COPD (chronic obstructive pulmonary disease) (HCC)    mild    DIVERTICULOSIS, COLON 01/14/2007   Qualifier: Diagnosis of  By: Marcelyn Ditty RN,  Katy Fitch    GASTRITIS, CHRONIC 06/01/2004   Qualifier: Diagnosis of  By: Creta Levin CMA (AAMA), Robin     GERD 01/09/2007   Qualifier: Diagnosis of  By: Tawanna Cooler, RN, Ellen     Heart murmur    as a child; no murmur heard 12/14/19   History of radiation therapy    Mediastinum- 05/15/22-06/28/22-Dr. Antony Blackbird   HYPERLIPIDEMIA 01/09/2007   Qualifier: Diagnosis of  By: Everett Graff     Hypertension    Lung mass    left upper nodule   Macular degeneration    right eye   NEOPLASM, MALIGNANT, BLADDER, HX OF 2002   Qualifier: Diagnosis of  By: Creta Levin CMA (AAMA), Robin  / no chemo or radiation   OSTEOARTHRITIS 01/14/2007   Qualifier: Diagnosis of  By: Marcelyn Ditty, RN, Katy Fitch - knees   Rectal mass    Reflux esophagitis 06/01/2004   Qualifier: Diagnosis of  By: Creta Levin CMA (AAMA), Robin     Stroke [facility])    TOBACCO USER 02/16/2009   Qualifier: Diagnosis of  By: Amador Cunas  MD, Janett Labella    VITAMIN D DEFICIENCY 06/03/2007   Qualifier: Diagnosis of  By: Amador Cunas  MD, Janett Labella     ALLERGIES:  has No Known Allergies.  MEDICATIONS:  Current Outpatient Medications  Medication Sig Dispense Refill   acetaminophen (TYLENOL) 500 MG tablet Take 1,000 mg by mouth every 8 (eight) hours as needed for moderate pain.     amLODipine (NORVASC) 5 MG tablet Take 1 tablet (5 mg total) by mouth daily. 90 tablet 3   aspirin 81 MG chewable tablet Chew 1 tablet (81 mg total) by mouth daily.  atorvastatin (LIPITOR) 40 MG tablet Take 1/2 tablet (20 mg total) by mouth daily. 90 tablet 3   Cholecalciferol (VITAMIN D3 PO) Take 2,000 Units by mouth daily.     cholestyramine (QUESTRAN) 4 GM/DOSE powder Take 4 g by mouth 2 (two) times daily.     diphenoxylate-atropine (LOMOTIL) 2.5-0.025 MG tablet Take 1 tablet by mouth 4 (four) times daily as needed for diarrhea or loose stools. 30 tablet 0   ferrous sulfate 325 (65 FE) MG EC tablet Take 1 tablet (325 mg total) by mouth 2 (two) times daily. 60 tablet 1    imatinib (GLEEVEC) 400 MG tablet Take 1 tablet (400 mg total) by mouth daily. Take with meals and large glass of water.Caution:Chemotherapy. (Patient taking differently: Take 400 mg by mouth at bedtime. Take with meals and large glass of water.Caution:Chemotherapy.) 30 tablet 3   lidocaine (XYLOCAINE) 2 % solution Use as directed 15 mLs in the mouth or throat 4 (four) times daily -  before meals and at bedtime. 420 mL 1   lidocaine (XYLOCAINE) 5 % ointment Apply 1 Application topically 3 (three) times daily as needed for mild pain or moderate pain. 35.44 g 3   loperamide (IMODIUM A-D) 2 MG tablet Take 4 mg by mouth 4 (four) times daily as needed for diarrhea or loose stools.     mirtazapine (REMERON) 15 MG tablet Take 1 tablet (15 mg total) by mouth at bedtime. 30 tablet 2   Multiple Vitamins-Minerals (PRESERVISION AREDS 2) CAPS Take 1 capsule by mouth 2 (two) times daily.     Nutritional Supplements (FEEDING SUPPLEMENT, JEVITY 1.5 CAL/FIBER,) LIQD Change TF to 1 carton Osmolite 1.5 + 1 carton Jevity 1.5 with 60 mL free water before and after feedings TID via PEG. 711 mL ML   Nutritional Supplements (FEEDING SUPPLEMENT, OSMOLITE 1.5 CAL,) LIQD Change TF to 1 carton Osmolite 1.5 + 1 carton Jevity 1.5 with 60 mL free water before and after feedings TID via PEG. 711 mL 12   pantoprazole (PROTONIX) 40 MG tablet Take 1 tablet (40 mg total) by mouth 2 (two) times daily. 60 tablet 3   sucralfate (CARAFATE) 1 g tablet Take 1 tablet by mouth 4 times daily -  with meals and at bedtime. 120 tablet 11   No current facility-administered medications for this visit.    VITAL SIGNS: There were no vitals taken for this visit. There were no vitals filed for this visit.  Estimated body mass index is 20.37 kg/m as calculated from the following:   Height as of 09/12/22: 5\' 10"  (1.778 m).   Weight as of 09/12/22: 142 lb (64.4 kg).   PERFORMANCE STATUS (ECOG) : 1 - Symptomatic but completely ambulatory   Physical  Exam General: NAD, some discomfort sitting, has donut cushion  Cardiovascular: Tachycardic (113) Pulmonary: normal breathing pattern  Abdomen: soft, nontender, + bowel sounds, PEG in place Extremities: no edema, no joint deformities Skin: no rashes Neurological: AAO x4  IMPRESSION: Jesse [name] and his wife present to clinic for ongoing support. No acute distress. Continues to take things one day at a time. Tolerating tube feedings however ongoing concerns with loose stools and leakage. He speaks to his decreased quality of life. Emotional expressing this is not him. Emotional support provided.   1.  Diarrhea/Dyspepsia  Jesse [name] is taking Lomotil daily for loose stools. Some days are better than others. Is concerned if this is related to tube feedings versus overall gastric condition. Bowel movements are interfering with his rest  pattern. Increased fatigue.   Weight is slowly increasing. 144lbs up from 142 lbs on 5/29, 133lbs 4/9. Is meeting with Dietician later today. Also has a scheduled visit with GI on this coming Wednesday. We discussed potential use of octreotide in the event his diarrhea continues and is agreed upon per GI.  Goals of care   4/9- Jesse [name] wishes to be a FULL CODE and pursue treatments that can improve his level of health and functioning. He has HCPOA listing his wife as Management consultant, and Living Will documents on file. Patient and wife interested in MOST form to help consolidate wishes onto one form that could be honored by medical providers/EMS in the event of an emergency. They are taking form home for review and to decide if they would like to complete at next visit.  We discussed Her current illness and what it means in the larger context of Her on-going co-morbidities. Natural disease trajectory and expectations were discussed.  I discussed the importance of continued conversation with family and their medical providers regarding overall plan of care and treatment options, ensuring decisions are within the context of the patients values and GOCs.  PLAN:   Compazine 10 mg PO/per tube every 6 hours as needed for nausea. Continue nightly Remeron. Monitor sleep pattern in setting of changes to  diet, bowel pattern, and adjustment to home environment. Continue Tylenol 1,000 mg every 8 hours as needed for pain. Will monitor pain closely. Patient and wife aware to call for changes/needs. Lidocaine ointment to sacral area as needed Carafate twice daily Lomotil as needed up to four times daily. Education provided on safe use and prevention of constipation.  Protonix twice daily  Palliative will plan to see patient back in 2-4 weeks in collaboration to other oncology appointments.   Patient expressed understanding and was in agreement with this plan. He also understands that He can call the clinic at any time with any questions, concerns, or complaints.    Visit consisted of counseling and education dealing with the complex and emotionally intense issues of symptom management and palliative care in the setting of serious and potentially life-threatening illness.Greater than 50%  of this time was spent counseling and coordinating care related to the above assessment and plan.  Willette Alma, AGPCNP-BC  Palliative Medicine Team/[facility]  *Please note that this is a verbal dictation therefore any spelling or grammatical errors are due to the "Dragon Medical One" system interpretation.

## 2022-09-17 NOTE — Progress Notes (Signed)
Nutrition follow-up completed with patient and wife.  Weight documented as 144 pounds 3.2 ounces increased from 141 pounds 2 ounces on May 1.  Estimated nutrition needs: 2000-2300 cal, 85-100 g protein, greater than 2 L fluid.  Patient denies nausea and vomiting.  He states he has a "sour stomach" late in the day.  He has been taking probiotics for 1 month but feels no difference.  He continues to drink 1 bottle Enterade daily.  States he always eats breakfast.  He sometimes eats lunch but by dinnertime he is unable to eat.  He feels discouraged because he continues to have very little energy and cannot be more active.  He continues to have diarrhea/multiple stools every day.  States it is a very small amount but he can barely leave the house.  States physical therapy has not seemed to help with incontinence.  Patient has an appointment with his GI doctor on Wednesday this week.  Continues to use 3 cartons of Osmolite 1.5+3 cartons of Jevity 1.5 with 60 mL free water before and after bolus feedings 3 times daily.    This regimen provides 2130 cal, 90 g protein, 1443 mL free water.  Remaining free water needs consumed by mouth.  Nutrition diagnosis: Inadequate oral intake continues.  Intervention: Continue present tube feeding for now and await GI recommendations. I do not recommend changing tube feeding regimen today.  He has tried various other regimens with no improvement.  Current tube feeding providing 100% estimated calories and protein.  Monitoring, evaluation, goals: Patient will work to tolerate adequate calories and protein to minimize weight loss and improve diarrhea.  Next visit: Telephone follow-up after GI doctor visit to determine next steps.  **Disclaimer: This note was dictated with voice recognition software. Similar sounding words can inadvertently be transcribed and this note may contain transcription errors which may not have been corrected upon publication of note.**

## 2022-09-19 ENCOUNTER — Encounter: Payer: Self-pay | Admitting: Internal Medicine

## 2022-09-19 ENCOUNTER — Ambulatory Visit: Payer: Medicare Other | Admitting: Internal Medicine

## 2022-09-19 VITALS — BP 94/60 | HR 104 | Ht 69.0 in | Wt 141.5 lb

## 2022-09-19 DIAGNOSIS — R159 Full incontinence of feces: Secondary | ICD-10-CM | POA: Diagnosis not present

## 2022-09-19 DIAGNOSIS — C3492 Malignant neoplasm of unspecified part of left bronchus or lung: Secondary | ICD-10-CM | POA: Diagnosis not present

## 2022-09-19 DIAGNOSIS — C49A5 Gastrointestinal stromal tumor of rectum: Secondary | ICD-10-CM | POA: Diagnosis not present

## 2022-09-19 DIAGNOSIS — R634 Abnormal weight loss: Secondary | ICD-10-CM

## 2022-09-19 DIAGNOSIS — E43 Unspecified severe protein-calorie malnutrition: Secondary | ICD-10-CM | POA: Diagnosis not present

## 2022-09-19 DIAGNOSIS — K529 Noninfective gastroenteritis and colitis, unspecified: Secondary | ICD-10-CM | POA: Diagnosis not present

## 2022-09-19 MED ORDER — MIRTAZAPINE 30 MG PO TBDP: 30.0000 mg | ORAL_TABLET | Freq: Every day | ORAL | 1 refills | Status: DC

## 2022-09-19 NOTE — Patient Instructions (Addendum)
You have been scheduled for a colonoscopy. Please follow written instructions given to you at your visit today.  Please pick up your prep supplies at the pharmacy within the next 1-3 days. If you use inhalers (even only as needed), please bring them with you on the day of your procedure.  We have sent the following medications to your pharmacy for you to pick up at your convenience: Mirtazapine   _______________________________________________________  If your blood pressure at your visit was 140/90 or greater, please contact your primary care physician to follow up on this.  _______________________________________________________  If you are age 73 or older, your body mass index should be between 23-30. Your Body mass index is 20.9 kg/m. If this is out of the aforementioned range listed, please consider follow up with your Primary Care Provider.  If you are age 47 or younger, your body mass index should be between 19-25. Your Body mass index is 20.9 kg/m. If this is out of the aformentioned range listed, please consider follow up with your Primary Care Provider.   ________________________________________________________  The [place] [facility] providers would like to encourage you to use [facility] to communicate with providers for non-urgent requests or questions.  Due to long hold times on the telephone, sending your provider a message by [facility] may be a faster and more efficient way to get a response.  Please allow 48 business hours for a response.  Please remember that this is for non-urgent requests.  _______________________________________________________   I appreciate the opportunity to care for you. Stan Head, MD, [facility]

## 2022-09-19 NOTE — Progress Notes (Unsigned)
Jesse [name] 72 y.o. 1950/08/28 [id number]  Assessment & Plan:   Encounter Diagnoses  Name Primary?   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  Diarrhea is probably multifactorial, and I suspect there is a component if not a significant component of rectal dysfunction as part of his problems.  Palliative care has raised the possibility of using Sandostatin.  I am not sure that is correct, though it could potentially help, I doubt we can get it covered.  Before we go to that he needs an investigation of his rectum and colon with colonoscopy to determine if there are other issues in play.  I also wonder if Gleevec could be contributing.  He is supposed to finish that later this year.  We could also consider a change in his tube nutrition to something different perhaps.  Fortunately he is eating better.  I am going to increase his mirtazapine from 15 to 30 mg.  Further plans pending the colonoscopy.  Currently using Lomotil 3 times daily we could increase that to 4 times daily.  His gastrostomy tube is intact and looks fine.  The erythema at the stoma is normal.  He is undergoing pelvic floor physical therapy but reports he is too weak to really comply and adhere to recommendations.  He will continue with this.  I think it is a good idea but he needs to continue to improve he is still significantly malnourished though improved.  Overall I think he is still quite frail but again better than before when he was hospitalized with a pneumonia and other issues. Meds ordered this encounter  Medications   mirtazapine (REMERON SOL-TAB) 30 MG  disintegrating tablet    Sig: Take 1 tablet (30 mg total) by mouth at bedtime.    Dispense:  90 tablet    Refill:  1    Subjective:   Chief Complaint: Diarrhea and fecal incontinence  HPI 72 year old white man with a history of a rectal GIST resected by Dr. Michaell Cowing with a transient diverting ileostomy and then reanastomosis earlier this year.  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 Shirline Frees.  He takes Gleevec for his GIST.  He was in the hospital earlier this year with failure to thrive and inability to eat, I put a gastrostomy tube in, he did not have any abnormality in the esophagus to cause dysphagia.  He was started on mirtazapine.  He has been on tube nutrition since then.  He is having what he says are up to 60 small bowel movements a day most of which are uncontrollable.  He is on cholestyramine 4 g twice daily Lomotil to 3 times daily.  These medications helped some but he is still having significant diarrhea and bowel issues.  Stools are soft or loose or watery.  He is weak but improving some.  He is clearly better than when I saw him in the hospital in the spring.  Other issues are a short-term memory loss with 3 strokes.  He saw palliative care recently and  they recommended he consider Sandostatin.  He reports that Dr. Shirline Frees had discontinued his Gleevec but Dr. Michaell Cowing continued it.  He recently stopped FiberCon as he felt that it was not helpful.  He continues to not sleep very well though he has helped some by THC Gummies and the mirtazapine.  He is using a substance called Enterade which is a nutritional support or fluid replacement reportedly for IBS-D patients. W/ wife Wt Readings from Last 3 Encounters:  09/19/22 141 lb 8 oz (64.2 kg)  09/17/22 144 lb 3.2 oz (65.4 kg)  09/12/22 142 lb (64.4 kg)  CT abdomen pelvis with contrast IMPRESSION: 1. Mild hydroureteronephrosis  on the right with mild pyelo sinus extravasation in the peri renal fat. No stone is seen within the right ureter. There is a 3 mm stone dependent within the bladder, presumably recently passed from the right ureter. 2. Question single 2 mm stone dependent in the gallbladder. No CT evidence of gallbladder inflammation. 3. Aortic atherosclerosis. 4. Improved patchy opacities at the lung bases consistent with treated pneumonia. No worsening or new finding. 5. Moderate amount of fecal matter within the colon. Previous colon anastomotic surgery. No acute colon finding. 6. Prominent prostate. 7. Scoliosis and chronic degenerative change of the spine. 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   Aortic Atherosclerosis (ICD10-I70.0).     Electronically Signed   By: Paulina Fusi M.D.   On: 08/06/2022 15:37     Latest Ref Rng & Units 08/06/2022    1:04 PM 07/24/2022   12:54 PM 07/04/2022    6:02 AM  CBC  WBC 4.0 - 10.5 K/uL 4.6  6.9  6.1   Hemoglobin 13.0 - 17.0 g/dL 8.8  8.5  7.9   Hematocrit 39.0 - 52.0 % 28.6  26.7  25.8   Platelets 150 - 400 K/uL 218  270  282      Chemistry      Component Value Date/Time   NA 135 08/06/2022 1304   K 4.2 08/06/2022 1304   CL 99 08/06/2022 1304   CO2 28 08/06/2022 1304   BUN 26 (H) 08/06/2022 1304   CREATININE 0.69 08/06/2022 1304   CREATININE 0.70 07/24/2022 1254      Component Value Date/Time   CALCIUM 9.0 08/06/2022 1304   ALKPHOS 116 07/24/2022 1254   AST 26 07/24/2022 1254   ALT 27 07/24/2022 1254   BILITOT 0.3 07/24/2022 1254     Lab Results  Component Value Date   TSH 0.673 06/05/2022    No Known Allergies Current Meds  Medication Sig   acetaminophen (TYLENOL) 500 MG tablet Take 1,000 mg by mouth every 8 (eight) hours as needed for moderate pain.   amLODipine (NORVASC) 5 MG tablet Take 1 tablet (5 mg total) by mouth daily.   aspirin 81 MG chewable tablet Chew 1 tablet (81 mg total) by mouth daily.   atorvastatin  (LIPITOR) 40 MG tablet Take 1/2 tablet (20 mg total) by mouth daily.   Cholecalciferol (VITAMIN D3 PO) Take 2,000 Units by mouth daily.   cholestyramine (QUESTRAN) 4 GM/DOSE powder Take 4 g by mouth 2 (two) times daily.   diphenoxylate-atropine (LOMOTIL) 2.5-0.025 MG tablet Take 2 tablets by mouth 3 (three) times daily as needed for diarrhea or loose stools.   ferrous sulfate 325 (65 FE) MG EC tablet Take 1 tablet (325 mg total) by mouth 2 (two) times daily.   imatinib (GLEEVEC) 400 MG tablet Take 1 tablet (400 mg total) by  mouth daily. Take with meals and large glass of water.Caution:Chemotherapy. (Patient taking differently: Take 400 mg by mouth at bedtime. Take with meals and large glass of water.Caution:Chemotherapy.)   lidocaine (XYLOCAINE) 2 % solution Use as directed 15 mLs in the mouth or throat 4 (four) times daily -  before meals and at bedtime.   lidocaine (XYLOCAINE) 5 % ointment Apply 1 Application topically 3 (three) times daily as needed for mild pain or moderate pain.   loperamide (IMODIUM A-D) 2 MG tablet Take 4 mg by mouth 4 (four) times daily as needed for diarrhea or loose stools.   mirtazapine (REMERON SOL-TAB) 30 MG disintegrating tablet Take 1 tablet (30 mg total) by mouth at bedtime.   Multiple Vitamins-Minerals (PRESERVISION AREDS 2) CAPS Take 1 capsule by mouth 2 (two) times daily.   Nutritional Supplements (FEEDING SUPPLEMENT, JEVITY 1.5 CAL/FIBER,) LIQD Change TF to 1 carton Osmolite 1.5 + 1 carton Jevity 1.5 with 60 mL free water before and after feedings TID via PEG.   Nutritional Supplements (FEEDING SUPPLEMENT, OSMOLITE 1.5 CAL,) LIQD Change TF to 1 carton Osmolite 1.5 + 1 carton Jevity 1.5 with 60 mL free water before and after feedings TID via PEG.   pantoprazole (PROTONIX) 40 MG tablet Take 1 tablet (40 mg total) by mouth 2 (two) times daily.   sucralfate (CARAFATE) 1 g tablet Take 1 tablet by mouth 4 times daily -  with meals and at bedtime. (Patient taking  differently: Take 1 g by mouth 2 (two) times daily.)   [DISCONTINUED] mirtazapine (REMERON) 15 MG tablet Take 1 tablet (15 mg total) by mouth at bedtime.   Past Medical History:  Diagnosis Date   Anemia    Cancer (HCC)    bladder cancer   COPD (chronic obstructive pulmonary disease) (HCC)    mild    DIVERTICULOSIS, COLON 01/14/2007   Qualifier: Diagnosis of  By: Marcelyn Ditty RN, Katy Fitch    GASTRITIS, CHRONIC 06/01/2004   Qualifier: Diagnosis of  By: Creta Levin CMA (AAMA), Robin     GERD 01/09/2007   Qualifier: Diagnosis of  By: Tawanna Cooler, RN, Ellen     Heart murmur    as a child; no murmur heard 12/14/19   History of radiation therapy    Mediastinum- 05/15/22-06/28/22-Dr. Antony Blackbird   HYPERLIPIDEMIA 01/09/2007   Qualifier: Diagnosis of  By: Everett Graff     Hypertension    Lung mass    left upper nodule   Macular degeneration    right eye   NEOPLASM, MALIGNANT, BLADDER, HX OF 2002   Qualifier: Diagnosis of  By: Creta Levin CMA (AAMA), Robin  / no chemo or radiation   OSTEOARTHRITIS 01/14/2007   Qualifier: Diagnosis of  By: Marcelyn Ditty, RN, Katy Fitch - knees   Rectal mass    Reflux esophagitis 06/01/2004   Qualifier: Diagnosis of  By: Creta Levin CMA (AAMA), Robin     Stroke [facility])    TOBACCO USER 02/16/2009   Qualifier: Diagnosis of  By: Amador Cunas  MD, Janett Labella    VITAMIN D DEFICIENCY 06/03/2007   Qualifier: Diagnosis of  By: Amador Cunas  MD, Janett Labella    Past Surgical History:  Procedure Laterality Date   BIOPSY  06/18/2022   Procedure: BIOPSY;  Surgeon: Shellia Cleverly, DO;  Location: [facility];  Service: Gastroenterology;;   BREAST BIOPSY Left 03/16/2022   [place] LT RADIOACTIVE SEED LOC 03/16/2022 [facility]   BRONCHIAL BIOPSY  10/13/2019   Procedure: BRONCHIAL BIOPSIES;  Surgeon: Josephine Igo,  DO;  Location: [facility];  Service: Pulmonary;;   BRONCHIAL BRUSHINGS  10/13/2019   Procedure: BRONCHIAL BRUSHINGS;  Surgeon: Josephine Igo, DO;  Location: [facility];  Service:  Pulmonary;;   BRONCHIAL NEEDLE ASPIRATION BIOPSY  10/13/2019   Procedure: BRONCHIAL NEEDLE ASPIRATION BIOPSIES;  Surgeon: Josephine Igo, DO;  Location: [facility];  Service: Pulmonary;;   BRONCHIAL NEEDLE ASPIRATION BIOPSY  04/11/2022   Procedure: BRONCHIAL NEEDLE ASPIRATION BIOPSIES;  Surgeon: Josephine Igo, DO;  Location: [facility];  Service: Cardiopulmonary;;   BRONCHIAL WASHINGS  10/13/2019   Procedure: BRONCHIAL WASHINGS;  Surgeon: Josephine Igo, DO;  Location: [facility];  Service: Pulmonary;;   COLONOSCOPY     multiple   DIVERTING ILEOSTOMY N/A 12/14/2021   Procedure: DIVERTING ILEOSTOMY;  Surgeon: Karie Soda, MD;  Location: [facility];  Service: General;  Laterality: N/A;   ENDOBRONCHIAL ULTRASOUND Bilateral 04/11/2022   Procedure: ENDOBRONCHIAL ULTRASOUND;  Surgeon: Josephine Igo, DO;  Location: [facility];  Service: Cardiopulmonary;  Laterality: Bilateral;   ESOPHAGEAL DILATION  06/18/2022   Procedure: ESOPHAGEAL DILATION;  Surgeon: Shellia Cleverly, DO;  Location: [facility];  Service: Gastroenterology;;   ESOPHAGOGASTRODUODENOSCOPY (EGD) WITH PROPOFOL N/A 06/18/2022   Procedure: ESOPHAGOGASTRODUODENOSCOPY (EGD) WITH PROPOFOL;  Surgeon: Shellia Cleverly, DO;  Location: [facility];  Service: Gastroenterology;  Laterality: N/A;  Severe dysphagia/odynophagia ?radiation esophagitis versus oral candidiasis   ILEOSTOMY CLOSURE N/A 03/22/2022   Procedure: OPEN TAKEDOWN OF LOOP ILEOSTOMY;  Surgeon: Karie Soda, MD;  Location: [facility];  Service: General;  Laterality: N/A;  GEN w/ERAS PATHWAY LOCAL   INTERCOSTAL NERVE BLOCK Left 12/16/2019   Procedure: INTERCOSTAL NERVE BLOCK;  Surgeon: Corliss Skains, MD;  Location: [facility];  Service: Thoracic;  Laterality: Left;   IR FLUORO RM 30-60 MIN  07/02/2022   KNEE SURGERY  07/20/2008   bilat.   NASAL SEPTUM SURGERY  1995   NODE DISSECTION Left 12/16/2019   Procedure: NODE DISSECTION;  Surgeon: Corliss Skains, MD;   Location: [facility];  Service: Thoracic;  Laterality: Left;   PEG PLACEMENT N/A 07/04/2022   Procedure: PERCUTANEOUS ENDOSCOPIC GASTROSTOMY (PEG) PLACEMENT;  Surgeon: Iva Boop, MD;  Location: [facility];  Service: Gastroenterology;  Laterality: N/A;   RADIOACTIVE SEED GUIDED AXILLARY SENTINEL LYMPH NODE Left 03/20/2022   Procedure: RADIOACTIVE SEED GUIDED LEFT AXILLARY SENTINEL LYMPH NODE BIOPSY;  Surgeon: Harriette Bouillon, MD;  Location: [facility];  Service: General;  Laterality: Left;   RECTAL EXAM UNDER ANESTHESIA N/A 03/22/2022   Procedure: ANORECTAL EXAMINATION UNDER ANESTHESIA;  Surgeon: Karie Soda, MD;  Location: [facility];  Service: General;  Laterality: N/A;   TONSILLECTOMY     TRANSURETHRAL RESECTION OF BLADDER TUMOR  2002   UPPER GASTROINTESTINAL ENDOSCOPY     VIDEO BRONCHOSCOPY  04/11/2022   Procedure: VIDEO BRONCHOSCOPY;  Surgeon: Josephine Igo, DO;  Location: [facility];  Service: Cardiopulmonary;;   VIDEO BRONCHOSCOPY WITH ENDOBRONCHIAL NAVIGATION N/A 10/13/2019   Procedure: VIDEO BRONCHOSCOPY WITH ENDOBRONCHIAL NAVIGATION;  Surgeon: Josephine Igo, DO;  [facility];  Service: Pulmonary;  Laterality: N/A;   WISDOM TOOTH EXTRACTION     XI ROBOTIC ASSISTED LOWER ANTERIOR RESECTION N/A 12/14/2021   Procedure: XI ROBOTIC ASSISTED LOWER ANTERIOR ULTRA LOW RECTOSIGMOID RESECTION, COLOANAL HAND SEWN ANASTOMOSIS, AND BILATERAL TAP BLOCK;  Surgeon: Karie Soda, MD;  Location: [facility];  Service: General;  Laterality: N/A;   Social History   Social History Narrative   Married   3 children: 3 grandchildren; 3  great grandchildren   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   Review of Systems   Objective:   Physical Exam BP 94/60 (BP Location: Left Arm, Patient Position: Sitting, Cuff Size:  Normal)   Pulse (!) 104   Ht 5\' 9"  (1.753 m)   Wt 141 lb 8 oz (64.2 kg)   BMI 20.90 kg/m  Thin and chronically ill Lungs CTA Cor  NL Abd thin, gastrostoy LUQ - intact and w/o problems, soft and NT Rectum - perianal erythema, feces on skin, scarred stenotic and tender anus, unable to fullyadmit finger due to pain, somewhat decreased resting tone and limited voluntary tone  Alert and oriented x 3

## 2022-09-20 DIAGNOSIS — R1314 Dysphagia, pharyngoesophageal phase: Secondary | ICD-10-CM | POA: Diagnosis not present

## 2022-09-20 DIAGNOSIS — Z931 Gastrostomy status: Secondary | ICD-10-CM | POA: Diagnosis not present

## 2022-09-20 DIAGNOSIS — G9340 Encephalopathy, unspecified: Secondary | ICD-10-CM | POA: Diagnosis not present

## 2022-09-20 DIAGNOSIS — E43 Unspecified severe protein-calorie malnutrition: Secondary | ICD-10-CM | POA: Diagnosis not present

## 2022-09-24 DIAGNOSIS — N200 Calculus of kidney: Secondary | ICD-10-CM | POA: Diagnosis not present

## 2022-09-27 ENCOUNTER — Other Ambulatory Visit: Payer: Self-pay

## 2022-09-27 DIAGNOSIS — Z515 Encounter for palliative care: Secondary | ICD-10-CM

## 2022-09-27 DIAGNOSIS — C49A5 Gastrointestinal stromal tumor of rectum: Secondary | ICD-10-CM

## 2022-09-27 DIAGNOSIS — R197 Diarrhea, unspecified: Secondary | ICD-10-CM

## 2022-09-27 MED ORDER — DIPHENOXYLATE-ATROPINE 2.5-0.025 MG PO TABS
2.0000 | ORAL_TABLET | Freq: Three times a day (TID) | ORAL | 0 refills | Status: DC | PRN
Start: 2022-09-27 — End: 2022-10-19

## 2022-09-27 NOTE — Telephone Encounter (Signed)
Pt call for refill see new order

## 2022-09-30 ENCOUNTER — Encounter: Payer: Self-pay | Admitting: Certified Registered Nurse Anesthetist

## 2022-10-01 DIAGNOSIS — E43 Unspecified severe protein-calorie malnutrition: Secondary | ICD-10-CM | POA: Diagnosis not present

## 2022-10-01 DIAGNOSIS — Z931 Gastrostomy status: Secondary | ICD-10-CM | POA: Diagnosis not present

## 2022-10-01 DIAGNOSIS — R1314 Dysphagia, pharyngoesophageal phase: Secondary | ICD-10-CM | POA: Diagnosis not present

## 2022-10-01 DIAGNOSIS — G9340 Encephalopathy, unspecified: Secondary | ICD-10-CM | POA: Diagnosis not present

## 2022-10-02 ENCOUNTER — Telehealth: Payer: Self-pay | Admitting: Internal Medicine

## 2022-10-02 NOTE — Telephone Encounter (Signed)
Called patient regarding July appointments, patient is notified. 

## 2022-10-03 ENCOUNTER — Encounter: Payer: Self-pay | Admitting: Internal Medicine

## 2022-10-03 ENCOUNTER — Ambulatory Visit (AMBULATORY_SURGERY_CENTER): Payer: Medicare Other | Admitting: Internal Medicine

## 2022-10-03 VITALS — BP 127/74 | HR 91 | Temp 98.2°F | Resp 24 | Ht 69.0 in | Wt 141.0 lb

## 2022-10-03 DIAGNOSIS — K529 Noninfective gastroenteritis and colitis, unspecified: Secondary | ICD-10-CM

## 2022-10-03 DIAGNOSIS — K624 Stenosis of anus and rectum: Secondary | ICD-10-CM

## 2022-10-03 DIAGNOSIS — R197 Diarrhea, unspecified: Secondary | ICD-10-CM | POA: Diagnosis not present

## 2022-10-03 MED ORDER — SODIUM CHLORIDE 0.9 % IV SOLN
500.0000 mL | INTRAVENOUS | Status: DC
Start: 2022-10-03 — End: 2022-10-03

## 2022-10-03 NOTE — Patient Instructions (Addendum)
Things looked the way they should after the surgery you had. I took biopsies to see if we learn anything from microscopic exam.  Stay tuned - I will let you know.  I appreciate the opportunity to care for you. Iva Boop, MD, FACG   YOU HAD AN ENDOSCOPIC PROCEDURE TODAY AT THE [facility]:   Refer to the procedure report that was given to you for any specific questions about what was found during the examination.  If the procedure report does not answer your questions, please call your gastroenterologist to clarify.  If you requested that your care partner not be given the details of your procedure findings, then the procedure report has been included in a sealed envelope for you to review at your convenience later.  YOU SHOULD EXPECT: Some feelings of bloating in the abdomen. Passage of more gas than usual.  Walking can help get rid of the air that was put into your GI tract during the procedure and reduce the bloating. If you had a lower endoscopy (such as a colonoscopy or flexible sigmoidoscopy) you may notice spotting of blood in your stool or on the toilet paper. If you underwent a bowel prep for your procedure, you may not have a normal bowel movement for a few days.  Please Note:  You might notice some irritation and congestion in your nose or some drainage.  This is from the oxygen used during your procedure.  There is no need for concern and it should clear up in a day or so.  SYMPTOMS TO REPORT IMMEDIATELY:  Following lower endoscopy (colonoscopy or flexible sigmoidoscopy):  Excessive amounts of blood in the stool  Significant tenderness or worsening of abdominal pains  Swelling of the abdomen that is new, acute  Fever of 100F or higher   For urgent or emergent issues, a gastroenterologist can be reached at any hour by calling [phone]. Do not use MyChart messaging for urgent concerns.    DIET:  We do recommend a small meal at first, but then you may  proceed to your regular diet.  Drink plenty of fluids but you should avoid alcoholic beverages for 24 hours.  ACTIVITY:  You should plan to take it easy for the rest of today and you should NOT DRIVE or use heavy machinery until tomorrow (because of the sedation medicines used during the test).    FOLLOW UP: Our staff will call the number listed on your records the next business day following your procedure.  We will call around 7:15- 8:00 am to check on you and address any questions or concerns that you may have regarding the information given to you following your procedure. If we do not reach you, we will leave a message.     If any biopsies were taken you will be contacted by phone or by letter within the next 1-3 weeks.  Please call us at [phone] if you have not heard about the biopsies in 3 weeks.    SIGNATURES/CONFIDENTIALITY: You and/or your care partner have signed paperwork which will be entered into your electronic medical record.  These signatures attest to the fact that that the information above on your After Visit Summary has been reviewed and is understood.  Full responsibility of the confidentiality of this discharge information lies with you and/or your care-partner.

## 2022-10-03 NOTE — Progress Notes (Signed)
Report given to PACU, vss 

## 2022-10-03 NOTE — Op Note (Signed)
[facility] Patient Name: Jesse [name] Procedure Date: 10/03/2022 2:01 PM MRN: [id number] Endoscopist: Iva Boop , MD, [id number] Age: 72 Referring MD:  Date of Birth: 08/20/1950 Gender: Male Account #: [id number] Procedure:                Colonoscopy Indications:              Chronic diarrhea, Clinically significant diarrhea                            of unexplained origin Medicines:                Monitored Anesthesia Care Procedure:                Pre-Anesthesia Assessment:                           - Prior to the procedure, a History and Physical                            was performed, and patient medications and                            allergies were reviewed. The patient's tolerance of                            previous anesthesia was also reviewed. The risks                            and benefits of the procedure and the sedation                            options and risks were discussed with the patient.                            All questions were answered, and informed consent                            was obtained. Prior Anticoagulants: The patient has                            taken no anticoagulant or antiplatelet agents. ASA                            Grade Assessment: III - A patient with severe                            systemic disease. After reviewing the risks and                            benefits, the patient was deemed in satisfactory                            condition to undergo the procedure.  After obtaining informed consent, the colonoscope                            was passed under direct vision. Throughout the                            procedure, the patient's blood pressure, pulse, and                            oxygen saturations were monitored continuously. The                            CF [id number] #[id number] was introduced through the anus                            and advanced to the the terminal  ileum, with                            identification of the appendiceal orifice and IC                            valve. The colonoscopy was performed without                            difficulty. The patient tolerated the procedure                            well. The quality of the bowel preparation was                            adequate. The terminal ileum, ileocecal valve,                            appendiceal orifice, and rectum were photographed.                            The bowel preparation used was Miralax via split                            dose instruction. Scope In: 2:23:09 PM Scope Out: 2:36:08 PM Scope Withdrawal Time: 0 hours 10 minutes 25 seconds  Total Procedure Duration: 0 hours 12 minutes 59 seconds  Findings:                 The digital rectal exam findings include anal                            stricture secondary to resection of rectal GIST                           The terminal ileum appeared normal.                           Multiple diverticula were found in the sigmoid  colon.                           Biopsies for histology were taken with a cold                            forceps from the entire colon for evaluation of                            microscopic colitis.                           The exam was otherwise without abnormality. Complications:            No immediate complications. Estimated Blood Loss:     Estimated blood loss was minimal. 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                            related to prior resection of rectal GIST                           - The examined portion of the ileum was normal.                           - Diverticulosis in the sigmoid colon.                           - The examination was otherwise normal.                           - Biopsies were taken with a cold forceps from the                            entire colon for evaluation of  microscopic colitis. Recommendation:           - Patient has a contact number available for                            emergencies. The signs and symptoms of potential                            delayed complications were discussed with the                            patient. Return to normal activities tomorrow.                            Written discharge instructions were provided to the                            patient.                           - Resume previous diet.                           -  Continue present medications.                           - Await pathology results.                           - No recommendation at this time regarding repeat                            colonoscopy. Iva Boop, MD 10/03/2022 2:43:50 PM This report has been signed electronically.

## 2022-10-03 NOTE — Progress Notes (Signed)
History and Physical Interval Note:  10/03/2022 2:12 PM  Jesse [name]  has presented today for endoscopic procedure(s), with the diagnosis of  Encounter Diagnosis  Name Primary?   Chronic diarrhea Yes  .  The various methods of evaluation and treatment have been discussed with the patient and/or family. After consideration of risks, benefits and other options for treatment, the patient has consented to  the endoscopic procedure(s).   The patient's history has been reviewed, patient examined, no change in status, stable for endoscopic procedure(s).  I have reviewed the patient's chart and labs.  Questions were answered to the patient's satisfaction.     Iva Boop, MD, Clementeen Graham

## 2022-10-03 NOTE — Progress Notes (Signed)
Patient states there have been no changes to medical or surgical history since time of pre-visit. 

## 2022-10-03 NOTE — Progress Notes (Signed)
Called to room to assist during endoscopic procedure.  Patient ID and intended procedure confirmed with present staff. Received instructions for my participation in the procedure from the performing physician.  

## 2022-10-04 ENCOUNTER — Telehealth: Payer: Self-pay

## 2022-10-04 NOTE — Telephone Encounter (Signed)
  Follow up Call-     10/03/2022    1:02 PM  Call back number  Post procedure Call Back phone  # [phone]  Permission to leave phone message Yes     Patient questions:  Do you have a fever, pain , or abdominal swelling? No. Pain Score  0 *  Have you tolerated food without any problems? Yes.    Have you been able to return to your normal activities? Yes.    Do you have any questions about your discharge instructions: Diet   No. Medications  No. Follow up visit  No.  Do you have questions or concerns about your Care? No.  Actions: * If pain score is 4 or above: No action needed, pain <4.

## 2022-10-07 NOTE — Progress Notes (Unsigned)
Office Visit    Patient Name: Jesse [name] Date of Encounter: 10/07/2022  Primary Care Provider:  [name], Jesse [name] Primary Cardiologist:  None Primary Electrophysiologist: None   Past Medical History    Past Medical History:  Diagnosis Date   Anemia    Cancer [name])    bladder cancer   COPD (chronic obstructive pulmonary disease) (HCC)    mild    DIVERTICULOSIS, COLON 01/14/2007   Qualifier: Diagnosis of  By: Jesse Ditty RN, Jesse [name]    GASTRITIS, CHRONIC 06/01/2004   Qualifier: Diagnosis of  By: Jesse [name] CMA (AAMA), Jesse [name]     GERD 01/09/2007   Qualifier: Diagnosis of  By: Jesse Cooler, RN, Jesse [name]     Heart murmur    as a child; no murmur heard 12/14/19   History of radiation therapy    Mediastinum- 05/15/22-06/28/22-Dr. Antony [name]   HYPERLIPIDEMIA 01/09/2007   Qualifier: Diagnosis of  By: Jesse Cooler RN, Jesse [name]     Hypertension    Lung mass    left upper nodule   Macular degeneration    right eye   NEOPLASM, MALIGNANT, BLADDER, HX OF 2002   Qualifier: Diagnosis of  By: Jesse [name] CMA (AAMA), Jesse [name]  / no chemo or radiation   OSTEOARTHRITIS 01/14/2007   Qualifier: Diagnosis of  By: Jesse Ditty, RN, Jesse [name] - knees   Rectal mass    Reflux esophagitis 06/01/2004   Qualifier: Diagnosis of  By: Jesse [name] CMA (AAMA), Jesse [name]     Stroke [name])    TOBACCO USER 02/16/2009   Qualifier: Diagnosis of  By: Jesse Cunas  Jesse [name], Jesse [name]    VITAMIN D DEFICIENCY 06/03/2007   Qualifier: Diagnosis of  By: Jesse Cunas  Jesse [name], Jesse [name]    Past Surgical History:  Procedure Laterality Date   BIOPSY  06/18/2022   Procedure: BIOPSY;  Surgeon: Jesse Cleverly, Jesse [name];  Location: [facility];  Service: Gastroenterology;;   BREAST BIOPSY Left 03/16/2022   [place] LT RADIOACTIVE SEED LOC 03/16/2022 [facility]   BRONCHIAL BIOPSY  10/13/2019   Procedure: BRONCHIAL BIOPSIES;  Surgeon: Jesse Igo, Jesse [name];  Location: [facility];  Service: Pulmonary;;   BRONCHIAL BRUSHINGS  10/13/2019   Procedure: BRONCHIAL BRUSHINGS;   Surgeon: Jesse Igo, Jesse [name];  [facility];  Service: Pulmonary;;   BRONCHIAL NEEDLE ASPIRATION BIOPSY  10/13/2019   Procedure: BRONCHIAL NEEDLE ASPIRATION BIOPSIES;  Surgeon: Jesse Igo, Jesse [name];  [facility];  Service: Pulmonary;;   BRONCHIAL NEEDLE ASPIRATION BIOPSY  04/11/2022   Procedure: BRONCHIAL NEEDLE ASPIRATION BIOPSIES;  Surgeon: Jesse Igo, Jesse [name];  [facility]: [facility];  Service: Cardiopulmonary;;   BRONCHIAL WASHINGS  10/13/2019   Procedure: BRONCHIAL WASHINGS;  Surgeon: Jesse Igo, Jesse [name];  [facility];  Service: Pulmonary;;   COLONOSCOPY     multiple   DIVERTING ILEOSTOMY N/A 12/14/2021   Procedure: DIVERTING ILEOSTOMY;  Surgeon: Jesse Soda, Jesse [name];  Location: [facility];  Service: General;  Laterality: N/A;   ENDOBRONCHIAL ULTRASOUND Bilateral 04/11/2022   Procedure: ENDOBRONCHIAL ULTRASOUND;  Surgeon: Jesse Igo, Jesse [name];  [facility]: [facility];  Service: Cardiopulmonary;  Laterality: Bilateral;   ESOPHAGEAL DILATION  06/18/2022   Procedure: ESOPHAGEAL DILATION;  Surgeon: Jesse Cleverly, Jesse [name];  [facility]: [facility];  Service: Gastroenterology;;   ESOPHAGOGASTRODUODENOSCOPY (EGD) WITH PROPOFOL N/A 06/18/2022   Procedure: ESOPHAGOGASTRODUODENOSCOPY (EGD) WITH PROPOFOL;  Surgeon: Jesse Cleverly, Jesse [name];  [facility];  Service: Gastroenterology;  Laterality: N/A;  Severe dysphagia/odynophagia ?radiation esophagitis versus oral candidiasis   ILEOSTOMY CLOSURE  N/A 03/22/2022   Procedure: OPEN TAKEDOWN OF LOOP ILEOSTOMY;  Surgeon: Jesse Soda, Jesse [name];  Location: [facility];  Service: General;  Laterality: N/A;  GEN w/ERAS PATHWAY LOCAL   INTERCOSTAL NERVE BLOCK Left 12/16/2019   Procedure: INTERCOSTAL NERVE BLOCK;  Surgeon: Jesse Skains, Jesse [name];  [facility];  Service: Thoracic;  Laterality: Left;   IR FLUORO RM 30-60 MIN  07/02/2022   KNEE SURGERY  07/20/2008   bilat.   NASAL SEPTUM SURGERY  1995   NODE DISSECTION Left 12/16/2019    Procedure: NODE DISSECTION;  Surgeon: Jesse Skains, Jesse [name];  [facility];  Service: Thoracic;  Laterality: Left;   PEG PLACEMENT N/A 07/04/2022   Procedure: PERCUTANEOUS ENDOSCOPIC GASTROSTOMY (PEG) PLACEMENT;  Surgeon: Jesse Boop, Jesse [name];  [facility]: [facility];  Service: Gastroenterology;  Laterality: N/A;   RADIOACTIVE SEED GUIDED AXILLARY SENTINEL LYMPH NODE Left 03/20/2022   Procedure: RADIOACTIVE SEED GUIDED LEFT AXILLARY SENTINEL LYMPH NODE BIOPSY;  Surgeon: Jesse Bouillon, Jesse [name];  [facility];  Service: General;  Laterality: Left;   RECTAL EXAM UNDER ANESTHESIA N/A 03/22/2022   Procedure: ANORECTAL EXAMINATION UNDER ANESTHESIA;  Surgeon: Jesse Soda, Jesse [name];  [facility];  Service: General;  Laterality: N/A;   TONSILLECTOMY     TRANSURETHRAL RESECTION OF BLADDER TUMOR  2002   UPPER GASTROINTESTINAL ENDOSCOPY     VIDEO BRONCHOSCOPY  04/11/2022   Procedure: VIDEO BRONCHOSCOPY;  Surgeon: Jesse Igo, Jesse [name];  [facility];  Service: Cardiopulmonary;;   VIDEO BRONCHOSCOPY WITH ENDOBRONCHIAL NAVIGATION N/A 10/13/2019   Procedure: VIDEO BRONCHOSCOPY WITH ENDOBRONCHIAL NAVIGATION;  Surgeon: Jesse Igo, Jesse [name];  [facility];  Service: Pulmonary;  Laterality: N/A;   WISDOM TOOTH EXTRACTION     XI ROBOTIC ASSISTED LOWER ANTERIOR RESECTION N/A 12/14/2021   Procedure: XI ROBOTIC ASSISTED LOWER ANTERIOR ULTRA LOW RECTOSIGMOID RESECTION, COLOANAL HAND SEWN ANASTOMOSIS, AND BILATERAL TAP BLOCK;  Surgeon: Jesse Soda, Jesse [name];  Location: [facility];  Service: General;  Laterality: N/A;    Allergies  No Known Allergies   History of Present Illness    Jesse [name]  is a 72 year old male with a PMH of HFrEF, small acute embolic CVA, HTN, HLD, bladder CA, 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  Jesse [name] was seen initially 06/13/2022 for evaluation of decreased LV function seen on TEE.  He was admitted 06/05/2022 due  to severe fatigue and was diagnosed with C. difficile with negative GI pathogen panel.  Chest x-ray was completed showing patchy right infrahilar airspace disease concerning for PNA.  He was given IV fluids due to hypotension and received IV Lasix due to mild volume overload.  Blood cultures were also obtained and is growing Klebsiella pneumonia.  MRI of the brain was also ordered due to possible brain metastasis of recurrent lung CA that showed small acute CVA.  Patient was started on ASA and Plavix and a CTA of the head and neck were obtained that showed no large vessel occlusions.  Neurology recommended 2D echo which showed EF of 40-45% with no significant valve disease.  Cardiology was consulted due to decreased LV function and patient reported no concerns of chest pain.  He was given 30-day monitor due to unknown cause of embolic CVA that showed sinus rhythm with occasional PACs but overall benign with no evidence of AF or flutter.  Since last being seen in the office patient reports***.  Patient denies chest pain, palpitations, dyspnea, PND, orthopnea,  nausea, vomiting, dizziness, syncope, edema, weight gain, or early satiety.     ***Notes: -Patient had ZIO monitor due to recent stroke Home Medications    Current Outpatient Medications  Medication Sig Dispense Refill   acetaminophen (TYLENOL) 500 MG tablet Take 1,000 mg by mouth every 8 (eight) hours as needed for moderate pain.     amLODipine (NORVASC) 5 MG tablet Take 1 tablet (5 mg total) by mouth daily. 90 tablet 3   aspirin 81 MG chewable tablet Chew 1 tablet (81 mg total) by mouth daily.     atorvastatin (LIPITOR) 40 MG tablet Take 1/2 tablet (20 mg total) by mouth daily. 90 tablet 3   Cholecalciferol (VITAMIN D3 PO) Take 2,000 Units by mouth daily.     cholestyramine (QUESTRAN) 4 GM/DOSE powder Take 4 g by mouth 2 (two) times daily.     diphenoxylate-atropine (LOMOTIL) 2.5-0.025 MG tablet Take 2 tablets by mouth 3 (three) times daily  as needed for diarrhea or loose stools. 45 tablet 0   ferrous sulfate 325 (65 FE) MG EC tablet Take 1 tablet (325 mg total) by mouth 2 (two) times daily. 60 tablet 1   imatinib (GLEEVEC) 400 MG tablet Take 1 tablet (400 mg total) by mouth daily. Take with meals and large glass of water.Caution:Chemotherapy. (Patient taking differently: Take 400 mg by mouth at bedtime. Take with meals and large glass of water.Caution:Chemotherapy.) 30 tablet 3   lidocaine (XYLOCAINE) 2 % solution Use as directed 15 mLs in the mouth or throat 4 (four) times daily -  before meals and at bedtime. 420 mL 1   lidocaine (XYLOCAINE) 5 % ointment Apply 1 Application topically 3 (three) times daily as needed for mild pain or moderate pain. 35.44 g 3   loperamide (IMODIUM A-D) 2 MG tablet Take 4 mg by mouth 4 (four) times daily as needed for diarrhea or loose stools.     Multiple Vitamins-Minerals (PRESERVISION AREDS 2) CAPS Take 1 capsule by mouth 2 (two) times daily.     Nutritional Supplements (FEEDING SUPPLEMENT, JEVITY 1.5 CAL/FIBER,) LIQD Change TF to 1 carton Osmolite 1.5 + 1 carton Jevity 1.5 with 60 mL free water before and after feedings TID via PEG. 711 mL ML   Nutritional Supplements (FEEDING SUPPLEMENT, OSMOLITE 1.5 CAL,) LIQD Change TF to 1 carton Osmolite 1.5 + 1 carton Jevity 1.5 with 60 mL free water before and after feedings TID via PEG. 711 mL 12   pantoprazole (PROTONIX) 40 MG tablet Take 1 tablet (40 mg total) by mouth 2 (two) times daily. 60 tablet 3   sucralfate (CARAFATE) 1 g tablet Take 1 tablet by mouth 4 times daily -  with meals and at bedtime. (Patient taking differently: Take 1 g by mouth 2 (two) times daily.) 120 tablet 11   No current facility-administered medications for this visit.     Review of Systems  Please see the history of present illness.    (+)*** (+)***  All other systems reviewed and are otherwise negative except as noted above.  Physical Exam    Wt Readings from Last 3  Encounters:  10/03/22 141 lb (64 kg)  09/19/22 141 lb 8 oz (64.2 kg)  09/17/22 144 lb 3.2 oz (65.4 kg)   RU:EAVWU were no vitals filed for this visit.,There is no height or weight on file to calculate BMI.  Constitutional:      Appearance: Healthy appearance. Not in distress.  Neck:     Vascular: JVD normal.  Pulmonary:  Effort: Pulmonary effort is normal.     Breath sounds: No wheezing. No rales. Diminished in the bases Cardiovascular:     Normal rate. Regular rhythm. Normal S1. Normal S2.      Murmurs: There is no murmur.  Edema:    Peripheral edema absent.  Abdominal:     Palpations: Abdomen is soft non tender. There is no hepatomegaly.  Skin:    General: Skin is warm and dry.  Neurological:     General: No focal deficit present.     Mental Status: Alert and oriented to person, place and time.     Cranial Nerves: Cranial nerves are intact.  EKG/LABS/ Recent Cardiac Studies    ECG personally reviewed by me today - ***  Cardiac Studies & Procedures       ECHOCARDIOGRAM  ECHOCARDIOGRAM COMPLETE 06/12/2022  Narrative ECHOCARDIOGRAM REPORT    Patient Name:   ARCHIMEDES HAROLD Date of Exam: 06/12/2022 Medical Rec #:  [id number]       Height:       69.0 in Accession #:    [id number]      Weight:       145.9 lb Date of Birth:  Nov 29, 1950       BSA:          1.807 m Patient Age:    71 years        BP:           141/96 mmHg Patient Gender: M               HR:           106 bpm. Exam Location:  Inpatient  Procedure: 2D Echo, Cardiac Doppler, Color Doppler and Intracardiac Opacification Agent  Indications:    Stroke  History:        Patient has no prior history of Echocardiogram examinations. Signs/Symptoms:Bacteremia; Risk Factors:Hypertension and Dyslipidemia. Lung cancer. Chemo.  Sonographer:    Sheralyn Boatman RDCS Referring Phys: [id number] AMRIT ADHIKARI   Sonographer Comments: Technically difficult study due to poor echo windows, Technically challenging study due to  limited acoustic windows, suboptimal parasternal window, suboptimal apical window and suboptimal subcostal window. Extremely difficult study. IMPRESSIONS   1. Left ventricular ejection fraction, by estimation, is 40 to 45%. The left ventricle has mildly decreased function. The left ventricle demonstrates global hypokinesis. The left ventricular internal cavity size was mildly dilated. Indeterminate diastolic filling due to E-A fusion. 2. Right ventricular systolic function is normal. The right ventricular size is normal. Tricuspid regurgitation signal is inadequate for assessing PA pressure. 3. The mitral valve is grossly normal. No evidence of mitral valve regurgitation. 4. The aortic valve is grossly normal. Aortic valve regurgitation is not visualized. No aortic stenosis is present. 5. The inferior vena cava is normal in size with greater than 50% respiratory variability, suggesting right atrial pressure of 3 mmHg.  Conclusion(s)/Recommendation(s): No evidence of valvular vegetations on this transthoracic echocardiogram. Consider a transesophageal echocardiogram to exclude infective endocarditis if clinically indicated.  FINDINGS Left Ventricle: No left ventricular thrombus is seen (Definity contrast used). Left ventricular ejection fraction, by estimation, is 40 to 45%. The left ventricle has mildly decreased function. The left ventricle demonstrates global hypokinesis. Definity contrast agent was given IV to delineate the left ventricular endocardial borders. The left ventricular internal cavity size was mildly dilated. There is no left ventricular hypertrophy. Indeterminate diastolic filling due to E-A fusion.  Right Ventricle: The right ventricular size is normal. No increase in right  ventricular wall thickness. Right ventricular systolic function is normal. Tricuspid regurgitation signal is inadequate for assessing PA pressure.  Left Atrium: Left atrial size was normal in size.  Right  Atrium: Right atrial size was normal in size.  Pericardium: There is no evidence of pericardial effusion.  Mitral Valve: The mitral valve is grossly normal. Mild mitral annular calcification. No evidence of mitral valve regurgitation.  Tricuspid Valve: The tricuspid valve is normal in structure. Tricuspid valve regurgitation is not demonstrated.  Aortic Valve: The aortic valve is grossly normal. Aortic valve regurgitation is not visualized. No aortic stenosis is present.  Pulmonic Valve: The pulmonic valve was not well visualized.  Aorta: The aortic root is normal in size and structure.  Venous: The inferior vena cava is normal in size with greater than 50% respiratory variability, suggesting right atrial pressure of 3 mmHg.  IAS/Shunts: The interatrial septum was not well visualized.   LEFT VENTRICLE PLAX 2D LVIDd:         5.70 cm      Diastology LVIDs:         4.50 cm      LV e' medial:    5.66 cm/s LV PW:         1.00 cm      LV E/e' medial:  9.5 LV IVS:        1.10 cm      LV e' lateral:   10.00 cm/s LVOT diam:     2.50 cm      LV E/e' lateral: 5.4 LV SV:         54 LV SV Index:   30 LVOT Area:     4.91 cm  LV Volumes (MOD) LV vol d, MOD A2C: 99.6 ml LV vol d, MOD A4C: 122.0 ml LV vol s, MOD A2C: 59.5 ml LV vol s, MOD A4C: 70.9 ml LV SV MOD A2C:     40.1 ml LV SV MOD A4C:     122.0 ml LV SV MOD BP:      46.6 ml  RIGHT VENTRICLE             IVC RV S prime:     12.00 cm/s  IVC diam: 1.80 cm TAPSE (M-mode): 2.1 cm  LEFT ATRIUM             Index        RIGHT ATRIUM           Index LA diam:        3.10 cm 1.72 cm/m   RA Area:     10.70 cm LA Vol (A2C):   37.0 ml 20.48 ml/m  RA Volume:   21.20 ml  11.73 ml/m LA Vol (A4C):   33.6 ml 18.59 ml/m LA Biplane Vol: 36.7 ml 20.31 ml/m AORTIC VALVE LVOT Vmax:   83.10 cm/s LVOT Vmean:  54.700 cm/s LVOT VTI:    0.109 m  AORTA Ao Root diam: 3.70 cm  MITRAL VALVE MV Area (PHT): 6.32 cm    SHUNTS MV Decel Time: 120  msec    Systemic VTI:  0.11 m MV E velocity: 54.00 cm/s  Systemic Diam: 2.50 cm MV A velocity: 90.80 cm/s MV E/A ratio:  0.59  Mihai Croitoru Jesse [name] Electronically signed by Thurmon Fair Jesse [name] Signature Date/Time: 06/12/2022/10:54:52 AM    Final    MONITORS  LONG TERM MONITOR (3-14 DAYS) 08/14/2022  Narrative Patch Wear Time:  13 days and 17 hours (2024-04-05T16:01:00-0400 to 2024-04-19T09:17:59-0400)  Predominant rhythm  is SR Overall heart rates ranged from 62 to 222 bpm  Average HR 93 bpm     Rare PACs, PVCs .   There were 6 runs of VT, fastest and longest lasting 22.2 seconds (with fastest interval  222 bpm and average rate of 147 bpm); There were 10 runs SVT, longest lasting 13 beats with average rate of 153 bpm  No triggered events or diary entries _______________________________________________________________________________________  Patient had a min HR of 62 bpm, max HR of 222 bpm, and avg HR of 93 bpm. Predominant underlying rhythm was Sinus Rhythm. Slight P wave morphology changes were noted. 6 Ventricular Tachycardia runs occurred, the run with the fastest interval lasting 22.2 secs with a max rate of 222 bpm (avg 147 bpm); the run with the fastest interval was also the longest. 10 Supraventricular Tachycardia runs occurred, the run with the fastest interval lasting 5 beats with a max rate of 193 bpm, the longest lasting 13 beats with an avg rate of 153 bpm. Isolated SVEs were rare (<1.0%), SVE Couplets were rare (<1.0%), and SVE Triplets were rare (<1.0%). Isolated VEs were rare (<1.0%), VE Couplets were rare (<1.0%), and no VE Triplets were present.           Risk Assessment/Calculations:   {Does this patient have ATRIAL FIBRILLATION?[phone]}        Lab Results  Component Value Date   WBC 4.6 08/06/2022   HGB 8.8 (L) 08/06/2022   HCT 28.6 (L) 08/06/2022   MCV 96.9 08/06/2022   PLT 218 08/06/2022   Lab Results  Component Value Date   CREATININE 0.69 08/06/2022    BUN 26 (H) 08/06/2022   NA 135 08/06/2022   K 4.2 08/06/2022   CL 99 08/06/2022   CO2 28 08/06/2022   Lab Results  Component Value Date   ALT 27 07/24/2022   AST 26 07/24/2022   ALKPHOS 116 07/24/2022   BILITOT 0.3 07/24/2022   Lab Results  Component Value Date   CHOL 83 06/12/2022   HDL 13 (L) 06/12/2022   LDLCALC 38 06/12/2022   LDLDIRECT 102.0 10/22/2018   TRIG 160 (H) 06/12/2022   CHOLHDL 6.4 06/12/2022    Lab Results  Component Value Date   HGBA1C 6.2 (H) 06/12/2022     Assessment & Plan    1.  History of CVA:  2.  LV dysfunction:  3. Hx of NSCLC:  4.  Thrombocytopenia:      Disposition: Follow-up with None or APP in *** months {Are you ordering a CV Procedure (e.g. stress test, cath, DCCV, TEE, etc)?   Press F2        :[id number]}   Medication Adjustments/Labs and Tests Ordered: Current medicines are reviewed at length with the patient today.  Concerns regarding medicines are outlined above.   Signed, Napoleon Form, Leodis Rains, NP 10/07/2022, 3:15 PM [facility]

## 2022-10-08 ENCOUNTER — Ambulatory Visit: Payer: Medicare Other | Attending: Nurse Practitioner | Admitting: Nurse Practitioner

## 2022-10-08 ENCOUNTER — Encounter: Payer: Self-pay | Admitting: Nurse Practitioner

## 2022-10-08 VITALS — BP 108/58 | HR 105 | Ht 69.0 in | Wt 141.2 lb

## 2022-10-08 DIAGNOSIS — I519 Heart disease, unspecified: Secondary | ICD-10-CM

## 2022-10-08 DIAGNOSIS — I10 Essential (primary) hypertension: Secondary | ICD-10-CM

## 2022-10-08 DIAGNOSIS — I2510 Atherosclerotic heart disease of native coronary artery without angina pectoris: Secondary | ICD-10-CM | POA: Diagnosis not present

## 2022-10-08 DIAGNOSIS — C3490 Malignant neoplasm of unspecified part of unspecified bronchus or lung: Secondary | ICD-10-CM | POA: Diagnosis not present

## 2022-10-08 DIAGNOSIS — I2584 Coronary atherosclerosis due to calcified coronary lesion: Secondary | ICD-10-CM

## 2022-10-08 DIAGNOSIS — D696 Thrombocytopenia, unspecified: Secondary | ICD-10-CM

## 2022-10-08 DIAGNOSIS — Z8673 Personal history of transient ischemic attack (TIA), and cerebral infarction without residual deficits: Secondary | ICD-10-CM

## 2022-10-08 NOTE — Patient Instructions (Signed)
Medication Instructions:  STOP Norvasc  *If you need a refill on your cardiac medications before your next appointment, please call your pharmacy*   Lab Work: None ordered   Testing/Procedures: Your physician has requested that you have an echocardiogram. Echocardiography is a painless test that uses sound waves to create images of your heart. It provides your doctor with information about the size and shape of your heart and how well your heart's chambers and valves are working. This procedure takes approximately one hour. There are no restrictions for this procedure. Please do NOT wear cologne, perfume, aftershave, or lotions (deodorant is allowed). Please arrive 15 minutes prior to your appointment time.   Follow-Up: At [facility], you and your health needs are our priority.  As part of our continuing mission to provide you with exceptional heart care, we have created designated Provider Care Teams.  These Care Teams include your primary Cardiologist (physician) and Advanced Practice Providers (APPs -  Physician Assistants and Nurse Practitioners) who all work together to provide you with the care you need, when you need it.  We recommend signing up for the patient portal called "MyChart".  Sign up information is provided on this After Visit Summary.  MyChart is used to connect with patients for Virtual Visits (Telemedicine).  Patients are able to view lab/test results, encounter notes, upcoming appointments, etc.  Non-urgent messages can be sent to your provider as well.   To learn more about what you can do with MyChart, go to [email or web address].    Your next appointment:   FOLLOW UP AS NEEDED   Provider:   Pricilla Riffle, MD  Other Instructions

## 2022-10-11 NOTE — Therapy (Signed)
OUTPATIENT PHYSICAL THERAPY MALE PELVIC TREATMENT   Patient Name: Jesse [name] MRN: [id number] DOB:06/16/1950, 72 y.o., male Today's Date: 10/12/2022  END OF SESSION:  PT End of Session - 10/12/22 1050     Visit Number 7    Date for PT Re-Evaluation 10/30/22    Authorization Type [employer]    PT Start Time 1050    PT Stop Time 1130    PT Time Calculation (min) 40 min    Activity Tolerance Patient tolerated treatment well    Behavior During Therapy WFL for tasks assessed/performed                   Past Medical History:  Diagnosis Date   Anemia    Cancer (HCC)    bladder cancer   COPD (chronic obstructive pulmonary disease) (HCC)    mild    DIVERTICULOSIS, COLON 01/14/2007   Qualifier: Diagnosis of  By: Marcelyn Ditty RN, Katy Fitch    GASTRITIS, CHRONIC 06/01/2004   Qualifier: Diagnosis of  By: Creta Levin CMA (AAMA), Robin     GERD 01/09/2007   Qualifier: Diagnosis of  By: Tawanna Cooler, RN, Ellen     Heart murmur    as a child; no murmur heard 12/14/19   History of radiation therapy    Mediastinum- 05/15/22-06/28/22-Dr. Antony Blackbird   HYPERLIPIDEMIA 01/09/2007   Qualifier: Diagnosis of  By: Tawanna Cooler RN, Alvino Chapel     Hypertension    Lung mass    left upper nodule   Macular degeneration    right eye   NEOPLASM, MALIGNANT, BLADDER, HX OF 2002   Qualifier: Diagnosis of  By: Creta Levin CMA (AAMA), Robin  / no chemo or radiation   OSTEOARTHRITIS 01/14/2007   Qualifier: Diagnosis of  By: Marcelyn Ditty, RN, Katy Fitch - knees   Rectal mass    Reflux esophagitis 06/01/2004   Qualifier: Diagnosis of  By: Creta Levin CMA (AAMA), Robin     Stroke [facility])    TOBACCO USER 02/16/2009   Qualifier: Diagnosis of  By: Amador Cunas  MD, Janett Labella    VITAMIN D DEFICIENCY 06/03/2007   Qualifier: Diagnosis of  By: Amador Cunas  MD, Janett Labella    Past Surgical History:  Procedure Laterality Date   BIOPSY  06/18/2022   Procedure: BIOPSY;  Surgeon: Shellia Cleverly, DO;  Location: [facility];  Service:  Gastroenterology;;   BREAST BIOPSY Left 03/16/2022   [place] LT RADIOACTIVE SEED LOC 03/16/2022 [facility]   BRONCHIAL BIOPSY  10/13/2019   Procedure: BRONCHIAL BIOPSIES;  Surgeon: Josephine Igo, DO;  Location: [facility];  Service: Pulmonary;;   BRONCHIAL BRUSHINGS  10/13/2019   Procedure: BRONCHIAL BRUSHINGS;  Surgeon: Josephine Igo, DO;  [facility]: [facility];  Service: Pulmonary;;   BRONCHIAL NEEDLE ASPIRATION BIOPSY  10/13/2019   Procedure: BRONCHIAL NEEDLE ASPIRATION BIOPSIES;  Surgeon: Josephine Igo, DO;  [facility];  Service: Pulmonary;;   BRONCHIAL NEEDLE ASPIRATION BIOPSY  04/11/2022   Procedure: BRONCHIAL NEEDLE ASPIRATION BIOPSIES;  Surgeon: Josephine Igo, DO;  Location: [facility];  Service: Cardiopulmonary;;   BRONCHIAL WASHINGS  10/13/2019   Procedure: BRONCHIAL WASHINGS;  Surgeon: Josephine Igo, DO;  [facility];  Service: Pulmonary;;   COLONOSCOPY     multiple   DIVERTING ILEOSTOMY N/A 12/14/2021   Procedure: DIVERTING ILEOSTOMY;  Surgeon: Karie Soda, MD;  Location: [facility];  Service: General;  Laterality: N/A;   ENDOBRONCHIAL ULTRASOUND Bilateral 04/11/2022   Procedure: ENDOBRONCHIAL ULTRASOUND;  Surgeon: Josephine Igo, DO;  Location:  [facility];  Service: Cardiopulmonary;  Laterality: Bilateral;   ESOPHAGEAL DILATION  06/18/2022   Procedure: ESOPHAGEAL DILATION;  Surgeon: Shellia Cleverly, DO;  [facility]: [facility];  Service: Gastroenterology;;   ESOPHAGOGASTRODUODENOSCOPY (EGD) WITH PROPOFOL N/A 06/18/2022   Procedure: ESOPHAGOGASTRODUODENOSCOPY (EGD) WITH PROPOFOL;  Surgeon: Shellia Cleverly, DO;  [facility]: [facility];  Service: Gastroenterology;  Laterality: N/A;  Severe dysphagia/odynophagia ?radiation esophagitis versus oral candidiasis   ILEOSTOMY CLOSURE N/A 03/22/2022   Procedure: OPEN TAKEDOWN OF LOOP ILEOSTOMY;  Surgeon: Karie Soda, MD;  Location: [facility];  Service: General;  Laterality: N/A;  GEN w/ERAS  PATHWAY LOCAL   INTERCOSTAL NERVE BLOCK Left 12/16/2019   Procedure: INTERCOSTAL NERVE BLOCK;  Surgeon: Corliss Skains, MD;  Location: [facility];  Service: Thoracic;  Laterality: Left;   IR FLUORO RM 30-60 MIN  07/02/2022   KNEE SURGERY  07/20/2008   bilat.   NASAL SEPTUM SURGERY  1995   NODE DISSECTION Left 12/16/2019   Procedure: NODE DISSECTION;  Surgeon: Corliss Skains, MD;  Location: [facility];  Service: Thoracic;  Laterality: Left;   PEG PLACEMENT N/A 07/04/2022   Procedure: PERCUTANEOUS ENDOSCOPIC GASTROSTOMY (PEG) PLACEMENT;  Surgeon: Iva Boop, MD;  Location: [facility];  Service: Gastroenterology;  Laterality: N/A;   RADIOACTIVE SEED GUIDED AXILLARY SENTINEL LYMPH NODE Left 03/20/2022   Procedure: RADIOACTIVE SEED GUIDED LEFT AXILLARY SENTINEL LYMPH NODE BIOPSY;  Surgeon: Harriette Bouillon, MD;  [facility]: [facility];  Service: General;  Laterality: Left;   RECTAL EXAM UNDER ANESTHESIA N/A 03/22/2022   Procedure: ANORECTAL EXAMINATION UNDER ANESTHESIA;  Surgeon: Karie Soda, MD;  Location: [facility];  Service: General;  Laterality: N/A;   TONSILLECTOMY     TRANSURETHRAL RESECTION OF BLADDER TUMOR  2002   UPPER GASTROINTESTINAL ENDOSCOPY     VIDEO BRONCHOSCOPY  04/11/2022   Procedure: VIDEO BRONCHOSCOPY;  Surgeon: Josephine Igo, DO;  [facility]: [facility];  Service: Cardiopulmonary;;   VIDEO BRONCHOSCOPY WITH ENDOBRONCHIAL NAVIGATION N/A 10/13/2019   Procedure: VIDEO BRONCHOSCOPY WITH ENDOBRONCHIAL NAVIGATION;  Surgeon: Josephine Igo, DO;  [facility];  Service: Pulmonary;  Laterality: N/A;   WISDOM TOOTH EXTRACTION     XI ROBOTIC ASSISTED LOWER ANTERIOR RESECTION N/A 12/14/2021   Procedure: XI ROBOTIC ASSISTED LOWER ANTERIOR ULTRA LOW RECTOSIGMOID RESECTION, COLOANAL HAND SEWN ANASTOMOSIS, AND BILATERAL TAP BLOCK;  Surgeon: Karie Soda, MD;  Location: [facility];  Service: General;  Laterality: N/A;   Patient Active Problem List   Diagnosis Date Noted   Acute ischemic  stroke, likely embolic 07/02/2022   Esophageal dysphagia 06/18/2022   Esophageal stricture 06/18/2022   Hiatal hernia 06/18/2022   Radiation-induced esophagitis 06/18/2022   Pancytopenia due to chemotherapy 06/07/2022   Protein-calorie malnutrition, severe 06/07/2022   Severe sepsis due to Klebsiella pneumonia and bacteremia 06/05/2022   AKI (acute kidney injury) (HCC) 06/05/2022   Hyponatremia 06/05/2022   Acute metabolic encephalopathy 06/05/2022   Acute respiratory failure with hypoxia (HCC) 06/05/2022   Acute on chronic diarrhea 06/05/2022   Weight loss 05/28/2022   Malignant neoplasm of lung (HCC) 04/30/2022   Adenopathy 04/06/2022   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   Encounter for antineoplastic chemotherapy 01/28/2020   Adenocarcinoma of left lung, stage 2 (HCC) 12/31/2019   Goals of care, counseling/discussion 12/31/2019   S/P lobectomy of lung 12/16/2019   Lung nodule 10/13/2019   Essential hypertension 09/28/2016   Coronary artery calcification 09/28/2016   Impaired glucose tolerance 09/28/2016   Vitamin D deficiency 06/03/2007  DIVERTICULOSIS, COLON 01/14/2007   Dyslipidemia 01/09/2007   GERD 01/09/2007   REFLUX ESOPHAGITIS 06/01/2004   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    PCP: [name], MD   REFERRING PROVIDER: Karie Soda, MD   REFERRING DIAG: R15.2 (ICD-10-CM) - Rectal urgency   THERAPY DIAG:  Muscle weakness (generalized)  Other muscle spasm  Unspecified lack of coordination  Abnormal posture  Rationale for Evaluation and Treatment: Rehabilitation  ONSET DATE: 03-2022 ostomy reversal and worse since that time  SUBJECTIVE:                                                                                                                                                                                           SUBJECTIVE STATEMENT: Pt is still having 4-5 bad days/week.    Fluid intake: 6x 12 oz bottles  water.  Pt has feeding tube in place and takes pint with each feeding via the feeding tube, drinking milk  PAIN:  Are you having pain? Yes NPRS scale: 5ish/10 Pain location: Anal  Pain type: burning Pain description: intermittent   Aggravating factors: bowel movements Relieving factors: nothing a little better with the butt paste  PRECAUTIONS: None  WEIGHT BEARING RESTRICTIONS: No  FALLS:  Has patient fallen in last 6 months? No  LIVING ENVIRONMENT: Lives with: lives with their spouse Lives in: House/apartment   OCCUPATION: no  PLOF: Independent  PATIENT GOALS: be able to work in the yard, want to play golf this summer  PERTINENT HISTORY:  lung cancer, bladder cancer, GIST, HLD, HTN, CVA, CAD, GERD, OA    BOWEL MOVEMENT: Pain with bowel movement: Yes Type of bowel movement:Type (B[place] Stool Scale) normal, Frequency 30 min or more, and Strain Yes sometimes Fully empty rectum: No Leakage: Yes: on the way to the bathroom Pads: Yes: diapers 4-5/day Fiber supplement: Yes:    URINATION: Pain with urination: No Fully empty bladder: Yes:    Leakage:  no Pads: No  INTERCOURSE: Not currently   OBJECTIVE:     PATIENT SURVEYS:    PFIQ-7   COGNITION: Overall cognitive status: Within functional limits for tasks assessed     SENSATION: Light touch: Appears intact Proprioception:   MUSCLE LENGTH: Hamstrings:   LUMBAR SPECIAL TESTS:    FUNCTIONAL TESTS:    GAIT: slow cadence, flexed posture   POSTURE: rounded shoulders, forward head, and increased thoracic kyphosis  PELVIC ALIGNMENT:  LUMBARAROM/PROM:  A/PROM A/PROM  eval  Flexion   Extension   Right lateral flexion   Left lateral flexion   Right rotation   Left rotation    (Blank rows = not tested)  LOWER EXTREMITY AROM/PROM:  A/PROM Right eval Left eval  Hip flexion    Hip extension    Hip abduction    Hip adduction    Hip internal rotation    Hip external rotation     Knee flexion    Knee extension    Ankle dorsiflexion    Ankle plantarflexion    Ankle inversion    Ankle eversion     (Blank rows = not tested)  LOWER EXTREMITY MMT:  MMT Right eval Left eval  Hip flexion    Hip extension    Hip abduction    Hip adduction    Hip internal rotation    Hip external rotation    Knee flexion    Knee extension    Ankle dorsiflexion    Ankle plantarflexion    Ankle inversion    Ankle eversion     PALPATION: GENERAL generally low muscle tone              External Perineal Exam anal wink reflex present              Internal Pelvic Floor tight and dyssynergic bulging Patient confirms identification and approves PT to assess internal pelvic floor and treatment Yes - exam to posterior pelvic floor rectally  PELVIC MMT:   MMT eval 10/12/22   Internal Anal Sphincter 2/5 2  External Anal Sphincter 2/5 2  Puborectalis 2/5 2  Diastasis Recti    (Blank rows = not tested)  TONE: high  TODAY'S TREATMENT:                                                                                                                              DATE: 10/12/22   Exercise: Seated lumbar/thoracic ext - ball behind back -  Drawing sword with ext - red band ball behind back - 20x Shoulder ext with ball behind - red band - 20x  Education on rectal dilators - where to purchase 4 smaller sizes and using on the toilet  Breathing and bulging pelvic floor  09/13/22   Exercise: Prone hip ext - 10x Single arm reaches prone 10x Dead lift modified with cane progression to no cues - 3 x 10 Mini squat - 3 x 10 (touching the mat)         PATIENT EDUCATION:  Education details:  Access Code: [email or web address] Person educated: Patient and Spouse Education method: Explanation, Demonstration, Tactile cues, Verbal cues, and Handouts Education comprehension: verbalized understanding and needs further education  HOME EXERCISE PROGRAM: Access Code: [email or web address] URL:  [email or web address] Date: 10/12/2022 Prepared by: Dwana Curd  Exercises - Supine Hip Internal and External Rotation  - 1 x daily - 7 x weekly - 1 sets - 10 reps - 5 sec hold - Supine Piriformis Stretch with Leg Straight  - 1 x daily - 7 x weekly - 1 sets - 3 reps - 30 sec hold - Supine Figure 4 Piriformis Stretch  - 1 x daily -  7 x weekly - 1 sets - 3 reps - 30 sec hold - Supine Double Knee to Chest  - 1 x daily - 7 x weekly - 1 sets - 3 reps - 30 hold - Modified Cat Cow on Counter  - 1 x daily - 7 x weekly - 3 sets - 10 reps - Standing Hip Circles  - 1 x daily - 7 x weekly - 3 sets - 10 reps - Plank with Thoracic Rotation on Counter  - 1 x daily - 7 x weekly - 3 sets - 10 reps - Drawing Bow  - 1 x daily - 7 x weekly - 3 sets - 10 reps - Supine Bridge  - 1 x daily - 7 x weekly - 3 sets - 10 reps - Mini Squat with Chair  - 1 x daily - 7 x weekly - 3 sets - 10 reps - [place] Deadlift- Hands on Hips  - 1 x daily - 7 x weekly - 3 sets - 10 reps - Seated Shoulder Horizontal Abduction with Resistance  - 1 x daily - 7 x weekly - 3 sets - 10 reps - Seated Shoulder Diagonal Pulls with Resistance  - 1 x daily - 7 x weekly - 3 sets - 10 reps - Seated Scapular Protraction with Resistance  - 1 x daily - 7 x weekly - 3 sets - 10 reps - Seated Shoulder Front Raise with Resistance  - 1 x daily - 7 x weekly - 3 sets - 10 reps  ASSESSMENT:  CLINICAL IMPRESSION: Pt still having a lot of pain with internal assessment.  Attempting to stretch internal anal sphincter and puborectalis.  Pt cued for breathing techniques and did have less pain and able to bulge but not consistently and still really has to think about it.  Pt was educated and strongly encouraged to do use rectal dilators as the muscle spasm and difficulty relaxing due to pain is preventing full evacuation of stool.  Pt feels that he can continue to work on things at home and feels confident with d/c plan today.  OBJECTIVE  IMPAIRMENTS: decreased activity tolerance, decreased coordination, decreased endurance, decreased ROM, decreased strength, increased muscle spasms, impaired flexibility, impaired tone, postural dysfunction, and pain.   ACTIVITY LIMITATIONS: sitting, sleeping, continence, and toileting  PARTICIPATION LIMITATIONS: interpersonal relationship, community activity, and yard work  PERSONAL FACTORS: 3+ comorbidities: lung cancer, bladder cancer, GIST, HLD, HTN, CVA, CAD, GERD, OA , ileostomy reversal, feeding tube  are also affecting patient's functional outcome.   REHAB POTENTIAL: Good  CLINICAL DECISION MAKING: Evolving/moderate complexity  EVALUATION COMPLEXITY: Moderate   GOALS: Goals reviewed with patient? Yes  SHORT TERM GOALS: Target date: 09/04/22 Updated 08/21/22  Pt will be ind with toileting techniques Baseline: Goal status: MET  2.  Pt will report 25% less anal pain due to improved skin hygiene and more complete bowel movements Baseline:  Goal status: NOT MET  3.  Pt will be ind with initial HEP Baseline:  Goal status: MET    LONG TERM GOALS: Target date: 10/30/22 Updated 09/04/22  Pt will be independent with advanced HEP to maintain improvements made throughout therapy  Baseline:  Goal status: MET  2.  Pt will report 80% reduction of pain due to improvements in posture, strength, and muscle length  Baseline: same Goal status: NOT MET  3.  Pt will be able to functional actions such as walking to the bathroom without leakage at least 80% of the time  for reduced use of pads/diapers Baseline: 2 per day (down from 5 or more) Goal status: MET  4.  Pt will be able to reduce number of bowel movements per day to 3-5 due to more complete emptying Baseline: 4-5 bad days/week (15-20% better) Goal status: NOT MET  5.  Pt will be able to sleep at least 4 consecutive hours with waking 1-2x at most to use the bathroom Baseline: 1-1.5 in a row Goal status: NOT  MET     PLAN:  PT FREQUENCY: 1-2x/week  PT DURATION: 12 weeks  PLANNED INTERVENTIONS: Therapeutic exercises, Therapeutic activity, Neuromuscular re-education, Balance training, Gait training, Patient/Family education, Self Care, Joint mobilization, Dry Needling, Electrical stimulation, Cryotherapy, Moist heat, Taping, Traction, Biofeedback, Manual therapy, and Re-evaluation  PLAN FOR NEXT SESSION:   d/c today   Junious Silk, PT 10/12/2022, 10:51 AM   PHYSICAL THERAPY DISCHARGE SUMMARY  Visits from Start of Care: 7  Current functional level related to goals / functional outcomes: See above goals   Remaining deficits: See above details   Education / Equipment: HEP, dilators (pt will purchase)  Patient agrees to discharge. Patient goals were partially met. Patient is being discharged due to lack of progress. And slow progress, maybe continue to make progress with dilators and continued practice on bulging pelvic floor at home.  Russella Dar, PT, DPT 10/12/22 12:01 PM

## 2022-10-11 NOTE — Progress Notes (Signed)
[name],  The colon biopsies are essentially normal.  I still think it is possible that the Gleevec (imatnib) could be causing the diarrhea and stopping it makes sense to see if that is so. I am copying Dr. Arbutus Ped and Dr. Michaell Cowing on this.  I do not think any routine repeat colonoscopy is needed.  Best regards,  Iva Boop, MD, [facility]

## 2022-10-12 ENCOUNTER — Ambulatory Visit: Payer: Medicare Other | Attending: Surgery | Admitting: Physical Therapy

## 2022-10-12 ENCOUNTER — Encounter: Payer: Self-pay | Admitting: Physical Therapy

## 2022-10-12 DIAGNOSIS — R279 Unspecified lack of coordination: Secondary | ICD-10-CM | POA: Insufficient documentation

## 2022-10-12 DIAGNOSIS — R293 Abnormal posture: Secondary | ICD-10-CM | POA: Diagnosis not present

## 2022-10-12 DIAGNOSIS — M6281 Muscle weakness (generalized): Secondary | ICD-10-CM | POA: Diagnosis not present

## 2022-10-12 DIAGNOSIS — M62838 Other muscle spasm: Secondary | ICD-10-CM | POA: Insufficient documentation

## 2022-10-13 DIAGNOSIS — R1314 Dysphagia, pharyngoesophageal phase: Secondary | ICD-10-CM | POA: Diagnosis not present

## 2022-10-13 DIAGNOSIS — E43 Unspecified severe protein-calorie malnutrition: Secondary | ICD-10-CM | POA: Diagnosis not present

## 2022-10-13 DIAGNOSIS — M6281 Muscle weakness (generalized): Secondary | ICD-10-CM | POA: Diagnosis not present

## 2022-10-13 DIAGNOSIS — Z8673 Personal history of transient ischemic attack (TIA), and cerebral infarction without residual deficits: Secondary | ICD-10-CM | POA: Diagnosis not present

## 2022-10-13 DIAGNOSIS — G9340 Encephalopathy, unspecified: Secondary | ICD-10-CM | POA: Diagnosis not present

## 2022-10-14 DIAGNOSIS — Z8673 Personal history of transient ischemic attack (TIA), and cerebral infarction without residual deficits: Secondary | ICD-10-CM | POA: Diagnosis not present

## 2022-10-14 DIAGNOSIS — G9340 Encephalopathy, unspecified: Secondary | ICD-10-CM | POA: Diagnosis not present

## 2022-10-14 DIAGNOSIS — R1314 Dysphagia, pharyngoesophageal phase: Secondary | ICD-10-CM | POA: Diagnosis not present

## 2022-10-14 DIAGNOSIS — E43 Unspecified severe protein-calorie malnutrition: Secondary | ICD-10-CM | POA: Diagnosis not present

## 2022-10-14 DIAGNOSIS — M6281 Muscle weakness (generalized): Secondary | ICD-10-CM | POA: Diagnosis not present

## 2022-10-17 ENCOUNTER — Ambulatory Visit: Payer: Medicare Other | Admitting: Internal Medicine

## 2022-10-19 ENCOUNTER — Other Ambulatory Visit: Payer: Self-pay

## 2022-10-19 DIAGNOSIS — Z515 Encounter for palliative care: Secondary | ICD-10-CM

## 2022-10-19 DIAGNOSIS — R197 Diarrhea, unspecified: Secondary | ICD-10-CM

## 2022-10-19 DIAGNOSIS — C49A5 Gastrointestinal stromal tumor of rectum: Secondary | ICD-10-CM

## 2022-10-19 MED ORDER — DIPHENOXYLATE-ATROPINE 2.5-0.025 MG PO TABS
2.0000 | ORAL_TABLET | Freq: Three times a day (TID) | ORAL | 6 refills | Status: DC | PRN
Start: 2022-10-19 — End: 2022-11-13

## 2022-10-19 NOTE — Progress Notes (Unsigned)
Palliative Medicine [facility]  [phone]) [phone] Fax:([phone]) [phone]   Name: Jesse [name] Date: 10/19/2022 MRN: [id number]  DOB: 1950-09-03  Patient Care Team: [name], MD as PCP - General (Family Medicine) Si Gaul, MD as Consulting Physician (Oncology) Verner Chol, [facility] (Inactive) as Pharmacist (Pharmacist) Iva Boop, MD as Consulting Physician (Gastroenterology) Josephine Igo, DO as Consulting Physician (Pulmonary Disease) Berneice Heinrich Delbert Phenix., MD as Consulting Physician (Urology) Karie Soda, MD as Consulting Physician (General Surgery) Pricilla Riffle, MD as Consulting Physician (Cardiology)    INTERVAL HISTORY: Jesse [name] is a 72 y.o. 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 Previous medical history also includes hypertension, hyperlipidemia, GERD, anemia, COPD, and macular degeneration and a stroke during most recent hospitalization. Surgical history includes ileostomy and takedown. Peg tube placed (07/04/2022) for esophageal stricture/dysphagia. Palliative asked to see for symptom management and goals of care.   SOCIAL HISTORY:     reports that he quit smoking about 3 years ago. His smoking use included cigarettes. He has a 20.00 pack-year smoking history. He has never used smokeless tobacco. He reports that he does not currently use alcohol after a past usage of about 15.0 standard drinks of alcohol per week. He reports that he does not currently use drugs after having used the following drugs: Marijuana.  ADVANCE DIRECTIVES:  Advanced directives on file  CODE STATUS: Full code  PAST MEDICAL HISTORY: Past Medical History:  Diagnosis Date   Anemia    Cancer (HCC)    bladder cancer   COPD (chronic obstructive pulmonary disease) (HCC)    mild    DIVERTICULOSIS, COLON  01/14/2007   Qualifier: Diagnosis of  By: Marcelyn Ditty RN, Katy Fitch    GASTRITIS, CHRONIC 06/01/2004   Qualifier: Diagnosis of  By: Creta Levin CMA (AAMA), Robin     GERD 01/09/2007   Qualifier: Diagnosis of  By: Tawanna Cooler, RN, Ellen     Heart murmur    as a child; no murmur heard 12/14/19   History of radiation therapy    Mediastinum- 05/15/22-06/28/22-Dr. Antony Blackbird   HYPERLIPIDEMIA 01/09/2007   Qualifier: Diagnosis of  By: Everett Graff     Hypertension    Lung mass    left upper nodule   Macular degeneration    right eye   NEOPLASM, MALIGNANT, BLADDER, HX OF 2002   Qualifier: Diagnosis of  By: Creta Levin CMA (AAMA), Robin  / no chemo or radiation   OSTEOARTHRITIS 01/14/2007   Qualifier: Diagnosis of  By: Marcelyn Ditty, RN, Katy Fitch - knees   Rectal mass    Reflux esophagitis 06/01/2004   Qualifier: Diagnosis of  By: Creta Levin CMA (AAMA), Robin     Stroke [facility])    TOBACCO USER 02/16/2009   Qualifier: Diagnosis of  By: Amador Cunas  MD, Janett Labella    VITAMIN D DEFICIENCY 06/03/2007   Qualifier: Diagnosis of  By: Amador Cunas  MD, Janett Labella     ALLERGIES:  has No Known Allergies.  MEDICATIONS:  Current Outpatient Medications  Medication Sig Dispense Refill   acetaminophen (TYLENOL) 500 MG tablet Take 1,000 mg by mouth every 8 (eight) hours as needed for moderate pain.     amLODipine (NORVASC) 5 MG tablet Take 1 tablet (5 mg total) by mouth daily. 90 tablet 3   aspirin 81 MG chewable tablet Chew 1 tablet (81 mg  total) by mouth daily.     atorvastatin (LIPITOR) 40 MG tablet Take 1/2 tablet (20 mg total) by mouth daily. 90 tablet 3   Cholecalciferol (VITAMIN D3 PO) Take 2,000 Units by mouth daily.     cholestyramine (QUESTRAN) 4 GM/DOSE powder Take 4 g by mouth 2 (two) times daily.     diphenoxylate-atropine (LOMOTIL) 2.5-0.025 MG tablet Take 2 tablets by mouth 3 (three) times daily as needed for diarrhea or loose stools. 90 tablet 6   ferrous sulfate 325 (65 FE) MG EC tablet Take 1 tablet (325 mg  total) by mouth 2 (two) times daily. 60 tablet 1   imatinib (GLEEVEC) 400 MG tablet Take 1 tablet (400 mg total) by mouth daily. Take with meals and large glass of water.Caution:Chemotherapy. (Patient taking differently: Take 400 mg by mouth at bedtime. Take with meals and large glass of water.Caution:Chemotherapy.) 30 tablet 3   lidocaine (XYLOCAINE) 2 % solution Use as directed 15 mLs in the mouth or throat 4 (four) times daily -  before meals and at bedtime. 420 mL 1   lidocaine (XYLOCAINE) 5 % ointment Apply 1 Application topically 3 (three) times daily as needed for mild pain or moderate pain. 35.44 g 3   loperamide (IMODIUM A-D) 2 MG tablet Take 4 mg by mouth 4 (four) times daily as needed for diarrhea or loose stools.     mirtazapine (REMERON) 30 MG tablet Take 30 mg by mouth at bedtime.     Multiple Vitamins-Minerals (PRESERVISION AREDS 2) CAPS Take 1 capsule by mouth 2 (two) times daily.     Nutritional Supplements (FEEDING SUPPLEMENT, JEVITY 1.5 CAL/FIBER,) LIQD Change TF to 1 carton Osmolite 1.5 + 1 carton Jevity 1.5 with 60 mL free water before and after feedings TID via PEG. 711 mL ML   Nutritional Supplements (FEEDING SUPPLEMENT, OSMOLITE 1.5 CAL,) LIQD Change TF to 1 carton Osmolite 1.5 + 1 carton Jevity 1.5 with 60 mL free water before and after feedings TID via PEG. 711 mL 12   pantoprazole (PROTONIX) 40 MG tablet Take 1 tablet (40 mg total) by mouth 2 (two) times daily. 60 tablet 3   sucralfate (CARAFATE) 1 g tablet Take 1 tablet by mouth 4 times daily -  with meals and at bedtime. (Patient taking differently: Take 1 g by mouth 2 (two) times daily.) 120 tablet 11   No current facility-administered medications for this visit.    VITAL SIGNS: There were no vitals taken for this visit. There were no vitals filed for this visit.  Estimated body mass index is 20.85 kg/m as calculated from the following:   Height as of 10/08/22: 5\' 9"  (1.753 m).   Weight as of 10/08/22: 141 lb 3.2 oz  (64 kg).   PERFORMANCE STATUS (ECOG) : 1 - Symptomatic but completely ambulatory   Physical Exam General: NAD, some discomfort sitting, has donut cushion  Cardiovascular: Tachycardic (113) Pulmonary: normal breathing pattern  Abdomen: soft, nontender, + bowel sounds, PEG in place Extremities: no edema, no joint deformities Skin: no rashes Neurological: AAO x4  IMPRESSION:   1.  Diarrhea/Dyspepsia  Goals of care   4/9- Mr. Schwind wishes to be a FULL CODE and pursue treatments that can improve his level of health and functioning. He has HCPOA listing his wife as Management consultant, and Living Will documents on file. Patient and wife interested in MOST form to help consolidate wishes onto one form that could be honored by medical providers/EMS in the event of an emergency. They are taking form home for review and to decide if they would like to complete at next visit.  We discussed Her current illness and what it means in the larger context of Her on-going co-morbidities. Natural disease trajectory and expectations were discussed.  I discussed the importance of continued conversation with family and their medical providers regarding overall plan of care and treatment options, ensuring decisions are within the context of the patients values and GOCs.  PLAN:   Compazine 10 mg PO/per tube every 6 hours as needed for nausea. Continue nightly Remeron. Monitor sleep pattern in setting of changes to diet, bowel pattern, and adjustment to home environment. Continue Tylenol 1,000 mg every 8 hours as needed for pain. Will monitor pain closely. Patient and wife aware to call for changes/needs. Lidocaine ointment to sacral area as needed Carafate twice daily Lomotil as needed up to four times daily. Education provided on safe use and prevention of constipation.  Protonix twice daily  Palliative will plan to see patient back in 2-4 weeks in collaboration to other oncology appointments.   Patient expressed understanding and was in agreement with this plan. He also understands that He can call the clinic at any time with any questions, concerns, or complaints.    Visit consisted of counseling and education dealing with the complex and  emotionally intense issues of symptom management and palliative care in the setting of serious and potentially life-threatening illness.Greater than 50%  of this time was spent counseling and coordinating care related to the above assessment and plan.  Willette Alma, AGPCNP-BC  Palliative Medicine Team/[facility]  *Please note that this is a verbal dictation therefore any spelling or grammatical errors are due to the "[employer]" system interpretation.

## 2022-10-19 NOTE — Telephone Encounter (Signed)
Pt SO called for lomotil refill, see new orders.

## 2022-10-22 ENCOUNTER — Ambulatory Visit (HOSPITAL_COMMUNITY)
Admission: RE | Admit: 2022-10-22 | Discharge: 2022-10-22 | Disposition: A | Discharge status: Home / Self Care | Payer: Medicare Other | Source: Ambulatory Visit | Attending: Internal Medicine | Admitting: Internal Medicine

## 2022-10-22 ENCOUNTER — Encounter: Payer: Self-pay | Admitting: Nurse Practitioner

## 2022-10-22 ENCOUNTER — Inpatient Hospital Stay (HOSPITAL_BASED_OUTPATIENT_CLINIC_OR_DEPARTMENT_OTHER): Payer: Medicare Other | Admitting: Nurse Practitioner

## 2022-10-22 ENCOUNTER — Other Ambulatory Visit: Payer: Self-pay

## 2022-10-22 ENCOUNTER — Inpatient Hospital Stay: Payer: Medicare Other | Attending: Internal Medicine

## 2022-10-22 VITALS — BP 124/69 | HR 108 | Temp 97.7°F | Resp 18

## 2022-10-22 DIAGNOSIS — C3490 Malignant neoplasm of unspecified part of unspecified bronchus or lung: Secondary | ICD-10-CM

## 2022-10-22 DIAGNOSIS — I10 Essential (primary) hypertension: Secondary | ICD-10-CM | POA: Insufficient documentation

## 2022-10-22 DIAGNOSIS — E785 Hyperlipidemia, unspecified: Secondary | ICD-10-CM | POA: Insufficient documentation

## 2022-10-22 DIAGNOSIS — I77810 Thoracic aortic ectasia: Secondary | ICD-10-CM | POA: Diagnosis not present

## 2022-10-22 DIAGNOSIS — I700 Atherosclerosis of aorta: Secondary | ICD-10-CM | POA: Diagnosis not present

## 2022-10-22 DIAGNOSIS — R530 Neoplastic (malignant) related fatigue: Secondary | ICD-10-CM

## 2022-10-22 DIAGNOSIS — I4719 Other supraventricular tachycardia: Secondary | ICD-10-CM | POA: Diagnosis not present

## 2022-10-22 DIAGNOSIS — R001 Bradycardia, unspecified: Secondary | ICD-10-CM | POA: Diagnosis not present

## 2022-10-22 DIAGNOSIS — C3412 Malignant neoplasm of upper lobe, left bronchus or lung: Secondary | ICD-10-CM | POA: Insufficient documentation

## 2022-10-22 DIAGNOSIS — C49A5 Gastrointestinal stromal tumor of rectum: Secondary | ICD-10-CM | POA: Diagnosis not present

## 2022-10-22 DIAGNOSIS — G893 Neoplasm related pain (acute) (chronic): Secondary | ICD-10-CM

## 2022-10-22 DIAGNOSIS — K591 Functional diarrhea: Secondary | ICD-10-CM | POA: Diagnosis not present

## 2022-10-22 DIAGNOSIS — Z8673 Personal history of transient ischemic attack (TIA), and cerebral infarction without residual deficits: Secondary | ICD-10-CM | POA: Insufficient documentation

## 2022-10-22 DIAGNOSIS — R197 Diarrhea, unspecified: Secondary | ICD-10-CM | POA: Insufficient documentation

## 2022-10-22 DIAGNOSIS — R634 Abnormal weight loss: Secondary | ICD-10-CM | POA: Diagnosis not present

## 2022-10-22 DIAGNOSIS — Z923 Personal history of irradiation: Secondary | ICD-10-CM | POA: Insufficient documentation

## 2022-10-22 DIAGNOSIS — J90 Pleural effusion, not elsewhere classified: Secondary | ICD-10-CM | POA: Diagnosis not present

## 2022-10-22 DIAGNOSIS — J449 Chronic obstructive pulmonary disease, unspecified: Secondary | ICD-10-CM | POA: Insufficient documentation

## 2022-10-22 DIAGNOSIS — Z9089 Acquired absence of other organs: Secondary | ICD-10-CM | POA: Insufficient documentation

## 2022-10-22 DIAGNOSIS — Z9221 Personal history of antineoplastic chemotherapy: Secondary | ICD-10-CM | POA: Insufficient documentation

## 2022-10-22 DIAGNOSIS — I351 Nonrheumatic aortic (valve) insufficiency: Secondary | ICD-10-CM | POA: Diagnosis not present

## 2022-10-22 DIAGNOSIS — Z79899 Other long term (current) drug therapy: Secondary | ICD-10-CM | POA: Insufficient documentation

## 2022-10-22 DIAGNOSIS — E782 Mixed hyperlipidemia: Secondary | ICD-10-CM | POA: Diagnosis not present

## 2022-10-22 DIAGNOSIS — Z515 Encounter for palliative care: Secondary | ICD-10-CM

## 2022-10-22 DIAGNOSIS — I493 Ventricular premature depolarization: Secondary | ICD-10-CM | POA: Diagnosis not present

## 2022-10-22 DIAGNOSIS — Z950 Presence of cardiac pacemaker: Secondary | ICD-10-CM | POA: Diagnosis not present

## 2022-10-22 DIAGNOSIS — K219 Gastro-esophageal reflux disease without esophagitis: Secondary | ICD-10-CM | POA: Insufficient documentation

## 2022-10-22 DIAGNOSIS — Z902 Acquired absence of lung [part of]: Secondary | ICD-10-CM | POA: Insufficient documentation

## 2022-10-22 DIAGNOSIS — C771 Secondary and unspecified malignant neoplasm of intrathoracic lymph nodes: Secondary | ICD-10-CM | POA: Insufficient documentation

## 2022-10-22 DIAGNOSIS — Z7982 Long term (current) use of aspirin: Secondary | ICD-10-CM | POA: Insufficient documentation

## 2022-10-22 LAB — CMP (CANCER CENTER ONLY)
ALT: 28 U/L (ref 0–44)
AST: 26 U/L (ref 15–41)
Albumin: 3.6 g/dL (ref 3.5–5.0)
Alkaline Phosphatase: 114 U/L (ref 38–126)
Anion gap: 6 (ref 5–15)
BUN: 24 mg/dL — ABNORMAL HIGH (ref 8–23)
CO2: 30 mmol/L (ref 22–32)
Calcium: 9.5 mg/dL (ref 8.9–10.3)
Chloride: 101 mmol/L (ref 98–111)
Creatinine: 0.8 mg/dL (ref 0.61–1.24)
GFR, Estimated: >60 mL/min (ref >60)
Glucose, Bld: 115 mg/dL — ABNORMAL HIGH (ref 70–99)
Potassium: 4.1 mmol/L (ref 3.5–5.1)
Sodium: 137 mmol/L (ref 135–145)
Total Bilirubin: 0.4 mg/dL (ref 0.3–1.2)
Total Protein: 7.3 g/dL (ref 6.5–8.1)

## 2022-10-22 LAB — CBC WITH DIFFERENTIAL (CANCER CENTER ONLY)
Abs Immature Granulocytes: 0.02 10*3/uL (ref 0.00–0.07)
Basophils Absolute: 0 10*3/uL (ref 0.0–0.1)
Basophils Relative: 0 %
Eosinophils Absolute: 0.1 10*3/uL (ref 0.0–0.5)
Eosinophils Relative: 3 %
HCT: 34.3 % — ABNORMAL LOW (ref 39.0–52.0)
Hemoglobin: 10.9 g/dL — ABNORMAL LOW (ref 13.0–17.0)
Immature Granulocytes: 0 %
Lymphocytes Relative: 9 %
Lymphs Abs: 0.5 10*3/uL — ABNORMAL LOW (ref 0.7–4.0)
MCH: 29.5 pg (ref 26.0–34.0)
MCHC: 31.8 g/dL (ref 30.0–36.0)
MCV: 92.7 fL (ref 80.0–100.0)
Monocytes Absolute: 0.3 10*3/uL (ref 0.1–1.0)
Monocytes Relative: 6 %
Neutro Abs: 4.3 10*3/uL (ref 1.7–7.7)
Neutrophils Relative %: 82 %
Platelet Count: 198 10*3/uL (ref 150–400)
RBC: 3.7 MIL/uL — ABNORMAL LOW (ref 4.22–5.81)
RDW: 17.4 % — ABNORMAL HIGH (ref 11.5–15.5)
WBC Count: 5.3 10*3/uL (ref 4.0–10.5)
nRBC: 0 % (ref 0.0–0.2)

## 2022-10-22 MED ORDER — IOHEXOL 300 MG/ML  SOLN
75.0000 mL | Freq: Once | INTRAMUSCULAR | Status: AC | PRN
  Administered 2022-10-22: 75 mL via INTRAVENOUS

## 2022-10-24 ENCOUNTER — Ambulatory Visit: Payer: Medicare Other | Admitting: Internal Medicine

## 2022-10-24 ENCOUNTER — Other Ambulatory Visit: Payer: Self-pay

## 2022-10-24 ENCOUNTER — Inpatient Hospital Stay: Payer: Medicare Other | Admitting: Internal Medicine

## 2022-10-24 VITALS — BP 124/78 | HR 103 | Temp 97.4°F | Resp 17 | Ht 69.0 in | Wt 142.7 lb

## 2022-10-24 DIAGNOSIS — J449 Chronic obstructive pulmonary disease, unspecified: Secondary | ICD-10-CM | POA: Diagnosis not present

## 2022-10-24 DIAGNOSIS — C771 Secondary and unspecified malignant neoplasm of intrathoracic lymph nodes: Secondary | ICD-10-CM | POA: Diagnosis not present

## 2022-10-24 DIAGNOSIS — E785 Hyperlipidemia, unspecified: Secondary | ICD-10-CM | POA: Diagnosis not present

## 2022-10-24 DIAGNOSIS — I10 Essential (primary) hypertension: Secondary | ICD-10-CM | POA: Diagnosis not present

## 2022-10-24 DIAGNOSIS — R197 Diarrhea, unspecified: Secondary | ICD-10-CM | POA: Diagnosis not present

## 2022-10-24 DIAGNOSIS — Z7982 Long term (current) use of aspirin: Secondary | ICD-10-CM | POA: Diagnosis not present

## 2022-10-24 DIAGNOSIS — Z902 Acquired absence of lung [part of]: Secondary | ICD-10-CM | POA: Diagnosis not present

## 2022-10-24 DIAGNOSIS — Z9089 Acquired absence of other organs: Secondary | ICD-10-CM | POA: Diagnosis not present

## 2022-10-24 DIAGNOSIS — Z9221 Personal history of antineoplastic chemotherapy: Secondary | ICD-10-CM | POA: Diagnosis not present

## 2022-10-24 DIAGNOSIS — C3490 Malignant neoplasm of unspecified part of unspecified bronchus or lung: Secondary | ICD-10-CM | POA: Diagnosis not present

## 2022-10-24 DIAGNOSIS — Z8673 Personal history of transient ischemic attack (TIA), and cerebral infarction without residual deficits: Secondary | ICD-10-CM | POA: Diagnosis not present

## 2022-10-24 DIAGNOSIS — K219 Gastro-esophageal reflux disease without esophagitis: Secondary | ICD-10-CM | POA: Diagnosis not present

## 2022-10-24 DIAGNOSIS — C3412 Malignant neoplasm of upper lobe, left bronchus or lung: Secondary | ICD-10-CM | POA: Diagnosis not present

## 2022-10-24 DIAGNOSIS — Z79899 Other long term (current) drug therapy: Secondary | ICD-10-CM | POA: Diagnosis not present

## 2022-10-24 DIAGNOSIS — Z923 Personal history of irradiation: Secondary | ICD-10-CM | POA: Diagnosis not present

## 2022-10-24 MED ORDER — DOXYCYCLINE HYCLATE 100 MG PO TABS: 100.0000 mg | ORAL_TABLET | Freq: Two times a day (BID) | ORAL | 0 refills | Status: DC

## 2022-10-24 NOTE — Progress Notes (Signed)
[facility] [phone]   [phone]  OFFICE PROGRESS NOTE  [name], MD [address] [place] [address]  DIAGNOSIS:  1) recurrent non-small cell lung cancer, adenocarcinoma with positive mediastinal lymph node in December 2023 that was initially diagnosed as stage IIB (T1 a, N1, M0) non-small cell lung cancer, adenocarcinoma diagnosed in June 2021. 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  Biomarker Findings Tumor Mutational Burden - 20 Muts/Mb Microsatellite status - MS-Stable Genomic Findings For a complete list of the genes assayed, please refer to the Appendix. KEAP1 I469fs*17 DNMT3A E817* RB1 splice site 1960+5G>C TP53 R209* 8 Disease relevant genes with no reportable alterations: ALK, BRAF, EGFR, ERBB2, KRAS, MET, RET, ROS1  PDL1 Expression: 1 %  PRIOR THERAPY:  1) status post left upper lobectomy with lymph node dissection on December 16, 2019 under the care of Dr. Cliffton Asters.  The tumor size measured 0.9 cm but there was involvement of the level 10 L and 11 L.  2) Adjuvant systemic chemotherapy with cisplatin 75 mg/M2 and Alimta 500 mg/M2 every 3 weeks.  First dose February 04, 2020.  Status post 4 cycles. 3) Neoadjuvant treatment with imatinib 400 mg p.o. daily for the GIST tumor of the pelvic area.  First dose started July 18, 2020.  Status post 12 months of treatment. 4) status post robotic assisted very low anterior rectosigmoid resection with coloanal anastomosis, diverting loop ileostomy and wedge liver biopsy under the care of Dr. Michaell Cowing on December 14, 2021 and it showed minimal residual gastrointestinal stromal tumor. 5) concurrent chemoradiation with weekly carboplatin for AUC of 2 and paclitaxel 45 Mg/M2.  First dose May 14, 2022.  CURRENT THERAPY: Imatinib 400 mg p.o. daily  INTERVAL HISTORY: Jesse [name] 72 y.o. male returns to the clinic today for follow-up visit accompanied by his wife.   The patient continues to complain of increasing fatigue and weakness as well as frequent diarrhea.  He was seen by gastroenterology and had colonoscopy that was unremarkable.  He denied having any current chest pain but has shortness of breath with exertion with mild cough and no hemoptysis.  He has no nausea, vomiting, abdominal pain or constipation.  He has no headache or visual changes.  He denied having any recent weight loss or night sweats.  He eats only 1 and half meal a day but he continues to have additional nutrition via the PEG tube.  He is followed by the dietitian at the cancer center. He is here today for evaluation with repeat CT scan of the chest for restaging of his disease.    MEDICAL HISTORY: Past Medical History:  Diagnosis Date   Anemia    Cancer (HCC)    bladder cancer   COPD (chronic obstructive pulmonary disease) (HCC)    mild    DIVERTICULOSIS, COLON 01/14/2007   Qualifier: Diagnosis of  By: Marcelyn Ditty RN, Katy Fitch    GASTRITIS, CHRONIC 06/01/2004   Qualifier: Diagnosis of  By: Creta Levin CMA (AAMA), Robin     GERD 01/09/2007   Qualifier: Diagnosis of  By: Tawanna Cooler RN, Ellen     Heart murmur    as a child; no murmur heard 12/14/19   History of radiation therapy    Mediastinum- 05/15/22-06/28/22-Dr. Antony Blackbird   HYPERLIPIDEMIA 01/09/2007   Qualifier: Diagnosis of  By: Everett Graff     Hypertension    Lung mass    left upper nodule   Macular degeneration  right eye   NEOPLASM, MALIGNANT, BLADDER, HX OF 2002   Qualifier: Diagnosis of  By: Creta Levin CMA (AAMA), Robin  / no chemo or radiation   OSTEOARTHRITIS 01/14/2007   Qualifier: Diagnosis of  By: Marcelyn Ditty, RN, Katy Fitch - knees   Rectal mass    Reflux esophagitis 06/01/2004   Qualifier: Diagnosis of  By: Creta Levin CMA (AAMA), Robin     Stroke [facility])    TOBACCO USER 02/16/2009   Qualifier: Diagnosis of  By: Amador Cunas  MD, Janett Labella    VITAMIN D DEFICIENCY 06/03/2007   Qualifier: Diagnosis of  By: Amador Cunas   MD, Janett Labella     ALLERGIES:  has No Known Allergies.  MEDICATIONS:  Current Outpatient Medications  Medication Sig Dispense Refill   acetaminophen (TYLENOL) 500 MG tablet Take 1,000 mg by mouth every 8 (eight) hours as needed for moderate pain.     amLODipine (NORVASC) 5 MG tablet Take 1 tablet (5 mg total) by mouth daily. 90 tablet 3   aspirin 81 MG chewable tablet Chew 1 tablet (81 mg total) by mouth daily.     atorvastatin (LIPITOR) 40 MG tablet Take 1/2 tablet (20 mg total) by mouth daily. 90 tablet 3   Cholecalciferol (VITAMIN D3 PO) Take 2,000 Units by mouth daily.     cholestyramine (QUESTRAN) 4 GM/DOSE powder Take 4 g by mouth 2 (two) times daily.     diphenoxylate-atropine (LOMOTIL) 2.5-0.025 MG tablet Take 2 tablets by mouth 3 (three) times daily as needed for diarrhea or loose stools. 90 tablet 6   ferrous sulfate 325 (65 FE) MG EC tablet Take 1 tablet (325 mg total) by mouth 2 (two) times daily. 60 tablet 1   imatinib (GLEEVEC) 400 MG tablet Take 1 tablet (400 mg total) by mouth daily. Take with meals and large glass of water.Caution:Chemotherapy. (Patient taking differently: Take 400 mg by mouth at bedtime. Take with meals and large glass of water.Caution:Chemotherapy.) 30 tablet 3   lidocaine (XYLOCAINE) 2 % solution Use as directed 15 mLs in the mouth or throat 4 (four) times daily -  before meals and at bedtime. 420 mL 1   lidocaine (XYLOCAINE) 5 % ointment Apply 1 Application topically 3 (three) times daily as needed for mild pain or moderate pain. 35.44 g 3   loperamide (IMODIUM A-D) 2 MG tablet Take 4 mg by mouth 4 (four) times daily as needed for diarrhea or loose stools.     mirtazapine (REMERON) 30 MG tablet Take 30 mg by mouth at bedtime.     Multiple Vitamins-Minerals (PRESERVISION AREDS 2) CAPS Take 1 capsule by mouth 2 (two) times daily.     Nutritional Supplements (FEEDING SUPPLEMENT, JEVITY 1.5 CAL/FIBER,) LIQD Change TF to 1 carton Osmolite 1.5 + 1 carton Jevity 1.5  with 60 mL free water before and after feedings TID via PEG. 711 mL ML   Nutritional Supplements (FEEDING SUPPLEMENT, OSMOLITE 1.5 CAL,) LIQD Change TF to 1 carton Osmolite 1.5 + 1 carton Jevity 1.5 with 60 mL free water before and after feedings TID via PEG. 711 mL 12   pantoprazole (PROTONIX) 40 MG tablet Take 1 tablet (40 mg total) by mouth 2 (two) times daily. 60 tablet 3   sucralfate (CARAFATE) 1 g tablet Take 1 tablet by mouth 4 times daily -  with meals and at bedtime. (Patient taking differently: Take 1 g by mouth 2 (two) times daily.) 120 tablet 11   No current facility-administered medications for this visit.  SURGICAL HISTORY:  Past Surgical History:  Procedure Laterality Date   BIOPSY  06/18/2022   Procedure: BIOPSY;  Surgeon: Shellia Cleverly, DO;  Location: [facility];  Service: Gastroenterology;;   BREAST BIOPSY Left 03/16/2022   [place] LT RADIOACTIVE SEED LOC 03/16/2022 [facility]   BRONCHIAL BIOPSY  10/13/2019   Procedure: BRONCHIAL BIOPSIES;  Surgeon: Josephine Igo, DO;  Location: [facility];  Service: Pulmonary;;   BRONCHIAL BRUSHINGS  10/13/2019   Procedure: BRONCHIAL BRUSHINGS;  Surgeon: Josephine Igo, DO;  Location: [facility];  Service: Pulmonary;;   BRONCHIAL NEEDLE ASPIRATION BIOPSY  10/13/2019   Procedure: BRONCHIAL NEEDLE ASPIRATION BIOPSIES;  Surgeon: Josephine Igo, DO;  [facility]: [facility];  Service: Pulmonary;;   BRONCHIAL NEEDLE ASPIRATION BIOPSY  04/11/2022   Procedure: BRONCHIAL NEEDLE ASPIRATION BIOPSIES;  Surgeon: Josephine Igo, DO;  Location: [facility];  Service: Cardiopulmonary;;   BRONCHIAL WASHINGS  10/13/2019   Procedure: BRONCHIAL WASHINGS;  Surgeon: Josephine Igo, DO;  [facility]: [facility];  Service: Pulmonary;;   COLONOSCOPY     multiple   DIVERTING ILEOSTOMY N/A 12/14/2021   Procedure: DIVERTING ILEOSTOMY;  Surgeon: Karie Soda, MD;  Location: [facility];  Service: General;  Laterality: N/A;   ENDOBRONCHIAL ULTRASOUND  Bilateral 04/11/2022   Procedure: ENDOBRONCHIAL ULTRASOUND;  Surgeon: Josephine Igo, DO;  Location: [facility];  Service: Cardiopulmonary;  Laterality: Bilateral;   ESOPHAGEAL DILATION  06/18/2022   Procedure: ESOPHAGEAL DILATION;  Surgeon: Shellia Cleverly, DO;  [facility]: [facility];  Service: Gastroenterology;;   ESOPHAGOGASTRODUODENOSCOPY (EGD) WITH PROPOFOL N/A 06/18/2022   Procedure: ESOPHAGOGASTRODUODENOSCOPY (EGD) WITH PROPOFOL;  Surgeon: Shellia Cleverly, DO;  [facility]: [facility];  Service: Gastroenterology;  Laterality: N/A;  Severe dysphagia/odynophagia ?radiation esophagitis versus oral candidiasis   ILEOSTOMY CLOSURE N/A 03/22/2022   Procedure: OPEN TAKEDOWN OF LOOP ILEOSTOMY;  Surgeon: Karie Soda, MD;  Location: [facility];  Service: General;  Laterality: N/A;  GEN w/ERAS PATHWAY LOCAL   INTERCOSTAL NERVE BLOCK Left 12/16/2019   Procedure: INTERCOSTAL NERVE BLOCK;  Surgeon: Corliss Skains, MD;  Location: [facility];  Service: Thoracic;  Laterality: Left;   IR FLUORO RM 30-60 MIN  07/02/2022   KNEE SURGERY  07/20/2008   bilat.   NASAL SEPTUM SURGERY  1995   NODE DISSECTION Left 12/16/2019   Procedure: NODE DISSECTION;  Surgeon: Corliss Skains, MD;  Location: [facility];  Service: Thoracic;  Laterality: Left;   PEG PLACEMENT N/A 07/04/2022   Procedure: PERCUTANEOUS ENDOSCOPIC GASTROSTOMY (PEG) PLACEMENT;  Surgeon: Iva Boop, MD;  Location: [facility];  Service: Gastroenterology;  Laterality: N/A;   RADIOACTIVE SEED GUIDED AXILLARY SENTINEL LYMPH NODE Left 03/20/2022   Procedure: RADIOACTIVE SEED GUIDED LEFT AXILLARY SENTINEL LYMPH NODE BIOPSY;  Surgeon: Harriette Bouillon, MD;  Location: [facility];  Service: General;  Laterality: Left;   RECTAL EXAM UNDER ANESTHESIA N/A 03/22/2022   Procedure: ANORECTAL EXAMINATION UNDER ANESTHESIA;  Surgeon: Karie Soda, MD;  Location: [facility];  Service: General;  Laterality: N/A;   TONSILLECTOMY     TRANSURETHRAL RESECTION OF BLADDER TUMOR  2002    UPPER GASTROINTESTINAL ENDOSCOPY     VIDEO BRONCHOSCOPY  04/11/2022   Procedure: VIDEO BRONCHOSCOPY;  Surgeon: Josephine Igo, DO;  Location: [facility];  Service: Cardiopulmonary;;   VIDEO BRONCHOSCOPY WITH ENDOBRONCHIAL NAVIGATION N/A 10/13/2019   Procedure: VIDEO BRONCHOSCOPY WITH ENDOBRONCHIAL NAVIGATION;  Surgeon: Josephine Igo, DO;  [facility];  Service: Pulmonary;  Laterality: N/A;   WISDOM TOOTH EXTRACTION  XI ROBOTIC ASSISTED LOWER ANTERIOR RESECTION N/A 12/14/2021   Procedure: XI ROBOTIC ASSISTED LOWER ANTERIOR ULTRA LOW RECTOSIGMOID RESECTION, COLOANAL HAND SEWN ANASTOMOSIS, AND BILATERAL TAP BLOCK;  Surgeon: Karie Soda, MD;  Location: [facility];  Service: General;  Laterality: N/A;    REVIEW OF SYSTEMS:  Constitutional: positive for fatigue Eyes: negative Ears, nose, mouth, throat, and face: negative Respiratory: positive for dyspnea on exertion Cardiovascular: negative Gastrointestinal: positive for diarrhea Genitourinary:negative Integument/breast: negative Hematologic/lymphatic: negative Musculoskeletal:positive for muscle weakness Neurological: negative Behavioral/Psych: negative Endocrine: negative Allergic/Immunologic: negative   PHYSICAL EXAMINATION: General appearance: alert, cooperative, fatigued, and no distress Head: Normocephalic, without obvious abnormality, atraumatic Neck: no adenopathy, no JVD, supple, symmetrical, trachea midline, and thyroid not enlarged, symmetric, no tenderness/mass/nodules Lymph nodes: Cervical, supraclavicular, and axillary nodes normal. Resp: clear to auscultation bilaterally Back: symmetric, no curvature. ROM normal. No CVA tenderness. Cardio: regular rate and rhythm, S1, S2 normal, no murmur, click, rub or gallop GI: soft, non-tender; bowel sounds normal; no masses,  no organomegaly Extremities: extremities normal, atraumatic, no cyanosis or edema Neurologic: Alert and oriented X 3, normal strength and tone.  Normal symmetric reflexes. Normal coordination and gait  ECOG PERFORMANCE STATUS: 2 - Symptomatic, <50% confined to bed  Blood pressure 124/78, pulse (!) 103, temperature (!) 97.4 F (36.3 C), resp. rate 17, height 5\' 9"  (1.753 m), weight 142 lb 11.2 oz (64.7 kg), SpO2 98 %.  LABORATORY DATA: Lab Results  Component Value Date   WBC 5.3 10/22/2022   HGB 10.9 (L) 10/22/2022   HCT 34.3 (L) 10/22/2022   MCV 92.7 10/22/2022   PLT 198 10/22/2022      Chemistry      Component Value Date/Time   NA 137 10/22/2022 1000   K 4.1 10/22/2022 1000   CL 101 10/22/2022 1000   CO2 30 10/22/2022 1000   BUN 24 (H) 10/22/2022 1000   CREATININE 0.80 10/22/2022 1000      Component Value Date/Time   CALCIUM 9.5 10/22/2022 1000   ALKPHOS 114 10/22/2022 1000   AST 26 10/22/2022 1000   ALT 28 10/22/2022 1000   BILITOT 0.4 10/22/2022 1000       RADIOGRAPHIC STUDIES: CT Chest W Contrast  Result Date: 10/24/2022 CLINICAL DATA:  Non-small cell lung cancer.  * Tracking Code: BO * EXAM: CT CHEST WITH CONTRAST TECHNIQUE: Multidetector CT imaging of the chest was performed during intravenous contrast administration. RADIATION DOSE REDUCTION: This exam was performed according to the departmental dose-optimization program which includes automated exposure control, adjustment of the mA and/or kV according to patient size and/or use of iterative reconstruction technique. CONTRAST:  75mL OMNIPAQUE IOHEXOL 300 MG/ML  SOLN COMPARISON:  CT chest 07/23/2022 FINDINGS: Cardiovascular: Coronary artery calcification and aortic atherosclerotic calcification. Mediastinum/Nodes: No mediastinal lymphadenopathy. Indistinct tissue planes within the mediastinum related to radiation therapy. Lungs/Pleura: New perihilar post radiation change in the suprahilar LEFT lung. Post LEFT upper lobectomy. Small LEFT effusion is new from prior. LEFT perihilar linear pattern in the RIGHT lung is also new suggest radiation change. New branching  nodular pattern in the RIGHT lower lobe (image 86/17) is most suggestive pulmonary infection. Two larger nodules in the peripheral RIGHT lower lobe measure 11 mm each on image 110/7. These are increased in size from comparison exam and border a cystic lesion. Upper Abdomen: Limited view of the liver, kidneys, pancreas are unremarkable. Normal adrenal glands. Musculoskeletal: Aggressive IMPRESSION: 1. Interval increase in size 2 pulmonary nodules boarding a cystic lesion in the lateral aspect of  the RIGHT lower lobe. Findings concerning for lung cancer recurrence. Consider FDG PET scan for further evaluation. 2. New branching nodular pattern in the RIGHT lower lobe is most suggestive of pulmonary infection. Recommend clinical correlation for pneumonia. 3. New perihilar post radiation change LEFT and RIGHT lung is favored benign. 4. New LEFT pleural effusion. Electronically Signed   By: Genevive Bi M.D.   On: 10/24/2022 09:39    ASSESSMENT AND PLAN: This is a very pleasant 72 years old white male recently diagnosed with a stage IIb (T1 a, N1, M0) non-small cell lung cancer, adenocarcinoma in June 2021 status post left upper lobectomy with lymph node dissection on December 16, 2019 under the care of Dr. Cliffton Asters.  The patient has no actionable mutations and PD-L1 expression was 1%. He was also diagnosed with pelvic GIST tumor. The patient completed a course of adjuvant systemic chemotherapy with cisplatin 75 mg/M2 and Alimta 500 mg/M2 every 3 weeks status post 4 cycles.  He tolerated his treatment well except for fatigue and occasional nausea. For the pelvic GIST tumor, the patient completed neoadjuvant treatment with imatinib 400 mg p.o. daily in April 2023.  Status post 18 months.   The patient underwent surgical resection of the remaining gastrointestinal stromal tumor under the care of Dr. Michaell Cowing on December 14, 2021 with very minimal residual disease.  He resumed his treatment with imatinib and  tolerating it fairly well. Repeat CT scan of the chest for restaging of his lung cancer. There was increased density of soft tissue in the left hilar region concerning for possible developing lymphadenopathy or disease recurrence.  A PET scan was performed recently and that showed new hypermetabolic left axillary, mediastinal and left hilar adenopathy compatible with recurrent metastatic lung cancer no hypermetabolic metastatic disease in the neck, abdomen, pelvis or skeleton. He underwent excisional biopsy of the left axillary lymph node that was not conclusive for malignancy. The patient was seen by Dr. Tonia Brooms and repeat video bronchoscopy with EBUS on April 11, 2022 and biopsy from the station 7 lymph node showed recurrent adenocarcinoma. I had a lengthy discussion with the patient today about his current condition and treatment options. I recommended for the patient a course of concurrent chemoradiation with weekly carboplatin for AUC of 2 and paclitaxel 45 Mg/M2 for 6-7 weeks.  Status post 3 cycles of chemotherapy but he completed the course of radiation.  The patient was admitted to the hospital on June 07, 2022 with significant sepsis and Klebsiella pneumonia bacteremia.  The patient is feeling fine today with no concerning complaints except for the persistent shortness of breath as well as diarrhea.  He had a colonoscopy recently that was unremarkable. He had repeat CT scan of the chest performed on 10/22/2022 but the final report is still pending.  I personally and independently reviewed the scan images and I can see some airspace disease and nodularity in the lung especially in the left lung with small pleural effusion.  This finding are concerning for inflammatory process but disease recurrence could not be completely excluded.  I will wait for the final report for confirmation. If the final report is concerning for disease recurrence, I will order a PET scan for further evaluation of his  condition and I will see him back for follow-up visit in 3 weeks.  I will also treat him with 2 weeks course of doxycycline for the suspicious inflammatory process in his lung. For the GIST, the patient will continue on his treatment with imatinib  400 mg p.o. daily for now. The patient was advised to call immediately if he has any other concerning symptoms in the interval.  The patient voices understanding of current disease status and treatment options and is in agreement with the current care plan.  All questions were answered. The patient knows to call the clinic with any problems, questions or concerns. We can certainly see the patient much sooner if necessary. The total time spent in the appointment was 30 minutes.  Disclaimer: This note was dictated with voice recognition software. Similar sounding words can inadvertently be transcribed and may not be corrected upon review.

## 2022-10-25 ENCOUNTER — Other Ambulatory Visit: Payer: Self-pay

## 2022-10-25 MED ORDER — CHOLESTYRAMINE 4 GM/DOSE PO POWD: 4.0000 g | Freq: Two times a day (BID) | ORAL | 2 refills | Status: DC

## 2022-10-29 DIAGNOSIS — R1314 Dysphagia, pharyngoesophageal phase: Secondary | ICD-10-CM | POA: Diagnosis not present

## 2022-10-29 DIAGNOSIS — Z931 Gastrostomy status: Secondary | ICD-10-CM | POA: Diagnosis not present

## 2022-10-29 DIAGNOSIS — E43 Unspecified severe protein-calorie malnutrition: Secondary | ICD-10-CM | POA: Diagnosis not present

## 2022-10-29 DIAGNOSIS — G9340 Encephalopathy, unspecified: Secondary | ICD-10-CM | POA: Diagnosis not present

## 2022-10-30 ENCOUNTER — Telehealth: Payer: Self-pay | Admitting: Internal Medicine

## 2022-10-30 NOTE — Telephone Encounter (Signed)
I left his wife [name] a detailed message to call me back so I can get answers to Dr Marvell Fuller questions.

## 2022-10-30 NOTE — Telephone Encounter (Signed)
 Please advise Sir, thank you. 

## 2022-10-30 NOTE — Telephone Encounter (Signed)
Find out more - what do they need and where are we sending it ?  Looks like they are using [employer]

## 2022-10-30 NOTE — Telephone Encounter (Signed)
PT and wife called to get refill for cholestyramine. She was advised by pharmacy that they need a handwritten script and that they would rather have the tub than the individual packets. It should be sent to [facility]

## 2022-10-31 ENCOUNTER — Ambulatory Visit (INDEPENDENT_AMBULATORY_CARE_PROVIDER_SITE_OTHER): Payer: Medicare Other | Admitting: Family Medicine

## 2022-10-31 ENCOUNTER — Encounter (HOSPITAL_COMMUNITY)
Admission: RE | Admit: 2022-10-31 | Discharge: 2022-10-31 | Disposition: A | Discharge status: Home / Self Care | Payer: Medicare Other | Source: Ambulatory Visit | Attending: Internal Medicine | Admitting: Internal Medicine

## 2022-10-31 VITALS — BP 108/76 | HR 105 | Temp 97.7°F | Wt 140.8 lb

## 2022-10-31 DIAGNOSIS — C3490 Malignant neoplasm of unspecified part of unspecified bronchus or lung: Secondary | ICD-10-CM | POA: Insufficient documentation

## 2022-10-31 DIAGNOSIS — K591 Functional diarrhea: Secondary | ICD-10-CM | POA: Diagnosis not present

## 2022-10-31 DIAGNOSIS — C49A5 Gastrointestinal stromal tumor of rectum: Secondary | ICD-10-CM

## 2022-10-31 DIAGNOSIS — J90 Pleural effusion, not elsewhere classified: Secondary | ICD-10-CM | POA: Diagnosis not present

## 2022-10-31 DIAGNOSIS — I10 Essential (primary) hypertension: Secondary | ICD-10-CM

## 2022-10-31 DIAGNOSIS — Z931 Gastrostomy status: Secondary | ICD-10-CM

## 2022-10-31 DIAGNOSIS — R634 Abnormal weight loss: Secondary | ICD-10-CM

## 2022-10-31 LAB — GLUCOSE, CAPILLARY: Glucose-Capillary: 97 mg/dL (ref 70–99)

## 2022-10-31 MED ORDER — FLUDEOXYGLUCOSE F - 18 (FDG) INJECTION
6.0000 | Freq: Once | INTRAVENOUS | Status: AC | PRN
  Administered 2022-10-31: 7.01 via INTRAVENOUS

## 2022-10-31 MED ORDER — CHOLESTYRAMINE 4 GM/DOSE PO POWD: ORAL | 11 refills | Status: DC

## 2022-10-31 NOTE — Telephone Encounter (Signed)
I spoke with [name] (wife) and she said the packets are a waste of money. They got a tub of it in the past and she wants if in that form please. I confirmed it is [facility].

## 2022-10-31 NOTE — Telephone Encounter (Signed)
 Rx sent 

## 2022-10-31 NOTE — Progress Notes (Signed)
Established Patient Office Visit   Subjective  Patient ID: Jesse [name], male    DOB: 09/25/1950  Age: 72 y.o. MRN: [id number]  Chief Complaint  Patient presents with   Medical Management of Chronic Issues    BP cardio took him off meds , freq stool - has not changed, still going freq (20-60 times per day). Had a good night last night.   Pt accompanied by his wife.  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  Pt taken off bp meds by Cardiology.  Having difficulty gaining wt.  Does not really have an appetite.  Doing tube feeds.  Had  a colonoscopy which was largely negative.  No microscopic colitis noted on bxp.  Pt still dealing with diarrhea, 20-30 loose stools per day.  Had less loose stools in the last 2 days.  Tired banana flakes in the past with little to no improvement.  Using imodium prn.  Drinking plenty of water.  Has gummies for appetite that he is considering trying.  Started on abx yesterday for concern for lung infection.  CXR this wk with possible infection and b/l pleural effusions.  Sleeping slightly inclined in hospital bed.  Denies cough.  Has noticed a slight wheeze.  L lung nodule slightly increased in size.  Has PET scant later today.      Past Medical History:  Diagnosis Date   Anemia    Cancer (HCC)    bladder cancer   COPD (chronic obstructive pulmonary disease) (HCC)    mild    DIVERTICULOSIS, COLON 01/14/2007   Qualifier: Diagnosis of  By: Marcelyn Ditty RN, Katy Fitch    GASTRITIS, CHRONIC 06/01/2004   Qualifier: Diagnosis of  By: Creta Levin CMA (AAMA), Robin     GERD 01/09/2007   Qualifier: Diagnosis of  By: Tawanna Cooler, RN, Ellen     Heart murmur    as a child; no murmur heard 12/14/19   History of radiation therapy    Mediastinum- 05/15/22-06/28/22-Dr. Antony Blackbird   HYPERLIPIDEMIA  01/09/2007   Qualifier: Diagnosis of  By: Everett Graff     Hypertension    Lung mass    left upper nodule   Macular degeneration    right eye   NEOPLASM, MALIGNANT, BLADDER, HX OF 2002   Qualifier: Diagnosis of  By: Creta Levin CMA (AAMA), Robin  / no chemo or radiation   OSTEOARTHRITIS 01/14/2007   Qualifier: Diagnosis of  By: Marcelyn Ditty, RN, Katy Fitch - knees   Rectal mass    Reflux esophagitis 06/01/2004   Qualifier: Diagnosis of  By: Creta Levin CMA (AAMA), Robin     Stroke [facility])    TOBACCO USER 02/16/2009   Qualifier: Diagnosis of  By: Amador Cunas  MD, Janett Labella    VITAMIN D DEFICIENCY 06/03/2007   Qualifier: Diagnosis of  By: Amador Cunas  MD, Janett Labella    Past Surgical History:  Procedure Laterality Date   BIOPSY  06/18/2022   Procedure: BIOPSY;  Surgeon: Shellia Cleverly, DO;  [facility];  Service: Gastroenterology;;   BREAST BIOPSY Left 03/16/2022   [place] LT RADIOACTIVE SEED LOC 03/16/2022 [facility]   BRONCHIAL BIOPSY  10/13/2019   Procedure: BRONCHIAL BIOPSIES;  Surgeon: Josephine Igo, DO;  [facility];  Service: Pulmonary;;   BRONCHIAL BRUSHINGS  10/13/2019  Procedure: BRONCHIAL BRUSHINGS;  Surgeon: Josephine Igo, DO;  [facility];  Service: Pulmonary;;   BRONCHIAL NEEDLE ASPIRATION BIOPSY  10/13/2019   Procedure: BRONCHIAL NEEDLE ASPIRATION BIOPSIES;  Surgeon: Josephine Igo, DO;  [facility];  Service: Pulmonary;;   BRONCHIAL NEEDLE ASPIRATION BIOPSY  04/11/2022   Procedure: BRONCHIAL NEEDLE ASPIRATION BIOPSIES;  Surgeon: Josephine Igo, DO;  [facility];  Service: Cardiopulmonary;;   BRONCHIAL WASHINGS  10/13/2019   Procedure: BRONCHIAL WASHINGS;  Surgeon: Josephine Igo, DO;  [facility];  Service: Pulmonary;;   COLONOSCOPY     multiple   DIVERTING ILEOSTOMY N/A 12/14/2021   Procedure: DIVERTING ILEOSTOMY;  Surgeon: Karie Soda, MD;  [facility];  Service: General;  Laterality: N/A;    ENDOBRONCHIAL ULTRASOUND Bilateral 04/11/2022   Procedure: ENDOBRONCHIAL ULTRASOUND;  Surgeon: Josephine Igo, DO;  [facility];  Service: Cardiopulmonary;  Laterality: Bilateral;   ESOPHAGEAL DILATION  06/18/2022   Procedure: ESOPHAGEAL DILATION;  Surgeon: Shellia Cleverly, DO;  [facility];  Service: Gastroenterology;;   ESOPHAGOGASTRODUODENOSCOPY (EGD) WITH PROPOFOL N/A 06/18/2022   Procedure: ESOPHAGOGASTRODUODENOSCOPY (EGD) WITH PROPOFOL;  Surgeon: Shellia Cleverly, DO;  [facility];  Service: Gastroenterology;  Laterality: N/A;  Severe dysphagia/odynophagia ?radiation esophagitis versus oral candidiasis   ILEOSTOMY CLOSURE N/A 03/22/2022   Procedure: OPEN TAKEDOWN OF LOOP ILEOSTOMY;  Surgeon: Karie Soda, MD;  [facility];  Service: General;  Laterality: N/A;  GEN w/ERAS PATHWAY LOCAL   INTERCOSTAL NERVE BLOCK Left 12/16/2019   Procedure: INTERCOSTAL NERVE BLOCK;  Surgeon: Corliss Skains, MD;  [facility];  Service: Thoracic;  Laterality: Left;   IR FLUORO RM 30-60 MIN  07/02/2022   KNEE SURGERY  07/20/2008   bilat.   NASAL SEPTUM SURGERY  1995   NODE DISSECTION Left 12/16/2019   Procedure: NODE DISSECTION;  Surgeon: Corliss Skains, MD;  [facility];  Service: Thoracic;  Laterality: Left;   PEG PLACEMENT N/A 07/04/2022   Procedure: PERCUTANEOUS ENDOSCOPIC GASTROSTOMY (PEG) PLACEMENT;  Surgeon: Iva Boop, MD;  [facility];  Service: Gastroenterology;  Laterality: N/A;   RADIOACTIVE SEED GUIDED AXILLARY SENTINEL LYMPH NODE Left 03/20/2022   Procedure: RADIOACTIVE SEED GUIDED LEFT AXILLARY SENTINEL LYMPH NODE BIOPSY;  Surgeon: Harriette Bouillon, MD;  [facility];  Service: General;  Laterality: Left;   RECTAL EXAM UNDER ANESTHESIA N/A 03/22/2022   Procedure: ANORECTAL EXAMINATION UNDER ANESTHESIA;  Surgeon: Karie Soda, MD;  [facility];  Service: General;  Laterality: N/A;   TONSILLECTOMY     TRANSURETHRAL RESECTION  OF BLADDER TUMOR  2002   UPPER GASTROINTESTINAL ENDOSCOPY     VIDEO BRONCHOSCOPY  04/11/2022   Procedure: VIDEO BRONCHOSCOPY;  Surgeon: Josephine Igo, DO;  [facility];  Service: Cardiopulmonary;;   VIDEO BRONCHOSCOPY WITH ENDOBRONCHIAL NAVIGATION N/A 10/13/2019   Procedure: VIDEO BRONCHOSCOPY WITH ENDOBRONCHIAL NAVIGATION;  Surgeon: Josephine Igo, DO;  [facility];  Service: Pulmonary;  Laterality: N/A;   WISDOM TOOTH EXTRACTION     XI ROBOTIC ASSISTED LOWER ANTERIOR RESECTION N/A 12/14/2021   Procedure: XI ROBOTIC ASSISTED LOWER ANTERIOR ULTRA LOW RECTOSIGMOID RESECTION, COLOANAL HAND SEWN ANASTOMOSIS, AND BILATERAL TAP BLOCK;  Surgeon: Karie Soda, MD;  [facility];  Service: General;  Laterality: N/A;   Social History   Tobacco Use   Smoking status: Former    Current packs/day: 0.00    Average packs/day: 0.5 packs/day for 40.0 years (20.0 ttl pk-yrs)  Types: Cigarettes    Start date: 09/21/1979    Quit date: 09/21/2019    Years since quitting: 3.1   Smokeless tobacco: Never  Vaping Use   Vaping status: Never Used  Substance Use Topics   Alcohol use: Not Currently    Alcohol/week: 15.0 standard drinks of alcohol    Types: 15 Standard drinks or equivalent per week    Comment: wine/beer/martini   Drug use: Not Currently    Types: Marijuana    Comment: Smokes marijuana occasional, last use in 08/2019   Family History  Problem Relation Age of Onset   Dementia Mother    Diabetes Father    Mental illness Father    Peripheral vascular disease Brother    Colon cancer Neg Hx    Esophageal cancer Neg Hx    Stomach cancer Neg Hx    Rectal cancer Neg Hx    No Known Allergies    ROS Negative unless stated above    Objective:     BP 108/76 (BP Location: Right Arm, Patient Position: Sitting, Cuff Size: Normal)   Pulse (!) 105   Temp 97.7 F (36.5 C) (Oral)   Wt 140 lb 12.8 oz (63.9 kg)   SpO2 97%   BMI 20.79 kg/m  BP Readings from Last 3  Encounters:  10/31/22 108/76  10/24/22 124/78  10/22/22 124/69   Wt Readings from Last 3 Encounters:  10/31/22 140 lb 12.8 oz (63.9 kg)  10/24/22 142 lb 11.2 oz (64.7 kg)  10/08/22 141 lb 3.2 oz (64 kg)      Physical Exam Constitutional:      Appearance: Normal appearance.  HENT:     Head: Normocephalic and atraumatic.     Nose: Nose normal.     Mouth/Throat:     Mouth: Mucous membranes are moist.  Cardiovascular:     Rate and Rhythm: Normal rate and regular rhythm.     Heart sounds: Normal heart sounds.  Pulmonary:     Effort: Pulmonary effort is normal. No respiratory distress.     Breath sounds: Wheezing present. No rhonchi or rales.  Abdominal:     General: Bowel sounds are normal.     Palpations: Abdomen is soft.     Tenderness: There is no abdominal tenderness.  Skin:    General: Skin is warm and dry.     Comments: PEG tube in place L abd with scant thick yellowish d/c and mild erythema at L of tube.  Neurological:     Mental Status: He is alert and oriented to person, place, and time. Mental status is at baseline.    No results found for any visits on 10/31/22.    Assessment & Plan:  Functional diarrhea -mild improvement in the last few days. -cause likely multifactorial including tube feeds, h/o xrt, medications -consider banana flakes, imodium,  -continue f/u with GI   Weight loss -encourage snacks in addition to tube feeds as tolerated. 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 Cliffton Asters. -PET scan scheduled later today. -continue f/u with CT surg, Oncology, Pulmonology -followed by Palliative Care  Essential  hypertension -well controlled -taken off bp meds by Cardiology -continue to monitor.  Restart meds if  needed   S/P percutaneous endoscopic gastrostomy (PEG) tube placement (HCC) -placed 07/04/2022 due to esophageal stricture and dysphagia -stable.   Pleural effusion -SOB, no cough -recent CXR with concern for infection vs inflammatory process.  Must also consider dz recurrence. -complete abx course of doxycycline -continue f/u with Oncology and Pulm -PET scan scheduled for later today.   Return for f/u in 6-8 wks, sooner if needed.[name], MD

## 2022-11-01 ENCOUNTER — Encounter: Payer: Self-pay | Admitting: Family Medicine

## 2022-11-02 ENCOUNTER — Telehealth: Payer: Medicare Other | Admitting: Family Medicine

## 2022-11-02 ENCOUNTER — Encounter: Payer: Self-pay | Admitting: Family Medicine

## 2022-11-02 DIAGNOSIS — Z712 Person consulting for explanation of examination or test findings: Secondary | ICD-10-CM

## 2022-11-02 NOTE — Progress Notes (Signed)
Virtual Visit via Video Note  I connected with Jesse [name] on 11/02/22 at 11:30 AM EDT by a video enabled telemedicine application and verified that I am speaking with the correct person using two identifiers.  Location patient: home Location provider:work or home office Persons participating in the virtual visit: patient, provider  I discussed the limitations of evaluation and management by telemedicine and the availability of in person appointments. The patient expressed understanding and agreed to proceed. Chief Complaint  Patient presents with   Results    Just wants to go over results he sent through mychart.      HPI: Pt is a 72 yo male with sig pmh who presents to review results.  Pt states his insurance company, [employer] sent a test kit to him requesting a urine sample?.  Pt states he was later sent a letter stating his albumin-creatinine ratio was abnormal and to f/u with his dr.  Verdis Prime does not state the actual numerical value, just abnormal.   Blood work from 10/22/22 including renal fx were normal.  Pt has an appt with Dr. Shirline Frees next wk to review PET scan results.  ROS: See pertinent positives and negatives per HPI.  Past Medical History:  Diagnosis Date   Anemia    Cancer (HCC)    bladder cancer   COPD (chronic obstructive pulmonary disease) (HCC)    mild    DIVERTICULOSIS, COLON 01/14/2007   Qualifier: Diagnosis of  By: Marcelyn Ditty RN, Katy Fitch    GASTRITIS, CHRONIC 06/01/2004   Qualifier: Diagnosis of  By: Creta Levin CMA (AAMA), Robin     GERD 01/09/2007   Qualifier: Diagnosis of  By: Tawanna Cooler, RN, Ellen     Heart murmur    as a child; no murmur heard 12/14/19   History of radiation therapy    Mediastinum- 05/15/22-06/28/22-Dr. Antony Blackbird   HYPERLIPIDEMIA 01/09/2007   Qualifier: Diagnosis of  By: Tawanna Cooler RN, Alvino Chapel     Hypertension    Lung mass    left upper nodule   Macular degeneration    right eye   NEOPLASM, MALIGNANT, BLADDER, HX OF 2002   Qualifier:  Diagnosis of  By: Creta Levin CMA (AAMA), Robin  / no chemo or radiation   OSTEOARTHRITIS 01/14/2007   Qualifier: Diagnosis of  By: Marcelyn Ditty, RN, Katy Fitch - knees   Rectal mass    Reflux esophagitis 06/01/2004   Qualifier: Diagnosis of  By: Creta Levin CMA (AAMA), Robin     Stroke [facility])    TOBACCO USER 02/16/2009   Qualifier: Diagnosis of  By: Amador Cunas  MD, Janett Labella    VITAMIN D DEFICIENCY 06/03/2007   Qualifier: Diagnosis of  By: Amador Cunas  MD, Janett Labella     Past Surgical History:  Procedure Laterality Date   BIOPSY  06/18/2022   Procedure: BIOPSY;  Surgeon: Shellia Cleverly, DO;  [facility];  Service: Gastroenterology;;   BREAST BIOPSY Left 03/16/2022   [place] LT RADIOACTIVE SEED LOC 03/16/2022 [facility]   BRONCHIAL BIOPSY  10/13/2019   Procedure: BRONCHIAL BIOPSIES;  Surgeon: Josephine Igo, DO;  [facility];  Service: Pulmonary;;   BRONCHIAL BRUSHINGS  10/13/2019   Procedure: BRONCHIAL BRUSHINGS;  Surgeon: Josephine Igo, DO;  [facility];  Service: Pulmonary;;   BRONCHIAL NEEDLE ASPIRATION BIOPSY  10/13/2019   Procedure: BRONCHIAL NEEDLE ASPIRATION BIOPSIES;  Surgeon: Josephine Igo, DO;  [facility];  Service: Pulmonary;;   BRONCHIAL NEEDLE ASPIRATION BIOPSY  04/11/2022   Procedure: BRONCHIAL  NEEDLE ASPIRATION BIOPSIES;  Surgeon: Josephine Igo, DO;  [facility];  Service: Cardiopulmonary;;   BRONCHIAL WASHINGS  10/13/2019   Procedure: BRONCHIAL WASHINGS;  Surgeon: Josephine Igo, DO;  [facility];  Service: Pulmonary;;   COLONOSCOPY     multiple   DIVERTING ILEOSTOMY N/A 12/14/2021   Procedure: DIVERTING ILEOSTOMY;  Surgeon: Karie Soda, MD;  [facility];  Service: General;  Laterality: N/A;   ENDOBRONCHIAL ULTRASOUND Bilateral 04/11/2022   Procedure: ENDOBRONCHIAL ULTRASOUND;  Surgeon: Josephine Igo, DO;  [facility];  Service: Cardiopulmonary;  Laterality: Bilateral;   ESOPHAGEAL  DILATION  06/18/2022   Procedure: ESOPHAGEAL DILATION;  Surgeon: Shellia Cleverly, DO;  [facility];  Service: Gastroenterology;;   ESOPHAGOGASTRODUODENOSCOPY (EGD) WITH PROPOFOL N/A 06/18/2022   Procedure: ESOPHAGOGASTRODUODENOSCOPY (EGD) WITH PROPOFOL;  Surgeon: Shellia Cleverly, DO;  [facility];  Service: Gastroenterology;  Laterality: N/A;  Severe dysphagia/odynophagia ?radiation esophagitis versus oral candidiasis   ILEOSTOMY CLOSURE N/A 03/22/2022   Procedure: OPEN TAKEDOWN OF LOOP ILEOSTOMY;  Surgeon: Karie Soda, MD;  [facility];  Service: General;  Laterality: N/A;  GEN w/ERAS PATHWAY LOCAL   INTERCOSTAL NERVE BLOCK Left 12/16/2019   Procedure: INTERCOSTAL NERVE BLOCK;  Surgeon: Corliss Skains, MD;  [facility];  Service: Thoracic;  Laterality: Left;   IR FLUORO RM 30-60 MIN  07/02/2022   KNEE SURGERY  07/20/2008   bilat.   NASAL SEPTUM SURGERY  1995   NODE DISSECTION Left 12/16/2019   Procedure: NODE DISSECTION;  Surgeon: Corliss Skains, MD;  [facility];  Service: Thoracic;  Laterality: Left;   PEG PLACEMENT N/A 07/04/2022   Procedure: PERCUTANEOUS ENDOSCOPIC GASTROSTOMY (PEG) PLACEMENT;  Surgeon: Iva Boop, MD;  [facility];  Service: Gastroenterology;  Laterality: N/A;   RADIOACTIVE SEED GUIDED AXILLARY SENTINEL LYMPH NODE Left 03/20/2022   Procedure: RADIOACTIVE SEED GUIDED LEFT AXILLARY SENTINEL LYMPH NODE BIOPSY;  Surgeon: Harriette Bouillon, MD;  [facility];  Service: General;  Laterality: Left;   RECTAL EXAM UNDER ANESTHESIA N/A 03/22/2022   Procedure: ANORECTAL EXAMINATION UNDER ANESTHESIA;  Surgeon: Karie Soda, MD;  [facility];  Service: General;  Laterality: N/A;   TONSILLECTOMY     TRANSURETHRAL RESECTION OF BLADDER TUMOR  2002   UPPER GASTROINTESTINAL ENDOSCOPY     VIDEO BRONCHOSCOPY  04/11/2022   Procedure: VIDEO BRONCHOSCOPY;  Surgeon: Josephine Igo, DO;  [facility];  Service:  Cardiopulmonary;;   VIDEO BRONCHOSCOPY WITH ENDOBRONCHIAL NAVIGATION N/A 10/13/2019   Procedure: VIDEO BRONCHOSCOPY WITH ENDOBRONCHIAL NAVIGATION;  Surgeon: Josephine Igo, DO;  [facility];  Service: Pulmonary;  Laterality: N/A;   WISDOM TOOTH EXTRACTION     XI ROBOTIC ASSISTED LOWER ANTERIOR RESECTION N/A 12/14/2021   Procedure: XI ROBOTIC ASSISTED LOWER ANTERIOR ULTRA LOW RECTOSIGMOID RESECTION, COLOANAL HAND SEWN ANASTOMOSIS, AND BILATERAL TAP BLOCK;  Surgeon: Karie Soda, MD;  [facility];  Service: General;  Laterality: N/A;    Family History  Problem Relation Age of Onset   Dementia Mother    Diabetes Father    Mental illness Father    Peripheral vascular disease Brother    Colon cancer Neg Hx    Esophageal cancer Neg Hx    Stomach cancer Neg Hx    Rectal cancer Neg Hx      Current Outpatient Medications:    acetaminophen (TYLENOL) 500 MG tablet, Take 1,000 mg by mouth every 8 (eight) hours as needed for moderate pain.,  Disp: , Rfl:    aspirin 81 MG chewable tablet, Chew 1 tablet (81 mg total) by mouth daily., Disp: , Rfl:    atorvastatin (LIPITOR) 40 MG tablet, Take 1/2 tablet (20 mg total) by mouth daily., Disp: 90 tablet, Rfl: 3   Cholecalciferol (VITAMIN D3 PO), Take 2,000 Units by mouth daily., Disp: , Rfl:    cholestyramine (QUESTRAN) 4 GM/DOSE powder, 4 g po bid with meals, Disp: 378 g, Rfl: 11   diphenoxylate-atropine (LOMOTIL) 2.5-0.025 MG tablet, Take 2 tablets by mouth 3 (three) times daily as needed for diarrhea or loose stools., Disp: 90 tablet, Rfl: 6   doxycycline (VIBRA-TABS) 100 MG tablet, Take 1 tablet (100 mg total) by mouth 2 (two) times daily., Disp: 20 tablet, Rfl: 0   ferrous sulfate 325 (65 FE) MG EC tablet, Take 1 tablet (325 mg total) by mouth 2 (two) times daily., Disp: 60 tablet, Rfl: 1   imatinib (GLEEVEC) 400 MG tablet, Take 1 tablet (400 mg total) by mouth daily. Take with meals and large glass of water.Caution:Chemotherapy.  (Patient taking differently: Take 400 mg by mouth at bedtime. Take with meals and large glass of water.Caution:Chemotherapy.), Disp: 30 tablet, Rfl: 3   loperamide (IMODIUM A-D) 2 MG tablet, Take 4 mg by mouth 4 (four) times daily as needed for diarrhea or loose stools., Disp: , Rfl:    mirtazapine (REMERON) 30 MG tablet, Take 30 mg by mouth at bedtime., Disp: , Rfl:    Multiple Vitamins-Minerals (PRESERVISION AREDS 2) CAPS, Take 1 capsule by mouth 2 (two) times daily., Disp: , Rfl:    Nutritional Supplements (FEEDING SUPPLEMENT, JEVITY 1.5 CAL/FIBER,) LIQD, Change TF to 1 carton Osmolite 1.5 + 1 carton Jevity 1.5 with 60 mL free water before and after feedings TID via PEG., Disp: 711 mL, Rfl: ML   Nutritional Supplements (FEEDING SUPPLEMENT, OSMOLITE 1.5 CAL,) LIQD, Change TF to 1 carton Osmolite 1.5 + 1 carton Jevity 1.5 with 60 mL free water before and after feedings TID via PEG., Disp: 711 mL, Rfl: 12   pantoprazole (PROTONIX) 40 MG tablet, Take 1 tablet (40 mg total) by mouth 2 (two) times daily., Disp: 60 tablet, Rfl: 3   sucralfate (CARAFATE) 1 g tablet, Take 1 tablet by mouth 4 times daily -  with meals and at bedtime. (Patient taking differently: Take 1 g by mouth 2 (two) times daily.), Disp: 120 tablet, Rfl: 11   amLODipine (NORVASC) 5 MG tablet, Take 1 tablet (5 mg total) by mouth daily. (Patient not taking: Reported on 10/31/2022), Disp: 90 tablet, Rfl: 3   lidocaine (XYLOCAINE) 2 % solution, Use as directed 15 mLs in the mouth or throat 4 (four) times daily -  before meals and at bedtime. (Patient not taking: Reported on 10/31/2022), Disp: 420 mL, Rfl: 1   lidocaine (XYLOCAINE) 5 % ointment, Apply 1 Application topically 3 (three) times daily as needed for mild pain or moderate pain. (Patient not taking: Reported on 10/31/2022), Disp: 35.44 g, Rfl: 3  EXAM:  VITALS per patient if applicable:  RR between 12-20 bpm  GENERAL: alert, oriented, appears well and in no acute distress  HEENT:  atraumatic, conjunctiva clear, no obvious abnormalities on inspection of external nose and ears  NECK: normal movements of the head and neck  LUNGS: on inspection no signs of respiratory distress, breathing rate appears normal, no obvious gross SOB, gasping or wheezing  CV: no obvious cyanosis  MS: moves all visible extremities without noticeable abnormality  PSYCH/NEURO: pleasant  and cooperative, no obvious depression or anxiety, speech and thought processing grossly intact  ASSESSMENT AND PLAN:  Discussed the following assessment and plan:  Encounter to discuss test results Urine albumin:creatinine ratio abnormal per insurance company testing, but no value noted.  Renal function great on 10/22/22 with creatinine .80, eGFR >60.  No current urinary symptoms.  Can have urine checked at an upcoming visit if desired.  F/u prn.   I discussed the assessment and treatment plan with the patient. The patient was provided an opportunity to ask questions and all were answered. The patient agreed with the plan and demonstrated an understanding of the instructions.   The patient was advised to call back or seek an in-person evaluation if the symptoms worsen or if the condition fails to improve as anticipated.   [name], MD

## 2022-11-07 ENCOUNTER — Encounter: Payer: Self-pay | Admitting: Family Medicine

## 2022-11-12 DIAGNOSIS — Z8673 Personal history of transient ischemic attack (TIA), and cerebral infarction without residual deficits: Secondary | ICD-10-CM | POA: Diagnosis not present

## 2022-11-12 DIAGNOSIS — M6281 Muscle weakness (generalized): Secondary | ICD-10-CM | POA: Diagnosis not present

## 2022-11-12 DIAGNOSIS — G9340 Encephalopathy, unspecified: Secondary | ICD-10-CM | POA: Diagnosis not present

## 2022-11-12 DIAGNOSIS — E43 Unspecified severe protein-calorie malnutrition: Secondary | ICD-10-CM | POA: Diagnosis not present

## 2022-11-12 DIAGNOSIS — R1314 Dysphagia, pharyngoesophageal phase: Secondary | ICD-10-CM | POA: Diagnosis not present

## 2022-11-12 NOTE — Progress Notes (Unsigned)
Palliative Medicine [facility]  [phone] [phone]   Name: Jesse [name] Date: 11/12/2022 MRN: [id number]  DOB: [birth year]  Patient Care Team: [name], MD as PCP - General (Family Medicine) Si Gaul, MD as Consulting Physician (Oncology) Verner Chol, [facility] (Inactive) as Pharmacist (Pharmacist) Iva Boop, MD as Consulting Physician (Gastroenterology) Josephine Igo, DO as Consulting Physician (Pulmonary Disease) Berneice Heinrich Delbert Phenix., MD as Consulting Physician (Urology) Karie Soda, MD as Consulting Physician (General Surgery) Pricilla Riffle, MD as Consulting Physician (Cardiology)    INTERVAL HISTORY: JOHNPATRICK [name] is a 72 y.o. 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 Previous medical history also includes hypertension, hyperlipidemia, GERD, anemia, COPD, and macular degeneration and a stroke during most recent hospitalization. Surgical history includes ileostomy and takedown. Peg tube placed (07/04/2022) for esophageal stricture/dysphagia. Palliative asked to see for symptom management and goals of care.   SOCIAL HISTORY:     reports that he quit smoking about 3 years ago. His smoking use included cigarettes. He started smoking about 43 years ago. He has a 20 pack-year smoking history. He has never used smokeless tobacco. He reports that he does not currently use alcohol after a past usage of about 15.0 standard drinks of alcohol per week. He reports that he does not currently use drugs after having used the following drugs: Marijuana.  ADVANCE DIRECTIVES:  Advanced directives on file  CODE STATUS: Full code  PAST MEDICAL HISTORY: Past Medical History:  Diagnosis Date   Anemia    Cancer (HCC)    bladder cancer   COPD (chronic obstructive pulmonary disease)  (HCC)    mild    DIVERTICULOSIS, COLON 01/14/2007   Qualifier: Diagnosis of  By: Marcelyn Ditty RN, Katy Fitch    GASTRITIS, CHRONIC 06/01/2004   Qualifier: Diagnosis of  By: Creta Levin CMA (AAMA), Robin     GERD 01/09/2007   Qualifier: Diagnosis of  By: Tawanna Cooler, RN, Ellen     Heart murmur    as a child; no murmur heard 12/14/19   History of radiation therapy    Mediastinum- 05/15/22-06/28/22-Dr. Antony Blackbird   HYPERLIPIDEMIA 01/09/2007   Qualifier: Diagnosis of  By: Everett Graff     Hypertension    Lung mass    left upper nodule   Macular degeneration    right eye   NEOPLASM, MALIGNANT, BLADDER, HX OF 2002   Qualifier: Diagnosis of  By: Creta Levin CMA (AAMA), Robin  / no chemo or radiation   OSTEOARTHRITIS 01/14/2007   Qualifier: Diagnosis of  By: Marcelyn Ditty, RN, Katy Fitch - knees   Rectal mass    Reflux esophagitis 06/01/2004   Qualifier: Diagnosis of  By: Creta Levin CMA (AAMA), Robin     Stroke [facility])    TOBACCO USER 02/16/2009   Qualifier: Diagnosis of  By: Amador Cunas  MD, Janett Labella    VITAMIN D DEFICIENCY 06/03/2007   Qualifier: Diagnosis of  By: Amador Cunas  MD, Janett Labella     ALLERGIES:  has No Known Allergies.  MEDICATIONS:  Current Outpatient Medications  Medication Sig Dispense Refill   acetaminophen (TYLENOL) 500 MG tablet Take 1,000 mg by mouth every 8 (eight) hours as needed for moderate pain.     amLODipine (NORVASC) 5 MG tablet Take 1 tablet (5 mg total) by mouth daily. (Patient not taking: Reported on 10/31/2022) 90 tablet  3   aspirin 81 MG chewable tablet Chew 1 tablet (81 mg total) by mouth daily.     atorvastatin (LIPITOR) 40 MG tablet Take 1/2 tablet (20 mg total) by mouth daily. 90 tablet 3   Cholecalciferol (VITAMIN D3 PO) Take 2,000 Units by mouth daily.     cholestyramine (QUESTRAN) 4 GM/DOSE powder 4 g po bid with meals 378 g 11   diphenoxylate-atropine (LOMOTIL) 2.5-0.025 MG tablet Take 2 tablets by mouth 3 (three) times daily as needed for diarrhea or loose stools. 90  tablet 6   doxycycline (VIBRA-TABS) 100 MG tablet Take 1 tablet (100 mg total) by mouth 2 (two) times daily. 20 tablet 0   ferrous sulfate 325 (65 FE) MG EC tablet Take 1 tablet (325 mg total) by mouth 2 (two) times daily. 60 tablet 1   imatinib (GLEEVEC) 400 MG tablet Take 1 tablet (400 mg total) by mouth daily. Take with meals and large glass of water.Caution:Chemotherapy. (Patient taking differently: Take 400 mg by mouth at bedtime. Take with meals and large glass of water.Caution:Chemotherapy.) 30 tablet 3   lidocaine (XYLOCAINE) 2 % solution Use as directed 15 mLs in the mouth or throat 4 (four) times daily -  before meals and at bedtime. (Patient not taking: Reported on 10/31/2022) 420 mL 1   lidocaine (XYLOCAINE) 5 % ointment Apply 1 Application topically 3 (three) times daily as needed for mild pain or moderate pain. (Patient not taking: Reported on 10/31/2022) 35.44 g 3   loperamide (IMODIUM A-D) 2 MG tablet Take 4 mg by mouth 4 (four) times daily as needed for diarrhea or loose stools.     mirtazapine (REMERON) 30 MG tablet Take 30 mg by mouth at bedtime.     Multiple Vitamins-Minerals (PRESERVISION AREDS 2) CAPS Take 1 capsule by mouth 2 (two) times daily.     Nutritional Supplements (FEEDING SUPPLEMENT, JEVITY 1.5 CAL/FIBER,) LIQD Change TF to 1 carton Osmolite 1.5 + 1 carton Jevity 1.5 with 60 mL free water before and after feedings TID via PEG. 711 mL ML   Nutritional Supplements (FEEDING SUPPLEMENT, OSMOLITE 1.5 CAL,) LIQD Change TF to 1 carton Osmolite 1.5 + 1 carton Jevity 1.5 with 60 mL free water before and after feedings TID via PEG. 711 mL 12   pantoprazole (PROTONIX) 40 MG tablet Take 1 tablet (40 mg total) by mouth 2 (two) times daily. 60 tablet 3   sucralfate (CARAFATE) 1 g tablet Take 1 tablet by mouth 4 times daily -  with meals and at bedtime. (Patient taking differently: Take 1 g by mouth 2 (two) times daily.) 120 tablet 11   No current facility-administered medications for  this visit.    VITAL SIGNS: There were no vitals taken for this visit. There were no vitals filed for this visit.  Estimated body mass index is 20.79 kg/m as calculated from the following:   Height as of 10/24/22: 5\' 9"  (1.753 m).   Weight as of 10/31/22: 140 lb 12.8 oz (63.9 kg).   PERFORMANCE STATUS (ECOG) : 1 - Symptomatic but completely ambulatory   Physical Exam General: NAD,  Pulmonary: normal breathing pattern  Abdomen: soft, nontender, + bowel sounds, PEG in place Extremities: no edema, no joint deformities Skin: no rashes Neurological: AAO x4  IMPRESSION: Mr. Kovacic presents to clinic today for follow-up. His wife is present. No acute distress. He was seen by Dr. Arbutus Ped today. Expresses appreciation of visit and updates. Denies nausea, vomiting, or constipation. Trying to be as  active as possible however remains limited due to fatigue and ongoing diarrhea. We reviewed current medications and schedule.   1.  Diarrhea/Dyspepsia  Ongoing challenges with loose stools and oozing. Patient and wife shares they are planning to discontinue use of Questran, Carafate, and hold Gleevec as instructed by Oncology. They are hopeful in some improvement in his bowel pattern. Some days are better than others. Is wearing adult briefs for support. Lomotil provides some relief. Lidocaine ointment to sacral area for pain.   We will continue to support and follow as needed.   Goals of care   4/9- Mr. Spilsbury wishes to be a FULL CODE and pursue treatments that can improve his level of health and functioning. He has HCPOA listing his wife as Management consultant, and Living Will documents on file. Patient and wife interested in MOST form to help consolidate wishes onto one form that could be honored by medical providers/EMS in the event of an emergency. They are taking form home for review and to decide if they would like to complete at next visit  I discussed the importance of continued conversation with family and their medical providers regarding overall plan of care and treatment options, ensuring decisions are within the context of the patients values and GOCs.  PLAN: Compazine 10 mg PO/per tube every 6 hours as needed for nausea. Continue  nightly Remeron. Monitor sleep pattern in setting of changes to diet, bowel pattern, and adjustment to home environment. Lidocaine ointment to sacral area as needed Lomotil as needed up to four times daily. Education provided on safe use and prevention of constipation.  Protonix twice daily  Palliative will plan to see patient back in 2-4 weeks in collaboration to other oncology appointments.   Patient expressed understanding and was in agreement with this plan. He also understands that He can call the clinic at any time with any questions, concerns, or complaints.    Visit consisted of counseling and education dealing with the complex and emotionally intense issues of symptom management and palliative care in the setting of serious and potentially life-threatening illness.Greater than 50%  of this time was spent counseling and coordinating care related to the above assessment and plan.  Willette Alma, AGPCNP-BC  Palliative Medicine Team/[facility]  *Please note that this is a verbal dictation therefore any spelling or grammatical errors are due to the "[employer]agon [employer] One" system interpretation.

## 2022-11-13 ENCOUNTER — Other Ambulatory Visit: Payer: Self-pay

## 2022-11-13 ENCOUNTER — Inpatient Hospital Stay: Payer: Medicare Other | Admitting: Internal Medicine

## 2022-11-13 ENCOUNTER — Encounter: Payer: Self-pay | Admitting: Nurse Practitioner

## 2022-11-13 ENCOUNTER — Inpatient Hospital Stay (HOSPITAL_BASED_OUTPATIENT_CLINIC_OR_DEPARTMENT_OTHER): Payer: Medicare Other | Admitting: Nurse Practitioner

## 2022-11-13 VITALS — BP 113/67 | HR 112 | Temp 97.8°F | Resp 17 | Wt 142.7 lb

## 2022-11-13 DIAGNOSIS — R1314 Dysphagia, pharyngoesophageal phase: Secondary | ICD-10-CM | POA: Diagnosis not present

## 2022-11-13 DIAGNOSIS — C3492 Malignant neoplasm of unspecified part of left bronchus or lung: Secondary | ICD-10-CM

## 2022-11-13 DIAGNOSIS — E785 Hyperlipidemia, unspecified: Secondary | ICD-10-CM | POA: Diagnosis not present

## 2022-11-13 DIAGNOSIS — I10 Essential (primary) hypertension: Secondary | ICD-10-CM | POA: Diagnosis not present

## 2022-11-13 DIAGNOSIS — Z7982 Long term (current) use of aspirin: Secondary | ICD-10-CM | POA: Diagnosis not present

## 2022-11-13 DIAGNOSIS — C49A5 Gastrointestinal stromal tumor of rectum: Secondary | ICD-10-CM | POA: Diagnosis not present

## 2022-11-13 DIAGNOSIS — Z8673 Personal history of transient ischemic attack (TIA), and cerebral infarction without residual deficits: Secondary | ICD-10-CM | POA: Diagnosis not present

## 2022-11-13 DIAGNOSIS — R197 Diarrhea, unspecified: Secondary | ICD-10-CM | POA: Diagnosis not present

## 2022-11-13 DIAGNOSIS — Z902 Acquired absence of lung [part of]: Secondary | ICD-10-CM | POA: Diagnosis not present

## 2022-11-13 DIAGNOSIS — C771 Secondary and unspecified malignant neoplasm of intrathoracic lymph nodes: Secondary | ICD-10-CM | POA: Diagnosis not present

## 2022-11-13 DIAGNOSIS — E43 Unspecified severe protein-calorie malnutrition: Secondary | ICD-10-CM | POA: Diagnosis not present

## 2022-11-13 DIAGNOSIS — Z515 Encounter for palliative care: Secondary | ICD-10-CM

## 2022-11-13 DIAGNOSIS — Z923 Personal history of irradiation: Secondary | ICD-10-CM | POA: Diagnosis not present

## 2022-11-13 DIAGNOSIS — C3412 Malignant neoplasm of upper lobe, left bronchus or lung: Secondary | ICD-10-CM | POA: Diagnosis not present

## 2022-11-13 DIAGNOSIS — Z79899 Other long term (current) drug therapy: Secondary | ICD-10-CM | POA: Diagnosis not present

## 2022-11-13 DIAGNOSIS — Z9221 Personal history of antineoplastic chemotherapy: Secondary | ICD-10-CM | POA: Diagnosis not present

## 2022-11-13 DIAGNOSIS — Z9089 Acquired absence of other organs: Secondary | ICD-10-CM | POA: Diagnosis not present

## 2022-11-13 DIAGNOSIS — J449 Chronic obstructive pulmonary disease, unspecified: Secondary | ICD-10-CM | POA: Diagnosis not present

## 2022-11-13 DIAGNOSIS — M6281 Muscle weakness (generalized): Secondary | ICD-10-CM | POA: Diagnosis not present

## 2022-11-13 DIAGNOSIS — G9340 Encephalopathy, unspecified: Secondary | ICD-10-CM | POA: Diagnosis not present

## 2022-11-13 DIAGNOSIS — K219 Gastro-esophageal reflux disease without esophagitis: Secondary | ICD-10-CM | POA: Diagnosis not present

## 2022-11-13 MED ORDER — DIPHENOXYLATE-ATROPINE 2.5-0.025 MG PO TABS: 2.0000 | ORAL_TABLET | Freq: Three times a day (TID) | ORAL | 0 refills | Status: DC | PRN

## 2022-11-13 NOTE — Progress Notes (Signed)
[facility] [phone]   [phone]  OFFICE PROGRESS NOTE  [name], MD [address] [place] [address]  DIAGNOSIS:  1) recurrent non-small cell lung cancer, adenocarcinoma with positive mediastinal lymph node in December 2023 that was initially diagnosed as stage IIB (T1 a, N1, M0) non-small cell lung cancer, adenocarcinoma diagnosed in June 2021. 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  Biomarker Findings Tumor Mutational Burden - 20 Muts/Mb Microsatellite status - MS-Stable Genomic Findings For a complete list of the genes assayed, please refer to the Appendix. KEAP1 I435fs*17 DNMT3A E817* RB1 splice site 1960+5G>C TP53 R209* 8 Disease relevant genes with no reportable alterations: ALK, BRAF, EGFR, ERBB2, KRAS, MET, RET, ROS1  PDL1 Expression: 1 %  PRIOR THERAPY:  1) status post left upper lobectomy with lymph node dissection on December 16, 2019 under the care of Dr. Cliffton Asters.  The tumor size measured 0.9 cm but there was involvement of the level 10 L and 11 L.  2) Adjuvant systemic chemotherapy with cisplatin 75 mg/M2 and Alimta 500 mg/M2 every 3 weeks.  First dose February 04, 2020.  Status post 4 cycles. 3) Neoadjuvant treatment with imatinib 400 mg p.o. daily for the GIST tumor of the pelvic area.  First dose started July 18, 2020.  Status post 12 months of treatment. 4) status post robotic assisted very low anterior rectosigmoid resection with coloanal anastomosis, diverting loop ileostomy and wedge liver biopsy under the care of Dr. Michaell Cowing on December 14, 2021 and it showed minimal residual gastrointestinal stromal tumor. 5) concurrent chemoradiation with weekly carboplatin for AUC of 2 and paclitaxel 45 Mg/M2.  First dose May 14, 2022. 6) Imatinib 400 mg p.o. daily  CURRENT THERAPY: Observation   INTERVAL HISTORY: Jesse [name] 72 y.o. male returns to the clinic today for follow-up visit  accompanied by his wife.  The patient is feeling fine except for the baseline fatigue and weakness as well as lack of appetite and frequent diarrhea with a small sized stool that up to 50-60 times a day.  He was seen by Dr. Leone Payor and started treatment with cholestyramine but he has more diarrhea with the medication and he stopped it few days ago and felt much better.  We also discontinued his treatment with imatinib.  He denied having any current chest pain but has shortness of breath at baseline increased with exertion with no cough or hemoptysis.  He has no nausea, vomiting, abdominal pain or constipation.  He had repeat PET scan performed recently and he is here for evaluation and discussion of his PET scan results and treatment options.    MEDICAL HISTORY: Past Medical History:  Diagnosis Date   Anemia    Cancer (HCC)    bladder cancer   COPD (chronic obstructive pulmonary disease) (HCC)    mild    DIVERTICULOSIS, COLON 01/14/2007   Qualifier: Diagnosis of  By: Marcelyn Ditty RN, Katy Fitch    GASTRITIS, CHRONIC 06/01/2004   Qualifier: Diagnosis of  By: Creta Levin CMA (AAMA), Robin     GERD 01/09/2007   Qualifier: Diagnosis of  By: Tawanna Cooler RN, Ellen     Heart murmur    as a child; no murmur heard 12/14/19   History of radiation therapy    Mediastinum- 05/15/22-06/28/22-Dr. Antony Blackbird   HYPERLIPIDEMIA 01/09/2007   Qualifier: Diagnosis of  By: Everett Graff     Hypertension    Lung mass    left upper  nodule   Macular degeneration    right eye   NEOPLASM, MALIGNANT, BLADDER, HX OF 2002   Qualifier: Diagnosis of  By: Creta Levin CMA (AAMA), Robin  / no chemo or radiation   OSTEOARTHRITIS 01/14/2007   Qualifier: Diagnosis of  By: Marcelyn Ditty, RN, Katy Fitch - knees   Rectal mass    Reflux esophagitis 06/01/2004   Qualifier: Diagnosis of  By: Creta Levin CMA (AAMA), Robin     Stroke [facility])    TOBACCO USER 02/16/2009   Qualifier: Diagnosis of  By: Amador Cunas  MD, Janett Labella    VITAMIN D DEFICIENCY  06/03/2007   Qualifier: Diagnosis of  By: Amador Cunas  MD, Janett Labella     ALLERGIES:  has No Known Allergies.  MEDICATIONS:  Current Outpatient Medications  Medication Sig Dispense Refill   acetaminophen (TYLENOL) 500 MG tablet Take 1,000 mg by mouth every 8 (eight) hours as needed for moderate pain.     amLODipine (NORVASC) 5 MG tablet Take 1 tablet (5 mg total) by mouth daily. (Patient not taking: Reported on 10/31/2022) 90 tablet 3   aspirin 81 MG chewable tablet Chew 1 tablet (81 mg total) by mouth daily.     atorvastatin (LIPITOR) 40 MG tablet Take 1/2 tablet (20 mg total) by mouth daily. 90 tablet 3   Cholecalciferol (VITAMIN D3 PO) Take 2,000 Units by mouth daily.     cholestyramine (QUESTRAN) 4 GM/DOSE powder 4 g po bid with meals 378 g 11   diphenoxylate-atropine (LOMOTIL) 2.5-0.025 MG tablet Take 2 tablets by mouth 3 (three) times daily as needed for diarrhea or loose stools. 90 tablet 6   doxycycline (VIBRA-TABS) 100 MG tablet Take 1 tablet (100 mg total) by mouth 2 (two) times daily. 20 tablet 0   ferrous sulfate 325 (65 FE) MG EC tablet Take 1 tablet (325 mg total) by mouth 2 (two) times daily. 60 tablet 1   imatinib (GLEEVEC) 400 MG tablet Take 1 tablet (400 mg total) by mouth daily. Take with meals and large glass of water.Caution:Chemotherapy. (Patient taking differently: Take 400 mg by mouth at bedtime. Take with meals and large glass of water.Caution:Chemotherapy.) 30 tablet 3   lidocaine (XYLOCAINE) 2 % solution Use as directed 15 mLs in the mouth or throat 4 (four) times daily -  before meals and at bedtime. (Patient not taking: Reported on 10/31/2022) 420 mL 1   lidocaine (XYLOCAINE) 5 % ointment Apply 1 Application topically 3 (three) times daily as needed for mild pain or moderate pain. (Patient not taking: Reported on 10/31/2022) 35.44 g 3   loperamide (IMODIUM A-D) 2 MG tablet Take 4 mg by mouth 4 (four) times daily as needed for diarrhea or loose stools.     mirtazapine  (REMERON) 30 MG tablet Take 30 mg by mouth at bedtime.     Multiple Vitamins-Minerals (PRESERVISION AREDS 2) CAPS Take 1 capsule by mouth 2 (two) times daily.     Nutritional Supplements (FEEDING SUPPLEMENT, JEVITY 1.5 CAL/FIBER,) LIQD Change TF to 1 carton Osmolite 1.5 + 1 carton Jevity 1.5 with 60 mL free water before and after feedings TID via PEG. 711 mL ML   Nutritional Supplements (FEEDING SUPPLEMENT, OSMOLITE 1.5 CAL,) LIQD Change TF to 1 carton Osmolite 1.5 + 1 carton Jevity 1.5 with 60 mL free water before and after feedings TID via PEG. 711 mL 12   pantoprazole (PROTONIX) 40 MG tablet Take 1 tablet (40 mg total) by mouth 2 (two) times daily. 60 tablet 3  sucralfate (CARAFATE) 1 g tablet Take 1 tablet by mouth 4 times daily -  with meals and at bedtime. (Patient taking differently: Take 1 g by mouth 2 (two) times daily.) 120 tablet 11   No current facility-administered medications for this visit.    SURGICAL HISTORY:  Past Surgical History:  Procedure Laterality Date   BIOPSY  06/18/2022   Procedure: BIOPSY;  Surgeon: Shellia Cleverly, DO;  Location: [facility];  Service: Gastroenterology;;   BREAST BIOPSY Left 03/16/2022   [place] LT RADIOACTIVE SEED LOC 03/16/2022 [facility]   BRONCHIAL BIOPSY  10/13/2019   Procedure: BRONCHIAL BIOPSIES;  Surgeon: Josephine Igo, DO;  Location: [facility];  Service: Pulmonary;;   BRONCHIAL BRUSHINGS  10/13/2019   Procedure: BRONCHIAL BRUSHINGS;  Surgeon: Josephine Igo, DO;  [facility]: [facility];  Service: Pulmonary;;   BRONCHIAL NEEDLE ASPIRATION BIOPSY  10/13/2019   Procedure: BRONCHIAL NEEDLE ASPIRATION BIOPSIES;  Surgeon: Josephine Igo, DO;  [facility]: [facility];  Service: Pulmonary;;   BRONCHIAL NEEDLE ASPIRATION BIOPSY  04/11/2022   Procedure: BRONCHIAL NEEDLE ASPIRATION BIOPSIES;  Surgeon: Josephine Igo, DO;  Location: [facility];  Service: Cardiopulmonary;;   BRONCHIAL WASHINGS  10/13/2019   Procedure: BRONCHIAL  WASHINGS;  Surgeon: Josephine Igo, DO;  [facility]: [facility];  Service: Pulmonary;;   COLONOSCOPY     multiple   DIVERTING ILEOSTOMY N/A 12/14/2021   Procedure: DIVERTING ILEOSTOMY;  Surgeon: Karie Soda, MD;  Location: [facility];  Service: General;  Laterality: N/A;   ENDOBRONCHIAL ULTRASOUND Bilateral 04/11/2022   Procedure: ENDOBRONCHIAL ULTRASOUND;  Surgeon: Josephine Igo, DO;  Location: [facility];  Service: Cardiopulmonary;  Laterality: Bilateral;   ESOPHAGEAL DILATION  06/18/2022   Procedure: ESOPHAGEAL DILATION;  Surgeon: Shellia Cleverly, DO;  [facility]: [facility];  Service: Gastroenterology;;   ESOPHAGOGASTRODUODENOSCOPY (EGD) WITH PROPOFOL N/A 06/18/2022   Procedure: ESOPHAGOGASTRODUODENOSCOPY (EGD) WITH PROPOFOL;  Surgeon: Shellia Cleverly, DO;  [facility]: [facility];  Service: Gastroenterology;  Laterality: N/A;  Severe dysphagia/odynophagia ?radiation esophagitis versus oral candidiasis   ILEOSTOMY CLOSURE N/A 03/22/2022   Procedure: OPEN TAKEDOWN OF LOOP ILEOSTOMY;  Surgeon: Karie Soda, MD;  Location: [facility];  Service: General;  Laterality: N/A;  GEN w/ERAS PATHWAY LOCAL   INTERCOSTAL NERVE BLOCK Left 12/16/2019   Procedure: INTERCOSTAL NERVE BLOCK;  Surgeon: Corliss Skains, MD;  Location: [facility];  Service: Thoracic;  Laterality: Left;   IR FLUORO RM 30-60 MIN  07/02/2022   KNEE SURGERY  07/20/2008   bilat.   NASAL SEPTUM SURGERY  1995   NODE DISSECTION Left 12/16/2019   Procedure: NODE DISSECTION;  Surgeon: Corliss Skains, MD;  Location: [facility];  Service: Thoracic;  Laterality: Left;   PEG PLACEMENT N/A 07/04/2022   Procedure: PERCUTANEOUS ENDOSCOPIC GASTROSTOMY (PEG) PLACEMENT;  Surgeon: Iva Boop, MD;  Location: [facility];  Service: Gastroenterology;  Laterality: N/A;   RADIOACTIVE SEED GUIDED AXILLARY SENTINEL LYMPH NODE Left 03/20/2022   Procedure: RADIOACTIVE SEED GUIDED LEFT AXILLARY SENTINEL LYMPH NODE BIOPSY;  Surgeon: Harriette Bouillon, MD;   Location: [facility];  Service: General;  Laterality: Left;   RECTAL EXAM UNDER ANESTHESIA N/A 03/22/2022   Procedure: ANORECTAL EXAMINATION UNDER ANESTHESIA;  Surgeon: Karie Soda, MD;  Location: [facility];  Service: General;  Laterality: N/A;   TONSILLECTOMY     TRANSURETHRAL RESECTION OF BLADDER TUMOR  2002   UPPER GASTROINTESTINAL ENDOSCOPY     VIDEO BRONCHOSCOPY  04/11/2022   Procedure: VIDEO BRONCHOSCOPY;  Surgeon: Josephine Igo, DO;  Location: [facility];  Service: Cardiopulmonary;;   VIDEO BRONCHOSCOPY WITH ENDOBRONCHIAL NAVIGATION N/A 10/13/2019   Procedure: VIDEO BRONCHOSCOPY WITH ENDOBRONCHIAL NAVIGATION;  Surgeon: Josephine Igo, DO;  [facility];  Service: Pulmonary;  Laterality: N/A;   WISDOM TOOTH EXTRACTION     XI ROBOTIC ASSISTED LOWER ANTERIOR RESECTION N/A 12/14/2021   Procedure: XI ROBOTIC ASSISTED LOWER ANTERIOR ULTRA LOW RECTOSIGMOID RESECTION, COLOANAL HAND SEWN ANASTOMOSIS, AND BILATERAL TAP BLOCK;  Surgeon: Karie Soda, MD;  Location: [facility];  Service: General;  Laterality: N/A;    REVIEW OF SYSTEMS:  Constitutional: positive for fatigue Eyes: negative Ears, nose, mouth, throat, and face: negative Respiratory: negative Cardiovascular: negative Gastrointestinal: positive for diarrhea Genitourinary:negative Integument/breast: negative Hematologic/lymphatic: negative Musculoskeletal:positive for muscle weakness Neurological: negative Behavioral/Psych: negative Endocrine: negative Allergic/Immunologic: negative   PHYSICAL EXAMINATION: General appearance: alert, cooperative, fatigued, and no distress Head: Normocephalic, without obvious abnormality, atraumatic Neck: no adenopathy, no JVD, supple, symmetrical, trachea midline, and thyroid not enlarged, symmetric, no tenderness/mass/nodules Lymph nodes: Cervical, supraclavicular, and axillary nodes normal. Resp: clear to auscultation bilaterally Back: symmetric, no curvature. ROM normal. No CVA  tenderness. Cardio: regular rate and rhythm, S1, S2 normal, no murmur, click, rub or gallop GI: soft, non-tender; bowel sounds normal; no masses,  no organomegaly Extremities: extremities normal, atraumatic, no cyanosis or edema Neurologic: Alert and oriented X 3, normal strength and tone. Normal symmetric reflexes. Normal coordination and gait  ECOG PERFORMANCE STATUS: 2 - Symptomatic, <50% confined to bed  Blood pressure 113/67, pulse (!) 112, temperature 97.8 F (36.6 C), temperature source Temporal, resp. rate 17, weight 142 lb 11.2 oz (64.7 kg), SpO2 97%.  LABORATORY DATA: Lab Results  Component Value Date   WBC 5.3 10/22/2022   HGB 10.9 (L) 10/22/2022   HCT 34.3 (L) 10/22/2022   MCV 92.7 10/22/2022   PLT 198 10/22/2022      Chemistry      Component Value Date/Time   NA 137 10/22/2022 1000   K 4.1 10/22/2022 1000   CL 101 10/22/2022 1000   CO2 30 10/22/2022 1000   BUN 24 (H) 10/22/2022 1000   CREATININE 0.80 10/22/2022 1000      Component Value Date/Time   CALCIUM 9.5 10/22/2022 1000   ALKPHOS 114 10/22/2022 1000   AST 26 10/22/2022 1000   ALT 28 10/22/2022 1000   BILITOT 0.4 10/22/2022 1000       RADIOGRAPHIC STUDIES: NM PET Image Restage (PS) Skull Base to Thigh (F-18 FDG)  Result Date: 11/10/2022 CLINICAL DATA:  Choose 2 treatment strategy for non-small cell lung cancer. EXAM: NUCLEAR MEDICINE PET SKULL BASE TO THIGH TECHNIQUE: 7.0 mCi F-18 FDG was injected intravenously. Full-ring PET imaging was performed from the skull base to thigh after the radiotracer. CT data was obtained and used for attenuation correction and anatomic localization. Fasting blood glucose: 97 mg/dl COMPARISON:  CT of the chest 10/22/2022, CT abdomen pelvis 08/06/2022 and PET-CT 02/02/2022. FINDINGS: Mediastinal blood pool activity: SUV max NECK: No hypermetabolic cervical lymph nodes are identified. No suspicious activity identified within the pharyngeal mucosal space. Incidental CT  findings: Bilateral carotid atherosclerosis. Unchanged appearance of the thyroid gland. CHEST: Residual low-level mediastinal, hilar and left axillary nodal activity, improved from the PET-CT. A 1.7 x 1.2 cm left axillary node on image 56/4 has an SUV max of 1.5 (previously 13.9). 9 mm short axis subcarinal node on image 73/4 has an SUV max of 4.9 (previously 8.3). The enlarging mixed cystic and part solid lesion in the right  lower lobe on recent CT demonstrates low level metabolic activity (SUV max 3.2) suspicious for a new focus of adenocarcinoma. This lesion has solid components measuring up to 1.3 cm on image 49/7 and 1.3 cm on image 50/7. Multifocal airspace disease within the right lower lobe has progressed from the recent CT and demonstrates moderate hypermetabolic activity (SUV max 8.1). These opacities are likely infectious or treatment related. Outside of the right lower lobe, there is no hypermetabolic pulmonary activity or suspicious nodularity. Incidental CT findings: Status post left upper lobectomy. Stable radiation changes and architectural distortion in both perihilar regions. Mild centrilobular and paraseptal emphysema. Small left pleural effusion without associated hypermetabolic activity. Atherosclerosis of the aorta, great vessels and coronary arteries. ABDOMEN/PELVIS: There is no hypermetabolic activity within the liver, adrenal glands, spleen or pancreas. There is no hypermetabolic nodal activity in the abdomen or pelvis. No suspicious bowel activity. Incidental CT findings: Nonobstructing bilateral renal calculi. No evidence of ureteral calculus or hydronephrosis. Diffuse aortic and branch vessel atherosclerosis. Unchanged 1.7 x 1.6 cm left adrenal nodule with a nonspecific density of 36 HU. This is unchanged from multiple prior studies and demonstrates no hypermetabolic activity, most consistent with an incidental adenoma. Bowel anastomosis clips and prominent colonic stool are noted.  Patient has a percutaneous G-tube. SKELETON: There is no hypermetabolic activity to suggest osseous metastatic disease. Incidental CT findings: Multilevel spondylosis associated with a convex right lumbar scoliosis. IMPRESSION: 1. The enlarging mixed cystic and part solid lesion in the right lower lobe on recent CT demonstrates low level hypermetabolic activity, suspicious for a new focus of adenocarcinoma. 2. Multifocal airspace disease in the right lower lobe has progressed from the recent CT and demonstrates moderate hypermetabolic activity, likely infectious/inflammatory. Clinical correlation and radiographic follow up recommended. 3. Residual low-level mediastinal, hilar and left axillary nodal activity has improved from the PET-CT and may be reactive. No progressive adenopathy. 4. No evidence of extrathoracic metastatic disease. 5. Aortic Atherosclerosis (ICD10-I70.0) and Emphysema (ICD10-J43.9). Electronically Signed   By: Carey Bullocks M.D.   On: 11/10/2022 12:14   CT Chest W Contrast  Result Date: 10/24/2022 CLINICAL DATA:  Non-small cell lung cancer.  * Tracking Code: BO * EXAM: CT CHEST WITH CONTRAST TECHNIQUE: Multidetector CT imaging of the chest was performed during intravenous contrast administration. RADIATION DOSE REDUCTION: This exam was performed according to the departmental dose-optimization program which includes automated exposure control, adjustment of the mA and/or kV according to patient size and/or use of iterative reconstruction technique. CONTRAST:  75mL OMNIPAQUE IOHEXOL 300 MG/ML  SOLN COMPARISON:  CT chest 07/23/2022 FINDINGS: Cardiovascular: Coronary artery calcification and aortic atherosclerotic calcification. Mediastinum/Nodes: No mediastinal lymphadenopathy. Indistinct tissue planes within the mediastinum related to radiation therapy. Lungs/Pleura: New perihilar post radiation change in the suprahilar LEFT lung. Post LEFT upper lobectomy. Small LEFT effusion is new from  prior. LEFT perihilar linear pattern in the RIGHT lung is also new suggest radiation change. New branching nodular pattern in the RIGHT lower lobe (image 86/17) is most suggestive pulmonary infection. Two larger nodules in the peripheral RIGHT lower lobe measure 11 mm each on image 110/7. These are increased in size from comparison exam and border a cystic lesion. Upper Abdomen: Limited view of the liver, kidneys, pancreas are unremarkable. Normal adrenal glands. Musculoskeletal: Aggressive IMPRESSION: 1. Interval increase in size 2 pulmonary nodules boarding a cystic lesion in the lateral aspect of the RIGHT lower lobe. Findings concerning for lung cancer recurrence. Consider FDG PET scan for further evaluation. 2. New  branching nodular pattern in the RIGHT lower lobe is most suggestive of pulmonary infection. Recommend clinical correlation for pneumonia. 3. New perihilar post radiation change LEFT and RIGHT lung is favored benign. 4. New LEFT pleural effusion. Electronically Signed   By: Genevive Bi M.D.   On: 10/24/2022 09:39    ASSESSMENT AND PLAN: This is a very pleasant 72 years old white male recently diagnosed with a stage IIb (T1 a, N1, M0) non-small cell lung cancer, adenocarcinoma in June 2021 status post left upper lobectomy with lymph node dissection on December 16, 2019 under the care of Dr. Cliffton Asters.  The patient has no actionable mutations and PD-L1 expression was 1%. He was also diagnosed with pelvic GIST tumor. The patient completed a course of adjuvant systemic chemotherapy with cisplatin 75 mg/M2 and Alimta 500 mg/M2 every 3 weeks status post 4 cycles.  He tolerated his treatment well except for fatigue and occasional nausea. For the pelvic GIST tumor, the patient completed neoadjuvant treatment with imatinib 400 mg p.o. daily in April 2023.  Status post 18 months.   The patient underwent surgical resection of the remaining gastrointestinal stromal tumor under the care of Dr. Michaell Cowing  on December 14, 2021 with very minimal residual disease.  He resumed his treatment with imatinib and tolerating it fairly well. Repeat CT scan of the chest for restaging of his lung cancer. There was increased density of soft tissue in the left hilar region concerning for possible developing lymphadenopathy or disease recurrence.  A PET scan was performed recently and that showed new hypermetabolic left axillary, mediastinal and left hilar adenopathy compatible with recurrent metastatic lung cancer no hypermetabolic metastatic disease in the neck, abdomen, pelvis or skeleton. He underwent excisional biopsy of the left axillary lymph node that was not conclusive for malignancy. The patient was seen by Dr. Tonia Brooms and repeat video bronchoscopy with EBUS on April 11, 2022 and biopsy from the station 7 lymph node showed recurrent adenocarcinoma. I had a lengthy discussion with the patient today about his current condition and treatment options. I recommended for the patient a course of concurrent chemoradiation with weekly carboplatin for AUC of 2 and paclitaxel 45 Mg/M2 for 6-7 weeks.  Status post 3 cycles of chemotherapy but he completed the course of radiation.  The patient was admitted to the hospital on June 07, 2022 with significant sepsis and Klebsiella pneumonia bacteremia.  He continues to have persistent diarrhea despite discontinuation of imatinib and treatment with cholestyramine.  This is likely functional diarrhea after his surgical resection for the pelvic GIST tumor. He had repeat PET scan performed recently.  I personally and independently reviewed the scan images and discussed the result with the patient and his wife. His scan showed no concerning findings for disease progression except for suspicious slowly growing adenocarcinoma in the right lower lobe but inflammatory changes in the right lung as well as suspicious reactive mediastinal lymphadenopathy. I referred the patient back to Dr.  Tonia Brooms for consideration of biopsy of the right lower lobe part solid/cystic lesion to confirm or rule out recurrent malignancy. I will see him back for follow-up visit in 1 months for evaluation and discussion of his treatment options based on the biopsy results. For the GIST, he completed more than 2 years of treatment with imatinib which is discontinued secondary to persistent diarrhea. For the diarrhea, he will continue his routine follow-up visit and evaluation by Dr. Leone Payor. The patient was advised to call immediately if he has any other concerning  symptoms in the interval. The patient voices understanding of current disease status and treatment options and is in agreement with the current care plan.  All questions were answered. The patient knows to call the clinic with any problems, questions or concerns. We can certainly see the patient much sooner if necessary. The total time spent in the appointment was 30 minutes.  Disclaimer: This note was dictated with voice recognition software. Similar sounding words can inadvertently be transcribed and may not be corrected upon review.

## 2022-11-15 ENCOUNTER — Telehealth: Payer: Self-pay | Admitting: Internal Medicine

## 2022-11-15 NOTE — Telephone Encounter (Signed)
Scheduled per 07/30 los, patient has been called and voicemail was left. °

## 2022-11-26 ENCOUNTER — Other Ambulatory Visit: Payer: Self-pay

## 2022-11-26 DIAGNOSIS — R1013 Epigastric pain: Secondary | ICD-10-CM

## 2022-11-26 DIAGNOSIS — Z515 Encounter for palliative care: Secondary | ICD-10-CM

## 2022-11-26 DIAGNOSIS — C49A5 Gastrointestinal stromal tumor of rectum: Secondary | ICD-10-CM

## 2022-11-26 MED ORDER — PANTOPRAZOLE SODIUM 40 MG PO TBEC: 40.0000 mg | DELAYED_RELEASE_TABLET | Freq: Two times a day (BID) | ORAL | 3 refills | Status: DC

## 2022-11-26 NOTE — Telephone Encounter (Signed)
Call for refill, see attached orders.

## 2022-11-27 ENCOUNTER — Telehealth: Payer: Self-pay | Admitting: Pulmonary Disease

## 2022-11-27 NOTE — Telephone Encounter (Signed)
Patient would like to schedule a lung biopsy. Patient phone number is [phone].

## 2022-11-28 DIAGNOSIS — G9340 Encephalopathy, unspecified: Secondary | ICD-10-CM | POA: Diagnosis not present

## 2022-11-28 DIAGNOSIS — E43 Unspecified severe protein-calorie malnutrition: Secondary | ICD-10-CM | POA: Diagnosis not present

## 2022-11-28 DIAGNOSIS — Z931 Gastrostomy status: Secondary | ICD-10-CM | POA: Diagnosis not present

## 2022-11-28 DIAGNOSIS — R1314 Dysphagia, pharyngoesophageal phase: Secondary | ICD-10-CM | POA: Diagnosis not present

## 2022-11-28 NOTE — Telephone Encounter (Signed)
Patient returned call. Appt scheduled with Dr. Tonia Brooms on 8/16 for a MyChart Video Visit. Patient verbalized understanding.   Will forward to Dr. Tonia Brooms as Jesse [name].

## 2022-11-28 NOTE — Telephone Encounter (Signed)
Called and left VM to schedule virtual visit with Dr. Tonia Brooms.

## 2022-11-29 ENCOUNTER — Ambulatory Visit: Payer: Medicare Other | Admitting: Nutrition

## 2022-11-29 NOTE — Progress Notes (Signed)
Returned call to [facility] regarding patient's tube feeding order through [facility].  She has had difficulty receiving ordered formula through home health.  She has tried multiple times to contact them and clarify her needs.  As of today she is completely out of both Osmolite 1.5 and Jevity 1.5 and asks for assistance.  Will call [facility] to clarify tube feeding orders and request emergent delivery.  Have offered some samples to get patient through until delivery.

## 2022-11-30 ENCOUNTER — Encounter: Payer: Self-pay | Admitting: Internal Medicine

## 2022-11-30 ENCOUNTER — Telehealth (INDEPENDENT_AMBULATORY_CARE_PROVIDER_SITE_OTHER): Payer: Medicare Other | Admitting: Pulmonary Disease

## 2022-11-30 DIAGNOSIS — C771 Secondary and unspecified malignant neoplasm of intrathoracic lymph nodes: Secondary | ICD-10-CM | POA: Diagnosis not present

## 2022-11-30 DIAGNOSIS — Z85118 Personal history of other malignant neoplasm of bronchus and lung: Secondary | ICD-10-CM | POA: Diagnosis not present

## 2022-11-30 DIAGNOSIS — Z87891 Personal history of nicotine dependence: Secondary | ICD-10-CM

## 2022-11-30 DIAGNOSIS — R911 Solitary pulmonary nodule: Secondary | ICD-10-CM | POA: Diagnosis not present

## 2022-11-30 NOTE — H&P (View-Only) (Signed)
Virtual Visit via Video Note  I connected with Jesse [name] on 11/30/22 at  2:15 PM EDT by a video enabled telemedicine application and verified that I am speaking with the correct person using two identifiers.  Location: Patient: Home  Provider: Office    I discussed the limitations of evaluation and management by telemedicine and the availability of in person appointments. The patient expressed understanding and agreed to proceed.  History of Present Illness:  This is a 72 year old gentleman with a past medical history of hyperlipidemia, gastroesophageal reflux bladder cancer. He was diagnosed with a rectal mass. During this work-up he was found to have a left upper lobe 7 mm pulmonary nodule. Patient underwent navigational bronchoscopy for evaluation of this lesion which was determined to be TTF-1 positive adenocarcinoma. Pathology results reviewed. Patient was referred to cardiothoracic surgery for robotic lobectomy. Patient underwent robotic lobectomy on 12/16/2019 by Dr. Cliffton Asters. 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 There was involvement of level 10 L 11 L lymph nodes on surgical specimen. Therefore decision was made for adjuvant systemic chemotherapy to include cisplatin plus Alimta. Patient was initially followed in our lung cancer screening program. He is here today for new consultation and establish care with primary pulmonologist. I did his procedure as a direct referral for work-up a few months prior.   02/15/2020: Today, today patient doing very well recently started treatments with chemotherapy for adjuvant therapy.  Overall has no respiratory complaints.  He does feel like his chest has healed up well.  He has not had follow-up CT imaging yet.   OV 04/06/2022: Here today for follow-up.  He has been undergoing treatments with Dr. Shirline Frees.  Was diagnosed with a stage IIb non-small cell lung  cancer.I initially saw him in 2021.  He had a repeat staging non-small cell lung cancer PET scan which was completed in October.  This revealed new hypermetabolic left axillary mediastinal and left hilar adenopathy concerning for metastatic disease.  Patient underwent a ultrasound-guided core needle biopsy of the lymph nodes this was completed in the left axilla.  This was done in November 2023.  Pathology from the core needle biopsy was lymphoid tissue with histiocytosis and rare large atypical cells.  After review of the patient's images and pathology oncology felt like this is still concern for recurrence of lung cancer.  Therefore would like to have a repeat bronchoscopy and biopsy of the mediastinal nodes   OV 11/30/2022: Seen today virtual visit via video visit.  Patient known well to me.  Has a repeat staging CT and PET scan completed by medical oncology that shows a right sided peripheral subsolid cystic lesion that has a nodular component that is increasing in size.  Also pet imaging reveals inflammatory changes in the right lower lobe stated patient was treated with antibiotics at that time for pneumonia.  He was referred here for consideration of bronchoscopy and biopsy of the peripheral pulmonary nodules with concern of a underlying malignancy.    Observations/Objective:  Patient in no acute distress able to discuss concepts in complete sentences.  Visible today on video visit.  Nuclear medicine pet imaging: Hypermetabolic uptake within the subsolid cystic lesion in the right lung adjacent to the periphery. There is also lower lobe infiltrates of the lung concerning for infectious/inflammatory infiltrate. The patient's images have been independently reviewed by me.    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 Cliffton Asters.  Had positive lymph nodes undergoing adjuvant  chemotherapy.  Follow-up imaging had enlarged lymph nodes and was seen by me in December 2023 was taken for endobronchial ultrasound.  Now presents with surveillance imaging that shows a new lesion in the right lung that is cystic with 2 nodular areas.  Plan: I am concerned that he potentially has a new primary malignancy in the right lung. Today we talked about the utility of robotic assisted navigational bronchoscopy and tissue sampling. Patient is agreeable to proceed. We talked about the risk of bleeding and pneumothorax. Orders been placed for bronchoscopy on 12/04/2022.  Follow Up Instructions:  I discussed the assessment and treatment plan with the patient. The patient was provided an opportunity to ask questions and all were answered. The patient agreed with the plan and demonstrated an understanding of the instructions.   The patient was advised to call back or seek an in-person evaluation if the symptoms worsen or if the condition fails to improve as anticipated.    Josephine Igo, DO

## 2022-11-30 NOTE — Progress Notes (Signed)
Virtual Visit via Video Note  I connected with Jesse [name] on 11/30/22 at  2:15 PM EDT by a video enabled telemedicine application and verified that I am speaking with the correct person using two identifiers.  Location: Patient: Home  Provider: Office    I discussed the limitations of evaluation and management by telemedicine and the availability of in person appointments. The patient expressed understanding and agreed to proceed.  History of Present Illness:  This is a 72 year old gentleman with a past medical history of hyperlipidemia, gastroesophageal reflux bladder cancer. He was diagnosed with a rectal mass. During this work-up he was found to have a left upper lobe 7 mm pulmonary nodule. Patient underwent navigational bronchoscopy for evaluation of this lesion which was determined to be TTF-1 positive adenocarcinoma. Pathology results reviewed. Patient was referred to cardiothoracic surgery for robotic lobectomy. Patient underwent robotic lobectomy on 12/16/2019 by Dr. Cliffton Asters. 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 There was involvement of level 10 L 11 L lymph nodes on surgical specimen. Therefore decision was made for adjuvant systemic chemotherapy to include cisplatin plus Alimta. Patient was initially followed in our lung cancer screening program. He is here today for new consultation and establish care with primary pulmonologist. I did his procedure as a direct referral for work-up a few months prior.   02/15/2020: Today, today patient doing very well recently started treatments with chemotherapy for adjuvant therapy.  Overall has no respiratory complaints.  He does feel like his chest has healed up well.  He has not had follow-up CT imaging yet.   OV 04/06/2022: Here today for follow-up.  He has been undergoing treatments with Dr. Shirline Frees.  Was diagnosed with a stage IIb non-small cell lung  cancer.I initially saw him in 2021.  He had a repeat staging non-small cell lung cancer PET scan which was completed in October.  This revealed new hypermetabolic left axillary mediastinal and left hilar adenopathy concerning for metastatic disease.  Patient underwent a ultrasound-guided core needle biopsy of the lymph nodes this was completed in the left axilla.  This was done in November 2023.  Pathology from the core needle biopsy was lymphoid tissue with histiocytosis and rare large atypical cells.  After review of the patient's images and pathology oncology felt like this is still concern for recurrence of lung cancer.  Therefore would like to have a repeat bronchoscopy and biopsy of the mediastinal nodes   OV 11/30/2022: Seen today virtual visit via video visit.  Patient known well to me.  Has a repeat staging CT and PET scan completed by medical oncology that shows a right sided peripheral subsolid cystic lesion that has a nodular component that is increasing in size.  Also pet imaging reveals inflammatory changes in the right lower lobe stated patient was treated with antibiotics at that time for pneumonia.  He was referred here for consideration of bronchoscopy and biopsy of the peripheral pulmonary nodules with concern of a underlying malignancy.    Observations/Objective:  Patient in no acute distress able to discuss concepts in complete sentences.  Visible today on video visit.  Nuclear medicine pet imaging: Hypermetabolic uptake within the subsolid cystic lesion in the right lung adjacent to the periphery. There is also lower lobe infiltrates of the lung concerning for infectious/inflammatory infiltrate. The patient's images have been independently reviewed by me.    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 Cliffton Asters.  Had positive lymph nodes undergoing adjuvant  chemotherapy.  Follow-up imaging had enlarged lymph nodes and was seen by me in December 2023 was taken for endobronchial ultrasound.  Now presents with surveillance imaging that shows a new lesion in the right lung that is cystic with 2 nodular areas.  Plan: I am concerned that he potentially has a new primary malignancy in the right lung. Today we talked about the utility of robotic assisted navigational bronchoscopy and tissue sampling. Patient is agreeable to proceed. We talked about the risk of bleeding and pneumothorax. Orders been placed for bronchoscopy on 12/04/2022.  Follow Up Instructions:  I discussed the assessment and treatment plan with the patient. The patient was provided an opportunity to ask questions and all were answered. The patient agreed with the plan and demonstrated an understanding of the instructions.   The patient was advised to call back or seek an in-person evaluation if the symptoms worsen or if the condition fails to improve as anticipated.    Josephine Igo, DO

## 2022-12-03 ENCOUNTER — Other Ambulatory Visit: Payer: Self-pay

## 2022-12-03 ENCOUNTER — Ambulatory Visit (HOSPITAL_COMMUNITY)
Admission: RE | Admit: 2022-12-03 | Discharge: 2022-12-03 | Disposition: A | Discharge status: Home / Self Care | Payer: Medicare Other | Source: Ambulatory Visit | Attending: Pulmonary Disease | Admitting: Pulmonary Disease

## 2022-12-03 ENCOUNTER — Encounter (HOSPITAL_COMMUNITY): Payer: Self-pay | Admitting: Pulmonary Disease

## 2022-12-03 DIAGNOSIS — R918 Other nonspecific abnormal finding of lung field: Secondary | ICD-10-CM | POA: Diagnosis not present

## 2022-12-03 DIAGNOSIS — R911 Solitary pulmonary nodule: Secondary | ICD-10-CM | POA: Diagnosis not present

## 2022-12-03 DIAGNOSIS — J432 Centrilobular emphysema: Secondary | ICD-10-CM | POA: Diagnosis not present

## 2022-12-03 NOTE — Pre-Procedure Instructions (Signed)
PCP - Abbe Amsterdam, MD Cardiologist - Dietrich Pates, MD  Chest x-ray - DOS EKG - 06/06/22 ECHO - 06/12/22  Anesthesia review: Y  Patient verbally denies any shortness of breath, fever, cough and chest pain during phone call   -------------  SDW INSTRUCTIONS given:  Your procedure is scheduled on Tuesday, August 20th.  Report to [facility] Main Entrance "A" at 1150 A.M., and check in at the Admitting office.  Call this number if you have problems the morning of surgery:  [phone]   Remember:  Do not eat or drink after midnight the night before your surgery     Take these medicines the morning of surgery with A SIP OF WATER  pantoprazole (PROTONIX)  acetaminophen (TYLENOL)-if needed diphenoxylate-atropine (LOMOTIL)-if needed loperamide (IMODIUM A-D)-if needed  As of today, STOP taking any Aspirin (unless otherwise instructed by your surgeon) Aleve, Naproxen, Ibuprofen, Motrin, Advil, Goody's, BC's, all herbal medications, fish oil, and all vitamins.                      Do not wear jewelry, make up, or nail polish            Do not wear lotions, powders, perfumes/colognes, or deodorant.            Do not shave 48 hours prior to surgery.  Men may shave face and neck.            Do not bring valuables to the hospital.            [facility] is not responsible for any belongings or valuables.  Do NOT Smoke (Tobacco/Vaping) 24 hours prior to your procedure If you use a CPAP at night, you may bring all equipment for your overnight stay.   Contacts, glasses, dentures or bridgework may not be worn into surgery.      For patients admitted to the hospital, discharge time will be determined by your treatment team.   Patients discharged the day of surgery will not be allowed to drive home, and someone needs to stay with them for 24 hours.    Special instructions:   [facility]- Preparing For Surgery  Before surgery, you can play an important role. Because skin is not sterile,  your skin needs to be as free of germs as possible. You can reduce the number of germs on your skin by washing with CHG (chlorahexidine gluconate) Soap before surgery.  CHG is an antiseptic cleaner which kills germs and bonds with the skin to continue killing germs even after washing.    Oral Hygiene is also important to reduce your risk of infection.  Remember - BRUSH YOUR TEETH THE MORNING OF SURGERY WITH YOUR REGULAR TOOTHPASTE  Please do not use if you have an allergy to CHG or antibacterial soaps. If your skin becomes reddened/irritated stop using the CHG.  Do not shave (including legs and underarms) for at least 48 hours prior to first CHG shower. It is OK to shave your face.  Please follow these instructions carefully.   Shower the NIGHT BEFORE SURGERY and the MORNING OF SURGERY with DIAL Soap.   Pat yourself dry with a CLEAN TOWEL.  Wear CLEAN PAJAMAS to bed the night before surgery  Place CLEAN SHEETS on your bed the night of your first shower and DO NOT SLEEP WITH PETS.   Day of Surgery: Please shower morning of surgery  Wear Clean/Comfortable clothing the morning of surgery Do not apply any deodorants/lotions.  Remember to brush your teeth WITH YOUR REGULAR TOOTHPASTE.   Questions were answered. Patient verbalized understanding of instructions.

## 2022-12-04 ENCOUNTER — Encounter (HOSPITAL_COMMUNITY): Payer: Self-pay | Admitting: Pulmonary Disease

## 2022-12-04 ENCOUNTER — Ambulatory Visit (HOSPITAL_COMMUNITY): Payer: Medicare Other

## 2022-12-04 ENCOUNTER — Ambulatory Visit (HOSPITAL_BASED_OUTPATIENT_CLINIC_OR_DEPARTMENT_OTHER): Payer: Medicare Other | Admitting: Physician Assistant

## 2022-12-04 ENCOUNTER — Other Ambulatory Visit: Payer: Self-pay

## 2022-12-04 ENCOUNTER — Encounter (HOSPITAL_COMMUNITY)
Admission: RE | Disposition: A | Discharge status: Home / Self Care | Payer: Self-pay | Source: Home / Self Care | Attending: Pulmonary Disease

## 2022-12-04 ENCOUNTER — Ambulatory Visit (HOSPITAL_COMMUNITY): Admission: RE | Admit: 2022-12-04 | Payer: Medicare Other | Source: Home / Self Care | Admitting: Pulmonary Disease

## 2022-12-04 ENCOUNTER — Ambulatory Visit (HOSPITAL_COMMUNITY): Payer: Medicare Other | Admitting: Physician Assistant

## 2022-12-04 DIAGNOSIS — Z8551 Personal history of malignant neoplasm of bladder: Secondary | ICD-10-CM | POA: Insufficient documentation

## 2022-12-04 DIAGNOSIS — J984 Other disorders of lung: Secondary | ICD-10-CM | POA: Insufficient documentation

## 2022-12-04 DIAGNOSIS — Z87891 Personal history of nicotine dependence: Secondary | ICD-10-CM | POA: Insufficient documentation

## 2022-12-04 DIAGNOSIS — Z09 Encounter for follow-up examination after completed treatment for conditions other than malignant neoplasm: Secondary | ICD-10-CM | POA: Diagnosis not present

## 2022-12-04 DIAGNOSIS — C3431 Malignant neoplasm of lower lobe, right bronchus or lung: Secondary | ICD-10-CM | POA: Insufficient documentation

## 2022-12-04 DIAGNOSIS — R9389 Abnormal findings on diagnostic imaging of other specified body structures: Secondary | ICD-10-CM | POA: Diagnosis not present

## 2022-12-04 DIAGNOSIS — Z902 Acquired absence of lung [part of]: Secondary | ICD-10-CM | POA: Insufficient documentation

## 2022-12-04 DIAGNOSIS — J9601 Acute respiratory failure with hypoxia: Secondary | ICD-10-CM

## 2022-12-04 DIAGNOSIS — R911 Solitary pulmonary nodule: Secondary | ICD-10-CM

## 2022-12-04 DIAGNOSIS — E785 Hyperlipidemia, unspecified: Secondary | ICD-10-CM | POA: Diagnosis not present

## 2022-12-04 DIAGNOSIS — I10 Essential (primary) hypertension: Secondary | ICD-10-CM | POA: Diagnosis not present

## 2022-12-04 DIAGNOSIS — K219 Gastro-esophageal reflux disease without esophagitis: Secondary | ICD-10-CM | POA: Diagnosis not present

## 2022-12-04 DIAGNOSIS — Z8673 Personal history of transient ischemic attack (TIA), and cerebral infarction without residual deficits: Secondary | ICD-10-CM | POA: Insufficient documentation

## 2022-12-04 DIAGNOSIS — R918 Other nonspecific abnormal finding of lung field: Secondary | ICD-10-CM | POA: Diagnosis not present

## 2022-12-04 DIAGNOSIS — Z48813 Encounter for surgical aftercare following surgery on the respiratory system: Secondary | ICD-10-CM | POA: Diagnosis not present

## 2022-12-04 HISTORY — PX: BRONCHIAL WASHINGS: SHX5105

## 2022-12-04 HISTORY — PX: FIDUCIAL MARKER PLACEMENT: SHX6858

## 2022-12-04 HISTORY — PX: BRONCHIAL BIOPSY: SHX5109

## 2022-12-04 HISTORY — PX: BRONCHIAL NEEDLE ASPIRATION BIOPSY: SHX5106

## 2022-12-04 LAB — CBC
HCT: 37.4 % — ABNORMAL LOW (ref 39.0–52.0)
Hemoglobin: 11.7 g/dL — ABNORMAL LOW (ref 13.0–17.0)
MCH: 29.1 pg (ref 26.0–34.0)
MCHC: 31.3 g/dL (ref 30.0–36.0)
MCV: 93 fL (ref 80.0–100.0)
Platelets: 240 10*3/uL (ref 150–400)
RBC: 4.02 MIL/uL — ABNORMAL LOW (ref 4.22–5.81)
RDW: 16.1 % — ABNORMAL HIGH (ref 11.5–15.5)
WBC: 6.2 10*3/uL (ref 4.0–10.5)
nRBC: 0 % (ref 0.0–0.2)

## 2022-12-04 SURGERY — BRONCHOSCOPY, WITH BIOPSY USING ELECTROMAGNETIC NAVIGATION
Anesthesia: General | Laterality: Right

## 2022-12-04 MED ORDER — LACTATED RINGERS IV SOLN: INTRAVENOUS | Status: DC

## 2022-12-04 MED ORDER — PROPOFOL 10 MG/ML IV BOLUS
INTRAVENOUS | Status: DC | PRN
Start: 2022-12-04 — End: 2022-12-04
  Administered 2022-12-04: 90 mg via INTRAVENOUS

## 2022-12-04 MED ORDER — ROCURONIUM BROMIDE 10 MG/ML (PF) SYRINGE
PREFILLED_SYRINGE | INTRAVENOUS | Status: DC | PRN
  Administered 2022-12-04: 50 mg via INTRAVENOUS

## 2022-12-04 MED ORDER — PROPOFOL 500 MG/50ML IV EMUL
INTRAVENOUS | Status: DC | PRN
  Administered 2022-12-04: 75 ug/kg/min via INTRAVENOUS

## 2022-12-04 MED ORDER — CHLORHEXIDINE GLUCONATE 0.12 % MT SOLN
OROMUCOSAL | Status: AC
  Administered 2022-12-04: 15 mL via OROMUCOSAL
  Filled 2022-12-04: qty 15

## 2022-12-04 MED ORDER — CHLORHEXIDINE GLUCONATE 0.12 % MT SOLN: 15.0000 mL | Freq: Once | OROMUCOSAL | Status: AC

## 2022-12-04 MED ORDER — OXYCODONE HCL 5 MG/5ML PO SOLN: 5.0000 mg | Freq: Once | ORAL | Status: DC | PRN

## 2022-12-04 MED ORDER — ONDANSETRON HCL 4 MG/2ML IJ SOLN
INTRAMUSCULAR | Status: DC | PRN
Start: 2022-12-04 — End: 2022-12-04
  Administered 2022-12-04: 4 mg via INTRAVENOUS

## 2022-12-04 MED ORDER — SUGAMMADEX SODIUM 200 MG/2ML IV SOLN
INTRAVENOUS | Status: DC | PRN
  Administered 2022-12-04: 127 mg via INTRAVENOUS

## 2022-12-04 MED ORDER — AMISULPRIDE (ANTIEMETIC) 5 MG/2ML IV SOLN: 10.0000 mg | Freq: Once | INTRAVENOUS | Status: DC | PRN

## 2022-12-04 MED ORDER — DEXAMETHASONE SODIUM PHOSPHATE 10 MG/ML IJ SOLN
INTRAMUSCULAR | Status: DC | PRN
  Administered 2022-12-04: 10 mg via INTRAVENOUS

## 2022-12-04 MED ORDER — PHENYLEPHRINE 80 MCG/ML (10ML) SYRINGE FOR IV PUSH (FOR BLOOD PRESSURE SUPPORT)
PREFILLED_SYRINGE | INTRAVENOUS | Status: DC | PRN
  Administered 2022-12-04: 80 ug via INTRAVENOUS
  Administered 2022-12-04: 160 ug via INTRAVENOUS
  Administered 2022-12-04: 80 ug via INTRAVENOUS
  Administered 2022-12-04 (×3): 160 ug via INTRAVENOUS

## 2022-12-04 MED ORDER — LIDOCAINE 2% (20 MG/ML) 5 ML SYRINGE
INTRAMUSCULAR | Status: DC | PRN
  Administered 2022-12-04: 100 mg via INTRAVENOUS

## 2022-12-04 MED ORDER — ACETAMINOPHEN 500 MG PO TABS: 1000.0000 mg | ORAL_TABLET | Freq: Once | ORAL | Status: AC

## 2022-12-04 MED ORDER — FENTANYL CITRATE (PF) 100 MCG/2ML IJ SOLN: 25.0000 ug | INTRAMUSCULAR | Status: DC | PRN

## 2022-12-04 MED ORDER — PHENYLEPHRINE HCL-NACL 20-0.9 MG/250ML-% IV SOLN
INTRAVENOUS | Status: DC | PRN
  Administered 2022-12-04: 75 ug/min via INTRAVENOUS

## 2022-12-04 MED ORDER — ACETAMINOPHEN 500 MG PO TABS
ORAL_TABLET | ORAL | Status: AC
  Administered 2022-12-04: 1000 mg via ORAL
  Filled 2022-12-04: qty 2

## 2022-12-04 MED ORDER — OXYCODONE HCL 5 MG PO TABS: 5.0000 mg | ORAL_TABLET | Freq: Once | ORAL | Status: DC | PRN

## 2022-12-04 SURGICAL SUPPLY — 1 items: superlock fiducial marker IMPLANT

## 2022-12-04 NOTE — Research (Signed)
Title: A multi-center, prospective, single-arm, observational study to evaluate real-world outcomes for the shape-sensing Ion endoluminal system  Primary Outcome: Evaluate procedure characteristics and short and long-term patient outcomes following shape-sensing robotic-assisted bronchoscopy (ssRAB) utilizing the Ion Endoluminal System for lung lesion localization or biopsy.   Protocol # / Study Name: ISI-ION-003 Clinical Trials #: [id number] Sponsor: [employer] Principal Investigator: Dr. Elige Radon Icard   Key Features of Ion Endoluminal System (referred to as "Ion") Ion is the first FDA cleared bronchoscopy system that uses fiber optic shape sensing technology to inform on location within the airways. Its catheter/tool channel has a smaller outer diameter (3.5 mm) in comparison to conventional bronchoscopes, allowing it to navigate into the smaller airways of the periphery.     Key Inclusion Criteria Subject is 18 years or older at the time of the procedure Subject is a candidate for a planned, elective RAB lung lesion localization or biopsy procedure in which the Ion Endoluminal System is planned to be utilized.  Subject  able to understand and adhere to study requirements and provide informed consent.   Key Exclusion Criteria Subject is under the care of a Museum/gallery exhibitions officer and is unable to provide informed consent on their own accord.  Subject is participating in an interventional research study or research study with investigational agents with an unknown safety profile that would interfere with participation or the results of this study.  Male subjects who are pregnant or nursing at the time of the index Ion procedure, as determined by standard site practices. Subjects that are incarcerated or institutionalized under court order, or other vulnerable populations.    Previous Clinical Trials Since receiving FDA clearance in Feb 2019, Ion has been adopted  commercially by over 226 centers in the [place], and utilized in over 40,000 procedures.  The first in-human study enrolled 67 subjects with a mean lesion size of 14.8 mm and the overall diagnostic yield was 79.3%, with no adverse events. 17 (58.6%) lesions were reported to have a bronchus sign available on CT imaging.  A multi-center study published results in 2022, with 270 lesions biopsied in 241 patients using Ion. The mean largest cardinal lesion size was 18.86.19mm, and the mean airway generation count was 7.01.6. Asymptomatic pneumothorax occurred in 3.3% of subjects, and 0.8% experienced airway bleeding.   Another study provided preliminary results in 2022, with 87% sensitivity for malignancy, a diagnostic yield of 81%, and a mean lesion size of 16 mm. 75% of biopsy cases were bronchus-sign negative. 4% of subjects experienced pneumothoraces (including those requiring intervention), and 0.8% of subjects experienced airway bleeding requiring wedging or balloon tamponade.  A single-center study captured 131 consecutive procedures of pulmonary biopsy using Ion. The navigational success rate was 98.7%, with an overall diagnostic yield of 81.7%, an overall complication rate of 3%, and a pneumothorax rate of 1.5%.   [facility] Clinical Research Coordinator note:   This visit for Subject Jesse [name] with DOB: 1950-10-19 on 12/04/2022 for the above protocol is Visit/Encounter # Pre-procedure, Intra-procedure and Post-procedure, and is for purpose of research.   The consent for this encounter is under:  Protocol Version 1.0 Investigator Brochure Version N/A Consent Version Revision A, dated 14Nov2023 and is currently IRB approved.   Allayne Gitelman Rayle expressed continued interest and consent in continuing as a study subject. Subject confirmed that there was no change in contact information (e.g. address, telephone, email). Subject thanked for participation in research and contribution to  science. In  this visit 12/04/2022 the subject will be evaluated by Principal Investigator named Dr. Tonia Brooms. This research coordinator has verified that the above investigator is up to date with his/her training logs.   The Subject was informed that the PI continues to have oversight of the subject's visits and course through relevant discussions, reviews, and also specifically of this visit by routing of this note to the PI.  The research study was discussed with the subject in the pre-operative room. The study was explained in detail including all the contents of the informed consent document. The subject was encouraged to ask questions. All questions were answered to their satisfaction. The IRB approved informed consent was signed, and a copy was given to the subject. After obtaining consent, the subject underwent scheduled procedure using the ion endoluminal system. Data collection was completed per protocol. Refer to paper source subject binder for further details.      Signed by  Verdene Lennert Clinical Research Coordinator / Sub-Investigator  [facility]  [place], [place] 1:46 PM 12/04/2022

## 2022-12-04 NOTE — Anesthesia Procedure Notes (Addendum)
Procedure Name: Intubation Date/Time: 12/04/2022 1:01 PM  Performed by: Darryl Nestle, CRNAPre-anesthesia Checklist: Patient identified, Emergency Drugs available, Suction available, Patient being monitored and Timeout performed Patient Re-evaluated:Patient Re-evaluated prior to induction Oxygen Delivery Method: Simple face mask Preoxygenation: Pre-oxygenation with 100% oxygen Induction Type: IV induction Ventilation: Mask ventilation without difficulty Laryngoscope Size: Mac and 3 Grade View: Grade I Tube type: Oral Tube size: 8.5 mm Number of attempts: 1 Placement Confirmation: ETT inserted through vocal cords under direct vision, positive ETCO2 and breath sounds checked- equal and bilateral Secured at: 23 cm Tube secured with: Tape Dental Injury: Teeth and Oropharynx as per pre-operative assessment

## 2022-12-04 NOTE — Op Note (Addendum)
Video Bronchoscopy with Robotic Assisted Bronchoscopic Navigation   Date of Operation: 12/04/2022   Pre-op Diagnosis: lung nodule   Post-op Diagnosis: lung nodule   Surgeon: Josephine Igo, DO   Assistants: none   Anesthesia: General endotracheal anesthesia  Operation: Flexible video fiberoptic bronchoscopy with robotic assistance and biopsies.  Estimated Blood Loss: Minimal  Complications: None  Indications and History: Jesse [name] is a 72 y.o. male with history of RLL lobe. The risks, benefits, complications, treatment options and expected outcomes were discussed with the patient.  The possibilities of pneumothorax, pneumonia, reaction to medication, pulmonary aspiration, perforation of a viscus, bleeding, failure to diagnose a condition and creating a complication requiring transfusion or operation were discussed with the patient who freely signed the consent.    Description of Procedure: The patient was seen in the Preoperative Area, was examined and was deemed appropriate to proceed.  The patient was taken to [facility] endoscopy room 3, identified as Eliezer Mccoy and the procedure verified as Flexible Video Fiberoptic Bronchoscopy.  A Time Out was held and the above information confirmed.   Prior to the date of the procedure a high-resolution CT scan of the chest was performed. Utilizing ION software program a virtual tracheobronchial tree was generated to allow the creation of distinct navigation pathways to the patient's parenchymal abnormalities. After being taken to the operating room general anesthesia was initiated and the patient  was orally intubated. The video fiberoptic bronchoscope was introduced via the endotracheal tube and a general inspection was performed which showed normal right and left lung anatomy, aspiration of the bilateral mainstems was completed to remove any remaining secretions. Robotic catheter inserted into patient's endotracheal tube.   Target #1  RLL: The distinct navigation pathways prepared prior to this procedure were then utilized to navigate to patient's lesion identified on CT scan. The robotic catheter was secured into place and the vision probe was withdrawn.  Lesion location was approximated using fluoroscopy and 3d CBCT for CT guided needle placement for peripheral targeting. Under fluoroscopic guidance transbronchial needle biopsies, and transbronchial forceps biopsies were performed to be sent for cytology and pathology. Following tissue biopsy a single fiducial was placed within proximity of the lesion using the fiducial catheter delivery kit. We then completed a BAL to the right lower lobe.   At the end of the procedure a general airway inspection was performed and there was no evidence of active bleeding. The bronchoscope was removed.  The patient tolerated the procedure well. There was no significant blood loss and there were no obvious complications. A post-procedural chest x-ray is pending.  Samples Target #1: 1. Transbronchial Wang needle biopsies from RLL 2. Transbronchial forceps biopsies from RLL  Plans:  The patient will be discharged from the PACU to home when recovered from anesthesia and after chest x-ray is reviewed. We will review the cytology, pathology results with the patient when they become available. Outpatient followup will be with Josephine Igo, DO.  Josephine Igo, DO [facility] 12/04/2022 1:54 PM

## 2022-12-04 NOTE — Anesthesia Preprocedure Evaluation (Addendum)
Anesthesia Evaluation  Patient identified by MRN, date of birth, ID band Patient awake    Reviewed: Allergy & Precautions, NPO status , Patient's Chart, lab work & pertinent test results  History of Anesthesia Complications Negative for: history of anesthetic complications  Airway Mallampati: II  TM Distance: >3 FB Neck ROM: Full   Comment: Previous grade I view with MAC 4 Dental  (+) Dental Advisory Given   Pulmonary neg shortness of breath, neg sleep apnea, COPD,  COPD inhaler, neg recent URI, former smoker Lung nodule, s/p lobectomy   Pulmonary exam normal breath sounds clear to auscultation       Cardiovascular hypertension, (-) angina + CAD  (-) Past MI, (-) Cardiac Stents and (-) CABG + dysrhythmias (PACs) + Valvular Problems/Murmurs (as a child)  Rhythm:Regular Rate:Normal  HLD  TTE 06/12/2022: IMPRESSIONS     1. Left ventricular ejection fraction, by estimation, is 40 to 45%. The  left ventricle has mildly decreased function. The left ventricle  demonstrates global hypokinesis. The left ventricular internal cavity size  was mildly dilated. Indeterminate  diastolic filling due to E-A fusion.   2. Right ventricular systolic function is normal. The right ventricular  size is normal. Tricuspid regurgitation signal is inadequate for assessing  PA pressure.   3. The mitral valve is grossly normal. No evidence of mitral valve  regurgitation.   4. The aortic valve is grossly normal. Aortic valve regurgitation is not  visualized. No aortic stenosis is present.   5. The inferior vena cava is normal in size with greater than 50%  respiratory variability, suggesting right atrial pressure of 3 mmHg.     Neuro/Psych neg Seizures TIA   GI/Hepatic Neg liver ROS, hiatal hernia,GERD  Medicated,,diverticulosis   Endo/Other  negative endocrine ROS    Renal/GU negative Renal ROS   Bladder cancer    Musculoskeletal  (+)  Arthritis , Osteoarthritis,    Abdominal   Peds  Hematology  (+) Blood dyscrasia, anemia   Anesthesia Other Findings   Reproductive/Obstetrics                             Anesthesia Physical Anesthesia Plan  ASA: 3  Anesthesia Plan: General   Post-op Pain Management: Tylenol PO (pre-op)*   Induction: Intravenous  PONV Risk Score and Plan: 2 and Ondansetron, Dexamethasone and Treatment may vary due to age or medical condition  Airway Management Planned: Oral ETT  Additional Equipment:   Intra-op Plan:   Post-operative Plan: Extubation in OR  Informed Consent: I have reviewed the patients History and Physical, chart, labs and discussed the procedure including the risks, benefits and alternatives for the proposed anesthesia with the patient or authorized representative who has indicated his/her understanding and acceptance.     Dental advisory given  Plan Discussed with: CRNA and Anesthesiologist  Anesthesia Plan Comments: (Risks of general anesthesia discussed including, but not limited to, sore throat, hoarse voice, chipped/damaged teeth, injury to vocal cords, nausea and vomiting, allergic reactions, lung infection, heart attack, stroke, and death. All questions answered. )        Anesthesia Quick Evaluation

## 2022-12-04 NOTE — Anesthesia Postprocedure Evaluation (Signed)
Anesthesia Post Note  Patient: Jesse [name]  Procedure(s) Performed: ROBOTIC ASSISTED NAVIGATIONAL BRONCHOSCOPY (Right) BRONCHIAL BIOPSIES BRONCHIAL NEEDLE ASPIRATION BIOPSIES BRONCHIAL WASHINGS FIDUCIAL MARKER PLACEMENT     Patient location during evaluation: PACU Anesthesia Type: General Level of consciousness: awake Pain management: pain level controlled Vital Signs Assessment: post-procedure vital signs reviewed and stable Respiratory status: spontaneous breathing, nonlabored ventilation and respiratory function stable Cardiovascular status: blood pressure returned to baseline and stable Postop Assessment: no apparent nausea or vomiting Anesthetic complications: no   No notable events documented.  Last Vitals:  Vitals:   12/04/22 1440 12/04/22 1452  BP:  118/73  Pulse: 77 89  Resp: 14 16  Temp: 36.5 C   SpO2:  95%    Last Pain:  Vitals:   12/04/22 1355  TempSrc:   PainSc: 0-No pain                 Linton Rump

## 2022-12-04 NOTE — Interval H&P Note (Signed)
History and Physical Interval Note:  12/04/2022 11:59 AM  Jesse [name]  has presented today for surgery, with the diagnosis of lung nodule.  The various methods of treatment have been discussed with the patient and family. After consideration of risks, benefits and other options for treatment, the patient has consented to  Procedure(s) with comments: ROBOTIC ASSISTED NAVIGATIONAL BRONCHOSCOPY (Right) - Single target as a surgical intervention.  The patient's history has been reviewed, patient examined, no change in status, stable for surgery.  I have reviewed the patient's chart and labs.  Questions were answered to the patient's satisfaction.    There were no vitals taken for this visit. General appearance: 72 y.o., male, NAD, conversant  Eyes: anicteric sclerae, moist conjunctivae; no lid-lag; PERRLA, tracking appropriately HENT: NCAT; oropharynx, MMM, no mucosal ulcerations; normal hard and soft palate Neck: Trachea midline; FROM, supple, lymphadenopathy, no JVD Lungs: CTAB, no crackles, no wheeze, with normal respiratory effort and no intercostal retractions CV: RRR, S1, S2, no MRGs  Abdomen: Soft, non-tender; non-distended, BS present  Extremities: No peripheral edema, radial and DP pulses present bilaterally  Skin: Normal temperature, turgor and texture; no rash Psych: Appropriate affect Neuro: Alert and oriented to person and place, no focal deficit    ROS: denies cough, sob, sputum production, CP, NVD   A:  History of lung cancer  Enlarging right peripheral nodule  Recurrent infiltrates in BL bases   P: RAB to right bilobed peripheral nodule  BAL to RLL Discussed R/B/A, patient is agreeable to proceed.   Josephine Igo, DO [facility] 12/04/2022 12:01 PM

## 2022-12-04 NOTE — Transfer of Care (Signed)
Immediate Anesthesia Transfer of Care Note  Patient: Jesse [name]  Procedure(s) Performed: ROBOTIC ASSISTED NAVIGATIONAL BRONCHOSCOPY (Right) BRONCHIAL BIOPSIES BRONCHIAL NEEDLE ASPIRATION BIOPSIES BRONCHIAL WASHINGS FIDUCIAL MARKER PLACEMENT  Patient Location: PACU  Anesthesia Type:General  Level of Consciousness: awake  Airway & Oxygen Therapy: Patient Spontanous Breathing and Patient connected to nasal cannula oxygen  Post-op Assessment: Report given to RN and Post -op Vital signs reviewed and stable  Post vital signs: Reviewed and stable  Last Vitals:  Vitals Value Taken Time  BP 105/63 12/04/22 1400  Temp 36.5 C 12/04/22 1355  Pulse 75 12/04/22 1400  Resp 17 12/04/22 1400  SpO2 90 % 12/04/22 1400  Vitals shown include unfiled device data.  Last Pain:  Vitals:   12/04/22 1355  TempSrc:   PainSc: 0-No pain      Patients Stated Pain Goal: 0 (12/04/22 1221)  Complications: No notable events documented.

## 2022-12-04 NOTE — Discharge Instructions (Signed)
Flexible Bronchoscopy, Care After This sheet gives you information about how to care for yourself after your test. Your doctor may also give you more specific instructions. If you have problems or questions, contact your doctor. Follow these instructions at home: Eating and drinking Do not eat or drink anything (not even water) for 2 hours after your test, or until your numbing medicine (local anesthetic) wears off. When your numbness is gone and your cough and gag reflexes have come back, you may: Eat only soft foods. Slowly drink liquids. The day after the test, go back to your normal diet. Driving Do not drive for 24 hours if you were given a medicine to help you relax (sedative). Do not drive or use heavy machinery while taking prescription pain medicine. General instructions  Take over-the-counter and prescription medicines only as told by your doctor. Return to your normal activities as told. Ask what activities are safe for you. Do not use any products that have nicotine or tobacco in them. This includes cigarettes and e-cigarettes. If you need help quitting, ask your doctor. Keep all follow-up visits as told by your doctor. This is important. It is very important if you had a tissue sample (biopsy) taken. Get help right away if: You have shortness of breath that gets worse. You get light-headed. You feel like you are going to pass out (faint). You have chest pain. You cough up: More than a little blood. More blood than before. Summary Do not eat or drink anything (not even water) for 2 hours after your test, or until your numbing medicine wears off. Do not use cigarettes. Do not use e-cigarettes. Get help right away if you have chest pain.  This information is not intended to replace advice given to you by your health care provider. Make sure you discuss any questions you have with your health care provider. Document Released: 01/28/2009 Document Revised: 03/15/2017 Document  Reviewed: 04/20/2016 Elsevier Patient Education  2020 [employer]  

## 2022-12-06 ENCOUNTER — Telehealth: Payer: Self-pay | Admitting: Family Medicine

## 2022-12-06 LAB — CULTURE, BAL-QUANTITATIVE W GRAM STAIN: Culture: NO GROWTH

## 2022-12-06 LAB — CYTOLOGY - NON PAP

## 2022-12-06 NOTE — Telephone Encounter (Addendum)
Pt states he has been in & out of the hospital many times, in the last year or so.  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 Pt states he has been dealing with a burning pain in his rectum 60-70 trips to bathroom daily - pain & burning every time Cannot take the burning any longer Pt was offered an OV, but stated MD is familiar with his condition and is asking MD to Please send something for the burning pain in his rectum, as soon as possible.  [id number] - [place], [place] - [address] [name] [address] Phone: [phone]  Fax: [phone]

## 2022-12-07 ENCOUNTER — Ambulatory Visit: Payer: Medicare Other | Admitting: Acute Care

## 2022-12-07 LAB — ACID FAST SMEAR (AFB, MYCOBACTERIA): Acid Fast Smear: NEGATIVE

## 2022-12-08 ENCOUNTER — Encounter (HOSPITAL_COMMUNITY): Payer: Self-pay | Admitting: Pulmonary Disease

## 2022-12-09 LAB — AEROBIC/ANAEROBIC CULTURE W GRAM STAIN (SURGICAL/DEEP WOUND)

## 2022-12-10 NOTE — Progress Notes (Signed)
Imaging reviewed prior to bronchoscopy   Thanks,  BLI  Josephine Igo, DO [facility] 12/10/2022 4:32 PM

## 2022-12-11 ENCOUNTER — Inpatient Hospital Stay (HOSPITAL_BASED_OUTPATIENT_CLINIC_OR_DEPARTMENT_OTHER): Payer: Medicare Other | Admitting: Nurse Practitioner

## 2022-12-11 ENCOUNTER — Inpatient Hospital Stay: Payer: Medicare Other | Attending: Internal Medicine

## 2022-12-11 ENCOUNTER — Inpatient Hospital Stay: Payer: Medicare Other

## 2022-12-11 ENCOUNTER — Encounter: Payer: Self-pay | Admitting: Nurse Practitioner

## 2022-12-11 ENCOUNTER — Inpatient Hospital Stay: Payer: Medicare Other | Admitting: Internal Medicine

## 2022-12-11 ENCOUNTER — Encounter: Payer: Self-pay | Admitting: Internal Medicine

## 2022-12-11 VITALS — BP 128/77 | HR 88 | Temp 97.5°F | Resp 16 | Wt 140.9 lb

## 2022-12-11 DIAGNOSIS — C3492 Malignant neoplasm of unspecified part of left bronchus or lung: Secondary | ICD-10-CM

## 2022-12-11 DIAGNOSIS — Z923 Personal history of irradiation: Secondary | ICD-10-CM | POA: Diagnosis not present

## 2022-12-11 DIAGNOSIS — C3431 Malignant neoplasm of lower lobe, right bronchus or lung: Secondary | ICD-10-CM | POA: Diagnosis not present

## 2022-12-11 DIAGNOSIS — C49A5 Gastrointestinal stromal tumor of rectum: Secondary | ICD-10-CM | POA: Diagnosis not present

## 2022-12-11 DIAGNOSIS — C771 Secondary and unspecified malignant neoplasm of intrathoracic lymph nodes: Secondary | ICD-10-CM | POA: Diagnosis not present

## 2022-12-11 DIAGNOSIS — Z79899 Other long term (current) drug therapy: Secondary | ICD-10-CM | POA: Insufficient documentation

## 2022-12-11 DIAGNOSIS — Z7189 Other specified counseling: Secondary | ICD-10-CM

## 2022-12-11 DIAGNOSIS — R530 Neoplastic (malignant) related fatigue: Secondary | ICD-10-CM | POA: Diagnosis not present

## 2022-12-11 DIAGNOSIS — C3412 Malignant neoplasm of upper lobe, left bronchus or lung: Secondary | ICD-10-CM | POA: Diagnosis present

## 2022-12-11 DIAGNOSIS — R197 Diarrhea, unspecified: Secondary | ICD-10-CM | POA: Insufficient documentation

## 2022-12-11 DIAGNOSIS — Z9221 Personal history of antineoplastic chemotherapy: Secondary | ICD-10-CM | POA: Diagnosis not present

## 2022-12-11 DIAGNOSIS — Z8509 Personal history of malignant neoplasm of other digestive organs: Secondary | ICD-10-CM | POA: Diagnosis not present

## 2022-12-11 DIAGNOSIS — Z7982 Long term (current) use of aspirin: Secondary | ICD-10-CM | POA: Diagnosis not present

## 2022-12-11 DIAGNOSIS — G893 Neoplasm related pain (acute) (chronic): Secondary | ICD-10-CM | POA: Diagnosis not present

## 2022-12-11 DIAGNOSIS — Z515 Encounter for palliative care: Secondary | ICD-10-CM | POA: Diagnosis not present

## 2022-12-11 LAB — CBC WITH DIFFERENTIAL (CANCER CENTER ONLY)
Abs Immature Granulocytes: 0.01 10*3/uL (ref 0.00–0.07)
Basophils Absolute: 0 10*3/uL (ref 0.0–0.1)
Basophils Relative: 1 %
Eosinophils Absolute: 0.1 10*3/uL (ref 0.0–0.5)
Eosinophils Relative: 2 %
HCT: 35.6 % — ABNORMAL LOW (ref 39.0–52.0)
Hemoglobin: 11.4 g/dL — ABNORMAL LOW (ref 13.0–17.0)
Immature Granulocytes: 0 %
Lymphocytes Relative: 12 %
Lymphs Abs: 0.7 10*3/uL (ref 0.7–4.0)
MCH: 28.7 pg (ref 26.0–34.0)
MCHC: 32 g/dL (ref 30.0–36.0)
MCV: 89.7 fL (ref 80.0–100.0)
Monocytes Absolute: 0.5 10*3/uL (ref 0.1–1.0)
Monocytes Relative: 9 %
Neutro Abs: 4.4 10*3/uL (ref 1.7–7.7)
Neutrophils Relative %: 76 %
Platelet Count: 186 10*3/uL (ref 150–400)
RBC: 3.97 MIL/uL — ABNORMAL LOW (ref 4.22–5.81)
RDW: 16.1 % — ABNORMAL HIGH (ref 11.5–15.5)
WBC Count: 5.7 10*3/uL (ref 4.0–10.5)
nRBC: 0 % (ref 0.0–0.2)

## 2022-12-11 LAB — CMP (CANCER CENTER ONLY)
ALT: 21 U/L (ref 0–44)
AST: 17 U/L (ref 15–41)
Albumin: 3.9 g/dL (ref 3.5–5.0)
Alkaline Phosphatase: 103 U/L (ref 38–126)
Anion gap: 6 (ref 5–15)
BUN: 29 mg/dL — ABNORMAL HIGH (ref 8–23)
CO2: 29 mmol/L (ref 22–32)
Calcium: 10 mg/dL (ref 8.9–10.3)
Chloride: 104 mmol/L (ref 98–111)
Creatinine: 0.75 mg/dL (ref 0.61–1.24)
GFR, Estimated: >60 mL/min (ref >60)
Glucose, Bld: 106 mg/dL — ABNORMAL HIGH (ref 70–99)
Potassium: 4.4 mmol/L (ref 3.5–5.1)
Sodium: 139 mmol/L (ref 135–145)
Total Bilirubin: 0.4 mg/dL (ref 0.3–1.2)
Total Protein: 7.1 g/dL (ref 6.5–8.1)

## 2022-12-11 MED ORDER — FOLIC ACID 1 MG PO TABS: 1.0000 mg | ORAL_TABLET | Freq: Every day | ORAL | 4 refills | Status: DC

## 2022-12-11 MED ORDER — PROCHLORPERAZINE MALEATE 10 MG PO TABS: 10.0000 mg | ORAL_TABLET | Freq: Four times a day (QID) | ORAL | 0 refills | Status: DC | PRN

## 2022-12-11 MED ORDER — CYANOCOBALAMIN 1000 MCG/ML IJ SOLN
1000.0000 ug | Freq: Once | INTRAMUSCULAR | Status: AC
  Administered 2022-12-11: 1000 ug via INTRAMUSCULAR
  Filled 2022-12-11: qty 1

## 2022-12-11 NOTE — Progress Notes (Signed)
Palliative Medicine [facility]  [phone] [phone]   Name: Jesse [name] Date: 12/11/2022 MRN: [id number]  DOB: [birth year]  Patient Care Team: [name], MD as PCP - General (Family Medicine) Si Gaul, MD as Consulting Physician (Oncology) Verner Chol, [facility] (Inactive) as Pharmacist (Pharmacist) Iva Boop, MD as Consulting Physician (Gastroenterology) Josephine Igo, DO as Consulting Physician (Pulmonary Disease) Berneice Heinrich Delbert Phenix., MD as Consulting Physician (Urology) Karie Soda, MD as Consulting Physician (General Surgery) Pricilla Riffle, MD as Consulting Physician (Cardiology)    INTERVAL HISTORY: Jesse [name] is a 72 y.o. 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 Previous medical history also includes hypertension, hyperlipidemia, GERD, anemia, COPD, and macular degeneration and a stroke during most recent hospitalization. Surgical history includes ileostomy and takedown. Peg tube placed (07/04/2022) for esophageal stricture/dysphagia. Palliative asked to see for symptom management and goals of care.   SOCIAL HISTORY:     reports that he quit smoking about 3 years ago. His smoking use included cigarettes. He started smoking about 43 years ago. He has a 20 pack-year smoking history. He has never used smokeless tobacco. He reports that he does not currently use alcohol after a past usage of about 15.0 standard drinks of alcohol per week. He reports that he does not currently use drugs after having used the following drugs: Marijuana.  ADVANCE DIRECTIVES:  Advanced directives on file  CODE STATUS: Full code  PAST MEDICAL HISTORY: Past Medical History:  Diagnosis Date   Anemia    Cancer (HCC)    bladder cancer   COPD (chronic obstructive pulmonary disease)  (HCC)    mild    DIVERTICULOSIS, COLON 01/14/2007   Qualifier: Diagnosis of  By: Marcelyn Ditty RN, Katy Fitch    GASTRITIS, CHRONIC 06/01/2004   Qualifier: Diagnosis of  By: Creta Levin CMA (AAMA), Robin     GERD 01/09/2007   Qualifier: Diagnosis of  By: Tawanna Cooler, RN, Ellen     Heart murmur    as a child; no murmur heard 12/14/19   History of radiation therapy    Mediastinum- 05/15/22-06/28/22-Dr. Antony Blackbird   HYPERLIPIDEMIA 01/09/2007   Qualifier: Diagnosis of  By: Everett Graff     Hypertension    Lung mass    left upper nodule   Macular degeneration    right eye   NEOPLASM, MALIGNANT, BLADDER, HX OF 2002   Qualifier: Diagnosis of  By: Creta Levin CMA (AAMA), Robin  / no chemo or radiation   OSTEOARTHRITIS 01/14/2007   Qualifier: Diagnosis of  By: Marcelyn Ditty, RN, Katy Fitch - knees   Rectal mass    Reflux esophagitis 06/01/2004   Qualifier: Diagnosis of  By: Creta Levin CMA (AAMA), Robin     Stroke [facility])    TOBACCO USER 02/16/2009   Qualifier: Diagnosis of  By: Amador Cunas  MD, Janett Labella    VITAMIN D DEFICIENCY 06/03/2007   Qualifier: Diagnosis of  By: Amador Cunas  MD, Janett Labella     ALLERGIES:  has No Known Allergies.  MEDICATIONS:  Current Outpatient Medications  Medication Sig Dispense Refill   acetaminophen (TYLENOL) 500 MG tablet Take 1,000 mg by mouth every 8 (eight) hours as needed for moderate pain.     aspirin 81 MG chewable tablet Chew 1 tablet (81 mg total) by mouth daily.     atorvastatin (LIPITOR) 40 MG  tablet Take 1/2 tablet (20 mg total) by mouth daily. 90 tablet 3   Cholecalciferol (VITAMIN D3 PO) Take 2,000 Units by mouth daily.     diphenoxylate-atropine (LOMOTIL) 2.5-0.025 MG tablet Take 2 tablets by mouth 3 (three) times daily as needed for diarrhea or loose stools. 90 tablet 0   ferrous sulfate 325 (65 FE) MG EC tablet Take 1 tablet (325 mg total) by mouth 2 (two) times daily. 60 tablet 1   imatinib (GLEEVEC) 400 MG tablet Take 1 tablet (400 mg total) by mouth daily. Take with  meals and large glass of water.Caution:Chemotherapy. (Patient not taking: Reported on 12/03/2022) 30 tablet 3   lidocaine (XYLOCAINE) 5 % ointment Apply 1 Application topically 3 (three) times daily as needed for mild pain or moderate pain. 35.44 g 3   loperamide (IMODIUM A-D) 2 MG tablet Take 4 mg by mouth 4 (four) times daily as needed for diarrhea or loose stools.     mirtazapine (REMERON) 30 MG tablet Take 30 mg by mouth at bedtime.     Multiple Vitamins-Minerals (PRESERVISION AREDS 2) CAPS Take 1 capsule by mouth 2 (two) times daily.     Nutritional Supplements (FEEDING SUPPLEMENT, JEVITY 1.5 CAL/FIBER,) LIQD Change TF to 1 carton Osmolite 1.5 + 1 carton Jevity 1.5 with 60 mL free water before and after feedings TID via PEG. (Patient taking differently: Place 237 mLs into feeding tube 2 (two) times daily.) 711 mL ML   Nutritional Supplements (FEEDING SUPPLEMENT, OSMOLITE 1.5 CAL,) LIQD Change TF to 1 carton Osmolite 1.5 + 1 carton Jevity 1.5 with 60 mL free water before and after feedings TID via PEG. (Patient taking differently: Place 237 mLs into feeding tube 2 (two) times daily.) 711 mL 12   pantoprazole (PROTONIX) 40 MG tablet Take 1 tablet (40 mg total) by mouth 2 (two) times daily. 60 tablet 3   No current facility-administered medications for this visit.    VITAL SIGNS: There were no vitals taken for this visit. There were no vitals filed for this visit.  Estimated body mass index is 20.67 kg/m as calculated from the following:   Height as of 12/04/22: 5\' 9"  (1.753 m).   Weight as of 12/04/22: 140 lb (63.5 kg).   PERFORMANCE STATUS (ECOG) : 1 - Symptomatic but completely ambulatory   Physical Exam General: NAD,  Pulmonary: normal breathing pattern  Abdomen: soft, nontender, + bowel sounds, PEG in place Extremities: no edema, no joint deformities Skin: no rashes Neurological: AAO x4  IMPRESSION: Jesse [name] presents to clinic today for follow-up. No acute distress. Wife is  present. He is taking things one day at a time. Denies nausea, vomiting, or constipation. Has been able to do some work outside on tractor. He is hopeful to have PEG tube reversed at some point. Is eating more by mouth including Ensure. He is waiting for further discussions with Dr. Leone Payor.   Patient and wife seen by Dr. Arbutus Ped today and unfortunately cancer has progressed.   1.  Diarrhea/Dyspepsia  Ongoing challenges with loose stools however with some improvement since eating more solid foods and decreasing tube feedings. They are hopeful this will improve as time goes on.  We will continue to support and follow as needed.   Goals of care Jesse [name] and his wife are realistic in their understanding regarding his cancer and recent progression. He is clear in his expressed wishes to continue to treat the treatable remaining hopeful for stability. His wife is hopeful that he is able to tolerate treatment. Their goal is to closely watch his quality of life ensuring he is doing as well as he can. Jesse [name] is emotional stating he knows if he does nothing he will "die" and he is not ready to "give up" yet. Support provided.    4/9- Jesse [name] wishes to be a FULL CODE and pursue treatments that can improve his level of health and functioning. He has HCPOA listing his wife as Management consultant, and Living Will documents on file. Patient and wife interested in MOST form to help consolidate wishes onto one form that could be honored by medical providers/EMS in the event of an emergency. They are taking form home for review and to decide if they would like to complete at next visit  I discussed the importance of continued conversation with family and their medical providers regarding overall plan of care and treatment options, ensuring decisions are within the context of the patients values and GOCs.  PLAN: Lomotil as needed up to four times daily.  Goals of care discussions. Patient is clear in expressed wishes to treat the treatable allowing him an opportunity to continue thriving.   Ongoing goals of care support.  Palliative will plan to see patient back in 2-4 weeks in collaboration to other oncology appointments.   Patient expressed understanding and was  in agreement with this plan. He also understands that He can call the clinic at any time with any questions, concerns, or complaints.    Visit consisted of counseling and education dealing with the complex and emotionally intense issues of symptom management and palliative care in the setting of serious and potentially life-threatening illness.Greater than 50%  of this time was spent counseling and coordinating care related to the above assessment and plan.  Willette Alma, AGPCNP-BC  Palliative Medicine Team/[facility]  *Please note that this is a verbal dictation therefore any spelling or grammatical errors are due to the "Dragon Medical One" system interpretation.

## 2022-12-11 NOTE — Telephone Encounter (Signed)
Inbound call from patient states he would like to discuss having his feeding tube removed and would like to also discuss possibly taking xifaxen. Please advise.   Thank you

## 2022-12-11 NOTE — Progress Notes (Signed)
[facility] [phone]   [phone]  OFFICE PROGRESS NOTE  [name], MD [address] [place] [address]  DIAGNOSIS:  1) recurrent non-small cell lung cancer, adenocarcinoma with positive mediastinal lymph node in December 2023 that was initially diagnosed as stage IIB (T1 a, N1, M0) non-small cell lung cancer, adenocarcinoma diagnosed in June 2021.  The patient also has evidence for recurrent disease in the mediastinal lymph node in January 2024 and in the right lower lobe in August 2024. 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  Biomarker Findings Tumor Mutational Burden - 20 Muts/Mb Microsatellite status - MS-Stable Genomic Findings For a complete list of the genes assayed, please refer to the Appendix. KEAP1 I469fs*17 DNMT3A E817* RB1 splice site 1960+5G>C TP53 R209* 8 Disease relevant genes with no reportable alterations: ALK, BRAF, EGFR, ERBB2, KRAS, MET, RET, ROS1  PDL1 Expression: 1 %  PRIOR THERAPY:  1) status post left upper lobectomy with lymph node dissection on December 16, 2019 under the care of Dr. Cliffton Asters.  The tumor size measured 0.9 cm but there was involvement of the level 10 L and 11 L.  2) Adjuvant systemic chemotherapy with cisplatin 75 mg/M2 and Alimta 500 mg/M2 every 3 weeks.  First dose February 04, 2020.  Status post 4 cycles. 3) Neoadjuvant treatment with imatinib 400 mg p.o. daily for the GIST tumor of the pelvic area.  First dose started July 18, 2020.  Status post 12 months of treatment. 4) status post robotic assisted very low anterior rectosigmoid resection with coloanal anastomosis, diverting loop ileostomy and wedge liver biopsy under the care of Dr. Michaell Cowing on December 14, 2021 and it showed minimal residual gastrointestinal stromal tumor. 5) concurrent chemoradiation with weekly carboplatin for AUC of 2 and paclitaxel 45 Mg/M2.  First dose May 14, 2022. 6) Imatinib 400 mg p.o.  daily  CURRENT THERAPY: Systemic chemotherapy with carboplatin for AUC of 5 and Alimta 500 Mg/M2 every 3 weeks.  First dose 12/25/2022.  INTERVAL HISTORY: Jesse [name] 72 y.o. male returns to the clinic today for follow-up visit accompanied by his wife.  The patient continues to have significant diarrhea.  He mention on the good days it is about 6 times in the past days it is up to 60 time.  He has been using a lot of medication prescribed by Dr. Leone Payor with no improvement.  He now eats good and would like to have the PEG tube removed.  He denied having any current chest pain, shortness of breath, cough or hemoptysis.  He has no nausea, vomiting or constipation.  He has no headache or visual changes.  He had repeat bronchoscopy and biopsy of the right lower lobe lung mass by Dr. Tonia Brooms and the final pathology was consistent with recurrent non-small cell carcinoma.  He is here today for evaluation and discussion of his treatment options.    MEDICAL HISTORY: Past Medical History:  Diagnosis Date   Anemia    Cancer (HCC)    bladder cancer   COPD (chronic obstructive pulmonary disease) (HCC)    mild    DIVERTICULOSIS, COLON 01/14/2007   Qualifier: Diagnosis of  By: Marcelyn Ditty RN, Katy Fitch    GASTRITIS, CHRONIC 06/01/2004   Qualifier: Diagnosis of  By: Creta Levin CMA (AAMA), Robin     GERD 01/09/2007   Qualifier: Diagnosis of  By: Tawanna Cooler RN, Ellen     Heart murmur    as a child; no murmur  heard 12/14/19   History of radiation therapy    Mediastinum- 05/15/22-06/28/22-Dr. Antony Blackbird   HYPERLIPIDEMIA 01/09/2007   Qualifier: Diagnosis of  By: Tawanna Cooler RN, Alvino Chapel     Hypertension    Lung mass    left upper nodule   Macular degeneration    right eye   NEOPLASM, MALIGNANT, BLADDER, HX OF 2002   Qualifier: Diagnosis of  By: Creta Levin CMA (AAMA), Robin  / no chemo or radiation   OSTEOARTHRITIS 01/14/2007   Qualifier: Diagnosis of  By: Marcelyn Ditty, RN, Katy Fitch - knees   Rectal mass    Reflux esophagitis  06/01/2004   Qualifier: Diagnosis of  By: Creta Levin CMA (AAMA), Robin     Stroke [facility])    TOBACCO USER 02/16/2009   Qualifier: Diagnosis of  By: Amador Cunas  MD, Janett Labella    VITAMIN D DEFICIENCY 06/03/2007   Qualifier: Diagnosis of  By: Amador Cunas  MD, Janett Labella     ALLERGIES:  has No Known Allergies.  MEDICATIONS:  Current Outpatient Medications  Medication Sig Dispense Refill   acetaminophen (TYLENOL) 500 MG tablet Take 1,000 mg by mouth every 8 (eight) hours as needed for moderate pain.     aspirin 81 MG chewable tablet Chew 1 tablet (81 mg total) by mouth daily.     atorvastatin (LIPITOR) 40 MG tablet Take 1/2 tablet (20 mg total) by mouth daily. 90 tablet 3   Cholecalciferol (VITAMIN D3 PO) Take 2,000 Units by mouth daily.     diphenoxylate-atropine (LOMOTIL) 2.5-0.025 MG tablet Take 2 tablets by mouth 3 (three) times daily as needed for diarrhea or loose stools. 90 tablet 0   ferrous sulfate 325 (65 FE) MG EC tablet Take 1 tablet (325 mg total) by mouth 2 (two) times daily. 60 tablet 1   imatinib (GLEEVEC) 400 MG tablet Take 1 tablet (400 mg total) by mouth daily. Take with meals and large glass of water.Caution:Chemotherapy. (Patient not taking: Reported on 12/03/2022) 30 tablet 3   lidocaine (XYLOCAINE) 5 % ointment Apply 1 Application topically 3 (three) times daily as needed for mild pain or moderate pain. 35.44 g 3   loperamide (IMODIUM A-D) 2 MG tablet Take 4 mg by mouth 4 (four) times daily as needed for diarrhea or loose stools.     mirtazapine (REMERON) 30 MG tablet Take 30 mg by mouth at bedtime.     Multiple Vitamins-Minerals (PRESERVISION AREDS 2) CAPS Take 1 capsule by mouth 2 (two) times daily.     Nutritional Supplements (FEEDING SUPPLEMENT, JEVITY 1.5 CAL/FIBER,) LIQD Change TF to 1 carton Osmolite 1.5 + 1 carton Jevity 1.5 with 60 mL free water before and after feedings TID via PEG. (Patient taking differently: Place 237 mLs into feeding tube 2 (two) times daily.) 711 mL  ML   Nutritional Supplements (FEEDING SUPPLEMENT, OSMOLITE 1.5 CAL,) LIQD Change TF to 1 carton Osmolite 1.5 + 1 carton Jevity 1.5 with 60 mL free water before and after feedings TID via PEG. (Patient taking differently: Place 237 mLs into feeding tube 2 (two) times daily.) 711 mL 12   pantoprazole (PROTONIX) 40 MG tablet Take 1 tablet (40 mg total) by mouth 2 (two) times daily. 60 tablet 3   No current facility-administered medications for this visit.    SURGICAL HISTORY:  Past Surgical History:  Procedure Laterality Date   BIOPSY  06/18/2022   Procedure: BIOPSY;  Surgeon: Shellia Cleverly, DO;  Location: [facility];  Service: Gastroenterology;;   BREAST BIOPSY Left 03/16/2022  [place] LT RADIOACTIVE SEED LOC 03/16/2022 [facility]   BRONCHIAL BIOPSY  10/13/2019   Procedure: BRONCHIAL BIOPSIES;  Surgeon: Josephine Igo, DO;  [facility]: [facility];  Service: Pulmonary;;   BRONCHIAL BIOPSY  12/04/2022   Procedure: BRONCHIAL BIOPSIES;  Surgeon: Josephine Igo, DO;  [facility];  Service: Pulmonary;;   BRONCHIAL BRUSHINGS  10/13/2019   Procedure: BRONCHIAL BRUSHINGS;  Surgeon: Josephine Igo, DO;  [facility];  Service: Pulmonary;;   BRONCHIAL NEEDLE ASPIRATION BIOPSY  10/13/2019   Procedure: BRONCHIAL NEEDLE ASPIRATION BIOPSIES;  Surgeon: Josephine Igo, DO;  [facility];  Service: Pulmonary;;   BRONCHIAL NEEDLE ASPIRATION BIOPSY  04/11/2022   Procedure: BRONCHIAL NEEDLE ASPIRATION BIOPSIES;  Surgeon: Josephine Igo, DO;  [facility];  Service: Cardiopulmonary;;   BRONCHIAL NEEDLE ASPIRATION BIOPSY  12/04/2022   Procedure: BRONCHIAL NEEDLE ASPIRATION BIOPSIES;  Surgeon: Josephine Igo, DO;  [facility];  Service: Pulmonary;;   BRONCHIAL WASHINGS  10/13/2019   Procedure: BRONCHIAL WASHINGS;  Surgeon: Josephine Igo, DO;  [facility];  Service: Pulmonary;;   BRONCHIAL WASHINGS  12/04/2022   Procedure: BRONCHIAL  WASHINGS;  Surgeon: Josephine Igo, DO;  [facility];  Service: Pulmonary;;   COLONOSCOPY     multiple   DIVERTING ILEOSTOMY N/A 12/14/2021   Procedure: DIVERTING ILEOSTOMY;  Surgeon: Karie Soda, MD;  [facility]: [facility];  Service: General;  Laterality: N/A;   ENDOBRONCHIAL ULTRASOUND Bilateral 04/11/2022   Procedure: ENDOBRONCHIAL ULTRASOUND;  Surgeon: Josephine Igo, DO;  [facility];  Service: Cardiopulmonary;  Laterality: Bilateral;   ESOPHAGEAL DILATION  06/18/2022   Procedure: ESOPHAGEAL DILATION;  Surgeon: Shellia Cleverly, DO;  [facility];  Service: Gastroenterology;;   ESOPHAGOGASTRODUODENOSCOPY (EGD) WITH PROPOFOL N/A 06/18/2022   Procedure: ESOPHAGOGASTRODUODENOSCOPY (EGD) WITH PROPOFOL;  Surgeon: Shellia Cleverly, DO;  [facility];  Service: Gastroenterology;  Laterality: N/A;  Severe dysphagia/odynophagia ?radiation esophagitis versus oral candidiasis   FIDUCIAL MARKER PLACEMENT  12/04/2022   Procedure: FIDUCIAL MARKER PLACEMENT;  Surgeon: Josephine Igo, DO;  [facility];  Service: Pulmonary;;   ILEOSTOMY CLOSURE N/A 03/22/2022   Procedure: OPEN TAKEDOWN OF LOOP ILEOSTOMY;  Surgeon: Karie Soda, MD;  [facility]: [facility];  Service: General;  Laterality: N/A;  GEN w/ERAS PATHWAY LOCAL   INTERCOSTAL NERVE BLOCK Left 12/16/2019   Procedure: INTERCOSTAL NERVE BLOCK;  Surgeon: Corliss Skains, MD;  [facility];  Service: Thoracic;  Laterality: Left;   IR FLUORO RM 30-60 MIN  07/02/2022   KNEE SURGERY  07/20/2008   bilat.   NASAL SEPTUM SURGERY  1995   NODE DISSECTION Left 12/16/2019   Procedure: NODE DISSECTION;  Surgeon: Corliss Skains, MD;  [facility];  Service: Thoracic;  Laterality: Left;   PEG PLACEMENT N/A 07/04/2022   Procedure: PERCUTANEOUS ENDOSCOPIC GASTROSTOMY (PEG) PLACEMENT;  Surgeon: Iva Boop, MD;  Location: [facility];  Service: Gastroenterology;  Laterality: N/A;   RADIOACTIVE SEED GUIDED  AXILLARY SENTINEL LYMPH NODE Left 03/20/2022   Procedure: RADIOACTIVE SEED GUIDED LEFT AXILLARY SENTINEL LYMPH NODE BIOPSY;  Surgeon: Harriette Bouillon, MD;  [facility];  Service: General;  Laterality: Left;   RECTAL EXAM UNDER ANESTHESIA N/A 03/22/2022   Procedure: ANORECTAL EXAMINATION UNDER ANESTHESIA;  Surgeon: Karie Soda, MD;  Location: [facility];  Service: General;  Laterality: N/A;   TONSILLECTOMY     TRANSURETHRAL RESECTION OF BLADDER TUMOR  2002   UPPER GASTROINTESTINAL ENDOSCOPY  VIDEO BRONCHOSCOPY  04/11/2022   Procedure: VIDEO BRONCHOSCOPY;  Surgeon: Josephine Igo, DO;  [facility];  Service: Cardiopulmonary;;   VIDEO BRONCHOSCOPY WITH ENDOBRONCHIAL NAVIGATION N/A 10/13/2019   Procedure: VIDEO BRONCHOSCOPY WITH ENDOBRONCHIAL NAVIGATION;  Surgeon: Josephine Igo, DO;  [facility];  Service: Pulmonary;  Laterality: N/A;   WISDOM TOOTH EXTRACTION     XI ROBOTIC ASSISTED LOWER ANTERIOR RESECTION N/A 12/14/2021   Procedure: XI ROBOTIC ASSISTED LOWER ANTERIOR ULTRA LOW RECTOSIGMOID RESECTION, COLOANAL HAND SEWN ANASTOMOSIS, AND BILATERAL TAP BLOCK;  Surgeon: Karie Soda, MD;  Location: [facility];  Service: General;  Laterality: N/A;    REVIEW OF SYSTEMS:  Constitutional: positive for fatigue and weight loss Eyes: negative Ears, nose, mouth, throat, and face: negative Respiratory: negative Cardiovascular: negative Gastrointestinal: positive for diarrhea Genitourinary:negative Integument/breast: negative Hematologic/lymphatic: negative Musculoskeletal:positive for muscle weakness Neurological: negative Behavioral/Psych: negative Endocrine: negative Allergic/Immunologic: negative   PHYSICAL EXAMINATION: General appearance: alert, cooperative, fatigued, and no distress Head: Normocephalic, without obvious abnormality, atraumatic Neck: no adenopathy, no JVD, supple, symmetrical, trachea midline, and thyroid not enlarged, symmetric, no  tenderness/mass/nodules Lymph nodes: Cervical, supraclavicular, and axillary nodes normal. Resp: clear to auscultation bilaterally Back: symmetric, no curvature. ROM normal. No CVA tenderness. Cardio: regular rate and rhythm, S1, S2 normal, no murmur, click, rub or gallop GI: soft, non-tender; bowel sounds normal; no masses,  no organomegaly Extremities: extremities normal, atraumatic, no cyanosis or edema Neurologic: Alert and oriented X 3, normal strength and tone. Normal symmetric reflexes. Normal coordination and gait  ECOG PERFORMANCE STATUS: 1 - Symptomatic but completely ambulatory  Blood pressure 128/77, pulse 88, temperature (!) 97.5 F (36.4 C), temperature source Oral, resp. rate 16, weight 140 lb 14.4 oz (63.9 kg), SpO2 99%.  LABORATORY DATA: Lab Results  Component Value Date   WBC 6.2 12/04/2022   HGB 11.7 (L) 12/04/2022   HCT 37.4 (L) 12/04/2022   MCV 93.0 12/04/2022   PLT 240 12/04/2022      Chemistry      Component Value Date/Time   NA 137 10/22/2022 1000   K 4.1 10/22/2022 1000   CL 101 10/22/2022 1000   CO2 30 10/22/2022 1000   BUN 24 (H) 10/22/2022 1000   CREATININE 0.80 10/22/2022 1000      Component Value Date/Time   CALCIUM 9.5 10/22/2022 1000   ALKPHOS 114 10/22/2022 1000   AST 26 10/22/2022 1000   ALT 28 10/22/2022 1000   BILITOT 0.4 10/22/2022 1000       RADIOGRAPHIC STUDIES: CT Super D Chest Wo Contrast  Result Date: 12/10/2022 CLINICAL DATA:  History of non-small-cell lung cancer. * Tracking Code: BO * EXAM: CT CHEST WITHOUT CONTRAST TECHNIQUE: Multidetector CT imaging of the chest was performed using thin slice collimation for electromagnetic bronchoscopy planning purposes, without intravenous contrast. RADIATION DOSE REDUCTION: This exam was performed according to the departmental dose-optimization program which includes automated exposure control, adjustment of the mA and/or kV according to patient size and/or use of iterative  reconstruction technique. COMPARISON:  10/31/2022 PET.  Chest CT 10/22/2022 FINDINGS: Cardiovascular: Aortic atherosclerosis. Tortuous thoracic aorta. Normal heart size, without pericardial effusion. Left main and 3 vessel coronary artery calcification. Mediastinum/Nodes: Again identified is ill definition of soft tissue planes in the mediastinum. This is presumably radiation induced. Given this limitation, no mediastinal adenopathy. Hilar regions poorly evaluated without intravenous contrast. Lungs/Pleura: Small left pleural effusion is similar to minimally decreased. Moderate centrilobular emphysema. Again identified is left-greater-than-right perihilar radiation fibrosis with consolidation, architectural distortion,  and traction bronchiectasis. Right-greater-than-left mild apical pleuroparenchymal scarring. Irregular anterior right upper lobe density including at 9 mm on 36/4 is most likely related to pleuroparenchymal scarring and is similar. A right apical 6 mm nodule on 23/4 is unchanged. The right lower lobe ground-glass and nodular consolidation is improved superiorly since the prior chest CT. More inferiorly, this is increased since the prior chest CT and relatively similar to the comparison PET. The central cavitation with peripheral nodularity involving the lateral right lower lobe is again identified. The areas of nodularity measure maximally 11 and 12 mm on images 120 and 100 and 18 of series 4. Similar to the prior CT when remeasured in a similar fashion. Upper Abdomen: Normal imaged portions of the liver, spleen, stomach, pancreas, gallbladder, adrenal glands. Punctate bilateral upper pole renal collecting system calculi. Musculoskeletal: No acute osseous abnormality. IMPRESSION: 1. The right lower lobe presumed complex cavitary nodule is unchanged and remains suspicious for recurrent or metachronous neoplasm. 2. No thoracic adenopathy, given above mild limitations. 3. Shifting right lower lobe  ground-glass and nodular consolidation, favoring infection. 4. Similar to minimally decreased small left pleural effusion. 5. Incidental findings, including: Aortic atherosclerosis (ICD10-I70.0), coronary artery atherosclerosis and emphysema (ICD10-J43.9). Nephrolithiasis Electronically Signed   By: Jeronimo Greaves M.D.   On: 12/10/2022 09:09   DG Chest Port 1 View  Result Date: 12/04/2022 CLINICAL DATA:  Status post right bronchoscopy. EXAM: PORTABLE CHEST 1 VIEW COMPARISON:  06/07/2022. Chest CT dated 12/03/2022. PET-CT dated 10/31/2022. FINDINGS: Spiculated mass in the medial left upper lung zone corresponding to an area of postradiation fibrosis and bronchiectasis on the recent PET-CT. Small area of focal airspace opacity with a small linear clip in the right lower lung zone with a small amount of adjacent nodularity. No pneumothorax. Elevated left hemidiaphragm with left lobectomy changes. Thoracic spine degenerative changes. IMPRESSION: 1. No pneumothorax. 2. Small area of focal airspace opacity with a small linear clip in the right lower lung zone with a small amount of adjacent nodularity. This may represent post biopsy changes with a small amount of associated parenchymal hemorrhage and adjacent previously demonstrated nodularity. 3. Spiculated mass in the medial left upper lung zone corresponding to an area of postradiation fibrosis and bronchiectasis on the recent PET-CT. Electronically Signed   By: Beckie Salts M.D.   On: 12/04/2022 16:11   DG C-ARM BRONCHOSCOPY  Result Date: 12/04/2022 C-ARM BRONCHOSCOPY: Fluoroscopy was utilized by the requesting physician.  No radiographic interpretation.   DG C-Arm 1-60 Min-No Report  Result Date: 12/04/2022 Fluoroscopy was utilized by the requesting physician.  No radiographic interpretation.    ASSESSMENT AND PLAN: This is a very pleasant 72 years old white male recently diagnosed with a stage IIb (T1 a, N1, M0) non-small cell lung cancer, adenocarcinoma  in June 2021 status post left upper lobectomy with lymph node dissection on December 16, 2019 under the care of Dr. Cliffton Asters.  The patient has no actionable mutations and PD-L1 expression was 1%. He was also diagnosed with pelvic GIST tumor. The patient completed a course of adjuvant systemic chemotherapy with cisplatin 75 mg/M2 and Alimta 500 mg/M2 every 3 weeks status post 4 cycles.  He tolerated his treatment well except for fatigue and occasional nausea. For the pelvic GIST tumor, the patient completed neoadjuvant treatment with imatinib 400 mg p.o. daily in April 2023.  Status post 18 months.   The patient underwent surgical resection of the remaining gastrointestinal stromal tumor under the care of Dr. Michaell Cowing on December 14, 2021 with very minimal residual disease.  He resumed his treatment with imatinib and tolerating it fairly well. Repeat CT scan of the chest for restaging of his lung cancer. There was increased density of soft tissue in the left hilar region concerning for possible developing lymphadenopathy or disease recurrence.  A PET scan was performed recently and that showed new hypermetabolic left axillary, mediastinal and left hilar adenopathy compatible with recurrent metastatic lung cancer no hypermetabolic metastatic disease in the neck, abdomen, pelvis or skeleton. He underwent excisional biopsy of the left axillary lymph node that was not conclusive for malignancy. The patient was seen by Dr. Tonia Brooms and repeat video bronchoscopy with EBUS on April 11, 2022 and biopsy from the station 7 lymph node showed recurrent adenocarcinoma. I had a lengthy discussion with the patient today about his current condition and treatment options. I recommended for the patient a course of concurrent chemoradiation with weekly carboplatin for AUC of 2 and paclitaxel 45 Mg/M2 for 6-7 weeks.  Status post 3 cycles of chemotherapy but he completed the course of radiation.  The patient was admitted to the  hospital on June 07, 2022 with significant sepsis and Klebsiella pneumonia bacteremia.  He continues to have persistent diarrhea despite discontinuation of imatinib and treatment with cholestyramine.  This is likely functional diarrhea after his surgical resection for the pelvic GIST tumor. Repeat PET scan showed no concerning findings for disease progression except for suspicious slowly growing adenocarcinoma in the right lower lobe but inflammatory changes in the right lung as well as suspicious reactive mediastinal lymphadenopathy.  The patient had repeat bronchoscopy by Dr. Tonia Brooms and the final pathology was consistent with recurrent non-small cell lung cancer. I had a lengthy discussion with the patient and his wife today about his current condition and treatment options. I gave the patient the option of treatment with systemic chemotherapy with a combination of carboplatin for AUC of 5 and Alimta 500 Mg/M2 every 3 weeks versus palliative care and hospice.  He is interested in treatment.  I will not be able to use immunotherapy at this point because of his persistent diarrhea but I would consider it in the future if he has improvement of the diarrhea. I discussed with the patient the adverse effect of the chemotherapy including but not limited to alopecia, myelosuppression, nausea and vomiting, peripheral neuropathy, liver or renal dysfunction. Is expected to start the first dose of this treatment on 12/25/2022. The patient will receive vitamin B12 injection today. I will give him prescription for folic acid 1 mg p.o. daily in addition to Compazine for nausea. He will come back for follow-up visit in around 2 weeks for evaluation and management of any adverse effect of his treatment. For the GIST, he completed more than 2 years of treatment with imatinib which is discontinued secondary to persistent diarrhea. For the diarrhea, he will continue his routine follow-up visit and evaluation by Dr.  Leone Payor. The patient was advised to call immediately if he has any other concerning symptoms in the interval. The patient voices understanding of current disease status and treatment options and is in agreement with the current care plan.  All questions were answered. The patient knows to call the clinic with any problems, questions or concerns. We can certainly see the patient much sooner if necessary. The total time spent in the appointment was 35 minutes.  Disclaimer: This note was dictated with voice recognition software. Similar sounding words can inadvertently be transcribed and may not be corrected  upon review.

## 2022-12-11 NOTE — Progress Notes (Signed)
DISCONTINUE OFF PATHWAY REGIMEN - Non-Small Cell Lung   [id number]:Carboplatin AUC=2 IV D1 + Paclitaxel 45 mg/m2 IV D1 q7 Days + RT:   A cycle is every 7 days, concurrent with RT:     Paclitaxel      Carboplatin   **Always confirm dose/schedule in your pharmacy ordering system**  REASON: Disease Progression PRIOR TREATMENT: Off Pathway: Carboplatin AUC=2 IV D1 + Paclitaxel 45 mg/m2 IV D1 q7 Days + RT TREATMENT RESPONSE: Stable Disease (SD)  START ON PATHWAY REGIMEN - Non-Small Cell Lung     A cycle is every 21 days:     Pemetrexed      Carboplatin   **Always confirm dose/schedule in your pharmacy ordering system**  Patient Characteristics: Stage IV Metastatic, Nonsquamous, Molecular Analysis Completed, Molecular Alteration Present and Targeted Therapy Exhausted OR KRAS G12C+ or HER2+ Present and No Prior Chemo/Immunotherapy OR No Alteration Present, Initial Chemotherapy/Immunotherapy, PS =  0, 1, No Alteration Present, No Alteration Present, Not a Candidate for Immunotherapy Therapeutic Status: Stage IV Metastatic Histology: Nonsquamous Cell Broad Molecular Profiling Status: Animal nutritionist Analysis Results: No Alteration Present ECOG Performance Status: 1 Chemotherapy/Immunotherapy Line of Therapy: Initial Chemotherapy/Immunotherapy EGFR Exons 18-21 Mutation Testing Status: Completed and Negative ALK Fusion/Rearrangement Testing Status: Completed and Negative BRAF V600 Mutation Testing Status: Completed and Negative KRAS G12C Mutation Testing Status: Completed and Negative MET Exon 14 Mutation Testing Status: Completed and Negative RET Fusion/Rearrangement Testing Status: Completed and Negative HER2 Mutation Testing Status: Completed and Negative NTRK Fusion/Rearrangement Testing Status: Completed and Negative ROS1 Fusion/Rearrangement Testing Status: Completed and Negative Immunotherapy Candidate Status: Not a Candidate for Immunotherapy Intent of  Therapy: Non-Curative / Palliative Intent, Discussed with Patient

## 2022-12-12 NOTE — Telephone Encounter (Signed)
Pt stated that he is wanting to speak with a provider about getting his feeding tube removed. Pt stated that he is taking food by mouth now and not using the tube. Pt schedule for an appointment on 12/19/2022 with Hyacinth Meeker PA at 10:30 AM. Pt made aware.  Pt verbalized understanding with all questions answered.

## 2022-12-13 DIAGNOSIS — M6281 Muscle weakness (generalized): Secondary | ICD-10-CM | POA: Diagnosis not present

## 2022-12-13 DIAGNOSIS — R1314 Dysphagia, pharyngoesophageal phase: Secondary | ICD-10-CM | POA: Diagnosis not present

## 2022-12-13 DIAGNOSIS — E43 Unspecified severe protein-calorie malnutrition: Secondary | ICD-10-CM | POA: Diagnosis not present

## 2022-12-13 DIAGNOSIS — Z8673 Personal history of transient ischemic attack (TIA), and cerebral infarction without residual deficits: Secondary | ICD-10-CM | POA: Diagnosis not present

## 2022-12-13 DIAGNOSIS — G9340 Encephalopathy, unspecified: Secondary | ICD-10-CM | POA: Diagnosis not present

## 2022-12-13 NOTE — Telephone Encounter (Signed)
Provider aware of patient's ongoing conditions.  Limited in remaining options for current symptoms.  Would have patient ask his gastroenterologist if there are any additional treatment options.

## 2022-12-13 NOTE — Telephone Encounter (Signed)
Spoke with pt, verbalized understanding. Stated he has a visit with GI on 9/4.

## 2022-12-14 DIAGNOSIS — R1314 Dysphagia, pharyngoesophageal phase: Secondary | ICD-10-CM | POA: Diagnosis not present

## 2022-12-14 DIAGNOSIS — M6281 Muscle weakness (generalized): Secondary | ICD-10-CM | POA: Diagnosis not present

## 2022-12-14 DIAGNOSIS — Z8673 Personal history of transient ischemic attack (TIA), and cerebral infarction without residual deficits: Secondary | ICD-10-CM | POA: Diagnosis not present

## 2022-12-14 DIAGNOSIS — G9340 Encephalopathy, unspecified: Secondary | ICD-10-CM | POA: Diagnosis not present

## 2022-12-14 DIAGNOSIS — E43 Unspecified severe protein-calorie malnutrition: Secondary | ICD-10-CM | POA: Diagnosis not present

## 2022-12-18 NOTE — Progress Notes (Signed)
Pharmacist Chemotherapy Monitoring - Initial Assessment    Anticipated start date: 12/25/22   The following has been reviewed per standard work regarding the patient's treatment regimen: The patient's diagnosis, treatment plan and drug doses, and organ/hematologic function Lab orders and baseline tests specific to treatment regimen  The treatment plan start date, drug sequencing, and pre-medications Prior authorization status  Patient's documented medication list, including drug-drug interaction screen and prescriptions for anti-emetics and supportive care specific to the treatment regimen The drug concentrations, fluid compatibility, administration routes, and timing of the medications to be used The patient's access for treatment and lifetime cumulative dose history, if applicable  The patient's medication allergies and previous infusion related reactions, if applicable   Changes made to treatment plan:  N/A  Follow up needed:  N/A   Jesse [name], RPH, 12/18/2022  2:15 PM

## 2022-12-19 ENCOUNTER — Encounter: Payer: Self-pay | Admitting: Internal Medicine

## 2022-12-19 ENCOUNTER — Telehealth: Payer: Self-pay | Admitting: Internal Medicine

## 2022-12-19 ENCOUNTER — Encounter: Payer: Self-pay | Admitting: Physician Assistant

## 2022-12-19 ENCOUNTER — Other Ambulatory Visit: Payer: Self-pay | Admitting: Physician Assistant

## 2022-12-19 ENCOUNTER — Ambulatory Visit (INDEPENDENT_AMBULATORY_CARE_PROVIDER_SITE_OTHER): Payer: Medicare Other | Admitting: Physician Assistant

## 2022-12-19 VITALS — BP 98/60 | HR 107 | Ht 69.0 in | Wt 139.0 lb

## 2022-12-19 DIAGNOSIS — E43 Unspecified severe protein-calorie malnutrition: Secondary | ICD-10-CM

## 2022-12-19 DIAGNOSIS — K529 Noninfective gastroenteritis and colitis, unspecified: Secondary | ICD-10-CM

## 2022-12-19 DIAGNOSIS — C3492 Malignant neoplasm of unspecified part of left bronchus or lung: Secondary | ICD-10-CM

## 2022-12-19 DIAGNOSIS — C49A5 Gastrointestinal stromal tumor of rectum: Secondary | ICD-10-CM | POA: Diagnosis not present

## 2022-12-19 DIAGNOSIS — R634 Abnormal weight loss: Secondary | ICD-10-CM | POA: Diagnosis not present

## 2022-12-19 DIAGNOSIS — R159 Full incontinence of feces: Secondary | ICD-10-CM

## 2022-12-19 NOTE — Progress Notes (Signed)
Chief Complaint: Discuss feeding tube and diarrhea  HPI:    [name] is a 72 year old Caucasian male with a very complicated medical history including rectal mass, lung cancer about to undergo 3 more rounds of chemo, weight loss and chronic diarrhea, well-known to Dr. Leone [name], who was referred to me by Jesse Saint, MD for cusses feeding tube and for continued diarrhea.    09/19/2022 office visit with Dr. Leone [name] for chronic diarrhea.  At that time discussed that he had issues with diarrhea and fecal incontinence while on tube nutrition status post resection of a rectal GIST.  Diarrhea thought multifactorial.  Has been tried on Lomotil/Imodium/cholestyramine.  Discussed possible colonoscopy to determine if there are other issues in the future.  Also thought possibly the Gleevec was contributing.  At that visit Mirtazapine increased from 15 to 30 mg.  Also underwent pelvic floor therapy.    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  The biopsies were essentially normal.  It was thought that diarrhea could be worsened by Gleevec.    Patient presents to clinic today accompanied by his daughter who assists in his care.  Apparently they have not been using the feeding tube at all over the past 3 weeks because they thought the nutrients were giving him worse and diarrhea.  Since discontinuing tube feeds they have actually seen a very slight improvement in stools noting that over the past week since he has been eating normal food they are may be slightly formed at some points in the day, but still very frequent and limiting his ability to go far from the bathroom.  Still continues with some incontinence.  This is regardless of using Lomotil or Imodium 4-6 times a day.  His daughter asked about the use of Burman Blacksmith which is suggested by her primary care provider.  He has gained about 10 pounds in total since the  feeding tube was placed.  Currently now supplementing his regular meals with boost/Ensure.    Denies fever, chills, blood in his stool, nausea or vomiting.   Past Medical History:  Diagnosis Date   Anemia    Cancer (HCC)    bladder cancer   COPD (chronic obstructive pulmonary disease) (HCC)    mild    DIVERTICULOSIS, COLON 01/14/2007   Qualifier: Diagnosis of  By: Marcelyn Ditty RN, Katy Fitch    GASTRITIS, CHRONIC 06/01/2004   Qualifier: Diagnosis of  By: Creta Levin CMA (AAMA), Robin     GERD 01/09/2007   Qualifier: Diagnosis of  By: Tawanna Cooler, RN, Ellen     Heart murmur    as a child; no murmur heard 12/14/19   History of radiation therapy    Mediastinum- 05/15/22-06/28/22-Dr. Antony Blackbird   HYPERLIPIDEMIA 01/09/2007   Qualifier: Diagnosis of  By: Everett Graff     Hypertension    Lung mass    left upper nodule   Macular degeneration    right eye   NEOPLASM, MALIGNANT, BLADDER, HX OF 2002   Qualifier: Diagnosis of  By: Creta Levin CMA (AAMA), Robin  / no chemo or radiation   OSTEOARTHRITIS 01/14/2007   Qualifier: Diagnosis of  By: Marcelyn Ditty, RN, Katy Fitch - knees   Rectal mass    Reflux esophagitis 06/01/2004   Qualifier: Diagnosis of  By: Creta Levin CMA (AAMA), Robin     Stroke [facility])    TOBACCO USER 02/16/2009   Qualifier: Diagnosis of  By: Amador Cunas  MD,  Janett Labella    VITAMIN D DEFICIENCY 06/03/2007   Qualifier: Diagnosis of  By: Amador Cunas  MD, Janett Labella     Past Surgical History:  Procedure Laterality Date   BIOPSY  06/18/2022   Procedure: BIOPSY;  Surgeon: Shellia Cleverly, DO;  [facility]: [facility];  Service: Gastroenterology;;   BREAST BIOPSY Left 03/16/2022   [place] LT RADIOACTIVE SEED LOC 03/16/2022 [facility]   BRONCHIAL BIOPSY  10/13/2019   Procedure: BRONCHIAL BIOPSIES;  Surgeon: Josephine Igo, DO;  [facility];  Service: Pulmonary;;   BRONCHIAL BIOPSY  12/04/2022   Procedure: BRONCHIAL BIOPSIES;  Surgeon: Josephine Igo, DO;  [facility];   Service: Pulmonary;;   BRONCHIAL BRUSHINGS  10/13/2019   Procedure: BRONCHIAL BRUSHINGS;  Surgeon: Josephine Igo, DO;  [facility];  Service: Pulmonary;;   BRONCHIAL NEEDLE ASPIRATION BIOPSY  10/13/2019   Procedure: BRONCHIAL NEEDLE ASPIRATION BIOPSIES;  Surgeon: Josephine Igo, DO;  [facility];  Service: Pulmonary;;   BRONCHIAL NEEDLE ASPIRATION BIOPSY  04/11/2022   Procedure: BRONCHIAL NEEDLE ASPIRATION BIOPSIES;  Surgeon: Josephine Igo, DO;  [facility];  Service: Cardiopulmonary;;   BRONCHIAL NEEDLE ASPIRATION BIOPSY  12/04/2022   Procedure: BRONCHIAL NEEDLE ASPIRATION BIOPSIES;  Surgeon: Josephine Igo, DO;  [facility];  Service: Pulmonary;;   BRONCHIAL WASHINGS  10/13/2019   Procedure: BRONCHIAL WASHINGS;  Surgeon: Josephine Igo, DO;  [facility];  Service: Pulmonary;;   BRONCHIAL WASHINGS  12/04/2022   Procedure: BRONCHIAL WASHINGS;  Surgeon: Josephine Igo, DO;  [facility];  Service: Pulmonary;;   COLONOSCOPY     multiple   DIVERTING ILEOSTOMY N/A 12/14/2021   Procedure: DIVERTING ILEOSTOMY;  Surgeon: Karie Soda, MD;  [facility];  Service: General;  Laterality: N/A;   ENDOBRONCHIAL ULTRASOUND Bilateral 04/11/2022   Procedure: ENDOBRONCHIAL ULTRASOUND;  Surgeon: Josephine Igo, DO;  [facility];  Service: Cardiopulmonary;  Laterality: Bilateral;   ESOPHAGEAL DILATION  06/18/2022   Procedure: ESOPHAGEAL DILATION;  Surgeon: Shellia Cleverly, DO;  [facility];  Service: Gastroenterology;;   ESOPHAGOGASTRODUODENOSCOPY (EGD) WITH PROPOFOL N/A 06/18/2022   Procedure: ESOPHAGOGASTRODUODENOSCOPY (EGD) WITH PROPOFOL;  Surgeon: Shellia Cleverly, DO;  [facility];  Service: Gastroenterology;  Laterality: N/A;  Severe dysphagia/odynophagia ?radiation esophagitis versus oral candidiasis   FIDUCIAL MARKER PLACEMENT  12/04/2022   Procedure: FIDUCIAL MARKER PLACEMENT;  Surgeon:  Josephine Igo, DO;  [facility];  Service: Pulmonary;;   ILEOSTOMY CLOSURE N/A 03/22/2022   Procedure: OPEN TAKEDOWN OF LOOP ILEOSTOMY;  Surgeon: Karie Soda, MD;  [facility];  Service: General;  Laterality: N/A;  GEN w/ERAS PATHWAY LOCAL   INTERCOSTAL NERVE BLOCK Left 12/16/2019   Procedure: INTERCOSTAL NERVE BLOCK;  Surgeon: Corliss Skains, MD;  [facility];  Service: Thoracic;  Laterality: Left;   IR FLUORO RM 30-60 MIN  07/02/2022   KNEE SURGERY  07/20/2008   bilat.   NASAL SEPTUM SURGERY  1995   NODE DISSECTION Left 12/16/2019   Procedure: NODE DISSECTION;  Surgeon: Corliss Skains, MD;  [facility];  Service: Thoracic;  Laterality: Left;   PEG PLACEMENT N/A 07/04/2022   Procedure: PERCUTANEOUS ENDOSCOPIC GASTROSTOMY (PEG) PLACEMENT;  Surgeon: Iva Boop, MD;  [facility];  Service: Gastroenterology;  Laterality: N/A;   RADIOACTIVE SEED GUIDED AXILLARY SENTINEL LYMPH NODE Left 03/20/2022   Procedure: RADIOACTIVE SEED GUIDED LEFT AXILLARY SENTINEL LYMPH NODE BIOPSY;  Surgeon: Harriette Bouillon, MD;  [facility];  Service: General;  Laterality: Left;   RECTAL EXAM UNDER ANESTHESIA N/A 03/22/2022   Procedure: ANORECTAL EXAMINATION UNDER ANESTHESIA;  Surgeon: Karie Soda, MD;  [facility];  Service: General;  Laterality: N/A;   TONSILLECTOMY     TRANSURETHRAL RESECTION OF BLADDER TUMOR  2002   UPPER GASTROINTESTINAL ENDOSCOPY     VIDEO BRONCHOSCOPY  04/11/2022   Procedure: VIDEO BRONCHOSCOPY;  Surgeon: Josephine Igo, DO;  [facility];  Service: Cardiopulmonary;;   VIDEO BRONCHOSCOPY WITH ENDOBRONCHIAL NAVIGATION N/A 10/13/2019   Procedure: VIDEO BRONCHOSCOPY WITH ENDOBRONCHIAL NAVIGATION;  Surgeon: Josephine Igo, DO;  [facility];  Service: Pulmonary;  Laterality: N/A;   WISDOM TOOTH EXTRACTION     XI ROBOTIC ASSISTED LOWER ANTERIOR RESECTION N/A 12/14/2021   Procedure: XI ROBOTIC ASSISTED LOWER ANTERIOR ULTRA  LOW RECTOSIGMOID RESECTION, COLOANAL HAND SEWN ANASTOMOSIS, AND BILATERAL TAP BLOCK;  Surgeon: Karie Soda, MD;  [facility];  Service: General;  Laterality: N/A;    Current Outpatient Medications  Medication Sig Dispense Refill   acetaminophen (TYLENOL) 500 MG tablet Take 1,000 mg by mouth every 8 (eight) hours as needed for moderate pain.     aspirin 81 MG chewable tablet Chew 1 tablet (81 mg total) by mouth daily.     atorvastatin (LIPITOR) 40 MG tablet Take 1/2 tablet (20 mg total) by mouth daily. 90 tablet 3   Cholecalciferol (VITAMIN D3 PO) Take 2,000 Units by mouth daily.     diphenoxylate-atropine (LOMOTIL) 2.5-0.025 MG tablet Take 2 tablets by mouth 3 (three) times daily as needed for diarrhea or loose stools. 90 tablet 0   ferrous sulfate 325 (65 FE) MG EC tablet Take 1 tablet (325 mg total) by mouth 2 (two) times daily. 60 tablet 1   folic acid (FOLVITE) 1 MG tablet Take 1 tablet (1 mg total) by mouth daily. 30 tablet 4   lidocaine (XYLOCAINE) 5 % ointment Apply 1 Application topically 3 (three) times daily as needed for mild pain or moderate pain. 35.44 g 3   loperamide (IMODIUM A-D) 2 MG tablet Take 4 mg by mouth 4 (four) times daily as needed for diarrhea or loose stools.     mirtazapine (REMERON) 30 MG tablet Take 30 mg by mouth at bedtime.     Multiple Vitamins-Minerals (PRESERVISION AREDS 2) CAPS Take 1 capsule by mouth 2 (two) times daily.     OVER THE COUNTER MEDICATION Take 1 tablet by mouth daily. PROBIOTICS     pantoprazole (PROTONIX) 40 MG tablet Take 1 tablet (40 mg total) by mouth 2 (two) times daily. 60 tablet 3   prochlorperazine (COMPAZINE) 10 MG tablet Take 1 tablet (10 mg total) by mouth every 6 (six) hours as needed for nausea or vomiting. 30 tablet 0   No current facility-administered medications for this visit.    Allergies as of 12/19/2022   (No Known Allergies)    Family History  Problem Relation Age of Onset   Dementia Mother    Diabetes Father     Mental illness Father    Peripheral vascular disease Brother    Colon cancer Neg Hx    Esophageal cancer Neg Hx    Stomach cancer Neg Hx    Rectal cancer Neg Hx     Social History   Socioeconomic History   Marital status: Married    Spouse name: [name]   Number of children: 3   Years of education: Not on file   Highest education  level: 12th grade  Occupational History    Comment: semi retired   Tobacco Use   Smoking status: Former    Current packs/day: 0.00    Average packs/day: 0.5 packs/day for 40.0 years (20.0 ttl pk-yrs)    Types: Cigarettes    Start date: 09/21/1979    Quit date: 09/21/2019    Years since quitting: 3.2   Smokeless tobacco: Never  Vaping Use   Vaping status: Never Used  Substance and Sexual Activity   Alcohol use: Not Currently    Alcohol/week: 15.0 standard drinks of alcohol    Types: 15 Standard drinks or equivalent per week    Comment: wine/beer/martini   Drug use: Not Currently    Types: Marijuana    Comment: Smokes marijuana occasional, last use in 08/2019   Sexual activity: Not Currently  Other Topics Concern   Not on file  Social History Narrative   Married   3 children: 3 grandchildren; 3 great grandchildren   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   Enjoys yard work and Systems analyst   Social Determinants of Corporate investment banker Strain: Low Risk  (10/30/2022)   Overall Financial Resource Strain (CARDIA)    Difficulty of Paying Living Expenses: Not hard at all  Food Insecurity: No Food Insecurity (10/30/2022)   Hunger Vital Sign    Worried About Running Out of Food in the Last Year: Never true    Ran Out of Food in the Last Year: Never true  Transportation Needs: No Transportation Needs (10/30/2022)   PRAPARE - Administrator, Civil Service (Medical): No    Lack of Transportation (Non-Medical): No  Physical Activity: Sufficiently Active (10/30/2022)   Exercise Vital Sign    Days of Exercise per Week: 5 days    Minutes  of Exercise per Session: 30 min  Stress: No Stress Concern Present (10/30/2022)   [employer] of Occupational Health - Occupational Stress Questionnaire    Feeling of Stress : Not at all  Social Connections: Moderately Isolated (07/12/2020)   Social Connection and Isolation Panel [NHANES]    Frequency of Communication with Friends and Family: More than three times a week    Frequency of Social Gatherings with Friends and Family: More than three times a week    Attends Religious Services: Never    Database administrator or Organizations: No    Attends Banker Meetings: Never    Marital Status: Married  Catering manager Violence: Not At Risk (09/12/2022)   Humiliation, Afraid, Rape, and Kick questionnaire    Fear of Current or Ex-Partner: No    Emotionally Abused: No    Physically Abused: No    Sexually Abused: No    Review of Systems:    Constitutional: No fever or chills Cardiovascular: No chest pain  Respiratory: No SOB  Gastrointestinal: See HPI and otherwise negative   Physical Exam:  Vital signs: BP 98/60   Pulse (!) 107   Ht 5\' 9"  (1.753 m)   Wt 139 lb (63 kg)   BMI 20.53 kg/m    Constitutional:   Pleasant frail appearing, elderly Caucasian male appears to be in NAD, Well developed, Well nourished, alert and cooperative Respiratory: Respirations even and unlabored. Lungs clear to auscultation bilaterally.   No wheezes, crackles, or rhonchi.  Cardiovascular: Normal S1, S2. No MRG. Regular rate and rhythm. No peripheral edema, cyanosis or pallor.  Gastrointestinal:  Soft, nondistended, nontender. No rebound or guarding. Normal bowel  sounds. No appreciable masses or hepatomegaly.+ PEG tube Rectal:  Not performed.  Psychiatric:Demonstrates good judgement and reason without abnormal affect or behaviors.  RELEVANT LABS AND IMAGING: CBC    Component Value Date/Time   WBC 5.7 12/11/2022 0802   WBC 6.2 12/04/2022 1225   RBC 3.97 (L) 12/11/2022 0802   HGB  11.4 (L) 12/11/2022 0802   HCT 35.6 (L) 12/11/2022 0802   PLT 186 12/11/2022 0802   MCV 89.7 12/11/2022 0802   MCH 28.7 12/11/2022 0802   MCHC 32.0 12/11/2022 0802   RDW 16.1 (H) 12/11/2022 0802   LYMPHSABS 0.7 12/11/2022 0802   MONOABS 0.5 12/11/2022 0802   EOSABS 0.1 12/11/2022 0802   BASOSABS 0.0 12/11/2022 0802    CMP     Component Value Date/Time   NA 139 12/11/2022 0802   K 4.4 12/11/2022 0802   CL 104 12/11/2022 0802   CO2 29 12/11/2022 0802   GLUCOSE 106 (H) 12/11/2022 0802   BUN 29 (H) 12/11/2022 0802   CREATININE 0.75 12/11/2022 0802   CALCIUM 10.0 12/11/2022 0802   PROT 7.1 12/11/2022 0802   ALBUMIN 3.9 12/11/2022 0802   AST 17 12/11/2022 0802   ALT 21 12/11/2022 0802   ALKPHOS 103 12/11/2022 0802   BILITOT 0.4 12/11/2022 0802   GFRNONAA >60 12/11/2022 0802   GFRAA >60 12/31/2019 1428    Assessment: 1.  Chronic diarrhea: Multifactorial, status post resection of rectal GIST, has undergone colonoscopy with no other etiology 2.  Fecal incontinence 3.  Weight loss: Has actually been able to gain weight recently about 10 pounds since PEG tube insertion 4.  History of GIST tumor of the rectum 5.  History of lung cancer: Patient tells me about to undergo 3 more rounds of chemotherapy for this  Plan: 1.  Again see Dr. Marvell Fuller note from 09/19/2022.  Complicated scenario with multifactorial diarrhea.  Apparently stools do look some better now they have stopped tube feeding, wonder if this is contributing. 2.  At this point encouraged patient to eat nutrients and calories and supplement with boost/Ensure 2-3 times a day. 3.  Document weight, apparently is gone up 10 pounds since PEG tube, but is asking about removal of this.  Will leave this to Dr. Leone [name] to decide. 4.  Daughter question the use of Xifaxan, I am not sure if this would help or possibly increase any risk for him, will leave this discussion we have between Dr. Leone [name] and patient. 5.  Continue  Lomotil/Imodium 6.  Patient to follow in clinic per recommendations from Dr. Leone [name].  Did discuss case with him at time of patient's appointment and he will call to follow-up.  Hyacinth Meeker, PA-C [facility] 12/19/2022, 10:33 AM  Cc: Jesse Saint, MD

## 2022-12-19 NOTE — Telephone Encounter (Signed)
Trying to follow-up after office visit today w/ JLL PA-C  I will try again tomorrow

## 2022-12-20 NOTE — Telephone Encounter (Signed)
Spoke to the patient today.  He is inclined to keep his gastrostomy tube at this time and I had explained I think it is best to have it in case he would need it again.  His stools are forming up a little bit and he wants to see how things go we will hold off on any empiric Xifaxan treatment.  He will continue Lomotil.  He is about to start chemotherapy again for metastatic lung cancer now found in the right lung.   Please make him an appointment next available (I think that would be November) to see me.

## 2022-12-20 NOTE — Telephone Encounter (Signed)
Scheduled OV with patient for next available with Dr. Leone Payor on 03/21/23 at 9:30 am.

## 2022-12-21 ENCOUNTER — Telehealth: Payer: Self-pay | Admitting: Family Medicine

## 2022-12-21 ENCOUNTER — Inpatient Hospital Stay: Payer: Medicare Other | Attending: Internal Medicine

## 2022-12-21 DIAGNOSIS — Z9221 Personal history of antineoplastic chemotherapy: Secondary | ICD-10-CM | POA: Diagnosis not present

## 2022-12-21 DIAGNOSIS — D72829 Elevated white blood cell count, unspecified: Secondary | ICD-10-CM | POA: Insufficient documentation

## 2022-12-21 DIAGNOSIS — Z79899 Other long term (current) drug therapy: Secondary | ICD-10-CM | POA: Diagnosis not present

## 2022-12-21 DIAGNOSIS — C3412 Malignant neoplasm of upper lobe, left bronchus or lung: Secondary | ICD-10-CM | POA: Insufficient documentation

## 2022-12-21 DIAGNOSIS — Z87891 Personal history of nicotine dependence: Secondary | ICD-10-CM | POA: Insufficient documentation

## 2022-12-21 DIAGNOSIS — Z7982 Long term (current) use of aspirin: Secondary | ICD-10-CM | POA: Diagnosis not present

## 2022-12-21 DIAGNOSIS — R000 Tachycardia, unspecified: Secondary | ICD-10-CM | POA: Diagnosis not present

## 2022-12-21 DIAGNOSIS — Z5111 Encounter for antineoplastic chemotherapy: Secondary | ICD-10-CM | POA: Diagnosis not present

## 2022-12-21 DIAGNOSIS — C771 Secondary and unspecified malignant neoplasm of intrathoracic lymph nodes: Secondary | ICD-10-CM | POA: Diagnosis not present

## 2022-12-21 DIAGNOSIS — C3431 Malignant neoplasm of lower lobe, right bronchus or lung: Secondary | ICD-10-CM

## 2022-12-21 LAB — CBC WITH DIFFERENTIAL (CANCER CENTER ONLY)
Abs Immature Granulocytes: 0.03 10*3/uL (ref 0.00–0.07)
Basophils Absolute: 0 10*3/uL (ref 0.0–0.1)
Basophils Relative: 0 %
Eosinophils Absolute: 0.1 10*3/uL (ref 0.0–0.5)
Eosinophils Relative: 1 %
HCT: 37.8 % — ABNORMAL LOW (ref 39.0–52.0)
Hemoglobin: 12.1 g/dL — ABNORMAL LOW (ref 13.0–17.0)
Immature Granulocytes: 0 %
Lymphocytes Relative: 6 %
Lymphs Abs: 0.5 10*3/uL — ABNORMAL LOW (ref 0.7–4.0)
MCH: 28.7 pg (ref 26.0–34.0)
MCHC: 32 g/dL (ref 30.0–36.0)
MCV: 89.8 fL (ref 80.0–100.0)
Monocytes Absolute: 0.4 10*3/uL (ref 0.1–1.0)
Monocytes Relative: 5 %
Neutro Abs: 7.7 10*3/uL (ref 1.7–7.7)
Neutrophils Relative %: 88 %
Platelet Count: 190 10*3/uL (ref 150–400)
RBC: 4.21 MIL/uL — ABNORMAL LOW (ref 4.22–5.81)
RDW: 15.7 % — ABNORMAL HIGH (ref 11.5–15.5)
WBC Count: 8.8 10*3/uL (ref 4.0–10.5)
nRBC: 0 % (ref 0.0–0.2)

## 2022-12-21 LAB — CMP (CANCER CENTER ONLY)
ALT: 33 U/L (ref 0–44)
AST: 28 U/L (ref 15–41)
Albumin: 4 g/dL (ref 3.5–5.0)
Alkaline Phosphatase: 109 U/L (ref 38–126)
Anion gap: 6 (ref 5–15)
BUN: 26 mg/dL — ABNORMAL HIGH (ref 8–23)
CO2: 28 mmol/L (ref 22–32)
Calcium: 9.9 mg/dL (ref 8.9–10.3)
Chloride: 104 mmol/L (ref 98–111)
Creatinine: 0.77 mg/dL (ref 0.61–1.24)
GFR, Estimated: >60 mL/min (ref >60)
Glucose, Bld: 108 mg/dL — ABNORMAL HIGH (ref 70–99)
Potassium: 4 mmol/L (ref 3.5–5.1)
Sodium: 138 mmol/L (ref 135–145)
Total Bilirubin: 0.4 mg/dL (ref 0.3–1.2)
Total Protein: 7.4 g/dL (ref 6.5–8.1)

## 2022-12-21 NOTE — Telephone Encounter (Signed)
Alice with [employer] called to confirm receipt of fax from [facility]. Fax was located in MD's eFax folder (received on 12/19/22) Fulton Mole was informed it can take 3 - 5 business days for turn-around.  Alice:  [phone]

## 2022-12-21 NOTE — Telephone Encounter (Signed)
Called [employer] they are re-faxing the paperwork over to [place]

## 2022-12-25 ENCOUNTER — Ambulatory Visit: Payer: Medicare Other | Admitting: Nutrition

## 2022-12-25 ENCOUNTER — Inpatient Hospital Stay: Payer: Medicare Other

## 2022-12-25 NOTE — Progress Notes (Signed)
Contacted patient's wife by telephone to follow-up on tube feeding.  During chart review, patient reported he was no longer using his feeding tube and had been able to increase his oral intake.  Appears he is also drinking some nutrition supplements.  Noted formula for tube feeding has been discontinued.  Unfortunately I was unable to reach patient's wife but I did leave a message with my name and follow-up number for her to reach out if there are any needs at this time.

## 2022-12-27 ENCOUNTER — Telehealth: Payer: Medicare Other | Admitting: Family Medicine

## 2022-12-27 NOTE — Telephone Encounter (Signed)
Dr. Salomon Fick looked over forms, GI should be filling out the forms, faxed over to GI

## 2022-12-27 NOTE — Telephone Encounter (Signed)
Checking on progress of form faxed on 12/21/22 requesting order for enteral nutrition

## 2022-12-27 NOTE — Telephone Encounter (Signed)
Called [employer] and made them aware

## 2022-12-28 ENCOUNTER — Inpatient Hospital Stay: Payer: Medicare Other

## 2022-12-28 DIAGNOSIS — C3431 Malignant neoplasm of lower lobe, right bronchus or lung: Secondary | ICD-10-CM

## 2022-12-28 DIAGNOSIS — C3412 Malignant neoplasm of upper lobe, left bronchus or lung: Secondary | ICD-10-CM | POA: Diagnosis not present

## 2022-12-28 DIAGNOSIS — Z9221 Personal history of antineoplastic chemotherapy: Secondary | ICD-10-CM | POA: Diagnosis not present

## 2022-12-28 DIAGNOSIS — Z5111 Encounter for antineoplastic chemotherapy: Secondary | ICD-10-CM | POA: Diagnosis not present

## 2022-12-28 DIAGNOSIS — C771 Secondary and unspecified malignant neoplasm of intrathoracic lymph nodes: Secondary | ICD-10-CM | POA: Diagnosis not present

## 2022-12-28 DIAGNOSIS — Z87891 Personal history of nicotine dependence: Secondary | ICD-10-CM | POA: Diagnosis not present

## 2022-12-28 DIAGNOSIS — D72829 Elevated white blood cell count, unspecified: Secondary | ICD-10-CM | POA: Diagnosis not present

## 2022-12-28 DIAGNOSIS — Z7982 Long term (current) use of aspirin: Secondary | ICD-10-CM | POA: Diagnosis not present

## 2022-12-28 DIAGNOSIS — Z79899 Other long term (current) drug therapy: Secondary | ICD-10-CM | POA: Diagnosis not present

## 2022-12-28 DIAGNOSIS — R000 Tachycardia, unspecified: Secondary | ICD-10-CM | POA: Diagnosis not present

## 2022-12-28 LAB — CBC WITH DIFFERENTIAL (CANCER CENTER ONLY)
Abs Immature Granulocytes: 0.07 10*3/uL (ref 0.00–0.07)
Basophils Absolute: 0 10*3/uL (ref 0.0–0.1)
Basophils Relative: 0 %
Eosinophils Absolute: 0 10*3/uL (ref 0.0–0.5)
Eosinophils Relative: 0 %
HCT: 38.1 % — ABNORMAL LOW (ref 39.0–52.0)
Hemoglobin: 12.4 g/dL — ABNORMAL LOW (ref 13.0–17.0)
Immature Granulocytes: 0 %
Lymphocytes Relative: 3 %
Lymphs Abs: 0.4 10*3/uL — ABNORMAL LOW (ref 0.7–4.0)
MCH: 28.8 pg (ref 26.0–34.0)
MCHC: 32.5 g/dL (ref 30.0–36.0)
MCV: 88.4 fL (ref 80.0–100.0)
Monocytes Absolute: 0.5 10*3/uL (ref 0.1–1.0)
Monocytes Relative: 3 %
Neutro Abs: 14.9 10*3/uL — ABNORMAL HIGH (ref 1.7–7.7)
Neutrophils Relative %: 94 %
Platelet Count: 238 10*3/uL (ref 150–400)
RBC: 4.31 MIL/uL (ref 4.22–5.81)
RDW: 15.3 % (ref 11.5–15.5)
WBC Count: 15.9 10*3/uL — ABNORMAL HIGH (ref 4.0–10.5)
nRBC: 0 % (ref 0.0–0.2)

## 2022-12-28 LAB — CMP (CANCER CENTER ONLY)
ALT: 39 U/L (ref 0–44)
AST: 28 U/L (ref 15–41)
Albumin: 3.9 g/dL (ref 3.5–5.0)
Alkaline Phosphatase: 114 U/L (ref 38–126)
Anion gap: 8 (ref 5–15)
BUN: 29 mg/dL — ABNORMAL HIGH (ref 8–23)
CO2: 27 mmol/L (ref 22–32)
Calcium: 9.9 mg/dL (ref 8.9–10.3)
Chloride: 103 mmol/L (ref 98–111)
Creatinine: 0.68 mg/dL (ref 0.61–1.24)
GFR, Estimated: >60 mL/min (ref >60)
Glucose, Bld: 136 mg/dL — ABNORMAL HIGH (ref 70–99)
Potassium: 3.8 mmol/L (ref 3.5–5.1)
Sodium: 138 mmol/L (ref 135–145)
Total Bilirubin: 0.5 mg/dL (ref 0.3–1.2)
Total Protein: 7.4 g/dL (ref 6.5–8.1)

## 2022-12-31 MED FILL — Fosaprepitant Dimeglumine For IV Infusion 150 MG (Base Eq): INTRAVENOUS | Qty: 5 | Status: AC

## 2022-12-31 MED FILL — Dexamethasone Sodium Phosphate Inj 100 MG/10ML: INTRAMUSCULAR | Qty: 1 | Status: AC

## 2023-01-01 ENCOUNTER — Inpatient Hospital Stay: Payer: Medicare Other

## 2023-01-01 ENCOUNTER — Other Ambulatory Visit: Payer: Self-pay

## 2023-01-01 VITALS — BP 117/75 | HR 91 | Temp 98.5°F | Resp 16 | Wt 138.0 lb

## 2023-01-01 DIAGNOSIS — C771 Secondary and unspecified malignant neoplasm of intrathoracic lymph nodes: Secondary | ICD-10-CM | POA: Diagnosis not present

## 2023-01-01 DIAGNOSIS — Z79899 Other long term (current) drug therapy: Secondary | ICD-10-CM | POA: Diagnosis not present

## 2023-01-01 DIAGNOSIS — C3492 Malignant neoplasm of unspecified part of left bronchus or lung: Secondary | ICD-10-CM

## 2023-01-01 DIAGNOSIS — D72829 Elevated white blood cell count, unspecified: Secondary | ICD-10-CM | POA: Diagnosis not present

## 2023-01-01 DIAGNOSIS — Z87891 Personal history of nicotine dependence: Secondary | ICD-10-CM | POA: Diagnosis not present

## 2023-01-01 DIAGNOSIS — Z5111 Encounter for antineoplastic chemotherapy: Secondary | ICD-10-CM | POA: Diagnosis not present

## 2023-01-01 DIAGNOSIS — R000 Tachycardia, unspecified: Secondary | ICD-10-CM | POA: Diagnosis not present

## 2023-01-01 DIAGNOSIS — Z7982 Long term (current) use of aspirin: Secondary | ICD-10-CM | POA: Diagnosis not present

## 2023-01-01 DIAGNOSIS — C3431 Malignant neoplasm of lower lobe, right bronchus or lung: Secondary | ICD-10-CM

## 2023-01-01 DIAGNOSIS — Z9221 Personal history of antineoplastic chemotherapy: Secondary | ICD-10-CM | POA: Diagnosis not present

## 2023-01-01 DIAGNOSIS — C3412 Malignant neoplasm of upper lobe, left bronchus or lung: Secondary | ICD-10-CM | POA: Diagnosis not present

## 2023-01-01 LAB — CMP (CANCER CENTER ONLY)
ALT: 38 U/L (ref 0–44)
AST: 28 U/L (ref 15–41)
Albumin: 3.7 g/dL (ref 3.5–5.0)
Alkaline Phosphatase: 111 U/L (ref 38–126)
Anion gap: 10 (ref 5–15)
BUN: 34 mg/dL — ABNORMAL HIGH (ref 8–23)
CO2: 26 mmol/L (ref 22–32)
Calcium: 9.5 mg/dL (ref 8.9–10.3)
Chloride: 100 mmol/L (ref 98–111)
Creatinine: 0.76 mg/dL (ref 0.61–1.24)
GFR, Estimated: >60 mL/min (ref >60)
Glucose, Bld: 115 mg/dL — ABNORMAL HIGH (ref 70–99)
Potassium: 3.7 mmol/L (ref 3.5–5.1)
Sodium: 136 mmol/L (ref 135–145)
Total Bilirubin: 0.4 mg/dL (ref 0.3–1.2)
Total Protein: 7.2 g/dL (ref 6.5–8.1)

## 2023-01-01 LAB — CBC WITH DIFFERENTIAL (CANCER CENTER ONLY)
Abs Immature Granulocytes: 0.06 10*3/uL (ref 0.00–0.07)
Basophils Absolute: 0 10*3/uL (ref 0.0–0.1)
Basophils Relative: 0 %
Eosinophils Absolute: 0 10*3/uL (ref 0.0–0.5)
Eosinophils Relative: 0 %
HCT: 34.6 % — ABNORMAL LOW (ref 39.0–52.0)
Hemoglobin: 11.5 g/dL — ABNORMAL LOW (ref 13.0–17.0)
Immature Granulocytes: 0 %
Lymphocytes Relative: 4 %
Lymphs Abs: 0.6 10*3/uL — ABNORMAL LOW (ref 0.7–4.0)
MCH: 28.9 pg (ref 26.0–34.0)
MCHC: 33.2 g/dL (ref 30.0–36.0)
MCV: 86.9 fL (ref 80.0–100.0)
Monocytes Absolute: 0.5 10*3/uL (ref 0.1–1.0)
Monocytes Relative: 4 %
Neutro Abs: 13.5 10*3/uL — ABNORMAL HIGH (ref 1.7–7.7)
Neutrophils Relative %: 92 %
Platelet Count: 228 10*3/uL (ref 150–400)
RBC: 3.98 MIL/uL — ABNORMAL LOW (ref 4.22–5.81)
RDW: 15.2 % (ref 11.5–15.5)
WBC Count: 14.7 10*3/uL — ABNORMAL HIGH (ref 4.0–10.5)
nRBC: 0 % (ref 0.0–0.2)

## 2023-01-01 MED ORDER — SODIUM CHLORIDE 0.9 % IV SOLN
150.0000 mg | Freq: Once | INTRAVENOUS | Status: AC
  Administered 2023-01-01: 150 mg via INTRAVENOUS
  Filled 2023-01-01: qty 150

## 2023-01-01 MED ORDER — SODIUM CHLORIDE 0.9 % IV SOLN: Freq: Once | INTRAVENOUS | Status: AC

## 2023-01-01 MED ORDER — PALONOSETRON HCL INJECTION 0.25 MG/5ML
0.2500 mg | Freq: Once | INTRAVENOUS | Status: AC
  Administered 2023-01-01: 0.25 mg via INTRAVENOUS
  Filled 2023-01-01: qty 5

## 2023-01-01 MED ORDER — SODIUM CHLORIDE 0.9 % IV SOLN
10.0000 mg | Freq: Once | INTRAVENOUS | Status: AC
  Administered 2023-01-01: 10 mg via INTRAVENOUS
  Filled 2023-01-01: qty 10

## 2023-01-01 MED ORDER — SODIUM CHLORIDE 0.9 % IV SOLN
422.5000 mg | Freq: Once | INTRAVENOUS | Status: AC
  Administered 2023-01-01: 420 mg via INTRAVENOUS
  Filled 2023-01-01: qty 42

## 2023-01-01 MED ORDER — SODIUM CHLORIDE 0.9 % IV SOLN
500.0000 mg/m2 | Freq: Once | INTRAVENOUS | Status: AC
  Administered 2023-01-01: 900 mg via INTRAVENOUS
  Filled 2023-01-01: qty 20

## 2023-01-01 NOTE — Patient Instructions (Signed)
[facility]  Discharge Instructions: Thank you for choosing [facility] to provide your oncology and hematology care.   If you have a lab appointment with the Cancer Center, please go directly to the Cancer Center and check in at the registration area.   Wear comfortable clothing and clothing appropriate for easy access to any Portacath or PICC line.   We strive to give you quality time with your provider. You may need to reschedule your appointment if you arrive late (15 or more minutes).  Arriving late affects you and other patients whose appointments are after yours.  Also, if you miss three or more appointments without notifying the office, you may be dismissed from the clinic at the provider's discretion.      For prescription refill requests, have your pharmacy contact our office and allow 72 hours for refills to be completed.    Today you received the following chemotherapy and/or immunotherapy agents: Alimta and Carboplatin      To help prevent nausea and vomiting after your treatment, we encourage you to take your nausea medication as directed.  BELOW ARE SYMPTOMS THAT SHOULD BE REPORTED IMMEDIATELY: *FEVER GREATER THAN 100.4 F (38 C) OR HIGHER *CHILLS OR SWEATING *NAUSEA AND VOMITING THAT IS NOT CONTROLLED WITH YOUR NAUSEA MEDICATION *UNUSUAL SHORTNESS OF BREATH *UNUSUAL BRUISING OR BLEEDING *URINARY PROBLEMS (pain or burning when urinating, or frequent urination) *BOWEL PROBLEMS (unusual diarrhea, constipation, pain near the anus) TENDERNESS IN MOUTH AND THROAT WITH OR WITHOUT PRESENCE OF ULCERS (sore throat, sores in mouth, or a toothache) UNUSUAL RASH, SWELLING OR PAIN  UNUSUAL VAGINAL DISCHARGE OR ITCHING   Items with * indicate a potential emergency and should be followed up as soon as possible or go to the Emergency Department if any problems should occur.  Please show the CHEMOTHERAPY ALERT CARD or IMMUNOTHERAPY ALERT  CARD at check-in to the Emergency Department and triage nurse.  Should you have questions after your visit or need to cancel or reschedule your appointment, please contact [facility]  Dept: [phone]  and follow the prompts.  Office hours are 8:00 a.m. to 4:30 p.m. Monday - Friday. Please note that voicemails left after 4:00 p.m. may not be returned until the following business day.  We are closed weekends and major holidays. You have access to a nurse at all times for urgent questions. Please call the main number to the clinic Dept: [phone] and follow the prompts.   For any non-urgent questions, you may also contact your provider using MyChart. We now offer e-Visits for anyone 61 and older to request care online for non-urgent symptoms. For details visit [email or web address].   Also download the MyChart app! Go to the app store, search "MyChart", open the app, select [facility], and log in with your MyChart username and password.

## 2023-01-03 ENCOUNTER — Inpatient Hospital Stay: Payer: Medicare Other

## 2023-01-03 ENCOUNTER — Other Ambulatory Visit: Payer: Self-pay

## 2023-01-03 ENCOUNTER — Telehealth: Payer: Self-pay | Admitting: Internal Medicine

## 2023-01-03 ENCOUNTER — Ambulatory Visit (HOSPITAL_COMMUNITY)
Admission: RE | Admit: 2023-01-03 | Discharge: 2023-01-03 | Disposition: A | Discharge status: Home / Self Care | Payer: Medicare Other | Source: Ambulatory Visit | Attending: Physician Assistant | Admitting: Physician Assistant

## 2023-01-03 ENCOUNTER — Telehealth: Payer: Self-pay

## 2023-01-03 ENCOUNTER — Inpatient Hospital Stay (HOSPITAL_BASED_OUTPATIENT_CLINIC_OR_DEPARTMENT_OTHER): Payer: Medicare Other | Admitting: Physician Assistant

## 2023-01-03 VITALS — BP 121/67 | HR 105 | Temp 98.3°F | Resp 20 | Wt 140.0 lb

## 2023-01-03 DIAGNOSIS — R059 Cough, unspecified: Secondary | ICD-10-CM

## 2023-01-03 DIAGNOSIS — Z79899 Other long term (current) drug therapy: Secondary | ICD-10-CM | POA: Diagnosis not present

## 2023-01-03 DIAGNOSIS — C3490 Malignant neoplasm of unspecified part of unspecified bronchus or lung: Secondary | ICD-10-CM | POA: Diagnosis not present

## 2023-01-03 DIAGNOSIS — Z5111 Encounter for antineoplastic chemotherapy: Secondary | ICD-10-CM | POA: Diagnosis not present

## 2023-01-03 DIAGNOSIS — R000 Tachycardia, unspecified: Secondary | ICD-10-CM | POA: Diagnosis not present

## 2023-01-03 DIAGNOSIS — Z87891 Personal history of nicotine dependence: Secondary | ICD-10-CM | POA: Diagnosis not present

## 2023-01-03 DIAGNOSIS — C771 Secondary and unspecified malignant neoplasm of intrathoracic lymph nodes: Secondary | ICD-10-CM | POA: Diagnosis not present

## 2023-01-03 DIAGNOSIS — R918 Other nonspecific abnormal finding of lung field: Secondary | ICD-10-CM | POA: Diagnosis not present

## 2023-01-03 DIAGNOSIS — Z9221 Personal history of antineoplastic chemotherapy: Secondary | ICD-10-CM | POA: Diagnosis not present

## 2023-01-03 DIAGNOSIS — C3492 Malignant neoplasm of unspecified part of left bronchus or lung: Secondary | ICD-10-CM | POA: Insufficient documentation

## 2023-01-03 DIAGNOSIS — C3412 Malignant neoplasm of upper lobe, left bronchus or lung: Secondary | ICD-10-CM | POA: Diagnosis not present

## 2023-01-03 DIAGNOSIS — Z7982 Long term (current) use of aspirin: Secondary | ICD-10-CM | POA: Diagnosis not present

## 2023-01-03 DIAGNOSIS — R042 Hemoptysis: Secondary | ICD-10-CM | POA: Diagnosis not present

## 2023-01-03 DIAGNOSIS — D72829 Elevated white blood cell count, unspecified: Secondary | ICD-10-CM | POA: Diagnosis not present

## 2023-01-03 LAB — CBC WITH DIFFERENTIAL (CANCER CENTER ONLY)
Abs Immature Granulocytes: 0.04 10*3/uL (ref 0.00–0.07)
Basophils Absolute: 0 10*3/uL (ref 0.0–0.1)
Basophils Relative: 0 %
Eosinophils Absolute: 0 10*3/uL (ref 0.0–0.5)
Eosinophils Relative: 0 %
HCT: 34.3 % — ABNORMAL LOW (ref 39.0–52.0)
Hemoglobin: 11.1 g/dL — ABNORMAL LOW (ref 13.0–17.0)
Immature Granulocytes: 0 %
Lymphocytes Relative: 3 %
Lymphs Abs: 0.4 10*3/uL — ABNORMAL LOW (ref 0.7–4.0)
MCH: 29 pg (ref 26.0–34.0)
MCHC: 32.4 g/dL (ref 30.0–36.0)
MCV: 89.6 fL (ref 80.0–100.0)
Monocytes Absolute: 0.1 10*3/uL (ref 0.1–1.0)
Monocytes Relative: 1 %
Neutro Abs: 10 10*3/uL — ABNORMAL HIGH (ref 1.7–7.7)
Neutrophils Relative %: 96 %
Platelet Count: 218 10*3/uL (ref 150–400)
RBC: 3.83 MIL/uL — ABNORMAL LOW (ref 4.22–5.81)
RDW: 15.2 % (ref 11.5–15.5)
WBC Count: 10.6 10*3/uL — ABNORMAL HIGH (ref 4.0–10.5)
nRBC: 0 % (ref 0.0–0.2)

## 2023-01-03 LAB — SAMPLE TO BLOOD BANK

## 2023-01-03 LAB — CMP (CANCER CENTER ONLY)
ALT: 28 U/L (ref 0–44)
AST: 19 U/L (ref 15–41)
Albumin: 3.7 g/dL (ref 3.5–5.0)
Alkaline Phosphatase: 97 U/L (ref 38–126)
Anion gap: 7 (ref 5–15)
BUN: 37 mg/dL — ABNORMAL HIGH (ref 8–23)
CO2: 27 mmol/L (ref 22–32)
Calcium: 9.4 mg/dL (ref 8.9–10.3)
Chloride: 104 mmol/L (ref 98–111)
Creatinine: 0.74 mg/dL (ref 0.61–1.24)
GFR, Estimated: >60 mL/min (ref >60)
Glucose, Bld: 113 mg/dL — ABNORMAL HIGH (ref 70–99)
Potassium: 3.8 mmol/L (ref 3.5–5.1)
Sodium: 138 mmol/L (ref 135–145)
Total Bilirubin: 0.3 mg/dL (ref 0.3–1.2)
Total Protein: 7 g/dL (ref 6.5–8.1)

## 2023-01-03 MED ORDER — AZITHROMYCIN 250 MG PO TABS: ORAL_TABLET | ORAL | 0 refills | Status: AC

## 2023-01-03 NOTE — Telephone Encounter (Signed)
Patient called [facility] reporting new onset "left lung pain". States he woke up this morning not feeling well and coughed up "some red stuff". Asking to be seen today to try to figure out what's going on. Patient had treatment on 9/17. Namon Cirri, PA-C, made aware and orders for patient to have chest xray, cbc/cmp/sample to bb entered. [facility] appointment made, patient verbalized understanding of time and location of all appointments.

## 2023-01-03 NOTE — Telephone Encounter (Signed)
I received a request to reorder tube feeds but my last understanding was he was not using them anymore.  Please ask/clarify and let me know if he needs this

## 2023-01-03 NOTE — Progress Notes (Signed)
Symptom Management Consult Note [facility]    Patient Care Team: [name], MD as PCP - General (Family Medicine) Si Gaul, MD as Consulting Physician (Oncology) Verner Chol, [facility] (Inactive) as Pharmacist (Pharmacist) Iva Boop, MD as Consulting Physician (Gastroenterology) Josephine Igo, DO as Consulting Physician (Pulmonary Disease) Berneice Heinrich Delbert Phenix., MD as Consulting Physician (Urology) Karie Soda, MD as Consulting Physician (General Surgery) Pricilla Riffle, MD as Consulting Physician (Cardiology)    Name / MRN / DOB: Jesse [name]  [id number]  1951-04-08   Date of visit: 01/03/2023   Chief Complaint/Reason for visit: cough   Current Therapy:  Systemic chemotherapy with carboplatin for AUC of 5 and Alimta 500 Mg/M2 every 3 weeks   Last treatment:  Day 1   Cycle 1 on 01/01/23   ASSESSMENT & PLAN: Patient is a 72 y.o. male with oncologic history of recurrent non-small cell lung cancer, adenocarcinoma followed by Dr. Arbutus Ped.  I have viewed most recent oncology note and lab work.    #Recurrent non-small cell lung cancer, adenocarcinoma  - Next appointment with oncologist is 01/22/23.   #Cough -Patient afebrile.  He is noted to be tachycardic to 105 which per chart review is consistent with vitals over the last 2 months. PE with faint expiratory wheeze on left. -CBC showing very mild leukocytosis 10.6, stable hemoglobin 11.1.  CMP overall unremarkable. -Chest xray concerning for pneumonia. Will treat with zpak as he recently completed course of doxycyline and trying to avoid fluoroquinolones with his age.  Discussed plan with Dr. Shirline Frees who agrees.  Strict ED precautions discussed should symptoms worsen.    Heme/Onc History: Oncology History  Adenocarcinoma of left lung, stage 2 (HCC)  12/31/2019 Initial Diagnosis   Adenocarcinoma of left lung, stage 2 (HCC)   02/05/2020 - 04/07/2020 Chemotherapy          05/30/2020 Cancer Staging   Staging form: Lung, AJCC 8th Edition - Clinical: Stage IIB (cT1a, cN1, cM0) - Signed by Si Gaul, MD on 05/30/2020   05/14/2022 - 05/28/2022 Chemotherapy   Patient is on Treatment Plan : LUNG Carboplatin + Paclitaxel + XRT q7d     01/01/2023 -  Chemotherapy   Patient is on Treatment Plan : LUNG Pemetrexed + Carboplatin q21d     Adenocarcinoma of right lung, stage 2 (HCC) (Resolved)  01/28/2020 Initial Diagnosis   Adenocarcinoma of right lung, stage 2 (HCC)   05/30/2020 Cancer Staging   Staging form: Lung, AJCC 8th Edition - Clinical: Stage IIB (cT1a, cN1, cM0) - Signed by Si Gaul, MD on 05/30/2020   Malignant neoplasm of lung (HCC)  04/30/2022 Initial Diagnosis   Recurrent non-small cell lung cancer (HCC)   04/30/2022 Cancer Staging   Staging form: Lung, AJCC 8th Edition - Clinical: cT0, cN2, cM0 - Signed by Si Gaul, MD on 04/30/2022   01/01/2023 -  Chemotherapy   Patient is on Treatment Plan : LUNG Pemetrexed + Carboplatin q21d         Interval history-: Jesse [name] is a 72 y.o. male with oncologic history as above presenting to [facility] today with chief complaint of cough.  He is accompanied by his spouse who provides additional history.  Patient recently restarted chemotherapy and had his first treatment with carboplatin and Alimta x 2 days ago.  Patient states since the infusion he has felt fatigued although this is not a new problem for him.  He has had increased cough over the  last 24 hours and production of white phlegm.  Patient states this morning over course of 2 hours he had 6 coughing fits and coughed up what he describes as pink jelly ball the size of his pinky tip.  He felt feverish last night which prompted him to check his temperature.  Tmax has been 99.0.  Patient is also reporting a dull pain over his left lung.  Denies any trauma to the area.  No over-the-counter medications taken prior to arrival.  Spouse adds that  patient was treated for possible pneumonia at the end of last month with a 10-day course of doxycycline.  Patient is not anticoagulated.      ROS  All other systems are reviewed and are negative for acute change except as noted in the HPI.    No Known Allergies   Past Medical History:  Diagnosis Date   Anemia    Cancer (HCC)    bladder cancer   COPD (chronic obstructive pulmonary disease) (HCC)    mild    DIVERTICULOSIS, COLON 01/14/2007   Qualifier: Diagnosis of  By: Marcelyn Ditty RN, Katy Fitch    GASTRITIS, CHRONIC 06/01/2004   Qualifier: Diagnosis of  By: Creta Levin CMA (AAMA), Robin     GERD 01/09/2007   Qualifier: Diagnosis of  By: Tawanna Cooler, RN, Ellen     Heart murmur    as a child; no murmur heard 12/14/19   History of radiation therapy    Mediastinum- 05/15/22-06/28/22-Dr. Antony Blackbird   HYPERLIPIDEMIA 01/09/2007   Qualifier: Diagnosis of  By: Tawanna Cooler RN, Alvino Chapel     Hypertension    Lung mass    left upper nodule   Macular degeneration    right eye   NEOPLASM, MALIGNANT, BLADDER, HX OF 2002   Qualifier: Diagnosis of  By: Creta Levin CMA (AAMA), Robin  / no chemo or radiation   OSTEOARTHRITIS 01/14/2007   Qualifier: Diagnosis of  By: Marcelyn Ditty, RN, Katy Fitch - knees   Rectal mass    Reflux esophagitis 06/01/2004   Qualifier: Diagnosis of  By: Creta Levin CMA (AAMA), Robin     Stroke [facility])    TOBACCO USER 02/16/2009   Qualifier: Diagnosis of  By: Amador Cunas  MD, Janett Labella    VITAMIN D DEFICIENCY 06/03/2007   Qualifier: Diagnosis of  By: Amador Cunas  MD, Janett Labella      Past Surgical History:  Procedure Laterality Date   BIOPSY  06/18/2022   Procedure: BIOPSY;  Surgeon: Shellia Cleverly, DO;  Location: [facility];  Service: Gastroenterology;;   BREAST BIOPSY Left 03/16/2022   [place] LT RADIOACTIVE SEED LOC 03/16/2022 [facility]   BRONCHIAL BIOPSY  10/13/2019   Procedure: BRONCHIAL BIOPSIES;  Surgeon: Josephine Igo, DO;  [facility]: [facility];  Service: Pulmonary;;   BRONCHIAL  BIOPSY  12/04/2022   Procedure: BRONCHIAL BIOPSIES;  Surgeon: Josephine Igo, DO;  [facility];  Service: Pulmonary;;   BRONCHIAL BRUSHINGS  10/13/2019   Procedure: BRONCHIAL BRUSHINGS;  Surgeon: Josephine Igo, DO;  [facility];  Service: Pulmonary;;   BRONCHIAL NEEDLE ASPIRATION BIOPSY  10/13/2019   Procedure: BRONCHIAL NEEDLE ASPIRATION BIOPSIES;  Surgeon: Josephine Igo, DO;  [facility];  Service: Pulmonary;;   BRONCHIAL NEEDLE ASPIRATION BIOPSY  04/11/2022   Procedure: BRONCHIAL NEEDLE ASPIRATION BIOPSIES;  Surgeon: Josephine Igo, DO;  [facility];  Service: Cardiopulmonary;;   BRONCHIAL NEEDLE ASPIRATION BIOPSY  12/04/2022   Procedure: BRONCHIAL NEEDLE ASPIRATION BIOPSIES;  Surgeon: Josephine Igo, DO;  [facility];  Service: Pulmonary;;   BRONCHIAL WASHINGS  10/13/2019   Procedure: BRONCHIAL WASHINGS;  Surgeon: Josephine Igo, DO;  [facility];  Service: Pulmonary;;   BRONCHIAL WASHINGS  12/04/2022   Procedure: BRONCHIAL WASHINGS;  Surgeon: Josephine Igo, DO;  [facility];  Service: Pulmonary;;   COLONOSCOPY     multiple   DIVERTING ILEOSTOMY N/A 12/14/2021   Procedure: DIVERTING ILEOSTOMY;  Surgeon: Karie Soda, MD;  [facility]: [facility];  Service: General;  Laterality: N/A;   ENDOBRONCHIAL ULTRASOUND Bilateral 04/11/2022   Procedure: ENDOBRONCHIAL ULTRASOUND;  Surgeon: Josephine Igo, DO;  [facility];  Service: Cardiopulmonary;  Laterality: Bilateral;   ESOPHAGEAL DILATION  06/18/2022   Procedure: ESOPHAGEAL DILATION;  Surgeon: Shellia Cleverly, DO;  [facility];  Service: Gastroenterology;;   ESOPHAGOGASTRODUODENOSCOPY (EGD) WITH PROPOFOL N/A 06/18/2022   Procedure: ESOPHAGOGASTRODUODENOSCOPY (EGD) WITH PROPOFOL;  Surgeon: Shellia Cleverly, DO;  [facility];  Service: Gastroenterology;  Laterality: N/A;  Severe dysphagia/odynophagia ?radiation esophagitis versus  oral candidiasis   FIDUCIAL MARKER PLACEMENT  12/04/2022   Procedure: FIDUCIAL MARKER PLACEMENT;  Surgeon: Josephine Igo, DO;  [facility];  Service: Pulmonary;;   ILEOSTOMY CLOSURE N/A 03/22/2022   Procedure: OPEN TAKEDOWN OF LOOP ILEOSTOMY;  Surgeon: Karie Soda, MD;  [facility];  Service: General;  Laterality: N/A;  GEN w/ERAS PATHWAY LOCAL   INTERCOSTAL NERVE BLOCK Left 12/16/2019   Procedure: INTERCOSTAL NERVE BLOCK;  Surgeon: Corliss Skains, MD;  [facility];  Service: Thoracic;  Laterality: Left;   IR FLUORO RM 30-60 MIN  07/02/2022   KNEE SURGERY  07/20/2008   bilat.   NASAL SEPTUM SURGERY  1995   NODE DISSECTION Left 12/16/2019   Procedure: NODE DISSECTION;  Surgeon: Corliss Skains, MD;  [facility];  Service: Thoracic;  Laterality: Left;   PEG PLACEMENT N/A 07/04/2022   Procedure: PERCUTANEOUS ENDOSCOPIC GASTROSTOMY (PEG) PLACEMENT;  Surgeon: Iva Boop, MD;  [facility];  Service: Gastroenterology;  Laterality: N/A;   RADIOACTIVE SEED GUIDED AXILLARY SENTINEL LYMPH NODE Left 03/20/2022   Procedure: RADIOACTIVE SEED GUIDED LEFT AXILLARY SENTINEL LYMPH NODE BIOPSY;  Surgeon: Harriette Bouillon, MD;  [facility];  Service: General;  Laterality: Left;   RECTAL EXAM UNDER ANESTHESIA N/A 03/22/2022   Procedure: ANORECTAL EXAMINATION UNDER ANESTHESIA;  Surgeon: Karie Soda, MD;  [facility];  Service: General;  Laterality: N/A;   TONSILLECTOMY     TRANSURETHRAL RESECTION OF BLADDER TUMOR  2002   UPPER GASTROINTESTINAL ENDOSCOPY     VIDEO BRONCHOSCOPY  04/11/2022   Procedure: VIDEO BRONCHOSCOPY;  Surgeon: Josephine Igo, DO;  [facility];  Service: Cardiopulmonary;;   VIDEO BRONCHOSCOPY WITH ENDOBRONCHIAL NAVIGATION N/A 10/13/2019   Procedure: VIDEO BRONCHOSCOPY WITH ENDOBRONCHIAL NAVIGATION;  Surgeon: Josephine Igo, DO;  [facility];  Service: Pulmonary;  Laterality: N/A;   WISDOM TOOTH EXTRACTION     XI  ROBOTIC ASSISTED LOWER ANTERIOR RESECTION N/A 12/14/2021   Procedure: XI ROBOTIC ASSISTED LOWER ANTERIOR ULTRA LOW RECTOSIGMOID RESECTION, COLOANAL HAND SEWN ANASTOMOSIS, AND BILATERAL TAP BLOCK;  Surgeon: Karie Soda, MD;  Location: [facility];  Service: General;  Laterality: N/A;    Social History   Socioeconomic History   Marital status: Married    Spouse name: [name]   Number of children: 3   Years of education: Not on file   Highest education level: 12th grade  Occupational History    Comment: semi retired  Tobacco Use   Smoking status: Former    Current packs/day: 0.00    Average packs/day: 0.5 packs/day for 40.0 years (20.0 ttl pk-yrs)    Types: Cigarettes    Start date: 09/21/1979    Quit date: 09/21/2019    Years since quitting: 3.2   Smokeless tobacco: Never  Vaping Use   Vaping status: Never Used  Substance and Sexual Activity   Alcohol use: Not Currently    Alcohol/week: 15.0 standard drinks of alcohol    Types: 15 Standard drinks or equivalent per week    Comment: wine/beer/martini   Drug use: Not Currently    Types: Marijuana    Comment: Smokes marijuana occasional, last use in 08/2019   Sexual activity: Not Currently  Other Topics Concern   Not on file  Social History Narrative   Married   3 children: 3 grandchildren; 3 great grandchildren   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   Enjoys yard work and Systems analyst   Social Determinants of Corporate investment banker Strain: Low Risk  (10/30/2022)   Overall Financial Resource Strain (CARDIA)    Difficulty of Paying Living Expenses: Not hard at all  Food Insecurity: No Food Insecurity (10/30/2022)   Hunger Vital Sign    Worried About Running Out of Food in the Last Year: Never true    Ran Out of Food in the Last Year: Never true  Transportation Needs: No Transportation Needs (10/30/2022)   PRAPARE - Administrator, Civil Service (Medical): No    Lack of Transportation (Non-Medical): No  Physical  Activity: Sufficiently Active (10/30/2022)   Exercise Vital Sign    Days of Exercise per Week: 5 days    Minutes of Exercise per Session: 30 min  Stress: No Stress Concern Present (10/30/2022)   [employer] of Occupational Health - Occupational Stress Questionnaire    Feeling of Stress : Not at all  Social Connections: Moderately Isolated (07/12/2020)   Social Connection and Isolation Panel [NHANES]    Frequency of Communication with Friends and Family: More than three times a week    Frequency of Social Gatherings with Friends and Family: More than three times a week    Attends Religious Services: Never    Database administrator or Organizations: No    Attends Banker Meetings: Never    Marital Status: Married  Catering manager Violence: Not At Risk (09/12/2022)   Humiliation, Afraid, Rape, and Kick questionnaire    Fear of Current or Ex-Partner: No    Emotionally Abused: No    Physically Abused: No    Sexually Abused: No    Family History  Problem Relation Age of Onset   Dementia Mother    Diabetes Father    Mental illness Father    Peripheral vascular disease Brother    Colon cancer Neg Hx    Esophageal cancer Neg Hx    Stomach cancer Neg Hx    Rectal cancer Neg Hx      Current Outpatient Medications:    azithromycin (ZITHROMAX Z-PAK) 250 MG tablet, Take 2 tablets (500 mg total) by mouth daily for 1 day, THEN 1 tablet (250 mg total) daily for 4 days., Disp: 6 each, Rfl: 0   acetaminophen (TYLENOL) 500 MG tablet, Take 1,000 mg by mouth every 8 (eight) hours as needed for moderate pain., Disp: , Rfl:    aspirin 81 MG chewable tablet, Chew 1 tablet (81 mg total) by mouth daily.,  Disp: , Rfl:    atorvastatin (LIPITOR) 40 MG tablet, Take 1/2 tablet (20 mg total) by mouth daily., Disp: 90 tablet, Rfl: 3   Cholecalciferol (VITAMIN D3 PO), Take 2,000 Units by mouth daily., Disp: , Rfl:    diphenoxylate-atropine (LOMOTIL) 2.5-0.025 MG tablet, Take 2 tablets by mouth  3 (three) times daily as needed for diarrhea or loose stools., Disp: 90 tablet, Rfl: 0   ferrous sulfate 325 (65 FE) MG EC tablet, Take 1 tablet (325 mg total) by mouth 2 (two) times daily., Disp: 60 tablet, Rfl: 1   folic acid (FOLVITE) 1 MG tablet, Take 1 tablet (1 mg total) by mouth daily., Disp: 30 tablet, Rfl: 4   lidocaine (XYLOCAINE) 5 % ointment, Apply 1 Application topically 3 (three) times daily as needed for mild pain or moderate pain., Disp: 35.44 g, Rfl: 3   loperamide (IMODIUM A-D) 2 MG tablet, Take 4 mg by mouth 4 (four) times daily as needed for diarrhea or loose stools., Disp: , Rfl:    mirtazapine (REMERON) 30 MG tablet, Take 30 mg by mouth at bedtime., Disp: , Rfl:    Multiple Vitamins-Minerals (PRESERVISION AREDS 2) CAPS, Take 1 capsule by mouth 2 (two) times daily., Disp: , Rfl:    OVER THE COUNTER MEDICATION, Take 1 tablet by mouth daily. PROBIOTICS, Disp: , Rfl:    pantoprazole (PROTONIX) 40 MG tablet, Take 1 tablet (40 mg total) by mouth 2 (two) times daily., Disp: 60 tablet, Rfl: 3   prochlorperazine (COMPAZINE) 10 MG tablet, Take 1 tablet (10 mg total) by mouth every 6 (six) hours as needed for nausea or vomiting., Disp: 30 tablet, Rfl: 0  PHYSICAL EXAM: ECOG FS:1 - Symptomatic but completely ambulatory    Vitals:   01/03/23 1226  BP: 121/67  Pulse: (!) 105  Resp: 20  Temp: 98.3 F (36.8 C)  TempSrc: Oral  SpO2: 98%  Weight: 140 lb (63.5 kg)   Physical Exam Vitals and nursing note reviewed.  Constitutional:      Appearance: He is not toxic-appearing.     Comments: Frail  HENT:     Head: Normocephalic.  Eyes:     Conjunctiva/sclera: Conjunctivae normal.  Cardiovascular:     Rate and Rhythm: Regular rhythm.     Pulses: Normal pulses.     Heart sounds: Normal heart sounds.     Comments: Heart rate ranging from 95-1 05 Pulmonary:     Effort: Pulmonary effort is normal.     Breath sounds: Wheezing present.     Comments: Expiratory wheezing heard on left  >right Abdominal:     General: There is no distension.     Comments: + PEG tube  Musculoskeletal:     Cervical back: Normal range of motion.  Skin:    General: Skin is warm and dry.  Neurological:     Mental Status: He is alert.        LABORATORY DATA: I have reviewed the data as listed    Latest Ref Rng & Units 01/03/2023   12:05 PM 01/01/2023    2:26 PM 12/28/2022    9:30 AM  CBC  WBC 4.0 - 10.5 K/uL 10.6  14.7  15.9   Hemoglobin 13.0 - 17.0 g/dL 54.0  98.1  19.1   Hematocrit 39.0 - 52.0 % 34.3  34.6  38.1   Platelets 150 - 400 K/uL 218  228  238         Latest Ref Rng & Units 01/03/2023  12:05 PM 01/01/2023    2:26 PM 12/28/2022    9:30 AM  CMP  Glucose 70 - 99 mg/dL 161  096  045   BUN 8 - 23 mg/dL 37  34  29   Creatinine 0.61 - 1.24 mg/dL 4.09  8.11  9.14   Sodium 135 - 145 mmol/L 138  136  138   Potassium 3.5 - 5.1 mmol/L 3.8  3.7  3.8   Chloride 98 - 111 mmol/L 104  100  103   CO2 22 - 32 mmol/L 27  26  27    Calcium 8.9 - 10.3 mg/dL 9.4  9.5  9.9   Total Protein 6.5 - 8.1 g/dL 7.0  7.2  7.4   Total Bilirubin 0.3 - 1.2 mg/dL 0.3  0.4  0.5   Alkaline Phos 38 - 126 U/L 97  111  114   AST 15 - 41 U/L 19  28  28    ALT 0 - 44 U/L 28  38  39        RADIOGRAPHIC STUDIES (from last 24 hours if applicable) I have personally reviewed the radiological images as listed and agreed with the findings in the report. DG Chest 2 View  Result Date: 01/03/2023 CLINICAL DATA:  Hemoptysis EXAM: CHEST - 2 VIEW COMPARISON:  Chest radiograph 12/04/2022, chest CT 12/03/2022 FINDINGS: The heart size is stable and within normal limits. The upper mediastinal contours are normal. Left suprahilar opacity and associated architectural distortion is unchanged. Patchy opacities in the right lower lobe are also unchanged. Opacities in the right midlung appear worsened compared to the prior radiograph. There is no pulmonary edema. There is no pleural effusion or pneumothorax There is no acute  osseous abnormality. IMPRESSION: 1. Worsened opacities in the right midlung compared to the study from 12/04/2022, concerning for infection. 2. Additional right basilar and left suprahilar opacities are unchanged compared to the prior radiograph and CT. Electronically Signed   By: Lesia Hausen M.D.   On: 01/03/2023 14:33        Visit Diagnosis: 1. Malignant neoplasm of unspecified part of unspecified bronchus or lung (HCC)   2. Cough, unspecified type      No orders of the defined types were placed in this encounter.   All questions were answered. The patient knows to call the clinic with any problems, questions or concerns. No barriers to learning was detected.  A total of more than 30 minutes were spent on this encounter with face-to-face time and non-face-to-face time, including preparing to see the patient, ordering tests and/or medications, counseling the patient and coordination of care as outlined above.    Thank you for allowing me to participate in the care of this patient.    Shanon Ace, PA-C Department of Hematology/Oncology [facility] Phone: [phone]  [phone]    01/03/2023 3:57 PM

## 2023-01-03 NOTE — Telephone Encounter (Signed)
RN called patient to review chest x-ray results. Patient verbalized understanding that results showed concern for pneumonia, he knows to continue taking prescribed antibiotics. Patient knows to call Dr. Asa Lente office or [facility] should symptoms not subside or worsen.

## 2023-01-03 NOTE — Telephone Encounter (Signed)
Spoke with patient & he is not currently using tube feeds. He said from his last discussion with Dr. Leone Payor, he thought tube feeds were just going to be ordered just in case d/t poor appetite with chemo. He has 2 more chemo sessions left.

## 2023-01-04 ENCOUNTER — Ambulatory Visit (HOSPITAL_COMMUNITY): Payer: Medicare Other | Attending: Cardiovascular Disease

## 2023-01-04 DIAGNOSIS — C3490 Malignant neoplasm of unspecified part of unspecified bronchus or lung: Secondary | ICD-10-CM | POA: Diagnosis not present

## 2023-01-04 DIAGNOSIS — I2584 Coronary atherosclerosis due to calcified coronary lesion: Secondary | ICD-10-CM | POA: Insufficient documentation

## 2023-01-04 DIAGNOSIS — I2510 Atherosclerotic heart disease of native coronary artery without angina pectoris: Secondary | ICD-10-CM | POA: Insufficient documentation

## 2023-01-04 DIAGNOSIS — Z8673 Personal history of transient ischemic attack (TIA), and cerebral infarction without residual deficits: Secondary | ICD-10-CM | POA: Insufficient documentation

## 2023-01-04 DIAGNOSIS — I10 Essential (primary) hypertension: Secondary | ICD-10-CM | POA: Diagnosis not present

## 2023-01-04 LAB — ECHOCARDIOGRAM COMPLETE
Area-P 1/2: 2.28 cm2
S' Lateral: 3.1 cm

## 2023-01-04 NOTE — Telephone Encounter (Signed)
Attempted to reach patient, line busy. Will try again at a later time.

## 2023-01-04 NOTE — Telephone Encounter (Signed)
Patient made aware & verbalized all understanding.

## 2023-01-04 NOTE — Telephone Encounter (Signed)
Please tell patient I communicated with the dietitian at the cancer center and should he need to restart tube feeds they can start with samples.  I am not going to order any at this time.

## 2023-01-06 LAB — FUNGUS CULTURE WITH STAIN

## 2023-01-06 LAB — FUNGAL ORGANISM REFLEX

## 2023-01-06 LAB — FUNGUS CULTURE RESULT

## 2023-01-07 NOTE — Progress Notes (Deleted)
Palliative Medicine [facility]  [phone] [phone]   Name: Jesse [name] Date: 01/07/2023 MRN: [id number]  DOB: Apr 29, 1950  Patient Care Team: [name], MD as PCP - General (Family Medicine) Si Gaul, MD as Consulting Physician (Oncology) Verner Chol, [facility] (Inactive) as Pharmacist (Pharmacist) Iva Boop, MD as Consulting Physician (Gastroenterology) Josephine Igo, DO as Consulting Physician (Pulmonary Disease) Berneice Heinrich Delbert Phenix., MD as Consulting Physician (Urology) Karie Soda, MD as Consulting Physician (General Surgery) Pricilla Riffle, MD as Consulting Physician (Cardiology)    INTERVAL HISTORY: Jesse [name] is a 72 y.o. 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 Previous medical history also includes hypertension, hyperlipidemia, GERD, anemia, COPD, and macular degeneration and a stroke during most recent hospitalization. Surgical history includes ileostomy and takedown. Peg tube placed (07/04/2022) for esophageal stricture/dysphagia. Palliative asked to see for symptom management and goals of care.   SOCIAL HISTORY:     reports that he quit smoking about 3 years ago. His smoking use included cigarettes. He started smoking about 43 years ago. He has a 20 pack-year smoking history. He has never used smokeless tobacco. He reports that he does not currently use alcohol after a past usage of about 15.0 standard drinks of alcohol per week. He reports that he does not currently use drugs after having used the following drugs: Marijuana.  ADVANCE DIRECTIVES:  Advanced directives on file  CODE STATUS: Full code  PAST MEDICAL HISTORY: Past Medical History:  Diagnosis Date   Anemia    Cancer (HCC)    bladder cancer   COPD (chronic obstructive pulmonary disease)  (HCC)    mild    DIVERTICULOSIS, COLON 01/14/2007   Qualifier: Diagnosis of  By: Marcelyn Ditty RN, Katy Fitch    GASTRITIS, CHRONIC 06/01/2004   Qualifier: Diagnosis of  By: Creta Levin CMA (AAMA), Robin     GERD 01/09/2007   Qualifier: Diagnosis of  By: Tawanna Cooler, RN, Ellen     Heart murmur    as a child; no murmur heard 12/14/19   History of radiation therapy    Mediastinum- 05/15/22-06/28/22-Dr. Antony Blackbird   HYPERLIPIDEMIA 01/09/2007   Qualifier: Diagnosis of  By: Everett Graff     Hypertension    Lung mass    left upper nodule   Macular degeneration    right eye   NEOPLASM, MALIGNANT, BLADDER, HX OF 2002   Qualifier: Diagnosis of  By: Creta Levin CMA (AAMA), Robin  / no chemo or radiation   OSTEOARTHRITIS 01/14/2007   Qualifier: Diagnosis of  By: Marcelyn Ditty, RN, Katy Fitch - knees   Rectal mass    Reflux esophagitis 06/01/2004   Qualifier: Diagnosis of  By: Creta Levin CMA (AAMA), Robin     Stroke [facility])    TOBACCO USER 02/16/2009   Qualifier: Diagnosis of  By: Amador Cunas  MD, Janett Labella    VITAMIN D DEFICIENCY 06/03/2007   Qualifier: Diagnosis of  By: Amador Cunas  MD, Janett Labella     ALLERGIES:  has No Known Allergies.  MEDICATIONS:  Current Outpatient Medications  Medication Sig Dispense Refill   acetaminophen (TYLENOL) 500 MG tablet Take 1,000 mg by mouth every 8 (eight) hours as needed for moderate pain.     aspirin 81 MG chewable tablet Chew 1 tablet (81 mg total) by mouth daily.     atorvastatin (LIPITOR) 40 MG  tablet Take 1/2 tablet (20 mg total) by mouth daily. 90 tablet 3   azithromycin (ZITHROMAX Z-PAK) 250 MG tablet Take 2 tablets (500 mg total) by mouth daily for 1 day, THEN 1 tablet (250 mg total) daily for 4 days. 6 each 0   Cholecalciferol (VITAMIN D3 PO) Take 2,000 Units by mouth daily.     diphenoxylate-atropine (LOMOTIL) 2.5-0.025 MG tablet Take 2 tablets by mouth 3 (three) times daily as needed for diarrhea or loose stools. 90 tablet 0   ferrous sulfate 325 (65 FE) MG EC tablet  Take 1 tablet (325 mg total) by mouth 2 (two) times daily. 60 tablet 1   folic acid (FOLVITE) 1 MG tablet Take 1 tablet (1 mg total) by mouth daily. 30 tablet 4   lidocaine (XYLOCAINE) 5 % ointment Apply 1 Application topically 3 (three) times daily as needed for mild pain or moderate pain. 35.44 g 3   loperamide (IMODIUM A-D) 2 MG tablet Take 4 mg by mouth 4 (four) times daily as needed for diarrhea or loose stools.     mirtazapine (REMERON) 30 MG tablet Take 30 mg by mouth at bedtime.     Multiple Vitamins-Minerals (PRESERVISION AREDS 2) CAPS Take 1 capsule by mouth 2 (two) times daily.     OVER THE COUNTER MEDICATION Take 1 tablet by mouth daily. PROBIOTICS     pantoprazole (PROTONIX) 40 MG tablet Take 1 tablet (40 mg total) by mouth 2 (two) times daily. 60 tablet 3   prochlorperazine (COMPAZINE) 10 MG tablet Take 1 tablet (10 mg total) by mouth every 6 (six) hours as needed for nausea or vomiting. 30 tablet 0   No current facility-administered medications for this visit.    VITAL SIGNS: There were no vitals taken for this visit. There were no vitals filed for this visit.  Estimated body mass index is 20.67 kg/m as calculated from the following:   Height as of 12/19/22: 5\' 9"  (1.753 m).   Weight as of 01/03/23: 140 lb (63.5 kg).   PERFORMANCE STATUS (ECOG) : 1 - Symptomatic but completely ambulatory   Physical Exam General: NAD,  Pulmonary: normal breathing pattern  Abdomen: soft, nontender, + bowel sounds, PEG in place Extremities: no edema, no joint deformities Skin: no rashes Neurological: AAO x4  IMPRESSION:   1.  Diarrhea/Dyspepsia  Ongoing challenges with loose stools however with some improvement since eating more solid foods and decreasing tube feedings. They are hopeful this will improve as time goes on.  We will continue to support and follow as needed.   Goals of care Jesse [name] and his wife are realistic in their understanding regarding his cancer and recent progression. He is clear in his expressed wishes to continue to treat the treatable remaining hopeful for stability. His wife is hopeful that he is able to tolerate treatment. Their goal is to closely watch his quality of life ensuring he is doing as well as he  can. Jesse [name] is emotional stating he knows if he does nothing he will "die" and he is not ready to "give up" yet. Support provided.    4/9- Jesse [name] wishes to be a FULL CODE and pursue treatments that can improve his level of health and functioning. He has HCPOA listing his wife as Management consultant, and Living Will documents on file. Patient and wife interested in MOST form to help consolidate wishes onto one form that could be honored by medical providers/EMS in the event of an emergency. They are taking form home for review and to decide if they would like to complete at next visit  I discussed the importance of continued conversation with family and their medical providers regarding overall plan of care and treatment options, ensuring decisions are within the context of the patients values and GOCs.  PLAN: Lomotil as needed up to four times daily.  Goals of care discussions. Patient is clear in expressed wishes to treat the treatable allowing him an opportunity to continue thriving.   Ongoing goals of care support.  Palliative will plan to see patient back in 2-4 weeks in collaboration to other oncology appointments.   Patient expressed understanding and was in agreement with this plan. He also understands that He can call the clinic at any time with any questions, concerns, or complaints.    Visit consisted of counseling and education dealing with the complex and emotionally intense issues of symptom management and palliative care in the setting of serious and potentially life-threatening illness.Greater than 50%  of this time was spent counseling and coordinating care related to the above assessment and plan.  Willette Alma, AGPCNP-BC  Palliative Medicine Team/[facility]  *Please note that this is a verbal dictation therefore any spelling or grammatical errors are due to the "[employer]agon [employer] One" system interpretation.

## 2023-01-08 ENCOUNTER — Inpatient Hospital Stay: Payer: Medicare Other

## 2023-01-08 DIAGNOSIS — C771 Secondary and unspecified malignant neoplasm of intrathoracic lymph nodes: Secondary | ICD-10-CM | POA: Diagnosis not present

## 2023-01-08 DIAGNOSIS — C3412 Malignant neoplasm of upper lobe, left bronchus or lung: Secondary | ICD-10-CM | POA: Diagnosis not present

## 2023-01-08 DIAGNOSIS — C3431 Malignant neoplasm of lower lobe, right bronchus or lung: Secondary | ICD-10-CM

## 2023-01-08 DIAGNOSIS — Z5111 Encounter for antineoplastic chemotherapy: Secondary | ICD-10-CM | POA: Diagnosis not present

## 2023-01-08 DIAGNOSIS — Z87891 Personal history of nicotine dependence: Secondary | ICD-10-CM | POA: Diagnosis not present

## 2023-01-08 DIAGNOSIS — R000 Tachycardia, unspecified: Secondary | ICD-10-CM | POA: Diagnosis not present

## 2023-01-08 DIAGNOSIS — D72829 Elevated white blood cell count, unspecified: Secondary | ICD-10-CM | POA: Diagnosis not present

## 2023-01-08 DIAGNOSIS — Z9221 Personal history of antineoplastic chemotherapy: Secondary | ICD-10-CM | POA: Diagnosis not present

## 2023-01-08 DIAGNOSIS — Z79899 Other long term (current) drug therapy: Secondary | ICD-10-CM | POA: Diagnosis not present

## 2023-01-08 DIAGNOSIS — Z7982 Long term (current) use of aspirin: Secondary | ICD-10-CM | POA: Diagnosis not present

## 2023-01-08 LAB — CMP (CANCER CENTER ONLY)
ALT: 32 U/L (ref 0–44)
AST: 21 U/L (ref 15–41)
Albumin: 3.8 g/dL (ref 3.5–5.0)
Alkaline Phosphatase: 116 U/L (ref 38–126)
Anion gap: 6 (ref 5–15)
BUN: 22 mg/dL (ref 8–23)
CO2: 30 mmol/L (ref 22–32)
Calcium: 9.8 mg/dL (ref 8.9–10.3)
Chloride: 100 mmol/L (ref 98–111)
Creatinine: 0.67 mg/dL (ref 0.61–1.24)
GFR, Estimated: >60 mL/min (ref >60)
Glucose, Bld: 126 mg/dL — ABNORMAL HIGH (ref 70–99)
Potassium: 3.8 mmol/L (ref 3.5–5.1)
Sodium: 136 mmol/L (ref 135–145)
Total Bilirubin: 0.4 mg/dL (ref 0.3–1.2)
Total Protein: 7.4 g/dL (ref 6.5–8.1)

## 2023-01-08 LAB — CBC WITH DIFFERENTIAL (CANCER CENTER ONLY)
Abs Immature Granulocytes: 0.01 10*3/uL (ref 0.00–0.07)
Basophils Absolute: 0 10*3/uL (ref 0.0–0.1)
Basophils Relative: 1 %
Eosinophils Absolute: 0 10*3/uL (ref 0.0–0.5)
Eosinophils Relative: 2 %
HCT: 35.2 % — ABNORMAL LOW (ref 39.0–52.0)
Hemoglobin: 11.4 g/dL — ABNORMAL LOW (ref 13.0–17.0)
Immature Granulocytes: 1 %
Lymphocytes Relative: 22 %
Lymphs Abs: 0.4 10*3/uL — ABNORMAL LOW (ref 0.7–4.0)
MCH: 28.6 pg (ref 26.0–34.0)
MCHC: 32.4 g/dL (ref 30.0–36.0)
MCV: 88.2 fL (ref 80.0–100.0)
Monocytes Absolute: 0.1 10*3/uL (ref 0.1–1.0)
Monocytes Relative: 4 %
Neutro Abs: 1.4 10*3/uL — ABNORMAL LOW (ref 1.7–7.7)
Neutrophils Relative %: 70 %
Platelet Count: 190 10*3/uL (ref 150–400)
RBC: 3.99 MIL/uL — ABNORMAL LOW (ref 4.22–5.81)
RDW: 14.2 % (ref 11.5–15.5)
WBC Count: 1.9 10*3/uL — ABNORMAL LOW (ref 4.0–10.5)
nRBC: 0 % (ref 0.0–0.2)

## 2023-01-10 ENCOUNTER — Telehealth: Payer: Self-pay | Admitting: Internal Medicine

## 2023-01-10 NOTE — Telephone Encounter (Signed)
Inbound call from [employer] requesting an update regarding supplies form that was sent over on 9/17. Call back number is [phone]. Please advise, thank you.

## 2023-01-10 NOTE — Telephone Encounter (Signed)
Spoke with Amy from [employer] & made her aware that form had not been returned since patient is no longer requiring tube feeds at this time. Dr. Leone Payor has already discussed with dietician at cancer center & patient was also previously made aware.

## 2023-01-15 ENCOUNTER — Other Ambulatory Visit: Payer: Medicare Other

## 2023-01-15 ENCOUNTER — Ambulatory Visit: Payer: Medicare Other

## 2023-01-15 ENCOUNTER — Ambulatory Visit: Payer: Medicare Other | Admitting: Internal Medicine

## 2023-01-19 LAB — ACID FAST CULTURE WITH REFLEXED SENSITIVITIES (MYCOBACTERIA): Acid Fast Culture: NEGATIVE

## 2023-01-21 ENCOUNTER — Other Ambulatory Visit: Payer: Self-pay | Admitting: Family Medicine

## 2023-01-21 MED FILL — Fosaprepitant Dimeglumine For IV Infusion 150 MG (Base Eq): INTRAVENOUS | Qty: 5 | Status: AC

## 2023-01-21 MED FILL — Dexamethasone Sodium Phosphate Inj 100 MG/10ML: INTRAMUSCULAR | Qty: 1 | Status: AC

## 2023-01-22 ENCOUNTER — Inpatient Hospital Stay: Payer: Medicare Other

## 2023-01-22 ENCOUNTER — Other Ambulatory Visit: Payer: Medicare Other

## 2023-01-22 ENCOUNTER — Inpatient Hospital Stay: Payer: Medicare Other | Admitting: Internal Medicine

## 2023-01-22 ENCOUNTER — Inpatient Hospital Stay: Payer: Medicare Other | Attending: Internal Medicine

## 2023-01-22 VITALS — BP 117/69 | HR 83 | Resp 18

## 2023-01-22 DIAGNOSIS — C3431 Malignant neoplasm of lower lobe, right bronchus or lung: Secondary | ICD-10-CM

## 2023-01-22 DIAGNOSIS — C3412 Malignant neoplasm of upper lobe, left bronchus or lung: Secondary | ICD-10-CM | POA: Insufficient documentation

## 2023-01-22 DIAGNOSIS — Z79899 Other long term (current) drug therapy: Secondary | ICD-10-CM | POA: Insufficient documentation

## 2023-01-22 DIAGNOSIS — Z23 Encounter for immunization: Secondary | ICD-10-CM | POA: Diagnosis not present

## 2023-01-22 DIAGNOSIS — C3492 Malignant neoplasm of unspecified part of left bronchus or lung: Secondary | ICD-10-CM

## 2023-01-22 DIAGNOSIS — C771 Secondary and unspecified malignant neoplasm of intrathoracic lymph nodes: Secondary | ICD-10-CM | POA: Diagnosis not present

## 2023-01-22 DIAGNOSIS — I10 Essential (primary) hypertension: Secondary | ICD-10-CM | POA: Diagnosis not present

## 2023-01-22 DIAGNOSIS — Z5111 Encounter for antineoplastic chemotherapy: Secondary | ICD-10-CM | POA: Insufficient documentation

## 2023-01-22 DIAGNOSIS — Z9221 Personal history of antineoplastic chemotherapy: Secondary | ICD-10-CM | POA: Diagnosis not present

## 2023-01-22 DIAGNOSIS — Z923 Personal history of irradiation: Secondary | ICD-10-CM | POA: Insufficient documentation

## 2023-01-22 DIAGNOSIS — Z7982 Long term (current) use of aspirin: Secondary | ICD-10-CM | POA: Diagnosis not present

## 2023-01-22 LAB — CMP (CANCER CENTER ONLY)
ALT: 34 U/L (ref 0–44)
AST: 20 U/L (ref 15–41)
Albumin: 3.8 g/dL (ref 3.5–5.0)
Alkaline Phosphatase: 111 U/L (ref 38–126)
Anion gap: 8 (ref 5–15)
BUN: 24 mg/dL — ABNORMAL HIGH (ref 8–23)
CO2: 28 mmol/L (ref 22–32)
Calcium: 9.8 mg/dL (ref 8.9–10.3)
Chloride: 105 mmol/L (ref 98–111)
Creatinine: 0.72 mg/dL (ref 0.61–1.24)
GFR, Estimated: >60 mL/min (ref >60)
Glucose, Bld: 94 mg/dL (ref 70–99)
Potassium: 3.7 mmol/L (ref 3.5–5.1)
Sodium: 141 mmol/L (ref 135–145)
Total Bilirubin: 0.3 mg/dL (ref 0.3–1.2)
Total Protein: 7.2 g/dL (ref 6.5–8.1)

## 2023-01-22 LAB — CBC WITH DIFFERENTIAL (CANCER CENTER ONLY)
Abs Immature Granulocytes: 0 10*3/uL (ref 0.00–0.07)
Basophils Absolute: 0 10*3/uL (ref 0.0–0.1)
Basophils Relative: 1 %
Eosinophils Absolute: 0.1 10*3/uL (ref 0.0–0.5)
Eosinophils Relative: 2 %
HCT: 35.7 % — ABNORMAL LOW (ref 39.0–52.0)
Hemoglobin: 11.6 g/dL — ABNORMAL LOW (ref 13.0–17.0)
Immature Granulocytes: 0 %
Lymphocytes Relative: 20 %
Lymphs Abs: 0.6 10*3/uL — ABNORMAL LOW (ref 0.7–4.0)
MCH: 28.7 pg (ref 26.0–34.0)
MCHC: 32.5 g/dL (ref 30.0–36.0)
MCV: 88.4 fL (ref 80.0–100.0)
Monocytes Absolute: 0.3 10*3/uL (ref 0.1–1.0)
Monocytes Relative: 11 %
Neutro Abs: 2 10*3/uL (ref 1.7–7.7)
Neutrophils Relative %: 66 %
Platelet Count: 196 10*3/uL (ref 150–400)
RBC: 4.04 MIL/uL — ABNORMAL LOW (ref 4.22–5.81)
RDW: 15.7 % — ABNORMAL HIGH (ref 11.5–15.5)
WBC Count: 3 10*3/uL — ABNORMAL LOW (ref 4.0–10.5)
nRBC: 0 % (ref 0.0–0.2)

## 2023-01-22 MED ORDER — SODIUM CHLORIDE 0.9 % IV SOLN
150.0000 mg | Freq: Once | INTRAVENOUS | Status: AC
  Administered 2023-01-22: 150 mg via INTRAVENOUS
  Filled 2023-01-22: qty 150

## 2023-01-22 MED ORDER — SODIUM CHLORIDE 0.9 % IV SOLN: Freq: Once | INTRAVENOUS | Status: AC

## 2023-01-22 MED ORDER — SODIUM CHLORIDE 0.9 % IV SOLN
422.5000 mg | Freq: Once | INTRAVENOUS | Status: AC
  Administered 2023-01-22: 420 mg via INTRAVENOUS
  Filled 2023-01-22: qty 42

## 2023-01-22 MED ORDER — PALONOSETRON HCL INJECTION 0.25 MG/5ML
0.2500 mg | Freq: Once | INTRAVENOUS | Status: AC
  Administered 2023-01-22: 0.25 mg via INTRAVENOUS
  Filled 2023-01-22: qty 5

## 2023-01-22 MED ORDER — SODIUM CHLORIDE 0.9 % IV SOLN
500.0000 mg/m2 | Freq: Once | INTRAVENOUS | Status: AC
  Administered 2023-01-22: 900 mg via INTRAVENOUS
  Filled 2023-01-22: qty 20

## 2023-01-22 MED ORDER — SODIUM CHLORIDE 0.9 % IV SOLN
10.0000 mg | Freq: Once | INTRAVENOUS | Status: AC
  Administered 2023-01-22: 10 mg via INTRAVENOUS
  Filled 2023-01-22: qty 10

## 2023-01-22 NOTE — Patient Instructions (Signed)
[facility]  Discharge Instructions: Thank you for choosing [facility] to provide your oncology and hematology care.   If you have a lab appointment with the Cancer Center, please go directly to the Cancer Center and check in at the registration area.   Wear comfortable clothing and clothing appropriate for easy access to any Portacath or PICC line.   We strive to give you quality time with your provider. You may need to reschedule your appointment if you arrive late (15 or more minutes).  Arriving late affects you and other patients whose appointments are after yours.  Also, if you miss three or more appointments without notifying the office, you may be dismissed from the clinic at the provider's discretion.      For prescription refill requests, have your pharmacy contact our office and allow 72 hours for refills to be completed.    Today you received the following chemotherapy and/or immunotherapy agents Alimta, Carboplatin.      To help prevent nausea and vomiting after your treatment, we encourage you to take your nausea medication as directed.  BELOW ARE SYMPTOMS THAT SHOULD BE REPORTED IMMEDIATELY: *FEVER GREATER THAN 100.4 F (38 C) OR HIGHER *CHILLS OR SWEATING *NAUSEA AND VOMITING THAT IS NOT CONTROLLED WITH YOUR NAUSEA MEDICATION *UNUSUAL SHORTNESS OF BREATH *UNUSUAL BRUISING OR BLEEDING *URINARY PROBLEMS (pain or burning when urinating, or frequent urination) *BOWEL PROBLEMS (unusual diarrhea, constipation, pain near the anus) TENDERNESS IN MOUTH AND THROAT WITH OR WITHOUT PRESENCE OF ULCERS (sore throat, sores in mouth, or a toothache) UNUSUAL RASH, SWELLING OR PAIN  UNUSUAL VAGINAL DISCHARGE OR ITCHING   Items with * indicate a potential emergency and should be followed up as soon as possible or go to the Emergency Department if any problems should occur.  Please show the CHEMOTHERAPY ALERT CARD or IMMUNOTHERAPY ALERT  CARD at check-in to the Emergency Department and triage nurse.  Should you have questions after your visit or need to cancel or reschedule your appointment, please contact [facility]  Dept: [phone]  and follow the prompts.  Office hours are 8:00 a.m. to 4:30 p.m. Monday - Friday. Please note that voicemails left after 4:00 p.m. may not be returned until the following business day.  We are closed weekends and major holidays. You have access to a nurse at all times for urgent questions. Please call the main number to the clinic Dept: [phone] and follow the prompts.   For any non-urgent questions, you may also contact your provider using MyChart. We now offer e-Visits for anyone 18 and older to request care online for non-urgent symptoms. For details visit [email or web address].   Also download the MyChart app! Go to the app store, search "MyChart", open the app, select [facility], and log in with your MyChart username and password.   

## 2023-01-22 NOTE — Progress Notes (Signed)
[facility] [phone]) [phone]   [phone]  OFFICE PROGRESS NOTE  [name], MD [address] [place] [address]  DIAGNOSIS:  1) recurrent non-small cell lung cancer, adenocarcinoma with positive mediastinal lymph node in December 2023 that was initially diagnosed as stage IIB (T1 a, N1, M0) non-small cell lung cancer, adenocarcinoma diagnosed in June 2021.  The patient also has evidence for recurrent disease in the mediastinal lymph node in January 2024 and in the right lower lobe in August 2024. 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  Biomarker Findings Tumor Mutational Burden - 20 Muts/Mb Microsatellite status - MS-Stable Genomic Findings For a complete list of the genes assayed, please refer to the Appendix. KEAP1 I422fs*17 DNMT3A E817* RB1 splice site 1960+5G>C TP53 R209* 8 Disease relevant genes with no reportable alterations: ALK, BRAF, EGFR, ERBB2, KRAS, MET, RET, ROS1  PDL1 Expression: 1 %  PRIOR THERAPY:  1) status post left upper lobectomy with lymph node dissection on December 16, 2019 under the care of Dr. Cliffton Asters.  The tumor size measured 0.9 cm but there was involvement of the level 10 L and 11 L.  2) Adjuvant systemic chemotherapy with cisplatin 75 mg/M2 and Alimta 500 mg/M2 every 3 weeks.  First dose February 04, 2020.  Status post 4 cycles. 3) Neoadjuvant treatment with imatinib 400 mg p.o. daily for the GIST tumor of the pelvic area.  First dose started July 18, 2020.  Status post 12 months of treatment. 4) status post robotic assisted very low anterior rectosigmoid resection with coloanal anastomosis, diverting loop ileostomy and wedge liver biopsy under the care of Dr. Michaell Cowing on December 14, 2021 and it showed minimal residual gastrointestinal stromal tumor. 5) concurrent chemoradiation with weekly carboplatin for AUC of 2 and paclitaxel 45 Mg/M2.  First dose May 14, 2022. 6) Imatinib 400 mg p.o.  daily  CURRENT THERAPY: Systemic chemotherapy with carboplatin for AUC of 5 and Alimta 500 Mg/M2 every 3 weeks.  First dose 12/25/2022.  Status post 1 cycle.  INTERVAL HISTORY: Jesse [name] 72 y.o. male returns to the clinic today for follow-up visit accompanied by his wife.Discussed the use of AI scribe software for clinical note transcription with the patient, who gave verbal consent to proceed.  History of Present Illness   The patient, a 72 year old individual with a history of stage 2B non-small cell lung cancer, initially diagnosed in June 2021, underwent surgical resection and experienced a recurrence in December 2023. He is currently undergoing chemotherapy with carboplatin and Alimta, having completed one cycle of treatment. He also has a history of pelvic GIST diagnosed in 12/2019.  The patient reported experiencing night sweats after the first round of chemotherapy, necessitating multiple changes of clothes. However, these symptoms resolved, and the patient reported feeling fine thereafter.  The patient also reported ongoing issues with bowel movements, which are not characterized as diarrhea. These symptoms have been improving over the past week. The patient has been taken off all medications that have been removed from the list and is now consuming regular food, no longer requiring feeding through a tube. Despite this, the patient's weight has not increased significantly and fluctuates, with a recent weight recorded at 134 pounds.  Following the first infusion, the patient experienced a brief period of coughing, which subsequently resolved. The patient denied experiencing any chest pain or shortness of breath.  The patient was previously on blood pressure medication, which has been discontinued. A recent blood  pressure reading was slightly elevated at 139/80. The patient denied any leg swelling.        MEDICAL HISTORY: Past Medical History:  Diagnosis Date   Anemia    Cancer  (HCC)    bladder cancer   COPD (chronic obstructive pulmonary disease) (HCC)    mild    DIVERTICULOSIS, COLON 01/14/2007   Qualifier: Diagnosis of  By: Marcelyn Ditty RN, Katy Fitch    GASTRITIS, CHRONIC 06/01/2004   Qualifier: Diagnosis of  By: Creta Levin CMA (AAMA), Robin     GERD 01/09/2007   Qualifier: Diagnosis of  By: Tawanna Cooler, RN, Ellen     Heart murmur    as a child; no murmur heard 12/14/19   History of radiation therapy    Mediastinum- 05/15/22-06/28/22-Dr. Antony Blackbird   HYPERLIPIDEMIA 01/09/2007   Qualifier: Diagnosis of  By: Everett Graff     Hypertension    Lung mass    left upper nodule   Macular degeneration    right eye   NEOPLASM, MALIGNANT, BLADDER, HX OF 2002   Qualifier: Diagnosis of  By: Creta Levin CMA (AAMA), Robin  / no chemo or radiation   OSTEOARTHRITIS 01/14/2007   Qualifier: Diagnosis of  By: Marcelyn Ditty, RN, Katy Fitch - knees   Rectal mass    Reflux esophagitis 06/01/2004   Qualifier: Diagnosis of  By: Creta Levin CMA (AAMA), Robin     Stroke [facility])    TOBACCO USER 02/16/2009   Qualifier: Diagnosis of  By: Amador Cunas  MD, Janett Labella    VITAMIN D DEFICIENCY 06/03/2007   Qualifier: Diagnosis of  By: Amador Cunas  MD, Janett Labella     ALLERGIES:  has No Known Allergies.  MEDICATIONS:  Current Outpatient Medications  Medication Sig Dispense Refill   acetaminophen (TYLENOL) 500 MG tablet Take 1,000 mg by mouth every 8 (eight) hours as needed for moderate pain.     aspirin 81 MG chewable tablet Chew 1 tablet (81 mg total) by mouth daily.     atorvastatin (LIPITOR) 40 MG tablet Take 1/2 tablet (20 mg total) by mouth daily. 90 tablet 3   Cholecalciferol (VITAMIN D3 PO) Take 2,000 Units by mouth daily.     diphenoxylate-atropine (LOMOTIL) 2.5-0.025 MG tablet Take 2 tablets by mouth 3 (three) times daily as needed for diarrhea or loose stools. 90 tablet 0   ferrous sulfate 325 (65 FE) MG EC tablet Take 1 tablet (325 mg total) by mouth 2 (two) times daily. 60 tablet 1   folic acid  (FOLVITE) 1 MG tablet Take 1 tablet (1 mg total) by mouth daily. 30 tablet 4   lidocaine (XYLOCAINE) 5 % ointment Apply 1 Application topically 3 (three) times daily as needed for mild pain or moderate pain. 35.44 g 3   loperamide (IMODIUM A-D) 2 MG tablet Take 4 mg by mouth 4 (four) times daily as needed for diarrhea or loose stools.     mirtazapine (REMERON) 30 MG tablet Take 30 mg by mouth at bedtime.     Multiple Vitamins-Minerals (PRESERVISION AREDS 2) CAPS Take 1 capsule by mouth 2 (two) times daily.     OVER THE COUNTER MEDICATION Take 1 tablet by mouth daily. PROBIOTICS     pantoprazole (PROTONIX) 40 MG tablet Take 1 tablet (40 mg total) by mouth 2 (two) times daily. 60 tablet 3   prochlorperazine (COMPAZINE) 10 MG tablet Take 1 tablet (10 mg total) by mouth every 6 (six) hours as needed for nausea or vomiting. 30 tablet 0   No  current facility-administered medications for this visit.    SURGICAL HISTORY:  Past Surgical History:  Procedure Laterality Date   BIOPSY  06/18/2022   Procedure: BIOPSY;  Surgeon: Shellia Cleverly, DO;  Location: [facility];  Service: Gastroenterology;;   BREAST BIOPSY Left 03/16/2022   [place] LT RADIOACTIVE SEED LOC 03/16/2022 [facility]   BRONCHIAL BIOPSY  10/13/2019   Procedure: BRONCHIAL BIOPSIES;  Surgeon: Josephine Igo, DO;  [facility];  Service: Pulmonary;;   BRONCHIAL BIOPSY  12/04/2022   Procedure: BRONCHIAL BIOPSIES;  Surgeon: Josephine Igo, DO;  [facility];  Service: Pulmonary;;   BRONCHIAL BRUSHINGS  10/13/2019   Procedure: BRONCHIAL BRUSHINGS;  Surgeon: Josephine Igo, DO;  [facility];  Service: Pulmonary;;   BRONCHIAL NEEDLE ASPIRATION BIOPSY  10/13/2019   Procedure: BRONCHIAL NEEDLE ASPIRATION BIOPSIES;  Surgeon: Josephine Igo, DO;  [facility];  Service: Pulmonary;;   BRONCHIAL NEEDLE ASPIRATION BIOPSY  04/11/2022   Procedure: BRONCHIAL NEEDLE ASPIRATION BIOPSIES;  Surgeon: Josephine Igo, DO;  [facility];  Service: Cardiopulmonary;;   BRONCHIAL NEEDLE ASPIRATION BIOPSY  12/04/2022   Procedure: BRONCHIAL NEEDLE ASPIRATION BIOPSIES;  Surgeon: Josephine Igo, DO;  [facility];  Service: Pulmonary;;   BRONCHIAL WASHINGS  10/13/2019   Procedure: BRONCHIAL WASHINGS;  Surgeon: Josephine Igo, DO;  [facility];  Service: Pulmonary;;   BRONCHIAL WASHINGS  12/04/2022   Procedure: BRONCHIAL WASHINGS;  Surgeon: Josephine Igo, DO;  [facility];  Service: Pulmonary;;   COLONOSCOPY     multiple   DIVERTING ILEOSTOMY N/A 12/14/2021   Procedure: DIVERTING ILEOSTOMY;  Surgeon: Karie Soda, MD;  [facility]: [facility];  Service: General;  Laterality: N/A;   ENDOBRONCHIAL ULTRASOUND Bilateral 04/11/2022   Procedure: ENDOBRONCHIAL ULTRASOUND;  Surgeon: Josephine Igo, DO;  [facility];  Service: Cardiopulmonary;  Laterality: Bilateral;   ESOPHAGEAL DILATION  06/18/2022   Procedure: ESOPHAGEAL DILATION;  Surgeon: Shellia Cleverly, DO;  [facility];  Service: Gastroenterology;;   ESOPHAGOGASTRODUODENOSCOPY (EGD) WITH PROPOFOL N/A 06/18/2022   Procedure: ESOPHAGOGASTRODUODENOSCOPY (EGD) WITH PROPOFOL;  Surgeon: Shellia Cleverly, DO;  [facility];  Service: Gastroenterology;  Laterality: N/A;  Severe dysphagia/odynophagia ?radiation esophagitis versus oral candidiasis   FIDUCIAL MARKER PLACEMENT  12/04/2022   Procedure: FIDUCIAL MARKER PLACEMENT;  Surgeon: Josephine Igo, DO;  [facility];  Service: Pulmonary;;   ILEOSTOMY CLOSURE N/A 03/22/2022   Procedure: OPEN TAKEDOWN OF LOOP ILEOSTOMY;  Surgeon: Karie Soda, MD;  [facility];  Service: General;  Laterality: N/A;  GEN w/ERAS PATHWAY LOCAL   INTERCOSTAL NERVE BLOCK Left 12/16/2019   Procedure: INTERCOSTAL NERVE BLOCK;  Surgeon: Corliss Skains, MD;  [facility];  Service: Thoracic;  Laterality: Left;   IR FLUORO RM 30-60 MIN  07/02/2022    KNEE SURGERY  07/20/2008   bilat.   NASAL SEPTUM SURGERY  1995   NODE DISSECTION Left 12/16/2019   Procedure: NODE DISSECTION;  Surgeon: Corliss Skains, MD;  [facility];  Service: Thoracic;  Laterality: Left;   PEG PLACEMENT N/A 07/04/2022   Procedure: PERCUTANEOUS ENDOSCOPIC GASTROSTOMY (PEG) PLACEMENT;  Surgeon: Iva Boop, MD;  [facility];  Service: Gastroenterology;  Laterality: N/A;   RADIOACTIVE SEED GUIDED AXILLARY SENTINEL LYMPH NODE Left 03/20/2022   Procedure: RADIOACTIVE SEED GUIDED LEFT AXILLARY SENTINEL LYMPH NODE BIOPSY;  Surgeon: Harriette Bouillon, MD;  [facility];  Service: General;  Laterality: Left;   RECTAL EXAM  UNDER ANESTHESIA N/A 03/22/2022   Procedure: ANORECTAL EXAMINATION UNDER ANESTHESIA;  Surgeon: Karie Soda, MD;  [facility];  Service: General;  Laterality: N/A;   TONSILLECTOMY     TRANSURETHRAL RESECTION OF BLADDER TUMOR  2002   UPPER GASTROINTESTINAL ENDOSCOPY     VIDEO BRONCHOSCOPY  04/11/2022   Procedure: VIDEO BRONCHOSCOPY;  Surgeon: Josephine Igo, DO;  [facility];  Service: Cardiopulmonary;;   VIDEO BRONCHOSCOPY WITH ENDOBRONCHIAL NAVIGATION N/A 10/13/2019   Procedure: VIDEO BRONCHOSCOPY WITH ENDOBRONCHIAL NAVIGATION;  Surgeon: Josephine Igo, DO;  [facility];  Service: Pulmonary;  Laterality: N/A;   WISDOM TOOTH EXTRACTION     XI ROBOTIC ASSISTED LOWER ANTERIOR RESECTION N/A 12/14/2021   Procedure: XI ROBOTIC ASSISTED LOWER ANTERIOR ULTRA LOW RECTOSIGMOID RESECTION, COLOANAL HAND SEWN ANASTOMOSIS, AND BILATERAL TAP BLOCK;  Surgeon: Karie Soda, MD;  Location: [facility];  Service: General;  Laterality: N/A;    REVIEW OF SYSTEMS:  Constitutional: positive for fatigue Eyes: negative Ears, nose, mouth, throat, and face: negative Respiratory: negative Cardiovascular: negative Gastrointestinal: positive for diarrhea Genitourinary:negative Integument/breast: negative Hematologic/lymphatic:  negative Musculoskeletal:negative Neurological: negative Behavioral/Psych: negative Endocrine: negative Allergic/Immunologic: negative   PHYSICAL EXAMINATION: General appearance: alert, cooperative, fatigued, and no distress Head: Normocephalic, without obvious abnormality, atraumatic Neck: no adenopathy, no JVD, supple, symmetrical, trachea midline, and thyroid not enlarged, symmetric, no tenderness/mass/nodules Lymph nodes: Cervical, supraclavicular, and axillary nodes normal. Resp: clear to auscultation bilaterally Back: symmetric, no curvature. ROM normal. No CVA tenderness. Cardio: regular rate and rhythm, S1, S2 normal, no murmur, click, rub or gallop GI: soft, non-tender; bowel sounds normal; no masses,  no organomegaly Extremities: extremities normal, atraumatic, no cyanosis or edema Neurologic: Alert and oriented X 3, normal strength and tone. Normal symmetric reflexes. Normal coordination and gait  ECOG PERFORMANCE STATUS: 1 - Symptomatic but completely ambulatory  Blood pressure 139/80, pulse (!) 105, temperature 97.7 F (36.5 C), resp. rate 16, height 5\' 9"  (1.753 m), weight 137 lb (62.1 kg), SpO2 100%.  LABORATORY DATA: Lab Results  Component Value Date   WBC 1.9 (L) 01/08/2023   HGB 11.4 (L) 01/08/2023   HCT 35.2 (L) 01/08/2023   MCV 88.2 01/08/2023   PLT 190 01/08/2023      Chemistry      Component Value Date/Time   NA 136 01/08/2023 1321   K 3.8 01/08/2023 1321   CL 100 01/08/2023 1321   CO2 30 01/08/2023 1321   BUN 22 01/08/2023 1321   CREATININE 0.67 01/08/2023 1321      Component Value Date/Time   CALCIUM 9.8 01/08/2023 1321   ALKPHOS 116 01/08/2023 1321   AST 21 01/08/2023 1321   ALT 32 01/08/2023 1321   BILITOT 0.4 01/08/2023 1321       RADIOGRAPHIC STUDIES: ECHOCARDIOGRAM COMPLETE  Result Date: 01/04/2023    ECHOCARDIOGRAM REPORT   Patient Name:   ADMIRAL WIESER Date of Exam: 01/04/2023 Medical Rec #:  [id number]       Height:       69.0 in  Accession #:    [id number]      Weight:       140.0 lb Date of Birth:  09/09/1950       BSA:          1.775 m Patient Age:    72 years        BP:           121/67 mmHg Patient Gender: M  HR:           99 bpm. Exam Location:  [address] Procedure: 2D Echo, Cardiac Doppler, Color Doppler and Strain Analysis Indications:    I25.10 CAD  History:        Patient has prior history of Echocardiogram examinations, most                 recent 06/12/2022. CAD, Stroke; Risk Factors:Hypertension and                 Dyslipidemia.  Sonographer:    Samule Ohm RDCS Referring Phys: [id number] Ames Coupe, JR DICK IMPRESSIONS  1. Left ventricular ejection fraction, by estimation, is 45 to 50%. The left ventricle has normal function. The left ventricle demonstrates regional wall motion abnormalities (see scoring diagram/findings for description). There is mild left ventricular  hypertrophy. Left ventricular diastolic parameters are consistent with Grade I diastolic dysfunction (impaired relaxation). There is severe hypokinesis of the left ventricular, mid inferior wall. There is mild dyskinesis of the left ventricular, basal inferior wall. The average left ventricular global longitudinal strain is 8.8 %. The global longitudinal strain is abnormal.  2. Right ventricular systolic function is normal. The right ventricular size is normal. There is normal pulmonary artery systolic pressure. The estimated right ventricular systolic pressure is 23.6 mmHg.  3. The mitral valve is normal in structure. No evidence of mitral valve regurgitation.  4. The aortic valve is tricuspid. There is mild calcification of the aortic valve. Aortic valve regurgitation is not visualized. Aortic valve sclerosis/calcification is present, without any evidence of aortic stenosis.  5. The inferior vena cava is normal in size with greater than 50% respiratory variability, suggesting right atrial pressure of 3 mmHg. Comparison(s): Prior images reviewed  side by side. The left ventricular function is unchanged. Wall motion is poorly seen on the previous study. The described inferior wall motion abnormality is probably not be a new finding. FINDINGS  Left Ventricle: Left ventricular ejection fraction, by estimation, is 45 to 50%. The left ventricle has normal function. The left ventricle demonstrates regional wall motion abnormalities. Severe hypokinesis of the left ventricular, mid inferior wall. Mild dyskinesis of the left ventricular, basal inferior wall. The average left ventricular global longitudinal strain is 8.8 %. The global longitudinal strain is abnormal. The left ventricular internal cavity size was normal in size. There is mild left ventricular hypertrophy. Left ventricular diastolic parameters are consistent with Grade I diastolic dysfunction (impaired relaxation). Normal left ventricular filling pressure. Right Ventricle: The right ventricular size is normal. No increase in right ventricular wall thickness. Right ventricular systolic function is normal. There is normal pulmonary artery systolic pressure. The tricuspid regurgitant velocity is 2.27 m/s, and  with an assumed right atrial pressure of 3 mmHg, the estimated right ventricular systolic pressure is 23.6 mmHg. Left Atrium: Left atrial size was normal in size. Right Atrium: Right atrial size was normal in size. Pericardium: There is no evidence of pericardial effusion. Mitral Valve: The mitral valve is normal in structure. No evidence of mitral valve regurgitation. Tricuspid Valve: The tricuspid valve is normal in structure. Tricuspid valve regurgitation is trivial. Aortic Valve: The aortic valve is tricuspid. There is mild calcification of the aortic valve. Aortic valve regurgitation is not visualized. Aortic valve sclerosis/calcification is present, without any evidence of aortic stenosis. Pulmonic Valve: The pulmonic valve was normal in structure. Pulmonic valve regurgitation is not  visualized. No evidence of pulmonic stenosis. Aorta: The aortic root and ascending aorta are structurally  normal, with no evidence of dilitation. Venous: The inferior vena cava is normal in size with greater than 50% respiratory variability, suggesting right atrial pressure of 3 mmHg. IAS/Shunts: No atrial level shunt detected by color flow Doppler.  LEFT VENTRICLE PLAX 2D LVIDd:         4.40 cm   Diastology LVIDs:         3.10 cm   LV e' medial:    6.20 cm/s LV PW:         1.20 cm   LV E/e' medial:  8.5 LV IVS:        1.30 cm   LV e' lateral:   6.53 cm/s LVOT diam:     2.50 cm   LV E/e' lateral: 8.1 LV SV:         65 LV SV Index:   37        2D Longitudinal Strain LVOT Area:     4.91 cm  2D Strain GLS (A2C):   7.8 %                          2D Strain GLS (A3C):   10.9 %                          2D Strain GLS (A4C):   7.6 %                          2D Strain GLS Avg:     8.8 % RIGHT VENTRICLE             IVC RV S prime:     11.60 cm/s  IVC diam: 1.00 cm TAPSE (M-mode): 1.8 cm RVSP:           23.6 mmHg LEFT ATRIUM             Index        RIGHT ATRIUM           Index LA diam:        3.40 cm 1.92 cm/m   RA Pressure: 3.00 mmHg LA Vol (A2C):   42.1 ml 23.71 ml/m  RA Area:     11.30 cm LA Vol (A4C):   38.0 ml 21.40 ml/m  RA Volume:   23.20 ml  13.07 ml/m LA Biplane Vol: 40.1 ml 22.59 ml/m  AORTIC VALVE LVOT Vmax:   80.60 cm/s LVOT Vmean:  49.900 cm/s LVOT VTI:    0.133 m  AORTA Ao Root diam: 3.60 cm Ao Asc diam:  3.50 cm MITRAL VALVE               TRICUSPID VALVE MV Area (PHT): 2.28 cm    TR Peak grad:   20.6 mmHg MV Decel Time: 333 msec    TR Vmax:        227.00 cm/s MV E velocity: 52.90 cm/s  Estimated RAP:  3.00 mmHg MV A velocity: 85.90 cm/s  RVSP:           23.6 mmHg MV E/A ratio:  0.62                            SHUNTS                            Systemic VTI:  0.13  m                            Systemic Diam: 2.50 cm Thurmon Fair MD Electronically signed by Thurmon Fair MD Signature Date/Time:  01/04/2023/1:06:15 PM    Final    DG Chest 2 View  Result Date: 01/03/2023 CLINICAL DATA:  Hemoptysis EXAM: CHEST - 2 VIEW COMPARISON:  Chest radiograph 12/04/2022, chest CT 12/03/2022 FINDINGS: The heart size is stable and within normal limits. The upper mediastinal contours are normal. Left suprahilar opacity and associated architectural distortion is unchanged. Patchy opacities in the right lower lobe are also unchanged. Opacities in the right midlung appear worsened compared to the prior radiograph. There is no pulmonary edema. There is no pleural effusion or pneumothorax There is no acute osseous abnormality. IMPRESSION: 1. Worsened opacities in the right midlung compared to the study from 12/04/2022, concerning for infection. 2. Additional right basilar and left suprahilar opacities are unchanged compared to the prior radiograph and CT. Electronically Signed   By: Lesia Hausen M.D.   On: 01/03/2023 14:33    ASSESSMENT AND PLAN: This is a very pleasant 72 years old white male recently diagnosed with a stage IIb (T1 a, N1, M0) non-small cell lung cancer, adenocarcinoma in June 2021 status post left upper lobectomy with lymph node dissection on December 16, 2019 under the care of Dr. Cliffton Asters.  The patient has no actionable mutations and PD-L1 expression was 1%. He was also diagnosed with pelvic GIST tumor. The patient completed a course of adjuvant systemic chemotherapy with cisplatin 75 mg/M2 and Alimta 500 mg/M2 every 3 weeks status post 4 cycles.  He tolerated his treatment well except for fatigue and occasional nausea. For the pelvic GIST tumor, the patient completed neoadjuvant treatment with imatinib 400 mg p.o. daily in April 2023.  Status post 24 months.   The patient underwent surgical resection of the remaining gastrointestinal stromal tumor under the care of Dr. Michaell Cowing on December 14, 2021 with very minimal residual disease.  He resumed his treatment with imatinib and tolerating it fairly  well. Repeat CT scan of the chest for restaging of his lung cancer. There was increased density of soft tissue in the left hilar region concerning for possible developing lymphadenopathy or disease recurrence.  A PET scan was performed recently and that showed new hypermetabolic left axillary, mediastinal and left hilar adenopathy compatible with recurrent metastatic lung cancer no hypermetabolic metastatic disease in the neck, abdomen, pelvis or skeleton. He underwent excisional biopsy of the left axillary lymph node that was not conclusive for malignancy. The patient was seen by Dr. Tonia Brooms and repeat video bronchoscopy with EBUS on April 11, 2022 and biopsy from the station 7 lymph node showed recurrent adenocarcinoma. I had a lengthy discussion with the patient today about his current condition and treatment options. I recommended for the patient a course of concurrent chemoradiation with weekly carboplatin for AUC of 2 and paclitaxel 45 Mg/M2 for 6-7 weeks.  Status post 3 cycles of chemotherapy but he completed the course of radiation.  The patient was admitted to the hospital on June 07, 2022 with significant sepsis and Klebsiella pneumonia bacteremia.  He continues to have persistent diarrhea despite discontinuation of imatinib and treatment with cholestyramine.  This is likely functional diarrhea after his surgical resection for the pelvic GIST tumor. Repeat PET scan showed no concerning findings for disease progression except for suspicious slowly growing adenocarcinoma in the right lower  lobe but inflammatory changes in the right lung as well as suspicious reactive mediastinal lymphadenopathy.  The patient had repeat bronchoscopy by Dr. Tonia Brooms and the final pathology was consistent with recurrent non-small cell lung cancer. The patient started systemic chemotherapy with carboplatin for AUC of 5 and Alimta 500 Mg/M2 on 12/25/2022.  Status post 1 cycle.  The patient has been tolerating his  treatment fairly well.    Non-Small Cell Lung Cancer (Stage 2B) Recurrence in December 2023, currently on second cycle of chemotherapy with Carboplatin and Alimta. Tolerated first cycle well with transient night sweats and cough. -Continue with cycle 2 of chemotherapy today. -Call symptom management clinic if any urgent issues arise.   - For the pelvic GIST tumor, the patient completed neoadjuvant treatment with imatinib 400 mg p.o. daily in April 2023.  Status post 24 months.   Gastrointestinal Issues Improvement in bowel movements over the past week after discontinuation of certain medications and transition to real food from tube feeding. -Continue current dietary regimen. -Keep feeding tube in place as a precaution during chemotherapy.  Weight Fluctuations Weight has been fluctuating, but overall stable. Current weight 134 lbs. -Continue efforts to increase weight, including consumption of real food and nutritional supplements.  Hypertension Blood pressure slightly elevated today (139/80), but patient is off blood pressure medication. -Monitor blood pressure, consider potential stress-related elevation due to treatment.   He will come back for follow-up visit in 3 weeks for evaluation before the next cycle of his treatment. For the diarrhea, he will continue his routine follow-up visit and evaluation by Dr. Leone Payor. The patient was advised to call immediately if he has any other concerning symptoms in the interval. The patient voices understanding of current disease status and treatment options and is in agreement with the current care plan.  All questions were answered. The patient knows to call the clinic with any problems, questions or concerns. We can certainly see the patient much sooner if necessary. The total time spent in the appointment was 30 minutes.  Disclaimer: This note was dictated with voice recognition software. Similar sounding words can inadvertently be  transcribed and may not be corrected upon review.

## 2023-01-24 NOTE — Telephone Encounter (Signed)
Called patient he is aware.

## 2023-01-28 DIAGNOSIS — M9903 Segmental and somatic dysfunction of lumbar region: Secondary | ICD-10-CM | POA: Diagnosis not present

## 2023-01-28 DIAGNOSIS — M5431 Sciatica, right side: Secondary | ICD-10-CM | POA: Diagnosis not present

## 2023-01-28 NOTE — Progress Notes (Deleted)
Palliative Medicine [facility]  Telephone:([phone]) [phone] Fax:(336) [phone]   Name: Jesse [name] Date: 01/28/2023 MRN: [id number]  DOB: Jul 18, 1950  Patient Care Team: [name], MD as PCP - General (Family Medicine) Si Gaul, MD as Consulting Physician (Oncology) Verner Chol, [facility] (Inactive) as Pharmacist (Pharmacist) Iva Boop, MD as Consulting Physician (Gastroenterology) Josephine Igo, DO as Consulting Physician (Pulmonary Disease) Berneice Heinrich Delbert Phenix., MD as Consulting Physician (Urology) Karie Soda, MD as Consulting Physician (General Surgery) Pricilla Riffle, MD as Consulting Physician (Cardiology)    INTERVAL HISTORY: Jesse [name] is a 72 y.o. 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 Previous medical history also includes hypertension, hyperlipidemia, GERD, anemia, COPD, and macular degeneration and a stroke during most recent hospitalization. Surgical history includes ileostomy and takedown. Peg tube placed (07/04/2022) for esophageal stricture/dysphagia. Palliative asked to see for symptom management and goals of care.   SOCIAL HISTORY:     reports that he quit smoking about 3 years ago. His smoking use included cigarettes. He started smoking about 43 years ago. He has a 20 pack-year smoking history. He has never used smokeless tobacco. He reports that he does not currently use alcohol after a past usage of about 15.0 standard drinks of alcohol per week. He reports that he does not currently use drugs after having used the following drugs: Marijuana.  ADVANCE DIRECTIVES:  Advanced directives on file  CODE STATUS: Full code  PAST MEDICAL HISTORY: Past Medical History:  Diagnosis Date   Anemia    Cancer (HCC)    bladder cancer   COPD (chronic obstructive pulmonary disease)  (HCC)    mild    DIVERTICULOSIS, COLON 01/14/2007   Qualifier: Diagnosis of  By: Marcelyn Ditty RN, Katy Fitch    GASTRITIS, CHRONIC 06/01/2004   Qualifier: Diagnosis of  By: Creta Levin CMA (AAMA), Robin     GERD 01/09/2007   Qualifier: Diagnosis of  By: Tawanna Cooler, RN, Ellen     Heart murmur    as a child; no murmur heard 12/14/19   History of radiation therapy    Mediastinum- 05/15/22-06/28/22-Dr. Antony Blackbird   HYPERLIPIDEMIA 01/09/2007   Qualifier: Diagnosis of  By: Everett Graff     Hypertension    Lung mass    left upper nodule   Macular degeneration    right eye   NEOPLASM, MALIGNANT, BLADDER, HX OF 2002   Qualifier: Diagnosis of  By: Creta Levin CMA (AAMA), Robin  / no chemo or radiation   OSTEOARTHRITIS 01/14/2007   Qualifier: Diagnosis of  By: Marcelyn Ditty, RN, Katy Fitch - knees   Rectal mass    Reflux esophagitis 06/01/2004   Qualifier: Diagnosis of  By: Creta Levin CMA (AAMA), Robin     Stroke [facility])    TOBACCO USER 02/16/2009   Qualifier: Diagnosis of  By: Amador Cunas  MD, Janett Labella    VITAMIN D DEFICIENCY 06/03/2007   Qualifier: Diagnosis of  By: Amador Cunas  MD, Janett Labella     ALLERGIES:  has No Known Allergies.  MEDICATIONS:  Current Outpatient Medications  Medication Sig Dispense Refill   acetaminophen (TYLENOL) 500 MG tablet Take 1,000 mg by mouth every 8 (eight) hours as needed for moderate pain.     aspirin 81 MG chewable tablet Chew 1 tablet (81 mg total) by mouth daily.     atorvastatin (LIPITOR) 20 MG  tablet Take 1 tablet (20 mg total) by mouth daily. 90 tablet 3   Cholecalciferol (VITAMIN D3 PO) Take 2,000 Units by mouth daily.     diphenoxylate-atropine (LOMOTIL) 2.5-0.025 MG tablet Take 2 tablets by mouth 3 (three) times daily as needed for diarrhea or loose stools. 90 tablet 0   ferrous sulfate 325 (65 FE) MG EC tablet Take 1 tablet (325 mg total) by mouth 2 (two) times daily. 60 tablet 1   folic acid (FOLVITE) 1 MG tablet Take 1 tablet (1 mg total) by mouth daily. 30 tablet 4    lidocaine (XYLOCAINE) 5 % ointment Apply 1 Application topically 3 (three) times daily as needed for mild pain or moderate pain. 35.44 g 3   loperamide (IMODIUM A-D) 2 MG tablet Take 4 mg by mouth 4 (four) times daily as needed for diarrhea or loose stools.     mirtazapine (REMERON) 30 MG tablet Take 30 mg by mouth at bedtime.     Multiple Vitamins-Minerals (PRESERVISION AREDS 2) CAPS Take 1 capsule by mouth 2 (two) times daily.     OVER THE COUNTER MEDICATION Take 1 tablet by mouth daily. PROBIOTICS     pantoprazole (PROTONIX) 40 MG tablet Take 1 tablet (40 mg total) by mouth 2 (two) times daily. 60 tablet 3   prochlorperazine (COMPAZINE) 10 MG tablet Take 1 tablet (10 mg total) by mouth every 6 (six) hours as needed for nausea or vomiting. 30 tablet 0   No current facility-administered medications for this visit.    VITAL SIGNS: There were no vitals taken for this visit. There were no vitals filed for this visit.  Estimated body mass index is 20.23 kg/m as calculated from the following:   Height as of 01/22/23: 5\' 9"  (1.753 m).   Weight as of 01/22/23: 137 lb (62.1 kg).   PERFORMANCE STATUS (ECOG) : 1 - Symptomatic but completely ambulatory   Physical Exam General: NAD,  Pulmonary: normal breathing pattern  Abdomen: soft, nontender, + bowel sounds, PEG in place Extremities: no edema, no joint deformities Skin: no rashes Neurological: AAO x4  IMPRESSION:   1.  Diarrhea/Dyspepsia  Ongoing challenges with loose stools however with some improvement since eating more solid foods and decreasing tube feedings. They are hopeful this will improve as time goes on.  We will continue to support and follow as needed.   Goals of care  8/27- Jesse [name] and his wife are realistic in their understanding regarding his cancer and recent progression. He is clear in his expressed wishes to continue to treat the treatable remaining hopeful for stability. His wife is hopeful that he is able to tolerate treatment. Their goal is to closely watch his quality of life ensuring he is doing as well as he can. Jesse [name] is emotional stating he knows if he does nothing he will "die" and he is not ready to "give up" yet. Support provided.    4/9- Jesse [name] wishes  to be a FULL CODE and pursue treatments that can improve his level of health and functioning. He has HCPOA listing his wife as Management consultant, and Living Will documents on file. Patient and wife interested in MOST form to help consolidate wishes onto one form that could be honored by medical providers/EMS in the event of an emergency. They are taking form home for review and to decide if they would like to complete at next visit  I discussed the importance of continued conversation with family and their medical providers regarding overall plan of care and treatment options, ensuring decisions are within the context of the patients values and GOCs.  PLAN: Lomotil as needed up to four times daily.  Goals of care discussions. Patient is clear in expressed wishes to treat the treatable allowing him an opportunity to continue thriving.   Ongoing goals of care support.  Palliative will plan to see patient back in 2-4 weeks in collaboration to other oncology appointments.   Patient expressed understanding and was in agreement with this plan. He also understands that He can call the clinic at any time with any questions, concerns, or complaints.    Visit consisted of counseling and education dealing with the complex and emotionally intense issues of symptom management and palliative care in the setting of serious and potentially life-threatening illness.Greater than 50%  of this time was spent counseling and coordinating care related to the above assessment and plan.  Willette Alma, AGPCNP-BC  Palliative Medicine Team/[facility]  *Please note that this is a verbal dictation therefore any spelling or grammatical errors are due to the "Dragon Medical One" system interpretation.

## 2023-01-29 ENCOUNTER — Inpatient Hospital Stay: Payer: Medicare Other

## 2023-01-29 ENCOUNTER — Inpatient Hospital Stay: Payer: Medicare Other | Admitting: Nurse Practitioner

## 2023-02-01 ENCOUNTER — Other Ambulatory Visit: Payer: Self-pay | Admitting: Internal Medicine

## 2023-02-01 NOTE — Telephone Encounter (Signed)
In Jesse [name]'s September note I see patient was increased to 30mg  at bedtime. Please advise Sir, thank you.

## 2023-02-04 DIAGNOSIS — M5431 Sciatica, right side: Secondary | ICD-10-CM | POA: Diagnosis not present

## 2023-02-04 DIAGNOSIS — M9903 Segmental and somatic dysfunction of lumbar region: Secondary | ICD-10-CM | POA: Diagnosis not present

## 2023-02-05 ENCOUNTER — Ambulatory Visit: Payer: Medicare Other | Admitting: Internal Medicine

## 2023-02-05 ENCOUNTER — Ambulatory Visit: Payer: Medicare Other

## 2023-02-05 ENCOUNTER — Other Ambulatory Visit: Payer: Medicare Other

## 2023-02-11 DIAGNOSIS — M9903 Segmental and somatic dysfunction of lumbar region: Secondary | ICD-10-CM | POA: Diagnosis not present

## 2023-02-11 DIAGNOSIS — M5431 Sciatica, right side: Secondary | ICD-10-CM | POA: Diagnosis not present

## 2023-02-11 MED FILL — Fosaprepitant Dimeglumine For IV Infusion 150 MG (Base Eq): INTRAVENOUS | Qty: 5 | Status: AC

## 2023-02-11 NOTE — Progress Notes (Unsigned)
Palliative Medicine [facility]  [phone]) [phone] Fax:(336) [phone]   Name: Jesse [name] Date: 02/11/2023 MRN: [id number]  DOB: [birth year]  Patient Care Team: [name], MD as PCP - General (Family Medicine) Si Gaul, MD as Consulting Physician (Oncology) Verner Chol, [facility] (Inactive) as Pharmacist (Pharmacist) Iva Boop, MD as Consulting Physician (Gastroenterology) Josephine Igo, DO as Consulting Physician (Pulmonary Disease) Berneice Heinrich Delbert Phenix., MD as Consulting Physician (Urology) Karie Soda, MD as Consulting Physician (General Surgery) Pricilla Riffle, MD as Consulting Physician (Cardiology)    INTERVAL HISTORY: Jesse [name] is a 71 y.o. 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 Previous medical history also includes hypertension, hyperlipidemia, GERD, anemia, COPD, and macular degeneration and a stroke during most recent hospitalization. Surgical history includes ileostomy and takedown. Peg tube placed (07/04/2022) for esophageal stricture/dysphagia. Palliative asked to see for symptom management and goals of care.   SOCIAL HISTORY:     reports that he quit smoking about 3 years ago. His smoking use included cigarettes. He started smoking about 43 years ago. He has a 20 pack-year smoking history. He has never used smokeless tobacco. He reports that he does not currently use alcohol after a past usage of about 15.0 standard drinks of alcohol per week. He reports that he does not currently use drugs after having used the following drugs: Marijuana.  ADVANCE DIRECTIVES:  Advanced directives on file  CODE STATUS: Full code  PAST MEDICAL HISTORY: Past Medical History:  Diagnosis Date  . Anemia   . Cancer [facility])    bladder cancer  . COPD (chronic obstructive pulmonary disease)  (HCC)    mild   . DIVERTICULOSIS, COLON 01/14/2007   Qualifier: Diagnosis of  By: Marcelyn Ditty RN, Katy Fitch   . GASTRITIS, CHRONIC 06/01/2004   Qualifier: Diagnosis of  By: Creta Levin CMA (AAMA), Robin    . GERD 01/09/2007   Qualifier: Diagnosis of  By: Everett Graff    . Heart murmur    as a child; no murmur heard 12/14/19  . History of radiation therapy    Mediastinum- 05/15/22-06/28/22-Dr. Antony Blackbird  . HYPERLIPIDEMIA 01/09/2007   Qualifier: Diagnosis of  By: Everett Graff    . Hypertension   . Lung mass    left upper nodule  . Macular degeneration    right eye  . NEOPLASM, MALIGNANT, BLADDER, HX OF 2002   Qualifier: Diagnosis of  By: Creta Levin CMA (AAMA), Robin  / no chemo or radiation  . OSTEOARTHRITIS 01/14/2007   Qualifier: Diagnosis of  By: Marcelyn Ditty RN, Katy Fitch - knees  . Rectal mass   . Reflux esophagitis 06/01/2004   Qualifier: Diagnosis of  By: Creta Levin CMA (AAMA), Robin    . Stroke (HCC)   . TOBACCO USER 02/16/2009   Qualifier: Diagnosis of  By: Amador Cunas  MD, Janett Labella   . VITAMIN D DEFICIENCY 06/03/2007   Qualifier: Diagnosis of  By: Amador Cunas  MD, Janett Labella     ALLERGIES:  has No Known Allergies.  MEDICATIONS:  Current Outpatient Medications  Medication Sig Dispense Refill  . acetaminophen (TYLENOL) 500 MG tablet Take 1,000 mg by mouth every 8 (eight) hours as needed for moderate pain.    Marland Kitchen aspirin 81 MG chewable tablet Chew 1 tablet (81 mg total) by mouth daily.    Marland Kitchen atorvastatin (LIPITOR) 20 MG  tablet Take 1 tablet (20 mg total) by mouth daily. 90 tablet 3  . Cholecalciferol (VITAMIN D3 PO) Take 2,000 Units by mouth daily.    . diphenoxylate-atropine (LOMOTIL) 2.5-0.025 MG tablet Take 2 tablets by mouth 3 (three) times daily as needed for diarrhea or loose stools. 90 tablet 0  . ferrous sulfate 325 (65 FE) MG EC tablet Take 1 tablet (325 mg total) by mouth 2 (two) times daily. 60 tablet 1  . folic acid (FOLVITE) 1 MG tablet Take 1 tablet (1 mg total) by  mouth daily. 30 tablet 4  . lidocaine (XYLOCAINE) 5 % ointment Apply 1 Application topically 3 (three) times daily as needed for mild pain or moderate pain. 35.44 g 3  . loperamide (IMODIUM A-D) 2 MG tablet Take 4 mg by mouth 4 (four) times daily as needed for diarrhea or loose stools.    . mirtazapine (REMERON SOL-TAB) 30 MG disintegrating tablet TAKE 1 TABLET BY MOUTH AT BEDTIME. 90 tablet 1  . Multiple Vitamins-Minerals (PRESERVISION AREDS 2) CAPS Take 1 capsule by mouth 2 (two) times daily.    Marland Kitchen OVER THE COUNTER MEDICATION Take 1 tablet by mouth daily. PROBIOTICS    . pantoprazole (PROTONIX) 40 MG tablet Take 1 tablet (40 mg total) by mouth 2 (two) times daily. 60 tablet 3  . prochlorperazine (COMPAZINE) 10 MG tablet Take 1 tablet (10 mg total) by mouth every 6 (six) hours as needed for nausea or vomiting. 30 tablet 0   No current facility-administered medications for this visit.    VITAL SIGNS: There were no vitals taken for this visit. There were no vitals filed for this visit.  Estimated body mass index is 20.23 kg/m as calculated from the following:   Height as of 01/22/23: 5\' 9"  (1.753 m).   Weight as of 01/22/23: 137 lb (62.1 kg).   PERFORMANCE STATUS (ECOG) : 1 - Symptomatic but completely ambulatory   Physical Exam General: NAD,  Pulmonary: normal breathing pattern  Abdomen: soft, nontender, + bowel sounds, PEG in place Extremities: no edema, no joint deformities Skin: no rashes Neurological: AAO x4  IMPRESSION:   1.  Diarrhea/Dyspepsia  Ongoing challenges with loose stools however with some improvement since eating more solid foods and decreasing tube feedings. They are hopeful this will improve as time goes on.  We will continue to support and follow as needed.   Goals of care  8/27- Mr. Burkland and his wife are realistic in their understanding regarding his cancer and recent progression. He is clear in his expressed wishes to continue to treat the treatable remaining hopeful for stability. His wife is hopeful that he is able to tolerate treatment. Their goal is to closely watch his quality of life ensuring he is doing as well as he can. [name] is emotional stating he knows if he does nothing he will "die" and he is not  ready to "give up" yet. Support provided.    4/9- Mr. Ekker wishes to be a FULL CODE and pursue treatments that can improve his level of health and functioning. He has HCPOA listing his wife as Management consultant, and Living Will documents on file. Patient and wife interested in MOST form to help consolidate wishes onto one form that could be honored by medical providers/EMS in the event of an emergency. They are taking form home for review and to decide if they would like to complete at next visit  I discussed the importance of continued conversation with family and their medical providers regarding overall plan of care and treatment options, ensuring decisions are within the context of the patients values and GOCs.  PLAN: Lomotil as needed up to four times daily.  Goals of care discussions. Patient is clear in expressed wishes to treat the treatable allowing him an opportunity to continue thriving.   Ongoing goals of care support.  Palliative will plan to see patient back in 2-4 weeks in collaboration to other oncology appointments.   Patient expressed understanding and was in agreement with this plan. He also understands that He can call the clinic at any time with any questions, concerns, or complaints.    Visit consisted of counseling and education dealing with the complex and emotionally intense issues of symptom management and palliative care in the setting of serious and potentially life-threatening illness.Greater than 50%  of this time was spent counseling and coordinating care related to the above assessment and plan.  Willette Alma, AGPCNP-BC  Palliative Medicine Team/[facility]  *Please note that this is a verbal dictation therefore any spelling or grammatical errors are due to the "Dragon Medical One" system interpretation.

## 2023-02-12 ENCOUNTER — Other Ambulatory Visit: Payer: Medicare Other

## 2023-02-12 ENCOUNTER — Inpatient Hospital Stay: Payer: Medicare Other | Admitting: Internal Medicine

## 2023-02-12 ENCOUNTER — Encounter: Payer: Self-pay | Admitting: Nurse Practitioner

## 2023-02-12 ENCOUNTER — Inpatient Hospital Stay: Payer: Medicare Other

## 2023-02-12 ENCOUNTER — Inpatient Hospital Stay: Payer: Medicare Other | Admitting: Nutrition

## 2023-02-12 ENCOUNTER — Inpatient Hospital Stay (HOSPITAL_BASED_OUTPATIENT_CLINIC_OR_DEPARTMENT_OTHER): Payer: Medicare Other | Admitting: Nurse Practitioner

## 2023-02-12 VITALS — BP 142/81 | HR 87 | Temp 97.4°F | Resp 17 | Wt 136.6 lb

## 2023-02-12 DIAGNOSIS — C3431 Malignant neoplasm of lower lobe, right bronchus or lung: Secondary | ICD-10-CM

## 2023-02-12 DIAGNOSIS — Z923 Personal history of irradiation: Secondary | ICD-10-CM | POA: Diagnosis not present

## 2023-02-12 DIAGNOSIS — C771 Secondary and unspecified malignant neoplasm of intrathoracic lymph nodes: Secondary | ICD-10-CM | POA: Diagnosis not present

## 2023-02-12 DIAGNOSIS — Z23 Encounter for immunization: Secondary | ICD-10-CM | POA: Diagnosis not present

## 2023-02-12 DIAGNOSIS — C49A5 Gastrointestinal stromal tumor of rectum: Secondary | ICD-10-CM

## 2023-02-12 DIAGNOSIS — C3492 Malignant neoplasm of unspecified part of left bronchus or lung: Secondary | ICD-10-CM

## 2023-02-12 DIAGNOSIS — Z515 Encounter for palliative care: Secondary | ICD-10-CM

## 2023-02-12 DIAGNOSIS — C3412 Malignant neoplasm of upper lobe, left bronchus or lung: Secondary | ICD-10-CM | POA: Diagnosis not present

## 2023-02-12 DIAGNOSIS — C3490 Malignant neoplasm of unspecified part of unspecified bronchus or lung: Secondary | ICD-10-CM

## 2023-02-12 DIAGNOSIS — Z7982 Long term (current) use of aspirin: Secondary | ICD-10-CM | POA: Diagnosis not present

## 2023-02-12 DIAGNOSIS — Z9221 Personal history of antineoplastic chemotherapy: Secondary | ICD-10-CM | POA: Diagnosis not present

## 2023-02-12 DIAGNOSIS — R530 Neoplastic (malignant) related fatigue: Secondary | ICD-10-CM

## 2023-02-12 DIAGNOSIS — Z5111 Encounter for antineoplastic chemotherapy: Secondary | ICD-10-CM | POA: Diagnosis not present

## 2023-02-12 DIAGNOSIS — Z79899 Other long term (current) drug therapy: Secondary | ICD-10-CM | POA: Diagnosis not present

## 2023-02-12 DIAGNOSIS — I10 Essential (primary) hypertension: Secondary | ICD-10-CM | POA: Diagnosis not present

## 2023-02-12 LAB — CBC WITH DIFFERENTIAL (CANCER CENTER ONLY)
Abs Immature Granulocytes: 0.03 10*3/uL (ref 0.00–0.07)
Basophils Absolute: 0 10*3/uL (ref 0.0–0.1)
Basophils Relative: 1 %
Eosinophils Absolute: 0 10*3/uL (ref 0.0–0.5)
Eosinophils Relative: 1 %
HCT: 36 % — ABNORMAL LOW (ref 39.0–52.0)
Hemoglobin: 11.5 g/dL — ABNORMAL LOW (ref 13.0–17.0)
Immature Granulocytes: 1 %
Lymphocytes Relative: 20 %
Lymphs Abs: 0.8 10*3/uL (ref 0.7–4.0)
MCH: 29 pg (ref 26.0–34.0)
MCHC: 31.9 g/dL (ref 30.0–36.0)
MCV: 90.7 fL (ref 80.0–100.0)
Monocytes Absolute: 0.3 10*3/uL (ref 0.1–1.0)
Monocytes Relative: 8 %
Neutro Abs: 2.9 10*3/uL (ref 1.7–7.7)
Neutrophils Relative %: 69 %
Platelet Count: 279 10*3/uL (ref 150–400)
RBC: 3.97 MIL/uL — ABNORMAL LOW (ref 4.22–5.81)
RDW: 17.4 % — ABNORMAL HIGH (ref 11.5–15.5)
WBC Count: 4.1 10*3/uL (ref 4.0–10.5)
nRBC: 0 % (ref 0.0–0.2)

## 2023-02-12 LAB — CMP (CANCER CENTER ONLY)
ALT: 23 U/L (ref 0–44)
AST: 23 U/L (ref 15–41)
Albumin: 4 g/dL (ref 3.5–5.0)
Alkaline Phosphatase: 112 U/L (ref 38–126)
Anion gap: 7 (ref 5–15)
BUN: 22 mg/dL (ref 8–23)
CO2: 30 mmol/L (ref 22–32)
Calcium: 9.9 mg/dL (ref 8.9–10.3)
Chloride: 105 mmol/L (ref 98–111)
Creatinine: 0.73 mg/dL (ref 0.61–1.24)
GFR, Estimated: >60 mL/min (ref >60)
Glucose, Bld: 82 mg/dL (ref 70–99)
Potassium: 3.9 mmol/L (ref 3.5–5.1)
Sodium: 142 mmol/L (ref 135–145)
Total Bilirubin: 0.3 mg/dL (ref 0.3–1.2)
Total Protein: 7.6 g/dL (ref 6.5–8.1)

## 2023-02-12 MED ORDER — DEXAMETHASONE SODIUM PHOSPHATE 10 MG/ML IJ SOLN
10.0000 mg | Freq: Once | INTRAMUSCULAR | Status: AC
  Administered 2023-02-12: 10 mg via INTRAVENOUS
  Filled 2023-02-12: qty 1

## 2023-02-12 MED ORDER — PALONOSETRON HCL INJECTION 0.25 MG/5ML
0.2500 mg | Freq: Once | INTRAVENOUS | Status: AC
  Administered 2023-02-12: 0.25 mg via INTRAVENOUS
  Filled 2023-02-12: qty 5

## 2023-02-12 MED ORDER — FOSAPREPITANT DIMEGLUMINE INJECTION 150 MG
150.0000 mg | Freq: Once | INTRAVENOUS | Status: AC
  Administered 2023-02-12: 150 mg via INTRAVENOUS
  Filled 2023-02-12: qty 150

## 2023-02-12 MED ORDER — INFLUENZA VAC A&B SURF ANT ADJ 0.5 ML IM SUSY
0.5000 mL | PREFILLED_SYRINGE | Freq: Once | INTRAMUSCULAR | Status: AC
  Administered 2023-02-12: 0.5 mL via INTRAMUSCULAR
  Filled 2023-02-12: qty 0.5

## 2023-02-12 MED ORDER — SODIUM CHLORIDE 0.9 % IV SOLN: Freq: Once | INTRAVENOUS | Status: AC

## 2023-02-12 MED ORDER — CYANOCOBALAMIN 1000 MCG/ML IJ SOLN
1000.0000 ug | Freq: Once | INTRAMUSCULAR | Status: AC
Start: 2023-02-12 — End: 2023-02-12
  Administered 2023-02-12: 1000 ug via INTRAMUSCULAR
  Filled 2023-02-12: qty 1

## 2023-02-12 MED ORDER — CARBOPLATIN CHEMO INJECTION 600 MG/60ML
422.5000 mg | Freq: Once | INTRAVENOUS | Status: AC
  Administered 2023-02-12: 420 mg via INTRAVENOUS
  Filled 2023-02-12: qty 42

## 2023-02-12 MED ORDER — PEMETREXED DISODIUM CHEMO INJECTION 500 MG
500.0000 mg/m2 | Freq: Once | INTRAVENOUS | Status: AC
  Administered 2023-02-12: 900 mg via INTRAVENOUS
  Filled 2023-02-12: qty 20

## 2023-02-12 NOTE — Progress Notes (Signed)
Brief follow up with patient during infusion. Patient is receiving Carbo/Alimta and followed by Dr. Arbutus Ped.  Patient weighed 136 pounds 9.6 oz today decreased from 142.7 pounds on July 30. This is a 4% weight loss over 3 months.  Labs reviewed.  Patient continues to work with GI regarding ongoing diarrhea. He no longer uses his feeding tube and is eating exclusively by mouth. He denies problems other than he is unable to gain weight. He will keep his feeding tube while undergoing treatment. He denies needs or questions.  No follow up scheduled. Patient and wife have RD contact number. Please send RD consult if needed.

## 2023-02-12 NOTE — Progress Notes (Signed)
[facility] Telephone:(336) [phone]   Fax:(336) [phone]  OFFICE PROGRESS NOTE  [name], MD [address] [place] [address]  DIAGNOSIS:  1) recurrent non-small cell lung cancer, adenocarcinoma with positive mediastinal lymph node in December 2023 that was initially diagnosed as stage IIB (T1 a, N1, M0) non-small cell lung cancer, adenocarcinoma diagnosed in June 2021.  The patient also has evidence for recurrent disease in the mediastinal lymph node in January 2024 and in the right lower lobe in August 2024. 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  Biomarker Findings Tumor Mutational Burden - 20 Muts/Mb Microsatellite status - MS-Stable Genomic Findings For a complete list of the genes assayed, please refer to the Appendix. KEAP1 I422fs*17 DNMT3A E817* RB1 splice site 1960+5G>C TP53 R209* 8 Disease relevant genes with no reportable alterations: ALK, BRAF, EGFR, ERBB2, KRAS, MET, RET, ROS1  PDL1 Expression: 1 %  PRIOR THERAPY:  1) status post left upper lobectomy with lymph node dissection on December 16, 2019 under the care of Dr. Cliffton Asters.  The tumor size measured 0.9 cm but there was involvement of the level 10 L and 11 L.  2) Adjuvant systemic chemotherapy with cisplatin 75 mg/M2 and Alimta 500 mg/M2 every 3 weeks.  First dose February 04, 2020.  Status post 4 cycles. 3) Neoadjuvant treatment with imatinib 400 mg p.o. daily for the GIST tumor of the pelvic area.  First dose started July 18, 2020.  Status post 12 months of treatment. 4) status post robotic assisted very low anterior rectosigmoid resection with coloanal anastomosis, diverting loop ileostomy and wedge liver biopsy under the care of Dr. Michaell Cowing on December 14, 2021 and it showed minimal residual gastrointestinal stromal tumor. 5) concurrent chemoradiation with weekly carboplatin for AUC of 2 and paclitaxel 45 Mg/M2.  First dose May 14, 2022. 6) Imatinib 400 mg p.o.  daily  CURRENT THERAPY: Systemic chemotherapy with carboplatin for AUC of 5 and Alimta 500 Mg/M2 every 3 weeks.  First dose 12/25/2022.  Status post 2 cycles.  INTERVAL HISTORY: Jesse [name] 72 y.o. male returns to the clinic today for follow-up visit accompanied by his wife. Discussed the use of AI scribe software for clinical note transcription with the patient, who gave verbal consent to proceed.  History of Present Illness   The patient, a 72 year old with a history of lung cancer and gastrointestinal stromal tumor (GIST), has been on a treatment regimen of carboplatin and Alimta after disease recurrence in August 2024. He has completed two cycles of this treatment and is due for the third. Over the past three weeks, he reports increased fatigue following the second cycle of chemotherapy, more so than after the first cycle. However, he denies experiencing any nausea, vomiting, chest pain, shortness of breath, or cough. His weight has remained stable. He has also completed more than two years of Gleevec for GIST and underwent surgery for the same.       MEDICAL HISTORY: Past Medical History:  Diagnosis Date   Anemia    Cancer (HCC)    bladder cancer   COPD (chronic obstructive pulmonary disease) (HCC)    mild    DIVERTICULOSIS, COLON 01/14/2007   Qualifier: Diagnosis of  By: Marcelyn Ditty RN, Katy Fitch    GASTRITIS, CHRONIC 06/01/2004   Qualifier: Diagnosis of  By: Creta Levin CMA (AAMA), Robin     GERD 01/09/2007   Qualifier: Diagnosis of  By: Tawanna Cooler RN, Ellen     Heart murmur  as a child; no murmur heard 12/14/19   History of radiation therapy    Mediastinum- 05/15/22-06/28/22-Dr. Antony Blackbird   HYPERLIPIDEMIA 01/09/2007   Qualifier: Diagnosis of  By: Tawanna Cooler RN, Alvino Chapel     Hypertension    Lung mass    left upper nodule   Macular degeneration    right eye   NEOPLASM, MALIGNANT, BLADDER, HX OF 2002   Qualifier: Diagnosis of  By: Creta Levin CMA (AAMA), Robin  / no chemo or radiation    OSTEOARTHRITIS 01/14/2007   Qualifier: Diagnosis of  By: Marcelyn Ditty, RN, Katy Fitch - knees   Rectal mass    Reflux esophagitis 06/01/2004   Qualifier: Diagnosis of  By: Creta Levin CMA (AAMA), Robin     Stroke [facility])    TOBACCO USER 02/16/2009   Qualifier: Diagnosis of  By: Amador Cunas  MD, Janett Labella    VITAMIN D DEFICIENCY 06/03/2007   Qualifier: Diagnosis of  By: Amador Cunas  MD, Janett Labella     ALLERGIES:  has No Known Allergies.  MEDICATIONS:  Current Outpatient Medications  Medication Sig Dispense Refill   acetaminophen (TYLENOL) 500 MG tablet Take 1,000 mg by mouth every 8 (eight) hours as needed for moderate pain.     aspirin 81 MG chewable tablet Chew 1 tablet (81 mg total) by mouth daily.     atorvastatin (LIPITOR) 20 MG tablet Take 1 tablet (20 mg total) by mouth daily. 90 tablet 3   Cholecalciferol (VITAMIN D3 PO) Take 2,000 Units by mouth daily.     diphenoxylate-atropine (LOMOTIL) 2.5-0.025 MG tablet Take 2 tablets by mouth 3 (three) times daily as needed for diarrhea or loose stools. 90 tablet 0   ferrous sulfate 325 (65 FE) MG EC tablet Take 1 tablet (325 mg total) by mouth 2 (two) times daily. 60 tablet 1   folic acid (FOLVITE) 1 MG tablet Take 1 tablet (1 mg total) by mouth daily. 30 tablet 4   lidocaine (XYLOCAINE) 5 % ointment Apply 1 Application topically 3 (three) times daily as needed for mild pain or moderate pain. 35.44 g 3   loperamide (IMODIUM A-D) 2 MG tablet Take 4 mg by mouth 4 (four) times daily as needed for diarrhea or loose stools.     mirtazapine (REMERON SOL-TAB) 30 MG disintegrating tablet TAKE 1 TABLET BY MOUTH AT BEDTIME. 90 tablet 1   Multiple Vitamins-Minerals (PRESERVISION AREDS 2) CAPS Take 1 capsule by mouth 2 (two) times daily.     OVER THE COUNTER MEDICATION Take 1 tablet by mouth daily. PROBIOTICS     pantoprazole (PROTONIX) 40 MG tablet Take 1 tablet (40 mg total) by mouth 2 (two) times daily. 60 tablet 3   prochlorperazine (COMPAZINE) 10 MG tablet Take 1  tablet (10 mg total) by mouth every 6 (six) hours as needed for nausea or vomiting. 30 tablet 0   No current facility-administered medications for this visit.    SURGICAL HISTORY:  Past Surgical History:  Procedure Laterality Date   BIOPSY  06/18/2022   Procedure: BIOPSY;  Surgeon: Shellia Cleverly, DO;  Location: [facility];  Service: Gastroenterology;;   BREAST BIOPSY Left 03/16/2022   [place] LT RADIOACTIVE SEED LOC 03/16/2022 [facility]   BRONCHIAL BIOPSY  10/13/2019   Procedure: BRONCHIAL BIOPSIES;  Surgeon: Josephine Igo, DO;  [facility]: [facility];  Service: Pulmonary;;   BRONCHIAL BIOPSY  12/04/2022   Procedure: BRONCHIAL BIOPSIES;  Surgeon: Josephine Igo, DO;  [facility]: [facility];  Service: Pulmonary;;   BRONCHIAL BRUSHINGS  10/13/2019   Procedure: BRONCHIAL BRUSHINGS;  Surgeon: Josephine Igo, DO;  [facility];  Service: Pulmonary;;   BRONCHIAL NEEDLE ASPIRATION BIOPSY  10/13/2019   Procedure: BRONCHIAL NEEDLE ASPIRATION BIOPSIES;  Surgeon: Josephine Igo, DO;  [facility];  Service: Pulmonary;;   BRONCHIAL NEEDLE ASPIRATION BIOPSY  04/11/2022   Procedure: BRONCHIAL NEEDLE ASPIRATION BIOPSIES;  Surgeon: Josephine Igo, DO;  [facility]: [facility];  Service: Cardiopulmonary;;   BRONCHIAL NEEDLE ASPIRATION BIOPSY  12/04/2022   Procedure: BRONCHIAL NEEDLE ASPIRATION BIOPSIES;  Surgeon: Josephine Igo, DO;  [facility];  Service: Pulmonary;;   BRONCHIAL WASHINGS  10/13/2019   Procedure: BRONCHIAL WASHINGS;  Surgeon: Josephine Igo, DO;  [facility];  Service: Pulmonary;;   BRONCHIAL WASHINGS  12/04/2022   Procedure: BRONCHIAL WASHINGS;  Surgeon: Josephine Igo, DO;  [facility];  Service: Pulmonary;;   COLONOSCOPY     multiple   DIVERTING ILEOSTOMY N/A 12/14/2021   Procedure: DIVERTING ILEOSTOMY;  Surgeon: Karie Soda, MD;  Location: [facility];  Service: General;  Laterality: N/A;   ENDOBRONCHIAL ULTRASOUND  Bilateral 04/11/2022   Procedure: ENDOBRONCHIAL ULTRASOUND;  Surgeon: Josephine Igo, DO;  [facility]: [facility];  Service: Cardiopulmonary;  Laterality: Bilateral;   ESOPHAGEAL DILATION  06/18/2022   Procedure: ESOPHAGEAL DILATION;  Surgeon: Shellia Cleverly, DO;  [facility];  Service: Gastroenterology;;   ESOPHAGOGASTRODUODENOSCOPY (EGD) WITH PROPOFOL N/A 06/18/2022   Procedure: ESOPHAGOGASTRODUODENOSCOPY (EGD) WITH PROPOFOL;  Surgeon: Shellia Cleverly, DO;  [facility];  Service: Gastroenterology;  Laterality: N/A;  Severe dysphagia/odynophagia ?radiation esophagitis versus oral candidiasis   FIDUCIAL MARKER PLACEMENT  12/04/2022   Procedure: FIDUCIAL MARKER PLACEMENT;  Surgeon: Josephine Igo, DO;  [facility];  Service: Pulmonary;;   ILEOSTOMY CLOSURE N/A 03/22/2022   Procedure: OPEN TAKEDOWN OF LOOP ILEOSTOMY;  Surgeon: Karie Soda, MD;  Location: [facility];  Service: General;  Laterality: N/A;  GEN w/ERAS PATHWAY LOCAL   INTERCOSTAL NERVE BLOCK Left 12/16/2019   Procedure: INTERCOSTAL NERVE BLOCK;  Surgeon: Corliss Skains, MD;  [facility];  Service: Thoracic;  Laterality: Left;   IR FLUORO RM 30-60 MIN  07/02/2022   KNEE SURGERY  07/20/2008   bilat.   NASAL SEPTUM SURGERY  1995   NODE DISSECTION Left 12/16/2019   Procedure: NODE DISSECTION;  Surgeon: Corliss Skains, MD;  [facility];  Service: Thoracic;  Laterality: Left;   PEG PLACEMENT N/A 07/04/2022   Procedure: PERCUTANEOUS ENDOSCOPIC GASTROSTOMY (PEG) PLACEMENT;  Surgeon: Iva Boop, MD;  Location: [facility];  Service: Gastroenterology;  Laterality: N/A;   RADIOACTIVE SEED GUIDED AXILLARY SENTINEL LYMPH NODE Left 03/20/2022   Procedure: RADIOACTIVE SEED GUIDED LEFT AXILLARY SENTINEL LYMPH NODE BIOPSY;  Surgeon: Harriette Bouillon, MD;  [facility];  Service: General;  Laterality: Left;   RECTAL EXAM UNDER ANESTHESIA N/A 03/22/2022   Procedure: ANORECTAL EXAMINATION UNDER  ANESTHESIA;  Surgeon: Karie Soda, MD;  Location: [facility];  Service: General;  Laterality: N/A;   TONSILLECTOMY     TRANSURETHRAL RESECTION OF BLADDER TUMOR  2002   UPPER GASTROINTESTINAL ENDOSCOPY     VIDEO BRONCHOSCOPY  04/11/2022   Procedure: VIDEO BRONCHOSCOPY;  Surgeon: Josephine Igo, DO;  [facility];  Service: Cardiopulmonary;;   VIDEO BRONCHOSCOPY WITH ENDOBRONCHIAL NAVIGATION N/A 10/13/2019   Procedure: VIDEO BRONCHOSCOPY WITH ENDOBRONCHIAL NAVIGATION;  Surgeon: Josephine Igo, DO;  [facility];  Service: Pulmonary;  Laterality: N/A;   WISDOM TOOTH EXTRACTION  XI ROBOTIC ASSISTED LOWER ANTERIOR RESECTION N/A 12/14/2021   Procedure: XI ROBOTIC ASSISTED LOWER ANTERIOR ULTRA LOW RECTOSIGMOID RESECTION, COLOANAL HAND SEWN ANASTOMOSIS, AND BILATERAL TAP BLOCK;  Surgeon: Karie Soda, MD;  Location: [facility];  Service: General;  Laterality: N/A;    REVIEW OF SYSTEMS:  A comprehensive review of systems was negative except for: Constitutional: positive for fatigue   PHYSICAL EXAMINATION: General appearance: alert, cooperative, fatigued, and no distress Head: Normocephalic, without obvious abnormality, atraumatic Neck: no adenopathy, no JVD, supple, symmetrical, trachea midline, and thyroid not enlarged, symmetric, no tenderness/mass/nodules Lymph nodes: Cervical, supraclavicular, and axillary nodes normal. Resp: clear to auscultation bilaterally Back: symmetric, no curvature. ROM normal. No CVA tenderness. Cardio: regular rate and rhythm, S1, S2 normal, no murmur, click, rub or gallop GI: soft, non-tender; bowel sounds normal; no masses,  no organomegaly Extremities: extremities normal, atraumatic, no cyanosis or edema  ECOG PERFORMANCE STATUS: 1 - Symptomatic but completely ambulatory  Blood pressure (!) 142/81, pulse 87, temperature (!) 97.4 F (36.3 C), temperature source Oral, resp. rate 17, weight 136 lb 9.6 oz (62 kg), SpO2 100%.  LABORATORY DATA: Lab  Results  Component Value Date   WBC 3.0 (L) 01/22/2023   HGB 11.6 (L) 01/22/2023   HCT 35.7 (L) 01/22/2023   MCV 88.4 01/22/2023   PLT 196 01/22/2023      Chemistry      Component Value Date/Time   NA 141 01/22/2023 0757   K 3.7 01/22/2023 0757   CL 105 01/22/2023 0757   CO2 28 01/22/2023 0757   BUN 24 (H) 01/22/2023 0757   CREATININE 0.72 01/22/2023 0757      Component Value Date/Time   CALCIUM 9.8 01/22/2023 0757   ALKPHOS 111 01/22/2023 0757   AST 20 01/22/2023 0757   ALT 34 01/22/2023 0757   BILITOT 0.3 01/22/2023 0757       RADIOGRAPHIC STUDIES: No results found.  ASSESSMENT AND PLAN: This is a very pleasant 72 years old white male recently diagnosed with a stage IIb (T1 a, N1, M0) non-small cell lung cancer, adenocarcinoma in June 2021 status post left upper lobectomy with lymph node dissection on December 16, 2019 under the care of Dr. Cliffton Asters.  The patient has no actionable mutations and PD-L1 expression was 1%. He was also diagnosed with pelvic GIST tumor. The patient completed a course of adjuvant systemic chemotherapy with cisplatin 75 mg/M2 and Alimta 500 mg/M2 every 3 weeks status post 4 cycles.  He tolerated his treatment well except for fatigue and occasional nausea. For the pelvic GIST tumor, the patient completed neoadjuvant treatment with imatinib 400 mg p.o. daily in April 2023.  Status post 24 months.   The patient underwent surgical resection of the remaining gastrointestinal stromal tumor under the care of Dr. Michaell Cowing on December 14, 2021 with very minimal residual disease.  He resumed his treatment with imatinib and tolerating it fairly well. Repeat CT scan of the chest for restaging of his lung cancer. There was increased density of soft tissue in the left hilar region concerning for possible developing lymphadenopathy or disease recurrence.  A PET scan was performed recently and that showed new hypermetabolic left axillary, mediastinal and left hilar  adenopathy compatible with recurrent metastatic lung cancer no hypermetabolic metastatic disease in the neck, abdomen, pelvis or skeleton. He underwent excisional biopsy of the left axillary lymph node that was not conclusive for malignancy. The patient was seen by Dr. Tonia Brooms and repeat video bronchoscopy with EBUS on April 11, 2022 and biopsy from the station 7 lymph node showed recurrent adenocarcinoma. I had a lengthy discussion with the patient today about his current condition and treatment options. I recommended for the patient a course of concurrent chemoradiation with weekly carboplatin for AUC of 2 and paclitaxel 45 Mg/M2 for 6-7 weeks.  Status post 3 cycles of chemotherapy but he completed the course of radiation.  The patient was admitted to the hospital on June 07, 2022 with significant sepsis and Klebsiella pneumonia bacteremia.  He continues to have persistent diarrhea despite discontinuation of imatinib and treatment with cholestyramine.  This is likely functional diarrhea after his surgical resection for the pelvic GIST tumor. Repeat PET scan showed no concerning findings for disease progression except for suspicious slowly growing adenocarcinoma in the right lower lobe but inflammatory changes in the right lung as well as suspicious reactive mediastinal lymphadenopathy.  The patient had repeat bronchoscopy by Dr. Tonia Brooms and the final pathology was consistent with recurrent non-small cell lung cancer. The patient started systemic chemotherapy with carboplatin for AUC of 5 and Alimta 500 Mg/M2 on 12/25/2022.  Status post 2 cycles.  The patient has been tolerating his treatment fairly well.     Lung Cancer Recurrence in August 2024, currently on cycle 3 of Carboplatin and Alimta. Tolerating treatment with increased fatigue after second cycle. No new respiratory symptoms. -Continue Carboplatin and Alimta for cycle 3 today. -Plan for a total of 4-6 cycles of combined therapy, then  consider maintenance Alimta alone if response is good. -Schedule CT scan in 2 weeks to assess response to treatment.  Gastrointestinal Stromal Tumor Diagnosed in September 2021, completed 2 years of Gleevec and had surgical resection. No current issues reported. -Continue surveillance.  Hypertension Blood pressure slightly elevated today. -Monitor blood pressure.  Follow-up in 3 weeks for cycle 4 of chemotherapy.   For the diarrhea, he will continue his routine follow-up visit and evaluation by Dr. Leone Payor. The patient was advised to call immediately if he has any concerning symptoms in the interval. The patient voices understanding of current disease status and treatment options and is in agreement with the current care plan.  All questions were answered. The patient knows to call the clinic with any problems, questions or concerns. We can certainly see the patient much sooner if necessary. The total time spent in the appointment was 20 minutes.  Disclaimer: This note was dictated with voice recognition software. Similar sounding words can inadvertently be transcribed and may not be corrected upon review.

## 2023-02-18 DIAGNOSIS — M9903 Segmental and somatic dysfunction of lumbar region: Secondary | ICD-10-CM | POA: Diagnosis not present

## 2023-02-18 DIAGNOSIS — M5431 Sciatica, right side: Secondary | ICD-10-CM | POA: Diagnosis not present

## 2023-02-25 DIAGNOSIS — M5431 Sciatica, right side: Secondary | ICD-10-CM | POA: Diagnosis not present

## 2023-02-25 DIAGNOSIS — M9903 Segmental and somatic dysfunction of lumbar region: Secondary | ICD-10-CM | POA: Diagnosis not present

## 2023-02-26 ENCOUNTER — Other Ambulatory Visit: Payer: Self-pay | Admitting: Internal Medicine

## 2023-02-26 ENCOUNTER — Ambulatory Visit (HOSPITAL_COMMUNITY)
Admission: RE | Admit: 2023-02-26 | Discharge: 2023-02-26 | Disposition: A | Discharge status: Home / Self Care | Payer: Medicare Other | Source: Ambulatory Visit | Attending: Internal Medicine | Admitting: Internal Medicine

## 2023-02-26 ENCOUNTER — Other Ambulatory Visit: Payer: Self-pay

## 2023-02-26 ENCOUNTER — Inpatient Hospital Stay: Payer: Medicare Other | Attending: Internal Medicine

## 2023-02-26 DIAGNOSIS — Z87891 Personal history of nicotine dependence: Secondary | ICD-10-CM | POA: Insufficient documentation

## 2023-02-26 DIAGNOSIS — C3431 Malignant neoplasm of lower lobe, right bronchus or lung: Secondary | ICD-10-CM

## 2023-02-26 DIAGNOSIS — Z5111 Encounter for antineoplastic chemotherapy: Secondary | ICD-10-CM | POA: Insufficient documentation

## 2023-02-26 DIAGNOSIS — C3412 Malignant neoplasm of upper lobe, left bronchus or lung: Secondary | ICD-10-CM | POA: Insufficient documentation

## 2023-02-26 DIAGNOSIS — C3492 Malignant neoplasm of unspecified part of left bronchus or lung: Secondary | ICD-10-CM

## 2023-02-26 DIAGNOSIS — C3490 Malignant neoplasm of unspecified part of unspecified bronchus or lung: Secondary | ICD-10-CM

## 2023-02-26 DIAGNOSIS — I700 Atherosclerosis of aorta: Secondary | ICD-10-CM | POA: Diagnosis not present

## 2023-02-26 DIAGNOSIS — C771 Secondary and unspecified malignant neoplasm of intrathoracic lymph nodes: Secondary | ICD-10-CM | POA: Insufficient documentation

## 2023-02-26 LAB — CMP (CANCER CENTER ONLY)
ALT: 26 U/L (ref 0–44)
AST: 23 U/L (ref 15–41)
Albumin: 3.8 g/dL (ref 3.5–5.0)
Alkaline Phosphatase: 106 U/L (ref 38–126)
Anion gap: 6 (ref 5–15)
BUN: 24 mg/dL — ABNORMAL HIGH (ref 8–23)
CO2: 31 mmol/L (ref 22–32)
Calcium: 9.5 mg/dL (ref 8.9–10.3)
Chloride: 103 mmol/L (ref 98–111)
Creatinine: 0.72 mg/dL (ref 0.61–1.24)
GFR, Estimated: >60 mL/min (ref >60)
Glucose, Bld: 97 mg/dL (ref 70–99)
Potassium: 3.8 mmol/L (ref 3.5–5.1)
Sodium: 140 mmol/L (ref 135–145)
Total Bilirubin: 0.4 mg/dL (ref <1.2)
Total Protein: 7 g/dL (ref 6.5–8.1)

## 2023-02-26 LAB — CBC WITH DIFFERENTIAL (CANCER CENTER ONLY)
Abs Immature Granulocytes: 0.01 10*3/uL (ref 0.00–0.07)
Basophils Absolute: 0 10*3/uL (ref 0.0–0.1)
Basophils Relative: 0 %
Eosinophils Absolute: 0 10*3/uL (ref 0.0–0.5)
Eosinophils Relative: 1 %
HCT: 29 % — ABNORMAL LOW (ref 39.0–52.0)
Hemoglobin: 9.3 g/dL — ABNORMAL LOW (ref 13.0–17.0)
Immature Granulocytes: 0 %
Lymphocytes Relative: 21 %
Lymphs Abs: 0.7 10*3/uL (ref 0.7–4.0)
MCH: 28.9 pg (ref 26.0–34.0)
MCHC: 32.1 g/dL (ref 30.0–36.0)
MCV: 90.1 fL (ref 80.0–100.0)
Monocytes Absolute: 0.4 10*3/uL (ref 0.1–1.0)
Monocytes Relative: 14 %
Neutro Abs: 2 10*3/uL (ref 1.7–7.7)
Neutrophils Relative %: 64 %
Platelet Count: 126 10*3/uL — ABNORMAL LOW (ref 150–400)
RBC: 3.22 MIL/uL — ABNORMAL LOW (ref 4.22–5.81)
RDW: 17.6 % — ABNORMAL HIGH (ref 11.5–15.5)
WBC Count: 3.1 10*3/uL — ABNORMAL LOW (ref 4.0–10.5)
nRBC: 0 % (ref 0.0–0.2)

## 2023-02-26 MED ORDER — IOHEXOL 300 MG/ML  SOLN
100.0000 mL | Freq: Once | INTRAMUSCULAR | Status: AC | PRN
  Administered 2023-02-26: 100 mL via INTRAVENOUS

## 2023-03-03 ENCOUNTER — Other Ambulatory Visit: Payer: Self-pay | Admitting: Internal Medicine

## 2023-03-03 NOTE — Progress Notes (Unsigned)
[facility] OFFICE PROGRESS NOTE  Jesse Saint, MD [address] [place] [address]  DIAGNOSIS: 1) recurrent non-small cell lung cancer, adenocarcinoma with positive mediastinal lymph node in December 2023 that was initially diagnosed as stage IIB (T1 a, N1, M0) non-small cell lung cancer, adenocarcinoma diagnosed in June 2021.  The patient also has evidence for recurrent disease in the mediastinal lymph node in January 2024 and in the right lower lobe in August 2024. 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   Biomarker Findings Tumor Mutational Burden - 20 Muts/Mb Microsatellite status - MS-Stable Genomic Findings For a complete list of the genes assayed, please refer to the Appendix. KEAP1 I453fs*17 DNMT3A E817* RB1 splice site 1960+5G>C TP53 R209* 8 Disease relevant genes with no reportable alterations: ALK, BRAF, EGFR, ERBB2, KRAS, MET, RET, ROS1   PDL1 Expression: 1 %  PRIOR THERAPY: 1) status post left upper lobectomy with lymph node dissection on December 16, 2019 under the care of Dr. Cliffton Asters.  The tumor size measured 0.9 cm but there was involvement of the level 10 L and 11 L.  2) Adjuvant systemic chemotherapy with cisplatin 75 mg/M2 and Alimta 500 mg/M2 every 3 weeks.  First dose February 04, 2020.  Status post 4 cycles. 3) Neoadjuvant treatment with imatinib 400 mg p.o. daily for the GIST tumor of the pelvic area.  First dose started July 18, 2020.  Status post 12 months of treatment. 4) status post robotic assisted very low anterior rectosigmoid resection with coloanal anastomosis, diverting loop ileostomy and wedge liver biopsy under the care of Dr. Michaell Cowing on December 14, 2021 and it showed minimal residual gastrointestinal stromal tumor. 5) concurrent chemoradiation with weekly carboplatin for AUC of 2 and paclitaxel 45 Mg/M2.  First dose May 14, 2022. 6) Imatinib 400 mg p.o. daily  CURRENT THERAPY: Systemic chemotherapy with  carboplatin for AUC of 5 and Alimta 500 Mg/M2 every 3 weeks.  First dose 12/25/2022.  Status post 3 cycles.   INTERVAL HISTORY: Jesse [name] 72 y.o. male returns to the clinic today for a follow-up visit accompanied by his wife.  The patient was last seen by Dr. Arbutus Ped on 02/12/2023.  The patient is currently undergoing treatment with carboplatin and Alimta for disease recurrence in the opposite lung which was found in August 2024.  He is status post 3 cycles and is tolerated it fair.  He states that overall his last round of treatment was "the best one yet".  He states that he is feeling "not terrible" today.  He has some fatigue and continued nutritional issues.  The patient has a good appetite but does struggle to gain weight.  However he has not lost any weight recently but continues to be cachectic appearing.  He previously had a feeding tube.  He does not follow with nutrition on a regular basis since he no longer has a feeding tube.  He denies any fever, chills, or night sweats. He denies any chest pain, cough, or hemoptysis.  He may occasionally have mild dyspnea on exertion.  Denies any nausea or vomiting.  He is following closely with Dr. Leone Payor for his gastroenterology concerns.  He has an upcoming appointment on 03/21/2023.  The patient reports that he has frequent bowel movements but they are often hard and he does not produce a large volume of stool.  He denies any headache or vision changes.  He is followed closely by palliative care and is scheduled to see them on  04/16/23. They are asking for clarification for how many infusions he will need.  He recently had a restaging CT scan performed.  He is here today for evaluation repeat blood work before undergoing cycle #4.      MEDICAL HISTORY: Past Medical History:  Diagnosis Date   Anemia    Cancer (HCC)    bladder cancer   COPD (chronic obstructive pulmonary disease) (HCC)    mild    DIVERTICULOSIS, COLON 01/14/2007   Qualifier:  Diagnosis of  By: Marcelyn Ditty RN, Katy Fitch    GASTRITIS, CHRONIC 06/01/2004   Qualifier: Diagnosis of  By: Creta Levin CMA (AAMA), Robin     GERD 01/09/2007   Qualifier: Diagnosis of  By: Tawanna Cooler, RN, Ellen     Heart murmur    as a child; no murmur heard 12/14/19   History of radiation therapy    Mediastinum- 05/15/22-06/28/22-Dr. Antony Blackbird   HYPERLIPIDEMIA 01/09/2007   Qualifier: Diagnosis of  By: Everett Graff     Hypertension    Lung mass    left upper nodule   Macular degeneration    right eye   NEOPLASM, MALIGNANT, BLADDER, HX OF 2002   Qualifier: Diagnosis of  By: Creta Levin CMA (AAMA), Robin  / no chemo or radiation   OSTEOARTHRITIS 01/14/2007   Qualifier: Diagnosis of  By: Marcelyn Ditty, RN, Katy Fitch - knees   Rectal mass    Reflux esophagitis 06/01/2004   Qualifier: Diagnosis of  By: Creta Levin CMA (AAMA), Robin     Stroke [facility])    TOBACCO USER 02/16/2009   Qualifier: Diagnosis of  By: Amador Cunas  MD, Janett Labella    VITAMIN D DEFICIENCY 06/03/2007   Qualifier: Diagnosis of  By: Amador Cunas  MD, Janett Labella     ALLERGIES:  has No Known Allergies.  MEDICATIONS:  Current Outpatient Medications  Medication Sig Dispense Refill   acetaminophen (TYLENOL) 500 MG tablet Take 1,000 mg by mouth every 8 (eight) hours as needed for moderate pain.     aspirin 81 MG chewable tablet Chew 1 tablet (81 mg total) by mouth daily.     atorvastatin (LIPITOR) 20 MG tablet Take 1 tablet (20 mg total) by mouth daily. 90 tablet 3   Cholecalciferol (VITAMIN D3 PO) Take 2,000 Units by mouth daily.     diphenoxylate-atropine (LOMOTIL) 2.5-0.025 MG tablet Take 2 tablets by mouth 3 (three) times daily as needed for diarrhea or loose stools. 90 tablet 0   ferrous sulfate 325 (65 FE) MG EC tablet Take 1 tablet (325 mg total) by mouth 2 (two) times daily. 60 tablet 1   folic acid (FOLVITE) 1 MG tablet TAKE 1 TABLET BY MOUTH EVERY DAY 90 tablet 1   lidocaine (XYLOCAINE) 5 % ointment Apply 1 Application topically 3 (three)  times daily as needed for mild pain or moderate pain. 35.44 g 3   loperamide (IMODIUM A-D) 2 MG tablet Take 4 mg by mouth 4 (four) times daily as needed for diarrhea or loose stools.     mirtazapine (REMERON SOL-TAB) 30 MG disintegrating tablet TAKE 1 TABLET BY MOUTH AT BEDTIME. 90 tablet 1   Multiple Vitamins-Minerals (PRESERVISION AREDS 2) CAPS Take 1 capsule by mouth 2 (two) times daily.     OVER THE COUNTER MEDICATION Take 1 tablet by mouth daily. PROBIOTICS     pantoprazole (PROTONIX) 40 MG tablet Take 1 tablet (40 mg total) by mouth 2 (two) times daily. 60 tablet 3   prochlorperazine (COMPAZINE) 10 MG tablet Take 1  tablet (10 mg total) by mouth every 6 (six) hours as needed for nausea or vomiting. 30 tablet 0   No current facility-administered medications for this visit.    SURGICAL HISTORY:  Past Surgical History:  Procedure Laterality Date   BIOPSY  06/18/2022   Procedure: BIOPSY;  Surgeon: Shellia Cleverly, DO;  Location: [facility];  Service: Gastroenterology;;   BREAST BIOPSY Left 03/16/2022   [place] LT RADIOACTIVE SEED LOC 03/16/2022 [facility]   BRONCHIAL BIOPSY  10/13/2019   Procedure: BRONCHIAL BIOPSIES;  Surgeon: Josephine Igo, DO;  [facility]: [facility];  Service: Pulmonary;;   BRONCHIAL BIOPSY  12/04/2022   Procedure: BRONCHIAL BIOPSIES;  Surgeon: Josephine Igo, DO;  [facility];  Service: Pulmonary;;   BRONCHIAL BRUSHINGS  10/13/2019   Procedure: BRONCHIAL BRUSHINGS;  Surgeon: Josephine Igo, DO;  [facility];  Service: Pulmonary;;   BRONCHIAL NEEDLE ASPIRATION BIOPSY  10/13/2019   Procedure: BRONCHIAL NEEDLE ASPIRATION BIOPSIES;  Surgeon: Josephine Igo, DO;  [facility];  Service: Pulmonary;;   BRONCHIAL NEEDLE ASPIRATION BIOPSY  04/11/2022   Procedure: BRONCHIAL NEEDLE ASPIRATION BIOPSIES;  Surgeon: Josephine Igo, DO;  [facility]: [facility];  Service: Cardiopulmonary;;   BRONCHIAL NEEDLE ASPIRATION BIOPSY  12/04/2022    Procedure: BRONCHIAL NEEDLE ASPIRATION BIOPSIES;  Surgeon: Josephine Igo, DO;  [facility];  Service: Pulmonary;;   BRONCHIAL WASHINGS  10/13/2019   Procedure: BRONCHIAL WASHINGS;  Surgeon: Josephine Igo, DO;  [facility];  Service: Pulmonary;;   BRONCHIAL WASHINGS  12/04/2022   Procedure: BRONCHIAL WASHINGS;  Surgeon: Josephine Igo, DO;  [facility];  Service: Pulmonary;;   COLONOSCOPY     multiple   DIVERTING ILEOSTOMY N/A 12/14/2021   Procedure: DIVERTING ILEOSTOMY;  Surgeon: Karie Soda, MD;  Location: [facility];  Service: General;  Laterality: N/A;   ENDOBRONCHIAL ULTRASOUND Bilateral 04/11/2022   Procedure: ENDOBRONCHIAL ULTRASOUND;  Surgeon: Josephine Igo, DO;  Location: [facility];  Service: Cardiopulmonary;  Laterality: Bilateral;   ESOPHAGEAL DILATION  06/18/2022   Procedure: ESOPHAGEAL DILATION;  Surgeon: Shellia Cleverly, DO;  [facility];  Service: Gastroenterology;;   ESOPHAGOGASTRODUODENOSCOPY (EGD) WITH PROPOFOL N/A 06/18/2022   Procedure: ESOPHAGOGASTRODUODENOSCOPY (EGD) WITH PROPOFOL;  Surgeon: Shellia Cleverly, DO;  [facility]: [facility];  Service: Gastroenterology;  Laterality: N/A;  Severe dysphagia/odynophagia ?radiation esophagitis versus oral candidiasis   FIDUCIAL MARKER PLACEMENT  12/04/2022   Procedure: FIDUCIAL MARKER PLACEMENT;  Surgeon: Josephine Igo, DO;  [facility];  Service: Pulmonary;;   ILEOSTOMY CLOSURE N/A 03/22/2022   Procedure: OPEN TAKEDOWN OF LOOP ILEOSTOMY;  Surgeon: Karie Soda, MD;  Location: [facility];  Service: General;  Laterality: N/A;  GEN w/ERAS PATHWAY LOCAL   INTERCOSTAL NERVE BLOCK Left 12/16/2019   Procedure: INTERCOSTAL NERVE BLOCK;  Surgeon: Corliss Skains, MD;  [facility];  Service: Thoracic;  Laterality: Left;   IR FLUORO RM 30-60 MIN  07/02/2022   KNEE SURGERY  07/20/2008   bilat.   NASAL SEPTUM SURGERY  1995   NODE DISSECTION Left 12/16/2019   Procedure: NODE  DISSECTION;  Surgeon: Corliss Skains, MD;  [facility];  Service: Thoracic;  Laterality: Left;   PEG PLACEMENT N/A 07/04/2022   Procedure: PERCUTANEOUS ENDOSCOPIC GASTROSTOMY (PEG) PLACEMENT;  Surgeon: Iva Boop, MD;  Location: [facility];  Service: Gastroenterology;  Laterality: N/A;   RADIOACTIVE SEED GUIDED AXILLARY SENTINEL LYMPH NODE Left 03/20/2022   Procedure: RADIOACTIVE SEED GUIDED LEFT AXILLARY SENTINEL  LYMPH NODE BIOPSY;  Surgeon: Harriette Bouillon, MD;  [facility];  Service: General;  Laterality: Left;   RECTAL EXAM UNDER ANESTHESIA N/A 03/22/2022   Procedure: ANORECTAL EXAMINATION UNDER ANESTHESIA;  Surgeon: Karie Soda, MD;  Location: [facility];  Service: General;  Laterality: N/A;   TONSILLECTOMY     TRANSURETHRAL RESECTION OF BLADDER TUMOR  2002   UPPER GASTROINTESTINAL ENDOSCOPY     VIDEO BRONCHOSCOPY  04/11/2022   Procedure: VIDEO BRONCHOSCOPY;  Surgeon: Josephine Igo, DO;  [facility]: [facility];  Service: Cardiopulmonary;;   VIDEO BRONCHOSCOPY WITH ENDOBRONCHIAL NAVIGATION N/A 10/13/2019   Procedure: VIDEO BRONCHOSCOPY WITH ENDOBRONCHIAL NAVIGATION;  Surgeon: Josephine Igo, DO;  [facility];  Service: Pulmonary;  Laterality: N/A;   WISDOM TOOTH EXTRACTION     XI ROBOTIC ASSISTED LOWER ANTERIOR RESECTION N/A 12/14/2021   Procedure: XI ROBOTIC ASSISTED LOWER ANTERIOR ULTRA LOW RECTOSIGMOID RESECTION, COLOANAL HAND SEWN ANASTOMOSIS, AND BILATERAL TAP BLOCK;  Surgeon: Karie Soda, MD;  Location: [facility];  Service: General;  Laterality: N/A;    REVIEW OF SYSTEMS:   Review of Systems  Constitutional: Positive for stable fatigue.  Negative for appetite change, chills,  fever and unexpected weight change.  HENT:  Negative for mouth sores, nosebleeds, sore throat and trouble swallowing.   Eyes: Negative for eye problems and icterus.  Respiratory: Positive for mild occasional dyspnea on exertion.  Negative for cough, hemoptysis, and wheezing.    Cardiovascular: Negative for chest pain and leg swelling.  Gastrointestinal: Positive for stable irregular bowel movements with increased frequency of bowel movements but produces small volume and hard stool.  Negative for nausea and vomiting.  Genitourinary: Negative for bladder incontinence, difficulty urinating, dysuria, frequency and hematuria.   Musculoskeletal: Negative for back pain, gait problem, neck pain and neck stiffness.  Skin: Negative for itching and rash.  Neurological: Negative for dizziness, extremity weakness, gait problem, headaches, light-headedness and seizures.  Hematological: Negative for adenopathy. Does not bruise/bleed easily.  Psychiatric/Behavioral: Negative for confusion, depression and sleep disturbance. The patient is not nervous/anxious.     PHYSICAL EXAMINATION:  Blood pressure 128/85, pulse (!) 104, temperature 98.2 F (36.8 C), temperature source Temporal, resp. rate 17, weight 136 lb (61.7 kg), SpO2 98%.  ECOG PERFORMANCE STATUS: 2  Physical Exam  Constitutional: Oriented to person, place, and time and cachetic appearing male and in no distress.  HENT:  Head: Normocephalic and atraumatic.  Mouth/Throat: Oropharynx is clear and moist. No oropharyngeal exudate.  Eyes: Conjunctivae are normal. Right eye exhibits no discharge. Left eye exhibits no discharge. No scleral icterus.  Neck: Normal range of motion. Neck supple.  Cardiovascular: Normal rate, regular rhythm, normal heart sounds and intact distal pulses.   Pulmonary/Chest: Effort normal and breath sounds normal. No respiratory distress. No wheezes. No rales.  Abdominal: Soft. Bowel sounds are normal. Exhibits no distension and no mass. There is no tenderness.  Musculoskeletal: Normal range of motion. Exhibits no edema.  Lymphadenopathy:    No cervical adenopathy.  Neurological: Alert and oriented to person, place, and time. Exhibits muscle wasting. Gait normal. Coordination normal.  Skin: Skin  is warm and dry. No rash noted. Not diaphoretic. No erythema. No pallor.  Psychiatric: Mood, memory and judgment normal.  Vitals reviewed.  LABORATORY DATA: Lab Results  Component Value Date   WBC 3.6 (L) 03/05/2023   HGB 10.1 (L) 03/05/2023   HCT 31.0 (L) 03/05/2023   MCV 92.5 03/05/2023   PLT 238 03/05/2023      Chemistry  Component Value Date/Time   NA 139 03/05/2023 1107   K 3.8 03/05/2023 1107   CL 104 03/05/2023 1107   CO2 30 03/05/2023 1107   BUN 31 (H) 03/05/2023 1107   CREATININE 0.76 03/05/2023 1107      Component Value Date/Time   CALCIUM 9.8 03/05/2023 1107   ALKPHOS 108 03/05/2023 1107   AST 34 03/05/2023 1107   ALT 35 03/05/2023 1107   BILITOT 0.3 03/05/2023 1107       RADIOGRAPHIC STUDIES:  CT CHEST ABDOMEN PELVIS W CONTRAST  Result Date: 03/05/2023 CLINICAL DATA:  Non-small cell lung cancer. * Tracking Code: BO *. Recurrent non-small cell lung carcinoma. Recurrence diagnosed in August 2024 bronchoscopy after suspicious FDG PET scan. Current on chemotherapy. EXAM: CT CHEST, ABDOMEN, AND PELVIS WITH CONTRAST TECHNIQUE: Multidetector CT imaging of the chest, abdomen and pelvis was performed following the standard protocol during bolus administration of intravenous contrast. RADIATION DOSE REDUCTION: This exam was performed according to the departmental dose-optimization program which includes automated exposure control, adjustment of the mA and/or kV according to patient size and/or use of iterative reconstruction technique. CONTRAST:  OMNIPAQUE IOHEXOL 300 MG/ML  SOLN COMPARISON:  FDG PET scan 10/01/2022 FINDINGS: CT CHEST FINDINGS Cardiovascular: Coronary artery calcification and aortic atherosclerotic calcification. Mediastinum/Nodes: Stable small mediastinal lymph nodes. No pericardial fluid. No supraclavicular axillary adenopathy. Lungs/Pleura: Post surgical findings in the LEFT lung with LEFT upper lobectomy and radiation change about the hilum. No  change comparison from CT. Small LEFT effusion is similar prior. No new LEFT pulmonary nodules. Two nodules persist in the RIGHT lower lobe. 10 mm nodule on image 108/4 compares to 12 mm. Adjacent 12 mm nodule on image 113) compares to 10 mm. RIGHT upper lobe pulmonary nodule measuring 8 mm (image 26/4 compares to 8 mm. Interval improvement in inflammatory thickening within the RIGHT lower lobe. No new RIGHT lung pulmonary nodules. Musculoskeletal: No aggressive osseous lesion. CT ABDOMEN AND PELVIS FINDINGS Hepatobiliary: No focal hepatic lesion. No biliary ductal dilatation. Gallbladder is normal. Common bile duct is normal. Pancreas: Pancreas is normal. No ductal dilatation. No pancreatic inflammation. Spleen: Normal spleen Adrenals/urinary tract: Small nodule enlargement of the LEFT adrenal gland to 13 mm unchanged. No significant metabolic activity on prior PET scan. RIGHT adrenal gland normal. Kidneys, ureters and bladder normal. Stomach/Bowel: Percutaneous gastrostomy tube in the stomach. Small bowel anastomosis in the RIGHT upper quadrant. No evidence of bowel obstruction. No acute findings in the bowel. Vascular/Lymphatic: Abdominal aorta is normal caliber with atherosclerotic calcification. There is no retroperitoneal or periportal lymphadenopathy. No pelvic lymphadenopathy. Reproductive: Prostate unremarkable Other: No free fluid. Musculoskeletal: No aggressive osseous lesion. IMPRESSION: CHEST: 1. Stable post surgical findings in the LEFT lung. 2. Stable RIGHT lung pulmonary nodules. 3. No new pulmonary nodules. 4. Stable small mediastinal lymph nodes. PELVIS: 1. No evidence of metastatic disease in the abdomen pelvis. 2. Stable LEFT adrenal nodule. 3.  Aortic Atherosclerosis (ICD10-I70.0). Electronically Signed   By: Genevive Bi M.D.   On: 03/05/2023 09:03     ASSESSMENT/PLAN:  This is a very pleasant 72 year old Caucasian male diagnosed with recurrent disease but he was initially diagnosed as  stage IIIb (T1a, N1, M0) non-small cell lung cancer, adenocarcinoma.  He was initially diagnosed in June 2021 and is status post left upper lobectomy with lymph node dissection on 12/16/2019 to the care of Dr. Cliffton Asters.  The patient has no actionable mutations and his PD-L1 expression is 1%.   For his lung cancer, the patient  initially completed adjuvant systemic chemotherapy with cisplatin 75 mg/m and Alimta 500 mg/m IV every 3 weeks status post 4 cycles.    For the pelvic GIST tumor, the patient completed neoadjuvant treatment with imatinib 400 mg p.o. daily in April 2023.  He patient underwent surgical resection of the remaining gastrointestinal stromal tumor under the care of Dr. Michaell Cowing on December 14, 2021 with very minimal residual disease.    In November 2023, his restaging CT scan for his lung cancer showed increased soft tissue density in the left hilar region concerning for possible developing lymphadenopathy or disease recurrence.  A PET scan was performed and showed new hypermetabolic left axillary, mediastinal, and left hilar adenopathy compatible with disease recurrence.   He underwent excisional biopsy of left axillary lymph node which was not conclusive of malignancy.   Therefore Dr. Tonia Brooms performed a repeat video bronchoscopy with EBUS on 04/11/2022 and the biopsy from the station 7 lymph node showed recurrent adenocarcinoma.  Therefore the patient is currently undergoing concurrent chemoradiation with carboplatin for an AUC of 2 and paclitaxel 45 mg/m. His first dose was on 05/14/2022.   Repeat PET scan showed no concerning findings for disease progression except for suspicious slowly growing adenocarcinoma in the right lower lobe but inflammatory changes in the right lung as well as suspicious reactive mediastinal lymphadenopathy.  The patient had repeat bronchoscopy by Dr. Tonia Brooms and the final pathology was consistent with recurrent non-small cell lung cancer. The patient started  systemic chemotherapy with carboplatin for AUC of 5 and Alimta 500 Mg/M2 on 12/25/2022.  Status post 3 cycles.  The patient has been tolerating his treatment fairly well.  The patient was seen with Dr. Arbutus Ped today.  Dr. Arbutus Ped personally and independently reviewed the scan and discussed results with the patient today.  The scan showed no evidence of disease progression.  Dr. Arbutus Ped recommends he continue on the same treatment.  Dr. Arbutus Ped clarified with the patient that he would receive 4-6 cycles of carboplatin and Alimta.  This to be followed by maintenance Alimta IV every 3 weeks unless there is evidence of disease progression or unacceptable toxicity.  As of now, Dr. Arbutus Ped is leaning towards having 6 cycles of carboplatin and Alimta followed by maintenance treatment.   We will see him back for follow-up visit in 3 weeks for evaluation repeat blood work before undergoing cycle #5.  He will continue to follow with GI and has an upcoming appointment on 03/21/2023  The patient was advised to call immediately if he has any concerning symptoms in the interval. The patient voices understanding of current disease status and treatment options and is in agreement with the current care plan. All questions were answered. The patient knows to call the clinic with any problems, questions or concerns. We can certainly see the patient much sooner if necessary   Orders Placed This Encounter  Procedures   CBC with Differential (Cancer Center Only)    Standing Status:   Future    Standing Expiration Date:   03/25/2024   CMP (Cancer Center only)    Standing Status:   Future    Standing Expiration Date:   03/25/2024   CBC with Differential (Cancer Center Only)    Standing Status:   Future    Standing Expiration Date:   04/15/2024   CMP (Cancer Center only)    Standing Status:   Future    Standing Expiration Date:   04/15/2024     Amabel Stmarie L Harrel Ferrone,  PA-C  03/05/23  Addendum: Hematology/Oncology Attending: I had a face-to-face encounter with the patient today.  I reviewed his record, lab, scan and recommended his care plan.  This is a very pleasant 72 years old white male with recurrent non-small cell lung cancer, adenocarcinoma that was initially diagnosed as stage IIb in June 2021 with evidence for disease recurrence and January and then September 2024.  The patient is currently undergoing systemic chemotherapy with carboplatin for AUC of 5 and Alimta 500 Mg/M2 status post 3 cycles.  He has been tolerating this treatment fairly well in the last cycle he has done very well.  He had repeat CT scan of the chest, abdomen and pelvis performed recently.  I personally and independently reviewed the scan and discussed the result with the patient and his wife. I recommended for him to proceed with cycle #4 today as planned.  We will complete 6 cycles of the combined carboplatin and Alimta and then after cycle #6 he will be on maintenance treatment with single agent Alimta every 3 weeks as long as the patient has no evidence for disease progression, unacceptable toxicity or refusal of the treatment. The patient and his wife are in agreement with the current plan. He will come back for follow-up visit in 3 weeks for evaluation before the next cycle of his treatment. The patient was advised to call immediately if he has any other concerning symptoms in the interval. The total time spent in the appointment was 30 minutes. Disclaimer: This note was dictated with voice recognition software. Similar sounding words can inadvertently be transcribed and may be missed upon review. Lajuana Matte, MD

## 2023-03-04 ENCOUNTER — Encounter: Payer: Self-pay | Admitting: Internal Medicine

## 2023-03-04 MED FILL — Fosaprepitant Dimeglumine For IV Infusion 150 MG (Base Eq): INTRAVENOUS | Qty: 5 | Status: AC

## 2023-03-05 ENCOUNTER — Inpatient Hospital Stay: Payer: Medicare Other

## 2023-03-05 ENCOUNTER — Inpatient Hospital Stay: Payer: Medicare Other | Admitting: Physician Assistant

## 2023-03-05 VITALS — BP 128/85 | HR 104 | Temp 98.2°F | Resp 17 | Wt 136.0 lb

## 2023-03-05 VITALS — HR 94

## 2023-03-05 DIAGNOSIS — C3431 Malignant neoplasm of lower lobe, right bronchus or lung: Secondary | ICD-10-CM | POA: Diagnosis not present

## 2023-03-05 DIAGNOSIS — C771 Secondary and unspecified malignant neoplasm of intrathoracic lymph nodes: Secondary | ICD-10-CM | POA: Diagnosis not present

## 2023-03-05 DIAGNOSIS — Z5111 Encounter for antineoplastic chemotherapy: Secondary | ICD-10-CM | POA: Diagnosis not present

## 2023-03-05 DIAGNOSIS — Z87891 Personal history of nicotine dependence: Secondary | ICD-10-CM | POA: Diagnosis not present

## 2023-03-05 DIAGNOSIS — C3490 Malignant neoplasm of unspecified part of unspecified bronchus or lung: Secondary | ICD-10-CM | POA: Diagnosis not present

## 2023-03-05 DIAGNOSIS — C3412 Malignant neoplasm of upper lobe, left bronchus or lung: Secondary | ICD-10-CM | POA: Diagnosis not present

## 2023-03-05 DIAGNOSIS — C3492 Malignant neoplasm of unspecified part of left bronchus or lung: Secondary | ICD-10-CM | POA: Diagnosis not present

## 2023-03-05 LAB — CMP (CANCER CENTER ONLY)
ALT: 35 U/L (ref 0–44)
AST: 34 U/L (ref 15–41)
Albumin: 3.9 g/dL (ref 3.5–5.0)
Alkaline Phosphatase: 108 U/L (ref 38–126)
Anion gap: 5 (ref 5–15)
BUN: 31 mg/dL — ABNORMAL HIGH (ref 8–23)
CO2: 30 mmol/L (ref 22–32)
Calcium: 9.8 mg/dL (ref 8.9–10.3)
Chloride: 104 mmol/L (ref 98–111)
Creatinine: 0.76 mg/dL (ref 0.61–1.24)
GFR, Estimated: >60 mL/min (ref >60)
Glucose, Bld: 95 mg/dL (ref 70–99)
Potassium: 3.8 mmol/L (ref 3.5–5.1)
Sodium: 139 mmol/L (ref 135–145)
Total Bilirubin: 0.3 mg/dL (ref <1.2)
Total Protein: 7.2 g/dL (ref 6.5–8.1)

## 2023-03-05 LAB — CBC WITH DIFFERENTIAL (CANCER CENTER ONLY)
Abs Immature Granulocytes: 0.02 10*3/uL (ref 0.00–0.07)
Basophils Absolute: 0 10*3/uL (ref 0.0–0.1)
Basophils Relative: 1 %
Eosinophils Absolute: 0 10*3/uL (ref 0.0–0.5)
Eosinophils Relative: 1 %
HCT: 31 % — ABNORMAL LOW (ref 39.0–52.0)
Hemoglobin: 10.1 g/dL — ABNORMAL LOW (ref 13.0–17.0)
Immature Granulocytes: 1 %
Lymphocytes Relative: 20 %
Lymphs Abs: 0.7 10*3/uL (ref 0.7–4.0)
MCH: 30.1 pg (ref 26.0–34.0)
MCHC: 32.6 g/dL (ref 30.0–36.0)
MCV: 92.5 fL (ref 80.0–100.0)
Monocytes Absolute: 0.4 10*3/uL (ref 0.1–1.0)
Monocytes Relative: 11 %
Neutro Abs: 2.4 10*3/uL (ref 1.7–7.7)
Neutrophils Relative %: 66 %
Platelet Count: 238 10*3/uL (ref 150–400)
RBC: 3.35 MIL/uL — ABNORMAL LOW (ref 4.22–5.81)
RDW: 19 % — ABNORMAL HIGH (ref 11.5–15.5)
WBC Count: 3.6 10*3/uL — ABNORMAL LOW (ref 4.0–10.5)
nRBC: 0 % (ref 0.0–0.2)

## 2023-03-05 MED ORDER — PALONOSETRON HCL INJECTION 0.25 MG/5ML
0.2500 mg | Freq: Once | INTRAVENOUS | Status: AC
  Administered 2023-03-05: 0.25 mg via INTRAVENOUS
  Filled 2023-03-05: qty 5

## 2023-03-05 MED ORDER — SODIUM CHLORIDE 0.9 % IV SOLN
150.0000 mg | Freq: Once | INTRAVENOUS | Status: AC
  Administered 2023-03-05: 150 mg via INTRAVENOUS
  Filled 2023-03-05: qty 150

## 2023-03-05 MED ORDER — DEXAMETHASONE SODIUM PHOSPHATE 10 MG/ML IJ SOLN
10.0000 mg | Freq: Once | INTRAMUSCULAR | Status: AC
  Administered 2023-03-05: 10 mg via INTRAVENOUS
  Filled 2023-03-05: qty 1

## 2023-03-05 MED ORDER — SODIUM CHLORIDE 0.9 % IV SOLN
500.0000 mg/m2 | Freq: Once | INTRAVENOUS | Status: AC
  Administered 2023-03-05: 900 mg via INTRAVENOUS
  Filled 2023-03-05: qty 20

## 2023-03-05 MED ORDER — SODIUM CHLORIDE 0.9 % IV SOLN: Freq: Once | INTRAVENOUS | Status: AC

## 2023-03-05 MED ORDER — SODIUM CHLORIDE 0.9 % IV SOLN
422.5000 mg | Freq: Once | INTRAVENOUS | Status: AC
  Administered 2023-03-05: 420 mg via INTRAVENOUS
  Filled 2023-03-05: qty 42

## 2023-03-05 NOTE — Patient Instructions (Signed)
[facility] - A DEPT OF [facility]  Discharge Instructions: Thank you for choosing [facility] to provide your oncology and hematology care.   If you have a lab appointment with the Cancer Center, please go directly to the Cancer Center and check in at the registration area.   Wear comfortable clothing and clothing appropriate for easy access to any Portacath or PICC line.   We strive to give you quality time with your provider. You may need to reschedule your appointment if you arrive late (15 or more minutes).  Arriving late affects you and other patients whose appointments are after yours.  Also, if you miss three or more appointments without notifying the office, you may be dismissed from the clinic at the provider's discretion.      For prescription refill requests, have your pharmacy contact our office and allow 72 hours for refills to be completed.    Today you received the following chemotherapy and/or immunotherapy agents: Alimta/Carboplatin      To help prevent nausea and vomiting after your treatment, we encourage you to take your nausea medication as directed.  BELOW ARE SYMPTOMS THAT SHOULD BE REPORTED IMMEDIATELY: *FEVER GREATER THAN 100.4 F (38 C) OR HIGHER *CHILLS OR SWEATING *NAUSEA AND VOMITING THAT IS NOT CONTROLLED WITH YOUR NAUSEA MEDICATION *UNUSUAL SHORTNESS OF BREATH *UNUSUAL BRUISING OR BLEEDING *URINARY PROBLEMS (pain or burning when urinating, or frequent urination) *BOWEL PROBLEMS (unusual diarrhea, constipation, pain near the anus) TENDERNESS IN MOUTH AND THROAT WITH OR WITHOUT PRESENCE OF ULCERS (sore throat, sores in mouth, or a toothache) UNUSUAL RASH, SWELLING OR PAIN  UNUSUAL VAGINAL DISCHARGE OR ITCHING   Items with * indicate a potential emergency and should be followed up as soon as possible or go to the Emergency Department if any problems should occur.  Please show the CHEMOTHERAPY ALERT CARD or  IMMUNOTHERAPY ALERT CARD at check-in to the Emergency Department and triage nurse.  Should you have questions after your visit or need to cancel or reschedule your appointment, please contact [facility] - A DEPT OF [facility]  Dept: [phone]  and follow the prompts.  Office hours are 8:00 a.m. to 4:30 p.m. Monday - Friday. Please note that voicemails left after 4:00 p.m. may not be returned until the following business day.  We are closed weekends and major holidays. You have access to a nurse at all times for urgent questions. Please call the main number to the clinic Dept: [phone] and follow the prompts.   For any non-urgent questions, you may also contact your provider using MyChart. We now offer e-Visits for anyone 61 and older to request care online for non-urgent symptoms. For details visit [email or web address].   Also download the MyChart app! Go to the app store, search "MyChart", open the app, select [place], and log in with your MyChart username and password.

## 2023-03-12 ENCOUNTER — Inpatient Hospital Stay: Payer: Medicare Other

## 2023-03-12 DIAGNOSIS — C771 Secondary and unspecified malignant neoplasm of intrathoracic lymph nodes: Secondary | ICD-10-CM | POA: Diagnosis not present

## 2023-03-12 DIAGNOSIS — C3431 Malignant neoplasm of lower lobe, right bronchus or lung: Secondary | ICD-10-CM

## 2023-03-12 DIAGNOSIS — Z5111 Encounter for antineoplastic chemotherapy: Secondary | ICD-10-CM | POA: Diagnosis not present

## 2023-03-12 DIAGNOSIS — Z87891 Personal history of nicotine dependence: Secondary | ICD-10-CM | POA: Diagnosis not present

## 2023-03-12 DIAGNOSIS — C3412 Malignant neoplasm of upper lobe, left bronchus or lung: Secondary | ICD-10-CM | POA: Diagnosis not present

## 2023-03-12 LAB — CMP (CANCER CENTER ONLY)
ALT: 39 U/L (ref 0–44)
AST: 35 U/L (ref 15–41)
Albumin: 3.8 g/dL (ref 3.5–5.0)
Alkaline Phosphatase: 112 U/L (ref 38–126)
Anion gap: 5 (ref 5–15)
BUN: 21 mg/dL (ref 8–23)
CO2: 31 mmol/L (ref 22–32)
Calcium: 9.7 mg/dL (ref 8.9–10.3)
Chloride: 101 mmol/L (ref 98–111)
Creatinine: 0.61 mg/dL (ref 0.61–1.24)
GFR, Estimated: >60 mL/min (ref >60)
Glucose, Bld: 99 mg/dL (ref 70–99)
Potassium: 4.1 mmol/L (ref 3.5–5.1)
Sodium: 137 mmol/L (ref 135–145)
Total Bilirubin: 0.5 mg/dL (ref <1.2)
Total Protein: 7.2 g/dL (ref 6.5–8.1)

## 2023-03-12 LAB — CBC WITH DIFFERENTIAL (CANCER CENTER ONLY)
Abs Immature Granulocytes: 0.01 10*3/uL (ref 0.00–0.07)
Basophils Absolute: 0 10*3/uL (ref 0.0–0.1)
Basophils Relative: 1 %
Eosinophils Absolute: 0 10*3/uL (ref 0.0–0.5)
Eosinophils Relative: 1 %
HCT: 30.8 % — ABNORMAL LOW (ref 39.0–52.0)
Hemoglobin: 10 g/dL — ABNORMAL LOW (ref 13.0–17.0)
Immature Granulocytes: 1 %
Lymphocytes Relative: 26 %
Lymphs Abs: 0.5 10*3/uL — ABNORMAL LOW (ref 0.7–4.0)
MCH: 29.9 pg (ref 26.0–34.0)
MCHC: 32.5 g/dL (ref 30.0–36.0)
MCV: 92.2 fL (ref 80.0–100.0)
Monocytes Absolute: 0.1 10*3/uL (ref 0.1–1.0)
Monocytes Relative: 7 %
Neutro Abs: 1.1 10*3/uL — ABNORMAL LOW (ref 1.7–7.7)
Neutrophils Relative %: 64 %
Platelet Count: 131 10*3/uL — ABNORMAL LOW (ref 150–400)
RBC: 3.34 MIL/uL — ABNORMAL LOW (ref 4.22–5.81)
RDW: 17.2 % — ABNORMAL HIGH (ref 11.5–15.5)
WBC Count: 1.8 10*3/uL — ABNORMAL LOW (ref 4.0–10.5)
nRBC: 0 % (ref 0.0–0.2)

## 2023-03-19 ENCOUNTER — Inpatient Hospital Stay: Payer: Medicare Other | Attending: Internal Medicine

## 2023-03-19 DIAGNOSIS — G629 Polyneuropathy, unspecified: Secondary | ICD-10-CM | POA: Diagnosis not present

## 2023-03-19 DIAGNOSIS — Z923 Personal history of irradiation: Secondary | ICD-10-CM | POA: Insufficient documentation

## 2023-03-19 DIAGNOSIS — C771 Secondary and unspecified malignant neoplasm of intrathoracic lymph nodes: Secondary | ICD-10-CM | POA: Diagnosis not present

## 2023-03-19 DIAGNOSIS — C3431 Malignant neoplasm of lower lobe, right bronchus or lung: Secondary | ICD-10-CM

## 2023-03-19 DIAGNOSIS — Z5111 Encounter for antineoplastic chemotherapy: Secondary | ICD-10-CM | POA: Insufficient documentation

## 2023-03-19 DIAGNOSIS — Z902 Acquired absence of lung [part of]: Secondary | ICD-10-CM | POA: Insufficient documentation

## 2023-03-19 DIAGNOSIS — Z9221 Personal history of antineoplastic chemotherapy: Secondary | ICD-10-CM | POA: Insufficient documentation

## 2023-03-19 DIAGNOSIS — C3412 Malignant neoplasm of upper lobe, left bronchus or lung: Secondary | ICD-10-CM | POA: Diagnosis not present

## 2023-03-19 DIAGNOSIS — C49A5 Gastrointestinal stromal tumor of rectum: Secondary | ICD-10-CM | POA: Diagnosis not present

## 2023-03-19 LAB — CBC WITH DIFFERENTIAL (CANCER CENTER ONLY)
Abs Immature Granulocytes: 0.01 10*3/uL (ref 0.00–0.07)
Basophils Absolute: 0 10*3/uL (ref 0.0–0.1)
Basophils Relative: 0 %
Eosinophils Absolute: 0 10*3/uL (ref 0.0–0.5)
Eosinophils Relative: 1 %
HCT: 30 % — ABNORMAL LOW (ref 39.0–52.0)
Hemoglobin: 10 g/dL — ABNORMAL LOW (ref 13.0–17.0)
Immature Granulocytes: 0 %
Lymphocytes Relative: 19 %
Lymphs Abs: 0.6 10*3/uL — ABNORMAL LOW (ref 0.7–4.0)
MCH: 31.1 pg (ref 26.0–34.0)
MCHC: 33.3 g/dL (ref 30.0–36.0)
MCV: 93.2 fL (ref 80.0–100.0)
Monocytes Absolute: 0.3 10*3/uL (ref 0.1–1.0)
Monocytes Relative: 11 %
Neutro Abs: 2.2 10*3/uL (ref 1.7–7.7)
Neutrophils Relative %: 69 %
Platelet Count: 110 10*3/uL — ABNORMAL LOW (ref 150–400)
RBC: 3.22 MIL/uL — ABNORMAL LOW (ref 4.22–5.81)
RDW: 17 % — ABNORMAL HIGH (ref 11.5–15.5)
WBC Count: 3.2 10*3/uL — ABNORMAL LOW (ref 4.0–10.5)
nRBC: 0 % (ref 0.0–0.2)

## 2023-03-19 LAB — CMP (CANCER CENTER ONLY)
ALT: 25 U/L (ref 0–44)
AST: 22 U/L (ref 15–41)
Albumin: 4.1 g/dL (ref 3.5–5.0)
Alkaline Phosphatase: 122 U/L (ref 38–126)
Anion gap: 6 (ref 5–15)
BUN: 24 mg/dL — ABNORMAL HIGH (ref 8–23)
CO2: 31 mmol/L (ref 22–32)
Calcium: 9.9 mg/dL (ref 8.9–10.3)
Chloride: 102 mmol/L (ref 98–111)
Creatinine: 0.65 mg/dL (ref 0.61–1.24)
GFR, Estimated: >60 mL/min (ref >60)
Glucose, Bld: 100 mg/dL — ABNORMAL HIGH (ref 70–99)
Potassium: 4.1 mmol/L (ref 3.5–5.1)
Sodium: 139 mmol/L (ref 135–145)
Total Bilirubin: 0.4 mg/dL (ref <1.2)
Total Protein: 7.5 g/dL (ref 6.5–8.1)

## 2023-03-21 ENCOUNTER — Encounter: Payer: Self-pay | Admitting: Internal Medicine

## 2023-03-21 ENCOUNTER — Ambulatory Visit: Payer: Medicare Other | Admitting: Internal Medicine

## 2023-03-21 VITALS — BP 104/70 | HR 83 | Ht 69.0 in | Wt 137.0 lb

## 2023-03-21 DIAGNOSIS — C799 Secondary malignant neoplasm of unspecified site: Secondary | ICD-10-CM

## 2023-03-21 DIAGNOSIS — C49A5 Gastrointestinal stromal tumor of rectum: Secondary | ICD-10-CM | POA: Diagnosis not present

## 2023-03-21 DIAGNOSIS — Z931 Gastrostomy status: Secondary | ICD-10-CM | POA: Diagnosis not present

## 2023-03-21 DIAGNOSIS — K529 Noninfective gastroenteritis and colitis, unspecified: Secondary | ICD-10-CM | POA: Diagnosis not present

## 2023-03-21 DIAGNOSIS — C3492 Malignant neoplasm of unspecified part of left bronchus or lung: Secondary | ICD-10-CM | POA: Diagnosis not present

## 2023-03-21 NOTE — Progress Notes (Signed)
Jesse [name] 72 y.o. [birth year] [id number]  Assessment & Plan:   Encounter Diagnoses  Name Primary?   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  He will continue with his current regimen.  I have recommended he try to reduce the amount of the balance of nature supplement with the most fiber in it to see if that further solidifies or firms up stools.  He will follow-up as needed at this point.  We are leaving the gastrostomy tube in place in case the need arises in the future.  He is continuing to flush it and it looks to be in good shape.  I did discuss that if it were to come out it would need to be replaced quickly to prevent closure.  That often requires an ER visit.  If it does fall out and he has not used it in some time we might just leave it.  I have just been opposed to removing it in the setting and he is comfortable living with it.    Subjective:   Chief Complaint: Follow-up of diarrhea  HPI 72 year old white man with a history of a rectal GIST resected by Dr. Michaell Cowing with a transient diverting ileostomy and then reanastomosis earlier this year. 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 Shirline Frees    CURRENT LUNG CANCER THERAPY: Systemic chemotherapy with carboplatin for AUC of 5 and Alimta 500 Mg/M2 every 3 weeks.  First dose 12/25/2022.     09/19/2022 office visit with Dr. Leone Payor for chronic diarrhea.  At that time discussed that he had issues with diarrhea and fecal incontinence while on tube nutrition status post resection of a rectal GIST.  Diarrhea thought multifactorial.  Has been tried on Lomotil/Imodium/cholestyramine.  Discussed possible colonoscopy to determine if  there are other issues in the future.  Also thought possibly the Gleevec was contributing.  At that visit Mirtazapine increased from 15 to 30 mg.  Also underwent pelvic floor therapy.    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  The biopsies were essentially normal.  It was thought that diarrhea could be worsened by Gleevec.  Discussed the use of AI scribe software for clinical note transcription with the patient, who gave verbal consent to proceed.  History of Present Illness   Jesse [name], a patient with a history of recurrent lung cancer and post-operative changes in the rectum, presents for a follow-up regarding his bowel issues. The patient reports a frequency of bowel movements, approximately 10-12 times a day, with small amounts each time. The consistency of the stool is not loose, and the frequency of bowel movements has improved, making the condition more tolerable.  The patient is currently undergoing chemotherapy with two more sessions remaining. He also uses a juice supplement and two other supplements, Enterraid for IBS-D and Balance of Nature Fiber and Spice Dietary Supplement, to manage his bowel issues. The patient and his caregiver have been experimenting with these supplements to find the right balance.  Previously, the patient was on Gleevec and cholestyramine, which were discontinued several months ago. The cessation of Gleevec and tube feeding coincided with a change in the  patient's bowel issues.   The patient's weight has remained stable, and he expresses a desire to gain weight. He reports that the use of tube nutrition made real food taste unpleasant, leading to a decrease in oral intake. The patient maintains the gastrostomy tube by flushing it daily.       Wt Readings from Last 3 Encounters:  03/21/23 137 lb (62.1 kg)  03/05/23 136 lb (61.7 kg)  02/12/23 136 lb 9.6 oz (62 kg)     No Known Allergies Current Meds  Medication Sig   acetaminophen (TYLENOL) 500 MG tablet Take 1,000 mg by mouth every 8 (eight) hours as needed for moderate pain.   aspirin 81 MG chewable tablet Chew 1 tablet (81 mg total) by mouth daily.   atorvastatin (LIPITOR) 20 MG tablet Take 1 tablet (20 mg total) by mouth daily.   Cholecalciferol (VITAMIN D3 PO) Take 2,000 Units by mouth daily.   diphenoxylate-atropine (LOMOTIL) 2.5-0.025 MG tablet Take 2 tablets by mouth 3 (three) times daily as needed for diarrhea or loose stools.   ferrous sulfate 325 (65 FE) MG EC tablet Take 1 tablet (325 mg total) by mouth 2 (two) times daily.   folic acid (FOLVITE) 1 MG tablet TAKE 1 TABLET BY MOUTH EVERY DAY   lidocaine (XYLOCAINE) 5 % ointment Apply 1 Application topically 3 (three) times daily as needed for mild pain or moderate pain.   loperamide (IMODIUM A-D) 2 MG tablet Take 4 mg by mouth 4 (four) times daily as needed for diarrhea or loose stools.   mirtazapine (REMERON SOL-TAB) 30 MG disintegrating tablet TAKE 1 TABLET BY MOUTH AT BEDTIME.   Multiple Vitamins-Minerals (PRESERVISION AREDS 2) CAPS Take 1 capsule by mouth 2 (two) times daily.   Nutritional Supplements (ENTERADE IBS-D PO) Take by mouth.   OVER THE COUNTER MEDICATION Take 1 tablet by mouth daily. PROBIOTICS   OVER THE COUNTER MEDICATION Fiber and spice pt take every morning   OVER THE COUNTER MEDICATION Juce pt takes every morning   pantoprazole (PROTONIX) 40 MG tablet Take 1 tablet (40 mg total) by mouth 2 (two) times daily.   prochlorperazine (COMPAZINE) 10 MG tablet Take 1 tablet (10 mg total) by mouth every 6 (six) hours as needed for nausea or vomiting.   Past Medical History:  Diagnosis Date   Anemia    Cancer (HCC)    bladder cancer   COPD (chronic obstructive pulmonary disease) (HCC)    mild    DIVERTICULOSIS, COLON 01/14/2007   Qualifier: Diagnosis of  By: Marcelyn Ditty RN, Katy Fitch    GASTRITIS, CHRONIC 06/01/2004   Qualifier:  Diagnosis of  By: Creta Levin CMA (AAMA), Robin     GERD 01/09/2007   Qualifier: Diagnosis of  By: Tawanna Cooler, RN, Ellen     Heart murmur    as a child; no murmur heard 12/14/19   History of radiation therapy    Mediastinum- 05/15/22-06/28/22-Dr. Antony Blackbird   HYPERLIPIDEMIA 01/09/2007   Qualifier: Diagnosis of  By: Everett Graff     Hypertension    Lung mass    left upper nodule   Macular degeneration    right eye   NEOPLASM, MALIGNANT, BLADDER, HX OF 2002   Qualifier: Diagnosis of  By: Creta Levin CMA (AAMA), Robin  / no chemo or radiation   OSTEOARTHRITIS 01/14/2007   Qualifier: Diagnosis of  By: Marcelyn Ditty, RN, Katy Fitch - knees   Rectal mass    Reflux esophagitis 06/01/2004   Qualifier: Diagnosis of  By:  Stallings CMA (AAMA), Robin     Stroke [facility])    TOBACCO USER 02/16/2009   Qualifier: Diagnosis of  By: Amador Cunas  MD, Janett Labella    VITAMIN D DEFICIENCY 06/03/2007   Qualifier: Diagnosis of  By: Amador Cunas  MD, Janett Labella    Past Surgical History:  Procedure Laterality Date   BIOPSY  06/18/2022   Procedure: BIOPSY;  Surgeon: Shellia Cleverly, DO;  [facility]: [facility];  Service: Gastroenterology;;   BREAST BIOPSY Left 03/16/2022   [place] LT RADIOACTIVE SEED LOC 03/16/2022 [facility]   BRONCHIAL BIOPSY  10/13/2019   Procedure: BRONCHIAL BIOPSIES;  Surgeon: Josephine Igo, DO;  [facility];  Service: Pulmonary;;   BRONCHIAL BIOPSY  12/04/2022   Procedure: BRONCHIAL BIOPSIES;  Surgeon: Josephine Igo, DO;  [facility];  Service: Pulmonary;;   BRONCHIAL BRUSHINGS  10/13/2019   Procedure: BRONCHIAL BRUSHINGS;  Surgeon: Josephine Igo, DO;  [facility];  Service: Pulmonary;;   BRONCHIAL NEEDLE ASPIRATION BIOPSY  10/13/2019   Procedure: BRONCHIAL NEEDLE ASPIRATION BIOPSIES;  Surgeon: Josephine Igo, DO;  [facility];  Service: Pulmonary;;   BRONCHIAL NEEDLE ASPIRATION BIOPSY  04/11/2022   Procedure: BRONCHIAL NEEDLE ASPIRATION BIOPSIES;   Surgeon: Josephine Igo, DO;  [facility];  Service: Cardiopulmonary;;   BRONCHIAL NEEDLE ASPIRATION BIOPSY  12/04/2022   Procedure: BRONCHIAL NEEDLE ASPIRATION BIOPSIES;  Surgeon: Josephine Igo, DO;  [facility];  Service: Pulmonary;;   BRONCHIAL WASHINGS  10/13/2019   Procedure: BRONCHIAL WASHINGS;  Surgeon: Josephine Igo, DO;  [facility];  Service: Pulmonary;;   BRONCHIAL WASHINGS  12/04/2022   Procedure: BRONCHIAL WASHINGS;  Surgeon: Josephine Igo, DO;  [facility];  Service: Pulmonary;;   COLONOSCOPY     multiple   DIVERTING ILEOSTOMY N/A 12/14/2021   Procedure: DIVERTING ILEOSTOMY;  Surgeon: Karie Soda, MD;  [facility]: [facility];  Service: General;  Laterality: N/A;   ENDOBRONCHIAL ULTRASOUND Bilateral 04/11/2022   Procedure: ENDOBRONCHIAL ULTRASOUND;  Surgeon: Josephine Igo, DO;  [facility];  Service: Cardiopulmonary;  Laterality: Bilateral;   ESOPHAGEAL DILATION  06/18/2022   Procedure: ESOPHAGEAL DILATION;  Surgeon: Shellia Cleverly, DO;  [facility];  Service: Gastroenterology;;   ESOPHAGOGASTRODUODENOSCOPY (EGD) WITH PROPOFOL N/A 06/18/2022   Procedure: ESOPHAGOGASTRODUODENOSCOPY (EGD) WITH PROPOFOL;  Surgeon: Shellia Cleverly, DO;  [facility];  Service: Gastroenterology;  Laterality: N/A;  Severe dysphagia/odynophagia ?radiation esophagitis versus oral candidiasis   FIDUCIAL MARKER PLACEMENT  12/04/2022   Procedure: FIDUCIAL MARKER PLACEMENT;  Surgeon: Josephine Igo, DO;  [facility];  Service: Pulmonary;;   ILEOSTOMY CLOSURE N/A 03/22/2022   Procedure: OPEN TAKEDOWN OF LOOP ILEOSTOMY;  Surgeon: Karie Soda, MD;  [facility];  Service: General;  Laterality: N/A;  GEN w/ERAS PATHWAY LOCAL   INTERCOSTAL NERVE BLOCK Left 12/16/2019   Procedure: INTERCOSTAL NERVE BLOCK;  Surgeon: Corliss Skains, MD;  [facility];  Service: Thoracic;  Laterality: Left;   IR FLUORO RM  30-60 MIN  07/02/2022   KNEE SURGERY  07/20/2008   bilat.   NASAL SEPTUM SURGERY  1995   NODE DISSECTION Left 12/16/2019   Procedure: NODE DISSECTION;  Surgeon: Corliss Skains, MD;  [facility];  Service: Thoracic;  Laterality: Left;   PEG PLACEMENT N/A 07/04/2022   Procedure: PERCUTANEOUS ENDOSCOPIC GASTROSTOMY (PEG) PLACEMENT;  Surgeon: Iva Boop, MD;  [facility];  Service: Gastroenterology;  Laterality: N/A;   RADIOACTIVE SEED  GUIDED AXILLARY SENTINEL LYMPH NODE Left 03/20/2022   Procedure: RADIOACTIVE SEED GUIDED LEFT AXILLARY SENTINEL LYMPH NODE BIOPSY;  Surgeon: Harriette Bouillon, MD;  [facility];  Service: General;  Laterality: Left;   RECTAL EXAM UNDER ANESTHESIA N/A 03/22/2022   Procedure: ANORECTAL EXAMINATION UNDER ANESTHESIA;  Surgeon: Karie Soda, MD;  [facility];  Service: General;  Laterality: N/A;   TONSILLECTOMY     TRANSURETHRAL RESECTION OF BLADDER TUMOR  2002   UPPER GASTROINTESTINAL ENDOSCOPY     VIDEO BRONCHOSCOPY  04/11/2022   Procedure: VIDEO BRONCHOSCOPY;  Surgeon: Josephine Igo, DO;  [facility];  Service: Cardiopulmonary;;   VIDEO BRONCHOSCOPY WITH ENDOBRONCHIAL NAVIGATION N/A 10/13/2019   Procedure: VIDEO BRONCHOSCOPY WITH ENDOBRONCHIAL NAVIGATION;  Surgeon: Josephine Igo, DO;  [facility];  Service: Pulmonary;  Laterality: N/A;   WISDOM TOOTH EXTRACTION     XI ROBOTIC ASSISTED LOWER ANTERIOR RESECTION N/A 12/14/2021   Procedure: XI ROBOTIC ASSISTED LOWER ANTERIOR ULTRA LOW RECTOSIGMOID RESECTION, COLOANAL HAND SEWN ANASTOMOSIS, AND BILATERAL TAP BLOCK;  Surgeon: Karie Soda, MD;  [facility];  Service: General;  Laterality: N/A;   Social History   Social History Narrative   Married   3 children: 3 grandchildren; 3 great grandchildren   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   Review of Systems As above  Objective:   Physical Exam BP 104/70   Pulse 83   Ht 5\' 9"  (1.753 m)   Wt 137 lb (62.1 kg)   BMI 20.23 kg/m   Physical Exam   Abdomen: Gastrostomy tube present in left upper quadrant, site clean, mild mucoid exudate appropriate, no erythema, no tenderness, tube in reasonable condition.     33 minutes total time spent on this visit.

## 2023-03-21 NOTE — Patient Instructions (Addendum)
Please continue with your current chemotherapy regimen and daily flushing of your gastrostomy tube. Consider reducing your fiber supplement intake to potentially decrease the frequency of your bowel movements. Follow up with your oncologist to discuss the possibility of travel after your chemotherapy is completed.  _______________________________________________________  If your blood pressure at your visit was 140/90 or greater, please contact your primary care physician to follow up on this.  _______________________________________________________  If you are age 54 or older, your body mass index should be between 23-30. Your Body mass index is 20.23 kg/m. If this is out of the aforementioned range listed, please consider follow up with your Primary Care Provider.  If you are age 23 or younger, your body mass index should be between 19-25. Your Body mass index is 20.23 kg/m. If this is out of the aformentioned range listed, please consider follow up with your Primary Care Provider.   ________________________________________________________  The [facility] providers would like to encourage you to use [facility] to communicate with providers for non-urgent requests or questions.  Due to long hold times on the telephone, sending your provider a message by [facility] may be a faster and more efficient way to get a response.  Please allow 48 business hours for a response.  Please remember that this is for non-urgent requests.  _______________________________________________________  I appreciate the opportunity to care for you. Stan Head, MD, [facility]

## 2023-03-24 NOTE — Progress Notes (Unsigned)
Patient Care Team: [name], MD as PCP - General (Family Medicine) Si Gaul, MD as Consulting Physician (Oncology) Verner Chol, [facility] (Inactive) as Pharmacist (Pharmacist) Iva Boop, MD as Consulting Physician (Gastroenterology) Josephine Igo, DO as Consulting Physician (Pulmonary Disease) Berneice Heinrich Delbert Phenix., MD as Consulting Physician (Urology) Karie Soda, MD as Consulting Physician (General Surgery) Pricilla Riffle, MD as Consulting Physician (Cardiology)   CHIEF COMPLAINT: Follow up recurrent lung cancer  1) recurrent non-small cell lung cancer, adenocarcinoma with positive mediastinal lymph node in December 2023 that was initially diagnosed as stage IIB (T1 a, N1, M0) non-small cell lung cancer, adenocarcinoma diagnosed in June 2021.  The patient also has evidence for recurrent disease in the mediastinal lymph node in January 2024 and in the right lower lobe in August 2024. 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   Biomarker Findings Tumor Mutational Burden - 20 Muts/Mb Microsatellite status - MS-Stable Genomic Findings For a complete list of the genes assayed, please refer to the Appendix. KEAP1 I45fs*17 DNMT3A E817* RB1 splice site 1960+5G>C TP53 R209* 8 Disease relevant genes with no reportable alterations: ALK, BRAF, EGFR, ERBB2, KRAS, MET, RET, ROS1   PDL1 Expression: 1 %   PRIOR THERAPY: 1) status post left upper lobectomy with lymph node dissection on December 16, 2019 under the care of Dr. Cliffton Asters.  The tumor size measured 0.9 cm but there was involvement of the level 10 L and 11 L.  2) Adjuvant systemic chemotherapy with cisplatin 75 mg/M2 and Alimta 500 mg/M2 every 3 weeks.  First dose February 04, 2020.  Status post 4 cycles. 3) Neoadjuvant treatment with imatinib 400 mg p.o. daily for the GIST tumor of the pelvic area.  First dose started July 18, 2020.  Status post 12 months of treatment. 4) status post robotic assisted  very low anterior rectosigmoid resection with coloanal anastomosis, diverting loop ileostomy and wedge liver biopsy under the care of Dr. Michaell Cowing on December 14, 2021 and it showed minimal residual gastrointestinal stromal tumor. 5) concurrent chemoradiation with weekly carboplatin for AUC of 2 and paclitaxel 45 Mg/M2.  First dose May 14, 2022. 6) Imatinib 400 mg p.o. daily   CURRENT THERAPY: Systemic chemotherapy with carboplatin for AUC of 5 and Alimta 500 Mg/M2 every 3 weeks.  First dose 12/25/2022.     INTERVAL HISTORY Mr. Grill returns for follow up and treatment as scheduled. Lat seen by Dr. Arbutus Ped 03/05/23 with C4 Carbo/Alimta. He tolerates treatment well except some fatigue for a day or 2 after.  He remains out of bed and active.  Eating and drinking well, trying to maintain weight.  He still has considerable GI output which is being followed by Dr. Leone Payor, improving.  This was upwards of 70 times a day at the worst, but has not been that way in a while now.  Better off Gleevec.  Imodium and Lomotil are helpful.  Denies nausea/vomiting.  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  Denies fever, chills, cough, chest pain, dyspnea, pain, or any other new or specific complaints.  ROS  All other systems reviewed and negative  Past Medical History:  Diagnosis Date   Anemia    Cancer (HCC)    bladder cancer   COPD (chronic obstructive pulmonary disease) (HCC)    mild    DIVERTICULOSIS, COLON 01/14/2007   Qualifier: Diagnosis of  By: Marcelyn Ditty RN, Katy Fitch    GASTRITIS, CHRONIC 06/01/2004  Qualifier: Diagnosis of  By: Creta Levin CMA (AAMA), Robin     GERD 01/09/2007   Qualifier: Diagnosis of  By: Tawanna Cooler, RN, Ellen     Heart murmur    as a child; no murmur heard 12/14/19   History of radiation therapy    Mediastinum- 05/15/22-06/28/22-Dr. Antony Blackbird   HYPERLIPIDEMIA 01/09/2007   Qualifier: Diagnosis of  By: Tawanna Cooler RN, Alvino Chapel     Hypertension    Lung mass    left  upper nodule   Macular degeneration    right eye   NEOPLASM, MALIGNANT, BLADDER, HX OF 2002   Qualifier: Diagnosis of  By: Creta Levin CMA (AAMA), Robin  / no chemo or radiation   OSTEOARTHRITIS 01/14/2007   Qualifier: Diagnosis of  By: Marcelyn Ditty, RN, Katy Fitch - knees   Rectal mass    Reflux esophagitis 06/01/2004   Qualifier: Diagnosis of  By: Creta Levin CMA (AAMA), Robin     Stroke [facility])    TOBACCO USER 02/16/2009   Qualifier: Diagnosis of  By: Amador Cunas  MD, Janett Labella    VITAMIN D DEFICIENCY 06/03/2007   Qualifier: Diagnosis of  By: Amador Cunas  MD, Janett Labella      Past Surgical History:  Procedure Laterality Date   BIOPSY  06/18/2022   Procedure: BIOPSY;  Surgeon: Shellia Cleverly, DO;  [facility]: [facility];  Service: Gastroenterology;;   BREAST BIOPSY Left 03/16/2022   [place] LT RADIOACTIVE SEED LOC 03/16/2022 [facility]   BRONCHIAL BIOPSY  10/13/2019   Procedure: BRONCHIAL BIOPSIES;  Surgeon: Josephine Igo, DO;  [facility];  Service: Pulmonary;;   BRONCHIAL BIOPSY  12/04/2022   Procedure: BRONCHIAL BIOPSIES;  Surgeon: Josephine Igo, DO;  [facility];  Service: Pulmonary;;   BRONCHIAL BRUSHINGS  10/13/2019   Procedure: BRONCHIAL BRUSHINGS;  Surgeon: Josephine Igo, DO;  [facility];  Service: Pulmonary;;   BRONCHIAL NEEDLE ASPIRATION BIOPSY  10/13/2019   Procedure: BRONCHIAL NEEDLE ASPIRATION BIOPSIES;  Surgeon: Josephine Igo, DO;  [facility];  Service: Pulmonary;;   BRONCHIAL NEEDLE ASPIRATION BIOPSY  04/11/2022   Procedure: BRONCHIAL NEEDLE ASPIRATION BIOPSIES;  Surgeon: Josephine Igo, DO;  [facility];  Service: Cardiopulmonary;;   BRONCHIAL NEEDLE ASPIRATION BIOPSY  12/04/2022   Procedure: BRONCHIAL NEEDLE ASPIRATION BIOPSIES;  Surgeon: Josephine Igo, DO;  [facility];  Service: Pulmonary;;   BRONCHIAL WASHINGS  10/13/2019   Procedure: BRONCHIAL WASHINGS;  Surgeon: Josephine Igo, DO;  [facility];  Service: Pulmonary;;   BRONCHIAL WASHINGS  12/04/2022   Procedure: BRONCHIAL WASHINGS;  Surgeon: Josephine Igo, DO;  [facility];  Service: Pulmonary;;   COLONOSCOPY     multiple   DIVERTING ILEOSTOMY N/A 12/14/2021   Procedure: DIVERTING ILEOSTOMY;  Surgeon: Karie Soda, MD;  [facility];  Service: General;  Laterality: N/A;   ENDOBRONCHIAL ULTRASOUND Bilateral 04/11/2022   Procedure: ENDOBRONCHIAL ULTRASOUND;  Surgeon: Josephine Igo, DO;  [facility];  Service: Cardiopulmonary;  Laterality: Bilateral;   ESOPHAGEAL DILATION  06/18/2022   Procedure: ESOPHAGEAL DILATION;  Surgeon: Shellia Cleverly, DO;  [facility];  Service: Gastroenterology;;   ESOPHAGOGASTRODUODENOSCOPY (EGD) WITH PROPOFOL N/A 06/18/2022   Procedure: ESOPHAGOGASTRODUODENOSCOPY (EGD) WITH PROPOFOL;  Surgeon: Shellia Cleverly, DO;  [facility];  Service: Gastroenterology;  Laterality: N/A;  Severe dysphagia/odynophagia ?radiation esophagitis versus oral candidiasis   FIDUCIAL MARKER PLACEMENT  12/04/2022   Procedure: FIDUCIAL MARKER PLACEMENT;  Surgeon: Josephine Igo, DO;  [facility];  Service: Pulmonary;;   ILEOSTOMY CLOSURE N/A 03/22/2022   Procedure: OPEN TAKEDOWN OF LOOP ILEOSTOMY;  Surgeon: Karie Soda, MD;  [facility];  Service: General;  Laterality: N/A;  GEN w/ERAS PATHWAY LOCAL   INTERCOSTAL NERVE BLOCK Left 12/16/2019   Procedure: INTERCOSTAL NERVE BLOCK;  Surgeon: Corliss Skains, MD;  [facility];  Service: Thoracic;  Laterality: Left;   IR FLUORO RM 30-60 MIN  07/02/2022   KNEE SURGERY  07/20/2008   bilat.   NASAL SEPTUM SURGERY  1995   NODE DISSECTION Left 12/16/2019   Procedure: NODE DISSECTION;  Surgeon: Corliss Skains, MD;  [facility];  Service: Thoracic;  Laterality: Left;   PEG PLACEMENT N/A 07/04/2022   Procedure: PERCUTANEOUS ENDOSCOPIC GASTROSTOMY (PEG) PLACEMENT;  Surgeon: Iva Boop, MD;   [facility];  Service: Gastroenterology;  Laterality: N/A;   RADIOACTIVE SEED GUIDED AXILLARY SENTINEL LYMPH NODE Left 03/20/2022   Procedure: RADIOACTIVE SEED GUIDED LEFT AXILLARY SENTINEL LYMPH NODE BIOPSY;  Surgeon: Harriette Bouillon, MD;  [facility];  Service: General;  Laterality: Left;   RECTAL EXAM UNDER ANESTHESIA N/A 03/22/2022   Procedure: ANORECTAL EXAMINATION UNDER ANESTHESIA;  Surgeon: Karie Soda, MD;  [facility];  Service: General;  Laterality: N/A;   TONSILLECTOMY     TRANSURETHRAL RESECTION OF BLADDER TUMOR  2002   UPPER GASTROINTESTINAL ENDOSCOPY     VIDEO BRONCHOSCOPY  04/11/2022   Procedure: VIDEO BRONCHOSCOPY;  Surgeon: Josephine Igo, DO;  [facility];  Service: Cardiopulmonary;;   VIDEO BRONCHOSCOPY WITH ENDOBRONCHIAL NAVIGATION N/A 10/13/2019   Procedure: VIDEO BRONCHOSCOPY WITH ENDOBRONCHIAL NAVIGATION;  Surgeon: Josephine Igo, DO;  [facility];  Service: Pulmonary;  Laterality: N/A;   WISDOM TOOTH EXTRACTION     XI ROBOTIC ASSISTED LOWER ANTERIOR RESECTION N/A 12/14/2021   Procedure: XI ROBOTIC ASSISTED LOWER ANTERIOR ULTRA LOW RECTOSIGMOID RESECTION, COLOANAL HAND SEWN ANASTOMOSIS, AND BILATERAL TAP BLOCK;  Surgeon: Karie Soda, MD;  Location: [facility];  Service: General;  Laterality: N/A;     Outpatient Encounter Medications as of 03/26/2023  Medication Sig   acetaminophen (TYLENOL) 500 MG tablet Take 1,000 mg by mouth every 8 (eight) hours as needed for moderate pain.   aspirin 81 MG chewable tablet Chew 1 tablet (81 mg total) by mouth daily.   atorvastatin (LIPITOR) 20 MG tablet Take 1 tablet (20 mg total) by mouth daily.   Cholecalciferol (VITAMIN D3 PO) Take 2,000 Units by mouth daily.   diphenoxylate-atropine (LOMOTIL) 2.5-0.025 MG tablet Take 2 tablets by mouth 3 (three) times daily as needed for diarrhea or loose stools.   ferrous sulfate 325 (65 FE) MG EC tablet Take 1 tablet (325 mg total) by mouth 2 (two) times  daily.   folic acid (FOLVITE) 1 MG tablet TAKE 1 TABLET BY MOUTH EVERY DAY   lidocaine (XYLOCAINE) 5 % ointment Apply 1 Application topically 3 (three) times daily as needed for mild pain or moderate pain.   loperamide (IMODIUM A-D) 2 MG tablet Take 4 mg by mouth 4 (four) times daily as needed for diarrhea or loose stools.   mirtazapine (REMERON SOL-TAB) 30 MG disintegrating tablet TAKE 1 TABLET BY MOUTH AT BEDTIME.   Multiple Vitamins-Minerals (PRESERVISION AREDS 2) CAPS Take 1 capsule by mouth 2 (two) times daily.   Nutritional Supplements (ENTERADE IBS-D PO) Take by mouth.   OVER THE COUNTER MEDICATION Take 1 tablet by mouth daily. PROBIOTICS   OVER THE COUNTER MEDICATION Fiber and spice  pt take every morning   OVER THE COUNTER MEDICATION Juce pt takes every morning   pantoprazole (PROTONIX) 40 MG tablet Take 1 tablet (40 mg total) by mouth 2 (two) times daily.   prochlorperazine (COMPAZINE) 10 MG tablet Take 1 tablet (10 mg total) by mouth every 6 (six) hours as needed for nausea or vomiting.   No facility-administered encounter medications on file as of 03/26/2023.     Today's Vitals   03/26/23 1004 03/26/23 1005  BP: 116/79   Pulse: 100   Resp: 18   Temp: 98.3 F (36.8 C)   TempSrc: Temporal   SpO2: 99%   Weight: 133 lb (60.3 kg)   PainSc:  0-No pain   Body mass index is 19.64 kg/m.   PHYSICAL EXAM GENERAL:alert, no distress and comfortable SKIN: no rash  EYES: sclera clear NECK: without mass LYMPH:  no palpable cervical or supraclavicular lymphadenopathy  LUNGS: clear with normal breathing effort HEART: regular rate & rhythm, no lower extremity edema ABDOMEN: abdomen soft, non-tender and normal bowel sounds.  Feeding tube in place NEURO: alert & oriented x 3 with fluent speech, no focal motor/sensory deficits   CBC    Component Value Date/Time   WBC 3.7 (L) 03/26/2023 0936   WBC 6.2 12/04/2022 1225   RBC 3.30 (L) 03/26/2023 0936   HGB 10.4 (L) 03/26/2023 0936    HCT 31.9 (L) 03/26/2023 0936   PLT 180 03/26/2023 0936   MCV 96.7 03/26/2023 0936   MCH 31.5 03/26/2023 0936   MCHC 32.6 03/26/2023 0936   RDW 17.7 (H) 03/26/2023 0936   LYMPHSABS 0.9 03/26/2023 0936   MONOABS 0.3 03/26/2023 0936   EOSABS 0.0 03/26/2023 0936   BASOSABS 0.0 03/26/2023 0936     CMP     Component Value Date/Time   NA 141 03/26/2023 0936   K 3.8 03/26/2023 0936   CL 102 03/26/2023 0936   CO2 30 03/26/2023 0936   GLUCOSE 95 03/26/2023 0936   BUN 19 03/26/2023 0936   CREATININE 0.78 03/26/2023 0936   CALCIUM 9.9 03/26/2023 0936   PROT 7.6 03/26/2023 0936   ALBUMIN 4.0 03/26/2023 0936   AST 21 03/26/2023 0936   ALT 17 03/26/2023 0936   ALKPHOS 116 03/26/2023 0936   BILITOT 0.5 03/26/2023 0936   GFRNONAA >60 03/26/2023 0936   GFRAA >60 12/31/2019 1428     ASSESSMENT & PLAN:72 year old male recently   Stage IIb (T1 a, N1, M0) non-small cell lung cancer, adenocarcinoma -Diagnosed 09/2019 s/p left upper lobectomy with lymph node dissection on 12/16/19 under the care of Dr. Cliffton Asters.  -No actionable mutations and PD-L1 expression was 1%.  -S/p adjuvant Cisplatin/Alimta q21 days x4 cycles completed 04/07/20 -Restaging CT showed increased density of soft tissue in the left hilar region concerning for possible developing lymphadenopathy or disease recurrence.   -PET scan showed new hypermetabolic left axillary, mediastinal and left hilar adenopathy compatible with recurrent metastatic lung cancer  -S/p excisional biopsy of the left axillary lymph node that was not conclusive for malignancy. -S/p video bronchoscopy with EBUS by Dr. Tonia Brooms 12/27/243 and biopsy from the station 7 lymph node showed recurrent adenocarcinoma. -S/p concurrent chemoradiation with weekly carboplatin/paclitaxel for 6-7 weeks.  Status post 3 cycles of chemotherapy but he completed the course of radiation.  Admitted to the hospital on June 07, 2022 with significant sepsis and Klebsiella pneumonia  bacteremia.  -Repeat PET showed no concerning findings for disease progression except for suspicious slowly growing adenocarcinoma in  the right lower lobe but inflammatory changes in the right lung as well as suspicious reactive mediastinal lymphadenopathy.   -Repeat bronchoscopy by Dr. Tonia Brooms, final pathology was consistent with recurrent non-small cell lung cancer. -Began systemic chemotherapy with carboplatin/Alimta q21 days 12/25/2022.  Plan 4-6 cycles then maintenance if no progression -Mr. Kestler appears stable. S/p C4 Carbo/Alimta, tolerating very well with mild fatigue.  He is able to recover and function with good performance status.  There is no clinical evidence of disease progression -Labs reviewed, adequate to proceed with cycle 5 carbo/Alimta today as scheduled, same dose -Follow-up and cycle 6 on 12/31 as scheduled    GIST of the rectum  -Completed neoadjuvant imatinib 400 mg p.o. daily in April 2023.  Status post 24 months.   -S/p surgical resection of the remaining GIST per Dr. Michaell Cowing on 12/14/21 with very minimal residual disease.  He resumed his treatment with imatinib -He had persistent diarrhea despite discontinuation of imatinib, likely functional diarrhea after surgical resection of the pelvic GIST -Stopped imatinib a few months ago, diarrhea has improved. Continue supportive care  -Followed by GI Dr. Leone Payor    PLAN: -Labs reviewed -Proceed with cycle 5 carbo/Alimta today as scheduled, same dose -Continue supportive care/symptom management -Lab 12/17 as previously scheduled -Follow-up and cycle 6 in 3 weeks   All questions were answered. The patient knows to call the clinic with any problems, questions or concerns. No barriers to learning were detected. I spent 20 minutes counseling the patient face to face. The total time spent in the appointment was 30 minutes and more than 50% was on counseling, review of test results, and coordination of care.   Santiago Glad,  NP-C 03/26/2023

## 2023-03-25 MED FILL — Fosaprepitant Dimeglumine For IV Infusion 150 MG (Base Eq): INTRAVENOUS | Qty: 5 | Status: AC

## 2023-03-26 ENCOUNTER — Inpatient Hospital Stay: Payer: Medicare Other | Admitting: Nurse Practitioner

## 2023-03-26 ENCOUNTER — Inpatient Hospital Stay: Payer: Medicare Other

## 2023-03-26 ENCOUNTER — Encounter: Payer: Self-pay | Admitting: Nurse Practitioner

## 2023-03-26 VITALS — BP 109/69 | HR 77 | Temp 98.1°F | Resp 16

## 2023-03-26 VITALS — BP 116/79 | HR 100 | Temp 98.3°F | Resp 18 | Wt 133.0 lb

## 2023-03-26 DIAGNOSIS — Z9221 Personal history of antineoplastic chemotherapy: Secondary | ICD-10-CM | POA: Diagnosis not present

## 2023-03-26 DIAGNOSIS — C3490 Malignant neoplasm of unspecified part of unspecified bronchus or lung: Secondary | ICD-10-CM | POA: Diagnosis not present

## 2023-03-26 DIAGNOSIS — Z902 Acquired absence of lung [part of]: Secondary | ICD-10-CM | POA: Diagnosis not present

## 2023-03-26 DIAGNOSIS — C49A5 Gastrointestinal stromal tumor of rectum: Secondary | ICD-10-CM

## 2023-03-26 DIAGNOSIS — C3431 Malignant neoplasm of lower lobe, right bronchus or lung: Secondary | ICD-10-CM

## 2023-03-26 DIAGNOSIS — G629 Polyneuropathy, unspecified: Secondary | ICD-10-CM | POA: Diagnosis not present

## 2023-03-26 DIAGNOSIS — C3412 Malignant neoplasm of upper lobe, left bronchus or lung: Secondary | ICD-10-CM | POA: Diagnosis not present

## 2023-03-26 DIAGNOSIS — C3492 Malignant neoplasm of unspecified part of left bronchus or lung: Secondary | ICD-10-CM

## 2023-03-26 DIAGNOSIS — C771 Secondary and unspecified malignant neoplasm of intrathoracic lymph nodes: Secondary | ICD-10-CM | POA: Diagnosis not present

## 2023-03-26 DIAGNOSIS — Z923 Personal history of irradiation: Secondary | ICD-10-CM | POA: Diagnosis not present

## 2023-03-26 DIAGNOSIS — Z5111 Encounter for antineoplastic chemotherapy: Secondary | ICD-10-CM | POA: Diagnosis not present

## 2023-03-26 LAB — CBC WITH DIFFERENTIAL (CANCER CENTER ONLY)
Abs Immature Granulocytes: 0.01 10*3/uL (ref 0.00–0.07)
Basophils Absolute: 0 10*3/uL (ref 0.0–0.1)
Basophils Relative: 1 %
Eosinophils Absolute: 0 10*3/uL (ref 0.0–0.5)
Eosinophils Relative: 1 %
HCT: 31.9 % — ABNORMAL LOW (ref 39.0–52.0)
Hemoglobin: 10.4 g/dL — ABNORMAL LOW (ref 13.0–17.0)
Immature Granulocytes: 0 %
Lymphocytes Relative: 23 %
Lymphs Abs: 0.9 10*3/uL (ref 0.7–4.0)
MCH: 31.5 pg (ref 26.0–34.0)
MCHC: 32.6 g/dL (ref 30.0–36.0)
MCV: 96.7 fL (ref 80.0–100.0)
Monocytes Absolute: 0.3 10*3/uL (ref 0.1–1.0)
Monocytes Relative: 8 %
Neutro Abs: 2.5 10*3/uL (ref 1.7–7.7)
Neutrophils Relative %: 67 %
Platelet Count: 180 10*3/uL (ref 150–400)
RBC: 3.3 MIL/uL — ABNORMAL LOW (ref 4.22–5.81)
RDW: 17.7 % — ABNORMAL HIGH (ref 11.5–15.5)
WBC Count: 3.7 10*3/uL — ABNORMAL LOW (ref 4.0–10.5)
nRBC: 0 % (ref 0.0–0.2)

## 2023-03-26 LAB — CMP (CANCER CENTER ONLY)
ALT: 17 U/L (ref 0–44)
AST: 21 U/L (ref 15–41)
Albumin: 4 g/dL (ref 3.5–5.0)
Alkaline Phosphatase: 116 U/L (ref 38–126)
Anion gap: 9 (ref 5–15)
BUN: 19 mg/dL (ref 8–23)
CO2: 30 mmol/L (ref 22–32)
Calcium: 9.9 mg/dL (ref 8.9–10.3)
Chloride: 102 mmol/L (ref 98–111)
Creatinine: 0.78 mg/dL (ref 0.61–1.24)
GFR, Estimated: >60 mL/min (ref >60)
Glucose, Bld: 95 mg/dL (ref 70–99)
Potassium: 3.8 mmol/L (ref 3.5–5.1)
Sodium: 141 mmol/L (ref 135–145)
Total Bilirubin: 0.5 mg/dL (ref <1.2)
Total Protein: 7.6 g/dL (ref 6.5–8.1)

## 2023-03-26 MED ORDER — SODIUM CHLORIDE 0.9 % IV SOLN
422.5000 mg | Freq: Once | INTRAVENOUS | Status: AC
Start: 2023-03-26 — End: 2023-03-26
  Administered 2023-03-26: 420 mg via INTRAVENOUS
  Filled 2023-03-26: qty 42

## 2023-03-26 MED ORDER — SODIUM CHLORIDE 0.9 % IV SOLN
500.0000 mg/m2 | Freq: Once | INTRAVENOUS | Status: AC
  Administered 2023-03-26: 900 mg via INTRAVENOUS
  Filled 2023-03-26: qty 20

## 2023-03-26 MED ORDER — FOSAPREPITANT DIMEGLUMINE INJECTION 150 MG
150.0000 mg | Freq: Once | INTRAVENOUS | Status: AC
  Administered 2023-03-26: 150 mg via INTRAVENOUS
  Filled 2023-03-26: qty 150

## 2023-03-26 MED ORDER — PALONOSETRON HCL INJECTION 0.25 MG/5ML
0.2500 mg | Freq: Once | INTRAVENOUS | Status: AC
  Administered 2023-03-26: 0.25 mg via INTRAVENOUS
  Filled 2023-03-26: qty 5

## 2023-03-26 MED ORDER — SODIUM CHLORIDE 0.9 % IV SOLN: Freq: Once | INTRAVENOUS | Status: AC

## 2023-03-26 MED ORDER — DEXAMETHASONE SODIUM PHOSPHATE 10 MG/ML IJ SOLN
10.0000 mg | Freq: Once | INTRAMUSCULAR | Status: AC
  Administered 2023-03-26: 10 mg via INTRAVENOUS
  Filled 2023-03-26: qty 1

## 2023-03-26 NOTE — Patient Instructions (Signed)
[facility] - A DEPT OF [facility]  Discharge Instructions: Thank you for choosing [facility] to provide your oncology and hematology care.   If you have a lab appointment with the Cancer Center, please go directly to the Cancer Center and check in at the registration area.   Wear comfortable clothing and clothing appropriate for easy access to any Portacath or PICC line.   We strive to give you quality time with your provider. You may need to reschedule your appointment if you arrive late (15 or more minutes).  Arriving late affects you and other patients whose appointments are after yours.  Also, if you miss three or more appointments without notifying the office, you may be dismissed from the clinic at the provider's discretion.      For prescription refill requests, have your pharmacy contact our office and allow 72 hours for refills to be completed.    Today you received the following chemotherapy and/or immunotherapy agents: Alimta/Carboplatin      To help prevent nausea and vomiting after your treatment, we encourage you to take your nausea medication as directed.  BELOW ARE SYMPTOMS THAT SHOULD BE REPORTED IMMEDIATELY: *FEVER GREATER THAN 100.4 F (38 C) OR HIGHER *CHILLS OR SWEATING *NAUSEA AND VOMITING THAT IS NOT CONTROLLED WITH YOUR NAUSEA MEDICATION *UNUSUAL SHORTNESS OF BREATH *UNUSUAL BRUISING OR BLEEDING *URINARY PROBLEMS (pain or burning when urinating, or frequent urination) *BOWEL PROBLEMS (unusual diarrhea, constipation, pain near the anus) TENDERNESS IN MOUTH AND THROAT WITH OR WITHOUT PRESENCE OF ULCERS (sore throat, sores in mouth, or a toothache) UNUSUAL RASH, SWELLING OR PAIN  UNUSUAL VAGINAL DISCHARGE OR ITCHING   Items with * indicate a potential emergency and should be followed up as soon as possible or go to the Emergency Department if any problems should occur.  Please show the CHEMOTHERAPY ALERT CARD or  IMMUNOTHERAPY ALERT CARD at check-in to the Emergency Department and triage nurse.  Should you have questions after your visit or need to cancel or reschedule your appointment, please contact [facility] - A DEPT OF [facility]  Dept: [phone]  and follow the prompts.  Office hours are 8:00 a.m. to 4:30 p.m. Monday - Friday. Please note that voicemails left after 4:00 p.m. may not be returned until the following business day.  We are closed weekends and major holidays. You have access to a nurse at all times for urgent questions. Please call the main number to the clinic Dept: [phone] and follow the prompts.   For any non-urgent questions, you may also contact your provider using MyChart. We now offer e-Visits for anyone 25 and older to request care online for non-urgent symptoms. For details visit [email or web address].   Also download the MyChart app! Go to the app store, search "MyChart", open the app, select [place], and log in with your MyChart username and password.

## 2023-04-02 ENCOUNTER — Inpatient Hospital Stay: Payer: Medicare Other

## 2023-04-02 DIAGNOSIS — G629 Polyneuropathy, unspecified: Secondary | ICD-10-CM | POA: Diagnosis not present

## 2023-04-02 DIAGNOSIS — Z5111 Encounter for antineoplastic chemotherapy: Secondary | ICD-10-CM | POA: Diagnosis not present

## 2023-04-02 DIAGNOSIS — C771 Secondary and unspecified malignant neoplasm of intrathoracic lymph nodes: Secondary | ICD-10-CM | POA: Diagnosis not present

## 2023-04-02 DIAGNOSIS — C3412 Malignant neoplasm of upper lobe, left bronchus or lung: Secondary | ICD-10-CM | POA: Diagnosis not present

## 2023-04-02 DIAGNOSIS — C3431 Malignant neoplasm of lower lobe, right bronchus or lung: Secondary | ICD-10-CM

## 2023-04-02 DIAGNOSIS — C49A5 Gastrointestinal stromal tumor of rectum: Secondary | ICD-10-CM | POA: Diagnosis not present

## 2023-04-02 DIAGNOSIS — Z9221 Personal history of antineoplastic chemotherapy: Secondary | ICD-10-CM | POA: Diagnosis not present

## 2023-04-02 DIAGNOSIS — Z923 Personal history of irradiation: Secondary | ICD-10-CM | POA: Diagnosis not present

## 2023-04-02 DIAGNOSIS — Z902 Acquired absence of lung [part of]: Secondary | ICD-10-CM | POA: Diagnosis not present

## 2023-04-02 LAB — CBC WITH DIFFERENTIAL (CANCER CENTER ONLY)
Abs Immature Granulocytes: 0.01 10*3/uL (ref 0.00–0.07)
Basophils Absolute: 0 10*3/uL (ref 0.0–0.1)
Basophils Relative: 1 %
Eosinophils Absolute: 0 10*3/uL (ref 0.0–0.5)
Eosinophils Relative: 1 %
HCT: 30.7 % — ABNORMAL LOW (ref 39.0–52.0)
Hemoglobin: 9.9 g/dL — ABNORMAL LOW (ref 13.0–17.0)
Immature Granulocytes: 1 %
Lymphocytes Relative: 38 %
Lymphs Abs: 0.6 10*3/uL — ABNORMAL LOW (ref 0.7–4.0)
MCH: 30.5 pg (ref 26.0–34.0)
MCHC: 32.2 g/dL (ref 30.0–36.0)
MCV: 94.5 fL (ref 80.0–100.0)
Monocytes Absolute: 0.1 10*3/uL (ref 0.1–1.0)
Monocytes Relative: 8 %
Neutro Abs: 0.8 10*3/uL — ABNORMAL LOW (ref 1.7–7.7)
Neutrophils Relative %: 51 %
Platelet Count: 126 10*3/uL — ABNORMAL LOW (ref 150–400)
RBC: 3.25 MIL/uL — ABNORMAL LOW (ref 4.22–5.81)
RDW: 16.1 % — ABNORMAL HIGH (ref 11.5–15.5)
WBC Count: 1.5 10*3/uL — ABNORMAL LOW (ref 4.0–10.5)
nRBC: 0 % (ref 0.0–0.2)

## 2023-04-02 LAB — CMP (CANCER CENTER ONLY)
ALT: 13 U/L (ref 0–44)
AST: 19 U/L (ref 15–41)
Albumin: 3.8 g/dL (ref 3.5–5.0)
Alkaline Phosphatase: 112 U/L (ref 38–126)
Anion gap: 6 (ref 5–15)
BUN: 23 mg/dL (ref 8–23)
CO2: 29 mmol/L (ref 22–32)
Calcium: 9.5 mg/dL (ref 8.9–10.3)
Chloride: 101 mmol/L (ref 98–111)
Creatinine: 0.61 mg/dL (ref 0.61–1.24)
GFR, Estimated: >60 mL/min (ref >60)
Glucose, Bld: 98 mg/dL (ref 70–99)
Potassium: 4.2 mmol/L (ref 3.5–5.1)
Sodium: 136 mmol/L (ref 135–145)
Total Bilirubin: 0.6 mg/dL (ref <1.2)
Total Protein: 6.9 g/dL (ref 6.5–8.1)

## 2023-04-08 ENCOUNTER — Encounter: Payer: Self-pay | Admitting: Internal Medicine

## 2023-04-11 NOTE — Progress Notes (Signed)
 Palliative Medicine [facility]  Telephone:(336) [phone] Fax:(336) [phone]   Name: Jesse [name] Date: 04/11/2023 MRN: [id number]  DOB: 12-13-1950  Patient Care Team: [name], MD as PCP - General (Family Medicine) Sherrod Sherrod, MD as Consulting Physician (Oncology) Liane Sharyne MATSU, [facility] (Inactive) as Pharmacist (Pharmacist) Avram Lupita BRAVO, MD as Consulting Physician (Gastroenterology) Brenna Adine CROME, DO as Consulting Physician (Pulmonary Disease) Alvaro Ricardo KATHEE Raddle., MD as Consulting Physician (Urology) Sheldon Standing, MD as Consulting Physician (General Surgery) Okey Vina GAILS, MD as Consulting Physician (Cardiology)    INTERVAL HISTORY: Jesse [name] is a 72 y.o. 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 Previous medical history also includes hypertension, hyperlipidemia, GERD, anemia, COPD, and macular degeneration and a stroke during most recent hospitalization. Surgical history includes ileostomy and takedown. Peg tube placed (07/04/2022) for esophageal stricture/dysphagia. Palliative asked to see for symptom management and goals of care.   SOCIAL HISTORY:     reports that he quit smoking about 3 years ago. His smoking use included cigarettes. He started smoking about 43 years ago. He has a 20 pack-year smoking history. He has never used smokeless tobacco. He reports that he does not currently use alcohol after a past usage of about 15.0 standard drinks of alcohol per week. He reports that he does not currently use drugs after having used the following drugs: Marijuana.  ADVANCE DIRECTIVES:  Advanced directives on file  CODE STATUS: Full code  PAST MEDICAL HISTORY: Past Medical History:  Diagnosis Date   Anemia    Cancer (HCC)    bladder cancer   COPD (chronic obstructive pulmonary disease)  (HCC)    mild    DIVERTICULOSIS, COLON 01/14/2007   Qualifier: Diagnosis of  By: Nunzio RN, Dagoberto Caldron    GASTRITIS, CHRONIC 06/01/2004   Qualifier: Diagnosis of  By: Bettie CMA (AAMA), Robin     GERD 01/09/2007   Qualifier: Diagnosis of  By: Krystal, RN, Ellen     Heart murmur    as a child; no murmur heard 12/14/19   History of radiation therapy    Mediastinum- 05/15/22-06/28/22-Dr. Lynwood Nasuti   HYPERLIPIDEMIA 01/09/2007   Qualifier: Diagnosis of  By: Krystal OBIE Czar     Hypertension    Lung mass    left upper nodule   Macular degeneration    right eye   NEOPLASM, MALIGNANT, BLADDER, HX OF 2002   Qualifier: Diagnosis of  By: Bettie CMA (AAMA), Robin  / no chemo or radiation   OSTEOARTHRITIS 01/14/2007   Qualifier: Diagnosis of  By: Nunzio, RN, Dagoberto Caldron - knees   Rectal mass    Reflux esophagitis 06/01/2004   Qualifier: Diagnosis of  By: Bettie CMA (AAMA), Robin     Stroke [facility])    TOBACCO USER 02/16/2009   Qualifier: Diagnosis of  By: Jame  MD, Maude FALCON    VITAMIN D  DEFICIENCY 06/03/2007   Qualifier: Diagnosis of  By: Jame  MD, Maude FALCON     ALLERGIES:  has no known allergies.  MEDICATIONS:  Current Outpatient Medications  Medication Sig Dispense Refill   acetaminophen  (TYLENOL ) 500 MG tablet Take 1,000 mg by mouth every 8 (eight) hours as needed for moderate pain.     aspirin  81 MG chewable tablet Chew 1 tablet (81 mg total) by mouth daily.     atorvastatin  (LIPITOR) 20 MG  tablet Take 1 tablet (20 mg total) by mouth daily. 90 tablet 3   Cholecalciferol  (VITAMIN D3 PO) Take 2,000 Units by mouth daily.     diphenoxylate -atropine  (LOMOTIL ) 2.5-0.025 MG tablet Take 2 tablets by mouth 3 (three) times daily as needed for diarrhea or loose stools. 90 tablet 0   ferrous sulfate  325 (65 FE) MG EC tablet Take 1 tablet (325 mg total) by mouth 2 (two) times daily. 60 tablet 1   folic acid  (FOLVITE ) 1 MG tablet TAKE 1 TABLET BY MOUTH EVERY DAY 90 tablet 1   lidocaine   (XYLOCAINE ) 5 % ointment Apply 1 Application topically 3 (three) times daily as needed for mild pain or moderate pain. 35.44 g 3   loperamide  (IMODIUM  A-D) 2 MG tablet Take 4 mg by mouth 4 (four) times daily as needed for diarrhea or loose stools.     mirtazapine  (REMERON  SOL-TAB) 30 MG disintegrating tablet TAKE 1 TABLET BY MOUTH AT BEDTIME. 90 tablet 1   Multiple Vitamins-Minerals (PRESERVISION AREDS 2) CAPS Take 1 capsule by mouth 2 (two) times daily.     Nutritional Supplements (ENTERADE IBS-D PO) Take by mouth.     OVER THE COUNTER MEDICATION Take 1 tablet by mouth daily. PROBIOTICS     OVER THE COUNTER MEDICATION Fiber and spice pt take every morning     OVER THE COUNTER MEDICATION Juce pt takes every morning     pantoprazole  (PROTONIX ) 40 MG tablet Take 1 tablet (40 mg total) by mouth 2 (two) times daily. 60 tablet 3   prochlorperazine  (COMPAZINE ) 10 MG tablet Take 1 tablet (10 mg total) by mouth every 6 (six) hours as needed for nausea or vomiting. 30 tablet 0   No current facility-administered medications for this visit.    VITAL SIGNS: There were no vitals taken for this visit. There were no vitals filed for this visit.  Estimated body mass index is 19.64 kg/m as calculated from the following:   Height as of 03/21/23: 5' 9 (1.753 m).   Weight as of 03/26/23: 133 lb (60.3 kg).   PERFORMANCE STATUS (ECOG) : 1 - Symptomatic but completely ambulatory   Physical Exam General: NAD,  Pulmonary: normal breathing pattern  Skin: no rashes Neurological: AAO x4  IMPRESSION: I saw [name] during his infusion. No acute distress. Patient is doing well overall.  Continues to have challenges with IV access for infusions. Shares he is seriously considering placement of Port-a-Cath. We discussed benefits given limited vein access and knowledge of ongoing treatment needs. He plans to discuss further with his wife. Taking things one day at a time. Denies nausea, vomiting, constipation.  Continues to manage stools and feedings. States he does not require as frequent use of lomotil  for loose stools. Denies pain. No symptom management needs at this time. Knows to contact office as needed. Much appreciative of overall quality of life.    Goals of care  8/27- Jesse [name] and his wife are realistic in their understanding regarding his cancer and recent progression. He is clear in his expressed wishes to continue to treat the treatable remaining hopeful for stability. His wife is hopeful that he is able to tolerate treatment. Their goal is to closely watch his quality of life ensuring he is doing as well as he can. Signe is emotional stating he knows if he does nothing he will die and he is not ready to give up yet. Support provided.    4/9- Jesse [name] wishes to be a FULL  CODE and pursue treatments that can improve his level of health and functioning. He has HCPOA listing his wife as management consultant, and Living Will documents on file. Patient and wife interested in MOST form to help consolidate wishes onto one form that could be honored by medical providers/EMS in the event of an emergency. They are taking form home for review and to decide if they would like to complete at next visit  I discussed the importance of continued conversation with family and their medical providers regarding overall plan of care and treatment options, ensuring decisions are within the context of the patients values and GOCs.  PLAN: Lomotil  as needed up to four times daily.  Consider Port placement for IV access.  Goals of care discussions. Patient is clear in expressed wishes to treat the treatable allowing him an opportunity to continue thriving.   Ongoing goals of care support. As needed Palliative will plan to see patient back in 6-8 weeks in collaboration to other oncology appointments. He knows to contact office as needed.    Patient expressed understanding and was in agreement with this plan. He  also understands that He can call the clinic at any time with any questions, concerns, or complaints.    Visit consisted of counseling and education dealing with the complex and emotionally intense issues of symptom management and palliative care in the setting of serious and potentially life-threatening illness.  Levon Borer, AGPCNP-BC  Palliative Medicine Team/[facility]

## 2023-04-12 NOTE — Progress Notes (Signed)
 [facility] OFFICE PROGRESS NOTE  Jesse Clotilda SAUNDERS, MD [address] [place] [address]  DIAGNOSIS: 1) recurrent non-small cell lung cancer, adenocarcinoma with positive mediastinal lymph node in December 2023 that was initially diagnosed as stage IIB (T1 a, N1, M0) non-small cell lung cancer, adenocarcinoma diagnosed in June 2021.  The patient also has evidence for recurrent disease in the mediastinal lymph node in January 2024 and in the right lower lobe in August 2024. 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   Biomarker Findings Tumor Mutational Burden - 20 Muts/Mb Microsatellite status - MS-Stable Genomic Findings For a complete list of the genes assayed, please refer to the Appendix. KEAP1 I461fs*17 DNMT3A E817* RB1 splice site 1960+5G>C TP53 R209* 8 Disease relevant genes with no reportable alterations: ALK, BRAF, EGFR, ERBB2, KRAS, MET, RET, ROS1   PDL1 Expression: 1 %  PRIOR THERAPY: 1) status post left upper lobectomy with lymph node dissection on December 16, 2019 under the care of Dr. Shyrl.  The tumor size measured 0.9 cm but there was involvement of the level 10 L and 11 L.  2) Adjuvant systemic chemotherapy with cisplatin  75 mg/M2 and Alimta  500 mg/M2 every 3 weeks.  First dose February 04, 2020.  Status post 4 cycles. 3) Neoadjuvant treatment with imatinib  400 mg p.o. daily for the GIST tumor of the pelvic area.  First dose started July 18, 2020.  Status post 12 months of treatment. 4) status post robotic assisted very low anterior rectosigmoid resection with coloanal anastomosis, diverting loop ileostomy and wedge liver biopsy under the care of Dr. Sheldon on December 14, 2021 and it showed minimal residual gastrointestinal stromal tumor. 5) concurrent chemoradiation with weekly carboplatin  for AUC of 2 and paclitaxel  45 Mg/M2.  First dose May 14, 2022. 6) Imatinib  400 mg p.o. daily  CURRENT THERAPY: Systemic chemotherapy with  carboplatin  for AUC of 5 and Alimta  500 Mg/M2 every 3 weeks.  First dose 12/25/2022.  Status post 5 cycles.   INTERVAL HISTORY: Jesse [name] 72 y.o. male returns to the clinic today for a follow-up visit accompanied by his wife.  The patient was last seen by my colleague, Lacie, 3 weeks ago on 03/26/23. The patient is currently undergoing treatment with carboplatin  and Alimta  for disease recurrence in the opposite lung which was found in August 2024.  He is status post 6 cycles and is tolerated it fair. He states that overall his last round of treatment was fine.  He does have fatigue for 2-4 days after treatment which is stable. Yesterday he was hauling wood around for 4 hours and was able to do this without any issues. The patient has a good appetite but does struggle to gain weight.  However he has not lost any weight recently but continues to be cachectic appearing.  He previously had a feeding tube but has not been using this.  He does not follow with nutrition on a regular basis since he no longer has a feeding tube.  He denies any fever, chills, or night sweats. He does have cold intolerance at baseline. 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 He sees a land. His left hip normally bothers him more but more recently his right hip has started to bother him. He denies any chest pain, cough, or hemoptysis.  He may occasionally have mild dyspnea on exertion.  Denies any nausea or vomiting.  He is following closely  with Dr. Avram for his gastroenterology concerns.  He saw them last on 03/21/23.  The patient reports that he has frequent bowel movements but they are often hard and he does not produce a large volume of stool. Overall, this seems to be getting better.  He denies any headache or vision changes.  He is followed closely by palliative care and is scheduled to see them today on 04/16/23. He is here today for evaluation repeat  blood work before undergoing cycle #6     MEDICAL HISTORY: Past Medical History:  Diagnosis Date   Anemia    Cancer (HCC)    bladder cancer   COPD (chronic obstructive pulmonary disease) (HCC)    mild    DIVERTICULOSIS, COLON 01/14/2007   Qualifier: Diagnosis of  By: Nunzio RN, Dagoberto Caldron    GASTRITIS, CHRONIC 06/01/2004   Qualifier: Diagnosis of  By: Bettie CMA (AAMA), Robin     GERD 01/09/2007   Qualifier: Diagnosis of  By: Krystal, RN, Ellen     Heart murmur    as a child; no murmur heard 12/14/19   History of radiation therapy    Mediastinum- 05/15/22-06/28/22-Dr. Lynwood Nasuti   HYPERLIPIDEMIA 01/09/2007   Qualifier: Diagnosis of  By: Krystal OBIE Czar     Hypertension    Lung mass    left upper nodule   Macular degeneration    right eye   NEOPLASM, MALIGNANT, BLADDER, HX OF 2002   Qualifier: Diagnosis of  By: Bettie CMA (AAMA), Robin  / no chemo or radiation   OSTEOARTHRITIS 01/14/2007   Qualifier: Diagnosis of  By: Nunzio, RN, Dagoberto Caldron - knees   Rectal mass    Reflux esophagitis 06/01/2004   Qualifier: Diagnosis of  By: Bettie CMA (AAMA), Robin     Stroke [facility])    TOBACCO USER 02/16/2009   Qualifier: Diagnosis of  By: Jame  MD, Maude FALCON    VITAMIN D  DEFICIENCY 06/03/2007   Qualifier: Diagnosis of  By: Jame  MD, Maude FALCON     ALLERGIES:  has no known allergies.  MEDICATIONS:  Current Outpatient Medications  Medication Sig Dispense Refill   acetaminophen  (TYLENOL ) 500 MG tablet Take 1,000 mg by mouth every 8 (eight) hours as needed for moderate pain.     aspirin  81 MG chewable tablet Chew 1 tablet (81 mg total) by mouth daily.     atorvastatin  (LIPITOR) 20 MG tablet Take 1 tablet (20 mg total) by mouth daily. 90 tablet 3   Cholecalciferol  (VITAMIN D3 PO) Take 2,000 Units by mouth daily.     diphenoxylate -atropine  (LOMOTIL ) 2.5-0.025 MG tablet Take 2 tablets by mouth 3 (three) times daily as needed for diarrhea or loose stools. 90 tablet 0   ferrous  sulfate 325 (65 FE) MG EC tablet Take 1 tablet (325 mg total) by mouth 2 (two) times daily. 60 tablet 1   folic acid  (FOLVITE ) 1 MG tablet TAKE 1 TABLET BY MOUTH EVERY DAY 90 tablet 1   lidocaine  (XYLOCAINE ) 5 % ointment Apply 1 Application topically 3 (three) times daily as needed for mild pain or moderate pain. 35.44 g 3   loperamide  (IMODIUM  A-D) 2 MG tablet Take 4 mg by mouth 4 (four) times daily as needed for diarrhea or loose stools.     mirtazapine  (REMERON  SOL-TAB) 30 MG disintegrating tablet TAKE 1 TABLET BY MOUTH AT BEDTIME. 90 tablet 1   Multiple Vitamins-Minerals (PRESERVISION AREDS 2) CAPS Take 1 capsule by mouth 2 (two) times daily.  OVER THE COUNTER MEDICATION Take 1 tablet by mouth daily. PROBIOTICS     OVER THE COUNTER MEDICATION Fiber and spice pt take every morning     OVER THE COUNTER MEDICATION Juce pt takes every morning     pantoprazole  (PROTONIX ) 40 MG tablet Take 1 tablet (40 mg total) by mouth 2 (two) times daily. 60 tablet 3   prochlorperazine  (COMPAZINE ) 10 MG tablet Take 1 tablet (10 mg total) by mouth every 6 (six) hours as needed for nausea or vomiting. 30 tablet 0   Nutritional Supplements (ENTERADE IBS-D PO) Take by mouth.     No current facility-administered medications for this visit.   Facility-Administered Medications Ordered in Other Visits  Medication Dose Route Frequency Provider Last Rate Last Admin   0.9 %  sodium chloride  infusion   Intravenous Once Mohamed, Mohamed, MD       CARBOplatin  (PARAPLATIN ) 420 mg in sodium chloride  0.9 % 250 mL chemo infusion  420 mg Intravenous Once Sherrod Sherrod, MD       cyanocobalamin  (VITAMIN B12) injection 1,000 mcg  1,000 mcg Intramuscular Once Delilah Mulgrew L, PA-C       dexamethasone  (DECADRON ) injection 10 mg  10 mg Intravenous Once Mohamed, Mohamed, MD       fosaprepitant  (EMEND) 150 mg in sodium chloride  0.9 % 145 mL IVPB  150 mg Intravenous Once Mohamed, Mohamed, MD       palonosetron  (ALOXI )  injection 0.25 mg  0.25 mg Intravenous Once Mohamed, Mohamed, MD       PEMEtrexed  (ALIMTA ) 900 mg in sodium chloride  0.9 % 100 mL chemo infusion  500 mg/m2 (Treatment Plan Recorded) Intravenous Once Sherrod Sherrod, MD        SURGICAL HISTORY:  Past Surgical History:  Procedure Laterality Date   BIOPSY  06/18/2022   Procedure: BIOPSY;  Surgeon: San Sandor GAILS, DO;  Location: [facility];  Service: Gastroenterology;;   BREAST BIOPSY Left 03/16/2022   US  LT RADIOACTIVE SEED LOC 03/16/2022 [facility]   BRONCHIAL BIOPSY  10/13/2019   Procedure: BRONCHIAL BIOPSIES;  Surgeon: Brenna Adine CROME, DO;  [facility]: [facility];  Service: Pulmonary;;   BRONCHIAL BIOPSY  12/04/2022   Procedure: BRONCHIAL BIOPSIES;  Surgeon: Brenna Adine CROME, DO;  [facility];  Service: Pulmonary;;   BRONCHIAL BRUSHINGS  10/13/2019   Procedure: BRONCHIAL BRUSHINGS;  Surgeon: Brenna Adine CROME, DO;  [facility];  Service: Pulmonary;;   BRONCHIAL NEEDLE ASPIRATION BIOPSY  10/13/2019   Procedure: BRONCHIAL NEEDLE ASPIRATION BIOPSIES;  Surgeon: Brenna Adine CROME, DO;  [facility];  Service: Pulmonary;;   BRONCHIAL NEEDLE ASPIRATION BIOPSY  04/11/2022   Procedure: BRONCHIAL NEEDLE ASPIRATION BIOPSIES;  Surgeon: Brenna Adine CROME, DO;  [facility];  Service: Cardiopulmonary;;   BRONCHIAL NEEDLE ASPIRATION BIOPSY  12/04/2022   Procedure: BRONCHIAL NEEDLE ASPIRATION BIOPSIES;  Surgeon: Brenna Adine CROME, DO;  [facility];  Service: Pulmonary;;   BRONCHIAL WASHINGS  10/13/2019   Procedure: BRONCHIAL WASHINGS;  Surgeon: Brenna Adine CROME, DO;  [facility];  Service: Pulmonary;;   BRONCHIAL WASHINGS  12/04/2022   Procedure: BRONCHIAL WASHINGS;  Surgeon: Brenna Adine CROME, DO;  [facility];  Service: Pulmonary;;   COLONOSCOPY     multiple   DIVERTING ILEOSTOMY N/A 12/14/2021   Procedure: DIVERTING ILEOSTOMY;  Surgeon: Sheldon Standing, MD;  [facility]: [facility];   Service: General;  Laterality: N/A;   ENDOBRONCHIAL ULTRASOUND Bilateral 04/11/2022   Procedure: ENDOBRONCHIAL ULTRASOUND;  Surgeon: Brenna Adine CROME, DO;  [facility];  Service: Cardiopulmonary;  Laterality: Bilateral;   ESOPHAGEAL DILATION  06/18/2022   Procedure: ESOPHAGEAL DILATION;  Surgeon: San Sandor GAILS, DO;  [facility];  Service: Gastroenterology;;   ESOPHAGOGASTRODUODENOSCOPY (EGD) WITH PROPOFOL  N/A 06/18/2022   Procedure: ESOPHAGOGASTRODUODENOSCOPY (EGD) WITH PROPOFOL ;  Surgeon: San Sandor GAILS, DO;  [facility];  Service: Gastroenterology;  Laterality: N/A;  Severe dysphagia/odynophagia ?radiation esophagitis versus oral candidiasis   FIDUCIAL MARKER PLACEMENT  12/04/2022   Procedure: FIDUCIAL MARKER PLACEMENT;  Surgeon: Brenna Adine CROME, DO;  [facility];  Service: Pulmonary;;   ILEOSTOMY CLOSURE N/A 03/22/2022   Procedure: OPEN TAKEDOWN OF LOOP ILEOSTOMY;  Surgeon: Sheldon Standing, MD;  [facility]: [facility];  Service: General;  Laterality: N/A;  GEN w/ERAS PATHWAY LOCAL   INTERCOSTAL NERVE BLOCK Left 12/16/2019   Procedure: INTERCOSTAL NERVE BLOCK;  Surgeon: Shyrl Linnie KIDD, MD;  [facility];  Service: Thoracic;  Laterality: Left;   IR FLUORO RM 30-60 MIN  07/02/2022   KNEE SURGERY  07/20/2008   bilat.   NASAL SEPTUM SURGERY  1995   NODE DISSECTION Left 12/16/2019   Procedure: NODE DISSECTION;  Surgeon: Shyrl Linnie KIDD, MD;  [facility];  Service: Thoracic;  Laterality: Left;   PEG PLACEMENT N/A 07/04/2022   Procedure: PERCUTANEOUS ENDOSCOPIC GASTROSTOMY (PEG) PLACEMENT;  Surgeon: Avram Lupita BRAVO, MD;  [facility]: [facility];  Service: Gastroenterology;  Laterality: N/A;   RADIOACTIVE SEED GUIDED AXILLARY SENTINEL LYMPH NODE Left 03/20/2022   Procedure: RADIOACTIVE SEED GUIDED LEFT AXILLARY SENTINEL LYMPH NODE BIOPSY;  Surgeon: Vanderbilt Ned, MD;  [facility];  Service: General;  Laterality: Left;   RECTAL EXAM UNDER ANESTHESIA  N/A 03/22/2022   Procedure: ANORECTAL EXAMINATION UNDER ANESTHESIA;  Surgeon: Sheldon Standing, MD;  [facility]: [facility];  Service: General;  Laterality: N/A;   TONSILLECTOMY     TRANSURETHRAL RESECTION OF BLADDER TUMOR  2002   UPPER GASTROINTESTINAL ENDOSCOPY     VIDEO BRONCHOSCOPY  04/11/2022   Procedure: VIDEO BRONCHOSCOPY;  Surgeon: Brenna Adine CROME, DO;  [facility];  Service: Cardiopulmonary;;   VIDEO BRONCHOSCOPY WITH ENDOBRONCHIAL NAVIGATION N/A 10/13/2019   Procedure: VIDEO BRONCHOSCOPY WITH ENDOBRONCHIAL NAVIGATION;  Surgeon: Brenna Adine CROME, DO;  [facility];  Service: Pulmonary;  Laterality: N/A;   WISDOM TOOTH EXTRACTION     XI ROBOTIC ASSISTED LOWER ANTERIOR RESECTION N/A 12/14/2021   Procedure: XI ROBOTIC ASSISTED LOWER ANTERIOR ULTRA LOW RECTOSIGMOID RESECTION, COLOANAL HAND SEWN ANASTOMOSIS, AND BILATERAL TAP BLOCK;  Surgeon: Sheldon Standing, MD;  Location: [facility];  Service: General;  Laterality: N/A;    REVIEW OF SYSTEMS:   Constitutional: Positive for stable fatigue.  Negative for appetite change, chills,  fever and unexpected weight change.  HENT:  Negative for mouth sores, nosebleeds, sore throat and trouble swallowing.   Eyes: Negative for eye problems and icterus.  Respiratory: Positive for mild occasional dyspnea on exertion.  Negative for cough, hemoptysis, and wheezing.   Cardiovascular: Negative for chest pain and leg swelling.  Gastrointestinal: Positive for improving irregular bowel movements. Negative for nausea and vomiting.  Genitourinary: Negative for bladder incontinence, difficulty urinating, dysuria, frequency and hematuria.   Musculoskeletal: Negative for back pain, gait problem, neck pain and neck stiffness.  Skin: Negative for itching and rash.  Neurological: Negative for dizziness, extremity weakness, gait problem, headaches, light-headedness and seizures.  Hematological: Negative for adenopathy. Does not bruise/bleed easily.   Psychiatric/Behavioral: Negative for confusion, depression and sleep disturbance. The patient is not nervous/anxious.  PHYSICAL EXAMINATION:  Blood pressure 135/79, pulse 90, temperature 97.6 F (36.4 C), temperature source Oral, resp. rate 17, height 5' 9 (1.753 m), weight 135 lb 4.8 oz (61.4 kg), SpO2 99%.  ECOG PERFORMANCE STATUS: 2  Physical Exam  Constitutional: Oriented to person, place, and time and cachetic appearing male and in no distress.  HENT:  Head: Normocephalic and atraumatic.  Mouth/Throat: Oropharynx is clear and moist. No oropharyngeal exudate.  Eyes: Conjunctivae are normal. Right eye exhibits no discharge. Left eye exhibits no discharge. No scleral icterus.  Neck: Normal range of motion. Neck supple.  Cardiovascular: Normal rate, regular rhythm, normal heart sounds and intact distal pulses.   Pulmonary/Chest: Effort normal and breath sounds normal. No respiratory distress. No wheezes. No rales.  Abdominal: Soft. Bowel sounds are normal. Exhibits no distension and no mass. There is no tenderness.  Musculoskeletal: Normal range of motion. Exhibits no edema.  Lymphadenopathy:    No cervical adenopathy.  Neurological: Alert and oriented to person, place, and time. Exhibits muscle wasting. Gait normal. Coordination normal.  Skin: Skin is warm and dry. No rash noted. Not diaphoretic. No erythema. No pallor.  Psychiatric: Mood, memory and judgment normal.  Vitals reviewed.  LABORATORY DATA: Lab Results  Component Value Date   WBC 2.7 (L) 04/16/2023   HGB 9.3 (L) 04/16/2023   HCT 28.5 (L) 04/16/2023   MCV 95.3 04/16/2023   PLT 174 04/16/2023      Chemistry      Component Value Date/Time   NA 139 04/16/2023 0819   K 4.3 04/16/2023 0819   CL 103 04/16/2023 0819   CO2 31 04/16/2023 0819   BUN 21 04/16/2023 0819   CREATININE 0.71 04/16/2023 0819      Component Value Date/Time   CALCIUM  9.7 04/16/2023 0819   ALKPHOS 136 (H) 04/16/2023 0819   AST 28  04/16/2023 0819   ALT 49 (H) 04/16/2023 0819   BILITOT 0.4 04/16/2023 0819       RADIOGRAPHIC STUDIES:  No results found.   ASSESSMENT/PLAN:  This is a very pleasant 72 year old Caucasian male diagnosed with recurrent disease but he was initially diagnosed as stage IIIb (T1a, N1, M0) non-small cell lung cancer, adenocarcinoma.  He was initially diagnosed in June 2021 and is status post left upper lobectomy with lymph node dissection on 12/16/2019 to the care of Dr. Shyrl.  The patient has no actionable mutations and his PD-L1 expression is 1%.    For his lung cancer, the patient initially completed adjuvant systemic chemotherapy with cisplatin  75 mg/m and Alimta  500 mg/m IV every 3 weeks status post 4 cycles.    For the pelvic GIST tumor, the patient completed neoadjuvant treatment with imatinib  400 mg p.o. daily in April 2023.  He patient underwent surgical resection of the remaining gastrointestinal stromal tumor under the care of Dr. Sheldon on December 14, 2021 with very minimal residual disease.     In November 2023, his restaging CT scan for his lung cancer showed increased soft tissue density in the left hilar region concerning for possible developing lymphadenopathy or disease recurrence.  A PET scan was performed and showed new hypermetabolic left axillary, mediastinal, and left hilar adenopathy compatible with disease recurrence.   He underwent excisional biopsy of left axillary lymph node which was not conclusive of malignancy.   Therefore Dr. Brenna performed a repeat video bronchoscopy with EBUS on 04/11/2022 and the biopsy from the station 7 lymph node showed recurrent adenocarcinoma.   Therefore the patient is  currently undergoing concurrent chemoradiation with carboplatin  for an AUC of 2 and paclitaxel  45 mg/m. His first dose was on 05/14/2022.    Repeat PET scan showed no concerning findings for disease progression except for suspicious slowly growing adenocarcinoma in the  right lower lobe but inflammatory changes in the right lung as well as suspicious reactive mediastinal lymphadenopathy.  The patient had repeat bronchoscopy by Dr. Brenna and the final pathology was consistent with recurrent non-small cell lung cancer. The patient started systemic chemotherapy with carboplatin  for AUC of 5 and Alimta  500 Mg/M2 on 12/25/2022.  Status post 5 cycles.  The patient has been tolerating his treatment fairly well.   Per Dr. Sherrod, the patient would receive 4-6 cycles of carboplatin  and Alimta .  This to be followed by maintenance Alimta  IV every 3 weeks unless there is evidence of disease progression or unacceptable toxicity.  Therefore, I will arrange for a restaging CT scan of the CAP prior to his next appointment.   The patient did tell me he has some trouble with pain in his left and right hips for which she sees a land.  I offered to give him Tylenol  today but his pain have subsided.  I did pull up prior scans from April 2024 that does show arthritis in the hips bilaterally with the right being greater than the left.  Of course his upcoming scan will also include the pelvis we will ensure there is no other etiology of his hip pain.    The patient did have some question about Port-A-Cath.  It is a personal decision if he would like to proceed with the Port-A-Cath which would be reasonable if he wanted 1 since he is expected to undergo maintenance treatment as long as he is tolerating it okay and as long as he is not having any progression in his cancer.  The patient is not wanting to make a decision at this time.  He does not need to make a decision at this time and he could always let me know if he decides to move forward with this.  I will reach out to his nurse in the infusion room to see if they can show him the Port-A-Cath model today so he can get more information.    We will see him back for follow-up visit in 3 weeks.   He will continue to follow with GI. He  last saw them recently on 03/21/23. His bowel habits are improving.     The patient was advised to call immediately if he has any concerning symptoms in the interval. The patient voices understanding of current disease status and treatment options and is in agreement with the current care plan. All questions were answered. The patient knows to call the clinic with any problems, questions or concerns. We can certainly see the patient much sooner if necessary    Orders Placed This Encounter  Procedures   CT CHEST ABDOMEN PELVIS W CONTRAST    Standing Status:   Future    Expected Date:   04/30/2023    Expiration Date:   04/15/2024    If indicated for the ordered procedure, I authorize the administration of contrast media per Radiology protocol:   Yes    Does the patient have a contrast media/X-ray dye allergy?:   No    Preferred imaging location?:   [facility]    If indicated for the ordered procedure, I authorize the administration of oral contrast media per Radiology protocol:  Yes     The total time spent in the appointment was 20-29 minutes  Shonnie Poudrier L Seanpatrick Maisano, PA-C 04/16/23

## 2023-04-15 MED FILL — Fosaprepitant Dimeglumine For IV Infusion 150 MG (Base Eq): INTRAVENOUS | Qty: 5 | Status: AC

## 2023-04-16 ENCOUNTER — Ambulatory Visit: Payer: Medicare Other

## 2023-04-16 ENCOUNTER — Inpatient Hospital Stay: Payer: Medicare Other

## 2023-04-16 ENCOUNTER — Inpatient Hospital Stay: Payer: Medicare Other | Admitting: Physician Assistant

## 2023-04-16 ENCOUNTER — Other Ambulatory Visit: Payer: Self-pay | Admitting: Internal Medicine

## 2023-04-16 ENCOUNTER — Inpatient Hospital Stay (HOSPITAL_BASED_OUTPATIENT_CLINIC_OR_DEPARTMENT_OTHER): Payer: Medicare Other | Admitting: Nurse Practitioner

## 2023-04-16 ENCOUNTER — Encounter: Payer: Self-pay | Admitting: Nurse Practitioner

## 2023-04-16 VITALS — BP 135/79 | HR 90 | Temp 97.6°F | Resp 17 | Ht 69.0 in | Wt 135.3 lb

## 2023-04-16 DIAGNOSIS — C771 Secondary and unspecified malignant neoplasm of intrathoracic lymph nodes: Secondary | ICD-10-CM | POA: Diagnosis not present

## 2023-04-16 DIAGNOSIS — Z923 Personal history of irradiation: Secondary | ICD-10-CM | POA: Diagnosis not present

## 2023-04-16 DIAGNOSIS — Z515 Encounter for palliative care: Secondary | ICD-10-CM

## 2023-04-16 DIAGNOSIS — C3431 Malignant neoplasm of lower lobe, right bronchus or lung: Secondary | ICD-10-CM

## 2023-04-16 DIAGNOSIS — C49A5 Gastrointestinal stromal tumor of rectum: Secondary | ICD-10-CM | POA: Diagnosis not present

## 2023-04-16 DIAGNOSIS — Z5111 Encounter for antineoplastic chemotherapy: Secondary | ICD-10-CM | POA: Diagnosis not present

## 2023-04-16 DIAGNOSIS — C3490 Malignant neoplasm of unspecified part of unspecified bronchus or lung: Secondary | ICD-10-CM | POA: Diagnosis not present

## 2023-04-16 DIAGNOSIS — C3492 Malignant neoplasm of unspecified part of left bronchus or lung: Secondary | ICD-10-CM

## 2023-04-16 DIAGNOSIS — R197 Diarrhea, unspecified: Secondary | ICD-10-CM | POA: Diagnosis not present

## 2023-04-16 DIAGNOSIS — Z902 Acquired absence of lung [part of]: Secondary | ICD-10-CM | POA: Diagnosis not present

## 2023-04-16 DIAGNOSIS — Z9221 Personal history of antineoplastic chemotherapy: Secondary | ICD-10-CM | POA: Diagnosis not present

## 2023-04-16 DIAGNOSIS — G629 Polyneuropathy, unspecified: Secondary | ICD-10-CM | POA: Diagnosis not present

## 2023-04-16 DIAGNOSIS — C3412 Malignant neoplasm of upper lobe, left bronchus or lung: Secondary | ICD-10-CM | POA: Diagnosis not present

## 2023-04-16 LAB — CBC WITH DIFFERENTIAL (CANCER CENTER ONLY)
Abs Immature Granulocytes: 0.01 10*3/uL (ref 0.00–0.07)
Basophils Absolute: 0 10*3/uL (ref 0.0–0.1)
Basophils Relative: 0 %
Eosinophils Absolute: 0 10*3/uL (ref 0.0–0.5)
Eosinophils Relative: 1 %
HCT: 28.5 % — ABNORMAL LOW (ref 39.0–52.0)
Hemoglobin: 9.3 g/dL — ABNORMAL LOW (ref 13.0–17.0)
Immature Granulocytes: 0 %
Lymphocytes Relative: 22 %
Lymphs Abs: 0.6 10*3/uL — ABNORMAL LOW (ref 0.7–4.0)
MCH: 31.1 pg (ref 26.0–34.0)
MCHC: 32.6 g/dL (ref 30.0–36.0)
MCV: 95.3 fL (ref 80.0–100.0)
Monocytes Absolute: 0.3 10*3/uL (ref 0.1–1.0)
Monocytes Relative: 12 %
Neutro Abs: 1.8 10*3/uL (ref 1.7–7.7)
Neutrophils Relative %: 65 %
Platelet Count: 174 10*3/uL (ref 150–400)
RBC: 2.99 MIL/uL — ABNORMAL LOW (ref 4.22–5.81)
RDW: 16.6 % — ABNORMAL HIGH (ref 11.5–15.5)
WBC Count: 2.7 10*3/uL — ABNORMAL LOW (ref 4.0–10.5)
nRBC: 0 % (ref 0.0–0.2)

## 2023-04-16 LAB — CMP (CANCER CENTER ONLY)
ALT: 49 U/L — ABNORMAL HIGH (ref 0–44)
AST: 28 U/L (ref 15–41)
Albumin: 3.8 g/dL (ref 3.5–5.0)
Alkaline Phosphatase: 136 U/L — ABNORMAL HIGH (ref 38–126)
Anion gap: 5 (ref 5–15)
BUN: 21 mg/dL (ref 8–23)
CO2: 31 mmol/L (ref 22–32)
Calcium: 9.7 mg/dL (ref 8.9–10.3)
Chloride: 103 mmol/L (ref 98–111)
Creatinine: 0.71 mg/dL (ref 0.61–1.24)
GFR, Estimated: >60 mL/min (ref >60)
Glucose, Bld: 93 mg/dL (ref 70–99)
Potassium: 4.3 mmol/L (ref 3.5–5.1)
Sodium: 139 mmol/L (ref 135–145)
Total Bilirubin: 0.4 mg/dL (ref 0.0–1.2)
Total Protein: 7.3 g/dL (ref 6.5–8.1)

## 2023-04-16 MED ORDER — CYANOCOBALAMIN 1000 MCG/ML IJ SOLN
1000.0000 ug | Freq: Once | INTRAMUSCULAR | Status: AC
Start: 2023-04-16 — End: 2023-04-16
  Administered 2023-04-16: 1000 ug via INTRAMUSCULAR
  Filled 2023-04-16: qty 1

## 2023-04-16 MED ORDER — SODIUM CHLORIDE 0.9 % IV SOLN
422.5000 mg | Freq: Once | INTRAVENOUS | Status: AC
  Administered 2023-04-16: 420 mg via INTRAVENOUS
  Filled 2023-04-16: qty 42

## 2023-04-16 MED ORDER — SODIUM CHLORIDE 0.9 % IV SOLN
150.0000 mg | Freq: Once | INTRAVENOUS | Status: AC
  Administered 2023-04-16: 150 mg via INTRAVENOUS
  Filled 2023-04-16: qty 150

## 2023-04-16 MED ORDER — PALONOSETRON HCL INJECTION 0.25 MG/5ML
0.2500 mg | Freq: Once | INTRAVENOUS | Status: AC
  Administered 2023-04-16: 0.25 mg via INTRAVENOUS
  Filled 2023-04-16: qty 5

## 2023-04-16 MED ORDER — SODIUM CHLORIDE 0.9 % IV SOLN
500.0000 mg/m2 | Freq: Once | INTRAVENOUS | Status: AC
Start: 2023-04-16 — End: 2023-04-16
  Administered 2023-04-16: 900 mg via INTRAVENOUS
  Filled 2023-04-16: qty 20

## 2023-04-16 MED ORDER — SODIUM CHLORIDE 0.9 % IV SOLN: Freq: Once | INTRAVENOUS | Status: AC

## 2023-04-16 MED ORDER — DEXAMETHASONE SODIUM PHOSPHATE 10 MG/ML IJ SOLN
10.0000 mg | Freq: Once | INTRAMUSCULAR | Status: AC
  Administered 2023-04-16: 10 mg via INTRAVENOUS
  Filled 2023-04-16: qty 1

## 2023-04-16 NOTE — Patient Instructions (Signed)
 [facility] - A DEPT OF [facility]  Discharge Instructions: Thank you for choosing [facility] to provide your oncology and hematology care.   If you have a lab appointment with the Cancer Center, please go directly to the Cancer Center and check in at the registration area.   Wear comfortable clothing and clothing appropriate for easy access to any Portacath or PICC line.   We strive to give you quality time with your provider. You may need to reschedule your appointment if you arrive late (15 or more minutes).  Arriving late affects you and other patients whose appointments are after yours.  Also, if you miss three or more appointments without notifying the office, you may be dismissed from the clinic at the provider's discretion.      For prescription refill requests, have your pharmacy contact our office and allow 72 hours for refills to be completed.    Today you received the following chemotherapy and/or immunotherapy agents: Alimta/Carboplatin      To help prevent nausea and vomiting after your treatment, we encourage you to take your nausea medication as directed.  BELOW ARE SYMPTOMS THAT SHOULD BE REPORTED IMMEDIATELY: *FEVER GREATER THAN 100.4 F (38 C) OR HIGHER *CHILLS OR SWEATING *NAUSEA AND VOMITING THAT IS NOT CONTROLLED WITH YOUR NAUSEA MEDICATION *UNUSUAL SHORTNESS OF BREATH *UNUSUAL BRUISING OR BLEEDING *URINARY PROBLEMS (pain or burning when urinating, or frequent urination) *BOWEL PROBLEMS (unusual diarrhea, constipation, pain near the anus) TENDERNESS IN MOUTH AND THROAT WITH OR WITHOUT PRESENCE OF ULCERS (sore throat, sores in mouth, or a toothache) UNUSUAL RASH, SWELLING OR PAIN  UNUSUAL VAGINAL DISCHARGE OR ITCHING   Items with * indicate a potential emergency and should be followed up as soon as possible or go to the Emergency Department if any problems should occur.  Please show the CHEMOTHERAPY ALERT CARD or  IMMUNOTHERAPY ALERT CARD at check-in to the Emergency Department and triage nurse.  Should you have questions after your visit or need to cancel or reschedule your appointment, please contact [facility] - A DEPT OF [facility]  Dept: [phone]  and follow the prompts.  Office hours are 8:00 a.m. to 4:30 p.m. Monday - Friday. Please note that voicemails left after 4:00 p.m. may not be returned until the following business day.  We are closed weekends and major holidays. You have access to a nurse at all times for urgent questions. Please call the main number to the clinic Dept: [phone] and follow the prompts.   For any non-urgent questions, you may also contact your provider using MyChart. We now offer e-Visits for anyone 25 and older to request care online for non-urgent symptoms. For details visit [email or web address].   Also download the MyChart app! Go to the app store, search "MyChart", open the app, select [facility], and log in with your MyChart username and password.

## 2023-04-22 ENCOUNTER — Telehealth: Payer: Self-pay | Admitting: Internal Medicine

## 2023-04-22 ENCOUNTER — Encounter: Payer: Self-pay | Admitting: Internal Medicine

## 2023-04-22 NOTE — Telephone Encounter (Signed)
 Cannot come this week due to weather. [place] and does not expect he can get out this week.  He wants to start his lab on 04/30/23 when he gets lab for his CT. I cancelled appt this week.

## 2023-04-24 ENCOUNTER — Inpatient Hospital Stay: Payer: Medicare Other

## 2023-04-30 ENCOUNTER — Inpatient Hospital Stay: Payer: Medicare Other | Attending: Internal Medicine

## 2023-04-30 ENCOUNTER — Ambulatory Visit (HOSPITAL_COMMUNITY)
Admission: RE | Admit: 2023-04-30 | Discharge: 2023-04-30 | Disposition: A | Discharge status: Home / Self Care | Payer: Medicare Other | Source: Ambulatory Visit | Attending: Physician Assistant | Admitting: Physician Assistant

## 2023-04-30 DIAGNOSIS — C3412 Malignant neoplasm of upper lobe, left bronchus or lung: Secondary | ICD-10-CM | POA: Insufficient documentation

## 2023-04-30 DIAGNOSIS — C771 Secondary and unspecified malignant neoplasm of intrathoracic lymph nodes: Secondary | ICD-10-CM | POA: Insufficient documentation

## 2023-04-30 DIAGNOSIS — Z5111 Encounter for antineoplastic chemotherapy: Secondary | ICD-10-CM | POA: Insufficient documentation

## 2023-04-30 DIAGNOSIS — C3490 Malignant neoplasm of unspecified part of unspecified bronchus or lung: Secondary | ICD-10-CM | POA: Insufficient documentation

## 2023-04-30 DIAGNOSIS — Z902 Acquired absence of lung [part of]: Secondary | ICD-10-CM | POA: Diagnosis not present

## 2023-04-30 DIAGNOSIS — C3431 Malignant neoplasm of lower lobe, right bronchus or lung: Secondary | ICD-10-CM

## 2023-04-30 DIAGNOSIS — Z923 Personal history of irradiation: Secondary | ICD-10-CM | POA: Diagnosis not present

## 2023-04-30 DIAGNOSIS — J439 Emphysema, unspecified: Secondary | ICD-10-CM | POA: Diagnosis not present

## 2023-04-30 DIAGNOSIS — I700 Atherosclerosis of aorta: Secondary | ICD-10-CM | POA: Diagnosis not present

## 2023-04-30 LAB — CBC WITH DIFFERENTIAL (CANCER CENTER ONLY)
Abs Immature Granulocytes: 0.02 10*3/uL (ref 0.00–0.07)
Basophils Absolute: 0 10*3/uL (ref 0.0–0.1)
Basophils Relative: 0 %
Eosinophils Absolute: 0 10*3/uL (ref 0.0–0.5)
Eosinophils Relative: 0 %
HCT: 27.1 % — ABNORMAL LOW (ref 39.0–52.0)
Hemoglobin: 8.9 g/dL — ABNORMAL LOW (ref 13.0–17.0)
Immature Granulocytes: 1 %
Lymphocytes Relative: 22 %
Lymphs Abs: 0.6 10*3/uL — ABNORMAL LOW (ref 0.7–4.0)
MCH: 31.7 pg (ref 26.0–34.0)
MCHC: 32.8 g/dL (ref 30.0–36.0)
MCV: 96.4 fL (ref 80.0–100.0)
Monocytes Absolute: 0.4 10*3/uL (ref 0.1–1.0)
Monocytes Relative: 12 %
Neutro Abs: 2 10*3/uL (ref 1.7–7.7)
Neutrophils Relative %: 65 %
Platelet Count: 111 10*3/uL — ABNORMAL LOW (ref 150–400)
RBC: 2.81 MIL/uL — ABNORMAL LOW (ref 4.22–5.81)
RDW: 16.2 % — ABNORMAL HIGH (ref 11.5–15.5)
WBC Count: 3 10*3/uL — ABNORMAL LOW (ref 4.0–10.5)
nRBC: 0 % (ref 0.0–0.2)

## 2023-04-30 LAB — CMP (CANCER CENTER ONLY)
ALT: 37 U/L (ref 0–44)
AST: 26 U/L (ref 15–41)
Albumin: 4 g/dL (ref 3.5–5.0)
Alkaline Phosphatase: 135 U/L — ABNORMAL HIGH (ref 38–126)
Anion gap: 5 (ref 5–15)
BUN: 25 mg/dL — ABNORMAL HIGH (ref 8–23)
CO2: 31 mmol/L (ref 22–32)
Calcium: 9.8 mg/dL (ref 8.9–10.3)
Chloride: 105 mmol/L (ref 98–111)
Creatinine: 0.64 mg/dL (ref 0.61–1.24)
GFR, Estimated: >60 mL/min (ref >60)
Glucose, Bld: 92 mg/dL (ref 70–99)
Potassium: 3.8 mmol/L (ref 3.5–5.1)
Sodium: 141 mmol/L (ref 135–145)
Total Bilirubin: 0.4 mg/dL (ref 0.0–1.2)
Total Protein: 7.5 g/dL (ref 6.5–8.1)

## 2023-04-30 MED ORDER — IOHEXOL 300 MG/ML  SOLN
100.0000 mL | Freq: Once | INTRAMUSCULAR | Status: AC | PRN
  Administered 2023-04-30: 100 mL via INTRAVENOUS

## 2023-05-03 NOTE — Progress Notes (Unsigned)
[facility] OFFICE PROGRESS NOTE  [name], MD [address] [place] [address]  DIAGNOSIS: 1) recurrent non-small cell lung cancer, adenocarcinoma with positive mediastinal lymph node in December 2023 that was initially diagnosed as stage IIB (T1 a, N1, M0) non-small cell lung cancer, adenocarcinoma diagnosed in June 2021.  The patient also has evidence for recurrent disease in the mediastinal lymph node in January 2024 and in the right lower lobe in August 2024. 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   Biomarker Findings Tumor Mutational Burden - 20 Muts/Mb Microsatellite status - MS-Stable Genomic Findings For a complete list of the genes assayed, please refer to the Appendix. KEAP1 I471fs*17 DNMT3A E817* RB1 splice site 1960+5G>C TP53 R209* 8 Disease relevant genes with no reportable alterations: ALK, BRAF, EGFR, ERBB2, KRAS, MET, RET, ROS1   PDL1 Expression: 1 %  PRIOR THERAPY: 1) status post left upper lobectomy with lymph node dissection on December 16, 2019 under the care of Dr. Cliffton Asters.  The tumor size measured 0.9 cm but there was involvement of the level 10 L and 11 L.  2) Adjuvant systemic chemotherapy with cisplatin 75 mg/M2 and Alimta 500 mg/M2 every 3 weeks.  First dose February 04, 2020.  Status post 4 cycles. 3) Neoadjuvant treatment with imatinib 400 mg p.o. daily for the GIST tumor of the pelvic area.  First dose started July 18, 2020.  Status post 12 months of treatment. 4) status post robotic assisted very low anterior rectosigmoid resection with coloanal anastomosis, diverting loop ileostomy and wedge liver biopsy under the care of Dr. Michaell Cowing on December 14, 2021 and it showed minimal residual gastrointestinal stromal tumor. 5) concurrent chemoradiation with weekly carboplatin for AUC of 2 and paclitaxel 45 Mg/M2.  First dose May 14, 2022. 6) Imatinib 400 mg p.o. daily  CURRENT THERAPY: Systemic chemotherapy with  carboplatin for AUC of 5 and Alimta 500 Mg/M2 every 3 weeks.  First dose 12/25/2022.  Status post 6 cycles. Starting from cycle #7, he will start maintenance    INTERVAL HISTORY: BRYHEEM LIGOCKI 73 y.o. male returns to the clinic today for a follow-up visit accompanied by his wife. The patient was last seen by myself. The patient is currently undergoing treatment with carboplatin and Alimta for disease recurrence in the opposite lung which was found in August 2024.  He is status post 6 cycles and is tolerated it fair. He states that overall his last round of treatment was ***.  The patient has a good appetite but does struggle to gain weight.  However he has not lost any weight recently but continues to be cachectic appearing.  He previously had a feeding tube but has not been using this.  He does not follow with nutrition on a regular basis since he no longer has a feeding tube.  He denies any fever, chills, or night sweats. He does have cold intolerance at baseline. ***Last appointment endorsing arthritis in the hip He denies any chest pain, cough, or hemoptysis.  He may occasionally have mild dyspnea on exertion.  Denies any nausea or vomiting.  He is following closely with Dr. Leone Payor for his gastroenterology concerns.  He saw them last on 03/21/23.  The patient reports that he has frequent bowel movements but they are often hard and he does not produce a large volume of stool. Overall, this seems to be getting better.  He denies any headache or vision changes.  He is followed closely by palliative care  and is scheduled to see them on 05/28/23.  He recently had a restaging CT scan performed. He is here today for evaluation repeat blood work before undergoing cycle #7.      MEDICAL HISTORY: Past Medical History:  Diagnosis Date   Anemia    Cancer (HCC)    bladder cancer   COPD (chronic obstructive pulmonary disease) (HCC)    mild    DIVERTICULOSIS, COLON 01/14/2007   Qualifier: Diagnosis of  By:  Marcelyn Ditty RN, Katy Fitch    GASTRITIS, CHRONIC 06/01/2004   Qualifier: Diagnosis of  By: Creta Levin CMA (AAMA), Robin     GERD 01/09/2007   Qualifier: Diagnosis of  By: Tawanna Cooler, RN, Ellen     Heart murmur    as a child; no murmur heard 12/14/19   History of radiation therapy    Mediastinum- 05/15/22-06/28/22-Dr. Antony Blackbird   HYPERLIPIDEMIA 01/09/2007   Qualifier: Diagnosis of  By: Everett Graff     Hypertension    Lung mass    left upper nodule   Macular degeneration    right eye   NEOPLASM, MALIGNANT, BLADDER, HX OF 2002   Qualifier: Diagnosis of  By: Creta Levin CMA (AAMA), Robin  / no chemo or radiation   OSTEOARTHRITIS 01/14/2007   Qualifier: Diagnosis of  By: Marcelyn Ditty, RN, Katy Fitch - knees   Rectal mass    Reflux esophagitis 06/01/2004   Qualifier: Diagnosis of  By: Creta Levin CMA (AAMA), Robin     Stroke [facility])    TOBACCO USER 02/16/2009   Qualifier: Diagnosis of  By: Amador Cunas  MD, Janett Labella    VITAMIN D DEFICIENCY 06/03/2007   Qualifier: Diagnosis of  By: Amador Cunas  MD, Janett Labella     ALLERGIES:  has no known allergies.  MEDICATIONS:  Current Outpatient Medications  Medication Sig Dispense Refill   acetaminophen (TYLENOL) 500 MG tablet Take 1,000 mg by mouth every 8 (eight) hours as needed for moderate pain.     aspirin 81 MG chewable tablet Chew 1 tablet (81 mg total) by mouth daily.     atorvastatin (LIPITOR) 20 MG tablet Take 1 tablet (20 mg total) by mouth daily. 90 tablet 3   Cholecalciferol (VITAMIN D3 PO) Take 2,000 Units by mouth daily.     diphenoxylate-atropine (LOMOTIL) 2.5-0.025 MG tablet Take 2 tablets by mouth 3 (three) times daily as needed for diarrhea or loose stools. 90 tablet 0   ferrous sulfate 325 (65 FE) MG EC tablet Take 1 tablet (325 mg total) by mouth 2 (two) times daily. 60 tablet 1   folic acid (FOLVITE) 1 MG tablet TAKE 1 TABLET BY MOUTH EVERY DAY 90 tablet 1   lidocaine (XYLOCAINE) 5 % ointment Apply 1 Application topically 3 (three) times daily as  needed for mild pain or moderate pain. 35.44 g 3   loperamide (IMODIUM A-D) 2 MG tablet Take 4 mg by mouth 4 (four) times daily as needed for diarrhea or loose stools.     mirtazapine (REMERON SOL-TAB) 30 MG disintegrating tablet TAKE 1 TABLET BY MOUTH AT BEDTIME. 90 tablet 1   Multiple Vitamins-Minerals (PRESERVISION AREDS 2) CAPS Take 1 capsule by mouth 2 (two) times daily.     Nutritional Supplements (ENTERADE IBS-D PO) Take by mouth.     OVER THE COUNTER MEDICATION Take 1 tablet by mouth daily. PROBIOTICS     OVER THE COUNTER MEDICATION Fiber and spice pt take every morning     OVER THE COUNTER MEDICATION Juce pt takes every  morning     pantoprazole (PROTONIX) 40 MG tablet Take 1 tablet (40 mg total) by mouth 2 (two) times daily. 60 tablet 3   prochlorperazine (COMPAZINE) 10 MG tablet Take 1 tablet (10 mg total) by mouth every 6 (six) hours as needed for nausea or vomiting. 30 tablet 0   No current facility-administered medications for this visit.    SURGICAL HISTORY:  Past Surgical History:  Procedure Laterality Date   BIOPSY  06/18/2022   Procedure: BIOPSY;  Surgeon: Shellia Cleverly, DO;  Location: [facility];  Service: Gastroenterology;;   BREAST BIOPSY Left 03/16/2022   [place] LT RADIOACTIVE SEED LOC 03/16/2022 [facility]   BRONCHIAL BIOPSY  10/13/2019   Procedure: BRONCHIAL BIOPSIES;  Surgeon: Josephine Igo, DO;  [facility]: [facility];  Service: Pulmonary;;   BRONCHIAL BIOPSY  12/04/2022   Procedure: BRONCHIAL BIOPSIES;  Surgeon: Josephine Igo, DO;  [facility];  Service: Pulmonary;;   BRONCHIAL BRUSHINGS  10/13/2019   Procedure: BRONCHIAL BRUSHINGS;  Surgeon: Josephine Igo, DO;  [facility];  Service: Pulmonary;;   BRONCHIAL NEEDLE ASPIRATION BIOPSY  10/13/2019   Procedure: BRONCHIAL NEEDLE ASPIRATION BIOPSIES;  Surgeon: Josephine Igo, DO;  [facility];  Service: Pulmonary;;   BRONCHIAL NEEDLE ASPIRATION BIOPSY  04/11/2022    Procedure: BRONCHIAL NEEDLE ASPIRATION BIOPSIES;  Surgeon: Josephine Igo, DO;  [facility]: [facility];  Service: Cardiopulmonary;;   BRONCHIAL NEEDLE ASPIRATION BIOPSY  12/04/2022   Procedure: BRONCHIAL NEEDLE ASPIRATION BIOPSIES;  Surgeon: Josephine Igo, DO;  [facility];  Service: Pulmonary;;   BRONCHIAL WASHINGS  10/13/2019   Procedure: BRONCHIAL WASHINGS;  Surgeon: Josephine Igo, DO;  [facility];  Service: Pulmonary;;   BRONCHIAL WASHINGS  12/04/2022   Procedure: BRONCHIAL WASHINGS;  Surgeon: Josephine Igo, DO;  [facility];  Service: Pulmonary;;   COLONOSCOPY     multiple   DIVERTING ILEOSTOMY N/A 12/14/2021   Procedure: DIVERTING ILEOSTOMY;  Surgeon: Karie Soda, MD;  Location: [facility];  Service: General;  Laterality: N/A;   ENDOBRONCHIAL ULTRASOUND Bilateral 04/11/2022   Procedure: ENDOBRONCHIAL ULTRASOUND;  Surgeon: Josephine Igo, DO;  [facility];  Service: Cardiopulmonary;  Laterality: Bilateral;   ESOPHAGEAL DILATION  06/18/2022   Procedure: ESOPHAGEAL DILATION;  Surgeon: Shellia Cleverly, DO;  [facility];  Service: Gastroenterology;;   ESOPHAGOGASTRODUODENOSCOPY (EGD) WITH PROPOFOL N/A 06/18/2022   Procedure: ESOPHAGOGASTRODUODENOSCOPY (EGD) WITH PROPOFOL;  Surgeon: Shellia Cleverly, DO;  [facility]: [facility];  Service: Gastroenterology;  Laterality: N/A;  Severe dysphagia/odynophagia ?radiation esophagitis versus oral candidiasis   FIDUCIAL MARKER PLACEMENT  12/04/2022   Procedure: FIDUCIAL MARKER PLACEMENT;  Surgeon: Josephine Igo, DO;  [facility];  Service: Pulmonary;;   ILEOSTOMY CLOSURE N/A 03/22/2022   Procedure: OPEN TAKEDOWN OF LOOP ILEOSTOMY;  Surgeon: Karie Soda, MD;  Location: [facility];  Service: General;  Laterality: N/A;  GEN w/ERAS PATHWAY LOCAL   INTERCOSTAL NERVE BLOCK Left 12/16/2019   Procedure: INTERCOSTAL NERVE BLOCK;  Surgeon: Corliss Skains, MD;  [facility]: [facility];  Service:  Thoracic;  Laterality: Left;   IR FLUORO RM 30-60 MIN  07/02/2022   KNEE SURGERY  07/20/2008   bilat.   NASAL SEPTUM SURGERY  1995   NODE DISSECTION Left 12/16/2019   Procedure: NODE DISSECTION;  Surgeon: Corliss Skains, MD;  [facility];  Service: Thoracic;  Laterality: Left;   PEG PLACEMENT N/A 07/04/2022   Procedure: PERCUTANEOUS ENDOSCOPIC GASTROSTOMY (PEG) PLACEMENT;  Surgeon:  Iva Boop, MD;  [facility];  Service: Gastroenterology;  Laterality: N/A;   RADIOACTIVE SEED GUIDED AXILLARY SENTINEL LYMPH NODE Left 03/20/2022   Procedure: RADIOACTIVE SEED GUIDED LEFT AXILLARY SENTINEL LYMPH NODE BIOPSY;  Surgeon: Harriette Bouillon, MD;  [facility]: [facility];  Service: General;  Laterality: Left;   RECTAL EXAM UNDER ANESTHESIA N/A 03/22/2022   Procedure: ANORECTAL EXAMINATION UNDER ANESTHESIA;  Surgeon: Karie Soda, MD;  Location: [facility];  Service: General;  Laterality: N/A;   TONSILLECTOMY     TRANSURETHRAL RESECTION OF BLADDER TUMOR  2002   UPPER GASTROINTESTINAL ENDOSCOPY     VIDEO BRONCHOSCOPY  04/11/2022   Procedure: VIDEO BRONCHOSCOPY;  Surgeon: Josephine Igo, DO;  [facility]: [facility];  Service: Cardiopulmonary;;   VIDEO BRONCHOSCOPY WITH ENDOBRONCHIAL NAVIGATION N/A 10/13/2019   Procedure: VIDEO BRONCHOSCOPY WITH ENDOBRONCHIAL NAVIGATION;  Surgeon: Josephine Igo, DO;  [facility];  Service: Pulmonary;  Laterality: N/A;   WISDOM TOOTH EXTRACTION     XI ROBOTIC ASSISTED LOWER ANTERIOR RESECTION N/A 12/14/2021   Procedure: XI ROBOTIC ASSISTED LOWER ANTERIOR ULTRA LOW RECTOSIGMOID RESECTION, COLOANAL HAND SEWN ANASTOMOSIS, AND BILATERAL TAP BLOCK;  Surgeon: Karie Soda, MD;  Location: [facility];  Service: General;  Laterality: N/A;    REVIEW OF SYSTEMS:   Review of Systems  Constitutional: Negative for appetite change, chills, fatigue, fever and unexpected weight change.  HENT:   Negative for mouth sores, nosebleeds, sore throat and trouble swallowing.   Eyes:  Negative for eye problems and icterus.  Respiratory: Negative for cough, hemoptysis, shortness of breath and wheezing.   Cardiovascular: Negative for chest pain and leg swelling.  Gastrointestinal: Negative for abdominal pain, constipation, diarrhea, nausea and vomiting.  Genitourinary: Negative for bladder incontinence, difficulty urinating, dysuria, frequency and hematuria.   Musculoskeletal: Negative for back pain, gait problem, neck pain and neck stiffness.  Skin: Negative for itching and rash.  Neurological: Negative for dizziness, extremity weakness, gait problem, headaches, light-headedness and seizures.  Hematological: Negative for adenopathy. Does not bruise/bleed easily.  Psychiatric/Behavioral: Negative for confusion, depression and sleep disturbance. The patient is not nervous/anxious.     PHYSICAL EXAMINATION:  There were no vitals taken for this visit.  ECOG PERFORMANCE STATUS: {CHL ONC ECOG [id number]  Physical Exam  Constitutional: Oriented to person, place, and time and well-developed, well-nourished, and in no distress. No distress.  HENT:  Head: Normocephalic and atraumatic.  Mouth/Throat: Oropharynx is clear and moist. No oropharyngeal exudate.  Eyes: Conjunctivae are normal. Right eye exhibits no discharge. Left eye exhibits no discharge. No scleral icterus.  Neck: Normal range of motion. Neck supple.  Cardiovascular: Normal rate, regular rhythm, normal heart sounds and intact distal pulses.   Pulmonary/Chest: Effort normal and breath sounds normal. No respiratory distress. No wheezes. No rales.  Abdominal: Soft. Bowel sounds are normal. Exhibits no distension and no mass. There is no tenderness.  Musculoskeletal: Normal range of motion. Exhibits no edema.  Lymphadenopathy:    No cervical adenopathy.  Neurological: Alert and oriented to person, place, and time. Exhibits normal muscle tone. Gait normal. Coordination normal.  Skin: Skin is warm and dry. No rash  noted. Not diaphoretic. No erythema. No pallor.  Psychiatric: Mood, memory and judgment normal.  Vitals reviewed.  LABORATORY DATA: Lab Results  Component Value Date   WBC 3.0 (L) 04/30/2023   HGB 8.9 (L) 04/30/2023   HCT 27.1 (L) 04/30/2023   MCV 96.4 04/30/2023   PLT 111 (L) 04/30/2023      Chemistry  Component Value Date/Time   NA 141 04/30/2023 1322   K 3.8 04/30/2023 1322   CL 105 04/30/2023 1322   CO2 31 04/30/2023 1322   BUN 25 (H) 04/30/2023 1322   CREATININE 0.64 04/30/2023 1322      Component Value Date/Time   CALCIUM 9.8 04/30/2023 1322   ALKPHOS 135 (H) 04/30/2023 1322   AST 26 04/30/2023 1322   ALT 37 04/30/2023 1322   BILITOT 0.4 04/30/2023 1322       RADIOGRAPHIC STUDIES:  CT CHEST ABDOMEN PELVIS W CONTRAST Result Date: 04/30/2023 CLINICAL DATA:  Metastatic non-small cell lung cancer restaging * Tracking Code: BO * EXAM: CT CHEST, ABDOMEN, AND PELVIS WITH CONTRAST TECHNIQUE: Multidetector CT imaging of the chest, abdomen and pelvis was performed following the standard protocol during bolus administration of intravenous contrast. RADIATION DOSE REDUCTION: This exam was performed according to the departmental dose-optimization program which includes automated exposure control, adjustment of the mA and/or kV according to patient size and/or use of iterative reconstruction technique. CONTRAST:  OMNIPAQUE IOHEXOL 300 MG/ML  SOLN COMPARISON:  02/26/2023 FINDINGS: CT CHEST FINDINGS Cardiovascular: Aortic atherosclerosis. Normal heart size. Three-vessel coronary artery calcifications. No pericardial effusion. Mediastinum/Nodes: No enlarged mediastinal, hilar, or axillary lymph nodes. Thyroid gland, trachea, and esophagus demonstrate no significant findings. Lungs/Pleura: Status post left upper lobectomy. Unchanged postoperative and post treatment appearance of the chest status post left upper lobectomy with paramedian radiation fibrosis. Multiple small pulmonary  nodules not significantly changed, for example a 0.6 cm nodule of the right apex (series 4, image 25) and a 1.0 cm nodule of the anterior right upper lobe (series 4, image 39). Small, loculated left pleural effusion. Interval resolution of previously seen left pneumothorax component. Mild centrilobular emphysema. Musculoskeletal: No chest wall abnormality. No acute osseous findings. CT ABDOMEN PELVIS FINDINGS Hepatobiliary: No solid liver abnormality is seen. No gallstones, gallbladder wall thickening, or biliary dilatation. Pancreas: Unremarkable. No pancreatic ductal dilatation or surrounding inflammatory changes. Spleen: Normal in size without significant abnormality. Adrenals/Urinary Tract: Unchanged left adrenal nodule measuring 1.4 x 1.4 cm (series 2, image 61). Nonobstructive calculus of the midportion of the right kidney. No left-sided calculi, ureteral calculi, or hydronephrosis. Bladder is unremarkable. Stomach/Bowel: Percutaneous gastrostomy. Appendix appears normal. No evidence of bowel wall thickening, distention, or inflammatory changes. Vascular/Lymphatic: Severe aortic atherosclerosis. No enlarged abdominal or pelvic lymph nodes. Reproductive: Prostatomegaly. Other: No abdominal wall hernia or abnormality. No ascites. Musculoskeletal: No acute osseous findings. IMPRESSION: 1. Unchanged postoperative and post treatment appearance of the chest status post left upper lobectomy with paramedian radiation fibrosis. 2. Multiple small pulmonary nodules not significantly changed. 3. Small, loculated left pleural effusion. Interval resolution of previously seen left pneumothorax component. 4. Unchanged left adrenal nodule, nonspecific but statistically most likely an adenoma. Attention on follow-up. 5. No evidence of lymphadenopathy or metastatic disease in the abdomen or pelvis. 6. Emphysema. 7. Coronary artery disease. Aortic Atherosclerosis (ICD10-I70.0) and Emphysema (ICD10-J43.9). Electronically Signed    By: Jearld Lesch M.D.   On: 04/30/2023 21:57     ASSESSMENT/PLAN:  This is a very pleasant 73 year old Caucasian male diagnosed with recurrent disease but he was initially diagnosed as stage IIIb (T1a, N1, M0) non-small cell lung cancer, adenocarcinoma.  He was initially diagnosed in June 2021 and is status post left upper lobectomy with lymph node dissection on 12/16/2019 to the care of Dr. Cliffton Asters.  The patient has no actionable mutations and his PD-L1 expression is 1%.    For his lung cancer, the  patient initially completed adjuvant systemic chemotherapy with cisplatin 75 mg/m and Alimta 500 mg/m IV every 3 weeks status post 4 cycles.    For the pelvic GIST tumor, the patient completed neoadjuvant treatment with imatinib 400 mg p.o. daily in April 2023.  He patient underwent surgical resection of the remaining gastrointestinal stromal tumor under the care of Dr. Michaell Cowing on December 14, 2021 with very minimal residual disease.     In November 2023, his restaging CT scan for his lung cancer showed increased soft tissue density in the left hilar region concerning for possible developing lymphadenopathy or disease recurrence.  A PET scan was performed and showed new hypermetabolic left axillary, mediastinal, and left hilar adenopathy compatible with disease recurrence.   He underwent excisional biopsy of left axillary lymph node which was not conclusive of malignancy.   Therefore Dr. Tonia Brooms performed a repeat video bronchoscopy with EBUS on 04/11/2022 and the biopsy from the station 7 lymph node showed recurrent adenocarcinoma.   Therefore the patient is currently undergoing concurrent chemoradiation with carboplatin for an AUC of 2 and paclitaxel 45 mg/m. His first dose was on 05/14/2022.    Repeat PET scan showed no concerning findings for disease progression except for suspicious slowly growing adenocarcinoma in the right lower lobe but inflammatory changes in the right lung as well as suspicious  reactive mediastinal lymphadenopathy.  The patient had repeat bronchoscopy by Dr. Tonia Brooms and the final pathology was consistent with recurrent non-small cell lung cancer. The patient started systemic chemotherapy with carboplatin for AUC of 5 and Alimta 500 Mg/M2 on 12/25/2022.  Status post 6 cycles.  The patient has been tolerating his treatment fairly well.  The patient was seen with Dr. Arbutus Ped today.  Dr. Arbutus Ped personally and independently reviewed the scan and discussed results with the patient today.  The scan showed ***.  Dr. Arbutus Ped recommends ***    Per Dr. Arbutus Ped, the patient would receive 4-6 cycles of carboplatin and Alimta.  This to be followed by maintenance Alimta IV every 3 weeks unless there is evidence of disease progression or unacceptable toxicity.   I previously discussed a port with the patient but he declined but would let us know if he changes his mind.   We will see him back for follow-up visit in 3 weeks   He will continue to follow with GI. He last saw them recently on 03/21/23. His bowel habits are improving.    The patient was advised to call immediately if he has any concerning symptoms in the interval. The patient voices understanding of current disease status and treatment options and is in agreement with the current care plan. All questions were answered. The patient knows to call the clinic with any problems, questions or concerns. We can certainly see the patient much sooner if necessary      No orders of the defined types were placed in this encounter.    I spent {CHL ONC TIME VISIT - XBJYN:[id number]} counseling the patient face to face. The total time spent in the appointment was {CHL ONC TIME VISIT - MVHQI:[id number]}.  Heydy Montilla L Jaylon Boylen, PA-C 05/03/23

## 2023-05-04 ENCOUNTER — Ambulatory Visit: Payer: Medicare Other

## 2023-05-07 ENCOUNTER — Inpatient Hospital Stay: Payer: Medicare Other

## 2023-05-07 ENCOUNTER — Inpatient Hospital Stay: Payer: Medicare Other | Admitting: Physician Assistant

## 2023-05-07 VITALS — BP 114/79 | HR 98 | Temp 98.8°F | Resp 18 | Ht 69.0 in | Wt 136.6 lb

## 2023-05-07 DIAGNOSIS — C3431 Malignant neoplasm of lower lobe, right bronchus or lung: Secondary | ICD-10-CM

## 2023-05-07 DIAGNOSIS — Z923 Personal history of irradiation: Secondary | ICD-10-CM | POA: Diagnosis not present

## 2023-05-07 DIAGNOSIS — C3490 Malignant neoplasm of unspecified part of unspecified bronchus or lung: Secondary | ICD-10-CM

## 2023-05-07 DIAGNOSIS — C3412 Malignant neoplasm of upper lobe, left bronchus or lung: Secondary | ICD-10-CM | POA: Diagnosis not present

## 2023-05-07 DIAGNOSIS — Z5111 Encounter for antineoplastic chemotherapy: Secondary | ICD-10-CM | POA: Diagnosis not present

## 2023-05-07 DIAGNOSIS — C3492 Malignant neoplasm of unspecified part of left bronchus or lung: Secondary | ICD-10-CM

## 2023-05-07 DIAGNOSIS — Z902 Acquired absence of lung [part of]: Secondary | ICD-10-CM | POA: Diagnosis not present

## 2023-05-07 DIAGNOSIS — C771 Secondary and unspecified malignant neoplasm of intrathoracic lymph nodes: Secondary | ICD-10-CM | POA: Diagnosis not present

## 2023-05-07 LAB — CMP (CANCER CENTER ONLY)
ALT: 25 U/L (ref 0–44)
AST: 24 U/L (ref 15–41)
Albumin: 3.9 g/dL (ref 3.5–5.0)
Alkaline Phosphatase: 129 U/L — ABNORMAL HIGH (ref 38–126)
Anion gap: 7 (ref 5–15)
BUN: 29 mg/dL — ABNORMAL HIGH (ref 8–23)
CO2: 29 mmol/L (ref 22–32)
Calcium: 9.7 mg/dL (ref 8.9–10.3)
Chloride: 103 mmol/L (ref 98–111)
Creatinine: 0.69 mg/dL (ref 0.61–1.24)
GFR, Estimated: >60 mL/min (ref >60)
Glucose, Bld: 92 mg/dL (ref 70–99)
Potassium: 3.6 mmol/L (ref 3.5–5.1)
Sodium: 139 mmol/L (ref 135–145)
Total Bilirubin: 0.3 mg/dL (ref 0.0–1.2)
Total Protein: 7.6 g/dL (ref 6.5–8.1)

## 2023-05-07 LAB — CBC WITH DIFFERENTIAL (CANCER CENTER ONLY)
Abs Immature Granulocytes: 0.01 10*3/uL (ref 0.00–0.07)
Basophils Absolute: 0 10*3/uL (ref 0.0–0.1)
Basophils Relative: 0 %
Eosinophils Absolute: 0 10*3/uL (ref 0.0–0.5)
Eosinophils Relative: 1 %
HCT: 31.2 % — ABNORMAL LOW (ref 39.0–52.0)
Hemoglobin: 9.9 g/dL — ABNORMAL LOW (ref 13.0–17.0)
Immature Granulocytes: 0 %
Lymphocytes Relative: 20 %
Lymphs Abs: 0.7 10*3/uL (ref 0.7–4.0)
MCH: 31.7 pg (ref 26.0–34.0)
MCHC: 31.7 g/dL (ref 30.0–36.0)
MCV: 100 fL (ref 80.0–100.0)
Monocytes Absolute: 0.4 10*3/uL (ref 0.1–1.0)
Monocytes Relative: 10 %
Neutro Abs: 2.3 10*3/uL (ref 1.7–7.7)
Neutrophils Relative %: 69 %
Platelet Count: 223 10*3/uL (ref 150–400)
RBC: 3.12 MIL/uL — ABNORMAL LOW (ref 4.22–5.81)
RDW: 17.2 % — ABNORMAL HIGH (ref 11.5–15.5)
WBC Count: 3.4 10*3/uL — ABNORMAL LOW (ref 4.0–10.5)
nRBC: 0 % (ref 0.0–0.2)

## 2023-05-07 MED ORDER — SODIUM CHLORIDE 0.9 % IV SOLN
500.0000 mg/m2 | Freq: Once | INTRAVENOUS | Status: AC
  Administered 2023-05-07: 900 mg via INTRAVENOUS
  Filled 2023-05-07: qty 20

## 2023-05-07 MED ORDER — PROCHLORPERAZINE MALEATE 10 MG PO TABS
10.0000 mg | ORAL_TABLET | Freq: Once | ORAL | Status: AC
  Administered 2023-05-07: 10 mg via ORAL
  Filled 2023-05-07: qty 1

## 2023-05-07 MED ORDER — SODIUM CHLORIDE 0.9 % IV SOLN: INTRAVENOUS | Status: DC

## 2023-05-07 MED ORDER — LIDOCAINE-PRILOCAINE 2.5-2.5 % EX CREA: 1.0000 | TOPICAL_CREAM | CUTANEOUS | 2 refills | Status: DC | PRN

## 2023-05-07 MED ORDER — DEXAMETHASONE 4 MG PO TABS: ORAL_TABLET | ORAL | 0 refills | Status: DC

## 2023-05-07 NOTE — Patient Instructions (Signed)
 [facility] - A DEPT OF [facility]  Discharge Instructions: Thank you for choosing [facility] to provide your oncology and hematology care.   If you have a lab appointment with the Cancer Center, please go directly to the Cancer Center and check in at the registration area.   Wear comfortable clothing and clothing appropriate for easy access to any Portacath or PICC line.   We strive to give you quality time with your provider. You may need to reschedule your appointment if you arrive late (15 or more minutes).  Arriving late affects you and other patients whose appointments are after yours.  Also, if you miss three or more appointments without notifying the office, you may be dismissed from the clinic at the provider's discretion.      For prescription refill requests, have your pharmacy contact our office and allow 72 hours for refills to be completed.    Today you received the following chemotherapy and/or immunotherapy agents: Alimta      To help prevent nausea and vomiting after your treatment, we encourage you to take your nausea medication as directed.  BELOW ARE SYMPTOMS THAT SHOULD BE REPORTED IMMEDIATELY: *FEVER GREATER THAN 100.4 F (38 C) OR HIGHER *CHILLS OR SWEATING *NAUSEA AND VOMITING THAT IS NOT CONTROLLED WITH YOUR NAUSEA MEDICATION *UNUSUAL SHORTNESS OF BREATH *UNUSUAL BRUISING OR BLEEDING *URINARY PROBLEMS (pain or burning when urinating, or frequent urination) *BOWEL PROBLEMS (unusual diarrhea, constipation, pain near the anus) TENDERNESS IN MOUTH AND THROAT WITH OR WITHOUT PRESENCE OF ULCERS (sore throat, sores in mouth, or a toothache) UNUSUAL RASH, SWELLING OR PAIN  UNUSUAL VAGINAL DISCHARGE OR ITCHING   Items with * indicate a potential emergency and should be followed up as soon as possible or go to the Emergency Department if any problems should occur.  Please show the CHEMOTHERAPY ALERT CARD or IMMUNOTHERAPY  ALERT CARD at check-in to the Emergency Department and triage nurse.  Should you have questions after your visit or need to cancel or reschedule your appointment, please contact [facility] - A DEPT OF [facility]  Dept: [phone]  and follow the prompts.  Office hours are 8:00 a.m. to 4:30 p.m. Monday - Friday. Please note that voicemails left after 4:00 p.m. may not be returned until the following business day.  We are closed weekends and major holidays. You have access to a nurse at all times for urgent questions. Please call the main number to the clinic Dept: [phone] and follow the prompts.   For any non-urgent questions, you may also contact your provider using MyChart. We now offer e-Visits for anyone 22 and older to request care online for non-urgent symptoms. For details visit [email or web address].   Also download the MyChart app! Go to the app store, search "MyChart", open the app, select [place], and log in with your MyChart username and password.

## 2023-05-08 ENCOUNTER — Telehealth: Payer: Self-pay | Admitting: Internal Medicine

## 2023-05-08 ENCOUNTER — Ambulatory Visit: Payer: Medicare Other | Admitting: Internal Medicine

## 2023-05-08 NOTE — Telephone Encounter (Signed)
Please see note below and advise  

## 2023-05-08 NOTE — Telephone Encounter (Signed)
Inbound call from patient, states he would like to get his feeding tube removed before the end of the March. Patient had an appointment for today scheduled but due to the weather had to cancel. He states he would just like to talk to Dr. Leone Payor, and get it removed. Please advise.

## 2023-05-08 NOTE — Telephone Encounter (Signed)
Pt made aware of recent results and Dr. Leone Payor recommendations: Pt was scheduled for 05/13/2023 at 3:10 PM. Pt made aware. Pt verbalized understanding with all questions answered.

## 2023-05-08 NOTE — Telephone Encounter (Signed)
He can be rescheduled in an office visit and I will remove it  OK to use a banding slot

## 2023-05-13 ENCOUNTER — Telehealth: Payer: Self-pay | Admitting: Medical Oncology

## 2023-05-13 ENCOUNTER — Ambulatory Visit: Payer: Medicare Other | Admitting: Internal Medicine

## 2023-05-13 ENCOUNTER — Encounter: Payer: Self-pay | Admitting: Internal Medicine

## 2023-05-13 VITALS — BP 118/68 | HR 103 | Ht 71.0 in | Wt 134.0 lb

## 2023-05-13 DIAGNOSIS — K4090 Unilateral inguinal hernia, without obstruction or gangrene, not specified as recurrent: Secondary | ICD-10-CM

## 2023-05-13 DIAGNOSIS — Z931 Gastrostomy status: Secondary | ICD-10-CM | POA: Diagnosis not present

## 2023-05-13 NOTE — Progress Notes (Signed)
Jesse [name] 72 y.o. April 30, 1950 [id number]  Assessment & Plan:   Encounter Diagnoses  Name Primary?   Left inguinal hernia Yes   PEG (percutaneous endoscopic gastrostomy) status (HCC)    Reviewed complications of left inguinal hernia he will monitor that at this time.  Handout provided.  Gastrostomy tube was removed.  Tolerated well.  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  He is getting more therapy for lung cancer as it has recurred, but he is swallowing well and not using the gastrostomy tube for over 6 months and would like it removed.  Dr. Shirline Frees, his oncologist has signed off on this as well.  The patient also wonders if he has a left-sided hernia as there is a bulge down in the groin that is developed in the last couple of weeks.  Sometimes he has been straining to stool and it will pop out but it is not tender or painful at all. No Known Allergies Current Meds  Medication Sig   acetaminophen (TYLENOL) 500 MG tablet Take 1,000 mg by mouth every 8 (eight) hours as needed for moderate pain.   aspirin 81 MG chewable tablet Chew 1 tablet (81 mg total) by mouth daily.   atorvastatin (LIPITOR) 20 MG tablet Take 1 tablet (20 mg total) by mouth daily.   Cholecalciferol (VITAMIN D3 PO) Take 2,000 Units by mouth daily.   dexamethasone (DECADRON) 4 MG tablet Please take 1 tablet twice a day the day before, the day of, and the day after chemotherapy   diphenoxylate-atropine (LOMOTIL) 2.5-0.025 MG tablet Take 2 tablets by mouth 3 (three) times daily as needed for diarrhea or loose stools.   ferrous sulfate 325 (65 FE) MG EC tablet Take  1 tablet (325 mg total) by mouth 2 (two) times daily.   folic acid (FOLVITE) 1 MG tablet TAKE 1 TABLET BY MOUTH EVERY DAY   lidocaine (XYLOCAINE) 5 % ointment Apply 1 Application topically 3 (three) times daily as needed for mild pain or moderate pain.   lidocaine-prilocaine (EMLA) cream Apply 1 Application topically as needed.   loperamide (IMODIUM A-D) 2 MG tablet Take 4 mg by mouth 4 (four) times daily as needed for diarrhea or loose stools.   mirtazapine (REMERON SOL-TAB) 30 MG disintegrating tablet TAKE 1 TABLET BY MOUTH AT BEDTIME.   Multiple Vitamins-Minerals (PRESERVISION AREDS 2) CAPS Take 1 capsule by mouth 2 (two) times daily.   Nutritional Supplements (ENTERADE IBS-D PO) Take by mouth.   OVER THE COUNTER MEDICATION Take 1 tablet by mouth daily. PROBIOTICS   OVER THE COUNTER MEDICATION Fiber and spice pt take every morning   OVER THE COUNTER MEDICATION Juce pt takes every morning   pantoprazole (PROTONIX) 40 MG tablet Take 1 tablet (40 mg total) by mouth 2 (two) times daily.   prochlorperazine (COMPAZINE) 10 MG tablet Take 1 tablet (10 mg total) by mouth every 6 (six) hours as needed for nausea or vomiting.   Past Medical History:  Diagnosis Date   Anemia    Cancer (HCC)    bladder cancer   COPD (chronic obstructive pulmonary disease) (HCC)    mild    DIVERTICULOSIS, COLON 01/14/2007  Qualifier: Diagnosis of  By: Marcelyn Ditty RN, Katy Fitch    GASTRITIS, CHRONIC 06/01/2004   Qualifier: Diagnosis of  By: Creta Levin CMA (AAMA), Robin     GERD 01/09/2007   Qualifier: Diagnosis of  By: Tawanna Cooler, RN, Ellen     Heart murmur    as a child; no murmur heard 12/14/19   History of radiation therapy    Mediastinum- 05/15/22-06/28/22-Dr. Antony Blackbird   HYPERLIPIDEMIA 01/09/2007   Qualifier: Diagnosis of  By: Tawanna Cooler RN, Alvino Chapel     Hypertension    Lung mass    left upper nodule   Macular degeneration    right eye   NEOPLASM, MALIGNANT, BLADDER, HX OF 2002   Qualifier: Diagnosis of  By: Creta Levin  CMA (AAMA), Robin  / no chemo or radiation   OSTEOARTHRITIS 01/14/2007   Qualifier: Diagnosis of  By: Marcelyn Ditty, RN, Katy Fitch - knees   Rectal mass    Reflux esophagitis 06/01/2004   Qualifier: Diagnosis of  By: Creta Levin CMA (AAMA), Robin     Stroke [facility])    TOBACCO USER 02/16/2009   Qualifier: Diagnosis of  By: Amador Cunas  MD, Janett Labella    VITAMIN D DEFICIENCY 06/03/2007   Qualifier: Diagnosis of  By: Amador Cunas  MD, Janett Labella    Past Surgical History:  Procedure Laterality Date   BIOPSY  06/18/2022   Procedure: BIOPSY;  Surgeon: Shellia Cleverly, DO;  [facility];  Service: Gastroenterology;;   BREAST BIOPSY Left 03/16/2022   [place] LT RADIOACTIVE SEED LOC 03/16/2022 [facility]   BRONCHIAL BIOPSY  10/13/2019   Procedure: BRONCHIAL BIOPSIES;  Surgeon: Josephine Igo, DO;  [facility];  Service: Pulmonary;;   BRONCHIAL BIOPSY  12/04/2022   Procedure: BRONCHIAL BIOPSIES;  Surgeon: Josephine Igo, DO;  [facility];  Service: Pulmonary;;   BRONCHIAL BRUSHINGS  10/13/2019   Procedure: BRONCHIAL BRUSHINGS;  Surgeon: Josephine Igo, DO;  [facility];  Service: Pulmonary;;   BRONCHIAL NEEDLE ASPIRATION BIOPSY  10/13/2019   Procedure: BRONCHIAL NEEDLE ASPIRATION BIOPSIES;  Surgeon: Josephine Igo, DO;  [facility];  Service: Pulmonary;;   BRONCHIAL NEEDLE ASPIRATION BIOPSY  04/11/2022   Procedure: BRONCHIAL NEEDLE ASPIRATION BIOPSIES;  Surgeon: Josephine Igo, DO;  [facility];  Service: Cardiopulmonary;;   BRONCHIAL NEEDLE ASPIRATION BIOPSY  12/04/2022   Procedure: BRONCHIAL NEEDLE ASPIRATION BIOPSIES;  Surgeon: Josephine Igo, DO;  [facility];  Service: Pulmonary;;   BRONCHIAL WASHINGS  10/13/2019   Procedure: BRONCHIAL WASHINGS;  Surgeon: Josephine Igo, DO;  [facility];  Service: Pulmonary;;   BRONCHIAL WASHINGS  12/04/2022   Procedure: BRONCHIAL WASHINGS;  Surgeon: Josephine Igo, DO;   [facility];  Service: Pulmonary;;   COLONOSCOPY     multiple   DIVERTING ILEOSTOMY N/A 12/14/2021   Procedure: DIVERTING ILEOSTOMY;  Surgeon: Karie Soda, MD;  [facility];  Service: General;  Laterality: N/A;   ENDOBRONCHIAL ULTRASOUND Bilateral 04/11/2022   Procedure: ENDOBRONCHIAL ULTRASOUND;  Surgeon: Josephine Igo, DO;  [facility];  Service: Cardiopulmonary;  Laterality: Bilateral;   ESOPHAGEAL DILATION  06/18/2022   Procedure: ESOPHAGEAL DILATION;  Surgeon: Shellia Cleverly, DO;  [facility];  Service: Gastroenterology;;   ESOPHAGOGASTRODUODENOSCOPY (EGD) WITH PROPOFOL N/A 06/18/2022   Procedure: ESOPHAGOGASTRODUODENOSCOPY (EGD) WITH PROPOFOL;  Surgeon: Shellia Cleverly, DO;  [facility];  Service: Gastroenterology;  Laterality: N/A;  Severe dysphagia/odynophagia ?radiation esophagitis versus oral candidiasis   FIDUCIAL MARKER PLACEMENT  12/04/2022   Procedure: FIDUCIAL MARKER PLACEMENT;  Surgeon: Josephine Igo, DO;  [facility];  Service: Pulmonary;;   ILEOSTOMY CLOSURE N/A 03/22/2022   Procedure: OPEN TAKEDOWN OF LOOP ILEOSTOMY;  Surgeon: Karie Soda, MD;  [facility];  Service: General;  Laterality: N/A;  GEN w/ERAS PATHWAY LOCAL   INTERCOSTAL NERVE BLOCK Left 12/16/2019   Procedure: INTERCOSTAL NERVE BLOCK;  Surgeon: Corliss Skains, MD;  [facility];  Service: Thoracic;  Laterality: Left;   IR FLUORO RM 30-60 MIN  07/02/2022   KNEE SURGERY  07/20/2008   bilat.   NASAL SEPTUM SURGERY  1995   NODE DISSECTION Left 12/16/2019   Procedure: NODE DISSECTION;  Surgeon: Corliss Skains, MD;  [facility];  Service: Thoracic;  Laterality: Left;   PEG PLACEMENT N/A 07/04/2022   Procedure: PERCUTANEOUS ENDOSCOPIC GASTROSTOMY (PEG) PLACEMENT;  Surgeon: Iva Boop, MD;  [facility];  Service: Gastroenterology;  Laterality: N/A;   RADIOACTIVE SEED GUIDED AXILLARY SENTINEL LYMPH NODE Left 03/20/2022    Procedure: RADIOACTIVE SEED GUIDED LEFT AXILLARY SENTINEL LYMPH NODE BIOPSY;  Surgeon: Harriette Bouillon, MD;  [facility];  Service: General;  Laterality: Left;   RECTAL EXAM UNDER ANESTHESIA N/A 03/22/2022   Procedure: ANORECTAL EXAMINATION UNDER ANESTHESIA;  Surgeon: Karie Soda, MD;  [facility];  Service: General;  Laterality: N/A;   TONSILLECTOMY     TRANSURETHRAL RESECTION OF BLADDER TUMOR  2002   UPPER GASTROINTESTINAL ENDOSCOPY     VIDEO BRONCHOSCOPY  04/11/2022   Procedure: VIDEO BRONCHOSCOPY;  Surgeon: Josephine Igo, DO;  [facility];  Service: Cardiopulmonary;;   VIDEO BRONCHOSCOPY WITH ENDOBRONCHIAL NAVIGATION N/A 10/13/2019   Procedure: VIDEO BRONCHOSCOPY WITH ENDOBRONCHIAL NAVIGATION;  Surgeon: Josephine Igo, DO;  [facility];  Service: Pulmonary;  Laterality: N/A;   WISDOM TOOTH EXTRACTION     XI ROBOTIC ASSISTED LOWER ANTERIOR RESECTION N/A 12/14/2021   Procedure: XI ROBOTIC ASSISTED LOWER ANTERIOR ULTRA LOW RECTOSIGMOID RESECTION, COLOANAL HAND SEWN ANASTOMOSIS, AND BILATERAL TAP BLOCK;  Surgeon: Karie Soda, MD;  [facility];  Service: General;  Laterality: N/A;   Social History   Social History Narrative   Married   3 children: 3 grandchildren; 3 great grandchildren   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   Review of Systems As per HPI  Objective:   Physical Exam BP 118/68   Pulse (!) 103   Ht 5\' 11"  (1.803 m)   Wt 134 lb (60.8 kg)   BMI 18.69 kg/m   Small left inguinal hernia - reducible  Gastrostomy left upper quadrant - removed w/o complication and bandaged

## 2023-05-13 NOTE — Telephone Encounter (Signed)
Yesterday felt sluggish , no energy , weak. Can he take dexamethasone now. He did not get it until 3 days after his treatment. PEr Cassies , I told pt the dex is for the pre /post nausea and not for his symptoms. He then stated " I do feel better today". I instructed him on when to call. Jesse [name]

## 2023-05-13 NOTE — Patient Instructions (Signed)
Keep the area covered until it closes. No water in it for 24 hours.  I "Hope you don't get a Bellyache from today"  I appreciate the opportunity to care for you. Stan Head, MD, [facility]

## 2023-05-14 ENCOUNTER — Inpatient Hospital Stay: Payer: Medicare Other

## 2023-05-20 ENCOUNTER — Other Ambulatory Visit: Payer: Self-pay | Admitting: Physician Assistant

## 2023-05-20 DIAGNOSIS — Z01818 Encounter for other preprocedural examination: Secondary | ICD-10-CM

## 2023-05-21 ENCOUNTER — Encounter (HOSPITAL_COMMUNITY): Payer: Self-pay

## 2023-05-21 ENCOUNTER — Ambulatory Visit (HOSPITAL_COMMUNITY)
Admission: RE | Admit: 2023-05-21 | Discharge: 2023-05-21 | Disposition: A | Discharge status: Home / Self Care | Payer: Medicare Other | Source: Ambulatory Visit | Attending: Physician Assistant | Admitting: Physician Assistant

## 2023-05-21 ENCOUNTER — Inpatient Hospital Stay: Payer: Medicare Other

## 2023-05-21 ENCOUNTER — Ambulatory Visit (HOSPITAL_COMMUNITY)
Admission: RE | Admit: 2023-05-21 | Discharge: 2023-05-21 | Disposition: A | Discharge status: Home / Self Care | Payer: Medicare Other | Source: Ambulatory Visit | Attending: Physician Assistant

## 2023-05-21 DIAGNOSIS — Z87891 Personal history of nicotine dependence: Secondary | ICD-10-CM | POA: Diagnosis not present

## 2023-05-21 DIAGNOSIS — Z01818 Encounter for other preprocedural examination: Secondary | ICD-10-CM

## 2023-05-21 DIAGNOSIS — C3492 Malignant neoplasm of unspecified part of left bronchus or lung: Secondary | ICD-10-CM | POA: Insufficient documentation

## 2023-05-21 DIAGNOSIS — Z9221 Personal history of antineoplastic chemotherapy: Secondary | ICD-10-CM | POA: Diagnosis not present

## 2023-05-21 DIAGNOSIS — C3490 Malignant neoplasm of unspecified part of unspecified bronchus or lung: Secondary | ICD-10-CM | POA: Diagnosis not present

## 2023-05-21 HISTORY — PX: IR IMAGING GUIDED PORT INSERTION: IMG5740

## 2023-05-21 MED ORDER — FENTANYL CITRATE (PF) 100 MCG/2ML IJ SOLN
INTRAMUSCULAR | Status: AC | PRN
  Administered 2023-05-21: 25 ug via INTRAVENOUS

## 2023-05-21 MED ORDER — LIDOCAINE HCL 1 % IJ SOLN
INTRAMUSCULAR | Status: AC
  Filled 2023-05-21: qty 20

## 2023-05-21 MED ORDER — HEPARIN SOD (PORK) LOCK FLUSH 100 UNIT/ML IV SOLN
500.0000 [IU] | Freq: Once | INTRAVENOUS | Status: AC
Start: 2023-05-21 — End: 2023-05-21
  Administered 2023-05-21: 500 [IU] via INTRAVENOUS

## 2023-05-21 MED ORDER — SODIUM CHLORIDE 0.9% FLUSH: 3.0000 mL | Freq: Two times a day (BID) | INTRAVENOUS | Status: DC

## 2023-05-21 MED ORDER — MIDAZOLAM HCL 2 MG/2ML IJ SOLN
INTRAMUSCULAR | Status: AC | PRN
  Administered 2023-05-21: .5 mg via INTRAVENOUS

## 2023-05-21 MED ORDER — SODIUM CHLORIDE 0.9% FLUSH: 3.0000 mL | INTRAVENOUS | Status: DC | PRN

## 2023-05-21 MED ORDER — HEPARIN SOD (PORK) LOCK FLUSH 100 UNIT/ML IV SOLN
INTRAVENOUS | Status: AC
  Filled 2023-05-21: qty 5

## 2023-05-21 MED ORDER — FENTANYL CITRATE (PF) 100 MCG/2ML IJ SOLN
INTRAMUSCULAR | Status: AC | PRN
  Administered 2023-05-21: 50 ug via INTRAVENOUS

## 2023-05-21 MED ORDER — LIDOCAINE HCL 1 % IJ SOLN
20.0000 mL | Freq: Once | INTRAMUSCULAR | Status: AC
Start: 2023-05-21 — End: 2023-05-21
  Administered 2023-05-21: 15 mL via INTRADERMAL

## 2023-05-21 MED ORDER — SODIUM CHLORIDE 0.9 % IV SOLN: INTRAVENOUS | Status: DC

## 2023-05-21 MED ORDER — MIDAZOLAM HCL 2 MG/2ML IJ SOLN
INTRAMUSCULAR | Status: AC
Start: 2023-05-21 — End: ?
  Filled 2023-05-21: qty 2

## 2023-05-21 MED ORDER — MIDAZOLAM HCL 2 MG/2ML IJ SOLN
INTRAMUSCULAR | Status: AC | PRN
  Administered 2023-05-21: 1 mg via INTRAVENOUS

## 2023-05-21 MED ORDER — FENTANYL CITRATE (PF) 100 MCG/2ML IJ SOLN
INTRAMUSCULAR | Status: AC
  Filled 2023-05-21: qty 2

## 2023-05-21 NOTE — H&P (Signed)
 Chief Complaint: Patient was seen in consultation today for port placement   Referring Physician(s): Heilingoetter,Cassandra L  Supervising Physician: Luverne Aran  Patient Status: [facility] - Out-pt  History of Present Illness: Jesse [name] is a 73 y.o. male with recurrent non-small cell lung cancer, adenocarcinoma with positive mediastinal lymph node in December 2023 that was initially diagnosed as stage IIB (T1 a, N1, M0) non-small cell lung cancer, adenocarcinoma diagnosed in June 2021. Previously underwent neoadjuvant chemo in 2021.  He will now have systemic therapy again and is referred for port placement.  PMHx, meds, labs, imaging, allergies reviewed. Feels well, no recent fevers, chills, illness. Has been NPO today as directed.    Past Medical History:  Diagnosis Date   Anemia    Cancer (HCC)    bladder cancer   COPD (chronic obstructive pulmonary disease) (HCC)    mild    DIVERTICULOSIS, COLON 01/14/2007   Qualifier: Diagnosis of  By: Nunzio RN, Dagoberto Caldron    GASTRITIS, CHRONIC 06/01/2004   Qualifier: Diagnosis of  By: Bettie CMA (AAMA), Robin     GERD 01/09/2007   Qualifier: Diagnosis of  By: Krystal, RN, Ellen     Heart murmur    as a child; no murmur heard 12/14/19   History of radiation therapy    Mediastinum- 05/15/22-06/28/22-Dr. Lynwood Nasuti   HYPERLIPIDEMIA 01/09/2007   Qualifier: Diagnosis of  By: Krystal RN, Leeroy     Hypertension    Lung mass    left upper nodule   Macular degeneration    right eye   NEOPLASM, MALIGNANT, BLADDER, HX OF 2002   Qualifier: Diagnosis of  By: Bettie CMA (AAMA), Robin  / no chemo or radiation   OSTEOARTHRITIS 01/14/2007   Qualifier: Diagnosis of  By: Nunzio, RN, Dagoberto Caldron - knees   Rectal mass    Reflux esophagitis 06/01/2004   Qualifier: Diagnosis of  By: Bettie CMA (AAMA), Robin     Stroke [facility])    TOBACCO USER 02/16/2009   Qualifier: Diagnosis of  By: Jame  MD, Maude FALCON    VITAMIN D  DEFICIENCY  06/03/2007   Qualifier: Diagnosis of  By: Jame  MD, Maude FALCON     Past Surgical History:  Procedure Laterality Date   BIOPSY  06/18/2022   Procedure: BIOPSY;  Surgeon: San Sandor GAILS, DO;  Location: [facility];  Service: Gastroenterology;;   BREAST BIOPSY Left 03/16/2022   US  LT RADIOACTIVE SEED LOC 03/16/2022 [facility]   BRONCHIAL BIOPSY  10/13/2019   Procedure: BRONCHIAL BIOPSIES;  Surgeon: Brenna Adine CROME, DO;  Location: [facility];  Service: Pulmonary;;   BRONCHIAL BIOPSY  12/04/2022   Procedure: BRONCHIAL BIOPSIES;  Surgeon: Brenna Adine CROME, DO;  Location: [facility];  Service: Pulmonary;;   BRONCHIAL BRUSHINGS  10/13/2019   Procedure: BRONCHIAL BRUSHINGS;  Surgeon: Brenna Adine CROME, DO;  [facility]: [facility];  Service: Pulmonary;;   BRONCHIAL NEEDLE ASPIRATION BIOPSY  10/13/2019   Procedure: BRONCHIAL NEEDLE ASPIRATION BIOPSIES;  Surgeon: Brenna Adine CROME, DO;  [facility];  Service: Pulmonary;;   BRONCHIAL NEEDLE ASPIRATION BIOPSY  04/11/2022   Procedure: BRONCHIAL NEEDLE ASPIRATION BIOPSIES;  Surgeon: Brenna Adine CROME, DO;  Location: [facility];  Service: Cardiopulmonary;;   BRONCHIAL NEEDLE ASPIRATION BIOPSY  12/04/2022   Procedure: BRONCHIAL NEEDLE ASPIRATION BIOPSIES;  Surgeon: Brenna Adine CROME, DO;  [facility]: [facility];  Service: Pulmonary;;   BRONCHIAL WASHINGS  10/13/2019   Procedure: BRONCHIAL WASHINGS;  Surgeon: Brenna Adine CROME, DO;  [facility];  Service: Pulmonary;;   BRONCHIAL WASHINGS  12/04/2022   Procedure: BRONCHIAL WASHINGS;  Surgeon: Brenna Adine CROME, DO;  [facility];  Service: Pulmonary;;   COLONOSCOPY     multiple   DIVERTING ILEOSTOMY N/A 12/14/2021   Procedure: DIVERTING ILEOSTOMY;  Surgeon: Sheldon Standing, MD;  Location: [facility];  Service: General;  Laterality: N/A;   ENDOBRONCHIAL ULTRASOUND Bilateral 04/11/2022   Procedure: ENDOBRONCHIAL ULTRASOUND;  Surgeon: Brenna Adine CROME, DO;  Location: [facility];  Service: Cardiopulmonary;  Laterality: Bilateral;   ESOPHAGEAL DILATION  06/18/2022   Procedure: ESOPHAGEAL DILATION;  Surgeon: San Sandor GAILS, DO;  Location: [facility];  Service: Gastroenterology;;   ESOPHAGOGASTRODUODENOSCOPY (EGD) WITH PROPOFOL  N/A 06/18/2022   Procedure: ESOPHAGOGASTRODUODENOSCOPY (EGD) WITH PROPOFOL ;  Surgeon: San Sandor GAILS, DO;  Location: [facility];  Service: Gastroenterology;  Laterality: N/A;  Severe dysphagia/odynophagia ?radiation esophagitis versus oral candidiasis   FIDUCIAL MARKER PLACEMENT  12/04/2022   Procedure: FIDUCIAL MARKER PLACEMENT;  Surgeon: Brenna Adine CROME, DO;  [facility];  Service: Pulmonary;;   ILEOSTOMY CLOSURE N/A 03/22/2022   Procedure: OPEN TAKEDOWN OF LOOP ILEOSTOMY;  Surgeon: Sheldon Standing, MD;  Location: [facility];  Service: General;  Laterality: N/A;  GEN w/ERAS PATHWAY LOCAL   INTERCOSTAL NERVE BLOCK Left 12/16/2019   Procedure: INTERCOSTAL NERVE BLOCK;  Surgeon: Shyrl Linnie KIDD, MD;  Location: [facility];  Service: Thoracic;  Laterality: Left;   IR FLUORO RM 30-60 MIN  07/02/2022   KNEE SURGERY  07/20/2008   bilat.   NASAL SEPTUM SURGERY  1995   NODE DISSECTION Left 12/16/2019   Procedure: NODE DISSECTION;  Surgeon: Shyrl Linnie KIDD, MD;  [facility]: [facility];  Service: Thoracic;  Laterality: Left;   PEG PLACEMENT N/A 07/04/2022   Procedure: PERCUTANEOUS ENDOSCOPIC GASTROSTOMY (PEG) PLACEMENT;  Surgeon: Avram Lupita BRAVO, MD;  Location: [facility];  Service: Gastroenterology;  Laterality: N/A;   RADIOACTIVE SEED GUIDED AXILLARY SENTINEL LYMPH NODE Left 03/20/2022   Procedure: RADIOACTIVE SEED GUIDED LEFT AXILLARY SENTINEL LYMPH NODE BIOPSY;  Surgeon: Vanderbilt Ned, MD;  Location: [facility];  Service: General;  Laterality: Left;   RECTAL EXAM UNDER ANESTHESIA N/A 03/22/2022   Procedure: ANORECTAL EXAMINATION UNDER ANESTHESIA;  Surgeon: Sheldon Standing, MD;  Location: [facility];  Service: General;  Laterality: N/A;   TONSILLECTOMY      TRANSURETHRAL RESECTION OF BLADDER TUMOR  2002   UPPER GASTROINTESTINAL ENDOSCOPY     VIDEO BRONCHOSCOPY  04/11/2022   Procedure: VIDEO BRONCHOSCOPY;  Surgeon: Brenna Adine CROME, DO;  [facility]: [facility];  Service: Cardiopulmonary;;   VIDEO BRONCHOSCOPY WITH ENDOBRONCHIAL NAVIGATION N/A 10/13/2019   Procedure: VIDEO BRONCHOSCOPY WITH ENDOBRONCHIAL NAVIGATION;  Surgeon: Brenna Adine CROME, DO;  [facility];  Service: Pulmonary;  Laterality: N/A;   WISDOM TOOTH EXTRACTION     XI ROBOTIC ASSISTED LOWER ANTERIOR RESECTION N/A 12/14/2021   Procedure: XI ROBOTIC ASSISTED LOWER ANTERIOR ULTRA LOW RECTOSIGMOID RESECTION, COLOANAL HAND SEWN ANASTOMOSIS, AND BILATERAL TAP BLOCK;  Surgeon: Sheldon Standing, MD;  Location: [facility];  Service: General;  Laterality: N/A;    Allergies: Patient has no known allergies.  Medications: Prior to Admission medications   Medication Sig Start Date End Date Taking? Authorizing Provider  acetaminophen  (TYLENOL ) 500 MG tablet Take 1,000 mg by mouth every 8 (eight) hours as needed for moderate pain.    [provider]  aspirin  81 MG chewable tablet Chew 1 tablet (81 mg total) by mouth daily. 07/06/22   Danford, Lonni SQUIBB, MD  atorvastatin  (  LIPITOR) 20 MG tablet Take 1 tablet (20 mg total) by mouth daily. 01/23/23   [name], MD  Cholecalciferol  (VITAMIN D3 PO) Take 2,000 Units by mouth daily.    [provider]  dexamethasone  (DECADRON ) 4 MG tablet Please take 1 tablet twice a day the day before, the day of, and the day after chemotherapy 05/07/23   Heilingoetter, Cassandra L, PA-C  diphenoxylate -atropine  (LOMOTIL ) 2.5-0.025 MG tablet Take 2 tablets by mouth 3 (three) times daily as needed for diarrhea or loose stools. 11/13/22   Pickenpack-Cousar, Fannie SAILOR, NP  ferrous sulfate  325 (65 FE) MG EC tablet Take 1 tablet (325 mg total) by mouth 2 (two) times daily. 08/01/22 08/01/23  [name], MD  folic acid  (FOLVITE ) 1 MG tablet TAKE 1  TABLET BY MOUTH EVERY DAY 03/04/23   Sherrod Sherrod, MD  lidocaine  (XYLOCAINE ) 5 % ointment Apply 1 Application topically 3 (three) times daily as needed for mild pain or moderate pain. 08/15/22   Pickenpack-Cousar, Athena N, NP  lidocaine -prilocaine  (EMLA ) cream Apply 1 Application topically as needed. 05/07/23   Heilingoetter, Cassandra L, PA-C  loperamide  (IMODIUM  A-D) 2 MG tablet Take 4 mg by mouth 4 (four) times daily as needed for diarrhea or loose stools.    [provider]  mirtazapine  (REMERON  SOL-TAB) 30 MG disintegrating tablet TAKE 1 TABLET BY MOUTH AT BEDTIME. 02/01/23   Avram Lupita BRAVO, MD  Multiple Vitamins-Minerals (PRESERVISION AREDS 2) CAPS Take 1 capsule by mouth 2 (two) times daily.    [provider]  OVER THE COUNTER MEDICATION Take 1 tablet by mouth daily. PROBIOTICS    [provider]  OVER THE COUNTER MEDICATION Fiber and spice pt take every morning    [provider]  OVER THE COUNTER MEDICATION Juce pt takes every morning    [provider]  pantoprazole  (PROTONIX ) 40 MG tablet Take 1 tablet (40 mg total) by mouth 2 (two) times daily. 11/26/22   Pickenpack-Cousar, Fannie SAILOR, NP  prochlorperazine  (COMPAZINE ) 10 MG tablet Take 1 tablet (10 mg total) by mouth every 6 (six) hours as needed for nausea or vomiting. 12/11/22   Sherrod Sherrod, MD     Family History  Problem Relation Age of Onset   Dementia Mother    Diabetes Father    Mental illness Father    Peripheral vascular disease Brother    Colon cancer Neg Hx    Esophageal cancer Neg Hx    Stomach cancer Neg Hx    Rectal cancer Neg Hx     Social History   Socioeconomic History   Marital status: Married    Spouse name: [name]   Number of children: 3   Years of education: Not on file   Highest education level: 12th grade  Occupational History    Comment: semi retired   Tobacco Use   Smoking status: Former    Current packs/day: 0.00    Average packs/day: 0.5  packs/day for 40.0 years (20.0 ttl pk-yrs)    Types: Cigarettes    Start date: 09/21/1979    Quit date: 09/21/2019    Years since quitting: 3.6   Smokeless tobacco: Never  Vaping Use   Vaping status: Never Used  Substance and Sexual Activity   Alcohol use: Not Currently    Alcohol/week: 15.0 standard drinks of alcohol    Types: 15 Standard drinks or equivalent per week    Comment: wine/beer/martini   Drug use: Not Currently    Types: Marijuana  Comment: Smokes marijuana occasional, last use in 08/2019   Sexual activity: Not Currently  Other Topics Concern   Not on file  Social History Narrative   Married   3 children: 3 grandchildren; 3 great grandchildren   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   Enjoys yard work and golf   Social Drivers of Corporate Investment Banker Strain: Low Risk  (10/30/2022)   Overall Financial Resource Strain (CARDIA)    Difficulty of Paying Living Expenses: Not hard at all  Food Insecurity: No Food Insecurity (10/30/2022)   [name] Vital Sign    Worried About Running Out of Food in the Last Year: Never true    Ran Out of Food in the Last Year: Never true  Transportation Needs: No Transportation Needs (10/30/2022)   PRAPARE - Administrator, Civil Service (Medical): No    Lack of Transportation (Non-Medical): No  Physical Activity: Sufficiently Active (10/30/2022)   Exercise Vital Sign    Days of Exercise per Week: 5 days    Minutes of Exercise per Session: 30 min  Stress: No Stress Concern Present (10/30/2022)   [employer] of Occupational Health - Occupational Stress Questionnaire    Feeling of Stress : Not at all  Social Connections: Moderately Isolated (07/12/2020)   Social Connection and Isolation Panel [NHANES]    Frequency of Communication with Friends and Family: More than three times a week    Frequency of Social Gatherings with Friends and Family: More than three times a week    Attends Religious Services: Never     Database Administrator or Organizations: No    Attends Banker Meetings: Never    Marital Status: Married    Review of Systems: A 12 point ROS discussed and pertinent positives are indicated in the HPI above.  All other systems are negative.  Review of Systems  Vital Signs: BP 126/78   Pulse 87   Temp 98 F (36.7 C) (Oral)   Resp 18   Ht 5' 11 (1.803 m)   Wt 134 lb (60.8 kg)   SpO2 99%   BMI 18.69 kg/m   Physical Exam HENT:     Mouth/Throat:     Mouth: Mucous membranes are moist.     Pharynx: Oropharynx is clear.  Cardiovascular:     Rate and Rhythm: Normal rate and regular rhythm.  Pulmonary:     Effort: Pulmonary effort is normal. No respiratory distress.     Breath sounds: Normal breath sounds.  Skin:    General: Skin is warm and dry.  Neurological:     General: No focal deficit present.     Mental Status: He is alert and oriented to person, place, and time.  Psychiatric:        Mood and Affect: Mood normal.        Thought Content: Thought content normal.     Imaging: CT CHEST ABDOMEN PELVIS W CONTRAST Result Date: 04/30/2023 CLINICAL DATA:  Metastatic non-small cell lung cancer restaging * Tracking Code: BO * EXAM: CT CHEST, ABDOMEN, AND PELVIS WITH CONTRAST TECHNIQUE: Multidetector CT imaging of the chest, abdomen and pelvis was performed following the standard protocol during bolus administration of intravenous contrast. RADIATION DOSE REDUCTION: This exam was performed according to the departmental dose-optimization program which includes automated exposure control, adjustment of the mA and/or kV according to patient size and/or use of iterative reconstruction technique. CONTRAST:  OMNIPAQUE  IOHEXOL  300 MG/ML  SOLN  COMPARISON:  02/26/2023 FINDINGS: CT CHEST FINDINGS Cardiovascular: Aortic atherosclerosis. Normal heart size. Three-vessel coronary artery calcifications. No pericardial effusion. Mediastinum/Nodes: No enlarged mediastinal, hilar, or  axillary lymph nodes. Thyroid  gland, trachea, and esophagus demonstrate no significant findings. Lungs/Pleura: Status post left upper lobectomy. Unchanged postoperative and post treatment appearance of the chest status post left upper lobectomy with paramedian radiation fibrosis. Multiple small pulmonary nodules not significantly changed, for example a 0.6 cm nodule of the right apex (series 4, image 25) and a 1.0 cm nodule of the anterior right upper lobe (series 4, image 39). Small, loculated left pleural effusion. Interval resolution of previously seen left pneumothorax component. Mild centrilobular emphysema. Musculoskeletal: No chest wall abnormality. No acute osseous findings. CT ABDOMEN PELVIS FINDINGS Hepatobiliary: No solid liver abnormality is seen. No gallstones, gallbladder wall thickening, or biliary dilatation. Pancreas: Unremarkable. No pancreatic ductal dilatation or surrounding inflammatory changes. Spleen: Normal in size without significant abnormality. Adrenals/Urinary Tract: Unchanged left adrenal nodule measuring 1.4 x 1.4 cm (series 2, image 61). Nonobstructive calculus of the midportion of the right kidney. No left-sided calculi, ureteral calculi, or hydronephrosis. Bladder is unremarkable. Stomach/Bowel: Percutaneous gastrostomy. Appendix appears normal. No evidence of bowel wall thickening, distention, or inflammatory changes. Vascular/Lymphatic: Severe aortic atherosclerosis. No enlarged abdominal or pelvic lymph nodes. Reproductive: Prostatomegaly. Other: No abdominal wall hernia or abnormality. No ascites. Musculoskeletal: No acute osseous findings. IMPRESSION: 1. Unchanged postoperative and post treatment appearance of the chest status post left upper lobectomy with paramedian radiation fibrosis. 2. Multiple small pulmonary nodules not significantly changed. 3. Small, loculated left pleural effusion. Interval resolution of previously seen left pneumothorax component. 4. Unchanged left  adrenal nodule, nonspecific but statistically most likely an adenoma. Attention on follow-up. 5. No evidence of lymphadenopathy or metastatic disease in the abdomen or pelvis. 6. Emphysema. 7. Coronary artery disease. Aortic Atherosclerosis (ICD10-I70.0) and Emphysema (ICD10-J43.9). Electronically Signed   By: Marolyn JONETTA Jaksch M.D.   On: 04/30/2023 21:57    Labs:  CBC: Recent Labs    04/02/23 1158 04/16/23 0819 04/30/23 1322 05/07/23 0919  WBC 1.5* 2.7* 3.0* 3.4*  HGB 9.9* 9.3* 8.9* 9.9*  HCT 30.7* 28.5* 27.1* 31.2*  PLT 126* 174 111* 223    COAGS: Recent Labs    06/06/22 0035 06/29/22 0714  INR 1.1 1.0  APTT 36  --     BMP: Recent Labs    04/02/23 1158 04/16/23 0819 04/30/23 1322 05/07/23 0919  NA 136 139 141 139  K 4.2 4.3 3.8 3.6  CL 101 103 105 103  CO2 29 31 31 29   GLUCOSE 98 93 92 92  BUN 23 21 25* 29*  CALCIUM  9.5 9.7 9.8 9.7  CREATININE 0.61 0.71 0.64 0.69  GFRNONAA >60 >60 >60 >60    LIVER FUNCTION TESTS: Recent Labs    04/02/23 1158 04/16/23 0819 04/30/23 1322 05/07/23 0919  BILITOT 0.6 0.4 0.4 0.3  AST 19 28 26 24   ALT 13 49* 37 25  ALKPHOS 112 136* 135* 129*  PROT 6.9 7.3 7.5 7.6  ALBUMIN  3.8 3.8 4.0 3.9     Assessment and Plan: Recurrent lung cancer. For port placement. Risks and benefits of image guided port-a-catheter placement was discussed with the patient including, but not limited to bleeding, infection, pneumothorax, or fibrin sheath development and need for additional procedures.  All of the patient's questions were answered, patient is agreeable to proceed. Consent signed and in chart.    Electronically Signed: Franky Rusk, PA-C 05/21/2023, 10:16 AM  I spent a total of 20 minutes in face to face in clinical consultation, greater than 50% of which was counseling/coordinating care for port

## 2023-05-21 NOTE — Discharge Instructions (Signed)
 Implanted Port Insertion, Care After  The following information offers guidance on how to care for yourself after your procedure. Your health care provider may also give you more specific instructions. If you have problems or questions, contact your health care provider.  What can I expect after the procedure? After the procedure, it is common to have: Discomfort at the port insertion site. Bruising on the skin over the port. This should improve over 3-4 days.   Urgent needs - Interventional Radiology, clinic [phone] (mon-fri 8-5).   Wound - May remove dressing and shower in 24 to 48 hours.  Keep site clean and dry.  Replace with bandaid as needed.  Do not submerge in tub or water until site healing well. If closed with glue, glue will flake off on its own.   If ordered by your provider, may start Emla cream (or any other creams ointments or lotions) in 2 weeks or after incision is healed. Port is ready for use immediately.   After completion of treatment, your provider should have you set up for monthly port flushes.   Follow these instructions at home: [facility] care After your port is placed, you will get a manufacturer's information card. The card has information about your port. Keep this card with you at all times. Take care of the port as told by your health care provider. Ask your health care provider if you or a family member can get training for taking care of the port at home. A home health care nurse will be be available to help care for the port. Make sure to remember what type of port you have. Incision care     Follow instructions from your health care provider about how to take care of your port insertion site. Make sure you: Wash your hands with soap and water for at least 20 seconds before and after you change your bandage (dressing). If soap and water are not available, use hand sanitizer. Change your dressing as told by your health care provider. Leave stitches  (sutures), skin glue, or adhesive strips in place. These skin closures may need to stay in place for 2 weeks or longer. If adhesive strip edges start to loosen and curl up, you may trim the loose edges. Do not remove adhesive strips completely unless your health care provider tells you to do that. Check your port insertion site every day for signs of infection. Check for: Redness, swelling, or pain. Fluid or blood. Warmth. Pus or a bad smell. Activity Return to your normal activities as told by your health care provider. Ask your health care provider what activities are safe for you. You may have to avoid lifting. Ask your health care provider how much you can safely lift. General instructions Take over-the-counter and prescription medicines only as told by your health care provider. Do not take baths, swim, or use a hot tub until your health care provider approves. Ask your health care provider if you may take showers. You may only be allowed to take sponge baths. If you were given a sedative during the procedure, it can affect you for several hours. Do not drive or operate machinery until your health care provider says that it is safe. Wear a medical alert bracelet in case of an emergency. This will tell any health care providers that you have a port. Keep all follow-up visits. This is important. Contact a health care provider if: You cannot flush your port with saline as directed, or you cannot  draw blood from the port. You have a fever or chills. You have redness, swelling, or pain around your port insertion site. You have fluid or blood coming from your port insertion site. Your port insertion site feels warm to the touch. You have pus or a bad smell coming from the port insertion site. Get help right away if: You have chest pain or shortness of breath. You have bleeding from your port that you cannot control. These symptoms may be an emergency. Get help right away. Call 911. Do not  wait to see if the symptoms will go away. Do not drive yourself to the hospital. Summary Take care of the port as told by your health care provider. Keep the manufacturer's information card with you at all times. Change your dressing as told by your health care provider. Contact a health care provider if you have a fever or chills or if you have redness, swelling, or pain around your port insertion site. Keep all follow-up visits. This information is not intended to replace advice given to you by your health care provider. Make sure you discuss any questions you have with your health care provider. Document Revised: 10/04/2020 Document Reviewed: 10/04/2020 Elsevier Patient Education  2023 Elsevier Inc[employer]    Moderate Conscious Sedation  Adult  Care After (English)  After the procedure, it is common to have: Sleepiness for a few hours. Impaired judgment for a few hours. Trouble with balance. Nausea or vomiting if you eat too soon. Follow these instructions at home: For the time period you were told by your health care provider:  Rest. Do not participate in activities where you could fall or become injured. Do not drive or use machinery. Do not drink alcohol. Do not take sleeping pills or medicines that cause drowsiness. Do not make important decisions or sign legal documents. Do not take care of children on your own. Eating and drinking Follow instructions from your health care provider about what you may eat and drink. Drink enough fluid to keep your urine pale yellow. If you vomit: Drink clear fluids slowly and in small amounts as you are able. Clear fluids include water, ice chips, low-calorie sports drinks, and fruit juice that has water added to it (diluted fruit juice). Eat light and bland foods in small amounts as you are able. These foods include bananas, applesauce, rice, lean meats, toast, and crackers. General instructions Take over-the-counter and prescription medicines  only as told by your health care provider. Have a responsible adult stay with you for the time you are told. Do not use any products that contain nicotine or tobacco. These products include cigarettes, chewing tobacco, and vaping devices, such as e-cigarettes. If you need help quitting, ask your health care provider. Return to your normal activities as told by your health care provider. Ask your health care provider what activities are safe for you. Your health care provider may give you more instructions. Make sure you know what you can and cannot do. Contact a health care provider if: You are still sleepy or having trouble with balance after 24 hours. You feel light-headed. You vomit every time you eat or drink. You get a rash. You have a fever. You have redness or swelling around the IV site. Get help right away if: You have trouble breathing. You start to feel confused at home. These symptoms may be an emergency. Get help right away. Call 911. Do not wait to see if the symptoms will go away. Do not  drive yourself to the hospital. This information is not intended to replace advice given to you by your health care provider. Make sure you discuss any questions you have with your health care provider.

## 2023-05-21 NOTE — Procedures (Signed)
 Interventional Radiology Procedure Note  Procedure: Single Lumen Power Port Placement    Access:  Right IJ vein.  Findings: Catheter tip positioned at SVC/RA junction. Port is ready for immediate use.   Complications: None  EBL: < 10 mL  Recommendations:  - Ok to shower in 24 hours - Do not submerge for 7 days - Routine line care   Jesse [name] T. Fredia Sorrow, M.D Pager:  [phone]

## 2023-05-27 NOTE — Progress Notes (Signed)
Palliative Medicine [name]  [phone] Fax:(336) [phone]   Name: Jesse [name] Date: 05/27/2023 MRN: [id number]  DOB: Feb 21, 1951  Patient Care Team: [name], MD as PCP - General (Family Medicine) Si Gaul, MD as Consulting Physician (Oncology) Verner Chol, [facility] (Inactive) as Pharmacist (Pharmacist) Iva Boop, MD as Consulting Physician (Gastroenterology) Josephine Igo, DO as Consulting Physician (Pulmonary Disease) Berneice Heinrich Delbert Phenix., MD as Consulting Physician (Urology) Karie Soda, MD as Consulting Physician (General Surgery) Pricilla Riffle, MD as Consulting Physician (Cardiology)    INTERVAL HISTORY: Jesse [name] is a 73 y.o. 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 Previous medical history also includes hypertension, hyperlipidemia, GERD, anemia, COPD, and macular degeneration and a stroke during most recent hospitalization. Surgical history includes ileostomy and takedown. Peg tube placed (07/04/2022) for esophageal stricture/dysphagia. Palliative asked to see for symptom management and goals of care.   SOCIAL HISTORY:     reports that he quit smoking about 3 years ago. His smoking use included cigarettes. He started smoking about 43 years ago. He has a 20 pack-year smoking history. He has never used smokeless tobacco. He reports that he does not currently use alcohol after a past usage of about 15.0 standard drinks of alcohol per week. He reports that he does not currently use drugs after having used the following drugs: Marijuana.  ADVANCE DIRECTIVES:  Advanced directives on file  CODE STATUS: Full code  PAST MEDICAL HISTORY: Past Medical History:  Diagnosis Date   Anemia    Cancer (HCC)    bladder cancer   COPD (chronic obstructive pulmonary disease)  (HCC)    mild    DIVERTICULOSIS, COLON 01/14/2007   Qualifier: Diagnosis of  By: Marcelyn Ditty RN, Katy Fitch    GASTRITIS, CHRONIC 06/01/2004   Qualifier: Diagnosis of  By: Creta Levin CMA (AAMA), Robin     GERD 01/09/2007   Qualifier: Diagnosis of  By: Tawanna Cooler, RN, Ellen     Heart murmur    as a child; no murmur heard 12/14/19   History of radiation therapy    Mediastinum- 05/15/22-06/28/22-Dr. Antony Blackbird   HYPERLIPIDEMIA 01/09/2007   Qualifier: Diagnosis of  By: Everett Graff     Hypertension    Lung mass    left upper nodule   Macular degeneration    right eye   NEOPLASM, MALIGNANT, BLADDER, HX OF 2002   Qualifier: Diagnosis of  By: Creta Levin CMA (AAMA), Robin  / no chemo or radiation   OSTEOARTHRITIS 01/14/2007   Qualifier: Diagnosis of  By: Marcelyn Ditty, RN, Katy Fitch - knees   Rectal mass    Reflux esophagitis 06/01/2004   Qualifier: Diagnosis of  By: Creta Levin CMA Duncan Dull), Robin     Stroke [facility])    2024   TOBACCO USER 02/16/2009   Qualifier: Diagnosis of  By: Amador Cunas  MD, Janett Labella    VITAMIN D DEFICIENCY 06/03/2007   Qualifier: Diagnosis of  By: Amador Cunas  MD, Janett Labella     ALLERGIES:  has no known allergies.  MEDICATIONS:  Current Outpatient Medications  Medication Sig Dispense Refill   acetaminophen (TYLENOL) 500 MG tablet Take 1,000 mg by mouth every 8 (eight) hours as needed for moderate pain.     aspirin EC 81 MG tablet Take 81 mg by mouth daily. Swallow whole.     atorvastatin (LIPITOR)  20 MG tablet Take 1 tablet (20 mg total) by mouth daily. 90 tablet 3   Cholecalciferol (VITAMIN D3 PO) Take 2,000 Units by mouth daily.     dexamethasone (DECADRON) 4 MG tablet Please take 1 tablet twice a day the day before, the day of, and the day after chemotherapy 60 tablet 0   diphenoxylate-atropine (LOMOTIL) 2.5-0.025 MG tablet Take 2 tablets by mouth 3 (three) times daily as needed for diarrhea or loose stools. 90 tablet 0   ferrous sulfate 325 (65 FE) MG EC tablet Take 1 tablet (325  mg total) by mouth 2 (two) times daily. 60 tablet 1   folic acid (FOLVITE) 1 MG tablet TAKE 1 TABLET BY MOUTH EVERY DAY 90 tablet 1   lidocaine (XYLOCAINE) 5 % ointment Apply 1 Application topically 3 (three) times daily as needed for mild pain or moderate pain. 35.44 g 3   lidocaine-prilocaine (EMLA) cream Apply 1 Application topically as needed. 30 g 2   loperamide (IMODIUM A-D) 2 MG tablet Take 4 mg by mouth 4 (four) times daily as needed for diarrhea or loose stools.     mirtazapine (REMERON SOL-TAB) 30 MG disintegrating tablet TAKE 1 TABLET BY MOUTH AT BEDTIME. 90 tablet 1   Multiple Vitamins-Minerals (PRESERVISION AREDS 2) CAPS Take 1 capsule by mouth 2 (two) times daily.     OVER THE COUNTER MEDICATION Take 1 tablet by mouth daily. PROBIOTICS     OVER THE COUNTER MEDICATION Fiber and spice pt take every morning     OVER THE COUNTER MEDICATION Juce pt takes every morning     pantoprazole (PROTONIX) 40 MG tablet Take 1 tablet (40 mg total) by mouth 2 (two) times daily. 60 tablet 3   prochlorperazine (COMPAZINE) 10 MG tablet Take 1 tablet (10 mg total) by mouth every 6 (six) hours as needed for nausea or vomiting. 30 tablet 0   No current facility-administered medications for this visit.    VITAL SIGNS: There were no vitals taken for this visit. There were no vitals filed for this visit.  Estimated body mass index is 18.69 kg/m as calculated from the following:   Height as of 05/21/23: 5\' 11"  (1.803 m).   Weight as of 05/21/23: 134 lb (60.8 kg).   PERFORMANCE STATUS (ECOG) : 1 - Symptomatic but completely ambulatory   Physical Exam General: NAD,  Pulmonary: normal breathing pattern  Skin: no rashes Neurological: AAO x4  Discussed the use of AI scribe software for clinical note transcription with the patient, who gave verbal consent to proceed.  IMPRESSION:  Mr.  [name] presents to clinic for follow-up. He has been doing well overall. Tolerating treatments. Taking things one day  at a time. Denies concerns of nausea, vomiting, or diarrhea. However, he has been experiencing ongoing constipation. He is taking daily iron supplements. Was seen by Dr. Arbutus Ped today with recommendations to start taking every other day. Additionally, he was instructed to take vitamin C to enhance iron absorption. We discussed daily use of vitamin C including orange juice with his ferrous sulfate.   He is currently taking pantoprazole and requesting refill. Denies pain or discomfort. We discussed all medications and advised on continuation in addition to dosages needed.   All questions answered and support provided.  Goals of care  8/27- Jesse [name] and his wife are realistic in their understanding regarding his cancer and recent progression. He is clear in his expressed wishes to continue to treat the treatable remaining hopeful for stability. His  wife is hopeful that he is able to tolerate treatment. Their goal is to closely watch his quality of life ensuring he is doing as well as he can. Jesse [name] is emotional stating he knows if he does nothing he will "die" and he is not ready to "give up" yet. Support provided.    4/9- Jesse [name] wishes to be a FULL CODE and pursue treatments that can improve his level of health and functioning. He has HCPOA listing his wife as Management consultant, and Living Will documents on file. Patient and wife interested in MOST form to help consolidate wishes onto one form that could be honored by medical providers/EMS in the event of an emergency. They are taking form home for review and to decide if they would like to complete at next visit  I discussed the importance of continued conversation with family and their medical providers regarding overall plan of care and treatment options, ensuring decisions are within the context of the patients values and GOCs.  Assessment and Plan  Gastroesophageal Reflux Disease (GERD) Patient has been taking Pantoprazole (Protonix) twice  daily. Feels this is effective.  -Continue Pantoprazole as prescribed. -Refill Pantoprazole prescription and send to [facility].  Iron Deficiency Anemia Patient has been taking Ferrous Sulfate twice daily but is experiencing constipation. Doctor Gwenyth Bouillon recommended taking iron every other day and adding Vitamin C to aid absorption. -Adjust Ferrous Sulfate to two tablets every other day. -Add Vitamin C 120mg  daily to aid iron absorption. -Monitor for constipation and adjust regimen as needed.  Follow-up in 6-8 weeks. Sooner if needed.  Patient expressed understanding and was in agreement with this plan. He also understands that He can call the clinic at any time with any questions, concerns, or complaints.    Visit consisted of counseling and education dealing with the complex and emotionally intense issues of symptom management and palliative care in the setting of serious and potentially life-threatening illness.  Willette Alma, AGPCNP-BC  Palliative Medicine Team/[facility]

## 2023-05-28 ENCOUNTER — Encounter: Payer: Self-pay | Admitting: Nurse Practitioner

## 2023-05-28 ENCOUNTER — Inpatient Hospital Stay: Payer: Medicare Other

## 2023-05-28 ENCOUNTER — Inpatient Hospital Stay: Payer: Medicare Other | Attending: Internal Medicine | Admitting: Internal Medicine

## 2023-05-28 ENCOUNTER — Inpatient Hospital Stay (HOSPITAL_BASED_OUTPATIENT_CLINIC_OR_DEPARTMENT_OTHER): Payer: Medicare Other | Admitting: Nurse Practitioner

## 2023-05-28 ENCOUNTER — Other Ambulatory Visit: Payer: Medicare Other

## 2023-05-28 VITALS — HR 88

## 2023-05-28 VITALS — BP 138/84 | HR 101 | Temp 98.3°F | Resp 18 | Wt 135.0 lb

## 2023-05-28 DIAGNOSIS — Z9221 Personal history of antineoplastic chemotherapy: Secondary | ICD-10-CM | POA: Insufficient documentation

## 2023-05-28 DIAGNOSIS — Z5111 Encounter for antineoplastic chemotherapy: Secondary | ICD-10-CM | POA: Diagnosis not present

## 2023-05-28 DIAGNOSIS — Z902 Acquired absence of lung [part of]: Secondary | ICD-10-CM | POA: Insufficient documentation

## 2023-05-28 DIAGNOSIS — C3431 Malignant neoplasm of lower lobe, right bronchus or lung: Secondary | ICD-10-CM

## 2023-05-28 DIAGNOSIS — C771 Secondary and unspecified malignant neoplasm of intrathoracic lymph nodes: Secondary | ICD-10-CM | POA: Insufficient documentation

## 2023-05-28 DIAGNOSIS — Z515 Encounter for palliative care: Secondary | ICD-10-CM | POA: Diagnosis not present

## 2023-05-28 DIAGNOSIS — D509 Iron deficiency anemia, unspecified: Secondary | ICD-10-CM | POA: Insufficient documentation

## 2023-05-28 DIAGNOSIS — R530 Neoplastic (malignant) related fatigue: Secondary | ICD-10-CM

## 2023-05-28 DIAGNOSIS — Z95828 Presence of other vascular implants and grafts: Secondary | ICD-10-CM | POA: Insufficient documentation

## 2023-05-28 DIAGNOSIS — R1013 Epigastric pain: Secondary | ICD-10-CM

## 2023-05-28 DIAGNOSIS — R911 Solitary pulmonary nodule: Secondary | ICD-10-CM

## 2023-05-28 DIAGNOSIS — C49A5 Gastrointestinal stromal tumor of rectum: Secondary | ICD-10-CM

## 2023-05-28 DIAGNOSIS — C3412 Malignant neoplasm of upper lobe, left bronchus or lung: Secondary | ICD-10-CM | POA: Insufficient documentation

## 2023-05-28 DIAGNOSIS — Z923 Personal history of irradiation: Secondary | ICD-10-CM | POA: Insufficient documentation

## 2023-05-28 DIAGNOSIS — C3492 Malignant neoplasm of unspecified part of left bronchus or lung: Secondary | ICD-10-CM

## 2023-05-28 LAB — CMP (CANCER CENTER ONLY)
ALT: 52 U/L — ABNORMAL HIGH (ref 0–44)
AST: 38 U/L (ref 15–41)
Albumin: 3.9 g/dL (ref 3.5–5.0)
Alkaline Phosphatase: 129 U/L — ABNORMAL HIGH (ref 38–126)
Anion gap: 7 (ref 5–15)
BUN: 30 mg/dL — ABNORMAL HIGH (ref 8–23)
CO2: 28 mmol/L (ref 22–32)
Calcium: 9.9 mg/dL (ref 8.9–10.3)
Chloride: 103 mmol/L (ref 98–111)
Creatinine: 0.61 mg/dL (ref 0.61–1.24)
GFR, Estimated: >60 mL/min (ref >60)
Glucose, Bld: 129 mg/dL — ABNORMAL HIGH (ref 70–99)
Potassium: 4 mmol/L (ref 3.5–5.1)
Sodium: 138 mmol/L (ref 135–145)
Total Bilirubin: 0.4 mg/dL (ref 0.0–1.2)
Total Protein: 7.5 g/dL (ref 6.5–8.1)

## 2023-05-28 LAB — CBC WITH DIFFERENTIAL (CANCER CENTER ONLY)
Abs Immature Granulocytes: 0.02 10*3/uL (ref 0.00–0.07)
Basophils Absolute: 0 10*3/uL (ref 0.0–0.1)
Basophils Relative: 0 %
Eosinophils Absolute: 0 10*3/uL (ref 0.0–0.5)
Eosinophils Relative: 0 %
HCT: 28.6 % — ABNORMAL LOW (ref 39.0–52.0)
Hemoglobin: 9.4 g/dL — ABNORMAL LOW (ref 13.0–17.0)
Immature Granulocytes: 0 %
Lymphocytes Relative: 11 %
Lymphs Abs: 0.6 10*3/uL — ABNORMAL LOW (ref 0.7–4.0)
MCH: 31.3 pg (ref 26.0–34.0)
MCHC: 32.9 g/dL (ref 30.0–36.0)
MCV: 95.3 fL (ref 80.0–100.0)
Monocytes Absolute: 0.2 10*3/uL (ref 0.1–1.0)
Monocytes Relative: 4 %
Neutro Abs: 4.3 10*3/uL (ref 1.7–7.7)
Neutrophils Relative %: 85 %
Platelet Count: 255 10*3/uL (ref 150–400)
RBC: 3 MIL/uL — ABNORMAL LOW (ref 4.22–5.81)
RDW: 15.7 % — ABNORMAL HIGH (ref 11.5–15.5)
WBC Count: 5 10*3/uL (ref 4.0–10.5)
nRBC: 0 % (ref 0.0–0.2)

## 2023-05-28 MED ORDER — HEPARIN SOD (PORK) LOCK FLUSH 100 UNIT/ML IV SOLN
500.0000 [IU] | Freq: Once | INTRAVENOUS | Status: AC | PRN
  Administered 2023-05-28: 500 [IU]

## 2023-05-28 MED ORDER — SODIUM CHLORIDE 0.9% FLUSH
10.0000 mL | Freq: Once | INTRAVENOUS | Status: AC
  Administered 2023-05-28: 10 mL

## 2023-05-28 MED ORDER — SODIUM CHLORIDE 0.9% FLUSH
10.0000 mL | INTRAVENOUS | Status: DC | PRN
  Administered 2023-05-28: 10 mL

## 2023-05-28 MED ORDER — SODIUM CHLORIDE 0.9 % IV SOLN: INTRAVENOUS | Status: DC

## 2023-05-28 MED ORDER — SODIUM CHLORIDE 0.9 % IV SOLN
500.0000 mg/m2 | Freq: Once | INTRAVENOUS | Status: AC
  Administered 2023-05-28: 900 mg via INTRAVENOUS
  Filled 2023-05-28: qty 20

## 2023-05-28 MED ORDER — PROCHLORPERAZINE MALEATE 10 MG PO TABS
10.0000 mg | ORAL_TABLET | Freq: Once | ORAL | Status: AC
  Administered 2023-05-28: 10 mg via ORAL
  Filled 2023-05-28: qty 1

## 2023-05-28 MED ORDER — PANTOPRAZOLE SODIUM 40 MG PO TBEC: 40.0000 mg | DELAYED_RELEASE_TABLET | Freq: Two times a day (BID) | ORAL | 6 refills | Status: DC

## 2023-05-28 NOTE — Patient Instructions (Signed)
[facility] - A DEPT OF [facility]  Discharge Instructions: Thank you for choosing [facility] to provide your oncology and hematology care.   If you have a lab appointment with the Cancer Center, please go directly to the Cancer Center and check in at the registration area.   Wear comfortable clothing and clothing appropriate for easy access to any Portacath or PICC line.   We strive to give you quality time with your provider. You may need to reschedule your appointment if you arrive late (15 or more minutes).  Arriving late affects you and other patients whose appointments are after yours.  Also, if you miss three or more appointments without notifying the office, you may be dismissed from the clinic at the provider's discretion.      For prescription refill requests, have your pharmacy contact our office and allow 72 hours for refills to be completed.    Today you received the following chemotherapy and/or immunotherapy agents Alimta      To help prevent nausea and vomiting after your treatment, we encourage you to take your nausea medication as directed.  BELOW ARE SYMPTOMS THAT SHOULD BE REPORTED IMMEDIATELY: *FEVER GREATER THAN 100.4 F (38 C) OR HIGHER *CHILLS OR SWEATING *NAUSEA AND VOMITING THAT IS NOT CONTROLLED WITH YOUR NAUSEA MEDICATION *UNUSUAL SHORTNESS OF BREATH *UNUSUAL BRUISING OR BLEEDING *URINARY PROBLEMS (pain or burning when urinating, or frequent urination) *BOWEL PROBLEMS (unusual diarrhea, constipation, pain near the anus) TENDERNESS IN MOUTH AND THROAT WITH OR WITHOUT PRESENCE OF ULCERS (sore throat, sores in mouth, or a toothache) UNUSUAL RASH, SWELLING OR PAIN  UNUSUAL VAGINAL DISCHARGE OR ITCHING   Items with * indicate a potential emergency and should be followed up as soon as possible or go to the Emergency Department if any problems should occur.  Please show the CHEMOTHERAPY ALERT CARD or IMMUNOTHERAPY  ALERT CARD at check-in to the Emergency Department and triage nurse.  Should you have questions after your visit or need to cancel or reschedule your appointment, please contact [facility] - A DEPT OF [facility]  Dept: [phone]  and follow the prompts.  Office hours are 8:00 a.m. to 4:30 p.m. Monday - Friday. Please note that voicemails left after 4:00 p.m. may not be returned until the following business day.  We are closed weekends and major holidays. You have access to a nurse at all times for urgent questions. Please call the main number to the clinic Dept: [phone] and follow the prompts.   For any non-urgent questions, you may also contact your provider using MyChart. We now offer e-Visits for anyone 79 and older to request care online for non-urgent symptoms. For details visit [email or web address].   Also download the MyChart app! Go to the app store, search "MyChart", open the app, select [place], and log in with your MyChart username and password.

## 2023-05-28 NOTE — Progress Notes (Signed)
[facility] [phone]   [phone]  OFFICE PROGRESS NOTE  Jesse Saint, MD [address] [place] [address]  DIAGNOSIS:  1) recurrent non-small cell lung cancer, adenocarcinoma with positive mediastinal lymph node in December 2023 that was initially diagnosed as stage IIB (T1 a, N1, M0) non-small cell lung cancer, adenocarcinoma diagnosed in June 2021.  The patient also has evidence for recurrent disease in the mediastinal lymph node in January 2024 and in the right lower lobe in August 2024. 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  Biomarker Findings Tumor Mutational Burden - 20 Muts/Mb Microsatellite status - MS-Stable Genomic Findings For a complete list of the genes assayed, please refer to the Appendix. KEAP1 I415fs*17 DNMT3A E817* RB1 splice site 1960+5G>C TP53 R209* 8 Disease relevant genes with no reportable alterations: ALK, BRAF, EGFR, ERBB2, KRAS, MET, RET, ROS1  PDL1 Expression: 1 %  PRIOR THERAPY:  1) status post left upper lobectomy with lymph node dissection on December 16, 2019 under the care of Dr. Cliffton Asters.  The tumor size measured 0.9 cm but there was involvement of the level 10 L and 11 L.  2) Adjuvant systemic chemotherapy with cisplatin 75 mg/M2 and Alimta 500 mg/M2 every 3 weeks.  First dose February 04, 2020.  Status post 4 cycles. 3) Neoadjuvant treatment with imatinib 400 mg p.o. daily for the GIST tumor of the pelvic area.  First dose started July 18, 2020.  Status post 12 months of treatment. 4) status post robotic assisted very low anterior rectosigmoid resection with coloanal anastomosis, diverting loop ileostomy and wedge liver biopsy under the care of Dr. Michaell Cowing on December 14, 2021 and it showed minimal residual gastrointestinal stromal tumor. 5) concurrent chemoradiation with weekly carboplatin for AUC of 2 and paclitaxel 45 Mg/M2.  First dose May 14, 2022. 6) Imatinib 400 mg p.o.  daily 7) Systemic chemotherapy with carboplatin for AUC of 5 and Alimta 500 Mg/M2 every 3 weeks.  First dose 12/25/2022.  Status post 6 cycles.  CURRENT THERAPY: Maintenance treatment with single agent Alimta 500 Mg/M2, status post 1 cycle.  INTERVAL HISTORY: Jesse [name] 73 y.o. male returns to the clinic today for follow-up visit accompanied by his wife. Discussed the use of AI scribe software for clinical note transcription with the patient, who gave verbal consent to proceed.  History of Present Illness   Jesse [name] "Jesse [name]" is a 73 year old male with recurrent non-small cell lung cancer adenocarcinoma who presents for chemotherapy treatment. He is accompanied by his wife.  He has a history of recurrent non-small cell lung cancer adenocarcinoma, initially diagnosed as stage 2B in June 2021, for which he underwent surgical resection of the left upper lobe. Recurrences occurred in December 2023, January 2024, and most recently in August 2024. Following the latest recurrence, he was started on chemotherapy with six cycles of carboplatin and Alimta, and is currently on maintenance Alimta, having completed one cycle with today being the second cycle.  He also has a history of a gastrointestinal stromal tumor of the pelvis, which was treated with imatinib and surgery. Post-surgery, he experiences persistent issues with bowel movements, describing 'urgency' rather than diarrhea, with stools often being hard and small. He has consulted with a gastroenterologist regarding these issues.  He has mild anemia, which he hopes will improve with the current single-agent chemotherapy regimen. He is not currently taking any iron supplements.        MEDICAL  HISTORY: Past Medical History:  Diagnosis Date   Anemia    Cancer (HCC)    bladder cancer   COPD (chronic obstructive pulmonary disease) (HCC)    mild    DIVERTICULOSIS, COLON 01/14/2007   Qualifier: Diagnosis of  By: Marcelyn Ditty RN, Katy Fitch     GASTRITIS, CHRONIC 06/01/2004   Qualifier: Diagnosis of  By: Creta Levin CMA (AAMA), Robin     GERD 01/09/2007   Qualifier: Diagnosis of  By: Tawanna Cooler, RN, Ellen     Heart murmur    as a child; no murmur heard 12/14/19   History of radiation therapy    Mediastinum- 05/15/22-06/28/22-Dr. Antony Blackbird   HYPERLIPIDEMIA 01/09/2007   Qualifier: Diagnosis of  By: Everett Graff     Hypertension    Lung mass    left upper nodule   Macular degeneration    right eye   NEOPLASM, MALIGNANT, BLADDER, HX OF 2002   Qualifier: Diagnosis of  By: Creta Levin CMA (AAMA), Robin  / no chemo or radiation   OSTEOARTHRITIS 01/14/2007   Qualifier: Diagnosis of  By: Marcelyn Ditty, RN, Katy Fitch - knees   Rectal mass    Reflux esophagitis 06/01/2004   Qualifier: Diagnosis of  By: Creta Levin CMA Duncan Dull), Robin     Stroke [facility])    2024   TOBACCO USER 02/16/2009   Qualifier: Diagnosis of  By: Amador Cunas  MD, Janett Labella    VITAMIN D DEFICIENCY 06/03/2007   Qualifier: Diagnosis of  By: Amador Cunas  MD, Janett Labella     ALLERGIES:  has no known allergies.  MEDICATIONS:  Current Outpatient Medications  Medication Sig Dispense Refill   acetaminophen (TYLENOL) 500 MG tablet Take 1,000 mg by mouth every 8 (eight) hours as needed for moderate pain.     aspirin EC 81 MG tablet Take 81 mg by mouth daily. Swallow whole.     atorvastatin (LIPITOR) 20 MG tablet Take 1 tablet (20 mg total) by mouth daily. 90 tablet 3   Cholecalciferol (VITAMIN D3 PO) Take 2,000 Units by mouth daily.     dexamethasone (DECADRON) 4 MG tablet Please take 1 tablet twice a day the day before, the day of, and the day after chemotherapy 60 tablet 0   diphenoxylate-atropine (LOMOTIL) 2.5-0.025 MG tablet Take 2 tablets by mouth 3 (three) times daily as needed for diarrhea or loose stools. 90 tablet 0   ferrous sulfate 325 (65 FE) MG EC tablet Take 1 tablet (325 mg total) by mouth 2 (two) times daily. 60 tablet 1   folic acid (FOLVITE) 1 MG tablet TAKE 1 TABLET BY  MOUTH EVERY DAY 90 tablet 1   lidocaine (XYLOCAINE) 5 % ointment Apply 1 Application topically 3 (three) times daily as needed for mild pain or moderate pain. 35.44 g 3   lidocaine-prilocaine (EMLA) cream Apply 1 Application topically as needed. 30 g 2   loperamide (IMODIUM A-D) 2 MG tablet Take 4 mg by mouth 4 (four) times daily as needed for diarrhea or loose stools.     mirtazapine (REMERON SOL-TAB) 30 MG disintegrating tablet TAKE 1 TABLET BY MOUTH AT BEDTIME. 90 tablet 1   Multiple Vitamins-Minerals (PRESERVISION AREDS 2) CAPS Take 1 capsule by mouth 2 (two) times daily.     OVER THE COUNTER MEDICATION Take 1 tablet by mouth daily. PROBIOTICS     OVER THE COUNTER MEDICATION Fiber and spice pt take every morning     OVER THE COUNTER MEDICATION Juce pt takes every morning  pantoprazole (PROTONIX) 40 MG tablet Take 1 tablet (40 mg total) by mouth 2 (two) times daily. 60 tablet 3   prochlorperazine (COMPAZINE) 10 MG tablet Take 1 tablet (10 mg total) by mouth every 6 (six) hours as needed for nausea or vomiting. 30 tablet 0   No current facility-administered medications for this visit.    SURGICAL HISTORY:  Past Surgical History:  Procedure Laterality Date   BIOPSY  06/18/2022   Procedure: BIOPSY;  Surgeon: Shellia Cleverly, DO;  Location: [facility];  Service: Gastroenterology;;   BREAST BIOPSY Left 03/16/2022   [place] LT RADIOACTIVE SEED LOC 03/16/2022 [facility]   BRONCHIAL BIOPSY  10/13/2019   Procedure: BRONCHIAL BIOPSIES;  Surgeon: Josephine Igo, DO;  [facility]: [facility];  Service: Pulmonary;;   BRONCHIAL BIOPSY  12/04/2022   Procedure: BRONCHIAL BIOPSIES;  Surgeon: Josephine Igo, DO;  [facility];  Service: Pulmonary;;   BRONCHIAL BRUSHINGS  10/13/2019   Procedure: BRONCHIAL BRUSHINGS;  Surgeon: Josephine Igo, DO;  [facility];  Service: Pulmonary;;   BRONCHIAL NEEDLE ASPIRATION BIOPSY  10/13/2019   Procedure: BRONCHIAL NEEDLE ASPIRATION  BIOPSIES;  Surgeon: Josephine Igo, DO;  [facility];  Service: Pulmonary;;   BRONCHIAL NEEDLE ASPIRATION BIOPSY  04/11/2022   Procedure: BRONCHIAL NEEDLE ASPIRATION BIOPSIES;  Surgeon: Josephine Igo, DO;  [facility]: [facility];  Service: Cardiopulmonary;;   BRONCHIAL NEEDLE ASPIRATION BIOPSY  12/04/2022   Procedure: BRONCHIAL NEEDLE ASPIRATION BIOPSIES;  Surgeon: Josephine Igo, DO;  [facility];  Service: Pulmonary;;   BRONCHIAL WASHINGS  10/13/2019   Procedure: BRONCHIAL WASHINGS;  Surgeon: Josephine Igo, DO;  [facility];  Service: Pulmonary;;   BRONCHIAL WASHINGS  12/04/2022   Procedure: BRONCHIAL WASHINGS;  Surgeon: Josephine Igo, DO;  [facility];  Service: Pulmonary;;   COLONOSCOPY     multiple   DIVERTING ILEOSTOMY N/A 12/14/2021   Procedure: DIVERTING ILEOSTOMY;  Surgeon: Karie Soda, MD;  Location: [facility];  Service: General;  Laterality: N/A;   ENDOBRONCHIAL ULTRASOUND Bilateral 04/11/2022   Procedure: ENDOBRONCHIAL ULTRASOUND;  Surgeon: Josephine Igo, DO;  [facility];  Service: Cardiopulmonary;  Laterality: Bilateral;   ESOPHAGEAL DILATION  06/18/2022   Procedure: ESOPHAGEAL DILATION;  Surgeon: Shellia Cleverly, DO;  [facility];  Service: Gastroenterology;;   ESOPHAGOGASTRODUODENOSCOPY (EGD) WITH PROPOFOL N/A 06/18/2022   Procedure: ESOPHAGOGASTRODUODENOSCOPY (EGD) WITH PROPOFOL;  Surgeon: Shellia Cleverly, DO;  [facility];  Service: Gastroenterology;  Laterality: N/A;  Severe dysphagia/odynophagia ?radiation esophagitis versus oral candidiasis   FIDUCIAL MARKER PLACEMENT  12/04/2022   Procedure: FIDUCIAL MARKER PLACEMENT;  Surgeon: Josephine Igo, DO;  [facility];  Service: Pulmonary;;   ILEOSTOMY CLOSURE N/A 03/22/2022   Procedure: OPEN TAKEDOWN OF LOOP ILEOSTOMY;  Surgeon: Karie Soda, MD;  Location: [facility];  Service: General;  Laterality: N/A;  GEN w/ERAS PATHWAY LOCAL    INTERCOSTAL NERVE BLOCK Left 12/16/2019   Procedure: INTERCOSTAL NERVE BLOCK;  Surgeon: Corliss Skains, MD;  [facility]: [facility];  Service: Thoracic;  Laterality: Left;   IR FLUORO RM 30-60 MIN  07/02/2022   IR IMAGING GUIDED PORT INSERTION  05/21/2023   KNEE SURGERY  07/20/2008   bilat.   NASAL SEPTUM SURGERY  1995   NODE DISSECTION Left 12/16/2019   Procedure: NODE DISSECTION;  Surgeon: Corliss Skains, MD;  [facility];  Service: Thoracic;  Laterality: Left;   PEG PLACEMENT N/A 07/04/2022   Procedure: PERCUTANEOUS ENDOSCOPIC GASTROSTOMY (  PEG) PLACEMENT;  Surgeon: Iva Boop, MD;  Location: [facility];  Service: Gastroenterology;  Laterality: N/A;   RADIOACTIVE SEED GUIDED AXILLARY SENTINEL LYMPH NODE Left 03/20/2022   Procedure: RADIOACTIVE SEED GUIDED LEFT AXILLARY SENTINEL LYMPH NODE BIOPSY;  Surgeon: Harriette Bouillon, MD;  [facility];  Service: General;  Laterality: Left;   RECTAL EXAM UNDER ANESTHESIA N/A 03/22/2022   Procedure: ANORECTAL EXAMINATION UNDER ANESTHESIA;  Surgeon: Karie Soda, MD;  Location: [facility];  Service: General;  Laterality: N/A;   TONSILLECTOMY     TRANSURETHRAL RESECTION OF BLADDER TUMOR  2002   UPPER GASTROINTESTINAL ENDOSCOPY     VIDEO BRONCHOSCOPY  04/11/2022   Procedure: VIDEO BRONCHOSCOPY;  Surgeon: Josephine Igo, DO;  [facility]: [facility];  Service: Cardiopulmonary;;   VIDEO BRONCHOSCOPY WITH ENDOBRONCHIAL NAVIGATION N/A 10/13/2019   Procedure: VIDEO BRONCHOSCOPY WITH ENDOBRONCHIAL NAVIGATION;  Surgeon: Josephine Igo, DO;  [facility];  Service: Pulmonary;  Laterality: N/A;   WISDOM TOOTH EXTRACTION     XI ROBOTIC ASSISTED LOWER ANTERIOR RESECTION N/A 12/14/2021   Procedure: XI ROBOTIC ASSISTED LOWER ANTERIOR ULTRA LOW RECTOSIGMOID RESECTION, COLOANAL HAND SEWN ANASTOMOSIS, AND BILATERAL TAP BLOCK;  Surgeon: Karie Soda, MD;  Location: [facility];  Service: General;  Laterality: N/A;    REVIEW OF SYSTEMS:  Constitutional:  positive for fatigue Eyes: negative Ears, nose, mouth, throat, and face: negative Respiratory: negative Cardiovascular: negative Gastrointestinal: positive for urgency for bowel movement Genitourinary:negative Integument/breast: negative Hematologic/lymphatic: negative Musculoskeletal:negative Neurological: negative Behavioral/Psych: negative Endocrine: negative Allergic/Immunologic: negative   PHYSICAL EXAMINATION: General appearance: alert, cooperative, fatigued, and no distress Head: Normocephalic, without obvious abnormality, atraumatic Neck: no adenopathy, no JVD, supple, symmetrical, trachea midline, and thyroid not enlarged, symmetric, no tenderness/mass/nodules Lymph nodes: Cervical, supraclavicular, and axillary nodes normal. Resp: clear to auscultation bilaterally Back: symmetric, no curvature. ROM normal. No CVA tenderness. Cardio: regular rate and rhythm, S1, S2 normal, no murmur, click, rub or gallop GI: soft, non-tender; bowel sounds normal; no masses,  no organomegaly Extremities: extremities normal, atraumatic, no cyanosis or edema Neurologic: Alert and oriented X 3, normal strength and tone. Normal symmetric reflexes. Normal coordination and gait  ECOG PERFORMANCE STATUS: 1 - Symptomatic but completely ambulatory  Blood pressure 138/84, pulse (!) 101, temperature 98.3 F (36.8 C), temperature source Temporal, resp. rate 18, weight 135 lb (61.2 kg), SpO2 99%.  LABORATORY DATA: Lab Results  Component Value Date   WBC 5.0 05/28/2023   HGB 9.4 (L) 05/28/2023   HCT 28.6 (L) 05/28/2023   MCV 95.3 05/28/2023   PLT 255 05/28/2023      Chemistry      Component Value Date/Time   NA 138 05/28/2023 0929   K 4.0 05/28/2023 0929   CL 103 05/28/2023 0929   CO2 28 05/28/2023 0929   BUN 30 (H) 05/28/2023 0929   CREATININE 0.61 05/28/2023 0929      Component Value Date/Time   CALCIUM 9.9 05/28/2023 0929   ALKPHOS 129 (H) 05/28/2023 0929   AST 38 05/28/2023 0929    ALT 52 (H) 05/28/2023 0929   BILITOT 0.4 05/28/2023 0929       RADIOGRAPHIC STUDIES: IR IMAGING GUIDED PORT INSERTION Result Date: 05/21/2023 CLINICAL DATA:  History of recurrent metastatic lung adenocarcinoma and need for porta cath for continued chemotherapy. EXAM: IMPLANTED PORT A CATH PLACEMENT WITH ULTRASOUND AND FLUOROSCOPIC GUIDANCE ANESTHESIA/SEDATION: Moderate (conscious) sedation was employed during this procedure. A total of Versed 2.0 mg and Fentanyl 100 mcg was administered intravenously. Moderate Sedation Time:  38 minutes. The patient's level of consciousness and vital signs were monitored continuously by radiology nursing throughout the procedure under my direct supervision. FLUOROSCOPY: 1 minute and 12 seconds.  6.0 mGy. PROCEDURE: The procedure, risks, benefits, and alternatives were explained to the patient. Questions regarding the procedure were encouraged and answered. The patient understands and consents to the procedure. A time-out was performed prior to initiating the procedure. Ultrasound was utilized to confirm patency of the right internal jugular vein. Under ultrasound image was saved and recorded. The right neck and chest were prepped with chlorhexidine in a sterile fashion, and a sterile drape was applied covering the operative field. Maximum barrier sterile technique with sterile gowns and gloves were used for the procedure. Local anesthesia was provided with 1% lidocaine. After creating a small venotomy incision, a 21 gauge needle was advanced into the right internal jugular vein under direct, real-time ultrasound guidance. Ultrasound image documentation was performed. After securing guidewire access, an 8 Fr dilator was placed. A J-wire was kinked to measure appropriate catheter length. A subcutaneous port pocket was then created along the upper chest wall utilizing sharp and blunt dissection. Portable cautery was utilized. The pocket was irrigated with sterile saline. A single  lumen power injectable port was chosen for placement. The 8 Fr catheter was tunneled from the port pocket site to the venotomy incision. The port was placed in the pocket. External catheter was trimmed to appropriate length based on guidewire measurement. At the venotomy, an 8 Fr peel-away sheath was placed over a guidewire. The catheter was then placed through the sheath and the sheath removed. Final catheter positioning was confirmed and documented with a fluoroscopic spot image. The port was accessed with a needle and aspirated and flushed with heparinized saline. The access needle was removed. The venotomy and port pocket incisions were closed with subcutaneous 3-0 Monocryl and subcuticular 4-0 Vicryl. Dermabond was applied to both incisions. COMPLICATIONS: COMPLICATIONS None FINDINGS: After catheter placement, the tip lies at the cavo-atrial junction. The catheter aspirates normally and is ready for immediate use. IMPRESSION: Placement of single lumen port a cath via right internal jugular vein. The catheter tip lies at the cavo-atrial junction. A power injectable port a cath was placed and is ready for immediate use. Electronically Signed   By: Irish Lack M.D.   On: 05/21/2023 12:24   CT CHEST ABDOMEN PELVIS W CONTRAST Result Date: 04/30/2023 CLINICAL DATA:  Metastatic non-small cell lung cancer restaging * Tracking Code: BO * EXAM: CT CHEST, ABDOMEN, AND PELVIS WITH CONTRAST TECHNIQUE: Multidetector CT imaging of the chest, abdomen and pelvis was performed following the standard protocol during bolus administration of intravenous contrast. RADIATION DOSE REDUCTION: This exam was performed according to the departmental dose-optimization program which includes automated exposure control, adjustment of the mA and/or kV according to patient size and/or use of iterative reconstruction technique. CONTRAST:  OMNIPAQUE IOHEXOL 300 MG/ML  SOLN COMPARISON:  02/26/2023 FINDINGS: CT CHEST FINDINGS  Cardiovascular: Aortic atherosclerosis. Normal heart size. Three-vessel coronary artery calcifications. No pericardial effusion. Mediastinum/Nodes: No enlarged mediastinal, hilar, or axillary lymph nodes. Thyroid gland, trachea, and esophagus demonstrate no significant findings. Lungs/Pleura: Status post left upper lobectomy. Unchanged postoperative and post treatment appearance of the chest status post left upper lobectomy with paramedian radiation fibrosis. Multiple small pulmonary nodules not significantly changed, for example a 0.6 cm nodule of the right apex (series 4, image 25) and a 1.0 cm nodule of the anterior right upper lobe (series 4, image 39). Small,  loculated left pleural effusion. Interval resolution of previously seen left pneumothorax component. Mild centrilobular emphysema. Musculoskeletal: No chest wall abnormality. No acute osseous findings. CT ABDOMEN PELVIS FINDINGS Hepatobiliary: No solid liver abnormality is seen. No gallstones, gallbladder wall thickening, or biliary dilatation. Pancreas: Unremarkable. No pancreatic ductal dilatation or surrounding inflammatory changes. Spleen: Normal in size without significant abnormality. Adrenals/Urinary Tract: Unchanged left adrenal nodule measuring 1.4 x 1.4 cm (series 2, image 61). Nonobstructive calculus of the midportion of the right kidney. No left-sided calculi, ureteral calculi, or hydronephrosis. Bladder is unremarkable. Stomach/Bowel: Percutaneous gastrostomy. Appendix appears normal. No evidence of bowel wall thickening, distention, or inflammatory changes. Vascular/Lymphatic: Severe aortic atherosclerosis. No enlarged abdominal or pelvic lymph nodes. Reproductive: Prostatomegaly. Other: No abdominal wall hernia or abnormality. No ascites. Musculoskeletal: No acute osseous findings. IMPRESSION: 1. Unchanged postoperative and post treatment appearance of the chest status post left upper lobectomy with paramedian radiation fibrosis. 2. Multiple  small pulmonary nodules not significantly changed. 3. Small, loculated left pleural effusion. Interval resolution of previously seen left pneumothorax component. 4. Unchanged left adrenal nodule, nonspecific but statistically most likely an adenoma. Attention on follow-up. 5. No evidence of lymphadenopathy or metastatic disease in the abdomen or pelvis. 6. Emphysema. 7. Coronary artery disease. Aortic Atherosclerosis (ICD10-I70.0) and Emphysema (ICD10-J43.9). Electronically Signed   By: Jearld Lesch M.D.   On: 04/30/2023 21:57    ASSESSMENT AND PLAN: This is a very pleasant 73 years old white male recently diagnosed with a stage IIb (T1 a, N1, M0) non-small cell lung cancer, adenocarcinoma in June 2021 status post left upper lobectomy with lymph node dissection on December 16, 2019 under the care of Dr. Cliffton Asters.  The patient has no actionable mutations and PD-L1 expression was 1%. He was also diagnosed with pelvic GIST tumor. The patient completed a course of adjuvant systemic chemotherapy with cisplatin 75 mg/M2 and Alimta 500 mg/M2 every 3 weeks status post 4 cycles.  He tolerated his treatment well except for fatigue and occasional nausea. For the pelvic GIST tumor, the patient completed neoadjuvant treatment with imatinib 400 mg p.o. daily in April 2023.  Status post 24 months.   The patient underwent surgical resection of the remaining gastrointestinal stromal tumor under the care of Dr. Michaell Cowing on December 14, 2021 with very minimal residual disease.  He resumed his treatment with imatinib and tolerating it fairly well. Repeat CT scan of the chest for restaging of his lung cancer. There was increased density of soft tissue in the left hilar region concerning for possible developing lymphadenopathy or disease recurrence.  A PET scan was performed recently and that showed new hypermetabolic left axillary, mediastinal and left hilar adenopathy compatible with recurrent metastatic lung cancer no  hypermetabolic metastatic disease in the neck, abdomen, pelvis or skeleton. He underwent excisional biopsy of the left axillary lymph node that was not conclusive for malignancy. The patient was seen by Dr. Tonia Brooms and repeat video bronchoscopy with EBUS on April 11, 2022 and biopsy from the station 7 lymph node showed recurrent adenocarcinoma. I had a lengthy discussion with the patient today about his current condition and treatment options. I recommended for the patient a course of concurrent chemoradiation with weekly carboplatin for AUC of 2 and paclitaxel 45 Mg/M2 for 6-7 weeks.  Status post 3 cycles of chemotherapy but he completed the course of radiation.  The patient was admitted to the hospital on June 07, 2022 with significant sepsis and Klebsiella pneumonia bacteremia.  He continues to have persistent diarrhea  despite discontinuation of imatinib and treatment with cholestyramine.  This is likely functional diarrhea after his surgical resection for the pelvic GIST tumor. Repeat PET scan showed no concerning findings for disease progression except for suspicious slowly growing adenocarcinoma in the right lower lobe but inflammatory changes in the right lung as well as suspicious reactive mediastinal lymphadenopathy.  The patient had repeat bronchoscopy by Dr. Tonia Brooms and the final pathology was consistent with recurrent non-small cell lung cancer. The patient started systemic chemotherapy with carboplatin for AUC of 5 and Alimta 500 Mg/M2 on 12/25/2022.  Status post 6 cycles.  He is currently on maintenance treatment with single agent Alimta every 3 weeks status post 1 cycle.     Recurrent Non-Small Cell Lung Cancer (NSCLC) Adenocarcinoma Recurrent NSCLC adenocarcinoma, initially stage IIB, with surgical resection of the left upper lobe in June 2021. Recurrences in December 2023, January 2024, and August 2024. Currently on maintenance Alimta after six cycles of carboplatin and Alimta. Patient  reports no new complaints. Monitoring with scans every third cycle. Discussed maintenance therapy outcomes and potential to extend scan intervals if stable. - Administer cycle two of maintenance Alimta - Continue maintenance Alimta - Perform scans every third cycle to monitor disease progression  Post-Surgical Pelvic Pain Persistent pelvic pain post-GIST surgery treated with imatinib and resection. Pain associated with sphincter dysfunction. Patient dissatisfied with current surgical follow-up.  - Discuss sphincter issues with Dr. Leone Payor - Consider referral to a surgeon experienced in sphincter repair  Mild Anemia Mild anemia noted on recent labs. Hemoglobin expected to improve with single-agent maintenance therapy. Discussed iron supplements, potential constipation, and taking with vitamin C for better absorption. - Initiate over-the-counter iron supplement every other day - Take iron supplement with vitamin C - Monitor hemoglobin levels  Bowel Movement Urgency Reports urgency in bowel movements with hard, small stools. Hydration status may be contributing. Discussed increasing hydration to soften stools. - Encourage increased hydration to soften stools  Follow-up - Schedule follow-up after three cycles of maintenance Alimta.   The patient was advised to call immediately if he has any concerning symptoms in the interval.  The patient voices understanding of current disease status and treatment options and is in agreement with the current care plan.  All questions were answered. The patient knows to call the clinic with any problems, questions or concerns. We can certainly see the patient much sooner if necessary. The total time spent in the appointment was 30 minutes.  Disclaimer: This note was dictated with voice recognition software. Similar sounding words can inadvertently be transcribed and may not be corrected upon review.

## 2023-05-30 DIAGNOSIS — H40053 Ocular hypertension, bilateral: Secondary | ICD-10-CM | POA: Diagnosis not present

## 2023-05-30 DIAGNOSIS — H40013 Open angle with borderline findings, low risk, bilateral: Secondary | ICD-10-CM | POA: Diagnosis not present

## 2023-06-03 DIAGNOSIS — M5431 Sciatica, right side: Secondary | ICD-10-CM | POA: Diagnosis not present

## 2023-06-03 DIAGNOSIS — M9903 Segmental and somatic dysfunction of lumbar region: Secondary | ICD-10-CM | POA: Diagnosis not present

## 2023-06-04 ENCOUNTER — Inpatient Hospital Stay: Payer: Medicare Other

## 2023-06-04 ENCOUNTER — Telehealth: Payer: Self-pay | Admitting: Internal Medicine

## 2023-06-04 NOTE — Telephone Encounter (Signed)
[facility] [place] Clinical Research Coordinator note:   This visit for Subject Jesse [name] with DOB: [birth year] on 06/04/2023. Subject contacted regarding [id number] to inform that Principal Investigator has changed to Dr. Delton Coombes. Voicemail left requesting a return call.

## 2023-06-08 NOTE — Progress Notes (Signed)
 Updated pemetrexed ERX to [id number]   Pryor Ochoa, PharmD 06/08/23

## 2023-06-10 DIAGNOSIS — M5431 Sciatica, right side: Secondary | ICD-10-CM | POA: Diagnosis not present

## 2023-06-10 DIAGNOSIS — M9903 Segmental and somatic dysfunction of lumbar region: Secondary | ICD-10-CM | POA: Diagnosis not present

## 2023-06-11 ENCOUNTER — Inpatient Hospital Stay: Payer: Medicare Other

## 2023-06-12 ENCOUNTER — Encounter: Payer: Self-pay | Admitting: Family Medicine

## 2023-06-12 ENCOUNTER — Ambulatory Visit (INDEPENDENT_AMBULATORY_CARE_PROVIDER_SITE_OTHER): Payer: Medicare Other | Admitting: Family Medicine

## 2023-06-12 VITALS — BP 124/72 | HR 92 | Temp 98.1°F | Ht 71.0 in | Wt 135.8 lb

## 2023-06-12 DIAGNOSIS — C3490 Malignant neoplasm of unspecified part of unspecified bronchus or lung: Secondary | ICD-10-CM | POA: Diagnosis not present

## 2023-06-12 DIAGNOSIS — C49A5 Gastrointestinal stromal tumor of rectum: Secondary | ICD-10-CM | POA: Diagnosis not present

## 2023-06-12 DIAGNOSIS — C3492 Malignant neoplasm of unspecified part of left bronchus or lung: Secondary | ICD-10-CM | POA: Diagnosis not present

## 2023-06-12 DIAGNOSIS — G629 Polyneuropathy, unspecified: Secondary | ICD-10-CM

## 2023-06-12 DIAGNOSIS — R1114 Bilious vomiting: Secondary | ICD-10-CM | POA: Diagnosis not present

## 2023-06-12 DIAGNOSIS — Z23 Encounter for immunization: Secondary | ICD-10-CM

## 2023-06-12 MED ORDER — ONDANSETRON HCL 4 MG/2ML IJ SOLN
4.0000 mg | Freq: Once | INTRAMUSCULAR | Status: AC
  Administered 2023-06-12: 4 mg via INTRAMUSCULAR

## 2023-06-12 MED ORDER — GABAPENTIN 100 MG PO CAPS: 100.0000 mg | ORAL_CAPSULE | Freq: Every day | ORAL | 3 refills | Status: DC

## 2023-06-12 NOTE — Progress Notes (Signed)
 Established Patient Office Visit   Subjective  Patient ID: Jesse [name], male    DOB: 29-Jul-1950  Age: 73 y.o. 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  Patient states he is doing pretty good.  Currently on maintenance Alimta. Patient also under palliative care.  States initially had nausea/feeling bad after chemo but no longer having issues since taking prednisone prior chemo and for few days afterwards.  Patient still unable to gain weight.  Now with soreness, burning, tingling in bilateral feet x 2 months.  Sensation can be intermittent, typically worse at night waking him up.  Patient inquires about immunizations such as RSV, Tdap, and pneumonia.   Patient Active Problem List   Diagnosis Date Noted   Port-A-Cath in place 05/28/2023   Recurrent non-small cell lung cancer (HCC) 03/05/2023   Acute ischemic stroke, likely embolic 07/02/2022   Esophageal dysphagia 06/18/2022   Esophageal stricture 06/18/2022   Hiatal hernia 06/18/2022   Radiation-induced esophagitis 06/18/2022   Pancytopenia due to chemotherapy 06/07/2022   Protein-calorie malnutrition, severe 06/07/2022   Severe sepsis due to Klebsiella pneumonia and bacteremia 06/05/2022   AKI (acute kidney injury) (HCC) 06/05/2022   Hyponatremia 06/05/2022   Acute metabolic encephalopathy 06/05/2022   Acute respiratory failure with hypoxia (HCC) 06/05/2022   Acute on chronic diarrhea 06/05/2022   Weight loss 05/28/2022    Malignant neoplasm of lung (HCC) 04/30/2022   Adenopathy 04/06/2022   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   Encounter for antineoplastic chemotherapy 01/28/2020   Adenocarcinoma of left lung, stage 2 (HCC) 12/31/2019   Goals of care, counseling/discussion 12/31/2019   S/P lobectomy of lung 12/16/2019   Lung nodule 10/13/2019   Essential hypertension 09/28/2016   Coronary artery calcification 09/28/2016   Impaired glucose tolerance 09/28/2016   Vitamin D deficiency 06/03/2007   Diverticulosis of colon 01/14/2007   Dyslipidemia 01/09/2007   GERD 01/09/2007   REFLUX ESOPHAGITIS 06/01/2004   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   Past Medical History:  Diagnosis Date   Anemia    Cancer (HCC)    bladder cancer   COPD (chronic obstructive pulmonary disease) (HCC)    mild    DIVERTICULOSIS, COLON 01/14/2007   Qualifier: Diagnosis of  By: Marcelyn Ditty RN, Katy Fitch    GASTRITIS, CHRONIC 06/01/2004   Qualifier: Diagnosis of  By: Creta Levin CMA (AAMA), Robin     GERD 01/09/2007   Qualifier: Diagnosis of  By: Tawanna Cooler, RN, Ellen     Heart murmur    as a child; no murmur heard 12/14/19   History of radiation therapy    Mediastinum- 05/15/22-06/28/22-Dr. Antony Blackbird   HYPERLIPIDEMIA 01/09/2007   Qualifier: Diagnosis of  By: Everett Graff     Hypertension    Lung mass    left upper nodule   Macular degeneration    right eye   NEOPLASM, MALIGNANT, BLADDER, HX OF 2002   Qualifier: Diagnosis of  By: Creta Levin CMA (AAMA), Robin  / no  chemo or radiation   OSTEOARTHRITIS 01/14/2007   Qualifier: Diagnosis of  By: Marcelyn Ditty, RN, Katy Fitch - knees   Rectal mass    Reflux esophagitis 06/01/2004   Qualifier: Diagnosis of  By: Creta Levin CMA Duncan Dull), Robin     Stroke [facility])    2024   TOBACCO USER 02/16/2009   Qualifier: Diagnosis of  By: Amador Cunas  MD, Janett Labella    VITAMIN D DEFICIENCY 06/03/2007   Qualifier: Diagnosis of  By: Amador Cunas  MD, Janett Labella    Past Surgical  History:  Procedure Laterality Date   BIOPSY  06/18/2022   Procedure: BIOPSY;  Surgeon: Shellia Cleverly, DO;  Location: [facility];  Service: Gastroenterology;;   BREAST BIOPSY Left 03/16/2022   [place] LT RADIOACTIVE SEED LOC 03/16/2022 [facility]   BRONCHIAL BIOPSY  10/13/2019   Procedure: BRONCHIAL BIOPSIES;  Surgeon: Josephine Igo, DO;  Location: [facility];  Service: Pulmonary;;   BRONCHIAL BIOPSY  12/04/2022   Procedure: BRONCHIAL BIOPSIES;  Surgeon: Josephine Igo, DO;  [facility]: [facility];  Service: Pulmonary;;   BRONCHIAL BRUSHINGS  10/13/2019   Procedure: BRONCHIAL BRUSHINGS;  Surgeon: Josephine Igo, DO;  [facility]: [facility];  Service: Pulmonary;;   BRONCHIAL NEEDLE ASPIRATION BIOPSY  10/13/2019   Procedure: BRONCHIAL NEEDLE ASPIRATION BIOPSIES;  Surgeon: Josephine Igo, DO;  [facility]: [facility];  Service: Pulmonary;;   BRONCHIAL NEEDLE ASPIRATION BIOPSY  04/11/2022   Procedure: BRONCHIAL NEEDLE ASPIRATION BIOPSIES;  Surgeon: Josephine Igo, DO;  [facility]: [facility];  Service: Cardiopulmonary;;   BRONCHIAL NEEDLE ASPIRATION BIOPSY  12/04/2022   Procedure: BRONCHIAL NEEDLE ASPIRATION BIOPSIES;  Surgeon: Josephine Igo, DO;  [facility];  Service: Pulmonary;;   BRONCHIAL WASHINGS  10/13/2019   Procedure: BRONCHIAL WASHINGS;  Surgeon: Josephine Igo, DO;  [facility];  Service: Pulmonary;;   BRONCHIAL WASHINGS  12/04/2022   Procedure: BRONCHIAL WASHINGS;  Surgeon: Josephine Igo, DO;  [facility];  Service: Pulmonary;;   COLONOSCOPY     multiple   DIVERTING ILEOSTOMY N/A 12/14/2021   Procedure: DIVERTING ILEOSTOMY;  Surgeon: Karie Soda, MD;  Location: [facility];  Service: General;  Laterality: N/A;   ENDOBRONCHIAL ULTRASOUND Bilateral 04/11/2022   Procedure: ENDOBRONCHIAL ULTRASOUND;  Surgeon: Josephine Igo, DO;  [facility]: [facility];  Service: Cardiopulmonary;  Laterality: Bilateral;   ESOPHAGEAL DILATION  06/18/2022    Procedure: ESOPHAGEAL DILATION;  Surgeon: Shellia Cleverly, DO;  [facility]: [facility];  Service: Gastroenterology;;   ESOPHAGOGASTRODUODENOSCOPY (EGD) WITH PROPOFOL N/A 06/18/2022   Procedure: ESOPHAGOGASTRODUODENOSCOPY (EGD) WITH PROPOFOL;  Surgeon: Shellia Cleverly, DO;  [facility]: [facility];  Service: Gastroenterology;  Laterality: N/A;  Severe dysphagia/odynophagia ?radiation esophagitis versus oral candidiasis   FIDUCIAL MARKER PLACEMENT  12/04/2022   Procedure: FIDUCIAL MARKER PLACEMENT;  Surgeon: Josephine Igo, DO;  [facility];  Service: Pulmonary;;   ILEOSTOMY CLOSURE N/A 03/22/2022   Procedure: OPEN TAKEDOWN OF LOOP ILEOSTOMY;  Surgeon: Karie Soda, MD;  Location: [facility];  Service: General;  Laterality: N/A;  GEN w/ERAS PATHWAY LOCAL   INTERCOSTAL NERVE BLOCK Left 12/16/2019   Procedure: INTERCOSTAL NERVE BLOCK;  Surgeon: Corliss Skains, MD;  [facility]: [facility];  Service: Thoracic;  Laterality: Left;   IR FLUORO RM 30-60 MIN  07/02/2022   IR IMAGING GUIDED PORT INSERTION  05/21/2023   KNEE SURGERY  07/20/2008   bilat.   NASAL SEPTUM SURGERY  1995   NODE DISSECTION Left 12/16/2019   Procedure: NODE DISSECTION;  Surgeon: Corliss Skains, MD;  Location: [facility] OR;  Service: Thoracic;  Laterality: Left;   PEG PLACEMENT N/A 07/04/2022   Procedure: PERCUTANEOUS ENDOSCOPIC GASTROSTOMY (PEG) PLACEMENT;  Surgeon: Iva Boop, MD;  Location: [facility];  Service: Gastroenterology;  Laterality: N/A;   RADIOACTIVE SEED GUIDED AXILLARY SENTINEL LYMPH NODE Left 03/20/2022   Procedure: RADIOACTIVE SEED GUIDED LEFT AXILLARY SENTINEL LYMPH NODE BIOPSY;  Surgeon: Harriette Bouillon, MD;  Location[facility];  Service: General;  Laterality: Left;   RECTAL EXAM UNDER ANESTHESIA N/A 03/22/2022   Procedure: ANORECTAL EXAMINATION UNDER ANESTHESIA;  Surgeon: Karie Soda, MD;  Location: [facility];  Service: General;  Laterality: N/A;   TONSILLECTOMY     TRANSURETHRAL RESECTION OF BLADDER TUMOR   2002   UPPER GASTROINTESTINAL ENDOSCOPY     VIDEO BRONCHOSCOPY  04/11/2022   Procedure: VIDEO BRONCHOSCOPY;  Surgeon: Josephine Igo, DO;  [facility]: [facility];  Service: Cardiopulmonary;;   VIDEO BRONCHOSCOPY WITH ENDOBRONCHIAL NAVIGATION N/A 10/13/2019   Procedure: VIDEO BRONCHOSCOPY WITH ENDOBRONCHIAL NAVIGATION;  Surgeon: Josephine Igo, DO;  [facility];  Service: Pulmonary;  Laterality: N/A;   WISDOM TOOTH EXTRACTION     XI ROBOTIC ASSISTED LOWER ANTERIOR RESECTION N/A 12/14/2021   Procedure: XI ROBOTIC ASSISTED LOWER ANTERIOR ULTRA LOW RECTOSIGMOID RESECTION, COLOANAL HAND SEWN ANASTOMOSIS, AND BILATERAL TAP BLOCK;  Surgeon: Karie Soda, MD;  Location: [facility];  Service: General;  Laterality: N/A;   Social History   Tobacco Use   Smoking status: Former    Current packs/day: 0.00    Average packs/day: 0.5 packs/day for 40.0 years (20.0 ttl pk-yrs)    Types: Cigarettes    Start date: 09/21/1979    Quit date: 09/21/2019    Years since quitting: 3.7   Smokeless tobacco: Never  Vaping Use   Vaping status: Never Used  Substance Use Topics   Alcohol use: Not Currently    Alcohol/week: 15.0 standard drinks of alcohol    Types: 15 Standard drinks or equivalent per week    Comment: wine/beer/martini   Drug use: Not Currently    Types: Marijuana    Comment: Smokes marijuana occasional, last use in 08/2019   Family History  Problem Relation Age of Onset   Dementia Mother    Diabetes Father    Mental illness Father    Peripheral vascular disease Brother    Colon cancer Neg Hx    Esophageal cancer Neg Hx    Stomach cancer Neg Hx    Rectal cancer Neg Hx    No Known Allergies    ROS Negative unless stated above    Objective:     BP 124/72 (BP Location: Left Arm, Patient Position: Sitting, Cuff Size: Normal)   Pulse 92   Temp 98.1 F (36.7 C) (Oral)   Ht 5\' 11"  (1.803 m)   Wt 135 lb 12.8 oz (61.6 kg)   SpO2 97%   BMI 18.94 kg/m  BP Readings from Last 3  Encounters:  06/12/23 124/72  05/28/23 138/84  05/21/23 133/70   Wt Readings from Last 3 Encounters:  06/12/23 135 lb 12.8 oz (61.6 kg)  05/28/23 135 lb (61.2 kg)  05/21/23 134 lb (60.8 kg)    Physical Exam Constitutional:      General: He is not in acute distress.    Appearance: Normal appearance.  HENT:     Head: Normocephalic and atraumatic.     Nose: Nose normal.     Mouth/Throat:     Mouth: Mucous membranes  are moist.  Cardiovascular:     Rate and Rhythm: Normal rate and regular rhythm.     Heart sounds: Normal heart sounds. No murmur heard.    No gallop.  Pulmonary:     Effort: Pulmonary effort is normal. No respiratory distress.     Breath sounds: Normal breath sounds. No wheezing, rhonchi or rales.  Skin:    General: Skin is warm and dry.  Neurological:     Mental Status: He is alert and oriented to person, place, and time.      06/12/2023   10:04 AM 09/12/2022    3:46 PM 05/07/2022    7:53 AM  Depression screen PHQ 2/9  Decreased Interest 0 0 0  Down, Depressed, Hopeless 0 0 0  PHQ - 2 Score 0 0 0  Altered sleeping 0    Tired, decreased energy 0    Change in appetite 0    Feeling bad or failure about yourself  0    Trouble concentrating 0    Moving slowly or fidgety/restless 0    Suicidal thoughts 0    PHQ-9 Score 0        06/12/2023   10:04 AM  GAD 7 : Generalized Anxiety Score  Nervous, Anxious, on Edge 0  Control/stop worrying 0  Worry too much - different things 0  Trouble relaxing 0  Restless 0  Easily annoyed or irritable 0  Afraid - awful might happen 0  Total GAD 7 Score 0    No results found for any visits on 06/12/23.    Assessment & Plan:  Neuropathy -     Gabapentin; Take 1 capsule (100 mg total) by mouth at bedtime.  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  Given 4 mg Zofran IM.  Thought 2/2 taking meds on empty stomach.  Continue Alimta.  Continue f/u with Onc, Pulm, palliative Care, GI. Immunizations discussed.  Discussd r/b/a of an additional pna vaccine as PCV 13 and PPSV 23 done in 2017 and 2018.  Given pt's current health, PCV 20 given.  Pt to have RSV vaccine done at local pharmacy as not available in clinic.   On day of service, 51 minutes spent caring for this patient face-to-face, reviewing the chart, counseling and/or coordinating care for plan and treatment of diagnosis below.    Return in about 5 weeks (around 07/17/2023), or if symptoms worsen or fail to improve.   [name], MD

## 2023-06-14 ENCOUNTER — Other Ambulatory Visit: Payer: Self-pay | Admitting: Physician Assistant

## 2023-06-14 DIAGNOSIS — C3490 Malignant neoplasm of unspecified part of unspecified bronchus or lung: Secondary | ICD-10-CM

## 2023-06-17 DIAGNOSIS — M9903 Segmental and somatic dysfunction of lumbar region: Secondary | ICD-10-CM | POA: Diagnosis not present

## 2023-06-17 DIAGNOSIS — M5431 Sciatica, right side: Secondary | ICD-10-CM | POA: Diagnosis not present

## 2023-06-18 ENCOUNTER — Inpatient Hospital Stay: Payer: Medicare Other

## 2023-06-18 ENCOUNTER — Inpatient Hospital Stay: Payer: Medicare Other | Admitting: Internal Medicine

## 2023-06-18 ENCOUNTER — Inpatient Hospital Stay: Payer: Medicare Other | Attending: Internal Medicine

## 2023-06-18 VITALS — BP 128/80 | HR 80 | Resp 15

## 2023-06-18 VITALS — BP 134/79 | HR 84 | Temp 97.9°F | Resp 15 | Ht 71.0 in | Wt 134.0 lb

## 2023-06-18 DIAGNOSIS — Z7982 Long term (current) use of aspirin: Secondary | ICD-10-CM | POA: Insufficient documentation

## 2023-06-18 DIAGNOSIS — D638 Anemia in other chronic diseases classified elsewhere: Secondary | ICD-10-CM | POA: Insufficient documentation

## 2023-06-18 DIAGNOSIS — C3431 Malignant neoplasm of lower lobe, right bronchus or lung: Secondary | ICD-10-CM

## 2023-06-18 DIAGNOSIS — C3412 Malignant neoplasm of upper lobe, left bronchus or lung: Secondary | ICD-10-CM | POA: Insufficient documentation

## 2023-06-18 DIAGNOSIS — C3492 Malignant neoplasm of unspecified part of left bronchus or lung: Secondary | ICD-10-CM

## 2023-06-18 DIAGNOSIS — Z79899 Other long term (current) drug therapy: Secondary | ICD-10-CM | POA: Insufficient documentation

## 2023-06-18 DIAGNOSIS — Z923 Personal history of irradiation: Secondary | ICD-10-CM | POA: Insufficient documentation

## 2023-06-18 DIAGNOSIS — Z95828 Presence of other vascular implants and grafts: Secondary | ICD-10-CM

## 2023-06-18 DIAGNOSIS — C771 Secondary and unspecified malignant neoplasm of intrathoracic lymph nodes: Secondary | ICD-10-CM | POA: Insufficient documentation

## 2023-06-18 DIAGNOSIS — C3490 Malignant neoplasm of unspecified part of unspecified bronchus or lung: Secondary | ICD-10-CM

## 2023-06-18 DIAGNOSIS — Z5111 Encounter for antineoplastic chemotherapy: Secondary | ICD-10-CM | POA: Insufficient documentation

## 2023-06-18 DIAGNOSIS — Z9221 Personal history of antineoplastic chemotherapy: Secondary | ICD-10-CM | POA: Insufficient documentation

## 2023-06-18 LAB — CBC WITH DIFFERENTIAL (CANCER CENTER ONLY)
Abs Immature Granulocytes: 0.02 10*3/uL (ref 0.00–0.07)
Basophils Absolute: 0 10*3/uL (ref 0.0–0.1)
Basophils Relative: 0 %
Eosinophils Absolute: 0.1 10*3/uL (ref 0.0–0.5)
Eosinophils Relative: 1 %
HCT: 27.3 % — ABNORMAL LOW (ref 39.0–52.0)
Hemoglobin: 8.6 g/dL — ABNORMAL LOW (ref 13.0–17.0)
Immature Granulocytes: 0 %
Lymphocytes Relative: 15 %
Lymphs Abs: 0.8 10*3/uL (ref 0.7–4.0)
MCH: 31 pg (ref 26.0–34.0)
MCHC: 31.5 g/dL (ref 30.0–36.0)
MCV: 98.6 fL (ref 80.0–100.0)
Monocytes Absolute: 0.4 10*3/uL (ref 0.1–1.0)
Monocytes Relative: 7 %
Neutro Abs: 4.2 10*3/uL (ref 1.7–7.7)
Neutrophils Relative %: 77 %
Platelet Count: 249 10*3/uL (ref 150–400)
RBC: 2.77 MIL/uL — ABNORMAL LOW (ref 4.22–5.81)
RDW: 15.8 % — ABNORMAL HIGH (ref 11.5–15.5)
WBC Count: 5.5 10*3/uL (ref 4.0–10.5)
nRBC: 0 % (ref 0.0–0.2)

## 2023-06-18 LAB — CMP (CANCER CENTER ONLY)
ALT: 31 U/L (ref 0–44)
AST: 28 U/L (ref 15–41)
Albumin: 3.5 g/dL (ref 3.5–5.0)
Alkaline Phosphatase: 125 U/L (ref 38–126)
Anion gap: 6 (ref 5–15)
BUN: 29 mg/dL — ABNORMAL HIGH (ref 8–23)
CO2: 28 mmol/L (ref 22–32)
Calcium: 9.5 mg/dL (ref 8.9–10.3)
Chloride: 106 mmol/L (ref 98–111)
Creatinine: 0.62 mg/dL (ref 0.61–1.24)
GFR, Estimated: >60 mL/min (ref >60)
Glucose, Bld: 121 mg/dL — ABNORMAL HIGH (ref 70–99)
Potassium: 3.7 mmol/L (ref 3.5–5.1)
Sodium: 140 mmol/L (ref 135–145)
Total Bilirubin: 0.3 mg/dL (ref 0.0–1.2)
Total Protein: 7.1 g/dL (ref 6.5–8.1)

## 2023-06-18 MED ORDER — SODIUM CHLORIDE 0.9% FLUSH
10.0000 mL | Freq: Once | INTRAVENOUS | Status: AC
  Administered 2023-06-18: 10 mL

## 2023-06-18 MED ORDER — CYANOCOBALAMIN 1000 MCG/ML IJ SOLN
1000.0000 ug | Freq: Once | INTRAMUSCULAR | Status: AC
  Administered 2023-06-18: 1000 ug via INTRAMUSCULAR
  Filled 2023-06-18: qty 1

## 2023-06-18 MED ORDER — PROCHLORPERAZINE MALEATE 10 MG PO TABS
10.0000 mg | ORAL_TABLET | Freq: Once | ORAL | Status: AC
  Administered 2023-06-18: 10 mg via ORAL
  Filled 2023-06-18: qty 1

## 2023-06-18 MED ORDER — SODIUM CHLORIDE 0.9 % IV SOLN: INTRAVENOUS | Status: DC

## 2023-06-18 MED ORDER — SODIUM CHLORIDE 0.9 % IV SOLN
500.0000 mg/m2 | Freq: Once | INTRAVENOUS | Status: AC
Start: 2023-06-18 — End: 2023-06-18
  Administered 2023-06-18: 900 mg via INTRAVENOUS
  Filled 2023-06-18: qty 20

## 2023-06-18 MED ORDER — HEPARIN SOD (PORK) LOCK FLUSH 100 UNIT/ML IV SOLN
500.0000 [IU] | Freq: Once | INTRAVENOUS | Status: AC | PRN
  Administered 2023-06-18: 500 [IU]

## 2023-06-18 MED ORDER — SODIUM CHLORIDE 0.9% FLUSH
10.0000 mL | INTRAVENOUS | Status: DC | PRN
Start: 2023-06-18 — End: 2023-06-18
  Administered 2023-06-18: 10 mL

## 2023-06-18 NOTE — Progress Notes (Signed)
 [facility] Telephone:([phone]) [phone]   [phone]  OFFICE PROGRESS NOTE  Jesse Saint, MD [address] [place] [address]  DIAGNOSIS:  1) recurrent non-small cell lung cancer, adenocarcinoma with positive mediastinal lymph node in December 2023 that was initially diagnosed as stage IIB (T1 a, N1, M0) non-small cell lung cancer, adenocarcinoma diagnosed in June 2021.  The patient also has evidence for recurrent disease in the mediastinal lymph node in January 2024 and in the right lower lobe in August 2024. 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  Biomarker Findings Tumor Mutational Burden - 20 Muts/Mb Microsatellite status - MS-Stable Genomic Findings For a complete list of the genes assayed, please refer to the Appendix. KEAP1 I433fs*17 DNMT3A E817* RB1 splice site 1960+5G>C TP53 R209* 8 Disease relevant genes with no reportable alterations: ALK, BRAF, EGFR, ERBB2, KRAS, MET, RET, ROS1  PDL1 Expression: 1 %  PRIOR THERAPY:  1) status post left upper lobectomy with lymph node dissection on December 16, 2019 under the care of Dr. Cliffton Asters.  The tumor size measured 0.9 cm but there was involvement of the level 10 L and 11 L.  2) Adjuvant systemic chemotherapy with cisplatin 75 mg/M2 and Alimta 500 mg/M2 every 3 weeks.  First dose February 04, 2020.  Status post 4 cycles. 3) Neoadjuvant treatment with imatinib 400 mg p.o. daily for the GIST tumor of the pelvic area.  First dose started July 18, 2020.  Status post 12 months of treatment. 4) status post robotic assisted very low anterior rectosigmoid resection with coloanal anastomosis, diverting loop ileostomy and wedge liver biopsy under the care of Dr. Michaell Cowing on December 14, 2021 and it showed minimal residual gastrointestinal stromal tumor. 5) concurrent chemoradiation with weekly carboplatin for AUC of 2 and paclitaxel 45 Mg/M2.  First dose May 14, 2022. 6) Imatinib 400 mg p.o.  daily 7) Systemic chemotherapy with carboplatin for AUC of 5 and Alimta 500 Mg/M2 every 3 weeks.  First dose 12/25/2022.  Status post 6 cycles.  CURRENT THERAPY: Maintenance treatment with single agent Alimta 500 Mg/M2, status post 2 cycles.  INTERVAL HISTORY: Jesse [name] 73 y.o. male returns to the clinic today for follow-up visit. Discussed the use of AI scribe software for clinical note transcription with the patient, who gave verbal consent to proceed.  History of Present Illness   Jesse [name] "Jesse [name]" is a 73 year old male with lung cancer who presents for chemotherapy follow-up.  He has a history of lung cancer with multiple recurrences and has been undergoing treatment since August 2024. Initially, he received four cycles of chemotherapy with carboplatin and Alimta, followed by maintenance therapy with Alimta alone every three weeks. He has completed two cycles of the maintenance therapy and is here for the third cycle.  He feels generally well but notes a lack of energy. No new complaints, bleeding, or blood in his stool. He is currently taking iron supplements to address his anemia, which has been persistent. His hemoglobin remains low, and he is unsure of the cause of the anemia, though it may be related to his ongoing cancer treatment.  He remains active, engaging in activities such as gardening and working with wood, although he acknowledges needing to be careful. He has planned a trip to [name], a destination he and his wife have visited frequently since the mid-1980s.         MEDICAL HISTORY: Past Medical History:  Diagnosis Date  Anemia    Cancer (HCC)    bladder cancer   COPD (chronic obstructive pulmonary disease) (HCC)    mild    DIVERTICULOSIS, COLON 01/14/2007   Qualifier: Diagnosis of  By: Marcelyn Ditty RN, Katy Fitch    GASTRITIS, CHRONIC 06/01/2004   Qualifier: Diagnosis of  By: Creta Levin CMA (AAMA), Robin     GERD 01/09/2007   Qualifier: Diagnosis of  By: Tawanna Cooler,  RN, Ellen     Heart murmur    as a child; no murmur heard 12/14/19   History of radiation therapy    Mediastinum- 05/15/22-06/28/22-Dr. Antony Blackbird   HYPERLIPIDEMIA 01/09/2007   Qualifier: Diagnosis of  By: Everett Graff     Hypertension    Lung mass    left upper nodule   Macular degeneration    right eye   NEOPLASM, MALIGNANT, BLADDER, HX OF 2002   Qualifier: Diagnosis of  By: Creta Levin CMA (AAMA), Robin  / no chemo or radiation   OSTEOARTHRITIS 01/14/2007   Qualifier: Diagnosis of  By: Marcelyn Ditty, RN, Katy Fitch - knees   Rectal mass    Reflux esophagitis 06/01/2004   Qualifier: Diagnosis of  By: Creta Levin CMA Duncan Dull), Robin     Stroke [facility])    2024   TOBACCO USER 02/16/2009   Qualifier: Diagnosis of  By: Amador Cunas  MD, Janett Labella    VITAMIN D DEFICIENCY 06/03/2007   Qualifier: Diagnosis of  By: Amador Cunas  MD, Janett Labella     ALLERGIES:  has no known allergies.  MEDICATIONS:  Current Outpatient Medications  Medication Sig Dispense Refill   acetaminophen (TYLENOL) 500 MG tablet Take 1,000 mg by mouth every 8 (eight) hours as needed for moderate pain.     aspirin EC 81 MG tablet Take 81 mg by mouth daily. Swallow whole.     atorvastatin (LIPITOR) 20 MG tablet Take 1 tablet (20 mg total) by mouth daily. 90 tablet 3   Cholecalciferol (VITAMIN D3 PO) Take 2,000 Units by mouth daily.     dexamethasone (DECADRON) 4 MG tablet PLEASE TAKE 1 TABLET TWICE A DAY THE DAY BEFORE, THE DAY OF, AND THE DAY AFTER CHEMOTHERAPY 60 tablet 0   diphenoxylate-atropine (LOMOTIL) 2.5-0.025 MG tablet Take 2 tablets by mouth 3 (three) times daily as needed for diarrhea or loose stools. 90 tablet 0   ferrous sulfate 325 (65 FE) MG EC tablet Take 1 tablet (325 mg total) by mouth 2 (two) times daily. 60 tablet 1   folic acid (FOLVITE) 1 MG tablet TAKE 1 TABLET BY MOUTH EVERY DAY 90 tablet 1   gabapentin (NEURONTIN) 100 MG capsule Take 1 capsule (100 mg total) by mouth at bedtime. 30 capsule 3   lidocaine  (XYLOCAINE) 5 % ointment Apply 1 Application topically 3 (three) times daily as needed for mild pain or moderate pain. 35.44 g 3   lidocaine-prilocaine (EMLA) cream Apply 1 Application topically as needed. 30 g 2   loperamide (IMODIUM A-D) 2 MG tablet Take 4 mg by mouth 4 (four) times daily as needed for diarrhea or loose stools.     mirtazapine (REMERON SOL-TAB) 30 MG disintegrating tablet TAKE 1 TABLET BY MOUTH AT BEDTIME. 90 tablet 1   Multiple Vitamins-Minerals (PRESERVISION AREDS 2) CAPS Take 1 capsule by mouth 2 (two) times daily.     OVER THE COUNTER MEDICATION Take 1 tablet by mouth daily. PROBIOTICS     OVER THE COUNTER MEDICATION Fiber and spice pt take every morning     OVER  THE COUNTER MEDICATION Juce pt takes every morning     pantoprazole (PROTONIX) 40 MG tablet Take 1 tablet (40 mg total) by mouth 2 (two) times daily. 60 tablet 6   prochlorperazine (COMPAZINE) 10 MG tablet Take 1 tablet (10 mg total) by mouth every 6 (six) hours as needed for nausea or vomiting. 30 tablet 0   No current facility-administered medications for this visit.    SURGICAL HISTORY:  Past Surgical History:  Procedure Laterality Date   BIOPSY  06/18/2022   Procedure: BIOPSY;  Surgeon: Shellia Cleverly, DO;  Location: [facility];  Service: Gastroenterology;;   BREAST BIOPSY Left 03/16/2022   [place] LT RADIOACTIVE SEED LOC 03/16/2022 [facility]   BRONCHIAL BIOPSY  10/13/2019   Procedure: BRONCHIAL BIOPSIES;  Surgeon: Josephine Igo, DO;  [facility];  Service: Pulmonary;;   BRONCHIAL BIOPSY  12/04/2022   Procedure: BRONCHIAL BIOPSIES;  Surgeon: Josephine Igo, DO;  [facility];  Service: Pulmonary;;   BRONCHIAL BRUSHINGS  10/13/2019   Procedure: BRONCHIAL BRUSHINGS;  Surgeon: Josephine Igo, DO;  [facility];  Service: Pulmonary;;   BRONCHIAL NEEDLE ASPIRATION BIOPSY  10/13/2019   Procedure: BRONCHIAL NEEDLE ASPIRATION BIOPSIES;  Surgeon: Josephine Igo, DO;   [facility];  Service: Pulmonary;;   BRONCHIAL NEEDLE ASPIRATION BIOPSY  04/11/2022   Procedure: BRONCHIAL NEEDLE ASPIRATION BIOPSIES;  Surgeon: Josephine Igo, DO;  [facility];  Service: Cardiopulmonary;;   BRONCHIAL NEEDLE ASPIRATION BIOPSY  12/04/2022   Procedure: BRONCHIAL NEEDLE ASPIRATION BIOPSIES;  Surgeon: Josephine Igo, DO;  [facility];  Service: Pulmonary;;   BRONCHIAL WASHINGS  10/13/2019   Procedure: BRONCHIAL WASHINGS;  Surgeon: Josephine Igo, DO;  [facility];  Service: Pulmonary;;   BRONCHIAL WASHINGS  12/04/2022   Procedure: BRONCHIAL WASHINGS;  Surgeon: Josephine Igo, DO;  [facility];  Service: Pulmonary;;   COLONOSCOPY     multiple   DIVERTING ILEOSTOMY N/A 12/14/2021   Procedure: DIVERTING ILEOSTOMY;  Surgeon: Karie Soda, MD;  [facility]: [facility];  Service: General;  Laterality: N/A;   ENDOBRONCHIAL ULTRASOUND Bilateral 04/11/2022   Procedure: ENDOBRONCHIAL ULTRASOUND;  Surgeon: Josephine Igo, DO;  [facility];  Service: Cardiopulmonary;  Laterality: Bilateral;   ESOPHAGEAL DILATION  06/18/2022   Procedure: ESOPHAGEAL DILATION;  Surgeon: Shellia Cleverly, DO;  [facility];  Service: Gastroenterology;;   ESOPHAGOGASTRODUODENOSCOPY (EGD) WITH PROPOFOL N/A 06/18/2022   Procedure: ESOPHAGOGASTRODUODENOSCOPY (EGD) WITH PROPOFOL;  Surgeon: Shellia Cleverly, DO;  [facility];  Service: Gastroenterology;  Laterality: N/A;  Severe dysphagia/odynophagia ?radiation esophagitis versus oral candidiasis   FIDUCIAL MARKER PLACEMENT  12/04/2022   Procedure: FIDUCIAL MARKER PLACEMENT;  Surgeon: Josephine Igo, DO;  [facility];  Service: Pulmonary;;   ILEOSTOMY CLOSURE N/A 03/22/2022   Procedure: OPEN TAKEDOWN OF LOOP ILEOSTOMY;  Surgeon: Karie Soda, MD;  [facility];  Service: General;  Laterality: N/A;  GEN w/ERAS PATHWAY LOCAL   INTERCOSTAL NERVE BLOCK Left 12/16/2019    Procedure: INTERCOSTAL NERVE BLOCK;  Surgeon: Corliss Skains, MD;  [facility];  Service: Thoracic;  Laterality: Left;   IR FLUORO RM 30-60 MIN  07/02/2022   IR IMAGING GUIDED PORT INSERTION  05/21/2023   KNEE SURGERY  07/20/2008   bilat.   NASAL SEPTUM SURGERY  1995   NODE DISSECTION Left 12/16/2019   Procedure: NODE DISSECTION;  Surgeon: Corliss Skains, MD;  [facility];  Service: Thoracic;  Laterality: Left;  PEG PLACEMENT N/A 07/04/2022   Procedure: PERCUTANEOUS ENDOSCOPIC GASTROSTOMY (PEG) PLACEMENT;  Surgeon: Iva Boop, MD;  [facility];  Service: Gastroenterology;  Laterality: N/A;   RADIOACTIVE SEED GUIDED AXILLARY SENTINEL LYMPH NODE Left 03/20/2022   Procedure: RADIOACTIVE SEED GUIDED LEFT AXILLARY SENTINEL LYMPH NODE BIOPSY;  Surgeon: Harriette Bouillon, MD;  [facility];  Service: General;  Laterality: Left;   RECTAL EXAM UNDER ANESTHESIA N/A 03/22/2022   Procedure: ANORECTAL EXAMINATION UNDER ANESTHESIA;  Surgeon: Karie Soda, MD;  [facility];  Service: General;  Laterality: N/A;   TONSILLECTOMY     TRANSURETHRAL RESECTION OF BLADDER TUMOR  2002   UPPER GASTROINTESTINAL ENDOSCOPY     VIDEO BRONCHOSCOPY  04/11/2022   Procedure: VIDEO BRONCHOSCOPY;  Surgeon: Josephine Igo, DO;  [facility];  Service: Cardiopulmonary;;   VIDEO BRONCHOSCOPY WITH ENDOBRONCHIAL NAVIGATION N/A 10/13/2019   Procedure: VIDEO BRONCHOSCOPY WITH ENDOBRONCHIAL NAVIGATION;  Surgeon: Josephine Igo, DO;  [facility];  Service: Pulmonary;  Laterality: N/A;   WISDOM TOOTH EXTRACTION     XI ROBOTIC ASSISTED LOWER ANTERIOR RESECTION N/A 12/14/2021   Procedure: XI ROBOTIC ASSISTED LOWER ANTERIOR ULTRA LOW RECTOSIGMOID RESECTION, COLOANAL HAND SEWN ANASTOMOSIS, AND BILATERAL TAP BLOCK;  Surgeon: Karie Soda, MD;  Location: [facility];  Service: General;  Laterality: N/A;    REVIEW OF SYSTEMS:  A comprehensive review of systems was negative except for:  Constitutional: positive for fatigue   PHYSICAL EXAMINATION: General appearance: alert, cooperative, fatigued, and no distress Head: Normocephalic, without obvious abnormality, atraumatic Neck: no adenopathy, no JVD, supple, symmetrical, trachea midline, and thyroid not enlarged, symmetric, no tenderness/mass/nodules Lymph nodes: Cervical, supraclavicular, and axillary nodes normal. Resp: clear to auscultation bilaterally Back: symmetric, no curvature. ROM normal. No CVA tenderness. Cardio: regular rate and rhythm, S1, S2 normal, no murmur, click, rub or gallop GI: soft, non-tender; bowel sounds normal; no masses,  no organomegaly Extremities: extremities normal, atraumatic, no cyanosis or edema  ECOG PERFORMANCE STATUS: 1 - Symptomatic but completely ambulatory  Blood pressure 134/79, pulse 84, temperature 97.9 F (36.6 C), temperature source Temporal, resp. rate 15, height 5\' 11"  (1.803 m), weight 134 lb (60.8 kg), SpO2 100%.  LABORATORY DATA: Lab Results  Component Value Date   WBC 5.5 06/18/2023   HGB 8.6 (L) 06/18/2023   HCT 27.3 (L) 06/18/2023   MCV 98.6 06/18/2023   PLT 249 06/18/2023      Chemistry      Component Value Date/Time   NA 138 05/28/2023 0929   K 4.0 05/28/2023 0929   CL 103 05/28/2023 0929   CO2 28 05/28/2023 0929   BUN 30 (H) 05/28/2023 0929   CREATININE 0.61 05/28/2023 0929      Component Value Date/Time   CALCIUM 9.9 05/28/2023 0929   ALKPHOS 129 (H) 05/28/2023 0929   AST 38 05/28/2023 0929   ALT 52 (H) 05/28/2023 0929   BILITOT 0.4 05/28/2023 0929       RADIOGRAPHIC STUDIES: IR IMAGING GUIDED PORT INSERTION Result Date: 05/21/2023 CLINICAL DATA:  History of recurrent metastatic lung adenocarcinoma and need for porta cath for continued chemotherapy. EXAM: IMPLANTED PORT A CATH PLACEMENT WITH ULTRASOUND AND FLUOROSCOPIC GUIDANCE ANESTHESIA/SEDATION: Moderate (conscious) sedation was employed during this procedure. A total of Versed 2.0 mg and  Fentanyl 100 mcg was administered intravenously. Moderate Sedation Time: 38 minutes. The patient's level of consciousness and vital signs were monitored continuously by radiology nursing throughout the procedure under my direct supervision. FLUOROSCOPY: 1 minute and 12  seconds.  6.0 mGy. PROCEDURE: The procedure, risks, benefits, and alternatives were explained to the patient. Questions regarding the procedure were encouraged and answered. The patient understands and consents to the procedure. A time-out was performed prior to initiating the procedure. Ultrasound was utilized to confirm patency of the right internal jugular vein. Under ultrasound image was saved and recorded. The right neck and chest were prepped with chlorhexidine in a sterile fashion, and a sterile drape was applied covering the operative field. Maximum barrier sterile technique with sterile gowns and gloves were used for the procedure. Local anesthesia was provided with 1% lidocaine. After creating a small venotomy incision, a 21 gauge needle was advanced into the right internal jugular vein under direct, real-time ultrasound guidance. Ultrasound image documentation was performed. After securing guidewire access, an 8 Fr dilator was placed. A J-wire was kinked to measure appropriate catheter length. A subcutaneous port pocket was then created along the upper chest wall utilizing sharp and blunt dissection. Portable cautery was utilized. The pocket was irrigated with sterile saline. A single lumen power injectable port was chosen for placement. The 8 Fr catheter was tunneled from the port pocket site to the venotomy incision. The port was placed in the pocket. External catheter was trimmed to appropriate length based on guidewire measurement. At the venotomy, an 8 Fr peel-away sheath was placed over a guidewire. The catheter was then placed through the sheath and the sheath removed. Final catheter positioning was confirmed and documented with a  fluoroscopic spot image. The port was accessed with a needle and aspirated and flushed with heparinized saline. The access needle was removed. The venotomy and port pocket incisions were closed with subcutaneous 3-0 Monocryl and subcuticular 4-0 Vicryl. Dermabond was applied to both incisions. COMPLICATIONS: COMPLICATIONS None FINDINGS: After catheter placement, the tip lies at the cavo-atrial junction. The catheter aspirates normally and is ready for immediate use. IMPRESSION: Placement of single lumen port a cath via right internal jugular vein. The catheter tip lies at the cavo-atrial junction. A power injectable port a cath was placed and is ready for immediate use. Electronically Signed   By: Irish Lack M.D.   On: 05/21/2023 12:24    ASSESSMENT AND PLAN: This is a very pleasant 73 years old white male recently diagnosed with a stage IIb (T1 a, N1, M0) non-small cell lung cancer, adenocarcinoma in June 2021 status post left upper lobectomy with lymph node dissection on December 16, 2019 under the care of Dr. Cliffton Asters.  The patient has no actionable mutations and PD-L1 expression was 1%. He was also diagnosed with pelvic GIST tumor. The patient completed a course of adjuvant systemic chemotherapy with cisplatin 75 mg/M2 and Alimta 500 mg/M2 every 3 weeks status post 4 cycles.  He tolerated his treatment well except for fatigue and occasional nausea. For the pelvic GIST tumor, the patient completed neoadjuvant treatment with imatinib 400 mg p.o. daily in April 2023.  Status post 24 months.   The patient underwent surgical resection of the remaining gastrointestinal stromal tumor under the care of Dr. Michaell Cowing on December 14, 2021 with very minimal residual disease.  He resumed his treatment with imatinib and tolerating it fairly well. Repeat CT scan of the chest for restaging of his lung cancer. There was increased density of soft tissue in the left hilar region concerning for possible developing  lymphadenopathy or disease recurrence.  A PET scan was performed recently and that showed new hypermetabolic left axillary, mediastinal and left hilar adenopathy compatible  with recurrent metastatic lung cancer no hypermetabolic metastatic disease in the neck, abdomen, pelvis or skeleton. He underwent excisional biopsy of the left axillary lymph node that was not conclusive for malignancy. The patient was seen by Dr. Tonia Brooms and repeat video bronchoscopy with EBUS on April 11, 2022 and biopsy from the station 7 lymph node showed recurrent adenocarcinoma. I had a lengthy discussion with the patient today about his current condition and treatment options. I recommended for the patient a course of concurrent chemoradiation with weekly carboplatin for AUC of 2 and paclitaxel 45 Mg/M2 for 6-7 weeks.  Status post 3 cycles of chemotherapy but he completed the course of radiation.  The patient was admitted to the hospital on June 07, 2022 with significant sepsis and Klebsiella pneumonia bacteremia.  He continues to have persistent diarrhea despite discontinuation of imatinib and treatment with cholestyramine.  This is likely functional diarrhea after his surgical resection for the pelvic GIST tumor. Repeat PET scan showed no concerning findings for disease progression except for suspicious slowly growing adenocarcinoma in the right lower lobe but inflammatory changes in the right lung as well as suspicious reactive mediastinal lymphadenopathy.  The patient had repeat bronchoscopy by Dr. Tonia Brooms and the final pathology was consistent with recurrent non-small cell lung cancer. The patient started systemic chemotherapy with carboplatin for AUC of 5 and Alimta 500 Mg/M2 on 12/25/2022.  Status post 6 cycles.  He is currently on maintenance treatment with single agent Alimta every 3 weeks status post 2 cycles. The patient has been tolerating this treatment fairly well.    Non-Small Cell Lung Cancer (NSCLC) NSCLC  with multiple recurrences. Started on carboplatin and Alimta in August 2024, received four cycles. Currently on maintenance therapy with Alimta every three weeks, completed two cycles. Reports low energy levels. Hemoglobin remains low, likely due to anemia of chronic disease secondary to cancer and prolonged treatment. Discussed continuing Alimta, with a plan to scan after the fourth cycle. Patient prefers to delay the scan until after his trip to [name]. - Proceed with cycle three of Alimta today - Schedule follow-up in three weeks - Plan for a scan after the fourth cycle of Alimta  Anemia of Chronic Disease Persistent anemia, likely secondary to chronic disease from lung cancer and ongoing treatment. Denies active bleeding, including epistaxis, gum bleeding, or melena. Currently taking iron supplements. Discussed the need to monitor hemoglobin levels. - Continue iron supplements - Monitor hemoglobin levels.    The patient voices understanding of current disease status and treatment options and is in agreement with the current care plan.  All questions were answered. The patient knows to call the clinic with any problems, questions or concerns. We can certainly see the patient much sooner if necessary. The total time spent in the appointment was 20 minutes.  Disclaimer: This note was dictated with voice recognition software. Similar sounding words can inadvertently be transcribed and may not be corrected upon review.

## 2023-06-18 NOTE — Patient Instructions (Signed)
 [facility] - A DEPT OF [facility]  Discharge Instructions: Thank you for choosing [facility] to provide your oncology and hematology care.   If you have a lab appointment with the Cancer Center, please go directly to the Cancer Center and check in at the registration area.   Wear comfortable clothing and clothing appropriate for easy access to any Portacath or PICC line.   We strive to give you quality time with your provider. You may need to reschedule your appointment if you arrive late (15 or more minutes).  Arriving late affects you and other patients whose appointments are after yours.  Also, if you miss three or more appointments without notifying the office, you may be dismissed from the clinic at the provider's discretion.      For prescription refill requests, have your pharmacy contact our office and allow 72 hours for refills to be completed.    Today you received the following chemotherapy and/or immunotherapy agents Alimta      To help prevent nausea and vomiting after your treatment, we encourage you to take your nausea medication as directed.  BELOW ARE SYMPTOMS THAT SHOULD BE REPORTED IMMEDIATELY: *FEVER GREATER THAN 100.4 F (38 C) OR HIGHER *CHILLS OR SWEATING *NAUSEA AND VOMITING THAT IS NOT CONTROLLED WITH YOUR NAUSEA MEDICATION *UNUSUAL SHORTNESS OF BREATH *UNUSUAL BRUISING OR BLEEDING *URINARY PROBLEMS (pain or burning when urinating, or frequent urination) *BOWEL PROBLEMS (unusual diarrhea, constipation, pain near the anus) TENDERNESS IN MOUTH AND THROAT WITH OR WITHOUT PRESENCE OF ULCERS (sore throat, sores in mouth, or a toothache) UNUSUAL RASH, SWELLING OR PAIN  UNUSUAL VAGINAL DISCHARGE OR ITCHING   Items with * indicate a potential emergency and should be followed up as soon as possible or go to the Emergency Department if any problems should occur.  Please show the CHEMOTHERAPY ALERT CARD or IMMUNOTHERAPY  ALERT CARD at check-in to the Emergency Department and triage nurse.  Should you have questions after your visit or need to cancel or reschedule your appointment, please contact [facility] - A DEPT OF [facility]  Dept: [phone]  and follow the prompts.  Office hours are 8:00 a.m. to 4:30 p.m. Monday - Friday. Please note that voicemails left after 4:00 p.m. may not be returned until the following business day.  We are closed weekends and major holidays. You have access to a nurse at all times for urgent questions. Please call the main number to the clinic Dept: [phone] and follow the prompts.   For any non-urgent questions, you may also contact your provider using MyChart. We now offer e-Visits for anyone 79 and older to request care online for non-urgent symptoms. For details visit [email or web address].   Also download the MyChart app! Go to the app store, search "MyChart", open the app, select [place], and log in with your MyChart username and password.

## 2023-06-24 DIAGNOSIS — M9903 Segmental and somatic dysfunction of lumbar region: Secondary | ICD-10-CM | POA: Diagnosis not present

## 2023-06-24 DIAGNOSIS — M5431 Sciatica, right side: Secondary | ICD-10-CM | POA: Diagnosis not present

## 2023-07-08 DIAGNOSIS — M5431 Sciatica, right side: Secondary | ICD-10-CM | POA: Diagnosis not present

## 2023-07-08 DIAGNOSIS — M9903 Segmental and somatic dysfunction of lumbar region: Secondary | ICD-10-CM | POA: Diagnosis not present

## 2023-07-09 ENCOUNTER — Inpatient Hospital Stay: Payer: Medicare Other

## 2023-07-09 ENCOUNTER — Encounter: Payer: Self-pay | Admitting: Internal Medicine

## 2023-07-09 ENCOUNTER — Inpatient Hospital Stay (HOSPITAL_BASED_OUTPATIENT_CLINIC_OR_DEPARTMENT_OTHER): Payer: Medicare Other | Admitting: Nurse Practitioner

## 2023-07-09 ENCOUNTER — Encounter: Payer: Self-pay | Admitting: Nurse Practitioner

## 2023-07-09 ENCOUNTER — Inpatient Hospital Stay: Payer: Medicare Other | Admitting: Internal Medicine

## 2023-07-09 VITALS — HR 97

## 2023-07-09 VITALS — BP 122/76 | HR 114 | Temp 98.2°F | Resp 18 | Wt 134.2 lb

## 2023-07-09 DIAGNOSIS — Z923 Personal history of irradiation: Secondary | ICD-10-CM | POA: Diagnosis not present

## 2023-07-09 DIAGNOSIS — Z9221 Personal history of antineoplastic chemotherapy: Secondary | ICD-10-CM | POA: Diagnosis not present

## 2023-07-09 DIAGNOSIS — Z95828 Presence of other vascular implants and grafts: Secondary | ICD-10-CM

## 2023-07-09 DIAGNOSIS — C3490 Malignant neoplasm of unspecified part of unspecified bronchus or lung: Secondary | ICD-10-CM | POA: Diagnosis not present

## 2023-07-09 DIAGNOSIS — R530 Neoplastic (malignant) related fatigue: Secondary | ICD-10-CM

## 2023-07-09 DIAGNOSIS — Z79899 Other long term (current) drug therapy: Secondary | ICD-10-CM | POA: Diagnosis not present

## 2023-07-09 DIAGNOSIS — Z515 Encounter for palliative care: Secondary | ICD-10-CM

## 2023-07-09 DIAGNOSIS — R197 Diarrhea, unspecified: Secondary | ICD-10-CM

## 2023-07-09 DIAGNOSIS — C49A5 Gastrointestinal stromal tumor of rectum: Secondary | ICD-10-CM | POA: Diagnosis not present

## 2023-07-09 DIAGNOSIS — C3412 Malignant neoplasm of upper lobe, left bronchus or lung: Secondary | ICD-10-CM | POA: Diagnosis not present

## 2023-07-09 DIAGNOSIS — D638 Anemia in other chronic diseases classified elsewhere: Secondary | ICD-10-CM | POA: Diagnosis not present

## 2023-07-09 DIAGNOSIS — C3431 Malignant neoplasm of lower lobe, right bronchus or lung: Secondary | ICD-10-CM

## 2023-07-09 DIAGNOSIS — Z5111 Encounter for antineoplastic chemotherapy: Secondary | ICD-10-CM | POA: Diagnosis not present

## 2023-07-09 DIAGNOSIS — C3492 Malignant neoplasm of unspecified part of left bronchus or lung: Secondary | ICD-10-CM

## 2023-07-09 DIAGNOSIS — C771 Secondary and unspecified malignant neoplasm of intrathoracic lymph nodes: Secondary | ICD-10-CM | POA: Diagnosis not present

## 2023-07-09 DIAGNOSIS — Z7982 Long term (current) use of aspirin: Secondary | ICD-10-CM | POA: Diagnosis not present

## 2023-07-09 LAB — CMP (CANCER CENTER ONLY)
ALT: 16 U/L (ref 0–44)
AST: 17 U/L (ref 15–41)
Albumin: 3.4 g/dL — ABNORMAL LOW (ref 3.5–5.0)
Alkaline Phosphatase: 111 U/L (ref 38–126)
Anion gap: 8 (ref 5–15)
BUN: 20 mg/dL (ref 8–23)
CO2: 30 mmol/L (ref 22–32)
Calcium: 9.8 mg/dL (ref 8.9–10.3)
Chloride: 102 mmol/L (ref 98–111)
Creatinine: 0.74 mg/dL (ref 0.61–1.24)
GFR, Estimated: >60 mL/min (ref >60)
Glucose, Bld: 123 mg/dL — ABNORMAL HIGH (ref 70–99)
Potassium: 4 mmol/L (ref 3.5–5.1)
Sodium: 140 mmol/L (ref 135–145)
Total Bilirubin: 0.3 mg/dL (ref 0.0–1.2)
Total Protein: 7.7 g/dL (ref 6.5–8.1)

## 2023-07-09 LAB — CBC WITH DIFFERENTIAL (CANCER CENTER ONLY)
Abs Immature Granulocytes: 0.03 10*3/uL (ref 0.00–0.07)
Basophils Absolute: 0 10*3/uL (ref 0.0–0.1)
Basophils Relative: 0 %
Eosinophils Absolute: 0.1 10*3/uL (ref 0.0–0.5)
Eosinophils Relative: 1 %
HCT: 26.6 % — ABNORMAL LOW (ref 39.0–52.0)
Hemoglobin: 8.3 g/dL — ABNORMAL LOW (ref 13.0–17.0)
Immature Granulocytes: 0 %
Lymphocytes Relative: 12 %
Lymphs Abs: 0.9 10*3/uL (ref 0.7–4.0)
MCH: 29.2 pg (ref 26.0–34.0)
MCHC: 31.2 g/dL (ref 30.0–36.0)
MCV: 93.7 fL (ref 80.0–100.0)
Monocytes Absolute: 0.4 10*3/uL (ref 0.1–1.0)
Monocytes Relative: 6 %
Neutro Abs: 6 10*3/uL (ref 1.7–7.7)
Neutrophils Relative %: 81 %
Platelet Count: 380 10*3/uL (ref 150–400)
RBC: 2.84 MIL/uL — ABNORMAL LOW (ref 4.22–5.81)
RDW: 15.3 % (ref 11.5–15.5)
WBC Count: 7.4 10*3/uL (ref 4.0–10.5)
nRBC: 0 % (ref 0.0–0.2)

## 2023-07-09 MED ORDER — SODIUM CHLORIDE 0.9 % IV SOLN
500.0000 mg/m2 | Freq: Once | INTRAVENOUS | Status: AC
  Administered 2023-07-09: 900 mg via INTRAVENOUS
  Filled 2023-07-09: qty 20

## 2023-07-09 MED ORDER — SODIUM CHLORIDE 0.9% FLUSH
10.0000 mL | Freq: Once | INTRAVENOUS | Status: AC
  Administered 2023-07-09: 10 mL

## 2023-07-09 MED ORDER — HEPARIN SOD (PORK) LOCK FLUSH 100 UNIT/ML IV SOLN: 500.0000 [IU] | Freq: Once | INTRAVENOUS | Status: DC | PRN

## 2023-07-09 MED ORDER — SODIUM CHLORIDE 0.9% FLUSH: 10.0000 mL | INTRAVENOUS | Status: DC | PRN

## 2023-07-09 MED ORDER — SODIUM CHLORIDE 0.9 % IV SOLN: INTRAVENOUS | Status: DC

## 2023-07-09 MED ORDER — DIPHENOXYLATE-ATROPINE 2.5-0.025 MG PO TABS
2.0000 | ORAL_TABLET | Freq: Three times a day (TID) | ORAL | 0 refills | Status: DC | PRN
Start: 2023-07-09 — End: 2023-08-21

## 2023-07-09 MED ORDER — PROCHLORPERAZINE MALEATE 10 MG PO TABS
10.0000 mg | ORAL_TABLET | Freq: Once | ORAL | Status: AC
  Administered 2023-07-09: 10 mg via ORAL
  Filled 2023-07-09: qty 1

## 2023-07-09 NOTE — Patient Instructions (Signed)
 [facility] - A DEPT OF [facility]  Discharge Instructions: Thank you for choosing [facility] to provide your oncology and hematology care.   If you have a lab appointment with the Cancer Center, please go directly to the Cancer Center and check in at the registration area.   Wear comfortable clothing and clothing appropriate for easy access to any Portacath or PICC line.   We strive to give you quality time with your provider. You may need to reschedule your appointment if you arrive late (15 or more minutes).  Arriving late affects you and other patients whose appointments are after yours.  Also, if you miss three or more appointments without notifying the office, you may be dismissed from the clinic at the provider's discretion.      For prescription refill requests, have your pharmacy contact our office and allow 72 hours for refills to be completed.    Today you received the following chemotherapy and/or immunotherapy agents Alimta      To help prevent nausea and vomiting after your treatment, we encourage you to take your nausea medication as directed.  BELOW ARE SYMPTOMS THAT SHOULD BE REPORTED IMMEDIATELY: *FEVER GREATER THAN 100.4 F (38 C) OR HIGHER *CHILLS OR SWEATING *NAUSEA AND VOMITING THAT IS NOT CONTROLLED WITH YOUR NAUSEA MEDICATION *UNUSUAL SHORTNESS OF BREATH *UNUSUAL BRUISING OR BLEEDING *URINARY PROBLEMS (pain or burning when urinating, or frequent urination) *BOWEL PROBLEMS (unusual diarrhea, constipation, pain near the anus) TENDERNESS IN MOUTH AND THROAT WITH OR WITHOUT PRESENCE OF ULCERS (sore throat, sores in mouth, or a toothache) UNUSUAL RASH, SWELLING OR PAIN  UNUSUAL VAGINAL DISCHARGE OR ITCHING   Items with * indicate a potential emergency and should be followed up as soon as possible or go to the Emergency Department if any problems should occur.  Please show the CHEMOTHERAPY ALERT CARD or IMMUNOTHERAPY  ALERT CARD at check-in to the Emergency Department and triage nurse.  Should you have questions after your visit or need to cancel or reschedule your appointment, please contact [facility] - A DEPT OF [facility]  Dept: [phone]  and follow the prompts.  Office hours are 8:00 a.m. to 4:30 p.m. Monday - Friday. Please note that voicemails left after 4:00 p.m. may not be returned until the following business day.  We are closed weekends and major holidays. You have access to a nurse at all times for urgent questions. Please call the main number to the clinic Dept: [phone] and follow the prompts.   For any non-urgent questions, you may also contact your provider using MyChart. We now offer e-Visits for anyone 79 and older to request care online for non-urgent symptoms. For details visit [email or web address].   Also download the MyChart app! Go to the app store, search "MyChart", open the app, select [place], and log in with your MyChart username and password.

## 2023-07-09 NOTE — Progress Notes (Signed)
 Palliative Medicine [facility]  [phone] [phone]   Name: Jesse [name] Date: 07/09/2023 MRN: [id number]  DOB: Aug 06, 1950  Patient Care Team: [name], MD as PCP - General (Family Medicine) Si Gaul, MD as Consulting Physician (Oncology) Verner Chol, [facility] (Inactive) as Pharmacist (Pharmacist) Iva Boop, MD as Consulting Physician (Gastroenterology) Josephine Igo, DO as Consulting Physician (Pulmonary Disease) Berneice Heinrich Delbert Phenix., MD as Consulting Physician (Urology) Karie Soda, MD as Consulting Physician (General Surgery) Pricilla Riffle, MD as Consulting Physician (Cardiology)    INTERVAL HISTORY: HARVIE MORUA is a 73 y.o. 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 Previous medical history also includes hypertension, hyperlipidemia, GERD, anemia, COPD, and macular degeneration and a stroke during most recent hospitalization. Surgical history includes ileostomy and takedown. Peg tube placed (07/04/2022) for esophageal stricture/dysphagia. Palliative asked to see for symptom management and goals of care.   SOCIAL HISTORY:     reports that he quit smoking about 3 years ago. His smoking use included cigarettes. He started smoking about 43 years ago. He has a 20 pack-year smoking history. He has never used smokeless tobacco. He reports that he does not currently use alcohol after a past usage of about 15.0 standard drinks of alcohol per week. He reports that he does not currently use drugs after having used the following drugs: Marijuana.  ADVANCE DIRECTIVES:  Advanced directives on file  CODE STATUS: Full code  PAST MEDICAL HISTORY: Past Medical History:  Diagnosis Date   Anemia    Cancer (HCC)    bladder cancer   COPD (chronic obstructive pulmonary disease)  (HCC)    mild    DIVERTICULOSIS, COLON 01/14/2007   Qualifier: Diagnosis of  By: Marcelyn Ditty RN, Katy Fitch    GASTRITIS, CHRONIC 06/01/2004   Qualifier: Diagnosis of  By: Creta Levin CMA (AAMA), Robin     GERD 01/09/2007   Qualifier: Diagnosis of  By: Tawanna Cooler, RN, Ellen     Heart murmur    as a child; no murmur heard 12/14/19   History of radiation therapy    Mediastinum- 05/15/22-06/28/22-Dr. Antony Blackbird   HYPERLIPIDEMIA 01/09/2007   Qualifier: Diagnosis of  By: Everett Graff     Hypertension    Lung mass    left upper nodule   Macular degeneration    right eye   NEOPLASM, MALIGNANT, BLADDER, HX OF 2002   Qualifier: Diagnosis of  By: Creta Levin CMA (AAMA), Robin  / no chemo or radiation   OSTEOARTHRITIS 01/14/2007   Qualifier: Diagnosis of  By: Marcelyn Ditty, RN, Katy Fitch - knees   Rectal mass    Reflux esophagitis 06/01/2004   Qualifier: Diagnosis of  By: Creta Levin CMA Duncan Dull), Robin     Stroke [facility])    2024   TOBACCO USER 02/16/2009   Qualifier: Diagnosis of  By: Amador Cunas  MD, Janett Labella    VITAMIN D DEFICIENCY 06/03/2007   Qualifier: Diagnosis of  By: Amador Cunas  MD, Janett Labella     ALLERGIES:  has no known allergies.  MEDICATIONS:  Current Outpatient Medications  Medication Sig Dispense Refill   acetaminophen (TYLENOL) 500 MG tablet Take 1,000 mg by mouth every 8 (eight) hours as needed for moderate pain.     aspirin EC 81 MG tablet Take 81 mg by mouth daily. Swallow whole.     atorvastatin (LIPITOR)  20 MG tablet Take 1 tablet (20 mg total) by mouth daily. 90 tablet 3   Cholecalciferol (VITAMIN D3 PO) Take 2,000 Units by mouth daily.     dexamethasone (DECADRON) 4 MG tablet PLEASE TAKE 1 TABLET TWICE A DAY THE DAY BEFORE, THE DAY OF, AND THE DAY AFTER CHEMOTHERAPY 60 tablet 0   diphenoxylate-atropine (LOMOTIL) 2.5-0.025 MG tablet Take 2 tablets by mouth 3 (three) times daily as needed for diarrhea or loose stools. 90 tablet 0   ferrous sulfate 325 (65 FE) MG EC tablet Take 1 tablet (325  mg total) by mouth 2 (two) times daily. 60 tablet 1   folic acid (FOLVITE) 1 MG tablet TAKE 1 TABLET BY MOUTH EVERY DAY 90 tablet 1   gabapentin (NEURONTIN) 100 MG capsule Take 1 capsule (100 mg total) by mouth at bedtime. 30 capsule 3   lidocaine (XYLOCAINE) 5 % ointment Apply 1 Application topically 3 (three) times daily as needed for mild pain or moderate pain. 35.44 g 3   lidocaine-prilocaine (EMLA) cream Apply 1 Application topically as needed. 30 g 2   loperamide (IMODIUM A-D) 2 MG tablet Take 4 mg by mouth 4 (four) times daily as needed for diarrhea or loose stools.     mirtazapine (REMERON SOL-TAB) 30 MG disintegrating tablet TAKE 1 TABLET BY MOUTH AT BEDTIME. 90 tablet 1   Multiple Vitamins-Minerals (PRESERVISION AREDS 2) CAPS Take 1 capsule by mouth 2 (two) times daily.     OVER THE COUNTER MEDICATION Take 1 tablet by mouth daily. PROBIOTICS     OVER THE COUNTER MEDICATION Fiber and spice pt take every morning     OVER THE COUNTER MEDICATION Juce pt takes every morning     pantoprazole (PROTONIX) 40 MG tablet Take 1 tablet (40 mg total) by mouth 2 (two) times daily. 60 tablet 6   prochlorperazine (COMPAZINE) 10 MG tablet Take 1 tablet (10 mg total) by mouth every 6 (six) hours as needed for nausea or vomiting. 30 tablet 0   No current facility-administered medications for this visit.   Facility-Administered Medications Ordered in Other Visits  Medication Dose Route Frequency Provider Last Rate Last Admin   0.9 %  sodium chloride infusion   Intravenous Continuous Si Gaul, MD   Stopped at 07/09/23 1301   heparin lock flush 100 unit/mL  500 Units Intracatheter Once PRN Si Gaul, MD       sodium chloride flush (NS) 0.9 % injection 10 mL  10 mL Intracatheter PRN Si Gaul, MD        VITAL SIGNS: There were no vitals taken for this visit. There were no vitals filed for this visit.  Estimated body mass index is 18.72 kg/m as calculated from the following:    Height as of 06/18/23: 5\' 11"  (1.803 m).   Weight as of an earlier encounter on 07/09/23: 134 lb 3.2 oz (60.9 kg).   PERFORMANCE STATUS (ECOG) : 1 - Symptomatic but completely ambulatory   Physical Exam General: NAD Cardiovascular: regular rate and rhythm Pulmonary: normal breathing pattern Extremities: no edema, no joint deformities Skin: no rashes Neurological: AAO x3  IMPRESSION: Discussed the use of AI scribe software for clinical note transcription with the patient, who gave verbal consent to proceed. History of Present Illness I saw Mr. EMMANUAL GAUTHREAUX "[name]" during his infusion for symptom management follow-up. He recently traveled to [place], a trip he has been making for many years but had not done in the last three years. He found  the trip to be a good test for his condition and reports he tolerated the trip without difficulty. Much appreciative of this. Denies concerns of nausea, vomiting, or constipation. Denies pain or discomfort.   He has been experiencing low energy levels, which he describes as 'terrible.' He inquires about the occasional use of a five-hour energy drink to help boost his energy, especially during garden season when he needs to be outside more. He mentions that he used to take one energy drink a day in the past when he needed an energy boost for work. He has a good appetite despite ongoing fatigue. Education provided on use of energy drinks. He verbalized understanding.   His bowel issues are more under control now. Previously, the maximum duration for improvement was about a year, but now it seems to extend to a year and a half. Although not completely resolved, he describes the condition as 'livable.' Uses lomotil as needed. Requesting a refill.   All questions answered and support provided.   Goals of care  8/27- Mr. Harkleroad and his wife are realistic in their understanding regarding his cancer and recent progression. He is clear in his expressed wishes to  continue to treat the treatable remaining hopeful for stability. His wife is hopeful that he is able to tolerate treatment. Their goal is to closely watch his quality of life ensuring he is doing as well as he can. [name] is emotional stating he knows if he does nothing he will "die" and he is not ready to "give up" yet. Support provided.     4/9- Mr. Mells wishes to be a FULL CODE and pursue treatments that can improve his level of health and functioning. He has HCPOA listing his wife as Management consultant, and Living Will documents on file. Patient and wife interested in MOST form to help consolidate wishes onto one form that could be honored by medical providers/EMS in the event of an emergency. They are taking form home for review and to decide if they would like to complete at next visit  I discussed the importance of continued conversation with family and their medical providers regarding overall plan of care and treatment options, ensuring decisions are within the context of the patients values and GOCs. Assessment & Plan Bowel Regimen Bowel symptoms (diarrhea) improved, manageable but not resolved. - Continue Lomotil.  Fatigue Persistent fatigue - Continue to remain as active as possible.  I will plan to see patient back in 6-8 weeks.   Patient expressed understanding and was in agreement with this plan. He also understands that He can call the clinic at any time with any questions, concerns, or complaints.   Any controlled substances utilized were prescribed in the context of palliative care. PDMP has been reviewed.   Visit consisted of counseling and education dealing with the complex and emotionally intense issues of symptom management and palliative care in the setting of serious and potentially life-threatening illness.  Willette Alma, AGPCNP-BC  Palliative Medicine Team/[facility]

## 2023-07-09 NOTE — Progress Notes (Signed)
 [facility] [phone]   [phone]  OFFICE PROGRESS NOTE  Jesse Saint, MD [address] [place] [address]  DIAGNOSIS:  1) recurrent non-small cell lung cancer, adenocarcinoma with positive mediastinal lymph node in December 2023 that was initially diagnosed as stage IIB (T1 a, N1, M0) non-small cell lung cancer, adenocarcinoma diagnosed in June 2021.  The patient also has evidence for recurrent disease in the mediastinal lymph node in January 2024 and in the right lower lobe in August 2024. 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  Biomarker Findings Tumor Mutational Burden - 20 Muts/Mb Microsatellite status - MS-Stable Genomic Findings For a complete list of the genes assayed, please refer to the Appendix. KEAP1 I443fs*17 DNMT3A E817* RB1 splice site 1960+5G>C TP53 R209* 8 Disease relevant genes with no reportable alterations: ALK, BRAF, EGFR, ERBB2, KRAS, MET, RET, ROS1  PDL1 Expression: 1 %  PRIOR THERAPY:  1) status post left upper lobectomy with lymph node dissection on December 16, 2019 under the care of Dr. Cliffton Asters.  The tumor size measured 0.9 cm but there was involvement of the level 10 L and 11 L.  2) Adjuvant systemic chemotherapy with cisplatin 75 mg/M2 and Alimta 500 mg/M2 every 3 weeks.  First dose February 04, 2020.  Status post 4 cycles. 3) Neoadjuvant treatment with imatinib 400 mg p.o. daily for the GIST tumor of the pelvic area.  First dose started July 18, 2020.  Status post 12 months of treatment. 4) status post robotic assisted very low anterior rectosigmoid resection with coloanal anastomosis, diverting loop ileostomy and wedge liver biopsy under the care of Dr. Michaell Cowing on December 14, 2021 and it showed minimal residual gastrointestinal stromal tumor. 5) concurrent chemoradiation with weekly carboplatin for AUC of 2 and paclitaxel 45 Mg/M2.  First dose May 14, 2022. 6) Imatinib 400 mg p.o.  daily 7) Systemic chemotherapy with carboplatin for AUC of 5 and Alimta 500 Mg/M2 every 3 weeks.  First dose 12/25/2022.  Status post 6 cycles.  CURRENT THERAPY: Maintenance treatment with single agent Alimta 500 Mg/M2, status post 3 cycles.  INTERVAL HISTORY: Jesse [name] 73 y.o. male returns to the clinic today for follow-up visit. Discussed the use of AI scribe software for clinical note transcription with the patient, who gave verbal consent to proceed.  History of Present Illness   Jesse [name] "Jesse [name]" is a 73 year old male with recurrent non-small cell lung cancer, adenocarcinoma, who presents for evaluation before starting cycle number four of his treatment.  He has a history of recurrent non-small cell lung cancer, adenocarcinoma, initially diagnosed in January 2024 with disease progression noted in August 2024.  He has no new symptoms since his last visit three weeks ago and has tolerated the last dose of Alimta well, with no significant side effects. Decadron has been helpful, taking 4 mg twice a day, the day before, the day of, and the day after his treatment. No nausea, vomiting, diarrhea, or headaches.  He has chronic anemia, which is being monitored. He is taking various supplements and inquires about dietary changes to improve his condition, although he dislikes red meat.  He was able to travel to [name] recently, indicating that his condition has not significantly impacted his ability to travel.        MEDICAL HISTORY: Past Medical History:  Diagnosis Date   Anemia    Cancer (HCC)    bladder cancer   COPD (chronic obstructive  pulmonary disease) (HCC)    mild    DIVERTICULOSIS, COLON 01/14/2007   Qualifier: Diagnosis of  By: Marcelyn Ditty RN, Katy Fitch    GASTRITIS, CHRONIC 06/01/2004   Qualifier: Diagnosis of  By: Creta Levin CMA (AAMA), Robin     GERD 01/09/2007   Qualifier: Diagnosis of  By: Tawanna Cooler, RN, Ellen     Heart murmur    as a child; no murmur heard 12/14/19    History of radiation therapy    Mediastinum- 05/15/22-06/28/22-Dr. Antony Blackbird   HYPERLIPIDEMIA 01/09/2007   Qualifier: Diagnosis of  By: Tawanna Cooler RN, Alvino Chapel     Hypertension    Lung mass    left upper nodule   Macular degeneration    right eye   NEOPLASM, MALIGNANT, BLADDER, HX OF 2002   Qualifier: Diagnosis of  By: Creta Levin CMA (AAMA), Robin  / no chemo or radiation   OSTEOARTHRITIS 01/14/2007   Qualifier: Diagnosis of  By: Marcelyn Ditty, RN, Katy Fitch - knees   Rectal mass    Reflux esophagitis 06/01/2004   Qualifier: Diagnosis of  By: Creta Levin CMA Duncan Dull), Robin     Stroke [facility])    2024   TOBACCO USER 02/16/2009   Qualifier: Diagnosis of  By: Amador Cunas  MD, Janett Labella    VITAMIN D DEFICIENCY 06/03/2007   Qualifier: Diagnosis of  By: Amador Cunas  MD, Janett Labella     ALLERGIES:  has no known allergies.  MEDICATIONS:  Current Outpatient Medications  Medication Sig Dispense Refill   acetaminophen (TYLENOL) 500 MG tablet Take 1,000 mg by mouth every 8 (eight) hours as needed for moderate pain.     aspirin EC 81 MG tablet Take 81 mg by mouth daily. Swallow whole.     atorvastatin (LIPITOR) 20 MG tablet Take 1 tablet (20 mg total) by mouth daily. 90 tablet 3   Cholecalciferol (VITAMIN D3 PO) Take 2,000 Units by mouth daily.     dexamethasone (DECADRON) 4 MG tablet PLEASE TAKE 1 TABLET TWICE A DAY THE DAY BEFORE, THE DAY OF, AND THE DAY AFTER CHEMOTHERAPY 60 tablet 0   diphenoxylate-atropine (LOMOTIL) 2.5-0.025 MG tablet Take 2 tablets by mouth 3 (three) times daily as needed for diarrhea or loose stools. 90 tablet 0   ferrous sulfate 325 (65 FE) MG EC tablet Take 1 tablet (325 mg total) by mouth 2 (two) times daily. 60 tablet 1   folic acid (FOLVITE) 1 MG tablet TAKE 1 TABLET BY MOUTH EVERY DAY 90 tablet 1   gabapentin (NEURONTIN) 100 MG capsule Take 1 capsule (100 mg total) by mouth at bedtime. 30 capsule 3   lidocaine (XYLOCAINE) 5 % ointment Apply 1 Application topically 3 (three) times daily  as needed for mild pain or moderate pain. 35.44 g 3   lidocaine-prilocaine (EMLA) cream Apply 1 Application topically as needed. 30 g 2   loperamide (IMODIUM A-D) 2 MG tablet Take 4 mg by mouth 4 (four) times daily as needed for diarrhea or loose stools.     mirtazapine (REMERON SOL-TAB) 30 MG disintegrating tablet TAKE 1 TABLET BY MOUTH AT BEDTIME. 90 tablet 1   Multiple Vitamins-Minerals (PRESERVISION AREDS 2) CAPS Take 1 capsule by mouth 2 (two) times daily.     OVER THE COUNTER MEDICATION Take 1 tablet by mouth daily. PROBIOTICS     OVER THE COUNTER MEDICATION Fiber and spice pt take every morning     OVER THE COUNTER MEDICATION Juce pt takes every morning     pantoprazole (PROTONIX) 40 MG  tablet Take 1 tablet (40 mg total) by mouth 2 (two) times daily. 60 tablet 6   prochlorperazine (COMPAZINE) 10 MG tablet Take 1 tablet (10 mg total) by mouth every 6 (six) hours as needed for nausea or vomiting. 30 tablet 0   No current facility-administered medications for this visit.    SURGICAL HISTORY:  Past Surgical History:  Procedure Laterality Date   BIOPSY  06/18/2022   Procedure: BIOPSY;  Surgeon: Shellia Cleverly, DO;  Location: [facility];  Service: Gastroenterology;;   BREAST BIOPSY Left 03/16/2022   [place] LT RADIOACTIVE SEED LOC 03/16/2022 [facility]   BRONCHIAL BIOPSY  10/13/2019   Procedure: BRONCHIAL BIOPSIES;  Surgeon: Josephine Igo, DO;  Location: [facility];  Service: Pulmonary;;   BRONCHIAL BIOPSY  12/04/2022   Procedure: BRONCHIAL BIOPSIES;  Surgeon: Josephine Igo, DO;  Location: [facility];  Service: Pulmonary;;   BRONCHIAL BRUSHINGS  10/13/2019   Procedure: BRONCHIAL BRUSHINGS;  Surgeon: Josephine Igo, DO;  [facility]: [facility];  Service: Pulmonary;;   BRONCHIAL NEEDLE ASPIRATION BIOPSY  10/13/2019   Procedure: BRONCHIAL NEEDLE ASPIRATION BIOPSIES;  Surgeon: Josephine Igo, DO;  [facility]: [facility];  Service: Pulmonary;;   BRONCHIAL NEEDLE ASPIRATION  BIOPSY  04/11/2022   Procedure: BRONCHIAL NEEDLE ASPIRATION BIOPSIES;  Surgeon: Josephine Igo, DO;  Location: [facility];  Service: Cardiopulmonary;;   BRONCHIAL NEEDLE ASPIRATION BIOPSY  12/04/2022   Procedure: BRONCHIAL NEEDLE ASPIRATION BIOPSIES;  Surgeon: Josephine Igo, DO;  [facility];  Service: Pulmonary;;   BRONCHIAL WASHINGS  10/13/2019   Procedure: BRONCHIAL WASHINGS;  Surgeon: Josephine Igo, DO;  [facility];  Service: Pulmonary;;   BRONCHIAL WASHINGS  12/04/2022   Procedure: BRONCHIAL WASHINGS;  Surgeon: Josephine Igo, DO;  [facility]: [facility];  Service: Pulmonary;;   COLONOSCOPY     multiple   DIVERTING ILEOSTOMY N/A 12/14/2021   Procedure: DIVERTING ILEOSTOMY;  Surgeon: Karie Soda, MD;  Location: [facility];  Service: General;  Laterality: N/A;   ENDOBRONCHIAL ULTRASOUND Bilateral 04/11/2022   Procedure: ENDOBRONCHIAL ULTRASOUND;  Surgeon: Josephine Igo, DO;  [facility]: [facility];  Service: Cardiopulmonary;  Laterality: Bilateral;   ESOPHAGEAL DILATION  06/18/2022   Procedure: ESOPHAGEAL DILATION;  Surgeon: Shellia Cleverly, DO;  [facility]: [facility];  Service: Gastroenterology;;   ESOPHAGOGASTRODUODENOSCOPY (EGD) WITH PROPOFOL N/A 06/18/2022   Procedure: ESOPHAGOGASTRODUODENOSCOPY (EGD) WITH PROPOFOL;  Surgeon: Shellia Cleverly, DO;  [facility]: [facility];  Service: Gastroenterology;  Laterality: N/A;  Severe dysphagia/odynophagia ?radiation esophagitis versus oral candidiasis   FIDUCIAL MARKER PLACEMENT  12/04/2022   Procedure: FIDUCIAL MARKER PLACEMENT;  Surgeon: Josephine Igo, DO;  [facility];  Service: Pulmonary;;   ILEOSTOMY CLOSURE N/A 03/22/2022   Procedure: OPEN TAKEDOWN OF LOOP ILEOSTOMY;  Surgeon: Karie Soda, MD;  Location: [facility];  Service: General;  Laterality: N/A;  GEN w/ERAS PATHWAY LOCAL   INTERCOSTAL NERVE BLOCK Left 12/16/2019   Procedure: INTERCOSTAL NERVE BLOCK;  Surgeon: Corliss Skains, MD;   [facility]: [facility];  Service: Thoracic;  Laterality: Left;   IR FLUORO RM 30-60 MIN  07/02/2022   IR IMAGING GUIDED PORT INSERTION  05/21/2023   KNEE SURGERY  07/20/2008   bilat.   NASAL SEPTUM SURGERY  1995   NODE DISSECTION Left 12/16/2019   Procedure: NODE DISSECTION;  Surgeon: Corliss Skains, MD;  [facility];  Service: Thoracic;  Laterality: Left;   PEG PLACEMENT N/A 07/04/2022   Procedure: PERCUTANEOUS ENDOSCOPIC GASTROSTOMY (PEG) PLACEMENT;  Surgeon:  Iva Boop, MD;  Location: [facility];  Service: Gastroenterology;  Laterality: N/A;   RADIOACTIVE SEED GUIDED AXILLARY SENTINEL LYMPH NODE Left 03/20/2022   Procedure: RADIOACTIVE SEED GUIDED LEFT AXILLARY SENTINEL LYMPH NODE BIOPSY;  Surgeon: Harriette Bouillon, MD;  [facility]: [facility];  Service: General;  Laterality: Left;   RECTAL EXAM UNDER ANESTHESIA N/A 03/22/2022   Procedure: ANORECTAL EXAMINATION UNDER ANESTHESIA;  Surgeon: Karie Soda, MD;  Location: [facility];  Service: General;  Laterality: N/A;   TONSILLECTOMY     TRANSURETHRAL RESECTION OF BLADDER TUMOR  2002   UPPER GASTROINTESTINAL ENDOSCOPY     VIDEO BRONCHOSCOPY  04/11/2022   Procedure: VIDEO BRONCHOSCOPY;  Surgeon: Josephine Igo, DO;  [facility]: [facility];  Service: Cardiopulmonary;;   VIDEO BRONCHOSCOPY WITH ENDOBRONCHIAL NAVIGATION N/A 10/13/2019   Procedure: VIDEO BRONCHOSCOPY WITH ENDOBRONCHIAL NAVIGATION;  Surgeon: Josephine Igo, DO;  [facility];  Service: Pulmonary;  Laterality: N/A;   WISDOM TOOTH EXTRACTION     XI ROBOTIC ASSISTED LOWER ANTERIOR RESECTION N/A 12/14/2021   Procedure: XI ROBOTIC ASSISTED LOWER ANTERIOR ULTRA LOW RECTOSIGMOID RESECTION, COLOANAL HAND SEWN ANASTOMOSIS, AND BILATERAL TAP BLOCK;  Surgeon: Karie Soda, MD;  Location: [facility];  Service: General;  Laterality: N/A;    REVIEW OF SYSTEMS:  A comprehensive review of systems was negative except for: Constitutional: positive for fatigue   PHYSICAL EXAMINATION: General  appearance: alert, cooperative, fatigued, and no distress Head: Normocephalic, without obvious abnormality, atraumatic Neck: no adenopathy, no JVD, supple, symmetrical, trachea midline, and thyroid not enlarged, symmetric, no tenderness/mass/nodules Lymph nodes: Cervical, supraclavicular, and axillary nodes Jesse. Resp: clear to auscultation bilaterally Back: symmetric, no curvature. ROM Jesse. No CVA tenderness. Cardio: regular rate and rhythm, S1, S2 Jesse, no murmur, click, rub or gallop GI: soft, non-tender; bowel sounds Jesse; no masses,  no organomegaly Extremities: extremities Jesse, atraumatic, no cyanosis or edema  ECOG PERFORMANCE STATUS: 1 - Symptomatic but completely ambulatory  Blood pressure 122/76, pulse (!) 114, temperature 98.2 F (36.8 C), temperature source Temporal, resp. rate 18, weight 134 lb 3.2 oz (60.9 kg), SpO2 98%.  LABORATORY DATA: Lab Results  Component Value Date   WBC 7.4 07/09/2023   HGB 8.3 (L) 07/09/2023   HCT 26.6 (L) 07/09/2023   MCV 93.7 07/09/2023   PLT 380 07/09/2023      Chemistry      Component Value Date/Time   NA 140 06/18/2023 1015   K 3.7 06/18/2023 1015   CL 106 06/18/2023 1015   CO2 28 06/18/2023 1015   BUN 29 (H) 06/18/2023 1015   CREATININE 0.62 06/18/2023 1015      Component Value Date/Time   CALCIUM 9.5 06/18/2023 1015   ALKPHOS 125 06/18/2023 1015   AST 28 06/18/2023 1015   ALT 31 06/18/2023 1015   BILITOT 0.3 06/18/2023 1015       RADIOGRAPHIC STUDIES: No results found.   ASSESSMENT AND PLAN: This is a very pleasant 73 years old white male recently diagnosed with a stage IIb (T1 a, N1, M0) non-small cell lung cancer, adenocarcinoma in June 2021 status post left upper lobectomy with lymph node dissection on December 16, 2019 under the care of Dr. Cliffton Asters.  The patient has no actionable mutations and PD-L1 expression was 1%. He was also diagnosed with pelvic GIST tumor. The patient completed a course of  adjuvant systemic chemotherapy with cisplatin 75 mg/M2 and Alimta 500 mg/M2 every 3 weeks status post 4 cycles.  He tolerated his treatment well except for  fatigue and occasional nausea. For the pelvic GIST tumor, the patient completed neoadjuvant treatment with imatinib 400 mg p.o. daily in April 2023.  Status post 24 months.   The patient underwent surgical resection of the remaining gastrointestinal stromal tumor under the care of Dr. Michaell Cowing on December 14, 2021 with very minimal residual disease.  He resumed his treatment with imatinib and tolerating it fairly well. Repeat CT scan of the chest for restaging of his lung cancer. There was increased density of soft tissue in the left hilar region concerning for possible developing lymphadenopathy or disease recurrence.  A PET scan was performed recently and that showed new hypermetabolic left axillary, mediastinal and left hilar adenopathy compatible with recurrent metastatic lung cancer no hypermetabolic metastatic disease in the neck, abdomen, pelvis or skeleton. He underwent excisional biopsy of the left axillary lymph node that was not conclusive for malignancy. The patient was seen by Dr. Tonia Brooms and repeat video bronchoscopy with EBUS on April 11, 2022 and biopsy from the station 7 lymph node showed recurrent adenocarcinoma. I had a lengthy discussion with the patient today about his current condition and treatment options. I recommended for the patient a course of concurrent chemoradiation with weekly carboplatin for AUC of 2 and paclitaxel 45 Mg/M2 for 6-7 weeks.  Status post 3 cycles of chemotherapy but he completed the course of radiation.  The patient was admitted to the hospital on June 07, 2022 with significant sepsis and Klebsiella pneumonia bacteremia.  He continues to have persistent diarrhea despite discontinuation of imatinib and treatment with cholestyramine.  This is likely functional diarrhea after his surgical resection for the  pelvic GIST tumor. Repeat PET scan showed no concerning findings for disease progression except for suspicious slowly growing adenocarcinoma in the right lower lobe but inflammatory changes in the right lung as well as suspicious reactive mediastinal lymphadenopathy.  The patient had repeat bronchoscopy by Dr. Tonia Brooms and the final pathology was consistent with recurrent non-small cell lung cancer. The patient started systemic chemotherapy with carboplatin for AUC of 5 and Alimta 500 Mg/M2 on 12/25/2022.  Status post 6 cycles.  He is currently on maintenance treatment with single agent Alimta every 3 weeks status post 3 cycles. He has been tolerating this treatment well with no concerning adverse effect except for the baseline fatigue. He has   Recurrent non-small cell lung cancer, adenocarcinoma Recurrent non-small cell lung cancer, adenocarcinoma, initially diagnosed in January 2024 with disease progression in August 2024. Currently undergoing maintenance treatment with Alimta (pemetrexed) and has completed three cycles. No significant side effects from the last cycle, and he is tolerating the treatment well with Decadron (dexamethasone) for side effect management. Scheduled to start the fourth cycle. - Administer fourth cycle of Alimta as maintenance treatment. - Continue Decadron 4 mg twice a day the day before, day of, and day after Alimta infusion to manage side effects. - Perform a scan 10 days prior to the next cycle to assess disease status.  Anemia Anemia likely secondary to chronic disease, possibly related to the underlying cancer or treatment. He continues to experience anemia despite supplements. Dietary advice was provided, but he dislikes red meat, a recommended iron source. - Encourage dietary intake of iron-rich foods, considering preferences. - Monitor hemoglobin levels regularly.  Financial concerns related to treatment costs He expressed concerns about the cost of Alimta  infusions. Advised to contact the financial office for assistance. - Provide contact information for the financial office to discuss potential financial assistance options.  Follow-up Follow-up plan discussed to ensure continuity of care and monitoring of treatment response. - Schedule follow-up appointment in three weeks. - Ensure he contacts the office if not reached by the financial office by next week.     The patient was advised to call immediately if he has any concerning symptoms in the interval.  The patient voices understanding of current disease status and treatment options and is in agreement with the current care plan.  All questions were answered. The patient knows to call the clinic with any problems, questions or concerns. We can certainly see the patient much sooner if necessary. The total time spent in the appointment was 20 minutes.  Disclaimer: This note was dictated with voice recognition software. Similar sounding words can inadvertently be transcribed and may not be corrected upon review.

## 2023-07-15 DIAGNOSIS — M9903 Segmental and somatic dysfunction of lumbar region: Secondary | ICD-10-CM | POA: Diagnosis not present

## 2023-07-15 DIAGNOSIS — M5431 Sciatica, right side: Secondary | ICD-10-CM | POA: Diagnosis not present

## 2023-07-17 ENCOUNTER — Ambulatory Visit (INDEPENDENT_AMBULATORY_CARE_PROVIDER_SITE_OTHER): Payer: Medicare Other | Admitting: Family Medicine

## 2023-07-17 ENCOUNTER — Encounter: Payer: Self-pay | Admitting: Family Medicine

## 2023-07-17 VITALS — BP 108/68 | HR 127 | Temp 98.5°F | Ht 71.0 in | Wt 131.6 lb

## 2023-07-17 DIAGNOSIS — R0602 Shortness of breath: Secondary | ICD-10-CM

## 2023-07-17 DIAGNOSIS — E43 Unspecified severe protein-calorie malnutrition: Secondary | ICD-10-CM

## 2023-07-17 DIAGNOSIS — R630 Anorexia: Secondary | ICD-10-CM

## 2023-07-17 DIAGNOSIS — R059 Cough, unspecified: Secondary | ICD-10-CM

## 2023-07-17 DIAGNOSIS — G629 Polyneuropathy, unspecified: Secondary | ICD-10-CM | POA: Diagnosis not present

## 2023-07-17 DIAGNOSIS — Z515 Encounter for palliative care: Secondary | ICD-10-CM

## 2023-07-17 DIAGNOSIS — R413 Other amnesia: Secondary | ICD-10-CM | POA: Diagnosis not present

## 2023-07-17 DIAGNOSIS — C3490 Malignant neoplasm of unspecified part of unspecified bronchus or lung: Secondary | ICD-10-CM | POA: Diagnosis not present

## 2023-07-17 MED ORDER — GABAPENTIN 100 MG PO CAPS: 100.0000 mg | ORAL_CAPSULE | Freq: Every day | ORAL | 3 refills | Status: DC

## 2023-07-17 NOTE — Progress Notes (Signed)
 Established Patient Office Visit   Subjective  Patient ID: Jesse [name], male    DOB: 04-05-1951  Age: 73 y.o. MRN: [id number]  Chief Complaint  Patient presents with   Follow-up    5 week follow-up for Neuropathy, patient states he is doing better, less pain,    Shortness of Breath    Patient is a 73 year old male seen for follow-up.  Patient endorses SOB x 2 weeks.  Can occur at rest or with activity.  Patient also with productive cough times months with yellow sputum.  Unsure if he has seasonal allergies.  Has CT chest scheduled for tomorrow.  States lung cancer and left lung has now spread to right lobe.  Patient states today is not a good day.  Feels off, having trouble with memory.  Patient receiving B12 injections with infusion.  Not sure if levels have been checked recently.  States does not have an appetite.  Food does not taste the same.  Stopped drinking boost/ensure supplemental drinks as thought were making him not want food.  No change in appetite noted after stopping drinks x 1-2 wks.  Pt notes improvement in neuropathy since starting gabapentin 100 mg nightly.  States generally feels sleepy during the day but unsure medication is making any worse.  Currently sleeping 15 hours/day.  Patient states he is not afraid to die but was hoping his quality of life would be better than what it is.  Unable to work outside in his garden due to becoming extremely weak after 10 to 20 minutes.  Of note: While LPN rooming patient he was seen placing his cell phone and glasses in his bag.  He then stood up abruptly as if he had to rush out the room.  When asked what was wrong he stated he left his cell phone in classes somewhere.  Patient was advised by the LPN that he just placed them in his bag.    Patient Active Problem List   Diagnosis Date Noted   Port-A-Cath in place 05/28/2023   Recurrent non-small cell lung cancer (HCC) 03/05/2023   Acute ischemic stroke, likely embolic 07/02/2022    Esophageal dysphagia 06/18/2022   Esophageal stricture 06/18/2022   Hiatal hernia 06/18/2022   Radiation-induced esophagitis 06/18/2022   Pancytopenia due to chemotherapy 06/07/2022   Protein-calorie malnutrition, severe 06/07/2022   Severe sepsis due to Klebsiella pneumonia and bacteremia 06/05/2022   AKI (acute kidney injury) (HCC) 06/05/2022   Hyponatremia 06/05/2022   Acute metabolic encephalopathy 06/05/2022   Acute respiratory failure with hypoxia (HCC) 06/05/2022   Acute on chronic diarrhea 06/05/2022   Weight loss 05/28/2022   Malignant neoplasm of lung (HCC) 04/30/2022   Adenopathy 04/06/2022   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   Encounter for antineoplastic chemotherapy 01/28/2020   Adenocarcinoma of left lung, stage 2 (HCC) 12/31/2019   Goals of care, counseling/discussion 12/31/2019   S/P lobectomy of lung 12/16/2019   Lung nodule 10/13/2019   Essential hypertension 09/28/2016   Coronary artery calcification 09/28/2016   Impaired glucose tolerance 09/28/2016   Vitamin D deficiency 06/03/2007   Diverticulosis of colon 01/14/2007   Dyslipidemia 01/09/2007   GERD 01/09/2007   REFLUX ESOPHAGITIS 06/01/2004   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   Past Medical History:  Diagnosis Date   Anemia    Cancer (HCC)    bladder cancer   COPD (chronic obstructive pulmonary disease) (HCC)    mild    DIVERTICULOSIS, COLON 01/14/2007   Qualifier: Diagnosis  of  By: Marcelyn Ditty RN, Katy Fitch    GASTRITIS, CHRONIC 06/01/2004   Qualifier: Diagnosis of  By: Creta Levin CMA (AAMA), Robin     GERD 01/09/2007   Qualifier: Diagnosis of  By: Tawanna Cooler RN, Ellen     Heart murmur    as a child; no murmur heard 12/14/19   History of radiation therapy    Mediastinum- 05/15/22-06/28/22-Dr. Antony Blackbird   HYPERLIPIDEMIA 01/09/2007   Qualifier: Diagnosis of  By: Tawanna Cooler RN, Alvino Chapel     Hypertension    Lung mass    left upper nodule   Macular degeneration    right eye    NEOPLASM, MALIGNANT, BLADDER, HX OF 2002   Qualifier: Diagnosis of  By: Creta Levin CMA (AAMA), Robin  / no chemo or radiation   OSTEOARTHRITIS 01/14/2007   Qualifier: Diagnosis of  By: Marcelyn Ditty, RN, Katy Fitch - knees   Rectal mass    Reflux esophagitis 06/01/2004   Qualifier: Diagnosis of  By: Creta Levin CMA Duncan Dull), Robin     Stroke [name])    2024   TOBACCO USER 02/16/2009   Qualifier: Diagnosis of  By: Amador Cunas  MD, Janett Labella    VITAMIN D DEFICIENCY 06/03/2007   Qualifier: Diagnosis of  By: Amador Cunas  MD, Janett Labella    Past Surgical History:  Procedure Laterality Date   BIOPSY  06/18/2022   Procedure: BIOPSY;  Surgeon: Shellia Cleverly, DO;  Location: [facility];  Service: Gastroenterology;;   BREAST BIOPSY Left 03/16/2022   [place] LT RADIOACTIVE SEED LOC 03/16/2022 [facility]   BRONCHIAL BIOPSY  10/13/2019   Procedure: BRONCHIAL BIOPSIES;  Surgeon: Josephine Igo, DO;  Location: [facility];  Service: Pulmonary;;   BRONCHIAL BIOPSY  12/04/2022   Procedure: BRONCHIAL BIOPSIES;  Surgeon: Josephine Igo, DO;  [facility]: [facility];  Service: Pulmonary;;   BRONCHIAL BRUSHINGS  10/13/2019   Procedure: BRONCHIAL BRUSHINGS;  Surgeon: Josephine Igo, DO;  [facility];  Service: Pulmonary;;   BRONCHIAL NEEDLE ASPIRATION BIOPSY  10/13/2019   Procedure: BRONCHIAL NEEDLE ASPIRATION BIOPSIES;  Surgeon: Josephine Igo, DO;  [facility];  Service: Pulmonary;;   BRONCHIAL NEEDLE ASPIRATION BIOPSY  04/11/2022   Procedure: BRONCHIAL NEEDLE ASPIRATION BIOPSIES;  Surgeon: Josephine Igo, DO;  [facility]: [facility];  Service: Cardiopulmonary;;   BRONCHIAL NEEDLE ASPIRATION BIOPSY  12/04/2022   Procedure: BRONCHIAL NEEDLE ASPIRATION BIOPSIES;  Surgeon: Josephine Igo, DO;  [facility];  Service: Pulmonary;;   BRONCHIAL WASHINGS  10/13/2019   Procedure: BRONCHIAL WASHINGS;  Surgeon: Josephine Igo, DO;  [facility];  Service: Pulmonary;;   BRONCHIAL  WASHINGS  12/04/2022   Procedure: BRONCHIAL WASHINGS;  Surgeon: Josephine Igo, DO;  [facility];  Service: Pulmonary;;   COLONOSCOPY     multiple   DIVERTING ILEOSTOMY N/A 12/14/2021   Procedure: DIVERTING ILEOSTOMY;  Surgeon: Karie Soda, MD;  [facility]: [facility];  Service: General;  Laterality: N/A;   ENDOBRONCHIAL ULTRASOUND Bilateral 04/11/2022   Procedure: ENDOBRONCHIAL ULTRASOUND;  Surgeon: Josephine Igo, DO;  [facility];  Service: Cardiopulmonary;  Laterality: Bilateral;   ESOPHAGEAL DILATION  06/18/2022   Procedure: ESOPHAGEAL DILATION;  Surgeon: Shellia Cleverly, DO;  [facility];  Service: Gastroenterology;;   ESOPHAGOGASTRODUODENOSCOPY (EGD) WITH PROPOFOL N/A 06/18/2022   Procedure: ESOPHAGOGASTRODUODENOSCOPY (EGD) WITH PROPOFOL;  Surgeon: Shellia Cleverly, DO;  [facility]: [facility];  Service: Gastroenterology;  Laterality: N/A;  Severe dysphagia/odynophagia ?radiation esophagitis versus oral candidiasis   FIDUCIAL MARKER PLACEMENT  12/04/2022   Procedure: FIDUCIAL MARKER PLACEMENT;  Surgeon: Josephine Igo, DO;  [facility];  Service: Pulmonary;;   ILEOSTOMY CLOSURE N/A 03/22/2022   Procedure: OPEN TAKEDOWN OF LOOP ILEOSTOMY;  Surgeon: Karie Soda, MD;  Location: [facility];  Service: General;  Laterality: N/A;  GEN w/ERAS PATHWAY LOCAL   INTERCOSTAL NERVE BLOCK Left 12/16/2019   Procedure: INTERCOSTAL NERVE BLOCK;  Surgeon: Corliss Skains, MD;  [facility];  Service: Thoracic;  Laterality: Left;   IR FLUORO RM 30-60 MIN  07/02/2022   IR IMAGING GUIDED PORT INSERTION  05/21/2023   KNEE SURGERY  07/20/2008   bilat.   NASAL SEPTUM SURGERY  1995   NODE DISSECTION Left 12/16/2019   Procedure: NODE DISSECTION;  Surgeon: Corliss Skains, MD;  [facility];  Service: Thoracic;  Laterality: Left;   PEG PLACEMENT N/A 07/04/2022   Procedure: PERCUTANEOUS ENDOSCOPIC GASTROSTOMY (PEG) PLACEMENT;  Surgeon: Iva Boop, MD;   [facility]: [facility];  Service: Gastroenterology;  Laterality: N/A;   RADIOACTIVE SEED GUIDED AXILLARY SENTINEL LYMPH NODE Left 03/20/2022   Procedure: RADIOACTIVE SEED GUIDED LEFT AXILLARY SENTINEL LYMPH NODE BIOPSY;  Surgeon: Harriette Bouillon, MD;  [facility];  Service: General;  Laterality: Left;   RECTAL EXAM UNDER ANESTHESIA N/A 03/22/2022   Procedure: ANORECTAL EXAMINATION UNDER ANESTHESIA;  Surgeon: Karie Soda, MD;  [facility];  Service: General;  Laterality: N/A;   TONSILLECTOMY     TRANSURETHRAL RESECTION OF BLADDER TUMOR  2002   UPPER GASTROINTESTINAL ENDOSCOPY     VIDEO BRONCHOSCOPY  04/11/2022   Procedure: VIDEO BRONCHOSCOPY;  Surgeon: Josephine Igo, DO;  [facility]: [facility];  Service: Cardiopulmonary;;   VIDEO BRONCHOSCOPY WITH ENDOBRONCHIAL NAVIGATION N/A 10/13/2019   Procedure: VIDEO BRONCHOSCOPY WITH ENDOBRONCHIAL NAVIGATION;  Surgeon: Josephine Igo, DO;  [facility];  Service: Pulmonary;  Laterality: N/A;   WISDOM TOOTH EXTRACTION     XI ROBOTIC ASSISTED LOWER ANTERIOR RESECTION N/A 12/14/2021   Procedure: XI ROBOTIC ASSISTED LOWER ANTERIOR ULTRA LOW RECTOSIGMOID RESECTION, COLOANAL HAND SEWN ANASTOMOSIS, AND BILATERAL TAP BLOCK;  Surgeon: Karie Soda, MD;  Location: [facility];  Service: General;  Laterality: N/A;   Social History   Tobacco Use   Smoking status: Former    Current packs/day: 0.00    Average packs/day: 0.5 packs/day for 40.0 years (20.0 ttl pk-yrs)    Types: Cigarettes    Start date: 09/21/1979    Quit date: 09/21/2019    Years since quitting: 3.8   Smokeless tobacco: Never  Vaping Use   Vaping status: Never Used  Substance Use Topics   Alcohol use: Not Currently    Alcohol/week: 15.0 standard drinks of alcohol    Types: 15 Standard drinks or equivalent per week    Comment: wine/beer/martini   Drug use: Not Currently    Types: Marijuana    Comment: Smokes marijuana occasional, last use in 08/2019   Family History  Problem  Relation Age of Onset   Dementia Mother    Diabetes Father    Mental illness Father    Peripheral vascular disease Brother    Colon cancer Neg Hx    Esophageal cancer Neg Hx    Stomach cancer Neg Hx    Rectal cancer Neg Hx    No Known Allergies    ROS Negative unless stated above    Objective:     BP 108/68 (BP Location: Left Arm, Patient Position: Sitting, Cuff Size: Normal)   Pulse (!) 127  Temp 98.5 F (36.9 C) (Oral)   Ht 5\' 11"  (1.803 m)   Wt 131 lb 9.6 oz (59.7 kg)   SpO2 95%   BMI 18.35 kg/m  BP Readings from Last 3 Encounters:  07/17/23 108/68  07/09/23 122/76  06/18/23 128/80   Wt Readings from Last 3 Encounters:  07/17/23 131 lb 9.6 oz (59.7 kg)  07/09/23 134 lb 3.2 oz (60.9 kg)  06/18/23 134 lb (60.8 kg)  6-minute walk test performed.  Documented in extended vitals.  Physical Exam Constitutional:      General: He is awake. He is not in acute distress.    Appearance: He is underweight.     Comments: Appears frail.  Temporal wasting noted.  HENT:     Head: Normocephalic and atraumatic.  Cardiovascular:     Rate and Rhythm: Tachycardia present.  Pulmonary:     Effort: Pulmonary effort is normal.     Breath sounds: Normal breath sounds. No decreased breath sounds, wheezing, rhonchi or rales.  Skin:    General: Skin is warm and dry.  Neurological:     Mental Status: He is alert and oriented to person, place, and time.     Motor: No weakness.  Psychiatric:        Attention and Perception: Attention normal.        Mood and Affect: Mood is not depressed.        Speech: Speech normal.        Behavior: Behavior normal. Behavior is cooperative.        Cognition and Memory: Cognition is impaired. He exhibits impaired recent memory.     Comments: Word finding.      No results found for any visits on 07/17/23.    Assessment & Plan:  SOB (shortness of breath) -     For home use only DME Other see comment  Neuropathy -     Gabapentin; Take 1  capsule (100 mg total) by mouth at bedtime.  Dispense: 90 capsule; Refill: 3 -     Vitamin B12; Future  Protein-calorie malnutrition, severe  Malignant neoplasm of lung, unspecified laterality, unspecified part of lung (HCC)  Palliative care patient  Decreased appetite  Cough, unspecified type  Memory change -     Vitamin B12; Future  Patient is a 73 year old male seen for follow-up.  Malnutrition noted as patient is frail and thin.  Encouraged to reach start supplemental drinks such as boost and Ensure.  Continue gabapentin as noticing improvement in neuropathy.  Continue with palliative care in hopes to improve quality of life.  Patient with concerning productive cough.  Discussed possible causes including allergies, infection, effusion from worsening malignancy.  CXR was not ordered this visit as patient has CT chest as well as CT abdomen pelvis scheduled for tomorrow.  Will try half tab fexofenadine to see if allergy related.  Given strict precautions.  Further recommendations based on imaging results tomorrow.  Continue follow-up with oncology and GI.  Pt's O2 sats 95% on room air.  O2 sats dropped to 71% during 6-minute walk test in clinic.  O2 stats increased to 90% on 2 L.  Discussed setting up O2 for use with exertion.  Change in memory/confusion and word finding noted during visit.  Discussed rechecking vitamin B12 levels as well as other labs.  Patient wishes to wait as he has labs at upcoming appointment.  Continue to monitor.  Return in about 5 weeks (around 08/21/2023).  Sooner if needed.  Carollee Herter  Kathrene Bongo, MD

## 2023-07-18 ENCOUNTER — Ambulatory Visit (HOSPITAL_COMMUNITY)
Admission: RE | Admit: 2023-07-18 | Discharge: 2023-07-18 | Disposition: A | Discharge status: Home / Self Care | Source: Ambulatory Visit | Attending: Internal Medicine | Admitting: Internal Medicine

## 2023-07-18 ENCOUNTER — Other Ambulatory Visit: Payer: Self-pay | Admitting: Internal Medicine

## 2023-07-18 DIAGNOSIS — J90 Pleural effusion, not elsewhere classified: Secondary | ICD-10-CM | POA: Diagnosis not present

## 2023-07-18 DIAGNOSIS — C3490 Malignant neoplasm of unspecified part of unspecified bronchus or lung: Secondary | ICD-10-CM | POA: Insufficient documentation

## 2023-07-18 DIAGNOSIS — J439 Emphysema, unspecified: Secondary | ICD-10-CM | POA: Diagnosis not present

## 2023-07-18 DIAGNOSIS — I700 Atherosclerosis of aorta: Secondary | ICD-10-CM | POA: Diagnosis not present

## 2023-07-18 MED ORDER — HEPARIN SOD (PORK) LOCK FLUSH 100 UNIT/ML IV SOLN
500.0000 [IU] | Freq: Once | INTRAVENOUS | Status: AC
  Administered 2023-07-18: 500 [IU] via INTRAVENOUS

## 2023-07-18 MED ORDER — HEPARIN SOD (PORK) LOCK FLUSH 100 UNIT/ML IV SOLN
INTRAVENOUS | Status: AC
  Filled 2023-07-18: qty 5

## 2023-07-18 MED ORDER — IOHEXOL 300 MG/ML  SOLN
100.0000 mL | Freq: Once | INTRAMUSCULAR | Status: AC | PRN
  Administered 2023-07-18: 100 mL via INTRAVENOUS

## 2023-07-22 DIAGNOSIS — M9903 Segmental and somatic dysfunction of lumbar region: Secondary | ICD-10-CM | POA: Diagnosis not present

## 2023-07-22 DIAGNOSIS — M5431 Sciatica, right side: Secondary | ICD-10-CM | POA: Diagnosis not present

## 2023-07-26 DIAGNOSIS — H43811 Vitreous degeneration, right eye: Secondary | ICD-10-CM | POA: Diagnosis not present

## 2023-07-26 DIAGNOSIS — H2513 Age-related nuclear cataract, bilateral: Secondary | ICD-10-CM | POA: Diagnosis not present

## 2023-07-26 DIAGNOSIS — H353121 Nonexudative age-related macular degeneration, left eye, early dry stage: Secondary | ICD-10-CM | POA: Diagnosis not present

## 2023-07-26 DIAGNOSIS — H353211 Exudative age-related macular degeneration, right eye, with active choroidal neovascularization: Secondary | ICD-10-CM | POA: Diagnosis not present

## 2023-07-29 DIAGNOSIS — M9903 Segmental and somatic dysfunction of lumbar region: Secondary | ICD-10-CM | POA: Diagnosis not present

## 2023-07-29 DIAGNOSIS — M5431 Sciatica, right side: Secondary | ICD-10-CM | POA: Diagnosis not present

## 2023-07-30 ENCOUNTER — Inpatient Hospital Stay: Payer: Medicare Other | Attending: Internal Medicine

## 2023-07-30 ENCOUNTER — Inpatient Hospital Stay: Payer: Medicare Other | Admitting: Internal Medicine

## 2023-07-30 ENCOUNTER — Inpatient Hospital Stay: Payer: Medicare Other

## 2023-07-30 VITALS — BP 133/84 | HR 105 | Temp 98.0°F | Resp 17 | Ht 71.0 in | Wt 130.7 lb

## 2023-07-30 VITALS — BP 120/61 | HR 93 | Resp 19

## 2023-07-30 DIAGNOSIS — Z923 Personal history of irradiation: Secondary | ICD-10-CM | POA: Insufficient documentation

## 2023-07-30 DIAGNOSIS — C3412 Malignant neoplasm of upper lobe, left bronchus or lung: Secondary | ICD-10-CM | POA: Diagnosis not present

## 2023-07-30 DIAGNOSIS — Z79899 Other long term (current) drug therapy: Secondary | ICD-10-CM | POA: Insufficient documentation

## 2023-07-30 DIAGNOSIS — Z9221 Personal history of antineoplastic chemotherapy: Secondary | ICD-10-CM | POA: Diagnosis not present

## 2023-07-30 DIAGNOSIS — C771 Secondary and unspecified malignant neoplasm of intrathoracic lymph nodes: Secondary | ICD-10-CM | POA: Insufficient documentation

## 2023-07-30 DIAGNOSIS — Z95828 Presence of other vascular implants and grafts: Secondary | ICD-10-CM

## 2023-07-30 DIAGNOSIS — C3431 Malignant neoplasm of lower lobe, right bronchus or lung: Secondary | ICD-10-CM

## 2023-07-30 DIAGNOSIS — C3491 Malignant neoplasm of unspecified part of right bronchus or lung: Secondary | ICD-10-CM | POA: Diagnosis not present

## 2023-07-30 DIAGNOSIS — Z7982 Long term (current) use of aspirin: Secondary | ICD-10-CM | POA: Diagnosis not present

## 2023-07-30 DIAGNOSIS — R197 Diarrhea, unspecified: Secondary | ICD-10-CM | POA: Insufficient documentation

## 2023-07-30 DIAGNOSIS — C3492 Malignant neoplasm of unspecified part of left bronchus or lung: Secondary | ICD-10-CM

## 2023-07-30 DIAGNOSIS — D649 Anemia, unspecified: Secondary | ICD-10-CM | POA: Diagnosis not present

## 2023-07-30 DIAGNOSIS — Z5111 Encounter for antineoplastic chemotherapy: Secondary | ICD-10-CM | POA: Insufficient documentation

## 2023-07-30 LAB — CMP (CANCER CENTER ONLY)
ALT: 26 U/L (ref 0–44)
AST: 29 U/L (ref 15–41)
Albumin: 3.3 g/dL — ABNORMAL LOW (ref 3.5–5.0)
Alkaline Phosphatase: 116 U/L (ref 38–126)
Anion gap: 6 (ref 5–15)
BUN: 28 mg/dL — ABNORMAL HIGH (ref 8–23)
CO2: 29 mmol/L (ref 22–32)
Calcium: 9.4 mg/dL (ref 8.9–10.3)
Chloride: 104 mmol/L (ref 98–111)
Creatinine: 0.64 mg/dL (ref 0.61–1.24)
GFR, Estimated: >60 mL/min (ref >60)
Glucose, Bld: 110 mg/dL — ABNORMAL HIGH (ref 70–99)
Potassium: 3.6 mmol/L (ref 3.5–5.1)
Sodium: 139 mmol/L (ref 135–145)
Total Bilirubin: 0.3 mg/dL (ref 0.0–1.2)
Total Protein: 7.4 g/dL (ref 6.5–8.1)

## 2023-07-30 LAB — CBC WITH DIFFERENTIAL (CANCER CENTER ONLY)
Abs Immature Granulocytes: 0.01 10*3/uL (ref 0.00–0.07)
Basophils Absolute: 0 10*3/uL (ref 0.0–0.1)
Basophils Relative: 0 %
Eosinophils Absolute: 0 10*3/uL (ref 0.0–0.5)
Eosinophils Relative: 1 %
HCT: 26.7 % — ABNORMAL LOW (ref 39.0–52.0)
Hemoglobin: 8.3 g/dL — ABNORMAL LOW (ref 13.0–17.0)
Immature Granulocytes: 0 %
Lymphocytes Relative: 11 %
Lymphs Abs: 0.7 10*3/uL (ref 0.7–4.0)
MCH: 29.6 pg (ref 26.0–34.0)
MCHC: 31.1 g/dL (ref 30.0–36.0)
MCV: 95.4 fL (ref 80.0–100.0)
Monocytes Absolute: 0.4 10*3/uL (ref 0.1–1.0)
Monocytes Relative: 6 %
Neutro Abs: 5.3 10*3/uL (ref 1.7–7.7)
Neutrophils Relative %: 82 %
Platelet Count: 310 10*3/uL (ref 150–400)
RBC: 2.8 MIL/uL — ABNORMAL LOW (ref 4.22–5.81)
RDW: 18.5 % — ABNORMAL HIGH (ref 11.5–15.5)
WBC Count: 6.5 10*3/uL (ref 4.0–10.5)
nRBC: 0 % (ref 0.0–0.2)

## 2023-07-30 MED ORDER — PROCHLORPERAZINE MALEATE 10 MG PO TABS
10.0000 mg | ORAL_TABLET | Freq: Once | ORAL | Status: AC
Start: 2023-07-30 — End: 2023-07-30
  Administered 2023-07-30: 10 mg via ORAL
  Filled 2023-07-30: qty 1

## 2023-07-30 MED ORDER — SODIUM CHLORIDE 0.9 % IV SOLN
500.0000 mg/m2 | Freq: Once | INTRAVENOUS | Status: AC
  Administered 2023-07-30: 900 mg via INTRAVENOUS
  Filled 2023-07-30: qty 20

## 2023-07-30 MED ORDER — SODIUM CHLORIDE 0.9% FLUSH
10.0000 mL | Freq: Once | INTRAVENOUS | Status: AC
  Administered 2023-07-30: 10 mL

## 2023-07-30 MED ORDER — SODIUM CHLORIDE 0.9% FLUSH
10.0000 mL | INTRAVENOUS | Status: DC | PRN
  Administered 2023-07-30: 10 mL

## 2023-07-30 MED ORDER — HEPARIN SOD (PORK) LOCK FLUSH 100 UNIT/ML IV SOLN
500.0000 [IU] | Freq: Once | INTRAVENOUS | Status: AC | PRN
Start: 2023-07-30 — End: 2023-07-30
  Administered 2023-07-30: 500 [IU]

## 2023-07-30 MED ORDER — SODIUM CHLORIDE 0.9 % IV SOLN
INTRAVENOUS | Status: DC
Start: 2023-07-30 — End: 2023-07-30

## 2023-07-30 NOTE — Patient Instructions (Signed)
 [facility] - A DEPT OF [facility]  Discharge Instructions: Thank you for choosing [facility] to provide your oncology and hematology care.   If you have a lab appointment with the Cancer Center, please go directly to the Cancer Center and check in at the registration area.   Wear comfortable clothing and clothing appropriate for easy access to any Portacath or PICC line.   We strive to give you quality time with your provider. You may need to reschedule your appointment if you arrive late (15 or more minutes).  Arriving late affects you and other patients whose appointments are after yours.  Also, if you miss three or more appointments without notifying the office, you may be dismissed from the clinic at the provider's discretion.      For prescription refill requests, have your pharmacy contact our office and allow 72 hours for refills to be completed.    Today you received the following chemotherapy and/or immunotherapy agents Alimta      To help prevent nausea and vomiting after your treatment, we encourage you to take your nausea medication as directed.  BELOW ARE SYMPTOMS THAT SHOULD BE REPORTED IMMEDIATELY: *FEVER GREATER THAN 100.4 F (38 C) OR HIGHER *CHILLS OR SWEATING *NAUSEA AND VOMITING THAT IS NOT CONTROLLED WITH YOUR NAUSEA MEDICATION *UNUSUAL SHORTNESS OF BREATH *UNUSUAL BRUISING OR BLEEDING *URINARY PROBLEMS (pain or burning when urinating, or frequent urination) *BOWEL PROBLEMS (unusual diarrhea, constipation, pain near the anus) TENDERNESS IN MOUTH AND THROAT WITH OR WITHOUT PRESENCE OF ULCERS (sore throat, sores in mouth, or a toothache) UNUSUAL RASH, SWELLING OR PAIN  UNUSUAL VAGINAL DISCHARGE OR ITCHING   Items with * indicate a potential emergency and should be followed up as soon as possible or go to the Emergency Department if any problems should occur.  Please show the CHEMOTHERAPY ALERT CARD or IMMUNOTHERAPY  ALERT CARD at check-in to the Emergency Department and triage nurse.  Should you have questions after your visit or need to cancel or reschedule your appointment, please contact [facility] - A DEPT OF [facility]  Dept: [phone]  and follow the prompts.  Office hours are 8:00 a.m. to 4:30 p.m. Monday - Friday. Please note that voicemails left after 4:00 p.m. may not be returned until the following business day.  We are closed weekends and major holidays. You have access to a nurse at all times for urgent questions. Please call the main number to the clinic Dept: [phone] and follow the prompts.   For any non-urgent questions, you may also contact your provider using MyChart. We now offer e-Visits for anyone 79 and older to request care online for non-urgent symptoms. For details visit [email or web address].   Also download the MyChart app! Go to the app store, search "MyChart", open the app, select [place], and log in with your MyChart username and password.

## 2023-07-30 NOTE — Progress Notes (Signed)
 [facility] Telephone:([phone]) [phone]   [phone]  OFFICE PROGRESS NOTE  Jesse Saint, MD [address] [place] [address]  DIAGNOSIS:  1) recurrent non-small cell lung cancer, adenocarcinoma with positive mediastinal lymph node in December 2023 that was initially diagnosed as stage IIB (T1 a, N1, M0) non-small cell lung cancer, adenocarcinoma diagnosed in June 2021.  The patient also has evidence for recurrent disease in the mediastinal lymph node in January 2024 and in the right lower lobe in August 2024. 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  Biomarker Findings Tumor Mutational Burden - 20 Muts/Mb Microsatellite status - MS-Stable Genomic Findings For a complete list of the genes assayed, please refer to the Appendix. KEAP1 I429fs*17 DNMT3A E817* RB1 splice site 1960+5G>C TP53 R209* 8 Disease relevant genes with no reportable alterations: ALK, BRAF, EGFR, ERBB2, KRAS, MET, RET, ROS1  PDL1 Expression: 1 %  PRIOR THERAPY:  1) status post left upper lobectomy with lymph node dissection on December 16, 2019 under the care of Dr. Cliffton Asters.  The tumor size measured 0.9 cm but there was involvement of the level 10 L and 11 L.  2) Adjuvant systemic chemotherapy with cisplatin 75 mg/M2 and Alimta 500 mg/M2 every 3 weeks.  First dose February 04, 2020.  Status post 4 cycles. 3) Neoadjuvant treatment with imatinib 400 mg p.o. daily for the GIST tumor of the pelvic area.  First dose started July 18, 2020.  Status post 12 months of treatment. 4) status post robotic assisted very low anterior rectosigmoid resection with coloanal anastomosis, diverting loop ileostomy and wedge liver biopsy under the care of Dr. Michaell Cowing on December 14, 2021 and it showed minimal residual gastrointestinal stromal tumor. 5) concurrent chemoradiation with weekly carboplatin for AUC of 2 and paclitaxel 45 Mg/M2.  First dose May 14, 2022. 6) Imatinib 400 mg p.o.  daily 7) Systemic chemotherapy with carboplatin for AUC of 5 and Alimta 500 Mg/M2 every 3 weeks.  First dose 12/25/2022.  Status post 6 cycles.  CURRENT THERAPY: Maintenance treatment with single agent Alimta 500 Mg/M2, status post 4 cycles.  INTERVAL HISTORY: Jesse [name] 74 y.o. male returns to the clinic today for follow-up visit. Discussed the use of AI scribe software for clinical note transcription with the patient, who gave verbal consent to proceed.  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  He experiences frequent bowel movements, with approximately eight occurrences so far today. The condition is described as constant with soreness, having both good and bad days, but more good days overall. His bowel movements are not related to his diet, as he consumes similar foods regularly and avoids spicy or dairy products. The diarrhea impacts his ability to maintain hygiene due to the need for frequent clean-up.  He continues to take all his prescribed medications, although the names and dosages were not specified. Despite the ongoing bowel issues, he has lost approximately one and a half pounds.  A CT scan conducted a month ago showed stable results with no significant changes in his cancer status, although there was some mention of radiation fibrosis. He also has mild anemia, which has not changed from previous assessments.        MEDICAL HISTORY: Past Medical History:  Diagnosis Date   Anemia    Cancer (HCC)    bladder cancer   COPD (  chronic obstructive pulmonary disease) (HCC)    mild    DIVERTICULOSIS, COLON 01/14/2007   Qualifier: Diagnosis of  By: Marcelyn Ditty RN, Katy Fitch    GASTRITIS, CHRONIC 06/01/2004   Qualifier: Diagnosis of  By: Creta Levin CMA (AAMA), Robin     GERD 01/09/2007   Qualifier: Diagnosis of  By: Tawanna Cooler, RN, Ellen     Heart murmur    as a child; no murmur heard  12/14/19   History of radiation therapy    Mediastinum- 05/15/22-06/28/22-Dr. Antony Blackbird   HYPERLIPIDEMIA 01/09/2007   Qualifier: Diagnosis of  By: Everett Graff     Hypertension    Lung mass    left upper nodule   Macular degeneration    right eye   NEOPLASM, MALIGNANT, BLADDER, HX OF 2002   Qualifier: Diagnosis of  By: Creta Levin CMA (AAMA), Robin  / no chemo or radiation   OSTEOARTHRITIS 01/14/2007   Qualifier: Diagnosis of  By: Marcelyn Ditty, RN, Katy Fitch - knees   Rectal mass    Reflux esophagitis 06/01/2004   Qualifier: Diagnosis of  By: Creta Levin CMA Duncan Dull), Robin     Stroke [facility])    2024   TOBACCO USER 02/16/2009   Qualifier: Diagnosis of  By: Amador Cunas  MD, Janett Labella    VITAMIN D DEFICIENCY 06/03/2007   Qualifier: Diagnosis of  By: Amador Cunas  MD, Janett Labella     ALLERGIES:  has no known allergies.  MEDICATIONS:  Current Outpatient Medications  Medication Sig Dispense Refill   acetaminophen (TYLENOL) 500 MG tablet Take 1,000 mg by mouth every 8 (eight) hours as needed for moderate pain.     aspirin EC 81 MG tablet Take 81 mg by mouth daily. Swallow whole.     atorvastatin (LIPITOR) 20 MG tablet Take 1 tablet (20 mg total) by mouth daily. 90 tablet 3   Cholecalciferol (VITAMIN D3 PO) Take 2,000 Units by mouth daily.     dexamethasone (DECADRON) 4 MG tablet PLEASE TAKE 1 TABLET TWICE A DAY THE DAY BEFORE, THE DAY OF, AND THE DAY AFTER CHEMOTHERAPY 60 tablet 0   diphenoxylate-atropine (LOMOTIL) 2.5-0.025 MG tablet Take 2 tablets by mouth 3 (three) times daily as needed for diarrhea or loose stools. 90 tablet 0   ferrous sulfate 325 (65 FE) MG EC tablet Take 1 tablet (325 mg total) by mouth 2 (two) times daily. 60 tablet 1   folic acid (FOLVITE) 1 MG tablet TAKE 1 TABLET BY MOUTH EVERY DAY 90 tablet 1   gabapentin (NEURONTIN) 100 MG capsule Take 1 capsule (100 mg total) by mouth at bedtime. 90 capsule 3   lidocaine (XYLOCAINE) 5 % ointment Apply 1 Application topically 3 (three)  times daily as needed for mild pain or moderate pain. 35.44 g 3   lidocaine-prilocaine (EMLA) cream Apply 1 Application topically as needed. 30 g 2   loperamide (IMODIUM A-D) 2 MG tablet Take 4 mg by mouth 4 (four) times daily as needed for diarrhea or loose stools.     mirtazapine (REMERON SOL-TAB) 30 MG disintegrating tablet TAKE 1 TABLET BY MOUTH AT BEDTIME. 90 tablet 1   Multiple Vitamins-Minerals (PRESERVISION AREDS 2) CAPS Take 1 capsule by mouth 2 (two) times daily.     OVER THE COUNTER MEDICATION Take 1 tablet by mouth daily. PROBIOTICS     OVER THE COUNTER MEDICATION Fiber and spice pt take every morning     OVER THE COUNTER MEDICATION Juce pt takes every morning     pantoprazole (PROTONIX)  40 MG tablet Take 1 tablet (40 mg total) by mouth 2 (two) times daily. 60 tablet 6   prochlorperazine (COMPAZINE) 10 MG tablet Take 1 tablet (10 mg total) by mouth every 6 (six) hours as needed for nausea or vomiting. 30 tablet 0   No current facility-administered medications for this visit.    SURGICAL HISTORY:  Past Surgical History:  Procedure Laterality Date   BIOPSY  06/18/2022   Procedure: BIOPSY;  Surgeon: Shellia Cleverly, DO;  Location: [facility];  Service: Gastroenterology;;   BREAST BIOPSY Left 03/16/2022   [place] LT RADIOACTIVE SEED LOC 03/16/2022 [facility]   BRONCHIAL BIOPSY  10/13/2019   Procedure: BRONCHIAL BIOPSIES;  Surgeon: Josephine Igo, DO;  [facility]: [facility];  Service: Pulmonary;;   BRONCHIAL BIOPSY  12/04/2022   Procedure: BRONCHIAL BIOPSIES;  Surgeon: Josephine Igo, DO;  [facility];  Service: Pulmonary;;   BRONCHIAL BRUSHINGS  10/13/2019   Procedure: BRONCHIAL BRUSHINGS;  Surgeon: Josephine Igo, DO;  [facility];  Service: Pulmonary;;   BRONCHIAL NEEDLE ASPIRATION BIOPSY  10/13/2019   Procedure: BRONCHIAL NEEDLE ASPIRATION BIOPSIES;  Surgeon: Josephine Igo, DO;  [facility];  Service: Pulmonary;;   BRONCHIAL NEEDLE  ASPIRATION BIOPSY  04/11/2022   Procedure: BRONCHIAL NEEDLE ASPIRATION BIOPSIES;  Surgeon: Josephine Igo, DO;  [facility];  Service: Cardiopulmonary;;   BRONCHIAL NEEDLE ASPIRATION BIOPSY  12/04/2022   Procedure: BRONCHIAL NEEDLE ASPIRATION BIOPSIES;  Surgeon: Josephine Igo, DO;  [facility];  Service: Pulmonary;;   BRONCHIAL WASHINGS  10/13/2019   Procedure: BRONCHIAL WASHINGS;  Surgeon: Josephine Igo, DO;  [facility];  Service: Pulmonary;;   BRONCHIAL WASHINGS  12/04/2022   Procedure: BRONCHIAL WASHINGS;  Surgeon: Josephine Igo, DO;  [facility];  Service: Pulmonary;;   COLONOSCOPY     multiple   DIVERTING ILEOSTOMY N/A 12/14/2021   Procedure: DIVERTING ILEOSTOMY;  Surgeon: Karie Soda, MD;  [facility]: [facility];  Service: General;  Laterality: N/A;   ENDOBRONCHIAL ULTRASOUND Bilateral 04/11/2022   Procedure: ENDOBRONCHIAL ULTRASOUND;  Surgeon: Josephine Igo, DO;  [facility];  Service: Cardiopulmonary;  Laterality: Bilateral;   ESOPHAGEAL DILATION  06/18/2022   Procedure: ESOPHAGEAL DILATION;  Surgeon: Shellia Cleverly, DO;  [facility];  Service: Gastroenterology;;   ESOPHAGOGASTRODUODENOSCOPY (EGD) WITH PROPOFOL N/A 06/18/2022   Procedure: ESOPHAGOGASTRODUODENOSCOPY (EGD) WITH PROPOFOL;  Surgeon: Shellia Cleverly, DO;  [facility];  Service: Gastroenterology;  Laterality: N/A;  Severe dysphagia/odynophagia ?radiation esophagitis versus oral candidiasis   FIDUCIAL MARKER PLACEMENT  12/04/2022   Procedure: FIDUCIAL MARKER PLACEMENT;  Surgeon: Josephine Igo, DO;  [facility];  Service: Pulmonary;;   ILEOSTOMY CLOSURE N/A 03/22/2022   Procedure: OPEN TAKEDOWN OF LOOP ILEOSTOMY;  Surgeon: Karie Soda, MD;  [facility]: [facility];  Service: General;  Laterality: N/A;  GEN w/ERAS PATHWAY LOCAL   INTERCOSTAL NERVE BLOCK Left 12/16/2019   Procedure: INTERCOSTAL NERVE BLOCK;  Surgeon: Corliss Skains, MD;  [facility];  Service: Thoracic;  Laterality: Left;   IR FLUORO RM 30-60 MIN  07/02/2022   IR IMAGING GUIDED PORT INSERTION  05/21/2023   KNEE SURGERY  07/20/2008   bilat.   NASAL SEPTUM SURGERY  1995   NODE DISSECTION Left 12/16/2019   Procedure: NODE DISSECTION;  Surgeon: Corliss Skains, MD;  [facility];  Service: Thoracic;  Laterality: Left;   PEG PLACEMENT N/A 07/04/2022   Procedure: PERCUTANEOUS ENDOSCOPIC GASTROSTOMY (PEG) PLACEMENT;  Surgeon: Kenney Peacemaker, MD;  [facility];  Service: Gastroenterology;  Laterality: N/A;   RADIOACTIVE SEED GUIDED AXILLARY SENTINEL LYMPH NODE Left 03/20/2022   Procedure: RADIOACTIVE SEED GUIDED LEFT AXILLARY SENTINEL LYMPH NODE BIOPSY;  Surgeon: Sim Dryer, MD;  [facility];  Service: General;  Laterality: Left;   RECTAL EXAM UNDER ANESTHESIA N/A 03/22/2022   Procedure: ANORECTAL EXAMINATION UNDER ANESTHESIA;  Surgeon: Candyce Champagne, MD;  Location: [facility];  Service: General;  Laterality: N/A;   TONSILLECTOMY     TRANSURETHRAL RESECTION OF BLADDER TUMOR  2002   UPPER GASTROINTESTINAL ENDOSCOPY     VIDEO BRONCHOSCOPY  04/11/2022   Procedure: VIDEO BRONCHOSCOPY;  Surgeon: Prudy Brownie, DO;  [facility];  Service: Cardiopulmonary;;   VIDEO BRONCHOSCOPY WITH ENDOBRONCHIAL NAVIGATION N/A 10/13/2019   Procedure: VIDEO BRONCHOSCOPY WITH ENDOBRONCHIAL NAVIGATION;  Surgeon: Prudy Brownie, DO;  [facility];  Service: Pulmonary;  Laterality: N/A;   WISDOM TOOTH EXTRACTION     XI ROBOTIC ASSISTED LOWER ANTERIOR RESECTION N/A 12/14/2021   Procedure: XI ROBOTIC ASSISTED LOWER ANTERIOR ULTRA LOW RECTOSIGMOID RESECTION, COLOANAL HAND SEWN ANASTOMOSIS, AND BILATERAL TAP BLOCK;  Surgeon: Candyce Champagne, MD;  Location: [facility];  Service: General;  Laterality: N/A;    REVIEW OF SYSTEMS:  Constitutional: positive for fatigue Eyes: negative Ears, nose, mouth, throat, and face: negative Respiratory:  negative Cardiovascular: negative Gastrointestinal: positive for diarrhea Genitourinary:negative Integument/breast: negative Hematologic/lymphatic: negative Musculoskeletal:negative Neurological: negative Behavioral/Psych: negative Endocrine: negative Allergic/Immunologic: negative   PHYSICAL EXAMINATION: General appearance: alert, cooperative, fatigued, and no distress Head: Normocephalic, without obvious abnormality, atraumatic Neck: no adenopathy, no JVD, supple, symmetrical, trachea midline, and thyroid not enlarged, symmetric, no tenderness/mass/nodules Lymph nodes: Cervical, supraclavicular, and axillary nodes normal. Resp: clear to auscultation bilaterally Back: symmetric, no curvature. ROM normal. No CVA tenderness. Cardio: regular rate and rhythm, S1, S2 normal, no murmur, click, rub or gallop GI: soft, non-tender; bowel sounds normal; no masses,  no organomegaly Extremities: extremities normal, atraumatic, no cyanosis or edema Neurologic: Alert and oriented X 3, normal strength and tone. Normal symmetric reflexes. Normal coordination and gait  ECOG PERFORMANCE STATUS: 1 - Symptomatic but completely ambulatory  Blood pressure 133/84, pulse (!) 105, temperature 98 F (36.7 C), temperature source Temporal, resp. rate 17, height 5\' 11"  (1.803 m), weight 130 lb 11.2 oz (59.3 kg), SpO2 100%.  LABORATORY DATA: Lab Results  Component Value Date   WBC 6.5 07/30/2023   HGB 8.3 (L) 07/30/2023   HCT 26.7 (L) 07/30/2023   MCV 95.4 07/30/2023   PLT 310 07/30/2023      Chemistry      Component Value Date/Time   NA 139 07/30/2023 0942   K 3.6 07/30/2023 0942   CL 104 07/30/2023 0942   CO2 29 07/30/2023 0942   BUN 28 (H) 07/30/2023 0942   CREATININE 0.64 07/30/2023 0942      Component Value Date/Time   CALCIUM 9.4 07/30/2023 0942   ALKPHOS 116 07/30/2023 0942   AST 29 07/30/2023 0942   ALT 26 07/30/2023 0942   BILITOT 0.3 07/30/2023 0942       RADIOGRAPHIC  STUDIES: CT CHEST ABDOMEN PELVIS W CONTRAST Result Date: 07/27/2023 CLINICAL DATA:  Metastatic non-small cell lung cancer restaging * Tracking Code: BO * EXAM: CT CHEST, ABDOMEN, AND PELVIS WITH CONTRAST TECHNIQUE: Multidetector CT imaging of the chest, abdomen and pelvis was performed following the standard protocol during bolus administration of intravenous contrast. RADIATION DOSE REDUCTION: This exam was performed according to  the departmental dose-optimization program which includes automated exposure control, adjustment of the mA and/or kV according to patient size and/or use of iterative reconstruction technique. CONTRAST:  OMNIPAQUE IOHEXOL 300 MG/ML  SOLN COMPARISON:  04/30/2023 FINDINGS: CT CHEST FINDINGS Cardiovascular: Right chest port catheter. Severe aortic atherosclerosis. Normal heart size. Three-vessel coronary artery calcifications. No pericardial effusion. Mediastinum/Nodes: No enlarged mediastinal, hilar, or axillary lymph nodes. Thyroid gland, trachea, and esophagus demonstrate no significant findings. Lungs/Pleura: Similar postoperative/post radiation appearance of the chest status post left upper lobectomy. Very dense perihilar consolidation and fibrosis of the left lower lobe, with somewhat increased consolidation compared to prior examination, particularly superiorly (series 5, image 46). New clustered, irregular opacity in the central superior segment left lower lobe situated in the left pulmonary apex (series 5, image 30). Slightly increased volume of a small, loculated appearing left pleural effusion. Tiny pulmonary nodules throughout the lungs are slightly increased in size and number and are now innumerable, the great majority of nodules measuring 0.3 cm and smaller (e.g. Series 5, image 46). Several larger, more discrete nodules are not significantly changed, for example in the anterior right upper lobe measuring 1.0 cm (series 5, image 39) and in the peripheral right lower  lobe and elongated, irregular nodule distal to a marking clip measuring 1.7 x 0.8 cm in greatest axial extent (series 5, image 119). Mild underlying centrilobular emphysema. Musculoskeletal: No chest wall abnormality. No acute osseous findings. CT ABDOMEN PELVIS FINDINGS Hepatobiliary: No solid liver abnormality is seen. No gallstones, gallbladder wall thickening, or biliary dilatation. Pancreas: Unremarkable. No pancreatic ductal dilatation or surrounding inflammatory changes. Spleen: Normal in size without significant abnormality. Adrenals/Urinary Tract: Unchanged soft tissue attenuation left adrenal nodule measuring 1.4 x 1.4 cm (series 2, image 61). Normal right adrenal. Unchanged, mildly prominent left renal pelvis. No hydronephrosis. Small nonobstructive calculus of the midportion of the right kidney. Unchanged symmetric bilateral perinephric fat stranding. Bladder is unremarkable. Stomach/Bowel: Stomach is within normal limits. Appendix appears normal. No evidence of bowel wall thickening, distention, or inflammatory changes. Status post distal small bowel resection and reanastomosis in the right lower quadrant (series 2, image 99). Vascular/Lymphatic: Severe aortic atherosclerosis. No enlarged abdominal or pelvic lymph nodes. Reproductive: No mass or other abnormality. Other: No abdominal wall hernia or abnormality. No ascites. Musculoskeletal: No acute osseous findings. IMPRESSION: 1. Very dense perihilar consolidation and fibrosis of the left lower lobe, with somewhat increased consolidation compared to prior examination, particularly superiorly, which may reflect increasing radiation fibrosis although is suspicious for local recurrence. 2. New clustered, irregular opacity in the central superior segment left lower lobe situated in the left pulmonary apex, possibly reflecting radiation pneumonitis or alternately postobstructive airspace disease. 3. Slightly increased volume of a small, loculated appearing  left pleural effusion. 4. Tiny pulmonary nodules throughout the lungs are slightly increased in size and number and are now innumerable, consistent with numerous tiny metastases. Several larger, more discrete nodules identified on prior examinations are not significantly changed. 5. Unchanged soft tissue attenuation left adrenal nodule, nonspecific but statistically most likely an adenoma. Continued attention on follow-up. 6. Emphysema. 7. Coronary artery disease. Aortic Atherosclerosis (ICD10-I70.0) and Emphysema (ICD10-J43.9). Electronically Signed   By: Jearld Lesch M.D.   On: 07/27/2023 16:45     ASSESSMENT AND PLAN: This is a very pleasant 73 years old white male recently diagnosed with a stage IIb (T1 a, N1, M0) non-small cell lung cancer, adenocarcinoma in June 2021 status post left upper lobectomy with lymph node dissection on  December 16, 2019 under the care of Dr. Cliffton Asters.  The patient has no actionable mutations and PD-L1 expression was 1%. He was also diagnosed with pelvic GIST tumor. The patient completed a course of adjuvant systemic chemotherapy with cisplatin 75 mg/M2 and Alimta 500 mg/M2 every 3 weeks status post 4 cycles.  He tolerated his treatment well except for fatigue and occasional nausea. For the pelvic GIST tumor, the patient completed neoadjuvant treatment with imatinib 400 mg p.o. daily in April 2023.  Status post 24 months.   The patient underwent surgical resection of the remaining gastrointestinal stromal tumor under the care of Dr. Michaell Cowing on December 14, 2021 with very minimal residual disease.  He resumed his treatment with imatinib and tolerating it fairly well. Repeat CT scan of the chest for restaging of his lung cancer. There was increased density of soft tissue in the left hilar region concerning for possible developing lymphadenopathy or disease recurrence.  A PET scan was performed recently and that showed new hypermetabolic left axillary, mediastinal and left hilar  adenopathy compatible with recurrent metastatic lung cancer no hypermetabolic metastatic disease in the neck, abdomen, pelvis or skeleton. He underwent excisional biopsy of the left axillary lymph node that was not conclusive for malignancy. The patient was seen by Dr. Tonia Brooms and repeat video bronchoscopy with EBUS on April 11, 2022 and biopsy from the station 7 lymph node showed recurrent adenocarcinoma. I had a lengthy discussion with the patient today about his current condition and treatment options. I recommended for the patient a course of concurrent chemoradiation with weekly carboplatin for AUC of 2 and paclitaxel 45 Mg/M2 for 6-7 weeks.  Status post 3 cycles of chemotherapy but he completed the course of radiation.  The patient was admitted to the hospital on June 07, 2022 with significant sepsis and Klebsiella pneumonia bacteremia.  He continues to have persistent diarrhea despite discontinuation of imatinib and treatment with cholestyramine.  This is likely functional diarrhea after his surgical resection for the pelvic GIST tumor. Repeat PET scan showed no concerning findings for disease progression except for suspicious slowly growing adenocarcinoma in the right lower lobe but inflammatory changes in the right lung as well as suspicious reactive mediastinal lymphadenopathy.  The patient had repeat bronchoscopy by Dr. Tonia Brooms and the final pathology was consistent with recurrent non-small cell lung cancer. The patient started systemic chemotherapy with carboplatin for AUC of 5 and Alimta 500 Mg/M2 on 12/25/2022.  Status post 6 cycles.  He is currently on maintenance treatment with single agent Alimta every 3 weeks status post 4 cycles. He has been tolerating this treatment well with no concerning adverse effect except for the baseline fatigue.    Lung cancer with radiation fibrosis Recurrent non-small cell lung cancer, adenocarcinoma with positive mediastinal lymph node in December 2023  that was initially diagnosed as stage IIB (T1 a, N1, M0) non-small cell lung cancer, adenocarcinoma diagnosed in June 2021.  The patient also has evidence for recurrent disease in the mediastinal lymph node in January 2024 and in the right lower lobe in August 2024. Recent CT scan indicates no significant increase in cancer activity. Inflammation in the left lung is present but not significant. Radiation fibrosis is more likely than cancer recurrence. - Continue current treatment regimen with Alimta every 3 weeks. cycle #5 today.  Diarrhea He experiences intermittent diarrhea with approximately eight bowel movements today. The diarrhea is constant and sore, with no dietary triggers identified. The etiology remains unclear despite consistent dietary habits.  Mild anemia Mild anemia persists with no change in severity or new symptoms.  Weight loss He has lost approximately 1.5 pounds since the last visit. The cause or significance of the weight loss is not discussed.  Follow-up Scheduled for follow-up in three weeks. - Schedule follow-up appointment in three weeks.   The patient was advised to call immediately if he has any concerning symptoms in the interval. The patient voices understanding of current disease status and treatment options and is in agreement with the current care plan.  All questions were answered. The patient knows to call the clinic with any problems, questions or concerns. We can certainly see the patient much sooner if necessary. The total time spent in the appointment was 30 minutes.  Disclaimer: This note was dictated with voice recognition software. Similar sounding words can inadvertently be transcribed and may not be corrected upon review.

## 2023-08-05 DIAGNOSIS — M9903 Segmental and somatic dysfunction of lumbar region: Secondary | ICD-10-CM | POA: Diagnosis not present

## 2023-08-05 DIAGNOSIS — M5431 Sciatica, right side: Secondary | ICD-10-CM | POA: Diagnosis not present

## 2023-08-08 ENCOUNTER — Other Ambulatory Visit: Payer: Self-pay | Admitting: Internal Medicine

## 2023-08-08 ENCOUNTER — Other Ambulatory Visit: Payer: Self-pay | Admitting: Physician Assistant

## 2023-08-08 DIAGNOSIS — C3490 Malignant neoplasm of unspecified part of unspecified bronchus or lung: Secondary | ICD-10-CM

## 2023-08-08 NOTE — Telephone Encounter (Signed)
 Please advise Sir, thank you.

## 2023-08-12 ENCOUNTER — Ambulatory Visit: Admitting: Nutrition

## 2023-08-12 DIAGNOSIS — M5431 Sciatica, right side: Secondary | ICD-10-CM | POA: Diagnosis not present

## 2023-08-12 DIAGNOSIS — M9903 Segmental and somatic dysfunction of lumbar region: Secondary | ICD-10-CM | POA: Diagnosis not present

## 2023-08-12 NOTE — Progress Notes (Signed)
 Patient's wife called asking for assistance increasing patient's oral intake.  Patient is experiencing altered taste and is not eating well.  She has questions about a variety of products that she would like for me to review.  A friend recommended mighty shakes. These are provided to patients in the hospital so I am familiar with them and they seem to be well liked by patients. 4 oz provides 220 calories and 6 gm protein. There were several other products made with pea protein that she wanted to try. She is working to provide patient with more calories. She is considering products made with real food. Educated on strategies to help with taste alterations.

## 2023-08-17 NOTE — Progress Notes (Unsigned)
 [facility] OFFICE PROGRESS NOTE  [name], MD [address] [place] [address]  DIAGNOSIS: 1) recurrent non-small cell lung cancer, adenocarcinoma with positive mediastinal lymph node in December 2023 that was initially diagnosed as stage IIB (T1 a, N1, M0) non-small cell lung cancer, adenocarcinoma diagnosed in June 2021.  The patient also has evidence for recurrent disease in the mediastinal lymph node in January 2024 and in the right lower lobe in August 2024. 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   Biomarker Findings Tumor Mutational Burden - 20 Muts/Mb Microsatellite status - MS-Stable Genomic Findings For a complete list of the genes assayed, please refer to the Appendix. KEAP1 I42fs*17 DNMT3A E817* RB1 splice site 1960+5G>C TP53 R209* 8 Disease relevant genes with no reportable alterations: ALK, BRAF, EGFR, ERBB2, KRAS, MET, RET, ROS1   PDL1 Expression: 1 %  PRIOR THERAPY: 1) status post left upper lobectomy with lymph node dissection on December 16, 2019 under the care of Dr. Deloise Ferries.  The tumor size measured 0.9 cm but there was involvement of the level 10 L and 11 L.  2) Adjuvant systemic chemotherapy with cisplatin  75 mg/M2 and Alimta  500 mg/M2 every 3 weeks.  First dose February 04, 2020.  Status post 4 cycles. 3) Neoadjuvant treatment with imatinib  400 mg p.o. daily for the GIST tumor of the pelvic area.  First dose started July 18, 2020.  Status post 12 months of treatment. 4) status post robotic assisted very low anterior rectosigmoid resection with coloanal anastomosis, diverting loop ileostomy and wedge liver biopsy under the care of Dr. Hershell Lose on December 14, 2021 and it showed minimal residual gastrointestinal stromal tumor. 5) concurrent chemoradiation with weekly carboplatin  for AUC of 2 and paclitaxel  45 Mg/M2.  First dose May 14, 2022. 6) Imatinib  400 mg p.o. daily 7) Systemic chemotherapy with carboplatin  for AUC of 5  and Alimta  500 Mg/M2 every 3 weeks.  First dose 12/25/2022.  Status post 6 cycles.  CURRENT THERAPY: Maintenance treatment with single agent Alimta  500 Mg/M2, status post 5 cycles.   INTERVAL HISTORY: Jesse [name] 73 y.o. male returns to the clinic today for a follow-up visit. The patient was last seen by Dr. Marguerita Shih.  The patient is currently undergoing single agent chemotherapy with Alimta .  He tolerates the fairly well.  The patient has been having some ongoing struggles with loose stool and diarrhea.  He has frequent bowel movements with small quantity. He previously had Gastrostomy tube. He previously had ileostomy which was takedown. He previously had a feeding tube.  He struggles with nutrition concerns and weight loss. He has recently started a high-calorie nutritional supplement, described as a pudding-like product, which he began two days ago. He hopes this will aid in weight gain, as he has lost weight since his last visit. He sees a member and the nutritionist team. He sees GI again in June and it sounds like they are considering him again for ileostomy.   He experiences intermittent shortness of breath, particularly during exertion such as walking uphill or over long distances. He has a history of lung cancer and scarring from prior treatments, which may contribute to these respiratory symptoms. No cough, headaches, or vision changes are reported.  No nausea or vomiting following his last treatment and no abdominal pain associated with his diarrhea. He frequently feels cold, which may be related to his anemia, as his hemoglobin levels are consistently low, around 8.7 g/dL. He is currently taking iron supplements  daily and receives B12 injections every three treatments. He also takes folic acid  as part of his regimen. He denies bleeding. He notes  night sweats, with the most recent episode occurring last night. Overall, he states that he seldomly gets night sweats.    He is here today for  evaluation repeat blood work before undergoing cycle #6 of maintenance.    MEDICAL HISTORY: Past Medical History:  Diagnosis Date   Anemia    Cancer (HCC)    bladder cancer   COPD (chronic obstructive pulmonary disease) (HCC)    mild    DIVERTICULOSIS, COLON 01/14/2007   Qualifier: Diagnosis of  By: Rachell Budge RN, Arlena Belts    GASTRITIS, CHRONIC 06/01/2004   Qualifier: Diagnosis of  By: Tereasa Felty CMA (AAMA), Robin     GERD 01/09/2007   Qualifier: Diagnosis of  By: Ena Harries, RN, Ellen     Heart murmur    as a child; no murmur heard 12/14/19   History of radiation therapy    Mediastinum- 05/15/22-06/28/22-Dr. Retta Caster   HYPERLIPIDEMIA 01/09/2007   Qualifier: Diagnosis of  By: Georgie Kiss     Hypertension    Lung mass    left upper nodule   Macular degeneration    right eye   NEOPLASM, MALIGNANT, BLADDER, HX OF 2002   Qualifier: Diagnosis of  By: Tereasa Felty CMA (AAMA), Robin  / no chemo or radiation   OSTEOARTHRITIS 01/14/2007   Qualifier: Diagnosis of  By: Rachell Budge, RN, Arlena Belts - knees   Rectal mass    Reflux esophagitis 06/01/2004   Qualifier: Diagnosis of  By: Tereasa Felty CMA Nonie Beady), Robin     Stroke [facility])    2024   TOBACCO USER 02/16/2009   Qualifier: Diagnosis of  By: Minnette Amato  MD, Ronie Cohen    VITAMIN D  DEFICIENCY 06/03/2007   Qualifier: Diagnosis of  By: Minnette Amato  MD, Ronie Cohen     ALLERGIES:  has no known allergies.  MEDICATIONS:  Current Outpatient Medications  Medication Sig Dispense Refill   acetaminophen  (TYLENOL ) 500 MG tablet Take 1,000 mg by mouth every 8 (eight) hours as needed for moderate pain.     aspirin  EC 81 MG tablet Take 81 mg by mouth daily. Swallow whole.     atorvastatin  (LIPITOR) 20 MG tablet Take 1 tablet (20 mg total) by mouth daily. 90 tablet 3   Cholecalciferol  (VITAMIN D3 PO) Take 2,000 Units by mouth daily.     dexamethasone  (DECADRON ) 4 MG tablet PLEASE TAKE 1 TABLET TWICE A DAY THE DAY BEFORE, THE DAY OF, AND THE DAY AFTER CHEMOTHERAPY  60 tablet 0   diphenoxylate -atropine  (LOMOTIL ) 2.5-0.025 MG tablet Take 2 tablets by mouth 3 (three) times daily as needed for diarrhea or loose stools. 90 tablet 0   ferrous sulfate  325 (65 FE) MG EC tablet Take 1 tablet (325 mg total) by mouth 2 (two) times daily. 60 tablet 1   folic acid  (FOLVITE ) 1 MG tablet TAKE 1 TABLET BY MOUTH EVERY DAY 90 tablet 1   gabapentin  (NEURONTIN ) 100 MG capsule Take 1 capsule (100 mg total) by mouth at bedtime. 90 capsule 3   lidocaine  (XYLOCAINE ) 5 % ointment Apply 1 Application topically 3 (three) times daily as needed for mild pain or moderate pain. 35.44 g 3   lidocaine -prilocaine  (EMLA ) cream Apply 1 Application topically as needed. 30 g 2   loperamide  (IMODIUM  A-D) 2 MG tablet Take 4 mg by mouth 4 (four) times daily as needed for diarrhea or loose  stools.     mirtazapine  (REMERON  SOL-TAB) 30 MG disintegrating tablet TAKE 1 TABLET BY MOUTH EVERYDAY AT BEDTIME 90 tablet 1   Multiple Vitamins-Minerals (PRESERVISION AREDS 2) CAPS Take 1 capsule by mouth 2 (two) times daily.     OVER THE COUNTER MEDICATION Take 1 tablet by mouth daily. PROBIOTICS     OVER THE COUNTER MEDICATION Fiber and spice pt take every morning     OVER THE COUNTER MEDICATION Juce pt takes every morning     pantoprazole  (PROTONIX ) 40 MG tablet Take 1 tablet (40 mg total) by mouth 2 (two) times daily. 60 tablet 6   prochlorperazine  (COMPAZINE ) 10 MG tablet Take 1 tablet (10 mg total) by mouth every 6 (six) hours as needed for nausea or vomiting. 30 tablet 0   No current facility-administered medications for this visit.    SURGICAL HISTORY:  Past Surgical History:  Procedure Laterality Date   BIOPSY  06/18/2022   Procedure: BIOPSY;  Surgeon: Annis Kinder, DO;  Location: [facility];  Service: Gastroenterology;;   BREAST BIOPSY Left 03/16/2022   US  LT RADIOACTIVE SEED LOC 03/16/2022 [facility]   BRONCHIAL BIOPSY  10/13/2019   Procedure: BRONCHIAL BIOPSIES;  Surgeon: Prudy Brownie, DO;  Location: [facility];  Service: Pulmonary;;   BRONCHIAL BIOPSY  12/04/2022   Procedure: BRONCHIAL BIOPSIES;  Surgeon: Prudy Brownie, DO;  [facility]: [facility];  Service: Pulmonary;;   BRONCHIAL BRUSHINGS  10/13/2019   Procedure: BRONCHIAL BRUSHINGS;  Surgeon: Prudy Brownie, DO;  [facility];  Service: Pulmonary;;   BRONCHIAL NEEDLE ASPIRATION BIOPSY  10/13/2019   Procedure: BRONCHIAL NEEDLE ASPIRATION BIOPSIES;  Surgeon: Prudy Brownie, DO;  [facility];  Service: Pulmonary;;   BRONCHIAL NEEDLE ASPIRATION BIOPSY  04/11/2022   Procedure: BRONCHIAL NEEDLE ASPIRATION BIOPSIES;  Surgeon: Prudy Brownie, DO;  [facility]: [facility];  Service: Cardiopulmonary;;   BRONCHIAL NEEDLE ASPIRATION BIOPSY  12/04/2022   Procedure: BRONCHIAL NEEDLE ASPIRATION BIOPSIES;  Surgeon: Prudy Brownie, DO;  [facility];  Service: Pulmonary;;   BRONCHIAL WASHINGS  10/13/2019   Procedure: BRONCHIAL WASHINGS;  Surgeon: Prudy Brownie, DO;  [facility];  Service: Pulmonary;;   BRONCHIAL WASHINGS  12/04/2022   Procedure: BRONCHIAL WASHINGS;  Surgeon: Prudy Brownie, DO;  [facility];  Service: Pulmonary;;   COLONOSCOPY     multiple   DIVERTING ILEOSTOMY N/A 12/14/2021   Procedure: DIVERTING ILEOSTOMY;  Surgeon: Candyce Champagne, MD;  Location: [facility];  Service: General;  Laterality: N/A;   ENDOBRONCHIAL ULTRASOUND Bilateral 04/11/2022   Procedure: ENDOBRONCHIAL ULTRASOUND;  Surgeon: Prudy Brownie, DO;  [facility]: [facility];  Service: Cardiopulmonary;  Laterality: Bilateral;   ESOPHAGEAL DILATION  06/18/2022   Procedure: ESOPHAGEAL DILATION;  Surgeon: Annis Kinder, DO;  [facility]: [facility];  Service: Gastroenterology;;   ESOPHAGOGASTRODUODENOSCOPY (EGD) WITH PROPOFOL  N/A 06/18/2022   Procedure: ESOPHAGOGASTRODUODENOSCOPY (EGD) WITH PROPOFOL ;  Surgeon: Annis Kinder, DO;  Location: [facility];  Service: Gastroenterology;   Laterality: N/A;  Severe dysphagia/odynophagia ?radiation esophagitis versus oral candidiasis   FIDUCIAL MARKER PLACEMENT  12/04/2022   Procedure: FIDUCIAL MARKER PLACEMENT;  Surgeon: Prudy Brownie, DO;  [facility];  Service: Pulmonary;;   ILEOSTOMY CLOSURE N/A 03/22/2022   Procedure: OPEN TAKEDOWN OF LOOP ILEOSTOMY;  Surgeon: Candyce Champagne, MD;  Location: [facility];  Service: General;  Laterality: N/A;  GEN w/ERAS PATHWAY LOCAL   INTERCOSTAL NERVE BLOCK Left 12/16/2019   Procedure: INTERCOSTAL NERVE BLOCK;  Surgeon:  Hilarie Lovely, MD;  Location: [facility];  Service: Thoracic;  Laterality: Left;   IR FLUORO RM 30-60 MIN  07/02/2022   IR IMAGING GUIDED PORT INSERTION  05/21/2023   KNEE SURGERY  07/20/2008   bilat.   NASAL SEPTUM SURGERY  1995   NODE DISSECTION Left 12/16/2019   Procedure: NODE DISSECTION;  Surgeon: Hilarie Lovely, MD;  Location: [facility];  Service: Thoracic;  Laterality: Left;   PEG PLACEMENT N/A 07/04/2022   Procedure: PERCUTANEOUS ENDOSCOPIC GASTROSTOMY (PEG) PLACEMENT;  Surgeon: Kenney Peacemaker, MD;  Location: [facility];  Service: Gastroenterology;  Laterality: N/A;   RADIOACTIVE SEED GUIDED AXILLARY SENTINEL LYMPH NODE Left 03/20/2022   Procedure: RADIOACTIVE SEED GUIDED LEFT AXILLARY SENTINEL LYMPH NODE BIOPSY;  Surgeon: Sim Dryer, MD;  [facility];  Service: General;  Laterality: Left;   RECTAL EXAM UNDER ANESTHESIA N/A 03/22/2022   Procedure: ANORECTAL EXAMINATION UNDER ANESTHESIA;  Surgeon: Candyce Champagne, MD;  Location: [facility];  Service: General;  Laterality: N/A;   TONSILLECTOMY     TRANSURETHRAL RESECTION OF BLADDER TUMOR  2002   UPPER GASTROINTESTINAL ENDOSCOPY     VIDEO BRONCHOSCOPY  04/11/2022   Procedure: VIDEO BRONCHOSCOPY;  Surgeon: Prudy Brownie, DO;  [facility]: [facility];  Service: Cardiopulmonary;;   VIDEO BRONCHOSCOPY WITH ENDOBRONCHIAL NAVIGATION N/A 10/13/2019   Procedure: VIDEO BRONCHOSCOPY WITH ENDOBRONCHIAL NAVIGATION;  Surgeon:  Prudy Brownie, DO;  [facility];  Service: Pulmonary;  Laterality: N/A;   WISDOM TOOTH EXTRACTION     XI ROBOTIC ASSISTED LOWER ANTERIOR RESECTION N/A 12/14/2021   Procedure: XI ROBOTIC ASSISTED LOWER ANTERIOR ULTRA LOW RECTOSIGMOID RESECTION, COLOANAL HAND SEWN ANASTOMOSIS, AND BILATERAL TAP BLOCK;  Surgeon: Candyce Champagne, MD;  Location: [facility];  Service: General;  Laterality: N/A;    REVIEW OF SYSTEMS:   Constitutional: Positive for stable fatigue and weight loss  Negative for appetite change, chills, and fever.  HENT:  Negative for mouth sores, nosebleeds, sore throat and trouble swallowing.   Eyes: Negative for eye problems and icterus.  Respiratory: Positive for dyspnea on exertion.  Negative for cough, hemoptysis, and wheezing.   Cardiovascular: Negative for chest pain and leg swelling.  Gastrointestinal: Positive for irregular bowel movements/diarrhea. Negative for nausea and vomiting.  Genitourinary: Negative for bladder incontinence, difficulty urinating, dysuria, frequency and hematuria.   Musculoskeletal: Negative for back pain, gait problem, neck pain and neck stiffness.  Skin: Negative for itching and rash.  Neurological: Negative for dizziness, extremity weakness, gait problem, headaches, light-headedness and seizures.  Hematological: Negative for adenopathy. Does not bruise/bleed easily.  Psychiatric/Behavioral: Negative for confusion, depression and sleep disturbance. The patient is not nervous/anxious.     PHYSICAL EXAMINATION:  There were no vitals taken for this visit.  ECOG PERFORMANCE STATUS: 1  Physical Exam  Constitutional: Oriented to person, place, and time and cachetic appearing male and in no distress.  HENT:  Head: Normocephalic and atraumatic.  Mouth/Throat: Oropharynx is clear and moist. No oropharyngeal exudate.  Eyes: Conjunctivae are normal. Right eye exhibits no discharge. Left eye exhibits no discharge. No scleral icterus.  Neck: Normal  range of motion. Neck supple.  Cardiovascular: Normal rate, regular rhythm, normal heart sounds and intact distal pulses.   Pulmonary/Chest: Effort normal and breath sounds normal. No respiratory distress. No wheezes. No rales.  Abdominal: Soft. Bowel sounds are normal. Exhibits no distension and no mass. There is no tenderness.  Musculoskeletal: Normal range of motion. Exhibits no edema.  Lymphadenopathy:    No cervical  adenopathy.  Neurological: Alert and oriented to person, place, and time. Exhibits muscle wasting. Gait normal. Coordination normal.  Skin: Skin is warm and dry. No rash noted. Not diaphoretic. No erythema. No pallor.  Psychiatric: Mood, memory and judgment normal.  Vitals reviewed.  LABORATORY DATA: Lab Results  Component Value Date   WBC 6.5 07/30/2023   HGB 8.3 (L) 07/30/2023   HCT 26.7 (L) 07/30/2023   MCV 95.4 07/30/2023   PLT 310 07/30/2023      Chemistry      Component Value Date/Time   NA 139 07/30/2023 0942   K 3.6 07/30/2023 0942   CL 104 07/30/2023 0942   CO2 29 07/30/2023 0942   BUN 28 (H) 07/30/2023 0942   CREATININE 0.64 07/30/2023 0942      Component Value Date/Time   CALCIUM  9.4 07/30/2023 0942   ALKPHOS 116 07/30/2023 0942   AST 29 07/30/2023 0942   ALT 26 07/30/2023 0942   BILITOT 0.3 07/30/2023 0942       RADIOGRAPHIC STUDIES:  CT CHEST ABDOMEN PELVIS W CONTRAST Result Date: 07/27/2023 CLINICAL DATA:  Metastatic non-small cell lung cancer restaging * Tracking Code: BO * EXAM: CT CHEST, ABDOMEN, AND PELVIS WITH CONTRAST TECHNIQUE: Multidetector CT imaging of the chest, abdomen and pelvis was performed following the standard protocol during bolus administration of intravenous contrast. RADIATION DOSE REDUCTION: This exam was performed according to the departmental dose-optimization program which includes automated exposure control, adjustment of the mA and/or kV according to patient size and/or use of iterative reconstruction technique.  CONTRAST:  OMNIPAQUE  IOHEXOL  300 MG/ML  SOLN COMPARISON:  04/30/2023 FINDINGS: CT CHEST FINDINGS Cardiovascular: Right chest port catheter. Severe aortic atherosclerosis. Normal heart size. Three-vessel coronary artery calcifications. No pericardial effusion. Mediastinum/Nodes: No enlarged mediastinal, hilar, or axillary lymph nodes. Thyroid  gland, trachea, and esophagus demonstrate no significant findings. Lungs/Pleura: Similar postoperative/post radiation appearance of the chest status post left upper lobectomy. Very dense perihilar consolidation and fibrosis of the left lower lobe, with somewhat increased consolidation compared to prior examination, particularly superiorly (series 5, image 46). New clustered, irregular opacity in the central superior segment left lower lobe situated in the left pulmonary apex (series 5, image 30). Slightly increased volume of a small, loculated appearing left pleural effusion. Tiny pulmonary nodules throughout the lungs are slightly increased in size and number and are now innumerable, the great majority of nodules measuring 0.3 cm and smaller (e.g. Series 5, image 46). Several larger, more discrete nodules are not significantly changed, for example in the anterior right upper lobe measuring 1.0 cm (series 5, image 39) and in the peripheral right lower lobe and elongated, irregular nodule distal to a marking clip measuring 1.7 x 0.8 cm in greatest axial extent (series 5, image 119). Mild underlying centrilobular emphysema. Musculoskeletal: No chest wall abnormality. No acute osseous findings. CT ABDOMEN PELVIS FINDINGS Hepatobiliary: No solid liver abnormality is seen. No gallstones, gallbladder wall thickening, or biliary dilatation. Pancreas: Unremarkable. No pancreatic ductal dilatation or surrounding inflammatory changes. Spleen: Normal in size without significant abnormality. Adrenals/Urinary Tract: Unchanged soft tissue attenuation left adrenal nodule measuring 1.4 x  1.4 cm (series 2, image 61). Normal right adrenal. Unchanged, mildly prominent left renal pelvis. No hydronephrosis. Small nonobstructive calculus of the midportion of the right kidney. Unchanged symmetric bilateral perinephric fat stranding. Bladder is unremarkable. Stomach/Bowel: Stomach is within normal limits. Appendix appears normal. No evidence of bowel wall thickening, distention, or inflammatory changes. Status post distal small bowel resection and reanastomosis in  the right lower quadrant (series 2, image 99). Vascular/Lymphatic: Severe aortic atherosclerosis. No enlarged abdominal or pelvic lymph nodes. Reproductive: No mass or other abnormality. Other: No abdominal wall hernia or abnormality. No ascites. Musculoskeletal: No acute osseous findings. IMPRESSION: 1. Very dense perihilar consolidation and fibrosis of the left lower lobe, with somewhat increased consolidation compared to prior examination, particularly superiorly, which may reflect increasing radiation fibrosis although is suspicious for local recurrence. 2. New clustered, irregular opacity in the central superior segment left lower lobe situated in the left pulmonary apex, possibly reflecting radiation pneumonitis or alternately postobstructive airspace disease. 3. Slightly increased volume of a small, loculated appearing left pleural effusion. 4. Tiny pulmonary nodules throughout the lungs are slightly increased in size and number and are now innumerable, consistent with numerous tiny metastases. Several larger, more discrete nodules identified on prior examinations are not significantly changed. 5. Unchanged soft tissue attenuation left adrenal nodule, nonspecific but statistically most likely an adenoma. Continued attention on follow-up. 6. Emphysema. 7. Coronary artery disease. Aortic Atherosclerosis (ICD10-I70.0) and Emphysema (ICD10-J43.9). Electronically Signed   By: Fredricka Jenny M.D.   On: 07/27/2023 16:45     ASSESSMENT/PLAN:   This is a very pleasant 73 year old Caucasian male diagnosed with recurrent disease but he was initially diagnosed as stage IIIb (T1a, N1, M0) non-small cell lung cancer, adenocarcinoma.  He was initially diagnosed in June 2021 and is status post left upper lobectomy with lymph node dissection on 12/16/2019 to the care of Dr. Deloise Ferries.  The patient has no actionable mutations and his PD-L1 expression is 1%.    For his lung cancer, the patient initially completed adjuvant systemic chemotherapy with cisplatin  75 mg/m and Alimta  500 mg/m IV every 3 weeks status post 4 cycles.    For the pelvic GIST tumor, the patient completed neoadjuvant treatment with imatinib  400 mg p.o. daily in April 2023.  He patient underwent surgical resection of the remaining gastrointestinal stromal tumor under the care of Dr. Hershell Lose on December 14, 2021 with very minimal residual disease.     In November 2023, his restaging CT scan for his lung cancer showed increased soft tissue density in the left hilar region concerning for possible developing lymphadenopathy or disease recurrence.  A PET scan was performed and showed new hypermetabolic left axillary, mediastinal, and left hilar adenopathy compatible with disease recurrence.   He underwent excisional biopsy of left axillary lymph node which was not conclusive of malignancy.   Therefore Dr. Thelda Finney performed a repeat video bronchoscopy with EBUS on 04/11/2022 and the biopsy from the station 7 lymph node showed recurrent adenocarcinoma.   Therefore the patient started concurrent chemoradiation with carboplatin  for an AUC of 2 and paclitaxel  45 mg/m. His first dose was on 05/14/2022. The patient was admitted to the hospital on June 07, 2022 with significant sepsis and Klebsiella pneumonia bacteremia.   He continues to have persistent diarrhea despite discontinuation of imatinib  and treatment with cholestyramine . This is likely functional diarrhea after his surgical resection  for the pelvic GIST tumor.   Repeat PET scan showed no concerning findings for disease progression except for suspicious slowly growing adenocarcinoma in the right lower lobe but inflammatory changes in the right lung as well as suspicious reactive mediastinal lymphadenopathy.  The patient had repeat bronchoscopy by Dr. Thelda Finney and the final pathology was consistent with recurrent non-small cell lung cancer. The patient started systemic chemotherapy with carboplatin  for AUC of 5 and Alimta  500 Mg/M2 on 12/25/2022.  Status post 6 cycles.  He is currently on maintenance treatment with single agent Alimta  every 3 weeks status post 5 cycles.   Labs were reviewed.  Recommend that he proceed with cycle #6 today as scheduled.  Will see him back for follow-up visit in 3 weeks for evaluation and repeat blood work before undergoing cycle #7.  Anemia Chronic anemia with hemoglobin at 8.7, contributing to fatigue and cold intolerance. No bleeding or vitamin deficiencies reported. - Check vitamin levels at next appointment. - Add iron studies to next lab work. - Check B12 and folate levels.  Chronic diarrhea Chronic diarrhea with frequent bowel movements contributing to weight loss and potential nutritional deficiencies. Surgical intervention under consideration. - Continue discussions regarding potential surgical intervention with GI - Monitor weight and nutritional intake. He will try the new protein supplements recommended  The patient was advised to call immediately if he has any concerning symptoms in the interval. The patient voices understanding of current disease status and treatment options and is in agreement with the current care plan. All questions were answered. The patient knows to call the clinic with any problems, questions or concerns. We can certainly see the patient much sooner if necessary         No orders of the defined types were placed in this encounter.    The total time  spent in the appointment was 20-29 minutes  Jesse Malone L Tiyanna Larcom, PA-C 08/17/23

## 2023-08-19 ENCOUNTER — Other Ambulatory Visit: Payer: Self-pay | Admitting: Physician Assistant

## 2023-08-19 ENCOUNTER — Telehealth: Payer: Self-pay | Admitting: Internal Medicine

## 2023-08-19 DIAGNOSIS — C49A5 Gastrointestinal stromal tumor of rectum: Secondary | ICD-10-CM

## 2023-08-19 DIAGNOSIS — C3492 Malignant neoplasm of unspecified part of left bronchus or lung: Secondary | ICD-10-CM

## 2023-08-19 DIAGNOSIS — D6481 Anemia due to antineoplastic chemotherapy: Secondary | ICD-10-CM

## 2023-08-19 DIAGNOSIS — R197 Diarrhea, unspecified: Secondary | ICD-10-CM

## 2023-08-19 DIAGNOSIS — Z515 Encounter for palliative care: Secondary | ICD-10-CM

## 2023-08-19 NOTE — Telephone Encounter (Signed)
 Patient requesting to speak with a nurse in regards to being scheduled with provider.   Advised patient provider currently has nothing available.   Please advise. Thank you

## 2023-08-20 ENCOUNTER — Inpatient Hospital Stay

## 2023-08-20 ENCOUNTER — Inpatient Hospital Stay: Attending: Internal Medicine

## 2023-08-20 ENCOUNTER — Encounter: Payer: Self-pay | Admitting: Nurse Practitioner

## 2023-08-20 ENCOUNTER — Inpatient Hospital Stay (HOSPITAL_BASED_OUTPATIENT_CLINIC_OR_DEPARTMENT_OTHER): Admitting: Physician Assistant

## 2023-08-20 ENCOUNTER — Inpatient Hospital Stay (HOSPITAL_BASED_OUTPATIENT_CLINIC_OR_DEPARTMENT_OTHER): Admitting: Nurse Practitioner

## 2023-08-20 VITALS — HR 95

## 2023-08-20 VITALS — BP 128/77 | HR 105 | Temp 97.6°F | Resp 18 | Wt 125.7 lb

## 2023-08-20 DIAGNOSIS — R530 Neoplastic (malignant) related fatigue: Secondary | ICD-10-CM | POA: Diagnosis not present

## 2023-08-20 DIAGNOSIS — Z9221 Personal history of antineoplastic chemotherapy: Secondary | ICD-10-CM | POA: Insufficient documentation

## 2023-08-20 DIAGNOSIS — Z79899 Other long term (current) drug therapy: Secondary | ICD-10-CM | POA: Insufficient documentation

## 2023-08-20 DIAGNOSIS — C771 Secondary and unspecified malignant neoplasm of intrathoracic lymph nodes: Secondary | ICD-10-CM | POA: Insufficient documentation

## 2023-08-20 DIAGNOSIS — Z515 Encounter for palliative care: Secondary | ICD-10-CM | POA: Diagnosis not present

## 2023-08-20 DIAGNOSIS — R634 Abnormal weight loss: Secondary | ICD-10-CM | POA: Diagnosis not present

## 2023-08-20 DIAGNOSIS — D649 Anemia, unspecified: Secondary | ICD-10-CM

## 2023-08-20 DIAGNOSIS — C3412 Malignant neoplasm of upper lobe, left bronchus or lung: Secondary | ICD-10-CM | POA: Diagnosis not present

## 2023-08-20 DIAGNOSIS — Z7982 Long term (current) use of aspirin: Secondary | ICD-10-CM | POA: Diagnosis not present

## 2023-08-20 DIAGNOSIS — C3431 Malignant neoplasm of lower lobe, right bronchus or lung: Secondary | ICD-10-CM

## 2023-08-20 DIAGNOSIS — C49A9 Gastrointestinal stromal tumor of other sites: Secondary | ICD-10-CM | POA: Insufficient documentation

## 2023-08-20 DIAGNOSIS — T451X5A Adverse effect of antineoplastic and immunosuppressive drugs, initial encounter: Secondary | ICD-10-CM

## 2023-08-20 DIAGNOSIS — C49A5 Gastrointestinal stromal tumor of rectum: Secondary | ICD-10-CM

## 2023-08-20 DIAGNOSIS — Z5111 Encounter for antineoplastic chemotherapy: Secondary | ICD-10-CM | POA: Insufficient documentation

## 2023-08-20 DIAGNOSIS — Z95828 Presence of other vascular implants and grafts: Secondary | ICD-10-CM

## 2023-08-20 DIAGNOSIS — Z902 Acquired absence of lung [part of]: Secondary | ICD-10-CM | POA: Diagnosis not present

## 2023-08-20 DIAGNOSIS — Z923 Personal history of irradiation: Secondary | ICD-10-CM | POA: Diagnosis not present

## 2023-08-20 DIAGNOSIS — C3492 Malignant neoplasm of unspecified part of left bronchus or lung: Secondary | ICD-10-CM

## 2023-08-20 LAB — CMP (CANCER CENTER ONLY)
ALT: 44 U/L (ref 0–44)
AST: 38 U/L (ref 15–41)
Albumin: 3.3 g/dL — ABNORMAL LOW (ref 3.5–5.0)
Alkaline Phosphatase: 126 U/L (ref 38–126)
Anion gap: 5 (ref 5–15)
BUN: 27 mg/dL — ABNORMAL HIGH (ref 8–23)
CO2: 29 mmol/L (ref 22–32)
Calcium: 9.3 mg/dL (ref 8.9–10.3)
Chloride: 104 mmol/L (ref 98–111)
Creatinine: 0.58 mg/dL — ABNORMAL LOW (ref 0.61–1.24)
GFR, Estimated: >60 mL/min (ref >60)
Glucose, Bld: 126 mg/dL — ABNORMAL HIGH (ref 70–99)
Potassium: 3.6 mmol/L (ref 3.5–5.1)
Sodium: 138 mmol/L (ref 135–145)
Total Bilirubin: 0.3 mg/dL (ref 0.0–1.2)
Total Protein: 7.6 g/dL (ref 6.5–8.1)

## 2023-08-20 LAB — CBC WITH DIFFERENTIAL (CANCER CENTER ONLY)
Abs Immature Granulocytes: 0.02 10*3/uL (ref 0.00–0.07)
Basophils Absolute: 0 10*3/uL (ref 0.0–0.1)
Basophils Relative: 0 %
Eosinophils Absolute: 0.1 10*3/uL (ref 0.0–0.5)
Eosinophils Relative: 2 %
HCT: 27.5 % — ABNORMAL LOW (ref 39.0–52.0)
Hemoglobin: 8.7 g/dL — ABNORMAL LOW (ref 13.0–17.0)
Immature Granulocytes: 0 %
Lymphocytes Relative: 6 %
Lymphs Abs: 0.4 10*3/uL — ABNORMAL LOW (ref 0.7–4.0)
MCH: 29.3 pg (ref 26.0–34.0)
MCHC: 31.6 g/dL (ref 30.0–36.0)
MCV: 92.6 fL (ref 80.0–100.0)
Monocytes Absolute: 0.4 10*3/uL (ref 0.1–1.0)
Monocytes Relative: 6 %
Neutro Abs: 6.2 10*3/uL (ref 1.7–7.7)
Neutrophils Relative %: 86 %
Platelet Count: 284 10*3/uL (ref 150–400)
RBC: 2.97 MIL/uL — ABNORMAL LOW (ref 4.22–5.81)
RDW: 18 % — ABNORMAL HIGH (ref 11.5–15.5)
WBC Count: 7.2 10*3/uL (ref 4.0–10.5)
nRBC: 0 % (ref 0.0–0.2)

## 2023-08-20 LAB — SAMPLE TO BLOOD BANK

## 2023-08-20 MED ORDER — PROCHLORPERAZINE MALEATE 10 MG PO TABS
10.0000 mg | ORAL_TABLET | Freq: Once | ORAL | Status: AC
  Administered 2023-08-20: 10 mg via ORAL
  Filled 2023-08-20: qty 1

## 2023-08-20 MED ORDER — SODIUM CHLORIDE 0.9 % IV SOLN: INTRAVENOUS | Status: DC

## 2023-08-20 MED ORDER — SODIUM CHLORIDE 0.9 % IV SOLN
500.0000 mg/m2 | Freq: Once | INTRAVENOUS | Status: AC
  Administered 2023-08-20: 900 mg via INTRAVENOUS
  Filled 2023-08-20: qty 20

## 2023-08-20 MED ORDER — SODIUM CHLORIDE 0.9% FLUSH
10.0000 mL | Freq: Once | INTRAVENOUS | Status: AC
Start: 2023-08-20 — End: 2023-08-20
  Administered 2023-08-20: 10 mL

## 2023-08-20 MED ORDER — CYANOCOBALAMIN 1000 MCG/ML IJ SOLN
1000.0000 ug | Freq: Once | INTRAMUSCULAR | Status: AC
  Administered 2023-08-20: 1000 ug via INTRAMUSCULAR
  Filled 2023-08-20: qty 1

## 2023-08-20 NOTE — Patient Instructions (Signed)
 [facility] - A DEPT OF [facility]  Discharge Instructions: Thank you for choosing [facility] to provide your oncology and hematology care.   If you have a lab appointment with the Cancer Center, please go directly to the Cancer Center and check in at the registration area.   Wear comfortable clothing and clothing appropriate for easy access to any Portacath or PICC line.   We strive to give you quality time with your provider. You may need to reschedule your appointment if you arrive late (15 or more minutes).  Arriving late affects you and other patients whose appointments are after yours.  Also, if you miss three or more appointments without notifying the office, you may be dismissed from the clinic at the provider's discretion.      For prescription refill requests, have your pharmacy contact our office and allow 72 hours for refills to be completed.    Today you received the following chemotherapy and/or immunotherapy agents alimta      To help prevent nausea and vomiting after your treatment, we encourage you to take your nausea medication as directed.  BELOW ARE SYMPTOMS THAT SHOULD BE REPORTED IMMEDIATELY: *FEVER GREATER THAN 100.4 F (38 C) OR HIGHER *CHILLS OR SWEATING *NAUSEA AND VOMITING THAT IS NOT CONTROLLED WITH YOUR NAUSEA MEDICATION *UNUSUAL SHORTNESS OF BREATH *UNUSUAL BRUISING OR BLEEDING *URINARY PROBLEMS (pain or burning when urinating, or frequent urination) *BOWEL PROBLEMS (unusual diarrhea, constipation, pain near the anus) TENDERNESS IN MOUTH AND THROAT WITH OR WITHOUT PRESENCE OF ULCERS (sore throat, sores in mouth, or a toothache) UNUSUAL RASH, SWELLING OR PAIN  UNUSUAL VAGINAL DISCHARGE OR ITCHING   Items with * indicate a potential emergency and should be followed up as soon as possible or go to the Emergency Department if any problems should occur.  Please show the CHEMOTHERAPY ALERT CARD or IMMUNOTHERAPY  ALERT CARD at check-in to the Emergency Department and triage nurse.  Should you have questions after your visit or need to cancel or reschedule your appointment, please contact [facility] - A DEPT OF [facility]  Dept: [phone]  and follow the prompts.  Office hours are 8:00 a.m. to 4:30 p.m. Monday - Friday. Please note that voicemails left after 4:00 p.m. may not be returned until the following business day.  We are closed weekends and major holidays. You have access to a nurse at all times for urgent questions. Please call the main number to the clinic Dept: [phone] and follow the prompts.   For any non-urgent questions, you may also contact your provider using MyChart. We now offer e-Visits for anyone 50 and older to request care online for non-urgent symptoms. For details visit [email or web address].   Also download the MyChart app! Go to the app store, search "MyChart", open the app, select [facility], and log in with your MyChart username and password.

## 2023-08-20 NOTE — Progress Notes (Signed)
 Palliative Medicine [facility]  [phone] [phone]) [phone]   Name: Jesse [name] Date: 08/20/2023 MRN: [id number]  DOB: [birth year]  Patient Care Team: [name], MD as PCP - General (Family Medicine) Marlene Simas, MD as Consulting Physician (Oncology) Alver Austin, [facility] (Inactive) as Pharmacist (Pharmacist) Kenney Peacemaker, MD as Consulting Physician (Gastroenterology) Thelda Finney Lucie Ruts, DO (Inactive) as Consulting Physician (Pulmonary Disease) Secundino Dach Harvey Linen., MD as Consulting Physician (Urology) Candyce Champagne, MD as Consulting Physician (General Surgery) Elmyra Haggard, MD as Consulting Physician (Cardiology)    INTERVAL HISTORY: Jesse [name] is a 73 y.o. 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 Previous medical history also includes hypertension, hyperlipidemia, GERD, anemia, COPD, and macular degeneration and a stroke during most recent hospitalization. Surgical history includes ileostomy and takedown. Peg tube placed (07/04/2022) for esophageal stricture/dysphagia. Palliative asked to see for symptom management and goals of care.   SOCIAL HISTORY:     reports that he quit smoking about 3 years ago. His smoking use included cigarettes. He started smoking about 43 years ago. He has a 20 pack-year smoking history. He has never used smokeless tobacco. He reports that he does not currently use alcohol after a past usage of about 15.0 standard drinks of alcohol per week. He reports that he does not currently use drugs after having used the following drugs: Marijuana.  ADVANCE DIRECTIVES:  Advanced directives on file  CODE STATUS: Full code  PAST MEDICAL HISTORY: Past Medical History:  Diagnosis Date   Anemia    Cancer (HCC)    bladder cancer   COPD (chronic obstructive pulmonary  disease) (HCC)    mild    DIVERTICULOSIS, COLON 01/14/2007   Qualifier: Diagnosis of  By: Rachell Budge RN, Arlena Belts    GASTRITIS, CHRONIC 06/01/2004   Qualifier: Diagnosis of  By: Tereasa Felty CMA (AAMA), Robin     GERD 01/09/2007   Qualifier: Diagnosis of  By: Ena Harries, RN, Ellen     Heart murmur    as a child; no murmur heard 12/14/19   History of radiation therapy    Mediastinum- 05/15/22-06/28/22-Dr. Retta Caster   HYPERLIPIDEMIA 01/09/2007   Qualifier: Diagnosis of  By: Georgie Kiss     Hypertension    Lung mass    left upper nodule   Macular degeneration    right eye   NEOPLASM, MALIGNANT, BLADDER, HX OF 2002   Qualifier: Diagnosis of  By: Tereasa Felty CMA (AAMA), Robin  / no chemo or radiation   OSTEOARTHRITIS 01/14/2007   Qualifier: Diagnosis of  By: Rachell Budge, RN, Arlena Belts - knees   Rectal mass    Reflux esophagitis 06/01/2004   Qualifier: Diagnosis of  By: Tereasa Felty CMA Nonie Beady), Robin     Stroke [facility])    2024   TOBACCO USER 02/16/2009   Qualifier: Diagnosis of  By: Minnette Amato  MD, Ronie Cohen    VITAMIN D  DEFICIENCY 06/03/2007   Qualifier: Diagnosis of  By: Minnette Amato  MD, Ronie Cohen     ALLERGIES:  has no known allergies.  MEDICATIONS:  Current Outpatient Medications  Medication Sig Dispense Refill   acetaminophen  (TYLENOL ) 500 MG tablet Take 1,000 mg by mouth every 8 (eight) hours as needed for moderate pain.     aspirin  EC 81 MG tablet Take 81 mg by mouth daily. Swallow whole.     atorvastatin  (  LIPITOR) 20 MG tablet Take 1 tablet (20 mg total) by mouth daily. 90 tablet 3   Cholecalciferol  (VITAMIN D3 PO) Take 2,000 Units by mouth daily.     dexamethasone  (DECADRON ) 4 MG tablet PLEASE TAKE 1 TABLET TWICE A DAY THE DAY BEFORE, THE DAY OF, AND THE DAY AFTER CHEMOTHERAPY 60 tablet 0   diphenoxylate -atropine  (LOMOTIL ) 2.5-0.025 MG tablet Take 2 tablets by mouth 3 (three) times daily as needed for diarrhea or loose stools. 90 tablet 0   ferrous sulfate  325 (65 FE) MG EC tablet Take 1  tablet (325 mg total) by mouth 2 (two) times daily. 60 tablet 1   folic acid  (FOLVITE ) 1 MG tablet TAKE 1 TABLET BY MOUTH EVERY DAY 90 tablet 1   gabapentin  (NEURONTIN ) 100 MG capsule Take 1 capsule (100 mg total) by mouth at bedtime. 90 capsule 3   lidocaine  (XYLOCAINE ) 5 % ointment Apply 1 Application topically 3 (three) times daily as needed for mild pain or moderate pain. 35.44 g 3   lidocaine -prilocaine  (EMLA ) cream Apply 1 Application topically as needed. 30 g 2   loperamide  (IMODIUM  A-D) 2 MG tablet Take 4 mg by mouth 4 (four) times daily as needed for diarrhea or loose stools.     mirtazapine  (REMERON  SOL-TAB) 30 MG disintegrating tablet TAKE 1 TABLET BY MOUTH EVERYDAY AT BEDTIME 90 tablet 1   Multiple Vitamins-Minerals (PRESERVISION AREDS 2) CAPS Take 1 capsule by mouth 2 (two) times daily.     OVER THE COUNTER MEDICATION Take 1 tablet by mouth daily. PROBIOTICS     OVER THE COUNTER MEDICATION Fiber and spice pt take every morning     OVER THE COUNTER MEDICATION Juce pt takes every morning     pantoprazole  (PROTONIX ) 40 MG tablet Take 1 tablet (40 mg total) by mouth 2 (two) times daily. 60 tablet 6   prochlorperazine  (COMPAZINE ) 10 MG tablet Take 1 tablet (10 mg total) by mouth every 6 (six) hours as needed for nausea or vomiting. 30 tablet 0   No current facility-administered medications for this visit.   Facility-Administered Medications Ordered in Other Visits  Medication Dose Route Frequency Provider Last Rate Last Admin   0.9 %  sodium chloride  infusion   Intravenous Continuous Marlene Simas, MD   Stopped at 08/20/23 1332    VITAL SIGNS: There were no vitals taken for this visit. There were no vitals filed for this visit.  Estimated body mass index is 17.53 kg/m as calculated from the following:   Height as of 07/30/23: 5\' 11"  (1.803 m).   Weight as of an earlier encounter on 08/20/23: 125 lb 11.2 oz (57 kg).   PERFORMANCE STATUS (ECOG) : 1 - Symptomatic but completely  ambulatory   Physical Exam General: NAD Cardiovascular: regular rate and rhythm Pulmonary: normal breathing pattern Extremities: no edema, no joint deformities Skin: no rashes Neurological: AAO x3  IMPRESSION: Discussed the use of AI scribe software for clinical note transcription with the patient, who gave verbal consent to proceed. History of Present Illness Jesse Corwell Shughart "[name]" is a 73 year old male who was seen during his infusion.  No acute distress noted.  No family present.  Patient expresses frustration with difficulty gaining weight despite good appetite.  He is actively followed by dietitian. They are working to address his low weight and has difficulty gaining weight despite efforts.  Patient's current weight is 125 pound down from 130 pounds on April 15.Aaron Aas  Recently, he started consuming a nutritional drink  recommended by the nutritionist. The drink is high in calories and contains various nutrients. He began this regimen a couple of days ago and finds the taste acceptable, which was a concern for him.  Denies pain.  No concerns for nausea, vomiting, constipation, or diarrhea.  No uncontrolled symptoms at this time.  We will continue to closely monitor and support as needed.  Goals of care  8/27- Mr. Prestia and his wife are realistic in their understanding regarding his cancer and recent progression. He is clear in his expressed wishes to continue to treat the treatable remaining hopeful for stability. His wife is hopeful that he is able to tolerate treatment. Their goal is to closely watch his quality of life ensuring he is doing as well as he can. [name] is emotional stating he knows if he does nothing he will "die" and he is not ready to "give up" yet. Support provided.     4/9- Mr. Mccrackin wishes to be a FULL CODE and pursue treatments that can improve his level of health and functioning. He has HCPOA listing his wife as Management consultant, and Living Will documents on file. Patient  and wife interested in MOST form to help consolidate wishes onto one form that could be honored by medical providers/EMS in the event of an emergency. They are taking form home for review and to decide if they would like to complete at next visit  I discussed the importance of continued conversation with family and their medical providers regarding overall plan of care and treatment options, ensuring decisions are within the context of the patients values and GOCs. Assessment & Plan Bowel Regimen Bowel symptoms (diarrhea) improved, manageable but not resolved. - Continue Lomotil .  Fatigue Persistent fatigue - Continue to remain as active as possible.  Weight loss Difficulty gaining weight despite nutritional intervention. - Continue nutritional supplement drink as per nutritionist's recommendation.  I will plan to see patient back in 6-8 weeks.   Patient expressed understanding and was in agreement with this plan. He also understands that He can call the clinic at any time with any questions, concerns, or complaints.   Any controlled substances utilized were prescribed in the context of palliative care. PDMP has been reviewed.   Visit consisted of counseling and education dealing with the complex and emotionally intense issues of symptom management and palliative care in the setting of serious and potentially life-threatening illness.  Dellia Ferguson, AGPCNP-BC  Palliative Medicine Team/[facility]

## 2023-08-21 MED ORDER — DIPHENOXYLATE-ATROPINE 2.5-0.025 MG PO TABS: 2.0000 | ORAL_TABLET | Freq: Four times a day (QID) | ORAL | 5 refills | Status: DC

## 2023-08-21 NOTE — Telephone Encounter (Signed)
 Patient wants to see you about possibly getting "the bag" again. The ileostomy Is negatively affecting his quality of life. He feels this is a conversation that would be best to have with you. Can I use a 7 day hold opening on 09/18/23? He does not feel this is a conversation to have with an APP. Please advise.

## 2023-08-21 NOTE — Telephone Encounter (Signed)
 I called him.  He has up to 6 small-volume soft stools a day that are affecting his quality of life.  We have tried to treat this diarrhea and have investigated it and I think it is really related to his new anorectal anatomy after successful treatment of GIST.  He has been using some Imodium  and Lomotil .  In the past he tried cholestyramine  and it did not help.  His chemotherapy could be contributing as the Alimta  can cause diarrhea.  I have prescribed Lomotil  for him to take 2 tablets 4 times a day regularly to see if that makes a difference.  I am copying Dr. Hershell Lose regarding patient's request to be considered to have another ileostomy and have advised the patient he would need to discuss that with Dr. Hershell Lose.  We can schedule [name] for a next available follow-up appointment.  I agree that the appointment with an app should be canceled given the complexity of the situation.

## 2023-08-22 NOTE — Telephone Encounter (Signed)
 Appointment changed to 09/18/23 at 10:10 with Dr. Willy Harvest. Attempted to reach patient to update on change. No answer, left VM for patient with new scheduled date and time. Instructed in VM for patient to call office back if this time will not work for him.

## 2023-08-26 NOTE — Telephone Encounter (Signed)
 Called back and spoke with spouse regarding previous note. She reports that patient told her someone from GI called regarding an appointment for tomorrow 08/27/23. Advised spouse that there is no documentation of someone calling and patient is currently scheduled for appointment for 09/18/23. Spouse verbalized understanding.

## 2023-08-26 NOTE — Telephone Encounter (Signed)
 Patient spouse requesting call back from nurse to discuss previous note.please advise.   Thank you

## 2023-08-27 DIAGNOSIS — Z8509 Personal history of malignant neoplasm of other digestive organs: Secondary | ICD-10-CM | POA: Diagnosis not present

## 2023-08-27 DIAGNOSIS — K6289 Other specified diseases of anus and rectum: Secondary | ICD-10-CM | POA: Diagnosis not present

## 2023-09-02 ENCOUNTER — Ambulatory Visit: Payer: Self-pay

## 2023-09-02 ENCOUNTER — Other Ambulatory Visit: Payer: Self-pay

## 2023-09-02 ENCOUNTER — Telehealth: Payer: Self-pay | Admitting: Medical Oncology

## 2023-09-02 ENCOUNTER — Emergency Department (HOSPITAL_BASED_OUTPATIENT_CLINIC_OR_DEPARTMENT_OTHER)

## 2023-09-02 ENCOUNTER — Inpatient Hospital Stay (HOSPITAL_BASED_OUTPATIENT_CLINIC_OR_DEPARTMENT_OTHER)
Admission: EM | Admit: 2023-09-02 | Discharge: 2023-09-04 | DRG: 811 | Disposition: A | Discharge status: Home / Self Care | Attending: Internal Medicine | Admitting: Internal Medicine

## 2023-09-02 ENCOUNTER — Encounter (HOSPITAL_COMMUNITY): Payer: Self-pay | Admitting: Family Medicine

## 2023-09-02 DIAGNOSIS — R0602 Shortness of breath: Secondary | ICD-10-CM | POA: Diagnosis not present

## 2023-09-02 DIAGNOSIS — Z87891 Personal history of nicotine dependence: Secondary | ICD-10-CM | POA: Diagnosis not present

## 2023-09-02 DIAGNOSIS — K219 Gastro-esophageal reflux disease without esophagitis: Secondary | ICD-10-CM | POA: Diagnosis not present

## 2023-09-02 DIAGNOSIS — K922 Gastrointestinal hemorrhage, unspecified: Secondary | ICD-10-CM | POA: Diagnosis not present

## 2023-09-02 DIAGNOSIS — J449 Chronic obstructive pulmonary disease, unspecified: Secondary | ICD-10-CM | POA: Diagnosis not present

## 2023-09-02 DIAGNOSIS — Z8673 Personal history of transient ischemic attack (TIA), and cerebral infarction without residual deficits: Secondary | ICD-10-CM

## 2023-09-02 DIAGNOSIS — K31819 Angiodysplasia of stomach and duodenum without bleeding: Secondary | ICD-10-CM | POA: Diagnosis not present

## 2023-09-02 DIAGNOSIS — K5521 Angiodysplasia of colon with hemorrhage: Secondary | ICD-10-CM | POA: Diagnosis not present

## 2023-09-02 DIAGNOSIS — C49A5 Gastrointestinal stromal tumor of rectum: Secondary | ICD-10-CM | POA: Diagnosis not present

## 2023-09-02 DIAGNOSIS — Z9089 Acquired absence of other organs: Secondary | ICD-10-CM

## 2023-09-02 DIAGNOSIS — D62 Acute posthemorrhagic anemia: Secondary | ICD-10-CM | POA: Diagnosis not present

## 2023-09-02 DIAGNOSIS — I2510 Atherosclerotic heart disease of native coronary artery without angina pectoris: Secondary | ICD-10-CM | POA: Diagnosis not present

## 2023-09-02 DIAGNOSIS — Z8551 Personal history of malignant neoplasm of bladder: Secondary | ICD-10-CM | POA: Diagnosis not present

## 2023-09-02 DIAGNOSIS — Z681 Body mass index (BMI) 19 or less, adult: Secondary | ICD-10-CM | POA: Diagnosis not present

## 2023-09-02 DIAGNOSIS — E785 Hyperlipidemia, unspecified: Secondary | ICD-10-CM | POA: Diagnosis not present

## 2023-09-02 DIAGNOSIS — Z923 Personal history of irradiation: Secondary | ICD-10-CM

## 2023-09-02 DIAGNOSIS — I10 Essential (primary) hypertension: Secondary | ICD-10-CM | POA: Diagnosis not present

## 2023-09-02 DIAGNOSIS — Z79631 Long term (current) use of antimetabolite agent: Secondary | ICD-10-CM

## 2023-09-02 DIAGNOSIS — Z85118 Personal history of other malignant neoplasm of bronchus and lung: Secondary | ICD-10-CM

## 2023-09-02 DIAGNOSIS — Z818 Family history of other mental and behavioral disorders: Secondary | ICD-10-CM

## 2023-09-02 DIAGNOSIS — K449 Diaphragmatic hernia without obstruction or gangrene: Secondary | ICD-10-CM | POA: Diagnosis present

## 2023-09-02 DIAGNOSIS — Z833 Family history of diabetes mellitus: Secondary | ICD-10-CM

## 2023-09-02 DIAGNOSIS — H3530 Unspecified macular degeneration: Secondary | ICD-10-CM | POA: Diagnosis present

## 2023-09-02 DIAGNOSIS — D508 Other iron deficiency anemias: Secondary | ICD-10-CM

## 2023-09-02 DIAGNOSIS — C3490 Malignant neoplasm of unspecified part of unspecified bronchus or lung: Secondary | ICD-10-CM | POA: Diagnosis present

## 2023-09-02 DIAGNOSIS — K31811 Angiodysplasia of stomach and duodenum with bleeding: Secondary | ICD-10-CM | POA: Diagnosis not present

## 2023-09-02 DIAGNOSIS — R634 Abnormal weight loss: Secondary | ICD-10-CM | POA: Diagnosis not present

## 2023-09-02 DIAGNOSIS — D649 Anemia, unspecified: Principal | ICD-10-CM | POA: Diagnosis present

## 2023-09-02 DIAGNOSIS — R64 Cachexia: Secondary | ICD-10-CM | POA: Diagnosis not present

## 2023-09-02 DIAGNOSIS — C3492 Malignant neoplasm of unspecified part of left bronchus or lung: Secondary | ICD-10-CM | POA: Diagnosis present

## 2023-09-02 DIAGNOSIS — K222 Esophageal obstruction: Secondary | ICD-10-CM | POA: Diagnosis not present

## 2023-09-02 DIAGNOSIS — Z8249 Family history of ischemic heart disease and other diseases of the circulatory system: Secondary | ICD-10-CM

## 2023-09-02 DIAGNOSIS — M1990 Unspecified osteoarthritis, unspecified site: Secondary | ICD-10-CM | POA: Diagnosis not present

## 2023-09-02 DIAGNOSIS — K2289 Other specified disease of esophagus: Secondary | ICD-10-CM | POA: Diagnosis not present

## 2023-09-02 DIAGNOSIS — J90 Pleural effusion, not elsewhere classified: Secondary | ICD-10-CM | POA: Diagnosis not present

## 2023-09-02 DIAGNOSIS — Z9889 Other specified postprocedural states: Secondary | ICD-10-CM

## 2023-09-02 DIAGNOSIS — K2951 Unspecified chronic gastritis with bleeding: Secondary | ICD-10-CM | POA: Diagnosis not present

## 2023-09-02 DIAGNOSIS — Z85048 Personal history of other malignant neoplasm of rectum, rectosigmoid junction, and anus: Secondary | ICD-10-CM

## 2023-09-02 DIAGNOSIS — Z8719 Personal history of other diseases of the digestive system: Secondary | ICD-10-CM

## 2023-09-02 DIAGNOSIS — K3189 Other diseases of stomach and duodenum: Secondary | ICD-10-CM | POA: Diagnosis not present

## 2023-09-02 DIAGNOSIS — Z79899 Other long term (current) drug therapy: Secondary | ICD-10-CM

## 2023-09-02 DIAGNOSIS — Z820 Family history of epilepsy and other diseases of the nervous system: Secondary | ICD-10-CM

## 2023-09-02 DIAGNOSIS — Z7982 Long term (current) use of aspirin: Secondary | ICD-10-CM

## 2023-09-02 DIAGNOSIS — Z9049 Acquired absence of other specified parts of digestive tract: Secondary | ICD-10-CM

## 2023-09-02 DIAGNOSIS — R630 Anorexia: Secondary | ICD-10-CM | POA: Diagnosis not present

## 2023-09-02 DIAGNOSIS — I700 Atherosclerosis of aorta: Secondary | ICD-10-CM | POA: Diagnosis not present

## 2023-09-02 DIAGNOSIS — K5520 Angiodysplasia of colon without hemorrhage: Secondary | ICD-10-CM

## 2023-09-02 DIAGNOSIS — K920 Hematemesis: Secondary | ICD-10-CM | POA: Diagnosis not present

## 2023-09-02 DIAGNOSIS — J439 Emphysema, unspecified: Secondary | ICD-10-CM | POA: Diagnosis not present

## 2023-09-02 LAB — CBC
HCT: 22.1 % — ABNORMAL LOW (ref 39.0–52.0)
HCT: 20.8 % — ABNORMAL LOW (ref 39.0–52.0)
Hemoglobin: 6.8 g/dL — CL (ref 13.0–17.0)
Hemoglobin: 6.3 g/dL — CL (ref 13.0–17.0)
MCH: 28.8 pg (ref 26.0–34.0)
MCH: 28.9 pg (ref 26.0–34.0)
MCHC: 30.8 g/dL (ref 30.0–36.0)
MCHC: 30.3 g/dL (ref 30.0–36.0)
MCV: 93.6 fL (ref 80.0–100.0)
MCV: 95.4 fL (ref 80.0–100.0)
Platelets: 132 10*3/uL — ABNORMAL LOW (ref 150–400)
Platelets: 134 10*3/uL — ABNORMAL LOW (ref 150–400)
RBC: 2.36 MIL/uL — ABNORMAL LOW (ref 4.22–5.81)
RBC: 2.18 MIL/uL — ABNORMAL LOW (ref 4.22–5.81)
RDW: 17.9 % — ABNORMAL HIGH (ref 11.5–15.5)
RDW: 17.9 % — ABNORMAL HIGH (ref 11.5–15.5)
WBC: 6.1 10*3/uL (ref 4.0–10.5)
WBC: 4.9 10*3/uL (ref 4.0–10.5)
nRBC: 0 % (ref 0.0–0.2)
nRBC: 0 % (ref 0.0–0.2)

## 2023-09-02 LAB — BASIC METABOLIC PANEL WITH GFR
Anion gap: 11 (ref 5–15)
BUN: 28 mg/dL — ABNORMAL HIGH (ref 8–23)
CO2: 26 mmol/L (ref 22–32)
Calcium: 9.3 mg/dL (ref 8.9–10.3)
Chloride: 96 mmol/L — ABNORMAL LOW (ref 98–111)
Creatinine, Ser: 0.55 mg/dL — ABNORMAL LOW (ref 0.61–1.24)
GFR, Estimated: >60 mL/min (ref >60)
Glucose, Bld: 156 mg/dL — ABNORMAL HIGH (ref 70–99)
Potassium: 3.9 mmol/L (ref 3.5–5.1)
Sodium: 133 mmol/L — ABNORMAL LOW (ref 135–145)

## 2023-09-02 LAB — PREPARE RBC (CROSSMATCH)

## 2023-09-02 LAB — LACTIC ACID, PLASMA: Lactic Acid, Venous: 0.9 mmol/L (ref 0.5–1.9)

## 2023-09-02 LAB — TROPONIN T, HIGH SENSITIVITY
Troponin T High Sensitivity: 20 ng/L — ABNORMAL HIGH (ref <19)
Troponin T High Sensitivity: 19 ng/L — ABNORMAL HIGH (ref <19)

## 2023-09-02 MED ORDER — ONDANSETRON HCL 4 MG/2ML IJ SOLN: 4.0000 mg | Freq: Four times a day (QID) | INTRAMUSCULAR | Status: DC | PRN

## 2023-09-02 MED ORDER — CHLORHEXIDINE GLUCONATE CLOTH 2 % EX PADS
6.0000 | MEDICATED_PAD | Freq: Every day | CUTANEOUS | Status: DC
  Administered 2023-09-03 – 2023-09-04 (×2): 6 via TOPICAL

## 2023-09-02 MED ORDER — IPRATROPIUM-ALBUTEROL 0.5-2.5 (3) MG/3ML IN SOLN
3.0000 mL | Freq: Once | RESPIRATORY_TRACT | Status: AC
  Administered 2023-09-02: 3 mL via RESPIRATORY_TRACT
  Filled 2023-09-02: qty 3

## 2023-09-02 MED ORDER — PANTOPRAZOLE SODIUM 40 MG IV SOLR
80.0000 mg | Freq: Once | INTRAVENOUS | Status: AC
Start: 2023-09-02 — End: 2023-09-02
  Administered 2023-09-02: 80 mg via INTRAVENOUS
  Filled 2023-09-02: qty 20

## 2023-09-02 MED ORDER — SODIUM CHLORIDE 0.9% FLUSH: 10.0000 mL | INTRAVENOUS | Status: DC | PRN

## 2023-09-02 MED ORDER — IOHEXOL 350 MG/ML SOLN
100.0000 mL | Freq: Once | INTRAVENOUS | Status: AC | PRN
  Administered 2023-09-02: 60 mL via INTRAVENOUS

## 2023-09-02 MED ORDER — SODIUM CHLORIDE 0.9% FLUSH
3.0000 mL | Freq: Two times a day (BID) | INTRAVENOUS | Status: DC
  Administered 2023-09-02 – 2023-09-03 (×2): 3 mL via INTRAVENOUS

## 2023-09-02 MED ORDER — ACETAMINOPHEN 325 MG PO TABS: 650.0000 mg | ORAL_TABLET | Freq: Four times a day (QID) | ORAL | Status: DC | PRN

## 2023-09-02 MED ORDER — LACTATED RINGERS IV SOLN: INTRAVENOUS | Status: DC

## 2023-09-02 MED ORDER — ACETAMINOPHEN 650 MG RE SUPP: 650.0000 mg | Freq: Four times a day (QID) | RECTAL | Status: DC | PRN

## 2023-09-02 MED ORDER — IPRATROPIUM-ALBUTEROL 0.5-2.5 (3) MG/3ML IN SOLN: 3.0000 mL | RESPIRATORY_TRACT | Status: DC | PRN

## 2023-09-02 MED ORDER — SODIUM CHLORIDE 0.9% IV SOLUTION: Freq: Once | INTRAVENOUS | Status: AC

## 2023-09-02 MED ORDER — ONDANSETRON HCL 4 MG PO TABS: 4.0000 mg | ORAL_TABLET | Freq: Four times a day (QID) | ORAL | Status: DC | PRN

## 2023-09-02 MED ORDER — PANTOPRAZOLE SODIUM 40 MG IV SOLR
40.0000 mg | Freq: Two times a day (BID) | INTRAVENOUS | Status: DC
  Administered 2023-09-03: 40 mg via INTRAVENOUS
  Filled 2023-09-02: qty 10

## 2023-09-02 NOTE — ED Notes (Signed)
 Urinal provided.

## 2023-09-02 NOTE — Telephone Encounter (Signed)
  Chief Complaint: sob Symptoms: sob Frequency: today Pertinent Negatives: Patient denies  Disposition: [x] ED /[] Urgent Care (no appt availability in office) / [] Appointment(In office/virtual)/ []  [place] [facility] [facility]/ [] Home Care/ [] Refused Recommended Disposition /[] [place] [facility]/ []  Follow-up with PCP Additional Notes: pt wife states that pt is very weak and when walking he is getting out of breath.  States that his O2 sat is 89-92% HR 118 about 5-10 minutes ago. Denies pt with home O2.   Copied from CRM [id number]. Topic: Clinical - Red Word Triage >> Sep 02, 2023 11:51 AM Jenice Mitts wrote: Red Word that prompted transfer to Nurse Triage: SHORT OF BREATH, oxygen is at 90 Reason for Disposition  Oxygen level (e.g., pulse oximetry) 90 percent or lower  Protocols used: Breathing Difficulty-A-AH

## 2023-09-02 NOTE — ED Provider Notes (Addendum)
  Physical Exam  BP 105/69   Pulse (!) 102   Temp 98.1 F (36.7 C)   Resp 18   SpO2 94%   Physical Exam  Procedures  Procedures  ED Course / MDM    Medical Decision Making Amount and/or Complexity of Data Reviewed Labs: ordered. Radiology: ordered.  Risk Prescription drug management. Decision regarding hospitalization.     54M hx of lung cancer, anemic to 6.8, presenting with SOB, did have hematemesis yesterday. Vitally stable, waiting on CTA to rule out PE, then needs admission. Got Protonix , pt aware of need for admission.   CTA PE:  IMPRESSION:  1. No CT evidence of pulmonary artery embolus.  2. Small right pleural effusion.  3. Similar appearance of left perihilar consolidative changes,  likely post treatment changes and fibrosis. Recurrent disease is not  excluded.  4. Aortic Atherosclerosis (ICD10-I70.0) and Emphysema (ICD10-J43.9).   Medicine consulted for admission, plan for GI consult non-emergently inpatient, Dr. Gwynneth Lessen accepting.  Gastroenterology consulted, initially spoke with Dr. Honey Lusty however the patient is an established patient with [facility] paged but consult was pending at time of admission.  [facility] was messaged, Dr. Rosaline Coma sent a message through the epic messaging system for nonemergent consultation.  Did receive a response from Dr. Rosaline Coma that the patient will be seen in nonemergent consultation in the a.m.     Rosealee Concha, MD 09/02/23 1754    Rosealee Concha, MD 09/02/23 [phone]

## 2023-09-02 NOTE — ED Notes (Signed)
 This RN attempted to call report to 4E x1 with no answer

## 2023-09-02 NOTE — ED Notes (Signed)
 Kim with called for transport

## 2023-09-02 NOTE — ED Provider Notes (Signed)
 [place] [facility] AT [facility] Provider Note   CSN: [id number] Arrival date & time: 09/02/23  1240     History  Chief Complaint  Patient presents with   Shortness of Breath    Jesse [name] is a 73 y.o. male.  73 year old male with past medical history of lung cancer on maintenance chemotherapy as well as hyperlipidemia presenting to the emergency department today with shortness of breath.  The patient states that he has been having some acute shortness of breath since early this morning.  He reports minimal cough with this.  Denies any fevers.  He states that he was not the process of working with care provider to get some home oxygen but was lost to follow-up and is currently not on any oxygen normally.  He reached out to his doctor's office today and was told to come to the ER for further evaluation.   Shortness of Breath      Home Medications Prior to Admission medications   Medication Sig Start Date End Date Taking? Authorizing Provider  acetaminophen  (TYLENOL ) 500 MG tablet Take 1,000 mg by mouth every 8 (eight) hours as needed for moderate pain.    [provider]  aspirin  EC 81 MG tablet Take 81 mg by mouth daily. Swallow whole.    [provider]  atorvastatin  (LIPITOR) 20 MG tablet Take 1 tablet (20 mg total) by mouth daily. 01/23/23   [name], MD  Cholecalciferol  (VITAMIN D3 PO) Take 2,000 Units by mouth daily.    [provider]  dexamethasone  (DECADRON ) 4 MG tablet PLEASE TAKE 1 TABLET TWICE A DAY THE DAY BEFORE, THE DAY OF, AND THE DAY AFTER CHEMOTHERAPY 08/08/23   Heilingoetter, Cassandra L, PA-C  diphenoxylate -atropine  (LOMOTIL ) 2.5-0.025 MG tablet Take 2 tablets by mouth 4 (four) times daily. 08/21/23   Kenney Peacemaker, MD  ferrous sulfate  325 (65 FE) MG EC tablet Take 1 tablet (325 mg total) by mouth 2 (two) times daily. 08/01/22 08/01/23  [name], MD  folic acid  (FOLVITE ) 1 MG tablet TAKE 1 TABLET  BY MOUTH EVERY DAY 08/08/23   Marlene Simas, MD  gabapentin  (NEURONTIN ) 100 MG capsule Take 1 capsule (100 mg total) by mouth at bedtime. 07/17/23   [name], MD  lidocaine  (XYLOCAINE ) 5 % ointment Apply 1 Application topically 3 (three) times daily as needed for mild pain or moderate pain. 08/15/22   Pickenpack-Cousar, Athena N, NP  lidocaine -prilocaine  (EMLA ) cream Apply 1 Application topically as needed. 05/07/23   Heilingoetter, Cassandra L, PA-C  loperamide  (IMODIUM  A-D) 2 MG tablet Take 4 mg by mouth 4 (four) times daily as needed for diarrhea or loose stools.    [provider]  mirtazapine  (REMERON  SOL-TAB) 30 MG disintegrating tablet TAKE 1 TABLET BY MOUTH EVERYDAY AT BEDTIME 08/08/23   Kenney Peacemaker, MD  Multiple Vitamins-Minerals (PRESERVISION AREDS 2) CAPS Take 1 capsule by mouth 2 (two) times daily.    [provider]  OVER THE COUNTER MEDICATION Take 1 tablet by mouth daily. PROBIOTICS    [provider]  OVER THE COUNTER MEDICATION Fiber and spice pt take every morning    [provider]  OVER THE COUNTER MEDICATION Juce pt takes every morning    [provider]  pantoprazole  (PROTONIX ) 40 MG tablet Take 1 tablet (40 mg total) by mouth 2 (two) times daily. 05/28/23   Pickenpack-Cousar, Giles Labrum, NP  prochlorperazine  (COMPAZINE ) 10 MG tablet Take 1 tablet (10  mg total) by mouth every 6 (six) hours as needed for nausea or vomiting. 12/11/22   Marlene Simas, MD      Allergies    Patient has no known allergies.    Review of Systems   Review of Systems  Respiratory:  Positive for shortness of breath.     Physical Exam Updated Vital Signs BP 105/69   Pulse (!) 102   Temp 98.1 F (36.7 C)   Resp 18   SpO2 94%  Physical Exam Vitals and nursing note reviewed.   Gen: NAD, chronically ill-appearing, mild conversational dyspnea noted Eyes: PERRL, EOMI HEENT: no oropharyngeal swelling Neck: trachea midline Resp: Diminished  with wheezes throughout all lung fields Card: RRR, no murmurs, rubs, or gallops Abd: nontender, nondistended Extremities: no calf tenderness, no edema Vascular: 2+ radial pulses bilaterally, 2+ DP pulses bilaterally Skin: no rashes Psyc: acting appropriately   ED Results / Procedures / Treatments   Labs (all labs ordered are listed, but only abnormal results are displayed) Labs Reviewed  BASIC METABOLIC PANEL WITH GFR - Abnormal; Notable for the following components:      Result Value   Sodium 133 (*)    Chloride 96 (*)    Glucose, Bld 156 (*)    BUN 28 (*)    Creatinine, Ser 0.55 (*)    All other components within normal limits  CBC - Abnormal; Notable for the following components:   RBC 2.36 (*)    Hemoglobin 6.8 (*)    HCT 22.1 (*)    RDW 17.9 (*)    Platelets 132 (*)    All other components within normal limits  TROPONIN T, HIGH SENSITIVITY - Abnormal; Notable for the following components:   Troponin T High Sensitivity 20 (*)    All other components within normal limits  LACTIC ACID, PLASMA  LACTIC ACID, PLASMA  TROPONIN T, HIGH SENSITIVITY    EKG EKG Interpretation Date/Time:  Monday Sep 02 2023 12:54:13 EDT Ventricular Rate:  122 PR Interval:  150 QRS Duration:  88 QT Interval:  326 QTC Calculation: 464 R Axis:   66  Text Interpretation: Sinus tachycardia Left ventricular hypertrophy with repolarization abnormality ( Cornell product ) Inferior infarct , age undetermined Abnormal ECG When compared with ECG of 06-Jun-2022 04:35, Premature atrial complexes are no longer Present QRS duration has decreased Inferior infarct is now Present Non-specific change in ST segment in Lateral leads T wave inversion now evident in Lateral leads Confirmed by Abner Hoffman [phone]) on 09/02/2023 1:02:21 PM  Radiology No results found.  Procedures Procedures    Medications Ordered in ED Medications  pantoprazole  (PROTONIX ) injection 80 mg (has no administration in time range)   ipratropium-albuterol  (DUONEB) 0.5-2.5 (3) MG/3ML nebulizer solution 3 mL (3 mLs Nebulization Given 09/02/23 1333)  iohexol  (OMNIPAQUE ) 350 MG/ML injection 100 mL (60 mLs Intravenous Contrast Given 09/02/23 1512)    ED Course/ Medical Decision Making/ A&P                                 Medical Decision Making 73 year old male with past medical history of lung cancer on maintenance chemotherapy and hyperlipidemia presenting to the emergency department today with shortness of breath.  The patient is tachycardic here on arrival.  I will further evaluate him here with basic labs as well as a CT angiogram to evaluate for pulmonary embolism or infiltrates.  Suspicion for subarachnoid hemorrhage is low.  May be due to bronchitis or COPD as well.  Will give patient DuoNebs here.  Will obtain a lactic acid and blood cultures as well although suspicion for sepsis is relatively low at this time.  I will reevaluate for ultimate disposition.  The patient's hemoglobin came back at 6.8.  He is given Protonix .  Plan will be for admission.  He states that he did have 2 episodes of red-ish emesis yesterday.  CT scan is pending at the time of signout.  Plan is for admission after CT is resulted.  Amount and/or Complexity of Data Reviewed Labs: ordered. Radiology: ordered.  Risk Prescription drug management.           Final Clinical Impression(s) / ED Diagnoses Final diagnoses:  Symptomatic anemia    Rx / DC Orders ED Discharge Orders     None         Carin Charleston, MD 09/02/23 1609

## 2023-09-02 NOTE — ED Triage Notes (Signed)
 C/o SHOB starting today, Hx of lung cancer and actively getting treatment.  Denies home oxygen use.

## 2023-09-02 NOTE — H&P (Signed)
 History and Physical    Jesse [name] ZOX:[id number] DOB: Feb 23, 1951 DOA: 09/02/2023  PCP: [name], MD   Patient coming from: Home   Chief Complaint: SOB, reddish vomit  HPI: Jesse [name] is a 73 y.o. 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  The patient complains of SOB since this morning without cough, fever, or chills. Last night, he vomited after taking his medications. His wife said that it looked like red blood. The patient is not sure if there was blood in the vomit but has not experienced any nausea since then. He denies abdominal pain, melena, hematochezia, or recent NSAID use.    [facility] ED Course: Upon arrival to the ED, patient is found to be afebrile and saturating well on room air with elevated heart rate and stable BP.  Labs are most notable for BUN 28, creatinine 0.55, hemoglobin 6.8, and normal lactic acid.  CTA chest is negative for PE.  GI (Dr. Rosaline Coma) was consulted by the ED physician and the patient was treated with IV Protonix  and DuoNeb.  He was transferred was a long hospital for admission.  Review of Systems:  All other systems reviewed and apart from HPI, are negative.  Past Medical History:  Diagnosis Date   Anemia    Cancer (HCC)    bladder cancer   COPD (chronic obstructive pulmonary disease) (HCC)    mild    DIVERTICULOSIS, COLON 01/14/2007   Qualifier: Diagnosis of  By: Rachell Budge RN, Arlena Belts    GASTRITIS, CHRONIC 06/01/2004   Qualifier: Diagnosis of  By: Tereasa Felty CMA (AAMA), Robin     GERD 01/09/2007   Qualifier: Diagnosis of  By: Ena Harries, RN, Ellen     Heart murmur    as a child; no murmur heard 12/14/19   History of radiation therapy    Mediastinum- 05/15/22-06/28/22-Dr. Retta Caster   HYPERLIPIDEMIA 01/09/2007   Qualifier: Diagnosis of  By: Georgie Kiss     Hypertension    Lung mass     left upper nodule   Macular degeneration    right eye   NEOPLASM, MALIGNANT, BLADDER, HX OF 2002   Qualifier: Diagnosis of  By: Tereasa Felty CMA (AAMA), Robin  / no chemo or radiation   OSTEOARTHRITIS 01/14/2007   Qualifier: Diagnosis of  By: Rachell Budge, RN, Arlena Belts - knees   Rectal mass    Reflux esophagitis 06/01/2004   Qualifier: Diagnosis of  By: Tereasa Felty CMA Nonie Beady), Robin     Stroke [facility])    2024   TOBACCO USER 02/16/2009   Qualifier: Diagnosis of  By: Minnette Amato  MD, Ronie Cohen    VITAMIN D  DEFICIENCY 06/03/2007   Qualifier: Diagnosis of  By: Minnette Amato  MD, Ronie Cohen     Past Surgical History:  Procedure Laterality Date   BIOPSY  06/18/2022   Procedure: BIOPSY;  Surgeon: Annis Kinder, DO;  Location: [facility];  Service: Gastroenterology;;   BREAST BIOPSY Left 03/16/2022   US  LT RADIOACTIVE SEED LOC 03/16/2022 [facility]   BRONCHIAL BIOPSY  10/13/2019   Procedure: BRONCHIAL BIOPSIES;  Surgeon: Prudy Brownie, DO;  Location: [facility];  Service: Pulmonary;;   BRONCHIAL BIOPSY  12/04/2022   Procedure: BRONCHIAL BIOPSIES;  Surgeon: Prudy Brownie, DO;  Location: [facility];  Service: Pulmonary;;   BRONCHIAL BRUSHINGS  10/13/2019   Procedure: BRONCHIAL BRUSHINGS;  Surgeon:  Prudy Brownie, DO;  [facility]: [facility];  Service: Pulmonary;;   BRONCHIAL NEEDLE ASPIRATION BIOPSY  10/13/2019   Procedure: BRONCHIAL NEEDLE ASPIRATION BIOPSIES;  Surgeon: Prudy Brownie, DO;  [facility]: [facility];  Service: Pulmonary;;   BRONCHIAL NEEDLE ASPIRATION BIOPSY  04/11/2022   Procedure: BRONCHIAL NEEDLE ASPIRATION BIOPSIES;  Surgeon: Prudy Brownie, DO;  Location: [facility];  Service: Cardiopulmonary;;   BRONCHIAL NEEDLE ASPIRATION BIOPSY  12/04/2022   Procedure: BRONCHIAL NEEDLE ASPIRATION BIOPSIES;  Surgeon: Prudy Brownie, DO;  [facility];  Service: Pulmonary;;   BRONCHIAL WASHINGS  10/13/2019   Procedure: BRONCHIAL WASHINGS;  Surgeon: Prudy Brownie,  DO;  [facility];  Service: Pulmonary;;   BRONCHIAL WASHINGS  12/04/2022   Procedure: BRONCHIAL WASHINGS;  Surgeon: Prudy Brownie, DO;  [facility];  Service: Pulmonary;;   COLONOSCOPY     multiple   DIVERTING ILEOSTOMY N/A 12/14/2021   Procedure: DIVERTING ILEOSTOMY;  Surgeon: Candyce Champagne, MD;  Location: [facility];  Service: General;  Laterality: N/A;   ENDOBRONCHIAL ULTRASOUND Bilateral 04/11/2022   Procedure: ENDOBRONCHIAL ULTRASOUND;  Surgeon: Prudy Brownie, DO;  Location: [facility];  Service: Cardiopulmonary;  Laterality: Bilateral;   ESOPHAGEAL DILATION  06/18/2022   Procedure: ESOPHAGEAL DILATION;  Surgeon: Annis Kinder, DO;  Location: [facility];  Service: Gastroenterology;;   ESOPHAGOGASTRODUODENOSCOPY (EGD) WITH PROPOFOL  N/A 06/18/2022   Procedure: ESOPHAGOGASTRODUODENOSCOPY (EGD) WITH PROPOFOL ;  Surgeon: Annis Kinder, DO;  Location: [facility];  Service: Gastroenterology;  Laterality: N/A;  Severe dysphagia/odynophagia ?radiation esophagitis versus oral candidiasis   FIDUCIAL MARKER PLACEMENT  12/04/2022   Procedure: FIDUCIAL MARKER PLACEMENT;  Surgeon: Prudy Brownie, DO;  [facility];  Service: Pulmonary;;   ILEOSTOMY CLOSURE N/A 03/22/2022   Procedure: OPEN TAKEDOWN OF LOOP ILEOSTOMY;  Surgeon: Candyce Champagne, MD;  Location: [facility];  Service: General;  Laterality: N/A;  GEN w/ERAS PATHWAY LOCAL   INTERCOSTAL NERVE BLOCK Left 12/16/2019   Procedure: INTERCOSTAL NERVE BLOCK;  Surgeon: Hilarie Lovely, MD;  Location: [facility];  Service: Thoracic;  Laterality: Left;   IR FLUORO RM 30-60 MIN  07/02/2022   IR IMAGING GUIDED PORT INSERTION  05/21/2023   KNEE SURGERY  07/20/2008   bilat.   NASAL SEPTUM SURGERY  1995   NODE DISSECTION Left 12/16/2019   Procedure: NODE DISSECTION;  Surgeon: Hilarie Lovely, MD;  [facility]: [facility];  Service: Thoracic;  Laterality: Left;   PEG PLACEMENT N/A 07/04/2022   Procedure: PERCUTANEOUS ENDOSCOPIC  GASTROSTOMY (PEG) PLACEMENT;  Surgeon: Kenney Peacemaker, MD;  Location: [facility];  Service: Gastroenterology;  Laterality: N/A;   RADIOACTIVE SEED GUIDED AXILLARY SENTINEL LYMPH NODE Left 03/20/2022   Procedure: RADIOACTIVE SEED GUIDED LEFT AXILLARY SENTINEL LYMPH NODE BIOPSY;  Surgeon: Sim Dryer, MD;  Location: [facility];  Service: General;  Laterality: Left;   RECTAL EXAM UNDER ANESTHESIA N/A 03/22/2022   Procedure: ANORECTAL EXAMINATION UNDER ANESTHESIA;  Surgeon: Candyce Champagne, MD;  Location: [facility];  Service: General;  Laterality: N/A;   TONSILLECTOMY     TRANSURETHRAL RESECTION OF BLADDER TUMOR  2002   UPPER GASTROINTESTINAL ENDOSCOPY     VIDEO BRONCHOSCOPY  04/11/2022   Procedure: VIDEO BRONCHOSCOPY;  Surgeon: Prudy Brownie, DO;  Location: [facility];  Service: Cardiopulmonary;;   VIDEO BRONCHOSCOPY WITH ENDOBRONCHIAL NAVIGATION N/A 10/13/2019   Procedure: VIDEO BRONCHOSCOPY WITH ENDOBRONCHIAL NAVIGATION;  Surgeon: Prudy Brownie, DO;  [facility];  Service: Pulmonary;  Laterality: N/A;   WISDOM TOOTH EXTRACTION  XI ROBOTIC ASSISTED LOWER ANTERIOR RESECTION N/A 12/14/2021   Procedure: XI ROBOTIC ASSISTED LOWER ANTERIOR ULTRA LOW RECTOSIGMOID RESECTION, COLOANAL HAND SEWN ANASTOMOSIS, AND BILATERAL TAP BLOCK;  Surgeon: Candyce Champagne, MD;  Location: [facility];  Service: General;  Laterality: N/A;    Social History:   reports that he quit smoking about 3 years ago. His smoking use included cigarettes. He started smoking about 43 years ago. He has a 20 pack-year smoking history. He has never used smokeless tobacco. He reports that he does not currently use alcohol after a past usage of about 15.0 standard drinks of alcohol per week. He reports that he does not currently use drugs after having used the following drugs: Marijuana.  No Known Allergies  Family History  Problem Relation Age of Onset   Dementia Mother    Diabetes Father    Mental illness Father    Peripheral  vascular disease Brother    Colon cancer Neg Hx    Esophageal cancer Neg Hx    Stomach cancer Neg Hx    Rectal cancer Neg Hx      Prior to Admission medications   Medication Sig Start Date End Date Taking? Authorizing Provider  acetaminophen  (TYLENOL ) 500 MG tablet Take 1,000 mg by mouth every 8 (eight) hours as needed for moderate pain.    [provider]  aspirin  EC 81 MG tablet Take 81 mg by mouth daily. Swallow whole.    [provider]  atorvastatin  (LIPITOR) 20 MG tablet Take 1 tablet (20 mg total) by mouth daily. 01/23/23   [name], MD  Cholecalciferol  (VITAMIN D3 PO) Take 2,000 Units by mouth daily.    [provider]  dexamethasone  (DECADRON ) 4 MG tablet PLEASE TAKE 1 TABLET TWICE A DAY THE DAY BEFORE, THE DAY OF, AND THE DAY AFTER CHEMOTHERAPY 08/08/23   Heilingoetter, Cassandra L, PA-C  diphenoxylate -atropine  (LOMOTIL ) 2.5-0.025 MG tablet Take 2 tablets by mouth 4 (four) times daily. 08/21/23   Kenney Peacemaker, MD  ferrous sulfate  325 (65 FE) MG EC tablet Take 1 tablet (325 mg total) by mouth 2 (two) times daily. 08/01/22 08/01/23  [name], MD  folic acid  (FOLVITE ) 1 MG tablet TAKE 1 TABLET BY MOUTH EVERY DAY 08/08/23   Marlene Simas, MD  gabapentin  (NEURONTIN ) 100 MG capsule Take 1 capsule (100 mg total) by mouth at bedtime. 07/17/23   [name], MD  lidocaine  (XYLOCAINE ) 5 % ointment Apply 1 Application topically 3 (three) times daily as needed for mild pain or moderate pain. 08/15/22   Pickenpack-Cousar, Athena N, NP  lidocaine -prilocaine  (EMLA ) cream Apply 1 Application topically as needed. 05/07/23   Heilingoetter, Cassandra L, PA-C  loperamide  (IMODIUM  A-D) 2 MG tablet Take 4 mg by mouth 4 (four) times daily as needed for diarrhea or loose stools.    [provider]  mirtazapine  (REMERON  SOL-TAB) 30 MG disintegrating tablet TAKE 1 TABLET BY MOUTH EVERYDAY AT BEDTIME 08/08/23   Kenney Peacemaker, MD  Multiple Vitamins-Minerals  (PRESERVISION AREDS 2) CAPS Take 1 capsule by mouth 2 (two) times daily.    [provider]  OVER THE COUNTER MEDICATION Take 1 tablet by mouth daily. PROBIOTICS    [provider]  OVER THE COUNTER MEDICATION Fiber and spice pt take every morning    [provider]  OVER THE COUNTER MEDICATION Juce pt takes every morning    [provider]  pantoprazole  (PROTONIX ) 40 MG tablet Take 1 tablet (40 mg total)  by mouth 2 (two) times daily. 05/28/23   Pickenpack-Cousar, Giles Labrum, NP  prochlorperazine  (COMPAZINE ) 10 MG tablet Take 1 tablet (10 mg total) by mouth every 6 (six) hours as needed for nausea or vomiting. 12/11/22   Marlene Simas, MD    Physical Exam: Vitals:   09/02/23 1545 09/02/23 1745 09/02/23 1919 09/02/23 1930  BP: 107/69 112/66 109/67   Pulse: 99 91 92   Resp: 17  18   Temp:  98 F (36.7 C) (!) 97.5 F (36.4 C)   TempSrc:   Oral   SpO2: 96% 94% 98%   Weight:    56.7 kg  Height:    5\' 8"  (1.727 m)     Constitutional: NAD, pale, no diaphoresis   Eyes: PERTLA, lids and conjunctivae normal ENMT: Mucous membranes are moist. Posterior pharynx clear of any exudate or lesions.   Neck: supple, no masses  Respiratory: no wheezing, no crackles. No accessory muscle use.  Cardiovascular: S1 & S2 heard, regular rate and rhythm. No extremity edema.  Abdomen: No distension, no tenderness, soft. Bowel sounds active.  Musculoskeletal: no clubbing / cyanosis. No joint deformity upper and lower extremities.   Skin: no significant rashes, lesions, ulcers. Warm, dry, well-perfused. Neurologic: CN 2-12 grossly intact. Moving all extremities. Alert and oriented.  Psychiatric: Pleasant. Cooperative.    Labs and Imaging on Admission: I have personally reviewed following labs and imaging studies  CBC: Recent Labs  Lab 09/02/23 1408  WBC 6.1  HGB 6.8*  HCT 22.1*  MCV 93.6  PLT 132*   Basic Metabolic Panel: Recent Labs  Lab 09/02/23 1408  NA 133*   K 3.9  CL 96*  CO2 26  GLUCOSE 156*  BUN 28*  CREATININE 0.55*  CALCIUM  9.3   GFR: Estimated Creatinine Clearance: 65 mL/min (A) (by C-G formula based on SCr of 0.55 mg/dL (L)). Liver Function Tests: No results for input(s): "AST", "ALT", "ALKPHOS", "BILITOT", "PROT", "ALBUMIN " in the last 168 hours. No results for input(s): "LIPASE", "AMYLASE" in the last 168 hours. No results for input(s): "AMMONIA" in the last 168 hours. Coagulation Profile: No results for input(s): "INR", "PROTIME" in the last 168 hours. Cardiac Enzymes: No results for input(s): "CKTOTAL", "CKMB", "CKMBINDEX", "TROPONINI" in the last 168 hours. BNP (last 3 results) No results for input(s): "PROBNP" in the last 8760 hours. HbA1C: No results for input(s): "HGBA1C" in the last 72 hours. CBG: No results for input(s): "GLUCAP" in the last 168 hours. Lipid Profile: No results for input(s): "CHOL", "HDL", "LDLCALC", "TRIG", "CHOLHDL", "LDLDIRECT" in the last 72 hours. Thyroid  Function Tests: No results for input(s): "TSH", "T4TOTAL", "FREET4", "T3FREE", "THYROIDAB" in the last 72 hours. Anemia Panel: No results for input(s): "VITAMINB12", "FOLATE", "FERRITIN", "TIBC", "IRON", "RETICCTPCT" in the last 72 hours. Urine analysis:    Component Value Date/Time   COLORURINE YELLOW 08/06/2022 1557   APPEARANCEUR CLEAR 08/06/2022 1557   LABSPEC 1.024 08/06/2022 1557   PHURINE 7.5 08/06/2022 1557   GLUCOSEU NEGATIVE 08/06/2022 1557   GLUCOSEU NEGATIVE 03/23/2010 0755   HGBUR LARGE (A) 08/06/2022 1557   HGBUR trace-lysed 02/09/2009 0931   BILIRUBINUR NEGATIVE 08/06/2022 1557   BILIRUBINUR n 05/26/2015 1031   KETONESUR NEGATIVE 08/06/2022 1557   PROTEINUR TRACE (A) 08/06/2022 1557   UROBILINOGEN 0.2 05/26/2015 1031   UROBILINOGEN 0.2 03/23/2010 0755   NITRITE NEGATIVE 08/06/2022 1557   LEUKOCYTESUR NEGATIVE 08/06/2022 1557   Sepsis Labs: @LABRCNTIP (procalcitonin:4,lacticidven:4) )No results found for this or  any previous visit (from the past 240  hours).   Radiological Exams on Admission: CT Angio Chest PE W and/or Wo Contrast Result Date: 09/02/2023 CLINICAL DATA:  Concern for pulmonary embolism. Metastatic lung cancer. EXAM: CT ANGIOGRAPHY CHEST WITH CONTRAST TECHNIQUE: Multidetector CT imaging of the chest was performed using the standard protocol during bolus administration of intravenous contrast. Multiplanar CT image reconstructions and MIPs were obtained to evaluate the vascular anatomy. RADIATION DOSE REDUCTION: This exam was performed according to the departmental dose-optimization program which includes automated exposure control, adjustment of the mA and/or kV according to patient size and/or use of iterative reconstruction technique. CONTRAST:  60mL OMNIPAQUE  IOHEXOL  350 MG/ML SOLN COMPARISON:  Chest CT dated 07/18/2023. FINDINGS: Cardiovascular: There is no cardiomegaly or pericardial effusion. Advanced 3 vessel coronary vascular calcification. Moderate atherosclerotic calcification of the thoracic aorta. No aneurysmal dilatation or dissection. The origins of the great vessels of the aortic arch are patent. Right-sided Port-A-Cath with tip in the right atrium. No pulmonary artery embolus identified. Mediastinum/Nodes: No obvious adenopathy. Evaluation however is limited due to infiltration of the hilar and mediastinal fat planes. Mild diffuse circumferential thickening of the esophagus may be related to underdistention. No mediastinal fluid collection. Lungs/Pleura: Small right pleural effusion as well as similar appearance of loculated appearing small fluid posterior to the aortic arch. There is background of emphysema. Similar appearance of left perihilar consolidative changes, likely post treatment changes and fibrosis. Recurrent disease is not excluded. No new consolidation. Multiple small we right upper lobe nodules similar to prior CT measuring up to 4 mm in the right apex. No pneumothorax. The  central airways remain patent. Upper Abdomen: Bilateral perinephric edema. Correlation with urinalysis recommended to exclude UTI. Musculoskeletal: Loss of subcutaneous fat and cachexia. Osteopenia with degenerative changes of the spine. No acute osseous pathology. Review of the MIP images confirms the above findings. IMPRESSION: 1. No CT evidence of pulmonary artery embolus. 2. Small right pleural effusion. 3. Similar appearance of left perihilar consolidative changes, likely post treatment changes and fibrosis. Recurrent disease is not excluded. 4. Aortic Atherosclerosis (ICD10-I70.0) and Emphysema (ICD10-J43.9). Electronically Signed   By: Angus Bark M.D.   On: 09/02/2023 17:09    EKG: Independently reviewed. Sinus tachycardia, rate 122, LVH with repolarization abnormality.   Assessment/Plan   1. Symptomatic anemia; ?hematemesis  - Presents with SOB and is found to have Hgb 6.8 (8.7 on 08/20/23); BUN is stable  - Continue bowel rest and IV PPI, hold ASA, transfuse 1 unit RBC, follow serial CBCs, follow-up on GI recommendations   2. Recurrent non-small cell lung cancer; pelvic GIST  - On maintenance treatment with [facility]  under the care of Dr. Marguerita Shih    3. COPD - Not in exacerbation  - Continue DuoNebs as-needed  4. Hx of CVA  - Hold ASA for now    DVT prophylaxis: SCDs  Code Status: Full  Level of Care: Level of care: Telemetry Family Communication: None present   Disposition Plan:  Patient is from: home  Anticipated d/c is to: home Anticipated d/c date is: 09/04/23  Patient currently: Pending GI consultation, stable H&H  Consults called: GI  Admission status: Inpatient     Walton Guppy, MD [facility]  09/02/2023, 8:09 PM

## 2023-09-02 NOTE — Telephone Encounter (Addendum)
 The patient is currently at [facility] ED with a hemoglobin of 6.8 and oxygen saturation at 89% on room air. He reports vomiting "spurts" of blood. A transfer to [facility] or [facility] is being arranged for further care.  The patient's wife requested that his appointments for next week be canceled. However, per Dr. Marguerita Shih, the recommendation is to leave the appointments as scheduled for now.

## 2023-09-02 NOTE — Progress Notes (Signed)
 Facility requesting transfer: [facility] Requesting Provider: Dr. Timm Foot Reason for transfer: Blood transfusion, GI eval  Jesse [name] is a 73 y.o. male with PMH significant for lung cancer C/o vomiting, some blood was noted .Jesse [name] Hb 6.8 Hemodynamically stable Needs blood transfusion Needs GI evaluation.  [facility] was paged from ED. IV Protonix  given  Accepted to telemetry bed   Please note that [facility] will assume the care once patient arrives to the hospital. Prior to that, please reach out to the ED provider on site for any medical need.   Signed, Hoyt Macleod, MD [facility] 09/02/2023

## 2023-09-02 NOTE — ED Notes (Signed)
 [facility] at bedside to transport pt to [facility]

## 2023-09-03 ENCOUNTER — Encounter (HOSPITAL_COMMUNITY)
Admission: EM | Disposition: A | Discharge status: Home / Self Care | Payer: Self-pay | Source: Home / Self Care | Attending: Internal Medicine

## 2023-09-03 ENCOUNTER — Encounter (HOSPITAL_COMMUNITY): Payer: Self-pay | Admitting: Family Medicine

## 2023-09-03 ENCOUNTER — Inpatient Hospital Stay (HOSPITAL_COMMUNITY): Admitting: Certified Registered Nurse Anesthetist

## 2023-09-03 DIAGNOSIS — K31819 Angiodysplasia of stomach and duodenum without bleeding: Secondary | ICD-10-CM | POA: Diagnosis not present

## 2023-09-03 DIAGNOSIS — I2510 Atherosclerotic heart disease of native coronary artery without angina pectoris: Secondary | ICD-10-CM | POA: Diagnosis not present

## 2023-09-03 DIAGNOSIS — K222 Esophageal obstruction: Secondary | ICD-10-CM

## 2023-09-03 DIAGNOSIS — K920 Hematemesis: Secondary | ICD-10-CM

## 2023-09-03 DIAGNOSIS — Z87891 Personal history of nicotine dependence: Secondary | ICD-10-CM

## 2023-09-03 DIAGNOSIS — D62 Acute posthemorrhagic anemia: Secondary | ICD-10-CM | POA: Diagnosis not present

## 2023-09-03 DIAGNOSIS — K2289 Other specified disease of esophagus: Secondary | ICD-10-CM | POA: Diagnosis not present

## 2023-09-03 DIAGNOSIS — R634 Abnormal weight loss: Secondary | ICD-10-CM

## 2023-09-03 DIAGNOSIS — R630 Anorexia: Secondary | ICD-10-CM | POA: Diagnosis not present

## 2023-09-03 DIAGNOSIS — K449 Diaphragmatic hernia without obstruction or gangrene: Secondary | ICD-10-CM | POA: Diagnosis not present

## 2023-09-03 DIAGNOSIS — K922 Gastrointestinal hemorrhage, unspecified: Secondary | ICD-10-CM | POA: Diagnosis not present

## 2023-09-03 DIAGNOSIS — K3189 Other diseases of stomach and duodenum: Secondary | ICD-10-CM

## 2023-09-03 HISTORY — PX: ESOPHAGOGASTRODUODENOSCOPY: SHX5428

## 2023-09-03 HISTORY — PX: BIOPSY OF SKIN SUBCUTANEOUS TISSUE AND/OR MUCOUS MEMBRANE: SHX6741

## 2023-09-03 LAB — CBC
HCT: 24.2 % — ABNORMAL LOW (ref 39.0–52.0)
HCT: 29.9 % — ABNORMAL LOW (ref 39.0–52.0)
HCT: 33.5 % — ABNORMAL LOW (ref 39.0–52.0)
Hemoglobin: 7.3 g/dL — ABNORMAL LOW (ref 13.0–17.0)
Hemoglobin: 8.9 g/dL — ABNORMAL LOW (ref 13.0–17.0)
Hemoglobin: 10.9 g/dL — ABNORMAL LOW (ref 13.0–17.0)
MCH: 28.9 pg (ref 26.0–34.0)
MCH: 29.3 pg (ref 26.0–34.0)
MCH: 30.4 pg (ref 26.0–34.0)
MCHC: 30.2 g/dL (ref 30.0–36.0)
MCHC: 29.8 g/dL — ABNORMAL LOW (ref 30.0–36.0)
MCHC: 32.5 g/dL (ref 30.0–36.0)
MCV: 95.7 fL (ref 80.0–100.0)
MCV: 98.4 fL (ref 80.0–100.0)
MCV: 93.3 fL (ref 80.0–100.0)
Platelets: 132 10*3/uL — ABNORMAL LOW (ref 150–400)
Platelets: 142 10*3/uL — ABNORMAL LOW (ref 150–400)
Platelets: 190 10*3/uL (ref 150–400)
RBC: 2.53 MIL/uL — ABNORMAL LOW (ref 4.22–5.81)
RBC: 3.04 MIL/uL — ABNORMAL LOW (ref 4.22–5.81)
RBC: 3.59 MIL/uL — ABNORMAL LOW (ref 4.22–5.81)
RDW: 17.1 % — ABNORMAL HIGH (ref 11.5–15.5)
RDW: 17.2 % — ABNORMAL HIGH (ref 11.5–15.5)
RDW: 16.8 % — ABNORMAL HIGH (ref 11.5–15.5)
WBC: 5 10*3/uL (ref 4.0–10.5)
WBC: 4.7 10*3/uL (ref 4.0–10.5)
WBC: 7 10*3/uL (ref 4.0–10.5)
nRBC: 0 % (ref 0.0–0.2)
nRBC: 0 % (ref 0.0–0.2)
nRBC: 0 % (ref 0.0–0.2)

## 2023-09-03 LAB — BASIC METABOLIC PANEL WITH GFR
Anion gap: 8 (ref 5–15)
BUN: 23 mg/dL (ref 8–23)
CO2: 28 mmol/L (ref 22–32)
Calcium: 8.9 mg/dL (ref 8.9–10.3)
Chloride: 99 mmol/L (ref 98–111)
Creatinine, Ser: 0.5 mg/dL — ABNORMAL LOW (ref 0.61–1.24)
GFR, Estimated: >60 mL/min (ref >60)
Glucose, Bld: 103 mg/dL — ABNORMAL HIGH (ref 70–99)
Potassium: 3.6 mmol/L (ref 3.5–5.1)
Sodium: 135 mmol/L (ref 135–145)

## 2023-09-03 LAB — PREPARE RBC (CROSSMATCH)

## 2023-09-03 SURGERY — EGD (ESOPHAGOGASTRODUODENOSCOPY)
Anesthesia: Monitor Anesthesia Care

## 2023-09-03 MED ORDER — GABAPENTIN 100 MG PO CAPS
100.0000 mg | ORAL_CAPSULE | Freq: Every day | ORAL | Status: DC
  Administered 2023-09-03: 100 mg via ORAL
  Filled 2023-09-03: qty 1

## 2023-09-03 MED ORDER — PROPOFOL 10 MG/ML IV BOLUS
INTRAVENOUS | Status: DC | PRN
  Administered 2023-09-03: 20 mg via INTRAVENOUS
  Administered 2023-09-03: 30 mg via INTRAVENOUS

## 2023-09-03 MED ORDER — FOLIC ACID 1 MG PO TABS
1.0000 mg | ORAL_TABLET | Freq: Every day | ORAL | Status: DC
  Administered 2023-09-03 – 2023-09-04 (×2): 1 mg via ORAL
  Filled 2023-09-03 (×2): qty 1

## 2023-09-03 MED ORDER — SODIUM CHLORIDE 0.9% IV SOLUTION: Freq: Once | INTRAVENOUS | Status: AC

## 2023-09-03 MED ORDER — SODIUM CHLORIDE 0.9 % IV SOLN: INTRAVENOUS | Status: DC | PRN

## 2023-09-03 MED ORDER — PANTOPRAZOLE SODIUM 40 MG PO TBEC
40.0000 mg | DELAYED_RELEASE_TABLET | Freq: Two times a day (BID) | ORAL | Status: DC
  Administered 2023-09-03 – 2023-09-04 (×2): 40 mg via ORAL
  Filled 2023-09-03 (×2): qty 1

## 2023-09-03 MED ORDER — LIDOCAINE 2% (20 MG/ML) 5 ML SYRINGE
INTRAMUSCULAR | Status: DC | PRN
  Administered 2023-09-03: 100 mg via INTRAVENOUS

## 2023-09-03 MED ORDER — SODIUM CHLORIDE 0.9 % IV SOLN: INTRAVENOUS | Status: DC

## 2023-09-03 MED ORDER — SUCRALFATE 1 GM/10ML PO SUSP
1.0000 g | Freq: Two times a day (BID) | ORAL | Status: DC
  Administered 2023-09-03 – 2023-09-04 (×3): 1 g via ORAL
  Filled 2023-09-03 (×3): qty 10

## 2023-09-03 MED ORDER — PROPOFOL 500 MG/50ML IV EMUL
INTRAVENOUS | Status: AC
  Filled 2023-09-03: qty 100

## 2023-09-03 MED ORDER — RISAQUAD PO CAPS
2.0000 | ORAL_CAPSULE | Freq: Every day | ORAL | Status: DC
  Administered 2023-09-04: 2 via ORAL
  Filled 2023-09-03: qty 2

## 2023-09-03 MED ORDER — PROPOFOL 500 MG/50ML IV EMUL
INTRAVENOUS | Status: DC | PRN
  Administered 2023-09-03: 125 ug/kg/min via INTRAVENOUS

## 2023-09-03 NOTE — Consult Note (Addendum)
 Consultation  Referring Provider:    DO Vann  Primary Care Physician:  [name], MD Primary Gastroenterologist:   Dr. Willy Harvest      Reason for Consultation: Hematemesis/anemia             HPI:   Jesse [name] is a 73 y.o. 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    At time of admission patient describes shortness of breath yesterday morning without a cough, fever or chills, last night he had vomited after taking his medications and his wife said it looked like red blood.    Today, the patient tells me he is not really sure why he was brought in.  According to him he has been "sick before", and he does not really feel sick now.  Tells me his wife was the one who told him he needed to come in.  Apparently over the past 3 to 4 months he has been having some issues occasionally which he thinks are related to taking certain meds with "other stuff" and will wake up in the evenings and have nausea and then have 1 episode of vomiting, this has happened 1 or 2 other times, but yesterday evening woke up and was nauseous, went to the bathroom had an episode of vomiting and thought it was fine but his wife thought she saw some blood.  Along with this has been experiencing some increased shortness of breath/dyspnea on exertion.  She brought him to the ER.      Patient tells me over the past 5 to 6 weeks he has not been "eating properly", apparently does not have an appetite.  They are trying to do high-protein and he tells me it just "does not taste right".  He has noticed a weight loss of around 8 pounds over the past couple of months.  Otherwise no change in bowel habits.  No blood in his stool.  No NSAID use.    Denies fever, chills or abdominal pain.   ED course: BUN 28 on admission down to 23 this  morning, creatinine 0.55, hemoglobin 6.8--> 6.3 --> 1 unit PRBCs --> 7.3 (8.7 on 08/20/2023) and normal lactic acid, CTA chest is negative for PE, started on IV Protonix   GI history: 05/13/2023 office visit with Dr. Artis Binder that time reviewed complications of left inguinal hernia, gastrostomy tube was removed 10/03/2022 colonoscopy with anal stricture, related to prior resection of rectal GIST, diverticulosis in the sigmoid colon otherwise normal 07/04/2022 EGD done for dysphagia and malnutrition, no abnormality but PEG tube was placed  Past Medical History:  Diagnosis Date   Anemia    Cancer (HCC)    bladder cancer   COPD (chronic obstructive pulmonary disease) (HCC)    mild    DIVERTICULOSIS, COLON 01/14/2007   Qualifier: Diagnosis of  By: Rachell Budge RN, Arlena Belts    GASTRITIS, CHRONIC 06/01/2004   Qualifier: Diagnosis of  By: Tereasa Felty CMA (AAMA), Robin     GERD 01/09/2007   Qualifier: Diagnosis of  By: Ena Harries RN, Ellen     Heart murmur    as a child; no murmur heard 12/14/19   History of radiation therapy    Mediastinum- 05/15/22-06/28/22-Dr. Retta Caster   HYPERLIPIDEMIA 01/09/2007   Qualifier: Diagnosis of  By: Georgie Kiss  Hypertension    Lung mass    left upper nodule   Macular degeneration    right eye   NEOPLASM, MALIGNANT, BLADDER, HX OF 2002   Qualifier: Diagnosis of  By: Tereasa Felty CMA (AAMA), Robin  / no chemo or radiation   OSTEOARTHRITIS 01/14/2007   Qualifier: Diagnosis of  By: Rachell Budge, RN, Arlena Belts - knees   Rectal mass    Reflux esophagitis 06/01/2004   Qualifier: Diagnosis of  By: Tereasa Felty CMA Nonie Beady), Robin     Stroke [facility])    2024   TOBACCO USER 02/16/2009   Qualifier: Diagnosis of  By: Minnette Amato  MD, Ronie Cohen    VITAMIN D  DEFICIENCY 06/03/2007   Qualifier: Diagnosis of  By: Minnette Amato  MD, Ronie Cohen     Past Surgical History:  Procedure Laterality Date   BIOPSY  06/18/2022   Procedure: BIOPSY;  Surgeon: Annis Kinder, DO;  Location: [facility];   Service: Gastroenterology;;   BREAST BIOPSY Left 03/16/2022   US  LT RADIOACTIVE SEED LOC 03/16/2022 [facility]   BRONCHIAL BIOPSY  10/13/2019   Procedure: BRONCHIAL BIOPSIES;  Surgeon: Prudy Brownie, DO;  Location: [facility];  Service: Pulmonary;;   BRONCHIAL BIOPSY  12/04/2022   Procedure: BRONCHIAL BIOPSIES;  Surgeon: Prudy Brownie, DO;  Location: [facility];  Service: Pulmonary;;   BRONCHIAL BRUSHINGS  10/13/2019   Procedure: BRONCHIAL BRUSHINGS;  Surgeon: Prudy Brownie, DO;  [facility]: [facility];  Service: Pulmonary;;   BRONCHIAL NEEDLE ASPIRATION BIOPSY  10/13/2019   Procedure: BRONCHIAL NEEDLE ASPIRATION BIOPSIES;  Surgeon: Prudy Brownie, DO;  [facility];  Service: Pulmonary;;   BRONCHIAL NEEDLE ASPIRATION BIOPSY  04/11/2022   Procedure: BRONCHIAL NEEDLE ASPIRATION BIOPSIES;  Surgeon: Prudy Brownie, DO;  [facility]: [facility];  Service: Cardiopulmonary;;   BRONCHIAL NEEDLE ASPIRATION BIOPSY  12/04/2022   Procedure: BRONCHIAL NEEDLE ASPIRATION BIOPSIES;  Surgeon: Prudy Brownie, DO;  [facility];  Service: Pulmonary;;   BRONCHIAL WASHINGS  10/13/2019   Procedure: BRONCHIAL WASHINGS;  Surgeon: Prudy Brownie, DO;  [facility];  Service: Pulmonary;;   BRONCHIAL WASHINGS  12/04/2022   Procedure: BRONCHIAL WASHINGS;  Surgeon: Prudy Brownie, DO;  [facility];  Service: Pulmonary;;   COLONOSCOPY     multiple   DIVERTING ILEOSTOMY N/A 12/14/2021   Procedure: DIVERTING ILEOSTOMY;  Surgeon: Candyce Champagne, MD;  Location: [facility];  Service: General;  Laterality: N/A;   ENDOBRONCHIAL ULTRASOUND Bilateral 04/11/2022   Procedure: ENDOBRONCHIAL ULTRASOUND;  Surgeon: Prudy Brownie, DO;  [facility]: [facility];  Service: Cardiopulmonary;  Laterality: Bilateral;   ESOPHAGEAL DILATION  06/18/2022   Procedure: ESOPHAGEAL DILATION;  Surgeon: Annis Kinder, DO;  [facility]: [facility];  Service: Gastroenterology;;    ESOPHAGOGASTRODUODENOSCOPY (EGD) WITH PROPOFOL  N/A 06/18/2022   Procedure: ESOPHAGOGASTRODUODENOSCOPY (EGD) WITH PROPOFOL ;  Surgeon: Annis Kinder, DO;  Location: [facility];  Service: Gastroenterology;  Laterality: N/A;  Severe dysphagia/odynophagia ?radiation esophagitis versus oral candidiasis   FIDUCIAL MARKER PLACEMENT  12/04/2022   Procedure: FIDUCIAL MARKER PLACEMENT;  Surgeon: Prudy Brownie, DO;  [facility];  Service: Pulmonary;;   ILEOSTOMY CLOSURE N/A 03/22/2022   Procedure: OPEN TAKEDOWN OF LOOP ILEOSTOMY;  Surgeon: Candyce Champagne, MD;  Location: [facility];  Service: General;  Laterality: N/A;  GEN w/ERAS PATHWAY LOCAL   INTERCOSTAL NERVE BLOCK Left 12/16/2019   Procedure: INTERCOSTAL NERVE BLOCK;  Surgeon: Hilarie Lovely, MD;  [facility];  Service: Thoracic;  Laterality: Left;  IR FLUORO RM 30-60 MIN  07/02/2022   IR IMAGING GUIDED PORT INSERTION  05/21/2023   KNEE SURGERY  07/20/2008   bilat.   NASAL SEPTUM SURGERY  1995   NODE DISSECTION Left 12/16/2019   Procedure: NODE DISSECTION;  Surgeon: Hilarie Lovely, MD;  [facility];  Service: Thoracic;  Laterality: Left;   PEG PLACEMENT N/A 07/04/2022   Procedure: PERCUTANEOUS ENDOSCOPIC GASTROSTOMY (PEG) PLACEMENT;  Surgeon: Kenney Peacemaker, MD;  Location: [facility];  Service: Gastroenterology;  Laterality: N/A;   RADIOACTIVE SEED GUIDED AXILLARY SENTINEL LYMPH NODE Left 03/20/2022   Procedure: RADIOACTIVE SEED GUIDED LEFT AXILLARY SENTINEL LYMPH NODE BIOPSY;  Surgeon: Sim Dryer, MD;  [facility];  Service: General;  Laterality: Left;   RECTAL EXAM UNDER ANESTHESIA N/A 03/22/2022   Procedure: ANORECTAL EXAMINATION UNDER ANESTHESIA;  Surgeon: Candyce Champagne, MD;  Location: [facility];  Service: General;  Laterality: N/A;   TONSILLECTOMY     TRANSURETHRAL RESECTION OF BLADDER TUMOR  2002   UPPER GASTROINTESTINAL ENDOSCOPY     VIDEO BRONCHOSCOPY  04/11/2022   Procedure: VIDEO BRONCHOSCOPY;  Surgeon: Prudy Brownie, DO;  [facility]: [facility];  Service: Cardiopulmonary;;   VIDEO BRONCHOSCOPY WITH ENDOBRONCHIAL NAVIGATION N/A 10/13/2019   Procedure: VIDEO BRONCHOSCOPY WITH ENDOBRONCHIAL NAVIGATION;  Surgeon: Prudy Brownie, DO;  [facility];  Service: Pulmonary;  Laterality: N/A;   WISDOM TOOTH EXTRACTION     XI ROBOTIC ASSISTED LOWER ANTERIOR RESECTION N/A 12/14/2021   Procedure: XI ROBOTIC ASSISTED LOWER ANTERIOR ULTRA LOW RECTOSIGMOID RESECTION, COLOANAL HAND SEWN ANASTOMOSIS, AND BILATERAL TAP BLOCK;  Surgeon: Candyce Champagne, MD;  Location: [facility];  Service: General;  Laterality: N/A;    Family History  Problem Relation Age of Onset   Dementia Mother    Diabetes Father    Mental illness Father    Peripheral vascular disease Brother    Colon cancer Neg Hx    Esophageal cancer Neg Hx    Stomach cancer Neg Hx    Rectal cancer Neg Hx     Social History   Tobacco Use   Smoking status: Former    Current packs/day: 0.00    Average packs/day: 0.5 packs/day for 40.0 years (20.0 ttl pk-yrs)    Types: Cigarettes    Start date: 09/21/1979    Quit date: 09/21/2019    Years since quitting: 3.9   Smokeless tobacco: Never  Vaping Use   Vaping status: Never Used  Substance Use Topics   Alcohol use: Not Currently    Alcohol/week: 15.0 standard drinks of alcohol    Types: 15 Standard drinks or equivalent per week    Comment: wine/beer/martini   Drug use: Not Currently    Types: Marijuana    Comment: Smokes marijuana occasional, last use in 08/2019    Prior to Admission medications   Medication Sig Start Date End Date Taking? Authorizing Provider  acetaminophen  (TYLENOL ) 500 MG tablet Take 1,000 mg by mouth every 8 (eight) hours as needed for moderate pain.    [provider]  aspirin  EC 81 MG tablet Take 81 mg by mouth daily. Swallow whole.    [provider]  atorvastatin  (LIPITOR) 20 MG tablet Take 1 tablet (20 mg total) by mouth daily. 01/23/23   [name], MD  Cholecalciferol  (VITAMIN D3 PO) Take 2,000 Units by mouth daily.    [provider]  dexamethasone  (DECADRON ) 4 MG tablet PLEASE TAKE 1 TABLET TWICE A DAY THE DAY BEFORE, THE DAY  OF, AND THE DAY AFTER CHEMOTHERAPY 08/08/23   Heilingoetter, Cassandra L, PA-C  diphenoxylate -atropine  (LOMOTIL ) 2.5-0.025 MG tablet Take 2 tablets by mouth 4 (four) times daily. 08/21/23   Kenney Peacemaker, MD  ferrous sulfate  325 (65 FE) MG EC tablet Take 1 tablet (325 mg total) by mouth 2 (two) times daily. 08/01/22 08/01/23  [name], MD  folic acid  (FOLVITE ) 1 MG tablet TAKE 1 TABLET BY MOUTH EVERY DAY 08/08/23   Marlene Simas, MD  gabapentin  (NEURONTIN ) 100 MG capsule Take 1 capsule (100 mg total) by mouth at bedtime. 07/17/23   [name], MD  lidocaine  (XYLOCAINE ) 5 % ointment Apply 1 Application topically 3 (three) times daily as needed for mild pain or moderate pain. 08/15/22   Pickenpack-Cousar, Athena N, NP  lidocaine -prilocaine  (EMLA ) cream Apply 1 Application topically as needed. 05/07/23   Heilingoetter, Cassandra L, PA-C  loperamide  (IMODIUM  A-D) 2 MG tablet Take 4 mg by mouth 4 (four) times daily as needed for diarrhea or loose stools.    [provider]  mirtazapine  (REMERON  SOL-TAB) 30 MG disintegrating tablet TAKE 1 TABLET BY MOUTH EVERYDAY AT BEDTIME 08/08/23   Kenney Peacemaker, MD  Multiple Vitamins-Minerals (PRESERVISION AREDS 2) CAPS Take 1 capsule by mouth 2 (two) times daily.    [provider]  OVER THE COUNTER MEDICATION Take 1 tablet by mouth daily. PROBIOTICS    [provider]  OVER THE COUNTER MEDICATION Fiber and spice pt take every morning    [provider]  OVER THE COUNTER MEDICATION Juce pt takes every morning    [provider]  pantoprazole  (PROTONIX ) 40 MG tablet Take 1 tablet (40 mg total) by mouth 2 (two) times daily. 05/28/23   Pickenpack-Cousar, Athena N, NP  prochlorperazine  (COMPAZINE ) 10 MG tablet Take 1 tablet (10  mg total) by mouth every 6 (six) hours as needed for nausea or vomiting. 12/11/22   Marlene Simas, MD    Current Facility-Administered Medications  Medication Dose Route Frequency Provider Last Rate Last Admin   acetaminophen  (TYLENOL ) tablet 650 mg  650 mg Oral Q6H PRN Opyd, Timothy S, MD       Or   acetaminophen  (TYLENOL ) suppository 650 mg  650 mg Rectal Q6H PRN Opyd, Timothy S, MD       Chlorhexidine  Gluconate Cloth 2 % PADS 6 each  6 each Topical Daily Opyd, Timothy S, MD       ipratropium-albuterol  (DUONEB) 0.5-2.5 (3) MG/3ML nebulizer solution 3 mL  3 mL Nebulization Q4H PRN Opyd, Santana Cue, MD       lactated ringers  infusion   Intravenous Continuous Opyd, Timothy S, MD 75 mL/hr at 09/03/23 0736 Infusion Verify at 09/03/23 0736   ondansetron  (ZOFRAN ) tablet 4 mg  4 mg Oral Q6H PRN Opyd, Timothy S, MD       Or   ondansetron  (ZOFRAN ) injection 4 mg  4 mg Intravenous Q6H PRN Opyd, Santana Cue, MD       pantoprazole  (PROTONIX ) injection 40 mg  40 mg Intravenous Q12H Opyd, Timothy S, MD   40 mg at 09/03/23 0418   sodium chloride  flush (NS) 0.9 % injection 10-40 mL  10-40 mL Intracatheter PRN Opyd, Timothy S, MD       sodium chloride  flush (NS) 0.9 % injection 3 mL  3 mL Intravenous Q12H Opyd, Santana Cue, MD   3 mL at 09/02/23 2056    Allergies as of 09/02/2023   (No Known Allergies)  Review of Systems:    Constitutional: No weight loss, fever or chills Skin: No rash  Cardiovascular: No chest pain Respiratory: See HPI Gastrointestinal: See HPI and otherwise negative Genitourinary: No dysuria  Neurological: No headache, dizziness or syncope Musculoskeletal: No new muscle or joint pain Hematologic: No bruising Psychiatric: No history of depression or anxiety    Physical Exam:  Vital signs in last 24 hours: Temp:  [97.5 F (36.4 C)-98.1 F (36.7 C)] 97.7 F (36.5 C) (05/20 0650) Pulse Rate:  [85-124] 88 (05/20 0650) Resp:  [15-21] 18 (05/20 0145) BP: (104-121)/(62-73)  121/69 (05/20 0650) SpO2:  [94 %-98 %] 96 % (05/20 0650) Weight:  [56.7 kg] 56.7 kg (05/19 1930) Last BM Date : 09/03/23 General:   Pleasant thin, cachectic appearing Caucasian male appears to be in NAD, Well developed, Well nourished, alert and cooperative Head:  Normocephalic and atraumatic. Eyes:   PEERL, EOMI. No icterus. Conjunctiva pink. Ears:  Normal auditory acuity. Neck:  Supple Throat: Oral cavity and pharynx without inflammation, swelling or lesion.  Lungs: Respirations even and unlabored. Lungs clear to auscultation bilaterally.   No wheezes, crackles, or rhonchi.  Heart: Normal S1, S2. No MRG. Regular rate and rhythm. No peripheral edema, cyanosis or pallor.  Abdomen:  Soft, nondistended, nontender. No rebound or guarding. Normal bowel sounds. No appreciable masses or hepatomegaly. Rectal:  Not performed.  Msk:  Symmetrical without gross deformities. Peripheral pulses intact.  Extremities:  Without edema, no deformity or joint abnormality. Neurologic:  Alert and  oriented x4;  grossly normal neurologically. Skin:   Dry and intact without significant lesions or rashes. Psychiatric: Demonstrates good judgement and reason without abnormal affect or behaviors.   LAB RESULTS: Recent Labs    09/02/23 1408 09/02/23 2103 09/03/23 0405  WBC 6.1 4.9 5.0  HGB 6.8* 6.3* 7.3*  HCT 22.1* 20.8* 24.2*  PLT 132* 134* 132*   BMET Recent Labs    09/02/23 1408 09/03/23 0405  NA 133* 135  K 3.9 3.6  CL 96* 99  CO2 26 28  GLUCOSE 156* 103*  BUN 28* 23  CREATININE 0.55* 0.50*  CALCIUM  9.3 8.9    STUDIES: CT Angio Chest PE W and/or Wo Contrast Result Date: 09/02/2023 CLINICAL DATA:  Concern for pulmonary embolism. Metastatic lung cancer. EXAM: CT ANGIOGRAPHY CHEST WITH CONTRAST TECHNIQUE: Multidetector CT imaging of the chest was performed using the standard protocol during bolus administration of intravenous contrast. Multiplanar CT image reconstructions and MIPs were obtained  to evaluate the vascular anatomy. RADIATION DOSE REDUCTION: This exam was performed according to the departmental dose-optimization program which includes automated exposure control, adjustment of the mA and/or kV according to patient size and/or use of iterative reconstruction technique. CONTRAST:  60mL OMNIPAQUE  IOHEXOL  350 MG/ML SOLN COMPARISON:  Chest CT dated 07/18/2023. FINDINGS: Cardiovascular: There is no cardiomegaly or pericardial effusion. Advanced 3 vessel coronary vascular calcification. Moderate atherosclerotic calcification of the thoracic aorta. No aneurysmal dilatation or dissection. The origins of the great vessels of the aortic arch are patent. Right-sided Port-A-Cath with tip in the right atrium. No pulmonary artery embolus identified. Mediastinum/Nodes: No obvious adenopathy. Evaluation however is limited due to infiltration of the hilar and mediastinal fat planes. Mild diffuse circumferential thickening of the esophagus may be related to underdistention. No mediastinal fluid collection. Lungs/Pleura: Small right pleural effusion as well as similar appearance of loculated appearing small fluid posterior to the aortic arch. There is background of emphysema. Similar appearance of left perihilar consolidative changes, likely post treatment  changes and fibrosis. Recurrent disease is not excluded. No new consolidation. Multiple small we right upper lobe nodules similar to prior CT measuring up to 4 mm in the right apex. No pneumothorax. The central airways remain patent. Upper Abdomen: Bilateral perinephric edema. Correlation with urinalysis recommended to exclude UTI. Musculoskeletal: Loss of subcutaneous fat and cachexia. Osteopenia with degenerative changes of the spine. No acute osseous pathology. Review of the MIP images confirms the above findings. IMPRESSION: 1. No CT evidence of pulmonary artery embolus. 2. Small right pleural effusion. 3. Similar appearance of left perihilar consolidative  changes, likely post treatment changes and fibrosis. Recurrent disease is not excluded. 4. Aortic Atherosclerosis (ICD10-I70.0) and Emphysema (ICD10-J43.9). Electronically Signed   By: Angus Bark M.D.   On: 09/02/2023 17:09     Impression / Plan:   Impression: 1.  Symptomatic anemia: With description of hematemesis, presented with shortness of breath and found to have weekly hemoglobin of 6.8 (8.7 on 08/20/2023)--> 1 unit PRBCs--> 7.3, BUN initially elevated but now normal, started on IV PPI twice daily at admission; consider upper GI bleed most likely 2.  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  COPD 4.  History of CVA: Aspirin  on hold 5. Weight loss: 8 pounds in the past 2 months with a decrease in appetite  Plan: 1.  Plan for EGD this afternoon at 1:00 with Dr. Cherryl Corona.  Did discuss risks, benefits, limitations and alternatives and the patient agrees to proceed. 2.  Patient received 1 further unit of PRBCs per request from anesthesiology 3.  Patient be n.p.o. until after time of procedure. 4.  Agree with holding Aspirin  5.  Continue IV Pantoprazole  40 mg twice daily 6.  Please await further recommendations later today.  Thank your for your kind consultation, we will continue to follow along.  Kathy Parker [facility]  09/03/2023, 8:54 AM

## 2023-09-03 NOTE — Anesthesia Preprocedure Evaluation (Addendum)
 Anesthesia Evaluation  Patient identified by MRN, date of birth, ID band Patient awake    Reviewed: Allergy & Precautions, H&P , NPO status , Patient's Chart, lab work & pertinent test results  Airway Mallampati: II  TM Distance: >3 FB Neck ROM: Full    Dental no notable dental hx. (+) Teeth Intact, Dental Advisory Given   Pulmonary COPD, former smoker   Pulmonary exam normal breath sounds clear to auscultation       Cardiovascular hypertension, + CAD   Rhythm:Regular Rate:Normal     Neuro/Psych CVA  negative psych ROS   GI/Hepatic Neg liver ROS, hiatal hernia,GERD  Medicated,,GIB   Endo/Other  negative endocrine ROS    Renal/GU Renal disease  negative genitourinary   Musculoskeletal  (+) Arthritis , Osteoarthritis,    Abdominal   Peds  Hematology  (+) Blood dyscrasia, anemia Lab Results      Component                Value               Date                      WBC                      5.0                 09/03/2023                HGB                      7.3 (L)             09/03/2023                HCT                      24.2 (L)            09/03/2023                MCV                      95.7                09/03/2023                PLT                      132 (L)             09/03/2023              Anesthesia Other Findings 2U PRBC pre-op  Reproductive/Obstetrics negative OB ROS                             Anesthesia Physical Anesthesia Plan  ASA: 3  Anesthesia Plan: MAC   Post-op Pain Management: Minimal or no pain anticipated   Induction: Intravenous  PONV Risk Score and Plan: 1 and Propofol  infusion and Treatment may vary due to age or medical condition  Airway Management Planned: Simple Face Mask and Nasal Cannula  Additional Equipment: None  Intra-op Plan:   Post-operative Plan:   Informed Consent: I have reviewed the patients History and Physical, chart,  labs and discussed the procedure including the risks, benefits and alternatives  for the proposed anesthesia with the patient or authorized representative who has indicated his/her understanding and acceptance.     Dental advisory given  Plan Discussed with: CRNA  Anesthesia Plan Comments:        Anesthesia Quick Evaluation

## 2023-09-03 NOTE — Transfer of Care (Signed)
 Immediate Anesthesia Transfer of Care Note  Patient: Jesse [name]  Procedure(s) Performed: EGD (ESOPHAGOGASTRODUODENOSCOPY)  Patient Location: Endoscopy Unit  Anesthesia Type:MAC  Level of Consciousness: awake, alert , and oriented  Airway & Oxygen Therapy: Patient Spontanous Breathing and Patient connected to nasal cannula oxygen  Post-op Assessment: Report given to RN and Post -op Vital signs reviewed and stable  Post vital signs: Reviewed and stable  Last Vitals:  Vitals Value Taken Time  BP 91/55 1434  Temp 97 1434  Pulse 85 09/03/23 1434  Resp 18 09/03/23 1435  SpO2 95 % 09/03/23 1434  Vitals shown include unfiled device data.  Last Pain:  Vitals:   09/03/23 1255  TempSrc: Temporal  PainSc: 0-No pain         Complications: No notable events documented.

## 2023-09-03 NOTE — TOC Initial Note (Signed)
 Transition of Care [facility]) - Initial/Assessment Note    Patient Details  Name: Jesse [name] MRN: [id number] Date of Birth: Dec 05, 1950  Transition of Care [facility] CM/SW Contact:    Ruben Corolla, RN Phone Number: 09/03/2023, 2:16 PM  Clinical Narrative: d/c plan home.                  Expected Discharge Plan: Home/Self Care Barriers to Discharge: Continued Medical Work up   Patient Goals and CMS Choice Patient states their goals for this hospitalization and ongoing recovery are:: Home CMS Medicare.gov Compare Post Acute Care list provided to:: Patient Choice offered to / list presented to : Patient [place] ownership interest in [facility].provided to:: Patient    Expected Discharge Plan and Services                                              Prior Living Arrangements/Services                       Activities of Daily Living   ADL Screening (condition at time of admission) Independently performs ADLs?: Yes (appropriate for developmental age) Is the patient deaf or have difficulty hearing?: No Does the patient have difficulty seeing, even when wearing glasses/contacts?: No Does the patient have difficulty concentrating, remembering, or making decisions?: No  Permission Sought/Granted                  Emotional Assessment              Admission diagnosis:  Acute GI bleeding [K92.2] Symptomatic anemia [D64.9] Patient Active Problem List   Diagnosis Date Noted   Acute GI bleeding 09/02/2023   Symptomatic anemia 09/02/2023   History of stroke 09/02/2023   COPD (chronic obstructive pulmonary disease) (HCC)    Port-A-Cath in place 05/28/2023   Recurrent non-small cell lung cancer (HCC) 03/05/2023   Acute ischemic stroke, likely embolic 07/02/2022   Esophageal dysphagia 06/18/2022   Esophageal stricture 06/18/2022   Hiatal hernia 06/18/2022   Radiation-induced esophagitis 06/18/2022   Pancytopenia due to chemotherapy  06/07/2022   Protein-calorie malnutrition, severe 06/07/2022   AKI (acute kidney injury) (HCC) 06/05/2022   Hyponatremia 06/05/2022   Acute metabolic encephalopathy 06/05/2022   Acute respiratory failure with hypoxia (HCC) 06/05/2022   Acute on chronic diarrhea 06/05/2022   Weight loss 05/28/2022   Malignant neoplasm of lung (HCC) 04/30/2022   Adenopathy 04/06/2022   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   Encounter for antineoplastic chemotherapy 01/28/2020   Adenocarcinoma of left lung, stage 2 (HCC) 12/31/2019   Goals of care, counseling/discussion 12/31/2019   S/P lobectomy of lung 12/16/2019   Lung nodule 10/13/2019   Essential hypertension 09/28/2016   Coronary artery calcification 09/28/2016   Impaired glucose tolerance 09/28/2016   Vitamin D  deficiency 06/03/2007   Diverticulosis of colon 01/14/2007   Dyslipidemia 01/09/2007   GERD 01/09/2007   REFLUX ESOPHAGITIS 06/01/2004   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   PCP:  [name], MD Pharmacy:   [facility] [phone] - [place], [place] - [address] [place] [address] Phone: [phone] Fax: [phone]     Social Drivers of Health (SDOH) Social History: SDOH Screenings   Food Insecurity: No Food Insecurity (09/02/2023)  Housing: Low  Risk  (09/02/2023)  Transportation Needs: No Transportation Needs (09/02/2023)  Utilities: Not At Risk (09/02/2023)  Alcohol Screen: Low Risk  (06/09/2023)  Depression (PHQ2-9): Low Risk  (07/17/2023)  Financial Resource Strain: Low Risk  (06/09/2023)  Physical Activity: Sufficiently Active (06/09/2023)  Social Connections: Moderately Integrated (09/02/2023)  Stress: No Stress Concern Present (06/09/2023)  Tobacco Use: Medium Risk (09/03/2023)   SDOH Interventions:     Readmission Risk Interventions    06/29/2022    4:48 PM 06/11/2022    5:42 PM  Readmission Risk Prevention Plan   Transportation Screening Complete Complete  PCP or Specialist Appt within 3-5 Days  Complete  HRI or Home Care Consult  Complete  Social Work Consult for Recovery Care Planning/Counseling  Complete  Palliative Care Screening  Not Applicable  Medication Review Oceanographer) Complete Complete  PCP or Specialist appointment within 3-5 days of discharge Complete   HRI or Home Care Consult Complete   SW Recovery Care/Counseling Consult Complete   Palliative Care Screening Not Applicable   Skilled Nursing Facility Complete

## 2023-09-03 NOTE — H&P (Signed)
 GASTROENTEROLOGY PROCEDURE H&P NOTE   Primary Care Physician: [name], MD  HPI: Jesse [name] is a 73 y.o. male who presents for EGD for evaluation of Hematemesis/CGE and ABLA.  Past Medical History:  Diagnosis Date   Anemia    Cancer (HCC)    bladder cancer   COPD (chronic obstructive pulmonary disease) (HCC)    mild    DIVERTICULOSIS, COLON 01/14/2007   Qualifier: Diagnosis of  By: Rachell Budge RN, Arlena Belts    GASTRITIS, CHRONIC 06/01/2004   Qualifier: Diagnosis of  By: Tereasa Felty CMA (AAMA), Robin     GERD 01/09/2007   Qualifier: Diagnosis of  By: Ena Harries, RN, Ellen     Heart murmur    as a child; no murmur heard 12/14/19   History of radiation therapy    Mediastinum- 05/15/22-06/28/22-Dr. Retta Caster   HYPERLIPIDEMIA 01/09/2007   Qualifier: Diagnosis of  By: Ena Harries RN, Willetta Harpin     Hypertension    Lung mass    left upper nodule   Macular degeneration    right eye   NEOPLASM, MALIGNANT, BLADDER, HX OF 2002   Qualifier: Diagnosis of  By: Tereasa Felty CMA (AAMA), Robin  / no chemo or radiation   OSTEOARTHRITIS 01/14/2007   Qualifier: Diagnosis of  By: Rachell Budge, RN, Arlena Belts - knees   Rectal mass    Reflux esophagitis 06/01/2004   Qualifier: Diagnosis of  By: Tereasa Felty CMA Nonie Beady), Robin     Stroke [facility])    2024   TOBACCO USER 02/16/2009   Qualifier: Diagnosis of  By: Minnette Amato  MD, Ronie Cohen    VITAMIN D  DEFICIENCY 06/03/2007   Qualifier: Diagnosis of  By: Minnette Amato  MD, Ronie Cohen    Past Surgical History:  Procedure Laterality Date   BIOPSY  06/18/2022   Procedure: BIOPSY;  Surgeon: Annis Kinder, DO;  Location: [facility];  Service: Gastroenterology;;   BREAST BIOPSY Left 03/16/2022   US  LT RADIOACTIVE SEED LOC 03/16/2022 [facility]   BRONCHIAL BIOPSY  10/13/2019   Procedure: BRONCHIAL BIOPSIES;  Surgeon: Prudy Brownie, DO;  [facility];  Service: Pulmonary;;   BRONCHIAL BIOPSY  12/04/2022   Procedure: BRONCHIAL BIOPSIES;  Surgeon: Prudy Brownie, DO;  [facility];  Service: Pulmonary;;   BRONCHIAL BRUSHINGS  10/13/2019   Procedure: BRONCHIAL BRUSHINGS;  Surgeon: Prudy Brownie, DO;  [facility];  Service: Pulmonary;;   BRONCHIAL NEEDLE ASPIRATION BIOPSY  10/13/2019   Procedure: BRONCHIAL NEEDLE ASPIRATION BIOPSIES;  Surgeon: Prudy Brownie, DO;  [facility];  Service: Pulmonary;;   BRONCHIAL NEEDLE ASPIRATION BIOPSY  04/11/2022   Procedure: BRONCHIAL NEEDLE ASPIRATION BIOPSIES;  Surgeon: Prudy Brownie, DO;  [facility];  Service: Cardiopulmonary;;   BRONCHIAL NEEDLE ASPIRATION BIOPSY  12/04/2022   Procedure: BRONCHIAL NEEDLE ASPIRATION BIOPSIES;  Surgeon: Prudy Brownie, DO;  [facility];  Service: Pulmonary;;   BRONCHIAL WASHINGS  10/13/2019   Procedure: BRONCHIAL WASHINGS;  Surgeon: Prudy Brownie, DO;  [facility];  Service: Pulmonary;;   BRONCHIAL WASHINGS  12/04/2022   Procedure: BRONCHIAL WASHINGS;  Surgeon: Prudy Brownie, DO;  [facility];  Service: Pulmonary;;   COLONOSCOPY     multiple   DIVERTING ILEOSTOMY N/A 12/14/2021   Procedure: DIVERTING ILEOSTOMY;  Surgeon: Candyce Champagne, MD;  [facility]: [facility];  Service: General;  Laterality: N/A;   ENDOBRONCHIAL ULTRASOUND Bilateral 04/11/2022   Procedure: ENDOBRONCHIAL ULTRASOUND;  Surgeon: Prudy Brownie, DO;  [facility];  Service: Cardiopulmonary;  Laterality: Bilateral;   ESOPHAGEAL DILATION  06/18/2022   Procedure: ESOPHAGEAL DILATION;  Surgeon: Annis Kinder, DO;  [facility];  Service: Gastroenterology;;   ESOPHAGOGASTRODUODENOSCOPY (EGD) WITH PROPOFOL  N/A 06/18/2022   Procedure: ESOPHAGOGASTRODUODENOSCOPY (EGD) WITH PROPOFOL ;  Surgeon: Annis Kinder, DO;  [facility];  Service: Gastroenterology;  Laterality: N/A;  Severe dysphagia/odynophagia ?radiation esophagitis versus oral candidiasis   FIDUCIAL MARKER PLACEMENT  12/04/2022   Procedure:  FIDUCIAL MARKER PLACEMENT;  Surgeon: Prudy Brownie, DO;  [facility];  Service: Pulmonary;;   ILEOSTOMY CLOSURE N/A 03/22/2022   Procedure: OPEN TAKEDOWN OF LOOP ILEOSTOMY;  Surgeon: Candyce Champagne, MD;  [facility];  Service: General;  Laterality: N/A;  GEN w/ERAS PATHWAY LOCAL   INTERCOSTAL NERVE BLOCK Left 12/16/2019   Procedure: INTERCOSTAL NERVE BLOCK;  Surgeon: Hilarie Lovely, MD;  [facility];  Service: Thoracic;  Laterality: Left;   IR FLUORO RM 30-60 MIN  07/02/2022   IR IMAGING GUIDED PORT INSERTION  05/21/2023   KNEE SURGERY  07/20/2008   bilat.   NASAL SEPTUM SURGERY  1995   NODE DISSECTION Left 12/16/2019   Procedure: NODE DISSECTION;  Surgeon: Hilarie Lovely, MD;  [facility];  Service: Thoracic;  Laterality: Left;   PEG PLACEMENT N/A 07/04/2022   Procedure: PERCUTANEOUS ENDOSCOPIC GASTROSTOMY (PEG) PLACEMENT;  Surgeon: Kenney Peacemaker, MD;  [facility];  Service: Gastroenterology;  Laterality: N/A;   RADIOACTIVE SEED GUIDED AXILLARY SENTINEL LYMPH NODE Left 03/20/2022   Procedure: RADIOACTIVE SEED GUIDED LEFT AXILLARY SENTINEL LYMPH NODE BIOPSY;  Surgeon: Sim Dryer, MD;  [facility];  Service: General;  Laterality: Left;   RECTAL EXAM UNDER ANESTHESIA N/A 03/22/2022   Procedure: ANORECTAL EXAMINATION UNDER ANESTHESIA;  Surgeon: Candyce Champagne, MD;  [facility];  Service: General;  Laterality: N/A;   TONSILLECTOMY     TRANSURETHRAL RESECTION OF BLADDER TUMOR  2002   UPPER GASTROINTESTINAL ENDOSCOPY     VIDEO BRONCHOSCOPY  04/11/2022   Procedure: VIDEO BRONCHOSCOPY;  Surgeon: Prudy Brownie, DO;  [facility];  Service: Cardiopulmonary;;   VIDEO BRONCHOSCOPY WITH ENDOBRONCHIAL NAVIGATION N/A 10/13/2019   Procedure: VIDEO BRONCHOSCOPY WITH ENDOBRONCHIAL NAVIGATION;  Surgeon: Prudy Brownie, DO;  [facility];  Service: Pulmonary;  Laterality: N/A;   WISDOM TOOTH EXTRACTION     XI ROBOTIC ASSISTED LOWER  ANTERIOR RESECTION N/A 12/14/2021   Procedure: XI ROBOTIC ASSISTED LOWER ANTERIOR ULTRA LOW RECTOSIGMOID RESECTION, COLOANAL HAND SEWN ANASTOMOSIS, AND BILATERAL TAP BLOCK;  Surgeon: Candyce Champagne, MD;  Location: [facility];  Service: General;  Laterality: N/A;   Current Facility-Administered Medications  Medication Dose Route Frequency Provider Last Rate Last Admin   0.9 %  sodium chloride  infusion   Intravenous Continuous Mansouraty, Marcelo Ickes Jr., MD       Evette Hoes Hold] acetaminophen  (TYLENOL ) tablet 650 mg  650 mg Oral Q6H PRN Opyd, Timothy S, MD       Or   Evette Hoes Hold] acetaminophen  (TYLENOL ) suppository 650 mg  650 mg Rectal Q6H PRN Opyd, Timothy S, MD       [MAR Hold] Chlorhexidine  Gluconate Cloth 2 % PADS 6 each  6 each Topical Daily Opyd, Timothy S, MD   6 each at 09/03/23 0933   [MAR Hold] folic acid  (FOLVITE ) tablet 1 mg  1 mg Oral Daily Vann, Jessica U, DO   1 mg at 09/03/23 1200   [MAR Hold] gabapentin  (NEURONTIN ) capsule 100 mg  100 mg  Oral QHS Vann, Jessica U, DO       [MAR Hold] ipratropium-albuterol  (DUONEB) 0.5-2.5 (3) MG/3ML nebulizer solution 3 mL  3 mL Nebulization Q4H PRN Opyd, Timothy S, MD       [MAR Hold] ondansetron  (ZOFRAN ) tablet 4 mg  4 mg Oral Q6H PRN Opyd, Timothy S, MD       Or   Evette Hoes Hold] ondansetron  (ZOFRAN ) injection 4 mg  4 mg Intravenous Q6H PRN Opyd, Timothy S, MD       [MAR Hold] pantoprazole  (PROTONIX ) injection 40 mg  40 mg Intravenous Q12H Opyd, Santana Cue, MD   40 mg at 09/03/23 0418   [MAR Hold] sodium chloride  flush (NS) 0.9 % injection 10-40 mL  10-40 mL Intracatheter PRN Opyd, Santana Cue, MD       [MAR Hold] sodium chloride  flush (NS) 0.9 % injection 3 mL  3 mL Intravenous Q12H Opyd, Timothy S, MD   3 mL at 09/02/23 2056   Facility-Administered Medications Ordered in Other Encounters  Medication Dose Route Frequency Provider Last Rate Last Admin   0.9 %  sodium chloride  infusion   Intravenous Continuous PRN Rochell Chroman, CRNA   New Bag at 09/03/23 1250     Current Facility-Administered Medications:    0.9 %  sodium chloride  infusion, , Intravenous, Continuous, Mansouraty, Albino Alu., MD   Evette Hoes Hold] acetaminophen  (TYLENOL ) tablet 650 mg, 650 mg, Oral, Q6H PRN **OR** [MAR Hold] acetaminophen  (TYLENOL ) suppository 650 mg, 650 mg, Rectal, Q6H PRN, Opyd, Santana Cue, MD   [MAR Hold] Chlorhexidine  Gluconate Cloth 2 % PADS 6 each, 6 each, Topical, Daily, Opyd, Santana Cue, MD, 6 each at 09/03/23 0933   [facility] Hold] folic acid  (FOLVITE ) tablet 1 mg, 1 mg, Oral, Daily, Vann, Jessica U, DO, 1 mg at 09/03/23 1200   [MAR Hold] gabapentin  (NEURONTIN ) capsule 100 mg, 100 mg, Oral, QHS, Vann, Jessica U, DO   [MAR Hold] ipratropium-albuterol  (DUONEB) 0.5-2.5 (3) MG/3ML nebulizer solution 3 mL, 3 mL, Nebulization, Q4H PRN, Opyd, Timothy S, MD   [MAR Hold] ondansetron  (ZOFRAN ) tablet 4 mg, 4 mg, Oral, Q6H PRN **OR** [MAR Hold] ondansetron  (ZOFRAN ) injection 4 mg, 4 mg, Intravenous, Q6H PRN, Opyd, Santana Cue, MD   [MAR Hold] pantoprazole  (PROTONIX ) injection 40 mg, 40 mg, Intravenous, Q12H, Opyd, Santana Cue, MD, 40 mg at 09/03/23 0418   [MAR Hold] sodium chloride  flush (NS) 0.9 % injection 10-40 mL, 10-40 mL, Intracatheter, PRN, Opyd, Timothy S, MD   [MAR Hold] sodium chloride  flush (NS) 0.9 % injection 3 mL, 3 mL, Intravenous, Q12H, Opyd, Santana Cue, MD, 3 mL at 09/02/23 2056  Facility-Administered Medications Ordered in Other Encounters:    0.9 %  sodium chloride  infusion, , Intravenous, Continuous PRN, Rochell Chroman, CRNA, New Bag at 09/03/23 1250 No Known Allergies Family History  Problem Relation Age of Onset   Dementia Mother    Diabetes Father    Mental illness Father    Peripheral vascular disease Brother    Colon cancer Neg Hx    Esophageal cancer Neg Hx    Stomach cancer Neg Hx    Rectal cancer Neg Hx    Social History   Socioeconomic History   Marital status: Married    Spouse name: [name]   Number of children: 3   Years of education: Not  on file   Highest education level: Some college, no degree  Occupational History    Comment: semi retired   Tobacco Use   Smoking status:  Former    Current packs/day: 0.00    Average packs/day: 0.5 packs/day for 40.0 years (20.0 ttl pk-yrs)    Types: Cigarettes    Start date: 09/21/1979    Quit date: 09/21/2019    Years since quitting: 3.9   Smokeless tobacco: Never  Vaping Use   Vaping status: Never Used  Substance and Sexual Activity   Alcohol use: Not Currently    Alcohol/week: 15.0 standard drinks of alcohol    Types: 15 Standard drinks or equivalent per week    Comment: wine/beer/martini   Drug use: Not Currently    Types: Marijuana    Comment: Smokes marijuana occasional, last use in 08/2019   Sexual activity: Not Currently  Other Topics Concern   Not on file  Social History Narrative   Married   3 children: 3 grandchildren; 3 great grandchildren   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   Enjoys yard work and Systems analyst   Social Drivers of Corporate investment banker Strain: Low Risk  (06/09/2023)   Overall Financial Resource Strain (CARDIA)    Difficulty of Paying Living Expenses: Not hard at all  Food Insecurity: No Food Insecurity (09/02/2023)   Hunger Vital Sign    Worried About Running Out of Food in the Last Year: Never true    Ran Out of Food in the Last Year: Never true  Transportation Needs: No Transportation Needs (09/02/2023)   PRAPARE - Administrator, Civil Service (Medical): No    Lack of Transportation (Non-Medical): No  Physical Activity: Sufficiently Active (06/09/2023)   Exercise Vital Sign    Days of Exercise per Week: 5 days    Minutes of Exercise per Session: 30 min  Stress: No Stress Concern Present (06/09/2023)   [employer] of Occupational Health - Occupational Stress Questionnaire    Feeling of Stress : Not at all  Social Connections: Moderately Integrated (09/02/2023)   Social Connection and Isolation Panel [NHANES]     Frequency of Communication with Friends and Family: More than three times a week    Frequency of Social Gatherings with Friends and Family: Twice a week    Attends Religious Services: 1 to 4 times per year    Active Member of [employer] or Organizations: No    Attends Banker Meetings: Never    Marital Status: Married  Catering manager Violence: Not At Risk (09/02/2023)   Humiliation, Afraid, Rape, and Kick questionnaire    Fear of Current or Ex-Partner: No    Emotionally Abused: No    Physically Abused: No    Sexually Abused: No    Physical Exam: Today's Vitals   09/03/23 1026 09/03/23 1042 09/03/23 1048 09/03/23 1255  BP: 118/68  120/69 (!) 151/74  Pulse: 89  92 100  Resp: 20  16 12   Temp: 98.2 F (36.8 C)  98.5 F (36.9 C) 97.9 F (36.6 C)  TempSrc: Oral  Oral Temporal  SpO2: 98%  98% 99%  Weight:      Height:      PainSc:  0-No pain  0-No pain   Body mass index is 19.01 kg/m. GEN: NAD EYE: Sclerae anicteric ENT: MMM CV: Non-tachycardic GI: Soft, NT/ND NEURO:  Alert & Oriented x 3  Lab Results: Recent Labs    09/02/23 2103 09/03/23 0405 09/03/23 0820  WBC 4.9 5.0 4.7  HGB 6.3* 7.3* 8.9*  HCT 20.8* 24.2* 29.9*  PLT 134* 132* 142*   BMET Recent Labs  09/02/23 1408 09/03/23 0405  NA 133* 135  K 3.9 3.6  CL 96* 99  CO2 26 28  GLUCOSE 156* 103*  BUN 28* 23  CREATININE 0.55* 0.50*  CALCIUM  9.3 8.9   LFT No results for input(s): "PROT", "ALBUMIN ", "AST", "ALT", "ALKPHOS", "BILITOT", "BILIDIR", "IBILI" in the last 72 hours. PT/INR No results for input(s): "LABPROT", "INR" in the last 72 hours.   Impression / Plan: This is a 73 y.o.male who presents for EGD for evaluation of Hematemesis/CGE and ABLA.  The risks and benefits of endoscopic evaluation/treatment were discussed with the patient and/or family; these include but are not limited to the risk of perforation, infection, bleeding, missed lesions, lack of diagnosis, severe illness  requiring hospitalization, as well as anesthesia and sedation related illnesses.  The patient's history has been reviewed, patient examined, no change in status, and deemed stable for procedure.  The patient and/or family is agreeable to proceed.    Yong Henle, MD [facility]oscopy Office # [phone]

## 2023-09-03 NOTE — Anesthesia Postprocedure Evaluation (Signed)
 Anesthesia Post Note  Patient: Jesse [name]  Procedure(s) Performed: EGD (ESOPHAGOGASTRODUODENOSCOPY) BIOPSY, SKIN, SUBCUTANEOUS TISSUE, OR MUCOUS MEMBRANE     Patient location during evaluation: Endoscopy Anesthesia Type: MAC Level of consciousness: awake and alert Pain management: pain level controlled Vital Signs Assessment: post-procedure vital signs reviewed and stable Respiratory status: spontaneous breathing, nonlabored ventilation and respiratory function stable Cardiovascular status: stable and blood pressure returned to baseline Postop Assessment: no apparent nausea or vomiting Anesthetic complications: no  There were no known notable events for this encounter.  Last Vitals:  Vitals:   09/03/23 1440 09/03/23 1450  BP: 96/66 135/73  Pulse: 87 83  Resp: 17 20  Temp: 36.6 C   SpO2: 93% 93%    Last Pain:  Vitals:   09/03/23 1450  TempSrc:   PainSc: 0-No pain                 Kyesha Balla,W. EDMOND

## 2023-09-03 NOTE — Progress Notes (Signed)
 PROGRESS NOTE    Jesse [name]  UJW:[id number] DOB: [birth year] DOA: 09/02/2023 PCP: [name], MD    Brief Narrative:  Jesse [name] is a 73 y.o. 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   The patient complains of SOB since this morning without cough, fever, or chills. Last night, he vomited after taking his medications. His wife said that it looked like red blood. The patient is not sure if there was blood in the vomit but has not experienced any nausea since then. He denies abdominal pain, melena, hematochezia, or recent NSAID use.      Assessment and Plan: Symptomatic anemia; ?hematemesis  - Presents with SOB and is found to have Hgb 6.8 (8.7 on 08/20/23) -GI consult: Plan for EGD on 5/20 - Getting a total of 2 units of PRBC - Trend hemoglobin    Recurrent non-small cell lung cancer; pelvic GIST  - On maintenance treatment with [facility]  under the care of Dr. Marguerita Shih      COPD - Not in exacerbation  - Continue DuoNebs as-needed    Hx of CVA  - Hold ASA for now    DVT prophylaxis: SCDs Start: 09/02/23 2008    Code Status: Full Code   Disposition Plan:  Level of care: Telemetry Status is: Inpatient     Consultants:  GI   Subjective: Says he feels like he has been hit by a bus  Objective: Vitals:   09/03/23 0145 09/03/23 0650 09/03/23 1026 09/03/23 1048  BP: 109/73 121/69 118/68 120/69  Pulse: 85 88 89 92  Resp: 18  20 16   Temp: 98 F (36.7 C) 97.7 F (36.5 C) 98.2 F (36.8 C) 98.5 F (36.9 C)  TempSrc: Oral Oral Oral Oral  SpO2: 98% 96% 98% 98%  Weight:      Height:        Intake/Output Summary (Last 24 hours) at 09/03/2023 1146 Last data filed at 09/03/2023 0736 Gross per 24 hour  Intake 959.7 ml  Output 200 ml  Net 759.7 ml   Filed Weights   09/02/23 1930  Weight: 56.7 kg     Examination:   General: Appearance:    Thin male in no acute distress     Lungs:     Clear to auscultation bilaterally, respirations unlabored  Heart:    Normal heart rate.    MS:   All extremities are intact.    Neurologic:   Awake, alert, oriented x 3. No apparent focal neurological           defect.        Data Reviewed: I have personally reviewed following labs and imaging studies  CBC: Recent Labs  Lab 09/02/23 1408 09/02/23 2103 09/03/23 0405  WBC 6.1 4.9 5.0  HGB 6.8* 6.3* 7.3*  HCT 22.1* 20.8* 24.2*  MCV 93.6 95.4 95.7  PLT 132* 134* 132*   Basic Metabolic Panel: Recent Labs  Lab 09/02/23 1408 09/03/23 0405  NA 133* 135  K 3.9 3.6  CL 96* 99  CO2 26 28  GLUCOSE 156* 103*  BUN 28* 23  CREATININE 0.55* 0.50*  CALCIUM  9.3 8.9   GFR: Estimated Creatinine Clearance: 65 mL/min (A) (by C-G formula based on SCr of 0.5 mg/dL (L)). Liver Function Tests: No results for input(s): "AST", "ALT", "ALKPHOS", "BILITOT", "PROT", "ALBUMIN " in the last 168 hours. No results for  input(s): "LIPASE", "AMYLASE" in the last 168 hours. No results for input(s): "AMMONIA" in the last 168 hours. Coagulation Profile: No results for input(s): "INR", "PROTIME" in the last 168 hours. Cardiac Enzymes: No results for input(s): "CKTOTAL", "CKMB", "CKMBINDEX", "TROPONINI" in the last 168 hours. BNP (last 3 results) No results for input(s): "PROBNP" in the last 8760 hours. HbA1C: No results for input(s): "HGBA1C" in the last 72 hours. CBG: No results for input(s): "GLUCAP" in the last 168 hours. Lipid Profile: No results for input(s): "CHOL", "HDL", "LDLCALC", "TRIG", "CHOLHDL", "LDLDIRECT" in the last 72 hours. Thyroid  Function Tests: No results for input(s): "TSH", "T4TOTAL", "FREET4", "T3FREE", "THYROIDAB" in the last 72 hours. Anemia Panel: No results for input(s): "VITAMINB12", "FOLATE", "FERRITIN", "TIBC", "IRON", "RETICCTPCT" in the last 72 hours. Sepsis Labs: Recent  Labs  Lab 09/02/23 1415  LATICACIDVEN 0.9    No results found for this or any previous visit (from the past 240 hours).       Radiology Studies: CT Angio Chest PE W and/or Wo Contrast Result Date: 09/02/2023 CLINICAL DATA:  Concern for pulmonary embolism. Metastatic lung cancer. EXAM: CT ANGIOGRAPHY CHEST WITH CONTRAST TECHNIQUE: Multidetector CT imaging of the chest was performed using the standard protocol during bolus administration of intravenous contrast. Multiplanar CT image reconstructions and MIPs were obtained to evaluate the vascular anatomy. RADIATION DOSE REDUCTION: This exam was performed according to the departmental dose-optimization program which includes automated exposure control, adjustment of the mA and/or kV according to patient size and/or use of iterative reconstruction technique. CONTRAST:  60mL OMNIPAQUE  IOHEXOL  350 MG/ML SOLN COMPARISON:  Chest CT dated 07/18/2023. FINDINGS: Cardiovascular: There is no cardiomegaly or pericardial effusion. Advanced 3 vessel coronary vascular calcification. Moderate atherosclerotic calcification of the thoracic aorta. No aneurysmal dilatation or dissection. The origins of the great vessels of the aortic arch are patent. Right-sided Port-A-Cath with tip in the right atrium. No pulmonary artery embolus identified. Mediastinum/Nodes: No obvious adenopathy. Evaluation however is limited due to infiltration of the hilar and mediastinal fat planes. Mild diffuse circumferential thickening of the esophagus may be related to underdistention. No mediastinal fluid collection. Lungs/Pleura: Small right pleural effusion as well as similar appearance of loculated appearing small fluid posterior to the aortic arch. There is background of emphysema. Similar appearance of left perihilar consolidative changes, likely post treatment changes and fibrosis. Recurrent disease is not excluded. No new consolidation. Multiple small we right upper lobe nodules similar to  prior CT measuring up to 4 mm in the right apex. No pneumothorax. The central airways remain patent. Upper Abdomen: Bilateral perinephric edema. Correlation with urinalysis recommended to exclude UTI. Musculoskeletal: Loss of subcutaneous fat and cachexia. Osteopenia with degenerative changes of the spine. No acute osseous pathology. Review of the MIP images confirms the above findings. IMPRESSION: 1. No CT evidence of pulmonary artery embolus. 2. Small right pleural effusion. 3. Similar appearance of left perihilar consolidative changes, likely post treatment changes and fibrosis. Recurrent disease is not excluded. 4. Aortic Atherosclerosis (ICD10-I70.0) and Emphysema (ICD10-J43.9). Electronically Signed   By: Angus Bark M.D.   On: 09/02/2023 17:09        Scheduled Meds:  Chlorhexidine  Gluconate Cloth  6 each Topical Daily   pantoprazole  (PROTONIX ) IV  40 mg Intravenous Q12H   sodium chloride  flush  3 mL Intravenous Q12H   Continuous Infusions:   LOS: 1 day    Time spent: 45 minutes spent on chart review, discussion with nursing staff, consultants, updating family and interview/physical exam; more than  50% of that time was spent in counseling and/or coordination of care.    Enrigue Harvard, DO [facility] Available via Epic secure chat 7am-7pm After these hours, please refer to coverage provider listed on [email or web address] 09/03/2023, 11:46 AM

## 2023-09-03 NOTE — Op Note (Signed)
 [facility] Patient Name: Jesse [name] Procedure Date: 09/03/2023 MRN: [id number] Attending MD: Yong Henle , MD, [id number] Date of Birth: 1950/12/24 CSN: [id number] Age: 73 Admit Type: Inpatient Procedure:                Upper GI endoscopy Indications:              Acute post hemorrhagic anemia, Coffee-ground                            emesis, Hematochezia Providers:                Suzann Ernst, RN, Joline Ned, Technician,                            Yong Henle, MD Referring MD:              Medicines:                Monitored Anesthesia Care Complications:            No immediate complications. Estimated Blood Loss:     Estimated blood loss was minimal. Procedure:                Pre-Anesthesia Assessment:                           - Prior to the procedure, a History and Physical                            was performed, and patient medications and                            allergies were reviewed. The patient's tolerance of                            previous anesthesia was also reviewed. The risks                            and benefits of the procedure and the sedation                            options and risks were discussed with the patient.                            All questions were answered, and informed consent                            was obtained. Prior Anticoagulants: The patient has                            taken no anticoagulant or antiplatelet agents                            except for aspirin . ASA Grade Assessment: III - A                            patient  with severe systemic disease. After                            reviewing the risks and benefits, the patient was                            deemed in satisfactory condition to undergo the                            procedure.                           After obtaining informed consent, the endoscope was                            passed under direct vision. Throughout  the                            procedure, the patient's blood pressure, pulse, and                            oxygen saturations were monitored continuously. The                            [id number]-H190 ([id number]) Olympus endoscope was introduced                            through the mouth, and advanced to the second part                            of duodenum. The upper GI endoscopy was                            accomplished without difficulty. The patient                            tolerated the procedure. Scope In: Scope Out: Findings:      Segmental mild mucosal changes characterized by congestion and altered       texture were found in the mid esophagus. Biopsies were taken with a cold       forceps for histology.      A non-obstructing Schatzki ring was found at the gastroesophageal       junction.      No gross lesions were noted in the entire esophagus.      The Z-line was irregular and was found 42 cm from the incisors.      A 2 cm hiatal hernia was present.      Three angioectasias with no bleeding were found in the cardia and in the       gastric body. Fulguration to ablate the lesion to prevent bleeding by       argon plasma was successful.      Patchy mildly erythematous mucosa without bleeding was found in the       entire examined stomach. Biopsies were taken with a cold forceps for       histology and Helicobacter pylori testing.  A single small angiodysplastic lesion with typical arborization was       found in the duodenal bulb. Fulguration to ablate the lesion to prevent       bleeding by argon plasma was successful.      No other gross lesions were noted in the duodenal bulb, in the first       portion of the duodenum and in the second portion of the duodenum.      The major papilla was normal. Impression:               - Congested, texture changed mucosa in the                            esophagus. Biopsied.                           - Non-obstructing Schatzki  ring.                           - No gross lesions in the entire esophagus.                           - Z-line irregular, 42 cm from the incisors.                           - 2 cm hiatal hernia.                           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                            Ablated with APC.                           - Erythematous mucosa in the stomach. Biopsied.                           - Duodenal bulb AVM. Ablated with APC.                           - No other gross lesions in the duodenal bulb, in                            the first portion of the duodenum and in the second                            portion of the duodenum.                           - Normal major papilla. Moderate Sedation:      Not Applicable - Patient had care per Anesthesia. Recommendation:           - The patient will be observed post-procedure,                            until all discharge criteria are met.                           -  Discharge patient to home.                           - Patient has a contact number available for                            emergencies. The signs and symptoms of potential                            delayed complications were discussed with the                            patient. Return to normal activities tomorrow.                            Written discharge instructions were provided to the                            patient.                           - Advance diet as tolerated.                           - Continue twice daily PPI.                           - Add Carafate  1 g slurry twice daily for 2 weeks.                            Then may stop.                           - Observe patient's clinical course.                           - Trend hemoglobin/hematocrit.                           - Follow-up pathology. I was informed that                            unfortunately due to technical error, both the                            esophagus  and stomach biopsies were placed in the                            same jar.                           - He has brown stool on rectal today. I think we                            monitor him closely into tomorrow. If he continues  to have issues of transfusion dependent anemia,                            will consider colonoscopy +/- video capsule                            endoscopy as inpatient. If he is doing well into                            tomorrow with stable hemoglobin then I think he can                            probably be discharged with close monitoring and                            oral iron supplementation and be assessed as an                            outpatient for the role of video capsule endoscopy                            +/- colonoscopy.                           - The findings and recommendations were discussed                            with the patient.                           - The findings and recommendations were discussed                            with the patient's family. Procedure Code(s):        --- Professional ---                           [phone], Esophagogastroduodenoscopy, flexible,                            transoral; with biopsy, single or multiple Diagnosis Code(s):        --- Professional ---                           K22.89, Other specified disease of esophagus                           K31.89, Other diseases of stomach and duodenum                           K22.2, Esophageal obstruction                           K44.9, Diaphragmatic hernia without obstruction or  gangrene                           K31.819, Angiodysplasia of stomach and duodenum                            without bleeding                           D62, Acute posthemorrhagic anemia                           K92.0, Hematemesis                           K92.1, Melena (includes Hematochezia) CPT copyright 2022 American  Medical Association. All rights reserved. The codes documented in this report are preliminary and upon coder review may  be revised to meet current compliance requirements. Yong Henle, MD 09/03/2023 2:47:40 PM Number of Addenda: 0

## 2023-09-04 ENCOUNTER — Encounter (HOSPITAL_COMMUNITY): Payer: Self-pay | Admitting: Gastroenterology

## 2023-09-04 ENCOUNTER — Encounter: Payer: Self-pay | Admitting: Internal Medicine

## 2023-09-04 DIAGNOSIS — K920 Hematemesis: Secondary | ICD-10-CM | POA: Diagnosis not present

## 2023-09-04 DIAGNOSIS — D649 Anemia, unspecified: Secondary | ICD-10-CM

## 2023-09-04 DIAGNOSIS — D62 Acute posthemorrhagic anemia: Secondary | ICD-10-CM | POA: Diagnosis not present

## 2023-09-04 DIAGNOSIS — K922 Gastrointestinal hemorrhage, unspecified: Secondary | ICD-10-CM | POA: Diagnosis not present

## 2023-09-04 DIAGNOSIS — K31819 Angiodysplasia of stomach and duodenum without bleeding: Secondary | ICD-10-CM

## 2023-09-04 DIAGNOSIS — K5520 Angiodysplasia of colon without hemorrhage: Secondary | ICD-10-CM

## 2023-09-04 LAB — TYPE AND SCREEN
ABO/RH(D): O POS
Antibody Screen: NEGATIVE
Unit division: 0
Unit division: 0

## 2023-09-04 LAB — IRON AND TIBC
Iron: 26 ug/dL — ABNORMAL LOW (ref 45–182)
Saturation Ratios: 12 % — ABNORMAL LOW (ref 17.9–39.5)
TIBC: 214 ug/dL — ABNORMAL LOW (ref 250–450)
UIBC: 188 ug/dL

## 2023-09-04 LAB — BASIC METABOLIC PANEL WITH GFR
Anion gap: 8 (ref 5–15)
BUN: 18 mg/dL (ref 8–23)
CO2: 26 mmol/L (ref 22–32)
Calcium: 8.7 mg/dL — ABNORMAL LOW (ref 8.9–10.3)
Chloride: 99 mmol/L (ref 98–111)
Creatinine, Ser: 0.5 mg/dL — ABNORMAL LOW (ref 0.61–1.24)
GFR, Estimated: >60 mL/min (ref >60)
Glucose, Bld: 105 mg/dL — ABNORMAL HIGH (ref 70–99)
Potassium: 3.5 mmol/L (ref 3.5–5.1)
Sodium: 133 mmol/L — ABNORMAL LOW (ref 135–145)

## 2023-09-04 LAB — BPAM RBC
Blood Product Expiration Date: 202506182359
Blood Product Expiration Date: 202506182359
ISSUE DATE / TIME: 202505192247
ISSUE DATE / TIME: 202505201024
Unit Type and Rh: 5100
Unit Type and Rh: 5100

## 2023-09-04 LAB — SURGICAL PATHOLOGY

## 2023-09-04 LAB — CBC
HCT: 28.2 % — ABNORMAL LOW (ref 39.0–52.0)
Hemoglobin: 9.2 g/dL — ABNORMAL LOW (ref 13.0–17.0)
MCH: 30.5 pg (ref 26.0–34.0)
MCHC: 32.6 g/dL (ref 30.0–36.0)
MCV: 93.4 fL (ref 80.0–100.0)
Platelets: 169 10*3/uL (ref 150–400)
RBC: 3.02 MIL/uL — ABNORMAL LOW (ref 4.22–5.81)
RDW: 16.8 % — ABNORMAL HIGH (ref 11.5–15.5)
WBC: 6.8 10*3/uL (ref 4.0–10.5)
nRBC: 0 % (ref 0.0–0.2)

## 2023-09-04 LAB — FERRITIN: Ferritin: 1754 ng/mL — ABNORMAL HIGH (ref 24–336)

## 2023-09-04 MED ORDER — FERROUS SULFATE 325 (65 FE) MG PO TBEC: 325.0000 mg | DELAYED_RELEASE_TABLET | Freq: Every day | ORAL | 0 refills | Status: AC

## 2023-09-04 MED ORDER — HEPARIN SOD (PORK) LOCK FLUSH 100 UNIT/ML IV SOLN
500.0000 [IU] | INTRAVENOUS | Status: AC | PRN
  Administered 2023-09-04: 500 [IU]
  Filled 2023-09-04: qty 5

## 2023-09-04 MED ORDER — SUCRALFATE 1 GM/10ML PO SUSP: 1.0000 g | Freq: Two times a day (BID) | ORAL | 0 refills | Status: DC

## 2023-09-04 MED ORDER — PANTOPRAZOLE SODIUM 40 MG PO TBEC: 40.0000 mg | DELAYED_RELEASE_TABLET | Freq: Two times a day (BID) | ORAL | 0 refills | Status: DC

## 2023-09-04 NOTE — TOC Transition Note (Signed)
 Transition of Care [facility]) - Discharge Note   Patient Details  Name: Jesse [name] MRN: [id number] Date of Birth: May 07, 1950  [facility] CM/SW Contact:  Ruben Corolla, RN Phone Number: 09/04/2023, 10:53 AM   Clinical Narrative:  d/c home No CM needs.     Final next level of care: Home/Self Care Barriers to Discharge: No Barriers Identified   Patient Goals and CMS Choice Patient states their goals for this hospitalization and ongoing recovery are:: Home CMS Medicare.gov Compare Post Acute Care list provided to:: Patient Choice offered to / list presented to : Patient [place] ownership interest in [facility].provided to:: Patient    Discharge Placement                       Discharge Plan and Services Additional resources added to the After Visit Summary for                                       Social Drivers of Health (SDOH) Interventions SDOH Screenings   Food Insecurity: No Food Insecurity (09/02/2023)  Housing: Low Risk  (09/02/2023)  Transportation Needs: No Transportation Needs (09/02/2023)  Utilities: Not At Risk (09/02/2023)  Alcohol Screen: Low Risk  (06/09/2023)  Depression (PHQ2-9): Low Risk  (07/17/2023)  Financial Resource Strain: Low Risk  (06/09/2023)  Physical Activity: Sufficiently Active (06/09/2023)  Social Connections: Moderately Integrated (09/02/2023)  Stress: No Stress Concern Present (06/09/2023)  Tobacco Use: Medium Risk (09/03/2023)     Readmission Risk Interventions    06/29/2022    4:48 PM 06/11/2022    5:42 PM  Readmission Risk Prevention Plan  Transportation Screening Complete Complete  PCP or Specialist Appt within 3-5 Days  Complete  HRI or Home Care Consult  Complete  Social Work Consult for Recovery Care Planning/Counseling  Complete  Palliative Care Screening  Not Applicable  Medication Review Oceanographer) Complete Complete  PCP or Specialist appointment within 3-5 days of discharge  Complete   HRI or Home Care Consult Complete   SW Recovery Care/Counseling Consult Complete   Palliative Care Screening Not Applicable   Skilled Nursing Facility Complete

## 2023-09-04 NOTE — Discharge Summary (Addendum)
 Physician Discharge Summary  Jesse [name] ZOX:[id number] DOB: [birth year] DOA: 09/02/2023  PCP: [name], MD  Admit date: 09/02/2023 Discharge date: 09/04/2023  Admitted From: Home Disposition:  Home  Recommendations for Outpatient Follow-up:  Follow up with PCP in 1-2 weeks Please obtain BMP/CBC in one week your next doctors visit.  PPI BID, and carafate  for 2 weeks   Discharge Condition: Stable CODE STATUS: Full  Diet recommendation: Cardiac  Brief/Interim Summary: Brief Narrative:  73 y.o. 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   Patient was admitted for symptomatic anemia baseline hemoglobin 8.7, admission hemoglobin 6.8, required 2 units of PRBC.  Underwent endoscopy on 5/20 which showed congested esophagus, Schatzki's ring, nonbleeding angiectasia ablated with APC, erythematous stomach, duodenal bulb AVM.  Biopsies were done.  GI recommended PPI twice daily and adding Carafate  twice daily for 2 weeks.  Pathology will be followed up outpatient.  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  Underwent endoscopy on 5/20 which showed congested esophagus, Schatzki's ring, nonbleeding angiectasia ablated with APC, erythematous stomach, duodenal bulb AVM.   Biopsies were done.  GI recommended PPI twice daily and adding Carafate  twice daily for 2 weeks.  Pathology will be followed up outpatient.    Recurrent non-small cell lung cancer; pelvic GIST  - On maintenance treatment with Alimta  under the care of Dr. Marguerita Shih      COPD As needed dilators    Hx of CVA  Will resume aspirin  upon discharge   DVT prophylaxis: SCDs Start: 09/02/23 2008    Code Status: Full Code Family Communication:   Status is: Inpatient Discharge today    Subjective: Feeling well, wishing to go home.   Examination:  General exam: Appears calm and comfortable  Respiratory system: Clear to auscultation. Respiratory effort normal. Cardiovascular system: S1 & S2 heard, RRR. No JVD, murmurs, rubs, gallops or clicks. No pedal edema. Gastrointestinal system: Abdomen is nondistended, soft and nontender. No organomegaly or masses felt. Normal bowel sounds heard. Central nervous system: Alert and oriented. No focal neurological deficits. Extremities: Symmetric 5 x 5 power. Skin: No rashes, lesions or ulcers Psychiatry: Judgement and insight appear normal. Mood & affect appropriate.    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3 C) 97.7 F (36.5 C)  98.4 F (36.9 C) 97.9 F (36.6 C)  TempSrc: Oral Oral Oral Oral  SpO2: 96% 97% 95% 98%  Weight:      Height:          Discharge Instructions   Allergies as of 09/04/2023   No Known Allergies      Medication List     TAKE these medications    acetaminophen  500 MG tablet Commonly known as: TYLENOL  Take 1,000 mg by mouth every 8 (eight) hours as needed for moderate pain.   aspirin  EC 81 MG tablet Take 81 mg by mouth daily. Swallow whole.   atorvastatin  20 MG tablet Commonly known as: LIPITOR Take 1 tablet (20 mg total) by mouth daily.   cyanocobalamin  500 MCG tablet Commonly known as: VITAMIN B12 Take 500 mcg by mouth daily.   dexamethasone  4 MG tablet Commonly known as: DECADRON  PLEASE TAKE 1 TABLET TWICE A DAY THE DAY BEFORE, THE DAY OF, AND THE DAY AFTER CHEMOTHERAPY What changed: See the new instructions.   diphenoxylate -atropine  2.5-0.025 MG tablet Commonly known as: LOMOTIL  Take 2 tablets by mouth 4 (four) times daily.   ferrous sulfate  325 (65 FE) MG EC tablet Take 1 tablet (325 mg total) by mouth daily with breakfast. What changed: when to take this   folic acid  1 MG tablet Commonly known as: FOLVITE  TAKE 1 TABLET BY MOUTH EVERY DAY   gabapentin  100 MG capsule Commonly known as: NEURONTIN  Take 1 capsule (100 mg total) by mouth at bedtime.   lactobacillus acidophilus Tabs tablet Take 2 tablets by mouth daily at 12 noon.   lidocaine  5 % ointment Commonly known as: XYLOCAINE  Apply 1 Application topically 3 (three) times daily as needed for mild pain or moderate pain.   mirtazapine  30 MG disintegrating tablet Commonly known as: REMERON  SOL-TAB TAKE 1 TABLET BY MOUTH EVERYDAY AT BEDTIME What changed: See the new instructions.   pantoprazole  40 MG tablet Commonly known as: PROTONIX  Take 1 tablet (40 mg total) by mouth 2 (two) times daily before a meal. What changed: when to take this   PreserVision AREDS 2 Caps Take 1 capsule by mouth 2  (two) times daily.   sucralfate  1 GM/10ML suspension Commonly known as: CARAFATE  Take 10 mLs (1 g total) by mouth 2 (two) times daily for 14 days.   VITAMIN D3 PO Take 2,000 Units by mouth daily.        Follow-up Information     [name], MD Follow up in 1 week(s).   Specialty: Family Medicine Contact information: [address] [place] [address] [phone]                No Known Allergies  You were cared for by a hospitalist during your hospital stay. If you have any questions about your discharge medications or the care you received while you were in the hospital after you are discharged, you can call the unit and asked to speak with the hospitalist on call if the hospitalist that took care of you is not available. Once you are discharged, your primary care physician will handle any further medical issues. Please note that no refills for any discharge medications will be authorized once you are discharged, as it is imperative that you  return to your primary care physician (or establish a relationship with a primary care physician if you do not have one) for your aftercare needs so that they can reassess your need for medications and monitor your lab values.  You were cared for by a hospitalist during your hospital stay. If you have any questions about your discharge medications or the care you received while you were in the hospital after you are discharged, you can call the unit and asked to speak with the hospitalist on call if the hospitalist that took care of you is not available. Once you are discharged, your primary care physician will handle any further medical issues. Please note that NO REFILLS for any discharge medications will be authorized once you are discharged, as it is imperative that you return to your primary care physician (or establish a relationship with a primary care physician if you do not have one) for your aftercare needs so that  they can reassess your need for medications and monitor your lab values.  Please request your Prim.MD to go over all Hospital Tests and Procedure/Radiological results at the follow up, please get all Hospital records sent to your Prim MD by signing hospital release before you go home.  Get CBC, CMP, 2 view Chest X ray checked  by Primary MD during your next visit or SNF MD in 5-7 days ( we routinely change or add medications that can affect your baseline labs and fluid status, therefore we recommend that you get the mentioned basic workup next visit with your PCP, your PCP may decide not to get them or add new tests based on their clinical decision)  On your next visit with your primary care physician please Get Medicines reviewed and adjusted.  If you experience worsening of your admission symptoms, develop shortness of breath, life threatening emergency, suicidal or homicidal thoughts you must seek medical attention immediately by calling 911 or calling your MD immediately  if symptoms less severe.  You Must read complete instructions/literature along with all the possible adverse reactions/side effects for all the Medicines you take and that have been prescribed to you. Take any new Medicines after you have completely understood and accpet all the possible adverse reactions/side effects.   Do not drive, operate heavy machinery, perform activities at heights, swimming or participation in water  activities or provide baby sitting services if your were admitted for syncope or siezures until you have seen by Primary MD or a Neurologist and advised to do so again.  Do not drive when taking Pain medications.   Procedures/Studies: CT Angio Chest PE W and/or Wo Contrast Result Date: 09/02/2023 CLINICAL DATA:  Concern for pulmonary embolism. Metastatic lung cancer. EXAM: CT ANGIOGRAPHY CHEST WITH CONTRAST TECHNIQUE: Multidetector CT imaging of the chest was performed using the standard protocol during  bolus administration of intravenous contrast. Multiplanar CT image reconstructions and MIPs were obtained to evaluate the vascular anatomy. RADIATION DOSE REDUCTION: This exam was performed according to the departmental dose-optimization program which includes automated exposure control, adjustment of the mA and/or kV according to patient size and/or use of iterative reconstruction technique. CONTRAST:  60mL OMNIPAQUE  IOHEXOL  350 MG/ML SOLN COMPARISON:  Chest CT dated 07/18/2023. FINDINGS: Cardiovascular: There is no cardiomegaly or pericardial effusion. Advanced 3 vessel coronary vascular calcification. Moderate atherosclerotic calcification of the thoracic aorta. No aneurysmal dilatation or dissection. The origins of the great vessels of the aortic arch are patent. Right-sided Port-A-Cath with tip in the right atrium. No pulmonary artery embolus  identified. Mediastinum/Nodes: No obvious adenopathy. Evaluation however is limited due to infiltration of the hilar and mediastinal fat planes. Mild diffuse circumferential thickening of the esophagus may be related to underdistention. No mediastinal fluid collection. Lungs/Pleura: Small right pleural effusion as well as similar appearance of loculated appearing small fluid posterior to the aortic arch. There is background of emphysema. Similar appearance of left perihilar consolidative changes, likely post treatment changes and fibrosis. Recurrent disease is not excluded. No new consolidation. Multiple small we right upper lobe nodules similar to prior CT measuring up to 4 mm in the right apex. No pneumothorax. The central airways remain patent. Upper Abdomen: Bilateral perinephric edema. Correlation with urinalysis recommended to exclude UTI. Musculoskeletal: Loss of subcutaneous fat and cachexia. Osteopenia with degenerative changes of the spine. No acute osseous pathology. Review of the MIP images confirms the above findings. IMPRESSION: 1. No CT evidence of pulmonary  artery embolus. 2. Small right pleural effusion. 3. Similar appearance of left perihilar consolidative changes, likely post treatment changes and fibrosis. Recurrent disease is not excluded. 4. Aortic Atherosclerosis (ICD10-I70.0) and Emphysema (ICD10-J43.9). Electronically Signed   By: Angus Bark M.D.   On: 09/02/2023 17:09     The results of significant diagnostics from this hospitalization (including imaging, microbiology, ancillary and laboratory) are listed below for reference.     Microbiology: No results found for this or any previous visit (from the past 240 hours).   Labs: BNP (last 3 results) No results for input(s): "BNP" in the last 8760 hours. Basic Metabolic Panel: Recent Labs  Lab 09/02/23 1408 09/03/23 0405 09/04/23 0311  NA 133* 135 133*  K 3.9 3.6 3.5  CL 96* 99 99  CO2 26 28 26   GLUCOSE 156* 103* 105*  BUN 28* 23 18  CREATININE 0.55* 0.50* 0.50*  CALCIUM  9.3 8.9 8.7*   Liver Function Tests: No results for input(s): "AST", "ALT", "ALKPHOS", "BILITOT", "PROT", "ALBUMIN " in the last 168 hours. No results for input(s): "LIPASE", "AMYLASE" in the last 168 hours. No results for input(s): "AMMONIA" in the last 168 hours. CBC: Recent Labs  Lab 09/02/23 2103 09/03/23 0405 09/03/23 0820 09/03/23 1659 09/04/23 0311  WBC 4.9 5.0 4.7 7.0 6.8  HGB 6.3* 7.3* 8.9* 10.9* 9.2*  HCT 20.8* 24.2* 29.9* 33.5* 28.2*  MCV 95.4 95.7 98.4 93.3 93.4  PLT 134* 132* 142* 190 169   Cardiac Enzymes: No results for input(s): "CKTOTAL", "CKMB", "CKMBINDEX", "TROPONINI" in the last 168 hours. BNP: Invalid input(s): "POCBNP" CBG: No results for input(s): "GLUCAP" in the last 168 hours. D-Dimer No results for input(s): "DDIMER" in the last 72 hours. Hgb A1c No results for input(s): "HGBA1C" in the last 72 hours. Lipid Profile No results for input(s): "CHOL", "HDL", "LDLCALC", "TRIG", "CHOLHDL", "LDLDIRECT" in the last 72 hours. Thyroid  function studies No results for  input(s): "TSH", "T4TOTAL", "T3FREE", "THYROIDAB" in the last 72 hours.  Invalid input(s): "FREET3" Anemia work up Recent Labs    09/04/23 0311  FERRITIN 1,754*  TIBC 214*  IRON 26*   Urinalysis    Component Value Date/Time   COLORURINE YELLOW 08/06/2022 1557   APPEARANCEUR CLEAR 08/06/2022 1557   LABSPEC 1.024 08/06/2022 1557   PHURINE 7.5 08/06/2022 1557   GLUCOSEU NEGATIVE 08/06/2022 1557   GLUCOSEU NEGATIVE 03/23/2010 0755   HGBUR LARGE (A) 08/06/2022 1557   HGBUR trace-lysed 02/09/2009 0931   BILIRUBINUR NEGATIVE 08/06/2022 1557   BILIRUBINUR n 05/26/2015 1031   KETONESUR NEGATIVE 08/06/2022 1557   PROTEINUR TRACE (A) 08/06/2022 1557  UROBILINOGEN 0.2 05/26/2015 1031   UROBILINOGEN 0.2 03/23/2010 0755   NITRITE NEGATIVE 08/06/2022 1557   LEUKOCYTESUR NEGATIVE 08/06/2022 1557   Sepsis Labs Recent Labs  Lab 09/03/23 0405 09/03/23 0820 09/03/23 1659 09/04/23 0311  WBC 5.0 4.7 7.0 6.8   Microbiology No results found for this or any previous visit (from the past 240 hours).   Time coordinating discharge:  I have spent 35 minutes face to face with the patient and on the ward discussing the patients care, assessment, plan and disposition with other care givers. >50% of the time was devoted counseling the patient about the risks and benefits of treatment/Discharge disposition and coordinating care.   SIGNED:   Maggie Schooner, MD  [facility] 09/04/2023, 12:09 PM   If 7PM-7AM, please contact night-coverage

## 2023-09-04 NOTE — Telephone Encounter (Signed)
 Received call from Lindaann Requena with pt on speaker stating pt released from hospital & they spoke with Diane/Dr Marguerita Shih about delaying his treatment for couple of weeks & it was suggested that they wait couple of days to see how he feels. They report that he still wants to hold treatment.  Discussed with Cassie & suggested pt keep lab & Cassie appt since hgb was low & had to have transfusions.  They were in agreement but wanted to cancel chemo appt. Cassie suggested to keep treatment appt just in case he needs blood or something else.  Informed pt & [name].

## 2023-09-04 NOTE — Hospital Course (Addendum)
 Brief Narrative:  73 y.o. 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   Patient was admitted for symptomatic anemia baseline hemoglobin 8.7, admission hemoglobin 6.8, required 2 units of PRBC.  Underwent endoscopy on 5/20 which showed congested esophagus, Schatzki's ring, nonbleeding angiectasia ablated with APC, erythematous stomach, duodenal bulb AVM.  Biopsies were done.  GI recommended PPI twice daily and adding Carafate  twice daily for 2 weeks.  Pathology will be followed up outpatient.  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  Underwent endoscopy on 5/20 which showed congested esophagus, Schatzki's ring, nonbleeding angiectasia ablated with APC, erythematous stomach, duodenal bulb AVM.  Biopsies were done.  GI recommended PPI twice daily and adding Carafate  twice daily for 2 weeks.  Pathology will be followed up outpatient.    Recurrent non-small cell lung cancer; pelvic GIST  - On maintenance treatment with Alimta  under the care of Dr. Marguerita Shih      COPD As needed dilators    Hx of CVA  Will resume aspirin  upon discharge   DVT prophylaxis: SCDs Start: 09/02/23 2008    Code Status: Full Code Family Communication:   Status is: Inpatient Discharge  today    Subjective: Feeling well, wishing to go home.   Examination:  General exam: Appears calm and comfortable  Respiratory system: Clear to auscultation. Respiratory effort normal. Cardiovascular system: S1 & S2 heard, RRR. No JVD, murmurs, rubs, gallops or clicks. No pedal edema. Gastrointestinal system: Abdomen is nondistended, soft and nontender. No organomegaly or masses felt. Normal bowel sounds heard. Central nervous system: Alert and oriented. No focal neurological deficits. Extremities: Symmetric 5 x 5 power. Skin: No rashes, lesions or ulcers Psychiatry: Judgement and insight appear normal. Mood & affect appropriate.

## 2023-09-04 NOTE — Progress Notes (Addendum)
Patient discharged: Home with family  Via: Wheelchair   Discharge paperwork given: to patient and family  Reviewed with teach back   telemetry disconnected  Belongings given to patient   

## 2023-09-04 NOTE — Progress Notes (Signed)
 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  No real questions.  He is ready to go home.   Objective   Vital signs in last 24 hours: Temp:  [97.7 F (36.5 C)-99.2 F (37.3 C)] 97.9 F (36.6 C) (05/21 0733) Pulse Rate:  [82-100] 86 (05/21 0733) Resp:  [12-22] 17 (05/21 0733) BP: (91-151)/(55-88) 124/70 (05/21 0733) SpO2:  [93 %-99 %] 98 % (05/21 0733) Last BM Date : 09/04/23 General: Thin, cachectic appearing white male in NAD Heart:  Regular rate and rhythm; no murmurs Lungs: Respirations even and unlabored, lungs CTA bilaterally Abdomen:  Soft, nontender and nondistended. Normal bowel sounds. Psych:  Cooperative. Normal mood and affect.  Intake/Output from previous day: 05/20 0701 - 05/21 0700 In: 735.7 [I.V.:282.3; Blood:453.3] Out: -  Intake/Output this shift: Total I/O In: 120 [P.O.:120] Out: -   Lab Results: Recent Labs    09/03/23 0820 09/03/23 1659 09/04/23 0311  WBC 4.7 7.0 6.8  HGB 8.9* 10.9* 9.2*  HCT 29.9* 33.5* 28.2*  PLT 142* 190 169   BMET Recent Labs    09/02/23 1408 09/03/23 0405 09/04/23 0311  NA 133* 135 133*  K 3.9 3.6 3.5  CL 96* 99 99  CO2 26 28 26   GLUCOSE 156* 103* 105*  BUN 28* 23 18  CREATININE 0.55* 0.50* 0.50*  CALCIUM  9.3 8.9 8.7*    Studies/Results: CT Angio Chest PE W and/or Wo Contrast Result Date: 09/02/2023 CLINICAL DATA:  Concern for pulmonary embolism. Metastatic lung cancer. EXAM: CT ANGIOGRAPHY CHEST WITH CONTRAST TECHNIQUE: Multidetector CT imaging of the chest was performed using the standard protocol during bolus administration of intravenous contrast.  Multiplanar CT image reconstructions and MIPs were obtained to evaluate the vascular anatomy. RADIATION DOSE REDUCTION: This exam was performed according to the departmental dose-optimization program which includes automated exposure control, adjustment of the mA and/or kV according to patient size and/or use of iterative reconstruction technique. CONTRAST:  60mL OMNIPAQUE  IOHEXOL  350 MG/ML SOLN COMPARISON:  Chest CT dated 07/18/2023. FINDINGS: Cardiovascular: There is no cardiomegaly or pericardial effusion. Advanced 3 vessel coronary vascular calcification. Moderate atherosclerotic calcification of the thoracic aorta. No aneurysmal dilatation or dissection. The origins of the great vessels of the aortic arch are patent. Right-sided Port-A-Cath with tip in the right atrium. No pulmonary artery embolus identified. Mediastinum/Nodes: No obvious adenopathy. Evaluation however is limited due to infiltration of the hilar and mediastinal fat planes. Mild diffuse circumferential thickening of the esophagus may be related to underdistention. No mediastinal fluid collection. Lungs/Pleura: Small right pleural effusion as well as similar appearance of loculated appearing small fluid posterior to the aortic arch. There is background of emphysema. Similar appearance of left perihilar consolidative changes, likely post treatment changes and fibrosis. Recurrent disease is not excluded. No new consolidation. Multiple small we right upper lobe nodules similar to prior CT measuring up to 4 mm in the right apex. No pneumothorax. The central airways remain patent. Upper Abdomen: Bilateral perinephric edema. Correlation with urinalysis recommended to exclude UTI. Musculoskeletal: Loss of subcutaneous fat and cachexia. Osteopenia with degenerative changes of the spine. No acute osseous pathology. Review of the MIP images confirms the above  findings. IMPRESSION: 1. No CT evidence of pulmonary artery embolus. 2. Small right pleural  effusion. 3. Similar appearance of left perihilar consolidative changes, likely post treatment changes and fibrosis. Recurrent disease is not excluded. 4. Aortic Atherosclerosis (ICD10-I70.0) and Emphysema (ICD10-J43.9). Electronically Signed   By: Angus Bark M.D.   On: 09/02/2023 17:09   EGD 09/03/2023 Impression:               - Congested, texture changed mucosa in the                            esophagus. Biopsied.                           - Non-obstructing Schatzki ring.                           - No gross lesions in the entire esophagus.                           - Z-line irregular, 42 cm from the incisors.                           - 2 cm hiatal hernia.                           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                            Ablated with APC.                           - Erythematous mucosa in the stomach. Biopsied.                           - Duodenal bulb AVM. Ablated with APC.                           - No other gross lesions in the duodenal bulb, in                            the first portion of the duodenum and in the second                            portion of the duodenum.                           - Normal major papilla. Moderate Sedation:      Not Applicable - Patient had care per Anesthesia. Recommendation:           - The patient will be observed post-procedure,                            until all discharge criteria are met.                           - Discharge patient to home.                           -  Patient has a contact number available for                            emergencies. The signs and symptoms of potential                            delayed complications were discussed with the                            patient. Return to normal activities tomorrow.                            Written discharge instructions were provided to the                            patient.                           - Advance diet as  tolerated.                           - Continue twice daily PPI.                           - Add Carafate  1 g slurry twice daily for 2 weeks.                            Then may stop.                           - Observe patient's clinical course.                           - Trend hemoglobin/hematocrit.                           - Follow-up pathology. I was informed that                            unfortunately due to technical error, both the                            esophagus and stomach biopsies were placed in the                            same jar.                           - He has brown stool on rectal today. I think we                            monitor him closely into tomorrow. If he continues                            to have issues of transfusion dependent anemia,  will consider colonoscopy +/- video capsule                            endoscopy as inpatient. If he is doing well into                            tomorrow with stable hemoglobin then I think he can                            probably be discharged with close monitoring and                            oral iron supplementation and be assessed as an                            outpatient for the role of video capsule endoscopy                            +/- colonoscopy.                           Assessment / Plan:   Assessment: 1.  Symptomatic anemia: With description of hematemesis, presented with shortness of breath and found to have weekly hemoglobin was 6.8 (8.7 on 08/20/2023)--> 1 unit PRBCs--> 7.3--> 1 unit PRBCs--> 8.9--> 9.2 overnight, no further hematemesis, EGD with AVMs in the stomach and duodenal bulb treated with APC which are likely the source 2.  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  COPD 4.  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  Weight loss  Plan: 1.  See Dr. Marolyn Sis EGD from yesterday, recommendations to continue twice daily PPI and add Carafate   1 g slurry twice daily for 2 weeks, then stop 2.  Patient will need to follow-up with his PCP for further monitoring of hemoglobin and iron studies in a couple of weeks 3.  Thank you for your kind consultation, we will sign off.   LOS: 2 days   Graciella Lavender  09/04/2023, 9:54 AM

## 2023-09-05 ENCOUNTER — Ambulatory Visit: Payer: Self-pay | Admitting: Gastroenterology

## 2023-09-05 ENCOUNTER — Telehealth: Payer: Self-pay

## 2023-09-05 NOTE — Transitions of Care (Post Inpatient/ED Visit) (Signed)
 09/05/2023  Name: Jesse [name] MRN: [id number] DOB: 1950-09-19  Today's TOC FU Call Status: Today's TOC FU Call Status:: Successful TOC FU Call Completed TOC FU Call Complete Date: 09/05/23 Patient's Name and Date of Birth confirmed.  Transition Care Management Follow-up Telephone Call Date of Discharge: 09/04/23 Discharge Facility: [facility]) Type of Discharge: Inpatient Admission Primary Inpatient Discharge Diagnosis:: Acute GI Bleed How have you been since you were released from the hospital?: Better Any questions or concerns?: No  Items Reviewed: Did you receive and understand the discharge instructions provided?: Yes Medications obtained,verified, and reconciled?: Yes (Medications Reviewed) Any new allergies since your discharge?: No Dietary orders reviewed?: Yes Do you have support at home?: Yes People in Home [RPT]: spouse Name of Support/Comfort Primary Source: [name]  Medications Reviewed Today: Medications Reviewed Today     Reviewed by Bedelia Pong, RN (Case Manager) on 09/05/23 at 1300  Med List Status: <None>   Medication Order Taking? Sig Documenting Provider Last Dose Status Informant  acetaminophen  (TYLENOL ) 500 MG tablet [id number] Yes Take 1,000 mg by mouth every 8 (eight) hours as needed for moderate pain. [provider] Taking Active Spouse/Significant Other, Pharmacy Records  aspirin  EC 81 MG tablet [id number] Yes Take 81 mg by mouth daily. Swallow whole. [provider] Taking Active Spouse/Significant Other, Pharmacy Records  atorvastatin  (LIPITOR) 20 MG tablet [id number] Yes Take 1 tablet (20 mg total) by mouth daily. [name], MD Taking Active Spouse/Significant Other, Pharmacy Records  Cholecalciferol  (VITAMIN D3 PO) [id number] Yes Take 2,000 Units by mouth daily. [provider] Taking Active Spouse/Significant Other, Pharmacy Records  cyanocobalamin  (VITAMIN B12) 500 MCG tablet [id number] Yes Take 500 mcg by mouth  daily. [provider] Taking Active Spouse/Significant Other, Pharmacy Records  dexamethasone  (DECADRON ) 4 MG tablet [id number] Yes PLEASE TAKE 1 TABLET TWICE A DAY THE DAY BEFORE, THE DAY OF, AND THE DAY AFTER CHEMOTHERAPY  Patient taking differently: Take 4 mg by mouth See admin instructions. Please take 1 tablet twice a day the day before, the day of, and the day after chemotherapy   Heilingoetter, Cassandra L, PA-C Taking Active Spouse/Significant Other, Pharmacy Records  diphenoxylate -atropine  (LOMOTIL ) 2.5-0.025 MG tablet [id number] Yes Take 2 tablets by mouth 4 (four) times daily. Kenney Peacemaker, MD Taking Active Spouse/Significant Other, Pharmacy Records  ferrous sulfate  325 [phone] FE) MG EC tablet [id number] Yes Take 1 tablet (325 mg total) by mouth daily with breakfast. Maggie Schooner, MD Taking Active   folic acid  (FOLVITE ) 1 MG tablet [id number] Yes TAKE 1 TABLET BY MOUTH EVERY DAY Marlene Simas, MD Taking Active Spouse/Significant Other, Pharmacy Records  gabapentin  (NEURONTIN ) 100 MG capsule [id number] Yes Take 1 capsule (100 mg total) by mouth at bedtime. [name], MD Taking Active Spouse/Significant Other, Pharmacy Records  lactobacillus acidophilus (BACID) TABS tablet [id number] Yes Take 2 tablets by mouth daily at 12 noon. [provider] Taking Active Spouse/Significant Other, Pharmacy Records  lidocaine  (XYLOCAINE ) 5 % ointment [id number]  Apply 1 Application topically 3 (three) times daily as needed for mild pain or moderate pain. Pickenpack-Cousar, Giles Labrum, NP  Active Spouse/Significant Other, Pharmacy Records  mirtazapine  (REMERON  SOL-TAB) 30 MG disintegrating tablet [id number] Yes TAKE 1 TABLET BY MOUTH EVERYDAY AT BEDTIME  Patient taking differently: Take 30 mg by mouth at bedtime.   Kenney Peacemaker, MD Taking Active Spouse/Significant Other, Pharmacy Records  Multiple Vitamins-Minerals (PRESERVISION AREDS 2) CAPS [id number] Yes Take 1 capsule by mouth 2 (two)  times daily.  [provider] Taking Active Spouse/Significant Other, Pharmacy Records  pantoprazole  (PROTONIX ) 40 MG tablet [id number] Yes Take 1 tablet (40 mg total) by mouth 2 (two) times daily before a meal. Amin, Ankit C, MD Taking Active   sucralfate  (CARAFATE ) 1 GM/10ML suspension [id number] Yes Take 10 mLs (1 g total) by mouth 2 (two) times daily for 14 days. Maggie Schooner, MD Taking Active   Med List Note [address], [facility] 03/19/22 5621): Imatinib  filled at [facility], must have patient's email ([email or web address]) on Rx            Home Care and Equipment/Supplies: Were Home Health Services Ordered?: No Any new equipment or medical supplies ordered?: No  Functional Questionnaire: Do you need assistance with bathing/showering or dressing?: No Do you need assistance with meal preparation?: No Do you need assistance with eating?: No Do you have difficulty maintaining continence: Yes (Bowel incontinence at times) Do you need assistance with getting out of bed/getting out of a chair/moving?: No Do you have difficulty managing or taking your medications?: Yes (spouse manages medications)  Follow up appointments reviewed: PCP Follow-up appointment confirmed?: Yes Date of PCP follow-up appointment?: 09/20/23 Follow-up Provider: Dr. Arliss [facility] Follow-up appointment confirmed?: Yes Date of Specialist follow-up appointment?: 09/10/23 Follow-Up Specialty Provider:: Oncology 5-27 GI 6/4 Do you need transportation to your follow-up appointment?: No Do you understand care options if your condition(s) worsen?: Yes-patient verbalized understanding  SDOH Interventions Today    Flowsheet Row Most Recent Value  SDOH Interventions   Food Insecurity Interventions Intervention Not Indicated  Housing Interventions Intervention Not Indicated  Transportation Interventions Intervention Not Indicated  Utilities Interventions Intervention Not Indicated        Lelan Cush J. Alexandros Ewan RN, MSN [facility]  [facility], [facility] RN Care Manager Direct Dial: [phone]  Fax: [phone] Website: [email or web address]

## 2023-09-05 NOTE — Patient Instructions (Signed)
 Visit Information  Thank you for taking time to visit with me today. Please don't hesitate to contact me if I can be of assistance to you before our next scheduled telephone appointment.  GI Bleed Discussed Symptoms: Be aware of symptoms like bloody or black stools, bloody vomit (or vomit that looks like coffee grounds), dizziness, lightheadedness, or sudden severe abdominal pain. Seek Immediate Help: If you experience any of these symptoms, especially if they are sudden or severe, seek immediate medical attention.     Patient verbalizes understanding of instructions and care plan provided today and agrees to view in MyChart. Active MyChart status and patient understanding of how to access instructions and care plan via MyChart confirmed with patient.     The patient has been provided with contact information for the care management team and has been advised to call with any health related questions or concerns.   Please call the care guide team at [phone] if you need to cancel or reschedule your appointment.   Please call the Suicide and Crisis Lifeline: 988 if you are experiencing a Mental Health or Behavioral Health Crisis or need someone to talk to.  Virgle Arth J. Michelyn Scullin RN, MSN [facility], [facility] RN Care Manager Direct Dial: [phone]  Fax: [phone] Website: [email or web address]

## 2023-09-09 NOTE — Progress Notes (Unsigned)
 [facility] OFFICE PROGRESS NOTE  [name], MD [address] [place] [address]  DIAGNOSIS:  1) recurrent non-small cell lung cancer, adenocarcinoma with positive mediastinal lymph node in December 2023 that was initially diagnosed as stage IIB (T1 a, N1, M0) non-small cell lung cancer, adenocarcinoma diagnosed in June 2021.  The patient also has evidence for recurrent disease in the mediastinal lymph node in January 2024 and in the right lower lobe in August 2024. 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   Biomarker Findings Tumor Mutational Burden - 20 Muts/Mb Microsatellite status - MS-Stable Genomic Findings For a complete list of the genes assayed, please refer to the Appendix. KEAP1 I461fs*17 DNMT3A E817* RB1 splice site 1960+5G>C TP53 R209* 8 Disease relevant genes with no reportable alterations: ALK, BRAF, EGFR, ERBB2, KRAS, MET, RET, ROS1   PDL1 Expression: 1 %  PRIOR THERAPY: 1) status post left upper lobectomy with lymph node dissection on December 16, 2019 under the care of Dr. Deloise Ferries.  The tumor size measured 0.9 cm but there was involvement of the level 10 L and 11 L.  2) Adjuvant systemic chemotherapy with cisplatin  75 mg/M2 and Alimta  500 mg/M2 every 3 weeks.  First dose February 04, 2020.  Status post 4 cycles. 3) Neoadjuvant treatment with imatinib  400 mg p.o. daily for the GIST tumor of the pelvic area.  First dose started July 18, 2020.  Status post 12 months of treatment. 4) status post robotic assisted very low anterior rectosigmoid resection with coloanal anastomosis, diverting loop ileostomy and wedge liver biopsy under the care of Dr. Hershell Lose on December 14, 2021 and it showed minimal residual gastrointestinal stromal tumor. 5) concurrent chemoradiation with weekly carboplatin  for AUC of 2 and paclitaxel  45 Mg/M2.  First dose May 14, 2022. 6) Imatinib  400 mg p.o. daily 7) Systemic chemotherapy with carboplatin  for AUC of  5 and Alimta  500 Mg/M2 every 3 weeks.  First dose 12/25/2022.  Status post 6 cycles.  CURRENT THERAPY: Maintenance treatment with single agent Alimta  500 Mg/M2, status post 6 cycles.   INTERVAL HISTORY: Jesse [name] 73 y.o. male returns to the clinic today for a follow-up visit. The patient was last seen by myself. The patient is currently undergoing single agent chemotherapy with Alimta .  He tolerates the fairly well.  The patient has been having some ongoing struggles with loose stool and diarrhea.  He has frequent bowel movements with small quantity. He previously had Gastrostomy tube. He previously had ileostomy which was takedown. He previously had a feeding tube.  He struggles with nutrition concerns and weight loss. He has recently started a high-calorie nutritional supplement, He has gained *** pounds.  He sees a member and the nutritionist team. He sees GI again in June and it sounds like they are considering him again for ileostomy.    The patient has stable but persistent anemia. His Hbg typically is in the 8's. In the interval since last been seen, he had worsening shortness of breath and presented to the ER where he had worsening anemia with a Hbg of 6.8. He received 2 units of blood and EGD  showing congested esophagus, Schatzki's ring, nonbleeding angiectasia ablated with APC, erythematous stomach, duodenal bulb AVM. The biposies are **. He was started on PPI and carafate .   His energy is ***. He denies visible bleeding or bruising. His energy is ***.   He experiences intermittent shortness of breath, particularly during exertion such as walking uphill or over  long distances. He has a history of lung cancer and scarring from prior treatments, which may contribute to these respiratory symptoms. No cough, headaches, or vision changes are reported.   No nausea or vomiting following his last treatment and no abdominal pain associated with his diarrhea. He frequently feels cold, which may be  related to his anemia, as his hemoglobin levels are consistently low, around 8.7 g/dL. He is currently taking iron supplements daily and receives B12 injections every three treatments. He also takes folic acid  as part of his regimen. He denies bleeding. He notes  night sweats, with the most recent episode occurring last night. Overall, he states that he seldomly gets night sweats.     He is here today for evaluation repeat blood work before undergoing cycle #7 of maintenance. However the patient called the clinic asking to delay treatment. Therefore, he is here for lab recheck today and we will work on rescheduling his treatment.     MEDICAL HISTORY: Past Medical History:  Diagnosis Date   Anemia    Cancer (HCC)    bladder cancer   COPD (chronic obstructive pulmonary disease) (HCC)    mild    DIVERTICULOSIS, COLON 01/14/2007   Qualifier: Diagnosis of  By: Rachell Budge RN, Arlena Belts    GASTRITIS, CHRONIC 06/01/2004   Qualifier: Diagnosis of  By: Tereasa Felty CMA (AAMA), Robin     GERD 01/09/2007   Qualifier: Diagnosis of  By: Ena Harries, RN, Ellen     Heart murmur    as a child; no murmur heard 12/14/19   History of radiation therapy    Mediastinum- 05/15/22-06/28/22-Dr. Retta Caster   HYPERLIPIDEMIA 01/09/2007   Qualifier: Diagnosis of  By: Georgie Kiss     Hypertension    Lung mass    left upper nodule   Macular degeneration    right eye   NEOPLASM, MALIGNANT, BLADDER, HX OF 2002   Qualifier: Diagnosis of  By: Tereasa Felty CMA (AAMA), Robin  / no chemo or radiation   OSTEOARTHRITIS 01/14/2007   Qualifier: Diagnosis of  By: Rachell Budge, RN, Arlena Belts - knees   Rectal mass    Reflux esophagitis 06/01/2004   Qualifier: Diagnosis of  By: Tereasa Felty CMA Nonie Beady), Robin     Stroke [facility])    2024   TOBACCO USER 02/16/2009   Qualifier: Diagnosis of  By: Minnette Amato  MD, Ronie Cohen    VITAMIN D  DEFICIENCY 06/03/2007   Qualifier: Diagnosis of  By: Minnette Amato  MD, Ronie Cohen     ALLERGIES:  has no known  allergies.  MEDICATIONS:  Current Outpatient Medications  Medication Sig Dispense Refill   acetaminophen  (TYLENOL ) 500 MG tablet Take 1,000 mg by mouth every 8 (eight) hours as needed for moderate pain.     aspirin  EC 81 MG tablet Take 81 mg by mouth daily. Swallow whole.     atorvastatin  (LIPITOR) 20 MG tablet Take 1 tablet (20 mg total) by mouth daily. 90 tablet 3   Cholecalciferol  (VITAMIN D3 PO) Take 2,000 Units by mouth daily.     cyanocobalamin  (VITAMIN B12) 500 MCG tablet Take 500 mcg by mouth daily.     dexamethasone  (DECADRON ) 4 MG tablet PLEASE TAKE 1 TABLET TWICE A DAY THE DAY BEFORE, THE DAY OF, AND THE DAY AFTER CHEMOTHERAPY (Patient taking differently: Take 4 mg by mouth See admin instructions. Please take 1 tablet twice a day the day before, the day of, and the day after chemotherapy) 60 tablet 0   diphenoxylate -atropine  (LOMOTIL ) 2.5-0.025  MG tablet Take 2 tablets by mouth 4 (four) times daily. 240 tablet 5   ferrous sulfate  325 (65 FE) MG EC tablet Take 1 tablet (325 mg total) by mouth daily with breakfast. 90 tablet 0   folic acid  (FOLVITE ) 1 MG tablet TAKE 1 TABLET BY MOUTH EVERY DAY 90 tablet 1   gabapentin  (NEURONTIN ) 100 MG capsule Take 1 capsule (100 mg total) by mouth at bedtime. 90 capsule 3   lactobacillus acidophilus (BACID) TABS tablet Take 2 tablets by mouth daily at 12 noon.     lidocaine  (XYLOCAINE ) 5 % ointment Apply 1 Application topically 3 (three) times daily as needed for mild pain or moderate pain. 35.44 g 3   mirtazapine  (REMERON  SOL-TAB) 30 MG disintegrating tablet TAKE 1 TABLET BY MOUTH EVERYDAY AT BEDTIME (Patient taking differently: Take 30 mg by mouth at bedtime.) 90 tablet 1   Multiple Vitamins-Minerals (PRESERVISION AREDS 2) CAPS Take 1 capsule by mouth 2 (two) times daily.     pantoprazole  (PROTONIX ) 40 MG tablet Take 1 tablet (40 mg total) by mouth 2 (two) times daily before a meal. 60 tablet 0   sucralfate  (CARAFATE ) 1 GM/10ML suspension Take 10 mLs  (1 g total) by mouth 2 (two) times daily for 14 days. 280 mL 0   No current facility-administered medications for this visit.    SURGICAL HISTORY:  Past Surgical History:  Procedure Laterality Date   BIOPSY  06/18/2022   Procedure: BIOPSY;  Surgeon: Annis Kinder, DO;  Location: [facility];  Service: Gastroenterology;;   BIOPSY OF SKIN SUBCUTANEOUS TISSUE AND/OR MUCOUS MEMBRANE  09/03/2023   Procedure: BIOPSY, SKIN, SUBCUTANEOUS TISSUE, OR MUCOUS MEMBRANE;  Surgeon: Normie Becton., MD;  Location: [facility];  Service: Gastroenterology;;   BREAST BIOPSY Left 03/16/2022   US  LT RADIOACTIVE SEED LOC 03/16/2022 [facility]   BRONCHIAL BIOPSY  10/13/2019   Procedure: BRONCHIAL BIOPSIES;  Surgeon: Prudy Brownie, DO;  Location: [facility];  Service: Pulmonary;;   BRONCHIAL BIOPSY  12/04/2022   Procedure: BRONCHIAL BIOPSIES;  Surgeon: Prudy Brownie, DO;  Location: [facility];  Service: Pulmonary;;   BRONCHIAL BRUSHINGS  10/13/2019   Procedure: BRONCHIAL BRUSHINGS;  Surgeon: Prudy Brownie, DO;  [facility]: [facility];  Service: Pulmonary;;   BRONCHIAL NEEDLE ASPIRATION BIOPSY  10/13/2019   Procedure: BRONCHIAL NEEDLE ASPIRATION BIOPSIES;  Surgeon: Prudy Brownie, DO;  [facility]: [facility];  Service: Pulmonary;;   BRONCHIAL NEEDLE ASPIRATION BIOPSY  04/11/2022   Procedure: BRONCHIAL NEEDLE ASPIRATION BIOPSIES;  Surgeon: Prudy Brownie, DO;  Location: [facility];  Service: Cardiopulmonary;;   BRONCHIAL NEEDLE ASPIRATION BIOPSY  12/04/2022   Procedure: BRONCHIAL NEEDLE ASPIRATION BIOPSIES;  Surgeon: Prudy Brownie, DO;  [facility];  Service: Pulmonary;;   BRONCHIAL WASHINGS  10/13/2019   Procedure: BRONCHIAL WASHINGS;  Surgeon: Prudy Brownie, DO;  [facility];  Service: Pulmonary;;   BRONCHIAL WASHINGS  12/04/2022   Procedure: BRONCHIAL WASHINGS;  Surgeon: Prudy Brownie, DO;  [facility];  Service: Pulmonary;;   COLONOSCOPY      multiple   DIVERTING ILEOSTOMY N/A 12/14/2021   Procedure: DIVERTING ILEOSTOMY;  Surgeon: Candyce Champagne, MD;  Location: [facility];  Service: General;  Laterality: N/A;   ENDOBRONCHIAL ULTRASOUND Bilateral 04/11/2022   Procedure: ENDOBRONCHIAL ULTRASOUND;  Surgeon: Prudy Brownie, DO;  Location: [facility];  Service: Cardiopulmonary;  Laterality: Bilateral;   ESOPHAGEAL DILATION  06/18/2022   Procedure: ESOPHAGEAL DILATION;  Surgeon: Annis Kinder, DO;  Location:  [facility];  Service: Gastroenterology;;   ESOPHAGOGASTRODUODENOSCOPY N/A 09/03/2023   Procedure: EGD (ESOPHAGOGASTRODUODENOSCOPY);  Surgeon: Normie Becton., MD;  Location: [facility];  Service: Gastroenterology;  Laterality: N/A;   ESOPHAGOGASTRODUODENOSCOPY (EGD) WITH PROPOFOL  N/A 06/18/2022   Procedure: ESOPHAGOGASTRODUODENOSCOPY (EGD) WITH PROPOFOL ;  Surgeon: Annis Kinder, DO;  [facility]: [facility];  Service: Gastroenterology;  Laterality: N/A;  Severe dysphagia/odynophagia ?radiation esophagitis versus oral candidiasis   FIDUCIAL MARKER PLACEMENT  12/04/2022   Procedure: FIDUCIAL MARKER PLACEMENT;  Surgeon: Prudy Brownie, DO;  [facility];  Service: Pulmonary;;   ILEOSTOMY CLOSURE N/A 03/22/2022   Procedure: OPEN TAKEDOWN OF LOOP ILEOSTOMY;  Surgeon: Candyce Champagne, MD;  Location: [facility];  Service: General;  Laterality: N/A;  GEN w/ERAS PATHWAY LOCAL   INTERCOSTAL NERVE BLOCK Left 12/16/2019   Procedure: INTERCOSTAL NERVE BLOCK;  Surgeon: Hilarie Lovely, MD;  [facility];  Service: Thoracic;  Laterality: Left;   IR FLUORO RM 30-60 MIN  07/02/2022   IR IMAGING GUIDED PORT INSERTION  05/21/2023   KNEE SURGERY  07/20/2008   bilat.   NASAL SEPTUM SURGERY  1995   NODE DISSECTION Left 12/16/2019   Procedure: NODE DISSECTION;  Surgeon: Hilarie Lovely, MD;  [facility]: [facility];  Service: Thoracic;  Laterality: Left;   PEG PLACEMENT N/A 07/04/2022   Procedure: PERCUTANEOUS ENDOSCOPIC GASTROSTOMY  (PEG) PLACEMENT;  Surgeon: Kenney Peacemaker, MD;  Location: [facility];  Service: Gastroenterology;  Laterality: N/A;   RADIOACTIVE SEED GUIDED AXILLARY SENTINEL LYMPH NODE Left 03/20/2022   Procedure: RADIOACTIVE SEED GUIDED LEFT AXILLARY SENTINEL LYMPH NODE BIOPSY;  Surgeon: Sim Dryer, MD;  [facility];  Service: General;  Laterality: Left;   RECTAL EXAM UNDER ANESTHESIA N/A 03/22/2022   Procedure: ANORECTAL EXAMINATION UNDER ANESTHESIA;  Surgeon: Candyce Champagne, MD;  Location: [facility];  Service: General;  Laterality: N/A;   TONSILLECTOMY     TRANSURETHRAL RESECTION OF BLADDER TUMOR  2002   UPPER GASTROINTESTINAL ENDOSCOPY     VIDEO BRONCHOSCOPY  04/11/2022   Procedure: VIDEO BRONCHOSCOPY;  Surgeon: Prudy Brownie, DO;  Location: [facility];  Service: Cardiopulmonary;;   VIDEO BRONCHOSCOPY WITH ENDOBRONCHIAL NAVIGATION N/A 10/13/2019   Procedure: VIDEO BRONCHOSCOPY WITH ENDOBRONCHIAL NAVIGATION;  Surgeon: Prudy Brownie, DO;  [facility];  Service: Pulmonary;  Laterality: N/A;   WISDOM TOOTH EXTRACTION     XI ROBOTIC ASSISTED LOWER ANTERIOR RESECTION N/A 12/14/2021   Procedure: XI ROBOTIC ASSISTED LOWER ANTERIOR ULTRA LOW RECTOSIGMOID RESECTION, COLOANAL HAND SEWN ANASTOMOSIS, AND BILATERAL TAP BLOCK;  Surgeon: Candyce Champagne, MD;  Location: [facility];  Service: General;  Laterality: N/A;    REVIEW OF SYSTEMS:   Review of Systems  Constitutional: Negative for appetite change, chills, fatigue, fever and unexpected weight change.  HENT:   Negative for mouth sores, nosebleeds, sore throat and trouble swallowing.   Eyes: Negative for eye problems and icterus.  Respiratory: Negative for cough, hemoptysis, shortness of breath and wheezing.   Cardiovascular: Negative for chest pain and leg swelling.  Gastrointestinal: Negative for abdominal pain, constipation, diarrhea, nausea and vomiting.  Genitourinary: Negative for bladder incontinence, difficulty urinating, dysuria, frequency and  hematuria.   Musculoskeletal: Negative for back pain, gait problem, neck pain and neck stiffness.  Skin: Negative for itching and rash.  Neurological: Negative for dizziness, extremity weakness, gait problem, headaches, light-headedness and seizures.  Hematological: Negative for adenopathy. Does not bruise/bleed easily.  Psychiatric/Behavioral: Negative for confusion, depression and sleep disturbance. The patient is not nervous/anxious.  PHYSICAL EXAMINATION:  There were no vitals taken for this visit.  ECOG PERFORMANCE STATUS: {CHL ONC ECOG [id number]  Physical Exam  Constitutional: Oriented to person, place, and time and well-developed, well-nourished, and in no distress. No distress.  HENT:  Head: Normocephalic and atraumatic.  Mouth/Throat: Oropharynx is clear and moist. No oropharyngeal exudate.  Eyes: Conjunctivae are normal. Right eye exhibits no discharge. Left eye exhibits no discharge. No scleral icterus.  Neck: Normal range of motion. Neck supple.  Cardiovascular: Normal rate, regular rhythm, normal heart sounds and intact distal pulses.   Pulmonary/Chest: Effort normal and breath sounds normal. No respiratory distress. No wheezes. No rales.  Abdominal: Soft. Bowel sounds are normal. Exhibits no distension and no mass. There is no tenderness.  Musculoskeletal: Normal range of motion. Exhibits no edema.  Lymphadenopathy:    No cervical adenopathy.  Neurological: Alert and oriented to person, place, and time. Exhibits normal muscle tone. Gait normal. Coordination normal.  Skin: Skin is warm and dry. No rash noted. Not diaphoretic. No erythema. No pallor.  Psychiatric: Mood, memory and judgment normal.  Vitals reviewed.  LABORATORY DATA: Lab Results  Component Value Date   WBC 6.8 09/04/2023   HGB 9.2 (L) 09/04/2023   HCT 28.2 (L) 09/04/2023   MCV 93.4 09/04/2023   PLT 169 09/04/2023      Chemistry      Component Value Date/Time   NA 133 (L) 09/04/2023 0311    K 3.5 09/04/2023 0311   CL 99 09/04/2023 0311   CO2 26 09/04/2023 0311   BUN 18 09/04/2023 0311   CREATININE 0.50 (L) 09/04/2023 0311   CREATININE 0.58 (L) 08/20/2023 1116      Component Value Date/Time   CALCIUM  8.7 (L) 09/04/2023 0311   ALKPHOS 126 08/20/2023 1116   AST 38 08/20/2023 1116   ALT 44 08/20/2023 1116   BILITOT 0.3 08/20/2023 1116       RADIOGRAPHIC STUDIES:  CT Angio Chest PE W and/or Wo Contrast Result Date: 09/02/2023 CLINICAL DATA:  Concern for pulmonary embolism. Metastatic lung cancer. EXAM: CT ANGIOGRAPHY CHEST WITH CONTRAST TECHNIQUE: Multidetector CT imaging of the chest was performed using the standard protocol during bolus administration of intravenous contrast. Multiplanar CT image reconstructions and MIPs were obtained to evaluate the vascular anatomy. RADIATION DOSE REDUCTION: This exam was performed according to the departmental dose-optimization program which includes automated exposure control, adjustment of the mA and/or kV according to patient size and/or use of iterative reconstruction technique. CONTRAST:  60mL OMNIPAQUE  IOHEXOL  350 MG/ML SOLN COMPARISON:  Chest CT dated 07/18/2023. FINDINGS: Cardiovascular: There is no cardiomegaly or pericardial effusion. Advanced 3 vessel coronary vascular calcification. Moderate atherosclerotic calcification of the thoracic aorta. No aneurysmal dilatation or dissection. The origins of the great vessels of the aortic arch are patent. Right-sided Port-A-Cath with tip in the right atrium. No pulmonary artery embolus identified. Mediastinum/Nodes: No obvious adenopathy. Evaluation however is limited due to infiltration of the hilar and mediastinal fat planes. Mild diffuse circumferential thickening of the esophagus may be related to underdistention. No mediastinal fluid collection. Lungs/Pleura: Small right pleural effusion as well as similar appearance of loculated appearing small fluid posterior to the aortic arch. There is  background of emphysema. Similar appearance of left perihilar consolidative changes, likely post treatment changes and fibrosis. Recurrent disease is not excluded. No new consolidation. Multiple small we right upper lobe nodules similar to prior CT measuring up to 4 mm in the right apex. No pneumothorax. The central airways remain patent.  Upper Abdomen: Bilateral perinephric edema. Correlation with urinalysis recommended to exclude UTI. Musculoskeletal: Loss of subcutaneous fat and cachexia. Osteopenia with degenerative changes of the spine. No acute osseous pathology. Review of the MIP images confirms the above findings. IMPRESSION: 1. No CT evidence of pulmonary artery embolus. 2. Small right pleural effusion. 3. Similar appearance of left perihilar consolidative changes, likely post treatment changes and fibrosis. Recurrent disease is not excluded. 4. Aortic Atherosclerosis (ICD10-I70.0) and Emphysema (ICD10-J43.9). Electronically Signed   By: Angus Bark M.D.   On: 09/02/2023 17:09     ASSESSMENT/PLAN:  This is a very pleasant 73 year old Caucasian male diagnosed with recurrent disease but he was initially diagnosed as stage IIIb (T1a, N1, M0) non-small cell lung cancer, adenocarcinoma.  He was initially diagnosed in June 2021 and is status post left upper lobectomy with lymph node dissection on 12/16/2019 to the care of Dr. Deloise Ferries.  The patient has no actionable mutations and his PD-L1 expression is 1%.    For his lung cancer, the patient initially completed adjuvant systemic chemotherapy with cisplatin  75 mg/m and Alimta  500 mg/m IV every 3 weeks status post 4 cycles.    For the pelvic GIST tumor, the patient completed neoadjuvant treatment with imatinib  400 mg p.o. daily in April 2023.  He patient underwent surgical resection of the remaining gastrointestinal stromal tumor under the care of Dr. Hershell Lose on December 14, 2021 with very minimal residual disease.     In November 2023, his restaging  CT scan for his lung cancer showed increased soft tissue density in the left hilar region concerning for possible developing lymphadenopathy or disease recurrence.  A PET scan was performed and showed new hypermetabolic left axillary, mediastinal, and left hilar adenopathy compatible with disease recurrence.   He underwent excisional biopsy of left axillary lymph node which was not conclusive of malignancy.   Therefore Dr. Thelda Finney performed a repeat video bronchoscopy with EBUS on 04/11/2022 and the biopsy from the station 7 lymph node showed recurrent adenocarcinoma.   Therefore the patient started concurrent chemoradiation with carboplatin  for an AUC of 2 and paclitaxel  45 mg/m. His first dose was on 05/14/2022. The patient was admitted to the hospital on June 07, 2022 with significant sepsis and Klebsiella pneumonia bacteremia.    He continues to have persistent diarrhea despite discontinuation of imatinib  and treatment with cholestyramine . This is likely functional diarrhea after his surgical resection for the pelvic GIST tumor.    Repeat PET scan showed no concerning findings for disease progression except for suspicious slowly growing adenocarcinoma in the right lower lobe but inflammatory changes in the right lung as well as suspicious reactive mediastinal lymphadenopathy.  The patient had repeat bronchoscopy by Dr. Thelda Finney and the final pathology was consistent with recurrent non-small cell lung cancer. The patient started systemic chemotherapy with carboplatin  for AUC of 5 and Alimta  500 Mg/M2 on 12/25/2022.  Status post 6 cycles.    He is currently on maintenance treatment with single agent Alimta  every 3 weeks status post 6 cycles.   The patient would like to delay treatment by ***. Therefore I will defer his care plan.   His labs today show ***. I will check iron studies and if significantly low will arrange for IV iron.   IVF???  Will see him back for follow-up visit in 4 weeks for  evaluation and repeat blood work before undergoing cycle #8.  Anemia Chronic anemia with hemoglobin at 8.7, contributing to fatigue and cold intolerance. No bleeding or  vitamin deficiencies reported. - Check vitamin levels. t. - Arrange IV iron if needed. . - Check B12 and folate levels.   Chronic diarrhea Chronic diarrhea with frequent bowel movements contributing to weight loss and potential nutritional deficiencies. Surgical intervention under consideration. - Continue discussions regarding potential surgical intervention with GI - Monitor weight and nutritional intake. He will try the new protein supplements recommended    The patient was advised to call immediately if she has any concerning symptoms in the interval. The patient voices understanding of current disease status and treatment options and is in agreement with the current care plan. All questions were answered. The patient knows to call the clinic with any problems, questions or concerns. We can certainly see the patient much sooner if necessary         No orders of the defined types were placed in this encounter.    I spent {CHL ONC TIME VISIT - WUJWJ:[id number]} counseling the patient face to face. The total time spent in the appointment was {CHL ONC TIME VISIT - OZHYQ:[id number]}.  Antrone Walla L Aadan Chenier, PA-C 09/09/23

## 2023-09-10 ENCOUNTER — Inpatient Hospital Stay

## 2023-09-10 ENCOUNTER — Inpatient Hospital Stay: Admitting: Physician Assistant

## 2023-09-10 VITALS — BP 131/78 | HR 103 | Temp 98.0°F | Resp 17 | Wt 125.3 lb

## 2023-09-10 DIAGNOSIS — C49A9 Gastrointestinal stromal tumor of other sites: Secondary | ICD-10-CM | POA: Diagnosis not present

## 2023-09-10 DIAGNOSIS — C3431 Malignant neoplasm of lower lobe, right bronchus or lung: Secondary | ICD-10-CM

## 2023-09-10 DIAGNOSIS — D649 Anemia, unspecified: Secondary | ICD-10-CM

## 2023-09-10 DIAGNOSIS — Z923 Personal history of irradiation: Secondary | ICD-10-CM | POA: Diagnosis not present

## 2023-09-10 DIAGNOSIS — Z9221 Personal history of antineoplastic chemotherapy: Secondary | ICD-10-CM | POA: Diagnosis not present

## 2023-09-10 DIAGNOSIS — Z5111 Encounter for antineoplastic chemotherapy: Secondary | ICD-10-CM | POA: Diagnosis not present

## 2023-09-10 DIAGNOSIS — C771 Secondary and unspecified malignant neoplasm of intrathoracic lymph nodes: Secondary | ICD-10-CM | POA: Diagnosis not present

## 2023-09-10 DIAGNOSIS — Z79899 Other long term (current) drug therapy: Secondary | ICD-10-CM | POA: Diagnosis not present

## 2023-09-10 DIAGNOSIS — C3492 Malignant neoplasm of unspecified part of left bronchus or lung: Secondary | ICD-10-CM

## 2023-09-10 DIAGNOSIS — Z7982 Long term (current) use of aspirin: Secondary | ICD-10-CM | POA: Diagnosis not present

## 2023-09-10 DIAGNOSIS — Z902 Acquired absence of lung [part of]: Secondary | ICD-10-CM | POA: Diagnosis not present

## 2023-09-10 DIAGNOSIS — Z95828 Presence of other vascular implants and grafts: Secondary | ICD-10-CM

## 2023-09-10 DIAGNOSIS — C3412 Malignant neoplasm of upper lobe, left bronchus or lung: Secondary | ICD-10-CM | POA: Diagnosis not present

## 2023-09-10 DIAGNOSIS — T451X5A Adverse effect of antineoplastic and immunosuppressive drugs, initial encounter: Secondary | ICD-10-CM

## 2023-09-10 LAB — CMP (CANCER CENTER ONLY)
ALT: 51 U/L — ABNORMAL HIGH (ref 0–44)
AST: 41 U/L (ref 15–41)
Albumin: 3 g/dL — ABNORMAL LOW (ref 3.5–5.0)
Alkaline Phosphatase: 121 U/L (ref 38–126)
Anion gap: 7 (ref 5–15)
BUN: 22 mg/dL (ref 8–23)
CO2: 31 mmol/L (ref 22–32)
Calcium: 9.2 mg/dL (ref 8.9–10.3)
Chloride: 99 mmol/L (ref 98–111)
Creatinine: 0.47 mg/dL — ABNORMAL LOW (ref 0.61–1.24)
GFR, Estimated: >60 mL/min (ref >60)
Glucose, Bld: 110 mg/dL — ABNORMAL HIGH (ref 70–99)
Potassium: 3.8 mmol/L (ref 3.5–5.1)
Sodium: 137 mmol/L (ref 135–145)
Total Bilirubin: 0.3 mg/dL (ref 0.0–1.2)
Total Protein: 7.1 g/dL (ref 6.5–8.1)

## 2023-09-10 LAB — CBC WITH DIFFERENTIAL (CANCER CENTER ONLY)
Abs Immature Granulocytes: 0.03 10*3/uL (ref 0.00–0.07)
Basophils Absolute: 0 10*3/uL (ref 0.0–0.1)
Basophils Relative: 0 %
Eosinophils Absolute: 0.1 10*3/uL (ref 0.0–0.5)
Eosinophils Relative: 3 %
HCT: 31 % — ABNORMAL LOW (ref 39.0–52.0)
Hemoglobin: 10 g/dL — ABNORMAL LOW (ref 13.0–17.0)
Immature Granulocytes: 1 %
Lymphocytes Relative: 13 %
Lymphs Abs: 0.6 10*3/uL — ABNORMAL LOW (ref 0.7–4.0)
MCH: 29.4 pg (ref 26.0–34.0)
MCHC: 32.3 g/dL (ref 30.0–36.0)
MCV: 91.2 fL (ref 80.0–100.0)
Monocytes Absolute: 0.5 10*3/uL (ref 0.1–1.0)
Monocytes Relative: 11 %
Neutro Abs: 3.2 10*3/uL (ref 1.7–7.7)
Neutrophils Relative %: 72 %
Platelet Count: 307 10*3/uL (ref 150–400)
RBC: 3.4 MIL/uL — ABNORMAL LOW (ref 4.22–5.81)
RDW: 16.3 % — ABNORMAL HIGH (ref 11.5–15.5)
WBC Count: 4.5 10*3/uL (ref 4.0–10.5)
nRBC: 0 % (ref 0.0–0.2)

## 2023-09-10 LAB — VITAMIN B12: Vitamin B-12: 3240 pg/mL — ABNORMAL HIGH (ref 180–914)

## 2023-09-10 LAB — SAMPLE TO BLOOD BANK

## 2023-09-10 LAB — FOLATE: Folate: 37.5 ng/mL (ref >5.9)

## 2023-09-10 MED ORDER — SODIUM CHLORIDE 0.9% FLUSH
10.0000 mL | Freq: Once | INTRAVENOUS | Status: AC
  Administered 2023-09-10: 10 mL

## 2023-09-18 ENCOUNTER — Ambulatory Visit: Admitting: Internal Medicine

## 2023-09-18 ENCOUNTER — Encounter (HOSPITAL_COMMUNITY): Payer: Self-pay

## 2023-09-18 ENCOUNTER — Encounter: Payer: Self-pay | Admitting: Internal Medicine

## 2023-09-18 ENCOUNTER — Emergency Department (HOSPITAL_COMMUNITY)

## 2023-09-18 ENCOUNTER — Emergency Department (HOSPITAL_COMMUNITY)
Admission: EM | Admit: 2023-09-18 | Discharge: 2023-09-18 | Disposition: A | Discharge status: Home / Self Care | Attending: Emergency Medicine | Admitting: Emergency Medicine

## 2023-09-18 VITALS — BP 92/70 | HR 131 | Ht 68.0 in | Wt 123.3 lb

## 2023-09-18 DIAGNOSIS — J449 Chronic obstructive pulmonary disease, unspecified: Secondary | ICD-10-CM | POA: Diagnosis not present

## 2023-09-18 DIAGNOSIS — J9811 Atelectasis: Secondary | ICD-10-CM | POA: Diagnosis not present

## 2023-09-18 DIAGNOSIS — I10 Essential (primary) hypertension: Secondary | ICD-10-CM | POA: Insufficient documentation

## 2023-09-18 DIAGNOSIS — E860 Dehydration: Secondary | ICD-10-CM | POA: Insufficient documentation

## 2023-09-18 DIAGNOSIS — Z8551 Personal history of malignant neoplasm of bladder: Secondary | ICD-10-CM | POA: Insufficient documentation

## 2023-09-18 DIAGNOSIS — Z8673 Personal history of transient ischemic attack (TIA), and cerebral infarction without residual deficits: Secondary | ICD-10-CM | POA: Diagnosis not present

## 2023-09-18 DIAGNOSIS — R634 Abnormal weight loss: Secondary | ICD-10-CM | POA: Diagnosis not present

## 2023-09-18 DIAGNOSIS — R531 Weakness: Secondary | ICD-10-CM | POA: Diagnosis not present

## 2023-09-18 DIAGNOSIS — Z452 Encounter for adjustment and management of vascular access device: Secondary | ICD-10-CM | POA: Diagnosis not present

## 2023-09-18 DIAGNOSIS — R197 Diarrhea, unspecified: Secondary | ICD-10-CM | POA: Diagnosis not present

## 2023-09-18 DIAGNOSIS — Z720 Tobacco use: Secondary | ICD-10-CM | POA: Diagnosis not present

## 2023-09-18 DIAGNOSIS — R000 Tachycardia, unspecified: Secondary | ICD-10-CM

## 2023-09-18 DIAGNOSIS — R630 Anorexia: Secondary | ICD-10-CM

## 2023-09-18 DIAGNOSIS — R627 Adult failure to thrive: Secondary | ICD-10-CM

## 2023-09-18 LAB — CBC WITH DIFFERENTIAL/PLATELET
Abs Immature Granulocytes: 0.03 10*3/uL (ref 0.00–0.07)
Basophils Absolute: 0 10*3/uL (ref 0.0–0.1)
Basophils Relative: 0 %
Eosinophils Absolute: 0.1 10*3/uL (ref 0.0–0.5)
Eosinophils Relative: 1 %
HCT: 33.8 % — ABNORMAL LOW (ref 39.0–52.0)
Hemoglobin: 10.5 g/dL — ABNORMAL LOW (ref 13.0–17.0)
Immature Granulocytes: 0 %
Lymphocytes Relative: 5 %
Lymphs Abs: 0.5 10*3/uL — ABNORMAL LOW (ref 0.7–4.0)
MCH: 30 pg (ref 26.0–34.0)
MCHC: 31.1 g/dL (ref 30.0–36.0)
MCV: 96.6 fL (ref 80.0–100.0)
Monocytes Absolute: 0.5 10*3/uL (ref 0.1–1.0)
Monocytes Relative: 5 %
Neutro Abs: 9.3 10*3/uL — ABNORMAL HIGH (ref 1.7–7.7)
Neutrophils Relative %: 89 %
Platelets: 220 10*3/uL (ref 150–400)
RBC: 3.5 MIL/uL — ABNORMAL LOW (ref 4.22–5.81)
RDW: 17.2 % — ABNORMAL HIGH (ref 11.5–15.5)
WBC: 10.4 10*3/uL (ref 4.0–10.5)
nRBC: 0 % (ref 0.0–0.2)

## 2023-09-18 LAB — URINALYSIS, ROUTINE W REFLEX MICROSCOPIC
Bilirubin Urine: NEGATIVE
Glucose, UA: NEGATIVE mg/dL
Hgb urine dipstick: NEGATIVE
Ketones, ur: NEGATIVE mg/dL
Leukocytes,Ua: NEGATIVE
Nitrite: NEGATIVE
Protein, ur: NEGATIVE mg/dL
Specific Gravity, Urine: 1 — ABNORMAL LOW (ref 1.005–1.030)
pH: 6 (ref 5.0–8.0)

## 2023-09-18 LAB — TYPE AND SCREEN
ABO/RH(D): O POS
Antibody Screen: NEGATIVE

## 2023-09-18 LAB — COMPREHENSIVE METABOLIC PANEL WITH GFR
ALT: 49 U/L — ABNORMAL HIGH (ref 0–44)
AST: 33 U/L (ref 15–41)
Albumin: 2.5 g/dL — ABNORMAL LOW (ref 3.5–5.0)
Alkaline Phosphatase: 136 U/L — ABNORMAL HIGH (ref 38–126)
Anion gap: 11 (ref 5–15)
BUN: 28 mg/dL — ABNORMAL HIGH (ref 8–23)
CO2: 27 mmol/L (ref 22–32)
Calcium: 9.2 mg/dL (ref 8.9–10.3)
Chloride: 97 mmol/L — ABNORMAL LOW (ref 98–111)
Creatinine, Ser: 0.6 mg/dL — ABNORMAL LOW (ref 0.61–1.24)
GFR, Estimated: >60 mL/min (ref >60)
Glucose, Bld: 171 mg/dL — ABNORMAL HIGH (ref 70–99)
Potassium: 3.5 mmol/L (ref 3.5–5.1)
Sodium: 135 mmol/L (ref 135–145)
Total Bilirubin: 0.5 mg/dL (ref 0.0–1.2)
Total Protein: 7 g/dL (ref 6.5–8.1)

## 2023-09-18 MED ORDER — LACTATED RINGERS IV BOLUS
1000.0000 mL | Freq: Once | INTRAVENOUS | Status: AC
  Administered 2023-09-18: 1000 mL via INTRAVENOUS

## 2023-09-18 NOTE — ED Provider Notes (Signed)
 [place] EMERGENCY DEPARTMENT AT [facility] Provider Note   CSN: [id number] Arrival date & time: 09/18/23  1056     History  Chief Complaint  Patient presents with   Weakness   Hypotension    Jesse [name] is a 73 y.o. male with PMH as listed below who presents with his wife who provides additional history.  Patient reports fatigue, generalized weakness for the last couple of days.  He went to GI clinic today for an outpatient appointment where he was noted to be very weak, tachycardic into the 130s, with a soft blood pressure 92/70 and was recommended to come to [facility] emergency department.  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  He denies any cough, chest pain, shortness of breath, abdominal pain, nausea vomiting diarrhea constipation, hematochezia/melena, leg swelling, falls, head trauma.  The other day a couple of days ago he had a dizzy/woozy spell and felt very lightheaded but did not fall or hit his head.  Wife at bedside states he does not eat or drink enough fluids.  Last chemo was 4/29, and the last chemoinfusion was postponed due to the patient feeling unwell.    Past Medical History:  Diagnosis Date   Anemia    Cancer (HCC)    bladder cancer   COPD (chronic obstructive pulmonary disease) (HCC)    mild    DIVERTICULOSIS, COLON 01/14/2007   Qualifier: Diagnosis of  By: Rachell Budge RN, Arlena Belts    GASTRITIS, CHRONIC 06/01/2004   Qualifier: Diagnosis of  By: Tereasa Felty CMA (AAMA), Robin     GERD 01/09/2007   Qualifier: Diagnosis of  By: Ena Harries, RN, Ellen     Heart murmur    as a child; no murmur heard 12/14/19   History of radiation therapy    Mediastinum- 05/15/22-06/28/22-Dr. Retta Caster   HYPERLIPIDEMIA 01/09/2007   Qualifier: Diagnosis of  By: Georgie Kiss     Hypertension    Lung mass    left upper nodule   Macular degeneration    right eye   NEOPLASM, MALIGNANT, BLADDER, HX OF 2002    Qualifier: Diagnosis of  By: Tereasa Felty CMA (AAMA), Robin  / no chemo or radiation   OSTEOARTHRITIS 01/14/2007   Qualifier: Diagnosis of  By: Rachell Budge, RN, Arlena Belts - knees   Rectal mass    Reflux esophagitis 06/01/2004   Qualifier: Diagnosis of  By: Tereasa Felty CMA Nonie Beady), Robin     Stroke [facility])    2024   TOBACCO USER 02/16/2009   Qualifier: Diagnosis of  By: Minnette Amato  MD, Ronie Cohen    VITAMIN D  DEFICIENCY 06/03/2007   Qualifier: Diagnosis of  By: Minnette Amato  MD, Ronie Cohen        Home Medications Prior to Admission medications   Medication Sig Start Date End Date Taking? Authorizing Provider  acetaminophen  (TYLENOL ) 500 MG tablet Take 1,000 mg by mouth every 8 (eight) hours as needed for moderate pain.    [provider]  aspirin  EC 81 MG tablet Take 81 mg by mouth daily. Swallow whole.    [provider]  atorvastatin  (LIPITOR) 20 MG tablet Take 1 tablet (20 mg total) by mouth daily. 01/23/23   [name], MD  Cholecalciferol  (VITAMIN D3 PO) Take 2,000 Units by mouth daily.    [provider]  cyanocobalamin  (VITAMIN B12) 500 MCG tablet Take 500 mcg by mouth daily.    [provider]  dexamethasone  (DECADRON ) 4 MG tablet PLEASE TAKE 1 TABLET TWICE A DAY THE DAY BEFORE, THE DAY OF, AND THE DAY AFTER CHEMOTHERAPY Patient taking differently: Take 4 mg by mouth See admin instructions. Please take 1 tablet twice a day the day before, the day of, and the day after chemotherapy 08/08/23   Heilingoetter, Cassandra L, PA-C  diphenoxylate -atropine  (LOMOTIL ) 2.5-0.025 MG tablet Take 2 tablets by mouth 4 (four) times daily. 08/21/23   Kenney Peacemaker, MD  ferrous sulfate  325 (65 FE) MG EC tablet Take 1 tablet (325 mg total) by mouth daily with breakfast. 09/04/23 12/03/23  Maggie Schooner, MD  folic acid  (FOLVITE ) 1 MG tablet TAKE 1 TABLET BY MOUTH EVERY DAY 08/08/23   Marlene Simas, MD  lactobacillus acidophilus (BACID) TABS tablet Take 2 tablets by mouth daily at  12 noon.    [provider]  lidocaine  (XYLOCAINE ) 5 % ointment Apply 1 Application topically 3 (three) times daily as needed for mild pain or moderate pain. 08/15/22   Pickenpack-Cousar, Giles Labrum, NP  mirtazapine  (REMERON  SOL-TAB) 30 MG disintegrating tablet TAKE 1 TABLET BY MOUTH EVERYDAY AT BEDTIME Patient taking differently: Take 30 mg by mouth at bedtime. 08/08/23   Kenney Peacemaker, MD  Multiple Vitamins-Minerals (PRESERVISION AREDS 2) CAPS Take 1 capsule by mouth 2 (two) times daily.    [provider]  Ondansetron  HCl (ZOFRAN  PO) Take 1 tablet by mouth as needed.    [provider]  pantoprazole  (PROTONIX ) 40 MG tablet Take 1 tablet (40 mg total) by mouth 2 (two) times daily before a meal. 09/04/23   Amin, Ankit C, MD  prochlorperazine  (COMPAZINE ) 10 MG tablet Take 10 mg by mouth as needed for nausea or vomiting.    [provider]  sucralfate  (CARAFATE ) 1 GM/10ML suspension Take 10 mLs (1 g total) by mouth 2 (two) times daily for 14 days. 09/04/23 09/18/23  Maggie Schooner, MD      Allergies    Patient has no known allergies.    Review of Systems   Review of Systems A 10 point review of systems was performed and is negative unless otherwise reported in HPI.  Physical Exam Updated Vital Signs BP 121/66   Pulse 85   Temp 98.6 F (37 C) (Oral)   Resp 16   Ht 5\' 8"  (1.727 m)   Wt 59.9 kg   SpO2 94%   BMI 20.07 kg/m  Physical Exam General: Thin, generally weak appearing male, lying in bed.  HEENT: PERRLA, Sclera anicteric, dry mucous membranes, trachea midline.  Cardiology: RRR, no murmurs/rubs/gallops.  Resp: Normal respiratory rate and effort. CTAB, no wheezes, rhonchi, crackles.  Abd: Soft, non-tender, non-distended. No rebound tenderness or guarding.  GU: Deferred. MSK: No peripheral edema or signs of trauma. Extremities without deformity or TTP. No cyanosis or clubbing. Skin: warm, dry.  Back: No CVA tenderness Neuro: A&Ox4, CNs II-XII  grossly intact. MAEs. Sensation grossly intact.  Psych: Normal mood and affect.   ED Results / Procedures / Treatments   Labs (all labs ordered are listed, but only abnormal results are displayed) Labs Reviewed  CBC WITH DIFFERENTIAL/PLATELET - Abnormal; Notable for the following components:      Result Value   RBC 3.50 (*)    Hemoglobin 10.5 (*)    HCT 33.8 (*)    RDW 17.2 (*)    Neutro Abs 9.3 (*)    Lymphs Abs 0.5 (*)    All other components within normal limits  COMPREHENSIVE METABOLIC PANEL WITH GFR - Abnormal; Notable for the following components:   Chloride 97 (*)    Glucose, Bld 171 (*)    BUN 28 (*)    Creatinine, Ser 0.60 (*)    Albumin  2.5 (*)    ALT 49 (*)    Alkaline Phosphatase 136 (*)    All other components within normal limits  URINALYSIS, ROUTINE W REFLEX MICROSCOPIC - Abnormal; Notable for the following components:   Color, Urine COLORLESS (*)    Specific Gravity, Urine 1.000 (*)    All other components within normal limits  CULTURE, BLOOD (ROUTINE X 2)  CULTURE, BLOOD (ROUTINE X 2)  TYPE AND SCREEN    EKG None  Radiology DG Chest Portable 1 View Result Date: 09/18/2023 CLINICAL DATA:  Weakness, tachycardia EXAM: PORTABLE CHEST 1 VIEW COMPARISON:  January 03, 2023 FINDINGS: No change compared with prior examination. No change in the left suprahilar aortic pulmonic density. Which correlates with the prior CT chronic consolidation and atelectasis. No acute infiltrates consolidations or pulmonary edema Right IJ Infusaport catheter in good position IMPRESSION: *No change compared with prior examination. *No change in the left suprahilar aortic pulmonic density. Which correlates with the prior CT chronic consolidation and atelectasis. No acute cardiopulmonary disease Electronically Signed   By: Fredrich Jefferson M.D.   On: 09/18/2023 14:47    Procedures Procedures    Medications Ordered in ED Medications  lactated ringers  bolus 1,000 mL (0 mLs Intravenous  Stopped 09/18/23 1245)  lactated ringers  bolus 1,000 mL (0 mLs Intravenous Stopped 09/18/23 1418)    ED Course/ Medical Decision Making/ A&P                          Medical Decision Making Amount and/or Complexity of Data Reviewed Labs: ordered. Decision-making details documented in ED Course. Radiology: ordered. Decision-making details documented in ED Course.    This patient presents to the ED for concern of gen weakness, soft pressure/tachycardia, this involves an extensive number of treatment options, and is a complaint that carries with it a high risk of complications and morbidity.  I considered the following differential and admission for this acute, potentially life threatening condition. Mildly tachycardic but stable BP and afebrile on arrival.   MDM:    DDX for generalized weakness includes but is not limited to:  He is afebrile and with no localizing infectious symptoms. He does not have leukocytosis but has left shift. His pressure here is wnl. Will draw blood cultures but patient doesn't meet sepsis criteria. He has no CP/SOB to indicate ACS. No report of rectal bleeding, and Hgb is stable. UA with no infection.   Clinical Course as of 09/18/23 1531  Wed Sep 18, 2023  1233 WBC: 10.4 No leukocytosis but +left shift and increased from BL 4-7 [HN]  1454 DG Chest Portable 1 View *No change compared with prior examination. *No change in the left suprahilar aortic pulmonic density. Which correlates with the prior CT chronic consolidation and atelectasis. No acute cardiopulmonary disease   [HN]  1531 Patient reevaluated. His HR in 80s, BP in 120s after 2L IVF. He states he feels improved and has been dozing in the ED. UA dilute but obtained after 2L IVF. Likely patient was dehydrated. Recommended increased fluid intake and f/u with PCP. Discussed return precautions. DC w/ discharge instructions/return precautions. All questions answered to patient's satisfaction.   [HN]     Clinical Course User Index [HN] Drury Geralds,  Vana Gelineau, MD    Labs: I Ordered, and personally interpreted labs.  The pertinent results include:  those listed above  Imaging Studies ordered: I ordered imaging studies including CXR I independently visualized and interpreted imaging. I agree with the radiologist interpretation  Additional history obtained from chart review, wife at bedside.   Cardiac Monitoring: The patient was maintained on a cardiac monitor.  I personally viewed and interpreted the cardiac monitored which showed an underlying rhythm of: NSR  Reevaluation: After the interventions noted above, I reevaluated the patient and found that they have :improved  Social Determinants of Health: Lives independently  Disposition:  DC  Co morbidities that complicate the patient evaluation  Past Medical History:  Diagnosis Date   Anemia    Cancer (HCC)    bladder cancer   COPD (chronic obstructive pulmonary disease) (HCC)    mild    DIVERTICULOSIS, COLON 01/14/2007   Qualifier: Diagnosis of  By: Rachell Budge, RN, Arlena Belts    GASTRITIS, CHRONIC 06/01/2004   Qualifier: Diagnosis of  By: Tereasa Felty CMA (AAMA), Robin     GERD 01/09/2007   Qualifier: Diagnosis of  By: Ena Harries, RN, Ellen     Heart murmur    as a child; no murmur heard 12/14/19   History of radiation therapy    Mediastinum- 05/15/22-06/28/22-Dr. Retta Caster   HYPERLIPIDEMIA 01/09/2007   Qualifier: Diagnosis of  By: Ena Harries RN, Willetta Harpin     Hypertension    Lung mass    left upper nodule   Macular degeneration    right eye   NEOPLASM, MALIGNANT, BLADDER, HX OF 2002   Qualifier: Diagnosis of  By: Tereasa Felty CMA (AAMA), Robin  / no chemo or radiation   OSTEOARTHRITIS 01/14/2007   Qualifier: Diagnosis of  By: Rachell Budge, RN, Arlena Belts - knees   Rectal mass    Reflux esophagitis 06/01/2004   Qualifier: Diagnosis of  By: Tereasa Felty CMA Nonie Beady), Robin     Stroke [facility])    2024   TOBACCO USER 02/16/2009   Qualifier: Diagnosis of  By:  Minnette Amato  MD, Ronie Cohen    VITAMIN D  DEFICIENCY 06/03/2007   Qualifier: Diagnosis of  By: Minnette Amato  MD, Ronie Cohen      Medicines Meds ordered this encounter  Medications   lactated ringers  bolus 1,000 mL   lactated ringers  bolus 1,000 mL    I have reviewed the patients home medicines and have made adjustments as needed  Problem List / ED Course: Problem List Items Addressed This Visit   None Visit Diagnoses       Dehydration    -  Primary     Generalized weakness                       This note was created using dictation software, which may contain spelling or grammatical errors.    Merdis Stalling, MD 09/18/23 [phone]

## 2023-09-18 NOTE — ED Notes (Signed)
 SPOKE WITH DR NAASZ ,ABOUT BLADDER SCAN AND WAS TOLD THAT SHE NO LONGER NEEDED

## 2023-09-18 NOTE — Discharge Instructions (Signed)
 Thank you for coming to [facility] Emergency Department. You were seen for generalized weakness, fast heart rate and low blood pressure. We did an exam, labs, and imaging, and these showed likely dehydration. Please stay well hydrated at home and drink 60 oz of water  per day. Please follow up with your primary care provider within 1 week.   Do not hesitate to return to the ED or call 911 if you experience: -Worsening symptoms -Chest pain, shortness of breath -nausea/vomiting/diarrhea so severe you cannot keep fluid down -Lightheadedness, passing out -Fevers/chills -Anything else that concerns you

## 2023-09-18 NOTE — ED Notes (Signed)
 Urinal left at bedside pt and wife aware urine sample is needed

## 2023-09-18 NOTE — Progress Notes (Signed)
 Jesse [name] 73 y.o. 10-23-1950 [id number]  Assessment & Plan:   Encounter Diagnoses  Name Primary?   Weight loss Yes   Tachycardia    Diarrhea, unspecified type    Failure to thrive  in adult    Anorexia    As I advised today, the patient went to the emergency room where he had workup and was treated with 2 L of IV fluids and his pulse went to 80.  He felt much better and was discharged.  This is a difficult situation.  The diarrhea is better and seems symptomatically controlled.  I get the sense that he is depressed based upon the brief questioning today.  I have suggested he explore further treatment of depression through primary care.  He is hypersomnolent, may need to reduce the mirtazapine  dose though I think the hypersomnolence could be from depression itself.  The possibility of a gastrostomy tube was broached by the patient and wife.  There is ambivalence about this.  I do not have a good way to do 1 of those in the outpatient setting.  If oncology thinks this is reasonable would consult IR.  I do think he is malnourished or getting there and he needs more nutritional intake.  Hemoglobin was 10.5.  BUN was 28 creatinine 0.6.  His albumin  was 2.5 which is lower.  CC: [name], MD Dr. Candyce Champagne Dr. Marlene Simas Subjective:  The patient consented to the use of artificial intelligence scribe software.   Chief Complaint: Discuss possible ileostomy  HPI Jesse [name] "Jesse [name]" is a 73 year old male who presents with persistent diarrhea, poor appetite, and weight loss.  His wife is present and participates.  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  EGD was performed for therapy, see below for details.  Diarrhea has persisted for  about three months, initially severe but now improved with Lomotil , taken as two pills four times a day. This initially caused constipation, but bowel movements are now more controlled.  The Lomotil  was recommended by Dr. Hershell Lose of surgery who saw the patient earlier this month and diagnosed low anterior resection syndrome.  The patient had been on Lomotil  in the past but would use it as needed.  The patient and his wife are not inclined to pursue an ileostomy as they were previously.  Appetite remains poor despite the use of mirtazapine  30 mg at night. He relies on high-calorie nutritional drinks like Ensure due to poor food intake and altered taste. He has lost approximately 10 pounds over the past three months, with weight decreasing from 134 pounds in March to 120 pounds currently.  He experiences significant fatigue and weakness, limiting physical activity. Occasional dizziness occurs upon standing. There are no recent episodes of bleeding or vomiting blood.   He does feel very weak.  There is significant lack of interest in pursuing activities and appetite is quite variable.  There is hypersomnolence.  This seems more so than what he had experienced before with his mirtazapine  dose.  Patient and wife asking what else could be done to stimulate appetite.     Latest Ref Rng & Units 09/10/2023    1:29 PM 09/04/2023    3:11 AM 09/03/2023    4:59 PM  CBC  WBC  4.0 - 10.5 K/uL 4.5  6.8  7.0   Hemoglobin 13.0 - 17.0 g/dL 16.1  9.2  09.6   Hematocrit 39.0 - 52.0 % 31.0  28.2  33.5   Platelets 150 - 400 K/uL 307  169  190      Chemistry      Component Value Date/Time   NA 137 09/10/2023 1329   K 3.8 09/10/2023 1329   CL 99 09/10/2023 1329   CO2 31 09/10/2023 1329   BUN 22 09/10/2023 1329   CREATININE 0.47 (L) 09/10/2023 1329      Component Value Date/Time   CALCIUM  9.2 09/10/2023 1329   ALKPHOS 121 09/10/2023 1329   AST 41 09/10/2023 1329   ALT 51 (H) 09/10/2023 1329   BILITOT 0.3  09/10/2023 1329    Albumin  is 3   EGD 09/03/23 - Congested, texture changed mucosa in the esophagus. Biopsied. - Non-obstructing Schatzki ring. - No gross lesions in the entire esophagus. - Z-line irregular, 42 cm from the incisors. - 2 cm hiatal hernia. 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 Ablated with APC. - Erythematous mucosa in the stomach. Biopsied. - Duodenal bulb AVM. Ablated with APC. - No other gross lesions in the duodenal bulb, in the first portion of the duodenum and in the second portion of the duodenum. - Normal major papilla. A. STOMACH ESOPHAGEAL BIOPSY:  Gastric antral and oxyntic mucosa with slight chronic inflammation.  Negative for Helicobacter pylori.  Unremarkable squamous mucosa with mild hyperemia.  Negative for eosinophilic esophagitis.   Saw Lyndell Sanfilippo 08/27/23 - low anterior resection syndrome "Rectal: Refused but then relented. He has fecal soiling and moderate pruritus. He has stool leaking out the anus that is well-formed. Barely tolerates digital exam. He has no stricturing or narrowing. Sphincter tone is decreased but he has a partial squeeze. I feel no disruption of his coloanal anastomosis. "  Wt Readings from Last 3 Encounters:  09/18/23 123 lb 5 oz (55.9 kg)  09/10/23 125 lb 4.8 oz (56.8 kg)  09/05/23 125 lb (56.7 kg)  07/09/23 134#   Other recent GI history 05/13/2023 office visit with Dr. Artis Binder that time reviewed complications of left inguinal hernia, gastrostomy tube was removed 10/03/2022 colonoscopy with anal stricture, related to prior resection of rectal GIST, diverticulosis in the sigmoid colon otherwise normal 07/04/2022 EGD done for dysphagia and malnutrition, no abnormality but PEG tube was placed No Known Allergies Current Meds  Medication Sig   acetaminophen  (TYLENOL ) 500 MG tablet Take 1,000 mg by mouth every 8 (eight) hours as needed for moderate pain.   aspirin  EC 81 MG tablet Take 81 mg by mouth daily. Swallow whole.    atorvastatin  (LIPITOR) 20 MG tablet Take 1 tablet (20 mg total) by mouth daily.   Cholecalciferol  (VITAMIN D3 PO) Take 2,000 Units by mouth daily.   cyanocobalamin  (VITAMIN B12) 500 MCG tablet Take 500 mcg by mouth daily.   dexamethasone  (DECADRON ) 4 MG tablet PLEASE TAKE 1 TABLET TWICE A DAY THE DAY BEFORE, THE DAY OF, AND THE DAY AFTER CHEMOTHERAPY (Patient taking differently: Take 4 mg by mouth See admin instructions. Please take 1 tablet twice a day the day before, the day of, and the day after chemotherapy)   diphenoxylate -atropine  (LOMOTIL ) 2.5-0.025 MG tablet Take 2 tablets by mouth 4 (four) times daily.   ferrous sulfate  325 (65 FE) MG EC tablet Take 1 tablet (325 mg total) by mouth daily with breakfast.   folic acid  (FOLVITE ) 1 MG tablet  TAKE 1 TABLET BY MOUTH EVERY DAY   lactobacillus acidophilus (BACID) TABS tablet Take 2 tablets by mouth daily at 12 noon.   lidocaine  (XYLOCAINE ) 5 % ointment Apply 1 Application topically 3 (three) times daily as needed for mild pain or moderate pain.   mirtazapine  (REMERON  SOL-TAB) 30 MG disintegrating tablet TAKE 1 TABLET BY MOUTH EVERYDAY AT BEDTIME (Patient taking differently: Take 30 mg by mouth at bedtime.)   Multiple Vitamins-Minerals (PRESERVISION AREDS 2) CAPS Take 1 capsule by mouth 2 (two) times daily.   Ondansetron  HCl (ZOFRAN  PO) Take 1 tablet by mouth as needed.   pantoprazole  (PROTONIX ) 40 MG tablet Take 1 tablet (40 mg total) by mouth 2 (two) times daily before a meal.   prochlorperazine  (COMPAZINE ) 10 MG tablet Take 10 mg by mouth as needed for nausea or vomiting.   sucralfate  (CARAFATE ) 1 GM/10ML suspension Take 10 mLs (1 g total) by mouth 2 (two) times daily for 14 days.   Past Medical History:  Diagnosis Date   Anemia    Cancer (HCC)    bladder cancer   COPD (chronic obstructive pulmonary disease) (HCC)    mild    DIVERTICULOSIS, COLON 01/14/2007   Qualifier: Diagnosis of  By: Rachell Budge RN, Arlena Belts    GASTRITIS, CHRONIC  06/01/2004   Qualifier: Diagnosis of  By: Tereasa Felty CMA (AAMA), Robin     GERD 01/09/2007   Qualifier: Diagnosis of  By: Ena Harries, RN, Ellen     Heart murmur    as a child; no murmur heard 12/14/19   History of radiation therapy    Mediastinum- 05/15/22-06/28/22-Dr. Retta Caster   HYPERLIPIDEMIA 01/09/2007   Qualifier: Diagnosis of  By: Ena Harries RN, Willetta Harpin     Hypertension    Lung mass    left upper nodule   Macular degeneration    right eye   NEOPLASM, MALIGNANT, BLADDER, HX OF 2002   Qualifier: Diagnosis of  By: Tereasa Felty CMA (AAMA), Robin  / no chemo or radiation   OSTEOARTHRITIS 01/14/2007   Qualifier: Diagnosis of  By: Rachell Budge, RN, Arlena Belts - knees   Rectal mass    Reflux esophagitis 06/01/2004   Qualifier: Diagnosis of  By: Tereasa Felty CMA Nonie Beady), Robin     Stroke [facility])    2024   TOBACCO USER 02/16/2009   Qualifier: Diagnosis of  By: Minnette Amato  MD, Ronie Cohen    VITAMIN D  DEFICIENCY 06/03/2007   Qualifier: Diagnosis of  By: Minnette Amato  MD, Ronie Cohen    Past Surgical History:  Procedure Laterality Date   BIOPSY  06/18/2022   Procedure: BIOPSY;  Surgeon: Annis Kinder, DO;  Location: [facility];  Service: Gastroenterology;;   BIOPSY OF SKIN SUBCUTANEOUS TISSUE AND/OR MUCOUS MEMBRANE  09/03/2023   Procedure: BIOPSY, SKIN, SUBCUTANEOUS TISSUE, OR MUCOUS MEMBRANE;  Surgeon: Normie Becton., MD;  Location: [facility];  Service: Gastroenterology;;   BREAST BIOPSY Left 03/16/2022   US  LT RADIOACTIVE SEED LOC 03/16/2022 [facility]   BRONCHIAL BIOPSY  10/13/2019   Procedure: BRONCHIAL BIOPSIES;  Surgeon: Prudy Brownie, DO;  Location: [facility];  Service: Pulmonary;;   BRONCHIAL BIOPSY  12/04/2022   Procedure: BRONCHIAL BIOPSIES;  Surgeon: Prudy Brownie, DO;  [facility]: [facility];  Service: Pulmonary;;   BRONCHIAL BRUSHINGS  10/13/2019   Procedure: BRONCHIAL BRUSHINGS;  Surgeon: Prudy Brownie, DO;  [facility];  Service: Pulmonary;;   BRONCHIAL NEEDLE  ASPIRATION BIOPSY  10/13/2019   Procedure: BRONCHIAL NEEDLE ASPIRATION BIOPSIES;  Surgeon: Thelda Finney,  Lucie Ruts, DO;  Location: [facility];  Service: Pulmonary;;   BRONCHIAL NEEDLE ASPIRATION BIOPSY  04/11/2022   Procedure: BRONCHIAL NEEDLE ASPIRATION BIOPSIES;  Surgeon: Prudy Brownie, DO;  [facility]: [facility];  Service: Cardiopulmonary;;   BRONCHIAL NEEDLE ASPIRATION BIOPSY  12/04/2022   Procedure: BRONCHIAL NEEDLE ASPIRATION BIOPSIES;  Surgeon: Prudy Brownie, DO;  [facility];  Service: Pulmonary;;   BRONCHIAL WASHINGS  10/13/2019   Procedure: BRONCHIAL WASHINGS;  Surgeon: Prudy Brownie, DO;  [facility];  Service: Pulmonary;;   BRONCHIAL WASHINGS  12/04/2022   Procedure: BRONCHIAL WASHINGS;  Surgeon: Prudy Brownie, DO;  [facility];  Service: Pulmonary;;   COLONOSCOPY     multiple   DIVERTING ILEOSTOMY N/A 12/14/2021   Procedure: DIVERTING ILEOSTOMY;  Surgeon: Candyce Champagne, MD;  [facility]: [facility];  Service: General;  Laterality: N/A;   ENDOBRONCHIAL ULTRASOUND Bilateral 04/11/2022   Procedure: ENDOBRONCHIAL ULTRASOUND;  Surgeon: Prudy Brownie, DO;  [facility]: [facility];  Service: Cardiopulmonary;  Laterality: Bilateral;   ESOPHAGEAL DILATION  06/18/2022   Procedure: ESOPHAGEAL DILATION;  Surgeon: Annis Kinder, DO;  [facility]: [facility];  Service: Gastroenterology;;   ESOPHAGOGASTRODUODENOSCOPY N/A 09/03/2023   Procedure: EGD (ESOPHAGOGASTRODUODENOSCOPY);  Surgeon: Normie Becton., MD;  Location: [facility];  Service: Gastroenterology;  Laterality: N/A;   ESOPHAGOGASTRODUODENOSCOPY (EGD) WITH PROPOFOL  N/A 06/18/2022   Procedure: ESOPHAGOGASTRODUODENOSCOPY (EGD) WITH PROPOFOL ;  Surgeon: Annis Kinder, DO;  [facility];  Service: Gastroenterology;  Laterality: N/A;  Severe dysphagia/odynophagia ?radiation esophagitis versus oral candidiasis   FIDUCIAL MARKER PLACEMENT  12/04/2022   Procedure: FIDUCIAL MARKER PLACEMENT;   Surgeon: Prudy Brownie, DO;  [facility];  Service: Pulmonary;;   ILEOSTOMY CLOSURE N/A 03/22/2022   Procedure: OPEN TAKEDOWN OF LOOP ILEOSTOMY;  Surgeon: Candyce Champagne, MD;  [facility]: [facility];  Service: General;  Laterality: N/A;  GEN w/ERAS PATHWAY LOCAL   INTERCOSTAL NERVE BLOCK Left 12/16/2019   Procedure: INTERCOSTAL NERVE BLOCK;  Surgeon: Hilarie Lovely, MD;  [facility];  Service: Thoracic;  Laterality: Left;   IR FLUORO RM 30-60 MIN  07/02/2022   IR IMAGING GUIDED PORT INSERTION  05/21/2023   KNEE SURGERY  07/20/2008   bilat.   NASAL SEPTUM SURGERY  1995   NODE DISSECTION Left 12/16/2019   Procedure: NODE DISSECTION;  Surgeon: Hilarie Lovely, MD;  [facility];  Service: Thoracic;  Laterality: Left;   PEG PLACEMENT N/A 07/04/2022   Procedure: PERCUTANEOUS ENDOSCOPIC GASTROSTOMY (PEG) PLACEMENT;  Surgeon: Kenney Peacemaker, MD;  [facility];  Service: Gastroenterology;  Laterality: N/A;   RADIOACTIVE SEED GUIDED AXILLARY SENTINEL LYMPH NODE Left 03/20/2022   Procedure: RADIOACTIVE SEED GUIDED LEFT AXILLARY SENTINEL LYMPH NODE BIOPSY;  Surgeon: Sim Dryer, MD;  [facility];  Service: General;  Laterality: Left;   RECTAL EXAM UNDER ANESTHESIA N/A 03/22/2022   Procedure: ANORECTAL EXAMINATION UNDER ANESTHESIA;  Surgeon: Candyce Champagne, MD;  [facility]: [facility];  Service: General;  Laterality: N/A;   TONSILLECTOMY     TRANSURETHRAL RESECTION OF BLADDER TUMOR  2002   UPPER GASTROINTESTINAL ENDOSCOPY     VIDEO BRONCHOSCOPY  04/11/2022   Procedure: VIDEO BRONCHOSCOPY;  Surgeon: Prudy Brownie, DO;  Location: [facility];  Service: Cardiopulmonary;;   VIDEO BRONCHOSCOPY WITH ENDOBRONCHIAL NAVIGATION N/A 10/13/2019   Procedure: VIDEO BRONCHOSCOPY WITH ENDOBRONCHIAL NAVIGATION;  Surgeon: Prudy Brownie, DO;  [facility];  Service: Pulmonary;  Laterality: N/A;   WISDOM TOOTH EXTRACTION  XI ROBOTIC ASSISTED LOWER ANTERIOR RESECTION N/A 12/14/2021    Procedure: XI ROBOTIC ASSISTED LOWER ANTERIOR ULTRA LOW RECTOSIGMOID RESECTION, COLOANAL HAND SEWN ANASTOMOSIS, AND BILATERAL TAP BLOCK;  Surgeon: Candyce Champagne, MD;  Location: [facility];  Service: General;  Laterality: N/A;   Social History   Social History Narrative   Married   3 children: 3 grandchildren; 3 great grandchildren   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   Review of Systems As per HPI  Objective:   Physical Exam BP 92/70   Pulse (!) 131   Ht 5\' 8"  (1.727 m)   Wt 123 lb 5 oz (55.9 kg)   BMI 18.75 kg/m  Recheck pulse 120 Chronically ill, asthenic Lungs w/ decreased BS at Left base Cor tachy s1s2 no murmur Abd thin sft NT - soft and reducible left inguinal hernia

## 2023-09-18 NOTE — Patient Instructions (Signed)
 Please go to [facility] ED from here.   _______________________________________________________  If your blood pressure at your visit was 140/90 or greater, please contact your primary care physician to follow up on this.  _______________________________________________________  If you are age 74 or older, your body mass index should be between 23-30. Your Body mass index is 18.75 kg/m. If this is out of the aforementioned range listed, please consider follow up with your Primary Care Provider.  If you are age 14 or younger, your body mass index should be between 19-25. Your Body mass index is 18.75 kg/m. If this is out of the aformentioned range listed, please consider follow up with your Primary Care Provider.   ________________________________________________________  The [place] [facility] providers would like to encourage you to use MYCHART to communicate with providers for non-urgent requests or questions.  Due to long hold times on the telephone, sending your provider a message by [facility] may be a faster and more efficient way to get a response.  Please allow 48 business hours for a response.  Please remember that this is for non-urgent requests.  _______________________________________________________   I appreciate the opportunity to care for you. Loy Ruff, MD, [facility]

## 2023-09-18 NOTE — ED Triage Notes (Addendum)
 Pt c/o chronic weakness and abnormal vital signs.  Pt was tachycardic(130s) and slightly hypotensive at GI office today (92/70).  Hx lung CA.  Last chemo 4/29.  Denies GI and GU complaints.

## 2023-09-19 ENCOUNTER — Encounter: Payer: Self-pay | Admitting: Family Medicine

## 2023-09-19 ENCOUNTER — Ambulatory Visit (INDEPENDENT_AMBULATORY_CARE_PROVIDER_SITE_OTHER): Admitting: Family Medicine

## 2023-09-19 VITALS — BP 102/74 | HR 112 | Temp 98.0°F | Ht 68.0 in | Wt 126.6 lb

## 2023-09-19 DIAGNOSIS — E860 Dehydration: Secondary | ICD-10-CM

## 2023-09-19 DIAGNOSIS — Z09 Encounter for follow-up examination after completed treatment for conditions other than malignant neoplasm: Secondary | ICD-10-CM

## 2023-09-19 DIAGNOSIS — D649 Anemia, unspecified: Secondary | ICD-10-CM | POA: Diagnosis not present

## 2023-09-19 DIAGNOSIS — E43 Unspecified severe protein-calorie malnutrition: Secondary | ICD-10-CM | POA: Diagnosis not present

## 2023-09-19 DIAGNOSIS — R0602 Shortness of breath: Secondary | ICD-10-CM | POA: Diagnosis not present

## 2023-09-19 DIAGNOSIS — K5520 Angiodysplasia of colon without hemorrhage: Secondary | ICD-10-CM

## 2023-09-19 NOTE — Progress Notes (Signed)
 Established Patient Office Visit   Subjective  Patient ID: Jesse [name], male    DOB: 1950-08-06  Age: 73 y.o. MRN: [id number]  Chief Complaint  Patient presents with   Medical Management of Chronic Issues    Hospital follow-up anemia and dehydration, seen 6/4 and 5/19, B-12 is high,     Jesse [name] "Jesse [name]" is a 73 year old male who presents with ongoing issues related to anemia, dehydration, and poor appetite.  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 He feels very tired, experiences shortness of breath, and has difficulty with daily activities. Despite being out of the hospital since March of last year, he feels he is not improving.  He was recently seen in the emergency department for dehydration. He acknowledges not being a big water  drinker and has been trying to increase his water  intake. Over the last couple of months, he has been drinking more water , though not as much as recommended.  He reports a poor appetite and a general dislike for food, stating 'I really don't like food.' He consumes protein drinks and eats whatever he can, although he finds many foods unappealing. He eats some fruit, such as apricots and raspberries from his garden, and occasionally sweets. He dislikes water  and other hydration options like Gatorade and prefers not to use chemical additives.  He continues to experience shortness of breath, which has remained unchanged. He was supposed to be contacted regarding oxygen therapy but is unsure if he missed the communication. His caregiver reports that his heart rate was elevated during a previous hospital visit.  He is currently taking mirtazapine , which was increased from 15 mg to 30 mg by his oncologist, but it does not seem to be helping with his appetite. He sleeps a lot, approximately 16-18 hours a day, and feels tired  despite this. He has not been diagnosed with depression, although symptoms have been discussed previously.  No changes in mood, stating 'I'm fine. I'm tired.' He has difficulty breathing at times and experiences a racing heart, particularly when dehydrated.    Patient Active Problem List   Diagnosis Date Noted   AVM (arteriovenous malformation) of stomach, acquired 09/04/2023   AVM (arteriovenous malformation) of small bowel, acquired 09/04/2023   Acute on chronic anemia 09/04/2023   Acute GI bleeding 09/02/2023   Symptomatic anemia 09/02/2023   History of stroke 09/02/2023   COPD (chronic obstructive pulmonary disease) (HCC)    Port-A-Cath in place 05/28/2023   Recurrent non-small cell lung cancer (HCC) 03/05/2023   Acute ischemic stroke, likely embolic 07/02/2022   Esophageal dysphagia 06/18/2022   Esophageal stricture 06/18/2022   Hiatal hernia 06/18/2022   Radiation-induced esophagitis 06/18/2022   Pancytopenia due to chemotherapy 06/07/2022   Protein-calorie malnutrition, severe 06/07/2022   AKI (acute kidney injury) (HCC) 06/05/2022   Hyponatremia 06/05/2022   Acute metabolic encephalopathy 06/05/2022   Acute respiratory failure with hypoxia (HCC) 06/05/2022   Acute on chronic diarrhea 06/05/2022   Weight loss 05/28/2022   Malignant neoplasm of lung (HCC) 04/30/2022   Adenopathy 04/06/2022   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   Encounter for antineoplastic chemotherapy 01/28/2020   Adenocarcinoma of left lung, stage 2 (HCC) 12/31/2019   Goals of care, counseling/discussion 12/31/2019   S/P lobectomy of lung 12/16/2019   Lung nodule 10/13/2019   Essential hypertension 09/28/2016   Coronary artery calcification  09/28/2016   Impaired glucose tolerance 09/28/2016   Vitamin D  deficiency 06/03/2007   Diverticulosis of colon 01/14/2007   Dyslipidemia 01/09/2007   GERD 01/09/2007   REFLUX ESOPHAGITIS 06/01/2004   Atrophic gastritis  06/01/2004   Past Medical History:  Diagnosis Date   Anemia    Cancer (HCC)    bladder cancer   COPD (chronic obstructive pulmonary disease) (HCC)    mild    DIVERTICULOSIS, COLON 01/14/2007   Qualifier: Diagnosis of  By: Rachell Budge RN, Arlena Belts    GASTRITIS, CHRONIC 06/01/2004   Qualifier: Diagnosis of  By: Tereasa Felty CMA (AAMA), Robin     GERD 01/09/2007   Qualifier: Diagnosis of  By: Ena Harries, RN, Ellen     Heart murmur    as a child; no murmur heard 12/14/19   History of radiation therapy    Mediastinum- 05/15/22-06/28/22-Dr. Retta Caster   HYPERLIPIDEMIA 01/09/2007   Qualifier: Diagnosis of  By: Ena Harries RN, Willetta Harpin     Hypertension    Lung mass    left upper nodule   Macular degeneration    right eye   NEOPLASM, MALIGNANT, BLADDER, HX OF 2002   Qualifier: Diagnosis of  By: Tereasa Felty CMA (AAMA), Robin  / no chemo or radiation   OSTEOARTHRITIS 01/14/2007   Qualifier: Diagnosis of  By: Rachell Budge, RN, Arlena Belts - knees   Rectal mass    Reflux esophagitis 06/01/2004   Qualifier: Diagnosis of  By: Tereasa Felty CMA Nonie Beady), Robin     Stroke [facility])    2024   TOBACCO USER 02/16/2009   Qualifier: Diagnosis of  By: Minnette Amato  MD, Ronie Cohen    VITAMIN D  DEFICIENCY 06/03/2007   Qualifier: Diagnosis of  By: Minnette Amato  MD, Ronie Cohen    Past Surgical History:  Procedure Laterality Date   BIOPSY  06/18/2022   Procedure: BIOPSY;  Surgeon: Annis Kinder, DO;  Location: [facility];  Service: Gastroenterology;;   BIOPSY OF SKIN SUBCUTANEOUS TISSUE AND/OR MUCOUS MEMBRANE  09/03/2023   Procedure: BIOPSY, SKIN, SUBCUTANEOUS TISSUE, OR MUCOUS MEMBRANE;  Surgeon: Normie Becton., MD;  Location: [facility];  Service: Gastroenterology;;   BREAST BIOPSY Left 03/16/2022   US  LT RADIOACTIVE SEED LOC 03/16/2022 [facility]   BRONCHIAL BIOPSY  10/13/2019   Procedure: BRONCHIAL BIOPSIES;  Surgeon: Prudy Brownie, DO;  Location: [facility];  Service: Pulmonary;;   BRONCHIAL BIOPSY  12/04/2022    Procedure: BRONCHIAL BIOPSIES;  Surgeon: Prudy Brownie, DO;  Location: [facility];  Service: Pulmonary;;   BRONCHIAL BRUSHINGS  10/13/2019   Procedure: BRONCHIAL BRUSHINGS;  Surgeon: Prudy Brownie, DO;  [facility]: [facility];  Service: Pulmonary;;   BRONCHIAL NEEDLE ASPIRATION BIOPSY  10/13/2019   Procedure: BRONCHIAL NEEDLE ASPIRATION BIOPSIES;  Surgeon: Prudy Brownie, DO;  [facility]: [facility];  Service: Pulmonary;;   BRONCHIAL NEEDLE ASPIRATION BIOPSY  04/11/2022   Procedure: BRONCHIAL NEEDLE ASPIRATION BIOPSIES;  Surgeon: Prudy Brownie, DO;  Location: [facility];  Service: Cardiopulmonary;;   BRONCHIAL NEEDLE ASPIRATION BIOPSY  12/04/2022   Procedure: BRONCHIAL NEEDLE ASPIRATION BIOPSIES;  Surgeon: Prudy Brownie, DO;  [facility];  Service: Pulmonary;;   BRONCHIAL WASHINGS  10/13/2019   Procedure: BRONCHIAL WASHINGS;  Surgeon: Prudy Brownie, DO;  [facility]: [facility];  Service: Pulmonary;;   BRONCHIAL WASHINGS  12/04/2022   Procedure: BRONCHIAL WASHINGS;  Surgeon: Prudy Brownie, DO;  [facility]: [facility];  Service: Pulmonary;;   COLONOSCOPY     multiple   DIVERTING ILEOSTOMY N/A 12/14/2021  Procedure: DIVERTING ILEOSTOMY;  Surgeon: Candyce Champagne, MD;  Location: [facility];  Service: General;  Laterality: N/A;   ENDOBRONCHIAL ULTRASOUND Bilateral 04/11/2022   Procedure: ENDOBRONCHIAL ULTRASOUND;  Surgeon: Prudy Brownie, DO;  Location: [facility];  Service: Cardiopulmonary;  Laterality: Bilateral;   ESOPHAGEAL DILATION  06/18/2022   Procedure: ESOPHAGEAL DILATION;  Surgeon: Annis Kinder, DO;  Location: [facility];  Service: Gastroenterology;;   ESOPHAGOGASTRODUODENOSCOPY N/A 09/03/2023   Procedure: EGD (ESOPHAGOGASTRODUODENOSCOPY);  Surgeon: Normie Becton., MD;  Location: [facility];  Service: Gastroenterology;  Laterality: N/A;   ESOPHAGOGASTRODUODENOSCOPY (EGD) WITH PROPOFOL  N/A 06/18/2022   Procedure: ESOPHAGOGASTRODUODENOSCOPY (EGD)  WITH PROPOFOL ;  Surgeon: Annis Kinder, DO;  Location: [facility];  Service: Gastroenterology;  Laterality: N/A;  Severe dysphagia/odynophagia ?radiation esophagitis versus oral candidiasis   FIDUCIAL MARKER PLACEMENT  12/04/2022   Procedure: FIDUCIAL MARKER PLACEMENT;  Surgeon: Prudy Brownie, DO;  [facility]: [facility];  Service: Pulmonary;;   ILEOSTOMY CLOSURE N/A 03/22/2022   Procedure: OPEN TAKEDOWN OF LOOP ILEOSTOMY;  Surgeon: Candyce Champagne, MD;  Location: [facility];  Service: General;  Laterality: N/A;  GEN w/ERAS PATHWAY LOCAL   INTERCOSTAL NERVE BLOCK Left 12/16/2019   Procedure: INTERCOSTAL NERVE BLOCK;  Surgeon: Hilarie Lovely, MD;  Location[facility];  Service: Thoracic;  Laterality: Left;   IR FLUORO RM 30-60 MIN  07/02/2022   IR IMAGING GUIDED PORT INSERTION  05/21/2023   KNEE SURGERY  07/20/2008   bilat.   NASAL SEPTUM SURGERY  1995   NODE DISSECTION Left 12/16/2019   Procedure: NODE DISSECTION;  Surgeon: Hilarie Lovely, MD;  Location: [facility];  Service: Thoracic;  Laterality: Left;   PEG PLACEMENT N/A 07/04/2022   Procedure: PERCUTANEOUS ENDOSCOPIC GASTROSTOMY (PEG) PLACEMENT;  Surgeon: Kenney Peacemaker, MD;  Location: [facility];  Service: Gastroenterology;  Laterality: N/A;   RADIOACTIVE SEED GUIDED AXILLARY SENTINEL LYMPH NODE Left 03/20/2022   Procedure: RADIOACTIVE SEED GUIDED LEFT AXILLARY SENTINEL LYMPH NODE BIOPSY;  Surgeon: Sim Dryer, MD;  [facility];  Service: General;  Laterality: Left;   RECTAL EXAM UNDER ANESTHESIA N/A 03/22/2022   Procedure: ANORECTAL EXAMINATION UNDER ANESTHESIA;  Surgeon: Candyce Champagne, MD;  Location: [facility];  Service: General;  Laterality: N/A;   TONSILLECTOMY     TRANSURETHRAL RESECTION OF BLADDER TUMOR  2002   UPPER GASTROINTESTINAL ENDOSCOPY     VIDEO BRONCHOSCOPY  04/11/2022   Procedure: VIDEO BRONCHOSCOPY;  Surgeon: Prudy Brownie, DO;  Location: [facility];  Service: Cardiopulmonary;;   VIDEO BRONCHOSCOPY WITH ENDOBRONCHIAL  NAVIGATION N/A 10/13/2019   Procedure: VIDEO BRONCHOSCOPY WITH ENDOBRONCHIAL NAVIGATION;  Surgeon: Prudy Brownie, DO;  Location: [facility];  Service: Pulmonary;  Laterality: N/A;   WISDOM TOOTH EXTRACTION     XI ROBOTIC ASSISTED LOWER ANTERIOR RESECTION N/A 12/14/2021   Procedure: XI ROBOTIC ASSISTED LOWER ANTERIOR ULTRA LOW RECTOSIGMOID RESECTION, COLOANAL HAND SEWN ANASTOMOSIS, AND BILATERAL TAP BLOCK;  Surgeon: Candyce Champagne, MD;  Location: [facility];  Service: General;  Laterality: N/A;   Social History   Tobacco Use   Smoking status: Former    Current packs/day: 0.00    Average packs/day: 0.5 packs/day for 40.0 years (20.0 ttl pk-yrs)    Types: Cigarettes    Start date: 09/21/1979    Quit date: 09/21/2019    Years since quitting: 3.9   Smokeless tobacco: Never  Vaping Use   Vaping status: Never Used  Substance Use Topics   Alcohol use: Not Currently    Alcohol/week: 15.0 standard  drinks of alcohol    Types: 15 Standard drinks or equivalent per week    Comment: wine/beer/martini   Drug use: Not Currently    Types: Marijuana    Comment: Smokes marijuana occasional, last use in 08/2019   Family History  Problem Relation Age of Onset   Dementia Mother    Diabetes Father    Mental illness Father    Peripheral vascular disease Brother    Colon cancer Neg Hx    Esophageal cancer Neg Hx    Stomach cancer Neg Hx    Rectal cancer Neg Hx    No Known Allergies  ROS Negative unless stated above    Objective:      BP 102/74 (BP Location: Left Arm, Patient Position: Sitting, Cuff Size: Normal)   Pulse (!) 112   Temp 98 F (36.7 C) (Oral)   Ht 5\' 8"  (1.727 m)   Wt 126 lb 9.6 oz (57.4 kg)   SpO2 98%   BMI 19.25 kg/m  BP Readings from Last 3 Encounters:  09/19/23 102/74  09/18/23 121/66  09/18/23 92/70   Wt Readings from Last 3 Encounters:  09/19/23 126 lb 9.6 oz (57.4 kg)  09/18/23 132 lb (59.9 kg)  09/18/23 123 lb 5 oz (55.9 kg)      Physical  Exam Constitutional:      General: He is awake. He is not in acute distress.    Appearance: He is underweight.  HENT:     Head: Normocephalic and atraumatic.     Nose: Nose normal.     Mouth/Throat:     Mouth: Mucous membranes are moist.  Cardiovascular:     Rate and Rhythm: Regular rhythm. Tachycardia present.     Heart sounds: Normal heart sounds. No murmur heard.    No gallop.  Pulmonary:     Effort: Pulmonary effort is normal. No respiratory distress.     Breath sounds: Normal breath sounds. No wheezing, rhonchi or rales.  Skin:    General: Skin is warm and dry.  Neurological:     Mental Status: He is alert and oriented to person, place, and time.  Psychiatric:        Behavior: Behavior is cooperative.        09/19/2023    3:25 PM 07/17/2023   10:04 AM 06/12/2023   10:04 AM  Depression screen PHQ 2/9  Decreased Interest 0 0 0  Down, Depressed, Hopeless 0 0 0  PHQ - 2 Score 0 0 0  Altered sleeping 0 0 0  Tired, decreased energy 0 1 0  Change in appetite 2  0  Feeling bad or failure about yourself  0 1 0  Trouble concentrating 1 0 0  Moving slowly or fidgety/restless 0 0 0  Suicidal thoughts 0 0 0  PHQ-9 Score 3 2 0  Difficult doing work/chores  Not difficult at all       09/19/2023    3:25 PM 07/17/2023   10:04 AM 06/12/2023   10:04 AM  GAD 7 : Generalized Anxiety Score  Nervous, Anxious, on Edge 2 0 0  Control/stop worrying 0 0 0  Worry too much - different things 0 0 0  Trouble relaxing 0 0 0  Restless 0 0 0  Easily annoyed or irritable 0 0 0  Afraid - awful might happen 0 0 0  Total GAD 7 Score 2 0 0  Anxiety Difficulty  Not difficult at all      No results found  for any visits on 09/19/23.    Assessment & Plan:   Hospital discharge follow-up  SOB (shortness of breath)  Protein-calorie malnutrition, severe  Gastrointestinal bleeding due to arteriovenous malformation (AVM)   He was recently hospitalized for gastric AVM bleeding, resulting in anemia  and fatigue. Focus on hemostasis and reducing gastric irritation. Monitor hemoglobin levels and encourage increased nutritional intake.  Anemia secondary to blood loss   Hgb 10.5, Hct 33.8 on 09/18/23 in ED.  Anemia from recent gastrointestinal bleeding due AVM likely contributing to fatigue and dizziness. Monitor hemoglobin levels and encourage increased nutritional intake, including protein drinks.  Shortness of breath   Persistent shortness of breath may be due to anemia. Previous attempts to contact for oxygen therapy were unsuccessful. Re-evaluate the need for oxygen therapy and check pulse oximetry levels.  normal this visit.  Dehydration   Recent dehydration from inadequate water  intake is causing fatigue and tachycardia. Encourage increased water  intake and consider using flavor additives to improve consumption.  Appetite loss   Persistent anorexia may be related to depression or medication side effects. Mirtazapine  is ineffective in stimulating appetite. Consider alternative medications that may stimulate appetite and address potential depression. Encourage consumption of high-calorie foods and protein drinks.  Depression   Concern for depression due to chronic illness. Pt denies feeling depressed.  PHQ 9 score 3 and GAD-7 score 2 this visit.  Mirtazapine  may be ineffective.   Would like to wean and see how pt feels.  Advised other medications for appetite stimulation not proven to be as effective in cachexia due to cancer and in older individuals.  Discussed trying a different medication such as zoloft.  Pt declines. Continue to monitor.  Return in about 2 weeks (around 10/03/2023), or if symptoms worsen or fail to improve.   [name], MD

## 2023-09-21 NOTE — Progress Notes (Unsigned)
 [name] OFFICE PROGRESS NOTE  Jesse Greulich, MD [address] [place] [address]  DIAGNOSIS: 1) recurrent non-small cell lung cancer, adenocarcinoma with positive mediastinal lymph node in December 2023 that was initially diagnosed as stage IIB (T1 a, N1, M0) non-small cell lung cancer, adenocarcinoma diagnosed in June 2021.  The patient also has evidence for recurrent disease in the mediastinal lymph node in January 2024 and in the right lower lobe in August 2024. 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   Biomarker Findings Tumor Mutational Burden - 20 Muts/Mb Microsatellite status - MS-Stable Genomic Findings For a complete list of the genes assayed, please refer to the Appendix. KEAP1 I444fs*17 DNMT3A E817* RB1 splice site 1960+5G>C TP53 R209* 8 Disease relevant genes with no reportable alterations: ALK, BRAF, EGFR, ERBB2, KRAS, MET, RET, ROS1   PDL1 Expression: 1 %  PRIOR THERAPY: 1) status post left upper lobectomy with lymph node dissection on December 16, 2019 under the care of Dr. Deloise Ferries.  The tumor size measured 0.9 cm but there was involvement of the level 10 L and 11 L.  2) Adjuvant systemic chemotherapy with cisplatin  75 mg/M2 and Alimta  500 mg/M2 every 3 weeks.  First dose February 04, 2020.  Status post 4 cycles. 3) Neoadjuvant treatment with imatinib  400 mg p.o. daily for the GIST tumor of the pelvic area.  First dose started July 18, 2020.  Status post 12 months of treatment. 4) status post robotic assisted very low anterior rectosigmoid resection with coloanal anastomosis, diverting loop ileostomy and wedge liver biopsy under the care of Dr. Hershell Lose on December 14, 2021 and it showed minimal residual gastrointestinal stromal tumor. 5) concurrent chemoradiation with weekly carboplatin  for AUC of 2 and paclitaxel  45 Mg/M2.  First dose May 14, 2022. 6) Imatinib  400 mg p.o. daily 7) Systemic chemotherapy with carboplatin  for AUC of  5 and Alimta  500 Mg/M2 every 3 weeks.  First dose 01/01/2023.  Status post 6 cycles.  CURRENT THERAPY: Maintenance treatment with single agent Alimta  500 Mg/M2, status post 6 cycles. His treatment will be on hold starting from 09/24/23. His most recent treatment being on 08/20/23.  INTERVAL HISTORY: [name] 73 y.o. male returns to the clinic today for a follow-up visit accompanied by his wife. The patient was last seen by myself two weeks ago.    The patient is currently undergoing single agent chemotherapy with Alimta . At his last appointment we discussed delaying two weeks due to his failure to thrive  and weakness.   He experiences ongoing weight loss, currently weighing 123 pounds. He has gastrointestinal absorption problems. He has difficulty maintaining nutrition because of frequent diarrhea and occasional vomiting, impacting his ability to gain weight. He consumes high-calorie nutritional supplements like Ensure to help with calorie intake. He takes ferrous sulfate  every day to manage his iron levels, which were low due to chronic inflammation. His ferritin levels are high.  The patient has stable but persistent anemia. His Hbg typically is in the 8's. He had tachycardia and hypoxia and presented to the ER where he had worsening anemia with a Hbg of 6.8. He received 2 units of blood and EGD  showing congested esophagus, Schatzki's ring, nonbleeding angiectasia ablated with APC, erythematous stomach, duodenal bulb AVM. The biposies are negative for malignancy. He was started on PPI and carafate .   The patient has been having some ongoing struggles with loose stool and diarrhea. He has frequent bowel movements with small quantity. He previously had  Gastrostomy tube. He previously had ileostomy which was takedown. He previously had a feeding tube. He struggles with nutrition concerns and weight loss. He sees a member and the nutritionist team.    He was seen in the ER on 6/4 due to dehydration  and weakness. He received IV fluids.   He has a past medical history of a stroke a year ago and is on aspirin  for secondary prevention. He also takes Lipitor and has been prescribed Remeron  to stimulate appetite, though its effectiveness is uncertain. He experiences chronic fatigue and frequently feels cold. He denies change in his breathing.   Today, since the patient has been off treatment for several weeks today he is actually feeling the best he has felt in some time.  He has not had any treatment since 08/20/2023.  The patient's wife mentions that she is actually able to converse with him and have a conversation with him and the brain fog seems to be improving.  They are wanting to explore possible treatment break.  Besides that he is starting to feel a little bit better he denies any other changes in his health.  Specifically he denies any current bleeding or visible bleeding.  He has not had any nausea or vomiting since last being seen.  Denies any fevers.  He denies any changes with his breathing, as of breath, or any concerning cough.  He also seems to be having less diarrhea.  His surgeon has him on Lomotil  8 tablets/day which does help his diarrhea.  His most recent imaging was a CT angiogram performed in the ER in May 2025 which did not show any disease progression.  He is here today for more detailed discussion about his current condition and treatment options.    MEDICAL HISTORY: Past Medical History:  Diagnosis Date   Anemia    Cancer (HCC)    bladder cancer   COPD (chronic obstructive pulmonary disease) (HCC)    mild    DIVERTICULOSIS, COLON 01/14/2007   Qualifier: Diagnosis of  By: Rachell Budge RN, Arlena Belts    GASTRITIS, CHRONIC 06/01/2004   Qualifier: Diagnosis of  By: Tereasa Felty CMA (AAMA), Robin     GERD 01/09/2007   Qualifier: Diagnosis of  By: Ena Harries, RN, Ellen     Heart murmur    as a child; no murmur heard 12/14/19   History of radiation therapy    Mediastinum-  05/15/22-06/28/22-Dr. Retta Caster   HYPERLIPIDEMIA 01/09/2007   Qualifier: Diagnosis of  By: Georgie Kiss     Hypertension    Lung mass    left upper nodule   Macular degeneration    right eye   NEOPLASM, MALIGNANT, BLADDER, HX OF 2002   Qualifier: Diagnosis of  By: Tereasa Felty CMA (AAMA), Robin  / no chemo or radiation   OSTEOARTHRITIS 01/14/2007   Qualifier: Diagnosis of  By: Rachell Budge, RN, Arlena Belts - knees   Rectal mass    Reflux esophagitis 06/01/2004   Qualifier: Diagnosis of  By: Tereasa Felty CMA Nonie Beady), Robin     Stroke [facility]    2024   TOBACCO USER 02/16/2009   Qualifier: Diagnosis of  By: Minnette Amato  MD, Ronie Cohen    VITAMIN D  DEFICIENCY 06/03/2007   Qualifier: Diagnosis of  By: Minnette Amato  MD, Ronie Cohen     ALLERGIES:  has no known allergies.  MEDICATIONS:  Current Outpatient Medications  Medication Sig Dispense Refill   acetaminophen  (TYLENOL ) 500 MG tablet Take 1,000 mg by mouth every 8 (eight)  hours as needed for moderate pain.     aspirin  EC 81 MG tablet Take 81 mg by mouth daily. Swallow whole.     atorvastatin  (LIPITOR) 20 MG tablet Take 1 tablet (20 mg total) by mouth daily. 90 tablet 3   Cholecalciferol  (VITAMIN D3 PO) Take 2,000 Units by mouth daily.     dexamethasone  (DECADRON ) 4 MG tablet PLEASE TAKE 1 TABLET TWICE A DAY THE DAY BEFORE, THE DAY OF, AND THE DAY AFTER CHEMOTHERAPY (Patient taking differently: Take 4 mg by mouth See admin instructions. Please take 1 tablet twice a day the day before, the day of, and the day after chemotherapy) 60 tablet 0   diphenoxylate -atropine  (LOMOTIL ) 2.5-0.025 MG tablet Take 2 tablets by mouth 4 (four) times daily. 240 tablet 5   ferrous sulfate  325 (65 FE) MG EC tablet Take 1 tablet (325 mg total) by mouth daily with breakfast. 90 tablet 0   folic acid  (FOLVITE ) 1 MG tablet TAKE 1 TABLET BY MOUTH EVERY DAY 90 tablet 1   lactobacillus acidophilus (BACID) TABS tablet Take 2 tablets by mouth daily at 12 noon.     lidocaine   (XYLOCAINE ) 5 % ointment Apply 1 Application topically 3 (three) times daily as needed for mild pain or moderate pain. 35.44 g 3   mirtazapine  (REMERON  SOL-TAB) 30 MG disintegrating tablet TAKE 1 TABLET BY MOUTH EVERYDAY AT BEDTIME (Patient taking differently: Take 30 mg by mouth at bedtime.) 90 tablet 1   Multiple Vitamins-Minerals (PRESERVISION AREDS 2) CAPS Take 1 capsule by mouth 2 (two) times daily.     Ondansetron  HCl (ZOFRAN  PO) Take 1 tablet by mouth as needed.     pantoprazole  (PROTONIX ) 40 MG tablet Take 1 tablet (40 mg total) by mouth 2 (two) times daily before a meal. 60 tablet 0   prochlorperazine  (COMPAZINE ) 10 MG tablet Take 10 mg by mouth as needed for nausea or vomiting.     sucralfate  (CARAFATE ) 1 GM/10ML suspension Take 10 mLs (1 g total) by mouth 2 (two) times daily for 14 days. 280 mL 0   No current facility-administered medications for this visit.    SURGICAL HISTORY:  Past Surgical History:  Procedure Laterality Date   BIOPSY  06/18/2022   Procedure: BIOPSY;  Surgeon: Annis Kinder, DO;  Location: [facility];  Service: Gastroenterology;;   BIOPSY OF SKIN SUBCUTANEOUS TISSUE AND/OR MUCOUS MEMBRANE  09/03/2023   Procedure: BIOPSY, SKIN, SUBCUTANEOUS TISSUE, OR MUCOUS MEMBRANE;  Surgeon: Normie Becton., MD;  Location: [facility];  Service: Gastroenterology;;   BREAST BIOPSY Left 03/16/2022   US  LT RADIOACTIVE SEED LOC 03/16/2022 [facility]   BRONCHIAL BIOPSY  10/13/2019   Procedure: BRONCHIAL BIOPSIES;  Surgeon: Prudy Brownie, DO;  Location: [facility];  Service: Pulmonary;;   BRONCHIAL BIOPSY  12/04/2022   Procedure: BRONCHIAL BIOPSIES;  Surgeon: Prudy Brownie, DO;  [facility]: [facility];  Service: Pulmonary;;   BRONCHIAL BRUSHINGS  10/13/2019   Procedure: BRONCHIAL BRUSHINGS;  Surgeon: Prudy Brownie, DO;  [facility];  Service: Pulmonary;;   BRONCHIAL NEEDLE ASPIRATION BIOPSY  10/13/2019   Procedure: BRONCHIAL NEEDLE ASPIRATION  BIOPSIES;  Surgeon: Prudy Brownie, DO;  [facility];  Service: Pulmonary;;   BRONCHIAL NEEDLE ASPIRATION BIOPSY  04/11/2022   Procedure: BRONCHIAL NEEDLE ASPIRATION BIOPSIES;  Surgeon: Prudy Brownie, DO;  [facility]: [facility];  Service: Cardiopulmonary;;   BRONCHIAL NEEDLE ASPIRATION BIOPSY  12/04/2022   Procedure: BRONCHIAL NEEDLE ASPIRATION BIOPSIES;  Surgeon: Prudy Brownie, DO;  [facility];  Service: Pulmonary;;   BRONCHIAL WASHINGS  10/13/2019   Procedure: BRONCHIAL WASHINGS;  Surgeon: Prudy Brownie, DO;  [facility];  Service: Pulmonary;;   BRONCHIAL WASHINGS  12/04/2022   Procedure: BRONCHIAL WASHINGS;  Surgeon: Prudy Brownie, DO;  [facility];  Service: Pulmonary;;   COLONOSCOPY     multiple   DIVERTING ILEOSTOMY N/A 12/14/2021   Procedure: DIVERTING ILEOSTOMY;  Surgeon: Candyce Champagne, MD;  Location: [facility];  Service: General;  Laterality: N/A;   ENDOBRONCHIAL ULTRASOUND Bilateral 04/11/2022   Procedure: ENDOBRONCHIAL ULTRASOUND;  Surgeon: Prudy Brownie, DO;  [facility];  Service: Cardiopulmonary;  Laterality: Bilateral;   ESOPHAGEAL DILATION  06/18/2022   Procedure: ESOPHAGEAL DILATION;  Surgeon: Annis Kinder, DO;  [facility]: [facility];  Service: Gastroenterology;;   ESOPHAGOGASTRODUODENOSCOPY N/A 09/03/2023   Procedure: EGD (ESOPHAGOGASTRODUODENOSCOPY);  Surgeon: Normie Becton., MD;  Location: [facility];  Service: Gastroenterology;  Laterality: N/A;   ESOPHAGOGASTRODUODENOSCOPY (EGD) WITH PROPOFOL  N/A 06/18/2022   Procedure: ESOPHAGOGASTRODUODENOSCOPY (EGD) WITH PROPOFOL ;  Surgeon: Annis Kinder, DO;  [facility];  Service: Gastroenterology;  Laterality: N/A;  Severe dysphagia/odynophagia ?radiation esophagitis versus oral candidiasis   FIDUCIAL MARKER PLACEMENT  12/04/2022   Procedure: FIDUCIAL MARKER PLACEMENT;  Surgeon: Prudy Brownie, DO;  [facility];  Service:  Pulmonary;;   ILEOSTOMY CLOSURE N/A 03/22/2022   Procedure: OPEN TAKEDOWN OF LOOP ILEOSTOMY;  Surgeon: Candyce Champagne, MD;  Location: [facility];  Service: General;  Laterality: N/A;  GEN w/ERAS PATHWAY LOCAL   INTERCOSTAL NERVE BLOCK Left 12/16/2019   Procedure: INTERCOSTAL NERVE BLOCK;  Surgeon: Hilarie Lovely, MD;  [facility];  Service: Thoracic;  Laterality: Left;   IR FLUORO RM 30-60 MIN  07/02/2022   IR IMAGING GUIDED PORT INSERTION  05/21/2023   KNEE SURGERY  07/20/2008   bilat.   NASAL SEPTUM SURGERY  1995   NODE DISSECTION Left 12/16/2019   Procedure: NODE DISSECTION;  Surgeon: Hilarie Lovely, MD;  [facility];  Service: Thoracic;  Laterality: Left;   PEG PLACEMENT N/A 07/04/2022   Procedure: PERCUTANEOUS ENDOSCOPIC GASTROSTOMY (PEG) PLACEMENT;  Surgeon: Kenney Peacemaker, MD;  Location: [facility];  Service: Gastroenterology;  Laterality: N/A;   RADIOACTIVE SEED GUIDED AXILLARY SENTINEL LYMPH NODE Left 03/20/2022   Procedure: RADIOACTIVE SEED GUIDED LEFT AXILLARY SENTINEL LYMPH NODE BIOPSY;  Surgeon: Sim Dryer, MD;  [facility];  Service: General;  Laterality: Left;   RECTAL EXAM UNDER ANESTHESIA N/A 03/22/2022   Procedure: ANORECTAL EXAMINATION UNDER ANESTHESIA;  Surgeon: Candyce Champagne, MD;  Location: [facility];  Service: General;  Laterality: N/A;   TONSILLECTOMY     TRANSURETHRAL RESECTION OF BLADDER TUMOR  2002   UPPER GASTROINTESTINAL ENDOSCOPY     VIDEO BRONCHOSCOPY  04/11/2022   Procedure: VIDEO BRONCHOSCOPY;  Surgeon: Prudy Brownie, DO;  [facility];  Service: Cardiopulmonary;;   VIDEO BRONCHOSCOPY WITH ENDOBRONCHIAL NAVIGATION N/A 10/13/2019   Procedure: VIDEO BRONCHOSCOPY WITH ENDOBRONCHIAL NAVIGATION;  Surgeon: Prudy Brownie, DO;  [facility];  Service: Pulmonary;  Laterality: N/A;   WISDOM TOOTH EXTRACTION     XI ROBOTIC ASSISTED LOWER ANTERIOR RESECTION N/A 12/14/2021   Procedure: XI ROBOTIC ASSISTED LOWER ANTERIOR ULTRA LOW  RECTOSIGMOID RESECTION, COLOANAL HAND SEWN ANASTOMOSIS, AND BILATERAL TAP BLOCK;  Surgeon: Candyce Champagne, MD;  Location: [facility];  Service: General;  Laterality: N/A;    REVIEW OF SYSTEMS:   Constitutional: Positive for improving but persistent fatigue, decreased  appetite (but improving). Positive for cold intolerance. Negative for chills and fever.  HENT: Negative for mouth sores, nosebleeds, sore throat and trouble swallowing.   Eyes: Negative for eye problems and icterus.  Respiratory: Positive for stable dyspnea on exertion.  Negative for cough, hemoptysis, and wheezing.   Cardiovascular: Negative for chest pain and leg swelling.  Gastrointestinal: Positive for chronic irregular bowel movements/diarrhea (stable to improved). Negative for nausea and vomiting.  Genitourinary: Negative for bladder incontinence, difficulty urinating, dysuria, frequency and hematuria.   Musculoskeletal: Negative for back pain, gait problem, neck pain and neck stiffness.  Skin: Negative for itching and rash.  Neurological: Negative for dizziness, extremity weakness, gait problem, headaches, light-headedness and seizures.  Hematological: Negative for adenopathy. Does not bruise/bleed easily.  Psychiatric/Behavioral: Negative for confusion, depression and sleep disturbance. The patient is not nervous/anxious.     PHYSICAL EXAMINATION:  There were no vitals taken for this visit.  ECOG PERFORMANCE STATUS: 2  Physical Exam  Constitutional: Oriented to person, place, and time and cachetic appearing male and in no distress.  HENT:  Head: Normocephalic and atraumatic.  Mouth/Throat: Oropharynx is clear and moist. No oropharyngeal exudate.  Eyes: Conjunctivae are normal. Right eye exhibits no discharge. Left eye exhibits no discharge. No scleral icterus.  Neck: Normal range of motion. Neck supple.  Cardiovascular: Normal rate, regular rhythm, normal heart sounds and intact distal pulses.   Pulmonary/Chest: Effort  normal and breath sounds normal. No respiratory distress. No wheezes. No rales.  Abdominal: Soft. Bowel sounds are normal. Exhibits no distension and no mass. There is no tenderness.  Musculoskeletal: Normal range of motion. Exhibits no edema.  Lymphadenopathy:    No cervical adenopathy.  Neurological: Alert and oriented to person, place, and time. Exhibits muscle wasting. Gait normal. Coordination normal.  Skin: Skin is warm and dry. No rash noted. Not diaphoretic. No erythema. No pallor.  Psychiatric: Mood, memory and judgment normal.  Vitals reviewed.    LABORATORY DATA: Lab Results  Component Value Date   WBC 10.4 09/18/2023   HGB 10.5 (L) 09/18/2023   HCT 33.8 (L) 09/18/2023   MCV 96.6 09/18/2023   PLT 220 09/18/2023      Chemistry      Component Value Date/Time   NA 135 09/18/2023 1113   K 3.5 09/18/2023 1113   CL 97 (L) 09/18/2023 1113   CO2 27 09/18/2023 1113   BUN 28 (H) 09/18/2023 1113   CREATININE 0.60 (L) 09/18/2023 1113   CREATININE 0.47 (L) 09/10/2023 1329      Component Value Date/Time   CALCIUM  9.2 09/18/2023 1113   ALKPHOS 136 (H) 09/18/2023 1113   AST 33 09/18/2023 1113   AST 41 09/10/2023 1329   ALT 49 (H) 09/18/2023 1113   ALT 51 (H) 09/10/2023 1329   BILITOT 0.5 09/18/2023 1113   BILITOT 0.3 09/10/2023 1329       RADIOGRAPHIC STUDIES:  DG Chest Portable 1 View Result Date: 09/18/2023 CLINICAL DATA:  Weakness, tachycardia EXAM: PORTABLE CHEST 1 VIEW COMPARISON:  January 03, 2023 FINDINGS: No change compared with prior examination. No change in the left suprahilar aortic pulmonic density. Which correlates with the prior CT chronic consolidation and atelectasis. No acute infiltrates consolidations or pulmonary edema Right IJ Infusaport catheter in good position IMPRESSION: *No change compared with prior examination. *No change in the left suprahilar aortic pulmonic density. Which correlates with the prior CT chronic consolidation and atelectasis. No  acute cardiopulmonary disease Electronically Signed   By: Fredrich Jefferson  M.D.   On: 09/18/2023 14:47   CT Angio Chest PE W and/or Wo Contrast Result Date: 09/02/2023 CLINICAL DATA:  Concern for pulmonary embolism. Metastatic lung cancer. EXAM: CT ANGIOGRAPHY CHEST WITH CONTRAST TECHNIQUE: Multidetector CT imaging of the chest was performed using the standard protocol during bolus administration of intravenous contrast. Multiplanar CT image reconstructions and MIPs were obtained to evaluate the vascular anatomy. RADIATION DOSE REDUCTION: This exam was performed according to the departmental dose-optimization program which includes automated exposure control, adjustment of the mA and/or kV according to patient size and/or use of iterative reconstruction technique. CONTRAST:  60mL OMNIPAQUE  IOHEXOL  350 MG/ML SOLN COMPARISON:  Chest CT dated 07/18/2023. FINDINGS: Cardiovascular: There is no cardiomegaly or pericardial effusion. Advanced 3 vessel coronary vascular calcification. Moderate atherosclerotic calcification of the thoracic aorta. No aneurysmal dilatation or dissection. The origins of the great vessels of the aortic arch are patent. Right-sided Port-A-Cath with tip in the right atrium. No pulmonary artery embolus identified. Mediastinum/Nodes: No obvious adenopathy. Evaluation however is limited due to infiltration of the hilar and mediastinal fat planes. Mild diffuse circumferential thickening of the esophagus may be related to underdistention. No mediastinal fluid collection. Lungs/Pleura: Small right pleural effusion as well as similar appearance of loculated appearing small fluid posterior to the aortic arch. There is background of emphysema. Similar appearance of left perihilar consolidative changes, likely post treatment changes and fibrosis. Recurrent disease is not excluded. No new consolidation. Multiple small we right upper lobe nodules similar to prior CT measuring up to 4 mm in the right apex. No  pneumothorax. The central airways remain patent. Upper Abdomen: Bilateral perinephric edema. Correlation with urinalysis recommended to exclude UTI. Musculoskeletal: Loss of subcutaneous fat and cachexia. Osteopenia with degenerative changes of the spine. No acute osseous pathology. Review of the MIP images confirms the above findings. IMPRESSION: 1. No CT evidence of pulmonary artery embolus. 2. Small right pleural effusion. 3. Similar appearance of left perihilar consolidative changes, likely post treatment changes and fibrosis. Recurrent disease is not excluded. 4. Aortic Atherosclerosis (ICD10-I70.0) and Emphysema (ICD10-J43.9). Electronically Signed   By: Angus Bark M.D.   On: 09/02/2023 17:09     ASSESSMENT/PLAN:  This is a very pleasant 73 year old Caucasian male diagnosed with recurrent disease but he was initially diagnosed as stage IIIb (T1a, N1, M0) non-small cell lung cancer, adenocarcinoma.  He was initially diagnosed in June 2021 and is status post left upper lobectomy with lymph node dissection on 12/16/2019 to the care of Dr. Deloise Ferries.  The patient has no actionable mutations and his PD-L1 expression is 1%.    For his lung cancer, the patient initially completed adjuvant systemic chemotherapy with cisplatin  75 mg/m and Alimta  500 mg/m IV every 3 weeks status post 4 cycles.    For the pelvic GIST tumor, the patient completed neoadjuvant treatment with imatinib  400 mg p.o. daily in April 2023.  He patient underwent surgical resection of the remaining gastrointestinal stromal tumor under the care of Dr. Hershell Lose on December 14, 2021 with very minimal residual disease.     In November 2023, his restaging CT scan for his lung cancer showed increased soft tissue density in the left hilar region concerning for possible developing lymphadenopathy or disease recurrence.  A PET scan was performed and showed new hypermetabolic left axillary, mediastinal, and left hilar adenopathy compatible with  disease recurrence.   He underwent excisional biopsy of left axillary lymph node which was not conclusive of malignancy.   Therefore Dr. Thelda Finney performed a  repeat video bronchoscopy with EBUS on 04/11/2022 and the biopsy from the station 7 lymph node showed recurrent adenocarcinoma.  Therefore the patient started concurrent chemoradiation with carboplatin  for an AUC of 2 and paclitaxel  45 mg/m. His first dose was on 05/14/2022. The patient was admitted to the [name] on June 07, 2022 with significant sepsis and Klebsiella pneumonia bacteremia.    He continues to have persistent diarrhea despite discontinuation of imatinib  and treatment with cholestyramine . This is likely functional diarrhea after his surgical resection for the pelvic GIST tumor.      Repeat PET scan showed no concerning findings for disease progression except for suspicious slowly growing adenocarcinoma in the right lower lobe but inflammatory changes in the right lung as well as suspicious reactive mediastinal lymphadenopathy.  The patient had repeat bronchoscopy by Dr. Thelda Finney and the final pathology was consistent with recurrent non-small cell lung cancer. The patient started systemic chemotherapy with carboplatin  for AUC of 5 and Alimta  500 Mg/M2 on 12/25/2022.  Status post 6 cycles.    He is currently on maintenance treatment with single agent Alimta  every 3 weeks status post 6 cycles.     The patient has not had treatment since 08/20/2023 due to failure to thrive  and weakness.  The patient was seen with Dr. Marguerita Shih today.  We had a lengthy discussion with the patient today about his current condition and treatment options.  The patient's most recent CT angiogram was stable.  After further discussion, it was agreed upon for a treatment break.  Dr. Marguerita Shih recommends treatment break for 2 months followed by repeat imaging studies (CT CAP).  If stable we may give him another few months of a treatment break.  If there are any  concerns for disease progression we can always resume treatment.  The patient and his wife are in agreement with this as they are hoping that he will be able to help build self back up with nutrition and his fatigue and weakness.  Will continue taking iron daily.  His anemia seems to be improving.  The patient will continue taking Lomotil  for his diarrhea.    The patient was advised to call immediately if she has any concerning symptoms in the interval. The patient voices understanding of current disease status and treatment options and is in agreement with the current care plan. All questions were answered. The patient knows to call the clinic with any problems, questions or concerns. We can certainly see the patient much sooner if necessary    No orders of the defined types were placed in this encounter.    Ardyn Forge L Kaeleb Emond, PA-C 09/21/23  ADDENDUM: Hematology/Oncology Attending: I had a face-to-face encounter with the patient today.  I reviewed his record, lab, recent scan and recommended his care plan.  This is a very pleasant 73 years old white male with recurrent/metastatic non-small cell lung cancer that was initially diagnosed as stage IIb in June 2021 with evidence for disease metastatic and progression in January 2024 and then August 2024.  The patient started palliative systemic chemotherapy with carboplatin  and Alimta  every 3 weeks for 6 cycles.  He also received maintenance treatment with single agent Alimta  every 3 weeks for 6 cycles and his treatment has been on hold since Aug 20, 2023.  The patient has been complaining of increasing fatigue and weakness recently and he and his wife were interested in taking a break of the chemotherapy because of his worsening general condition. I had a lengthy discussion with the  patient and his wife about his condition and because of his persistent diarrhea as well as fatigue and weakness and anemia secondary to his treatment we  recommended for him to stay on observation for the next 2 months. Will repeat CT scan of the chest, abdomen and pelvis in 2 months and if there is no evidence for disease progression he can continue on observation for a little bit longer. The patient his wife are in agreement with the current plan. He was advised to call immediately if he has any other concerning symptoms in the interval. The total time spent in the appointment was 30 minutes including review of chart and various tests results, discussions about plan of care and coordination of care plan . Disclaimer: This note was dictated with voice recognition software. Similar sounding words can inadvertently be transcribed and may be missed upon review. Aurelio Blower, MD

## 2023-09-23 ENCOUNTER — Encounter: Payer: Self-pay | Admitting: Internal Medicine

## 2023-09-23 ENCOUNTER — Other Ambulatory Visit: Payer: Self-pay | Admitting: Physician Assistant

## 2023-09-23 DIAGNOSIS — C3431 Malignant neoplasm of lower lobe, right bronchus or lung: Secondary | ICD-10-CM

## 2023-09-23 DIAGNOSIS — C3492 Malignant neoplasm of unspecified part of left bronchus or lung: Secondary | ICD-10-CM

## 2023-09-23 LAB — CULTURE, BLOOD (ROUTINE X 2)
Culture: NO GROWTH
Culture: NO GROWTH

## 2023-09-24 ENCOUNTER — Inpatient Hospital Stay

## 2023-09-24 ENCOUNTER — Inpatient Hospital Stay: Attending: Internal Medicine

## 2023-09-24 ENCOUNTER — Inpatient Hospital Stay: Attending: Internal Medicine | Admitting: Physician Assistant

## 2023-09-24 ENCOUNTER — Encounter: Payer: Self-pay | Admitting: Medical Oncology

## 2023-09-24 VITALS — BP 115/89 | HR 91 | Temp 97.9°F | Wt 125.0 lb

## 2023-09-24 DIAGNOSIS — Z79899 Other long term (current) drug therapy: Secondary | ICD-10-CM | POA: Insufficient documentation

## 2023-09-24 DIAGNOSIS — D649 Anemia, unspecified: Secondary | ICD-10-CM | POA: Diagnosis not present

## 2023-09-24 DIAGNOSIS — Z95828 Presence of other vascular implants and grafts: Secondary | ICD-10-CM

## 2023-09-24 DIAGNOSIS — C771 Secondary and unspecified malignant neoplasm of intrathoracic lymph nodes: Secondary | ICD-10-CM | POA: Diagnosis not present

## 2023-09-24 DIAGNOSIS — T451X5A Adverse effect of antineoplastic and immunosuppressive drugs, initial encounter: Secondary | ICD-10-CM

## 2023-09-24 DIAGNOSIS — C3490 Malignant neoplasm of unspecified part of unspecified bronchus or lung: Secondary | ICD-10-CM

## 2023-09-24 DIAGNOSIS — R197 Diarrhea, unspecified: Secondary | ICD-10-CM | POA: Diagnosis not present

## 2023-09-24 DIAGNOSIS — C3492 Malignant neoplasm of unspecified part of left bronchus or lung: Secondary | ICD-10-CM | POA: Diagnosis not present

## 2023-09-24 DIAGNOSIS — Z7982 Long term (current) use of aspirin: Secondary | ICD-10-CM | POA: Insufficient documentation

## 2023-09-24 DIAGNOSIS — Z7189 Other specified counseling: Secondary | ICD-10-CM | POA: Diagnosis not present

## 2023-09-24 DIAGNOSIS — C3412 Malignant neoplasm of upper lobe, left bronchus or lung: Secondary | ICD-10-CM | POA: Insufficient documentation

## 2023-09-24 DIAGNOSIS — C3431 Malignant neoplasm of lower lobe, right bronchus or lung: Secondary | ICD-10-CM

## 2023-09-24 LAB — CBC WITH DIFFERENTIAL (CANCER CENTER ONLY)
Abs Immature Granulocytes: 0.02 10*3/uL (ref 0.00–0.07)
Basophils Absolute: 0 10*3/uL (ref 0.0–0.1)
Basophils Relative: 1 %
Eosinophils Absolute: 0.1 10*3/uL (ref 0.0–0.5)
Eosinophils Relative: 1 %
HCT: 33.6 % — ABNORMAL LOW (ref 39.0–52.0)
Hemoglobin: 10.7 g/dL — ABNORMAL LOW (ref 13.0–17.0)
Immature Granulocytes: 0 %
Lymphocytes Relative: 13 %
Lymphs Abs: 0.8 10*3/uL (ref 0.7–4.0)
MCH: 29.4 pg (ref 26.0–34.0)
MCHC: 31.8 g/dL (ref 30.0–36.0)
MCV: 92.3 fL (ref 80.0–100.0)
Monocytes Absolute: 0.4 10*3/uL (ref 0.1–1.0)
Monocytes Relative: 7 %
Neutro Abs: 4.9 10*3/uL (ref 1.7–7.7)
Neutrophils Relative %: 78 %
Platelet Count: 204 10*3/uL (ref 150–400)
RBC: 3.64 MIL/uL — ABNORMAL LOW (ref 4.22–5.81)
RDW: 17 % — ABNORMAL HIGH (ref 11.5–15.5)
WBC Count: 6.2 10*3/uL (ref 4.0–10.5)
nRBC: 0 % (ref 0.0–0.2)

## 2023-09-24 LAB — CMP (CANCER CENTER ONLY)
ALT: 65 U/L — ABNORMAL HIGH (ref 0–44)
AST: 51 U/L — ABNORMAL HIGH (ref 15–41)
Albumin: 3.2 g/dL — ABNORMAL LOW (ref 3.5–5.0)
Alkaline Phosphatase: 145 U/L — ABNORMAL HIGH (ref 38–126)
Anion gap: 6 (ref 5–15)
BUN: 21 mg/dL (ref 8–23)
CO2: 31 mmol/L (ref 22–32)
Calcium: 9 mg/dL (ref 8.9–10.3)
Chloride: 99 mmol/L (ref 98–111)
Creatinine: 0.55 mg/dL — ABNORMAL LOW (ref 0.61–1.24)
GFR, Estimated: >60 mL/min (ref >60)
Glucose, Bld: 123 mg/dL — ABNORMAL HIGH (ref 70–99)
Potassium: 3.9 mmol/L (ref 3.5–5.1)
Sodium: 136 mmol/L (ref 135–145)
Total Bilirubin: 0.3 mg/dL (ref 0.0–1.2)
Total Protein: 7.4 g/dL (ref 6.5–8.1)

## 2023-09-24 LAB — SAMPLE TO BLOOD BANK

## 2023-09-24 MED ORDER — SODIUM CHLORIDE 0.9% FLUSH
10.0000 mL | Freq: Once | INTRAVENOUS | Status: AC
  Administered 2023-09-24: 10 mL

## 2023-09-24 MED ORDER — HEPARIN SOD (PORK) LOCK FLUSH 100 UNIT/ML IV SOLN
500.0000 [IU] | Freq: Once | INTRAVENOUS | Status: AC
  Administered 2023-09-24: 500 [IU]

## 2023-10-01 ENCOUNTER — Telehealth: Payer: Self-pay | Admitting: Family Medicine

## 2023-10-01 ENCOUNTER — Ambulatory Visit

## 2023-10-01 NOTE — Telephone Encounter (Signed)
 Copied from CRM [id number]. Topic: Clinical - Medical Advice >> Oct 01, 2023 12:45 PM Lajean Pike wrote: Reason for CRM: [id number] [name] (Wife) called in and stated that patients hemoglobin is still low and would like to inquire if he is eligible to receive iron iv infusions? And, if so are they done in the clinic? [name] would appreciate a call back to her phone.

## 2023-10-02 NOTE — Telephone Encounter (Signed)
 This provider was OOO yesterday.  Upon chart review, labs weren't ordered by this provider.  Appears they were done by Heme/Onc.

## 2023-10-03 ENCOUNTER — Telehealth: Payer: Self-pay | Admitting: Medical Oncology

## 2023-10-03 NOTE — Telephone Encounter (Signed)
-  She feels like the iron tablet is not helping Jesse [name] . He sleeps 12-16 hours a day.   Is he eligible for an iron infusion?

## 2023-10-03 NOTE — Telephone Encounter (Signed)
 I told [name] that Dr Marguerita Shih said  Right now, based on his lab results--especially his very high ferritin levels--and his complex medical history, IV iron infusions would not be appropriate. His fatigue is likely due to several chronic issues.   She said she is about to take him to ED because  he is very weak, oxygen sat =88% then up and down . Now his oxygen is 90%. He got up and walked to kitchen today without problems. He told her  leave me alone for a half hour.   I asked pt if he is interested in a home health nurse seeing him on a regular basis. He said he would like that.

## 2023-10-04 ENCOUNTER — Ambulatory Visit: Admitting: Physician Assistant

## 2023-10-07 ENCOUNTER — Other Ambulatory Visit: Payer: Self-pay | Admitting: Medical Oncology

## 2023-10-07 DIAGNOSIS — C3490 Malignant neoplasm of unspecified part of unspecified bronchus or lung: Secondary | ICD-10-CM

## 2023-10-15 ENCOUNTER — Other Ambulatory Visit

## 2023-10-15 ENCOUNTER — Ambulatory Visit: Admitting: Physician Assistant

## 2023-10-15 ENCOUNTER — Encounter

## 2023-10-15 ENCOUNTER — Ambulatory Visit

## 2023-10-22 ENCOUNTER — Encounter

## 2023-10-22 ENCOUNTER — Other Ambulatory Visit: Payer: Self-pay | Admitting: Medical Oncology

## 2023-10-22 ENCOUNTER — Telehealth: Payer: Self-pay

## 2023-10-22 ENCOUNTER — Other Ambulatory Visit

## 2023-10-22 ENCOUNTER — Ambulatory Visit: Admitting: Internal Medicine

## 2023-10-22 ENCOUNTER — Ambulatory Visit

## 2023-10-22 NOTE — Telephone Encounter (Signed)
 Received a call from patients wife stating that Jesse [name] is having trouble swallowing again and wanted to know if it was okay to use viscous lidocaine  solution. He has two bottles at home.

## 2023-10-23 ENCOUNTER — Encounter: Payer: Self-pay | Admitting: Internal Medicine

## 2023-10-24 ENCOUNTER — Telehealth: Payer: Self-pay

## 2023-10-24 ENCOUNTER — Other Ambulatory Visit: Payer: Self-pay | Admitting: Radiation Oncology

## 2023-10-24 MED ORDER — SUCRALFATE 1 GM/10ML PO SUSP: 1.0000 g | Freq: Three times a day (TID) | ORAL | 0 refills | Status: DC

## 2023-10-24 NOTE — Telephone Encounter (Signed)
 Received a voicemail from patients wife stating that Jesse [name] does not like the lidocaine  solution and prefers a refill on Carafate . Please advise.

## 2023-10-24 NOTE — Telephone Encounter (Signed)
 Carafate  was called in to the [facility] in [place].

## 2023-10-28 ENCOUNTER — Encounter: Payer: Self-pay | Admitting: Internal Medicine

## 2023-10-31 ENCOUNTER — Telehealth: Payer: Self-pay | Admitting: Internal Medicine

## 2023-10-31 NOTE — Telephone Encounter (Signed)
 Attempted to reach patient. No answer, left VM for patient to return call.

## 2023-10-31 NOTE — Telephone Encounter (Signed)
 Noted thanks.  Is he having problems with diarrhea or constipation?  He can use Desitin or try something like Calmoseptine on the irritated areas.

## 2023-10-31 NOTE — Telephone Encounter (Signed)
 Patient returned call. Discussed bowel movements. Patient reports, It feels like it gets stuck in there and every once in a while a little bit will come down. It's hard as a rock, and I feel like I have to go to the bathroom every 15 minutes. Patient reports that he has had to digitally remove stool at times but that is very painful. Patient had a difficult time explaining what he meant but states, this is the best way I can describe it.

## 2023-10-31 NOTE — Telephone Encounter (Signed)
 Patient requesting f/u call to discuss abdominal pain. Please advise.

## 2023-10-31 NOTE — Telephone Encounter (Signed)
 Patient states his rectum is receding into my bowels. It is becoming hard to clean and it is causing burning and pain. He also reports that it feels like it is clogging up. Patient requesting office visit with Dr. Avram if possible. Appointment scheduled for 11/26/2023 at 10:10 AM with Dr. Avram. Will also route to provider to review.

## 2023-11-05 ENCOUNTER — Ambulatory Visit

## 2023-11-05 ENCOUNTER — Other Ambulatory Visit

## 2023-11-05 ENCOUNTER — Ambulatory Visit: Admitting: Internal Medicine

## 2023-11-12 ENCOUNTER — Ambulatory Visit

## 2023-11-12 ENCOUNTER — Ambulatory Visit: Admitting: Physician Assistant

## 2023-11-12 ENCOUNTER — Other Ambulatory Visit

## 2023-11-14 ENCOUNTER — Inpatient Hospital Stay: Attending: Internal Medicine

## 2023-11-14 ENCOUNTER — Ambulatory Visit (HOSPITAL_COMMUNITY)
Admission: RE | Admit: 2023-11-14 | Discharge: 2023-11-14 | Disposition: A | Discharge status: Home / Self Care | Source: Ambulatory Visit | Attending: Physician Assistant | Admitting: Physician Assistant

## 2023-11-14 DIAGNOSIS — Z79899 Other long term (current) drug therapy: Secondary | ICD-10-CM | POA: Insufficient documentation

## 2023-11-14 DIAGNOSIS — C3412 Malignant neoplasm of upper lobe, left bronchus or lung: Secondary | ICD-10-CM | POA: Insufficient documentation

## 2023-11-14 DIAGNOSIS — C3490 Malignant neoplasm of unspecified part of unspecified bronchus or lung: Secondary | ICD-10-CM | POA: Diagnosis not present

## 2023-11-14 DIAGNOSIS — Z95828 Presence of other vascular implants and grafts: Secondary | ICD-10-CM

## 2023-11-14 DIAGNOSIS — C771 Secondary and unspecified malignant neoplasm of intrathoracic lymph nodes: Secondary | ICD-10-CM | POA: Diagnosis not present

## 2023-11-14 DIAGNOSIS — C3431 Malignant neoplasm of lower lobe, right bronchus or lung: Secondary | ICD-10-CM

## 2023-11-14 DIAGNOSIS — C3492 Malignant neoplasm of unspecified part of left bronchus or lung: Secondary | ICD-10-CM

## 2023-11-14 DIAGNOSIS — J90 Pleural effusion, not elsewhere classified: Secondary | ICD-10-CM | POA: Diagnosis not present

## 2023-11-14 DIAGNOSIS — T451X5A Adverse effect of antineoplastic and immunosuppressive drugs, initial encounter: Secondary | ICD-10-CM

## 2023-11-14 DIAGNOSIS — I700 Atherosclerosis of aorta: Secondary | ICD-10-CM | POA: Diagnosis not present

## 2023-11-14 LAB — CMP (CANCER CENTER ONLY)
ALT: 16 U/L (ref 0–44)
AST: 19 U/L (ref 15–41)
Albumin: 3.7 g/dL (ref 3.5–5.0)
Alkaline Phosphatase: 112 U/L (ref 38–126)
Anion gap: 4 — ABNORMAL LOW (ref 5–15)
BUN: 25 mg/dL — ABNORMAL HIGH (ref 8–23)
CO2: 32 mmol/L (ref 22–32)
Calcium: 9.6 mg/dL (ref 8.9–10.3)
Chloride: 98 mmol/L (ref 98–111)
Creatinine: 0.57 mg/dL — ABNORMAL LOW (ref 0.61–1.24)
GFR, Estimated: >60 mL/min (ref >60)
Glucose, Bld: 115 mg/dL — ABNORMAL HIGH (ref 70–99)
Potassium: 4.3 mmol/L (ref 3.5–5.1)
Sodium: 134 mmol/L — ABNORMAL LOW (ref 135–145)
Total Bilirubin: 0.4 mg/dL (ref 0.0–1.2)
Total Protein: 7.5 g/dL (ref 6.5–8.1)

## 2023-11-14 LAB — CBC WITH DIFFERENTIAL (CANCER CENTER ONLY)
Abs Immature Granulocytes: 0.01 K/uL (ref 0.00–0.07)
Basophils Absolute: 0 K/uL (ref 0.0–0.1)
Basophils Relative: 0 %
Eosinophils Absolute: 0 K/uL (ref 0.0–0.5)
Eosinophils Relative: 0 %
HCT: 37 % — ABNORMAL LOW (ref 39.0–52.0)
Hemoglobin: 12.4 g/dL — ABNORMAL LOW (ref 13.0–17.0)
Immature Granulocytes: 0 %
Lymphocytes Relative: 12 %
Lymphs Abs: 0.7 K/uL (ref 0.7–4.0)
MCH: 30.8 pg (ref 26.0–34.0)
MCHC: 33.5 g/dL (ref 30.0–36.0)
MCV: 91.8 fL (ref 80.0–100.0)
Monocytes Absolute: 0.3 K/uL (ref 0.1–1.0)
Monocytes Relative: 5 %
Neutro Abs: 5.1 K/uL (ref 1.7–7.7)
Neutrophils Relative %: 83 %
Platelet Count: 166 K/uL (ref 150–400)
RBC: 4.03 MIL/uL — ABNORMAL LOW (ref 4.22–5.81)
RDW: 15.8 % — ABNORMAL HIGH (ref 11.5–15.5)
WBC Count: 6.2 K/uL (ref 4.0–10.5)
nRBC: 0 % (ref 0.0–0.2)

## 2023-11-14 LAB — SAMPLE TO BLOOD BANK

## 2023-11-14 MED ORDER — IOHEXOL 300 MG/ML  SOLN
100.0000 mL | Freq: Once | INTRAMUSCULAR | Status: AC | PRN
  Administered 2023-11-14: 80 mL via INTRAVENOUS

## 2023-11-14 MED ORDER — HEPARIN SOD (PORK) LOCK FLUSH 100 UNIT/ML IV SOLN
500.0000 [IU] | Freq: Once | INTRAVENOUS | Status: AC
  Administered 2023-11-14: 500 [IU] via INTRAVENOUS

## 2023-11-14 MED ORDER — SODIUM CHLORIDE 0.9% FLUSH
10.0000 mL | Freq: Once | INTRAVENOUS | Status: AC
Start: 2023-11-14 — End: 2023-11-14
  Administered 2023-11-14: 10 mL

## 2023-11-14 MED ORDER — HEPARIN SOD (PORK) LOCK FLUSH 100 UNIT/ML IV SOLN
INTRAVENOUS | Status: AC
Start: 2023-11-14 — End: 2023-11-14
  Filled 2023-11-14: qty 5

## 2023-11-21 ENCOUNTER — Telehealth: Payer: Self-pay | Admitting: *Deleted

## 2023-11-21 ENCOUNTER — Inpatient Hospital Stay: Attending: Internal Medicine | Admitting: Internal Medicine

## 2023-11-21 VITALS — BP 117/87 | HR 100 | Temp 97.7°F | Wt 118.7 lb

## 2023-11-21 DIAGNOSIS — K5909 Other constipation: Secondary | ICD-10-CM | POA: Insufficient documentation

## 2023-11-21 DIAGNOSIS — C771 Secondary and unspecified malignant neoplasm of intrathoracic lymph nodes: Secondary | ICD-10-CM | POA: Insufficient documentation

## 2023-11-21 DIAGNOSIS — Z79899 Other long term (current) drug therapy: Secondary | ICD-10-CM | POA: Diagnosis not present

## 2023-11-21 DIAGNOSIS — C3490 Malignant neoplasm of unspecified part of unspecified bronchus or lung: Secondary | ICD-10-CM

## 2023-11-21 DIAGNOSIS — Z923 Personal history of irradiation: Secondary | ICD-10-CM | POA: Insufficient documentation

## 2023-11-21 DIAGNOSIS — Z7982 Long term (current) use of aspirin: Secondary | ICD-10-CM | POA: Insufficient documentation

## 2023-11-21 DIAGNOSIS — R630 Anorexia: Secondary | ICD-10-CM | POA: Insufficient documentation

## 2023-11-21 DIAGNOSIS — C787 Secondary malignant neoplasm of liver and intrahepatic bile duct: Secondary | ICD-10-CM | POA: Diagnosis not present

## 2023-11-21 DIAGNOSIS — Z9221 Personal history of antineoplastic chemotherapy: Secondary | ICD-10-CM | POA: Diagnosis not present

## 2023-11-21 DIAGNOSIS — C3412 Malignant neoplasm of upper lobe, left bronchus or lung: Secondary | ICD-10-CM | POA: Diagnosis not present

## 2023-11-21 DIAGNOSIS — I10 Essential (primary) hypertension: Secondary | ICD-10-CM | POA: Diagnosis not present

## 2023-11-21 DIAGNOSIS — Z7952 Long term (current) use of systemic steroids: Secondary | ICD-10-CM | POA: Insufficient documentation

## 2023-11-21 DIAGNOSIS — Z8673 Personal history of transient ischemic attack (TIA), and cerebral infarction without residual deficits: Secondary | ICD-10-CM | POA: Insufficient documentation

## 2023-11-21 NOTE — Progress Notes (Signed)
 [facility] [phone]   [phone]  OFFICE PROGRESS NOTE  Jesse Clotilda SAUNDERS, MD [address] [place] [address]  DIAGNOSIS:  1) recurrent non-small cell lung cancer, adenocarcinoma with positive mediastinal lymph node in December 2023 that was initially diagnosed as stage IIB (T1 a, N1, M0) non-small cell lung cancer, adenocarcinoma diagnosed in June 2021.  The patient also has evidence for recurrent disease in the mediastinal lymph node in January 2024 and in the right lower lobe in August 2024. 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  Biomarker Findings Tumor Mutational Burden - 20 Muts/Mb Microsatellite status - MS-Stable Genomic Findings For a complete list of the genes assayed, please refer to the Appendix. KEAP1 I461fs*17 DNMT3A E817* RB1 splice site 1960+5G>C TP53 R209* 8 Disease relevant genes with no reportable alterations: ALK, BRAF, EGFR, ERBB2, KRAS, MET, RET, ROS1  PDL1 Expression: 1 %  PRIOR THERAPY:  1) status post left upper lobectomy with lymph node dissection on December 16, 2019 under the care of Dr. Shyrl.  The tumor size measured 0.9 cm but there was involvement of the level 10 L and 11 L.  2) Adjuvant systemic chemotherapy with cisplatin  75 mg/M2 and Alimta  500 mg/M2 every 3 weeks.  First dose February 04, 2020.  Status post 4 cycles. 3) Neoadjuvant treatment with imatinib  400 mg p.o. daily for the GIST tumor of the pelvic area.  First dose started July 18, 2020.  Status post 12 months of treatment. 4) status post robotic assisted very low anterior rectosigmoid resection with coloanal anastomosis, diverting loop ileostomy and wedge liver biopsy under the care of Dr. Sheldon on December 14, 2021 and it showed minimal residual gastrointestinal stromal tumor. 5) concurrent chemoradiation with weekly carboplatin  for AUC of 2 and paclitaxel  45 Mg/M2.  First dose May 14, 2022. 6) Imatinib  400 mg p.o.  daily 7) Systemic chemotherapy with carboplatin  for AUC of 5 and Alimta  500 Mg/M2 every 3 weeks.  First dose 12/25/2022.  Status post 6 cycles. 8) Maintenance treatment with single agent Alimta  500 Mg/M2, status post 6 cycles.  CURRENT THERAPY: Palliative and hospice care  INTERVAL HISTORY: Jesse [name] 73 y.o. male returns to the clinic today for follow-up visit. Discussed the use of AI scribe software for clinical note transcription with the patient, who gave verbal consent to proceed.  History of Present Illness Jesse [name] is a 73 year old male with recurrent non-small cell lung cancer, adenocarcinoma, who presents for evaluation with repeat CT scan of the chest, abdomen, and pelvis. He is accompanied by his wife.  Initially diagnosed with stage 2B non-small cell lung cancer, adenocarcinoma, in June 2021, he underwent a left upper lobectomy with lymph node dissection followed by systemic chemotherapy. He was treated with carboplatin  and Alimta  for six cycles, followed by six cycles of maintenance Alimta , which was discontinued in June 2025 at his request.  Since discontinuing treatment, he has experienced a lack of appetite, significant fatigue, and weakness. He notes a weight loss of approximately 6-7 pounds, with his weight decreasing from 125 pounds to 118.7 pounds. He reports frequent stools, which are not diarrhea, and has been using stool softeners to manage this issue. He spends approximately 20 hours a day in bed, mostly dozing rather than sleeping.  Current medications include stool softeners.     MEDICAL HISTORY: Past Medical History:  Diagnosis Date   Anemia    Cancer (HCC)    bladder  cancer   COPD (chronic obstructive pulmonary disease) (HCC)    mild    DIVERTICULOSIS, COLON 01/14/2007   Qualifier: Diagnosis of  By: Nunzio RN, Dagoberto Caldron    GASTRITIS, CHRONIC 06/01/2004   Qualifier: Diagnosis of  By: Bettie CMA (AAMA), Robin     GERD 01/09/2007    Qualifier: Diagnosis of  By: Krystal, RN, Ellen     Heart murmur    as a child; no murmur heard 12/14/19   History of radiation therapy    Mediastinum- 05/15/22-06/28/22-Dr. Lynwood Nasuti   HYPERLIPIDEMIA 01/09/2007   Qualifier: Diagnosis of  By: Krystal OBIE Czar     Hypertension    Lung mass    left upper nodule   Macular degeneration    right eye   NEOPLASM, MALIGNANT, BLADDER, HX OF 2002   Qualifier: Diagnosis of  By: Bettie CMA (AAMA), Robin  / no chemo or radiation   OSTEOARTHRITIS 01/14/2007   Qualifier: Diagnosis of  By: Nunzio, RN, Dagoberto Caldron - knees   Rectal mass    Reflux esophagitis 06/01/2004   Qualifier: Diagnosis of  By: Bettie CMA LEODIS), Robin     Stroke [facility])    2024   TOBACCO USER 02/16/2009   Qualifier: Diagnosis of  By: Jame  MD, Maude FALCON    VITAMIN D  DEFICIENCY 06/03/2007   Qualifier: Diagnosis of  By: Jame  MD, Maude FALCON     ALLERGIES:  has no known allergies.  MEDICATIONS:  Current Outpatient Medications  Medication Sig Dispense Refill   acetaminophen  (TYLENOL ) 500 MG tablet Take 1,000 mg by mouth every 8 (eight) hours as needed for moderate pain.     aspirin  EC 81 MG tablet Take 81 mg by mouth daily. Swallow whole.     atorvastatin  (LIPITOR) 20 MG tablet Take 1 tablet (20 mg total) by mouth daily. 90 tablet 3   Cholecalciferol  (VITAMIN D3 PO) Take 2,000 Units by mouth daily.     dexamethasone  (DECADRON ) 4 MG tablet PLEASE TAKE 1 TABLET TWICE A DAY THE DAY BEFORE, THE DAY OF, AND THE DAY AFTER CHEMOTHERAPY (Patient taking differently: Take 4 mg by mouth See admin instructions. Please take 1 tablet twice a day the day before, the day of, and the day after chemotherapy) 60 tablet 0   diphenoxylate -atropine  (LOMOTIL ) 2.5-0.025 MG tablet Take 2 tablets by mouth 4 (four) times daily. 240 tablet 5   ferrous sulfate  325 (65 FE) MG EC tablet Take 1 tablet (325 mg total) by mouth daily with breakfast. 90 tablet 0   folic acid  (FOLVITE ) 1 MG tablet TAKE 1  TABLET BY MOUTH EVERY DAY 90 tablet 1   lactobacillus acidophilus (BACID) TABS tablet Take 2 tablets by mouth daily at 12 noon. Probiotic     lidocaine  (XYLOCAINE ) 5 % ointment Apply 1 Application topically 3 (three) times daily as needed for mild pain or moderate pain. 35.44 g 3   mirtazapine  (REMERON  SOL-TAB) 30 MG disintegrating tablet TAKE 1 TABLET BY MOUTH EVERYDAY AT BEDTIME (Patient not taking: Reported on 11/21/2023) 90 tablet 1   Multiple Vitamins-Minerals (PRESERVISION AREDS 2) CAPS Take 1 capsule by mouth 2 (two) times daily. (Patient not taking: Reported on 09/24/2023)     Ondansetron  HCl (ZOFRAN  PO) Take 1 tablet by mouth as needed.     pantoprazole  (PROTONIX ) 40 MG tablet Take 1 tablet (40 mg total) by mouth 2 (two) times daily before a meal. 60 tablet 0   prochlorperazine  (COMPAZINE ) 10 MG tablet Take 10 mg  by mouth as needed for nausea or vomiting.     sucralfate  (CARAFATE ) 1 GM/10ML suspension Take 10 mLs (1 g total) by mouth 2 (two) times daily for 14 days. 280 mL 0   sucralfate  (CARAFATE ) 1 GM/10ML suspension Take 10 mLs (1 g total) by mouth 4 (four) times daily -  with meals and at bedtime. Swallow 20 min prior to meals 420 mL 0   tobramycin (TOBREX) 0.3 % ophthalmic solution      No current facility-administered medications for this visit.    SURGICAL HISTORY:  Past Surgical History:  Procedure Laterality Date   BIOPSY  06/18/2022   Procedure: BIOPSY;  Surgeon: San Sandor GAILS, DO;  Location: [facility];  Service: Gastroenterology;;   BIOPSY OF SKIN SUBCUTANEOUS TISSUE AND/OR MUCOUS MEMBRANE  09/03/2023   Procedure: BIOPSY, SKIN, SUBCUTANEOUS TISSUE, OR MUCOUS MEMBRANE;  Surgeon: Wilhelmenia Aloha Raddle., MD;  Location: [facility];  Service: Gastroenterology;;   BREAST BIOPSY Left 03/16/2022   US  LT RADIOACTIVE SEED LOC 03/16/2022 [facility]   BRONCHIAL BIOPSY  10/13/2019   Procedure: BRONCHIAL BIOPSIES;  Surgeon: Brenna Adine CROME, DO;  Location: [facility];   Service: Pulmonary;;   BRONCHIAL BIOPSY  12/04/2022   Procedure: BRONCHIAL BIOPSIES;  Surgeon: Brenna Adine CROME, DO;  Location: [facility];  Service: Pulmonary;;   BRONCHIAL BRUSHINGS  10/13/2019   Procedure: BRONCHIAL BRUSHINGS;  Surgeon: Brenna Adine CROME, DO;  [facility];  Service: Pulmonary;;   BRONCHIAL NEEDLE ASPIRATION BIOPSY  10/13/2019   Procedure: BRONCHIAL NEEDLE ASPIRATION BIOPSIES;  Surgeon: Brenna Adine CROME, DO;  [facility]: [facility];  Service: Pulmonary;;   BRONCHIAL NEEDLE ASPIRATION BIOPSY  04/11/2022   Procedure: BRONCHIAL NEEDLE ASPIRATION BIOPSIES;  Surgeon: Brenna Adine CROME, DO;  Location: [facility];  Service: Cardiopulmonary;;   BRONCHIAL NEEDLE ASPIRATION BIOPSY  12/04/2022   Procedure: BRONCHIAL NEEDLE ASPIRATION BIOPSIES;  Surgeon: Brenna Adine CROME, DO;  [facility];  Service: Pulmonary;;   BRONCHIAL WASHINGS  10/13/2019   Procedure: BRONCHIAL WASHINGS;  Surgeon: Brenna Adine CROME, DO;  [facility];  Service: Pulmonary;;   BRONCHIAL WASHINGS  12/04/2022   Procedure: BRONCHIAL WASHINGS;  Surgeon: Brenna Adine CROME, DO;  [facility];  Service: Pulmonary;;   COLONOSCOPY     multiple   DIVERTING ILEOSTOMY N/A 12/14/2021   Procedure: DIVERTING ILEOSTOMY;  Surgeon: Sheldon Standing, MD;  [facility]: [facility];  Service: General;  Laterality: N/A;   ENDOBRONCHIAL ULTRASOUND Bilateral 04/11/2022   Procedure: ENDOBRONCHIAL ULTRASOUND;  Surgeon: Brenna Adine CROME, DO;  [facility]: [facility];  Service: Cardiopulmonary;  Laterality: Bilateral;   ESOPHAGEAL DILATION  06/18/2022   Procedure: ESOPHAGEAL DILATION;  Surgeon: San Sandor GAILS, DO;  [facility]: [facility];  Service: Gastroenterology;;   ESOPHAGOGASTRODUODENOSCOPY N/A 09/03/2023   Procedure: EGD (ESOPHAGOGASTRODUODENOSCOPY);  Surgeon: Wilhelmenia Aloha Raddle., MD;  Location: [facility];  Service: Gastroenterology;  Laterality: N/A;   ESOPHAGOGASTRODUODENOSCOPY (EGD) WITH PROPOFOL  N/A  06/18/2022   Procedure: ESOPHAGOGASTRODUODENOSCOPY (EGD) WITH PROPOFOL ;  Surgeon: San Sandor GAILS, DO;  [facility]: [facility];  Service: Gastroenterology;  Laterality: N/A;  Severe dysphagia/odynophagia ?radiation esophagitis versus oral candidiasis   FIDUCIAL MARKER PLACEMENT  12/04/2022   Procedure: FIDUCIAL MARKER PLACEMENT;  Surgeon: Brenna Adine CROME, DO;  [facility];  Service: Pulmonary;;   ILEOSTOMY CLOSURE N/A 03/22/2022   Procedure: OPEN TAKEDOWN OF LOOP ILEOSTOMY;  Surgeon: Sheldon Standing, MD;  Location: [facility];  Service: General;  Laterality: N/A;  GEN w/ERAS PATHWAY LOCAL   INTERCOSTAL NERVE BLOCK Left 12/16/2019  Procedure: INTERCOSTAL NERVE BLOCK;  Surgeon: Shyrl Linnie KIDD, MD;  [facility];  Service: Thoracic;  Laterality: Left;   IR FLUORO RM 30-60 MIN  07/02/2022   IR IMAGING GUIDED PORT INSERTION  05/21/2023   KNEE SURGERY  07/20/2008   bilat.   NASAL SEPTUM SURGERY  1995   NODE DISSECTION Left 12/16/2019   Procedure: NODE DISSECTION;  Surgeon: Shyrl Linnie KIDD, MD;  [facility];  Service: Thoracic;  Laterality: Left;   PEG PLACEMENT N/A 07/04/2022   Procedure: PERCUTANEOUS ENDOSCOPIC GASTROSTOMY (PEG) PLACEMENT;  Surgeon: Avram Lupita BRAVO, MD;  Location: [facility];  Service: Gastroenterology;  Laterality: N/A;   RADIOACTIVE SEED GUIDED AXILLARY SENTINEL LYMPH NODE Left 03/20/2022   Procedure: RADIOACTIVE SEED GUIDED LEFT AXILLARY SENTINEL LYMPH NODE BIOPSY;  Surgeon: Vanderbilt Ned, MD;  [facility];  Service: General;  Laterality: Left;   RECTAL EXAM UNDER ANESTHESIA N/A 03/22/2022   Procedure: ANORECTAL EXAMINATION UNDER ANESTHESIA;  Surgeon: Sheldon Standing, MD;  Location: [facility];  Service: General;  Laterality: N/A;   TONSILLECTOMY     TRANSURETHRAL RESECTION OF BLADDER TUMOR  2002   UPPER GASTROINTESTINAL ENDOSCOPY     VIDEO BRONCHOSCOPY  04/11/2022   Procedure: VIDEO BRONCHOSCOPY;  Surgeon: Brenna Adine CROME, DO;  [facility];  Service:  Cardiopulmonary;;   VIDEO BRONCHOSCOPY WITH ENDOBRONCHIAL NAVIGATION N/A 10/13/2019   Procedure: VIDEO BRONCHOSCOPY WITH ENDOBRONCHIAL NAVIGATION;  Surgeon: Brenna Adine CROME, DO;  [facility];  Service: Pulmonary;  Laterality: N/A;   WISDOM TOOTH EXTRACTION     XI ROBOTIC ASSISTED LOWER ANTERIOR RESECTION N/A 12/14/2021   Procedure: XI ROBOTIC ASSISTED LOWER ANTERIOR ULTRA LOW RECTOSIGMOID RESECTION, COLOANAL HAND SEWN ANASTOMOSIS, AND BILATERAL TAP BLOCK;  Surgeon: Sheldon Standing, MD;  Location: [facility];  Service: General;  Laterality: N/A;    REVIEW OF SYSTEMS:  Constitutional: positive for anorexia, fatigue, and weight loss Eyes: negative Ears, nose, mouth, throat, and face: negative Respiratory: positive for dyspnea on exertion Cardiovascular: negative Gastrointestinal: positive for change in bowel habits Genitourinary:negative Integument/breast: negative Hematologic/lymphatic: negative Musculoskeletal:negative Neurological: negative Behavioral/Psych: negative Endocrine: negative Allergic/Immunologic: negative   PHYSICAL EXAMINATION: General appearance: alert, cooperative, fatigued, and no distress Head: Normocephalic, without obvious abnormality, atraumatic Neck: no adenopathy, no JVD, supple, symmetrical, trachea midline, and thyroid  not enlarged, symmetric, no tenderness/mass/nodules Lymph nodes: Cervical, supraclavicular, and axillary nodes normal. Resp: clear to auscultation bilaterally Back: symmetric, no curvature. ROM normal. No CVA tenderness. Cardio: regular rate and rhythm, S1, S2 normal, no murmur, click, rub or gallop GI: soft, non-tender; bowel sounds normal; no masses,  no organomegaly Extremities: extremities normal, atraumatic, no cyanosis or edema Neurologic: Alert and oriented X 3, normal strength and tone. Normal symmetric reflexes. Normal coordination and gait  ECOG PERFORMANCE STATUS: 1 - Symptomatic but completely ambulatory  Blood pressure  117/87, pulse 100, temperature 97.7 F (36.5 C), weight 118 lb 11.2 oz (53.8 kg), SpO2 100%.  LABORATORY DATA: Lab Results  Component Value Date   WBC 6.2 11/14/2023   HGB 12.4 (L) 11/14/2023   HCT 37.0 (L) 11/14/2023   MCV 91.8 11/14/2023   PLT 166 11/14/2023      Chemistry      Component Value Date/Time   NA 134 (L) 11/14/2023 1503   K 4.3 11/14/2023 1503   CL 98 11/14/2023 1503   CO2 32 11/14/2023 1503   BUN 25 (H) 11/14/2023 1503   CREATININE 0.57 (L) 11/14/2023 1503      Component Value Date/Time   CALCIUM   9.6 11/14/2023 1503   ALKPHOS 112 11/14/2023 1503   AST 19 11/14/2023 1503   ALT 16 11/14/2023 1503   BILITOT 0.4 11/14/2023 1503       RADIOGRAPHIC STUDIES: CT CHEST ABDOMEN PELVIS W CONTRAST Result Date: 11/20/2023 CLINICAL DATA:  Recurrent non-small cell lung cancer. Follow-up. * Tracking Code: BO * EXAM: CT CHEST, ABDOMEN, AND PELVIS WITH CONTRAST TECHNIQUE: Multidetector CT imaging of the chest, abdomen and pelvis was performed following the standard protocol during bolus administration of intravenous contrast. RADIATION DOSE REDUCTION: This exam was performed according to the departmental dose-optimization program which includes automated exposure control, adjustment of the mA and/or kV according to patient size and/or use of iterative reconstruction technique. CONTRAST:  80mL OMNIPAQUE  IOHEXOL  300 MG/ML  SOLN COMPARISON:  Multiple priors including CT July 18, 2023 FINDINGS: CT CHEST FINDINGS Cardiovascular: Accessed right chest Port-A-Cath with tip near the superior cavoatrial junction. Aortic atherosclerosis. Three-vessel coronary artery calcifications. Normal size heart. No significant pericardial effusion/thickening. Mediastinum/Nodes: Stable 14 mm left thyroid  nodule this requires no independent imaging follow-up. No pathologically enlarged mediastinal, hilar or axillary lymph nodes. Similar left perihilar soft tissue. Symmetric esophageal wall thickening. Similar  leftward deviation of the mediastinum. Lungs/Pleura: Slight interval decrease in the left perihilar mass like consolidation with air bronchograms and architectural distortion with similar adjacent pleural effusion/thickening. Left apical irregular opacity seen on prior CT has largely resolved now with some linear bands of nodular soft tissue on image 26/4. There is increase in size and number of bilateral solid pulmonary nodules. For reference: -new left upper lobe pulmonary nodule measures 8 mm on image 46/4. -anterior right upper lobe pulmonary nodule measures 4-5 mm on image 24/4 previously 2 mm. -cluster of nodules in the paramedian right lower lobe measure up to 6 mm on image 75/4. Interstitial thickening throughout the right lung. Musculoskeletal: No aggressive lytic or blastic lesion of bone. Diffuse demineralization of bone. CT ABDOMEN PELVIS FINDINGS Hepatobiliary: New ill-defined wedge-shaped enhancement in the lateral left lobe of the liver on image 55/2 is compatible with transient hepatic attenuation difference, nonspecific finding but possibly reflecting a metastasis which is too small to accurately define on today's examination. This warrants attention on follow-up imaging. New ill-defined hypodensity in the inferior left lobe of the liver on image 73/2. Gallbladder is unremarkable. Similar intra and extrahepatic biliary ductal dilation. Pancreas: No pancreatic ductal dilation or evidence of acute inflammation. Spleen: No splenomegaly. Adrenals/Urinary Tract: Left adrenal nodule measures 13 mm on image 61/2 previously 14 mm. No suspicious right adrenal nodule. No hydronephrosis. Kidneys demonstrate symmetric enhancement. Similar perinephric soft tissue/fluid. Urinary bladder is minimally distended limiting evaluation. Stomach/Bowel: No evidence of bowel obstruction. Vascular/Lymphatic: Aortic atherosclerosis. No pathologically enlarged abdominal or pelvic lymph nodes. Reproductive: Prostate is  unremarkable.  Vasectomy clips. Other: No significant abdominopelvic free fluid. Musculoskeletal: No aggressive lytic or blastic lesion of bone. Diffuse demineralization of bone. Multilevel degenerative changes spine. Degenerative changes bilateral hips and SI joints with chronic osseous changes of the pubic symphysis. IMPRESSION: 1. Slight interval decrease in the left perihilar mass like consolidation with air bronchograms and architectural distortion with similar adjacent pleural effusion/thickening, compatible with postradiation change. 2. Increase in size and number of bilateral solid pulmonary nodules, concerning for progressive metastatic disease. 3. New ill-defined hypodensity in the inferior left lobe of the liver, nonspecific but suspicious for metastatic disease. Consider more definitive characterization by abdominal MRI with and without contrast. 4. No area of transient hepatic attenuation difference in the left lobe of  the liver possibly reflecting a hepatic metastasis centrally which is too small to accurately characterize, suggest attention on follow-up imaging. 5. Stable left adrenal nodule. 6.  Aortic Atherosclerosis (ICD10-I70.0). Electronically Signed   By: Reyes Holder M.D.   On: 11/20/2023 16:25     ASSESSMENT AND PLAN: This is a very pleasant 73 years old white male recently diagnosed with a stage IIb (T1 a, N1, M0) non-small cell lung cancer, adenocarcinoma in June 2021 status post left upper lobectomy with lymph node dissection on December 16, 2019 under the care of Dr. Shyrl.  The patient has no actionable mutations and PD-L1 expression was 1%. He was also diagnosed with pelvic GIST tumor. The patient completed a course of adjuvant systemic chemotherapy with cisplatin  75 mg/M2 and Alimta  500 mg/M2 every 3 weeks status post 4 cycles.  He tolerated his treatment well except for fatigue and occasional nausea. For the pelvic GIST tumor, the patient completed neoadjuvant treatment  with imatinib  400 mg p.o. daily in April 2023.  Status post 24 months.   The patient underwent surgical resection of the remaining gastrointestinal stromal tumor under the care of Dr. Sheldon on December 14, 2021 with very minimal residual disease.  He resumed his treatment with imatinib  and tolerating it fairly well. Repeat CT scan of the chest for restaging of his lung cancer. There was increased density of soft tissue in the left hilar region concerning for possible developing lymphadenopathy or disease recurrence.  A PET scan was performed recently and that showed new hypermetabolic left axillary, mediastinal and left hilar adenopathy compatible with recurrent metastatic lung cancer no hypermetabolic metastatic disease in the neck, abdomen, pelvis or skeleton. He underwent excisional biopsy of the left axillary lymph node that was not conclusive for malignancy. The patient was seen by Dr. Brenna and repeat video bronchoscopy with EBUS on April 11, 2022 and biopsy from the station 7 lymph node showed recurrent adenocarcinoma. I had a lengthy discussion with the patient today about his current condition and treatment options. I recommended for the patient a course of concurrent chemoradiation with weekly carboplatin  for AUC of 2 and paclitaxel  45 Mg/M2 for 6-7 weeks.  Status post 3 cycles of chemotherapy but he completed the course of radiation.  The patient was admitted to the hospital on June 07, 2022 with significant sepsis and Klebsiella pneumonia bacteremia.  He continues to have persistent diarrhea despite discontinuation of imatinib  and treatment with cholestyramine .  This is likely functional diarrhea after his surgical resection for the pelvic GIST tumor. Repeat PET scan showed no concerning findings for disease progression except for suspicious slowly growing adenocarcinoma in the right lower lobe but inflammatory changes in the right lung as well as suspicious reactive mediastinal  lymphadenopathy.  The patient had repeat bronchoscopy by Dr. Brenna and the final pathology was consistent with recurrent non-small cell lung cancer. The patient started systemic chemotherapy with carboplatin  for AUC of 5 and Alimta  500 Mg/M2 on 12/25/2022.  Status post 6 cycles.  He is currently on maintenance treatment with single agent Alimta  every 3 weeks status post 6 cycles.  His treatment has been on hold since September 24, 2023 based on his request to take a break of the treatment. He is here today for evaluation with repeat CT scan of the chest, abdomen and pelvis for restaging of his disease.  Unfortunately his scan showed increase in size and number of bilateral solid pulmonary nodules concerning for progressive metastatic disease.  There was also new ill-defined  hypodensity in the inferior left lobe of the liver that nonspecific but still suspicious for metastatic disease and MRI of the abdomen was recommended. Assessment and Plan Assessment & Plan Recurrent metastatic non-small cell lung cancer, adenocarcinoma with hepatic metastases Recurrent non-small cell lung cancer, adenocarcinoma, initially diagnosed in June 2021 as stage 2B. Status post left upper lobectomy with lymph node dissection and systemic chemotherapy. Disease progression with multiple bilateral lung nodules and a suspicious hepatic lesion, likely metastatic. Short interval since last treatment suggests limited efficacy of previous regimens. Current condition includes significant weight loss, fatigue, and anorexia, indicating a decline in overall health status. Further aggressive chemotherapy is unlikely to be beneficial and may cause more harm due to his weakened state. - Discuss prognosis and treatment options, emphasizing the limited benefit and potential harm of further aggressive chemotherapy. - Recommend palliative care and hospice services to manage symptoms and improve quality of life.  Cancer-related anorexia, weight loss,  and fatigue Significant anorexia, weight loss of approximately 6-7 pounds, and fatigue, likely secondary to cancer progression and treatment effects. Current weight is 118.7 pounds, down from 125 pounds. - Address symptoms through palliative care measures to improve comfort and quality of life.  Chronic constipation with frequent stools Reports of frequent stools, not classified as diarrhea, with difficulty in bowel movements. Stool softeners have been used with some relief. Condition may be exacerbated by cancer progression and treatment. - Continue use of stool softeners as needed to manage bowel symptoms.  Palliative care management for advanced cancer Given the advanced stage of cancer and his declining condition, focus is on palliative care to manage symptoms and maintain quality of life. Discussion with him and family about the transition to hospice care to provide comprehensive support at home. He prefers to remain at home as long as possible. - Initiate hospice care services to provide in-home support and symptom management. - Coordinate with hospice team for ongoing care and support. The patient was advised to call if she has any concerning symptoms  The patient voices understanding of current disease status and treatment options and is in agreement with the current care plan.  All questions were answered. The patient knows to call the clinic with any problems, questions or concerns. We can certainly see the patient much sooner if necessary. The total time spent in the appointment was 30 minutes.  Disclaimer: This note was dictated with voice recognition software. Similar sounding words can inadvertently be transcribed and may not be corrected upon review.

## 2023-11-21 NOTE — Telephone Encounter (Signed)
Referral called to [facility] 

## 2023-11-22 ENCOUNTER — Telehealth: Payer: Self-pay | Admitting: Family Medicine

## 2023-11-22 NOTE — Telephone Encounter (Signed)
 This provider agrees that patient does have a terminal illness.  Would be happy to assist hospice team in continuing to care for patient.

## 2023-11-22 NOTE — Telephone Encounter (Signed)
 Copied from CRM #[id number]. Topic: General - Call Back - No Documentation >> Nov 22, 2023  3:22 PM Rea C wrote: Reason for CRM: [name] from [employer] - Received a hospice care referral and patient has admission on Wednesday August 13th at 10:30 am. Patient has requested that Dr. Mercer serves as the hospice attending provider. Need Dr. Mercer to agree that the patient has a terminal illness with a life expectancy of 6 months or less. Does Dr. Mercer want to sign her own comfort orders or defer them to the hospice physician?   Phone: [phone] can call number directly and it is a secured line.

## 2023-11-25 NOTE — Telephone Encounter (Signed)
 Spoke with [facility] from [facility], she is aware

## 2023-11-26 ENCOUNTER — Ambulatory Visit

## 2023-11-26 ENCOUNTER — Encounter: Payer: Self-pay | Admitting: Internal Medicine

## 2023-11-26 ENCOUNTER — Other Ambulatory Visit: Payer: Self-pay | Admitting: Internal Medicine

## 2023-11-26 ENCOUNTER — Ambulatory Visit: Admitting: Internal Medicine

## 2023-11-26 ENCOUNTER — Other Ambulatory Visit

## 2023-11-26 VITALS — BP 86/58 | HR 84 | Ht 68.0 in | Wt 116.0 lb

## 2023-11-26 DIAGNOSIS — K5641 Fecal impaction: Secondary | ICD-10-CM

## 2023-11-26 DIAGNOSIS — K6289 Other specified diseases of anus and rectum: Secondary | ICD-10-CM | POA: Diagnosis not present

## 2023-11-26 NOTE — Progress Notes (Signed)
 Jesse [name] 73 y.o. 10/12/1950 [id number]  Assessment & Plan:   Encounter Diagnoses  Name Primary?   Fecal impaction (HCC) Yes   Rectal pain    I attempted disimpaction today but he really could not tolerate it.  I was able to remove a small portion of the stool.  We are going to attempt a MiraLAX  purge today and then hopefully he can continue daily MiraLAX  after that and have better defecation and less pain.  Hospice should be able to help with palliative measures also.  I can be available as needed.    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  He is accompanied by his wife.  He experiences persistent rectal pain and burning, described as a constant sensation that never stops. He reports burning and , noting that the burning has been present for a long time but was not previously the main problem. The pain is localized around the anus and extends internally. Sitting exacerbates the pain, necessitating the use of a donut cushion, while standing provides some relief.  He has frequent bowel movements, up to twenty times a day, with small amounts of hard stool that are difficult to pass. He uses stool softeners daily, taking two to four as needed, but has not used Miralax  in the past five years. Defecation provides some relief from the burning sensation.  He has progressive lung cancer with increasing size and number of pulmonary nodules and a liver lesion.  He is scheduled to begin hospice care, with an initial appointment set for tomorrow morning. No bleeding from the rectal area is reported, and no other pain besides the rectal discomfort is noted.   Wt Readings from Last 3 Encounters:  11/26/23 116 lb (52.6 kg)  11/21/23 118 lb 11.2 oz (53.8 kg)  09/24/23 125 lb (56.7 kg)     CT chest abdomen pelvis 11/20/2023 IMPRESSION: 1. Slight interval decrease in the left perihilar mass like consolidation with air bronchograms and architectural distortion with similar adjacent pleural effusion/thickening, compatible with postradiation change. 2. Increase in size and number of bilateral solid pulmonary nodules, concerning for progressive metastatic disease. 3. New ill-defined hypodensity in the inferior left lobe of the liver, nonspecific but suspicious for metastatic disease. Consider more definitive characterization by abdominal MRI with and without contrast. 4. No area of transient hepatic attenuation difference in the left lobe of the liver possibly reflecting a hepatic metastasis centrally which is too small to accurately characterize, suggest attention on follow-up imaging. 5. Stable left adrenal nodule. 6.  Aortic Atherosclerosis (ICD10-I70.0).  Lab Results  Component Value Date   WBC 6.2 11/14/2023   HGB 12.4 (L) 11/14/2023   HCT 37.0 (L) 11/14/2023   MCV 91.8 11/14/2023   PLT 166 11/14/2023   Lab Results  Component Value Date   ALT 16 11/14/2023   AST 19 11/14/2023   ALKPHOS 112 11/14/2023   BILITOT 0.4 11/14/2023    No Known Allergies Current Meds  Medication Sig   acetaminophen  (TYLENOL ) 500 MG tablet Take 1,000 mg by mouth every 8 (eight) hours as needed for moderate pain.   Ascorbic Acid  (VITAMIN C ) 1000 MG tablet Take 1,000 mg by mouth daily.   aspirin  EC 81 MG tablet Take 81 mg by mouth daily. Swallow whole.   atorvastatin  (LIPITOR) 20 MG tablet  Take 1 tablet (20 mg total) by mouth daily.   Cholecalciferol  (VITAMIN D3 PO) Take 2,000 Units by mouth daily.   diphenoxylate -atropine  (LOMOTIL ) 2.5-0.025 MG tablet Take 2 tablets by mouth 4 (four) times daily.   ferrous sulfate  325 (65 FE) MG EC tablet Take 1 tablet (325 mg total) by mouth daily with breakfast.   folic acid  (FOLVITE ) 1 MG tablet TAKE 1 TABLET BY MOUTH EVERY DAY    lactobacillus acidophilus (BACID) TABS tablet Take 2 tablets by mouth daily at 12 noon. Probiotic   lidocaine  (XYLOCAINE ) 5 % ointment Apply 1 Application topically 3 (three) times daily as needed for mild pain or moderate pain.   Multiple Vitamins-Minerals (PRESERVISION AREDS 2) CAPS Take 1 capsule by mouth 2 (two) times daily.   Ondansetron  HCl (ZOFRAN  PO) Take 1 tablet by mouth as needed.   pantoprazole  (PROTONIX ) 40 MG tablet Take 1 tablet (40 mg total) by mouth 2 (two) times daily before a meal.   prochlorperazine  (COMPAZINE ) 10 MG tablet Take 10 mg by mouth as needed for nausea or vomiting.   sucralfate  (CARAFATE ) 1 GM/10ML suspension Take 10 mLs (1 g total) by mouth 4 (four) times daily -  with meals and at bedtime. Swallow 20 min prior to meals   tobramycin (TOBREX) 0.3 % ophthalmic solution    Past Medical History:  Diagnosis Date   Anemia    Cancer (HCC)    bladder cancer   COPD (chronic obstructive pulmonary disease) (HCC)    mild    DIVERTICULOSIS, COLON 01/14/2007   Qualifier: Diagnosis of  By: Nunzio RN, Dagoberto Caldron    GASTRITIS, CHRONIC 06/01/2004   Qualifier: Diagnosis of  By: Bettie CMA (AAMA), Robin     GERD 01/09/2007   Qualifier: Diagnosis of  By: Krystal, RN, Ellen     Heart murmur    as a child; no murmur heard 12/14/19   History of radiation therapy    Mediastinum- 05/15/22-06/28/22-Dr. Lynwood Nasuti   HYPERLIPIDEMIA 01/09/2007   Qualifier: Diagnosis of  By: Krystal RN, Leeroy     Hypertension    Lung mass    left upper nodule   Macular degeneration    right eye   NEOPLASM, MALIGNANT, BLADDER, HX OF 2002   Qualifier: Diagnosis of  By: Bettie CMA (AAMA), Robin  / no chemo or radiation   OSTEOARTHRITIS 01/14/2007   Qualifier: Diagnosis of  By: Nunzio, RN, Dagoberto Caldron - knees   Rectal mass    Reflux esophagitis 06/01/2004   Qualifier: Diagnosis of  By: Bettie CMA LEODIS), Robin     Stroke [facility])    2024   TOBACCO USER 02/16/2009   Qualifier: Diagnosis of  By:  Jame  MD, Maude FALCON    VITAMIN D  DEFICIENCY 06/03/2007   Qualifier: Diagnosis of  By: Jame  MD, Maude FALCON    Past Surgical History:  Procedure Laterality Date   BIOPSY  06/18/2022   Procedure: BIOPSY;  Surgeon: San Sandor GAILS, DO;  Location: [facility];  Service: Gastroenterology;;   BIOPSY OF SKIN SUBCUTANEOUS TISSUE AND/OR MUCOUS MEMBRANE  09/03/2023   Procedure: BIOPSY, SKIN, SUBCUTANEOUS TISSUE, OR MUCOUS MEMBRANE;  Surgeon: Wilhelmenia Aloha Raddle., MD;  Location: [facility];  Service: Gastroenterology;;   BREAST BIOPSY Left 03/16/2022   US  LT RADIOACTIVE SEED LOC 03/16/2022 [facility]   BRONCHIAL BIOPSY  10/13/2019   Procedure: BRONCHIAL BIOPSIES;  Surgeon: Brenna Adine CROME, DO;  Location: [facility];  Service: Pulmonary;;   BRONCHIAL BIOPSY  12/04/2022  Procedure: BRONCHIAL BIOPSIES;  Surgeon: Brenna Adine CROME, DO;  [facility]: [facility];  Service: Pulmonary;;   BRONCHIAL BRUSHINGS  10/13/2019   Procedure: BRONCHIAL BRUSHINGS;  Surgeon: Brenna Adine CROME, DO;  [facility];  Service: Pulmonary;;   BRONCHIAL NEEDLE ASPIRATION BIOPSY  10/13/2019   Procedure: BRONCHIAL NEEDLE ASPIRATION BIOPSIES;  Surgeon: Brenna Adine CROME, DO;  [facility]: [facility];  Service: Pulmonary;;   BRONCHIAL NEEDLE ASPIRATION BIOPSY  04/11/2022   Procedure: BRONCHIAL NEEDLE ASPIRATION BIOPSIES;  Surgeon: Brenna Adine CROME, DO;  [facility]: [facility];  Service: Cardiopulmonary;;   BRONCHIAL NEEDLE ASPIRATION BIOPSY  12/04/2022   Procedure: BRONCHIAL NEEDLE ASPIRATION BIOPSIES;  Surgeon: Brenna Adine CROME, DO;  [facility];  Service: Pulmonary;;   BRONCHIAL WASHINGS  10/13/2019   Procedure: BRONCHIAL WASHINGS;  Surgeon: Brenna Adine CROME, DO;  [facility];  Service: Pulmonary;;   BRONCHIAL WASHINGS  12/04/2022   Procedure: BRONCHIAL WASHINGS;  Surgeon: Brenna Adine CROME, DO;  [facility];  Service: Pulmonary;;   COLONOSCOPY     multiple   DIVERTING  ILEOSTOMY N/A 12/14/2021   Procedure: DIVERTING ILEOSTOMY;  Surgeon: Sheldon Standing, MD;  [facility]: [facility];  Service: General;  Laterality: N/A;   ENDOBRONCHIAL ULTRASOUND Bilateral 04/11/2022   Procedure: ENDOBRONCHIAL ULTRASOUND;  Surgeon: Brenna Adine CROME, DO;  [facility]: [facility];  Service: Cardiopulmonary;  Laterality: Bilateral;   ESOPHAGEAL DILATION  06/18/2022   Procedure: ESOPHAGEAL DILATION;  Surgeon: San Sandor GAILS, DO;  [facility];  Service: Gastroenterology;;   ESOPHAGOGASTRODUODENOSCOPY N/A 09/03/2023   Procedure: EGD (ESOPHAGOGASTRODUODENOSCOPY);  Surgeon: Wilhelmenia Aloha Raddle., MD;  Location: [facility];  Service: Gastroenterology;  Laterality: N/A;   ESOPHAGOGASTRODUODENOSCOPY (EGD) WITH PROPOFOL  N/A 06/18/2022   Procedure: ESOPHAGOGASTRODUODENOSCOPY (EGD) WITH PROPOFOL ;  Surgeon: San Sandor GAILS, DO;  [facility];  Service: Gastroenterology;  Laterality: N/A;  Severe dysphagia/odynophagia ?radiation esophagitis versus oral candidiasis   FIDUCIAL MARKER PLACEMENT  12/04/2022   Procedure: FIDUCIAL MARKER PLACEMENT;  Surgeon: Brenna Adine CROME, DO;  [facility];  Service: Pulmonary;;   ILEOSTOMY CLOSURE N/A 03/22/2022   Procedure: OPEN TAKEDOWN OF LOOP ILEOSTOMY;  Surgeon: Sheldon Standing, MD;  Location: [facility];  Service: General;  Laterality: N/A;  GEN w/ERAS PATHWAY LOCAL   INTERCOSTAL NERVE BLOCK Left 12/16/2019   Procedure: INTERCOSTAL NERVE BLOCK;  Surgeon: Shyrl Linnie KIDD, MD;  [facility];  Service: Thoracic;  Laterality: Left;   IR FLUORO RM 30-60 MIN  07/02/2022   IR IMAGING GUIDED PORT INSERTION  05/21/2023   KNEE SURGERY  07/20/2008   bilat.   NASAL SEPTUM SURGERY  1995   NODE DISSECTION Left 12/16/2019   Procedure: NODE DISSECTION;  Surgeon: Shyrl Linnie KIDD, MD;  [facility];  Service: Thoracic;  Laterality: Left;   PEG PLACEMENT N/A 07/04/2022   Procedure: PERCUTANEOUS ENDOSCOPIC GASTROSTOMY (PEG) PLACEMENT;  Surgeon:  Avram Lupita BRAVO, MD;  Location: [facility];  Service: Gastroenterology;  Laterality: N/A;   RADIOACTIVE SEED GUIDED AXILLARY SENTINEL LYMPH NODE Left 03/20/2022   Procedure: RADIOACTIVE SEED GUIDED LEFT AXILLARY SENTINEL LYMPH NODE BIOPSY;  Surgeon: Vanderbilt Ned, MD;  [facility];  Service: General;  Laterality: Left;   RECTAL EXAM UNDER ANESTHESIA N/A 03/22/2022   Procedure: ANORECTAL EXAMINATION UNDER ANESTHESIA;  Surgeon: Sheldon Standing, MD;  Location: [facility];  Service: General;  Laterality: N/A;   TONSILLECTOMY     TRANSURETHRAL RESECTION OF BLADDER TUMOR  2002   UPPER GASTROINTESTINAL ENDOSCOPY     VIDEO BRONCHOSCOPY  04/11/2022  Procedure: VIDEO BRONCHOSCOPY;  Surgeon: Brenna Adine CROME, DO;  [facility]: [facility];  Service: Cardiopulmonary;;   VIDEO BRONCHOSCOPY WITH ENDOBRONCHIAL NAVIGATION N/A 10/13/2019   Procedure: VIDEO BRONCHOSCOPY WITH ENDOBRONCHIAL NAVIGATION;  Surgeon: Brenna Adine CROME, DO;  [facility];  Service: Pulmonary;  Laterality: N/A;   WISDOM TOOTH EXTRACTION     XI ROBOTIC ASSISTED LOWER ANTERIOR RESECTION N/A 12/14/2021   Procedure: XI ROBOTIC ASSISTED LOWER ANTERIOR ULTRA LOW RECTOSIGMOID RESECTION, COLOANAL HAND SEWN ANASTOMOSIS, AND BILATERAL TAP BLOCK;  Surgeon: Sheldon Standing, MD;  Location: [facility];  Service: General;  Laterality: N/A;   Social History   Social History Narrative   Married   3 children: 3 grandchildren; 3 great grandchildren   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   Review of Systems As per HPI.  Objective:   Physical Exam BP (!) 86/58   Pulse 84   Ht 5' 8 (1.727 m)   Wt 116 lb (52.6 kg)   BMI 17.64 kg/m  Chronically ill, asthenic and emaciated, in pain (standing to avoid rectal pain)  Abd tin, nontender  Rectal - perianal erythema - no  external perianal tenderness, no bony tenderness of coccyx or sacrum  DRE - fecal impaction, very tender. Partial disimapction but unable to tolerate further

## 2023-11-26 NOTE — Patient Instructions (Signed)
 Dr Avram recommends that you complete a bowel purge (to clean out your bowels). Please do the following: Purchase a bottle of Miralax  over the counter as well as a box of 5 mg dulcolax tablets. Take 4 dulcolax tablets. Wait 1 hour. You will then drink 6-8 capfuls of Miralax  mixed in an adequate amount of water /juice/gatorade (you may choose which of these liquids to drink) over the next 2-3 hours. You should expect results within 1 to 6 hours after completing the bowel purge.  Do the purge today and then daily take a dose of Miralax .    I appreciate the opportunity to care for you. Lupita Avram, MD, [facility]

## 2023-12-03 ENCOUNTER — Ambulatory Visit: Admitting: Internal Medicine

## 2023-12-03 ENCOUNTER — Ambulatory Visit

## 2023-12-03 ENCOUNTER — Other Ambulatory Visit

## 2024-01-18 ENCOUNTER — Other Ambulatory Visit: Payer: Self-pay | Admitting: Family Medicine

## 2024-04-07 ENCOUNTER — Telehealth: Payer: Self-pay | Admitting: Medical Oncology

## 2024-04-07 NOTE — Telephone Encounter (Signed)
Mr. Debow passed sway on 03/28/2024

## 2024-04-16 DEATH — deceased
